# Patient Record
Sex: Female | Born: 1945
Health system: Southern US, Community
[De-identification: ages and names within clinical notes are randomized; demographics above are authoritative.]

## PROBLEM LIST (undated history)

## (undated) ENCOUNTER — Emergency Department: Admission: EM | Payer: Medicare Other | Source: Home / Self Care

## (undated) DIAGNOSIS — C50919 Malignant neoplasm of unspecified site of unspecified female breast: Secondary | ICD-10-CM

## (undated) DIAGNOSIS — I10 Essential (primary) hypertension: Secondary | ICD-10-CM

## (undated) DIAGNOSIS — R42 Dizziness and giddiness: Secondary | ICD-10-CM

## (undated) DIAGNOSIS — R55 Syncope and collapse: Secondary | ICD-10-CM

## (undated) DIAGNOSIS — R519 Headache, unspecified: Secondary | ICD-10-CM

## (undated) DIAGNOSIS — E785 Hyperlipidemia, unspecified: Secondary | ICD-10-CM

## (undated) DIAGNOSIS — K219 Gastro-esophageal reflux disease without esophagitis: Secondary | ICD-10-CM

## (undated) DIAGNOSIS — I208 Other forms of angina pectoris: Secondary | ICD-10-CM

## (undated) DIAGNOSIS — M545 Low back pain, unspecified: Secondary | ICD-10-CM

## (undated) DIAGNOSIS — I2089 Other forms of angina pectoris: Secondary | ICD-10-CM

## (undated) HISTORY — PX: HYSTERECTOMY: SHX81

## (undated) HISTORY — DX: Hyperlipidemia, unspecified: E78.5

## (undated) HISTORY — PX: UTERINE FIBROID SURGERY: SHX826

## (undated) HISTORY — PX: ABDOMINAL HYSTERECTOMY: SHX81

---

## 1998-11-17 ENCOUNTER — Emergency Department: Admit: 1998-11-17 | Payer: Self-pay | Admitting: Emergency Medicine

## 2001-09-16 ENCOUNTER — Emergency Department: Admit: 2001-09-16 | Payer: Self-pay | Source: Emergency Department | Admitting: Emergency Medicine

## 2003-01-21 ENCOUNTER — Ambulatory Visit
Admit: 2003-01-21 | Disposition: A | Discharge status: Home / Self Care | Payer: Self-pay | Source: Ambulatory Visit | Admitting: Cardiovascular Disease

## 2004-01-23 ENCOUNTER — Ambulatory Visit
Admit: 2004-01-23 | Disposition: A | Discharge status: Home / Self Care | Payer: Self-pay | Source: Ambulatory Visit | Admitting: Family Medicine

## 2004-04-29 ENCOUNTER — Ambulatory Visit
Admit: 2004-04-29 | Disposition: A | Discharge status: Home / Self Care | Payer: Self-pay | Source: Ambulatory Visit | Admitting: Internal Medicine

## 2004-05-01 ENCOUNTER — Ambulatory Visit
Admit: 2004-05-01 | Disposition: A | Discharge status: Home / Self Care | Payer: Self-pay | Source: Ambulatory Visit | Admitting: Internal Medicine

## 2004-08-19 ENCOUNTER — Ambulatory Visit
Admit: 2004-08-19 | Disposition: A | Discharge status: Home / Self Care | Payer: Self-pay | Source: Ambulatory Visit | Admitting: Internal Medicine

## 2004-12-15 ENCOUNTER — Ambulatory Visit
Admit: 2004-12-15 | Disposition: A | Discharge status: Home / Self Care | Payer: Self-pay | Source: Ambulatory Visit | Admitting: Internal Medicine

## 2005-04-26 ENCOUNTER — Ambulatory Visit
Admit: 2005-04-26 | Disposition: A | Discharge status: Home / Self Care | Payer: Self-pay | Source: Ambulatory Visit | Admitting: Internal Medicine

## 2006-01-10 ENCOUNTER — Ambulatory Visit
Admit: 2006-01-10 | Disposition: A | Discharge status: Home / Self Care | Payer: Self-pay | Source: Ambulatory Visit | Admitting: Internal Medicine

## 2006-05-09 ENCOUNTER — Ambulatory Visit
Admit: 2006-05-09 | Disposition: A | Discharge status: Home / Self Care | Payer: Self-pay | Source: Ambulatory Visit | Admitting: Internal Medicine

## 2008-11-24 ENCOUNTER — Ambulatory Visit
Admit: 2008-11-24 | Disposition: A | Discharge status: Home / Self Care | Payer: Self-pay | Source: Ambulatory Visit | Admitting: Internal Medicine

## 2009-03-23 ENCOUNTER — Ambulatory Visit
Admit: 2009-03-23 | Disposition: A | Discharge status: Home / Self Care | Payer: Self-pay | Source: Ambulatory Visit | Admitting: Internal Medicine

## 2009-04-16 ENCOUNTER — Ambulatory Visit
Admit: 2009-04-16 | Disposition: A | Discharge status: Home / Self Care | Payer: Self-pay | Source: Ambulatory Visit | Admitting: Internal Medicine

## 2009-07-15 ENCOUNTER — Emergency Department: Admit: 2009-07-15 | Payer: Self-pay | Source: Emergency Department | Admitting: Emergency Medicine

## 2009-07-15 LAB — COMPREHENSIVE METABOLIC PANEL
ALT: 28 U/L (ref 21–72)
AST (SGOT): 25 U/L (ref 8–39)
Albumin/Globulin Ratio: 1.4 (ref 1.1–1.8)
Albumin: 4.3 G/DL (ref 3.7–5.1)
Alkaline Phosphatase: 99 U/L (ref 43–122)
BUN: 12 MG/DL (ref 7–21)
Bilirubin, Total: <0.1 MG/DL (ref 0.2–1.3)
CO2: 31 MEQ/L (ref 22–31)
Calcium: 9.8 MG/DL (ref 8.6–10.2)
Chloride: 104 MEQ/L (ref 98–107)
Creatinine: 0.8 MG/DL (ref 0.5–1.4)
Globulin: 3 G/DL (ref 2.0–3.7)
Glucose: 108 MG/DL — ABNORMAL HIGH (ref 70–105)
Potassium: 4.4 MEQ/L (ref 3.6–5.0)
Protein, Total: 7.3 G/DL (ref 6.0–8.0)
Sodium: 140 MEQ/L (ref 136–143)

## 2009-07-15 LAB — CBC WITH MANUAL DIFFERENTIAL
Atypical Lymph %: 2
Atypical Lymph Absolute: 0.15
Basophils %: 1 % (ref 0–2)
Basophils Absolute Manual: 0.07
Eosinophils %: 0 % (ref 0–5)
Eosinophils Absolute Manual: 0
Granulocytes #: 4.49
Hematocrit: 41.9 % (ref 37.0–47.0)
Hgb: 13.2 G/DL (ref 12.0–16.0)
LYMPH#: 2.4
Lymphocytes Manual: 32 % (ref 15–41)
MCH: 27.2 PG — ABNORMAL LOW (ref 28.0–32.0)
MCHC: 31.5 G/DL — ABNORMAL LOW (ref 32.0–36.0)
MCV: 86.4 FL (ref 80.0–100.0)
MPV: 12.1 FL (ref 9.4–12.3)
Monocytes Absolute Calculated: 0.37
Monocytes Manual: 5 % (ref 0–11)
Neutrophils %: 60 % (ref 52–75)
Platelets: 319 /mm3 (ref 140–400)
RBC Morphology: NORMAL
RBC: 4.85 /mm3 (ref 4.20–5.40)
RDW: 13.1 % (ref 11.5–15.0)
WBC: 7.49 /mm3 (ref 3.50–10.80)

## 2009-07-15 LAB — TROPONIN I QUANTITATIVE LEVEL CERNER: Troponin I: 0.01 ng/mL (ref 0.00–0.03)

## 2009-07-15 LAB — GFR

## 2009-07-15 LAB — CALCIUM IONIZED-CALC. CERNER: Calcium Ionized Calculated: 2.1 mEQ/L (ref 1.9–2.3)

## 2009-07-15 LAB — CREATINE KINASE W/O REFLEX (SOFT): Creatine Kinase (CK): 78 U/L (ref 20–140)

## 2009-09-04 ENCOUNTER — Ambulatory Visit
Admit: 2009-09-04 | Disposition: A | Discharge status: Home / Self Care | Payer: Self-pay | Source: Ambulatory Visit | Admitting: Internal Medicine

## 2010-09-01 ENCOUNTER — Ambulatory Visit
Admit: 2010-09-01 | Disposition: A | Discharge status: Home / Self Care | Payer: Self-pay | Source: Ambulatory Visit | Admitting: Internal Medicine

## 2011-06-29 LAB — ECG 12-LEAD
Atrial Rate: 55 {beats}/min
P Axis: 45 degrees
P-R Interval: 188 ms
Q-T Interval: 424 ms
QRS Duration: 90 ms
QTC Calculation (Bezet): 405 ms
R Axis: 43 degrees
T Axis: 4 degrees
Ventricular Rate: 55 {beats}/min

## 2011-08-04 ENCOUNTER — Emergency Department: Admit: 2011-08-04 | Disposition: A | Discharge status: Home / Self Care | Payer: Self-pay | Source: Emergency Department

## 2011-08-04 LAB — URINALYSIS, REFLEX TO MICROSCOPIC EXAM IF INDICATED
Bilirubin, UA: NEGATIVE
Blood, UA: NEGATIVE
Glucose, UA: NEGATIVE
Ketones UA: NEGATIVE
Nitrite, UA: NEGATIVE
Protein, UR: NEGATIVE
Specific Gravity UA POCT: 1.005 (ref 1.001–1.035)
Urine pH: 7 (ref 5.0–8.0)
Urobilinogen, UA: NORMAL mg/dL

## 2011-08-04 LAB — CBC AND DIFFERENTIAL
Basophils Absolute Automated: 0.02 10*3/uL (ref 0.00–0.20)
Basophils Automated: 0 % (ref 0–2)
Eosinophils Absolute Automated: 0.23 10*3/uL (ref 0.00–0.70)
Eosinophils Automated: 3 % (ref 0–5)
Hematocrit: 38.4 % (ref 37.0–47.0)
Hgb: 12 g/dL (ref 12.0–16.0)
Immature Granulocytes Absolute: 0.02 10*3/uL
Immature Granulocytes: 0 % (ref 0–1)
Lymphocytes Absolute Automated: 2.98 10*3/uL (ref 0.50–4.40)
Lymphocytes Automated: 34 % (ref 15–41)
MCH: 26.8 pg — ABNORMAL LOW (ref 28.0–32.0)
MCHC: 31.3 g/dL — ABNORMAL LOW (ref 32.0–36.0)
MCV: 85.7 fL (ref 80.0–100.0)
MPV: 11.3 fL (ref 9.4–12.3)
Monocytes Absolute Automated: 0.66 10*3/uL (ref 0.00–1.20)
Monocytes: 8 % (ref 0–11)
Neutrophils Absolute: 4.93 10*3/uL (ref 1.80–8.10)
Neutrophils: 56 % (ref 52–75)
Nucleated RBC: 0 /100 WBC
Platelets: 326 10*3/uL (ref 140–400)
RBC: 4.48 10*6/uL (ref 4.20–5.40)
RDW: 14 % (ref 12–15)
WBC: 8.82 10*3/uL (ref 3.50–10.80)

## 2011-08-04 LAB — COMPREHENSIVE METABOLIC PANEL
ALT: 35 U/L (ref 21–72)
AST (SGOT): 25 U/L (ref 8–39)
Albumin/Globulin Ratio: 1.4 (ref 1.1–1.8)
Albumin: 4.1 g/dL (ref 3.7–5.1)
Alkaline Phosphatase: 115 U/L (ref 43–122)
BUN: 13 mg/dL (ref 7–21)
Bilirubin, Total: 0.4 mg/dL (ref 0.2–1.3)
CO2: 31 mEq/L (ref 22–31)
Calcium: 9.6 mg/dL (ref 8.6–10.2)
Chloride: 102 mEq/L (ref 98–107)
Creatinine: 0.7 mg/dL (ref 0.5–1.4)
Globulin: 3 g/dL (ref 2.0–3.7)
Glucose: 106 mg/dL — ABNORMAL HIGH (ref 70–100)
Potassium: 4.1 mEq/L (ref 3.6–5.0)
Protein, Total: 7.1 g/dL (ref 6.0–8.0)
Sodium: 144 mEq/L — ABNORMAL HIGH (ref 136–143)

## 2011-08-04 LAB — TROPONIN I: Troponin I: <0.01 ng/mL (ref 0.00–0.03)

## 2011-08-04 LAB — GFR: EGFR: >60

## 2011-08-04 LAB — CK: Creatine Kinase (CK): 100 U/L (ref 20–140)

## 2011-08-09 LAB — ECG 12-LEAD
Atrial Rate: 57 {beats}/min
P Axis: 43 degrees
P-R Interval: 174 ms
Q-T Interval: 422 ms
QRS Duration: 92 ms
QTC Calculation (Bezet): 410 ms
R Axis: 50 degrees
T Axis: 9 degrees
Ventricular Rate: 57 {beats}/min

## 2011-10-22 ENCOUNTER — Observation Stay
Admission: EM | Admit: 2011-10-22 | Disposition: A | Discharge status: Home / Self Care | Payer: Self-pay | Source: Emergency Department | Attending: Internal Medicine | Admitting: Internal Medicine

## 2011-10-22 LAB — COMPREHENSIVE METABOLIC PANEL
ALT: 26 U/L (ref 21–72)
AST (SGOT): 27 U/L (ref 8–39)
Albumin/Globulin Ratio: 1.4 (ref 1.1–1.8)
Albumin: 4.4 g/dL (ref 3.7–5.1)
Alkaline Phosphatase: 89 U/L (ref 43–122)
BUN: 12 mg/dL (ref 7–21)
Bilirubin, Total: 0.6 mg/dL (ref 0.2–1.3)
CO2: 32 mEq/L — ABNORMAL HIGH (ref 22–31)
Calcium: 9.7 mg/dL (ref 8.6–10.2)
Chloride: 102 mEq/L (ref 98–107)
Creatinine: 0.7 mg/dL (ref 0.5–1.4)
Globulin: 3.1 g/dL (ref 2.0–3.7)
Glucose: 100 mg/dL (ref 70–100)
Potassium: 4.6 mEq/L (ref 3.6–5.0)
Protein, Total: 7.5 g/dL (ref 6.0–8.0)
Sodium: 140 mEq/L (ref 136–143)

## 2011-10-22 LAB — CBC AND DIFFERENTIAL
Basophils Absolute Automated: 0.02 10*3/uL (ref 0.00–0.20)
Basophils Automated: 0 % (ref 0–2)
Eosinophils Absolute Automated: 0.11 10*3/uL (ref 0.00–0.70)
Eosinophils Automated: 2 % (ref 0–5)
Hematocrit: 40.1 % (ref 37.0–47.0)
Hgb: 12.6 g/dL (ref 12.0–16.0)
Immature Granulocytes Absolute: 0.01 10*3/uL
Immature Granulocytes: 0 % (ref 0–1)
Lymphocytes Absolute Automated: 2.69 10*3/uL (ref 0.50–4.40)
Lymphocytes Automated: 38 % (ref 15–41)
MCH: 27.2 pg — ABNORMAL LOW (ref 28.0–32.0)
MCHC: 31.4 g/dL — ABNORMAL LOW (ref 32.0–36.0)
MCV: 86.6 fL (ref 80.0–100.0)
MPV: 11.5 fL (ref 9.4–12.3)
Monocytes Absolute Automated: 0.48 10*3/uL (ref 0.00–1.20)
Monocytes: 7 % (ref 0–11)
Neutrophils Absolute: 3.85 10*3/uL (ref 1.80–8.10)
Neutrophils: 54 % (ref 52–75)
Nucleated RBC: 0 /100 WBC
Platelets: 360 10*3/uL (ref 140–400)
RBC: 4.63 10*6/uL (ref 4.20–5.40)
RDW: 14 % (ref 12–15)
WBC: 7.15 10*3/uL (ref 3.50–10.80)

## 2011-10-22 LAB — TROPONIN I
Troponin I: 0.01 ng/mL (ref 0.00–0.03)
Troponin I: 0.01 ng/mL (ref 0.00–0.03)

## 2011-10-22 LAB — GFR: EGFR: >60

## 2011-10-23 LAB — ECG 12-LEAD
Atrial Rate: 53 {beats}/min
Atrial Rate: 48 {beats}/min
P Axis: 39 degrees
P Axis: 39 degrees
P-R Interval: 162 ms
P-R Interval: 198 ms
Q-T Interval: 448 ms
Q-T Interval: 444 ms
QRS Duration: 96 ms
QRS Duration: 96 ms
QTC Calculation (Bezet): 420 ms
QTC Calculation (Bezet): 396 ms
R Axis: 42 degrees
R Axis: 40 degrees
T Axis: 19 degrees
T Axis: 22 degrees
Ventricular Rate: 53 {beats}/min
Ventricular Rate: 48 {beats}/min

## 2011-10-24 LAB — URINALYSIS WITH MICROSCOPIC
Bilirubin, UA: NEGATIVE
Blood, UA: NEGATIVE
Glucose, UA: NEGATIVE
Ketones UA: 25 — AB
Nitrite, UA: NEGATIVE
Protein, UR: NEGATIVE
Specific Gravity UA POCT: 1.02 (ref 1.001–1.035)
Urine pH: 6 (ref 5.0–8.0)
Urobilinogen, UA: NORMAL mg/dL

## 2011-10-24 LAB — TSH: TSH: 0.283 u[IU]/mL — ABNORMAL LOW (ref 0.465–4.680)

## 2011-11-07 NOTE — H&P (Signed)
Emily [name], Emily [name]                                        MRN:          [id number]                                                          Account:      [id number]                                         Document ID:  [id number]                                                                                                                                        Admit Date: 10/22/2011     Patient Location: [id number]  Patient Type: V     ATTENDING PHYSICIAN: Neta Mends, MD        CHIEF COMPLAINT:  Chest pain started a couple of days ago.     HISTORY OF PRESENT ILLNESS:  This is a 66 year old female with a past medical history of hypertension,  hyperlipidemia, hypothyroidism, presented to the emergency room for the  complaint of chest pain that started a couple of days ago , pain in the  midsternal area.  Intensity about 6/10 and pressure like, intermittent in  nature, but yesterday the chest pain getting worse, the patient came to the  emergency room, also associated with shortness of breath and nauseated too.   Other than that, no fever, no abdominal pain, no dysuria at this time.  From the emergency room, the patient was admitted to telemetry cardiac  monitor unit to rule out acute coronary syndrome.  Cardiology consultation  requested too.     ALLERGIES:  MORPHINE SULFATE.     HOME MEDICATIONS:  1.  Lisinopril 10 mg p.o. daily.  2.  Simvastatin 40 mg p.o. daily.  3.  MV 1 tablet p.o. daily.     PAST MEDICAL HISTORY:  1.  Hypertension.  2.  Hyperlipidemia.  3.  Hypothyroidism.     PAST SURGICAL HISTORY:  Status post appendectomy in the past and history of hysterectomy in the  past.     FAMILY HISTORY:  Noncontributory.     SOCIAL HISTORY:  No tobacco, no alcohol, no recreational drugs.  Page 1 of 3  Emily [name], Emily [name]                                        MRN:          [id number]                                                           Account:      [id number]                                         Document ID:  [id number]                                                                                                                                           REVIEW OF SYSTEMS:  This is a 66 year old female admitted for chest pain, other than that, no  fever and no dysuria at this time.     PHYSICAL EXAMINATION:  GENERAL:  The patient lying on the bed, not in distress.  VITAL SIGNS:  Blood pressure 163/73, heart rate 49, respirations 16,  temperature 97.8, oxygen saturation 100% on 2 liters.  HEENT:  Pupils round and reactive to light.  Nose:  No discharge.  Mouth:  Mucous membranes moist.  NECK:  Supple, no JVD, no carotid bruits.  SKIN:  No rash.  No cyanosis.  LUNGS:  Good air entry bilaterally.  No significant expiratory wheezing.  CARDIOVASCULAR:  S1, S2.  ABDOMEN:  Soft, bowel sounds present.  No mass palpable.  No rebound  tenderness.  RECTAL:  Refused.  EXTREMITIES:  No edema.     LABORATORY DATA:  WBC 7.15, hemoglobin 12.2, hematocrit 40.1, MCV 86.6, platelets 360.  Sodium 142, potassium 4.6, chloride 102, bicarbonate 32, BUN 12, creatinine  0.7, glucose 100.  Troponin 0.01.     Chest x-ray:  No acute process.     IMPRESSION:  1.  Chest pain to rule out acute coronary syndrome.  2.  Hypertension.  3.  Hyperlipidemia.  4.  Hypothyroidism.     PLAN:  This is a 66 year old female, history of hypertension, hyperlipidemia,  admitted for chest pain.  Plan to admit to telemetry cardiac monitor unit.  We will send serial cardiac enzymes for 2 to 3 times and will follow.  Cardiology consultation requested.  We will follow.  Plan to do a cardiac  stress test today too.  Further management depends on the patient's  clinical course.  Electronic Signing Provider                                                                                                              Page 2 of 3  Emily [name], Emily [name]                                         MRN:          [id number]                                                          Account:      [id number]                                         Document ID:  [id number]                                                                                                                                        D:  10/23/2011 08:15 AM by Dr. Marcelino Freestone, MD ([id number])  T:  10/23/2011 11:20 AM by [id number]        cc:                                                                                                           Page 3 of 3  Authenticated and Edited by Marcelino Freestone, MD ([id number]) On 11/07/11 1:52:58 PM

## 2011-11-21 NOTE — Discharge Summary (Signed)
Emily [name], Emily [name]                                          MRN:          [id number]                                                          Account:      [id number]                                         Document ID:  [id number]                                                                                                                                        Admit Date: 10/22/2011  Discharge Date: 10/25/2011     ATTENDING PHYSICIAN:  Karrie Meres, MD        DISCHARGE DIAGNOSES:  Chest pain, myocardial infarction ruled out, urinary tract infection,  hypothyroidism, hypertension, dyslipidemia, and possible gastroesophageal  reflux disease.     HISTORY OF PRESENT ILLNESS:  The patient is a 66 year old lady who came to the hospital because of  having midsternal chest discomfort.  The patient said that she has had  chest discomfort for the last couple of days it was 6/10 in severity,  pressure-like, intermittent in nature, associated with some nausea as well.   She was admitted for further evaluation and management.     HOSPITAL COURSE:  During the hospital course, patient initially had laboratory tests done,  which showed white count of 7, H and H 12/40, platelets 360, BUN 12,  creatinine 0.7.  Chest x-ray was negative.  The patient had a cardiac  stress test done, which was negative.  The patient still continued to  complain of weakness.  The patient's TSH was done, which was low,  therefore, Synthroid was decreased to 25 daily from 50 daily.  The  patient's UA also showed some leukesterase; therefore, she has been put on  Macrobid twice a day.  The patient said that she feels better and that she  would be discharged home.     DISCHARGE MEDICATIONS:  Lisinopril 10 daily, simvastatin 40 daily, Synthroid 25 daily, Pepcid 20  daily, Macrobid 100 twice a day for 7 days.     DISCHARGE INSTRUCTIONS:  The patient will get TSH checked in 4 weeks and will follow up with PMD in  2 weeks.  D:  10/25/2011 11:59  AM by Dr. Debera Lat. Clayborn Heron, MD ([id number])  T:  10/25/2011 12:34 PM by [id number]                                                                                                                 Page 1 of 2  Emily, [name]                                          MRN:          [id number]                                                          Account:      [id number]                                         Document ID:  [id number]                                                                                                                                           cc:                                                                                                           Page 2 of 2  Authenticated by Debera Lat. Clayborn Heron, MD ([id number]) On 11/21/2011 03:44:46 PM

## 2011-12-16 ENCOUNTER — Ambulatory Visit
Admit: 2011-12-16 | Discharge: 2011-12-16 | Disposition: A | Discharge status: Home / Self Care | Payer: Self-pay | Source: Ambulatory Visit

## 2012-10-03 DIAGNOSIS — C50919 Malignant neoplasm of unspecified site of unspecified female breast: Secondary | ICD-10-CM

## 2012-10-03 HISTORY — DX: Malignant neoplasm of unspecified site of unspecified female breast: C50.919

## 2012-11-05 ENCOUNTER — Other Ambulatory Visit: Payer: Self-pay | Admitting: Family Medicine

## 2012-11-05 DIAGNOSIS — Z1231 Encounter for screening mammogram for malignant neoplasm of breast: Secondary | ICD-10-CM

## 2012-11-09 ENCOUNTER — Ambulatory Visit: Admission: RE | Admit: 2012-11-09 | Payer: Commercial Managed Care - POS | Source: Ambulatory Visit

## 2013-08-12 ENCOUNTER — Other Ambulatory Visit: Payer: Self-pay | Admitting: Family Medicine

## 2013-08-12 DIAGNOSIS — Z1231 Encounter for screening mammogram for malignant neoplasm of breast: Secondary | ICD-10-CM

## 2013-08-14 ENCOUNTER — Ambulatory Visit
Admission: RE | Admit: 2013-08-14 | Discharge: 2013-08-14 | Disposition: A | Discharge status: Home / Self Care | Payer: Commercial Managed Care - POS | Source: Ambulatory Visit | Attending: Family Medicine | Admitting: Family Medicine

## 2013-08-14 DIAGNOSIS — R920 Mammographic microcalcification found on diagnostic imaging of breast: Secondary | ICD-10-CM | POA: Insufficient documentation

## 2013-08-14 DIAGNOSIS — Z1231 Encounter for screening mammogram for malignant neoplasm of breast: Secondary | ICD-10-CM

## 2013-08-15 ENCOUNTER — Other Ambulatory Visit: Payer: Self-pay | Admitting: Family Medicine

## 2013-08-15 ENCOUNTER — Telehealth: Payer: Self-pay | Admitting: Family Medicine

## 2013-08-15 DIAGNOSIS — R920 Mammographic microcalcification found on diagnostic imaging of breast: Secondary | ICD-10-CM

## 2013-08-26 ENCOUNTER — Other Ambulatory Visit: Payer: Commercial Managed Care - POS

## 2013-08-27 ENCOUNTER — Ambulatory Visit: Payer: Medicare Other

## 2013-09-02 ENCOUNTER — Other Ambulatory Visit: Payer: Self-pay | Admitting: Family Medicine

## 2013-09-02 ENCOUNTER — Ambulatory Visit
Admission: RE | Admit: 2013-09-02 | Discharge: 2013-09-02 | Disposition: A | Discharge status: Home / Self Care | Payer: Commercial Managed Care - POS | Source: Ambulatory Visit | Attending: Family Medicine | Admitting: Family Medicine

## 2013-09-02 ENCOUNTER — Ambulatory Visit: Payer: Self-pay

## 2013-09-02 DIAGNOSIS — R920 Mammographic microcalcification found on diagnostic imaging of breast: Secondary | ICD-10-CM

## 2013-09-02 DIAGNOSIS — N63 Unspecified lump in unspecified breast: Secondary | ICD-10-CM

## 2013-09-02 MED ORDER — LIDOCAINE HCL 2 % IJ SOLN
INTRAMUSCULAR | Status: AC
Start: 2013-09-02 — End: 2013-09-02
  Administered 2013-09-02: 10 mL via INTRAMUSCULAR
  Filled 2013-09-02: qty 40

## 2013-09-04 LAB — LAB USE ONLY - HISTORICAL SURGICAL PATHOLOGY

## 2013-09-24 ENCOUNTER — Other Ambulatory Visit: Payer: Self-pay | Admitting: Surgery

## 2013-09-24 DIAGNOSIS — C50919 Malignant neoplasm of unspecified site of unspecified female breast: Secondary | ICD-10-CM

## 2013-10-03 HISTORY — PX: MASTECTOMY: SHX3

## 2014-04-17 ENCOUNTER — Inpatient Hospital Stay: Payer: Commercial Managed Care - POS | Admitting: Family Medicine

## 2014-04-17 ENCOUNTER — Emergency Department: Payer: Commercial Managed Care - POS

## 2014-04-17 ENCOUNTER — Inpatient Hospital Stay
Admission: EM | Admit: 2014-04-17 | Discharge: 2014-04-23 | DRG: 092 | Disposition: A | Discharge status: Home / Self Care | Payer: Commercial Managed Care - POS | Attending: Internal Medicine | Admitting: Internal Medicine

## 2014-04-17 DIAGNOSIS — Z7982 Long term (current) use of aspirin: Secondary | ICD-10-CM

## 2014-04-17 DIAGNOSIS — N61 Mastitis without abscess: Secondary | ICD-10-CM | POA: Diagnosis present

## 2014-04-17 DIAGNOSIS — I10 Essential (primary) hypertension: Secondary | ICD-10-CM | POA: Diagnosis present

## 2014-04-17 DIAGNOSIS — Z853 Personal history of malignant neoplasm of breast: Secondary | ICD-10-CM

## 2014-04-17 DIAGNOSIS — N390 Urinary tract infection, site not specified: Secondary | ICD-10-CM | POA: Diagnosis present

## 2014-04-17 DIAGNOSIS — T8140XA Infection following a procedure, unspecified, initial encounter: Secondary | ICD-10-CM | POA: Diagnosis present

## 2014-04-17 DIAGNOSIS — G929 Unspecified toxic encephalopathy: Principal | ICD-10-CM | POA: Diagnosis present

## 2014-04-17 DIAGNOSIS — E519 Thiamine deficiency, unspecified: Secondary | ICD-10-CM | POA: Diagnosis present

## 2014-04-17 DIAGNOSIS — E039 Hypothyroidism, unspecified: Secondary | ICD-10-CM | POA: Diagnosis present

## 2014-04-17 DIAGNOSIS — K219 Gastro-esophageal reflux disease without esophagitis: Secondary | ICD-10-CM | POA: Diagnosis present

## 2014-04-17 DIAGNOSIS — D649 Anemia, unspecified: Secondary | ICD-10-CM | POA: Diagnosis present

## 2014-04-17 DIAGNOSIS — R4182 Altered mental status, unspecified: Secondary | ICD-10-CM

## 2014-04-17 DIAGNOSIS — E785 Hyperlipidemia, unspecified: Secondary | ICD-10-CM | POA: Diagnosis present

## 2014-04-17 HISTORY — DX: Essential (primary) hypertension: I10

## 2014-04-17 LAB — CBC AND DIFFERENTIAL
Basophils Absolute Automated: 0.02 10*3/uL (ref 0.00–0.20)
Basophils Automated: 0 %
Eosinophils Absolute Automated: 0 10*3/uL (ref 0.00–0.70)
Eosinophils Automated: 0 %
Hematocrit: 30.1 % — ABNORMAL LOW (ref 37.0–47.0)
Hgb: 9.2 g/dL — ABNORMAL LOW (ref 12.0–16.0)
Immature Granulocytes Absolute: 0.04 10*3/uL
Immature Granulocytes: 0 %
Lymphocytes Absolute Automated: 2.15 10*3/uL (ref 0.50–4.40)
Lymphocytes Automated: 24 %
MCH: 26.1 pg — ABNORMAL LOW (ref 28.0–32.0)
MCHC: 30.6 g/dL — ABNORMAL LOW (ref 32.0–36.0)
MCV: 85.5 fL (ref 80.0–100.0)
MPV: 10 fL (ref 9.4–12.3)
Monocytes Absolute Automated: 0.75 10*3/uL (ref 0.00–1.20)
Monocytes: 8 %
Neutrophils Absolute: 6.12 10*3/uL (ref 1.80–8.10)
Neutrophils: 68 %
Nucleated RBC: 0 /100 WBC (ref 0–1)
Platelets: 496 10*3/uL — ABNORMAL HIGH (ref 140–400)
RBC: 3.52 10*6/uL — ABNORMAL LOW (ref 4.20–5.40)
RDW: 14 % (ref 12–15)
WBC: 9.04 10*3/uL (ref 3.50–10.80)

## 2014-04-17 LAB — URINALYSIS, REFLEX TO MICROSCOPIC EXAM IF INDICATED
Bilirubin, UA: NEGATIVE
Blood, UA: NEGATIVE
Glucose, UA: NEGATIVE
Ketones UA: NEGATIVE
Nitrite, UA: NEGATIVE
Protein, UR: NEGATIVE
Specific Gravity UA: 1.004 (ref 1.001–1.035)
Urine pH: 7 (ref 5.0–8.0)
Urobilinogen, UA: NEGATIVE mg/dL

## 2014-04-17 LAB — LIPASE: Lipase: 24 U/L (ref 8–78)

## 2014-04-17 LAB — COMPREHENSIVE METABOLIC PANEL
ALT: 25 U/L (ref 0–55)
AST (SGOT): 36 U/L — ABNORMAL HIGH (ref 5–34)
Albumin/Globulin Ratio: 1.1 (ref 0.9–2.2)
Albumin: 3.4 g/dL — ABNORMAL LOW (ref 3.5–5.0)
Alkaline Phosphatase: 87 U/L (ref 37–106)
BUN: 11 mg/dL (ref 7–19)
Bilirubin, Total: 0.2 mg/dL (ref 0.2–1.2)
CO2: 33 mEq/L — ABNORMAL HIGH (ref 22–29)
Calcium: 9.3 mg/dL (ref 8.5–10.5)
Chloride: 101 mEq/L (ref 100–111)
Creatinine: 0.7 mg/dL (ref 0.6–1.0)
Globulin: 3.1 g/dL (ref 2.0–3.6)
Glucose: 104 mg/dL — ABNORMAL HIGH (ref 70–100)
Potassium: 4.3 mEq/L (ref 3.5–5.1)
Protein, Total: 6.5 g/dL (ref 6.0–8.3)
Sodium: 143 mEq/L (ref 136–145)

## 2014-04-17 LAB — MAGNESIUM: Magnesium: 2.2 mg/dL (ref 1.6–2.6)

## 2014-04-17 LAB — TROPONIN I: Troponin I: <0.01 ng/mL (ref 0.00–0.09)

## 2014-04-17 LAB — APTT: PTT: 34 s (ref 23–37)

## 2014-04-17 LAB — PT/INR
PT INR: 1 (ref 0.9–1.1)
PT: 12.9 s (ref 12.6–15.0)

## 2014-04-17 LAB — GFR: EGFR: >60

## 2014-04-17 LAB — LACTIC ACID, PLASMA: Lactic Acid: 1.2 mmol/L (ref 0.5–2.2)

## 2014-04-17 MED ORDER — LEVOFLOXACIN IN D5W 500 MG/100ML IV SOLN
500.0000 mg | INTRAVENOUS | Status: DC
Start: 2014-04-18 — End: 2014-04-19
  Administered 2014-04-18: 500 mg via INTRAVENOUS
  Filled 2014-04-17 (×2): qty 100

## 2014-04-17 MED ORDER — ASPIRIN 81 MG PO TBEC
81.0000 mg | DELAYED_RELEASE_TABLET | Freq: Every day | ORAL | Status: DC
Start: 2014-04-17 — End: 2014-04-23
  Administered 2014-04-18 – 2014-04-23 (×7): 81 mg via ORAL
  Filled 2014-04-17 (×7): qty 1

## 2014-04-17 MED ORDER — VANCOMYCIN HCL 1000 MG IV SOLR
1000.0000 mg | Freq: Two times a day (BID) | INTRAVENOUS | Status: DC
Start: 2014-04-17 — End: 2014-04-22
  Administered 2014-04-17 – 2014-04-22 (×10): 1000 mg via INTRAVENOUS
  Filled 2014-04-17 (×12): qty 1000

## 2014-04-17 MED ORDER — VANCOMYCIN HCL 1000 MG IV SOLR
1000.0000 mg | Freq: Once | INTRAVENOUS | Status: DC
Start: 2014-04-17 — End: 2014-04-17

## 2014-04-17 MED ORDER — POTASSIUM CHLORIDE IN NACL 20-0.9 MEQ/L-% IV SOLN
INTRAVENOUS | Status: DC
Start: 2014-04-17 — End: 2014-04-21
  Administered 2014-04-18 – 2014-04-19 (×2): 75 mL/h via INTRAVENOUS

## 2014-04-17 MED ORDER — LEVOFLOXACIN IN D5W 500 MG/100ML IV SOLN
500.0000 mg | Freq: Once | INTRAVENOUS | Status: AC
Start: 2014-04-17 — End: 2014-04-17
  Administered 2014-04-17: 500 mg via INTRAVENOUS
  Filled 2014-04-17: qty 100

## 2014-04-17 NOTE — ED Notes (Signed)
Pt A&O X3.VSS.Pt transferred to unit 6B

## 2014-04-17 NOTE — ED Notes (Signed)
breast surgery on Sunday to remove expanders due to possible infection then pt started having memory problems and was sent here to be checked for UTI, blood clot and or mental evaluation

## 2014-04-17 NOTE — ED Notes (Signed)
Attempted IV x2. No success. RN notified.

## 2014-04-17 NOTE — ED Provider Notes (Signed)
Physician/Midlevel provider first contact with patient: 04/17/14 1718         EMERGENCY DEPARTMENT NOTE    Physician/Midlevel provider first contact with patient: 04/17/14 1718         HISTORY OF PRESENT ILLNESS   Historian:Patient  Translator Used: No    68 y.o. female h/o htn, breast cancer brought here by daughter and son because of memory loss since surgery to remove right breast expanders at [facility] 4 days ago. No fever. Patient has not been taking any medications except for asa. Patient could not remember why she had the surgery, but no other focal deficits.     I spoke with Dr. Chelsea Aus, plastic surgeon who performed the surgery 4 days ago at [facility], per him pt should have started on cipro and Duricef po as outpatient, but per family, she has not filled any of the discharge prescriptions because they were "narcotics." Additionally, pt's wound culture from surgery were normal and her previous blood cultures had grown Serratia which was sensitive to cipro. Rubye Strohmeyer can be reached at [phone].     1. Location of symptoms: neuro  2. Onset of symptoms: 4 days  3. What was patient doing when symptoms started (Context): s/p surgery  4. Severity: moderate  5. Timing: constant  6. Activities that worsen symptoms: none  7. Activities that improve symptoms: none  8. Quality:   9. Radiation of symptoms:  10. Associated signs and Symptoms: memory loss  11. Are symptoms worsening?no  MEDICAL HISTORY     Past Medical History:  Past Medical History   Diagnosis Date   . Hypertension    . Breast cancer in female, unspecified laterality        Past Surgical History:  History reviewed. No pertinent past surgical history.    Social History:  History     Social History   . Marital Status: Widowed     Spouse Name: N/A     Number of Children: N/A   . Years of Education: N/A     Occupational History   . Not on file.     Social History Main Topics   . Smoking status: Never Smoker    . Smokeless tobacco: Not on file   .  Alcohol Use: No   . Drug Use: Not on file   . Sexual Activity: Not on file     Other Topics Concern   . Not on file     Social History Narrative       Family History:  Family History   Problem Relation Age of Onset   . Breast cancer Neg Hx        Outpatient Medication:  Current Discharge Medication List      CONTINUE these medications which have NOT CHANGED    Details   lisinopril (PRINIVIL,ZESTRIL) 10 MG tablet Take 10 mg by mouth daily.      omeprazole (PRILOSEC) 10 MG capsule Take 20 mg by mouth daily.      aspirin 81 MG EC tablet Take 81 mg by mouth daily.      oxyCODONE (OXY-IR) 5 MG capsule Take 5 mg by mouth every 6 (six) hours as needed.               REVIEW OF SYSTEMS   ADD ROS  Review of Systems   Psychiatric/Behavioral: Positive for memory loss.   All other systems reviewed and are negative.          PHYSICAL EXAM  Filed Vitals:    04/17/14 2141   BP: 142/67   Pulse: 80   Temp: 97.8 F (36.6 C)   Resp: 18   SpO2: 100%     Nursing note and vitals reviewed.  Constitutional:  Well developed, well nourished.   Head:  Atraumatic. Normocephalic.    Eyes:  PERRL. EOMI. Conjunctivae are not pale.  ENT:  Mucous membranes are moist and intact. Oropharynx is clear and symmetric.  Patent airway.  Neck:  Supple. Full ROM.    Cardiovascular:  Regular rate. Regular rhythm. No murmurs, rubs, or gallops.  Respiratory:  No evidence of respiratory distress. Clear to auscultation bilaterally.  right breast surgical site, intact, tender, no drainage, JP drain has about 15 cc of serosanguinous fluid.   GI:  Soft and non-distended. There is no tenderness. No rebound, guarding, or rigidity.  Back:  Full ROM. Nontender.  MSK:  No edema. No cyanosis. No clubbing. Full range of motion in all extremities.  Skin:  Skin is warm and dry.  No diaphoresis. No rash.   Neurological:  Alert, awake, and appropriate. Oriented to person, place, but not time. Normal speech. Motor normal.  Psychiatric:  Good eye contact. Normal interaction,  affect, and behavior.        MEDICAL DECISION MAKING     DISCUSSION      R/o ich, r/o electrolyte, r/o infection, will cover with abx      tpa considered, outside of window and unlikely cva      Vital Signs: Reviewed the patient?s vital signs.   Nursing Notes: Reviewed and utilized available nursing notes.  Medical Records Reviewed: Reviewed available past medical records.      PROCEDURES        CARDIAC STUDIES    The following cardiac studies were independently interpreted by the Emergency Medicine Physician.  For full cardiac study results please see chart.    Monitor Strip  Interpreted by ED Physician  Rate: 80  Rhythm: SR      EKG Interpretation:    17:54 NSR at 83 bpm, NAD, no LVH, qrs 88, qtc 430 (-) ST-T changes similar to ekg on 10/23/2011 as read by me.     IMAGING STUDIES    The following imaging studies were independently interpreted by the Emergency Medicine Physician.  For full imaging study results please see chart.    CT Head without Contrast   Final Result   HISTORY: Mental status change      EXAMINATION AND TECHNIQUE: Unenhanced CT examination of brain performed.         COMPARISON: None available      FINDINGS: No mass-effect or midline shift on these unenhanced images.   Ventricular size within normal limits. No CT evidence of acute   intra-axial fluid collections to suggest acute hemorrhage. Osseous   structures are intact. The visualized paranasal sinuses are clear.       IMPRESSION:     No CT evidence of acute process.      Ivin Poot, MD    04/17/2014 7:59 PM         XR Chest AP Portable   Final Result   HISTORY: Mental status change with clinical concern for pneumonia      EXAMINATION: Portable frontal view of chest at 1757      COMPARISON: 10/22/2011       FINDINGS: Patient partially rotated to the right. Surgical clips are   present overlying the right mid to lower hemithorax.  Thin radiopaque   tubular device overlies this region, likely external to the patient. No   focal consolidation or  effusions. No acute edema. Cardiac silhouette   enlarged but stable. No other interval change.      IMPRESSION:        1. Postsurgical changes of right chest as discussed above.   2. No radiographic evidence of pneumonia.      Ivin Poot, MD    04/17/2014 6:08 PM                PULSE OXIMETRY    Oxygen Saturation by Pulse Oximetry: 100%  Interventions: none  Interpretation: normal    EMERGENCY DEPT. MEDICATIONS      ED Medication Orders    Start     Status Ordering Provider    04/17/14 1931  levofloxacin (LEVAQUIN) 500mg  in D5W IVPB (premix)   Once     Route: Intravenous  Ordered Dose: 500 mg     Last MAR action:  Stopped Sharniece Gibbon SHU    04/17/14 1931     Once,   Status:  Discontinued     Route: Intravenous  Ordered Dose: 1,000 mg     Discontinued Halena Mohar SHU          LABORATORY RESULTS    Ordered and independently interpreted AVAILABLE laboratory tests. Please see results section in chart for full details.  Results for orders placed or performed during the hospital encounter of 04/17/14   CBC and differential   Result Value Ref Range    WBC 9.04 3.50 - 10.80 x10 3/uL    RBC 3.52 (L) 4.20 - 5.40 x10 6/uL    Hgb 9.2 (L) 12.0 - 16.0 g/dL    Hematocrit 16.1 (L) 37.0 - 47.0 %    MCV 85.5 80.0 - 100.0 fL    MCH 26.1 (L) 28.0 - 32.0 pg    MCHC 30.6 (L) 32.0 - 36.0 g/dL    RDW 14 12 - 15 %    Platelets 496 (H) 140 - 400 x10 3/uL    MPV 10.0 9.4 - 12.3 fL    Neutrophils 68 None %    Lymphocytes Automated 24 None %    Monocytes 8 None %    Eosinophils Automated 0 None %    Basophils Automated 0 None %    Immature Granulocyte 0 None %    Nucleated RBC 0 0 - 1 /100 WBC    Neutrophils Absolute 6.12 1.80 - 8.10 x10 3/uL    Abs Lymph Automated 2.15 0.50 - 4.40 x10 3/uL    Abs Mono Automated 0.75 0.00 - 1.20 x10 3/uL    Abs Eos Automated 0.00 0.00 - 0.70 x10 3/uL    Absolute Baso Automated 0.02 0.00 - 0.20 x10 3/uL    Absolute Immature Granulocyte 0.04 0 x10 3/uL   Comprehensive metabolic panel   Result Value Ref  Range    Glucose 104 (H) 70 - 100 mg/dL    BUN 11 7 - 19 mg/dL    Creatinine 0.7 0.6 - 1.0 mg/dL    Sodium 096 045 - 409 mEq/L    Potassium 4.3 3.5 - 5.1 mEq/L    Chloride 101 100 - 111 mEq/L    CO2 33 (H) 22 - 29 mEq/L    CALCIUM 9.3 8.5 - 10.5 mg/dL    Protein, Total 6.5 6.0 - 8.3 g/dL    Albumin 3.4 (L) 3.5 - 5.0 g/dL  AST (SGOT) 36 (H) 5 - 34 U/L    ALT 25 0 - 55 U/L    Alkaline Phosphatase 87 37 - 106 U/L    Bilirubin, Total 0.2 0.2 - 1.2 mg/dL    Globulin 3.1 2.0 - 3.6 g/dL    Albumin/Globulin Ratio 1.1 0.9 - 2.2   Lipase   Result Value Ref Range    Lipase 24 8 - 78 U/L   UA, Reflex to Microscopic   Result Value Ref Range    Urine Type Clean Catch     Color, UA Straw Clear - Yellow    Clarity, UA Clear Clear - Hazy    Specific Gravity UA 1.004 1.001-1.035    Urine pH 7.0 5.0-8.0    Leukocyte Esterase, UA Small (A) Negative    Nitrite, UA Negative Negative    Protein, UR Negative Negative    Glucose, UA Negative Negative    Ketones UA Negative Negative    Urobilinogen, UA Negative 0.2  -  2.0 mg/dL    Bilirubin, UA Negative Negative    Blood, UA Negative Negative    RBC, UA 0 - 5 0 - 5 /hpf    WBC, UA 6 - 10 (A) 0 - 5 /hpf    Squamous Epithelial Cells, Urine 0 - 5 0 - 25 /hpf    Trans Epithel, UA 0 - 2 0 - 2 /hpf    Urine Bacteria Rare Rare /hpf   Troponin I   Result Value Ref Range    Troponin I <0.01 0.00 - 0.09 ng/mL   APTT   Result Value Ref Range    PTT 34 23 - 37 sec   Prothrombin time/INR   Result Value Ref Range    PT 12.9 12.6 - 15.0 sec    PT INR 1.0 0.9 - 1.1    PT Anticoag. Given Within 48 hrs. None    Magnesium   Result Value Ref Range    Magnesium 2.2 1.6 - 2.6 mg/dL   Lactic acid, plasma   Result Value Ref Range    Lactic acid 1.2 0.5 - 2.2 mmol/L   GFR   Result Value Ref Range    EGFR >60.0        CONSULTATIONS and ED Course      Case discussed with Dr. Francesco Sor, neuro, will consult.     Case discussed with Dr. Bolivar Haw, will admit.     .Patient feels better. I have discussed all testing results and  plan of care with patient. Patient agrees with admission.     ATTESTATIONS      Physician Attestation: I, Dr. Zadie Rhine Veda Arrellano, MD PhD , have been the primary provider for [name] during this Emergency Dept visit and have reviewed the chart documented for accuracy and agree with its content.       DIAGNOSIS      Diagnosis:  Final diagnoses:   Altered mental status, unspecified altered mental status type   UTI (lower urinary tract infection)       Disposition:  ED Disposition    Observation Admitting Physician: BORN, ANA MARIA [[id number]]  Diagnosis: Altered mental status [780.97]  Estimated Length of Stay: < 2 midnights  Tentative Discharge Plan?: Home or Self Care [1]  Patient Class: Observation [104]            Prescriptions:  Current Discharge Medication List      CONTINUE these medications which have NOT CHANGED    Details  lisinopril (PRINIVIL,ZESTRIL) 10 MG tablet Take 10 mg by mouth daily.      omeprazole (PRILOSEC) 10 MG capsule Take 20 mg by mouth daily.      aspirin 81 MG EC tablet Take 81 mg by mouth daily.      oxyCODONE (OXY-IR) 5 MG capsule Take 5 mg by mouth every 6 (six) hours as needed.                 Seville Brick, Zadie Rhine, MD [facility]  04/17/14 [phone]

## 2014-04-18 ENCOUNTER — Inpatient Hospital Stay: Payer: Commercial Managed Care - POS

## 2014-04-18 LAB — CBC AND DIFFERENTIAL
Basophils Absolute Automated: 0.02 10*3/uL (ref 0.00–0.20)
Basophils Automated: 0 %
Eosinophils Absolute Automated: 0.02 10*3/uL (ref 0.00–0.70)
Eosinophils Automated: 0 %
Hematocrit: 28.6 % — ABNORMAL LOW (ref 37.0–47.0)
Hgb: 8.6 g/dL — ABNORMAL LOW (ref 12.0–16.0)
Immature Granulocytes Absolute: 0.02 10*3/uL
Immature Granulocytes: 0 %
Lymphocytes Absolute Automated: 2.23 10*3/uL (ref 0.50–4.40)
Lymphocytes Automated: 27 %
MCH: 25.7 pg — ABNORMAL LOW (ref 28.0–32.0)
MCHC: 30.1 g/dL — ABNORMAL LOW (ref 32.0–36.0)
MCV: 85.4 fL (ref 80.0–100.0)
MPV: 10 fL (ref 9.4–12.3)
Monocytes Absolute Automated: 0.78 10*3/uL (ref 0.00–1.20)
Monocytes: 9 %
Neutrophils Absolute: 5.32 10*3/uL (ref 1.80–8.10)
Neutrophils: 64 %
Nucleated RBC: 0 /100 WBC (ref 0–1)
Platelets: 450 10*3/uL — ABNORMAL HIGH (ref 140–400)
RBC: 3.35 10*6/uL — ABNORMAL LOW (ref 4.20–5.40)
RDW: 14 % (ref 12–15)
WBC: 8.37 10*3/uL (ref 3.50–10.80)

## 2014-04-18 LAB — GFR: EGFR: >60

## 2014-04-18 LAB — COMPREHENSIVE METABOLIC PANEL
ALT: 23 U/L (ref 0–55)
AST (SGOT): 30 U/L (ref 5–34)
Albumin/Globulin Ratio: 1.1 (ref 0.9–2.2)
Albumin: 3 g/dL — ABNORMAL LOW (ref 3.5–5.0)
Alkaline Phosphatase: 75 U/L (ref 37–106)
BUN: 9 mg/dL (ref 7–19)
Bilirubin, Total: 0.2 mg/dL (ref 0.2–1.2)
CO2: 32 mEq/L — ABNORMAL HIGH (ref 22–29)
Calcium: 8.9 mg/dL (ref 8.5–10.5)
Chloride: 107 mEq/L (ref 100–111)
Creatinine: 0.7 mg/dL (ref 0.6–1.0)
Globulin: 2.8 g/dL (ref 2.0–3.6)
Glucose: 106 mg/dL — ABNORMAL HIGH (ref 70–100)
Potassium: 4.6 mEq/L (ref 3.5–5.1)
Protein, Total: 5.8 g/dL — ABNORMAL LOW (ref 6.0–8.3)
Sodium: 145 mEq/L (ref 136–145)

## 2014-04-18 LAB — C-REACTIVE PROTEIN: C-Reactive Protein: 0.7 mg/dL — ABNORMAL HIGH (ref 0.0–0.5)

## 2014-04-18 LAB — SEDIMENTATION RATE: Sed Rate: 54 mm/Hr — ABNORMAL HIGH (ref 0–20)

## 2014-04-18 LAB — VITAMIN B12: Vitamin B-12: 1296 pg/mL — ABNORMAL HIGH (ref 211–911)

## 2014-04-18 LAB — HEMOLYSIS INDEX: Hemolysis Index: 1 (ref 0–18)

## 2014-04-18 MED ORDER — OXYCODONE HCL 5 MG PO TABS
5.0000 mg | ORAL_TABLET | Freq: Four times a day (QID) | ORAL | Status: DC
Start: 2014-04-18 — End: 2014-04-18

## 2014-04-18 MED ORDER — ENOXAPARIN SODIUM 40 MG/0.4ML SC SOLN
40.0000 mg | Freq: Every day | SUBCUTANEOUS | Status: DC
Start: 2014-04-18 — End: 2014-04-23
  Administered 2014-04-18 – 2014-04-23 (×7): 40 mg via SUBCUTANEOUS
  Filled 2014-04-18 (×7): qty 0.4

## 2014-04-18 MED ORDER — GADOBUTROL 1 MMOL/ML IV SOLN
INTRAVENOUS | Status: AC
Start: 2014-04-18 — End: 2014-04-18
  Administered 2014-04-18: 8 mL via INTRAVENOUS
  Filled 2014-04-18: qty 10

## 2014-04-18 MED ORDER — PANTOPRAZOLE SODIUM 40 MG PO TBEC
40.0000 mg | DELAYED_RELEASE_TABLET | Freq: Every morning | ORAL | Status: DC
Start: 2014-04-18 — End: 2014-04-23
  Administered 2014-04-18 – 2014-04-23 (×6): 40 mg via ORAL
  Filled 2014-04-18 (×7): qty 1

## 2014-04-18 MED ORDER — THIAMINE HCL 100 MG/ML IJ SOLN
100.0000 mg | INTRAVENOUS | Status: DC
Start: 2014-04-18 — End: 2014-04-20
  Administered 2014-04-18 – 2014-04-20 (×3): 100 mg via INTRAVENOUS
  Filled 2014-04-18 (×3): qty 1

## 2014-04-18 MED ORDER — THIAMINE HCL 100 MG/ML IJ SOLN
100.0000 mg | Freq: Every day | INTRAMUSCULAR | Status: DC
Start: 2014-04-18 — End: 2014-04-18

## 2014-04-18 MED ORDER — LISINOPRIL 10 MG PO TABS
10.0000 mg | ORAL_TABLET | Freq: Every day | ORAL | Status: DC
Start: 2014-04-18 — End: 2014-04-23
  Administered 2014-04-18 – 2014-04-23 (×6): 10 mg via ORAL
  Filled 2014-04-18 (×6): qty 1

## 2014-04-18 MED ORDER — TRAMADOL HCL 50 MG PO TABS
50.0000 mg | ORAL_TABLET | Freq: Four times a day (QID) | ORAL | Status: DC | PRN
Start: 2014-04-18 — End: 2014-04-19
  Administered 2014-04-18: 50 mg via ORAL
  Filled 2014-04-18: qty 1

## 2014-04-18 NOTE — Plan of Care (Signed)
Problem: Health Promotion  Goal: Knowledge - disease process  Extent of understanding conveyed about a specific disease process.   Outcome: Progressing  Patient was admitted from ED for altered mental status. UPon arrival, patient was found to be axox4 but forgetful at times. Patient and family were oriented to the unit and room accessories, they verbalized understanding. Plan of care was reviewed, patient verbalized understanding. Medicated as ordered. IV fluid started as ordered. Attending Dr. Bolivar Haw was called and discussed about patient's admission status and per MD patient's AMS related to possible infection. Fall precaution in place. Bed exit alarm on. Hourly round was done. Assisted to the bathroom as needed. Pleasant and cooperative with the staff. No confusion but short term memory loss was noted as evidence by patient asked her family why she had surgery and the JP. Patient was reoriented x one. Patient sleeping comfortably. No complaints. Will continue to monitor.

## 2014-04-18 NOTE — H&P (Addendum)
ADMISSION HISTORY AND PHYSICAL EXAM    Date Time: 04/18/2014 9:03 AM  Patient Name: Emily [name]  Attending Physician: Terence Lux, MD    Assessment:   AMS/??memory loss  UTI  ?Right breast abscess with drainage tube  HTN  Hypothyroidism  HLD  GERD  Chronic anemia    Plan:   Admitted to imcu  CT of head not significant  Neurology consult  Continue iv Levaquin and vanco  Follow blood and urine culture  Follow body fluid culture from rt breast  ID consult if needed  May refer to surgery Dr Chelsea Aus at [facility] as out pt  Monitor BP  Continue home medications      History of Present Illness:   [name] is a 68 y.o. female who presents to the hospital with AMS and ? memory loss.Pt was  brought here by daughter and son because of memory loss since surgery to remove right breast expanders at [facility] 4 days ago with drainage tube. No fever. Patient has not been taking any medications except for asa. Patient could not remember why she had the surgery, but no other focal deficits. Pt does not recall when and why she had breast surgery at [facility].Denied chill. No sob, no cp, no weakness and numbness in all extremities at that time. From ed pt was admitted to imcu for further management. Neurology consult requested.    Past Medical History:     Past Medical History   Diagnosis Date   . Hypertension    . Breast cancer in female, unspecified laterality        Past Surgical History:   History reviewed. No pertinent past surgical history.    Family History:     Family History   Problem Relation Age of Onset   . Breast cancer Neg Hx        Social History:     History     Social History   . Marital Status: Widowed     Spouse Name: N/A     Number of Children: N/A   . Years of Education: N/A     Social History Main Topics   . Smoking status: Never Smoker    . Smokeless tobacco: Not on file   . Alcohol Use: No   . Drug Use: Not on file   . Sexual Activity: Not on file     Other Topics Concern   . Not on  file     Social History Narrative       Allergies:     Allergies   Allergen Reactions   . Morphine        Medications:     Prescriptions prior to admission   Medication Sig   . lisinopril (PRINIVIL,ZESTRIL) 10 MG tablet Take 10 mg by mouth daily.   Marland Kitchen omeprazole (PRILOSEC) 10 MG capsule Take 20 mg by mouth daily.   Marland Kitchen aspirin 81 MG EC tablet Take 81 mg by mouth daily.   Marland Kitchen oxyCODONE (OXY-IR) 5 MG capsule Take 5 mg by mouth every 6 (six) hours as needed.       Review of Systems:   A comprehensive review of systems was: General ROS: negative for - chills  Respiratory ROS: negative for - hemoptysis  Cardiovascular ROS: negative for - chest pain  Gastrointestinal ROS: negative for - diarrhea  Musculoskeletal ROS: negative for - joint swelling  Neurological ROS: negative for - seizures    Physical Exam:  Filed Vitals:    04/18/14 0423   BP: 127/52   Pulse: 82   Temp: 98.1 F (36.7 C)   Resp:    SpO2: 94%       Intake and Output Summary (Last 24 hours) at Date Time    Intake/Output Summary (Last 24 hours) at 04/18/14 0903  Last data filed at 04/18/14 0530   Gross per 24 hour   Intake    100 ml   Output     30 ml   Net     70 ml       Eyes - pupils equal and reactive, extraocular eye movements intact  Ears - bilateral TM's and external ear canals normal  Nose - normal and patent, no erythema, discharge or polyps  Mouth - mucous membranes moist, pharynx normal without lesions  Neck - supple, no significant adenopathy  Lymphatics - no palpable lymphadenopathy  Chest - no tachypnea, retractions or cyanosis  Heart - S1 and S2 normal  Abdomen - no rebound tenderness noted  Extremities - no pedal edema noted    Labs:     Results    Procedure Component Value Units Date/Time    Comprehensive metabolic panel [[id number]]  (Abnormal) Collected:  04/18/14 0626    Specimen Information:  Blood Updated:  04/18/14 0738     Glucose 106 (H) mg/dL      BUN 9 mg/dL      Creatinine 0.7 mg/dL      Sodium 244 mEq/L      Potassium 4.6 mEq/L       Chloride 107 mEq/L      CO2 32 (H) mEq/L      CALCIUM 8.9 mg/dL      Protein, Total 5.8 (L) g/dL      Albumin 3.0 (L) g/dL      AST (SGOT) 30 U/L      ALT 23 U/L      Alkaline Phosphatase 75 U/L      Bilirubin, Total 0.2 mg/dL      Globulin 2.8 g/dL      Albumin/Globulin Ratio 1.1     GFR [[id number]] Collected:  04/18/14 0626     EGFR >60.0 Updated:  04/18/14 0738    CBC and differential [[id number]]  (Abnormal) Collected:  04/18/14 0626    Specimen Information:  Blood / Blood Updated:  04/18/14 0700     WBC 8.37 x10 3/uL      RBC 3.35 (L) x10 6/uL      Hgb 8.6 (L) g/dL      Hematocrit 59.5 (L) %      MCV 85.4 fL      MCH 25.7 (L) pg      MCHC 30.1 (L) g/dL      RDW 14 %      Platelets 450 (H) x10 3/uL      MPV 10.0 fL      Neutrophils 64 %      Lymphocytes Automated 27 %      Monocytes 9 %      Eosinophils Automated 0 %      Basophils Automated 0 %      Immature Granulocyte 0 %      Nucleated RBC 0 /100 WBC      Neutrophils Absolute 5.32 x10 3/uL      Abs Lymph Automated 2.23 x10 3/uL      Abs Mono Automated 0.78 x10 3/uL      Abs Eos Automated 0.02 x10  3/uL      Absolute Baso Automated 0.02 x10 3/uL      Absolute Immature Granulocyte 0.02 x10 3/uL     Urine culture [[id number]] Collected:  04/17/14 1945    Specimen Information:  Urine / Urine, Clean Catch Updated:  04/18/14 0208    CULTURE BLOOD AEROBIC AND ANAEROBIC [[id number]] Collected:  04/17/14 1903    Specimen Information:  Blood, Intravenous Line Updated:  04/18/14 0027    Narrative:      1 BLUE+1 PURPLE    CULTURE BLOOD AEROBIC AND ANAEROBIC [[id number]] Collected:  04/17/14 1903    Specimen Information:  Blood, Intravenous Line Updated:  04/18/14 0027    Narrative:      1 BLUE+1 PURPLE    UA, Reflex to Microscopic [[id number]]  (Abnormal) Collected:  04/17/14 1945    Specimen Information:  Urine Updated:  04/17/14 2010     Urine Type Clean Catch      Color, UA Straw      Clarity, UA Clear      Specific Gravity UA 1.004      Urine pH 7.0      Leukocyte  Esterase, UA Small (A)      Nitrite, UA Negative      Protein, UR Negative      Glucose, UA Negative      Ketones UA Negative      Urobilinogen, UA Negative mg/dL      Bilirubin, UA Negative      Blood, UA Negative      RBC, UA 0 - 5 /hpf      WBC, UA 6 - 10 (A) /hpf      Squamous Epithelial Cells, Urine 0 - 5 /hpf      Trans Epithel, UA 0 - 2 /hpf      Urine Bacteria Rare /hpf     Troponin I [[id number]] Collected:  04/17/14 1903    Specimen Information:  Blood Updated:  04/17/14 1940     Troponin I <0.01 ng/mL     Lipase [[id number]] Collected:  04/17/14 1903    Specimen Information:  Blood Updated:  04/17/14 1939     Lipase 24 U/L     Magnesium [[id number]] Collected:  04/17/14 1903    Specimen Information:  Blood Updated:  04/17/14 1939     Magnesium 2.2 mg/dL     GFR [[id number]] Collected:  04/17/14 1903     EGFR >60.0 Updated:  04/17/14 1939    Comprehensive metabolic panel [[id number]]  (Abnormal) Collected:  04/17/14 1903    Specimen Information:  Blood Updated:  04/17/14 1939     Glucose 104 (H) mg/dL      BUN 11 mg/dL      Creatinine 0.7 mg/dL      Sodium 062 mEq/L      Potassium 4.3 mEq/L      Chloride 101 mEq/L      CO2 33 (H) mEq/L      CALCIUM 9.3 mg/dL      Protein, Total 6.5 g/dL      Albumin 3.4 (L) g/dL      AST (SGOT) 36 (H) U/L      ALT 25 U/L      Alkaline Phosphatase 87 U/L      Bilirubin, Total 0.2 mg/dL      Globulin 3.1 g/dL      Albumin/Globulin Ratio 1.1     APTT [[id number]] Collected:  04/17/14 1903     PTT 34 sec Updated:  04/17/14 1926    Prothrombin time/INR [[id number]] Collected:  04/17/14 1903    Specimen Information:  Blood Updated:  04/17/14 1926     PT 12.9 sec      PT INR 1.0      PT Anticoag. Given Within 48 hrs. None     CBC and differential [[id number]]  (Abnormal) Collected:  04/17/14 1903    Specimen Information:  Blood / Blood Updated:  04/17/14 1918     WBC 9.04 x10 3/uL      RBC 3.52 (L) x10 6/uL      Hgb 9.2 (L) g/dL      Hematocrit 89.3 (L) %      MCV 85.5 fL      MCH 26.1  (L) pg      MCHC 30.6 (L) g/dL      RDW 14 %      Platelets 496 (H) x10 3/uL      MPV 10.0 fL      Neutrophils 68 %      Lymphocytes Automated 24 %      Monocytes 8 %      Eosinophils Automated 0 %      Basophils Automated 0 %      Immature Granulocyte 0 %      Nucleated RBC 0 /100 WBC      Neutrophils Absolute 6.12 x10 3/uL      Abs Lymph Automated 2.15 x10 3/uL      Abs Mono Automated 0.75 x10 3/uL      Abs Eos Automated 0.00 x10 3/uL      Absolute Baso Automated 0.02 x10 3/uL      Absolute Immature Granulocyte 0.04 x10 3/uL     Lactic acid, plasma [[id number]] Collected:  04/17/14 1903    Specimen Information:  Blood Updated:  04/17/14 1918     Lactic acid 1.2 mmol/L           Recent CBC   Recent Labs      04/18/14   0626   RBC  3.35*   HEMOGLOBIN  8.6*   HEMATOCRIT  28.6*   MCV  85.4   MCH, POC  25.7*   MCHC  30.1*   RDW  14   MPV  10.0     Recent BMP   Recent Labs      04/18/14   0626   GLUCOSE  106*   BUN  9   CREATININE  0.7   CALCIUM  8.9   SODIUM  145   POTASSIUM  4.6   CHLORIDE  107   CO2  32*       Rads:   Radiological Procedure reviewed.    Signed by: Marcelino Freestone

## 2014-04-18 NOTE — Progress Notes (Signed)
Case Management Initial Discharge Planning Assessment     Psychosocial/Demographic Information   Name of interviewee/s:  Emily [name]   Orientation and decision making abilities of patient (ie a&ox3 able to make decisions, demented patient, patient on vent, etc)    A&OX3   Does the patient have an Advance Directive? Location? (home/on chart, if home-advised to bring in copy?) <no information>  Advance Directive: Patient has advance directive, copy not in chart]   Healthcare Decision Maker (HDM) (if other than the patient) Include relationship and contact information.      Any additional emergency contacts? Extended Emergency Contact Information  Primary Emergency Contact: Amoah,Edwin   [place]  Home Phone: [phone]  Mobile Phone: [phone]  Relation: Son   Pt lives with:  Living Arrangements: Alone, Spouse/significant other]   Type of residence where patient lives:    ]APT   ]   Prior level of functioning (ambulation & ADL's)   ]INDEPENDENT   Support system-list  (i.e.church, friends, extended family, friends?)  FAMILY & FRIENDS   Do you want to designate an individual who will care for or assist you upon discharge? Y   If yes: Please list the name, relationship, phone number, and address of the designated individual. Name:CATELLA  Relationship:DAUGHTER IN LAW  Phone Number:  Address:       Correct Insurance listed on face sheet - verified with the patient/HDM  Y, ALSO HAS MEDICARE AS SECONDARY- NOTIFIED REGISTRATION      Discharge Planning Services in Place  Name of Primary Care Physician verified in patient banner (update in patient banner if not listed) Ramon Dredge, MD (General)  [phone]   What DME does the patient currently own? (rolling walker, hospital bed, home O2, BiPAP/CPAP, bedside commode, cane, hoyer lift)  ]   ]NONE   ]   Are PT/OT services indicated? If so, has it been ordered?   N   Has the patient been to an Acute Rehab or SNF in the past?  If so, where?    N   Does  the patient currently have home health or hospice/palliative services in place?  If so, list agency name.    N   Does the patient already have community dialysis set up?  If so, where? NA      Readmission Assessment  What is the current LACE score?  4   Is this patient an inpatient to inpatient 30 day readmission?  N   Previous admission discharge diagnosis     If readmission, what was the previous D/C plan?  Contributing factors to readmission (i.e., no follow up appt on previous d/c, unable to get meds, no insurance, no social support, etc.)     Did patient/family understand what medication was for and how to administer, symptoms to indicate worsening condition, activity and diet restrictions at time of previous d/c?        Anticipated Discharge Plan  Anticipated Disposition: Option A  HOME   Anticipated Disposition: Option B     Who will transport the patient upon discharge?  [facility]   If applicable, were SNF or Hospice choices provided?  N   Palliative Care Consult needed? (if yes, contact attending MD)  N      Inpatient Medicare/Medicare HMO Patients Only  Was an initial IMM signed within 24 hours of admission?  (Look in Media Tab, Documents Table or Shadow Chart)  N

## 2014-04-18 NOTE — Plan of Care (Signed)
Problem: Pain  Goal: Patient's pain/discomfort is manageable  Outcome: Progressing  Patient has pain on the R chest caused by the mastectomy. She has been treated with Tramadol as needed.     Comments:   Patient is alert and oriented X 4, confused at times. Pupils are equal and responsive. Neuro checks done every 4 hrs. Patient has been admitted for altered mental status. Dr Francesco Sor states that most likely this is no sn stroke.   Grib strength is equal on the upper extremities. Patient is in good spirits today, cooperates with daily ADLs, and ambulates with one person assist. Her appetite is well and had meals at proper times.   Education about MRI exam and Thiamine treatment explained to patient and caregivers, who understood and all their questions were answered.

## 2014-04-18 NOTE — Consults (Signed)
NEUROLOGY CONSULATION    Date Time: 04/18/2014 12:22 PM  Patient Name: Emily [name]  Attending Physician: Terence Lux, MD      Assessment & Plan:   Acute onset of altered mental status, encephalopathy in the setting of systemic infection likely secondary to toxic metabolic encephalopathy.  Right breast abscess, status post surgical removal of expander.  History of hypertension  History of breast cancer, details not available.  The right sided.     Send out nutritional markers.   Get MRI of the brain to rule out sequelae, from systemic infection.   Checks, ESR, CRP.   I do not believe the patient has had a stroke.   Will follow along and make recommendations accordingly.   Antibiotics per primary care physician may need ID evaluation.    History of Present Illness:   The patient is a 68 year old lady, comes in to the hospital, because of altered mental status and confusion.  Patient does herself is unable to provide any meaningful history.  About why she is here what exactly happened.    I spoke to the patient's daughter who says that she spoke to her yesterday and the patient seemed quite confused and disoriented.  She was totally lost according to her's.  She says that she did not remember that she had been to the hospital recently with surgery and a wound VAC placed, for the infection in her Rt breast  expander that was placed in earlier.    There is no report of any seizure-like activity, any slurred speech or any one-sided weakness per se.  She has not been placed on any new narcotic medications.  Of note, she was supposed to be on antibiotics, but she had not been taking the antibiotics as prescribed.    Past Medical History:     Past Medical History   Diagnosis Date   . Hypertension    . Breast cancer in female, unspecified laterality        Meds:      Scheduled Meds: PRN Meds:      aspirin 81 mg Oral Daily   enoxaparin 40 mg Subcutaneous Daily   levofloxacin 500 mg Intravenous Q24H   lisinopril 10 mg  Oral Daily   pantoprazole 40 mg Oral QAM AC   thiamine 100 mg Intravenous Daily   vancomycin 1,000 mg Intravenous Q12H SCH       Continuous Infusions:  . 0.9 % NaCl with KCl 20 mEq 75 mL/hr (04/18/14 0004)      traMADol 50 mg Q6H PRN         I personally reviewed all of the medications    Allergies   Allergen Reactions   . Morphine        Social & Family History:     History     Social History   . Marital Status: Widowed     Spouse Name: N/A     Number of Children: N/A   . Years of Education: N/A     Occupational History   . Not on file.     Social History Main Topics   . Smoking status: Never Smoker    . Smokeless tobacco: Not on file   . Alcohol Use: No   . Drug Use: Not on file   . Sexual Activity: Not on file     Other Topics Concern   . Not on file     Social History Narrative     Family History  Problem Relation Age of Onset   . Breast cancer Neg Hx        Review of Systems:    Pt too cognitively or communication impaired to participate in ROS.    Physical Exam:   Blood pressure 144/63, pulse 59, temperature 98.8 F (37.1 C), temperature source Oral, resp. rate 20, height 1.6 m (5\' 3" ), weight 77.111 kg (170 lb), SpO2 98 %.    HEENT: Normocephalic. Non-icter, no congestion, no carotid bruits  Optho: unable to visualize  Lungs:  CTA bil  Abdomen soft, nontender.  Cardiac:  S1,S2, normal rate and rhythm  Neck: supple, no lymphadenopathy, no thyromegaly, no JVD, no cartoid bruits  Extremities: No scar noted in the right breast, along with a wound VAC tubing placed over there.    Skin: peau de orange   Neuro:  Level of consciousness:  Alert and awake, oriented to [facility] July 2014, did not know the exact date, recall was 0 out of 3 at 5 minutes.  She is able to spell world forwards unable to spell it backwards.  She had very little details about how she got to the hospital, why or does not even remember the details about her surgery.    Cognition:  Intact naming, recognition, concentration and  following complex commands  Cranial Nerves:  II-XII intact  Strength:  No upper extremity drift, 5/5 strength x 4 extremities.  No tremor.  No asterixis noted on exam.  Coordination:  Intact FTN testing  Reflexes:  +2 throughout, down going toes bil  Sensation: Intact x 4 extremities to LT, temp, vibration  Gait:  Deferred     Labs:     Recent Labs  Lab 04/18/14  0626 04/17/14  1903   GLUCOSE 106* 104*   BUN 9 11   CREATININE 0.7 0.7   CALCIUM 8.9 9.3   SODIUM 145 143   POTASSIUM 4.6 4.3   CHLORIDE 107 101   CO2 32* 33*   ALBUMIN 3.0* 3.4*   MAGNESIUM  --  2.2   AST (SGOT) 30 36*   ALT 23 25   BILIRUBIN, TOTAL 0.2 0.2   ALKALINE PHOSPHATASE 75 87       Recent Labs  Lab 04/18/14  0626 04/17/14  1903   WBC 8.37 9.04   HEMOGLOBIN 8.6* 9.2*   HEMATOCRIT 28.6* 30.1*   MCV 85.4 85.5   MCH, POC 25.7* 26.1*   MCHC 30.1* 30.6*   PLATELETS 450* 496*         Recent Labs      04/17/14   1903   PTT  34   PT  12.9   PT INR  1.0          Radiology Results (24 Hour)    Procedure Component Value Units Date/Time    CT Head without Contrast [[id number]] Collected:  04/17/14 1959    Order Status:  Completed Updated:  04/17/14 2003    Narrative:      HISTORY: Mental status change    EXAMINATION AND TECHNIQUE: Unenhanced CT examination of brain performed.      COMPARISON: None available    FINDINGS: No mass-effect or midline shift on these unenhanced images.  Ventricular size within normal limits. No CT evidence of acute  intra-axial fluid collections to suggest acute hemorrhage. Osseous  structures are intact. The visualized paranasal sinuses are clear.       Impression:       No CT evidence of acute  process.    Ivin Poot, MD   04/17/2014 7:59 PM      XR Chest AP Portable [[id number]] Collected:  04/17/14 1806    Order Status:  Completed Updated:  04/17/14 1812    Narrative:      HISTORY: Mental status change with clinical concern for pneumonia    EXAMINATION: Portable frontal view of chest at 1757    COMPARISON: 10/22/2011      FINDINGS: Patient partially rotated to the right. Surgical clips are  present overlying the right mid to lower hemithorax. Thin radiopaque  tubular device overlies this region, likely external to the patient. No  focal consolidation or effusions. No acute edema. Cardiac silhouette  enlarged but stable. No other interval change.      Impression:         1. Postsurgical changes of right chest as discussed above.  2. No radiographic evidence of pneumonia.    Ivin Poot, MD   04/17/2014 6:08 PM             All recent brain and spine imaging (MRI, CT) personally reviewed.    Case discussed with: daughter and patient       Signed by: Cathe Mons, MD  Spectralink: [phone]     Answering Service: [phone]

## 2014-04-18 NOTE — Progress Notes (Signed)
Severe Sepsis Screen    Date: 04/18/2014 Time: 3:33 PM  Nurse Signature: Georgia Lopes    Exclusions:      Patients meeting the following criteria are excluded from screening:     []  Suspicion or diagnosis of sepsis is documented and until 72 hours after antibiotics started or last regimen change:   - If Yes, Date of Documented Sepsis:                                          - If Yes, Date of last change in antibiotics:                                           []  Surgery - No screening for 24 hours after surgery   - If Yes, Date of Surgery:                                           []  Arctic Sun hypothermia protocol- Resume screening when arctic sun complete   []  Comfort Care Orders- Do not resume screening    Did you check any of the boxes above?     [x]  No, Continue to section A   []  Yes, Stop Here, Patient Excluded from Sepsis Screening. If screening should resume in the future, place "sticky note to treatment team" with date/time of when screening should resume. Communicate patient excluded from screening due to Comfort Care Orders using "sticky note to treatment team."    A. Infection:      Does your patient have ONE or more of the following infection criteria?     []  Documented Infection - Does the patient have positive culture results (from blood, sputum, urine, etc)?   []  Anti-Infective Therapy - Is the patient receiving antibiotic, antifungal, or other anti-infective therapy?   []  Pneumonia - Is there documentation of pneumonia (X Ray, etc)?   []  WBC's - Have WBC's been found in normally sterile fluid (urine, CSF, etc.)?   []  Perforated Viscus - Does the patient have a perforated hollow organ (bowel)?    A.  Did you check any of the boxes above?     [x]  No, Stop Here and Sepsis Screen Negative   []  Yes, continue to section B    B. SIRS:      Does your patient have TWO or more of the following SIRS criteria?     []  Temperature - Is the patient's temperature: Temp: 98.8 F (37.1 C) (04/18/14  1148)   - Greater than or equal to 38.3 degrees C (greater than 100.9 degrees F)?   - Less than or equal to 36 degrees C (less than or equal to 96.8 degrees F)?     []  Heart Rate: Heart Rate: 59 (04/18/14 1148)   - Is the patient's heart rate greater than or equal to 90 bpm?     []  Respiratory: Resp Rate: 20 (04/18/14 1148)   - Is the patient's respiratory rate greater than 20?     []  WBC Count - Is the patient's WBC count:   Recent Labs  Lab 04/18/14  0626   WBC 8.37       -  Greater than or equal to 12,000/mm3 OR   - Less than or equal to 4,000/mm3 OR    - Are there greater than 10% immature neutrophils (bands)?     []  Glucose >140 without diabetes or on steroids?   Recent Labs  Lab 04/18/14  0626   GLUCOSE 106*         []  Significant edema is present?    B.  Did you check two or more of the boxes above?     []  No, Stop Here and Sepsis Screen Negative   []  Yes, contact Charge Nurse, continue to section C    C. ACUTE Organ Dysfunction:      Does your patient have ONE or more of the following organ dysfunction? (May need to wait for lab results for assessment - see below) Organ dysfunction must be a result of the sepsis NOT CHRONIC conditions.     []  Cardiovascular - Does the patient have a: BP: 144/63 mmHg (04/18/14 1148)   - Systolic Blood Pressure less than or equal to 90 mmHg OR   - Systolic Blood Pressure has dropped 40 mmHg or more from baseline OR   - Mean Arterial Pressure less than or equal to 70 mmHg (for at least one hour despite fluid resuscitation OR   - require vasopressor support?     []  Respiratory - Does the patient have new hypoxia defined by any of the following?   - A sustained increase in oxygen requirements by at least 2L/min on NC or 28% FiO2 within the last 24 hrs OR   - A persistent decrease in oxygen saturation of greater than or equal to 5% lasting at least four or more hours and occurring within the last 24 hours     []  Renal - Does the patient have:   - low urine output (e.g.  Less than 0.5 mL/kg/HR for one hour despite adequate fluid resuscitation OR   - Increased creatinine (greater than 50% increase from baseline) OR   - require acute dialysis?     []  Hematologic - Does the patient have:   - Low platelet count (less than 100,000 mm3)   Recent Labs  Lab 04/18/14  0626   PLATELETS 450*    OR   - INR/aPTT greater than upper limit of normal?   Recent Labs  Lab 04/17/14  1903   PT INR 1.0    OR No results for input(s): APTT in the last 168 hours.     []  Metabolic - Does the patient have a high lactate (plasma lactate greater than or equal to 2.4 mMol/L?   Recent Labs  Lab 04/17/14  1903   LACTIC ACID 1.2         []  Hepatic - Are the patient's liver enzymes elevated (ALT greater than 72 IU/L or Total Bilirubin greater than 2 MG/dL)?   Recent Labs  Lab 04/18/14  0626   BILIRUBIN, TOTAL 0.2   ALT 23         []  CNS - Does the patient have altered consciousness or reduced Glasgow Coma Scale?     Other Active Diagnoses that may be contributing to signs of end organ dysfunction (Ex. Chronic kidney disease, cirrhosis):   - ________________________________________________________________      C.  Did you check any of the boxes above?     []  No, Sepsis Screen Negative   []  YES:  A) Infection + B) SIRS + C) Acute Organ Dysfunction = Positive Screen for  Severe Sepsis. Notify attending (house officer during off-hours).     Notify Attending/House Garment/textile technologist and document in Complex Assessment under provider notification   - Name of physician notified:                                           - Date/Time Notifiied:                                             Document actions:   _0  Lactate drawn   _1  Blood Cultures obtained   _2  Antibiotics initiated or modified   _3   IV Fluid administered 0.9% NS __________ mLs given   Nursing Comments/Narrative:    - ______________________________________________________________     The Surviving Sepsis Guidelines recommend the following interventions to be  completed within one hour of a positive sepsis screen.   Obtain new blood cultures prior to antibiotic administration if not done within the last 24 hours.   Obtain lactate level, if initial lactate > 60mol, repeat lactate in 2 hours for goal decrease 10-20%; If there is not a decrease call [facility], hospital  If SBP < 90 or MAP < 65 or lactate greater than 4 mmol/dl; Start 0.9% NS IV Fluid Bolus of 30 mL/Kg (minimum) to maintain MAP > 65    Initiate vasopressors for hypotension not responding to fluid resuscitation (1st line Norepinephrine 1 - 300 mcg/min IV) - Patient must be in CCU if requires vasopressor   Initiate or escalate antibiotic therapy   Goal urine output greater than/equal to 0.5 mL/kg/hr

## 2014-04-19 LAB — CBC AND DIFFERENTIAL
Basophils Absolute Automated: 0.06 10*3/uL (ref 0.00–0.20)
Basophils Automated: 1 %
Eosinophils Absolute Automated: 0.11 10*3/uL (ref 0.00–0.70)
Eosinophils Automated: 1 %
Hematocrit: 29.1 % — ABNORMAL LOW (ref 37.0–47.0)
Hgb: 8.8 g/dL — ABNORMAL LOW (ref 12.0–16.0)
Immature Granulocytes Absolute: 0.02 10*3/uL
Immature Granulocytes: 0 %
Lymphocytes Absolute Automated: 3.13 10*3/uL (ref 0.50–4.40)
Lymphocytes Automated: 34 %
MCH: 26 pg — ABNORMAL LOW (ref 28.0–32.0)
MCHC: 30.2 g/dL — ABNORMAL LOW (ref 32.0–36.0)
MCV: 86.1 fL (ref 80.0–100.0)
MPV: 10.7 fL (ref 9.4–12.3)
Monocytes Absolute Automated: 0.86 10*3/uL (ref 0.00–1.20)
Monocytes: 9 %
Neutrophils Absolute: 5.05 10*3/uL (ref 1.80–8.10)
Neutrophils: 55 %
Nucleated RBC: 0 /100 WBC (ref 0–1)
Platelets: 433 10*3/uL — ABNORMAL HIGH (ref 140–400)
RBC: 3.38 10*6/uL — ABNORMAL LOW (ref 4.20–5.40)
RDW: 14 % (ref 12–15)
WBC: 9.21 10*3/uL (ref 3.50–10.80)

## 2014-04-19 LAB — COMPREHENSIVE METABOLIC PANEL
ALT: 23 U/L (ref 0–55)
AST (SGOT): 25 U/L (ref 5–34)
Albumin/Globulin Ratio: 1.1 (ref 0.9–2.2)
Albumin: 3 g/dL — ABNORMAL LOW (ref 3.5–5.0)
Alkaline Phosphatase: 77 U/L (ref 37–106)
BUN: 6 mg/dL — ABNORMAL LOW (ref 7–19)
Bilirubin, Total: 0.2 mg/dL (ref 0.2–1.2)
CO2: 28 mEq/L (ref 22–29)
Calcium: 8.7 mg/dL (ref 8.5–10.5)
Chloride: 107 mEq/L (ref 100–111)
Creatinine: 0.7 mg/dL (ref 0.6–1.0)
Globulin: 2.8 g/dL (ref 2.0–3.6)
Glucose: 99 mg/dL (ref 70–100)
Potassium: 4.1 mEq/L (ref 3.5–5.1)
Protein, Total: 5.8 g/dL — ABNORMAL LOW (ref 6.0–8.3)
Sodium: 144 mEq/L (ref 136–145)

## 2014-04-19 LAB — TSH: TSH: 2.53 u[IU]/mL (ref 0.35–4.94)

## 2014-04-19 LAB — FOLATE: Folate: 16.2 ng/mL

## 2014-04-19 LAB — GFR: EGFR: >60

## 2014-04-19 LAB — HEMOLYSIS INDEX: Hemolysis Index: 0 (ref 0–18)

## 2014-04-19 MED ORDER — LORAZEPAM 2 MG/ML IJ SOLN
1.0000 mg | Freq: Once | INTRAMUSCULAR | Status: AC
Start: 2014-04-19 — End: 2014-04-19
  Administered 2014-04-19: 1 mg via INTRAVENOUS
  Filled 2014-04-19: qty 1

## 2014-04-19 MED ORDER — SODIUM CHLORIDE 0.9 % IV MBP
500.0000 mg | Freq: Four times a day (QID) | INTRAVENOUS | Status: DC
Start: 2014-04-19 — End: 2014-04-22
  Administered 2014-04-19 – 2014-04-22 (×11): 500 mg via INTRAVENOUS
  Filled 2014-04-19 (×16): qty 0.5

## 2014-04-19 NOTE — Progress Notes (Signed)
Patient is more confused today, aggressive and combative (she understands she is in the hospital, but states she doesn't know why and wants to leave). Phone call from Dr Alvia [facility] to report the situation. He ordered to give 1 mg of ativan IV ASAP and he will be here to assess.  All done as ordered.

## 2014-04-19 NOTE — Progress Notes (Signed)
PROGRESS NOTE    Date Time: 04/19/2014 10:32 AM  Patient Name: Emily [name], Emily [name]      Assessment:   Acute onset of altered mental status, encephalopathy  likely secondary to toxic metabolic encephalopathy.  Right breast abscess, status post surgical removal of expander.s/p drain , working well, wound care consulted , abx   History of breast cancer, right sided.s/p prior mastectomy   Q UTI , cx neg   HTN  Hypothyroidism  HLD  GERD  Chronic anemia  Plan:    Admitted to imcu  CT of head not significant, MRI pending result   Neurology consult  D/c  Levaquin with ams and continue vanco, add mero ,Follow body fluid culture from rt breast  Follow blood and urine culture  May refer to surgery Dr Chelsea Aus at [facility] as out pt on Monday , will try to call [facility]   Monitor BP  Continue home medications      Patient Active Problem List   Diagnosis   . Altered mental status           Subjective:   Feels better , less confused   Per daughter was not on abx at home 4 days , carrying cipro info from d/c     Medications:     Current Facility-Administered Medications   Medication Dose Route Frequency   . aspirin  81 mg Oral Daily   . enoxaparin  40 mg Subcutaneous Daily   . levofloxacin  500 mg Intravenous Q24H   . lisinopril  10 mg Oral Daily   . pantoprazole  40 mg Oral QAM AC   . thiamine (VITAMIN B1) IVPB  100 mg Intravenous Q24H   . vancomycin  1,000 mg Intravenous Q12H [facility]               Physical Exam:     Filed Vitals:    04/19/14 1016   BP: 160/61   Pulse:    Temp: 99 F (37.2 C)   Resp: 20   SpO2:          Intake and Output Summary (Last 24 hours) at Date Time    Intake/Output Summary (Last 24 hours) at 04/19/14 1032  Last data filed at 04/18/14 1900   Gross per 24 hour   Intake      0 ml   Output      5 ml   Net     -5 ml         Physical Exam   Constitutional: Patient is awake confused , . Appears well-developed. No distress.   Head: Normocephalic and atraumatic.   Mouth/Throat: Oropharynx is clear and moist.    Eyes: EOM are normal. Pupils are equal, round, and reactive to light.   Neck: Neck supple.   Cardiovascular: Normal rate and regular rhythm.   Pulmonary/Chest: Effort normal and breath sounds normal. No respiratory distress. Rt breast s/p mastectomy , mild erythema noted with drain in place,   Abdominal: Soft. Bowel sounds are normal.   Musculoskeletal: Normal range of motion.   Neurological: Alert and oriented to person, place,       Labs:     Results    Procedure Component Value Units Date/Time    Folate [[id number]] Collected:  04/19/14 0445    Specimen Information:  Blood Updated:  04/19/14 1029     Folate 16.2 ng/mL     Hemolysis index [[id number]] Collected:  04/19/14 0445     Hemolysis Index 0  Updated:  04/19/14 0718    TSH [[id number]] Collected:  04/19/14 0445    Specimen Information:  Blood Updated:  04/19/14 0702     Thyroid Stimulating Hormone 2.53 uIU/mL     Wound Culture & Gram Stain [[id number]] Collected:  04/18/14 1906    Specimen Information:  Wound / Drainage Updated:  04/19/14 0639    Narrative:      ORDER#: [id number]                                    ORDERED BY: Neta Mends  SOURCE: Drainage rt breaast                          COLLECTED:  04/18/14 19:06  ANTIBIOTICS AT COLL.:                                RECEIVED :  04/19/14 01:52  Stain, Gram                                FINAL       04/19/14 06:39  04/19/14   No WBCs or organisms seen             No Squamous epithelial cells seen  Culture Wound                              PENDING      GFR [[id number]] Collected:  04/19/14 0445     EGFR >60.0 Updated:  04/19/14 0639    Comprehensive metabolic panel [[id number]]  (Abnormal) Collected:  04/19/14 0445    Specimen Information:  Blood Updated:  04/19/14 0639     Glucose 99 mg/dL      BUN 6 (L) mg/dL      Creatinine 0.7 mg/dL      Sodium 027 mEq/L      Potassium 4.1 mEq/L      Chloride 107 mEq/L      CO2 28 mEq/L      CALCIUM 8.7 mg/dL      Protein, Total 5.8 (L) g/dL      Albumin 3.0 (L) g/dL       AST (SGOT) 25 U/L      ALT 23 U/L      Alkaline Phosphatase 77 U/L      Bilirubin, Total 0.2 mg/dL      Globulin 2.8 g/dL      Albumin/Globulin Ratio 1.1     CBC and differential [[id number]]  (Abnormal) Collected:  04/19/14 0445    Specimen Information:  Blood / Blood Updated:  04/19/14 0555     WBC 9.21 x10 3/uL      RBC 3.38 (L) x10 6/uL      Hgb 8.8 (L) g/dL      Hematocrit 47.4 (L) %      MCV 86.1 fL      MCH 26.0 (L) pg      MCHC 30.2 (L) g/dL      RDW 14 %      Platelets 433 (H) x10 3/uL      MPV 10.7 fL      Neutrophils 55 %      Lymphocytes Automated 34 %  Monocytes 9 %      Eosinophils Automated 1 %      Basophils Automated 1 %      Immature Granulocyte 0 %      Nucleated RBC 0 /100 WBC      Neutrophils Absolute 5.05 x10 3/uL      Abs Lymph Automated 3.13 x10 3/uL      Abs Mono Automated 0.86 x10 3/uL      Abs Eos Automated 0.11 x10 3/uL      Absolute Baso Automated 0.06 x10 3/uL      Absolute Immature Granulocyte 0.02 x10 3/uL     CULTURE BLOOD AEROBIC AND ANAEROBIC [[id number]] Collected:  04/17/14 1903    Specimen Information:  Blood, Intravenous Line Updated:  04/19/14 0121    Narrative:      ORDER#: [id number]                                    ORDERED BY: HE, ALBERT  SOURCE: Blood, Intravenous Line peripheral           COLLECTED:  04/17/14 19:03  ANTIBIOTICS AT COLL.:                                RECEIVED :  04/18/14 00:25  Culture Blood Aerobic and Anaerobic        PRELIM      04/19/14 01:21  04/19/14   No Growth after 1 day/s of incubation.      CULTURE BLOOD AEROBIC AND ANAEROBIC [[id number]] Collected:  04/17/14 1903    Specimen Information:  Blood, Intravenous Line Updated:  04/19/14 0121    Narrative:      ORDER#: [id number]                                    ORDERED BY: HE, ALBERT  SOURCE: Blood, Intravenous Line peripheral           COLLECTED:  04/17/14 19:03  ANTIBIOTICS AT COLL.:                                RECEIVED :  04/18/14 00:25  Culture Blood Aerobic and Anaerobic        PRELIM       04/19/14 01:21  04/19/14   No Growth after 1 day/s of incubation.      Urine culture [[id number]] Collected:  04/17/14 1945    Specimen Information:  Urine / Urine, Clean Catch Updated:  04/18/14 2327    Narrative:      ORDER#: [id number]                                    ORDERED BY: HE, ALBERT  SOURCE: Urine, Clean Catch                           COLLECTED:  04/17/14 19:45  ANTIBIOTICS AT COLL.:                                RECEIVED :  04/18/14 02:08  Culture Urine  FINAL       04/18/14 23:27  04/18/14   1,000 - 9,000 CFU/ML of normal urogenital or skin microbiota, no further             work      Vitamin B12 [[id number]]  (Abnormal) Collected:  04/18/14 1310    Specimen Information:  Blood Updated:  04/18/14 1645     Vitamin [name]-12 1296 (H) pg/mL     Hemolysis index [[id number]] Collected:  04/18/14 1310     Hemolysis Index 1 Updated:  04/18/14 1609    C Reactive Protein [[id number]]  (Abnormal) Collected:  04/18/14 1310    Specimen Information:  Blood Updated:  04/18/14 1348     C-Reactive Protein 0.7 (H) mg/dL     Sedimentation rate (ESR) [[id number]]  (Abnormal) Collected:  04/18/14 1310    Specimen Information:  Blood Updated:  04/18/14 1336     Sed Rate 54 (H) mm/Hr     Vitamin B1 [[id number]] Collected:  04/18/14 1310    Specimen Information:  Blood Updated:  04/18/14 1329    Narrative:      Protect from light.            Recent Labs  Lab 04/19/14  0445 04/18/14  0626 04/17/14  1903   WBC 9.21 8.37 9.04   HEMOGLOBIN 8.8* 8.6* 9.2*   HEMATOCRIT 29.1* 28.6* 30.1*   PLATELETS 433* 450* 496*       Recent Labs  Lab 04/19/14  0445 04/18/14  0626 04/17/14  1903   SODIUM 144 145 143   POTASSIUM 4.1 4.6 4.3   CHLORIDE 107 107 101   CO2 28 32* 33*   BUN 6* 9 11   CREATININE 0.7 0.7 0.7   EGFR >60.0 >60.0 >60.0   GLUCOSE 99 106* 104*   CALCIUM 8.7 8.9 9.3           Rads:   Radiological Procedure reviewed.  Radiology Results (24 Hour)    Procedure Component Value Units Date/Time    MRI Brain W WO  Contrast [[id number]] Resulted:  04/18/14 2117    Order Status:  Sent Updated:  04/18/14 2210            Signed by: Dorathy Daft

## 2014-04-19 NOTE — Plan of Care (Signed)
Problem: Safety  Goal: Patient will be free from injury during hospitalization  Outcome: Progressing  Patient is alert and oriented x 3. SR on the monitor.    Problem: Pain  Goal: Patient's pain/discomfort is manageable  Outcome: Progressing  Dennis any pain.

## 2014-04-19 NOTE — Plan of Care (Signed)
Problem: Safety  Goal: Patient will be free from injury during hospitalization  Outcome: Progressing  Patient has been very aggressive and uncooperative today. Reoriented and treatment explained at all times.     Comments:   Patient is alert and oriented X 2, confused at times, patient has been uncooperative and aggressive today, patient reoriented and assisted with the family's assistance. Pupils are equal and responsive. Neuro checks done every 4 hrs. Grib strength is equal on the upper extremities. Patient ambulates independently. Her appetite is well and had meals at proper times.   Education about MRI report and Thiamine treatment explained to patient and caregivers, who understood and all their questions were answered

## 2014-04-19 NOTE — Progress Notes (Signed)
Progress Note    Date Time: 04/19/2014 12:52 PM  Patient Name: Emily [name]  Attending Physician: Terence Lux, MD      Assessment & Plan:   Acute onset of altered mental status, encephalopathy in the setting of systemic infection likely secondary to toxic metabolic encephalopathy.  Possible contribution as well from use of narcotics. R/o breast-surgery-related infection.  MRI brain is normal.  Pt somewhat improved but got Ativan earlier this AM.    Right breast abscess, status post surgical removal of expander.    History of hypertension    History of breast cancer, details not available. The right sided.     Consider alternative to Levaquin as this can also cause poor mental status   Wound nurse should review incision site   Avoid Tramadol as this can cause hallucinations   Could use ibuprofen or Tylenol if pain control is needed    Subjective:   Patient Seen and Examined. The notes from the last 24 hours were reviewed.   Pt complaining that everyone is 'treating her like a child and she is not a child'    Review of Systems:    Pt too cognitively or communication impaired to participate in ROS.    Physical Exam:   Blood pressure 169/77, pulse 78, temperature 97.5 F (36.4 C), temperature source Oral, resp. rate 20, height 1.6 m (5\' 3" ), weight 77.111 kg (170 lb), SpO2 98 %.    Pt cooperative at this time (not earlier)  Complains that no one is listening to her, will not admit to any hallucinations or change of mental status  "I showed them my I.D., they should know who I am"  Able to state her location, year, month      Meds:      Scheduled Meds: PRN Meds:      aspirin 81 mg Oral Daily   enoxaparin 40 mg Subcutaneous Daily   levofloxacin 500 mg Intravenous Q24H   lisinopril 10 mg Oral Daily   pantoprazole 40 mg Oral QAM AC   thiamine (VITAMIN B1) IVPB 100 mg Intravenous Q24H   vancomycin 1,000 mg Intravenous Q12H [facility]       Continuous Infusions:  . 0.9 % NaCl with KCl 20 mEq 75 mL/hr (04/18/14 0004)       traMADol 50 mg Q6H PRN           I personally reviewed all of the medications    Labs:     Recent Labs  Lab 04/19/14  0445 04/18/14  0626 04/17/14  1903   GLUCOSE 99 106* 104*   BUN 6* 9 11   CREATININE 0.7 0.7 0.7   CALCIUM 8.7 8.9 9.3   SODIUM 144 145 143   POTASSIUM 4.1 4.6 4.3   CHLORIDE 107 107 101   CO2 28 32* 33*   ALBUMIN 3.0* 3.0* 3.4*   MAGNESIUM  --   --  2.2   AST (SGOT) 25 30 36*   ALT 23 23 25    BILIRUBIN, TOTAL 0.2 0.2 0.2   ALKALINE PHOSPHATASE 77 75 87       Recent Labs  Lab 04/19/14  0445 04/18/14  0626 04/17/14  1903   WBC 9.21 8.37 9.04   HEMOGLOBIN 8.8* 8.6* 9.2*   HEMATOCRIT 29.1* 28.6* 30.1*   MCV 86.1 85.4 85.5   MCH, POC 26.0* 25.7* 26.1*   MCHC 30.2* 30.1* 30.6*   PLATELETS 433* 450* 496*         Recent  Labs      04/17/14   1903   PTT  34   PT  12.9   PT INR  1.0          Radiology Results (24 Hour)    Procedure Component Value Units Date/Time    MRI Brain W WO Contrast [[id number]] Collected:  04/19/14 1113    Order Status:  Completed Updated:  04/19/14 1120    Narrative:      HISTORY:  Acute encephalopathy. Unremarkable CT.    TECHNIQUE: An MRI of the brain was performed utilizing the following  pulse sequences:  sagittal and axial T1-weighted , T2-weighted axial,  FLAIR axial, diffusion weighted axial images of the brain were obtained.   Following IV contrast administration, T1-weighted axial and T1-weighted  coronal images were obtained.    PRIORS: None.     FINDINGS: There is presence of mild volume loss with bilateral,  periventricular areas of white matter hyperintensity signal consistent  with small vessel ischemic changes. The ventricular system is normal in  size and contour for patient's age. There is no intracranial hemorrhage.  There is no herniation. There are no extraaxial fluid collections. The  diffusion sequences show no restriction to suggest an acute infarct.  After the uneventful intravenous administration of contrast was no  evidence of pathologic enhancement..  The  visualized portion of the paranasal sinuses are clear. The  cerebellar tonsils are normally positioned.      Impression:       Minimal small vessel disease. No acute vascular abnormality  or abnormal area of enhancement.    Virgel Paling, MD   04/19/2014 11:16 AM             All recent brain and spine imaging (MRI, CT) personally reviewed.    Case discussed with: Dr. Francesco Sor, nurse, attending    20 minutes; >50% time spent in counseling or coordination of care    Signed by: Ardelle Anton, MD       Answering Service: [phone]

## 2014-04-20 LAB — CBC AND DIFFERENTIAL
Basophils Absolute Automated: 0.06 10*3/uL (ref 0.00–0.20)
Basophils Automated: 1 %
Eosinophils Absolute Automated: 0.16 10*3/uL (ref 0.00–0.70)
Eosinophils Automated: 2 %
Hematocrit: 30.2 % — ABNORMAL LOW (ref 37.0–47.0)
Hgb: 9 g/dL — ABNORMAL LOW (ref 12.0–16.0)
Immature Granulocytes Absolute: 0.02 10*3/uL
Immature Granulocytes: 0 %
Lymphocytes Absolute Automated: 3.08 10*3/uL (ref 0.50–4.40)
Lymphocytes Automated: 32 %
MCH: 25.7 pg — ABNORMAL LOW (ref 28.0–32.0)
MCHC: 29.8 g/dL — ABNORMAL LOW (ref 32.0–36.0)
MCV: 86.3 fL (ref 80.0–100.0)
MPV: 10.7 fL (ref 9.4–12.3)
Monocytes Absolute Automated: 0.88 10*3/uL (ref 0.00–1.20)
Monocytes: 9 %
Neutrophils Absolute: 5.36 10*3/uL (ref 1.80–8.10)
Neutrophils: 56 %
Nucleated RBC: 0 /100 WBC (ref 0–1)
Platelets: 359 10*3/uL (ref 140–400)
RBC: 3.5 10*6/uL — ABNORMAL LOW (ref 4.20–5.40)
RDW: 14 % (ref 12–15)
WBC: 9.54 10*3/uL (ref 3.50–10.80)

## 2014-04-20 LAB — COMPREHENSIVE METABOLIC PANEL
ALT: 18 U/L (ref 0–55)
AST (SGOT): 18 U/L (ref 5–34)
Albumin/Globulin Ratio: 1.1 (ref 0.9–2.2)
Albumin: 3 g/dL — ABNORMAL LOW (ref 3.5–5.0)
Alkaline Phosphatase: 76 U/L (ref 37–106)
BUN: 6 mg/dL — ABNORMAL LOW (ref 7–19)
Bilirubin, Total: 0.2 mg/dL (ref 0.2–1.2)
CO2: 28 mEq/L (ref 22–29)
Calcium: 8.8 mg/dL (ref 8.5–10.5)
Chloride: 109 mEq/L (ref 100–111)
Creatinine: 0.7 mg/dL (ref 0.6–1.0)
Globulin: 2.7 g/dL (ref 2.0–3.6)
Glucose: 94 mg/dL (ref 70–100)
Potassium: 4.2 mEq/L (ref 3.5–5.1)
Protein, Total: 5.7 g/dL — ABNORMAL LOW (ref 6.0–8.3)
Sodium: 145 mEq/L (ref 136–145)

## 2014-04-20 LAB — GFR: EGFR: >60

## 2014-04-20 LAB — VANCOMYCIN, TROUGH: Vancomycin Trough: 16.4 ug/mL (ref 10.0–20.0)

## 2014-04-20 LAB — VITAMIN B1, PLASMA: Vitamin B1 (Thiamine): <7 nmol/L — ABNORMAL LOW (ref 8–30)

## 2014-04-20 MED ORDER — ACETAMINOPHEN 325 MG PO TABS
650.0000 mg | ORAL_TABLET | Freq: Four times a day (QID) | ORAL | Status: DC | PRN
Start: 2014-04-20 — End: 2014-04-23
  Administered 2014-04-20 – 2014-04-23 (×5): 650 mg via ORAL
  Filled 2014-04-20 (×5): qty 2

## 2014-04-20 MED ORDER — THIAMINE HCL 100 MG/ML IJ SOLN
500.0000 mg | Freq: Three times a day (TID) | INTRAVENOUS | Status: AC
Start: 2014-04-20 — End: 2014-04-22
  Administered 2014-04-20 – 2014-04-22 (×4): 500 mg via INTRAVENOUS
  Filled 2014-04-20 (×7): qty 5

## 2014-04-20 MED ORDER — THIAMINE HCL 100 MG/ML IJ SOLN
500.0000 mg | Freq: Three times a day (TID) | INTRAMUSCULAR | Status: DC
Start: 2014-04-20 — End: 2014-04-20

## 2014-04-20 MED ORDER — FLUCONAZOLE 100 MG PO TABS
100.0000 mg | ORAL_TABLET | ORAL | Status: DC
Start: 2014-04-20 — End: 2014-04-23
  Administered 2014-04-20 – 2014-04-23 (×4): 100 mg via ORAL
  Filled 2014-04-20 (×4): qty 1

## 2014-04-20 NOTE — Plan of Care (Signed)
Problem: Safety  Goal: Patient will be free from injury during hospitalization  Outcome: Progressing  Bed locked and in lowest position, pt able to make needs known, bed exit alarm on, call bell within reach    Problem: Pain  Goal: Patient's pain/discomfort is manageable  Outcome: Progressing  Denies pain, no acute distress noted.    Comments:   Family at bedside at change of shift, updated about plan of care.

## 2014-04-20 NOTE — Progress Notes (Signed)
Pt received from 6B, alert and oriented x 4, VSS. Pt oriented to room, call bell within reach of pt, bed in lowest position.

## 2014-04-20 NOTE — Progress Notes (Signed)
Date Time: 04/20/2014 3:40 PM  Patient Name: Emily [name]  Attending Physician: Terence Lux, MD  Patient Class: Inpatient  Hospital Day: 3            NEUROLOGY PROGRESS NOTE             Assessment/Plan   Acute onset of altered mental status, encephalopathy, likely toxic metabolic, from infection, and also from thiamine deficiency.  Possible Wernicke's encephalopathy given less than 7, thiamine level.  History of high blood pressure    Currently, he knew to replace thiamine, will change to 500 IV 3 times a day for 2 days and then 250 IV.  Will follow      Subjective:   Patient Seen and Examined.  Doing significantly better, now has significant recollection about what happened to her, family seems to believe that she is doing markedly better as well.    Review of Systems:   No headache, eye, ear nose, throat problems; no coughing or wheezing or shortness of breath, No chest pain or orthopnea, no abdominal pain, nausea or vomiting, No pain in the body or extremities, no psychiatric, neurological, endocrine, hematological or cardiac complaints except as noted above.        Physical Exam:   BP 150/64 mmHg  Pulse 57  Temp(Src) 98.2 F (36.8 C) (Oral)  Resp 20  Ht 1.6 m (5\' 3" )  Wt 77.111 kg (170 lb)  BMI 30.12 kg/m2  SpO2 100%    Neuro:  Level of consciousness:  Alert and appropriate  Oriented:  X 3  Facial Movements: symmetric  pupils are equal and reactive   Strength:  No upper extremity drift  Sensation to light touch: Intact bilaterally  HEENT: Normocephalic. No icter or congestion    Neck: supple, no lymphadenopathy, no thyromegaly, no JVD    CV: RRR, No murmers, rubs or gallops    Pulm: Clear to auscultation BIL, nonlabored     Abd: soft, NT/ND, normoactive BS    Extremities: Wound VAC, scar, right breast   Skin: no rashes or lesions noted    Meds:      Scheduled Meds: PRN Meds:      aspirin 81 mg Oral Daily   enoxaparin 40 mg Subcutaneous Daily   fluconazole 100 mg Oral Q24H SCH   lisinopril 10 mg Oral  Daily   meropenem 500 mg Intravenous 4 times per day   pantoprazole 40 mg Oral QAM AC   thiamine (VITAMIN B1) IVPB 100 mg Intravenous Q24H   thiamine (VITAMIN B1) IVPB 500 mg Intravenous TID   vancomycin 1,000 mg Intravenous Q12H SCH       Continuous Infusions:  . 0.9 % NaCl with KCl 20 mEq 75 mL/hr (04/19/14 2134)      acetaminophen 650 mg Q6H PRN               Labs:     Recent Labs  Lab 04/20/14  0503 04/19/14  0445 04/18/14  0626 04/17/14  1903   GLUCOSE 94 99 106* 104*   BUN 6* 6* 9 11   CREATININE 0.7 0.7 0.7 0.7   CALCIUM 8.8 8.7 8.9 9.3   SODIUM 145 144 145 143   POTASSIUM 4.2 4.1 4.6 4.3   CHLORIDE 109 107 107 101   CO2 28 28 32* 33*   ALBUMIN 3.0* 3.0* 3.0* 3.4*   MAGNESIUM  --   --   --  2.2   AST (SGOT) 18 25 30  36*  ALT 18 23 23 25    BILIRUBIN, TOTAL 0.2 0.2 0.2 0.2   ALKALINE PHOSPHATASE 76 77 75 87       Recent Labs  Lab 04/20/14  0503 04/19/14  0445 04/18/14  0626   WBC 9.54 9.21 8.37   HEMOGLOBIN 9.0* 8.8* 8.6*   HEMATOCRIT 30.2* 29.1* 28.6*   MCV 86.3 86.1 85.4   MCH, POC 25.7* 26.0* 25.7*   MCHC 29.8* 30.2* 30.1*   PLATELETS 359 433* 450*         Recent Labs      04/17/14   1903   PTT  34   PT  12.9   PT INR  1.0          Radiology Results (24 Hour)    ** No results found for the last 24 hours. **           All brain imaging (MRI, CT) personally reviewed.    Case discussed with: Daughter at length.  Signed by: Cathe Mons, MD  Spectralink: [id number]      Answering Service: [phone]

## 2014-04-20 NOTE — Progress Notes (Signed)
Pharmacy Note: Vancomycin Dosing Consult    Patient weight: 77.111 kg (170 lb)   CREATININE: 0.7 (04/20/14 0503)  Estimated creatinine clearance - 75.7 mL/min   Baseline SCr: 0.7    Nephrotoxic drugs administered in the past 48 hours: lisinopril    WBC:  Recent Labs  Lab 04/20/14  0503   WBC 9.54     Tmax in last 24h: 98.2 F    Cultures:   Date Source  Organism & Pertinent Susceptibilities     04/17/2014 Blood x 2 NG x 2 days   04/17/2014 urine Negative (Final)   04/18/2014 wound V. Light growth of yeast - susceptibilities pend          Imaging and other pertinent findings: 04/17/2014 CXR negative    Vancomycin Indication: breast abscess; possible bacteremia  Vancomycin Target Trough Level: 10-15 mg/L    Recommendations:  Initial doses given prior to consult:  1000 mg given 7/16 @ 2209                                                           1000 mg given 7/17 @ 1100 & 2354                                                           1000 mg given 7/18 @ 1030 & 2134                                                           1000 mg given 7/19 @ 0906  Maintenance regimen (15 mg/kg) = 1000 mg Q 12 hr  Vancomycin level: Trough ordered prior to 7th dose (7/19 @ 2130)    Recommendation:  Check trough prior to next dose.  Continue current dosing and adjust based on trough tonight.    Thank you,  Ara Kussmaul  Phone: [phone]

## 2014-04-20 NOTE — Progress Notes (Addendum)
PROGRESS NOTE    Date Time: 04/20/2014 10:56 AM  Patient Name: Emily [name], Emily [name]      Assessment:   Acute onset of altered mental status, encephalopathy  likely secondary to toxic metabolic encephalopathysystemic infection ,   quinolone/narcotics/ contributing   Right breast abscess, status post surgical removal of expander.s/p drain , working well, wound care consulted , continue current abx , follow on cx   History of breast cancer, right sided.s/p prior mastectomy details unknown   Q UTI but  cx neg   HTN  Hypothyroidism  HLD  GERD  Chronic anemia  Plan:    Admitted to [facility]  CT of head not significant, MRI  no acute change   Neurology consulted foll   D/c  Levaquin with ams and continue vanco, add mero ,Follow body fluid culture from rt breast with fungal elements ?, pt pretreated ?  Follow blood  culture  May discharge soon  refer to surgery Dr Chelsea Aus at [facility] as out pt on Tuesday , will try to call [facility]  To see ID susceptibility of fluid ? Sent on cipro home which she did not take   Monitor BP  Continue home medications    stable , move out of unit   Patient Active Problem List   Diagnosis   . Altered mental status           Subjective:   Feels better , not confused today   Per daughter was not on abx at home 4 days , carrying cipro ?info from d/c     Medications:     Current Facility-Administered Medications   Medication Dose Route Frequency   . aspirin  81 mg Oral Daily   . enoxaparin  40 mg Subcutaneous Daily   . lisinopril  10 mg Oral Daily   . meropenem  500 mg Intravenous 4 times per day   . pantoprazole  40 mg Oral QAM AC   . thiamine (VITAMIN B1) IVPB  100 mg Intravenous Q24H   . vancomycin  1,000 mg Intravenous Q12H [facility]               Physical Exam:     Filed Vitals:    04/20/14 0445   BP: 150/68   Pulse: 65   Temp: 97.3 F (36.3 C)   Resp: 48   SpO2: 96%         Intake and Output Summary (Last 24 hours) at Date Time    Intake/Output Summary (Last 24 hours) at 04/20/14 1056  Last data  filed at 04/19/14 2134   Gross per 24 hour   Intake      0 ml   Output     15 ml   Net    -15 ml         Physical Exam   Constitutional: Patient is awake confused , . Appears well-developed. No distress.   Head: Normocephalic and atraumatic.   Mouth/Throat: Oropharynx is clear and moist.   Eyes: EOM are normal. Pupils are equal, round, and reactive to light.   Neck: Neck supple.   Cardiovascular: Normal rate and regular rhythm.   Pulmonary/Chest: Effort normal and breath sounds normal. No respiratory distress. Rt breast s/p mastectomy , mild erythema noted with drain in place,   Abdominal: Soft. Bowel sounds are normal.   Musculoskeletal: Normal range of motion.   Neurological: Alert and oriented to person, place,       Labs:  Results    Procedure Component Value Units Date/Time    Wound Culture & Gram Stain [[id number]] Collected:  04/18/14 1906    Specimen Information:  Wound / Drainage Updated:  04/20/14 0727    Narrative:      ORDER#: [id number]                                    ORDERED BY: Neta Mends  SOURCE: Drainage rt breaast                          COLLECTED:  04/18/14 19:06  ANTIBIOTICS AT COLL.:                                RECEIVED :  04/19/14 01:52  Stain, Gram                                FINAL       04/19/14 06:39  04/19/14   No WBCs or organisms seen             No Squamous epithelial cells seen  Culture Wound                              PRELIM      04/20/14 07:27   +  04/20/14   Very light growth of Yeast               Identification to follow        Comprehensive metabolic panel [[id number]]  (Abnormal) Collected:  04/20/14 0503    Specimen Information:  Blood Updated:  04/20/14 0624     Glucose 94 mg/dL      BUN 6 (L) mg/dL      Creatinine 0.7 mg/dL      Sodium 086 mEq/L      Potassium 4.2 mEq/L      Chloride 109 mEq/L      CO2 28 mEq/L      CALCIUM 8.8 mg/dL      Protein, Total 5.7 (L) g/dL      Albumin 3.0 (L) g/dL      AST (SGOT) 18 U/L      ALT 18 U/L      Alkaline Phosphatase 76 U/L       Bilirubin, Total 0.2 mg/dL      Globulin 2.7 g/dL      Albumin/Globulin Ratio 1.1     GFR [[id number]] Collected:  04/20/14 0503     EGFR >60.0 Updated:  04/20/14 0624    CBC and differential [[id number]]  (Abnormal) Collected:  04/20/14 0503    Specimen Information:  Blood / Blood Updated:  04/20/14 0603     WBC 9.54 x10 3/uL      RBC 3.50 (L) x10 6/uL      Hgb 9.0 (L) g/dL      Hematocrit 01.0 (L) %      MCV 86.3 fL      MCH 25.7 (L) pg      MCHC 29.8 (L) g/dL      RDW 14 %      Platelets 359 x10 3/uL      MPV 10.7 fL      Neutrophils 56 %  Lymphocytes Automated 32 %      Monocytes 9 %      Eosinophils Automated 2 %      Basophils Automated 1 %      Immature Granulocyte 0 %      Nucleated RBC 0 /100 WBC      Neutrophils Absolute 5.36 x10 3/uL      Abs Lymph Automated 3.08 x10 3/uL      Abs Mono Automated 0.88 x10 3/uL      Abs Eos Automated 0.16 x10 3/uL      Absolute Baso Automated 0.06 x10 3/uL      Absolute Immature Granulocyte 0.02 x10 3/uL     CULTURE BLOOD AEROBIC AND ANAEROBIC [[id number]] Collected:  04/17/14 1903    Specimen Information:  Blood, Intravenous Line Updated:  04/20/14 0121    Narrative:      ORDER#: [id number]                                    ORDERED BY: HE, ALBERT  SOURCE: Blood, Intravenous Line peripheral           COLLECTED:  04/17/14 19:03  ANTIBIOTICS AT COLL.:                                RECEIVED :  04/18/14 00:25  Culture Blood Aerobic and Anaerobic        PRELIM      04/20/14 01:21  04/19/14   No Growth after 1 day/s of incubation.  04/20/14   No Growth after 2 day/s of incubation.      CULTURE BLOOD AEROBIC AND ANAEROBIC [[id number]] Collected:  04/17/14 1903    Specimen Information:  Blood, Intravenous Line Updated:  04/20/14 0121    Narrative:      ORDER#: [id number]                                    ORDERED BY: HE, ALBERT  SOURCE: Blood, Intravenous Line peripheral           COLLECTED:  04/17/14 19:03  ANTIBIOTICS AT COLL.:                                RECEIVED :   04/18/14 00:25  Culture Blood Aerobic and Anaerobic        PRELIM      04/20/14 01:21  04/19/14   No Growth after 1 day/s of incubation.  04/20/14   No Growth after 2 day/s of incubation.              Recent Labs  Lab 04/20/14  0503 04/19/14  0445 04/18/14  0626   WBC 9.54 9.21 8.37   HEMOGLOBIN 9.0* 8.8* 8.6*   HEMATOCRIT 30.2* 29.1* 28.6*   PLATELETS 359 433* 450*       Recent Labs  Lab 04/20/14  0503 04/19/14  0445 04/18/14  0626   SODIUM 145 144 145   POTASSIUM 4.2 4.1 4.6   CHLORIDE 109 107 107   CO2 28 28 32*   BUN 6* 6* 9   CREATININE 0.7 0.7 0.7   EGFR >60.0 >60.0 >60.0   GLUCOSE 94 99 106*   CALCIUM 8.8 8.7 8.9  Rads:   Radiological Procedure reviewed.  Radiology Results (24 Hour)    Procedure Component Value Units Date/Time    MRI Laqueta Jean WO Contrast [[id number]] Collected:  04/19/14 1113    Order Status:  Completed Updated:  04/19/14 1120    Narrative:      HISTORY:  Acute encephalopathy. Unremarkable CT.    TECHNIQUE: An MRI of the brain was performed utilizing the following  pulse sequences:  sagittal and axial T1-weighted , T2-weighted axial,  FLAIR axial, diffusion weighted axial images of the brain were obtained.   Following IV contrast administration, T1-weighted axial and T1-weighted  coronal images were obtained.    PRIORS: None.     FINDINGS: There is presence of mild volume loss with bilateral,  periventricular areas of white matter hyperintensity signal consistent  with small vessel ischemic changes. The ventricular system is normal in  size and contour for patient's age. There is no intracranial hemorrhage.  There is no herniation. There are no extraaxial fluid collections. The  diffusion sequences show no restriction to suggest an acute infarct.  After the uneventful intravenous administration of contrast was no  evidence of pathologic enhancement..  The visualized portion of the paranasal sinuses are clear. The  cerebellar tonsils are normally positioned.      Impression:        Minimal small vessel disease. No acute vascular abnormality  or abnormal area of enhancement.    Virgel Paling, MD   04/19/2014 11:16 AM              Signed by: Dorathy Daft

## 2014-04-20 NOTE — Progress Notes (Signed)
Severe Sepsis Screen    Date: 04/20/2014 Time: 12:07 AM  Nurse Signature: Prudencio Pair Shaila Gilchrest    Exclusions:      Patients meeting the following criteria are excluded from screening:     []  Suspicion or diagnosis of sepsis is documented and until 72 hours after antibiotics started or last regimen change:   - If Yes, Date of Documented Sepsis:                                          - If Yes, Date of last change in antibiotics:                                           []  Surgery - No screening for 24 hours after surgery   - If Yes, Date of Surgery:                                           []  Arctic Sun hypothermia protocol- Resume screening when arctic sun complete   []  Comfort Care Orders- Do not resume screening    Did you check any of the boxes above?     [x]  No, Continue to section A   []  Yes, Stop Here, Patient Excluded from Sepsis Screening. If screening should resume in the future, place "sticky note to treatment team" with date/time of when screening should resume. Communicate patient excluded from screening due to Comfort Care Orders using "sticky note to treatment team."    A. Infection:      Does your patient have ONE or more of the following infection criteria?     []  Documented Infection - Does the patient have positive culture results (from blood, sputum, urine, etc)?   [x]  Anti-Infective Therapy - Is the patient receiving antibiotic, antifungal, or other anti-infective therapy?   []  Pneumonia - Is there documentation of pneumonia (X Ray, etc)?   []  WBC's - Have WBC's been found in normally sterile fluid (urine, CSF, etc.)?   []  Perforated Viscus - Does the patient have a perforated hollow organ (bowel)?    A.  Did you check any of the boxes above?     []  No, Stop Here and Sepsis Screen Negative   [x]  Yes, continue to section B    B. SIRS:      Does your patient have TWO or more of the following SIRS criteria?     []  Temperature - Is the patient's temperature: Temp: 96.5 F (35.8 C) (04/19/14  2335)   - Greater than or equal to 38.3 degrees C (greater than 100.9 degrees F)?   - Less than or equal to 36 degrees C (less than or equal to 96.8 degrees F)?     []  Heart Rate: Heart Rate: 61 (04/19/14 2335)   - Is the patient's heart rate greater than or equal to 90 bpm?     []  Respiratory: Resp Rate: 18 (04/19/14 2335)   - Is the patient's respiratory rate greater than 20?     []  WBC Count - Is the patient's WBC count:   Recent Labs  Lab 04/19/14  0445   WBC 9.21       -  Greater than or equal to 12,000/mm3 OR   - Less than or equal to 4,000/mm3 OR    - Are there greater than 10% immature neutrophils (bands)?     []  Glucose >140 without diabetes or on steroids?   Recent Labs  Lab 04/19/14  0445   GLUCOSE 99         []  Significant edema is present?    B.  Did you check two or more of the boxes above?     [x]  No, Stop Here and Sepsis Screen Negative   []  Yes, contact Charge Nurse, continue to section C    C. ACUTE Organ Dysfunction:      Does your patient have ONE or more of the following organ dysfunction? (May need to wait for lab results for assessment - see below) Organ dysfunction must be a result of the sepsis NOT CHRONIC conditions.     []  Cardiovascular - Does the patient have a: BP: 176/72 mmHg (04/19/14 2335)   - Systolic Blood Pressure less than or equal to 90 mmHg OR   - Systolic Blood Pressure has dropped 40 mmHg or more from baseline OR   - Mean Arterial Pressure less than or equal to 70 mmHg (for at least one hour despite fluid resuscitation OR   - require vasopressor support?     []  Respiratory - Does the patient have new hypoxia defined by any of the following?   - A sustained increase in oxygen requirements by at least 2L/min on NC or 28% FiO2 within the last 24 hrs OR   - A persistent decrease in oxygen saturation of greater than or equal to 5% lasting at least four or more hours and occurring within the last 24 hours     []  Renal - Does the patient have:   - low urine output (e.g.  Less than 0.5 mL/kg/HR for one hour despite adequate fluid resuscitation OR   - Increased creatinine (greater than 50% increase from baseline) OR   - require acute dialysis?     []  Hematologic - Does the patient have:   - Low platelet count (less than 100,000 mm3)   Recent Labs  Lab 04/19/14  0445   PLATELETS 433*    OR   - INR/aPTT greater than upper limit of normal?   Recent Labs  Lab 04/17/14  1903   PT INR 1.0    OR No results for input(s): APTT in the last 168 hours.     []  Metabolic - Does the patient have a high lactate (plasma lactate greater than or equal to 2.4 mMol/L?   Recent Labs  Lab 04/17/14  1903   LACTIC ACID 1.2         []  Hepatic - Are the patient's liver enzymes elevated (ALT greater than 72 IU/L or Total Bilirubin greater than 2 MG/dL)?   Recent Labs  Lab 04/19/14  0445   BILIRUBIN, TOTAL 0.2   ALT 23         []  CNS - Does the patient have altered consciousness or reduced Glasgow Coma Scale?     Other Active Diagnoses that may be contributing to signs of end organ dysfunction (Ex. Chronic kidney disease, cirrhosis):   - ________________________________________________________________      C.  Did you check any of the boxes above?     []  No, Sepsis Screen Negative   []  YES:  A) Infection + B) SIRS + C) Acute Organ Dysfunction = Positive Screen for  Severe Sepsis. Notify attending (house officer during off-hours).     Notify Attending/House Garment/textile technologist and document in Complex Assessment under provider notification   - Name of physician notified:                                           - Date/Time Notifiied:                                             Document actions:   _0  Lactate drawn   _1  Blood Cultures obtained   _2  Antibiotics initiated or modified   _3   IV Fluid administered 0.9% NS __________ mLs given   Nursing Comments/Narrative:    - ______________________________________________________________     The Surviving Sepsis Guidelines recommend the following interventions to be  completed within one hour of a positive sepsis screen.   Obtain new blood cultures prior to antibiotic administration if not done within the last 24 hours.   Obtain lactate level, if initial lactate > 60mol, repeat lactate in 2 hours for goal decrease 10-20%; If there is not a decrease call [facility], hospital  If SBP < 90 or MAP < 65 or lactate greater than 4 mmol/dl; Start 0.9% NS IV Fluid Bolus of 30 mL/Kg (minimum) to maintain MAP > 65    Initiate vasopressors for hypotension not responding to fluid resuscitation (1st line Norepinephrine 1 - 300 mcg/min IV) - Patient must be in CCU if requires vasopressor   Initiate or escalate antibiotic therapy   Goal urine output greater than/equal to 0.5 mL/kg/hr

## 2014-04-21 ENCOUNTER — Inpatient Hospital Stay: Payer: Commercial Managed Care - POS

## 2014-04-21 LAB — ECG 12-LEAD
Atrial Rate: 83 {beats}/min
P Axis: 57 degrees
P-R Interval: 158 ms
Q-T Interval: 366 ms
QRS Duration: 88 ms
QTC Calculation (Bezet): 430 ms
R Axis: 44 degrees
T Axis: 33 degrees
Ventricular Rate: 83 {beats}/min

## 2014-04-21 MED ORDER — NYSTOP 100000 UNIT/GM EX POWD
1.0000 | Freq: Two times a day (BID) | CUTANEOUS | Status: DC
Start: 2014-04-21 — End: 2014-04-23
  Administered 2014-04-21 – 2014-04-23 (×5): 1 via TOPICAL
  Filled 2014-04-21: qty 15

## 2014-04-21 NOTE — Consults (Signed)
Reason for Consultation:   Right breast wound      History of present illness:  Emily [name] had surgery 7/12 to remove an expander from her right breast at [facility], she went home but failed to fill her antibiotic prescription and was admitted to [place] due to AMS and an UTI.       Wound assessment:  Incision over right breast is tender to the touch, dry, skin puckered with JP drain sewn in place, sanguinous drainage. No redness or raised warmth felt/ noted.       Plan:  No dressings needed at this time  Wound culture 7/17 shows light growth of yeast, Nystatin powder ordered    Loyal Jacobson RN [facility] ext [phone]

## 2014-04-21 NOTE — Progress Notes (Signed)
Date Time: 04/21/2014 5:09 PM  Patient Name: Emily [name]  Attending Physician: Marcelino Freestone, MD  Patient Class: Inpatient  Hospital Day: 4            NEUROLOGY PROGRESS NOTE             Assessment/Plan   Acute onset of altered mental status, encephalopathy, likely toxic metabolic, from infection, and also from thiamine deficiency.  Possible Wernicke's encephalopathy given less than 7, thiamine level.  History of high blood pressure    Continue to replace thiamine, will change to 500 IV 3 times a day for 2 days and then 250 IV.  Will follow      Subjective:   Patient Seen and Examined.  Doing significantly better, now has significant recollection about what happened to her, family seems to believe that she is doing markedly better as well.    Review of Systems:   No headache, eye, ear nose, throat problems; no coughing or wheezing or shortness of breath, No chest pain or orthopnea, no abdominal pain, nausea or vomiting, No pain in the body or extremities, no psychiatric, neurological, endocrine, hematological or cardiac complaints except as noted above.        Physical Exam:   BP 150/61 mmHg  Pulse 58  Temp(Src) 98.1 F (36.7 C) (Oral)  Resp 20  Ht 1.6 m (5\' 3" )  Wt 77.111 kg (170 lb)  BMI 30.12 kg/m2  SpO2 99%    Neuro:  Level of consciousness:  Alert and appropriate  Oriented:  X 3 .  Speech is fluent.  Repetition, naming, compression normal.  Facial Movements: symmetric  pupils are equal and reactive   Strength:  No upper extremity drift  Sensation to light touch: Intact bilaterally  HEENT: Normocephalic. No icter or congestion    Neck: supple, no lymphadenopathy, no thyromegaly, no JVD    CV: RRR, No murmers, rubs or gallops    Pulm: Clear to auscultation BIL, nonlabored     Abd: soft, NT/ND, normoactive BS    Extremities: Wound VAC, in place.  scar, right breast   Skin: no rashes or lesions noted    Meds:      Scheduled Meds: PRN Meds:        aspirin 81 mg Oral Daily   enoxaparin 40 mg Subcutaneous  Daily   fluconazole 100 mg Oral Q24H SCH   lisinopril 10 mg Oral Daily   meropenem 500 mg Intravenous 4 times per day   nystatin 1 application Topical BID   pantoprazole 40 mg Oral QAM AC   thiamine (VITAMIN B1) IVPB 500 mg Intravenous TID   vancomycin 1,000 mg Intravenous Q12H [facility]       Continuous Infusions:      acetaminophen 650 mg Q6H PRN               Labs:       Recent Labs  Lab 04/20/14  0503 04/19/14  0445 04/18/14  0626 04/17/14  1903   GLUCOSE 94 99 106* 104*   BUN 6* 6* 9 11   CREATININE 0.7 0.7 0.7 0.7   CALCIUM 8.8 8.7 8.9 9.3   SODIUM 145 144 145 143   POTASSIUM 4.2 4.1 4.6 4.3   CHLORIDE 109 107 107 101   CO2 28 28 32* 33*   ALBUMIN 3.0* 3.0* 3.0* 3.4*   MAGNESIUM  --   --   --  2.2   AST (SGOT) 18 25 30  36*   ALT  18 23 23 25    BILIRUBIN, TOTAL 0.2 0.2 0.2 0.2   ALKALINE PHOSPHATASE 76 77 75 87       Recent Labs  Lab 04/20/14  0503 04/19/14  0445 04/18/14  0626   WBC 9.54 9.21 8.37   HEMOGLOBIN 9.0* 8.8* 8.6*   HEMATOCRIT 30.2* 29.1* 28.6*   MCV 86.3 86.1 85.4   MCH, POC 25.7* 26.0* 25.7*   MCHC 29.8* 30.2* 30.1*   PLATELETS 359 433* 450*         No results for input(s): PTT, PT, INR in the last 72 hours.       Radiology Results (24 Hour)    Procedure Component Value Units Date/Time    US Breast Right Ltd [[id number]] Collected:  04/21/14 1513    Order Status:  Completed Updated:  04/21/14 1518    Narrative:      Indication: Evaluate for abscess.    FINDINGS: In the right breast between 10 and 12:00 there is abnormal  tissue with thickening and heterogeneity. An abscess cannot be excluded.  There is no discrete mass. There is subcutaneous edema as well.      Impression:       Heterogeneous irregular tissue in the right breast between  10 and 12:00. Abscess cannot be excluded. This could be further  evaluated with CT.    Kinnie Feil, MD   04/21/2014 3:14 PM             All brain imaging (MRI, CT) personally reviewed.    Case discussed with:    Signed by: Cathe Mons, MD  Spectralink: [phone]       Answering Service: [phone]

## 2014-04-21 NOTE — Progress Notes (Signed)
PROGRESS NOTE    Date Time: 04/21/2014 4:35 PM  Patient Name: Emily [name], Emily [name]  Length of Stay: 4    Subjective:   I am ok.No chill.No sob.    Medications:     Current Facility-Administered Medications   Medication Dose Route Frequency   . aspirin  81 mg Oral Daily   . enoxaparin  40 mg Subcutaneous Daily   . fluconazole  100 mg Oral Q24H SCH   . lisinopril  10 mg Oral Daily   . meropenem  500 mg Intravenous 4 times per day   . nystatin  1 application Topical BID   . pantoprazole  40 mg Oral QAM AC   . thiamine (VITAMIN B1) IVPB  500 mg Intravenous TID   . vancomycin  1,000 mg Intravenous Q12H SCH         Review of Systems:   A comprehensive review of systems was: General ROS: negative for - chills  Respiratory ROS: negative for - hemoptysis  Cardiovascular ROS: negative for - chest pain  Gastrointestinal ROS: negative for - diarrhea  Musculoskeletal ROS: negative for - joint swelling  Neurological ROS: negative for - seizures    Physical Exam:     Filed Vitals:    04/21/14 1329   BP: 150/61   Pulse: 58   Temp: 98.1 F (36.7 C)   Resp: 20   SpO2: 99%       Intake and Output Summary (Last 24 hours) at Date Time    Intake/Output Summary (Last 24 hours) at 04/21/14 1635  Last data filed at 04/21/14 0541   Gross per 24 hour   Intake      0 ml   Output    320 ml   Net   -320 ml       Ears - bilateral TM's and external ear canals normal  Nose - normal and patent, no erythema, discharge or polyps  Mouth - mucous membranes moist, pharynx normal without lesions  Neck - supple, no significant adenopathy  Lymphatics - no palpable lymphadenopathy  Chest - no tachypnea, retractions or cyanosis  Heart - S1 and S2 normal  Abdomen - no rebound tenderness noted  Extremities - no pedal edema noted    Labs:     Results    Procedure Component Value Units Date/Time    Wound Culture & Gram Stain [[id number]] Collected:  04/18/14 1906    Specimen Information:  Wound / Drainage Updated:  04/21/14 0638    Narrative:      ORDER#: [id number]                                     ORDERED BY: Neta Mends  SOURCE: Drainage rt breaast                          COLLECTED:  04/18/14 19:06  ANTIBIOTICS AT COLL.:                                RECEIVED :  04/19/14 01:52  Stain, Gram                                FINAL       04/19/14 06:39  04/19/14   No WBCs or  organisms seen             No Squamous epithelial cells seen  Culture Wound                              FINAL       04/21/14 06:38   +  04/20/14   Very light growth of Yeast          CULTURE BLOOD AEROBIC AND ANAEROBIC [[id number]] Collected:  04/17/14 1903    Specimen Information:  Blood, Intravenous Line Updated:  04/21/14 0121    Narrative:      ORDER#: [id number]                                    ORDERED BY: HE, ALBERT  SOURCE: Blood, Intravenous Line peripheral           COLLECTED:  04/17/14 19:03  ANTIBIOTICS AT COLL.:                                RECEIVED :  04/18/14 00:25  Culture Blood Aerobic and Anaerobic        PRELIM      04/21/14 01:21  04/19/14   No Growth after 1 day/s of incubation.  04/20/14   No Growth after 2 day/s of incubation.  04/21/14   No Growth after 3 day/s of incubation.      CULTURE BLOOD AEROBIC AND ANAEROBIC [[id number]] Collected:  04/17/14 1903    Specimen Information:  Blood, Intravenous Line Updated:  04/21/14 0121    Narrative:      ORDER#: [id number]                                    ORDERED BY: HE, ALBERT  SOURCE: Blood, Intravenous Line peripheral           COLLECTED:  04/17/14 19:03  ANTIBIOTICS AT COLL.:                                RECEIVED :  04/18/14 00:25  Culture Blood Aerobic and Anaerobic        PRELIM      04/21/14 01:21  04/19/14   No Growth after 1 day/s of incubation.  04/20/14   No Growth after 2 day/s of incubation.  04/21/14   No Growth after 3 day/s of incubation.      Vancomycin, trough [[id number]] Collected:  04/20/14 2134    Specimen Information:  Blood Updated:  04/20/14 2202     Vancomycin Trough 16.4 ug/mL      Vancomycin Time of Last Dose unk.       Vancomycin Date of Last Dose //unk.     Narrative:      Please draw 30 min prior to dose on 7/19 @ 2130.  Dose is due  7/19 @ 2200.          Recent CBC No results for input(s): RBC, HGB, HCT, MCV, MCH, MCHC, RDW, MPV, LABPLAT in the last 24 hours.    Invalid input(s): WHITEBLOODCE,  NRBCA,  REFLX,  ANRBA  Recent BMP No results for input(s): GLU, BUN, CREAT, CA, NA, K, CL, CO2 in  the last 24 hours.    Invalid input(s): AGAP    Rads:   Radiological Procedure reviewed.    Assessment:   AMS/memory loss/Encephalopathy   UTI  ?Right breast abscess with drainage tube  HTN  Hypothyroidism  HLD  GERD    Plan:   MRI of head not significant  Encephalopathy not related to breast abscess for now, as no sign of sepsis  Neurology follow up  Continue iv thiamine  Continue iv abx  [place] of rt breast not conclusive  Pt c/o rt breast tenderness  CT of chest to r/o rt breast abscess  Continue iv abx  Blood culture was negative  Pt/ot   Continue current management  Lab in am      Signed by: Manuela Neptune

## 2014-04-21 NOTE — Progress Notes (Signed)
>>   Emily [name] A 04/21/2014 01:05  Pharmacy Note: Vancomycin Monitoring Consult    Day # Vancomycin: 4    CREATININE: 0.7 (04/20/14 0503)  Estimated creatinine clearance - 75.7 mL/min    Nephrotoxic drugs administered in the past 48 hours: Lisinopril    WBC:   Recent Labs  Lab 04/20/14  0503   WBC 9.54     Tmax in last 24h: 98.8    Cultures:   Date Source  Organism & Pertinent Susceptibilities     04/17/2014 Blood x 2 NG x 2 days   04/17/2014 urine Negative (Final)   04/18/2014 wound V. Light growth of yeast - susceptibilities pend          Imaging and other pertinent findings: 04/17/2014 CXR negative.      Vancomycin indication: Abscess, right breast, s/p surgical removal tissue expander with drain in place.    Current vancomycin regimen: 1000 mg Q 12 hr  Vancomycin target trough level: 10 - 15 mg/L  Measured Vancomycin level results (date/time): 16.4 mg/L 7/19 @ 2134    Assessment/Plan:  Scr is stable.  Trough level was drawn at correct time and result was 16.4 mg/L.  Considering that abscess involved surgical removal of foreign object and with drains in place, I feel that this level is appropriate.  Continue current regimen of 1 gram q12h and check trough 04/23/14 @ 0930.      Thank you,  Jobe Gibbon  Phone: [phone]

## 2014-04-21 NOTE — Plan of Care (Signed)
Problem: Health Promotion  Goal: Knowledge - disease process  Extent of understanding conveyed about a specific disease process.   Outcome: Progressing  Pt. Is alert and oriented x3, neurologically intact, no episodes of forgetfulness noted, in no acute respiratory distress, VSS, on RA, ambulatory, voiding, IV ABX given as ordered, tolerated dinner, continue to monitor.    Problem: Pain  Goal: Patient's pain/discomfort is manageable  Outcome: Progressing

## 2014-04-21 NOTE — Plan of Care (Signed)
Problem: Safety  Goal: Patient will be free from injury during hospitalization  Outcome: Progressing  Pt. Uses call light appropriately, environment clean, safety maintained.    Problem: Pain  Goal: Patient's pain/discomfort is manageable  Outcome: Progressing  C/o mild right under arm pain, was medicated with Tylenol 650 mg po and reposition with pillow, reports relief from pain after.

## 2014-04-22 ENCOUNTER — Inpatient Hospital Stay: Payer: Commercial Managed Care - POS

## 2014-04-22 MED ORDER — IOHEXOL 350 MG/ML IV SOLN
INTRAVENOUS | Status: AC
Start: 2014-04-22 — End: 2014-04-22
  Administered 2014-04-22: 100 mL via INTRAVENOUS
  Filled 2014-04-22: qty 100

## 2014-04-22 MED ORDER — CIPROFLOXACIN HCL 250 MG PO TABS
500.0000 mg | ORAL_TABLET | Freq: Two times a day (BID) | ORAL | Status: DC
Start: 2014-04-22 — End: 2014-04-23
  Administered 2014-04-22 – 2014-04-23 (×2): 500 mg via ORAL
  Filled 2014-04-22 (×2): qty 2

## 2014-04-22 MED ORDER — IOHEXOL 350 MG/ML IV SOLN
100.0000 mL | Freq: Once | INTRAVENOUS | Status: AC | PRN
Start: 2014-04-22 — End: 2014-04-22

## 2014-04-22 MED ORDER — CEFDINIR 300 MG PO CAPS
300.0000 mg | ORAL_CAPSULE | Freq: Two times a day (BID) | ORAL | Status: DC
Start: 2014-04-22 — End: 2014-04-23
  Administered 2014-04-22 – 2014-04-23 (×2): 300 mg via ORAL
  Filled 2014-04-22 (×2): qty 1

## 2014-04-22 NOTE — Progress Notes (Signed)
Discussed case with Dr. Holley Dexter.  Pt recently had mastectomy with Dr. Chelsea Aus at [facility] and still has a surgical drain in place.  I explained to Dr. Holley Dexter that if he has concerns about the pt's breast, he needs to discuss these with Dr. Chelsea Aus.  Chong Sicilian, PA-C [facility] # [phone]

## 2014-04-22 NOTE — Progress Notes (Signed)
IV access lost around 2:00pm, attempts made to start a new IV line without success. Unable to give pm IVF scheduled  antibiotics. Contacted ED and CCU for assistance both reported they were busy and unable to help. Attending MD notified of the situation.

## 2014-04-22 NOTE — Progress Notes (Signed)
PROGRESS NOTE    Date Time: 04/22/2014 5:13 PM  Patient Name: Emily [name], Emily [name]  Length of Stay: 5    Subjective:   I feel ok.No chill.No sob.     Medications:     Current Facility-Administered Medications   Medication Dose Route Frequency   . aspirin  81 mg Oral Daily   . enoxaparin  40 mg Subcutaneous Daily   . fluconazole  100 mg Oral Q24H SCH   . lisinopril  10 mg Oral Daily   . meropenem  500 mg Intravenous 4 times per day   . nystatin  1 application Topical BID   . pantoprazole  40 mg Oral QAM AC   . thiamine (VITAMIN B1) IVPB  500 mg Intravenous TID   . vancomycin  1,000 mg Intravenous Q12H SCH         Review of Systems:   A comprehensive review of systems was: General ROS: negative for - chills  Respiratory ROS: negative for - hemoptysis  Cardiovascular ROS: negative for - chest pain  Gastrointestinal ROS: negative for - diarrhea  Musculoskeletal ROS: negative for - joint swelling  Neurological ROS: negative for - seizures    Physical Exam:     Filed Vitals:    04/22/14 1337   BP: 152/69   Pulse: 58   Temp: 97 F (36.1 C)   Resp: 18   SpO2:        Intake and Output Summary (Last 24 hours) at Date Time  No intake or output data in the 24 hours ending 04/22/14 1713    Ears - bilateral TM's and external ear canals normal  Nose - normal and patent, no erythema, discharge or polyps  Mouth - mucous membranes moist, pharynx normal without lesions  Neck - supple, no significant adenopathy  Lymphatics - no palpable lymphadenopathy  Chest - no tachypnea, retractions or cyanosis  Heart - S1 and S2 normal  Abdomen - no rebound tenderness noted  Extremities - no pedal edema noted    Labs:     Results    Procedure Component Value Units Date/Time    CULTURE BLOOD AEROBIC AND ANAEROBIC [[id number]] Collected:  04/17/14 1903    Specimen Information:  Blood, Intravenous Line Updated:  04/22/14 0121    Narrative:      ORDER#: [id number]                                    ORDERED BY: HE, ALBERT  SOURCE: Blood, Intravenous Line  peripheral           COLLECTED:  04/17/14 19:03  ANTIBIOTICS AT COLL.:                                RECEIVED :  04/18/14 00:25  Culture Blood Aerobic and Anaerobic        PRELIM      04/22/14 01:21  04/19/14   No Growth after 1 day/s of incubation.  04/20/14   No Growth after 2 day/s of incubation.  04/21/14   No Growth after 3 day/s of incubation.  04/22/14   No Growth after 4 day/s of incubation.      CULTURE BLOOD AEROBIC AND ANAEROBIC [[id number]] Collected:  04/17/14 1903    Specimen Information:  Blood, Intravenous Line Updated:  04/22/14 0121    Narrative:  ORDER#: [id number]                                    ORDERED BY: HE, ALBERT  SOURCE: Blood, Intravenous Line peripheral           COLLECTED:  04/17/14 19:03  ANTIBIOTICS AT COLL.:                                RECEIVED :  04/18/14 00:25  Culture Blood Aerobic and Anaerobic        PRELIM      04/22/14 01:21  04/19/14   No Growth after 1 day/s of incubation.  04/20/14   No Growth after 2 day/s of incubation.  04/21/14   No Growth after 3 day/s of incubation.  04/22/14   No Growth after 4 day/s of incubation.            Recent CBC No results for input(s): RBC, HGB, HCT, MCV, MCH, MCHC, RDW, MPV, LABPLAT in the last 24 hours.    Invalid input(s): WHITEBLOODCE,  NRBCA,  REFLX,  ANRBA  Recent BMP No results for input(s): GLU, BUN, CREAT, CA, NA, K, CL, CO2 in the last 24 hours.    Invalid input(s): AGAP    Rads:   Radiological Procedure reviewed.    Assessment:   AMS/memory loss/Encephalopathy   ?Right breast abscess with drainage tube  HTN  Hypothyroidism  HLD  GERD    Plan:   MRI of head not significant  Neurology follow up  Continue iv thiamine  Continue iv abx  CT of chest showed some phlegmon 2 by 9 cm  Discussed with Dr Azucena Kuba [phone] and no additional drainage needed now as no fever ,no wbc and d/c with po abx cipro and Duricef and follow up his office [phone] on Monday.  Continue iv abx  Blood culture was negative  Continue Pt/ot    Continue current management      Signed by: Ophelia Charter Lulla Linville,MD

## 2014-04-22 NOTE — Progress Notes (Signed)
Date Time: 04/22/2014 3:42 PM  Patient Name: Emily [name]  Attending Physician: Marcelino Freestone, MD  Patient Class: Inpatient  Hospital Day: 5            NEUROLOGY PROGRESS NOTE             Assessment/Plan   Acute onset of altered mental status, encephalopathy, likely toxic metabolic, from infection, and also from thiamine deficiency.  Possible Wernicke's encephalopathy given less than 7, thiamine level.  History of high blood pressure    Continue to replace thiamine, will change to 500 IV 3 times a day for 2 days  Is done and then 250 IV.is needed for 5 days   May be we can make arrangement for home infusion  Will follow      Subjective:   Patient Seen and Examined.  Doing significantly better, now has significant recollection about what happened to her, family seems to believe that she is doing markedly better as well.    Review of Systems:   No headache, eye, ear nose, throat problems; no coughing or wheezing or shortness of breath, No chest pain or orthopnea, no abdominal pain, nausea or vomiting, No pain in the body or extremities, no psychiatric, neurological, endocrine, hematological or cardiac complaints except as noted above.        Physical Exam:   BP 152/69 mmHg  Pulse 58  Temp(Src) 97 F (36.1 C) (Oral)  Resp 18  Ht 1.6 m (5\' 3" )  Wt 77.111 kg (170 lb)  BMI 30.12 kg/m2  SpO2 98%    Neuro:  Level of consciousness:  Alert and appropriate smiling   Oriented:  X 3 .  Speech is fluent.  Repetition, naming, compression normal.  Facial Movements: symmetric  pupils are equal and reactive   Strength:  No upper extremity drift  Sensation to light touch: Intact bilaterally  HEENT: Normocephalic. No icter or congestion    Neck: supple,   CV: RRR,     Pulm: Clear to auscultation BIL,    Abd: soft, NT/ND, normoactive BS    Extremities: Wound VAC, in place scar, right breast       Meds:      Scheduled Meds: PRN Meds:        aspirin 81 mg Oral Daily   enoxaparin 40 mg Subcutaneous Daily   fluconazole 100 mg Oral  Q24H SCH   lisinopril 10 mg Oral Daily   meropenem 500 mg Intravenous 4 times per day   nystatin 1 application Topical BID   pantoprazole 40 mg Oral QAM AC   thiamine (VITAMIN B1) IVPB 500 mg Intravenous TID   vancomycin 1,000 mg Intravenous Q12H [facility]       Continuous Infusions:      acetaminophen 650 mg Q6H PRN               Labs:       Recent Labs  Lab 04/20/14  0503 04/19/14  0445 04/18/14  0626 04/17/14  1903   GLUCOSE 94 99 106* 104*   BUN 6* 6* 9 11   CREATININE 0.7 0.7 0.7 0.7   CALCIUM 8.8 8.7 8.9 9.3   SODIUM 145 144 145 143   POTASSIUM 4.2 4.1 4.6 4.3   CHLORIDE 109 107 107 101   CO2 28 28 32* 33*   ALBUMIN 3.0* 3.0* 3.0* 3.4*   MAGNESIUM  --   --   --  2.2   AST (SGOT) 18 25 30  36*  ALT 18 23 23 25    BILIRUBIN, TOTAL 0.2 0.2 0.2 0.2   ALKALINE PHOSPHATASE 76 77 75 87       Recent Labs  Lab 04/20/14  0503 04/19/14  0445 04/18/14  0626   WBC 9.54 9.21 8.37   HEMOGLOBIN 9.0* 8.8* 8.6*   HEMATOCRIT 30.2* 29.1* 28.6*   MCV 86.3 86.1 85.4   MCH, POC 25.7* 26.0* 25.7*   MCHC 29.8* 30.2* 30.1*   PLATELETS 359 433* 450*         No results for input(s): PTT, PT, INR in the last 72 hours.       Radiology Results (24 Hour)    Procedure Component Value Units Date/Time    CT Chest W Contrast [[id number]] Collected:  04/22/14 1111    Order Status:  Completed Updated:  04/22/14 1120    Narrative:      INDICATION: Right breast abscess and swelling. History of mastectomy    TECHNIQUE:  Helical CT scan of the chest was obtained from the apices to  the lung bases after the uneventful administration of nonionic  intravenous  contrast. 100 cc Omnipaque 350 was utilized  intravenously.Coronal and sagittal reconstruction images performed.    FINDINGS:    The inferior thyroid gland demonstrates nodules in the right lobe and  isthmus which measure 5.7 mm and 3.1 mm.  Nonenlarged axillary lymph nodes.  No mediastinal or hilar adenopathy.  No pleural or pericardial effusion. Multiple calcified granulomas at the  right lung base with  pleural thickening.  The aorta is unremarkable.  Pulmonary arteries demonstrate no filling defects to indicate pulmonary  emboli.  Bone windows demonstrate degenerative bone spurs throughout the thoracic  spine. No osseous destruction.  Lung windows demonstrate biapical interstitial scarring. No acute  infiltrate. Linear scarring or subsegmental atelectasis at both lung  bases. Pleural thickening and both lung bases.  Small hiatal hernia. Fatty infiltration of the liver without focal mass.  Spleen is normal in size. Cyst in the upper pole of the kidney measuring  2.2 cm. It has Hounsfield unit numbers of a simple cyst.  Adrenal glands are the upper limit of normal in size.  Evaluation of the surgical bed in the right mastectomy site demonstrates  a drain present. There is extensive phlegmon measuring 2.2 cm in AP  dimension x 9.9 cm transversely. There is skin thickening throughout  this site. There is no drainable abscess.      Impression:       Phlegmon at the mastectomy site surrounding the catheter.  Skin thickening. No drainable fluid collection. No underlying osseous  destruction.  Simple left renal cyst. Fatty infiltration of the liver. Small hiatal  hernia.  Bilateral pleural thickening, granulomatous changes. No acute  infiltrate.  Thyroid nodules.           Kinnie Feil, MD   04/22/2014 11:15 AM             All brain imaging (MRI, CT) personally reviewed.    Case discussed with:    Signed by: Cathe Mons, MD  Spectralink: [phone]      Answering Service: [phone]

## 2014-04-22 NOTE — Plan of Care (Signed)
Problem: Safety  Goal: Patient will be free from injury during hospitalization  Outcome: Progressing  Patient oriented on fall risk prevention. Non-slip footwear placed on the patient and patient resting in bed. Bedside table, belongings, and call light within reach. Patient resting in bed and educated on medications received this evening    Problem: Pain  Goal: Patient's pain/discomfort is manageable  Outcome: Progressing  Patient reports no complaints of pain

## 2014-04-22 NOTE — Plan of Care (Signed)
Problem: Safety  Goal: Patient will be free from injury during hospitalization  Outcome: Progressing   Pt educated on safety precautions. Fall/safety precautions maintained.    Problem: Pain  Goal: Patient's pain/discomfort is manageable  Outcome: Progressing  Pt alert and oriented x 4, complained of mild discomfort to the right breast, offered Tylenol for pain, states "Am Ok."  IVF antibiotics given per schedule.

## 2014-04-23 MED ORDER — THIAMINE HCL 100 MG PO TABS
100.0000 mg | ORAL_TABLET | Freq: Every day | ORAL | Status: AC
Start: 2014-04-23 — End: 2014-05-23

## 2014-04-23 MED ORDER — THIAMINE HCL 100 MG PO TABS
200.0000 mg | ORAL_TABLET | Freq: Once | ORAL | Status: DC
Start: 2014-04-23 — End: 2014-04-23
  Filled 2014-04-23: qty 2

## 2014-04-23 MED ORDER — CIPROFLOXACIN HCL 500 MG PO TABS
500.0000 mg | ORAL_TABLET | Freq: Two times a day (BID) | ORAL | Status: AC
Start: 2014-04-23 — End: 2014-05-03

## 2014-04-23 MED ORDER — CEFDINIR 300 MG PO CAPS
300.0000 mg | ORAL_CAPSULE | Freq: Two times a day (BID) | ORAL | Status: AC
Start: 2014-04-23 — End: 2014-05-03

## 2014-04-23 MED ORDER — THIAMINE HCL 100 MG PO TABS
100.0000 mg | ORAL_TABLET | Freq: Every day | ORAL | Status: DC
Start: 2014-04-24 — End: 2014-04-23

## 2014-04-23 NOTE — Progress Notes (Addendum)
Patient is discharged to go to home. Discharge instructions & prescriptions given to patient. Patient verbalizes understanding. Waiting for the ride.

## 2014-04-23 NOTE — Progress Notes (Signed)
Patient discharged with family via wheelchair, escorted by [employer], Lanora Manis.  Left at 1935.

## 2014-04-23 NOTE — Discharge Summary (Signed)
DISCHARGE NOTE    Date Time: 04/23/2014 3:21 PM  Patient Name: Emily [name]  Attending Physician: Marcelino Freestone, MD    Date of Admission:   04/17/2014    Date of Discharge:   04/23/2014    Reason for Admission:   Altered mental status, unspecified altered mental status type [780.97]    Problems:   S/p Right breast abscess with drainage tube (was done at [facility])  HTN  Hypothyroidism  HLD  GERD  Chronic anemia        Discharge Dx:   S/p AMS/??memory loss (Ct and MRI negative)  S/p Thiamine deficiency (S/p IV thiamine and continue po. Follow up pmd/neurology office)  S/p UTI  S/p Right breast abscess with drainage tube (continue cipro and omnicef. Discussed with Plastic surgery Dr Azucena Kuba from [facility]; agree to d.c withj po abx and follow up office in 1 week)  HTN  Hypothyroidism  HLD  GERD  Chronic anemia      Consultations:   Treatment Team: Attending Provider: Marcelino Freestone, MD; Registered Nurse: Cheryll Cockayne, RN; Registered Nurse: Donneta Romberg, RN; Technician: Michela Pitcher; Registered Nurse: Georgeanna Harrison, RN; Registered Nurse: Thayer Jew, RN; Case Manager: Nicky Pugh, RN; Registered Nurse: Othella Boyer, RN; Registered Nurse: Sloan Leiter, RN; Registered Nurse: Tyrone Nine, RN       Hospital Course:   This is 68 y.o. female who presents to the hospital with AMS and ? memory loss.Pt was brought here by daughter and son because of memory loss since surgery to remove right breast expanders at [facility] 4 days ago with drainage tube. No fever. Patient has not been taking any medications except for asa. Patient could not remember why she had the surgery, but no other focal deficits. Pt does not recall when and why she had breast surgery at [facility].Denied chill. No sob, no cp, no weakness and numbness in all extremities at that time. From ed pt was admitted to [facility] for further management. Neurology saw the pt and also follow up done too. All work up  was not significant and but found to be have thiamine deficiency and was supplemented iv and po. Also iv abx for rt breast abscess and no fever, no wbc, blood culture was also negative too. After discussed with Dr sacks from [facility] [employer] [facility] not to worry about ct report from [place] 2x9 cm and ok to d.c witj po abx and follow up as out pt. Today pt is doing well and as ok from neurology will be d/c home with po abx and thiamine. Pt daughter agree to follow up at Dr sacks office in 1 week.    Discharge Medications:     Current Discharge Medication List      START taking these medications    Details   cefdinir (OMNICEF) 300 MG capsule Take 1 capsule (300 mg total) by mouth every 12 (twelve) hours.  Qty: 20 capsule, Refills: 0      ciprofloxacin (CIPRO) 500 MG tablet Take 1 tablet (500 mg total) by mouth every 12 (twelve) hours.  Qty: 20 tablet, Refills: 0      thiamine (B-1) 100 MG tablet Take 1 tablet (100 mg total) by mouth daily.  Qty: 30 tablet, Refills: 0         CONTINUE these medications which have NOT CHANGED    Details   lisinopril (PRINIVIL,ZESTRIL) 10 MG tablet Take 10 mg by mouth daily.  omeprazole (PRILOSEC) 10 MG capsule Take 20 mg by mouth daily.      aspirin 81 MG EC tablet Take 81 mg by mouth daily.      oxyCODONE (OXY-IR) 5 MG capsule Take 5 mg by mouth every 6 (six) hours as needed.               Discharge Instructions:   Diet: Cardiac    Activity: as tolerate     Follow-up with internal medicine in 1 week. Plastic surgery Dr Azucena Kuba office 1 week.      Signed by: Marcelino Freestone, MD

## 2014-04-23 NOTE — Plan of Care (Signed)
Problem: Pain  Goal: Patient's pain/discomfort is manageable  Outcome: Adequate for Discharge  No pain at this time

## 2014-04-23 NOTE — Plan of Care (Signed)
Problem: Safety  Goal: Patient will be free from injury during hospitalization  Outcome: Progressing  Bed in the lowest position, call bell within reach, hourly rounding in place    Comments:   Pt alert and oriented X4, denies pain, denies cardiac and respiratory distress, JP draining serosanguinous drainage, aware of safety protocol, fall precautions are in place, will continue to monitor pt

## 2014-04-23 NOTE — Discharge Instructions (Addendum)
PMD 1 week  Plastic surgery Dr sacks office 0n 04/28/14

## 2014-04-24 ENCOUNTER — Other Ambulatory Visit: Payer: Self-pay | Admitting: Family Medicine

## 2014-11-27 ENCOUNTER — Ambulatory Visit (INDEPENDENT_AMBULATORY_CARE_PROVIDER_SITE_OTHER): Payer: Medicare Other | Admitting: Cardiology

## 2014-11-27 ENCOUNTER — Encounter (INDEPENDENT_AMBULATORY_CARE_PROVIDER_SITE_OTHER): Payer: Self-pay | Admitting: Cardiology

## 2014-11-27 VITALS — BP 145/73 | HR 65 | Resp 16 | Ht 60.0 in | Wt 166.0 lb

## 2014-11-27 DIAGNOSIS — R9439 Abnormal result of other cardiovascular function study: Secondary | ICD-10-CM

## 2014-11-27 DIAGNOSIS — R072 Precordial pain: Secondary | ICD-10-CM

## 2014-11-27 DIAGNOSIS — R079 Chest pain, unspecified: Secondary | ICD-10-CM

## 2014-11-27 NOTE — Procedures (Signed)
EXERCISE STRESS TEST REPORT    [facility] - [facility] [place]  Tel [phone]      PATIENT:  Emily [name]         MRN: [id number].  Gender: female.  DOB: 1946/03/17.  Age: 70 y.o.    Date of study:  11/27/2014    Ordering Physician:  Denyse Dago, MD  Primary Physician:  Ramon Dredge, MD  Primary Cardiologist:  Denyse Dago, MD    INDICATION:    1. Chest pain, unspecified chest pain type         DATA:   Resting ECG:  Normal sinus rhythm.      Protocol:  Bruce   Exercise time:  01 minutes 50 seconds.   Vitals at rest:  64 bpm, 144/68 mmHg  Vitals at peak exercise:  127 bpm, 184/74 mmHg    % of age predicted maximum heart rate:  84%   METS achieved: 4.6  Symptoms: fatigue    Reason for stopping exercise:  fatigue  Achieved target heart rate:  No   ST changes at peak exercise:  1-1.5 mm ST depression in the inferior and lateral leads at peak exercise  Arrhythmias:  None  Recovery:  Normal    IMPRESSION:   Borderline ST depression at peak exercise of borderline significance for ischemia.       Interpreted and electronically signed by:  Denyse Dago MD   [facility], [facility] [place] - [place]to Sorenson - Faythe D[place]wall

## 2014-11-27 NOTE — Progress Notes (Signed)
[place] [facility] EVALUATION    I had the pleasure of seeing Emily [name] today for cardiovascular follow up. He is a pleasant 69 y.o. female with a history of a normal stress nuclear scan Jan 2013 for in hospital evaluation for chest pain and now palpitations for one second at times when she does physical exertion over the past 1 month and no chest pain with exertion who presents for evaluation. The patient has some chest pain if she mops at times and 2 mos ago patient had one episode of sharp left chest pain for a few seconds.    Never cigarettes.   The pain is gone in the chest since the patient started wearing a brazzier.     MEDICATIONS:    Current outpatient prescriptions:   .  aspirin 81 MG EC tablet, Take 81 mg by mouth daily., Disp: , Rfl:   .  lisinopril (PRINIVIL,ZESTRIL) 10 MG tablet, Take 10 mg by mouth daily., Disp: , Rfl:   .  omeprazole (PRILOSEC) 10 MG capsule, Take 20 mg by mouth daily., Disp: , Rfl:   .  oxyCODONE (OXY-IR) 5 MG capsule, Take 5 mg by mouth every 6 (six) hours as needed., Disp: , Rfl:        REVIEW OF SYSTEMS: All other systems reviewed and negative except as above.    PHYSICAL EXAMINATION  General Appearance: well-appearing and in no acute distress.   Vital Signs: BP 145/73 mmHg  Pulse 65  Resp 16  Ht 1.524 m (5')  Wt 75.297 kg (166 lb)  BMI 32.42 kg/m2     Neck: Supple without jugular venous distention.  Normal carotid upstrokes without bruits.  Chest: Clear to auscultation bilaterally with good air movement and respiratory effort and no wheezes, rales, or rhonchi  Cardiovascular: Normal S1 and  S2 with 2/6 sem lsb and no gallops or rub. PMI of normal size and nondisplaced.   Abdomen: Soft, nontender. No organomegaly.  No pulsatile masses or bruits.    Extremities: Warm without edema. All peripheral pulses are full and equal.    Neuro: Alert and oriented x3. Grossly intact. Strength is symmetrical. Normal mood and affect.     ECG: nsr nssttwc.    Past Medical History    Diagnosis Date   . Hypertension    . Breast cancer in female, unspecified laterality      Family History   Problem Relation Age of Onset   . Breast cancer Neg Hx    neg family hx of cad  History     Social History   . Marital Status: Widowed     Spouse Name: N/A     Number of Children: N/A   . Years of Education: N/A     Social History Main Topics   . Smoking status: Never Smoker    . Smokeless tobacco: Never Used   . Alcohol Use: No   . Drug Use: None   . Sexual Activity: None     Other Topics Concern   . None     Social History Narrative         IMPRESSION:  Atypical chest pain with borderline ST depression on today's stress test  Hypertension  Hyperlipidemia      PLAN:   Stress echo  OV day of the test if possible.      Royann Shivers, MD   11/27/2014

## 2014-12-17 ENCOUNTER — Ambulatory Visit (INDEPENDENT_AMBULATORY_CARE_PROVIDER_SITE_OTHER): Payer: Medicare Other | Admitting: Cardiology

## 2014-12-17 ENCOUNTER — Encounter (INDEPENDENT_AMBULATORY_CARE_PROVIDER_SITE_OTHER): Payer: Self-pay | Admitting: Cardiology

## 2014-12-17 VITALS — BP 132/79 | HR 66 | Resp 16 | Ht 63.0 in | Wt 168.0 lb

## 2014-12-17 DIAGNOSIS — I10 Essential (primary) hypertension: Secondary | ICD-10-CM

## 2014-12-17 DIAGNOSIS — R079 Chest pain, unspecified: Secondary | ICD-10-CM

## 2014-12-17 DIAGNOSIS — R1013 Epigastric pain: Secondary | ICD-10-CM

## 2014-12-17 DIAGNOSIS — R9439 Abnormal result of other cardiovascular function study: Secondary | ICD-10-CM

## 2014-12-17 DIAGNOSIS — K30 Functional dyspepsia: Secondary | ICD-10-CM

## 2014-12-17 MED ORDER — OMEPRAZOLE 10 MG PO CPDR
20.0000 mg | DELAYED_RELEASE_CAPSULE | Freq: Every day | ORAL | Status: DC
Start: 2014-12-17 — End: 2018-05-14

## 2014-12-17 MED ORDER — LISINOPRIL 10 MG PO TABS
10.0000 mg | ORAL_TABLET | Freq: Every day | ORAL | Status: DC
Start: 2014-12-17 — End: 2016-01-14

## 2014-12-17 NOTE — Progress Notes (Signed)
[place] [facility] PROGRESS NOTE    I had the pleasure of seeing Emily [name] today for cardiovascular follow up. She is a pleasant 69 y.o. female with a history of atypical chest pain and palpitations that have resolved who presents after a normal stress echo today.          MEDICATIONS:    Current outpatient prescriptions:   .  aspirin 81 MG EC tablet, Take 81 mg by mouth daily., Disp: , Rfl:   .  lisinopril (PRINIVIL,ZESTRIL) 10 MG tablet, Take 10 mg by mouth daily., Disp: , Rfl:   .  omeprazole (PRILOSEC) 10 MG capsule, Take 20 mg by mouth daily., Disp: , Rfl:   .  oxyCODONE (OXY-IR) 5 MG capsule, Take 5 mg by mouth every 6 (six) hours as needed., Disp: , Rfl:        REVIEW OF SYSTEMS: All other systems reviewed and negative except as above.    PHYSICAL EXAMINATION  General Appearance: well-appearing and in no acute distress.   Vital Signs: BP 132/79 mmHg  Pulse 66  Resp 16  Ht 1.6 m (5\' 3" )  Wt 76.204 kg (168 lb)  BMI 29.77 kg/m2     Past Medical History   Diagnosis Date   . Hypertension    . Breast cancer in female, unspecified laterality    . Hyperlipidemia      Family History   Problem Relation Age of Onset   . Breast cancer Neg Hx      History     Social History   . Marital Status: Widowed     Spouse Name: N/A     Number of Children: N/A   . Years of Education: N/A     Social History Main Topics   . Smoking status: Never Smoker    . Smokeless tobacco: Never Used   . Alcohol Use: No   . Drug Use: None   . Sexual Activity: None     Other Topics Concern   . None     Social History Narrative         IMPRESSION:  Normal stress echo, no evidence for significant cad  False positive ST segments noted during the stress echo with normal echo images  Hypertension controlled  Hyperlipidemia  Diabetes      PLAN:   No change in meds  1 year follow up.      Royann Shivers, MD   12/17/2014

## 2014-12-17 NOTE — Procedures (Signed)
STRESS ECHOCARDIOGRAPHY REPORT    [facility] - [facility]  Tel [phone]      PATIENT:  Emily [name]         MRN: [id number].  Gender: female.  DOB: 12/23/1945.  Age: 69 y.o.    Date of study: 12/17/2014    Ordering Physician:  Denyse Dago, MD  Primary Physician:  Ramon Dredge, MD  Primary Cardiologist:  Denyse Dago, MD    INDICATION:    1. Chest pain, unspecified chest pain type    2. Abnormal stress test         DATA:   Resting ECG:  Normal sinus rhythm.  Non specific STT change    Protocol:  Bruce   Exercise time:  03 minutes 47 seconds.   Vitals at rest:  65 bpm, 130/85 mmHg  Vitals at peak exercise:  135 bpm, 158/84 mmHg    % of age predicted maximum heart rate:  89%   METS achieved: 4.6  Symptoms: Fatigue    Reason for stopping exercise:  fatigue  Achieved target heart rate:  Yes   ST changes at peak exercise:  1.5 mm inferolateral st depression  Arrhythmias:  none  Recovery:  normal    Resting ECHO:  EF 65 %.  Wall motion:  Normal  Peak exercise ECHO:  EF 75 %.  Wall motion:  Normal    IMPRESSION:   No evidence for exercise induced ischemia by ekg or symptoms with and adequate study achieved.   Normal wall motion at rest and with exercise with an appropriate increase in contractility with exercise.   Normal stress echo.        Interpreted and electronically signed by:  Dr. Lieutenant Diego [facility], [facility] [place] - [place]to Sorenson - Faythe D[place]

## 2014-12-18 ENCOUNTER — Encounter (INDEPENDENT_AMBULATORY_CARE_PROVIDER_SITE_OTHER): Payer: Self-pay | Admitting: Cardiology

## 2015-01-15 ENCOUNTER — Encounter (INDEPENDENT_AMBULATORY_CARE_PROVIDER_SITE_OTHER): Payer: Self-pay | Admitting: Cardiology

## 2015-01-29 ENCOUNTER — Other Ambulatory Visit: Payer: Self-pay | Admitting: Family Medicine

## 2015-01-29 ENCOUNTER — Ambulatory Visit
Admission: RE | Admit: 2015-01-29 | Discharge: 2015-01-29 | Disposition: A | Discharge status: Home / Self Care | Payer: Medicare Other | Source: Ambulatory Visit | Attending: Family Medicine | Admitting: Family Medicine

## 2015-01-29 DIAGNOSIS — N644 Mastodynia: Secondary | ICD-10-CM | POA: Insufficient documentation

## 2015-02-25 ENCOUNTER — Institutional Professional Consult (permissible substitution) (INDEPENDENT_AMBULATORY_CARE_PROVIDER_SITE_OTHER): Payer: Medicare Other | Admitting: Hematology & Oncology

## 2015-04-16 ENCOUNTER — Other Ambulatory Visit: Payer: Self-pay | Admitting: Hematology & Oncology

## 2015-04-16 DIAGNOSIS — C50911 Malignant neoplasm of unspecified site of right female breast: Secondary | ICD-10-CM

## 2015-04-21 ENCOUNTER — Ambulatory Visit: Payer: Medicare Other

## 2015-06-18 ENCOUNTER — Other Ambulatory Visit: Payer: Self-pay | Admitting: Hematology & Oncology

## 2015-06-18 DIAGNOSIS — C50911 Malignant neoplasm of unspecified site of right female breast: Secondary | ICD-10-CM

## 2015-06-18 DIAGNOSIS — Z13820 Encounter for screening for osteoporosis: Secondary | ICD-10-CM

## 2015-06-22 ENCOUNTER — Other Ambulatory Visit: Payer: Self-pay | Admitting: Hematology & Oncology

## 2015-06-22 ENCOUNTER — Ambulatory Visit
Admission: RE | Admit: 2015-06-22 | Discharge: 2015-06-22 | Disposition: A | Discharge status: Home / Self Care | Payer: Medicare Other | Source: Ambulatory Visit | Attending: Hematology & Oncology | Admitting: Hematology & Oncology

## 2015-06-22 DIAGNOSIS — M8589 Other specified disorders of bone density and structure, multiple sites: Secondary | ICD-10-CM | POA: Insufficient documentation

## 2015-06-22 DIAGNOSIS — Z13820 Encounter for screening for osteoporosis: Secondary | ICD-10-CM

## 2015-06-22 DIAGNOSIS — Z9889 Other specified postprocedural states: Secondary | ICD-10-CM | POA: Insufficient documentation

## 2015-06-22 DIAGNOSIS — Z1231 Encounter for screening mammogram for malignant neoplasm of breast: Secondary | ICD-10-CM | POA: Insufficient documentation

## 2015-06-22 DIAGNOSIS — Z853 Personal history of malignant neoplasm of breast: Secondary | ICD-10-CM | POA: Insufficient documentation

## 2015-06-22 DIAGNOSIS — C50911 Malignant neoplasm of unspecified site of right female breast: Secondary | ICD-10-CM

## 2015-07-03 ENCOUNTER — Emergency Department: Payer: Medicare Other

## 2015-07-03 ENCOUNTER — Other Ambulatory Visit (INDEPENDENT_AMBULATORY_CARE_PROVIDER_SITE_OTHER): Payer: Self-pay | Admitting: Cardiology

## 2015-07-03 ENCOUNTER — Emergency Department
Admission: EM | Admit: 2015-07-03 | Discharge: 2015-07-03 | Disposition: A | Discharge status: Home / Self Care | Payer: Medicare Other | Attending: Emergency Medicine | Admitting: Emergency Medicine

## 2015-07-03 DIAGNOSIS — I3139 Other pericardial effusion (noninflammatory): Secondary | ICD-10-CM

## 2015-07-03 DIAGNOSIS — R0789 Other chest pain: Secondary | ICD-10-CM | POA: Insufficient documentation

## 2015-07-03 DIAGNOSIS — I313 Pericardial effusion (noninflammatory): Secondary | ICD-10-CM

## 2015-07-03 DIAGNOSIS — Z7982 Long term (current) use of aspirin: Secondary | ICD-10-CM | POA: Insufficient documentation

## 2015-07-03 DIAGNOSIS — I10 Essential (primary) hypertension: Secondary | ICD-10-CM | POA: Insufficient documentation

## 2015-07-03 DIAGNOSIS — Z853 Personal history of malignant neoplasm of breast: Secondary | ICD-10-CM | POA: Insufficient documentation

## 2015-07-03 DIAGNOSIS — E785 Hyperlipidemia, unspecified: Secondary | ICD-10-CM | POA: Insufficient documentation

## 2015-07-03 LAB — COMPREHENSIVE METABOLIC PANEL
ALT: 12 U/L (ref 0–55)
AST (SGOT): 20 U/L (ref 5–34)
Albumin/Globulin Ratio: 1.4 (ref 0.9–2.2)
Albumin: 4.2 g/dL (ref 3.5–5.0)
Alkaline Phosphatase: 105 U/L (ref 37–106)
Anion Gap: 11 (ref 5.0–15.0)
BUN: 14 mg/dL (ref 7–19)
Bilirubin, Total: 0.4 mg/dL (ref 0.2–1.2)
CO2: 26 mEq/L (ref 22–29)
Calcium: 9.9 mg/dL (ref 7.9–10.2)
Chloride: 104 mEq/L (ref 100–111)
Creatinine: 0.8 mg/dL (ref 0.6–1.0)
Globulin: 3 g/dL (ref 2.0–3.6)
Glucose: 103 mg/dL — ABNORMAL HIGH (ref 70–100)
Potassium: 4.8 mEq/L (ref 3.5–5.1)
Protein, Total: 7.2 g/dL (ref 6.0–8.3)
Sodium: 141 mEq/L (ref 136–145)

## 2015-07-03 LAB — CBC AND DIFFERENTIAL
Basophils Absolute Automated: 0.02 10*3/uL (ref 0.00–0.20)
Basophils Automated: 0 %
Eosinophils Absolute Automated: 0.13 10*3/uL (ref 0.00–0.70)
Eosinophils Automated: 2 %
Hematocrit: 41.2 % (ref 37.0–47.0)
Hgb: 12.8 g/dL (ref 12.0–16.0)
Immature Granulocytes Absolute: 0.02 10*3/uL
Immature Granulocytes: 0 %
Lymphocytes Absolute Automated: 2.45 10*3/uL (ref 0.50–4.40)
Lymphocytes Automated: 32 %
MCH: 26.8 pg — ABNORMAL LOW (ref 28.0–32.0)
MCHC: 31.1 g/dL — ABNORMAL LOW (ref 32.0–36.0)
MCV: 86.2 fL (ref 80.0–100.0)
MPV: 11.2 fL (ref 9.4–12.3)
Monocytes Absolute Automated: 0.53 10*3/uL (ref 0.00–1.20)
Monocytes: 7 %
Neutrophils Absolute: 4.5 10*3/uL (ref 1.80–8.10)
Neutrophils: 59 %
Nucleated RBC: 0 /100 WBC (ref 0–1)
Platelets: 351 10*3/uL (ref 140–400)
RBC: 4.78 10*6/uL (ref 4.20–5.40)
RDW: 14 % (ref 12–15)
WBC: 7.63 10*3/uL (ref 3.50–10.80)

## 2015-07-03 LAB — GFR: EGFR: >60

## 2015-07-03 LAB — TROPONIN I: Troponin I: <0.01 ng/mL (ref 0.00–0.09)

## 2015-07-03 LAB — PT AND APTT
PT INR: 1 (ref 0.9–1.1)
PT: 13.1 s (ref 12.6–15.0)
PTT: 32 s (ref 23–37)

## 2015-07-03 MED ORDER — IOHEXOL 350 MG/ML IV SOLN
INTRAVENOUS | Status: AC
Start: 2015-07-03 — End: 2015-07-03
  Administered 2015-07-03: 100 mL via INTRAVENOUS
  Filled 2015-07-03: qty 100

## 2015-07-03 MED ORDER — IBUPROFEN 600 MG PO TABS
600.0000 mg | ORAL_TABLET | Freq: Four times a day (QID) | ORAL | Status: AC | PRN
Start: 2015-07-03 — End: 2015-07-10

## 2015-07-03 MED ORDER — ASPIRIN 81 MG PO CHEW
162.0000 mg | CHEWABLE_TABLET | Freq: Every day | ORAL | Status: DC
Start: 2015-07-03 — End: 2015-07-03
  Administered 2015-07-03: 162 mg via ORAL
  Filled 2015-07-03: qty 2

## 2015-07-03 MED ORDER — ASPIRIN 325 MG PO TABS
325.0000 mg | ORAL_TABLET | Freq: Once | ORAL | Status: DC
Start: 2015-07-03 — End: 2015-07-03
  Filled 2015-07-03: qty 1

## 2015-07-03 NOTE — Consults (Signed)
CONSULTATION    Date Time: 07/03/2015 3:58 PM  Patient Name: Emily [name]  Requesting Physician: Daylene Posey, MD      Reason for Consultation:   Chest pain    Assessment:   1. Atypical chest discomfort  2. Normal stress echo for atypical chest discomfort March 2016.   3. Hypertension  4. Abnormal CT chest with no PE , small pericardial effusion, patchy groundglass infiltrates lungs.  5. No diagnosis of pericarditis as the pain is not consistent with this, no rub, no fever, no ekg change of significance.     Plan:   OK to discharge from the ER from the cardiac view.  Echo and cardiology follow up next week.     EKG: nsr, t wave flattening otherwise normal I have personally reviewed and interpreted the EKG/Rhythm.    History:   Emily [name] is a 69 y.o. female who presents to the hospital on 07/03/2015 with a 2 day history of intermittent 1-2 second chest pain episodes can occur at any time, not with exertion necessarily. The patient had a negative stress echo March 2016. A friend of the patient recently had dx of heart problems increasing anxiety.      Past Medical History:     Past Medical History   Diagnosis Date   . Hypertension    . Hyperlipidemia    . Breast cancer 2014     right breast mastectomy       Past Surgical History:     Past Surgical History   Procedure Laterality Date   . Mastectomy Right 2015       Family History:     Family History   Problem Relation Age of Onset   . Breast cancer Neg Hx        Social History:     Social History     Social History   . Marital Status: Widowed     Spouse Name: N/A   . Number of Children: N/A   . Years of Education: N/A     Social History Main Topics   . Smoking status: Never Smoker    . Smokeless tobacco: Never Used   . Alcohol Use: No   . Drug Use: Not on file   . Sexual Activity: Not on file     Other Topics Concern   . Not on file     Social History Narrative       Allergies:     Allergies   Allergen Reactions   . Morphine        Medications:     Current  Facility-Administered Medications   Medication Dose Route Frequency   . aspirin  162 mg Oral Daily       Review of Systems:   A comprehensive review of systems was: History obtained from the patient  General ROS: negative  Psychological ROS: positive for - anxiety  Respiratory ROS: no cough, shortness of breath, or wheezing  Cardiovascular ROS: positive for - chest pain  Gastrointestinal ROS: no abdominal pain, change in bowel habits, or black or bloody stools  Genito-Urinary ROS: no dysuria, trouble voiding, or hematuria  Musculoskeletal ROS: negative  Neurological ROS: no TIA or stroke symptoms  Dermatological ROS: negative    Physical Exam:     Filed Vitals:    07/03/15 1247   BP: 168/60   Pulse: 67   Temp: 97.1 F (36.2 C)   Resp: 16   SpO2: 97%  Intake and Output Summary (Last 24 hours) at Date Time  No intake or output data in the 24 hours ending 07/03/15 1558    General appearance - alert, well appearing, and in no distress  Mental status - alert, oriented to person, place, and time, anxious  Mouth - mucous membranes moist, pharynx normal without lesions  Chest - few crackles  Heart - normal rate, regular rhythm, normal S1, S2, no murmurs, rubs, clicks or gallops  Abdomen - soft, nontender, nondistended, no masses or organomegaly  Neurological - alert, oriented, normal speech, no focal findings or movement disorder noted  Musculoskeletal - no joint tenderness, deformity or swelling  Extremities - no pedal edema noted  Skin - normal coloration and turgor, no rashes, no suspicious skin lesions noted    Labs Reviewed:     Results     Procedure Component Value Units Date/Time    PT/APTT [[id number]] Collected:  07/03/15 1532     PT 13.1 sec Updated:  07/03/15 1547     PT INR 1.0      PT Anticoag. Given Within 48 hrs. Unknown      PTT 32 sec     Comprehensive Metabolic Panel (CMP) [[id number]]  (Abnormal) Collected:  07/03/15 1352    Specimen Information:  Blood Updated:  07/03/15 1425     Glucose 103 (H)  mg/dL      BUN 14 mg/dL      Creatinine 0.8 mg/dL      Sodium 308 mEq/L      Potassium 4.8 mEq/L      Chloride 104 mEq/L      CO2 26 mEq/L      Calcium 9.9 mg/dL      Protein, Total 7.2 g/dL      Albumin 4.2 g/dL      AST (SGOT) 20 U/L      ALT 12 U/L      Alkaline Phosphatase 105 U/L      Bilirubin, Total 0.4 mg/dL      Globulin 3.0 g/dL      Albumin/Globulin Ratio 1.4      Anion Gap 11.0     Troponin I [[id number]] Collected:  07/03/15 1352    Specimen Information:  Blood Updated:  07/03/15 1425     Troponin I <0.01 ng/mL     GFR [[id number]] Collected:  07/03/15 1352     EGFR >60.0 Updated:  07/03/15 1425    CBC with differential [[id number]]  (Abnormal) Collected:  07/03/15 1352    Specimen Information:  Blood from Blood Updated:  07/03/15 1404     WBC 7.63 x10 3/uL      Hgb 12.8 g/dL      Hematocrit 66.4 %      Platelets 351 x10 3/uL      RBC 4.78 x10 6/uL      MCV 86.2 fL      MCH 26.8 (L) pg      MCHC 31.1 (L) g/dL      RDW 14 %      MPV 11.2 fL      Neutrophils 59 %      Lymphocytes Automated 32 %      Monocytes 7 %      Eosinophils Automated 2 %      Basophils Automated 0 %      Immature Granulocyte 0 %      Nucleated RBC 0 /100 WBC      Neutrophils Absolute 4.50 x10 3/uL  Abs Lymph Automated 2.45 x10 3/uL      Abs Mono Automated 0.53 x10 3/uL      Abs Eos Automated 0.13 x10 3/uL      Absolute Baso Automated 0.02 x10 3/uL      Absolute Immature Granulocyte 0.02 x10 3/uL           Recent CBC   Recent Labs      07/03/15   1352   RBC  4.78   HGB  12.8   HEMATOCRIT  41.2   MCV  86.2   MCH  26.8*   MCHC  31.1*   RDW  14   MPV  11.2       Rads:   Radiological Procedure reviewed.     Signed by: Royann Shivers

## 2015-07-03 NOTE — ED Notes (Signed)
Pt is alert oriented x 4 and pt is stable to go home and vital signs are within normal limit.i gave all discharge instructions and prescriptions as ordered and pt understood all instructions  . Pt i/v is d/ced before discharge.

## 2015-07-03 NOTE — Discharge Instructions (Signed)
Chest Pain of Unclear Etiology     You have been seen for chest pain. The cause of your pain is not yet known.     Your doctor has learned about your medical history, examined you, and checked any tests that were done. Still, it is unclear why you are having pain. The doctor thinks there is only a very small chance that your pain is caused by a life-threatening condition. Later, your primary care doctor might do more tests or check you again.     Sometimes chest pain is caused by a dangerous condition, like a heart attack, aorta injury, blood clot in the lung, or collapsed lung. It is unlikely that your pain is caused by a life-threatening condition if: Your chest pain lasts only a few seconds at a time; you are not short of breath, nauseated (sick to your stomach), sweaty, or lightheaded; your pain gets worse when you twist or bend; your pain improves with exercise or hard work.     Chest pain is serious. It is VERY IMPORTANT that you follow up with your regular doctor and seek medical attention immediately here or at the nearest Emergency Department if your symptoms become worse or they change.     YOU SHOULD SEEK MEDICAL ATTENTION IMMEDIATELY, EITHER HERE OR AT THE NEAREST EMERGENCY DEPARTMENT, IF ANY OF THE FOLLOWING OCCURS:  · Your pain gets worse.  · Your pain makes you short of breath, nauseated, or sweaty.  · Your pain gets worse when you walk, go up stairs, or exert yourself.  · You feel weak, lightheaded, or faint.  · It hurts to breathe.  · Your leg swells.  · Your symptoms get worse or you have new symptoms or concerns.

## 2015-07-03 NOTE — ED Notes (Signed)
Pt reports to the ED with c/o chest pain since yesterday that radiates down her left arm.

## 2015-07-04 NOTE — ED Provider Notes (Signed)
Physician/Midlevel provider first contact with patient: 07/03/15 1319         History     Chief Complaint   Patient presents with   . Chest Pain     HPI   69 yo female with hx of htn, and breast ca in remission s/p rt sided mastectomy.    Presents with 2 weeks of episodic lft sided chest pain, occuring at random in several second intervals several times daily, with sharp pain moderate.  With onset of sharp lft arm pain for several seconds today. With no worsening with exertion or breath. No sob, no n/v.  No current chest pain. No other radiation of pain.   Pt denies hx of dm / smoking / drug use (including cocaine, amphetamines and ivda) / cad / fhx of cad / hx of MI / Hx of dvt or pe / travel / recent surgery   Pt denies abdominal pain, nausea, vomiting, diarrhea and other extremity pain.  Pt denies changes in vision, hearing, sensation, strength or coordination.  No head or neck pain. No trauma    Past Medical History   Diagnosis Date   . Hypertension    . Hyperlipidemia    . Breast cancer 2014     right breast mastectomy       Past Surgical History   Procedure Laterality Date   . Mastectomy Right 2015       Family History   Problem Relation Age of Onset   . Breast cancer Neg Hx        Social  Social History   Substance Use Topics   . Smoking status: Never Smoker    . Smokeless tobacco: Never Used   . Alcohol Use: No       .     Allergies   Allergen Reactions   . Morphine        Home Medications     Last Medication Reconciliation Action:  In Progress Adele Dan, RN 07/03/2015 12:50 PM                  aspirin 81 MG EC tablet     Take 81 mg by mouth daily.     lisinopril (PRINIVIL,ZESTRIL) 10 MG tablet     Take 1 tablet (10 mg total) by mouth daily.     omeprazole (PRILOSEC) 10 MG capsule     Take 2 capsules (20 mg total) by mouth daily.     oxyCODONE (OXY-IR) 5 MG capsule     Take 5 mg by mouth every 6 (six) hours as needed.           Review of Systems   Constitutional: Negative for fever and chills.    HENT: Negative for sore throat.    Eyes: Negative for visual disturbance.   Respiratory: Negative for cough and shortness of breath.    Cardiovascular: Positive for chest pain. Negative for leg swelling.   Gastrointestinal: Negative for nausea, vomiting, abdominal pain and diarrhea.   Endocrine: Negative for polyuria.   Genitourinary: Negative for dysuria, frequency, hematuria and flank pain.   Musculoskeletal: Negative for back pain, arthralgias, gait problem, neck pain and neck stiffness.   Skin: Negative for wound.   Allergic/Immunologic: Negative for immunocompromised state.   Neurological: Negative for weakness, numbness and headaches.   Hematological: Does not bruise/bleed easily.   Psychiatric/Behavioral: Negative for agitation.       Physical Exam    BP: 168/60 mmHg, Heart Rate: 67, Temp:  97.1 F (36.2 C), Resp Rate: 16, SpO2: 97 %, Weight: 77.111 kg    Physical Exam   Constitutional: She is oriented to person, place, and time. She appears well-developed and well-nourished. No distress.   HENT:   Head: Normocephalic and atraumatic.   Eyes: EOM are normal.   Neck: Neck supple.   Cardiovascular: Normal rate, regular rhythm and normal heart sounds.  Exam reveals no gallop and no friction rub.    No murmur heard.  Pulmonary/Chest: Effort normal and breath sounds normal. No respiratory distress. She has no wheezes. She has no rales. She exhibits no tenderness.   Abdominal: Soft. Bowel sounds are normal. She exhibits no distension. There is no tenderness. There is no rebound and no guarding.   Musculoskeletal: Normal range of motion. She exhibits no edema or tenderness.   Rad 2 + b/l   Neurological: She is alert and oriented to person, place, and time. No cranial nerve deficit. Coordination normal.   nml strength and sensation throughout.   Skin: Skin is warm and dry. No rash noted. No erythema.   No breast mass or tenderness.    Psychiatric: She has a normal mood and affect.         MDM and ED Course     ED  Medication Orders     Start Ordered     Status Ordering Provider    07/03/15 1427 07/03/15 1427  iohexol (OMNIPAQUE) 350 MG/ML injection     Comments:  Created by cabinet override    Last MAR action:  Given     07/03/15 1341 07/03/15 1340     Daily,   Status:  Discontinued     Route: Oral  Ordered Dose: 162 mg     Discontinued EMBREY, EVERETT C    07/03/15 1324 07/03/15 1323     Once,   Status:  Discontinued     Route: Oral  Ordered Dose: 325 mg     Discontinued Birda Didonato S             MDM  Number of Diagnoses or Management Options  Other chest pain:             Procedures  ekg - nsr with no acute st abnormalities, nml intervals and axis. Interpretation by myself.     Clinical Impression & Disposition     Clinical Impression  Final diagnoses:   Other chest pain    69 yo female with sharp chest pain occuring in several second intervals for 2 weeks. Chest pain is not current and is atypical.  Will do ekg and troponin to assess for ischemia. Will do cbc to assess for anemia and infection. Will do bmp to assess for electrolyte abnormalities and kidney function. Will do coags to assess for bleeding risk.  Will do ct of chest and venous dopplers b/l to assess for PE due to hx of cancer.    Will give aspirin    ED Disposition     Discharge Corrie Dandy B Mullany discharge to home/self care.    Condition at disposition: Stable         lab work nml. ekg nml. Ct of chest shows a small pericardial effusion. Spoke with dr. Franchot Erichsen - pt's cardiologist and will [place] with f/u for echo in several days. [place] with ibu   Discharge Medication List as of 07/03/2015  3:48 PM      START taking these medications    Details   ibuprofen (ADVIL,MOTRIN) 600 MG  tablet Take 1 tablet (600 mg total) by mouth every 6 (six) hours as needed for Pain or Fever., Starting 07/03/2015, Until Fri 07/10/15, Print                         Daylene Posey, MD  07/04/15 [phone]

## 2015-07-07 LAB — ECG 12-LEAD
Atrial Rate: 64 {beats}/min
P Axis: 48 degrees
P-R Interval: 166 ms
Q-T Interval: 402 ms
QRS Duration: 90 ms
QTC Calculation (Bezet): 414 ms
R Axis: 46 degrees
T Axis: 31 degrees
Ventricular Rate: 64 {beats}/min

## 2015-07-10 ENCOUNTER — Ambulatory Visit (INDEPENDENT_AMBULATORY_CARE_PROVIDER_SITE_OTHER): Payer: Medicare Other

## 2015-07-10 ENCOUNTER — Other Ambulatory Visit (INDEPENDENT_AMBULATORY_CARE_PROVIDER_SITE_OTHER): Payer: Medicare Other

## 2015-07-10 DIAGNOSIS — I313 Pericardial effusion (noninflammatory): Secondary | ICD-10-CM

## 2015-07-10 DIAGNOSIS — I3139 Other pericardial effusion (noninflammatory): Secondary | ICD-10-CM

## 2015-07-22 ENCOUNTER — Ambulatory Visit (INDEPENDENT_AMBULATORY_CARE_PROVIDER_SITE_OTHER): Payer: Medicare Other | Admitting: Cardiology

## 2015-08-11 ENCOUNTER — Encounter (INDEPENDENT_AMBULATORY_CARE_PROVIDER_SITE_OTHER): Payer: Self-pay | Admitting: Cardiology

## 2015-08-11 ENCOUNTER — Ambulatory Visit (INDEPENDENT_AMBULATORY_CARE_PROVIDER_SITE_OTHER): Payer: Medicare Other | Admitting: Cardiology

## 2015-08-11 VITALS — BP 128/77 | HR 80 | Ht 63.0 in | Wt 166.0 lb

## 2015-08-11 DIAGNOSIS — I10 Essential (primary) hypertension: Secondary | ICD-10-CM

## 2015-08-11 DIAGNOSIS — R002 Palpitations: Secondary | ICD-10-CM

## 2015-08-11 DIAGNOSIS — R072 Precordial pain: Secondary | ICD-10-CM

## 2015-08-11 NOTE — Progress Notes (Signed)
[facility] PROGRESS NOTE    I had the pleasure of seeing Emily [name] today for cardiovascular follow up. She is a pleasant 69 y.o. female with a history of atypical chest discomfort with palpitations and a normal stress echo March 2016 who presents with palpitations that occurs perhaps twice a month lasting seconds and no near syncope.    No chest pain and no breathing trouble.      MEDICATIONS:    Current outpatient prescriptions:   .  aspirin 81 MG EC tablet, Take 81 mg by mouth daily., Disp: , Rfl:   .  levothyroxine (SYNTHROID, LEVOTHROID) 25 MCG tablet, Take 25 mcg by mouth daily., Disp: , Rfl:   .  lisinopril (PRINIVIL,ZESTRIL) 10 MG tablet, Take 1 tablet (10 mg total) by mouth daily., Disp: 90 tablet, Rfl: 3  .  omeprazole (PRILOSEC) 10 MG capsule, Take 2 capsules (20 mg total) by mouth daily., Disp: 90 capsule, Rfl: 3  .  simvastatin (ZOCOR) 40 MG tablet, Take 40 mg by mouth daily., Disp: , Rfl:        REVIEW OF SYSTEMS: All other systems reviewed and negative except as above.    PHYSICAL EXAMINATION  General Appearance: well-appearing and in no acute distress.   Vital Signs: BP 128/77 mmHg  Pulse 80  Ht 1.6 m (5\' 3" )  Wt 75.297 kg (166 lb)  BMI 29.41 kg/m2     Chest: Clear to auscultation bilaterally with good air movement and respiratory effort and no wheezes, rales, or rhonchi  Cardiovascular: Normal S1 and  S2 without murmurs, gallops or rub. PMI of normal size and nondisplaced.     Extremities: Warm without edema    ECG: nsr nssttwc I have personally reviewed and interpreted the EKG/Rhythm.    Past Medical History   Diagnosis Date   . Hypertension    . Hyperlipidemia    . Breast cancer 2014     right breast mastectomy     Family History   Problem Relation Age of Onset   . Breast cancer Neg Hx      Social History     Social History   . Marital Status: Widowed     Spouse Name: N/A   . Number of Children: N/A   . Years of Education: N/A     Social History Main Topics   . Smoking status: Never  Smoker    . Smokeless tobacco: Never Used   . Alcohol Use: No   . Drug Use: None   . Sexual Activity: Not Asked     Other Topics Concern   . None     Social History Narrative         IMPRESSION:  Infrequent few second palpitations with no associated symptoms. Low risk presentation with no further evaluation of this required.  No further chest discomfort      PLAN:   6 mos followup.       Royann Shivers, MD   08/11/2015

## 2015-09-06 ENCOUNTER — Emergency Department
Admission: EM | Admit: 2015-09-06 | Discharge: 2015-09-06 | Disposition: A | Discharge status: Home / Self Care | Payer: Medicare Other | Attending: Emergency Medicine | Admitting: Emergency Medicine

## 2015-09-06 ENCOUNTER — Emergency Department: Payer: Medicare Other

## 2015-09-06 DIAGNOSIS — Z7982 Long term (current) use of aspirin: Secondary | ICD-10-CM | POA: Insufficient documentation

## 2015-09-06 DIAGNOSIS — Z79899 Other long term (current) drug therapy: Secondary | ICD-10-CM | POA: Insufficient documentation

## 2015-09-06 DIAGNOSIS — R52 Pain, unspecified: Secondary | ICD-10-CM | POA: Insufficient documentation

## 2015-09-06 DIAGNOSIS — R001 Bradycardia, unspecified: Secondary | ICD-10-CM

## 2015-09-06 DIAGNOSIS — E785 Hyperlipidemia, unspecified: Secondary | ICD-10-CM | POA: Insufficient documentation

## 2015-09-06 DIAGNOSIS — Z853 Personal history of malignant neoplasm of breast: Secondary | ICD-10-CM | POA: Insufficient documentation

## 2015-09-06 DIAGNOSIS — I10 Essential (primary) hypertension: Secondary | ICD-10-CM | POA: Insufficient documentation

## 2015-09-06 LAB — CBC AND DIFFERENTIAL
Basophils Absolute Automated: 0.02 10*3/uL (ref 0.00–0.20)
Basophils Automated: 0 %
Eosinophils Absolute Automated: 0.07 10*3/uL (ref 0.00–0.70)
Eosinophils Automated: 1 %
Hematocrit: 43.8 % (ref 37.0–47.0)
Hgb: 13.7 g/dL (ref 12.0–16.0)
Immature Granulocytes Absolute: 0.01 10*3/uL
Immature Granulocytes: 0 %
Lymphocytes Absolute Automated: 1.74 10*3/uL (ref 0.50–4.40)
Lymphocytes Automated: 31 %
MCH: 26.8 pg — ABNORMAL LOW (ref 28.0–32.0)
MCHC: 31.3 g/dL — ABNORMAL LOW (ref 32.0–36.0)
MCV: 85.5 fL (ref 80.0–100.0)
MPV: 11.7 fL (ref 9.4–12.3)
Monocytes Absolute Automated: 0.34 10*3/uL (ref 0.00–1.20)
Monocytes: 6 %
Neutrophils Absolute: 3.46 10*3/uL (ref 1.80–8.10)
Neutrophils: 62 %
Nucleated RBC: 0 /100 WBC (ref 0–1)
Platelets: 314 10*3/uL (ref 140–400)
RBC: 5.12 10*6/uL (ref 4.20–5.40)
RDW: 14 % (ref 12–15)
WBC: 5.63 10*3/uL (ref 3.50–10.80)

## 2015-09-06 LAB — COMPREHENSIVE METABOLIC PANEL
ALT: 13 U/L (ref 0–55)
AST (SGOT): 25 U/L (ref 5–34)
Albumin/Globulin Ratio: 1.4 (ref 0.9–2.2)
Albumin: 4.6 g/dL (ref 3.5–5.0)
Alkaline Phosphatase: 99 U/L (ref 37–106)
Anion Gap: 14 (ref 5.0–15.0)
BUN: 13 mg/dL (ref 7–19)
Bilirubin, Total: 0.4 mg/dL (ref 0.2–1.2)
CO2: 26 mEq/L (ref 22–29)
Calcium: 10 mg/dL (ref 7.9–10.2)
Chloride: 104 mEq/L (ref 100–111)
Creatinine: 0.8 mg/dL (ref 0.6–1.0)
Globulin: 3.2 g/dL (ref 2.0–3.6)
Glucose: 93 mg/dL (ref 70–100)
Potassium: 5.2 mEq/L — ABNORMAL HIGH (ref 3.5–5.1)
Protein, Total: 7.8 g/dL (ref 6.0–8.3)
Sodium: 144 mEq/L (ref 136–145)

## 2015-09-06 LAB — URINALYSIS, REFLEX TO MICROSCOPIC EXAM IF INDICATED
Bilirubin, UA: NEGATIVE
Blood, UA: NEGATIVE
Glucose, UA: NEGATIVE
Ketones UA: 20 — AB
Nitrite, UA: NEGATIVE
Protein, UR: 30 — AB
Specific Gravity UA: 1.012 (ref 1.001–1.035)
Urine pH: 7 (ref 5.0–8.0)
Urobilinogen, UA: NEGATIVE mg/dL

## 2015-09-06 LAB — TROPONIN I: Troponin I: <0.01 ng/mL (ref 0.00–0.09)

## 2015-09-06 LAB — GFR: EGFR: >60

## 2015-09-06 MED ORDER — ACETAMINOPHEN-CODEINE 300-30 MG PO TABS
1.0000 | ORAL_TABLET | Freq: Four times a day (QID) | ORAL | Status: DC | PRN
Start: 2015-09-06 — End: 2015-10-19

## 2015-09-06 MED ORDER — ACETAMINOPHEN-CODEINE 300-30 MG PO TABS
1.0000 | ORAL_TABLET | Freq: Four times a day (QID) | ORAL | Status: DC | PRN
Start: 2015-09-06 — End: 2015-09-06

## 2015-09-06 MED ORDER — ACETAMINOPHEN 325 MG PO TABS
650.0000 mg | ORAL_TABLET | Freq: Once | ORAL | Status: AC
Start: 2015-09-06 — End: 2015-09-06
  Administered 2015-09-06: 650 mg via ORAL
  Filled 2015-09-06: qty 2

## 2015-09-06 MED ORDER — TRAMADOL HCL 50 MG PO TABS
50.0000 mg | ORAL_TABLET | Freq: Once | ORAL | Status: DC
Start: 2015-09-06 — End: 2015-09-06
  Filled 2015-09-06: qty 1

## 2015-09-06 NOTE — ED Notes (Signed)
Pt c/o generalized body pain starting on Friday, tylenol has not helped. C/o pain to arms, legs, back, shoulders, "everywhere" no fever/chills. No n/v/d.

## 2015-09-06 NOTE — Discharge Instructions (Signed)
Myalgia     You have been diagnosed with muscle aches (myalgia).     Inflammation (irritation) of the muscles causes myalgias.  This causes pain. Usually, this happens when the affected muscle is over-used or injured. Sometimes, the cause is a viral illness, or an autoimmune disease. There are many other possible causes.     One possible cause is due to a rare reaction to drugs called “statins.” These drugs lower cholesterol. In rare cases, they cause muscle pain or even muscle breakdown. Symptoms include muscle aches, soreness, tenderness, or weakness.     Statins include some of the following medications:      · Atorvastatin (Lipitor®); Luvastatin (Lescol®); lovastatin (Mevacor®, Altoprev®); pitavastatin (Livalo®); pravastatin (Pravachol®); rosuvastatin (Crestor®); simvastatin (Zocor®); others may become available.  · Combination medications like simvastatin/ezetimibe (Vytorin®).     Your doctor has decided, based on your exam today, that the cause of the muscle pains is not life-threatening or dangerous. Depending on the cause for your pain, you can expect your symptoms to get better over the next week. Sometimes the symptoms can last up to a few weeks.     We don’t believe your condition is dangerous right now. However, you need to be careful. Sometimes a problem that seems small can get serious later. Therefore, it is very important for you to come back here or go to the nearest Emergency Department if you don't get better or your symptoms get worse.      Your doctor may prescribe you pain medications to treat your pain. You can also use over-the-counter medicines like acetaminophen (Tylenol®) or anti-inflammatory medicine like ibuprofen (Advil®, Motrin®) or Naproxen (Aleve®, Naprosyn®).  It is important to follow the directions for taking these medications.     Some things you can do to help your injury are: Resting, Icing, Compressing and Elevating the injured area. Remember this as "RICE."     · REST: Limit  the use of the painful body part.      · ICE: By applying ice to the affected area, swelling and pain can be reduced. Place some ice cubes in a re-sealable (Ziploc®) bag and add some water. Put a thin washcloth between the bag and the skin. Apply the ice bag to the area for at least 20 minutes. Do this at least 4 times per day. It is okay to do this more often than directed. You can also do it for longer than directed. NEVER APPLY ICE DIRECTLY TO THE SKIN.      · COMPRESS: Compression means to apply pressure around the painful area such as with a splint, cast or an ace bandage. Compression decreases swelling and improves comfort. Compression should be tight enough to relieve swelling but not so tight as to decrease circulation. Increasing pain, numbness, tingling, or change in skin color, are all signs of decreased circulation.      · ELEVATE: Elevate the painful part.      When your pain starts to get better, you'll need to do gentle stretches with the injured muscle and work on increasing your range of motion.  This will help your muscles from getting stiff and make the symptoms not last as long.     YOU SHOULD SEEK MEDICAL ATTENTION IMMEDIATELY, EITHER HERE OR AT THE NEAREST EMERGENCY DEPARTMENT, IF ANY OF THE FOLLOWING OCCUR:     · Your symptoms haven't started to get better in 5-10 days.  · You start to have severe pain in the affected body part or the body part becomes pale, numb, and very firm to the touch.  · Your urine (pee) is the wrong   color. This can be a sign of muscle breakdown.     If you can't follow up with your doctor, or if at any time you feel you need to be rechecked or seen again, come back here or go to the nearest emergency department.

## 2015-09-06 NOTE — ED Notes (Signed)
Pt alert and oriented x4 vss stable for discharge home

## 2015-09-06 NOTE — ED Provider Notes (Signed)
EMERGENCY DEPARTMENT NOTE    Physician/Midlevel provider first contact with patient: 09/06/15 1250         HISTORY OF PRESENT ILLNESS   Historian:Patient  Translator Used: No    Chief Complaint: Pain       69 y.o. female history of hypertension, hyperlipidemia, breast cancer status post right mastectomy who presents with generalized body aches. Pt reports symptom onset was approximately 2 days ago. Initially started in her right upper extremity, and now has spread to upper and lower extremities bilaterally. Reports no fevers or chills. No chest or abdominal pain. No constant cough. Reports no recent travel. No falls or trauma.     1. Location of symptoms: total body   2. Onset of symptoms: 2 days  3. What was patient doing when symptoms started (Context): see above  4. Severity: moderate  5. Timing: constant  6. Activities that worsen symptoms: movement   7. Activities that improve symptoms: at rest  8. Quality: achy   9. Radiation of symptoms: no  10. Associated signs and Symptoms: see above  11. Are symptoms worsening? yes  MEDICAL HISTORY     Past Medical History:  Past Medical History   Diagnosis Date   . Hypertension    . Hyperlipidemia    . Breast cancer 2014     right breast mastectomy       Past Surgical History:  Past Surgical History   Procedure Laterality Date   . Mastectomy Right 2015       Social History:  Social History     Social History   . Marital Status: Widowed     Spouse Name: N/A   . Number of Children: N/A   . Years of Education: N/A     Occupational History   . Not on file.     Social History Main Topics   . Smoking status: Never Smoker    . Smokeless tobacco: Never Used   . Alcohol Use: No   . Drug Use: Not on file   . Sexual Activity: Not on file     Other Topics Concern   . Not on file     Social History Narrative       Family History:  Family History   Problem Relation Age of Onset   . Breast cancer Neg Hx        Outpatient Medication:  Discharge Medication List as of 09/06/2015  5:13 PM       CONTINUE these medications which have NOT CHANGED    Details   aspirin 81 MG EC tablet Take 81 mg by mouth daily., Until Discontinued, Historical Med      levothyroxine (SYNTHROID, LEVOTHROID) 25 MCG tablet Take 25 mcg by mouth daily., Starting 12/25/2013, Until Discontinued, Historical Med      lisinopril (PRINIVIL,ZESTRIL) 10 MG tablet Take 1 tablet (10 mg total) by mouth daily., Starting 12/17/2014, Until Discontinued, Normal      omeprazole (PRILOSEC) 10 MG capsule Take 2 capsules (20 mg total) by mouth daily., Starting 12/17/2014, Until Discontinued, Normal      simvastatin (ZOCOR) 40 MG tablet Take 40 mg by mouth daily., Starting 04/08/2014, Until Discontinued, Historical Med               REVIEW OF SYSTEMS   Review of Systems  Constitutional: Negative for fever and chills.   HENT: Negative for congestion and sore throat.  Eyes: Negative for eye discharge and eye redness.   Cardiovascular: Negative for chest  pain and chest pressure   Respiratory: Negative for cough and sputum production.    Gastrointestinal: Negative for nausea, vomiting, abdominal pain and diarrhea.   Genitourinary: Negative for dysuria, urgency and frequency.   MSK: Positive for extremity and back pain   Neurological: Negative for dizziness, focal weakness, numbness.   All other systems negative.  PHYSICAL EXAM   ED Triage Vitals   Enc Vitals Group      BP 09/06/15 1243 150/77 mmHg      Heart Rate 09/06/15 1243 61      Resp Rate 09/06/15 1243 16      Temp 09/06/15 1243 97.8 F (36.6 C)      Temp Source 09/06/15 1243 Oral      SpO2 09/06/15 1243 97 %      Weight 09/06/15 1243 79.379 kg      Height 09/06/15 1243 1.6 m      Head Cir --       Peak Flow --       Pain Score 09/06/15 1243 9      Pain Loc --       Pain Edu? --       Excl. in GC? --        Constitutional: Vital signs reviewed. Well appearing, well hydrated, well perfused, non-toxic appearing, no apparent distress  Head:  Normocephalic, atraumatic  Eyes: PERRL, normal conjunctiva  bilaterally, EOMI  ENT: Mucous membranes moist.  .  Neck: Normal range of motion. Non-tender.   Respiratory/Chest: clear to auscultation. No work of breathing. No tachypnea..  Cardiovascular: Regular rate and rhythm. No murmur.   Abdomen: Soft and nontender in all quadrants. No guarding or rebound. No masses or hepatosplenomegaly. -----.  UpperExtremity:  Pt discomfort with movement of shoulders bilaterally, however pt with FROM. Pt also with lower extremity discomfort with   LowerExtremity: No edema or cyanosis.   Neurological: Awake and alert. No focal motor deficits by observation.  Skin: Warm and dry. No rash.      MEDICAL DECISION MAKING     69 y.o. female history of hypertension, hyperlipidemia, breast cancer status post right mastectomy who presents with generalized body aches    Labs, thoracic film unremarkable   Will discharge home with outpatient follow-up       DISCUSSION        Vital Signs: Reviewed the patient?s vital signs.   Nursing Notes: Reviewed and utilized available nursing notes.  Medical Records Reviewed: Reviewed available past medical records.  Counseling: The emergency provider has spoken with the patient and discussed today?s findings, in addition to providing specific details for the plan of care.  Questions are answered and there is agreement with the plan.      CARDIAC STUDIES    The following cardiac studies were independently interpreted by the Emergency Medicine Physician.  For full cardiac study results please see chart.    Monitor Strip  Interpreted by ED Physician  Rate: 61  Rhythm: NSR   ST Changes: none      IMAGING STUDIES    The following imaging studies were independently interpreted by the Emergency Medicine Physician.  For full imaging study results please see chart.    PULSE OXIMETRY    Oxygen Saturation by Pulse Oximetry: 97%  Interventions: none  Interpretation: normal    EMERGENCY DEPT. MEDICATIONS      ED Medication Orders     Start Ordered     Status Ordering Provider     09/06/15 [phone]  09/06/15 1405  acetaminophen (TYLENOL) tablet 650 mg   Once     Route: Oral  Ordered Dose: 650 mg     Last MAR action:  Given ADJEI-TWUM, Khyra Viscuso    09/06/15 1308 09/06/15 1307     Once,   Status:  Discontinued     Route: Oral  Ordered Dose: 50 mg     Discontinued ADJEI-TWUM, Kieran Nachtigal          LABORATORY RESULTS    Ordered and independently interpreted AVAILABLE laboratory tests. Please see results section in chart for full details.  Results for orders placed or performed during the hospital encounter of 09/06/15   UA, Reflex to Microscopic   Result Value Ref Range    Urine Type Clean Catch     Color, UA Yellow Clear - Yellow    Clarity, UA Clear Clear - Hazy    Specific Gravity UA 1.012 1.001-1.035    Urine pH 7.0 5.0-8.0    Leukocyte Esterase, UA Small (A) Negative    Nitrite, UA Negative Negative    Protein, UR 30 (A) Negative    Glucose, UA Negative Negative    Ketones UA 20 (A) Negative    Urobilinogen, UA Negative 0.2  -  2.0 mg/dL    Bilirubin, UA Negative Negative    Blood, UA Negative Negative    RBC, UA 0 - 5 0 - 5 /hpf    WBC, UA 0 - 5 0 - 5 /hpf    Squamous Epithelial Cells, Urine 0 - 5 0 - 25 /hpf    Trans Epithel, UA 0 - 2 0 - 2 /hpf   Comprehensive Metabolic Panel (CMP)   Result Value Ref Range    Glucose 93 70 - 100 mg/dL    BUN 13 7 - 19 mg/dL    Creatinine 0.8 0.6 - 1.0 mg/dL    Sodium 540 981 - 191 mEq/L    Potassium 5.2 (H) 3.5 - 5.1 mEq/L    Chloride 104 100 - 111 mEq/L    CO2 26 22 - 29 mEq/L    Calcium 10.0 7.9 - 10.2 mg/dL    Protein, Total 7.8 6.0 - 8.3 g/dL    Albumin 4.6 3.5 - 5.0 g/dL    AST (SGOT) 25 5 - 34 U/L    ALT 13 0 - 55 U/L    Alkaline Phosphatase 99 37 - 106 U/L    Bilirubin, Total 0.4 0.2 - 1.2 mg/dL    Globulin 3.2 2.0 - 3.6 g/dL    Albumin/Globulin Ratio 1.4 0.9 - 2.2    Anion Gap 14.0 5.0 - 15.0   CBC with differential   Result Value Ref Range    WBC 5.63 3.50 - 10.80 x10 3/uL    Hgb 13.7 12.0 - 16.0 g/dL    Hematocrit 47.8 29.5 - 47.0 %    Platelets 314 140 -  400 x10 3/uL    RBC 5.12 4.20 - 5.40 x10 6/uL    MCV 85.5 80.0 - 100.0 fL    MCH 26.8 (L) 28.0 - 32.0 pg    MCHC 31.3 (L) 32.0 - 36.0 g/dL    RDW 14 12 - 15 %    MPV 11.7 9.4 - 12.3 fL    Neutrophils 62 None %    Lymphocytes Automated 31 None %    Monocytes 6 None %    Eosinophils Automated 1 None %    Basophils Automated 0 None %    Immature Granulocyte  0 None %    Nucleated RBC 0 0 - 1 /100 WBC    Neutrophils Absolute 3.46 1.80 - 8.10 x10 3/uL    Abs Lymph Automated 1.74 0.50 - 4.40 x10 3/uL    Abs Mono Automated 0.34 0.00 - 1.20 x10 3/uL    Abs Eos Automated 0.07 0.00 - 0.70 x10 3/uL    Absolute Baso Automated 0.02 0.00 - 0.20 x10 3/uL    Absolute Immature Granulocyte 0.01 0 x10 3/uL   GFR   Result Value Ref Range    EGFR >60.0    Troponin I   Result Value Ref Range    Troponin I <0.01 0.00 - 0.09 ng/mL       DIAGNOSIS      Diagnosis:  Final diagnoses:   Body aches       Disposition:  ED Disposition     Discharge Emily [name] discharge to home/self care.    Condition at disposition: Stable            Prescriptions:  Discharge Medication List as of 09/06/2015  5:13 PM      CONTINUE these medications which have CHANGED    Details   Acetaminophen-Codeine (TYLENOL/CODEINE #3) 300-30 MG per tablet Take 1 tablet by mouth every 6 (six) hours as needed for Pain (as needed for pain or cough)., Starting 09/06/2015, Until Discontinued, Print         CONTINUE these medications which have NOT CHANGED    Details   aspirin 81 MG EC tablet Take 81 mg by mouth daily., Until Discontinued, Historical Med      levothyroxine (SYNTHROID, LEVOTHROID) 25 MCG tablet Take 25 mcg by mouth daily., Starting 12/25/2013, Until Discontinued, Historical Med      lisinopril (PRINIVIL,ZESTRIL) 10 MG tablet Take 1 tablet (10 mg total) by mouth daily., Starting 12/17/2014, Until Discontinued, Normal      omeprazole (PRILOSEC) 10 MG capsule Take 2 capsules (20 mg total) by mouth daily., Starting 12/17/2014, Until Discontinued, Normal      simvastatin  (ZOCOR) 40 MG tablet Take 40 mg by mouth daily., Starting 04/08/2014, Until Discontinued, Historical Med                   Rulon Abide, MD  09/09/15 [phone]

## 2015-09-09 LAB — ECG 12-LEAD
Atrial Rate: 55 {beats}/min
Atrial Rate: 55 {beats}/min
P Axis: 37 degrees
P Axis: 40 degrees
P-R Interval: 174 ms
P-R Interval: 184 ms
Q-T Interval: 408 ms
Q-T Interval: 422 ms
QRS Duration: 90 ms
QRS Duration: 96 ms
QTC Calculation (Bezet): 390 ms
QTC Calculation (Bezet): 403 ms
R Axis: 25 degrees
R Axis: 26 degrees
T Axis: 35 degrees
T Axis: 36 degrees
Ventricular Rate: 55 {beats}/min
Ventricular Rate: 55 {beats}/min

## 2015-10-19 ENCOUNTER — Ambulatory Visit (INDEPENDENT_AMBULATORY_CARE_PROVIDER_SITE_OTHER): Payer: Medicare Other | Admitting: Hematology & Oncology

## 2015-10-19 ENCOUNTER — Encounter (INDEPENDENT_AMBULATORY_CARE_PROVIDER_SITE_OTHER): Payer: Self-pay | Admitting: Hematology & Oncology

## 2015-10-19 VITALS — BP 149/73 | HR 70 | Temp 98.0°F | Ht 61.0 in | Wt 169.0 lb

## 2015-10-19 DIAGNOSIS — C50811 Malignant neoplasm of overlapping sites of right female breast: Secondary | ICD-10-CM

## 2015-10-19 HISTORY — DX: Malignant neoplasm of overlapping sites of right female breast: C50.811

## 2015-10-19 MED ORDER — ANASTROZOLE 1 MG PO TABS
1.0000 mg | ORAL_TABLET | Freq: Every day | ORAL | Status: DC
Start: 2015-10-19 — End: 2016-07-22

## 2015-10-19 NOTE — Progress Notes (Signed)
HISTORY OF PRESENT ILLNESS  Emily [name] is a 70 y.o. female with a history of right breast cancer, status post mastectomy, who presents to establish care.  The patient was previously followed at [facility], but is transferring care to be closer to her home in [place]..      Per records, in 2014, the patient underwent routine mammographic screening on 08/14/13, with subsequent stereotactic guided core biopsy on 09/02/2013.  Pathology report revealed an infiltrating ductal carcinoma 1.2 cm, grade 2 with associated DCIS.  The patient underwent right total mastectomy and sentinel node biopsy.  Invasive tumor profile was ER positive, PR positive, HER-2/neu negative, Ki-67 20%. Sentinel node was negative. The patient's postoperative course was complicated by an infection at the expander site. The expander ultimately had to be removed.  She has undergone a total of 2 surgeries, and has been unable to have reconstructive surgery since.    The patient was placed on endocrine therapy with an aromatase inhibitor (anastrazole), which she is tolerating fairly well.  She has had consistent follow-up and screening, with no evidence of cancer recurrence. She underwent left-sided mammogram in September 2016, which was negative.       The patient states that she continues to have some problems with memory loss, which she feels occurred after the surgical procedures.  She is unable to remember some of the details of her surgical course.  She reports that she has intermittent discomfort at the right mastectomy site that is alleviated by Tylenol, ibuprofen or ice packs.  The areas of skin on the lateral aspect catch on her clothing and is uncomfortable for her.  She notes that sometimes the pain is related to activities such as cooking and doing household chores. She has noted no mass or skin changes at the mastectomy site.  She otherwise denies active complaints.        Allergies   Allergen Reactions   . Morphine      Current  Outpatient Prescriptions   Medication Sig Dispense Refill   . Acetaminophen-Codeine (TYLENOL/CODEINE #3) 300-30 MG per tablet Take 1 tablet by mouth every 6 (six) hours as needed for Pain (as needed for pain or cough). 15 tablet 0   . aspirin 81 MG EC tablet Take 81 mg by mouth daily.     Marland Kitchen levothyroxine (SYNTHROID, LEVOTHROID) 25 MCG tablet Take 25 mcg by mouth daily.     Marland Kitchen lisinopril (PRINIVIL,ZESTRIL) 10 MG tablet Take 1 tablet (10 mg total) by mouth daily. 90 tablet 3   . omeprazole (PRILOSEC) 10 MG capsule Take 2 capsules (20 mg total) by mouth daily. 90 capsule 3   . simvastatin (ZOCOR) 40 MG tablet Take 40 mg by mouth daily.       No current facility-administered medications for this visit.     Past Medical History   Diagnosis Date   . Hypertension    . Hyperlipidemia    . Breast cancer 2014     right breast mastectomy     Past Surgical History   Procedure Laterality Date   . Mastectomy Right 2015     Family History   Problem Relation Age of Onset   . Breast cancer Neg Hx      Social History     Social History   . Marital Status: Widowed     Spouse Name: N/A   . Number of Children: N/A   . Years of Education: N/A     Occupational History   .  Not on file.     Social History Main Topics   . Smoking status: Never Smoker    . Smokeless tobacco: Never Used   . Alcohol Use: No   . Drug Use: Not on file   . Sexual Activity: Not on file     Other Topics Concern   . Not on file     Social History Narrative     Review of Systems  A comprehensive review of systems was obtained and is negative except for that which is mentioned in the history of present illness.  Objective:   There were no vitals filed for this visit.     Physical Exam   Constitutional:  Well developed, well nourished, no acute distress, non-toxic appearance   Eyes:  PERRL, conjunctiva normal   HENT:  Atraumatic, external ears normal, nose normal, oropharynx moist, no pharyngeal exudates. Neck- normal range of motion, no tenderness, supple   Respiratory:   No respiratory distress, normal breath sounds, no rales, no wheezing   Cardiovascular:  Normal rate, normal rhythm, no murmurs, no gallops, no rubs   Breast:  Right breast surgically absent with well-healed mastectomy scar. Flaps have some redundancy medially and laterally.  Right axilla and supraclavicular fossa are unremarkable. She has fairly good range of motion, but some skin tethering at extension of the arm at the mastectomy site. Left breast is intact. There are no obvious skin or nipple lesions and no discrete dominant mass. Left axillary region with 0.5cm palpable region of induration, possibly secondary to surgical procedure.  GI:  Soft, nondistended, normal bowel sounds, nontender, no organomegaly, no mass, no rebound, no guarding   GU:  No costovertebral angle tenderness   Musculoskeletal:  No edema, no tenderness, no deformities. Back- no tenderness  Integument:  Well hydrated, no rash   Lymphatic:  No lymphadenopathy noted   Neurologic:  Alert & oriented x 3, CN 2-12 normal, normal motor function, normal sensory function, no focal deficits noted   Psychiatric:  Speech and behavior appropriate     RADIOLOGY DATA:   Mammography screening bilateral 08/14/2013  FINDINGS:  Bilateral CC and MLO projection digital mammograms were obtained. Tomosynthesis was not performed.     Suspicious and potentially malignant areas of microcalcification have developed in 2 separate regions within the right breast since prior study performed on 12/16/2011. Consideration for biopsy of these 2  regions is suggested. One of the areas of developing microcalcifications is located in the upper lateral portion of the right breast. The other area of developing microcalcification is in the inferior medial portion  of the right breast. The left breast appears unremarkable.       IMPRESSION:     2 areas of microcalcifications have developed in the right breast. One of these regions is in the upper outer portion of the right breast.  The other region is in the inferior medial portion of the right  breast. These areas would be amenable to stereotactic biopsy and biopsy is suggested.     BI-RADS Category 4, biopsy suggested of 2 separate regions of microcalcifications within the right breast.    PATHOLOGY DATA:   09/11/2013 Surgical Pathology Report,  Right Breast Tissue       A.  RIGHT BREAST CALCIFICATIONS, NEEDLE BIOPSIES:          -  DUCTAL CARCINOMA IN SITU, HIGH GRADE, COMEDO TYPE.          -  MICROCALCIFICATIONS PRESENT WITHIN THE TUMOR.  B  RIGHT BREAST CALCIFICATIONS, NEEDLE BIOPSIES:         -  DUCTAL CARCINOMA IN SITU, HIGH GRADE, COMEDO TYPE, WITH    MULTIPLE FOCI                 OF MICROINVASION.         -  MICROCALCIFICATIONS PRESENT WITHIN THE TUMOR.     ER    Percent Positive: 99.60%  Positive        Stain Intensity: 3    PR    Percent Positive: 33.48%  Positive        Stain Intensity: 1    Her2/neu   Stain Intensity: 2+   Equivocal    Ki-67    Percent Positive: 17%   Borderline       Assessment & Plan:   70 year old female with history of right-sided breast cancer, T1N0M0, status post right total mastectomy and SLNB, currently on endocrine therapy, presenting to establish care.    The patient is currently on a 5 year course of endocrine therapy, started early 2015, to be completed in 2020, with anastrozole 1 mg by mouth daily, which she is tolerating well and will continue.  We will give her a prescription today.    DEXA scan performed in September 2016 revealed osteopenia.  Given the potential side effect of decreased bone density with aromatase inhibitors, the patient would be indicated to start a bisphosphonate.  Our plan is to administer Reclast (zoledronic acid) once yearly.  Prior to this, the patient must undergo a dental evaluation, given the risk of osteonecrosis of the jaw with bisphosphonates.    The patient remains troubled with the discomfort at the right mastectomy site. She has previously expressed that she  might wish to have revision and reconsideration of additional reconstruction.  As the patient would like to establish care in this region, given the difficulty of traveling to [place], she will be referred to Dr. Derrill Center, a breast surgeon, for further evaluation and appropriate referral to plastic surgery for further management.    Preventative screening:  Status post left-sided mammogram in September 2016, which was negative.

## 2015-11-16 ENCOUNTER — Ambulatory Visit (INDEPENDENT_AMBULATORY_CARE_PROVIDER_SITE_OTHER): Payer: Medicare Other | Admitting: Hematology & Oncology

## 2015-11-16 ENCOUNTER — Encounter (INDEPENDENT_AMBULATORY_CARE_PROVIDER_SITE_OTHER): Payer: Self-pay | Admitting: Hematology & Oncology

## 2015-11-16 VITALS — BP 108/57 | HR 89

## 2015-11-16 DIAGNOSIS — C50811 Malignant neoplasm of overlapping sites of right female breast: Secondary | ICD-10-CM

## 2015-11-16 NOTE — Progress Notes (Signed)
ONCOLOGY HISTORY   Emily [name] is a 70 y.o. female with a history of right breast cancer, status post mastectomy, who presented to establish care in January 2017.  The patient was previously followed at [facility], but transferred care to be closer to her home in [place]      Per records, in 2014, the patient underwent routine mammographic screening on 08/14/13, with subsequent stereotactic guided core biopsy on 09/02/2013.  Pathology report revealed an infiltrating ductal carcinoma 1.2 cm, grade 2 with associated DCIS.  The patient underwent right total mastectomy and sentinel node biopsy.  Invasive tumor profile was ER positive, PR positive, HER-2/neu negative, Ki-67 20%. Sentinel node was negative. The patient's postoperative course was complicated by an infection at the expander site. The expander ultimately had to be removed.  She has undergone a total of 2 surgeries, and has been unable to have reconstructive surgery since.    The patient was placed on endocrine therapy with an aromatase inhibitor (anastrazole), which she is tolerating fairly well.  She has had consistent follow-up and screening, with no evidence of cancer recurrence. She underwent left-sided mammogram in September 2016, which was negative.       The patient states that she continues to have some problems with memory loss, which she feels occurred after the surgical procedures.  She is unable to remember some of the details of her surgical course.  She reports that she has intermittent discomfort at the right mastectomy site that is alleviated by Tylenol, ibuprofen or ice packs.  The areas of skin on the lateral aspect catch on her clothing and is uncomfortable for her.  She notes that sometimes the pain is related to activities such as cooking and doing household chores. She has noted no mass or skin changes at the mastectomy site.  She otherwise denies active complaints.      Interval History 11/16/15:  The patient presents for a  routine clinic follow-up visit.  She is tolerating the anastrozole well with no adverse side effects.  She has seen the dentist and was offered the option to undergo a root canal for a crack in the tooth, which she chooses not to do.  She reports limited range of motion in the right upper extremity and a sensation of tightness when she raises her arm, but otherwise reports feeling well overall and denies active complaints.        Allergies   Allergen Reactions   . Morphine      Current Outpatient Prescriptions   Medication Sig Dispense Refill   . anastrozole (ARIMIDEX) 1 MG tablet Take 1 tablet (1 mg total) by mouth daily. 90 tablet 2   . aspirin 81 MG EC tablet Take 81 mg by mouth daily.     Marland Kitchen levothyroxine (SYNTHROID, LEVOTHROID) 25 MCG tablet Take 25 mcg by mouth daily.     Marland Kitchen lisinopril (PRINIVIL,ZESTRIL) 10 MG tablet Take 1 tablet (10 mg total) by mouth daily. 90 tablet 3   . omeprazole (PRILOSEC) 10 MG capsule Take 2 capsules (20 mg total) by mouth daily. 90 capsule 3   . simvastatin (ZOCOR) 40 MG tablet Take 40 mg by mouth daily.       No current facility-administered medications for this visit.     Past Medical History   Diagnosis Date   . Hypertension    . Hyperlipidemia    . Breast cancer 2014     right breast mastectomy   . Malignant neoplasm of overlapping  sites of right female breast 10/19/2015     Past Surgical History   Procedure Laterality Date   . Mastectomy Right 2015     Family History   Problem Relation Age of Onset   . Breast cancer Neg Hx      Social History     Social History   . Marital Status: Widowed     Spouse Name: N/A   . Number of Children: N/A   . Years of Education: N/A     Occupational History   . Not on file.     Social History Main Topics   . Smoking status: Never Smoker    . Smokeless tobacco: Never Used   . Alcohol Use: No   . Drug Use: Not on file   . Sexual Activity: Not on file     Other Topics Concern   . Not on file     Social History Narrative     Review of Systems  A  comprehensive review of systems was obtained and is negative except for that which is mentioned in the history of present illness.  Objective:   There were no vitals filed for this visit.     Physical Exam   Constitutional:  Well developed, well nourished, no acute distress, non-toxic appearance   Eyes:  PERRL, conjunctiva normal   HENT:  Atraumatic, external ears normal, nose normal, oropharynx moist, no pharyngeal exudates. Neck- normal range of motion, no tenderness, supple   Respiratory:  No respiratory distress, normal breath sounds, no rales, no wheezing   Cardiovascular:  Normal rate, normal rhythm, no murmurs, no gallops, no rubs   Breast:  Right breast surgically absent with well-healed mastectomy scar. Flaps have some redundancy medially and laterally.  Right axilla and supraclavicular fossa are unremarkable. She has fairly good range of motion, but some skin tethering at extension of the arm at the mastectomy site. Left breast is intact. There are no obvious skin or nipple lesions and no discrete dominant mass. Left axillary region with 0.5cm palpable region of induration, possibly secondary to surgical procedure.  GI:  Soft, nondistended, normal bowel sounds, nontender, no organomegaly, no mass, no rebound, no guarding   GU:  No costovertebral angle tenderness   Musculoskeletal:  No edema, no tenderness, no deformities. Back- no tenderness  Integument:  Well hydrated, no rash   Lymphatic:  No lymphadenopathy noted   Neurologic:  Alert & oriented x 3, CN 2-12 normal, normal motor function, normal sensory function, no focal deficits noted   Psychiatric:  Speech and behavior appropriate     RADIOLOGY DATA:   Mammography screening bilateral 08/14/2013  FINDINGS:  Bilateral CC and MLO projection digital mammograms were obtained. Tomosynthesis was not performed.     Suspicious and potentially malignant areas of microcalcification have developed in 2 separate regions within the right breast since prior study  performed on 12/16/2011. Consideration for biopsy of these 2  regions is suggested. One of the areas of developing microcalcifications is located in the upper lateral portion of the right breast. The other area of developing microcalcification is in the inferior medial portion  of the right breast. The left breast appears unremarkable.       IMPRESSION:     2 areas of microcalcifications have developed in the right breast. One of these regions is in the upper outer portion of the right breast. The other region is in the inferior medial portion of the right  breast. These areas would be  amenable to stereotactic biopsy and biopsy is suggested.     BI-RADS Category 4, biopsy suggested of 2 separate regions of microcalcifications within the right breast.    PATHOLOGY DATA:   09/11/2013 Surgical Pathology Report,  Right Breast Tissue       A.  RIGHT BREAST CALCIFICATIONS, NEEDLE BIOPSIES:          -  DUCTAL CARCINOMA IN SITU, HIGH GRADE, COMEDO TYPE.          -  MICROCALCIFICATIONS PRESENT WITHIN THE TUMOR.        B  RIGHT BREAST CALCIFICATIONS, NEEDLE BIOPSIES:         -  DUCTAL CARCINOMA IN SITU, HIGH GRADE, COMEDO TYPE, WITH    MULTIPLE FOCI                 OF MICROINVASION.         -  MICROCALCIFICATIONS PRESENT WITHIN THE TUMOR.     ER    Percent Positive: 99.60%  Positive        Stain Intensity: 3    PR    Percent Positive: 33.48%  Positive        Stain Intensity: 1    Her2/neu   Stain Intensity: 2+   Equivocal    Ki-67    Percent Positive: 17%   Borderline       Assessment & Plan:   70 year old female with history of right-sided breast cancer, T1N0M0, status post right total mastectomy and SLNB, currently on endocrine therapy, presenting for clinic follow up visit.    The patient is currently on a 5 year course of endocrine therapy, started early 2015, to be completed in 2020, with anastrozole 1 mg by mouth daily, which she is tolerating well and will continue.  We will give her a prescription today.    DEXA scan  performed in September 2016 revealed osteopenia.  Given the potential side effect of decreased bone density with aromatase inhibitors, the patient would be indicated to start a bisphosphonate.  Our plan is to administer Reclast (zoledronic acid) once yearly.  She has undergone a dental evaluation, given the risk of osteonecrosis of the jaw with bisphosphonates, recommendation for an elective root canal.  Invasive oral treatments needed should be performed before starting the treatment with bisphosphonates, but the patient chooses not to undergo any dental procedures.    The patient remains troubled with the discomfort at the right mastectomy site. She has previously expressed that she might wish to have revision and reconsideration of additional reconstruction.  As the patient would like to establish care in this region, given the difficulty of traveling to [place], she will be referred to plastic surgery for further management at next visit (she wishes to wait several months before having a consultation).    Preventative screening:  Status post left-sided mammogram in September 2016, which was negative.

## 2015-11-17 LAB — COMPREHENSIVE METABOLIC PANEL
ALT: 8 U/L (ref 6–29)
AST (SGOT): 15 U/L (ref 10–35)
Albumin/Globulin Ratio: 1.5 (ref 1.0–2.5)
Albumin: 3.9 G/DL (ref 3.6–5.1)
Alkaline Phosphatase: 89 U/L (ref 33–130)
BUN: 13 MG/DL (ref 7–25)
Bilirubin, Total: 0.4 MG/DL (ref 0.2–1.2)
CO2: 28 mmol/L (ref 20–31)
Calcium: 8.8 MG/DL (ref 8.6–10.4)
Chloride: 104 mmol/L (ref 98–110)
Creatinine: 0.71 mg/dL (ref 0.60–0.93)
EGFR African American: 100 mL/min/{1.73_m2} (ref >=60)
EGFR: 86 mL/min/{1.73_m2} (ref >=60)
Globulin: 2.6 G/DL (ref 1.9–3.7)
Glucose: 95 MG/DL (ref 65–99)
Potassium: 4.4 mmol/L (ref 3.5–5.3)
Protein, Total: 6.5 G/DL (ref 6.1–8.1)
Sodium: 141 mmol/L (ref 135–146)

## 2016-01-13 ENCOUNTER — Other Ambulatory Visit (INDEPENDENT_AMBULATORY_CARE_PROVIDER_SITE_OTHER): Payer: Self-pay | Admitting: Cardiology

## 2016-01-14 ENCOUNTER — Other Ambulatory Visit (INDEPENDENT_AMBULATORY_CARE_PROVIDER_SITE_OTHER): Payer: Self-pay | Admitting: Cardiology

## 2016-01-14 DIAGNOSIS — R1013 Epigastric pain: Secondary | ICD-10-CM

## 2016-01-14 DIAGNOSIS — I10 Essential (primary) hypertension: Secondary | ICD-10-CM

## 2016-01-14 MED ORDER — LISINOPRIL 10 MG PO TABS
10.0000 mg | ORAL_TABLET | Freq: Every day | ORAL | Status: DC
Start: 2016-01-14 — End: 2016-02-25

## 2016-02-04 ENCOUNTER — Other Ambulatory Visit (INDEPENDENT_AMBULATORY_CARE_PROVIDER_SITE_OTHER): Payer: Self-pay

## 2016-02-04 ENCOUNTER — Encounter (INDEPENDENT_AMBULATORY_CARE_PROVIDER_SITE_OTHER): Payer: Self-pay | Admitting: Hematology & Oncology

## 2016-02-04 ENCOUNTER — Ambulatory Visit (INDEPENDENT_AMBULATORY_CARE_PROVIDER_SITE_OTHER): Payer: Medicare Other | Admitting: Hematology & Oncology

## 2016-02-04 VITALS — BP 157/79 | HR 69 | Temp 98.9°F | Wt 163.2 lb

## 2016-02-04 DIAGNOSIS — C50811 Malignant neoplasm of overlapping sites of right female breast: Secondary | ICD-10-CM

## 2016-02-04 NOTE — Progress Notes (Signed)
ONCOLOGY HISTORY   Emily [name] is a 70 y.o. female with a history of right breast cancer, status post mastectomy, who presented to establish care in January 2017.  The patient was previously followed at [facility], but transferred care to be closer to her home in [place]..      Per records, in 2014, the patient underwent routine mammographic screening on 08/14/13, with subsequent stereotactic guided core biopsy on 09/02/2013.  Pathology report revealed an infiltrating ductal carcinoma 1.2 cm, grade 2 with associated DCIS.  The patient underwent right total mastectomy and sentinel node biopsy.  Invasive tumor profile was ER positive, PR positive, HER-2/neu negative, Ki-67 20%. Sentinel node was negative. The patient's postoperative course was complicated by an infection at the expander site. The expander ultimately had to be removed.  She has undergone a total of 2 surgeries, and has been unable to have reconstructive surgery since.    The patient was placed on endocrine therapy with an aromatase inhibitor (anastrazole), which she is tolerating fairly well.  She has had consistent follow-up and screening, with no evidence of cancer recurrence. She underwent left-sided mammogram in September 2016, which was negative.       The patient states that she continues to have some problems with memory loss, which she feels occurred after the surgical procedures.  She is unable to remember some of the details of her surgical course.  She reports that she has intermittent discomfort at the right mastectomy site that is alleviated by Tylenol, ibuprofen or ice packs.  The areas of skin on the lateral aspect catch on her clothing and is uncomfortable for her.  She notes that sometimes the pain is related to activities such as cooking and doing household chores. She has noted no mass or skin changes at the mastectomy site.  She otherwise denies active complaints.      Interval History 02/04/16:  The patient presents for a  routine clinic follow-up visit.  She is tolerating the anastrozole well with no adverse side effects.  She has seen the dentist and was offered the option to undergo a root canal for a crack in the tooth, which she chooses not to do.      She reports persistent limited range of motion in the right upper extremity and a sensation of tightness when she raises her arm, but otherwise reports feeling well overall and denies active complaints.  Today, she states that she is finally ready to seek consultation with a plastic surgeon.        Allergies   Allergen Reactions   . Morphine      Current Outpatient Prescriptions   Medication Sig Dispense Refill   . anastrozole (ARIMIDEX) 1 MG tablet Take 1 tablet (1 mg total) by mouth daily. 90 tablet 2   . aspirin 81 MG EC tablet Take 81 mg by mouth daily.     Marland Kitchen levothyroxine (SYNTHROID, LEVOTHROID) 25 MCG tablet Take 25 mcg by mouth daily.     Marland Kitchen lisinopril (PRINIVIL,ZESTRIL) 10 MG tablet Take 1 tablet (10 mg total) by mouth daily. 90 tablet 1   . omeprazole (PRILOSEC) 10 MG capsule Take 2 capsules (20 mg total) by mouth daily. 90 capsule 3   . simvastatin (ZOCOR) 40 MG tablet Take 40 mg by mouth daily.       No current facility-administered medications for this visit.     Past Medical History   Diagnosis Date   . Hypertension    . Hyperlipidemia    .  Breast cancer 2014     right breast mastectomy   . Malignant neoplasm of overlapping sites of right female breast 10/19/2015     Past Surgical History   Procedure Laterality Date   . Mastectomy Right 2015     Family History   Problem Relation Age of Onset   . Breast cancer Neg Hx      Social History     Social History   . Marital Status: Widowed     Spouse Name: N/A   . Number of Children: N/A   . Years of Education: N/A     Occupational History   . Not on file.     Social History Main Topics   . Smoking status: Never Smoker    . Smokeless tobacco: Never Used   . Alcohol Use: No   . Drug Use: Not on file   . Sexual Activity: Not on  file     Other Topics Concern   . Not on file     Social History Narrative     Review of Systems  All systems were reviewed and are negative except for that which is mentioned in the history of present illness.    Objective:     Filed Vitals:    02/04/16 1107   BP: 157/79   Pulse: 69   Temp: 98.9 F (37.2 C)        Physical Exam   Constitutional:  Well developed, well nourished, no acute distress, non-toxic appearance   Eyes:  PERRL, conjunctiva normal   HENT:  Atraumatic, external ears normal, nose normal, oropharynx moist, no pharyngeal exudates. Neck- normal range of motion, no tenderness, supple   Respiratory:  No respiratory distress, normal breath sounds, no rales, no wheezing   Cardiovascular:  Normal rate, normal rhythm, no murmurs, no gallops, no rubs   Breast:  Right breast surgically absent with well-healed mastectomy scar. Flaps have some redundancy medially and laterally.  Right axilla and supraclavicular fossa are unremarkable. She has fairly good range of motion, but some skin tethering at extension of the arm at the mastectomy site. Left breast is intact. There are no obvious skin or nipple lesions and no discrete dominant mass. Left axillary region with 0.5cm palpable region of induration, possibly secondary to surgical procedure.  GI:  Soft, nondistended, normal bowel sounds, nontender, no organomegaly, no mass, no rebound, no guarding   GU:  No costovertebral angle tenderness   Musculoskeletal:  No edema, no tenderness, no deformities. Back- no tenderness  Integument:  Well hydrated, no rash   Lymphatic:  No lymphadenopathy noted   Neurologic:  Alert & oriented x 3, CN 2-12 normal, normal motor function, normal sensory function, no focal deficits noted   Psychiatric:  Speech and behavior appropriate     RADIOLOGY DATA:   Mammography screening bilateral 08/14/2013  FINDINGS:  Bilateral CC and MLO projection digital mammograms were obtained. Tomosynthesis was not performed.     Suspicious and  potentially malignant areas of microcalcification have developed in 2 separate regions within the right breast since prior study performed on 12/16/2011. Consideration for biopsy of these 2  regions is suggested. One of the areas of developing microcalcifications is located in the upper lateral portion of the right breast. The other area of developing microcalcification is in the inferior medial portion  of the right breast. The left breast appears unremarkable.       IMPRESSION:     2 areas of microcalcifications have developed in  the right breast. One of these regions is in the upper outer portion of the right breast. The other region is in the inferior medial portion of the right  breast. These areas would be amenable to stereotactic biopsy and biopsy is suggested.     BI-RADS Category 4, biopsy suggested of 2 separate regions of microcalcifications within the right breast.    PATHOLOGY DATA:   09/11/2013 Surgical Pathology Report,  Right Breast Tissue       A.  RIGHT BREAST CALCIFICATIONS, NEEDLE BIOPSIES:          -  DUCTAL CARCINOMA IN SITU, HIGH GRADE, COMEDO TYPE.          -  MICROCALCIFICATIONS PRESENT WITHIN THE TUMOR.        B  RIGHT BREAST CALCIFICATIONS, NEEDLE BIOPSIES:         -  DUCTAL CARCINOMA IN SITU, HIGH GRADE, COMEDO TYPE, WITH    MULTIPLE FOCI                 OF MICROINVASION.         -  MICROCALCIFICATIONS PRESENT WITHIN THE TUMOR.     ER    Percent Positive: 99.60%  Positive        Stain Intensity: 3    PR    Percent Positive: 33.48%  Positive        Stain Intensity: 1    Her2/neu   Stain Intensity: 2+   Equivocal    Ki-67    Percent Positive: 17%   Borderline       Assessment & Plan:   70 year old female with history of right-sided breast cancer, T1N0M0, status post right total mastectomy and SLNB, currently on endocrine therapy, presenting for clinic follow up visit.    The patient is currently on a 5 year course of endocrine therapy, started early 2015, to be completed in 2020, with  anastrozole 1 mg by mouth daily, which she is tolerating well and will continue.      DEXA scan performed in September 2016 revealed osteopenia.  Given the potential side effect of decreased bone density with aromatase inhibitors, the patient would be indicated to start a bisphosphonate.  Our plan is to administer Reclast (zoledronic acid) once yearly.  She has undergone a dental evaluation, given the risk of osteonecrosis of the jaw with bisphosphonates, recommendation for an elective root canal.  Invasive oral treatments needed should be performed before starting the treatment with bisphosphonates, but the patient chooses not to undergo any dental procedures.  She has not received Reclast yet and wishes to consider further.    The patient remains troubled with the discomfort at the right mastectomy site. She has previously expressed that she might wish to have revision and reconsideration of additional reconstruction.  As the patient would like to establish care in this region, given the difficulty of traveling to [place], she has been referred to plastic surgery for further evaluation Jim Like, MD in Plastic Surgery at [facility]).      I have also referred her to physical therapy for further evaluation and management, given her limited range of motion.    Left axillary induration: It is unclear whether there was some type of surgical procedure performed in the area versus lymphadenopathy.  The patient has no recollection of that timeframe but would like to undergo evaluation with an ultrasound in that region (ordered).    Preventative screening:  Status post left-sided mammogram in September 2016, which was negative.

## 2016-02-25 ENCOUNTER — Ambulatory Visit (INDEPENDENT_AMBULATORY_CARE_PROVIDER_SITE_OTHER): Payer: Medicare Other | Admitting: Cardiology

## 2016-02-25 ENCOUNTER — Encounter (INDEPENDENT_AMBULATORY_CARE_PROVIDER_SITE_OTHER): Payer: Self-pay | Admitting: Cardiology

## 2016-02-25 VITALS — BP 142/75 | HR 56 | Resp 16 | Ht 63.0 in | Wt 169.0 lb

## 2016-02-25 DIAGNOSIS — I10 Essential (primary) hypertension: Secondary | ICD-10-CM

## 2016-02-25 DIAGNOSIS — R072 Precordial pain: Secondary | ICD-10-CM

## 2016-02-25 DIAGNOSIS — R1013 Epigastric pain: Secondary | ICD-10-CM

## 2016-02-25 MED ORDER — LISINOPRIL 20 MG PO TABS
20.0000 mg | ORAL_TABLET | Freq: Every day | ORAL | Status: DC
Start: 2016-02-25 — End: 2016-08-09

## 2016-02-25 NOTE — Patient Instructions (Signed)
INCREASE LISINOPRIL TO 20 MG DAILY (TAKE TWO OF THE 10 MG PILLS DAILY UNTIL THEY ARE GONE AND THEN ONE OF THE NEW 20 MG PILLS DAILY)  CHECK BLOOD PRESSURE 3 TIMES A WEEK AT REST AND RECORD THE RESULTS AND BRING RESULTS TO THE NEXT OFFICE VISIT.  GOAL IS BELOW 140/90

## 2016-02-25 NOTE — Progress Notes (Signed)
[facility] PROGRESS NOTE    I had the pleasure of seeing Emily [name] today for cardiovascular follow up. She is a pleasant 70 y.o. female with a history of atypical chest discomfort with palpitations and a normal stress echo March 2016 who presents with occasional few seconds palpitation and pain left chest in different locations in one small spot lasting a few seconds like pinch occuring this morning and once last week in a different location.    Otherwise patient is ok. BP yesterday evening was 152/80. The patient checks blood pressure at home and finds the highest is 140 systolic.       MEDICATIONS:  Current Outpatient Prescriptions   Medication Sig Dispense Refill   . anastrozole (ARIMIDEX) 1 MG tablet Take 1 tablet (1 mg total) by mouth daily. 90 tablet 2   . aspirin 81 MG EC tablet Take 81 mg by mouth daily.     Marland Kitchen levothyroxine (SYNTHROID, LEVOTHROID) 25 MCG tablet Take 25 mcg by mouth daily.     Marland Kitchen lisinopril (PRINIVIL,ZESTRIL) 10 MG tablet Take 1 tablet (10 mg total) by mouth daily. 90 tablet 1   . omeprazole (PRILOSEC) 10 MG capsule Take 2 capsules (20 mg total) by mouth daily. 90 capsule 3   . simvastatin (ZOCOR) 40 MG tablet Take 40 mg by mouth daily.           REVIEW OF SYSTEMS: All other systems reviewed and negative except as above.    PHYSICAL EXAMINATION  General Appearance: well-appearing and in no acute distress.   Vital Signs: BP 142/75 mmHg  Pulse 56  Resp 16  Ht 1.6 m (5\' 3" )  Wt 76.658 kg (169 lb)  BMI 29.94 kg/m2     Chest: Clear to auscultation bilaterally with good air movement and respiratory effort and no wheezes, rales, or rhonchi  Cardiovascular: Normal S1 and  S2 without murmurs, gallops or rub. PMI of normal size and nondisplaced.   Abdomen: Soft, nontender. No organomegaly.  No pulsatile masses or bruits.    Extremities: Warm without edema.     ECG: nsr, nsstwc I have personally reviewed and interpreted the EKG/Rhythm.     Past Medical History   Diagnosis Date   .  Hypertension    . Hyperlipidemia    . Breast cancer 2014     right breast mastectomy   . Malignant neoplasm of overlapping sites of right female breast 10/19/2015     Family History   Problem Relation Age of Onset   . Breast cancer Neg Hx      Social History     Social History   . Marital Status: Widowed     Spouse Name: N/A   . Number of Children: N/A   . Years of Education: N/A     Social History Main Topics   . Smoking status: Never Smoker    . Smokeless tobacco: Never Used   . Alcohol Use: No   . Drug Use: None   . Sexual Activity: Not Asked     Other Topics Concern   . None     Social History Narrative         IMPRESSION:  Non cardiac chest discomfort and rare short palpitations  Blood pressure somewhat on the high side.      PLAN:   Increase lisinopril to 20 mg daily  Same other meds  6 mos follow up      Royann Shivers, MD   02/25/2016

## 2016-02-26 ENCOUNTER — Encounter (INDEPENDENT_AMBULATORY_CARE_PROVIDER_SITE_OTHER): Payer: Self-pay | Admitting: Cardiology

## 2016-03-10 ENCOUNTER — Ambulatory Visit (INDEPENDENT_AMBULATORY_CARE_PROVIDER_SITE_OTHER): Payer: Medicare Other | Admitting: Hematology & Oncology

## 2016-03-14 ENCOUNTER — Ambulatory Visit
Admission: RE | Admit: 2016-03-14 | Discharge: 2016-03-14 | Disposition: A | Discharge status: Home / Self Care | Payer: Medicare Other | Source: Ambulatory Visit | Attending: Hematology & Oncology | Admitting: Hematology & Oncology

## 2016-03-14 DIAGNOSIS — L723 Sebaceous cyst: Secondary | ICD-10-CM | POA: Insufficient documentation

## 2016-03-14 DIAGNOSIS — C50811 Malignant neoplasm of overlapping sites of right female breast: Secondary | ICD-10-CM | POA: Insufficient documentation

## 2016-03-14 DIAGNOSIS — N63 Unspecified lump in breast: Secondary | ICD-10-CM | POA: Insufficient documentation

## 2016-03-14 DIAGNOSIS — Z9011 Acquired absence of right breast and nipple: Secondary | ICD-10-CM | POA: Insufficient documentation

## 2016-03-30 ENCOUNTER — Other Ambulatory Visit: Payer: Self-pay | Admitting: Family Medicine

## 2016-03-30 ENCOUNTER — Ambulatory Visit
Admission: RE | Admit: 2016-03-30 | Discharge: 2016-03-30 | Disposition: A | Discharge status: Home / Self Care | Payer: Medicare Other | Source: Ambulatory Visit | Attending: Family Medicine | Admitting: Family Medicine

## 2016-03-30 DIAGNOSIS — M542 Cervicalgia: Secondary | ICD-10-CM

## 2016-03-30 DIAGNOSIS — M545 Low back pain: Secondary | ICD-10-CM

## 2016-04-23 ENCOUNTER — Emergency Department: Payer: Medicare Other

## 2016-04-23 ENCOUNTER — Emergency Department
Admission: EM | Admit: 2016-04-23 | Discharge: 2016-04-23 | Disposition: A | Discharge status: Home / Self Care | Payer: Medicare Other | Attending: Emergency Medicine | Admitting: Emergency Medicine

## 2016-04-23 DIAGNOSIS — Z79899 Other long term (current) drug therapy: Secondary | ICD-10-CM | POA: Insufficient documentation

## 2016-04-23 DIAGNOSIS — M5412 Radiculopathy, cervical region: Secondary | ICD-10-CM | POA: Insufficient documentation

## 2016-04-23 DIAGNOSIS — Z7982 Long term (current) use of aspirin: Secondary | ICD-10-CM | POA: Insufficient documentation

## 2016-04-23 DIAGNOSIS — Z853 Personal history of malignant neoplasm of breast: Secondary | ICD-10-CM | POA: Insufficient documentation

## 2016-04-23 DIAGNOSIS — E785 Hyperlipidemia, unspecified: Secondary | ICD-10-CM | POA: Insufficient documentation

## 2016-04-23 DIAGNOSIS — I10 Essential (primary) hypertension: Secondary | ICD-10-CM | POA: Insufficient documentation

## 2016-04-23 LAB — COMPREHENSIVE METABOLIC PANEL
ALT: 13 U/L (ref 0–55)
AST (SGOT): 19 U/L (ref 5–34)
Albumin/Globulin Ratio: 1.5 (ref 0.9–2.2)
Albumin: 4.6 g/dL (ref 3.5–5.0)
Alkaline Phosphatase: 96 U/L (ref 37–106)
Anion Gap: 8 (ref 5.0–15.0)
BUN: 11 mg/dL (ref 7–19)
Bilirubin, Total: 0.3 mg/dL (ref 0.2–1.2)
CO2: 32 mEq/L — ABNORMAL HIGH (ref 22–29)
Calcium: 9.7 mg/dL (ref 7.9–10.2)
Chloride: 104 mEq/L (ref 100–111)
Creatinine: 0.9 mg/dL (ref 0.6–1.0)
Globulin: 3 g/dL (ref 2.0–3.6)
Glucose: 93 mg/dL (ref 70–100)
Potassium: 4.3 mEq/L (ref 3.5–5.1)
Protein, Total: 7.6 g/dL (ref 6.0–8.3)
Sodium: 144 mEq/L (ref 136–145)

## 2016-04-23 LAB — CBC AND DIFFERENTIAL
Absolute NRBC: 0 10*3/uL
Basophils Absolute Automated: 0.04 10*3/uL (ref 0.00–0.20)
Basophils Automated: 0.6 %
Eosinophils Absolute Automated: 0.14 10*3/uL (ref 0.00–0.70)
Eosinophils Automated: 2 %
Hematocrit: 40 % (ref 37.0–47.0)
Hgb: 12.4 g/dL (ref 12.0–16.0)
Immature Granulocytes Absolute: 0.01 10*3/uL
Immature Granulocytes: 0.1 %
Lymphocytes Absolute Automated: 2.34 10*3/uL (ref 0.50–4.40)
Lymphocytes Automated: 33.3 %
MCH: 26.6 pg — ABNORMAL LOW (ref 28.0–32.0)
MCHC: 31 g/dL — ABNORMAL LOW (ref 32.0–36.0)
MCV: 85.7 fL (ref 80.0–100.0)
MPV: 11.4 fL (ref 9.4–12.3)
Monocytes Absolute Automated: 0.46 10*3/uL (ref 0.00–1.20)
Monocytes: 6.5 %
Neutrophils Absolute: 4.04 10*3/uL (ref 1.80–8.10)
Neutrophils: 57.5 %
Nucleated RBC: 0 /100 WBC (ref 0.0–1.0)
Platelets: 332 10*3/uL (ref 140–400)
RBC: 4.67 10*6/uL (ref 4.20–5.40)
RDW: 13 % (ref 12–15)
WBC: 7.03 10*3/uL (ref 3.50–10.80)

## 2016-04-23 LAB — GFR: EGFR: >60

## 2016-04-23 MED ORDER — KETOROLAC TROMETHAMINE 30 MG/ML IJ SOLN
15.0000 mg | Freq: Once | INTRAMUSCULAR | Status: AC
Start: 2016-04-23 — End: 2016-04-23
  Administered 2016-04-23: 15 mg via INTRAVENOUS
  Filled 2016-04-23: qty 1

## 2016-04-23 MED ORDER — SODIUM CHLORIDE 0.9 % IV BOLUS
1000.0000 mL | Freq: Once | INTRAVENOUS | Status: AC
Start: 2016-04-23 — End: 2016-04-23
  Administered 2016-04-23: 1000 mL via INTRAVENOUS

## 2016-04-23 MED ORDER — ONDANSETRON HCL 4 MG/2ML IJ SOLN
4.0000 mg | Freq: Once | INTRAMUSCULAR | Status: AC
Start: 2016-04-23 — End: 2016-04-23
  Administered 2016-04-23: 4 mg via INTRAVENOUS
  Filled 2016-04-23: qty 2

## 2016-04-23 MED ORDER — HYDROCODONE-ACETAMINOPHEN 5-325 MG PO TABS
1.0000 | ORAL_TABLET | ORAL | Status: DC | PRN
Start: 2016-04-23 — End: 2017-01-18

## 2016-04-23 MED ORDER — ACETAMINOPHEN 500 MG PO TABS
1000.0000 mg | ORAL_TABLET | Freq: Once | ORAL | Status: AC
Start: 2016-04-23 — End: 2016-04-23
  Administered 2016-04-23: 1000 mg via ORAL
  Filled 2016-04-23: qty 2

## 2016-04-23 MED ORDER — IBUPROFEN 400 MG PO TABS
400.0000 mg | ORAL_TABLET | Freq: Four times a day (QID) | ORAL | Status: AC | PRN
Start: 2016-04-23 — End: 2016-05-03

## 2016-04-23 NOTE — ED Notes (Signed)
I fully examined patient and took hx.  Woke up with pain right neck a couple days ago.  Pain right scapula, shoulder, neck and right scalp.  No cp or sob.  No numbness or tingling in an arm or leg.     Agree with evaluation by NP.  Anticipate negative ct and [place] home for fu pcp and possible PT.    Kennith Maes, MD  04/23/16 [phone]

## 2016-04-23 NOTE — ED Notes (Signed)
Pt A&O X4. VSS. Pt d/c home

## 2016-04-23 NOTE — Discharge Instructions (Signed)
Cervical Radiculopathy    You have been seen for a cervical radiculopathy.    Your spine has bones called "vertebrae." In between the bones there are soft cushions. These are called "disks." The disks keep the vertebrae from rubbing against each other. Inside of each disk is a thick, jelly-like substance called the "nucleus pulposus." Sometimes when the spine is injured, a disk is damaged and the nucleus pulposus leaks out of the disk. This is called a "disk herniation." As people age, the disks get brittle and delicate. If this happens, you might have a disk herniation. This is possible even if you don t remember getting injured.    Sometimes a herniated disk presses on a nerve in the spine. This causes pain near the herniated disk. Since you have symptoms in your neck and arm, the problem is in the cervical (neck) spine. Other problems can cause a radiculopathy (nerve pain), including a narrowed opening where the nerves come out.    Some symptoms of a cervical radiculopathy are:     Pain down one of your arms.   A burning feeling down your arm.   Numbness (pins and needles or loss of feeling) in your arm.   In serious cases, your arm may feel weak.    This problem is often treated with rest, physical therapy, and medication for the swelling and pain. If these treatments don t work, surgery may be needed. Sometimes taking steroids (like Prednisone) for a few days can help the pain.    We don't believe your condition is dangerous right now. However, you need to be careful. Sometimes a problem that seems small can get serious later. Therefore, it is very important for you to come back here or go to the nearest Emergency Department if you don t get better or your symptoms get worse.     You may have been referred to get an MRI of your spine or an EMG. These tests look at your problem more closely.    Follow up with your doctor or the referral doctor as soon as possible.    YOU SHOULD SEEK MEDICAL  ATTENTION IMMEDIATELY, EITHER HERE OR AT THE NEAREST EMERGENCY DEPARTMENT, IF ANY OF THE FOLLOWING OCCURS:     You lose bowel or bladder control (you soil or wet yourself).   You feel week or can t use your arm(s).   The medication doesn t help the pain.   You have a fever (temperature higher than 100.4F or 38C) or shaking chills.   You have serious pain over one bone (vertebra) in your neck.    If you can t follow up with your doctor, or if at any time you feel you need to be rechecked or seen again, come back here or go to the nearest emergency department.

## 2016-04-23 NOTE — ED Provider Notes (Signed)
Physician/Midlevel provider first contact with patient: 04/23/16 1723         History     Chief Complaint   Patient presents with   . Headache     Patient is a 71 y.o. female presenting with headaches. The history is provided by the patient.   Headache  Pain location:  R parietal  Quality:  Sharp  Radiates to:  R neck (right retro-orbital)  Severity currently:  8/10  Severity at highest:  8/10  Onset quality:  Sudden  Duration:  1 day  Timing:  Constant  Progression:  Unchanged  Chronicity:  New  Similar to prior headaches: no    Relieved by:  Nothing  Worsened by:  Nothing  Ineffective treatments:  NSAIDs  Associated symptoms: nausea, neck pain and tingling (right side of her face, yesterday)    Associated symptoms: no abdominal pain, no back pain, no blurred vision, no congestion, no cough, no diarrhea, no dizziness, no ear pain, no eye pain, no facial pain, no fever, no focal weakness, no hearing loss, no loss of balance, no myalgias, no neck stiffness, no numbness, no paresthesias, no photophobia, no sinus pressure, no sore throat, no syncope, no URI, no visual change, no vomiting and no weakness    Pt states developed right sided headache that radiates to the right side of her neck yesterday morning.  Describes the pain as constant and feels like stabbing.  States at times she also feels pain behind her right eye.  Had some eye watering yesterday.  No vision changes, no dizziness, no neck stiffness, no fever/chills, +nausea w/o vomiting, no CP/SOB, no abdominal pain, no sinus or ear congestion, no tinnitus.  States yesterday she felt tingling on the right side of her face but this has resolved.  No other focal numbness/tingling/weakness.  No back pain.  Denies h/o similar or any headaches.  Took aleve yesterday and this morning w/o relief.         Past Medical History   Diagnosis Date   . Hypertension    . Hyperlipidemia    . Breast cancer 2014     right breast mastectomy   . Malignant neoplasm of overlapping  sites of right female breast 10/19/2015       Past Surgical History   Procedure Laterality Date   . Mastectomy Right 2015       Family History   Problem Relation Age of Onset   . Breast cancer Neg Hx        Social  Social History   Substance Use Topics   . Smoking status: Never Smoker    . Smokeless tobacco: Never Used   . Alcohol Use: No       .     Allergies   Allergen Reactions   . Morphine        Home Medications     Last Medication Reconciliation Action:  In Progress Domingo Sep, RN 04/23/2016  5:23 PM                  anastrozole (ARIMIDEX) 1 MG tablet     Take 1 tablet (1 mg total) by mouth daily.     aspirin 81 MG EC tablet     Take 81 mg by mouth daily.     levothyroxine (SYNTHROID, LEVOTHROID) 25 MCG tablet     Take 25 mcg by mouth daily.     lisinopril (PRINIVIL,ZESTRIL) 20 MG tablet     Take 1 tablet (  20 mg total) by mouth daily.     omeprazole (PRILOSEC) 10 MG capsule     Take 2 capsules (20 mg total) by mouth daily.     simvastatin (ZOCOR) 40 MG tablet     Take 40 mg by mouth daily.           Review of Systems   Constitutional: Negative for fever.   HENT: Negative for congestion, ear pain, hearing loss, sinus pressure and sore throat.    Eyes: Negative for blurred vision, photophobia and pain.   Respiratory: Negative for cough.    Cardiovascular: Negative for syncope.   Gastrointestinal: Positive for nausea. Negative for vomiting, abdominal pain and diarrhea.   Musculoskeletal: Positive for neck pain. Negative for myalgias, back pain and neck stiffness.   Neurological: Positive for headaches. Negative for dizziness, focal weakness, weakness, numbness, paresthesias and loss of balance.   All other systems reviewed and are negative.      Physical Exam    BP: 169/77 mmHg, Heart Rate: 70, Temp: 98.6 F (37 C), Resp Rate: 18, SpO2: 97 %, Weight: 74.844 kg    Physical Exam   Constitutional: She is oriented to person, place, and time. She appears well-developed and well-nourished. She appears distressed  (mild).   HENT:   Head: Normocephalic and atraumatic.   Right Ear: External ear normal.   Left Ear: External ear normal.   Mouth/Throat: Oropharynx is clear and moist.   Bilateral TMs normal   Eyes: Conjunctivae and EOM are normal. Pupils are equal, round, and reactive to light. No scleral icterus.   Neck: Normal range of motion. Neck supple.   Cardiovascular: Normal rate, regular rhythm, normal heart sounds and intact distal pulses.    Pulmonary/Chest: Effort normal and breath sounds normal.   Abdominal: Soft. Bowel sounds are normal. She exhibits no distension. There is no tenderness.   Musculoskeletal: Normal range of motion. She exhibits tenderness (mild right sternocleidomastoid/trapezius). She exhibits no edema.   C/o increased right sided neck pain with rotation of head to the left and flexion of neck to the left   Neurological: She is alert and oriented to person, place, and time. She has normal reflexes. No cranial nerve deficit.   No focal neurological deficits, all extremities neurovascularly intact   Skin: Skin is warm and dry. No rash noted. No erythema.   Nursing note and vitals reviewed.        MDM and ED Course     ED Medication Orders     Start Ordered     Status Ordering Provider    04/23/16 1902 04/23/16 1901  ondansetron (ZOFRAN) injection 4 mg   Once     Route: Intravenous  Ordered Dose: 4 mg     Last MAR action:  Given Baird Kay     04/23/16 1902 04/23/16 1901  ketorolac (TORADOL) injection 15 mg   Once     Route: Intravenous  Ordered Dose: 15 mg     Last MAR action:  Given Baird Kay     04/23/16 1902 04/23/16 1901  acetaminophen (TYLENOL) tablet 1,000 mg   Once     Route: Oral  Ordered Dose: 1,000 mg     Last MAR action:  Given Baird Kay     04/23/16 1753 04/23/16 1752  sodium chloride 0.9 % bolus 1,000 mL   Once     Route: Intravenous  Ordered Dose: 1,000 mL     Last MAR action:  Stopped Latrelle Bazar,  Shaquanda Graves J              MDM  DDx including but not limited to:  IC  abnormality, CVA/TIA, migraine, musculoskeletal pain    O2 sat wnl on room air    Available past medical records reviewed    Case discussed with supervising physician, Dr. Crissie Figures, who also evaluated the pt.    Labs Reviewed   CBC AND DIFFERENTIAL - Abnormal; Notable for the following:     MCH 26.6 (*)     MCHC 31.0 (*)     All other components within normal limits   COMPREHENSIVE METABOLIC PANEL - Abnormal; Notable for the following:     CO2 32 (*)     All other components within normal limits   GFR     CT Head WO Contrast   Final Result    No CT evidence of acute process.      Ivin Poot, MD    04/23/2016 6:55 PM           Pt does not want medication here in ED that will cause sedation.    70 year old female with new onset right sided headache and right sided neck pain since yesterday morning.  C/o temporary right facial tingling that has resolved.  Reproducible pain to musculature of right side of neck and pain with head movement.  No focal neuro deficits on exam.  CT head negative.  Pain is likely musculoskeletal/pinched nerve, secondary to sleeping position.  No emergent findings found at this time, low suspicion for neurological abnormality.  EDP discussed home symptom management, strict return precautions.  F/u with PCP.  Pt verbalizes understanding and agrees with plan.        Procedures    Clinical Impression & Disposition     Clinical Impression  Final diagnoses:   Cervical radicular pain        ED Disposition     Discharge Toccara B Berndt discharge to home/self care.    Condition at disposition: Stable             Discharge Medication List as of 04/23/2016  7:16 PM      START taking these medications    Details   HYDROcodone-acetaminophen (NORCO) 5-325 MG per tablet Take 1-2 tablets by mouth every 4 (four) hours as needed for Pain., Starting 04/23/2016, Until Discontinued, Print      ibuprofen (ADVIL,MOTRIN) 400 MG tablet Take 1 tablet (400 mg total) by mouth every 6 (six) hours as needed for Pain.,  Starting 04/23/2016, Until Tue 05/03/16, Print                         Baird Kay, FNP  04/23/16 2121    Kennith Maes, MD  04/23/16 2138

## 2016-06-09 ENCOUNTER — Other Ambulatory Visit: Payer: Self-pay | Admitting: Family Medicine

## 2016-06-09 DIAGNOSIS — Z853 Personal history of malignant neoplasm of breast: Secondary | ICD-10-CM

## 2016-06-13 ENCOUNTER — Ambulatory Visit
Admission: RE | Admit: 2016-06-13 | Discharge: 2016-06-13 | Disposition: A | Discharge status: Home / Self Care | Payer: Medicare Other | Source: Ambulatory Visit | Attending: Family Medicine | Admitting: Family Medicine

## 2016-06-13 ENCOUNTER — Other Ambulatory Visit: Payer: Self-pay | Admitting: Family Medicine

## 2016-06-13 DIAGNOSIS — Z853 Personal history of malignant neoplasm of breast: Secondary | ICD-10-CM

## 2016-06-22 ENCOUNTER — Other Ambulatory Visit: Payer: Self-pay | Admitting: Family Medicine

## 2016-06-22 DIAGNOSIS — Z853 Personal history of malignant neoplasm of breast: Secondary | ICD-10-CM

## 2016-07-01 ENCOUNTER — Ambulatory Visit
Admission: RE | Admit: 2016-07-01 | Discharge: 2016-07-01 | Disposition: A | Discharge status: Home / Self Care | Payer: Medicare Other | Source: Ambulatory Visit | Attending: Family Medicine | Admitting: Family Medicine

## 2016-07-01 ENCOUNTER — Other Ambulatory Visit: Payer: Self-pay | Admitting: Family Medicine

## 2016-07-01 DIAGNOSIS — M549 Dorsalgia, unspecified: Secondary | ICD-10-CM | POA: Insufficient documentation

## 2016-07-22 ENCOUNTER — Other Ambulatory Visit (INDEPENDENT_AMBULATORY_CARE_PROVIDER_SITE_OTHER): Payer: Self-pay | Admitting: Hematology & Oncology

## 2016-07-22 ENCOUNTER — Telehealth (INDEPENDENT_AMBULATORY_CARE_PROVIDER_SITE_OTHER): Payer: Self-pay | Admitting: Hematology & Oncology

## 2016-07-22 DIAGNOSIS — C50811 Malignant neoplasm of overlapping sites of right female breast: Secondary | ICD-10-CM

## 2016-07-22 NOTE — Telephone Encounter (Signed)
Pt needs to have her appointments at the [facility]. Which of the doctors do you recommend?    [phone]

## 2016-07-22 NOTE — Telephone Encounter (Signed)
Pt needs anestrozole refill, 90 days.

## 2016-07-24 ENCOUNTER — Emergency Department: Payer: Medicare Other

## 2016-07-24 ENCOUNTER — Emergency Department
Admission: EM | Admit: 2016-07-24 | Discharge: 2016-07-24 | Disposition: A | Discharge status: Home / Self Care | Payer: Medicare Other | Attending: Emergency Medicine | Admitting: Emergency Medicine

## 2016-07-24 DIAGNOSIS — E785 Hyperlipidemia, unspecified: Secondary | ICD-10-CM | POA: Insufficient documentation

## 2016-07-24 DIAGNOSIS — R0789 Other chest pain: Secondary | ICD-10-CM | POA: Insufficient documentation

## 2016-07-24 DIAGNOSIS — M792 Neuralgia and neuritis, unspecified: Secondary | ICD-10-CM

## 2016-07-24 DIAGNOSIS — G629 Polyneuropathy, unspecified: Secondary | ICD-10-CM | POA: Insufficient documentation

## 2016-07-24 DIAGNOSIS — Z9011 Acquired absence of right breast and nipple: Secondary | ICD-10-CM | POA: Insufficient documentation

## 2016-07-24 DIAGNOSIS — Z79811 Long term (current) use of aromatase inhibitors: Secondary | ICD-10-CM | POA: Insufficient documentation

## 2016-07-24 DIAGNOSIS — Z853 Personal history of malignant neoplasm of breast: Secondary | ICD-10-CM | POA: Insufficient documentation

## 2016-07-24 DIAGNOSIS — I10 Essential (primary) hypertension: Secondary | ICD-10-CM | POA: Insufficient documentation

## 2016-07-24 DIAGNOSIS — Z7982 Long term (current) use of aspirin: Secondary | ICD-10-CM | POA: Insufficient documentation

## 2016-07-24 LAB — CBC AND DIFFERENTIAL
Absolute NRBC: 0 10*3/uL
Basophils Absolute Automated: 0.03 10*3/uL (ref 0.00–0.20)
Basophils Automated: 0.5 %
Eosinophils Absolute Automated: 0.05 10*3/uL (ref 0.00–0.70)
Eosinophils Automated: 0.8 %
Hematocrit: 38.9 % (ref 37.0–47.0)
Hgb: 12 g/dL (ref 12.0–16.0)
Immature Granulocytes Absolute: 0.01 10*3/uL
Immature Granulocytes: 0.2 %
Lymphocytes Absolute Automated: 2.16 10*3/uL (ref 0.50–4.40)
Lymphocytes Automated: 33.9 %
MCH: 27.2 pg — ABNORMAL LOW (ref 28.0–32.0)
MCHC: 30.8 g/dL — ABNORMAL LOW (ref 32.0–36.0)
MCV: 88.2 fL (ref 80.0–100.0)
MPV: 11.6 fL (ref 9.4–12.3)
Monocytes Absolute Automated: 0.43 10*3/uL (ref 0.00–1.20)
Monocytes: 6.8 %
Neutrophils Absolute: 3.69 10*3/uL (ref 1.80–8.10)
Neutrophils: 57.8 %
Nucleated RBC: 0 /100 WBC (ref 0.0–1.0)
Platelets: 321 10*3/uL (ref 140–400)
RBC: 4.41 10*6/uL (ref 4.20–5.40)
RDW: 13 % (ref 12–15)
WBC: 6.37 10*3/uL (ref 3.50–10.80)

## 2016-07-24 LAB — COMPREHENSIVE METABOLIC PANEL
ALT: 8 U/L (ref 0–55)
AST (SGOT): 17 U/L (ref 5–34)
Albumin/Globulin Ratio: 1.3 (ref 0.9–2.2)
Albumin: 3.7 g/dL (ref 3.5–5.0)
Alkaline Phosphatase: 79 U/L (ref 37–106)
Anion Gap: 8 (ref 5.0–15.0)
BUN: 13 mg/dL (ref 7–19)
Bilirubin, Total: 0.2 mg/dL (ref 0.2–1.2)
CO2: 28 mEq/L (ref 22–29)
Calcium: 9.2 mg/dL (ref 7.9–10.2)
Chloride: 108 mEq/L (ref 100–111)
Creatinine: 0.8 mg/dL (ref 0.6–1.0)
Globulin: 2.9 g/dL (ref 2.0–3.6)
Glucose: 103 mg/dL — ABNORMAL HIGH (ref 70–100)
Potassium: 4.1 mEq/L (ref 3.5–5.1)
Protein, Total: 6.6 g/dL (ref 6.0–8.3)
Sodium: 144 mEq/L (ref 136–145)

## 2016-07-24 LAB — ECG 12-LEAD
Atrial Rate: 57 {beats}/min
P Axis: 35 degrees
P-R Interval: 164 ms
Q-T Interval: 406 ms
QRS Duration: 94 ms
QTC Calculation (Bezet): 395 ms
R Axis: 27 degrees
T Axis: 20 degrees
Ventricular Rate: 57 {beats}/min

## 2016-07-24 LAB — GFR: EGFR: >60

## 2016-07-24 LAB — TROPONIN I: Troponin I: <0.01 ng/mL (ref 0.00–0.09)

## 2016-07-24 MED ORDER — GABAPENTIN 100 MG PO CAPS
100.0000 mg | ORAL_CAPSULE | Freq: Three times a day (TID) | ORAL | 0 refills | Status: DC
Start: 2016-07-24 — End: 2017-01-18

## 2016-07-24 MED ORDER — GABAPENTIN 100 MG PO CAPS
100.0000 mg | ORAL_CAPSULE | Freq: Three times a day (TID) | ORAL | Status: DC
Start: 2016-07-24 — End: 2016-07-24
  Administered 2016-07-24: 100 mg via ORAL
  Filled 2016-07-24: qty 1

## 2016-07-24 MED ORDER — ACETAMINOPHEN 325 MG PO TABS
650.0000 mg | ORAL_TABLET | Freq: Once | ORAL | Status: DC
Start: 2016-07-24 — End: 2016-07-24
  Filled 2016-07-24: qty 2

## 2016-07-24 NOTE — Discharge Instructions (Signed)
Neuropathy, General    You were diagnosed with neuropathy.    Neuropathy is caused by damage to your nerves. Neuropathy often affects the small nerves of the hands and feet. This causes numbness, tingling or pain. Damage can also be done to larger nerves in the body. This sometimes leads to balance problems, low blood pressure and gastroparesis (slow emptying of the stomach).   Burning sensation.   Numbness.   Tingling.   Pain or being sensitive to touch.    These symptoms are different in everyone. Some people may have a lot of pain and others may have only a little bit of numbness or tingling. The condition may get worse over time and people can lose the ability to feel pain in the affected areas.     Though we don't believe your condition is dangerous right now, it is important to be careful. Sometimes a problem that seems mild can become serious later. This is why it is very important that you return here or go to the nearest Emergency Department if you are not improving or your symptoms are getting worse.    If the nerves in your feet are affected, you are at higher risk of injury to your feet. You should take certain precautions to take care of your feet:     Look at your feet every day for skin breaks or tears.   Wash your feet every day to keep away small particles that can cause injury.   Wear dry socks, and keep your feet dry to prevent skin breakdown.   Your doctor may prescribe special shoes that protect your feet from injury.    If you do see any wounds on your feet, you should contact your doctor. He/she will create a treatment plan that will help them to heal.     YOU SHOULD SEEK MEDICAL ATTENTION IMMEDIATELY, EITHER HERE OR AT THE NEAREST EMERGENCY DEPARTMENT, IF ANY OF THE FOLLOWING OCCUR:   You see any new skin tears, ulcers, or blisters on your feet.   Your pain is not under control with your current pain medicine.   You have wounds on your feet that have pus, look black, or  are getting worse.   You have severe pain when you walk or put weight on your feet.    If you can t follow up with your doctor, or if at any time you feel you need to be rechecked or seen again, come back here or go to the nearest emergency department.              Musculoskeletal Chest Pain    You have been diagnosed with musculoskeletal chest pain.    Your pain is due to an injury or inflammation (swelling) of the muscles, ligaments, cartilage (soft bone), or bone in your chest. The pain is usually sharp and knife-like and becomes worse with twisting, bending, or moving. It commonly occurs in a small area, and can be irritated by pressing on it. There is usually no shortness of breath, lightheadedness, weakness, or sweaty feeling. Some children will have pain when taking a deep breath or when coughing. Exercise usually does not affect these symptoms.    Musculoskeletal chest pain is treated with anti-inflammatory medications like ibuprofen (Advil or Motrin) or naproxen (Aleve). Other pain medications are usually not needed. Depending on the reason for your symptoms, either warm or cool compresses (damp washcloths laid on the skin) may be helpful.    Most musculoskeletal chest pain  improves over several days.    You do not need to follow up with a doctor unless your symptoms get worse or fail to improve in the next few days.    YOU SHOULD SEEK MEDICAL ATTENTION IMMEDIATELY, EITHER HERE OR AT THE NEAREST EMERGENCY DEPARTMENT, IF ANY OF THE FOLLOWING OCCURS:   Your pain gets worse.   Your pain makes you feel short of breath, nauseated, or sweaty.   You notice that your pain gets worse as you walk, go up stairs, or exert yourself.   You have any weakness or lightheadedness with your pain.   Your pain makes breathing difficult.   You develop a swollen leg.   Your symptoms get worse or you have other concerns.             Musculoskeletal Chest Pain    You have been diagnosed with musculoskeletal chest  pain.    Your pain is due to an injury or inflammation (swelling) of the muscles, ligaments, cartilage (soft bone), or bone in your chest. The pain is usually sharp and knife-like and becomes worse with twisting, bending, or moving. It commonly occurs in a small area, and can be irritated by pressing on it. There is usually no shortness of breath, lightheadedness, weakness, or sweaty feeling. Some children will have pain when taking a deep breath or when coughing. Exercise usually does not affect these symptoms.    Musculoskeletal chest pain is treated with anti-inflammatory medications like ibuprofen (Advil or Motrin) or naproxen (Aleve). Other pain medications are usually not needed. Depending on the reason for your symptoms, either warm or cool compresses (damp washcloths laid on the skin) may be helpful.    Most musculoskeletal chest pain improves over several days.    You do not need to follow up with a doctor unless your symptoms get worse or fail to improve in the next few days.    YOU SHOULD SEEK MEDICAL ATTENTION IMMEDIATELY, EITHER HERE OR AT THE NEAREST EMERGENCY DEPARTMENT, IF ANY OF THE FOLLOWING OCCURS:   Your pain gets worse.   Your pain makes you feel short of breath, nauseated, or sweaty.   You notice that your pain gets worse as you walk, go up stairs, or exert yourself.   You have any weakness or lightheadedness with your pain.   Your pain makes breathing difficult.   You develop a swollen leg.   Your symptoms get worse or you have other concerns.

## 2016-07-26 NOTE — ED Provider Notes (Signed)
EMERGENCY DEPARTMENT NOTE    Physician/Midlevel provider first contact with patient: 07/24/16 1050         HISTORY OF PRESENT ILLNESS   Historian:Patient  Translator Used: No    Chief Complaint: Breast Pain (Right breast pain and right arm tingling x4days. Worse since yesterday. Denies injury. Has had dry cough x4-days. )         70 y.o. female with hx of breast cancer, right breast mastectomy and with revision due to infection in 2015 at [facility] presents to the ed right breast pain radiating to right arm for 4 days. Denies chest pain or sob. Denies nausea or vomiting.  States she has a burning sensation in right breast.  Denies rash.  Denies fever or chills. Denies nausea or vomiting.  Denies numbness or weakness of extremities.     1. Location of symptoms: right breast  2. Onset of symptoms: 4 days  3. What was patient doing when symptoms started (Context): see above  4. Severity: moderate  5. Timing: constant  6. Activities that worsen symptoms: nothing  7. Activities that improve symptoms:  nothing  8. Quality: burning  9. Radiation of symptoms: to right arm  10. Associated signs and Symptoms: see above  11. Are symptoms worsening? yes  MEDICAL HISTORY     Past Medical History:  Past Medical History:   Diagnosis Date   . Breast cancer 2014    right breast mastectomy   . Hyperlipidemia    . Hypertension    . Malignant neoplasm of overlapping sites of right female breast 10/19/2015       Past Surgical History:  Past Surgical History:   Procedure Laterality Date   . HYSTERECTOMY     . MASTECTOMY Right 2015       Social History:  Social History     Social History   . Marital status: Widowed     Spouse name: N/A   . Number of children: N/A   . Years of education: N/A     Occupational History   . Not on file.     Social History Main Topics   . Smoking status: Never Smoker   . Smokeless tobacco: Never Used   . Alcohol use Yes      Comment: socially   . Drug use: Unknown   . Sexual activity: Not on file      Other Topics Concern   . Not on file     Social History Narrative   . No narrative on file       Family History:  Family History   Problem Relation Age of Onset   . Breast cancer Neg Hx        Outpatient Medication:  Discharge Medication List as of 07/24/2016  1:29 PM      CONTINUE these medications which have NOT CHANGED    Details   anastrozole (ARIMIDEX) 1 MG tablet TAKE ONE TABLET BY MOUTH ONCE DAILY, Normal      aspirin 81 MG EC tablet Take 81 mg by mouth daily., Until Discontinued, Historical Med      HYDROcodone-acetaminophen (NORCO) 5-325 MG per tablet Take 1-2 tablets by mouth every 4 (four) hours as needed for Pain., Starting 04/23/2016, Until Discontinued, Print      levothyroxine (SYNTHROID, LEVOTHROID) 25 MCG tablet Take 25 mcg by mouth daily., Starting 12/25/2013, Until Discontinued, Historical Med      lisinopril (PRINIVIL,ZESTRIL) 20 MG tablet Take 1 tablet (20 mg total) by  mouth daily., Starting 02/25/2016, Until Discontinued, Normal      omeprazole (PRILOSEC) 10 MG capsule Take 2 capsules (20 mg total) by mouth daily., Starting 12/17/2014, Until Discontinued, Normal      simvastatin (ZOCOR) 40 MG tablet Take 40 mg by mouth daily., Starting 04/08/2014, Until Discontinued, Historical Med               REVIEW OF SYSTEMS   Review of Systems   Constitutional: Negative for fever.   Respiratory: Negative for shortness of breath.    Cardiovascular: Negative for chest pain.   Gastrointestinal: Negative for nausea and vomiting.   Skin:        Right breast burning   Neurological: Negative for tingling and focal weakness.         PHYSICAL EXAM     ED Triage Vitals   Enc Vitals Group      BP 07/24/16 1106 180/76      Heart Rate 07/24/16 1106 64      Resp Rate 07/24/16 1106 16      Temp 07/24/16 1106 98.6 F (37 C)      Temp Source 07/24/16 1106 Oral      SpO2 07/24/16 1228 98 %      Weight 07/24/16 1106 77.2 kg      Height 07/24/16 1106 1.6 m      Head Circumference --       Peak Flow --       Pain Score  07/24/16 1106 7      Pain Loc --       Pain Edu? --       Excl. in GC? --      Nursing note and vitals reviewed.  Constitutional:  Well developed, well nourished. Awake & Oriented x3.  Head:  Atraumatic. Normocephalic.    Eyes:  PERRL. EOMI. Conjunctivae are not pale.  ENT:  Mucous membranes are moist and intact. Oropharynx is clear and symmetric.  Patent airway.  Neck:  Supple. Full ROM.    Cardiovascular:  Regular rate. Regular rhythm. No murmurs, rubs, or gallops.  Pulmonary/Chest:  No evidence of respiratory distress. Clear to auscultation bilaterally.  No wheezing, rales or rhonchi. Right chest wall: deformed right chest wall skin. + tenderness to palpation right chest wall.  No erythema, warmth, fluctuance or rash. No lesions.    Abdominal:  Soft and non-distended. There is no tenderness. No rebound, guarding, or rigidity.  Back:  Full ROM. Nontender.  Extremities:  No edema. No cyanosis. No clubbing. Full range of motion in all extremities. Full rom right shoulder, elbow and werist.   Skin:  Skin is warm and dry.  No diaphoresis. No rash.   Neurological:  Alert, awake, and appropriate. Normal speech. Motor normal. + 5/5 grip strength bilaterally. No pronator drift.    Psychiatric:  Good eye contact. Normal interaction, affect, and behavior.        MEDICAL DECISION MAKING   Pt given IV fluids for hydration  CXR to r/o pneumonia  Troponin to r/o ischemia      Case discussed with patient's plastic surgeon at [facility], Dr Azucena Kuba who states have pt call office for appointment    Symptoms likely due to neuropathic pain of right chest wall   Differential diagnosis includes costochondritis    Will discharge patient.   Instructed to take medications as prescribed.   Instructed to follow up with DR Chelsea Aus   Instructed to return for worsening symptoms.  Pt agrees with the plan  DISCUSSION          Vital Signs: Reviewed the patient?s vital signs.   Nursing Notes: Reviewed and utilized available nursing  notes.  Medical Records Reviewed: Reviewed available past medical records.  Counseling: The emergency provider has spoken with the patient and discussed today?s findings, in addition to providing specific details for the plan of care.  Questions are answered and there is agreement with the plan.        CARDIAC STUDIES    The following cardiac studies were independently interpreted by the Emergency Medicine Physician.  For full cardiac study results please see chart.    Monitor Strip  Interpreted by ED Physician  Rate: 61  Rhythm: NSR   ST Changes: none    EKG Interpretation:  Signed and interpreted byED Physician   Time Interpreted: 1130  Comparison: 09/06/15  Rate: 57  Rhythm: sinus bradycardia  Axis: normal  Intervals: normal  Blocks: none  ST segments: nml  Interpretation: Nonspecific  EKG    EMERGENCY IMAGING STUDIES    The following imagine studies were independently interpreted by me (emergency physician):    Radiology:  Interpreted by me (ED Physician)  Study: Chest Xray   Results: No infiltrate. No pneumothorax. No hemothorax. No cardiomegaly. No CHF.  Impression: No acute intrathoracic abnormality.        RADIOLOGY IMAGING STUDIES      Chest AP Portable   Final Result    No radiographic evidence for acute cardiopulmonary disease.      Lorenda Peck, MD    07/24/2016 12:19 PM                 PULSE OXIMETRY    Oxygen Saturation by Pulse Oximetry: 98%  Interventions: none  Interpretation:  nml    EMERGENCY DEPT. MEDICATIONS      ED Medication Orders     Start Ordered     Status Ordering Provider    07/24/16 1400 07/24/16 1328    Every 8 hours scheduled     Route: Oral  Ordered Dose: 100 mg     Discontinued Loetta Rough    07/24/16 1123 07/24/16 1122    Once     Route: Oral  Ordered Dose: 650 mg     Discontinued Marshell Rieger G          LABORATORY RESULTS    Ordered and independently interpreted AVAILABLE laboratory tests. Please see results section in chart for full details.  Results for orders placed or  performed during the hospital encounter of 07/24/16   CBC with differential   Result Value Ref Range    WBC 6.37 3.50 - 10.80 x10 3/uL    Hgb 12.0 12.0 - 16.0 g/dL    Hematocrit 57.8 46.9 - 47.0 %    Platelets 321 140 - 400 x10 3/uL    RBC 4.41 4.20 - 5.40 x10 6/uL    MCV 88.2 80.0 - 100.0 fL    MCH 27.2 (L) 28.0 - 32.0 pg    MCHC 30.8 (L) 32.0 - 36.0 g/dL    RDW 13 12 - 15 %    MPV 11.6 9.4 - 12.3 fL    Neutrophils 57.8 None %    Lymphocytes Automated 33.9 None %    Monocytes 6.8 None %    Eosinophils Automated 0.8 None %    Basophils Automated 0.5 None %    Immature Granulocyte 0.2 None %  Nucleated RBC 0.0 0.0 - 1.0 /100 WBC    Neutrophils Absolute 3.69 1.80 - 8.10 x10 3/uL    Abs Lymph Automated 2.16 0.50 - 4.40 x10 3/uL    Abs Mono Automated 0.43 0.00 - 1.20 x10 3/uL    Abs Eos Automated 0.05 0.00 - 0.70 x10 3/uL    Absolute Baso Automated 0.03 0.00 - 0.20 x10 3/uL    Absolute Immature Granulocyte 0.01 0 x10 3/uL    Absolute NRBC 0.00 0 x10 3/uL   Troponin I   Result Value Ref Range    Troponin I <0.01 0.00 - 0.09 ng/mL   Comprehensive metabolic panel   Result Value Ref Range    Glucose 103 (H) 70 - 100 mg/dL    BUN 13 7 - 19 mg/dL    Creatinine 0.8 0.6 - 1.0 mg/dL    Sodium 914 782 - 956 mEq/L    Potassium 4.1 3.5 - 5.1 mEq/L    Chloride 108 100 - 111 mEq/L    CO2 28 22 - 29 mEq/L    Calcium 9.2 7.9 - 10.2 mg/dL    Protein, Total 6.6 6.0 - 8.3 g/dL    Albumin 3.7 3.5 - 5.0 g/dL    AST (SGOT) 17 5 - 34 U/L    ALT 8 0 - 55 U/L    Alkaline Phosphatase 79 37 - 106 U/L    Bilirubin, Total 0.2 0.2 - 1.2 mg/dL    Globulin 2.9 2.0 - 3.6 g/dL    Albumin/Globulin Ratio 1.3 0.9 - 2.2    Anion Gap 8.0 5.0 - 15.0   GFR   Result Value Ref Range    EGFR >60.0    ECG 12 Lead   Result Value Ref Range    Ventricular Rate 57 BPM    Atrial Rate 57 BPM    P-R Interval 164 ms    QRS Duration 94 ms    Q-T Interval 406 ms    QTC Calculation (Bezet) 395 ms    P Axis 35 degrees    R Axis 27 degrees    T Axis 20 degrees       CRITICAL  CARE/PROCEDURES    Procedures      DIAGNOSIS      Diagnosis:  Final diagnoses:   Right-sided chest wall pain   Neuropathic pain       Disposition:  ED Disposition     ED Disposition Condition Date/Time Comment    Discharge  Sun Jul 24, 2016  1:29 PM Emily [name] discharge to home/self care.    Condition at disposition: Stable          Prescriptions:  Discharge Medication List as of 07/24/2016  1:29 PM      CONTINUE these medications which have NOT CHANGED    Details   anastrozole (ARIMIDEX) 1 MG tablet TAKE ONE TABLET BY MOUTH ONCE DAILY, Normal      aspirin 81 MG EC tablet Take 81 mg by mouth daily., Until Discontinued, Historical Med      HYDROcodone-acetaminophen (NORCO) 5-325 MG per tablet Take 1-2 tablets by mouth every 4 (four) hours as needed for Pain., Starting 04/23/2016, Until Discontinued, Print      levothyroxine (SYNTHROID, LEVOTHROID) 25 MCG tablet Take 25 mcg by mouth daily., Starting 12/25/2013, Until Discontinued, Historical Med      lisinopril (PRINIVIL,ZESTRIL) 20 MG tablet Take 1 tablet (20 mg total) by mouth daily., Starting 02/25/2016, Until Discontinued, Normal  omeprazole (PRILOSEC) 10 MG capsule Take 2 capsules (20 mg total) by mouth daily., Starting 12/17/2014, Until Discontinued, Normal      simvastatin (ZOCOR) 40 MG tablet Take 40 mg by mouth daily., Starting 04/08/2014, Until Discontinued, Historical Med                Loetta Rough, DO  07/26/16 1101

## 2016-08-09 ENCOUNTER — Ambulatory Visit (INDEPENDENT_AMBULATORY_CARE_PROVIDER_SITE_OTHER): Payer: Medicare Other | Admitting: Cardiology

## 2016-08-09 ENCOUNTER — Encounter (INDEPENDENT_AMBULATORY_CARE_PROVIDER_SITE_OTHER): Payer: Self-pay | Admitting: Cardiology

## 2016-08-09 VITALS — BP 159/85 | HR 59 | Ht 63.0 in | Wt 166.0 lb

## 2016-08-09 DIAGNOSIS — R072 Precordial pain: Secondary | ICD-10-CM

## 2016-08-09 DIAGNOSIS — R1013 Epigastric pain: Secondary | ICD-10-CM

## 2016-08-09 DIAGNOSIS — I10 Essential (primary) hypertension: Secondary | ICD-10-CM

## 2016-08-09 MED ORDER — LISINOPRIL 20 MG PO TABS
20.0000 mg | ORAL_TABLET | Freq: Every day | ORAL | 3 refills | Status: DC
Start: 2016-08-09 — End: 2017-09-15

## 2016-08-09 NOTE — Progress Notes (Signed)
[facility] PROGRESS NOTE    I had the pleasure of seeing Emily [name] today for cardiovascular follow up. She is a pleasant 70 y.o. female with a history of palpitations and a normal stress echo March 2016 who presents with occasional few seconds palpitation unchanged from previous without near syncope and episodes of left or right sided chest sensation also for a few seconds at a time who presents for follow up.          MEDICATIONS:  Current Outpatient Prescriptions   Medication Sig Dispense Refill   . anastrozole (ARIMIDEX) 1 MG tablet TAKE ONE TABLET BY MOUTH ONCE DAILY 90 tablet 2   . aspirin 81 MG EC tablet Take 81 mg by mouth daily.     Marland Kitchen gabapentin (NEURONTIN) 100 MG capsule Take 1 capsule (100 mg total) by mouth 3 (three) times daily. 60 capsule 0   . HYDROcodone-acetaminophen (NORCO) 5-325 MG per tablet Take 1-2 tablets by mouth every 4 (four) hours as needed for Pain. 10 tablet 0   . levothyroxine (SYNTHROID, LEVOTHROID) 25 MCG tablet Take 25 mcg by mouth daily.     Marland Kitchen lisinopril (PRINIVIL,ZESTRIL) 20 MG tablet Take 1 tablet (20 mg total) by mouth daily. 30 tablet 3   . omeprazole (PRILOSEC) 10 MG capsule Take 2 capsules (20 mg total) by mouth daily. 90 capsule 3   . simvastatin (ZOCOR) 40 MG tablet Take 40 mg by mouth daily.           REVIEW OF SYSTEMS: All other systems reviewed and negative except as above.    PHYSICAL EXAMINATION  General Appearance: well-appearing and in no acute distress.   Vital Signs: BP 159/85   Pulse (!) 59   Ht 1.6 m (5\' 3" )   Wt 75.3 kg (166 lb)   BMI 29.41 kg/m  Repeat 160/80    Chest: Clear to auscultation bilaterally with good air movement and respiratory effort and no wheezes, rales, or rhonchi  Cardiovascular: Normal S1 and  S2 without murmurs, gallops or rub. PMI of normal size and nondisplaced.     Extremities: Warm without edema.  Neuro: Alert and oriented x3. Grossly intact. Strength is symmetrical. Normal mood and affect.     ECG: nsr, nssttwc I have  personally reviewed and interpreted the EKG/Rhythm.    Past Medical History:   Diagnosis Date   . Breast cancer 2014    right breast mastectomy   . Hyperlipidemia    . Hypertension    . Malignant neoplasm of overlapping sites of right female breast 10/19/2015     Family History   Problem Relation Age of Onset   . Breast cancer Neg Hx      Social History     Social History   . Marital status: Widowed     Spouse name: N/A   . Number of children: N/A   . Years of education: N/A     Social History Main Topics   . Smoking status: Never Smoker   . Smokeless tobacco: Never Used   . Alcohol use Yes      Comment: socially   . Drug use: Unknown   . Sexual activity: Not Asked     Other Topics Concern   . None     Social History Narrative   . None         IMPRESSION:  Hypertension not controlled and pt is only on 10 mg lisinopril   Non cardiac chest discomfort and no clinically  significant palpitations      PLAN:   Increase lisinopril to 20 daily, blood pressure was ok on 20 mg daily  6 mos       Royann Shivers, MD   08/09/2016

## 2016-08-10 ENCOUNTER — Encounter (INDEPENDENT_AMBULATORY_CARE_PROVIDER_SITE_OTHER): Payer: Self-pay | Admitting: Cardiology

## 2016-08-15 ENCOUNTER — Other Ambulatory Visit: Payer: Self-pay

## 2016-08-15 ENCOUNTER — Other Ambulatory Visit (INDEPENDENT_AMBULATORY_CARE_PROVIDER_SITE_OTHER): Payer: Self-pay | Admitting: Physician Assistant

## 2016-08-15 DIAGNOSIS — Z9011 Acquired absence of right breast and nipple: Secondary | ICD-10-CM

## 2016-08-15 DIAGNOSIS — N644 Mastodynia: Secondary | ICD-10-CM

## 2016-08-17 ENCOUNTER — Ambulatory Visit
Admission: RE | Admit: 2016-08-17 | Discharge: 2016-08-17 | Disposition: A | Discharge status: Home / Self Care | Payer: Medicare Other | Source: Ambulatory Visit | Attending: Internal Medicine | Admitting: Internal Medicine

## 2016-08-17 DIAGNOSIS — Z9011 Acquired absence of right breast and nipple: Secondary | ICD-10-CM | POA: Insufficient documentation

## 2016-08-17 DIAGNOSIS — N644 Mastodynia: Secondary | ICD-10-CM | POA: Insufficient documentation

## 2016-09-12 ENCOUNTER — Emergency Department
Admission: EM | Admit: 2016-09-12 | Discharge: 2016-09-12 | Disposition: A | Discharge status: Home / Self Care | Payer: Medicare Other | Attending: Emergency Medicine | Admitting: Emergency Medicine

## 2016-09-12 ENCOUNTER — Emergency Department: Payer: Medicare Other

## 2016-09-12 DIAGNOSIS — Z853 Personal history of malignant neoplasm of breast: Secondary | ICD-10-CM | POA: Insufficient documentation

## 2016-09-12 DIAGNOSIS — M546 Pain in thoracic spine: Secondary | ICD-10-CM | POA: Insufficient documentation

## 2016-09-12 DIAGNOSIS — R0602 Shortness of breath: Secondary | ICD-10-CM | POA: Insufficient documentation

## 2016-09-12 DIAGNOSIS — E785 Hyperlipidemia, unspecified: Secondary | ICD-10-CM | POA: Insufficient documentation

## 2016-09-12 DIAGNOSIS — Z7982 Long term (current) use of aspirin: Secondary | ICD-10-CM | POA: Insufficient documentation

## 2016-09-12 DIAGNOSIS — I10 Essential (primary) hypertension: Secondary | ICD-10-CM | POA: Insufficient documentation

## 2016-09-12 DIAGNOSIS — Z79899 Other long term (current) drug therapy: Secondary | ICD-10-CM | POA: Insufficient documentation

## 2016-09-12 LAB — CBC AND DIFFERENTIAL
Absolute NRBC: 0 10*3/uL
Basophils Absolute Automated: 0.04 10*3/uL (ref 0.00–0.20)
Basophils Automated: 0.6 %
Eosinophils Absolute Automated: 0.04 10*3/uL (ref 0.00–0.70)
Eosinophils Automated: 0.6 %
Hematocrit: 41.6 % (ref 37.0–47.0)
Hgb: 12.7 g/dL (ref 12.0–16.0)
Immature Granulocytes Absolute: 0.01 10*3/uL
Immature Granulocytes: 0.2 %
Lymphocytes Absolute Automated: 2.27 10*3/uL (ref 0.50–4.40)
Lymphocytes Automated: 36.1 %
MCH: 26.8 pg — ABNORMAL LOW (ref 28.0–32.0)
MCHC: 30.5 g/dL — ABNORMAL LOW (ref 32.0–36.0)
MCV: 87.8 fL (ref 80.0–100.0)
MPV: 11.7 fL (ref 9.4–12.3)
Monocytes Absolute Automated: 0.43 10*3/uL (ref 0.00–1.20)
Monocytes: 6.8 %
Neutrophils Absolute: 3.49 10*3/uL (ref 1.80–8.10)
Neutrophils: 55.7 %
Nucleated RBC: 0 /100 WBC (ref 0.0–1.0)
Platelets: 367 10*3/uL (ref 140–400)
RBC: 4.74 10*6/uL (ref 4.20–5.40)
RDW: 13 % (ref 12–15)
WBC: 6.28 10*3/uL (ref 3.50–10.80)

## 2016-09-12 LAB — PT AND APTT
PT INR: 0.9 (ref 0.9–1.1)
PT: 12.4 s — ABNORMAL LOW (ref 12.6–15.0)
PTT: 34 s (ref 23–37)

## 2016-09-12 LAB — COMPREHENSIVE METABOLIC PANEL
ALT: 12 U/L (ref 0–55)
AST (SGOT): 20 U/L (ref 5–34)
Albumin/Globulin Ratio: 1.4 (ref 0.9–2.2)
Albumin: 4.1 g/dL (ref 3.5–5.0)
Alkaline Phosphatase: 90 U/L (ref 37–106)
Anion Gap: 9 (ref 5.0–15.0)
BUN: 13 mg/dL (ref 7–19)
Bilirubin, Total: 0.3 mg/dL (ref 0.2–1.2)
CO2: 29 mEq/L (ref 22–29)
Calcium: 9.6 mg/dL (ref 7.9–10.2)
Chloride: 104 mEq/L (ref 100–111)
Creatinine: 0.8 mg/dL (ref 0.6–1.0)
Globulin: 2.9 g/dL (ref 2.0–3.6)
Glucose: 112 mg/dL — ABNORMAL HIGH (ref 70–100)
Potassium: 4.1 mEq/L (ref 3.5–5.1)
Protein, Total: 7 g/dL (ref 6.0–8.3)
Sodium: 142 mEq/L (ref 136–145)

## 2016-09-12 LAB — IHS D-DIMER: D-Dimer: 0.37 ug/mL FEU (ref 0.00–0.51)

## 2016-09-12 LAB — ECG 12-LEAD
Atrial Rate: 58 {beats}/min
P Axis: 45 degrees
P-R Interval: 164 ms
Q-T Interval: 402 ms
QRS Duration: 92 ms
QTC Calculation (Bezet): 394 ms
R Axis: 37 degrees
T Axis: 2 degrees
Ventricular Rate: 58 {beats}/min

## 2016-09-12 LAB — TROPONIN I: Troponin I: <0.01 ng/mL (ref 0.00–0.09)

## 2016-09-12 LAB — GFR: EGFR: >60

## 2016-09-12 MED ORDER — CYCLOBENZAPRINE HCL 10 MG PO TABS
10.0000 mg | ORAL_TABLET | Freq: Once | ORAL | Status: AC
Start: 2016-09-12 — End: 2016-09-12
  Administered 2016-09-12: 10 mg via ORAL
  Filled 2016-09-12: qty 1

## 2016-09-12 MED ORDER — KETOROLAC TROMETHAMINE 30 MG/ML IJ SOLN
15.0000 mg | Freq: Once | INTRAMUSCULAR | Status: AC
Start: 2016-09-12 — End: 2016-09-12
  Administered 2016-09-12: 15 mg via INTRAVENOUS
  Filled 2016-09-12: qty 1

## 2016-09-12 MED ORDER — IBUPROFEN 600 MG PO TABS
600.0000 mg | ORAL_TABLET | Freq: Four times a day (QID) | ORAL | 0 refills | Status: DC | PRN
Start: 2016-09-12 — End: 2018-05-14

## 2016-09-12 MED ORDER — CYCLOBENZAPRINE HCL 10 MG PO TABS
10.0000 mg | ORAL_TABLET | Freq: Three times a day (TID) | ORAL | 0 refills | Status: AC | PRN
Start: 2016-09-12 — End: 2016-09-27

## 2016-09-12 NOTE — ED Notes (Signed)
MD aware of pt HR. 

## 2016-09-12 NOTE — Discharge Instructions (Signed)
Shortness of Breath, Unclear Etiology    You have been seen today for shortness of breath, or difficulty breathing; but we don't know the cause.    This means that after talking about your medical problems, doing a physical exam, and looking at the tests that were done, there is still not a clear explanation as to why you are short of breath. You may need to go see your doctor for another exam or more tests to find out why you are short of breath. .    CAUTION: Even after a workup, including EKGs, lab tests, x-rays and even CAT Scans (CT scans), it is still possible to have a serious problem that cannot be detected at first.    The doctor caring for you feels that the cause of your shortness of breath is not from a life-threatening problem.    Some of the more dangerous medical problems such as a heart attack, blood clot in the lung (pulmonary embolism), asthma, collapsed lung, heart failure, etc. have been looked at, but are not felt to be the cause of your shortness of breath.    Shortness of breath is a serious symptom and must be handled carefully. It is VERY IMPORTANT that you see your regular doctor and return for re-evaluation or seek medical attention immediately if your symptoms become worse or change.    YOU SHOULD SEEK MEDICAL ATTENTION IMMEDIATELY, EITHER HERE OR AT THE NEAREST EMERGENCY DEPARTMENT, IF ANY OF THE FOLLOWING OCCURS:   Your shortness of breath returns.   You notice that your shortness of breath happens as you walk, go up stairs, or exert yourself.   If you develop chest pain, sweating, or nausea (feel sick to your stomach).   Rapid heartbeat.   You have any weakness, lightheadedness or you get so short of breath that you pass out.   It hurts to breathe.   Your leg swells.   Any other worsening symptoms or problems.   If you are unable to sleep because of shortness of breath, or you wake in the middle of the night short of breath.

## 2016-09-14 NOTE — ED Provider Notes (Signed)
EMERGENCY DEPARTMENT NOTE    Physician/Midlevel provider first contact with patient: 09/12/16 1308         HISTORY OF PRESENT ILLNESS   Historian:Patient  Translator Used: No    Chief Complaint: Shortness of Breath     Mechanism of Injury:       70 y.o. female presents to the ED complaining of SOB and R shoulder pain.  Patient reports h/o breast cancer in 2014.  Denies chest pain.  Pain sharp with deep breath and worse with movement.  No nausea or vomiting.  No cough or fever.    1. Location of symptoms: R upper back   2. Onset of symptoms: this morning  3. What was patient doing when symptoms started (Context): see above  4. Severity: moderate  5. Timing: constant  6. Activities that worsen symptoms: movement, breathing  7. Activities that improve symptoms: nothing  8. Quality: sharp  9. Radiation of symptoms: no  10. Associated signs and Symptoms: see above  11. Are symptoms worsening? yes  MEDICAL HISTORY     Past Medical History:  Past Medical History:   Diagnosis Date   . Breast cancer 2014    right breast mastectomy   . Hyperlipidemia    . Hypertension    . Malignant neoplasm of overlapping sites of right female breast 10/19/2015       Past Surgical History:  Past Surgical History:   Procedure Laterality Date   . HYSTERECTOMY     . MASTECTOMY Right 2015       Social History:  Social History     Social History   . Marital status: Widowed     Spouse name: N/A   . Number of children: N/A   . Years of education: N/A     Occupational History   . Not on file.     Social History Main Topics   . Smoking status: Never Smoker   . Smokeless tobacco: Never Used   . Alcohol use Yes      Comment: socially   . Drug use: Unknown   . Sexual activity: Not on file     Other Topics Concern   . Not on file     Social History Narrative   . No narrative on file       Family History:  Family History   Problem Relation Age of Onset   . Breast cancer Neg Hx        Outpatient Medication:  Discharge Medication List as of 09/12/2016  3:26  PM      CONTINUE these medications which have NOT CHANGED    Details   anastrozole (ARIMIDEX) 1 MG tablet TAKE ONE TABLET BY MOUTH ONCE DAILY, Normal      aspirin 81 MG EC tablet Take 81 mg by mouth daily., Until Discontinued, Historical Med      gabapentin (NEURONTIN) 100 MG capsule Take 1 capsule (100 mg total) by mouth 3 (three) times daily., Starting Sun 07/24/2016, Print      HYDROcodone-acetaminophen (NORCO) 5-325 MG per tablet Take 1-2 tablets by mouth every 4 (four) hours as needed for Pain., Starting 04/23/2016, Until Discontinued, Print      levothyroxine (SYNTHROID, LEVOTHROID) 25 MCG tablet Take 25 mcg by mouth daily., Starting 12/25/2013, Until Discontinued, Historical Med      lisinopril (PRINIVIL,ZESTRIL) 20 MG tablet Take 1 tablet (20 mg total) by mouth daily., Starting Tue 08/09/2016, Normal      omeprazole (PRILOSEC) 10 MG capsule Take  2 capsules (20 mg total) by mouth daily., Starting 12/17/2014, Until Discontinued, Normal      simvastatin (ZOCOR) 40 MG tablet Take 40 mg by mouth daily., Starting 04/08/2014, Until Discontinued, Historical Med               REVIEW OF SYSTEMS   Review of Systems   Constitutional: Negative.  Negative for chills and fever.   Respiratory: Positive for shortness of breath.    Cardiovascular: Negative.  Negative for chest pain and leg swelling.   Musculoskeletal: Positive for back pain.   All other systems reviewed and are negative.         PHYSICAL EXAM     ED Triage Vitals   Enc Vitals Group      BP 09/12/16 1238 169/80      Heart Rate 09/12/16 1238 69      Resp Rate 09/12/16 1459 16      Temp 09/12/16 1238 98.1 F (36.7 C)      Temp Source 09/12/16 1238 Oral      SpO2 09/12/16 1238 96 %      Weight 09/12/16 1238 75 kg      Height 09/12/16 1238 1.6 m      Head Circumference --       Peak Flow --       Pain Score 09/12/16 1238 0      Pain Loc --       Pain Edu? --       Excl. in GC? --      Physical Exam   Constitutional: She is oriented to person, place, and time. She  appears well-developed and well-nourished. No distress.   HENT:   Head: Normocephalic and atraumatic.   Eyes: Conjunctivae are normal. Pupils are equal, round, and reactive to light.   Neck: Normal range of motion. Neck supple.   Cardiovascular: Normal rate, regular rhythm and normal heart sounds.    Pulmonary/Chest: Effort normal and breath sounds normal.           Abdominal: Soft. There is no tenderness. There is no rebound and no guarding.   Musculoskeletal: She exhibits no edema or tenderness.   Neurological: She is alert and oriented to person, place, and time.   Skin: Skin is warm and dry. Capillary refill takes less than 2 seconds.   Nursing note and vitals reviewed.      MEDICAL DECISION MAKING     DISCUSSION    Patient with a h/o breast cancer and SOB with reproducible upper back pain.  D-dimer negative.  Normal CXR.  Labs with no anemia or evidence of ACS.  Likely musculoskeletal.  Given toradol and flexeril.  Follow-up instructions and return precautions given.    Vital Signs: Reviewed the patient?s vital signs.   Nursing Notes: Reviewed and utilized available nursing notes.  Medical Records Reviewed: Reviewed available past medical records.  Counseling: The emergency provider has spoken with the patient and discussed today?s findings, in addition to providing specific details for the plan of care.  Questions are answered and there is agreement with the plan.      CARDIAC STUDIES    The following cardiac studies were independently interpreted by the Emergency Medicine Physician.  For full cardiac study results please see chart.    Monitor Strip  Interpreted by ED Physician  Rate: 51  Rhythm: sinus bradycardia  ST Changes: none    EKG Interpretation:  Signed and interpreted byED Physician   Time Interpreted: 1250  Rate: 58  Rhythm: sinus bradycardia  Axis: normal  Intervals: normal  Blocks: none  ST segments: normal   Interpretation: Normal EKG      RADIOLOGY IMAGING STUDIES      Chest 2 Views   Final  Result         No acute focal pulmonary process      Stable mild cardiomegaly      Max Fickle, MD    09/12/2016 1:27 PM                 PULSE OXIMETRY    Oxygen Saturation by Pulse Oximetry: 99%  Interventions: none  Interpretation:  Normal     EMERGENCY DEPT. MEDICATIONS      ED Medication Orders     Start Ordered     Status Ordering Provider    09/12/16 1507 09/12/16 1506  cyclobenzaprine (FLEXERIL) tablet 10 mg  Once     Route: Oral  Ordered Dose: 10 mg     Last MAR action:  Given Larina Bras    09/12/16 1439 09/12/16 1438  ketorolac (TORADOL) injection 15 mg  Once     Route: Intravenous  Ordered Dose: 15 mg     Last MAR action:  Given Ivery Nanney KAEHLER          LABORATORY RESULTS    Ordered and independently interpreted AVAILABLE laboratory tests. Please see results section in chart for full details.  Results for orders placed or performed during the hospital encounter of 09/12/16   Troponin I   Result Value Ref Range    Troponin I <0.01 0.00 - 0.09 ng/mL   CBC with differential   Result Value Ref Range    WBC 6.28 3.50 - 10.80 x10 3/uL    Hgb 12.7 12.0 - 16.0 g/dL    Hematocrit 16.1 09.6 - 47.0 %    Platelets 367 140 - 400 x10 3/uL    RBC 4.74 4.20 - 5.40 x10 6/uL    MCV 87.8 80.0 - 100.0 fL    MCH 26.8 (L) 28.0 - 32.0 pg    MCHC 30.5 (L) 32.0 - 36.0 g/dL    RDW 13 12 - 15 %    MPV 11.7 9.4 - 12.3 fL    Neutrophils 55.7 None %    Lymphocytes Automated 36.1 None %    Monocytes 6.8 None %    Eosinophils Automated 0.6 None %    Basophils Automated 0.6 None %    Immature Granulocyte 0.2 None %    Nucleated RBC 0.0 0.0 - 1.0 /100 WBC    Neutrophils Absolute 3.49 1.80 - 8.10 x10 3/uL    Abs Lymph Automated 2.27 0.50 - 4.40 x10 3/uL    Abs Mono Automated 0.43 0.00 - 1.20 x10 3/uL    Abs Eos Automated 0.04 0.00 - 0.70 x10 3/uL    Absolute Baso Automated 0.04 0.00 - 0.20 x10 3/uL    Absolute Immature Granulocyte 0.01 0 x10 3/uL    Absolute NRBC 0.00 0 x10 3/uL   Comprehensive Metabolic  Panel (CMP)   Result Value Ref Range    Glucose 112 (H) 70 - 100 mg/dL    BUN 13 7 - 19 mg/dL    Creatinine 0.8 0.6 - 1.0 mg/dL    Sodium 045 409 - 811 mEq/L    Potassium 4.1 3.5 - 5.1 mEq/L    Chloride 104 100 - 111 mEq/L    CO2 29 22 - 29 mEq/L  Calcium 9.6 7.9 - 10.2 mg/dL    Protein, Total 7.0 6.0 - 8.3 g/dL    Albumin 4.1 3.5 - 5.0 g/dL    AST (SGOT) 20 5 - 34 U/L    ALT 12 0 - 55 U/L    Alkaline Phosphatase 90 37 - 106 U/L    Bilirubin, Total 0.3 0.2 - 1.2 mg/dL    Globulin 2.9 2.0 - 3.6 g/dL    Albumin/Globulin Ratio 1.4 0.9 - 2.2    Anion Gap 9.0 5.0 - 15.0   GFR   Result Value Ref Range    EGFR >60.0    D-Dimer   Result Value Ref Range    D-Dimer 0.37 0.00 - 0.51 ug/mL FEU   PT/APTT   Result Value Ref Range    PT 12.4 (L) 12.6 - 15.0 sec    PT INR 0.9 0.9 - 1.1    PT Anticoag. Given Within 48 hrs. None     PTT 34 23 - 37 sec   ECG 12 lead   Result Value Ref Range    Ventricular Rate 58 BPM    Atrial Rate 58 BPM    P-R Interval 164 ms    QRS Duration 92 ms    Q-T Interval 402 ms    QTC Calculation (Bezet) 394 ms    P Axis 45 degrees    R Axis 37 degrees    T Axis 2 degrees       CRITICAL CARE/PROCEDURES    Procedures    DIAGNOSIS      Diagnosis:  Final diagnoses:   Acute right-sided thoracic back pain   Shortness of breath       Disposition:  ED Disposition     ED Disposition Condition Date/Time Comment    Discharge  Mon Sep 12, 2016  3:26 PM Emily [name] discharge to home/self care.    Condition at disposition: Stable          Prescriptions:  Discharge Medication List as of 09/12/2016  3:26 PM      START taking these medications    Details   cyclobenzaprine (FLEXERIL) 10 MG tablet Take 1 tablet (10 mg total) by mouth 3 (three) times daily as needed for Muscle spasms.for up to 15 days, Starting Mon 09/12/2016, Until Tue 09/27/2016, Normal      ibuprofen (ADVIL,MOTRIN) 600 MG tablet Take 1 tablet (600 mg total) by mouth every 6 (six) hours as needed for Pain.for up to 30 doses, Starting Mon 09/12/2016,  Normal         CONTINUE these medications which have NOT CHANGED    Details   anastrozole (ARIMIDEX) 1 MG tablet TAKE ONE TABLET BY MOUTH ONCE DAILY, Normal      aspirin 81 MG EC tablet Take 81 mg by mouth daily., Until Discontinued, Historical Med      gabapentin (NEURONTIN) 100 MG capsule Take 1 capsule (100 mg total) by mouth 3 (three) times daily., Starting Sun 07/24/2016, Print      HYDROcodone-acetaminophen (NORCO) 5-325 MG per tablet Take 1-2 tablets by mouth every 4 (four) hours as needed for Pain., Starting 04/23/2016, Until Discontinued, Print      levothyroxine (SYNTHROID, LEVOTHROID) 25 MCG tablet Take 25 mcg by mouth daily., Starting 12/25/2013, Until Discontinued, Historical Med      lisinopril (PRINIVIL,ZESTRIL) 20 MG tablet Take 1 tablet (20 mg total) by mouth daily., Starting Tue 08/09/2016, Normal      omeprazole (PRILOSEC) 10 MG capsule Take 2  capsules (20 mg total) by mouth daily., Starting 12/17/2014, Until Discontinued, Normal      simvastatin (ZOCOR) 40 MG tablet Take 40 mg by mouth daily., Starting 04/08/2014, Until Discontinued, Historical Med                  Larina Bras, MD  09/14/16 2221

## 2016-09-28 ENCOUNTER — Encounter (INDEPENDENT_AMBULATORY_CARE_PROVIDER_SITE_OTHER): Payer: Self-pay | Admitting: Internal Medicine

## 2016-09-28 ENCOUNTER — Ambulatory Visit (INDEPENDENT_AMBULATORY_CARE_PROVIDER_SITE_OTHER): Payer: Medicare Other | Admitting: Internal Medicine

## 2016-09-28 VITALS — BP 140/80 | HR 55 | Resp 16 | Ht 63.0 in | Wt 167.0 lb

## 2016-09-28 DIAGNOSIS — I10 Essential (primary) hypertension: Secondary | ICD-10-CM

## 2016-09-28 NOTE — Progress Notes (Signed)
[facility] OFFICE VISIT      Chief Complaint   Patient presents with   . Irregular Heart Beat   . Hypertension       I had the pleasure of seeing Emily [name] today for cardiovascular follow up. She is a pleasant 70 y.o. female with a history of HTN who presents for continued management.  Seem in the ER for SOB and back pain.  Was moving furniture and may have hurt her back. No other symptoms      MEDICATIONS:     Current Outpatient Prescriptions   Medication Sig Dispense Refill   . anastrozole (ARIMIDEX) 1 MG tablet TAKE ONE TABLET BY MOUTH ONCE DAILY 90 tablet 2   . aspirin 81 MG EC tablet Take 81 mg by mouth daily.     Marland Kitchen gabapentin (NEURONTIN) 100 MG capsule Take 1 capsule (100 mg total) by mouth 3 (three) times daily. 60 capsule 0   . HYDROcodone-acetaminophen (NORCO) 5-325 MG per tablet Take 1-2 tablets by mouth every 4 (four) hours as needed for Pain. 10 tablet 0   . ibuprofen (ADVIL,MOTRIN) 600 MG tablet Take 1 tablet (600 mg total) by mouth every 6 (six) hours as needed for Pain.for up to 30 doses 30 tablet 0   . levothyroxine (SYNTHROID, LEVOTHROID) 25 MCG tablet Take 25 mcg by mouth daily.     Marland Kitchen lisinopril (PRINIVIL,ZESTRIL) 20 MG tablet Take 1 tablet (20 mg total) by mouth daily. 90 tablet 3   . omeprazole (PRILOSEC) 10 MG capsule Take 2 capsules (20 mg total) by mouth daily. 90 capsule 3   . simvastatin (ZOCOR) 40 MG tablet Take 40 mg by mouth daily.       No current facility-administered medications for this visit.        REVIEW OF SYSTEMS: All other systems reviewed and negative except as above.    PHYSICAL EXAMINATION  Vital Signs: BP 150/80   Pulse (!) 55   Resp 16   Ht 1.6 m (5\' 3" )   Wt 75.8 kg (167 lb)   BMI 29.58 kg/m    Vital signs reviewed    Wt Readings from Last 3 Encounters:   09/28/16 75.8 kg (167 lb)   09/12/16 75 kg (165 lb 5.5 oz)   08/09/16 75.3 kg (166 lb)        General Appearance:  A well-appearing female in no acute distress.    HEENT: Sclera anicteric, conjunctiva  without pallor, moist mucous membranes, normal dentition.   Neck:  Supple without jugular venous distention.  Normal carotid upstrokes without bruits.   Chest: Clear to auscultation bilaterally with good air movement and respiratory effort and no wheezes, rales, or rhonchi   Cardiac: RRR.  Normal S1 and physiologically split S2, without gallops or rub. No murmurs.  PMI of normal size and nondisplaced.   Vascular:  2+ carotid, radial, and distal pulses bilaterally  Abdomen: Soft, nontender, nondistended, with normoactive bowel sounds.  No pulsatile masses, or bruits.   Extremities: Warm without edema, clubbing, or cyanosis.   Skin: No rash, warm, appropriate for race.   Neuro: Alert and oriented x3. Grossly intact.  CN II-XII intact.  Normal mood and affect.     ECG:   Independent review shows: Sinus Bradycardia    ASSESSMENT/PLAN:  Essential Hypertension  Asymptomatic Sinus Bradycardia  Current URI    Plan    Reassurance  RTC 3 months Chrisandra Netters, MD  09/28/2016

## 2016-11-10 ENCOUNTER — Other Ambulatory Visit: Payer: Self-pay | Admitting: Internal Medicine

## 2016-11-25 ENCOUNTER — Ambulatory Visit (INDEPENDENT_AMBULATORY_CARE_PROVIDER_SITE_OTHER): Payer: Medicare Other | Admitting: Cardiology

## 2016-11-25 ENCOUNTER — Encounter (INDEPENDENT_AMBULATORY_CARE_PROVIDER_SITE_OTHER): Payer: Self-pay | Admitting: Cardiology

## 2016-11-25 VITALS — BP 154/78 | HR 56 | Ht 63.0 in | Wt 161.0 lb

## 2016-11-25 DIAGNOSIS — I10 Essential (primary) hypertension: Secondary | ICD-10-CM

## 2016-11-25 NOTE — Progress Notes (Signed)
[name]    I had the pleasure of seeing Emily [name] today for cardiovascular follow up. She is a pleasant 70 y.o. female with a history of previous palpitations and no dx of significant arrhythmia who presents with a few seconds of palpitation that occurs twice a week.   No chest pain and no sob.          MEDICATIONS:  Current Outpatient Prescriptions   Medication Sig Dispense Refill   . anastrozole (ARIMIDEX) 1 MG tablet TAKE ONE TABLET BY MOUTH ONCE DAILY 90 tablet 2   . aspirin 81 MG EC tablet Take 81 mg by mouth daily.     Marland Kitchen gabapentin (NEURONTIN) 100 MG capsule Take 1 capsule (100 mg total) by mouth 3 (three) times daily. 60 capsule 0   . HYDROcodone-acetaminophen (NORCO) 5-325 MG per tablet Take 1-2 tablets by mouth every 4 (four) hours as needed for Pain. 10 tablet 0   . ibuprofen (ADVIL,MOTRIN) 600 MG tablet Take 1 tablet (600 mg total) by mouth every 6 (six) hours as needed for Pain.for up to 30 doses 30 tablet 0   . levothyroxine (SYNTHROID, LEVOTHROID) 25 MCG tablet Take 25 mcg by mouth daily.     Marland Kitchen lisinopril (PRINIVIL,ZESTRIL) 20 MG tablet Take 1 tablet (20 mg total) by mouth daily. 90 tablet 3   . omeprazole (PRILOSEC) 10 MG capsule Take 2 capsules (20 mg total) by mouth daily. 90 capsule 3   . simvastatin (ZOCOR) 40 MG tablet Take 40 mg by mouth daily.           REVIEW OF SYSTEMS: All other systems reviewed and negative except as above.    PHYSICAL EXAMINATION   General Appearance: well-appearing and in no acute distress.   Vital Signs: 130/70. Wt 73 kg , ht 36foot 3 in  Chest: Clear to auscultation bilaterally with good air movement and respiratory effort and no wheezes, rales, or rhonchi  Cardiovascular: Normal S1 and  S2 without murmurs, gallops or rub. PMI of normal size and nondisplaced.   Extremities: Warm without edema.   Neuro: Alert and oriented x3. Grossly intact. Strength is symmetrical. Normal mood and affect.     ECG: nsr, nssttwc I have personally reviewed and  interpreted the EKG/Rhythm.    Past Medical History:   Diagnosis Date   . Breast cancer 2014    right breast mastectomy   . Hyperlipidemia    . Hypertension    . Malignant neoplasm of overlapping sites of right female breast 10/19/2015     Family History   Problem Relation Age of Onset   . Breast cancer Neg Hx      Social History     Social History   . Marital status: Widowed     Spouse name: N/A   . Number of children: N/A   . Years of education: N/A     Social History Main Topics   . Smoking status: Never Smoker   . Smokeless tobacco: Never Used   . Alcohol use Yes      Comment: socially   . Drug use: Unknown   . Sexual activity: Not Asked     Other Topics Concern   . None     Social History Narrative   . None         IMPRESSION:  Palpitations that last a few seconds without near syncope or chest pain, no further evaluation required.  Hypertension controlled      PLAN:  Same medications  1 year follow up.       Royann Shivers, MD   11/25/2016

## 2016-11-28 ENCOUNTER — Encounter (INDEPENDENT_AMBULATORY_CARE_PROVIDER_SITE_OTHER): Payer: Self-pay | Admitting: Cardiology

## 2016-12-01 ENCOUNTER — Other Ambulatory Visit: Payer: Self-pay | Admitting: Surgery

## 2016-12-01 DIAGNOSIS — Z853 Personal history of malignant neoplasm of breast: Secondary | ICD-10-CM

## 2016-12-05 ENCOUNTER — Ambulatory Visit
Admission: RE | Admit: 2016-12-05 | Discharge: 2016-12-05 | Disposition: A | Discharge status: Home / Self Care | Payer: Medicare Other | Source: Ambulatory Visit | Attending: Surgery | Admitting: Surgery

## 2016-12-05 DIAGNOSIS — R0789 Other chest pain: Secondary | ICD-10-CM | POA: Insufficient documentation

## 2016-12-05 DIAGNOSIS — Z853 Personal history of malignant neoplasm of breast: Secondary | ICD-10-CM

## 2016-12-05 DIAGNOSIS — J948 Other specified pleural conditions: Secondary | ICD-10-CM | POA: Insufficient documentation

## 2016-12-05 DIAGNOSIS — E041 Nontoxic single thyroid nodule: Secondary | ICD-10-CM | POA: Insufficient documentation

## 2016-12-05 DIAGNOSIS — N281 Cyst of kidney, acquired: Secondary | ICD-10-CM | POA: Insufficient documentation

## 2016-12-05 DIAGNOSIS — J8410 Pulmonary fibrosis, unspecified: Secondary | ICD-10-CM | POA: Insufficient documentation

## 2016-12-05 DIAGNOSIS — M898X0 Other specified disorders of bone, multiple sites: Secondary | ICD-10-CM | POA: Insufficient documentation

## 2016-12-05 LAB — WHOLE BLOOD CREATININE WITH GFR POCT
GFR POCT: >60 mL/min/{1.73_m2} (ref >60)
Whole Blood Creatinine POCT: 0.6 mg/dL (ref 0.5–1.1)

## 2016-12-05 MED ORDER — TECHNETIUM TC 99M OXIDRONATE INJECTION
25.0000 | Freq: Once | Status: AC | PRN
Start: 2016-12-05 — End: 2016-12-05
  Administered 2016-12-05: 25 via INTRAVENOUS

## 2016-12-05 MED ORDER — IOHEXOL 350 MG/ML IV SOLN
100.0000 mL | Freq: Once | INTRAVENOUS | Status: AC | PRN
Start: 2016-12-05 — End: 2016-12-05
  Administered 2016-12-05: 100 mL via INTRAVENOUS

## 2017-01-18 ENCOUNTER — Emergency Department
Admission: EM | Admit: 2017-01-18 | Discharge: 2017-01-18 | Disposition: A | Discharge status: Home / Self Care | Payer: Medicare Other | Attending: Emergency Medicine | Admitting: Emergency Medicine

## 2017-01-18 ENCOUNTER — Emergency Department: Payer: Medicare Other

## 2017-01-18 DIAGNOSIS — Z79899 Other long term (current) drug therapy: Secondary | ICD-10-CM | POA: Insufficient documentation

## 2017-01-18 DIAGNOSIS — R002 Palpitations: Secondary | ICD-10-CM | POA: Insufficient documentation

## 2017-01-18 DIAGNOSIS — R0602 Shortness of breath: Secondary | ICD-10-CM | POA: Insufficient documentation

## 2017-01-18 DIAGNOSIS — Z7982 Long term (current) use of aspirin: Secondary | ICD-10-CM | POA: Insufficient documentation

## 2017-01-18 DIAGNOSIS — Z853 Personal history of malignant neoplasm of breast: Secondary | ICD-10-CM | POA: Insufficient documentation

## 2017-01-18 DIAGNOSIS — I10 Essential (primary) hypertension: Secondary | ICD-10-CM | POA: Insufficient documentation

## 2017-01-18 DIAGNOSIS — E785 Hyperlipidemia, unspecified: Secondary | ICD-10-CM | POA: Insufficient documentation

## 2017-01-18 LAB — CBC AND DIFFERENTIAL
Absolute NRBC: 0 10*3/uL
Basophils Absolute Automated: 0.03 10*3/uL (ref 0.00–0.20)
Basophils Automated: 0.5 %
Eosinophils Absolute Automated: 0.07 10*3/uL (ref 0.00–0.70)
Eosinophils Automated: 1.1 %
Hematocrit: 40.3 % (ref 37.0–47.0)
Hgb: 12.6 g/dL (ref 12.0–16.0)
Immature Granulocytes Absolute: 0.01 10*3/uL
Immature Granulocytes: 0.2 %
Lymphocytes Absolute Automated: 2.18 10*3/uL (ref 0.50–4.40)
Lymphocytes Automated: 34.6 %
MCH: 27 pg — ABNORMAL LOW (ref 28.0–32.0)
MCHC: 31.3 g/dL — ABNORMAL LOW (ref 32.0–36.0)
MCV: 86.5 fL (ref 80.0–100.0)
MPV: 11.4 fL (ref 9.4–12.3)
Monocytes Absolute Automated: 0.43 10*3/uL (ref 0.00–1.20)
Monocytes: 6.8 %
Neutrophils Absolute: 3.58 10*3/uL (ref 1.80–8.10)
Neutrophils: 56.8 %
Nucleated RBC: 0 /100 WBC (ref 0.0–1.0)
Platelets: 324 10*3/uL (ref 140–400)
RBC: 4.66 10*6/uL (ref 4.20–5.40)
RDW: 13 % (ref 12–15)
WBC: 6.3 10*3/uL (ref 3.50–10.80)

## 2017-01-18 LAB — COMPREHENSIVE METABOLIC PANEL
ALT: 13 U/L (ref 0–55)
AST (SGOT): 19 U/L (ref 5–34)
Albumin/Globulin Ratio: 1.3 (ref 0.9–2.2)
Albumin: 4 g/dL (ref 3.5–5.0)
Alkaline Phosphatase: 92 U/L (ref 37–106)
Anion Gap: 13 (ref 5.0–15.0)
BUN: 14 mg/dL (ref 7–19)
Bilirubin, Total: 0.3 mg/dL (ref 0.2–1.2)
CO2: 26 mEq/L (ref 22–29)
Calcium: 9.6 mg/dL (ref 7.9–10.2)
Chloride: 105 mEq/L (ref 100–111)
Creatinine: 0.8 mg/dL (ref 0.6–1.0)
Globulin: 3.1 g/dL (ref 2.0–3.6)
Glucose: 88 mg/dL (ref 70–100)
Potassium: 4.2 mEq/L (ref 3.5–5.1)
Protein, Total: 7.1 g/dL (ref 6.0–8.3)
Sodium: 144 mEq/L (ref 136–145)

## 2017-01-18 LAB — TROPONIN I: Troponin I: <0.01 ng/mL (ref 0.00–0.09)

## 2017-01-18 LAB — PT AND APTT
PT INR: 0.9 (ref 0.9–1.1)
PT: 12.6 s (ref 12.6–15.0)
PTT: 32 s (ref 23–37)

## 2017-01-18 LAB — GFR: EGFR: >60

## 2017-01-18 LAB — B-TYPE NATRIURETIC PEPTIDE: B-Natriuretic Peptide: 56 pg/mL (ref 0–100)

## 2017-01-18 LAB — PHOSPHORUS: Phosphorus: 3.6 mg/dL (ref 2.3–4.7)

## 2017-01-18 LAB — MAGNESIUM: Magnesium: 2.3 mg/dL (ref 1.6–2.6)

## 2017-01-18 LAB — IHS D-DIMER: D-Dimer: 0.28 ug/mL FEU (ref 0.00–0.70)

## 2017-01-18 NOTE — ED Provider Notes (Signed)
EMERGENCY DEPARTMENT NOTE    Physician/Midlevel provider first contact with patient: 01/18/17 1334         HISTORY OF PRESENT ILLNESS   Historian:Patient  Translator Used: No    Chief Complaint: Chest Pain     Mechanism of Injury:       71 y.o. female presents to the ED complaining of chest pain, palpitations and SOB.  Patient reports symptoms starting yesterday evening while sitting.  Patient reports her husband passed away last week and may be contributing to symptoms.  No nausea or diaphoresis.    1. Location of symptoms: L chest  2. Onset of symptoms: 18 hours PTA  3. What was patient doing when symptoms started (Context): see above  4. Severity: moderate  5. Timing: constant  6. Activities that worsen symptoms: nothing  7. Activities that improve symptoms: nothing  8. Quality: tight  9. Radiation of symptoms: no  10. Associated signs and Symptoms: see above  11. Are symptoms worsening? yes  MEDICAL HISTORY     Past Medical History:  Past Medical History:   Diagnosis Date   . Breast cancer 2014    right breast mastectomy   . Hyperlipidemia    . Hypertension    . Malignant neoplasm of overlapping sites of right female breast 10/19/2015       Past Surgical History:  Past Surgical History:   Procedure Laterality Date   . HYSTERECTOMY     . MASTECTOMY Right 2015       Social History:  Social History     Social History   . Marital status: Widowed     Spouse name: N/A   . Number of children: N/A   . Years of education: N/A     Occupational History   . Not on file.     Social History Main Topics   . Smoking status: Never Smoker   . Smokeless tobacco: Never Used   . Alcohol use Yes      Comment: socially   . Drug use: Unknown   . Sexual activity: Not on file     Other Topics Concern   . Not on file     Social History Narrative   . No narrative on file       Family History:  Family History   Problem Relation Age of Onset   . Breast cancer Neg Hx        Outpatient Medication:  Discharge Medication List as of 01/18/2017   5:00 PM      CONTINUE these medications which have NOT CHANGED    Details   acetaminophen (TYLENOL) 500 MG tablet Take 500 mg by mouth as needed for Pain., Historical Med      anastrozole (ARIMIDEX) 1 MG tablet TAKE ONE TABLET BY MOUTH ONCE DAILY, Normal      aspirin 81 MG EC tablet Take 81 mg by mouth daily., Historical Med      ibuprofen (ADVIL,MOTRIN) 600 MG tablet Take 1 tablet (600 mg total) by mouth every 6 (six) hours as needed for Pain.for up to 30 doses, Starting Mon 09/12/2016, Normal      levothyroxine (SYNTHROID, LEVOTHROID) 25 MCG tablet Take 25 mcg by mouth every morning.    , Starting Wed 12/25/2013, Historical Med      lisinopril (PRINIVIL,ZESTRIL) 20 MG tablet Take 1 tablet (20 mg total) by mouth daily., Starting Tue 08/09/2016, Normal      omeprazole (PRILOSEC) 10 MG capsule Take 2 capsules (20 mg  total) by mouth daily., Starting Wed 12/17/2014, Normal      simvastatin (ZOCOR) 40 MG tablet Take 40 mg by mouth every morning.    , Starting Tue 04/08/2014, Historical Med      vitamin B-1 (THIAMINE) 100 MG tablet Take 100 mg by mouth every morning., Historical Med               REVIEW OF SYSTEMS   Review of Systems   Constitutional: Negative.  Negative for chills and fever.   Respiratory: Positive for shortness of breath.    Cardiovascular: Positive for chest pain and palpitations.   Gastrointestinal: Negative.  Negative for abdominal pain, nausea and vomiting.   Neurological: Negative.  Negative for dizziness and headaches.   All other systems reviewed and are negative.       PHYSICAL EXAM     ED Triage Vitals [01/18/17 1324]   Enc Vitals Group      BP 178/72      Heart Rate 70      Resp Rate 16      Temp 99.4 F (37.4 C)      Temp Source Oral      SpO2 100 %      Weight 73 kg      Height 1.6 m      Head Circumference       Peak Flow       Pain Score 5      Pain Loc       Pain Edu?       Excl. in GC?      Physical Exam   Constitutional: She is oriented to person, place, and time. She appears  well-developed and well-nourished. No distress.   HENT:   Head: Normocephalic and atraumatic.   Mouth/Throat: Oropharynx is clear and moist.   Eyes: Conjunctivae are normal. Pupils are equal, round, and reactive to light.   Cardiovascular: Normal rate, regular rhythm and normal heart sounds.    Pulmonary/Chest: Effort normal and breath sounds normal.   Abdominal: Soft. There is no tenderness. There is no rebound and no guarding.   Musculoskeletal: She exhibits no edema or tenderness.   Neurological: She is alert and oriented to person, place, and time.   Skin: Skin is warm and dry.   Nursing note and vitals reviewed.      MEDICAL DECISION MAKING     DISCUSSION    Patient with 18 hours of L sided chest pain, palpitations and SOB.  Given h/o malignancy, d-dimer sent and negative for PE.  CXR negative for PNA or PTX.  Nonspecific ECG and negative trop - unlikely ACS.  Doubt dissection.      Discussed with Dr. Betti Cruz who agrees with plan for close follow-up.  Patient given strict return precautions.    Vital Signs: Reviewed the patient?s vital signs.   Nursing Notes: Reviewed and utilized available nursing notes.  Medical Records Reviewed: Reviewed available past medical records.  Counseling: The emergency provider has spoken with the patient and discussed today?s findings, in addition to providing specific details for the plan of care.  Questions are answered and there is agreement with the plan.      CARDIAC STUDIES    The following cardiac studies were independently interpreted by the Emergency Medicine Physician.  For full cardiac study results please see chart.    Monitor Strip  Interpreted by ED Physician  Rate: 58  Rhythm: Sinus bradycardia  ST Changes: none    EKG  Interpretation:  Signed and interpreted byED Physician   Time Interpreted: 1330  Rate: 61  Rhythm: NSR  Axis: normal  Intervals: normal  Blocks: none  ST segments: nonspecific changes  Interpretation: Nonspecific  EKG      RADIOLOGY IMAGING STUDIES       XR Chest  AP Portable   Final Result    No radiographic evidence for acute cardiopulmonary disease.      Lorenda Peck, MD    01/18/2017 4:50 PM              PULSE OXIMETRY    Oxygen Saturation by Pulse Oximetry: 100%  Interventions: none  Interpretation:  normal    EMERGENCY DEPT. MEDICATIONS      ED Medication Orders     None          LABORATORY RESULTS    Ordered and independently interpreted AVAILABLE laboratory tests. Please see results section in chart for full details.  Results for orders placed or performed during the hospital encounter of 01/18/17   CBC with differential   Result Value Ref Range    WBC 6.30 3.50 - 10.80 x10 3/uL    Hgb 12.6 12.0 - 16.0 g/dL    Hematocrit 16.1 09.6 - 47.0 %    Platelets 324 140 - 400 x10 3/uL    RBC 4.66 4.20 - 5.40 x10 6/uL    MCV 86.5 80.0 - 100.0 fL    MCH 27.0 (L) 28.0 - 32.0 pg    MCHC 31.3 (L) 32.0 - 36.0 g/dL    RDW 13 12 - 15 %    MPV 11.4 9.4 - 12.3 fL    Neutrophils 56.8 None %    Lymphocytes Automated 34.6 None %    Monocytes 6.8 None %    Eosinophils Automated 1.1 None %    Basophils Automated 0.5 None %    Immature Granulocyte 0.2 None %    Nucleated RBC 0.0 0.0 - 1.0 /100 WBC    Neutrophils Absolute 3.58 1.80 - 8.10 x10 3/uL    Abs Lymph Automated 2.18 0.50 - 4.40 x10 3/uL    Abs Mono Automated 0.43 0.00 - 1.20 x10 3/uL    Abs Eos Automated 0.07 0.00 - 0.70 x10 3/uL    Absolute Baso Automated 0.03 0.00 - 0.20 x10 3/uL    Absolute Immature Granulocyte 0.01 0 x10 3/uL    Absolute NRBC 0.00 0 x10 3/uL   D-Dimer   Result Value Ref Range    D-Dimer 0.28 0.00 - 0.70 ug/mL FEU   PT/APTT   Result Value Ref Range    PT 12.6 12.6 - 15.0 sec    PT INR 0.9 0.9 - 1.1    PT Anticoag. Given Within 48 hrs. None     PTT 32 23 - 37 sec   Comprehensive metabolic panel   Result Value Ref Range    Glucose 88 70 - 100 mg/dL    BUN 14 7 - 19 mg/dL    Creatinine 0.8 0.6 - 1.0 mg/dL    Sodium 045 409 - 811 mEq/L    Potassium 4.2 3.5 - 5.1 mEq/L    Chloride 105 100 - 111 mEq/L    CO2  26 22 - 29 mEq/L    Calcium 9.6 7.9 - 10.2 mg/dL    Protein, Total 7.1 6.0 - 8.3 g/dL    Albumin 4.0 3.5 - 5.0 g/dL    AST (SGOT) 19 5 - 34 U/L  ALT 13 0 - 55 U/L    Alkaline Phosphatase 92 37 - 106 U/L    Bilirubin, Total 0.3 0.2 - 1.2 mg/dL    Globulin 3.1 2.0 - 3.6 g/dL    Albumin/Globulin Ratio 1.3 0.9 - 2.2    Anion Gap 13.0 5.0 - 15.0   B-type Natriuretic Peptide   Result Value Ref Range    B-Natriuretic Peptide 56 0 - 100 pg/mL   Magnesium   Result Value Ref Range    Magnesium 2.3 1.6 - 2.6 mg/dL   Phosphorus   Result Value Ref Range    Phosphorus 3.6 2.3 - 4.7 mg/dL   Troponin I   Result Value Ref Range    Troponin I <0.01 0.00 - 0.09 ng/mL   GFR   Result Value Ref Range    EGFR >60.0    ECG 12 lead   Result Value Ref Range    Ventricular Rate 61 BPM    Atrial Rate 61 BPM    P-R Interval 164 ms    QRS Duration 88 ms    Q-T Interval 364 ms    QTC Calculation (Bezet) 366 ms    P Axis 60 degrees    R Axis 42 degrees    T Axis -7 degrees       CRITICAL CARE/PROCEDURES    Procedures    DIAGNOSIS      Diagnosis:  Final diagnoses:   Palpitations   Essential hypertension       Disposition:  ED Disposition     ED Disposition Condition Date/Time Comment    Discharge  Wed Jan 18, 2017  5:00 PM Emily [name] discharge to home/self care.    Condition at disposition: Stable          Prescriptions:  Discharge Medication List as of 01/18/2017  5:00 PM      CONTINUE these medications which have NOT CHANGED    Details   acetaminophen (TYLENOL) 500 MG tablet Take 500 mg by mouth as needed for Pain., Historical Med      anastrozole (ARIMIDEX) 1 MG tablet TAKE ONE TABLET BY MOUTH ONCE DAILY, Normal      aspirin 81 MG EC tablet Take 81 mg by mouth daily., Historical Med      ibuprofen (ADVIL,MOTRIN) 600 MG tablet Take 1 tablet (600 mg total) by mouth every 6 (six) hours as needed for Pain.for up to 30 doses, Starting Mon 09/12/2016, Normal      levothyroxine (SYNTHROID, LEVOTHROID) 25 MCG tablet Take 25 mcg by mouth every  morning.    , Starting Wed 12/25/2013, Historical Med      lisinopril (PRINIVIL,ZESTRIL) 20 MG tablet Take 1 tablet (20 mg total) by mouth daily., Starting Tue 08/09/2016, Normal      omeprazole (PRILOSEC) 10 MG capsule Take 2 capsules (20 mg total) by mouth daily., Starting Wed 12/17/2014, Normal      simvastatin (ZOCOR) 40 MG tablet Take 40 mg by mouth every morning.    , Starting Tue 04/08/2014, Historical Med      vitamin B-1 (THIAMINE) 100 MG tablet Take 100 mg by mouth every morning., Historical Med                  Larina Bras, MD  01/18/17 2015

## 2017-01-18 NOTE — ED Notes (Signed)
IV attempt x 1 without success.  Patient has no complaints at this time.   No CP, SOB and nausea at this time.

## 2017-01-18 NOTE — ED Notes (Signed)
Attempted IV insertion x1. Patient yelled to take out the needle prior to blood flash in the catheter. RN notified.

## 2017-01-18 NOTE — Discharge Instructions (Signed)
Palpitations     You have been diagnosed with "palpitations."     Palpitations are beats in the chest that feel funny or strange. They are often caused by extra heartbeats that come earlier than normal. These are either "premature atrial contractions" or "premature ventricular contractions," depending on where in the heart they happen. Palpitations often go away on their own and often cause no serious problems. They are sometimes related to stress or lack of sleep. They can also be related too much caffeine. These symptoms can be caused by many over-the-counter (no prescription needed) cold medicines, diet pills and "natural" vitamin supplements with stimulants, often ephedrine (Ephedrine is also known by its traditional Chinese name, Ma huang).     Palpitations feel different for different people. Some patients describe a feeling of "butterflies" in the chest. Others say it feels like the heart is "flipping over" in the chest. Palpitations may happen often but should last only a second or two each time. They should not cause any chest pain, lightheadedness, dizziness or fainting.     There is no specific treatment for palpitations. However, you should avoid all caffeine and cold medications. Also avoid all natural stimulants and chocolate.     Follow up with your primary doctor in the next week to make sure your symptoms are getting better. In some cases, a Holter monitor may be ordered. A Holter monitor is a portable heart monitor. It records your heart's electrical rhythm. You will need to see your regular doctor to get the results from the test.     YOU SHOULD SEEK MEDICAL ATTENTION IMMEDIATELY, EITHER HERE OR AT THE NEAREST EMERGENCY DEPARTMENT, IF ANY OF THE FOLLOWING OCCURS:  · Lightheadedness or the feeling you might faint.  · An unusually fast or slow heart rate.  · Chest pain or shortness of breath.  · Palpitations increase when you exercise.  · Any other worsening symptoms or concerns.

## 2017-01-21 LAB — ECG 12-LEAD
Atrial Rate: 61 {beats}/min
P Axis: 60 degrees
P-R Interval: 164 ms
Q-T Interval: 364 ms
QRS Duration: 88 ms
QTC Calculation (Bezet): 366 ms
R Axis: 42 degrees
T Axis: -7 degrees
Ventricular Rate: 61 {beats}/min

## 2017-02-22 ENCOUNTER — Encounter (HOSPITAL_COMMUNITY): Payer: Self-pay

## 2017-02-22 ENCOUNTER — Emergency Department (HOSPITAL_COMMUNITY): Payer: Medicare Other

## 2017-02-22 ENCOUNTER — Emergency Department (HOSPITAL_COMMUNITY)
Admission: EM | Admit: 2017-02-22 | Discharge: 2017-02-22 | Disposition: A | Discharge status: Home / Self Care | Payer: Medicare Other | Attending: Emergency Medicine | Admitting: Emergency Medicine

## 2017-02-22 DIAGNOSIS — Z853 Personal history of malignant neoplasm of breast: Secondary | ICD-10-CM | POA: Insufficient documentation

## 2017-02-22 DIAGNOSIS — I10 Essential (primary) hypertension: Secondary | ICD-10-CM | POA: Diagnosis not present

## 2017-02-22 DIAGNOSIS — Z7982 Long term (current) use of aspirin: Secondary | ICD-10-CM | POA: Insufficient documentation

## 2017-02-22 DIAGNOSIS — N644 Mastodynia: Secondary | ICD-10-CM | POA: Diagnosis present

## 2017-02-22 DIAGNOSIS — R0789 Other chest pain: Secondary | ICD-10-CM | POA: Diagnosis not present

## 2017-02-22 DIAGNOSIS — Z79899 Other long term (current) drug therapy: Secondary | ICD-10-CM | POA: Diagnosis not present

## 2017-02-22 HISTORY — DX: Essential (primary) hypertension: I10

## 2017-02-22 HISTORY — DX: Malignant neoplasm of unspecified site of unspecified female breast: C50.919

## 2017-02-22 LAB — CBC
HCT: 38.8 % (ref 36.0–46.0)
HEMOGLOBIN: 12.1 g/dL (ref 12.0–15.0)
MCH: 26.7 pg (ref 26.0–34.0)
MCHC: 31.2 g/dL (ref 30.0–36.0)
MCV: 85.7 fL (ref 78.0–100.0)
PLATELETS: 363 10*3/uL (ref 150–400)
RBC: 4.53 MIL/uL (ref 3.87–5.11)
RDW: 13.6 % (ref 11.5–15.5)
WBC: 7.6 10*3/uL (ref 4.0–10.5)

## 2017-02-22 LAB — BASIC METABOLIC PANEL
Anion gap: 6 (ref 5–15)
BUN: 12 mg/dL (ref 6–20)
CALCIUM: 9.3 mg/dL (ref 8.9–10.3)
CO2: 30 mmol/L (ref 22–32)
CREATININE: 0.79 mg/dL (ref 0.44–1.00)
Chloride: 105 mmol/L (ref 101–111)
GFR calc Af Amer: >60 mL/min (ref >60)
GFR calc non Af Amer: >60 mL/min (ref >60)
Glucose, Bld: 106 mg/dL — ABNORMAL HIGH (ref 65–99)
Potassium: 4.2 mmol/L (ref 3.5–5.1)
Sodium: 141 mmol/L (ref 135–145)

## 2017-02-22 LAB — I-STAT TROPONIN, ED
TROPONIN I, POC: 0 ng/mL (ref 0.00–0.08)
Troponin i, poc: 0 ng/mL (ref 0.00–0.08)

## 2017-02-22 LAB — D-DIMER, QUANTITATIVE: D-Dimer, Quant: 3.87 ug/mL-FEU — ABNORMAL HIGH (ref 0.00–0.50)

## 2017-02-22 MED ORDER — ONDANSETRON 4 MG PO TBDP
4.0000 mg | ORAL_TABLET | Freq: Once | ORAL | Status: AC
  Administered 2017-02-22: 4 mg via ORAL
  Filled 2017-02-22: qty 1

## 2017-02-22 MED ORDER — TRAMADOL HCL 50 MG PO TABS: 50.0000 mg | ORAL_TABLET | Freq: Four times a day (QID) | ORAL | 0 refills | Status: AC | PRN

## 2017-02-22 MED ORDER — IOPAMIDOL (ISOVUE-370) INJECTION 76%
INTRAVENOUS | Status: AC
  Administered 2017-02-22: 82 mL via INTRAVENOUS
  Filled 2017-02-22: qty 100

## 2017-02-22 MED ORDER — SODIUM CHLORIDE 0.9 % IV BOLUS (SEPSIS)
1000.0000 mL | Freq: Once | INTRAVENOUS | Status: AC
  Administered 2017-02-22: 1000 mL via INTRAVENOUS

## 2017-02-22 MED ORDER — IOPAMIDOL (ISOVUE-370) INJECTION 76%
100.0000 mL | Freq: Once | INTRAVENOUS | Status: AC | PRN
  Administered 2017-02-22: 82 mL via INTRAVENOUS

## 2017-02-22 NOTE — ED Provider Notes (Signed)
[facility] DEPT Provider Note   CSN: [id number] Arrival date & time: 02/22/17  1045     History   Chief Complaint Chief Complaint  Patient presents with  . Weakness  . Chest Pain    HPI Nancy Duke is a 71 y.o. female.  HPI  Patient with past medical history of breast cancer, presents with chest pain, palpitations that began last night. She also complains of right sided breast pain. She had a mastectomy about 4 years ago due to her breast cancer and states that she has never experienced pain like this before. She also reports some shortness of breath and a cough, as well as nausea. She reports taking ibuprofen last night for the pain. She states that strong narcotic pain medications have caused her to "lose my memory" in the past since she does not take them. She denies fevers, urinary complaints, abdominal pain, diarrhea, vomiting.   Of note, patient states that he is from [place] and was seen 4/18 with complaints of chest pain, palpitations or SOB. Prior to this episode, patient states that her husband passed away the week before and this could be contributing to her symptoms. Chart review shows that at this visit, EKG showed normal sinus rhythm with nonspecific ST changes but no other arrhythmias present, no acute cardiopulmonary disease on chest x-ray, negative d-dimer, negative troponin. Patient states that she was told that she has nothing wrong with her heart but "I don't believe my doctor."  Past Medical History:  Diagnosis Date  . Breast cancer ([place])   . Hypertension     There are no active problems to display for this patient.   Past Surgical History:  Procedure Laterality Date  . ABDOMINAL HYSTERECTOMY    . UTERINE FIBROID SURGERY      OB History    No data available       Home Medications    Prior to Admission medications   Medication Sig Start Date End Date Taking? Authorizing Provider  anastrozole (ARIMIDEX) 1 MG tablet Take 1 mg by mouth daily.  07/22/16  Yes [provider]  aspirin 81 MG EC tablet Take 81 mg by mouth daily.   Yes [provider]  levothyroxine (SYNTHROID, LEVOTHROID) 25 MCG tablet Take 25 mcg by mouth daily. 12/25/13  Yes [provider]  lisinopril (PRINIVIL,ZESTRIL) 20 MG tablet Take 20 mg by mouth daily. 08/09/16  Yes [provider]  naproxen sodium (ANAPROX) 220 MG tablet Take 220 mg by mouth 2 (two) times daily with a meal.   Yes [provider]  omeprazole (PRILOSEC) 10 MG capsule Take 10 mg by mouth daily. 12/17/14  Yes [provider]  simvastatin (ZOCOR) 40 MG tablet Take 40 mg by mouth daily. 04/08/14  Yes [provider]  thiamine (VITAMIN B-1) 100 MG tablet Take 100 mg by mouth daily.   Yes [provider]    Family History History reviewed. No pertinent family history.  Social History Social History  Substance Use Topics  . Smoking status: Never Smoker  . Smokeless tobacco: Never Used  . Alcohol use Yes     Comment: social     Allergies   Caffeine and Morphine and related   Review of Systems Review of Systems  Constitutional: Positive for appetite change and chills. Negative for fever.  HENT: Negative for ear pain, rhinorrhea, sneezing and sore throat.   Eyes: Negative for photophobia and visual disturbance.  Respiratory: Positive for shortness of breath. Negative for cough, chest  tightness and wheezing.   Cardiovascular: Positive for chest pain and palpitations.  Gastrointestinal: Positive for nausea. Negative for abdominal pain, blood in stool, constipation, diarrhea and vomiting.  Genitourinary: Negative for dysuria, hematuria and urgency.  Musculoskeletal: Negative for myalgias.  Skin: Negative for rash.  Neurological: Negative for dizziness, syncope, weakness, light-headedness and headaches.     Physical Exam Updated Vital Signs BP (!) 177/72   Pulse (!) 51   Temp 98.2 F (36.8 C) (Oral)   Resp 16   Ht 5'  3" (1.6 m)   Wt 73.5 kg (162 lb)   SpO2 97%   BMI 28.70 kg/m   Physical Exam  Constitutional: She appears well-developed and well-nourished. No distress.  HENT:  Head: Normocephalic and atraumatic.  Nose: Nose normal.  Eyes: Conjunctivae and EOM are normal. Left eye exhibits no discharge. No scleral icterus.  Neck: Normal range of motion. Neck supple.  Cardiovascular: Normal rate, regular rhythm, normal heart sounds and intact distal pulses.  Exam reveals no gallop and no friction rub.   No murmur heard. Pulmonary/Chest: Effort normal and breath sounds normal. No respiratory distress. She exhibits tenderness (Tenderness to palpation of the right breast area and below right arm.).  Abdominal: Soft. Bowel sounds are normal. She exhibits no distension. There is no tenderness. There is no guarding.  Musculoskeletal: Normal range of motion. She exhibits no edema.  Neurological: She is alert. She exhibits normal muscle tone. Coordination normal.  Skin: Skin is warm and dry. No rash noted.  Patient has prior mastectomy on right side. She has tenderness to palpation in this area but no temperature change, visible bruising, other signs of trauma or infection.  Psychiatric: She has a normal mood and affect.  Nursing note and vitals reviewed.    ED Treatments / Results  Labs (all labs ordered are listed, but only abnormal results are displayed) Labs Reviewed  BASIC METABOLIC PANEL - Abnormal; Notable for the following:       Result Value   Glucose, Bld 106 (*)    All other components within normal limits  D-DIMER, QUANTITATIVE (NOT AT [facility]) - Abnormal; Notable for the following:    D-Dimer, Quant 3.87 (*)    All other components within normal limits  CBC  I-STAT TROPOININ, ED    EKG  EKG Interpretation  Date/Time:  Wednesday Feb 22 2017 11:05:42 EDT Ventricular Rate:  57 PR Interval:    QRS Duration: 100 QT Interval:  413 QTC Calculation: 403 R Axis:   43 Text Interpretation:   Sinus rhythm Abnormal R-wave progression, early transition Borderline T wave abnormalities Minimal ST elevation, anterior leads no prior EKG for comparison  Confirmed by LIU MD, DANA [id number]) on 02/22/2017 1:24:44 PM       Radiology Dg Chest 2 View  Result Date: 02/22/2017 CLINICAL DATA:  Breast cancer EXAM: CHEST  2 VIEW COMPARISON:  None. FINDINGS: Mild cardiomegaly. No confluent opacities or effusions. No acute bony abnormality. IMPRESSION: Cardiomegaly.  No active disease. Electronically Signed   By: Rolm Baptise M.D.   On: 02/22/2017 11:31    Procedures Procedures (including critical care time)  Medications Ordered in ED Medications  ondansetron (ZOFRAN-ODT) disintegrating tablet 4 mg (4 mg Oral Given 02/22/17 1220)  sodium chloride 0.9 % bolus 1,000 mL (0 mLs Intravenous Stopped 02/22/17 1525)  iopamidol (ISOVUE-370) 76 % injection 100 mL (100 mLs Intravenous Contrast Given 02/22/17 1524)     Initial Impression / Assessment and Plan / ED Course  I have  reviewed the triage vital signs and the nursing notes.  Pertinent labs & imaging results that were available during my care of the patient were reviewed by me and considered in my medical decision making (see chart for details).     Patient's history and symptoms concerning for ACS versus PE versus cellulitis. Patient reports chest pain and palpitations have been going on for months. She also reports right-sided chest/breast pain when she had mastectomy 4 years ago. Patient is afebrile with no history of fever. Does not appear to be in acute distress and is resting comfortably. No evidence of cellulitis or other infection in the area of mastectomy noted. Troponin negative 1 today. EKG shows nonspecific T changes. CBC, BMP normal today. D-dimer elevated at 3.87. Obtained CTA to evaluate for PE due to elevated d-dimer. CT tech states that while she was trying to flush the patient's IV, her arms got swollen and painful and she was unable  to administer the contrast. Patient states that her arm feels fine now but she did have a little bit of pain when the contrast was administered. Spoke to the nurse who states that we'll try to obtain additional IV access. CT pending at end of shift. Care assumed by Margarita Mail PA-C pending CTA. If this is negative we'll discharge home and reassured patient and negative workup today. Encouraged her to follow up with her cardiologist for further evaluation.  Final Clinical Impressions(s) / ED Diagnoses   Final diagnoses:  Chest wall pain    New Prescriptions New Prescriptions   No medications on file     Delia Heady, PA-C 02/22/17 1607    Forde Dandy, MD 02/22/17 2003

## 2017-02-22 NOTE — ED Notes (Signed)
Pt given water and ambulated to restroom

## 2017-02-22 NOTE — ED Triage Notes (Signed)
Per pt, hx of breast cancer.  Has started having chest pain with increased weakness since last night.  Pt states feels palpitations in heart.  Coughing in morning.  No fever.  Nausea with no vomiting.

## 2017-02-22 NOTE — Discharge Instructions (Addendum)
Follow up with cardiologist for further evaluation as soon as possible.  Your caregiver has diagnosed you as having chest pain that is not specific for one problem, but does not require admission.  You are at low risk for an acute heart condition or other serious illness. Chest pain comes from many different causes.  SEEK IMMEDIATE MEDICAL ATTENTION IF: You have severe chest pain, especially if the pain is crushing or pressure-like and spreads to the arms, back, neck, or jaw, or if you have sweating, nausea (feeling sick to your stomach), or shortness of breath. THIS IS AN EMERGENCY. Don't wait to see if the pain will go away. Get medical help at once. Call 911 or [phone] (operator). DO NOT drive yourself to the hospital.  Your chest pain gets worse and does not go away with rest.  You have an attack of chest pain lasting longer than usual, despite rest and treatment with the medications your caregiver has prescribed.  You wake from sleep with chest pain or shortness of breath.  You feel dizzy or faint.  You have chest pain not typical of your usual pain for which you originally saw your caregiver.

## 2017-02-22 NOTE — ED Notes (Signed)
Patient transported to CT 

## 2017-03-08 ENCOUNTER — Other Ambulatory Visit: Payer: Self-pay | Admitting: Family Medicine

## 2017-03-08 DIAGNOSIS — R928 Other abnormal and inconclusive findings on diagnostic imaging of breast: Secondary | ICD-10-CM

## 2017-03-10 ENCOUNTER — Ambulatory Visit: Admission: RE | Admit: 2017-03-10 | Payer: Medicare Other | Source: Ambulatory Visit

## 2017-03-21 ENCOUNTER — Ambulatory Visit (INDEPENDENT_AMBULATORY_CARE_PROVIDER_SITE_OTHER): Payer: Medicare Other | Admitting: Cardiology

## 2017-04-11 ENCOUNTER — Ambulatory Visit
Admission: RE | Admit: 2017-04-11 | Discharge: 2017-04-11 | Disposition: A | Discharge status: Home / Self Care | Payer: Medicare Other | Source: Ambulatory Visit | Attending: Family Medicine | Admitting: Family Medicine

## 2017-04-11 ENCOUNTER — Other Ambulatory Visit: Payer: Self-pay | Admitting: Family Medicine

## 2017-04-11 DIAGNOSIS — R928 Other abnormal and inconclusive findings on diagnostic imaging of breast: Secondary | ICD-10-CM

## 2017-04-21 ENCOUNTER — Other Ambulatory Visit (INDEPENDENT_AMBULATORY_CARE_PROVIDER_SITE_OTHER): Payer: Self-pay | Admitting: Hematology & Oncology

## 2017-04-21 DIAGNOSIS — C50811 Malignant neoplasm of overlapping sites of right female breast: Secondary | ICD-10-CM

## 2017-04-24 ENCOUNTER — Other Ambulatory Visit (INDEPENDENT_AMBULATORY_CARE_PROVIDER_SITE_OTHER): Payer: Self-pay | Admitting: Hematology & Oncology

## 2017-04-24 DIAGNOSIS — Z170 Estrogen receptor positive status [ER+]: Secondary | ICD-10-CM

## 2017-04-24 DIAGNOSIS — C50811 Malignant neoplasm of overlapping sites of right female breast: Secondary | ICD-10-CM

## 2017-04-24 MED ORDER — ANASTROZOLE 1 MG PO TABS
1.0000 mg | ORAL_TABLET | Freq: Every day | ORAL | 0 refills | Status: DC
Start: 2017-04-24 — End: 2017-06-23

## 2017-04-26 ENCOUNTER — Other Ambulatory Visit: Payer: Self-pay | Admitting: Hematology & Oncology

## 2017-04-26 DIAGNOSIS — M8580 Other specified disorders of bone density and structure, unspecified site: Secondary | ICD-10-CM

## 2017-04-26 DIAGNOSIS — C50911 Malignant neoplasm of unspecified site of right female breast: Secondary | ICD-10-CM

## 2017-05-26 ENCOUNTER — Encounter (INDEPENDENT_AMBULATORY_CARE_PROVIDER_SITE_OTHER): Payer: Self-pay | Admitting: Hematology & Oncology

## 2017-05-26 ENCOUNTER — Observation Stay: Payer: Medicare Other

## 2017-05-26 ENCOUNTER — Emergency Department: Payer: Medicare Other

## 2017-05-26 ENCOUNTER — Observation Stay
Admission: EM | Admit: 2017-05-26 | Discharge: 2017-05-27 | Disposition: A | Discharge status: Home / Self Care | Payer: Medicare Other | Attending: Internal Medicine | Admitting: Internal Medicine

## 2017-05-26 DIAGNOSIS — C50811 Malignant neoplasm of overlapping sites of right female breast: Secondary | ICD-10-CM | POA: Diagnosis present

## 2017-05-26 DIAGNOSIS — R531 Weakness: Principal | ICD-10-CM | POA: Insufficient documentation

## 2017-05-26 DIAGNOSIS — R1310 Dysphagia, unspecified: Secondary | ICD-10-CM | POA: Insufficient documentation

## 2017-05-26 DIAGNOSIS — R200 Anesthesia of skin: Secondary | ICD-10-CM | POA: Insufficient documentation

## 2017-05-26 DIAGNOSIS — G8929 Other chronic pain: Secondary | ICD-10-CM | POA: Insufficient documentation

## 2017-05-26 DIAGNOSIS — I10 Essential (primary) hypertension: Secondary | ICD-10-CM | POA: Insufficient documentation

## 2017-05-26 DIAGNOSIS — K5900 Constipation, unspecified: Secondary | ICD-10-CM | POA: Insufficient documentation

## 2017-05-26 DIAGNOSIS — Z7982 Long term (current) use of aspirin: Secondary | ICD-10-CM | POA: Insufficient documentation

## 2017-05-26 DIAGNOSIS — Z79899 Other long term (current) drug therapy: Secondary | ICD-10-CM | POA: Insufficient documentation

## 2017-05-26 DIAGNOSIS — E785 Hyperlipidemia, unspecified: Secondary | ICD-10-CM | POA: Insufficient documentation

## 2017-05-26 DIAGNOSIS — R791 Abnormal coagulation profile: Secondary | ICD-10-CM | POA: Insufficient documentation

## 2017-05-26 DIAGNOSIS — R29898 Other symptoms and signs involving the musculoskeletal system: Secondary | ICD-10-CM

## 2017-05-26 DIAGNOSIS — Z853 Personal history of malignant neoplasm of breast: Secondary | ICD-10-CM | POA: Insufficient documentation

## 2017-05-26 DIAGNOSIS — M549 Dorsalgia, unspecified: Secondary | ICD-10-CM | POA: Insufficient documentation

## 2017-05-26 DIAGNOSIS — Z9011 Acquired absence of right breast and nipple: Secondary | ICD-10-CM | POA: Insufficient documentation

## 2017-05-26 DIAGNOSIS — R202 Paresthesia of skin: Secondary | ICD-10-CM | POA: Insufficient documentation

## 2017-05-26 DIAGNOSIS — E042 Nontoxic multinodular goiter: Secondary | ICD-10-CM | POA: Insufficient documentation

## 2017-05-26 LAB — COMPREHENSIVE METABOLIC PANEL
ALT: 12 U/L (ref 0–55)
AST (SGOT): 21 U/L (ref 5–34)
Albumin/Globulin Ratio: 1.4 (ref 0.9–2.2)
Albumin: 3.9 g/dL (ref 3.5–5.0)
Alkaline Phosphatase: 77 U/L (ref 37–106)
Anion Gap: 8 (ref 5.0–15.0)
BUN: 10 mg/dL (ref 7–19)
Bilirubin, Total: 0.4 mg/dL (ref 0.2–1.2)
CO2: 28 mEq/L (ref 22–29)
Calcium: 9.8 mg/dL (ref 7.9–10.2)
Chloride: 108 mEq/L (ref 100–111)
Creatinine: 0.8 mg/dL (ref 0.6–1.0)
Globulin: 2.8 g/dL (ref 2.0–3.6)
Glucose: 113 mg/dL — ABNORMAL HIGH (ref 70–100)
Potassium: 4.2 mEq/L (ref 3.5–5.1)
Protein, Total: 6.7 g/dL (ref 6.0–8.3)
Sodium: 144 mEq/L (ref 136–145)

## 2017-05-26 LAB — CBC AND DIFFERENTIAL
Absolute NRBC: 0 10*3/uL
Basophils Absolute Automated: 0.03 10*3/uL (ref 0.00–0.20)
Basophils Automated: 0.4 %
Eosinophils Absolute Automated: 0.08 10*3/uL (ref 0.00–0.70)
Eosinophils Automated: 1.1 %
Hematocrit: 36.8 % — ABNORMAL LOW (ref 37.0–47.0)
Hgb: 11.6 g/dL — ABNORMAL LOW (ref 12.0–16.0)
Immature Granulocytes Absolute: 0.02 10*3/uL
Immature Granulocytes: 0.3 %
Lymphocytes Absolute Automated: 2.17 10*3/uL (ref 0.50–4.40)
Lymphocytes Automated: 30.6 %
MCH: 26.9 pg — ABNORMAL LOW (ref 28.0–32.0)
MCHC: 31.5 g/dL — ABNORMAL LOW (ref 32.0–36.0)
MCV: 85.4 fL (ref 80.0–100.0)
MPV: 11.2 fL (ref 9.4–12.3)
Monocytes Absolute Automated: 0.49 10*3/uL (ref 0.00–1.20)
Monocytes: 6.9 %
Neutrophils Absolute: 4.31 10*3/uL (ref 1.80–8.10)
Neutrophils: 60.7 %
Nucleated RBC: 0 /100 WBC (ref 0.0–1.0)
Platelets: 303 10*3/uL (ref 140–400)
RBC: 4.31 10*6/uL (ref 4.20–5.40)
RDW: 14 % (ref 12–15)
WBC: 7.1 10*3/uL (ref 3.50–10.80)

## 2017-05-26 LAB — PT/INR
PT INR: 1 (ref 0.9–1.1)
PT: 13.2 s (ref 12.6–15.0)

## 2017-05-26 LAB — GFR: EGFR: >60

## 2017-05-26 LAB — GLUCOSE WHOLE BLOOD - POCT: Whole Blood Glucose POCT: 113 mg/dL — ABNORMAL HIGH (ref 70–100)

## 2017-05-26 LAB — APTT: PTT: 33 s (ref 23–37)

## 2017-05-26 LAB — TROPONIN I: Troponin I: <0.01 ng/mL (ref 0.00–0.09)

## 2017-05-26 LAB — CK: Creatine Kinase (CK): 93 U/L (ref 29–168)

## 2017-05-26 MED ORDER — GADOBUTROL 1 MMOL/ML IV SOLN
INTRAVENOUS | Status: AC
Start: 2017-05-26 — End: 2017-05-26
  Administered 2017-05-26: 7.5 mL
  Filled 2017-05-26: qty 7.5

## 2017-05-26 MED ORDER — ASPIRIN 81 MG PO TBEC
81.0000 mg | DELAYED_RELEASE_TABLET | Freq: Every day | ORAL | Status: DC
Start: 2017-05-27 — End: 2017-05-27
  Administered 2017-05-27: 81 mg via ORAL
  Filled 2017-05-26: qty 1

## 2017-05-26 MED ORDER — ONDANSETRON 4 MG PO TBDP
4.0000 mg | ORAL_TABLET | Freq: Four times a day (QID) | ORAL | Status: DC | PRN
Start: 2017-05-26 — End: 2017-05-27

## 2017-05-26 MED ORDER — PANTOPRAZOLE SODIUM 40 MG PO TBEC
40.0000 mg | DELAYED_RELEASE_TABLET | Freq: Every morning | ORAL | Status: DC
Start: 2017-05-26 — End: 2017-05-27
  Administered 2017-05-26 – 2017-05-27 (×2): 40 mg via ORAL
  Filled 2017-05-26 (×2): qty 1

## 2017-05-26 MED ORDER — IOHEXOL 350 MG/ML IV SOLN
100.0000 mL | Freq: Once | INTRAVENOUS | Status: AC | PRN
Start: 2017-05-26 — End: 2017-05-26
  Administered 2017-05-26: 100 mL via INTRAVENOUS

## 2017-05-26 MED ORDER — THIAMINE (VITAMIN B1) 100 MG PO TABS (WRAP)
100.0000 mg | ORAL_TABLET | Freq: Every morning | ORAL | Status: DC
Start: 2017-05-27 — End: 2017-05-27
  Administered 2017-05-27: 100 mg via ORAL
  Filled 2017-05-26: qty 1

## 2017-05-26 MED ORDER — ASPIRIN 325 MG PO TABS
325.0000 mg | ORAL_TABLET | Freq: Every day | ORAL | Status: AC
Start: 2017-05-26 — End: 2017-05-26
  Administered 2017-05-26: 325 mg via ORAL
  Filled 2017-05-26 (×2): qty 1

## 2017-05-26 MED ORDER — LEVOTHYROXINE SODIUM 25 MCG PO TABS
25.0000 ug | ORAL_TABLET | Freq: Every day | ORAL | Status: DC
Start: 2017-05-26 — End: 2017-05-27
  Administered 2017-05-26 – 2017-05-27 (×2): 25 ug via ORAL
  Filled 2017-05-26 (×2): qty 1

## 2017-05-26 MED ORDER — ACETAMINOPHEN 325 MG PO TABS
650.0000 mg | ORAL_TABLET | Freq: Four times a day (QID) | ORAL | Status: DC | PRN
Start: 2017-05-26 — End: 2017-05-27
  Administered 2017-05-26 – 2017-05-27 (×2): 650 mg via ORAL
  Filled 2017-05-26 (×2): qty 2

## 2017-05-26 MED ORDER — ENOXAPARIN SODIUM 40 MG/0.4ML SC SOLN
40.0000 mg | Freq: Every day | SUBCUTANEOUS | Status: DC
Start: 2017-05-26 — End: 2017-05-27
  Administered 2017-05-26 – 2017-05-27 (×2): 40 mg via SUBCUTANEOUS
  Filled 2017-05-26 (×2): qty 0.4

## 2017-05-26 MED ORDER — SIMVASTATIN 40 MG PO TABS
40.0000 mg | ORAL_TABLET | Freq: Every morning | ORAL | Status: DC
Start: 2017-05-26 — End: 2017-05-27
  Administered 2017-05-26 – 2017-05-27 (×2): 40 mg via ORAL
  Filled 2017-05-26 (×2): qty 1

## 2017-05-26 MED ORDER — ONDANSETRON HCL 4 MG/2ML IJ SOLN
4.0000 mg | Freq: Four times a day (QID) | INTRAMUSCULAR | Status: DC | PRN
Start: 2017-05-26 — End: 2017-05-27

## 2017-05-26 MED ORDER — HYDRALAZINE HCL 20 MG/ML IJ SOLN
10.0000 mg | INTRAMUSCULAR | Status: DC | PRN
Start: 2017-05-26 — End: 2017-05-27

## 2017-05-26 NOTE — Consults (Signed)
NEUROLOGY CONSULTATION    Date Time: 05/26/17 3:26 PM  Patient Name: Emily [name]  Attending Physician: Rulon Abide, MD      Assessment & Plan:   New onset of left arm and left leg tingling and sensation of bilateral leg heaviness, somewhat difficult to localize.  No report of any facial symptoms.  Perform patient  History of breast cancer   Given her multitude of symptoms, will obtain vascular imaging to rule out ischemia, patient is not a candidate for any kind of intervention at this time, but CTA will be done to assess patient's vasculature.   MRI of the brain and cervical spine will be needed as well.  Given patient is complaining of left arm, left leg, tingling and paresthesias and bilateral leg heaviness.   Check creatine phosphokinase levels.   Further recommendations after.  Initial imaging studies have been completed    History of Present Illness:   Patient is a 71 year old Afro-American lady, with a prior history of breast cancer, comes in to the hospital because of left arm and affect tingling paresthesias, since last night, she also complains of sensation of heaviness, difficulty moving her legs since this morning.  She reports no facial symptoms per se, but reports diffuse body pain throughout, including the neck, both legs, both arms, and the lower back as well    Past Medical History:     Past Medical History:   Diagnosis Date   . Breast cancer 2014    right breast mastectomy   . Hyperlipidemia    . Hypertension    . Malignant neoplasm of overlapping sites of right female breast 10/19/2015       Meds:   home meds       Allergies   Allergen Reactions   . Morphine Itching       Social & Family History:     Social History     Social History   . Marital status: Widowed     Spouse name: N/A   . Number of children: N/A   . Years of education: N/A     Occupational History   . Not on file.     Social History Main Topics   . Smoking status: Never Smoker   . Smokeless tobacco: Never Used   . Alcohol  use Yes      Comment: socially   . Drug use: Unknown   . Sexual activity: Not on file     Other Topics Concern   . Not on file     Social History Narrative   . No narrative on file     Family History   Problem Relation Age of Onset   . Breast cancer Neg Hx        Review of Systems:   No headache, eye, ear nose, throat problems; no coughing or wheezing or shortness of breath, No chest pain or orthopnea, no abdominal pain, nausea or vomiting, diffuse pain in the body and all extremities, no psychiatric, neurological, endocrine, hematological or cardiac complaints except as noted above.         Physical Exam:   Blood pressure 174/80, pulse 68, temperature 99 F (37.2 C), temperature source Oral, resp. rate 18, height 1.6 m (5\' 3" ), weight 78.6 kg (173 lb 4.5 oz), SpO2 99 %.    HEENT: Normocephalic.no carotid bruits  Lungs:  CTA bil  Abd Soft   Cardiac:  S1,S2, normal rate and rhythm  Neck: supple, no cartoid bruits  Extremities:  no edema  Skin: no rashes seen in exposed areas     Neuro:  Level of consciousness:  Alert and appropriate  Oriented:  X 3  Cognition:  Intact naming, recognition, concentration and following complex commands  Cranial Nerves:  II-XII intact.  Except may be mild facial asymmetry noted on exam  Strength:  No upper extremity drift, 5/5 strength x 4 extremities  Coordination:  Intact FTN testing--reduced finger movements of the left arm.  No obvious stiffness appreciated  Reflexes:  +2 throughout, in legs - hard to obtain in arms   Sensation: Intact x 4 extremities to LT, temp bil     Labs:     Recent Labs  Lab 05/26/17  1429   Glucose 113*   BUN 10   Creatinine 0.8   Calcium 9.8   Sodium 144   Potassium 4.2   Chloride 108   CO2 28   Albumin 3.9   AST (SGOT) 21   ALT 12   Bilirubin, Total 0.4   Alkaline Phosphatase 77       Recent Labs  Lab 05/26/17  1429   WBC 7.10   Hgb 11.6*   Hematocrit 36.8*   MCV 85.4   MCH 26.9*   MCHC 31.5*   Platelets 303         Recent Labs      05/26/17   1429   PTT  33    PT  13.2   PT INR  1.0          Radiology Results (24 Hour)     Procedure Component Value Units Date/Time    CT Head WO Contrast [[id number]] Updated:  05/26/17 1517    Order Status:  Sent     Chest AP Portable [[id number]] Collected:  05/26/17 1441    Order Status:  Completed Updated:  05/26/17 1446    Narrative:       History: CVA evaluation    Technique: Single Portable View    Comparison: 01/18/2017    Findings:  There is mild patchy linear airspace disease at the right base  There is no pneumothorax.  The heart is normal in size.    The mediastinum is within normal limits.             Impression:        Mild linear opacity at the right base compatible  with nodular scarring unchanged since CT of 12-05-16    Laurena Slimmer, MD   05/26/2017 2:42 PM           All recent brain and spine imaging (MRI, CT) personally reviewed.    Chart reviewed    Case discussed with: ed       Signed by: Cathe Mons, MD  Spectralink: [phone]      Answering Service: [phone]

## 2017-05-26 NOTE — UM Notes (Signed)
Patient is a 71 year old Afro-American lady, with a prior history of breast cancer, comes in to the hospital because of left arm and affect tingling paresthesias, since last night, she also complains of sensation of heaviness, difficulty moving her legs since this morning.  She reports no facial symptoms per se, but reports diffuse body pain throughout, including the neck, both legs, both arms, and the lower back as well    VS:  Blood pressure 174/80, pulse 68, temperature 99 F (37.2 C), temperature source Oral, resp. rate 18, height 1.6 m (5\' 3" ), weight 78.6 kg (173 lb 4.5 oz), SpO2 99 %.    LABS:  Lab 05/26/17  1429   Glucose 113*     Lab 05/26/17  1429   WBC 7.10   Hgb 11.6*   Hematocrit 36.8*   MCV 85.4   MCH 26.9*   MCHC 31.5*     IMAGING:  EKG- NORMAL SINUS RHYTHM  NONSPECIFIC T WAVE ABNORMALITY  CXR- Mild linear opacity at the right base compatible  with nodular scarring unchanged since CT of 12-05-16  CT HEAD - No CT evidence of acute intracranial pathology.  CT ANGIO HEAD/NECK-          1. No major branch occlusion is identified CT angiogram of the head.   2. There is not evidence of flow-limiting stenosis in the common carotid  arteries or internal carotid arteries.  3. There is an 8 mm low-density focus in the anterior aspect of the  right lobe of the thyroid gland. This is incompletely evaluated in this  study. This can be further characterized sonographically.         MEDS:  aspirin tablet 325 mg   Morphine Itching     DIET:NPO    PLACED IN OBSERVATION STATUS  05/26/17  @1556     PLAN:  NEUROLOGY CONSULT/ASSESSMENT-  "New onset of left arm and left leg tingling and sensation of bilateral leg heaviness, somewhat difficult to localize.  No report of any facial symptoms.  Perform patient  History of breast cancer  ? Given her multitude of symptoms, will obtain vascular imaging to rule out ischemia, patient is not a candidate for any kind of intervention at this time, but CTA will be done to assess  patient's vasculature.  ? MRI of the brain and cervical spine will be needed as well.  Given patient is complaining of left arm, left leg, tingling and paresthesias and bilateral leg heaviness.  ? Check creatine phosphokinase levels.  Further recommendations after.  Initial imaging studies have been completed"

## 2017-05-26 NOTE — Progress Notes (Signed)
Pt onto unit from u/s in stable condition, all immediate needs met. Call light within reach.

## 2017-05-26 NOTE — Progress Notes (Signed)
Pt onto unit from ED with c/o ble weakness and left sided tingling sensations. VSS- permissive hypertension explained to patient. Pt ambulates with unsteady gate and contact guard assist to bathroom. Reviewed orders with patient. Safety precautions discussed and pt verbalized understanding of new orders and poc. wctm

## 2017-05-26 NOTE — ED Provider Notes (Signed)
EMERGENCY DEPARTMENT NOTE    Physician/Midlevel provider first contact with patient: 05/26/17 1400         HISTORY OF PRESENT ILLNESS   Historian:Patient  Translator Used: No    Chief Complaint: Tingling         71 y.o. female hx of HTN, HPL who presents with left sided numbness and BLE weakness. Pt reports symptom onset was last night. Pt reports she began to have tingling and numbness of left upper and lower extremity. This was followed by bilateral leg weakness. Pt reports today, she continued to have symptoms. She reports she has been able to ambulate, however notes difficulty with lifting both legs. Notes left sided chest discomfort. No fevers or chills. No nausea or vomiting. Denies shortness of breath. No recent trauma or falls.     1. Location of symptoms: left side, bilateral LE  2. Onset of symptoms: last night   3. What was patient doing when symptoms started (Context): see above  4. Severity: moderate  5. Timing: constant   6. Activities that worsen symptoms: n/a  7. Activities that improve symptoms: n/a  8. Quality: numbness, weakness   9. Radiation of symptoms: no  10. Associated signs and Symptoms: see above  11. Are symptoms worsening? yes  MEDICAL HISTORY     Past Medical History:  Past Medical History:   Diagnosis Date   . Breast cancer 2014    right breast mastectomy   . Hyperlipidemia    . Hypertension    . Malignant neoplasm of overlapping sites of right female breast 10/19/2015       Past Surgical History:  Past Surgical History:   Procedure Laterality Date   . HYSTERECTOMY     . MASTECTOMY Right 2015       Social History:  Social History     Social History   . Marital status: Widowed     Spouse name: N/A   . Number of children: N/A   . Years of education: N/A     Occupational History   . Not on file.     Social History Main Topics   . Smoking status: Never Smoker   . Smokeless tobacco: Never Used   . Alcohol use Yes      Comment: socially   . Drug use: Unknown   . Sexual activity: Not on file      Other Topics Concern   . Not on file     Social History Narrative   . No narrative on file       Family History:  Family History   Problem Relation Age of Onset   . Breast cancer Neg Hx        Outpatient Medication:  Previous Medications    ACETAMINOPHEN (TYLENOL) 500 MG TABLET    Take 500 mg by mouth as needed for Pain.    ANASTROZOLE (ARIMIDEX) 1 MG TABLET    Take 1 tablet (1 mg total) by mouth daily.    ASPIRIN 81 MG EC TABLET    Take 81 mg by mouth daily.    IBUPROFEN (ADVIL,MOTRIN) 600 MG TABLET    Take 1 tablet (600 mg total) by mouth every 6 (six) hours as needed for Pain.for up to 30 doses    LEVOTHYROXINE (SYNTHROID, LEVOTHROID) 25 MCG TABLET    Take 25 mcg by mouth every morning.        LISINOPRIL (PRINIVIL,ZESTRIL) 20 MG TABLET    Take 1 tablet (20 mg total) by  mouth daily.    MULTIPLE VITAMINS-MINERALS (MULTIVITAMIN WITH MINERALS) TABLET    Take 1 tablet by mouth daily.    OMEPRAZOLE (PRILOSEC) 10 MG CAPSULE    Take 2 capsules (20 mg total) by mouth daily.    SIMVASTATIN (ZOCOR) 40 MG TABLET    Take 40 mg by mouth every morning.        VITAMIN B-1 (THIAMINE) 100 MG TABLET    Take 100 mg by mouth every morning.         REVIEW OF SYSTEMS   Review of Systems  Constitutional: Negative for fever and chills.   HENT: Negative for congestion and sore throat.  Eyes: Negative for eye discharge and eye redness.   Cardiovascular: Negative for chest pain and chest pressure   Respiratory: Negative for cough and sputum production.    Gastrointestinal: Negative for nausea, vomiting, abdominal pain and diarrhea.   Genitourinary: Negative for dysuria, urgency and frequency.   Neurological: Positive for bilateral leg weakness and left sided tingling  All other systems negative.  PHYSICAL EXAM     ED Triage Vitals [05/26/17 1403]   Enc Vitals Group      BP 178/74      Heart Rate 72      Resp Rate 16      Temp 99 F (37.2 C)      Temp Source Oral      SpO2 97 %      Weight 78.6 kg      Height 1.6 m      Head  Circumference       Peak Flow       Pain Score 8      Pain Loc       Pain Edu?       Excl. in GC?        Constitutional: Vital signs reviewed. Well appearing, well hydrated, well perfused, non-toxic appearing, no apparent distress  Head:  Normocephalic, atraumatic  Eyes: PERRL, normal conjunctiva bilaterally, EOMI  ENT: Mucous membranes moist.  .  Neck: Normal range of motion. Non-tender.   Respiratory/Chest: clear to auscultation. No work of breathing. No tachypnea..  Cardiovascular: Regular rate and rhythm. No murmur.   Abdomen: Soft and nontender in all quadrants. No guarding or rebound. No masses or hepatosplenomegaly.Marland Kitchen  UpperExtremity: No edema or cyanosis.  Neurological: Awake and alert. No focal motor deficits by observation. Pt with FROM in legs bilaterally noted limited active leg raises bilaterally.  Notes left sensory deficits. No facial droop. No pronator drift.   Skin: Warm and dry. No rash.  Lymphatic: No cervical lymphadenopathy.    MEDICAL DECISION MAKING   71 y.o. female hx of HTN, HPL who presents with left sided numbness and BLE weakness    Atypical presentation of CVA  Stat CT head, labs, neurology consult     Pt with prior hx of chest pain- EKG with no acute ST changes. Will obtain troponin, low concern for ACS     Reassessment/Updates:     Discussed with Dr. Francesco Sor, on-call neurology:  CT head negative   Consider disc disorder- will obtain MRI brain and cervical spine   Consider vascular etiology given hx of breast CA. CTA head/neck pending     3:56P  - discussed with Dr. Nyra Capes. Will admit to [facility] service   tPA considered, however pt outside of window      DISCUSSION        Vital Signs: Reviewed the patient?s vital signs.  Nursing Notes: Reviewed and utilized available nursing notes.  Medical Records Reviewed: Reviewed available past medical records.  Counseling: The emergency provider has spoken with the patient and discussed today?s findings, in addition to providing specific details for the  plan of care.  Questions are answered and there is agreement with the plan.      CARDIAC STUDIES    The following cardiac studies were independently interpreted by the Emergency Medicine Physician.  For full cardiac study results please see chart.    Monitor Strip  Interpreted by ED Physician  Rate: 60  Rhythm: NSR   ST Changes: none    EKG Interpretation:  Signed and interpreted by ED Physician   Time Interpreted:14:09  Comparison: 01/18/17  Rate: 64  Rhythm: NSR  Axis: normal  Intervals: normal  Blocks: none  ST segments: no acute ST changes  Interpretation: nonspecific EKG    IMAGING STUDIES    The following imaging studies were independently interpreted by the Emergency Medicine Physician.  For full imaging study results please see chart.      PULSE OXIMETRY    Oxygen Saturation by Pulse Oximetry: 99%  Interventions: none  Interpretation:  Normal     EMERGENCY DEPT. MEDICATIONS      ED Medication Orders     Start Ordered     Status Ordering Provider    05/26/17 1603 05/26/17 1602  aspirin tablet 325 mg  Daily     Route: Oral  Ordered Dose: 325 mg     Last MAR action:  Given ADJEI-TWUM, Timmey Lamba          LABORATORY RESULTS    Ordered and independently interpreted AVAILABLE laboratory tests. Please see results section in chart for full details.  Results for orders placed or performed during the hospital encounter of 05/26/17   CBC with differential   Result Value Ref Range    WBC 7.10 3.50 - 10.80 x10 3/uL    Hgb 11.6 (L) 12.0 - 16.0 g/dL    Hematocrit 16.1 (L) 37.0 - 47.0 %    Platelets 303 140 - 400 x10 3/uL    RBC 4.31 4.20 - 5.40 x10 6/uL    MCV 85.4 80.0 - 100.0 fL    MCH 26.9 (L) 28.0 - 32.0 pg    MCHC 31.5 (L) 32.0 - 36.0 g/dL    RDW 14 12 - 15 %    MPV 11.2 9.4 - 12.3 fL    Neutrophils 60.7 None %    Lymphocytes Automated 30.6 None %    Monocytes 6.9 None %    Eosinophils Automated 1.1 None %    Basophils Automated 0.4 None %    Immature Granulocyte 0.3 None %    Nucleated RBC 0.0 0.0 - 1.0 /100 WBC     Neutrophils Absolute 4.31 1.80 - 8.10 x10 3/uL    Abs Lymph Automated 2.17 0.50 - 4.40 x10 3/uL    Abs Mono Automated 0.49 0.00 - 1.20 x10 3/uL    Abs Eos Automated 0.08 0.00 - 0.70 x10 3/uL    Absolute Baso Automated 0.03 0.00 - 0.20 x10 3/uL    Absolute Immature Granulocyte 0.02 0 x10 3/uL    Absolute NRBC 0.00 0 x10 3/uL   Comprehensive Metabolic Panel (CMP)   Result Value Ref Range    Glucose 113 (H) 70 - 100 mg/dL    BUN 10 7 - 19 mg/dL    Creatinine 0.8 0.6 - 1.0 mg/dL    Sodium 096 045 -  145 mEq/L    Potassium 4.2 3.5 - 5.1 mEq/L    Chloride 108 100 - 111 mEq/L    CO2 28 22 - 29 mEq/L    Calcium 9.8 7.9 - 10.2 mg/dL    Protein, Total 6.7 6.0 - 8.3 g/dL    Albumin 3.9 3.5 - 5.0 g/dL    AST (SGOT) 21 5 - 34 U/L    ALT 12 0 - 55 U/L    Alkaline Phosphatase 77 37 - 106 U/L    Bilirubin, Total 0.4 0.2 - 1.2 mg/dL    Globulin 2.8 2.0 - 3.6 g/dL    Albumin/Globulin Ratio 1.4 0.9 - 2.2    Anion Gap 8.0 5.0 - 15.0   Troponin I   Result Value Ref Range    Troponin I <0.01 0.00 - 0.09 ng/mL   GFR   Result Value Ref Range    EGFR >60.0    Protime - INR   Result Value Ref Range    PT 13.2 12.6 - 15.0 sec    PT INR 1.0 0.9 - 1.1    PT Anticoag. Given Within 48 hrs. None    APTT   Result Value Ref Range    PTT 33 23 - 37 sec   Glucose Whole Blood - POCT   Result Value Ref Range    POCT - Glucose Whole blood 113 (H) 70 - 100 mg/dL   ECG 12 lead   Result Value Ref Range    Ventricular Rate 64 BPM    Atrial Rate 64 BPM    P-R Interval 160 ms    QRS Duration 88 ms    Q-T Interval 372 ms    QTC Calculation (Bezet) 383 ms    P Axis 44 degrees    R Axis 41 degrees    T Axis 20 degrees       \  DIAGNOSIS      Diagnosis:  Final diagnoses:   Leg weakness, bilateral   Left sided numbness       Disposition:  ED Disposition     ED Disposition Condition Date/Time Comment    Observation  Fri May 26, 2017  3:56 PM Admitting Physician: Theresia Lo [[id number]]   Diagnosis: Leg weakness, bilateral [119147]   Estimated Length of Stay: < 2  midnights   Tentative Discharge Plan?: Home or Self Care [1]   Patient Class: Observation [104]            Prescriptions:  Patient's Medications   New Prescriptions    No medications on file   Previous Medications    ACETAMINOPHEN (TYLENOL) 500 MG TABLET    Take 500 mg by mouth as needed for Pain.    ANASTROZOLE (ARIMIDEX) 1 MG TABLET    Take 1 tablet (1 mg total) by mouth daily.    ASPIRIN 81 MG EC TABLET    Take 81 mg by mouth daily.    IBUPROFEN (ADVIL,MOTRIN) 600 MG TABLET    Take 1 tablet (600 mg total) by mouth every 6 (six) hours as needed for Pain.for up to 30 doses    LEVOTHYROXINE (SYNTHROID, LEVOTHROID) 25 MCG TABLET    Take 25 mcg by mouth every morning.        LISINOPRIL (PRINIVIL,ZESTRIL) 20 MG TABLET    Take 1 tablet (20 mg total) by mouth daily.    MULTIPLE VITAMINS-MINERALS (MULTIVITAMIN WITH MINERALS) TABLET    Take 1 tablet by mouth daily.    OMEPRAZOLE (PRILOSEC) 10  MG CAPSULE    Take 2 capsules (20 mg total) by mouth daily.    SIMVASTATIN (ZOCOR) 40 MG TABLET    Take 40 mg by mouth every morning.        VITAMIN B-1 (THIAMINE) 100 MG TABLET    Take 100 mg by mouth every morning.   Modified Medications    No medications on file   Discontinued Medications    No medications on file            Rulon Abide, MD  05/26/17 1708

## 2017-05-26 NOTE — ED Notes (Signed)
Bed: E07  Expected date:   Expected time:   Means of arrival:   Comments:

## 2017-05-26 NOTE — ED Triage Notes (Addendum)
Patient reports to the ED with tingling to the left arm and left leg that started yesterday evening. She also reports left sided chest pressure that started yesterday. Denies SOB or nausea. She says that when she woke up this morning she was unable to lift either leg. Patient ambulatory in triage.

## 2017-05-26 NOTE — Progress Notes (Signed)
Scheduled rx given, pt leaving unit via bed to u/s in stable condition.

## 2017-05-26 NOTE — Plan of Care (Signed)
Problem: Day of Admission - Stroke  Goal: Core/Quality measure requirements - Admission  Outcome: Progressing   05/26/17 1848   Goal/Interventions addressed this shift   Core/Quality measure requirements - Admission Document NIH Stroke Scale on admission;Document nursing swallow/dysphagia screen on admission. If patient fails, keep patient NPO (follow your hospital protocol on swallowing screening).;Ensure antithrombotic administered or contraindication documented by LIP;VTE Prevention: Ensure anticoagulant(s) administered and/or anti-embolism stockings/devices documented as ordered;If diagnosis or history of Atrial Fib/Atrial Flutter, ensure oral anticoagulation is initiated or contraindication documented by LIP;Ensure lipid panel ordered;Begin stroke education on admission (must include Modifiable Risk Factors, Warning Signs and Symptoms of Stroke, Activation of Emergency Medical System and Follow-up Appointments) Ensure handout has been given and documented.;Ensure PT/OT and/or SLP ordered       Comments: Pt onto unit from ED to r/o stroke. NIH:1 upon admission. Dysphagia screen passed and diet started per md. ASA given in ED, lovenox given, statin given. MRI pending transport. [place] complete. bp high but allowed per permissive hypertension, wctm.

## 2017-05-26 NOTE — Progress Notes (Signed)
71 Y/O/F PRESENTS W/ NEW ONSET OF LEFT ARM/LEG TINGLING, SENSATION OF BIL LEG HEAVINESS. NEURO ON BOARD. ONCE MEDICALLY STABLE DCP PENDING PT/OT EVAL.

## 2017-05-26 NOTE — H&P (Addendum)
ADMISSION HISTORY AND PHYSICAL EXAM    [facility], DIVISION OF HOSPITALIST MEDICINE   [facility]   [place] Pager: [phone]      Date Time: 05/26/17 5:14 PM  Patient Name: Emily [name]  Attending Physician: Theresia Lo, MD  Primary Care Physician: Ramon Dredge, MD    CC: Lower extremity weakness and numbness and arm weakness    Assessment:     Active Hospital Problems    Diagnosis   . Leg weakness, bilateral   . Arm paresthesia, left   . Malignant neoplasm of overlapping sites of right female breast   . Essential hypertension   ?  Thyroid nodule  Plan:   -Admit to [facility] Hospitalist service    Bilateral lower extremity paresthesias as well as weakness and left arm paresthesia--symptoms not sounding consistent with acute CVA but will rule out intracranial process  -Check MRI of the brain  -Check MRI of the C-spine  -Neurology input appreciated  -Neurochecks  -Continue aspirin and statin, check lipid panel and hemoglobin A1c  -PT OT evaluation    Hypertension--we will allow permissive hypertension for now while ruling out CVA; will hold preadmission medications but will have prn hydralazine    History of breast cancer and on home Arimidex--continue Arimidex if CVA ruled out    ?  Thyroid nodule--check TFT's, check thyroid US    Plan of care discussed with patient, all questions answered  Disposition:     Anticipated medical stability for discharge: Possibly 1 day  Service status: Observation: Due to Possible discharge in less than 2 midnights    History of Presenting Illness:   Emily [name] is a 71 y.o. female with past medical history including breast cancer, hypertension, hyperlipidemia who presents to the hospital with roughly 1 day of paresthesia symptoms involving the legs and upper extremity.  The patient states throughout the day on 8/23 she had some paresthesias with heaviness and numbness of her lower extremities.  The symptoms progressed throughout the day.  She additionally  reports some paresthesias of the left arm and may be some weakness generally in both arms as well.  She states today she had difficulty ambulating because of her symptoms in her lower extremities.  She reports some chronic neck and back pain.  She denies any fevers or chills.  She states she always feels like she has a heavy tongue when speaking with feels that this may be exacerbated at this time.    Past Medical History:     Past Medical History:   Diagnosis Date   . Breast cancer 2014    right breast mastectomy   . Hyperlipidemia    . Hypertension    . Malignant neoplasm of overlapping sites of right female breast 10/19/2015       Available old records reviewed, including: EPIC    Past Surgical History:     Past Surgical History:   Procedure Laterality Date   . HYSTERECTOMY     . MASTECTOMY Right 2015       Family History:     Family History   Problem Relation Age of Onset   . Breast cancer Neg Hx        Social History:     History   Smoking Status   . Never Smoker   Smokeless Tobacco   . Never Used     History   Alcohol Use   . Yes     Comment: socially  History   Drug use: Unknown       Allergies:     Allergies   Allergen Reactions   . Morphine Itching       Medications:     Home Medications     Med List Status:  Pharmacy Completed Set By: Santiago Bur at 05/26/2017  2:50 PM                acetaminophen (TYLENOL) 500 MG tablet     Take 500 mg by mouth as needed for Pain.     anastrozole (ARIMIDEX) 1 MG tablet     Take 1 tablet (1 mg total) by mouth daily.     aspirin 81 MG EC tablet     Take 81 mg by mouth daily.     ibuprofen (ADVIL,MOTRIN) 600 MG tablet     Take 1 tablet (600 mg total) by mouth every 6 (six) hours as needed for Pain.for up to 30 doses     levothyroxine (SYNTHROID, LEVOTHROID) 25 MCG tablet     Take 25 mcg by mouth every morning.         lisinopril (PRINIVIL,ZESTRIL) 20 MG tablet     Take 1 tablet (20 mg total) by mouth daily.     Multiple Vitamins-Minerals (MULTIVITAMIN WITH MINERALS)  tablet     Take 1 tablet by mouth daily.     omeprazole (PRILOSEC) 10 MG capsule     Take 2 capsules (20 mg total) by mouth daily.     simvastatin (ZOCOR) 40 MG tablet     Take 40 mg by mouth every morning.         vitamin B-1 (THIAMINE) 100 MG tablet     Take 100 mg by mouth every morning.          Method by which medications were confirmed on admission: Reviewed with patient on admission      Review of Systems:   All other systems were reviewed and are negative except:as above in HPI     Physical Exam:   Patient Vitals for the past 24 hrs:   BP Temp Temp src Pulse Resp SpO2 Height Weight   05/26/17 1701 174/73 98.2 F (36.8 C) Oral 60 18 99 % - -   05/26/17 1600 153/70 - - 60 18 99 % - -   05/26/17 1500 174/80 - - 68 18 99 % - -   05/26/17 1417 165/74 - - 66 17 99 % - -   05/26/17 1403 178/74 99 F (37.2 C) Oral 72 16 97 % 1.6 m (5\' 3" ) 78.6 kg (173 lb 4.5 oz)     Body mass index is 30.7 kg/m.  No intake or output data in the 24 hours ending 05/26/17 1714    General: awake; no acute distress.  HEENT: eomi, sclera anicteric, oropharynx clear without lesions, mucous membranes moist  Neck: supple, no JVD  Cardiovascular: Normal S1 and S2, no rubs or gallops  Lungs: clear to auscultation bilaterally, without wheezing, rhonchi, or rales  Abdomen: soft, non-tender, non-distended; normoactive bowel sounds, no rebound or guarding  Extremities: no cyanosis or edema  Neuro: alert, oriented x 3, cranial nerves 2-12 grossly intact, strength 5/5 in upper extremities bilaterally, question some mild weakness in left lower extremity when lifting leg off the table but otherwise appearing 5 out of 5   Skin: no rashes or lesions noted      Labs:     Results     Procedure Component  Value Units Date/Time    Comprehensive Metabolic Panel (CMP) [[id number]]  (Abnormal) Collected:  05/26/17 1429    Specimen:  Blood Updated:  05/26/17 1518     Glucose 113 (H) mg/dL      BUN 10 mg/dL      Creatinine 0.8 mg/dL      Sodium 034 mEq/L       Potassium 4.2 mEq/L      Chloride 108 mEq/L      CO2 28 mEq/L      Calcium 9.8 mg/dL      Protein, Total 6.7 g/dL      Albumin 3.9 g/dL      AST (SGOT) 21 U/L      ALT 12 U/L      Alkaline Phosphatase 77 U/L      Bilirubin, Total 0.4 mg/dL      Globulin 2.8 g/dL      Albumin/Globulin Ratio 1.4     Anion Gap 8.0    GFR [[id number]] Collected:  05/26/17 1429     Updated:  05/26/17 1518     EGFR >60.0    Troponin I [[id number]] Collected:  05/26/17 1429    Specimen:  Blood Updated:  05/26/17 1515     Troponin I <0.01 ng/mL     APTT [[id number]] Collected:  05/26/17 1429     Updated:  05/26/17 1456     PTT 33 sec     Glucose Whole Blood - POCT [[id number]]  (Abnormal) Collected:  05/26/17 1450     Updated:  05/26/17 1455     POCT - Glucose Whole blood 113 (H) mg/dL     Protime - INR [[id number]] Collected:  05/26/17 1429    Specimen:  Blood Updated:  05/26/17 1455     PT 13.2 sec      PT INR 1.0     PT Anticoag. Given Within 48 hrs. None    CBC with differential [[id number]]  (Abnormal) Collected:  05/26/17 1429    Specimen:  Blood from Blood Updated:  05/26/17 1441     WBC 7.10 x10 3/uL      Hgb 11.6 (L) g/dL      Hematocrit 76.2 (L) %      Platelets 303 x10 3/uL      RBC 4.31 x10 6/uL      MCV 85.4 fL      MCH 26.9 (L) pg      MCHC 31.5 (L) g/dL      RDW 14 %      MPV 11.2 fL      Neutrophils 60.7 %      Lymphocytes Automated 30.6 %      Monocytes 6.9 %      Eosinophils Automated 1.1 %      Basophils Automated 0.4 %      Immature Granulocyte 0.3 %      Nucleated RBC 0.0 /100 WBC      Neutrophils Absolute 4.31 x10 3/uL      Abs Lymph Automated 2.17 x10 3/uL      Abs Mono Automated 0.49 x10 3/uL      Abs Eos Automated 0.08 x10 3/uL      Absolute Baso Automated 0.03 x10 3/uL      Absolute Immature Granulocyte 0.02 x10 3/uL      Absolute NRBC 0.00 x10 3/uL         EKG normal sinus at 64 bpm with no ST changes  Imaging personally reviewed, including: all available  Ct Angio Head/neck With/without Contrast    Result Date:  05/26/2017  1. No major branch occlusion is identified CT angiogram of the head.  2. There is not evidence of flow-limiting stenosis in the common carotid arteries or internal carotid arteries. 3. There is an 8 mm low-density focus in the anterior aspect of the right lobe of the thyroid gland. This is incompletely evaluated in this study. This can be further characterized sonographically. Tana Felts, MD 05/26/2017 4:53 PM    Ct Head Wo Contrast    Result Date: 05/26/2017   No CT evidence of acute intracranial pathology. Nira Retort, MD 05/26/2017 3:22 PM    Chest Ap Portable    Result Date: 05/26/2017   Mild linear opacity at the right base compatible with nodular scarring unchanged since CT of 12-05-16 Laurena Slimmer, MD 05/26/2017 2:42 PM      Safety Checklist  DVT prophylaxis:  CHEST guideline (See page e199S) [employer]     This note was generated by the Epic EMR system/ Dragon speech recognition and may contain inherent errors or omissions not intended by the user. Grammatical errors, random word insertions, deletions and pronoun errors  are occasional consequences of this technology due to software limitations. Not all errors are caught or corrected. If there are questions or concerns about the content of this note or information contained within the body of this dictation they should be addressed directly with the author for clarification.    Signed by: Randal Buba, MD   VW:[id number], Janet Berlin, MD

## 2017-05-27 LAB — T3, FREE: T3, Free: 1.74 pg/mL (ref 1.71–3.71)

## 2017-05-27 LAB — LIPID PANEL
Cholesterol / HDL Ratio: 4.9
Cholesterol: 246 mg/dL — ABNORMAL HIGH (ref 0–199)
HDL: 50 mg/dL (ref 40–9999)
LDL Calculated: 172 mg/dL — ABNORMAL HIGH (ref 0–99)
Triglycerides: 119 mg/dL (ref 34–149)
VLDL Calculated: 24 mg/dL (ref 10–40)

## 2017-05-27 LAB — HEMOGLOBIN A1C
Average Estimated Glucose: 119.8 mg/dL
Hemoglobin A1C: 5.8 % (ref 4.6–5.9)

## 2017-05-27 LAB — ECG 12-LEAD
Atrial Rate: 64 {beats}/min
P Axis: 44 degrees
P-R Interval: 160 ms
Q-T Interval: 372 ms
QRS Duration: 88 ms
QTC Calculation (Bezet): 383 ms
R Axis: 41 degrees
T Axis: 20 degrees
Ventricular Rate: 64 {beats}/min

## 2017-05-27 LAB — T4, FREE: T4 Free: 0.96 ng/dL (ref 0.70–1.48)

## 2017-05-27 LAB — HEMOLYSIS INDEX: Hemolysis Index: 6 (ref 0–18)

## 2017-05-27 LAB — TSH: TSH: 1 u[IU]/mL (ref 0.35–4.94)

## 2017-05-27 NOTE — Plan of Care (Signed)
Problem: Every Day - Stroke  Goal: Neurological status is stable or improving   05/27/17 0146   Goal/Interventions addressed this shift   Neurological status is stable or improving Monitor/assess/document neurological assessment (Stroke: every 4 hours);Monitor/assess NIH Stroke Scale;Re-assess NIH Stroke Scale for any change in status;Observe for seizure activity and initiate seizure precautions if indicated;Perform CAM Assessment   Patient is on neuro checks every  4 hours, blood pressure,  Labs  Being monitored. Patient is SR on the cardiac monitor.The learning abilities of the patient and/or caregiver have been assessed. Today's individualized plan of care includes neuro  Checks every  4 hours , intake and out put, monitoring  Labs and heart rate                                    The patient or caregiver states the following personal goal related to the patient's deficit(s): "By discharge I want to be able to walk The plan of care was discussed with the patient and/or caregiver, who agrees to it and demonstrates understanding of the disease process, risk factors, treatment plan, medications and consequences of noncompliance. All questions and concerns were addressed.

## 2017-05-27 NOTE — Progress Notes (Addendum)
Writer met with pt and her granddtr(Lorraine) to discuss her [place] this afternoon, she agreed to have [facility] services for PT and Clinical research associate will arrange it.    Pt's granddtr Karin Golden) will transport home , she is at the bedside.    Loralye Loberg A. Elnor Renovato, MSW

## 2017-05-27 NOTE — Progress Notes (Signed)
Progress Note    Date Time: 05/27/17 1:43 PM  Patient Name: Emily [name]  Attending Physician: Randal Buba*      Assessment & Plan:   71 yo female with Hx breast CA presenting with L arm paresthesias but also bil LE heaviness.  Pt feels symptoms have for the most part resolved overnight but is now c/o sharp pain L abdomen and constipation.    MRI brain and MRI cervical spine without significant findings.    Orthostatics were normal.    -  Ok to discharge if echo negative.  -  Etiology for symptoms is not clear but reasonable to remain on ASA as preventative.  -  Discussed with Dr. Elise Benne        Subjective:   Patient Seen and Examined. The notes from the last 24 hours were reviewed.     Review of Systems:   No headache, eye, ear nose, throat problems; no coughing or wheezing or shortness of breath, No chest pain or orthopnea, no abdominal pain, nausea or vomiting, No pain in the body or extremities, no psychiatric, neurological, endocrine, hematological or cardiac complaints except as noted above.     Physical Exam:   Blood pressure 155/71, pulse (!) 54, temperature 97.7 F (36.5 C), temperature source Oral, resp. rate 18, height 1.6 m (5\' 3" ), weight 74 kg (163 lb 3.2 oz), SpO2 98 %.    HEENT: Normocephalic. No icter or congestion  Neck: supple, no lymphadenopathy, no thyromegaly, no JVD  Extremities: no clubbing, cyanosis, or edema  Skin: no rashes or lesions noted    Neuro:  Level of consciousness:  Alert and appropriate  Facial Movements: symmetric  Strength:  No upper extremity drift  Sensation to light touch: Intact bilaterally      Meds:      Scheduled Meds: PRN Meds:      aspirin 81 mg Oral Daily   enoxaparin 40 mg Subcutaneous Daily   levothyroxine 25 mcg Oral Daily at 0600   pantoprazole 40 mg Oral QAM AC   simvastatin 40 mg Oral QAM   thiamine 100 mg Oral QAM       Continuous Infusions:     acetaminophen 650 mg Q6H PRN   hydrALAZINE 10 mg Q3H PRN   ondansetron 4 mg Q6H PRN   Or      ondansetron 4 mg Q6H PRN           I personally reviewed all of the medications    Labs:     Recent Labs  Lab 05/26/17  1429   Glucose 113*   BUN 10   Creatinine 0.8   Calcium 9.8   Sodium 144   Potassium 4.2   Chloride 108   CO2 28   Albumin 3.9   AST (SGOT) 21   ALT 12   Bilirubin, Total 0.4   Alkaline Phosphatase 77       Recent Labs  Lab 05/26/17  1429   WBC 7.10   Hgb 11.6*   Hematocrit 36.8*   MCV 85.4   MCH 26.9*   MCHC 31.5*   Platelets 303         Recent Labs      05/26/17   1429   PTT  33   PT  13.2   PT INR  1.0          Radiology Results (24 Hour)     Procedure Component Value Units Date/Time    [place] Head Neck Soft  Tissue [[id number]] Collected:  05/27/17 0859    Order Status:  Completed Updated:  05/27/17 0912    Narrative:       THYROID ULTRASOUND    CLINICAL INDICATION: ?thyroid nodule    COMPARISON: Head and neck CTA, same date chest CT 04/22/2014, cervical  MRI 05/01/2004    FINDINGS:    The thyroid gland is normal in size and echogenicity. The right lobe  measures     cm. The left lobe measures 4.7 x 2.3 x 2.4 cm. Isthmus  measures 4.6 x 2.2 x 1.4 cm. Multiple thyroid nodules are present as  below.    Right thyroid lobe:    Inferior pole, 1.5 x 1.2 x 1.1 cm, predominantly solid, hypoechoic  ,wider than tall , smooth margin,  no microcalcification. Grossly  unchanged from MRI 05/01/2004 examination.    Left thyroid lobe:    Mid pole, 0.6 x 0.5 x 0.5 cm, predominantly solid,isoechoic ,wider than  tall , smooth margin,  peripheral opacifications. Grossly unchanged from  MRI 05/01/2004 examination.      Impression:           Bilateral thyroid nodules, stable compared to 05/01/2004 examination, are  compatible benign nodules.    Max Fickle, MD   05/27/2017 9:08 AM    MRI C-Spine with/without Contrast [[id number]] Collected:  05/26/17 2217    Order Status:  Completed Updated:  05/26/17 2233    Narrative:       MRI CERVICAL SPINE W WO CONTRAST    CLINICAL INDICATION:   SPINAL CORD INJURY bilateral leg  weakness    TECHNIQUE:Multisequence multi planar MR images obtained of the cervical  spine prior to and following intravenous administration of 7.5 mL  Gadavist contrast    COMPARISON:    MRI cervical spine from 05/01/2004    FINDINGS:       There is a focus of hyperintense T2 signal in the  anterior aspect of the right lobe of the thyroid gland that measures  11.8 mm. This appears similar to the prior study.  Visualized portion of the brain and spinal cord have normal signal  intensity in T1 and T2-weighted sequences. No abnormal enhancing lesions  are seen following contrast administration.  No focal lesions are seen in the bone marrow.  Disc desiccation is noted. There is associated loss of intervertebral  disc height.  At C2-3, there is no focal disc protrusion, foraminal narrowing or canal  stenosis.  At C3-4, there is posterior extension of disc osteophyte complex. There  is slight effacement of the anterior aspect of the spinal cord. There is  uncovertebral degenerative change. There is moderate to severe bilateral  foraminal narrowing.   At C4-5, there is slight posterior extension of disc osteophyte complex.  There is slight effacement of the anterior aspect of the spinal cord.  There is uncovertebral degenerative change. There is moderate to severe  bilateral foraminal narrowing.  At C5-6, there is slight posterior extension of disc osteophyte complex.  There is uncovertebral degenerative change. There is moderate to severe  bilateral foraminal narrowing.  At C6-7, there is posterior extension of disc osteophyte complex with  slight effacement of the anterior aspect of the spinal cord. There is  uncovertebral degenerative change. There is moderate bilateral foraminal  narrowing.  At C7-T1, there is no focal disc protrusion, foraminal narrowing or  canal stenosis.        Impression:           1.  Multilevel degenerative change is seen with associated foraminal  narrowing as above. This has progressed since  the prior study.    2.  No focal lesions are seen in the visualized portion of the brain or  spinal cord.    3.  Focus of hyperintense T2 signal is noted in the anterior aspect of  the thyroid gland. This appears similar to the prior study from  05/01/2004.            Tana Felts, MD   05/26/2017 10:28 PM    MRI Brain With / Without Contrast [[id number]] Collected:  05/26/17 2210    Order Status:  Completed Updated:  05/26/17 2220    Narrative:       MRI BRAIN W WO CONTRAST    CLINICAL INDICATION:   TIA OR CVA LE weakness    TECHNIQUE: Multisequence multiplanar MR images obtained of the brain  prior to and following intravenous administration of 7.5 mL Gadavist  contrast    COMPARISON: CT scan of the head from 05/26/2017    FINDINGS: There is no mass effect or midline shift. There are no  abnormal intra or extra-axial fluid collections. Cortical sulci and  lateral ventricles are within normal limits for size and configuration.  The cerebellum and brainstem appear normal. There are foci of  hyperintense FLAIR signal in the white matter of the cerebral  hemispheres. Diffusion-weighted imaging does not show definite evidence  of acute or subacute infarct. No abnormal enhancing lesions are seen in  the brain following contrast administration.  No gross abnormality is seen in the orbits.  Paranasal sinuses appear clear.  The craniocervical junction appears normal.      Impression:           1.  Diffusion weighted imaging does not show definite evidence of acute  or subacute infarct.    2.  There are foci of hyperintense FLAIR signal noted in the white  matter of the cerebral hemispheres. This is a nonspecific finding. It  may be related to chronic ischemic change from small vessel disease.    3.  No abnormal seen lesions are seen in the brain following contrast  administration.            Tana Felts, MD   05/26/2017 10:16 PM    CT Angio Head/Neck with/without Contrast [[id number]] Collected:  05/26/17 1641    Order  Status:  Completed Updated:  05/26/17 1657    Narrative:       CT ANGIOGRAM HEAD NECK    CLINICAL INDICATION:   TIA OR CVA lower extremity numbness    TECHNIQUE:   Serial axial images through the neck and head during  intravenous administration of 100 mL Omnipaque 350 contrast with  coronal, sagittal, axial and oblique reconstructions including MIP  reconstructions    Note that CT scanning at this site utilizes multiple dose reduction  techniques including automatic exposure control, adjustment of the MAA  and/or KVP according to the patient's size and use of iterative  reconstruction technique.    COMPARISON:    Noncontrast head CT 05/26/2017    FINDINGS:       CT angiogram head: There is mild atrophy. There is no  mass effect or midline shift. Lateral ventricles are normal in size and  configuration. Basilar cisterns appear patent.  The vertebrobasilar system is patent. No focal stenotic lesion is  identified. Posterior cerebral arteries are patent bilaterally. There is  a posterior communicating arteries seen  on the right side. The distal  internal carotid arteries, middle cerebral arteries and anterior  cerebral arteries are patent bilaterally. No major branch occlusion is  identified.  No acute osseous abnormality is identified.    CT angiogram neck: Orbits appear normal and symmetric.  Paranasal sinuses and mastoid air cells are well aerated  There are degenerative changes in the spine.  No focal consolidations are seen in the visualized portion of the lungs.  There is a low-density focus in the anterior aspect of the right lobe of  the thyroid gland that measures approximately 8  Submandibular glands and parotid glands appear normal.  The common carotid arteries and internal carotid arteries are patent  bilaterally. There is not evidence of flow-limiting stenosis. Vertebral  arteries are patent bilaterally. No focal stenotic lesions are  identified.      Impression:             1. No major branch occlusion is  identified CT angiogram of the head.    2. There is not evidence of flow-limiting stenosis in the common carotid  arteries or internal carotid arteries.  3. There is an 8 mm low-density focus in the anterior aspect of the  right lobe of the thyroid gland. This is incompletely evaluated in this  study. This can be further characterized sonographically.             Tana Felts, MD   05/26/2017 4:53 PM    CT Head WO Contrast [[id number]] Collected:  05/26/17 1518    Order Status:  Completed Updated:  05/26/17 1526    Narrative:       INDICATION: Left-sided tingling and weakness.    TECHNIQUE: Noncontrast helical CT of the head was performed. Multiplanar  reformations were provided.    COMPARISONS: CT of head dated 04/23/2016.    FINDINGS: No acute intracranial hemorrhage. No mass. Gray-white  differentiation is maintained. Cortical sulci and gyri are symmetric in  appearance. No hydrocephalus. No midline shift or evidence of  herniation. Limited CT evaluation of the orbits, globes, paranasal  sinuses, and mastoid air cells is unremarkable. The calvarium is intact.        Impression:        No CT evidence of acute intracranial pathology.    Nira Retort, MD   05/26/2017 3:22 PM    Chest AP Portable [[id number]] Collected:  05/26/17 1441    Order Status:  Completed Updated:  05/26/17 1446    Narrative:       History: CVA evaluation    Technique: Single Portable View    Comparison: 01/18/2017    Findings:  There is mild patchy linear airspace disease at the right base  There is no pneumothorax.  The heart is normal in size.    The mediastinum is within normal limits.             Impression:        Mild linear opacity at the right base compatible  with nodular scarring unchanged since CT of 12-05-16    Laurena Slimmer, MD   05/26/2017 2:42 PM           All recent brain and spine imaging (MRI, CT) personally reviewed.    Chart reviewed    Case discussed with: Dr. Elise Benne and patient    35 minutes; >50% time spent in counseling or  coordination of care    Signed by: Ardelle Anton, MD  Spectralink: [phone]  Answering Service: [phone]

## 2017-05-27 NOTE — OT Eval Note (Signed)
[facility]  [address]  [place], [place] [address]  [phone]    Occupational Therapy Evaluation    Patient: Emily [name] MRN: [id number]   Unit: [facility] INTERMEDIATE CARE Bed: MI625/MI625-02    Time of treatment: Time Calculation  OT Received On: 05/27/17  Start Time: 0900  Stop Time: 0922  Time Calculation (min): 22 min    Consult received for [name] for OT evaluation and treatment.  Patient's medical condition is appropriate for Occupational Therapy  intervention at this time.    D/C Suggestions   Home w/HHOT for home safety eval and DME needs    Transport Recommendations: no limitations    Assessment     Emily [name] is a 71 y.o. female admitted 05/26/2017.   Expanded chart review completed including review of imaging, review of vitals and review of past hospitalizations.  Pt's ability to complete ADLs and functional transfers is impaired due to the following deficits: decreased balance, pain and sensation deficits.  Pt demonstrates performance deficits with bathing, dressing, toileting and functional mobility. There are a few comorbidities or other factors that affect plan of care and require modification of task including: residual neurological symptoms, has stairs to manage and lives alone.  Pt would continue to benefit from OT to address these deficits and increase functional independence.        Complexity Chart Review Performance Deficits Clinical Decision Making Hx/Comorbidities Assistance needed   Moderate Expanded 3-5 Several Options 1-2 Min/Mod assist (not at baseline)       PMP - Progressive Mobility Protocol   PMP Activity: Step 1 - Bedrest;Step 6 - Walks in Room  Distance Walked (ft) (Step 6,7): 15 Feet     Rehabilitation Potential: good    Interdisciplinary Communication: discussed with PT/RN    Plan     OT Plan  Risks/Benefits/POC Discussed with Pt/Family: With patient  Treatment Interventions: ADL retraining;Functional transfer training;UE strengthening/ROM;Endurance  training;Patient/Family training;Equipment eval/education;Compensatory technique education;Neuro muscular reeducation;Fine motor coordination activities;Continued evaluation  Discharge Recommendation: Home with home health OT  OT Frequency Recommended: 3-4x/wk         Medical Diagnosis: Leg weakness, bilateral [R29.898]  Left sided numbness [R20.0]    History of Present Illness: Emily [name] is a 71 y.o. female admitted on  05/26/2017 with "BLE weakness"      Patient Active Problem List   Diagnosis   . Altered mental status   . Precordial pain   . Abnormal stress test   . Essential hypertension   . Other chest pain   . Malignant neoplasm of overlapping sites of right female breast   . Leg weakness, bilateral   . Arm paresthesia, left     Past Medical History:   Diagnosis Date   . Breast cancer 2014    right breast mastectomy   . Hyperlipidemia    . Hypertension    . Malignant neoplasm of overlapping sites of right female breast 10/19/2015     Past Surgical History:   Procedure Laterality Date   . HYSTERECTOMY     . MASTECTOMY Right 2015         X-Rays/Tests/Labs:  Lab Results   Component Value Date/Time    HGB 11.6 (L) 05/26/2017 02:29 PM    HCT 36.8 (L) 05/26/2017 02:29 PM    K 4.2 05/26/2017 02:29 PM    NA 144 05/26/2017 02:29 PM    INR 1.0 05/26/2017 02:29 PM    TROPI <0.01 05/26/2017 02:29 PM  TROPI <0.01 01/18/2017 02:02 PM    TROPI <0.01 09/12/2016 01:01 PM    TROPI <0.01 07/24/2016 12:03 PM    TROPI 0.01 07/15/2009 09:40 AM     Reviewed all recent imaging, see pt's chart for details.       Social History:  Lives alone in a apartment.  Entry Steps: 1 flight, no elevator access   Rails: yes Inside steps: 0  Rails: 0  Equipment at home:  tub/shower combination  Prior Level of Function:  indep all ADL/IADL, no DME/AE at home, cooks, drives    Cognition: indep     Subjective     Patient is agreeable to participation in the therapy session. Nursing clears patient for therapy.  Patient's Goal: not stated  Pain:  yes, left low back area and L hand, states she thinks this is arthritis related  Notified RN    Objective     Precautions:   Precautions  Weight Bearing Status: no restrictions  Other Precautions: falls    Patient is in bed with  Telemetry, Intravenous Access and Bed Alarm  in place.       Observation of patient/vitals: BP 177/73, HR 52  Vitals:    05/27/17 0400 05/27/17 0433 05/27/17 0500 05/27/17 0809   BP: 131/58 131/58  171/74   Pulse: (!) 59 (!) 57  (!) 53   Resp: 18 20  18    Temp: 97.5 F (36.4 C) 97.9 F (36.6 C)  97.7 F (36.5 C)   TempSrc: Oral Oral  Oral   SpO2: 99% 99%  99%   Weight:   74 kg (163 lb 3.2 oz)    Height:           Orientation/Cognition:     Alert and Oriented x 4  Cognition: wnl      Musculoskeletal Examination:   ROM Strength   RUE wnl wnl throughout   LUE wnl wnl throughout, limited in gross grasp d/t pain     Sensation: Intact to light touch, localization, proprioception throughout B UE's.   Coordination: Intact gross motor and serial opposition to B hands    Vision: wnl  Hearing: wnl      Functional Mobility:  Rolling: indep    Supine to sit: indep  Scooting: indep  Sit to Supine: indep  Sit to stand: spv  Stand to sit: spv  Transfers: cga d/t c/o pain in back  Ambulation: cga initially, progressing to spv    Balance:  Static Sit Balance: good  Dynamic Sit Balance: good  Static Stand Balance: good  Dynamic Stand Balance: fair    Self Care:  Eating: indep  LB Dressing: spv socks  Toileting: spv toileting, cga toilet transfer today      Endurance: good    Participation:  good    Education:  Educated the patient to role of occupational therapy, plan of care, goals  of therapy and safety with mobility and ADLs.    RN notified of session outcome and that patient was left in bed with all needs met and equipment intact.   Safety measures include: handoff to nurse/clin tech/ unit secretary completed, bed alarm activated, oriented to call bell and placed within reach, personal items within  reach, assistive device positioned out of reach and bed placed in lowest position.   Mobility and ADL status posted at bedside and within E.M.R.                Goals:  Goals  Goal Formulation:  Patient  Time For Goal Achievement: 2 visits  Goals: Select goal  Patient will dress lower body: Modified Independent;by time of discharge  Patient will toilet: Modified Independent;by time of discharge  Pt will perform functional transfers: Modified Independent;by time of discharge  Pt will transfer bed to toilet: Modified Independent;by time of discharge      Signature:   Lillia Carmel, OT  05/27/2017  10:40 AM  Phone: [phone]

## 2017-05-27 NOTE — Discharge Instr - AVS First Page (Addendum)
Reason for your Hospital Admission:  Weakness of lower extremities and some upper extremity paresthesias--no evidence for stroke on MRI of brain      Instructions for after your discharge:  Follow-up with PCP as outpatient including to arrange echocardiogram

## 2017-05-27 NOTE — Plan of Care (Signed)
Problem: Safety  Goal: Patient will be free from injury during hospitalization  Outcome: Progressing   05/27/17 1029   Goal/Interventions addressed this shift   Patient will be free from injury during hospitalization  Provide and maintain safe environment;Use appropriate transfer methods;Ensure appropriate safety devices are available at the bedside       Problem: Pain  Goal: Pain at adequate level as identified by patient  Outcome: Progressing   05/27/17 1029   Goal/Interventions addressed this shift   Pain at adequate level as identified by patient Assess for risk of opioid induced respiratory depression, including snoring/sleep apnea. Alert healthcare team of risk factors identified.;Assess pain on admission, during daily assessment and/or before any "as needed" intervention(s)       Problem: Every Day - Stroke  Goal: Neurological status is stable or improving  Outcome: Progressing   05/27/17 0146   Goal/Interventions addressed this shift   Neurological status is stable or improving Monitor/assess/document neurological assessment (Stroke: every 4 hours);Monitor/assess NIH Stroke Scale;Re-assess NIH Stroke Scale for any change in status;Observe for seizure activity and initiate seizure precautions if indicated;Perform CAM Assessment     Goal: Neurovascular status is stable or improving  Outcome: Progressing   05/27/17 1029   Goal/Interventions addressed this shift   Neurovascular status is stable or improving Monitor/assess neurovascular status (pulses, capillary refill, pain, paresthesia, presence of edema);Monitor/assess for signs of Venous Thrombus Emboli (edema of calf/thigh redness, pain);Monitor/assess site of invasive procedure for signs of bleeding       Problem: Moderate/High Fall Risk Score >5  Goal: Patient will remain free of falls  Outcome: Progressing   05/27/17 1003   OTHER   Moderate Risk (6-13) MOD-Consider activation of bed alarm if appropriate       Comments: The learning abilities of the  patient and/or caregiver have been assessed. Today's individualized plan of care includes Brain MRI, Neurology consult, and Neuro checks. The patient or caregiver states the following personal goal related to the patient's deficit(s): "By discharge I want to be able to take care of myself." The plan of care was discussed with the patient and/or caregiver, who agrees to it and demonstrates understanding of the disease process, risk factors, treatment plan, medications and consequences of noncompliance. All questions and concerns were addressed.

## 2017-05-27 NOTE — SLP Eval Note (Signed)
[facility]  [address]  [place], [place] [address]  [phone]    SPEECH LANGUAGE COGNITIVE SWALLOW EVALUATION    Patient: Emily [name]    MRN#: [id number]    Visit Number: 0    Consult received for [name] for SLP Evaluation and Treatment.    Referring Physician: Dr. Elam Dutch (ED) / Dr. Elise Benne (attending)      Date of Referral: 05/27/2017      Date/Time of Evaluation:   Time Calculation  SLP Received On: 05/27/17  Start Time: 0730  Stop Time: 0800  Time Calculation (min): 30 min  Total Treatment Time (min): 0    Assessment and Clinical Impression   KENIDI ELENBAAS is a 71 y.o. female admitted 05/26/2017 for  Leg weakness, bilateral [R29.898]  Left sided numbness [R20.0] presenting with speech, language, cognitive linguistic skills, and swallow WFL and at baseline. Pt denies facial paresthesias impacting motor speech or oral motor skills for swallowing. Orientation, problem solving, safety, recall, comprehension, and thought organization intact during this evaluation. Swallow is WFL. Pt passed 3 oz water test. No deficits in oral motor skills.     Plan   D/c SLP  Diet: regular/thin    Interdisciplinary Communication: RN         Medical Diagnosis: Leg weakness, bilateral [R29.898]  Left sided numbness [R20.0]    History of Present Illness:   SONNY POTH is a 71 y.o. female admitted on  05/26/2017 with Leg weakness, bilateral [R29.898]  Left sided numbness [R20.0]    Past Medical/Surgical History:  Past Medical History:   Diagnosis Date   . Breast cancer 2014    right breast mastectomy   . Hyperlipidemia    . Hypertension    . Malignant neoplasm of overlapping sites of right female breast 10/19/2015     Past Surgical History:   Procedure Laterality Date   . HYSTERECTOMY     . MASTECTOMY Right 2015       Social History: Patient lives alone in an apartment and is independent with all functional tasks.     Subjective   Patient is agreeable to participation in the therapy session. Patient's medical  condition is  appropriate for Speech therapy intervention at this time.  Patient's Goal:  I'm having some pain in my back.        Precautions: n/a    Objective   Completed SLP evaluation with informal measures.     Educated the patient to role of speech therapy, plan of care, goals of  therapy.    G Codes:     Swallowing G Code Set  Swallowing, Current Status (A2130): CH  Swallowing, Goal Status (Q6578): CH  Swallowing, D/C Status (I6962): CH  Tools used to determine level of impairment:: Clinical judgement                                     Other SLP G Code Set  Other Speech Language Pathology, Current Status [phone]): [facility]  Other Speech Language Pathology, Goal Status (X3244): CH  Other Speech Language Pathology, D/C Status (W1027): CH  Tools used to determine level of impairment:: Clinical judgement    Goals: n/a    Attention Dr. Elise Benne  Thank you for allowing [place] to participate in the care of your patient. Regulations from the Center for Medicare and Medicaid Services (CMS) require your review and approval of this  plan of care.     Please cosign the SLP Eval Note indicating you are in agreement with the SLP Plan of Care so we may initiate the therapy treatment plan.    Signature:  Gaylan Gerold, M.Ed CCC-SLP  Phone: [phone]  05/27/2017

## 2017-05-27 NOTE — Progress Notes (Signed)
[facility] UN[facility]  DISCHARGE NOTE      Date Time: 05/27/17 4:18 PM  Patient Name: Emily [name]  Attending Physician: Randal Buba*      Date of Admission:   05/26/2017        Discharge Instructions:        Patient stable for discharge. Discharge instructions given, patient teachings done and verbalized understanding. All questions answered at this time. Aware to follow-up and schedule appointments as necessary. IV access and cardiac monitor discontinued. Discharge to home with granddaughter. Patient on Aspirin 81 mg PO daily and Zocor 40 mg PO at HS.  Wheelchair  transport refused, patient wants to walk out of the hospital on her own.    Patient aware to follow-up with MD and PMD as needed.    Signed by: Fayrene Fearing

## 2017-05-27 NOTE — Discharge Summary (Signed)
DISCHARGE SUMMARY/PROGRESS NOTE  [facility], DIVISION OF HOSPITALIST MEDICINE   [place] [facility]     Date Time: 05/27/17 3:57 PM  Patient Name: Emily [name]  Attending Physician: Randal Buba*  Primary Care Physician: Ramon Dredge, MD    Date of Admission: 05/26/2017  Date of Discharge: 05/27/2017    Outcome of Hospitalization:   Principal Problem (Resolved):    Leg weakness, bilateral  Active Problems:    Essential hypertension    Malignant neoplasm of overlapping sites of right female breast  Resolved Problems:    Arm paresthesia, left     Significant Events: See H&P for full details regarding this patient's admission.  Briefly, the patient is a 71y.o. female with past medical history including breast cancer, hypertension, hyperlipidemia who presents to the hospital with roughly 1 day of paresthesia symptoms involving the legs and upper extremity and some lower extremity weakness as well.  The patient was seen by neurology.  She did have an MRI of the brain which was unremarkable with no evidence for stroke.  MRI of the cervical spine did reveal multilevel degenerative changes but no focal lesions.  The patient's symptoms did not seem consistent with a stroke but neurology was recommending an echocardiogram which can be done as an outpatient for completion of any workup.  Of note the patient did additionally have an ultrasound of her neck to evaluate thyroid nodules.  It was noted that the bilateral thyroid nodules have been stable compared to imaging back in 2005.    The patient is otherwise medically stable for discharge on 8/25.  She was recommended to continue on aspirin and statin.      Today:   BP 156/71   Pulse 65   Temp 97.4 F (36.3 C) (Oral)   Resp 18   Ht 1.6 m (5\' 3" )   Wt 74 kg (163 lb 3.2 oz)   SpO2 99%   BMI 28.91 kg/m   Ranges for the last 24 hours:  Temp:  [97.4 F (36.3 C)-98.3 F (36.8 C)] 97.4 F (36.3 C)  Heart Rate:  [52-65] 65  Resp Rate:   [18-20] 18  BP: (131-192)/(58-85) 156/71  General: Alert and awake, no distress  HEENT: Anicteric, moist mucous members  Neck: Supple, no JVD  CVS: Normal S1 and S2, no gallop or rub  Lungs: Clear to auscultation bilaterally, no rhonchi or wheezing  Abdomen: Soft, nontender  Extremities: No pedal edema, no cyanosis  Neuro: Alert and oriented 3, moving all extremities with strength 5 out of 5 and no focal deficits    Procedures performed:   Radiology: all results from this admission  Ct Angio Head/neck With/without Contrast    Result Date: 05/26/2017  1. No major branch occlusion is identified CT angiogram of the head.  2. There is not evidence of flow-limiting stenosis in the common carotid arteries or internal carotid arteries. 3. There is an 8 mm low-density focus in the anterior aspect of the right lobe of the thyroid gland. This is incompletely evaluated in this study. This can be further characterized sonographically. Tana Felts, MD 05/26/2017 4:53 PM    Ct Head Wo Contrast    Result Date: 05/26/2017   No CT evidence of acute intracranial pathology. Nira Retort, MD 05/26/2017 3:22 PM    Mri Brain With / Without Contrast    Result Date: 05/26/2017  1.  Diffusion weighted imaging does not show definite evidence of acute or subacute infarct.  2.  There are foci of hyperintense FLAIR signal noted in the white matter of the cerebral hemispheres. This is a nonspecific finding. It may be related to chronic ischemic change from small vessel disease. 3.  No abnormal seen lesions are seen in the brain following contrast administration. Tana Felts, MD 05/26/2017 10:16 PM    Mri C-spine With/without Contrast    Result Date: 05/26/2017  1.  Multilevel degenerative change is seen with associated foraminal narrowing as above. This has progressed since the prior study. 2.  No focal lesions are seen in the visualized portion of the brain or spinal cord. 3.  Focus of hyperintense T2 signal is noted in the anterior aspect of  the thyroid gland. This appears similar to the prior study from 05/01/2004. Tana Felts, MD 05/26/2017 10:28 PM    [place] Head Neck Soft Tissue    Result Date: 05/27/2017  Bilateral thyroid nodules, stable compared to 05/01/2004 examination, are compatible benign nodules. Max Fickle, MD 05/27/2017 9:08 AM    Chest Ap Portable    Result Date: 05/26/2017   Mild linear opacity at the right base compatible with nodular scarring unchanged since CT of 12-05-16 Laurena Slimmer, MD 05/26/2017 2:42 PM    Surgery: all results from this admission  * No surgery found *  Lab Results last 48 Hours     Procedure Component Value Units Date/Time    Hemoglobin A1C [[id number]] Collected:  05/27/17 0602    Specimen:  Blood Updated:  05/27/17 1526     Hemoglobin A1C 5.8 %      Average Estimated Glucose 119.8 mg/dL     T4, free [[id number]] Collected:  05/27/17 0602    Specimen:  Blood Updated:  05/27/17 1512     T4 Free 0.96 ng/dL     T3, free [[id number]] Collected:  05/27/17 0602    Specimen:  Blood Updated:  05/27/17 1512     T3, Free 1.74 pg/mL     Lipid panel [[id number]]  (Abnormal) Collected:  05/27/17 0602    Specimen:  Blood Updated:  05/27/17 1449     Cholesterol 246 (H) mg/dL      Triglycerides 629 mg/dL      HDL 50 mg/dL      LDL Calculated 528 (H) mg/dL      VLDL Cholesterol Cal 24 mg/dL      CHOL/HDL Ratio 4.9    Hemolysis index [[id number]] Collected:  05/27/17 0602     Updated:  05/27/17 1449     Hemolysis Index 6    TSH [[id number]] Collected:  05/27/17 0602    Specimen:  Blood Updated:  05/27/17 0701     TSH 1.00 uIU/mL     CREATINE KINASE LEVEL (CK) [[id number]] Collected:  05/26/17 1429    Specimen:  Blood Updated:  05/26/17 1807     Creatine Kinase (CK) 93 U/L     Comprehensive Metabolic Panel (CMP) [[id number]]  (Abnormal) Collected:  05/26/17 1429    Specimen:  Blood Updated:  05/26/17 1518     Glucose 113 (H) mg/dL      BUN 10 mg/dL      Creatinine 0.8 mg/dL      Sodium 295 mEq/L      Potassium 4.2 mEq/L      Chloride 108  mEq/L      CO2 28 mEq/L      Calcium 9.8 mg/dL      Protein, Total 6.7 g/dL  Albumin 3.9 g/dL      AST (SGOT) 21 U/L      ALT 12 U/L      Alkaline Phosphatase 77 U/L      Bilirubin, Total 0.4 mg/dL      Globulin 2.8 g/dL      Albumin/Globulin Ratio 1.4     Anion Gap 8.0    GFR [[id number]] Collected:  05/26/17 1429     Updated:  05/26/17 1518     EGFR >60.0    Troponin I [[id number]] Collected:  05/26/17 1429    Specimen:  Blood Updated:  05/26/17 1515     Troponin I <0.01 ng/mL     APTT [[id number]] Collected:  05/26/17 1429     Updated:  05/26/17 1456     PTT 33 sec     Glucose Whole Blood - POCT [[id number]]  (Abnormal) Collected:  05/26/17 1450     Updated:  05/26/17 1455     POCT - Glucose Whole blood 113 (H) mg/dL     Protime - INR [[id number]] Collected:  05/26/17 1429    Specimen:  Blood Updated:  05/26/17 1455     PT 13.2 sec      PT INR 1.0     PT Anticoag. Given Within 48 hrs. None    CBC with differential [[id number]]  (Abnormal) Collected:  05/26/17 1429    Specimen:  Blood from Blood Updated:  05/26/17 1441     WBC 7.10 x10 3/uL      Hgb 11.6 (L) g/dL      Hematocrit 53.6 (L) %      Platelets 303 x10 3/uL      RBC 4.31 x10 6/uL      MCV 85.4 fL      MCH 26.9 (L) pg      MCHC 31.5 (L) g/dL      RDW 14 %      MPV 11.2 fL      Neutrophils 60.7 %      Lymphocytes Automated 30.6 %      Monocytes 6.9 %      Eosinophils Automated 1.1 %      Basophils Automated 0.4 %      Immature Granulocyte 0.3 %      Nucleated RBC 0.0 /100 WBC      Neutrophils Absolute 4.31 x10 3/uL      Abs Lymph Automated 2.17 x10 3/uL      Abs Mono Automated 0.49 x10 3/uL      Abs Eos Automated 0.08 x10 3/uL      Absolute Baso Automated 0.03 x10 3/uL      Absolute Immature Granulocyte 0.02 x10 3/uL      Absolute NRBC 0.00 x10 3/uL           Treatment Team:   Attending Provider: Randal Buba, MD    Disposition:   Disposition: Home or Self Care    Condition at Discharge:   good     Follow up Recommendations for Receiving Provider    Diet- Diet cardiac    1.   Unresulted Labs     None          Discharge Instructions:     Follow-up Information     your PCP. Schedule an appointment as soon as possible for a visit.    Why:  including to arrange outpatient echo                  Medication List  CONTINUE taking these medications    acetaminophen 500 MG tablet  Commonly known as:  TYLENOL     anastrozole 1 MG tablet  Commonly known as:  ARIMIDEX  Take 1 tablet (1 mg total) by mouth daily.     aspirin 81 MG EC tablet     ibuprofen 600 MG tablet  Commonly known as:  ADVIL,MOTRIN  Take 1 tablet (600 mg total) by mouth every 6 (six) hours as needed for Pain.for up to 30 doses     levothyroxine 25 MCG tablet  Commonly known as:  SYNTHROID, LEVOTHROID     lisinopril 20 MG tablet  Commonly known as:  PRINIVIL,ZESTRIL  Take 1 tablet (20 mg total) by mouth daily.     multivitamin with minerals tablet     omeprazole 10 MG capsule  Commonly known as:  PriLOSEC  Take 2 capsules (20 mg total) by mouth daily.     simvastatin 40 MG tablet  Commonly known as:  ZOCOR     vitamin B-1 100 MG tablet  Commonly known as:  THIAMINE          Signed by: Randal Buba, MD

## 2017-05-27 NOTE — PT Eval Note (Signed)
[facility]  [address]  [place], [place] [address]  [phone]    Physical Therapy Evaluation    Patient: Emily [name] MRN: [id number]   Unit: [facility] INTERMEDIATE CARE Bed: MI625/MI625-02    Time of Treatment: Time Calculation  PT Received On: 05/27/17  Start Time: 1145  Stop Time: 1208  Time Calculation (min): 23 min  Total Treatment Time (min): 23    Consult received for Emily [name] for PT evaluation and treatment.  Patient's medical condition is appropriate for Physical Therapy  intervention at this time.    D/C Suggestions   Recommendation  Discharge Recommendation: Home with supervision    Transport Recommendations: no limitations    Assessment   Emily [name] is a 71 y.o. female admitted 05/26/2017.  Pt's functional mobility is impacted by:  pain.  There are no comorbidities or other factors that affect plan of care and require modification of task including: lives alone.  Standardized tests and exams incorporated into evaluation include AMPAC mobility, balance, cognition/orientation, coordination, ROM  and Strength.  Pt demonstrates a stable clinical presentation, performed bed mobility, transfers and ambulation with SUP on level sufrace x19ft and stairs x10 without AD, demonstrated decreased cadence and step length and one instance of LOB d/t sharp L hip/low back pain otherwise demonstrates good dynamic balance and safety awareness. RN made aware of pt's disposition and c/o pain. Pt requires no skilled PT intervention at this time. Recommend D/C home with SUP once medically stable     Complexity Level Hx and Co  morbidites Examination Clinical Decision Making Clinical Presentation   Low no impact 1-2 elements Limited options Stable       PMP - Progressive Mobility Protocol   PMP Activity: Step 7 - Walks out of Room  Distance Walked (ft) (Step 6,7): 100 Feet     Rehabilitation Potential: N/A       Interdisciplinary Communication: Discussed with RN and OT      Plan    PT  Plan  Risks/Benefits/POC Discussed with Pt/Family: With patient  Treatment/Interventions: No skilled interventions needed at this time         Medical Diagnosis: Leg weakness, bilateral [R29.898]  Left sided numbness [R20.0]    History of Present Illness: Emily [name] is a 71 y.o. female admitted on  05/26/2017 with "BLE weakness".      Patient Active Problem List   Diagnosis   . Altered mental status   . Precordial pain   . Abnormal stress test   . Essential hypertension   . Other chest pain   . Malignant neoplasm of overlapping sites of right female breast     Past Medical History:   Diagnosis Date   . Breast cancer 2014    right breast mastectomy   . Hyperlipidemia    . Hypertension    . Malignant neoplasm of overlapping sites of right female breast 10/19/2015     Past Surgical History:   Procedure Laterality Date   . HYSTERECTOMY     . MASTECTOMY Right 2015         X-Rays/Tests/Labs:  Lab Results   Component Value Date/Time    HGB 11.6 (L) 05/26/2017 02:29 PM    HCT 36.8 (L) 05/26/2017 02:29 PM    K 4.2 05/26/2017 02:29 PM    NA 144 05/26/2017 02:29 PM    INR 1.0 05/26/2017 02:29 PM    TROPI <0.01 05/26/2017 02:29 PM    TROPI <0.01 01/18/2017 02:02  PM    TROPI <0.01 09/12/2016 01:01 PM    TROPI <0.01 07/24/2016 12:03 PM    TROPI 0.01 07/15/2009 09:40 AM       Social History:  Lives alone in a multilevel home.  Entry Steps: 10+   Rails: L Inside steps: 0  Rails: n/a  Equipment at home: none  Prior Level of Function:  (I)    Cognition: (I)    Mobility/Locomotion: (I)   Feeding: (I)   Grooming: (I)   Bathing: (I)   Dressing: (I)   Toileting: (I)    Subjective   Patient is agreeable to participation in the therapy session. Nursing clears patient for therapy.  Patient's Goal:  To go home  Pain: 8/10  Location: L hip/lowback  Therapist Intervention: positioned for comfort and RN notified of pt's request for pain medication  Patient is satisfied with therapist intervention.    Objective     Precautions/  Contraindications: Falls  Precautions  Weight Bearing Status: no restrictions  Other Precautions: falls    Patient is in bed with  Telemetry, Intravenous Access and Bed Alarm in place.    Observation of patient/vitals:  BP 155/71; HR 52bpm; SpO2: 95%    Vitals:    05/27/17 0500 05/27/17 0809 05/27/17 1154 05/27/17 1545   BP:  171/74 155/71 156/71   Pulse:  (!) 53 (!) 54 65   Resp:  18 18 18    Temp:  97.7 F (36.5 C)  97.4 F (36.3 C)   TempSrc:  Oral  Oral   SpO2:  99% 98% 99%   Weight: 74 kg (163 lb 3.2 oz)      Height:         Orientation/Cognition:  Alert and Oriented x 4  Cognition: intact    Musculoskeletal Examination:      ROM Strength   RLE WFL 4/5   LLE WFL 4-/5     Sensation: not tested  Coordination: WFL    Functional Mobility: with increased time  Supine to sit: Mod (I)  Scooting: Mod (I)  Sit to Supine: Mod (I)  Sit to stand: Mod (I)  Stand to sit: Mod (I)  Transfers: Mod (I)  Ambulation:     Weightbearing: no restriction    Assistance level: SUP   Distance: 176ft   Assistive Device: none   Gait Deviations: decreased gait speed   Stairs: 10 using hand rail    Balance:  Static Sit: good  Dynamic Sit: good  Static Stand: good  Dynamic Stand: good    Endurance: good    Participation:  good    Education:  Educated the patient to role of physical therapy, plan of care, goals  of therapy and HEP, safety with mobility and ADLs, home safety.    RN notified of session outcome and that patient was left in bed with all needs met and equipment intact.   Safety measures include: handoff to nurse/clin tech/ unit secretary completed, bed alarm activated, oriented to call bell and placed within reach, personal items within reach and bed placed in lowest position.   Mobility and ADL status posted at bedside and within E.M.R.    AM-PACT Inpatient Short Forms  Inpatient AM-PACT Performed? (PT): Basic Mobility Inpatient Short Form   AM-PACT "6 Clicks" Basic Mobility Inpatient Short Form  Turning Over in Bed:  None  Sitting Down On/Standing From Armchair: None  Lying on Back to Sitting on Side of Bed: None  Assist Moving to/from Bed to Chair:  None  Assist to Walk in Hospital Room: None  Assist to Climb 3-5 Steps with Railing: None  PT Basic Mobility Raw Score: 24  CMS 0-100% Score: 0.00%    G codes:        Mobility G Code Set  Mobility, Current Status (Z6109): 0 percent impaired, limited or restricted  Mobility, Goal Status (U0454): 0 percent impaired, limited or restricted  Mobility, D/C Status (U9811): 0 percent impaired, limited or restricted                      Patient's current impairment level is: Mobility, Current Status (B1478): CH    Patient is expected to achieve: Mobility, Goal Status (G9562): CH    Patient discharge impairments level is:Mobility, D/C Status (Z3086): CH      Therapy Diagnosis: Impaired Mobility              Signature:  Bertram Savin, PT  05/27/2017  5:04 PM  Phone: [phone]    Attention MD:   Thank you for allowing [place] to participate in the care of [facility]. Regulations from the Center for Medicare and Medicaid Services (CMS) require your review and approval of this plan of care.     Please cosign this note indicating you are in agreement with the PT Plan of Care so we may initiate the therapy treatment plan

## 2017-06-22 NOTE — Progress Notes (Signed)
ONCOLOGY HISTORY   Emily [name] is a 71 y.o. female with a history of right breast cancer, status post mastectomy, who presented to establish care in January 2017.  The patient was previously followed at [facility], but transferred care to be closer to her home in [place]..      Per records, in 2014, the patient underwent routine mammographic screening on 08/14/13, with subsequent stereotactic guided core biopsy on 09/02/2013.  Pathology report revealed an infiltrating ductal carcinoma 1.2 cm, grade 2 with associated DCIS.  The patient underwent right total mastectomy and sentinel node biopsy.  Invasive tumor profile was ER positive, PR positive, HER-2/neu negative, Ki-67 20%. Sentinel node was negative. The patient's postoperative course was complicated by an infection at the expander site. The expander ultimately had to be removed.  She has undergone a total of 2 surgeries, and has been unable to have reconstructive surgery since.    The patient was placed on endocrine therapy with an aromatase inhibitor (anastrozole), which she is tolerating fairly well.  She has had consistent follow-up and screening, with no evidence of cancer recurrence. She underwent left-sided mammogram in September 2016, which was negative.       The patient states that she continues to have some problems with memory loss, which she feels occurred after the surgical procedures.  She is unable to remember some of the details of her surgical course.  She reports that she has intermittent discomfort at the right mastectomy site that is alleviated by Tylenol, ibuprofen or ice packs.  The areas of skin on the lateral aspect catch on her clothing and is uncomfortable for her.  She notes that sometimes the pain is related to activities such as cooking and doing household chores. She has noted no mass or skin changes at the mastectomy site.  She otherwise denies active complaints.      Interval History 06/23/2017:  The patient presents for a  routine clinic follow-up visit.  She was last seen in our office on Feb 14, 2016.    She is tolerating the anastrozole well with no adverse side effects.  She is still considering seeking consultation with a Engineer, petroleum.      She reports persistent limited range of motion in the right upper extremity and a sensation of tightness when she raises her arm, but otherwise reports feeling well overall and denies active complaints.      She denies headache, lightheadedness, changes in vision, tinnitus, sore throat, cough, shortness of breath, chest pain, palpitations, abdominal pain, nausea, emesis, dysuria, changes in bowel habits, numbness/tingling, edema, fever/chills, fatigue, night sweats, unintentional weight loss, easy bruising, prolonged bleeding or evidence of active bleeding.      Allergies   Allergen Reactions   . Morphine Itching     Current Outpatient Prescriptions   Medication Sig Dispense Refill   . acetaminophen (TYLENOL) 500 MG tablet Take 500 mg by mouth as needed for Pain.     Marland Kitchen anastrozole (ARIMIDEX) 1 MG tablet Take 1 tablet (1 mg total) by mouth daily. 60 tablet 0   . aspirin 81 MG EC tablet Take 81 mg by mouth daily.     Marland Kitchen ibuprofen (ADVIL,MOTRIN) 600 MG tablet Take 1 tablet (600 mg total) by mouth every 6 (six) hours as needed for Pain.for up to 30 doses 30 tablet 0   . levothyroxine (SYNTHROID, LEVOTHROID) 25 MCG tablet Take 25 mcg by mouth every morning.         Marland Kitchen lisinopril (  PRINIVIL,ZESTRIL) 20 MG tablet Take 1 tablet (20 mg total) by mouth daily. 90 tablet 3   . Multiple Vitamins-Minerals (MULTIVITAMIN WITH MINERALS) tablet Take 1 tablet by mouth daily.     Marland Kitchen omeprazole (PRILOSEC) 10 MG capsule Take 2 capsules (20 mg total) by mouth daily. 90 capsule 3   . simvastatin (ZOCOR) 40 MG tablet Take 40 mg by mouth every morning.         . vitamin B-1 (THIAMINE) 100 MG tablet Take 100 mg by mouth every morning.       No current facility-administered medications for this visit.      Past Medical  History:   Diagnosis Date   . Breast cancer 2014    right breast mastectomy   . Hyperlipidemia    . Hypertension    . Malignant neoplasm of overlapping sites of right female breast 10/19/2015     Past Surgical History:   Procedure Laterality Date   . HYSTERECTOMY     . MASTECTOMY Right 2015     Family History   Problem Relation Age of Onset   . Breast cancer Neg Hx      Social History     Social History   . Marital status: Widowed     Spouse name: N/A   . Number of children: N/A   . Years of education: N/A     Occupational History   . Not on file.     Social History Main Topics   . Smoking status: Never Smoker   . Smokeless tobacco: Never Used   . Alcohol use Yes      Comment: socially   . Drug use: Unknown   . Sexual activity: Not on file     Other Topics Concern   . Not on file     Social History Narrative   . No narrative on file     Review of Systems  All systems were reviewed and are negative except for that which is mentioned in the history of present illness.    Objective:     There were no vitals filed for this visit.     Physical Exam   Constitutional:  Well developed, well nourished, no acute distress, non-toxic appearance   Eyes:  PERRL, conjunctiva normal   HENT:  Atraumatic, external ears normal, nose normal, oropharynx moist, no pharyngeal exudates. Neck- normal range of motion, no tenderness, supple   Respiratory:  No respiratory distress, normal breath sounds, no rales, no wheezing   Cardiovascular:  Normal rate, normal rhythm, no murmurs, no gallops, no rubs   Breast:  Right breast surgically absent with well-healed mastectomy scar. Flaps have some redundancy medially and laterally.  Right axilla and supraclavicular fossa are unremarkable. She has fairly good range of motion, but some skin tethering at extension of the arm at the mastectomy site. Left breast is intact. There are no obvious skin or nipple lesions and no discrete dominant mass. Left axillary region with 0.5cm known sebaceous cyst as  per [place]  GI:  Soft, nondistended, normal bowel sounds, nontender, no organomegaly, no mass, no rebound, no guarding   GU:  No costovertebral angle tenderness   Musculoskeletal:  No edema, no tenderness, no deformities. Back- no tenderness  Integument:  Well hydrated, no rash   Lymphatic:  No lymphadenopathy noted   Neurologic:  Alert & oriented x 3, CN 2-12 normal, normal motor function, normal sensory function, no focal deficits noted   Psychiatric:  Speech and behavior  appropriate     RADIOLOGY DATA:   Mammography screening bilateral 08/14/2013  FINDINGS:  Bilateral CC and MLO projection digital mammograms were obtained. Tomosynthesis was not performed.     Suspicious and potentially malignant areas of microcalcification have developed in 2 separate regions within the right breast since prior study performed on 12/16/2011. Consideration for biopsy of these 2  regions is suggested. One of the areas of developing microcalcifications is located in the upper lateral portion of the right breast. The other area of developing microcalcification is in the inferior medial portion  of the right breast. The left breast appears unremarkable.       IMPRESSION:     2 areas of microcalcifications have developed in the right breast. One of these regions is in the upper outer portion of the right breast. The other region is in the inferior medial portion of the right  breast. These areas would be amenable to stereotactic biopsy and biopsy is suggested.     BI-RADS Category 4, biopsy suggested of 2 separate regions of microcalcifications within the right breast.    PATHOLOGY DATA:   09/11/2013 Surgical Pathology Report,  Right Breast Tissue       A.  RIGHT BREAST CALCIFICATIONS, NEEDLE BIOPSIES:          -  DUCTAL CARCINOMA IN SITU, HIGH GRADE, COMEDO TYPE.          -  MICROCALCIFICATIONS PRESENT WITHIN THE TUMOR.        B  RIGHT BREAST CALCIFICATIONS, NEEDLE BIOPSIES:         -  DUCTAL CARCINOMA IN SITU, HIGH GRADE, COMEDO TYPE,  WITH    MULTIPLE FOCI                 OF MICROINVASION.         -  MICROCALCIFICATIONS PRESENT WITHIN THE TUMOR.     ER    Percent Positive: 99.60%  Positive        Stain Intensity: 3    PR    Percent Positive: 33.48%  Positive        Stain Intensity: 1    Her2/neu   Stain Intensity: 2+   Equivocal    Ki-67    Percent Positive: 17%   Borderline       Assessment & Plan:   71 year old female with history of right-sided breast cancer, T1N0M0, status post right total mastectomy and SLNB, currently on endocrine therapy, presenting for clinic follow up visit.    The patient is currently on a 5 year course of endocrine therapy, started early 2015, to be completed in 2020, with anastrozole 1 mg by mouth daily, which she is tolerating well.  Refill today.    DEXA scan performed in July 2018 revealed osteopenia.  Given the potential side effect of decreased bone density with aromatase inhibitors, the patient would be indicated to start a bisphosphonate.  We previously discussed Reclast (zoledronic acid) once yearly.      She previously underwent a dental evaluation, given the risk of osteonecrosis of the jaw with bisphosphonates, with recommendation for an elective root canal. The patient chose not to undergo any dental procedures, and has not decided otherwise as of today.  Encouraged to continue vitamin D and calcium supplementation.    The patient remains troubled with the discomfort at the right mastectomy site. She has previously expressed that she might wish to have revision and reconsideration of additional reconstruction.  As the patient would like to  establish care in this region, given the difficulty of traveling to [place], she has been referred to plastic surgery for further evaluation Jim Like, MD in Plastic Surgery at [facility]).  She still wishes to consider this.    Will refer again to physical therapy for further evaluation and management, if indicated, given her limited range of motion.    Left axillary  palpable region: It was unclear to the patient whether there was some type of surgical procedure performed in the area versus lymphadenopathy.  The patient has no recollection of that timeframe.  [place] Left axilla on March 14, 2016 revealed a sebaceous cyst in the left axilla.  No left axillary lymphadenopathy.  She may consider discussing this with surgery for removal.     Surveillance:     A bilateral ultrasound breast on August 17, 2016 revealed no sonographic evidence of malignancy bilaterally  Left diagnostic mammogram April 11, 2017: No mammographic evidence of malignancy.        To return to clinic in 6 months.

## 2017-06-23 ENCOUNTER — Encounter (INDEPENDENT_AMBULATORY_CARE_PROVIDER_SITE_OTHER): Payer: Self-pay | Admitting: Hematology & Oncology

## 2017-06-23 ENCOUNTER — Ambulatory Visit (INDEPENDENT_AMBULATORY_CARE_PROVIDER_SITE_OTHER): Payer: Medicare Other | Admitting: Hematology & Oncology

## 2017-06-23 DIAGNOSIS — C50811 Malignant neoplasm of overlapping sites of right female breast: Secondary | ICD-10-CM

## 2017-06-23 DIAGNOSIS — Z170 Estrogen receptor positive status [ER+]: Secondary | ICD-10-CM

## 2017-06-23 DIAGNOSIS — L723 Sebaceous cyst: Secondary | ICD-10-CM

## 2017-06-23 DIAGNOSIS — M8580 Other specified disorders of bone density and structure, unspecified site: Secondary | ICD-10-CM

## 2017-06-23 DIAGNOSIS — Z9011 Acquired absence of right breast and nipple: Secondary | ICD-10-CM

## 2017-06-23 MED ORDER — ANASTROZOLE 1 MG PO TABS: 1.0000 mg | ORAL_TABLET | Freq: Every day | ORAL | 3 refills | Status: DC

## 2017-07-10 ENCOUNTER — Emergency Department
Admission: EM | Admit: 2017-07-10 | Discharge: 2017-07-10 | Disposition: A | Discharge status: Home / Self Care | Payer: Medicare Other | Attending: Emergency Medicine | Admitting: Emergency Medicine

## 2017-07-10 DIAGNOSIS — Z79899 Other long term (current) drug therapy: Secondary | ICD-10-CM | POA: Insufficient documentation

## 2017-07-10 DIAGNOSIS — Z9011 Acquired absence of right breast and nipple: Secondary | ICD-10-CM | POA: Insufficient documentation

## 2017-07-10 DIAGNOSIS — E785 Hyperlipidemia, unspecified: Secondary | ICD-10-CM | POA: Insufficient documentation

## 2017-07-10 DIAGNOSIS — R0789 Other chest pain: Secondary | ICD-10-CM

## 2017-07-10 DIAGNOSIS — Z853 Personal history of malignant neoplasm of breast: Secondary | ICD-10-CM | POA: Insufficient documentation

## 2017-07-10 DIAGNOSIS — G8929 Other chronic pain: Secondary | ICD-10-CM | POA: Insufficient documentation

## 2017-07-10 DIAGNOSIS — I10 Essential (primary) hypertension: Secondary | ICD-10-CM | POA: Insufficient documentation

## 2017-07-10 LAB — COMPREHENSIVE METABOLIC PANEL
ALT: 16 U/L (ref 0–55)
AST (SGOT): 20 U/L (ref 5–34)
Albumin/Globulin Ratio: 1.5 (ref 0.9–2.2)
Albumin: 4 g/dL (ref 3.5–5.0)
Alkaline Phosphatase: 89 U/L (ref 37–106)
Anion Gap: 12 (ref 5.0–15.0)
BUN: 8 mg/dL (ref 7–19)
Bilirubin, Total: 0.3 mg/dL (ref 0.2–1.2)
CO2: 25 mEq/L (ref 22–29)
Calcium: 9.4 mg/dL (ref 7.9–10.2)
Chloride: 106 mEq/L (ref 100–111)
Creatinine: 0.8 mg/dL (ref 0.6–1.0)
Globulin: 2.7 g/dL (ref 2.0–3.6)
Glucose: 98 mg/dL (ref 70–100)
Potassium: 4.2 mEq/L (ref 3.5–5.1)
Protein, Total: 6.7 g/dL (ref 6.0–8.3)
Sodium: 143 mEq/L (ref 136–145)

## 2017-07-10 LAB — CBC AND DIFFERENTIAL
Absolute NRBC: 0 10*3/uL
Basophils Absolute Automated: 0.04 10*3/uL (ref 0.00–0.20)
Basophils Automated: 0.5 %
Eosinophils Absolute Automated: 0.15 10*3/uL (ref 0.00–0.70)
Eosinophils Automated: 2 %
Hematocrit: 39.9 % (ref 37.0–47.0)
Hgb: 11.9 g/dL — ABNORMAL LOW (ref 12.0–16.0)
Immature Granulocytes Absolute: 0.03 10*3/uL
Immature Granulocytes: 0.4 %
Lymphocytes Absolute Automated: 2.99 10*3/uL (ref 0.50–4.40)
Lymphocytes Automated: 40.1 %
MCH: 26.4 pg — ABNORMAL LOW (ref 28.0–32.0)
MCHC: 29.8 g/dL — ABNORMAL LOW (ref 32.0–36.0)
MCV: 88.7 fL (ref 80.0–100.0)
MPV: 11.6 fL (ref 9.4–12.3)
Monocytes Absolute Automated: 0.54 10*3/uL (ref 0.00–1.20)
Monocytes: 7.2 %
Neutrophils Absolute: 3.71 10*3/uL (ref 1.80–8.10)
Neutrophils: 49.8 %
Nucleated RBC: 0 /100 WBC (ref 0.0–1.0)
Platelets: 327 10*3/uL (ref 140–400)
RBC: 4.5 10*6/uL (ref 4.20–5.40)
RDW: 13 % (ref 12–15)
WBC: 7.46 10*3/uL (ref 3.50–10.80)

## 2017-07-10 LAB — PT AND APTT
PT INR: 0.9 (ref 0.9–1.1)
PT: 12 s — ABNORMAL LOW (ref 12.6–15.0)
PTT: 31 s (ref 23–37)

## 2017-07-10 LAB — GLUCOSE WHOLE BLOOD - POCT: Whole Blood Glucose POCT: 108 mg/dL — ABNORMAL HIGH (ref 70–100)

## 2017-07-10 LAB — GFR: EGFR: >60

## 2017-07-10 LAB — TROPONIN I: Troponin I: <0.01 ng/mL (ref 0.00–0.09)

## 2017-07-10 MED ORDER — TRAMADOL HCL 50 MG PO TABS
50.0000 mg | ORAL_TABLET | Freq: Four times a day (QID) | ORAL | 0 refills | Status: DC | PRN
Start: 2017-07-10 — End: 2018-04-04

## 2017-07-10 MED ORDER — KETOROLAC TROMETHAMINE 30 MG/ML IJ SOLN
15.0000 mg | Freq: Once | INTRAMUSCULAR | Status: AC
Start: 2017-07-10 — End: 2017-07-10
  Administered 2017-07-10: 15 mg via INTRAVENOUS
  Filled 2017-07-10: qty 1

## 2017-07-10 NOTE — ED Notes (Signed)
IV attempt unsuccessful

## 2017-07-10 NOTE — EDIE (Signed)
Montgomery Rothlisberger?NOTIFICATION?07/10/2017 05:22?Camala, Talwar B?MRN: [id number]    This patient has registered at the [facility] Emergency Department   For more information visit: [email or web address]   [facility]'s patient encounter information:   VWU:?[id number]  Account [id number]  Billing Account [id number]      Criteria met      5 ED Visits in 12 Months    Security Events  No recent Security Events currently on file    ED Care Guidelines  There are currently no ED Care Guidelines in [place] for this patient. Please check your facility's medical records system.    Care Providers  Starsky Nanna has no care providers on record at this time.   E.D. Visit Count (12 mo.)  Facility Visits   [facility] 5   Total 5   Note: Visits indicate total known visits.      Recent Emergency Department Visit Summary  Admit Date Facility [facility] Type Major Type Diagnoses or Chief Complaint   Jul 10, 2017 [facility] [place] [facility]  Emergency      Dizziness      May 26, 2017 [facility] [place] [facility]  Emergency      triage- left sided numbness, extremity weakness      Tingling          Recent Inpatient Visit Summary  No recorded inpatient visits.       Prescription Monitoring Program  000??- Narcotic Use Score  000??- Sedative Use Score  000??- Stimulant Use Score  - All Scores range from 000-999 with 75% of the population scoring < 200 and on 1% scoring above 650  - The last digit of the narcotic, sedative, and stimulant score indicates the number of active prescriptions of that type  - Higher Use scores correlate with increased prescribers, pharmacies, mg equiv, and overlapping prescriptions   Concerning or unexpectedly high scores should prompt a review of the PMP record; this does not constitute checking PMP for prescribing purposes.    The above information is provided for the sole purpose of patient  treatment. Use of this information beyond the terms of Data Sharing Memorandum of Understanding and License Agreement is prohibited. In certain cases not all visits may be represented. Consult the aforementioned facilities for additional information.   ? 2018 [employer] - [place], [place] - [email or web address]

## 2017-07-10 NOTE — ED Provider Notes (Signed)
EMERGENCY DEPARTMENT NOTE    Physician/Midlevel provider first contact with patient: 07/10/17 3875         HISTORY OF PRESENT ILLNESS   Historian: patient  Translator Used: no    71 y.o. female presents with chest wall pain.    1. Location of symptoms: right anterior chest wall pain, along her mastectomy site  2. Onset of symptoms: Worse in the past 24 hours. Patient is not very clear regarding when pain started, but it appears to be chronic. Her oncology note on 06/23/17 discusses her pain.  3. What was patient doing when symptoms started (Context): s/p right mastectomy 2 yrs ago  4. Severity: severe  5. Timing: constant  6. Activities that worsen symptoms: movement  7. Activities that improve symptoms: aleve  8. Quality: sharp pain   9. Radiation of symptoms: none  10. Associated signs and Symptoms: none   11. Are symptoms worsening? yes          MEDICAL HISTORY     Past Medical History:  Past Medical History:   Diagnosis Date   . Breast cancer 2014    right breast mastectomy   . Hyperlipidemia    . Hypertension    . Malignant neoplasm of overlapping sites of right female breast 10/19/2015       Past Surgical History:  Past Surgical History:   Procedure Laterality Date   . HYSTERECTOMY     . MASTECTOMY Right 2015       Social History:  Social History     Social History   . Marital status: Widowed     Spouse name: N/A   . Number of children: N/A   . Years of education: N/A     Occupational History   . Not on file.     Social History Main Topics   . Smoking status: Never Smoker   . Smokeless tobacco: Never Used   . Alcohol use Yes      Comment: socially   . Drug use: No   . Sexual activity: Not on file     Other Topics Concern   . Not on file     Social History Narrative   . No narrative on file       Family History:  Family History   Problem Relation Age of Onset   . Breast cancer Neg Hx        Outpatient Medication:  Previous Medications    ACETAMINOPHEN (TYLENOL) 500 MG TABLET    Take 500 mg by mouth as needed  for Pain.    ANASTROZOLE (ARIMIDEX) 1 MG TABLET    Take 1 tablet (1 mg total) by mouth daily.    ASPIRIN 81 MG EC TABLET    Take 81 mg by mouth daily.    IBUPROFEN (ADVIL,MOTRIN) 600 MG TABLET    Take 1 tablet (600 mg total) by mouth every 6 (six) hours as needed for Pain.for up to 30 doses    LEVOTHYROXINE (SYNTHROID, LEVOTHROID) 25 MCG TABLET    Take 25 mcg by mouth every morning.        LISINOPRIL (PRINIVIL,ZESTRIL) 20 MG TABLET    Take 1 tablet (20 mg total) by mouth daily.    MULTIPLE VITAMINS-MINERALS (MULTIVITAMIN WITH MINERALS) TABLET    Take 1 tablet by mouth daily.    OMEPRAZOLE (PRILOSEC) 10 MG CAPSULE    Take 2 capsules (20 mg total) by mouth daily.    SIMVASTATIN (ZOCOR) 40 MG TABLET  Take 40 mg by mouth every morning.        VITAMIN B-1 (THIAMINE) 100 MG TABLET    Take 100 mg by mouth every morning.       Allergies:  Allergies   Allergen Reactions   . Morphine Itching       REVIEW OF SYSTEMS   Review of Systems   Constitutional: Positive for malaise/fatigue.   Respiratory: Negative for shortness of breath.    Cardiovascular: Positive for chest pain. Negative for leg swelling.   Neurological: Positive for weakness.   All other systems reviewed and are negative.        PHYSICAL EXAM     Vitals:    07/10/17 0632   BP: 182/74   Pulse: (!) 47   Resp: 19   Temp:    SpO2: 100%       Nursing note and vitals reviewed.    Constitutional: non-toxic  Head: Atraumatic.  Eyes: PERRL. EOMI. No scleral icterus.  ENT: Mucous membranes are moist and intact. Oropharynx is clear. Patent airway.  Neck: Supple. No cervical lymphadenopathy.  Cardiovascular: Regular rate. Regular rhythm. No murmurs, rubs, or gallops.  Pulmonary/Chest: No evidence of respiratory distress. Clear to auscultation bilaterally. No wheezing, rales or rhonchi.   Chest wall- right mastectomy site noted for large scar, tenderness without any appreciable mass around scar, pain referred to right chest wall with movement of right arm and trunk of body,  left breast without tenderness or mass  GI: Soft, non-distended abdomen. No tenderness to palpation of abdomen.  Extremities: No edema. No deformity.  Skin: No rash.   Neurological: Awake, alert and oriented x 3. CN II-XII intact. Moving all ext  Psychiatric: Appropriate affect. Appropriate mood. Appropriate behavior.    MEDICAL DECISION MAKING   Chest wall pain. This is chronic s/p right mastectomy. Her oncologist had referred her to a plastic surgeon Dr. Lewie Chamber for revision of her scar. She hasn't followed up. Giving a dose of toradol here for pain. I will refer patient to Dr. Lewie Chamber.     I do not suspect ACS or PE or other acute emergent condition. An EKG was obtained prior to my evaluation of patient and is read below. Labs were ordered before my evaluation of patient and were reviewed.    DISCUSSION      Vital Signs: Reviewed the patient?s vital signs.   Nursing Notes: Reviewed and utilized available nursing notes.  Medical Records Reviewed: Reviewed available past medical records.  Counseling: The emergency provider has spoken with the patient and discussed today?s findings, in addition to providing specific details for the plan of care.  Questions are answered and there is agreement with the plan.    IMAGING STUDIES    The following imaging studies were independently interpreted by the Emergency Medicine Physician.  For full imaging study results please see chart.    CARDIAC STUDIES     The following cardiac studies were independently interpreted by the Emergency Medicine Physician. For full cardiac study results please see chart     EKG Interpretation:   Signed and interpreted by ED Physician   Time Interpreted: 0543  Comparison:   Rate: 57  Rhythm: sinus bradycardia   Axis: normal   Intervals: normal  Blocks: none  ST segments: No acute changes.   Interpretation: Normal EKG    PULSE OXIMETRY    Oxygen Saturation by Pulse Oximetry: 100% RA  Interventions: none  Interpretation: normal    EMERGENCY DEPT.  MEDICATIONS  ED Medication Orders     Start Ordered     Status Ordering Provider    07/10/17 [id number] 07/10/17 0652  ketorolac (TORADOL) injection 15 mg  Once     Route: Intravenous  Ordered Dose: 15 mg     Last MAR action:  Given Demetrus Pavao WINDSOR          LABORATORY RESULTS    Ordered and independently interpreted AVAILABLE laboratory tests. Please see results section in chart for full details.  Results for orders placed or performed during the hospital encounter of 07/10/17   Comprehensive Metabolic Panel (CMP)   Result Value Ref Range    Glucose 98 70 - 100 mg/dL    BUN 8 7 - 19 mg/dL    Creatinine 0.8 0.6 - 1.0 mg/dL    Sodium 960 454 - 098 mEq/L    Potassium 4.2 3.5 - 5.1 mEq/L    Chloride 106 100 - 111 mEq/L    CO2 25 22 - 29 mEq/L    Calcium 9.4 7.9 - 10.2 mg/dL    Protein, Total 6.7 6.0 - 8.3 g/dL    Albumin 4.0 3.5 - 5.0 g/dL    AST (SGOT) 20 5 - 34 U/L    ALT 16 0 - 55 U/L    Alkaline Phosphatase 89 37 - 106 U/L    Bilirubin, Total 0.3 0.2 - 1.2 mg/dL    Globulin 2.7 2.0 - 3.6 g/dL    Albumin/Globulin Ratio 1.5 0.9 - 2.2    Anion Gap 12.0 5.0 - 15.0   CBC with differential   Result Value Ref Range    WBC 7.46 3.50 - 10.80 x10 3/uL    Hgb 11.9 (L) 12.0 - 16.0 g/dL    Hematocrit 11.9 14.7 - 47.0 %    Platelets 327 140 - 400 x10 3/uL    RBC 4.50 4.20 - 5.40 x10 6/uL    MCV 88.7 80.0 - 100.0 fL    MCH 26.4 (L) 28.0 - 32.0 pg    MCHC 29.8 (L) 32.0 - 36.0 g/dL    RDW 13 12 - 15 %    MPV 11.6 9.4 - 12.3 fL    Neutrophils 49.8 None %    Lymphocytes Automated 40.1 None %    Monocytes 7.2 None %    Eosinophils Automated 2.0 None %    Basophils Automated 0.5 None %    Immature Granulocyte 0.4 None %    Nucleated RBC 0.0 0.0 - 1.0 /100 WBC    Neutrophils Absolute 3.71 1.80 - 8.10 x10 3/uL    Abs Lymph Automated 2.99 0.50 - 4.40 x10 3/uL    Abs Mono Automated 0.54 0.00 - 1.20 x10 3/uL    Abs Eos Automated 0.15 0.00 - 0.70 x10 3/uL    Absolute Baso Automated 0.04 0.00 - 0.20 x10 3/uL    Absolute Immature  Granulocyte 0.03 0 x10 3/uL    Absolute NRBC 0.00 0 x10 3/uL   PT/APTT   Result Value Ref Range    PT 12.0 (L) 12.6 - 15.0 sec    PT INR 0.9 0.9 - 1.1    PT Anticoag. Given Within 48 hrs. None     PTT 31 23 - 37 sec   GFR   Result Value Ref Range    EGFR >60.0    Troponin I   Result Value Ref Range    Troponin I <0.01 0.00 - 0.09 ng/mL   Glucose Whole Blood - POCT  Result Value Ref Range    POCT - Glucose Whole blood 108 (H) 70 - 100 mg/dL   ECG 12 Lead   Result Value Ref Range    Ventricular Rate 57 BPM    Atrial Rate 57 BPM    P-R Interval 180 ms    QRS Duration 90 ms    Q-T Interval 410 ms    QTC Calculation (Bezet) 399 ms    P Axis 10 degrees    R Axis 24 degrees    T Axis 44 degrees       ATTESTATIONS      Physician Attestation: Darlyn Read MD, have been the primary provider for Emily [name] during this Emergency Dept visit and have reviewed the chart for accuracy and agree with its content.     DIAGNOSIS      Diagnosis:  Final diagnoses:   Chronic chest wall pain       Disposition:  ED Disposition     ED Disposition Condition Date/Time Comment    Discharge  Mon Jul 10, 2017  7:25 AM Emily [name] discharge to home/self care.    Condition at disposition: Stable          Prescriptions:  Patient's Medications   New Prescriptions    TRAMADOL (ULTRAM) 50 MG TABLET    Take 1 tablet (50 mg total) by mouth every 6 (six) hours as needed for Pain.Do not drive or operate machinery while taking this medication   Previous Medications    ACETAMINOPHEN (TYLENOL) 500 MG TABLET    Take 500 mg by mouth as needed for Pain.    ANASTROZOLE (ARIMIDEX) 1 MG TABLET    Take 1 tablet (1 mg total) by mouth daily.    ASPIRIN 81 MG EC TABLET    Take 81 mg by mouth daily.    IBUPROFEN (ADVIL,MOTRIN) 600 MG TABLET    Take 1 tablet (600 mg total) by mouth every 6 (six) hours as needed for Pain.for up to 30 doses    LEVOTHYROXINE (SYNTHROID, LEVOTHROID) 25 MCG TABLET    Take 25 mcg by mouth every morning.        LISINOPRIL  (PRINIVIL,ZESTRIL) 20 MG TABLET    Take 1 tablet (20 mg total) by mouth daily.    MULTIPLE VITAMINS-MINERALS (MULTIVITAMIN WITH MINERALS) TABLET    Take 1 tablet by mouth daily.    OMEPRAZOLE (PRILOSEC) 10 MG CAPSULE    Take 2 capsules (20 mg total) by mouth daily.    SIMVASTATIN (ZOCOR) 40 MG TABLET    Take 40 mg by mouth every morning.        VITAMIN B-1 (THIAMINE) 100 MG TABLET    Take 100 mg by mouth every morning.   Modified Medications    No medications on file   Discontinued Medications    No medications on file          Marland Mcalpine, MD  07/10/17 [phone]

## 2017-07-10 NOTE — Discharge Instructions (Signed)
Chest wall pain.  Please follow up with Dr. Lewie Chamber (the plastic surgeon your oncologist referred you to).  Continue to take Aleve for your pain.

## 2017-07-17 LAB — ECG 12-LEAD
Atrial Rate: 57 {beats}/min
P Axis: 10 degrees
P-R Interval: 180 ms
Q-T Interval: 410 ms
QRS Duration: 90 ms
QTC Calculation (Bezet): 399 ms
R Axis: 24 degrees
T Axis: 44 degrees
Ventricular Rate: 57 {beats}/min

## 2017-09-13 ENCOUNTER — Emergency Department: Payer: Medicare Other

## 2017-09-13 ENCOUNTER — Emergency Department
Admission: EM | Admit: 2017-09-13 | Discharge: 2017-09-13 | Disposition: A | Discharge status: Home / Self Care | Payer: Medicare Other | Attending: Emergency Medicine | Admitting: Emergency Medicine

## 2017-09-13 DIAGNOSIS — M791 Myalgia, unspecified site: Secondary | ICD-10-CM

## 2017-09-13 DIAGNOSIS — Z853 Personal history of malignant neoplasm of breast: Secondary | ICD-10-CM | POA: Insufficient documentation

## 2017-09-13 DIAGNOSIS — E785 Hyperlipidemia, unspecified: Secondary | ICD-10-CM | POA: Insufficient documentation

## 2017-09-13 DIAGNOSIS — Z7982 Long term (current) use of aspirin: Secondary | ICD-10-CM | POA: Insufficient documentation

## 2017-09-13 DIAGNOSIS — R079 Chest pain, unspecified: Secondary | ICD-10-CM | POA: Insufficient documentation

## 2017-09-13 DIAGNOSIS — Z79899 Other long term (current) drug therapy: Secondary | ICD-10-CM | POA: Insufficient documentation

## 2017-09-13 DIAGNOSIS — I10 Essential (primary) hypertension: Secondary | ICD-10-CM | POA: Insufficient documentation

## 2017-09-13 DIAGNOSIS — R202 Paresthesia of skin: Secondary | ICD-10-CM | POA: Insufficient documentation

## 2017-09-13 LAB — URINALYSIS, REFLEX TO MICROSCOPIC EXAM IF INDICATED
Bilirubin, UA: NEGATIVE
Blood, UA: NEGATIVE
Glucose, UA: NEGATIVE
Ketones UA: NEGATIVE
Leukocyte Esterase, UA: NEGATIVE
Nitrite, UA: NEGATIVE
Protein, UR: NEGATIVE
Specific Gravity UA: 1.009 (ref 1.001–1.035)
Urine pH: 7 (ref 5.0–8.0)
Urobilinogen, UA: NEGATIVE mg/dL

## 2017-09-13 LAB — CBC AND DIFFERENTIAL
Absolute NRBC: 0 10*3/uL
Basophils Absolute Automated: 0.05 10*3/uL (ref 0.00–0.20)
Basophils Automated: 0.5 %
Eosinophils Absolute Automated: 0.06 10*3/uL (ref 0.00–0.70)
Eosinophils Automated: 0.6 %
Hematocrit: 42.9 % (ref 37.0–47.0)
Hgb: 12.9 g/dL (ref 12.0–16.0)
Immature Granulocytes Absolute: 0.04 10*3/uL
Immature Granulocytes: 0.4 %
Lymphocytes Absolute Automated: 2.91 10*3/uL (ref 0.50–4.40)
Lymphocytes Automated: 29.6 %
MCH: 26.4 pg — ABNORMAL LOW (ref 28.0–32.0)
MCHC: 30.1 g/dL — ABNORMAL LOW (ref 32.0–36.0)
MCV: 87.9 fL (ref 80.0–100.0)
MPV: 11 fL (ref 9.4–12.3)
Monocytes Absolute Automated: 0.6 10*3/uL (ref 0.00–1.20)
Monocytes: 6.1 %
Neutrophils Absolute: 6.16 10*3/uL (ref 1.80–8.10)
Neutrophils: 62.8 %
Nucleated RBC: 0 /100 WBC (ref 0.0–1.0)
Platelets: 396 10*3/uL (ref 140–400)
RBC: 4.88 10*6/uL (ref 4.20–5.40)
RDW: 13 % (ref 12–15)
WBC: 9.82 10*3/uL (ref 3.50–10.80)

## 2017-09-13 LAB — COMPREHENSIVE METABOLIC PANEL
ALT: 20 U/L (ref 0–55)
AST (SGOT): 23 U/L (ref 5–34)
Albumin/Globulin Ratio: 1.1 (ref 0.9–2.2)
Albumin: 4.1 g/dL (ref 3.5–5.0)
Alkaline Phosphatase: 117 U/L — ABNORMAL HIGH (ref 37–106)
Anion Gap: 15 (ref 5.0–15.0)
BUN: 13 mg/dL (ref 7–19)
Bilirubin, Total: 0.5 mg/dL (ref 0.2–1.2)
CO2: 26 mEq/L (ref 22–29)
Calcium: 9.7 mg/dL (ref 7.9–10.2)
Chloride: 103 mEq/L (ref 100–111)
Creatinine: 0.9 mg/dL (ref 0.6–1.0)
Globulin: 3.8 g/dL — ABNORMAL HIGH (ref 2.0–3.6)
Glucose: 89 mg/dL (ref 70–100)
Potassium: 4.2 mEq/L (ref 3.5–5.1)
Protein, Total: 7.9 g/dL (ref 6.0–8.3)
Sodium: 144 mEq/L (ref 136–145)

## 2017-09-13 LAB — LIPASE
Lipase: 23 U/L (ref 8–78)
Lipase: 25 U/L (ref 8–78)

## 2017-09-13 LAB — GFR: EGFR: >60

## 2017-09-13 LAB — IHS D-DIMER: D-Dimer: 0.36 ug/mL FEU (ref 0.00–0.70)

## 2017-09-13 LAB — TROPONIN I: Troponin I: <0.01 ng/mL (ref 0.00–0.09)

## 2017-09-13 LAB — GROUP A STREP, RAPID ANTIGEN: Group A Strep, Rapid Antigen: NEGATIVE

## 2017-09-13 MED ORDER — ONDANSETRON HCL 4 MG/2ML IJ SOLN
4.0000 mg | Freq: Once | INTRAMUSCULAR | Status: AC
Start: 2017-09-13 — End: 2017-09-13
  Administered 2017-09-13: 4 mg via INTRAVENOUS
  Filled 2017-09-13: qty 2

## 2017-09-13 MED ORDER — SODIUM CHLORIDE 0.9 % IV BOLUS
1000.0000 mL | Freq: Once | INTRAVENOUS | Status: AC
Start: 2017-09-13 — End: 2017-09-13
  Administered 2017-09-13: 1000 mL via INTRAVENOUS

## 2017-09-13 MED ORDER — LIDOCAINE VISCOUS 2 % MT SOLN
10.0000 mL | Freq: Once | OROMUCOSAL | Status: AC
Start: 2017-09-13 — End: 2017-09-13
  Administered 2017-09-13: 10 mL via OROMUCOSAL
  Filled 2017-09-13: qty 15

## 2017-09-13 MED ORDER — ALUM & MAG HYDROXIDE-SIMETH 200-200-20 MG/5ML PO SUSP
30.0000 mL | Freq: Once | ORAL | Status: AC
Start: 2017-09-13 — End: 2017-09-13
  Administered 2017-09-13: 30 mL via ORAL
  Filled 2017-09-13: qty 30

## 2017-09-13 MED ORDER — ACETAMINOPHEN 500 MG PO TABS
1000.0000 mg | ORAL_TABLET | Freq: Once | ORAL | Status: AC
Start: 2017-09-13 — End: 2017-09-13
  Administered 2017-09-13: 1000 mg via ORAL
  Filled 2017-09-13: qty 2

## 2017-09-13 NOTE — ED Provider Notes (Signed)
Physician/Midlevel provider first contact with patient: 09/13/17 1208         History     Chief Complaint   Patient presents with   . Generalized Body Aches     Generalized body aches 'Everywhere'. Quick onset yesterday afternoon. Dizzy. Nauseated. Took anti HTN this AM. CO exposure 'in my apt two nights ago'.   . Nausea   . Dizziness     Emily [name] presents with chest pain and left sided tingling that developed yesterday, and when she awoke this morning, she reports that her symptoms have progressed to include generalized mylagias and paresthesias, dizziness, back pain, nausea, but no vomiting.  She became particular concerned because her blood pressure was very elevated. She repors a dry cough, no fever.  The chest pain has been constant since last night.  It is worse with palpation.  Her CO monitor went off a couple of days ago.  The fire department has since been there to inspect, and she has replaced her CO monitors.                  Past Medical History:   Diagnosis Date   . Breast cancer 2014    right breast mastectomy   . Hyperlipidemia    . Hypertension    . Malignant neoplasm of overlapping sites of right female breast 10/19/2015       Past Surgical History:   Procedure Laterality Date   . HYSTERECTOMY     . MASTECTOMY Right 2015       Family History   Problem Relation Age of Onset   . Breast cancer Neg Hx        Social  Social History   Substance Use Topics   . Smoking status: Never Smoker   . Smokeless tobacco: Never Used   . Alcohol use Yes      Comment: socially       .     Allergies   Allergen Reactions   . Morphine Itching       Home Medications     Med List Status:  In Progress Set By: Festus Aloe, RN at 09/13/2017 12:58 PM                acetaminophen (TYLENOL) 500 MG tablet     Take 500 mg by mouth as needed for Pain.     anastrozole (ARIMIDEX) 1 MG tablet     Take 1 tablet (1 mg total) by mouth daily.     aspirin 81 MG EC tablet     Take 81 mg by mouth daily.     ibuprofen (ADVIL,MOTRIN) 600  MG tablet     Take 1 tablet (600 mg total) by mouth every 6 (six) hours as needed for Pain.for up to 30 doses     levothyroxine (SYNTHROID, LEVOTHROID) 25 MCG tablet     Take 25 mcg by mouth every morning.         lisinopril (PRINIVIL,ZESTRIL) 20 MG tablet     Take 1 tablet (20 mg total) by mouth daily.     Multiple Vitamins-Minerals (MULTIVITAMIN WITH MINERALS) tablet     Take 1 tablet by mouth daily.     omeprazole (PRILOSEC) 10 MG capsule     Take 2 capsules (20 mg total) by mouth daily.     simvastatin (ZOCOR) 40 MG tablet     Take 40 mg by mouth every morning.         traMADol Janean Sark)  50 MG tablet     Take 1 tablet (50 mg total) by mouth every 6 (six) hours as needed for Pain.Do not drive or operate machinery while taking this medication     vitamin B-1 (THIAMINE) 100 MG tablet     Take 100 mg by mouth every morning.           Review of Systems   Constitutional: Negative for chills and fever.   HENT: Negative for trouble swallowing.    Respiratory: Positive for cough. Negative for shortness of breath.    Cardiovascular: Positive for chest pain. Negative for leg swelling.   Gastrointestinal: Positive for abdominal pain and nausea.   Genitourinary: Negative for dysuria.   Musculoskeletal: Positive for myalgias. Negative for arthralgias, back pain and neck pain.   Neurological: Positive for dizziness and numbness. Negative for headaches.   All other systems reviewed and are negative.      Physical Exam    BP: 176/82, Heart Rate: (!) 58, Temp: 99.3 F (37.4 C), Resp Rate: 16, SpO2: 99 %, Weight: 75.1 kg    Physical Exam   Constitutional: She is oriented to person, place, and time. She appears well-developed and well-nourished.   HENT:   Head: Normocephalic and atraumatic.   Eyes: No scleral icterus.   Neck: No JVD present.   Cardiovascular: Normal rate and regular rhythm.    No murmur heard.  Pulmonary/Chest: Effort normal and breath sounds normal. No respiratory distress. She has no wheezes. She has no rales.  She exhibits tenderness (over her sternum).   R mastectomy     Abdominal: Soft. Bowel sounds are normal. She exhibits no distension. There is tenderness in the epigastric area.   Musculoskeletal: She exhibits no edema.   Neurological: She is alert and oriented to person, place, and time. No cranial nerve deficit or sensory deficit.   Skin: Skin is warm and dry.   Psychiatric: She has a normal mood and affect.   Nursing note and vitals reviewed.        MDM and ED Course     ED Medication Orders     Start Ordered     Status Ordering Provider    09/13/17 1439 09/13/17 1438  lidocaine viscous (XYLOCAINE) 2 % solution 10 mL  Once     Route: Mouth/Throat  Ordered Dose: 10 mL     Last MAR action:  Given Cassey Bacigalupo O    09/13/17 1439 09/13/17 1438  alum & mag hydroxide-simethicone (MAALOX PLUS) 200-200-20 mg/5 mL suspension 30 mL  Once     Route: Oral  Ordered Dose: 30 mL     Last MAR action:  Given Jenisha Faison O    09/13/17 1226 09/13/17 1225  ondansetron (ZOFRAN) injection 4 mg  Once     Route: Intravenous  Ordered Dose: 4 mg     Last MAR action:  Given Sadat Sliwa O    09/13/17 1226 09/13/17 1225  acetaminophen (TYLENOL) tablet 1,000 mg  Once     Route: Oral  Ordered Dose: 1,000 mg     Last MAR action:  Given Jleigh Striplin O    09/13/17 1226 09/13/17 1225  sodium chloride 0.9 % bolus 1,000 mL  Once     Route: Intravenous  Ordered Dose: 1,000 mL     Last MAR action:  New Bag Milanni Ayub O         EKG: NSr @ 56 bpm, no ST/T changes, normal ekg.Marland Kitchen No change from 07/10/17  Results     Procedure Component Value Units Date/Time    Lipase [[id number]] Collected:  09/13/17 1252    Specimen:  Blood Updated:  09/13/17 1501     Lipase 25 U/L     UA with reflex to micro (all hospital ED's and [facility], pts 3 + yrs) [[id number]] Collected:  09/13/17 1344    Specimen:  Urine Updated:  09/13/17 1408     Urine Type Clean Catch     Color, UA Straw     Clarity, UA Clear     Specific  Gravity UA 1.009     Urine pH 7.0     Leukocyte Esterase, UA Negative     Nitrite, UA Negative     Protein, UR Negative     Glucose, UA Negative     Ketones UA Negative     Urobilinogen, UA Negative mg/dL      Bilirubin, UA Negative     Blood, UA Negative     RBC, UA 0 - 2 /hpf      WBC, UA 0 - 5 /hpf      Squamous Epithelial Cells, Urine 0 - 5 /hpf     Troponin I [[id number]] Collected:  09/13/17 1252    Specimen:  Blood Updated:  09/13/17 1336     Troponin I <0.01 ng/mL     Comprehensive Metabolic Panel (CMP) [[id number]]  (Abnormal) Collected:  09/13/17 1252    Specimen:  Blood Updated:  09/13/17 1331     Glucose 89 mg/dL      BUN 13 mg/dL      Creatinine 0.9 mg/dL      Sodium 629 mEq/L      Potassium 4.2 mEq/L      Chloride 103 mEq/L      CO2 26 mEq/L      Calcium 9.7 mg/dL      Protein, Total 7.9 g/dL      Albumin 4.1 g/dL      AST (SGOT) 23 U/L      ALT 20 U/L      Alkaline Phosphatase 117 (H) U/L      Bilirubin, Total 0.5 mg/dL      Globulin 3.8 (H) g/dL      Albumin/Globulin Ratio 1.1     Anion Gap 15.0    Lipase [[id number]] Collected:  09/13/17 1252    Specimen:  Blood Updated:  09/13/17 1331     Lipase 23 U/L     GFR [[id number]] Collected:  09/13/17 1252     Updated:  09/13/17 1331     EGFR >60.0    Rapid Influenza A/B Antigens [[id number]] Collected:  09/13/17 1252    Specimen:  Nasopharyngeal from Nasal Aspirate Updated:  09/13/17 1320    Narrative:       ORDER#: [id number]                                    ORDERED BY: Tong Pieczynski, BABAT  SOURCE: Nasal Aspirate                               COLLECTED:  09/13/17 12:52  ANTIBIOTICS AT COLL.:                                RECEIVED :  09/13/17 12:59  Influenza Rapid Antigen A&B                FINAL       09/13/17 13:20  09/13/17   Negative for Influenza A and B             Reference Range: Negative      Rapid Strep (Group A Antigen) [[id number]] Collected:  09/13/17 1252    Specimen:  Throat Updated:  09/13/17 1315     Group A Strep, Rapid Antigen Negative     D-Dimer [[id number]] Collected:  09/13/17 1252     Updated:  09/13/17 1312     D-Dimer 0.36 ug/mL FEU     CBC with differential [[id number]]  (Abnormal) Collected:  09/13/17 1252    Specimen:  Blood from Blood Updated:  09/13/17 1302     WBC 9.82 x10 3/uL      Hgb 12.9 g/dL      Hematocrit 08.6 %      Platelets 396 x10 3/uL      RBC 4.88 x10 6/uL      MCV 87.9 fL      MCH 26.4 (L) pg      MCHC 30.1 (L) g/dL      RDW 13 %      MPV 11.0 fL      Neutrophils 62.8 %      Lymphocytes Automated 29.6 %      Monocytes 6.1 %      Eosinophils Automated 0.6 %      Basophils Automated 0.5 %      Immature Granulocyte 0.4 %      Nucleated RBC 0.0 /100 WBC      Neutrophils Absolute 6.16 x10 3/uL      Abs Lymph Automated 2.91 x10 3/uL      Abs Mono Automated 0.60 x10 3/uL      Abs Eos Automated 0.06 x10 3/uL      Absolute Baso Automated 0.05 x10 3/uL      Absolute Immature Granulocyte 0.04 x10 3/uL      Absolute NRBC 0.00 x10 3/uL             MDM  Number of Diagnoses or Management Options  Diagnosis management comments: Chest pain since yesterday and normal ekg:  Check troponin, d-dimer, CXR, flu.  IVF, antiemetics and reassess.       Dispo plan: multiple symptoms with reassuring labs and workup.  I do not think she is having ACS or PE.  BP improved. Follow up with PCP                   Procedures    Clinical Impression & Disposition     Clinical Impression  Final diagnoses:   Hypertension, unspecified type   Chest pain, unspecified type   Myalgia   Paresthesia        ED Disposition     ED Disposition Condition Date/Time Comment    Discharge  Wed Sep 13, 2017  3:26 PM Emily [name] discharge to home/self care.    Condition at disposition: Stable           New Prescriptions    No medications on file                 Genia Hotter, MD  09/13/17 1527

## 2017-09-13 NOTE — Discharge Instructions (Signed)
Hypertension    You have been diagnosed with elevated blood pressure.    The medical term for high blood pressure is hypertension. Many people feel anxious or uncomfortable about being at the hospital. If you feel anxious today, this could make your blood pressure appear high, even if your blood pressure is usually normal. Check your blood pressure several more times when you are not feeling stress. Keep a record of these readings and give this information to your regular doctor. He or she will decide whether you have hypertension that requires medical treatment.    If your blood pressure becomes extremely high all of a sudden, you will probably notice symptoms. In fact, very high blood pressure is a medical emergency. Most people with hypertension have blood pressure that is only a little too high. Mild high blood pressure does not cause specific symptoms. Instead, the effects of hypertension develop slowly over time. Untreated hypertension can affect the heart, brain, kidneys, eyes, and blood vessels. Unfortunately, by the time side-effects become noticeable, the body has already been damaged. This is why hypertension is called "the silent killer!"    It is important to follow up with your regular doctor. Check your blood pressure several times in the next 1 to 2 weeks and tell your doctor about the results. It may be helpful to keep a log or a journal where you can write down your blood pressures. Note the time of day and the activity you were doing when the reading was taken.    YOU SHOULD SEEK MEDICAL ATTENTION IMMEDIATELY, EITHER HERE OR AT THE NEAREST EMERGENCY DEPARTMENT, IF ANY OF THE FOLLOWING OCCURS:   You have a headache.   You have chest pain.   You are short of breath or have trouble breathing.    You feel weak, especially on only one side of the body.   Your symptoms get worse or you have other concerns.

## 2017-09-14 LAB — ECG 12-LEAD
Atrial Rate: 56 {beats}/min
P Axis: 38 degrees
P-R Interval: 178 ms
Q-T Interval: 412 ms
QRS Duration: 92 ms
QTC Calculation (Bezet): 397 ms
R Axis: 30 degrees
T Axis: 10 degrees
Ventricular Rate: 56 {beats}/min

## 2017-09-15 ENCOUNTER — Other Ambulatory Visit (INDEPENDENT_AMBULATORY_CARE_PROVIDER_SITE_OTHER): Payer: Self-pay | Admitting: Cardiology

## 2017-09-15 DIAGNOSIS — I10 Essential (primary) hypertension: Secondary | ICD-10-CM

## 2017-09-15 DIAGNOSIS — R1013 Epigastric pain: Secondary | ICD-10-CM

## 2017-09-25 ENCOUNTER — Encounter (INDEPENDENT_AMBULATORY_CARE_PROVIDER_SITE_OTHER): Payer: Self-pay | Admitting: Cardiology

## 2017-09-25 ENCOUNTER — Ambulatory Visit (INDEPENDENT_AMBULATORY_CARE_PROVIDER_SITE_OTHER): Payer: Medicare Other | Admitting: Cardiology

## 2017-09-25 VITALS — BP 132/80 | HR 61 | Temp 97.6°F | Resp 20 | Ht 63.0 in | Wt 168.0 lb

## 2017-09-25 DIAGNOSIS — R42 Dizziness and giddiness: Secondary | ICD-10-CM

## 2017-09-25 DIAGNOSIS — R072 Precordial pain: Secondary | ICD-10-CM

## 2017-09-25 DIAGNOSIS — I10 Essential (primary) hypertension: Secondary | ICD-10-CM

## 2017-09-25 DIAGNOSIS — R002 Palpitations: Secondary | ICD-10-CM

## 2017-09-25 NOTE — Progress Notes (Signed)
[facility] CARDIOLOGY PROGRESS NOTE    I had the pleasure of seeing Emily [name] today for cardiovascular follow up. She is a pleasant 71 y.o. female with a history of paresthesias with numbness and tingling of the left arm and leg for months with a negative neurologic evaluation at [facility] August 2018 with no acute CVA by MRI and no neck arterial obstruction by CTA and recent ER visit Dec 2018 for hypertension with bp ok now  who presents with paresthesias and chest discomfort. The chest discomfort can occur at any time and is not exertion related and lasts a few seconds at a time every few days and no sob. The palpitations can be almost daily lasting a few seconds. The patient also has lightheadedness that can occur every few days. No syncope.          MEDICATIONS:  Current Outpatient Prescriptions   Medication Sig Dispense Refill   . acetaminophen (TYLENOL) 500 MG tablet Take 500 mg by mouth as needed for Pain.     Marland Kitchen anastrozole (ARIMIDEX) 1 MG tablet Take 1 tablet (1 mg total) by mouth daily. 90 tablet 3   . aspirin 81 MG EC tablet Take 81 mg by mouth daily.     Marland Kitchen ibuprofen (ADVIL,MOTRIN) 600 MG tablet Take 1 tablet (600 mg total) by mouth every 6 (six) hours as needed for Pain.for up to 30 doses 30 tablet 0   . levothyroxine (SYNTHROID, LEVOTHROID) 25 MCG tablet Take 25 mcg by mouth every morning.         Marland Kitchen lisinopril (PRINIVIL,ZESTRIL) 20 MG tablet TAKE ONE TABLET BY MOUTH ONCE DAILY 90 tablet 0   . Multiple Vitamins-Minerals (MULTIVITAMIN WITH MINERALS) tablet Take 1 tablet by mouth daily.     Marland Kitchen omeprazole (PRILOSEC) 10 MG capsule Take 2 capsules (20 mg total) by mouth daily. 90 capsule 3   . simvastatin (ZOCOR) 40 MG tablet Take 40 mg by mouth every morning.         . traMADol (ULTRAM) 50 MG tablet Take 1 tablet (50 mg total) by mouth every 6 (six) hours as needed for Pain.Do not drive or operate machinery while taking this medication 10 tablet 0   . vitamin B-1 (THIAMINE) 100 MG tablet Take 100 mg by mouth every  morning.           REVIEW OF SYSTEMS: All other systems reviewed and negative except as above.    PHYSICAL EXAMINATION  General Appearance: well-appearing and in no acute distress.   Vital Signs: BP 132/80   Pulse 61   Temp 97.6 F (36.4 C) (Tympanic)   Resp 20   Ht 1.6 m (5\' 3" )   Wt 76.2 kg (168 lb)   SpO2 98%   BMI 29.76 kg/m    HEENT: Sclera anicteric, conjunctiva without pallor, moist mucous membranes.  Neck: Supple without jugular venous distention.  Normal carotid upstrokes with sof t left carotid bruit.   Chest: Crackles left base otherwise clear .   Cardiovascular: Normal S1 and  S2 with a 2/6 systolic murmur and no gallops or rub. PMI of normal size and nondisplaced.   Abdomen: Soft, nontender. No organomegaly.  No pulsatile masses or bruits.    Extremities: Warm without edema.  Neuro: Alert and oriented x3. Grossly intact. Strength is symmetrical. Normal mood and affect.     ECG: nsr normal ekg I have personally reviewed and interpreted the EKG/Rhythm.    Past Medical History:   Diagnosis Date   .  Breast cancer 2014    right breast mastectomy   . Hyperlipidemia    . Hypertension    . Malignant neoplasm of overlapping sites of right female breast 10/19/2015     Family History   Problem Relation Age of Onset   . Breast cancer Neg Hx      Social History     Social History   . Marital status: Widowed     Spouse name: N/A   . Number of children: N/A   . Years of education: N/A     Social History Main Topics   . Smoking status: Never Smoker   . Smokeless tobacco: Never Used   . Alcohol use Yes      Comment: socially   . Drug use: No   . Sexual activity: Not Asked     Other Topics Concern   . None     Social History Narrative   . None         IMPRESSION:  Palpitations and lightheadedness.  Atypical chest discomfort  Hypertension controlled      PLAN:   Stress test ekg  MCOT event monitor 1 month.       Royann Shivers, MD [facility]  09/25/2017

## 2017-10-04 ENCOUNTER — Ambulatory Visit (INDEPENDENT_AMBULATORY_CARE_PROVIDER_SITE_OTHER): Payer: Medicare Other | Admitting: Cardiovascular Disease

## 2017-10-04 DIAGNOSIS — R072 Precordial pain: Secondary | ICD-10-CM

## 2017-10-04 DIAGNOSIS — R002 Palpitations: Secondary | ICD-10-CM

## 2017-10-04 DIAGNOSIS — R42 Dizziness and giddiness: Secondary | ICD-10-CM

## 2017-10-04 IMAGING — CT CT ANGIO CHEST
1 of 8 series · 19 of 36 positions shown · IV contrast (ISOVUE 370)
Comparison: Two-view chest x-ray from the same day.

CLINICAL DATA: Chest pain and palpitations beginning last night.
Personal history of breast cancer with right mastectomy. Shortness
of breath and cough.

EXAM:
CT ANGIOGRAPHY CHEST WITH CONTRAST
TECHNIQUE: Multidetector CT imaging of the chest was performed using the
standard protocol during bolus administration of intravenous
contrast. Multiplanar CT image reconstructions and MIPs were
obtained to evaluate the vascular anatomy.
CONTRAST:  82 mL Isovue 370.

[Series 6: thins for pacs · axial · 0.63mm/px · z∈[+1268,+1475]mm · 19 of 231 slices shown]
[im 12/231  lung]
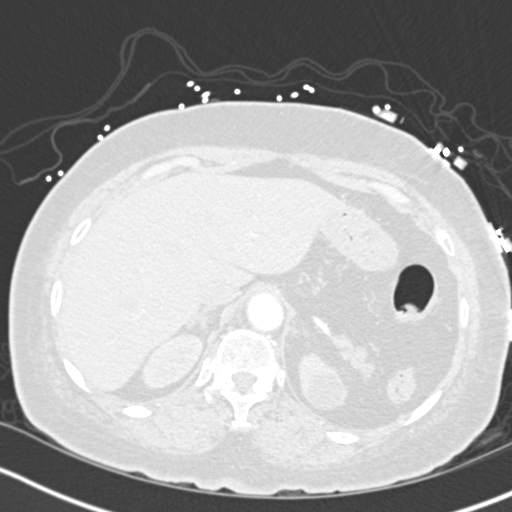
[im 24/231  mediastinal]
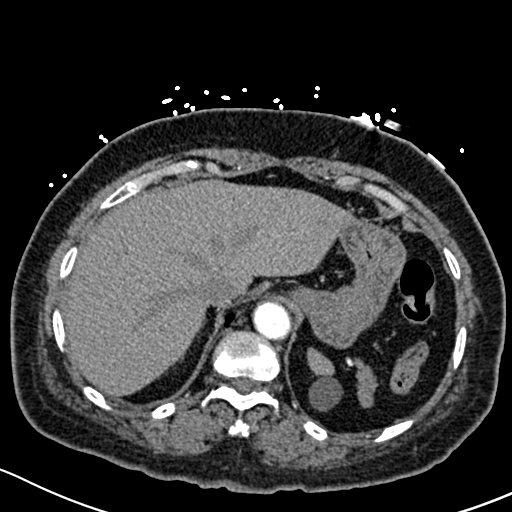
[im 35/231  lung]
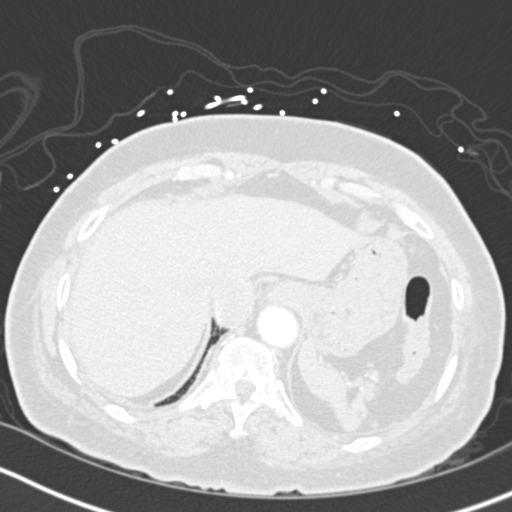
[im 47/231  mediastinal]
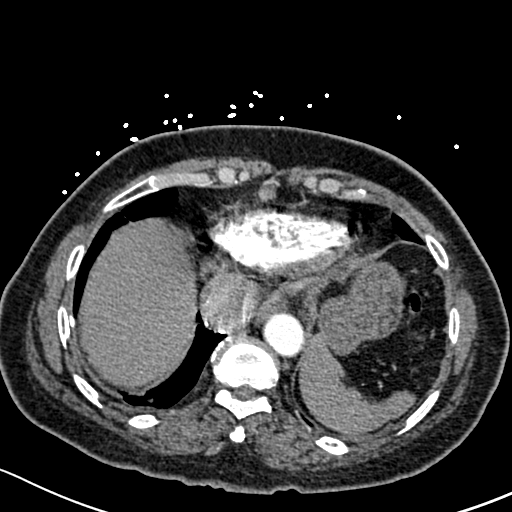
[im 58/231  lung]
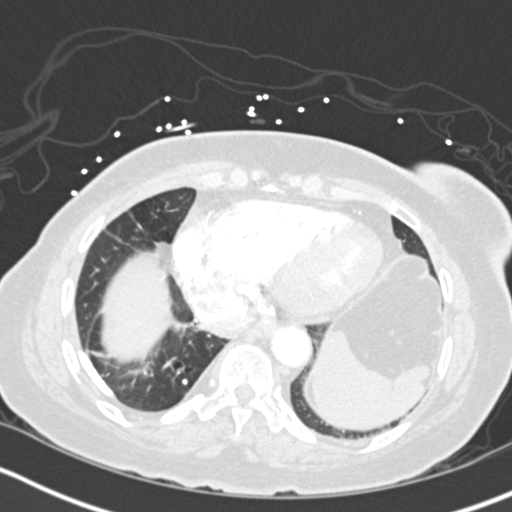
[im 70/231  mediastinal]
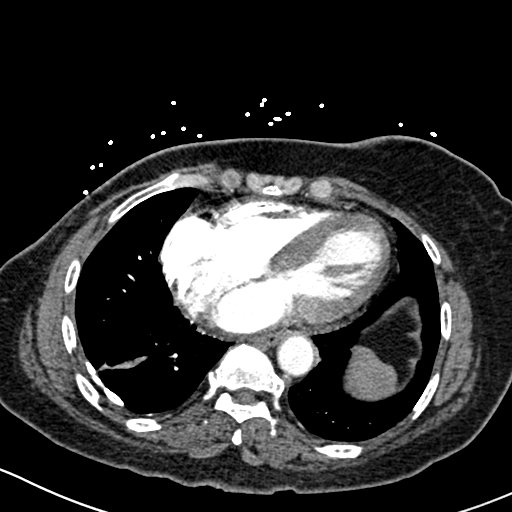
[im 81/231  lung]
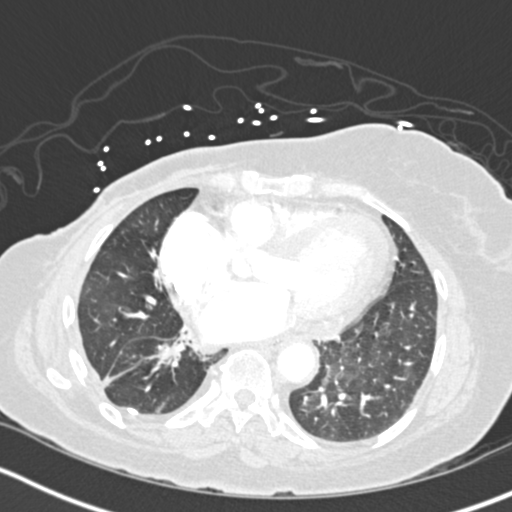
[im 93/231  mediastinal]
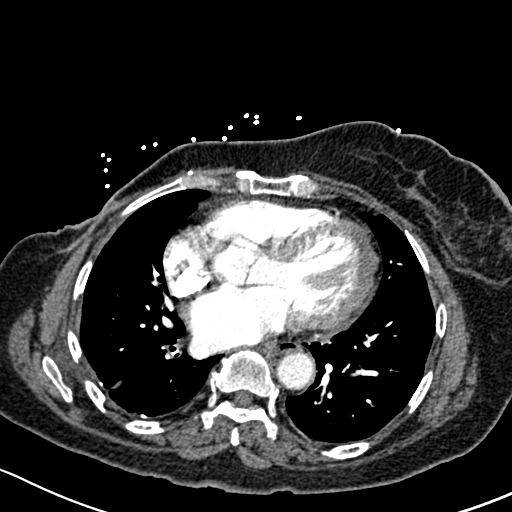
[im 104/231  lung]
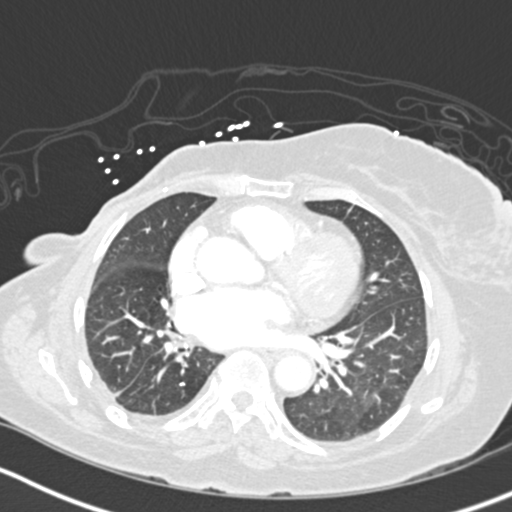
[im 116/231  mediastinal]
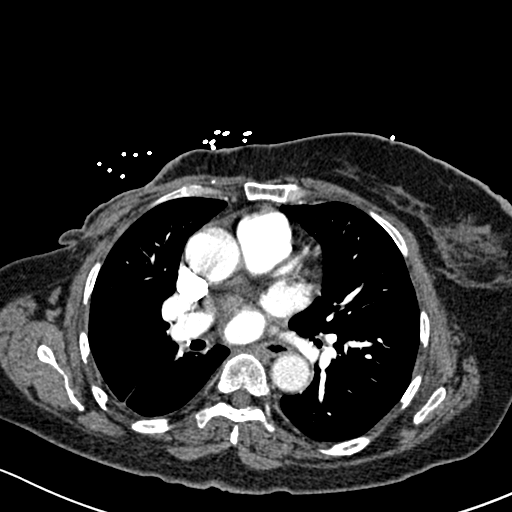
[im 127/231  lung]
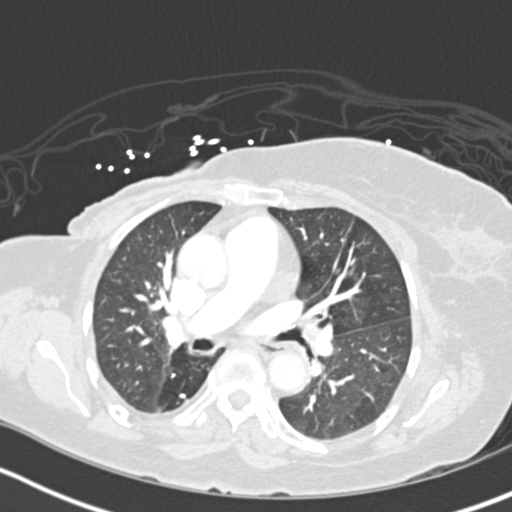
[im 139/231  mediastinal]
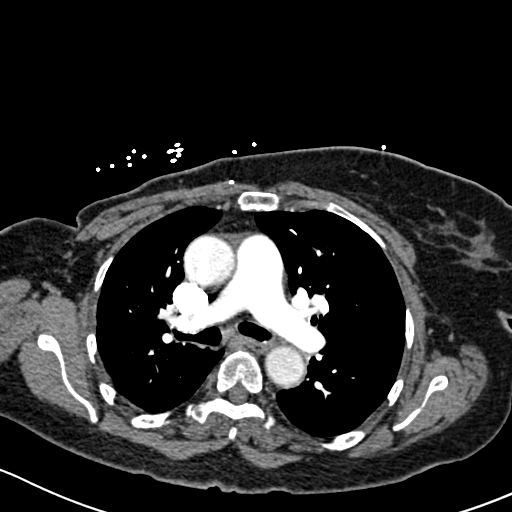
[im 150/231  lung]
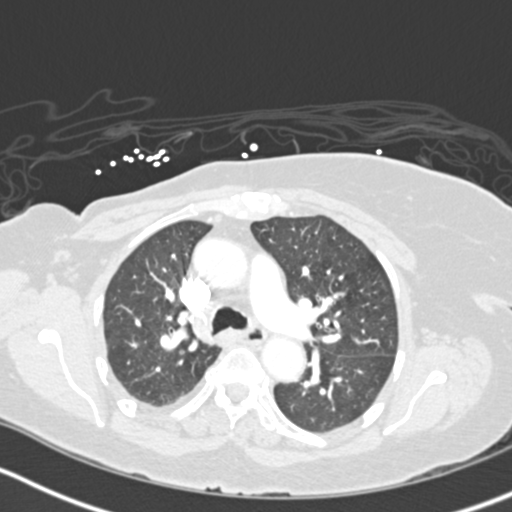
[im 162/231  mediastinal]
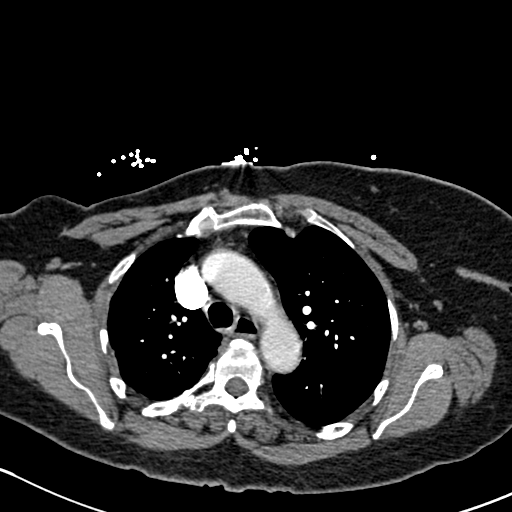
[im 173/231  lung]
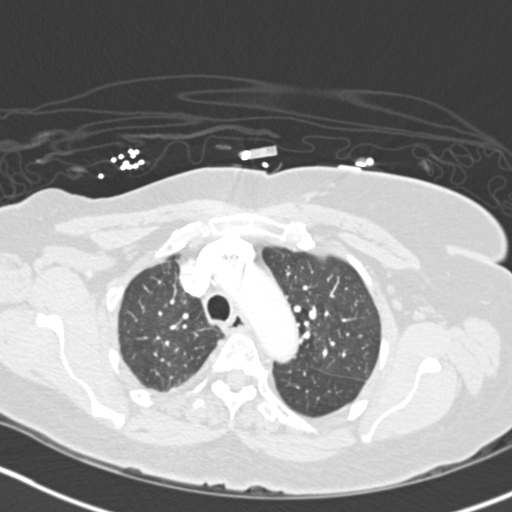
[im 185/231  mediastinal]
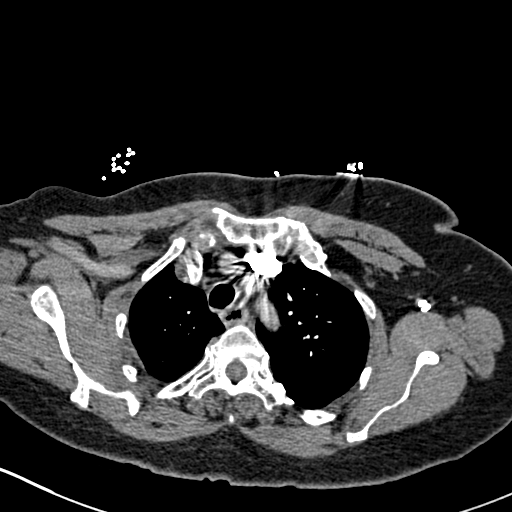
[im 196/231  lung]
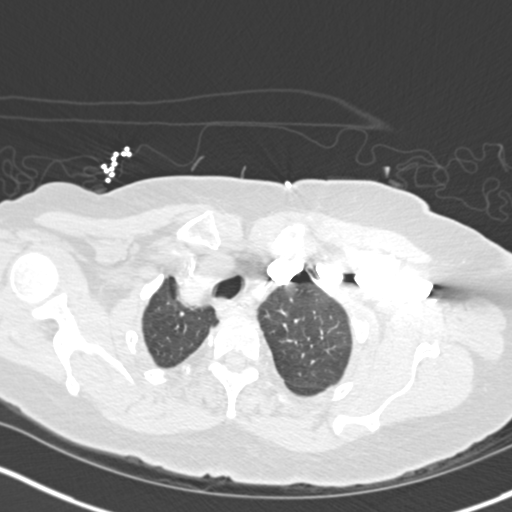
[im 208/231  mediastinal]
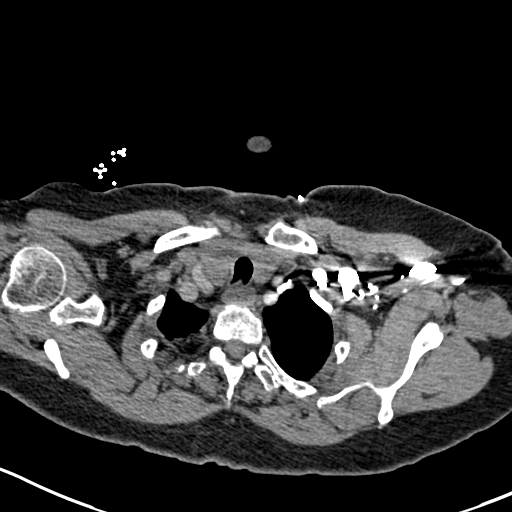
[im 219/231  lung]
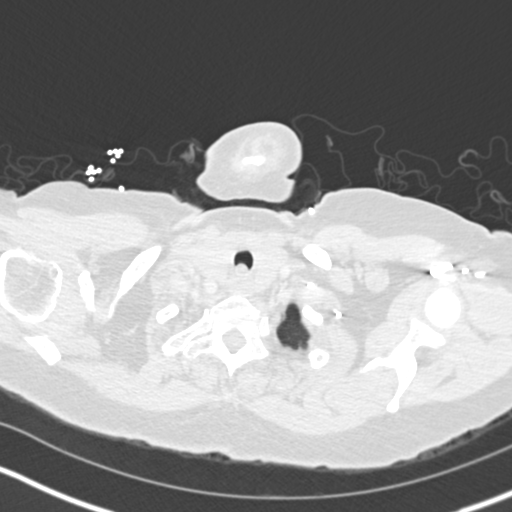

[19 of 36 positions shown; findings below may reference images not displayed]

FINDINGS: Cardiovascular: The heart is mildly enlarged. No significant
pericardial effusion is present. Minimal calcification is present at
the aortic arch. Great vessel origins are within normal limits.

Pulmonary artery is are of normal size. Pulmonary opacification is
satisfactory. There are no focal filling defects to suggest
pulmonary embolus.

Mediastinum/Nodes: No significant mediastinal or axillary adenopathy
is present.

Lungs/Pleura: Calcified nodules are present in the right lower lobe.
Linear scarring is present at the right lower lobe as well. No other
significant airspace disease is present. No focal nodule or mass
lesion.

Upper Abdomen: A 2.5 cm simple cyst is present at the upper pole of
the left kidney. Limited imaging of the upper abdomen is otherwise
unremarkable.

Musculoskeletal: Mild endplate changes are per. No focal lytic or
blastic lesions are present. The ribs are unremarkable.

Review of the MIP images confirms the above findings.
IMPRESSION: 1. No evidence pulmonary embolus.
2. Calcifications in the right lower lobe compatible with prior
granulomatous infection. No significant associated adenopathy is
present.
3. No other significant airspace disease is present.
4. Mild cardiomegaly without failure.
5. Right mastectomy.

## 2017-10-04 NOTE — Procedures (Signed)
EXERCISE STRESS TEST REPORT    [facility] - [facility]  Tel [phone]      PATIENT:  Emily [name]         MRN: [id number].  Gender: female.  DOB: [birth year].  Age: 72 y.o.    Date of study:  10/04/17    Ordering Physician:  Denyse Dago, MD  Primary Physician:  Ramon Dredge, MD  Primary Cardiologist:  Denyse Dago, MD    INDICATION:    1. Precordial pain    2. Palpitations    3. Lightheadedness         DATA:   Resting ECG:  Sinus bradycardia.      Protocol:  Bruce   Exercise time:  2 minutes 36 seconds.   Vitals at rest:  57 bpm, 138/58 mmHg  Vitals at peak exercise:  140 bpm, 180/80 mmHg    % of age predicted maximum heart rate:  94%   METS achieved: 4.0  Symptoms: fatigue    Reason for stopping exercise:  fatigue  Achieved target heart rate:  yes   ST changes at peak exercise:  None  Arrhythmias:  PVC's  Recovery:  No chest pain    IMPRESSION:    Submaximal Exercise Treadmill stress test due to inadequate workload and hence non diagnostic   Poor Exercise tolerance   Occasional PVC's      Interpreted and electronically signed by:  Bari Edward, MD  [facility], [facility] [place] [facility] - [facility]

## 2017-10-05 ENCOUNTER — Encounter (INDEPENDENT_AMBULATORY_CARE_PROVIDER_SITE_OTHER): Payer: Self-pay | Admitting: Cardiovascular Disease

## 2017-10-16 ENCOUNTER — Ambulatory Visit (INDEPENDENT_AMBULATORY_CARE_PROVIDER_SITE_OTHER): Payer: Medicare Other | Admitting: Cardiology

## 2017-10-23 ENCOUNTER — Other Ambulatory Visit: Payer: Self-pay | Admitting: Family Medicine

## 2017-10-23 DIAGNOSIS — R928 Other abnormal and inconclusive findings on diagnostic imaging of breast: Secondary | ICD-10-CM

## 2017-10-26 ENCOUNTER — Ambulatory Visit
Admission: RE | Admit: 2017-10-26 | Discharge: 2017-10-26 | Disposition: A | Discharge status: Home / Self Care | Payer: Medicare Other | Source: Ambulatory Visit | Attending: Family Medicine | Admitting: Family Medicine

## 2017-10-26 DIAGNOSIS — R928 Other abnormal and inconclusive findings on diagnostic imaging of breast: Secondary | ICD-10-CM

## 2017-11-07 ENCOUNTER — Ambulatory Visit
Admission: RE | Admit: 2017-11-07 | Discharge: 2017-11-07 | Disposition: A | Discharge status: Home / Self Care | Payer: Medicare Other | Source: Ambulatory Visit | Attending: Family Medicine | Admitting: Family Medicine

## 2017-11-07 ENCOUNTER — Other Ambulatory Visit: Payer: Self-pay | Admitting: Family Medicine

## 2017-11-07 DIAGNOSIS — R52 Pain, unspecified: Secondary | ICD-10-CM

## 2017-11-07 DIAGNOSIS — R928 Other abnormal and inconclusive findings on diagnostic imaging of breast: Secondary | ICD-10-CM

## 2017-11-10 ENCOUNTER — Ambulatory Visit (INDEPENDENT_AMBULATORY_CARE_PROVIDER_SITE_OTHER): Payer: Medicare Other | Admitting: Cardiology

## 2017-11-24 ENCOUNTER — Other Ambulatory Visit (INDEPENDENT_AMBULATORY_CARE_PROVIDER_SITE_OTHER): Payer: Self-pay | Admitting: Cardiology

## 2017-11-24 DIAGNOSIS — R1013 Epigastric pain: Secondary | ICD-10-CM

## 2017-11-24 DIAGNOSIS — I10 Essential (primary) hypertension: Secondary | ICD-10-CM

## 2017-11-28 ENCOUNTER — Telehealth (INDEPENDENT_AMBULATORY_CARE_PROVIDER_SITE_OTHER): Payer: Self-pay | Admitting: Cardiology

## 2017-12-12 NOTE — EDIE (Signed)
COLLECTIVE?NOTIFICATION?12/12/2017 16:17?Emily [name], [name]?MRN: [id number]    [place] [facility]'s patient encounter information:   VWU:[id number]  Account [id number]  Billing Account [id number]      Criteria Met      5 ED Visits in 12 Months    Security and Safety  No recent Security Events currently on file    ED Care Guidelines  There are currently no ED Care Guidelines for this patient. Please check your facility's medical records system.      Prescription Drug Report (12 Mo.)  PDMP query found no report.      E.D. Visit Count (12 mo.)  Facility Visits   [place] [facility] 5   Total 5   Note: Visits indicate total known visits.      Recent Emergency Department Visit Summary  Date Facility [facility] Type Diagnoses or Chief Complaint   Dec 12, 2017 [place] [facility]      Triage- Fall      Sep 13, 2017 [place] [facility] [place] [facility]      body aches      Nausea      Generalized Body Aches      Dizziness      Chest pain, unspecified      Essential (primary) hypertension      Myalgia, unspecified site      Paresthesia of skin      Jul 10, 2017 [place] [facility] [place] [facility]      Dizziness      Numbness      Chest Pain      Other chest pain      Other chronic pain      May 26, 2017 [place] [facility] [place] [facility]      triage- left sided numbness, extremity weakness      Tingling      Jan 18, 2017  - [facility] [place] [facility]      Palpitations      Essential (primary) hypertension          Recent Inpatient Visit Summary  No recorded inpatient visits.     Care Providers  There are no care providers on record at this time.   Collective Portal  This patient has registered at the [facility] Emergency Department   For more information visit: [email or web address]     The above information is provided for the sole purpose of patient  treatment. Use of this information beyond the terms of Data Sharing Memorandum of Understanding and License Agreement is prohibited. In certain cases not all visits may be represented. Consult the aforementioned facilities for additional information.   ? 2019 [employer] - [place], [place] - [email or web address]

## 2017-12-13 ENCOUNTER — Encounter (INDEPENDENT_AMBULATORY_CARE_PROVIDER_SITE_OTHER): Payer: Self-pay | Admitting: Cardiology

## 2017-12-13 ENCOUNTER — Ambulatory Visit (INDEPENDENT_AMBULATORY_CARE_PROVIDER_SITE_OTHER): Payer: Medicare Other | Admitting: Cardiology

## 2017-12-13 VITALS — BP 150/80 | HR 55 | Temp 98.3°F | Resp 17 | Ht 63.0 in | Wt 167.8 lb

## 2017-12-13 DIAGNOSIS — R002 Palpitations: Secondary | ICD-10-CM

## 2017-12-13 DIAGNOSIS — I10 Essential (primary) hypertension: Secondary | ICD-10-CM

## 2017-12-13 DIAGNOSIS — R1013 Epigastric pain: Secondary | ICD-10-CM

## 2017-12-13 MED ORDER — SIMVASTATIN 40 MG PO TABS
40.00 mg | ORAL_TABLET | Freq: Every morning | ORAL | 3 refills | Status: DC
Start: 2017-12-13 — End: 2018-03-15

## 2017-12-13 MED ORDER — LISINOPRIL 20 MG PO TABS
20.00 mg | ORAL_TABLET | Freq: Two times a day (BID) | ORAL | 3 refills | Status: DC
Start: 2017-12-13 — End: 2018-05-15

## 2017-12-13 NOTE — Progress Notes (Signed)
[facility] CARDIOLOGY PROGRESS NOTE    I had the pleasure of seeing Emily [name] today for cardiovascular follow up. She is a pleasant 72 y.o. female with a history of hypertension and 2 second palpitations 2-3 times a month without lightheadedness with a negative ekg stress test 1/19 and a previous CT chest with calcified granulomas and rt inf lung scarring who presents for follow up.      Home bp runs above 140 at times.       MEDICATIONS:  Current Outpatient Prescriptions   Medication Sig Dispense Refill   . acetaminophen (TYLENOL) 500 MG tablet Take 500 mg by mouth as needed for Pain.     Marland Kitchen anastrozole (ARIMIDEX) 1 MG tablet Take 1 tablet (1 mg total) by mouth daily. 90 tablet 3   . aspirin 81 MG EC tablet Take 81 mg by mouth daily.     Marland Kitchen ibuprofen (ADVIL,MOTRIN) 600 MG tablet Take 1 tablet (600 mg total) by mouth every 6 (six) hours as needed for Pain.for up to 30 doses 30 tablet 0   . levothyroxine (SYNTHROID, LEVOTHROID) 25 MCG tablet Take 25 mcg by mouth every morning.         Marland Kitchen lisinopril (PRINIVIL,ZESTRIL) 20 MG tablet Take 1 tablet (20 mg total) by mouth 2 (two) times daily. 180 tablet 3   . Multiple Vitamins-Minerals (MULTIVITAMIN WITH MINERALS) tablet Take 1 tablet by mouth daily.     Marland Kitchen omeprazole (PRILOSEC) 10 MG capsule Take 2 capsules (20 mg total) by mouth daily. 90 capsule 3   . simvastatin (ZOCOR) 40 MG tablet Take 1 tablet (40 mg total) by mouth every morning. 90 tablet 3   . traMADol (ULTRAM) 50 MG tablet Take 1 tablet (50 mg total) by mouth every 6 (six) hours as needed for Pain.Do not drive or operate machinery while taking this medication 10 tablet 0   . vitamin B-1 (THIAMINE) 100 MG tablet Take 100 mg by mouth every morning.           REVIEW OF SYSTEMS: All other systems reviewed and negative except as above.    PHYSICAL EXAMINATION  General Appearance: well-appearing and in no acute distress.   Vital Signs: BP 150/80 (BP Site: Left arm, Patient Position: Sitting, Cuff Size: Small)   Pulse (!)  55   Temp 98.3 F (36.8 C) (Tympanic)   Resp 17   Ht 1.6 m (5\' 3" )   Wt 76.1 kg (167 lb 12.8 oz)   SpO2 98%   BMI 29.72 kg/m    HEENT: Sclera anicteric, conjunctiva without pallor, moist mucous membranes.  Neck: Supple without jugular venous distention.    Chest: Crackles left base and rales right base  Cardiovascular: Normal S1 and  S2 without murmurs, gallops or rub. PMI of normal size and nondisplaced.     Extremities: Warm without edema.  Neuro: Alert and oriented x3. Grossly intact. Strength is symmetrical. Normal mood and affect.     ECG: nsr rate 55 I have personally reviewed and interpreted the EKG/Rhythm.    Past Medical History:   Diagnosis Date   . Breast cancer 2014    right breast mastectomy   . Hyperlipidemia    . Hypertension    . Malignant neoplasm of overlapping sites of right female breast 10/19/2015     Family History   Problem Relation Age of Onset   . Breast cancer Neg Hx      Social History     Social History   .  Marital status: Widowed     Spouse name: N/A   . Number of children: N/A   . Years of education: N/A     Social History Main Topics   . Smoking status: Never Smoker   . Smokeless tobacco: Never Used   . Alcohol use 0.6 oz/week     1 Cans of beer per week      Comment: socially   . Drug use: No   . Sexual activity: Not Asked     Other Topics Concern   . None     Social History Narrative   . None         IMPRESSION:  Hypertension with bp on the high side  Scarring and granulomas of the lung with rales on exam  Rare palpitations without near syncope - no further evaluation is required.      PLAN:   Increase lisinopril to 20 bid  Call if bp is consistently above 140/90  Recheck bp in office 3 mos      Royann Shivers, MD [facility]  12/13/2017

## 2017-12-13 NOTE — Patient Instructions (Signed)
INCREASE LISINOPRIL TO 20 MG TWICE A DAY

## 2018-01-02 ENCOUNTER — Other Ambulatory Visit: Payer: Self-pay | Admitting: Family Medicine

## 2018-01-02 ENCOUNTER — Ambulatory Visit
Admission: RE | Admit: 2018-01-02 | Discharge: 2018-01-02 | Disposition: A | Discharge status: Home / Self Care | Payer: Medicare Other | Source: Ambulatory Visit | Attending: Family Medicine | Admitting: Family Medicine

## 2018-01-02 DIAGNOSIS — M79662 Pain in left lower leg: Secondary | ICD-10-CM | POA: Insufficient documentation

## 2018-01-17 NOTE — Progress Notes (Signed)
ONCOLOGY HISTORY   Emily [name] is a 72 y.o. female with a history of right breast cancer, status post mastectomy, who presented to establish care in January 2017.  The patient was previously followed at [facility], but transferred care to be closer to her home in [place]..      Per records, in 2014, the patient underwent routine mammographic screening on 08/14/13, with subsequent stereotactic guided core biopsy on 09/02/2013.  Pathology report revealed an infiltrating ductal carcinoma 1.2 cm, grade 2 with associated DCIS.  The patient underwent right total mastectomy and sentinel node biopsy.  Invasive tumor profile was ER positive, PR positive, HER-2/neu negative, Ki-67 20%. Sentinel node was negative. The patient's postoperative course was complicated by an infection at the expander site. The expander ultimately had to be removed.  She has undergone a total of 2 surgeries, and has been unable to have reconstructive surgery since.    The patient was placed on endocrine therapy with an aromatase inhibitor (anastrozole), which she is tolerating fairly well.  She has had consistent follow-up and screening, with no evidence of cancer recurrence. She underwent left-sided mammogram in September 2016, which was negative.       The patient states that she continues to have some problems with memory loss, which she feels occurred after the surgical procedures.  She is unable to remember some of the details of her surgical course.  She reports that she has intermittent discomfort at the right mastectomy site that is alleviated by Tylenol, ibuprofen or ice packs.  The areas of skin on the lateral aspect catch on her clothing and is uncomfortable for her.  She notes that sometimes the pain is related to activities such as cooking and doing household chores. She has noted no mass or skin changes at the mastectomy site.  She otherwise denies active complaints.      Interval History 01/19/2018:  The patient presents for a  routine clinic follow-up visit.  His last visit, she underwent ultrasound breast bilateral on November 07, 2017 in the setting of pain at the mastectomy site and left breast pain.  Circumscribed mass in the high left axilla which is been present on mammograms when compared to 2014. 6 month follow-up is recommended to ensure stability.  No evidence of mass in the mastectomy bed. Benign lymph node in the right axilla.    She notes that her breasts are "feeling funny."  The right breast is feeling tight.  The area in the left breast axillary region burst open after the [place], with white exudate.       She is tolerating the anastrozole well with no adverse side effects.  She is still considering seeking consultation with a plastic surgeon, but hasn't done it yet.      She reports persistent limited range of motion in the right upper extremity and a sensation of tightness when she raises her arm, but otherwise reports feeling well overall and denies active complaints.  She does her own exercises.    She denies headache, lightheadedness, changes in vision, tinnitus, sore throat, cough, shortness of breath, chest pain, palpitations, abdominal pain, nausea, emesis, dysuria, changes in bowel habits, numbness/tingling, edema, fever/chills, fatigue, night sweats, unintentional weight loss, easy bruising, prolonged bleeding or evidence of active bleeding.      Allergies   Allergen Reactions   . Caffeine      Personal preference   . Morphine Itching   . Morphine And Related Itching and Nausea And Vomiting  dizzy     Current Outpatient Prescriptions   Medication Sig Dispense Refill   . acetaminophen (TYLENOL) 500 MG tablet Take 500 mg by mouth as needed for Pain.     Marland Kitchen anastrozole (ARIMIDEX) 1 MG tablet Take 1 tablet (1 mg total) by mouth daily. 90 tablet 3   . aspirin 81 MG EC tablet Take 81 mg by mouth daily.     Marland Kitchen ibuprofen (ADVIL,MOTRIN) 600 MG tablet Take 1 tablet (600 mg total) by mouth every 6 (six) hours as needed for  Pain.for up to 30 doses 30 tablet 0   . levothyroxine (SYNTHROID, LEVOTHROID) 25 MCG tablet Take 25 mcg by mouth every morning.         Marland Kitchen lisinopril (PRINIVIL,ZESTRIL) 20 MG tablet Take 1 tablet (20 mg total) by mouth 2 (two) times daily. 180 tablet 3   . Multiple Vitamins-Minerals (MULTIVITAMIN WITH MINERALS) tablet Take 1 tablet by mouth daily.     Marland Kitchen omeprazole (PRILOSEC) 10 MG capsule Take 2 capsules (20 mg total) by mouth daily. 90 capsule 3   . simvastatin (ZOCOR) 40 MG tablet Take 1 tablet (40 mg total) by mouth every morning. 90 tablet 3   . traMADol (ULTRAM) 50 MG tablet Take 1 tablet (50 mg total) by mouth every 6 (six) hours as needed for Pain.Do not drive or operate machinery while taking this medication 10 tablet 0   . vitamin B-1 (THIAMINE) 100 MG tablet Take 100 mg by mouth every morning.       No current facility-administered medications for this visit.      Past Medical History:   Diagnosis Date   . Breast cancer 2014    right breast mastectomy   . Hyperlipidemia    . Hypertension    . Malignant neoplasm of overlapping sites of right female breast 10/19/2015     Past Surgical History:   Procedure Laterality Date   . HYSTERECTOMY     . MASTECTOMY Right 2015     Family History   Problem Relation Age of Onset   . Breast cancer Neg Hx      Social History     Social History   . Marital status: Widowed     Spouse name: N/A   . Number of children: N/A   . Years of education: N/A     Occupational History   . Not on file.     Social History Main Topics   . Smoking status: Never Smoker   . Smokeless tobacco: Never Used   . Alcohol use 0.6 oz/week     1 Cans of beer per week      Comment: socially   . Drug use: No   . Sexual activity: Not on file     Other Topics Concern   . Not on file     Social History Narrative   . No narrative on file     Review of Systems  All systems were reviewed and are negative except for that which is mentioned in the history of present illness.    Objective:     There were no vitals  filed for this visit.     Physical Exam   Constitutional:  Well developed, well nourished, no acute distress, non-toxic appearance   Eyes:  PERRL, conjunctiva normal   HENT:  Atraumatic, external ears normal, nose normal, oropharynx moist, no pharyngeal exudates. Neck- normal range of motion, no tenderness, supple   Respiratory:  No respiratory distress, normal  breath sounds, no rales, no wheezing   Cardiovascular:  Normal rate, normal rhythm, no murmurs, no gallops, no rubs   Breast:  Right breast surgically absent with well-healed mastectomy scar. Flaps have some redundancy medially and laterally.  Right axilla and supraclavicular fossa are unremarkable. She has fairly good range of motion, but some skin tethering at extension of the arm at the mastectomy site. Left breast is intact. There are no obvious skin or nipple lesions and no discrete dominant mass. Left axillary region with 0.5cm known sebaceous cyst as per [place], able to express white discharge  GI:  Soft, nondistended, normal bowel sounds, nontender, no organomegaly, no mass, no rebound, no guarding   GU:  No costovertebral angle tenderness   Musculoskeletal:  No edema, no tenderness, no deformities. Back- no tenderness  Integument:  Well hydrated, no rash   Lymphatic:  No lymphadenopathy noted   Neurologic:  Alert & oriented x 3, CN 2-12 normal, normal motor function, normal sensory function, no focal deficits noted   Psychiatric:  Speech and behavior appropriate     RADIOLOGY DATA:   Mammography screening bilateral 08/14/2013  FINDINGS:  Bilateral CC and MLO projection digital mammograms were obtained. Tomosynthesis was not performed.     Suspicious and potentially malignant areas of microcalcification have developed in 2 separate regions within the right breast since prior study performed on 12/16/2011. Consideration for biopsy of these 2  regions is suggested. One of the areas of developing microcalcifications is located in the upper lateral portion of  the right breast. The other area of developing microcalcification is in the inferior medial portion  of the right breast. The left breast appears unremarkable.       IMPRESSION:     2 areas of microcalcifications have developed in the right breast. One of these regions is in the upper outer portion of the right breast. The other region is in the inferior medial portion of the right  breast. These areas would be amenable to stereotactic biopsy and biopsy is suggested.     BI-RADS Category 4, biopsy suggested of 2 separate regions of microcalcifications within the right breast.    PATHOLOGY DATA:   09/11/2013 Surgical Pathology Report,  Right Breast Tissue       A.  RIGHT BREAST CALCIFICATIONS, NEEDLE BIOPSIES:          -  DUCTAL CARCINOMA IN SITU, HIGH GRADE, COMEDO TYPE.          -  MICROCALCIFICATIONS PRESENT WITHIN THE TUMOR.        B  RIGHT BREAST CALCIFICATIONS, NEEDLE BIOPSIES:         -  DUCTAL CARCINOMA IN SITU, HIGH GRADE, COMEDO TYPE, WITH    MULTIPLE FOCI                 OF MICROINVASION.         -  MICROCALCIFICATIONS PRESENT WITHIN THE TUMOR.     ER    Percent Positive: 99.60%  Positive        Stain Intensity: 3    PR    Percent Positive: 33.48%  Positive        Stain Intensity: 1    Her2/neu   Stain Intensity: 2+   Equivocal    Ki-67    Percent Positive: 17%   Borderline       Assessment & Plan:   72 year old female with history of right-sided breast cancer, T1N0M0, status post right total mastectomy and SLNB, currently  on endocrine therapy, presenting for clinic follow up visit.    The patient is currently on a 5 year course of endocrine therapy, started early 2015, to be completed in 2020, with anastrozole 1 mg by mouth daily, which she is tolerating well.  She reports that the medication co-pay is costly.  We will explore financial assistance options for her.  Refill today.    DEXA scan performed in July 2018 revealed osteopenia.  Given the potential side effect of decreased bone density with  aromatase inhibitors, the patient would be indicated to start a bisphosphonate.  We previously discussed Reclast (zoledronic acid) once yearly.      She previously underwent a dental evaluation, given the risk of osteonecrosis of the jaw with bisphosphonates, with recommendation for an elective root canal. The patient chose not to undergo any dental procedures, and has not decided otherwise as of today.  Encouraged to continue vitamin D and calcium supplementation.    The patient remains troubled with the discomfort at the right mastectomy site. She has previously expressed that she might wish to have revision and reconsideration of additional reconstruction.    She was previously referred to plastic surgery for further evaluation Jim Like, MD in Plastic Surgery at [facility]).  She still wishes to consider this.  Referred again today.    Referred to physical therapy for further evaluation and management, given her limited range of motion.  She reports that she did not go and does her own PT at home.    Left axillary palpable region: It was unclear to the patient whether there was some type of surgical procedure performed in the area versus lymphadenopathy.  The patient has no recollection of that timeframe.  [place] Left axilla on March 14, 2016 revealed a sebaceous cyst in the left axilla.  No left axillary lymphadenopathy.  Able to express white exudate today.  She may consider discussing this with surgery for removal.     Surveillance:  Left diagnostic mammogram 11/07/2017: No mammographic evidence of malignancy in the left breast or significant change from the prior exam.    US breast bilateral on November 07, 2017: Circumscribed mass in the high left axilla which is been present on mammograms when compared to 2014. No evidence of mass in the mastectomy bed. Benign lymph node in the right axilla.    Recommendation: 6 month follow-up left breast axillary ultrasound to ensure stability of the superficial complex lesion.     Ordered today.    To return to clinic in 4 months.       Total visit time more than 40 minutes, of which more than 50% was spent in counseling regarding diagnosis, treatment options, potential side effects, reviewing records and scans, coordination of care, including communications with pharmacy and nursing, and completing the medical record.

## 2018-01-19 ENCOUNTER — Ambulatory Visit (INDEPENDENT_AMBULATORY_CARE_PROVIDER_SITE_OTHER): Payer: Medicare Other | Admitting: Hematology & Oncology

## 2018-01-19 ENCOUNTER — Telehealth (INDEPENDENT_AMBULATORY_CARE_PROVIDER_SITE_OTHER): Payer: Self-pay

## 2018-01-19 ENCOUNTER — Encounter (INDEPENDENT_AMBULATORY_CARE_PROVIDER_SITE_OTHER): Payer: Self-pay | Admitting: Hematology & Oncology

## 2018-01-19 DIAGNOSIS — C50811 Malignant neoplasm of overlapping sites of right female breast: Secondary | ICD-10-CM

## 2018-01-19 DIAGNOSIS — Z170 Estrogen receptor positive status [ER+]: Secondary | ICD-10-CM

## 2018-01-19 DIAGNOSIS — I10 Essential (primary) hypertension: Secondary | ICD-10-CM

## 2018-01-19 DIAGNOSIS — L723 Sebaceous cyst: Secondary | ICD-10-CM

## 2018-01-19 MED ORDER — ANASTROZOLE 1 MG PO TABS: 1.0000 mg | ORAL_TABLET | Freq: Every day | ORAL | 3 refills | Status: DC

## 2018-01-19 NOTE — Telephone Encounter (Signed)
Unable to reach pt. Left message to call back regarding discount pharmacy for anastrozole.

## 2018-01-19 NOTE — Progress Notes (Signed)
Spoke with patient. Made aware that [facility] can provide discount for anastrozole for 30$ for 90 days supply. Patient said that it's still too expensive. Stated that she get cheaper price from [facility]. Made aware that she can go to [facility] and get discount with them if they provide cheaper price. But if she is interested in getting with [facility], she needs to call them at [phone] to enroll. Instruct for pt to call back if she decided to enroll with RX outreach. We can sent script electronically to them.

## 2018-02-07 ENCOUNTER — Emergency Department
Admission: EM | Admit: 2018-02-07 | Discharge: 2018-02-07 | Disposition: A | Discharge status: Home / Self Care | Payer: Medicare Other | Attending: Emergency Medicine | Admitting: Emergency Medicine

## 2018-02-07 ENCOUNTER — Emergency Department: Payer: Medicare Other

## 2018-02-07 DIAGNOSIS — J209 Acute bronchitis, unspecified: Secondary | ICD-10-CM | POA: Insufficient documentation

## 2018-02-07 DIAGNOSIS — Z79899 Other long term (current) drug therapy: Secondary | ICD-10-CM | POA: Insufficient documentation

## 2018-02-07 DIAGNOSIS — Z7982 Long term (current) use of aspirin: Secondary | ICD-10-CM | POA: Insufficient documentation

## 2018-02-07 DIAGNOSIS — C50811 Malignant neoplasm of overlapping sites of right female breast: Secondary | ICD-10-CM | POA: Insufficient documentation

## 2018-02-07 DIAGNOSIS — I10 Essential (primary) hypertension: Secondary | ICD-10-CM | POA: Insufficient documentation

## 2018-02-07 DIAGNOSIS — E785 Hyperlipidemia, unspecified: Secondary | ICD-10-CM | POA: Insufficient documentation

## 2018-02-07 LAB — COMPREHENSIVE METABOLIC PANEL
ALT: 10 U/L (ref 0–55)
AST (SGOT): 20 U/L (ref 5–34)
Albumin/Globulin Ratio: 1.5 (ref 0.9–2.2)
Albumin: 4.3 g/dL (ref 3.5–5.0)
Alkaline Phosphatase: 108 U/L — ABNORMAL HIGH (ref 37–106)
Anion Gap: 11 (ref 5.0–15.0)
BUN: 17 mg/dL (ref 7–19)
Bilirubin, Total: 0.2 mg/dL (ref 0.2–1.2)
CO2: 25 mEq/L (ref 22–29)
Calcium: 9.3 mg/dL (ref 7.9–10.2)
Chloride: 109 mEq/L (ref 100–111)
Creatinine: 0.9 mg/dL (ref 0.6–1.0)
Globulin: 2.9 g/dL (ref 2.0–3.6)
Glucose: 109 mg/dL — ABNORMAL HIGH (ref 70–100)
Potassium: 3.8 mEq/L (ref 3.5–5.1)
Protein, Total: 7.2 g/dL (ref 6.0–8.3)
Sodium: 145 mEq/L (ref 136–145)

## 2018-02-07 LAB — URINALYSIS, REFLEX TO MICROSCOPIC EXAM IF INDICATED
Bilirubin, UA: NEGATIVE
Blood, UA: NEGATIVE
Glucose, UA: NEGATIVE
Ketones UA: NEGATIVE
Nitrite, UA: NEGATIVE
Protein, UR: NEGATIVE
Specific Gravity UA: 1.01 (ref 1.001–1.035)
Urine pH: 8 (ref 5.0–8.0)
Urobilinogen, UA: NEGATIVE mg/dL

## 2018-02-07 LAB — CBC AND DIFFERENTIAL
Absolute NRBC: 0 10*3/uL (ref 0.00–0.00)
Basophils Absolute Automated: 0.04 10*3/uL (ref 0.00–0.08)
Basophils Automated: 0.5 %
Eosinophils Absolute Automated: 0.15 10*3/uL (ref 0.00–0.44)
Eosinophils Automated: 1.8 %
Hematocrit: 39.9 % (ref 34.7–43.7)
Hgb: 12 g/dL (ref 11.4–14.8)
Immature Granulocytes Absolute: 0.02 10*3/uL (ref 0.00–0.07)
Immature Granulocytes: 0.2 %
Lymphocytes Absolute Automated: 3.18 10*3/uL (ref 0.42–3.22)
Lymphocytes Automated: 37.9 %
MCH: 26.3 pg (ref 25.1–33.5)
MCHC: 30.1 g/dL — ABNORMAL LOW (ref 31.5–35.8)
MCV: 87.5 fL (ref 78.0–96.0)
MPV: 11.7 fL (ref 8.9–12.5)
Monocytes Absolute Automated: 0.61 10*3/uL (ref 0.21–0.85)
Monocytes: 7.3 %
Neutrophils Absolute: 4.38 10*3/uL (ref 1.10–6.33)
Neutrophils: 52.3 %
Nucleated RBC: 0 /100 WBC (ref 0.0–0.0)
Platelets: 302 10*3/uL (ref 142–346)
RBC: 4.56 10*6/uL (ref 3.90–5.10)
RDW: 13 % (ref 11–15)
WBC: 8.38 10*3/uL (ref 3.10–9.50)

## 2018-02-07 LAB — B-TYPE NATRIURETIC PEPTIDE: B-Natriuretic Peptide: 78 pg/mL (ref 0–100)

## 2018-02-07 LAB — GFR: EGFR: >60

## 2018-02-07 LAB — TROPONIN I: Troponin I: <0.01 ng/mL (ref 0.00–0.09)

## 2018-02-07 MED ORDER — AZITHROMYCIN 250 MG PO TABS
250.00 mg | ORAL_TABLET | Freq: Every day | ORAL | 0 refills | Status: AC
Start: 2018-02-07 — End: 2018-02-11

## 2018-02-07 MED ORDER — AZITHROMYCIN 250 MG PO TABS
500.00 mg | ORAL_TABLET | Freq: Once | ORAL | Status: AC
Start: 2018-02-07 — End: 2018-02-07
  Administered 2018-02-07: 500 mg via ORAL
  Filled 2018-02-07: qty 2

## 2018-02-07 NOTE — ED Provider Notes (Signed)
Physician/Midlevel provider first contact with patient: 02/07/18 1733         History     Chief Complaint   Patient presents with   . Generalized Body Aches     HPI     Time seen: 79 - #04    Historian: patient    Chief complaint: cough with body aches and weakness    HPI: the patient presents with the gradual onset of the above starting   several days ago    Time of onset: several days ago    Severity: moderate for all sx's    Quality: dry cough    Improved by: nothing    Worsened by: nothing    Accompanied by: no fever/chills; no SOB; no headache    Complaint experienced before: yes - remote    Past Medical History:   Diagnosis Date   . Breast cancer 2014    right breast mastectomy   . Hyperlipidemia    . Hypertension    . Malignant neoplasm of overlapping sites of right female breast 10/19/2015     Past Surgical History:   Procedure Laterality Date   . HYSTERECTOMY     . MASTECTOMY Right 2015     Family History   Problem Relation Age of Onset   . Breast cancer Neg Hx      Social  Social History   Substance Use Topics   . Smoking status: Never Smoker   . Smokeless tobacco: Never Used   . Alcohol use 0.6 oz/week     1 Cans of beer per week      Comment: socially   .   Allergies   Allergen Reactions   . Caffeine      Personal preference   . Morphine Itching   . Morphine And Related Itching and Nausea And Vomiting     dizzy     Home Medications     Med List Status:  In Progress Set By: Festus Aloe, RN at 02/07/2018  5:35 PM                acetaminophen (TYLENOL) 500 MG tablet     Take 500 mg by mouth as needed for Pain.     anastrozole (ARIMIDEX) 1 MG tablet     Take 1 tablet (1 mg total) by mouth daily     aspirin 81 MG EC tablet     Take 81 mg by mouth daily.     ibuprofen (ADVIL,MOTRIN) 600 MG tablet     Take 1 tablet (600 mg total) by mouth every 6 (six) hours as needed for Pain.for up to 30 doses     levothyroxine (SYNTHROID, LEVOTHROID) 25 MCG tablet     Take 25 mcg by mouth every morning.          lisinopril (PRINIVIL,ZESTRIL) 20 MG tablet     Take 1 tablet (20 mg total) by mouth 2 (two) times daily.     Multiple Vitamins-Minerals (MULTIVITAMIN WITH MINERALS) tablet     Take 1 tablet by mouth daily.     omeprazole (PRILOSEC) 10 MG capsule     Take 2 capsules (20 mg total) by mouth daily.     simvastatin (ZOCOR) 40 MG tablet     Take 1 tablet (40 mg total) by mouth every morning.     traMADol (ULTRAM) 50 MG tablet     Take 1 tablet (50 mg total) by mouth every 6 (six) hours as needed for Pain.Do  not drive or operate machinery while taking this medication     vitamin B-1 (THIAMINE) 100 MG tablet     Take 100 mg by mouth every morning.         Review of Systems   Constitutional: Positive for fatigue. Negative for chills, diaphoresis and fever.   HENT: Negative for congestion, ear pain, mouth sores, postnasal drip, rhinorrhea, sinus pain, sinus pressure, sore throat, trouble swallowing and voice change.    Eyes: Negative for photophobia, pain and visual disturbance.   Respiratory: Positive for cough. Negative for choking, chest tightness, shortness of breath, wheezing and stridor.    Cardiovascular: Negative for palpitations and leg swelling.   Gastrointestinal: Negative for abdominal pain, diarrhea, nausea and vomiting.   Genitourinary: Negative for dysuria, flank pain, frequency and pelvic pain.   Musculoskeletal: Positive for arthralgias and myalgias. Negative for back pain, joint swelling and neck pain.   Skin: Negative for color change, pallor and rash.   Neurological: Positive for weakness. Negative for dizziness, light-headedness and headaches.   Psychiatric/Behavioral: Negative for confusion and decreased concentration.     Physical Exam    BP: 191/89, Heart Rate: 61, Temp: 98.3 F (36.8 C), Resp Rate: 16, SpO2: 96 %, Weight: 75.6 kg    Physical Exam   Constitutional: She is oriented to person, place, and time. She appears well-developed and well-nourished. No distress.   HENT:   Right Ear: Tympanic  membrane and ear canal normal.   Left Ear: Tympanic membrane and ear canal normal.   Mouth/Throat: Oropharynx is clear and moist. No oropharyngeal exudate.   Eyes: Conjunctivae are normal.   Neck: Normal range of motion. Neck supple.   Cardiovascular: Normal rate, regular rhythm and normal heart sounds.    Pulmonary/Chest: Effort normal. No stridor. No respiratory distress. She has no wheezes. She has rhonchi. She exhibits no tenderness.   Abdominal: Soft. She exhibits no distension. There is no tenderness. There is no CVA tenderness.   Musculoskeletal: She exhibits no edema or tenderness.   Lymphadenopathy:     She has no cervical adenopathy.   Neurological: She is alert and oriented to person, place, and time. She displays normal reflexes. She exhibits normal muscle tone.   Skin: Skin is warm and dry. No rash noted. She is not diaphoretic. No erythema. No pallor.   Psychiatric: She has a normal mood and affect. Her behavior is normal. Judgment normal.   Nursing note and vitals reviewed.    MDM and ED Course     ED Medication Orders     Start Ordered     Status Ordering Provider    02/07/18 2011 02/07/18 2010  azithromycin (ZITHROMAX) tablet 500 mg  Once     Route: Oral  Ordered Dose: 500 mg     Last MAR action:  Given Daelynn Blower G         A detailed discussion was held with the patient about the results  of the examination and the studies done and about the implications of these  results. A treatment and follow-up plan was developed and agreed upon  with the patient    EKG - NSR @ 70 with PAC's and large voltage and NSSTT's - unchanged   from 05/26/2017    MDM  Number of Diagnoses or Management Options  Acute bronchitis, unspecified organism:      Amount and/or Complexity of Data Reviewed  Clinical lab tests: ordered and reviewed  Independent visualization of images, tracings, or specimens: yes  Risk of Complications, Morbidity, and/or Mortality  Presenting problems: low  Diagnostic procedures:  moderate  Management options: low    Patient Progress  Patient progress: stable           Procedures    Clinical Impression & Disposition     Clinical Impression  Final diagnoses:   Acute bronchitis, unspecified organism     ED Disposition     ED Disposition Condition Date/Time Comment    Discharge  Wed Feb 07, 2018  8:38 PM Patriciaann Clan Findling discharge to home/self care.    Condition at disposition: Stable        The patient was given these prescriptions in the ED:    azithromycin (ZITHROMAX) 250 MG tablet Take 1 tablet (250 mg total) by mouth daily for 4 doses, Starting Wed 02/07/2018, Until Sun 02/11/2018, Print                    Cosimo Schertzer, Jacelyn Pi, MD  02/12/18 [phone]

## 2018-02-07 NOTE — Discharge Instructions (Signed)
Study results to go  Activity as tolerated  Zithromax antibiotic for four more days  Acetaminophen/Tylenol - 500 mg every four hours for fever/pain  Continue current medications  Fluids - keep hydrated  Return if you are not improving or are worse

## 2018-02-10 LAB — ECG 12-LEAD
Atrial Rate: 70 {beats}/min
P Axis: 51 degrees
P-R Interval: 166 ms
Q-T Interval: 380 ms
QRS Duration: 94 ms
QTC Calculation (Bezet): 410 ms
R Axis: 52 degrees
T Axis: -11 degrees
Ventricular Rate: 70 {beats}/min

## 2018-03-15 ENCOUNTER — Encounter (INDEPENDENT_AMBULATORY_CARE_PROVIDER_SITE_OTHER): Payer: Self-pay | Admitting: Cardiology

## 2018-03-15 ENCOUNTER — Ambulatory Visit (INDEPENDENT_AMBULATORY_CARE_PROVIDER_SITE_OTHER): Payer: Medicare Other | Admitting: Cardiology

## 2018-03-15 VITALS — BP 150/82 | HR 69 | Temp 97.9°F | Resp 18 | Ht 62.0 in | Wt 163.0 lb

## 2018-03-15 DIAGNOSIS — E782 Mixed hyperlipidemia: Secondary | ICD-10-CM | POA: Insufficient documentation

## 2018-03-15 DIAGNOSIS — I10 Essential (primary) hypertension: Secondary | ICD-10-CM

## 2018-03-15 DIAGNOSIS — R002 Palpitations: Secondary | ICD-10-CM

## 2018-03-15 MED ORDER — AMLODIPINE BESYLATE 5 MG PO TABS
5.00 mg | ORAL_TABLET | Freq: Every day | ORAL | 3 refills | Status: DC
Start: 2018-03-15 — End: 2018-09-13

## 2018-03-15 MED ORDER — ROSUVASTATIN CALCIUM 20 MG PO TABS
20.00 mg | ORAL_TABLET | Freq: Every day | ORAL | 3 refills | Status: DC
Start: 2018-03-15 — End: 2018-05-09

## 2018-03-15 NOTE — Progress Notes (Signed)
[facility] PROGRESS NOTE    I had the pleasure of seeing Emily [name] today for cardiovascular follow up. She is a pleasant 72 y.o. female with a history of hypertension with a negative ekg stress test 1/19 and a previous CT chest with calcified granulomas and rt inf lung scarring.  Home blood pressures have been running in the 150 systolic range. The patient denies chest discomfort, shortness of breath, racing heart beat, syncope or near syncope.          MEDICATIONS:  Current Outpatient Prescriptions   Medication Sig Dispense Refill   . acetaminophen (TYLENOL) 500 MG tablet Take 500 mg by mouth as needed for Pain.     Marland Kitchen anastrozole (ARIMIDEX) 1 MG tablet Take 1 tablet (1 mg total) by mouth daily 90 tablet 3   . aspirin 81 MG EC tablet Take 81 mg by mouth daily.     Marland Kitchen ibuprofen (ADVIL,MOTRIN) 600 MG tablet Take 1 tablet (600 mg total) by mouth every 6 (six) hours as needed for Pain.for up to 30 doses 30 tablet 0   . levothyroxine (SYNTHROID, LEVOTHROID) 25 MCG tablet Take 25 mcg by mouth every morning.         Marland Kitchen lisinopril (PRINIVIL,ZESTRIL) 20 MG tablet Take 1 tablet (20 mg total) by mouth 2 (two) times daily. 180 tablet 3   . Multiple Vitamins-Minerals (MULTIVITAMIN WITH MINERALS) tablet Take 1 tablet by mouth daily.     Marland Kitchen omeprazole (PRILOSEC) 10 MG capsule Take 2 capsules (20 mg total) by mouth daily. 90 capsule 3   . traMADol (ULTRAM) 50 MG tablet Take 1 tablet (50 mg total) by mouth every 6 (six) hours as needed for Pain.Do not drive or operate machinery while taking this medication 10 tablet 0   . vitamin B-1 (THIAMINE) 100 MG tablet Take 100 mg by mouth every morning.     Marland Kitchen amLODIPine (NORVASC) 5 MG tablet Take 1 tablet (5 mg total) by mouth daily 30 tablet 3   . rosuvastatin (CRESTOR) 20 MG tablet Take 1 tablet (20 mg total) by mouth daily 30 tablet 3         REVIEW OF SYSTEMS: All other systems reviewed and negative except as above.    PHYSICAL EXAMINATION  General Appearance: well-appearing and in  no acute distress.   Vital Signs: BP 150/82 (BP Site: Left arm, Patient Position: Sitting, Cuff Size: Medium)   Pulse 69   Temp 97.9 F (36.6 C) (Tympanic)   Resp 18   Ht 1.575 m (5\' 2" )   Wt 73.9 kg (163 lb)   SpO2 93%   BMI 29.81 kg/m    HEENT: Sclera anicteric, conjunctiva without pallor, moist mucous membranes.  Neck: Supple without jugular venous distention.    Chest: Clear to auscultation bilaterally with good air movement and respiratory effort and no wheezes, rales, or rhonchi  Cardiovascular: Normal S1 and  S2 without murmurs, gallops or rub. PMI of normal size and nondisplaced.     Extremities: Warm without edema.      Past Medical History:   Diagnosis Date   . Breast cancer 2014    right breast mastectomy   . Hyperlipidemia    . Hypertension    . Malignant neoplasm of overlapping sites of right female breast 10/19/2015     Family History   Problem Relation Age of Onset   . Breast cancer Neg Hx      Social History     Social History   .  Marital status: Widowed     Spouse name: N/A   . Number of children: N/A   . Years of education: N/A     Social History Main Topics   . Smoking status: Never Smoker   . Smokeless tobacco: Never Used   . Alcohol use 0.6 oz/week     1 Cans of beer per week      Comment: socially   . Drug use: No   . Sexual activity: Not Asked     Other Topics Concern   . None     Social History Narrative   . None         IMPRESSION:  Hypertension with bp on the high side  Scarring and granulomas of the lung with rales on exam  Hyperlipidemia      PLAN:   Add amlodipine 5 mg daily  We will need to stop the simvastatin due to the addition of amlodipine and will switch to rosuvastatin 20 mg a day and obtain lab work just before return in 6 weeks.      Royann Shivers, MD [facility]  03/15/2018

## 2018-03-15 NOTE — Patient Instructions (Signed)
START AMLODIPINE 5 MG DAILY FOR BLOOD PRESSURE  STOP SIMVASTATIN (ZOCOR)  START CRESTOR (ROSUVASTATIN) 20 MG DAILY  FASTING BLOOD WORK AT THE HOSPITAL A FEW DAYS BEFORE I SEE YOU IN 6 WEEKS

## 2018-03-16 ENCOUNTER — Encounter (INDEPENDENT_AMBULATORY_CARE_PROVIDER_SITE_OTHER): Payer: Self-pay | Admitting: Cardiology

## 2018-04-03 ENCOUNTER — Emergency Department: Payer: Medicare Other

## 2018-04-03 ENCOUNTER — Inpatient Hospital Stay: Payer: Medicare Other

## 2018-04-03 ENCOUNTER — Observation Stay
Admission: EM | Admit: 2018-04-03 | Discharge: 2018-04-04 | Disposition: A | Discharge status: Home / Self Care | Payer: Medicare Other | Attending: Internal Medicine | Admitting: Internal Medicine

## 2018-04-03 DIAGNOSIS — C50811 Malignant neoplasm of overlapping sites of right female breast: Secondary | ICD-10-CM | POA: Insufficient documentation

## 2018-04-03 DIAGNOSIS — K219 Gastro-esophageal reflux disease without esophagitis: Secondary | ICD-10-CM | POA: Insufficient documentation

## 2018-04-03 DIAGNOSIS — R110 Nausea: Secondary | ICD-10-CM

## 2018-04-03 DIAGNOSIS — E039 Hypothyroidism, unspecified: Secondary | ICD-10-CM | POA: Diagnosis present

## 2018-04-03 DIAGNOSIS — R42 Dizziness and giddiness: Principal | ICD-10-CM | POA: Insufficient documentation

## 2018-04-03 DIAGNOSIS — E785 Hyperlipidemia, unspecified: Secondary | ICD-10-CM | POA: Insufficient documentation

## 2018-04-03 DIAGNOSIS — Z79899 Other long term (current) drug therapy: Secondary | ICD-10-CM | POA: Insufficient documentation

## 2018-04-03 DIAGNOSIS — Z7982 Long term (current) use of aspirin: Secondary | ICD-10-CM | POA: Insufficient documentation

## 2018-04-03 DIAGNOSIS — Z170 Estrogen receptor positive status [ER+]: Secondary | ICD-10-CM | POA: Insufficient documentation

## 2018-04-03 DIAGNOSIS — I10 Essential (primary) hypertension: Secondary | ICD-10-CM | POA: Insufficient documentation

## 2018-04-03 LAB — CBC AND DIFFERENTIAL
Absolute NRBC: 0 10*3/uL (ref 0.00–0.00)
Basophils Absolute Automated: 0.03 10*3/uL (ref 0.00–0.08)
Basophils Automated: 0.3 %
Eosinophils Absolute Automated: 0.02 10*3/uL (ref 0.00–0.44)
Eosinophils Automated: 0.2 %
Hematocrit: 40.7 % (ref 34.7–43.7)
Hgb: 12.2 g/dL (ref 11.4–14.8)
Immature Granulocytes Absolute: 0.05 10*3/uL (ref 0.00–0.07)
Immature Granulocytes: 0.5 %
Lymphocytes Absolute Automated: 2.06 10*3/uL (ref 0.42–3.22)
Lymphocytes Automated: 22.1 %
MCH: 26.5 pg (ref 25.1–33.5)
MCHC: 30 g/dL — ABNORMAL LOW (ref 31.5–35.8)
MCV: 88.3 fL (ref 78.0–96.0)
MPV: 10.9 fL (ref 8.9–12.5)
Monocytes Absolute Automated: 0.61 10*3/uL (ref 0.21–0.85)
Monocytes: 6.5 %
Neutrophils Absolute: 6.56 10*3/uL — ABNORMAL HIGH (ref 1.10–6.33)
Neutrophils: 70.4 %
Nucleated RBC: 0 /100 WBC (ref 0.0–0.0)
Platelets: 341 10*3/uL (ref 142–346)
RBC: 4.61 10*6/uL (ref 3.90–5.10)
RDW: 14 % (ref 11–15)
WBC: 9.33 10*3/uL (ref 3.10–9.50)

## 2018-04-03 LAB — MAGNESIUM: Magnesium: 2.4 mg/dL (ref 1.6–2.6)

## 2018-04-03 LAB — URINALYSIS, REFLEX TO MICROSCOPIC EXAM IF INDICATED
Bilirubin, UA: NEGATIVE
Blood, UA: NEGATIVE
Glucose, UA: NEGATIVE
Ketones UA: NEGATIVE
Leukocyte Esterase, UA: NEGATIVE
Nitrite, UA: NEGATIVE
Protein, UR: NEGATIVE
Specific Gravity UA: 1.004 (ref 1.001–1.035)
Urine pH: 7 (ref 5.0–8.0)
Urobilinogen, UA: NEGATIVE mg/dL

## 2018-04-03 LAB — COMPREHENSIVE METABOLIC PANEL
ALT: 19 U/L (ref 0–55)
AST (SGOT): 24 U/L (ref 5–34)
Albumin/Globulin Ratio: 1.2 (ref 0.9–2.2)
Albumin: 3.7 g/dL (ref 3.5–5.0)
Alkaline Phosphatase: 107 U/L — ABNORMAL HIGH (ref 37–106)
Anion Gap: 9 (ref 5.0–15.0)
BUN: 17 mg/dL (ref 7–19)
Bilirubin, Total: 0.4 mg/dL (ref 0.2–1.2)
CO2: 25 mEq/L (ref 22–29)
Calcium: 9.3 mg/dL (ref 7.9–10.2)
Chloride: 106 mEq/L (ref 100–111)
Creatinine: 0.9 mg/dL (ref 0.6–1.0)
Globulin: 3 g/dL (ref 2.0–3.6)
Glucose: 97 mg/dL (ref 70–100)
Potassium: 4.6 mEq/L (ref 3.5–5.1)
Protein, Total: 6.7 g/dL (ref 6.0–8.3)
Sodium: 140 mEq/L (ref 136–145)

## 2018-04-03 LAB — LIPASE: Lipase: 19 U/L (ref 8–78)

## 2018-04-03 LAB — GFR: EGFR: >60

## 2018-04-03 LAB — TROPONIN I: Troponin I: <0.01 ng/mL (ref 0.00–0.09)

## 2018-04-03 MED ORDER — ASPIRIN 325 MG PO TABS
325.00 mg | ORAL_TABLET | Freq: Every day | ORAL | Status: DC
Start: 2018-04-03 — End: 2018-04-03
  Administered 2018-04-03: 325 mg via ORAL
  Filled 2018-04-03: qty 1

## 2018-04-03 MED ORDER — LISINOPRIL 20 MG PO TABS
20.00 mg | ORAL_TABLET | Freq: Two times a day (BID) | ORAL | Status: DC
Start: 2018-04-03 — End: 2018-04-04
  Administered 2018-04-03 – 2018-04-04 (×2): 20 mg via ORAL
  Filled 2018-04-03 (×2): qty 1

## 2018-04-03 MED ORDER — DOCUSATE SODIUM 100 MG PO CAPS
100.00 mg | ORAL_CAPSULE | Freq: Two times a day (BID) | ORAL | Status: DC
Start: 2018-04-03 — End: 2018-04-04
  Filled 2018-04-03 (×2): qty 1

## 2018-04-03 MED ORDER — ANASTROZOLE 1 MG PO TABS
1.0000 mg | ORAL_TABLET | Freq: Every day | ORAL | Status: DC
  Administered 2018-04-04: 1 mg via ORAL
  Filled 2018-04-03 (×2): qty 1

## 2018-04-03 MED ORDER — MECLIZINE HCL 12.5 MG PO TABS
12.50 mg | ORAL_TABLET | Freq: Three times a day (TID) | ORAL | Status: DC | PRN
Start: 2018-04-03 — End: 2018-04-04

## 2018-04-03 MED ORDER — PANTOPRAZOLE SODIUM 40 MG PO TBEC
40.00 mg | DELAYED_RELEASE_TABLET | Freq: Every morning | ORAL | Status: DC
Start: 2018-04-04 — End: 2018-04-04
  Administered 2018-04-04: 40 mg via ORAL
  Filled 2018-04-03: qty 1

## 2018-04-03 MED ORDER — ASPIRIN 81 MG PO TBEC
81.00 mg | DELAYED_RELEASE_TABLET | Freq: Every day | ORAL | Status: DC
Start: 2018-04-04 — End: 2018-04-04
  Administered 2018-04-04: 81 mg via ORAL

## 2018-04-03 MED ORDER — METOCLOPRAMIDE HCL 5 MG/ML IJ SOLN
10.00 mg | Freq: Once | INTRAMUSCULAR | Status: AC
Start: 2018-04-03 — End: 2018-04-03
  Administered 2018-04-03: 10 mg via INTRAVENOUS
  Filled 2018-04-03: qty 2

## 2018-04-03 MED ORDER — ONDANSETRON HCL 4 MG/2ML IJ SOLN
4.00 mg | Freq: Once | INTRAMUSCULAR | Status: AC
Start: 2018-04-03 — End: 2018-04-03
  Administered 2018-04-03: 4 mg via INTRAVENOUS
  Filled 2018-04-03: qty 2

## 2018-04-03 MED ORDER — ACETAMINOPHEN 650 MG RE SUPP
650.00 mg | RECTAL | Status: DC | PRN
Start: 2018-04-03 — End: 2018-04-04

## 2018-04-03 MED ORDER — MECLIZINE HCL 12.5 MG PO TABS
25.00 mg | ORAL_TABLET | Freq: Once | ORAL | Status: AC
Start: 2018-04-03 — End: 2018-04-03
  Administered 2018-04-03: 25 mg via ORAL
  Filled 2018-04-03: qty 2

## 2018-04-03 MED ORDER — AMLODIPINE BESYLATE 5 MG PO TABS
5.00 mg | ORAL_TABLET | Freq: Every day | ORAL | Status: DC
Start: 2018-04-04 — End: 2018-04-04
  Administered 2018-04-04: 5 mg via ORAL
  Filled 2018-04-03: qty 1

## 2018-04-03 MED ORDER — ASPIRIN 81 MG PO TBEC
81.00 mg | DELAYED_RELEASE_TABLET | Freq: Every day | ORAL | Status: DC
Start: 2018-04-04 — End: 2018-04-03

## 2018-04-03 MED ORDER — SODIUM CHLORIDE 0.9 % IV BOLUS
1000.00 mL | Freq: Once | INTRAVENOUS | Status: AC
Start: 2018-04-03 — End: 2018-04-03
  Administered 2018-04-03: 1000 mL via INTRAVENOUS

## 2018-04-03 MED ORDER — GADOBUTROL 1 MMOL/ML IV SOLN
7.50 mL | Freq: Once | INTRAVENOUS | Status: AC | PRN
Start: 2018-04-03 — End: 2018-04-03
  Administered 2018-04-03: 7.5 mL via INTRAVENOUS

## 2018-04-03 MED ORDER — NALOXONE HCL 0.4 MG/ML IJ SOLN (WRAP)
0.20 mg | INTRAMUSCULAR | Status: DC | PRN
Start: 2018-04-03 — End: 2018-04-04

## 2018-04-03 MED ORDER — ROSUVASTATIN CALCIUM 10 MG PO TABS
20.00 mg | ORAL_TABLET | Freq: Every day | ORAL | Status: DC
Start: 2018-04-04 — End: 2018-04-04
  Administered 2018-04-04: 20 mg via ORAL
  Filled 2018-04-03: qty 2

## 2018-04-03 MED ORDER — DIAZEPAM 5 MG PO TABS
5.00 mg | ORAL_TABLET | Freq: Once | ORAL | Status: AC
Start: 2018-04-03 — End: 2018-04-03
  Administered 2018-04-03: 5 mg via ORAL
  Filled 2018-04-03: qty 1

## 2018-04-03 MED ORDER — LEVOTHYROXINE SODIUM 25 MCG PO TABS
25.00 ug | ORAL_TABLET | Freq: Every day | ORAL | Status: DC
Start: 2018-04-04 — End: 2018-04-04
  Administered 2018-04-04: 25 ug via ORAL
  Filled 2018-04-03: qty 1

## 2018-04-03 MED ORDER — ONDANSETRON 4 MG PO TBDP
4.00 mg | ORAL_TABLET | Freq: Four times a day (QID) | ORAL | Status: DC | PRN
Start: 2018-04-03 — End: 2018-04-04

## 2018-04-03 MED ORDER — ONDANSETRON HCL 4 MG/2ML IJ SOLN
4.00 mg | Freq: Four times a day (QID) | INTRAMUSCULAR | Status: DC | PRN
Start: 2018-04-03 — End: 2018-04-04

## 2018-04-03 MED ORDER — ACETAMINOPHEN 325 MG PO TABS
650.00 mg | ORAL_TABLET | ORAL | Status: DC | PRN
Start: 2018-04-03 — End: 2018-04-04

## 2018-04-03 NOTE — ED Provider Notes (Signed)
Physician/Midlevel provider first contact with patient: 04/03/18 1228         History     Chief Complaint   Patient presents with   . Generalized weakness     HPI   Emily [name] is a 72 y.o. female    1. Location/type of symptoms: generalized weakness   2. Onset of symptoms: today  3. Severity: moderate  4. Timing: constant  5. Quality: fatigued and lightheaded   6. Radiation of symptoms: no   7. Associated signs and Symptoms: denies headache, chest pain, abd pain, SOB. Feels nauseous. No vomiting/diarrhea.   8. Symptoms are worsened by: movements   9. Symptoms are improved by: nothing   10. History obtained from patient and EMS          Past Medical History:   Diagnosis Date   . Breast cancer 2014    right breast mastectomy   . Hyperlipidemia    . Hypertension    . Malignant neoplasm of overlapping sites of right female breast 10/19/2015       Past Surgical History:   Procedure Laterality Date   . HYSTERECTOMY     . MASTECTOMY Right 2015       Family History   Problem Relation Age of Onset   . Breast cancer Neg Hx        Social  Social History   Substance Use Topics   . Smoking status: Never Smoker   . Smokeless tobacco: Never Used   . Alcohol use 0.6 oz/week     1 Cans of beer per week      Comment: socially       .     Allergies   Allergen Reactions   . Caffeine      Personal preference   . Morphine Itching   . Morphine And Related Itching and Nausea And Vomiting     dizzy       Home Medications     Med List Status:  In Progress Set By: Mena Pauls, RN at 04/03/2018 12:51 PM                acetaminophen (TYLENOL) 500 MG tablet     Take 500 mg by mouth as needed for Pain.     amLODIPine (NORVASC) 5 MG tablet     Take 1 tablet (5 mg total) by mouth daily     anastrozole (ARIMIDEX) 1 MG tablet     Take 1 tablet (1 mg total) by mouth daily     aspirin 81 MG EC tablet     Take 81 mg by mouth daily.     ibuprofen (ADVIL,MOTRIN) 600 MG tablet     Take 1 tablet (600 mg total) by mouth every 6 (six) hours as needed  for Pain.for up to 30 doses     levothyroxine (SYNTHROID, LEVOTHROID) 25 MCG tablet     Take 25 mcg by mouth every morning.         lisinopril (PRINIVIL,ZESTRIL) 20 MG tablet     Take 1 tablet (20 mg total) by mouth 2 (two) times daily.     Multiple Vitamins-Minerals (MULTIVITAMIN WITH MINERALS) tablet     Take 1 tablet by mouth daily.     omeprazole (PRILOSEC) 10 MG capsule     Take 2 capsules (20 mg total) by mouth daily.     rosuvastatin (CRESTOR) 20 MG tablet     Take 1 tablet (20 mg total) by mouth daily  traMADol (ULTRAM) 50 MG tablet     Take 1 tablet (50 mg total) by mouth every 6 (six) hours as needed for Pain.Do not drive or operate machinery while taking this medication     vitamin B-1 (THIAMINE) 100 MG tablet     Take 100 mg by mouth every morning.           Review of Systems   Constitutional: Positive for fatigue. Negative for fever.   Respiratory: Negative for cough and shortness of breath.    Cardiovascular: Negative for chest pain.   Gastrointestinal: Positive for nausea. Negative for abdominal pain and vomiting.   Neurological: Positive for dizziness, weakness (generalized) and light-headedness. Negative for numbness and headaches.   All other systems reviewed and are negative.      Physical Exam    BP: 169/74, Heart Rate: 61, Temp: 98.3 F (36.8 C), Resp Rate: 16, SpO2: 98 %, Weight: 75.3 kg    Physical Exam    Nursing notes and vital signs reviewed    General - laying in bed with eyes closed   Head - normocephalic, atraumatic  Eyes - no scleral icterus, EOMI  Ears/Nose/Throat - moist mucous membranes; airway patent, oropharynx clear  Cardiovascular - regular rate and rhythm, 2+ distal pulses, no LE swelling, no calf tenderness  Pulmonary - normal respiratory effort, CTAB  Abdomen - soft, no TTP, non distended, + bowel sounds  Musculoskeletal - no gross injuries or deformities.   Neurological - A & O x 3, CN II - XII intact, strength and sensation equal throughout, no pronator drift  Skin -  warm, dry, no rashes      MDM and ED Course     ED Medication Orders     Start Ordered     Status Ordering Provider    04/03/18 1525 04/03/18 1524  diazePAM (VALIUM) tablet 5 mg  Once     Route: Oral  Ordered Dose: 5 mg     Last MAR action:  Given Niel Hummer, [facility]    04/03/18 1408 04/03/18 1407  metoclopramide (REGLAN) injection 10 mg  Once     Route: Intravenous  Ordered Dose: 10 mg     Last MAR action:  Given Avel Sensor    04/03/18 1407 04/03/18 1407  meclizine (ANTIVERT) tablet 25 mg  Once     Route: Oral  Ordered Dose: 25 mg     Last MAR action:  Given Avel Sensor    04/03/18 1245 04/03/18 1244  sodium chloride 0.9 % bolus 1,000 mL  Once     Route: Intravenous  Ordered Dose: 1,000 mL     Last MAR action:  Stopped Avel Sensor    04/03/18 1245 04/03/18 1244  ondansetron (ZOFRAN) injection 4 mg  Once     Route: Intravenous  Ordered Dose: 4 mg     Last MAR action:  Given Avel Sensor        CT Head without Contrast   Final Result    No acute intracranial abnormality      Kinnie Feil, MD    04/03/2018 4:14 PM        Labs Reviewed   CBC AND DIFFERENTIAL - Abnormal; Notable for the following:        Result Value    MCHC 30.0 (*)     Neutrophils Absolute 6.56 (*)     All other components within normal limits   COMPREHENSIVE METABOLIC PANEL - Abnormal; Notable for the following:  Alkaline Phosphatase 107 (*)     All other components within normal limits   LIPASE   MAGNESIUM   TROPONIN I   GFR   URINALYSIS, REFLEX TO MICROSCOPIC EXAM IF INDICATED         MDM  Adult ECG Report (reviewed by me)    Rate: 56 bpm  PR Interval: 180 ms  QRS Duration: 92 ms  QTc: 397 ms  Narrative Interpretation: NSR, no STEMI      PULSE OXIMETRY    Oxygen Saturation by Pulse Oximetry: 98%  Interpretation:  normal  Interventions: none             Signed out to Dr. Leane Platt. May need admission/MRI if symptoms persist    Procedures    Clinical Impression & Disposition     Clinical Impression  Final diagnoses:   Dizziness              Avel Sensor, MD  04/08/18 1143

## 2018-04-03 NOTE — ED Provider Notes (Signed)
The patient was s/o'ed to me at 1614 pending the results   of a CT scan ordered for a dizziness evaluation    The patient got up ~ 0400 today to use the BR and noted   vertigo with nausea and inability to walk; the symptoms have   continued; she has no headache or vision problems or speech   difficulty or focal weakness; she developed nystagmus with vertigo   when suddenly placed supine on the stretcher with her head   rotated to the right, with delay of onset and fatigue of the nystagmus    Case discussed with Dr Francesco Sor - will consult     Case discussed with Dr Langston Masker - admit    EKG - SB @ 56 with j-point elevation in V2-3 and large voltage and   NSSTT's and PVC's    The patient's symptoms are most likely related to a vestibular organ   dysfunction; the patient, in any case, was outside of the window for   consideration of t-PA by the time of her presentation     Emily Skains, MD  04/06/18 0140

## 2018-04-03 NOTE — H&P (Signed)
ADMISSION HISTORY AND PHYSICAL EXAM    [facility], DIVISION OF HOSPITALIST MEDICINE   [facility]   [place] Pager: [phone]      Date Time: 04/03/18 8:09 PM  Patient Name: Emily [name]  Attending Physician: Avel Sensor, MD  Primary Care Physician: Ramon Dredge, MD    CC: vertigo    Assessment:     Active Hospital Problems    Diagnosis   . Vertigo   . HLD (hyperlipidemia)   . Hypothyroid   . Malignant neoplasm of overlapping sites of right female breast   . Essential hypertension     Emily [name] is a 72 y.o. female with a history of hypertension, with a negative ekg stress test 1/19, HLD, a history of right breast cancer, status post mastectomy, Hypothyroidism, and GERD. She presented to ED with vertigo which started about 3 days ago, it worsen with increase movement, getting out of chair, and turning her heads from side to side. Her other associated symptoms are mild nausea and unsteady gait during the attack. Otherwise she denies recent falls, any visual deficit, fever, chest/abdominal pain, SOB, slurred speech, dysphagia, abdominal pain, numbness or weakness of the extremities.       Plan:     Vertigo  -neurology on board  -CT head: No acute intracranial abnormality  -MRI pending  -PRN meclizine  -ASA OK per neuro recommendation  -Will hold off on SQ anticoagulant If indeed new ischemia is seen further recommendations will be given accordingly,  SCD for now   -fall precaution     HTN  -continue with home bp meds  -  a negative ekg stress test 1/19 according to cardiologist Dr.Rosendblatt's note on 03/15/18    HLD  -Lipid panel  -continue with Statin    Hypothyroid   -continue with synthroid  -check TSH, Free T3 and T4     Malignant neoplasm of overlapping sites of right female breast  -continue with anastrozole per her routine oncologist Dr. Rozanna Box   - one of the possible side effect of anastrozole can be dizziness???     GERD  - continue with PPI     Diet: patient  passed swallowing test, cardiac diet  Code: Full  DVT: SCD, ASA     -Admit to [facility] Hospitalist service      Disposition:     Anticipated medical stability for discharge: TBD   Service status: Inpatient: new onset of end-organ disease that would require in hospital work up    History of Presenting Illness:   Emily [name] is a 72 y.o. female with a history of hypertension, with a negative ekg stress test 1/19, HLD, a history of right breast cancer (T1N0M0, status post right total mastectomy and SLNB), Hypothyroidism, and GERD. She presented to ED with vertigo which started about 3 days ago, it worsen with increase movement, getting out of chair, and turning her heads from side to side. Her other associated symptoms are mild nausea and unsteady gait during the attack. Otherwise she denies recent falls, any visual deficit, fever, chest/abdominal pain, SOB, slurred speech, dysphagia, abdominal pain, numbness or weakness of the extremities.     Her routine cardiologist is Dr.Rosendblatt. Home blood pressures have been running in the 150 systolic range. She was instructed to stop the simvastatin due to the addition of amlodipine 5 mg daily, and was switched to rosuvastatin 20 mg a day and to return to Dr. Franchot Erichsen in 6 weeks.  Per records in 2014, the patient underwent routine mammographic screening on 08/14/13, with subsequent stereotactic guided core biopsy on 09/02/2013.   Pathology report revealed an infiltrating ductal carcinoma 1.2 cm, grade 2 with associated DCIS.  The patient underwent right total mastectomy and sentinel node biopsy.  Invasive tumor profile was ER positive, PR positive, HER-2/neu negative, Ki-67 20%. Sentinel node was negative. The patient's postoperative course was complicated by an infection at the expander site. The expander ultimately had to be removed. She has undergone a total of 2 surgeries, and has been unable to have reconstructive surgery since.Her routine oncologist at this time is  Dr. Mickel Crow. Tamala Bari. The patient was previously followed at [facility], but transferred care to be closer to her home in [place].     Her last visit, she underwent ultrasound breast bilateral on November 07, 2017 in the setting of pain at the right mastectomy site and left breast. No evidence of mass in the mastectomy bed. Benign lymph node in the right axilla. Circumscribed mass in the high left axilla which is been present on mammograms when compared to 2014. She was recommended 4 to 6 month follow-up to ensure stability.     Left diagnostic mammogram 11/07/2017: No mammographic evidence of malignancy in the left breast or significant change from the prior exam.  The patient is currently on 5 year course of endocrine therapy, an aromatase inhibitor (anastrozole), which she is tolerating fairly well, started early 2015, to be completed in 2020, with anastrozole 1 mg by mouth daily, which she is tolerating well.      Past Medical History:     Past Medical History:   Diagnosis Date   . Breast cancer 2014    right breast mastectomy   . Hyperlipidemia    . Hypertension    . Malignant neoplasm of overlapping sites of right female breast 10/19/2015     Available old records reviewed, including: EPIC, patient     Past Surgical History:     Past Surgical History:   Procedure Laterality Date   . HYSTERECTOMY     . MASTECTOMY Right 2015       Family History:     Family History   Problem Relation Age of Onset   . Breast cancer Neg Hx      Otherwise reviewed and non contributory   Social History:     History   Smoking Status   . Never Smoker   Smokeless Tobacco   . Never Used     History   Alcohol Use   . 0.6 oz/week   . 1 Cans of beer per week     Comment: socially     History   Drug Use No       Allergies:     Allergies   Allergen Reactions   . Caffeine      Personal preference   . Morphine Itching   . Morphine And Related Itching and Nausea And Vomiting     dizzy       Medications:     Home Medications     Med List  Status:  In Progress Set By: Mena Pauls, RN at 04/03/2018 12:51 PM                acetaminophen (TYLENOL) 500 MG tablet     Take 500 mg by mouth as needed for Pain.     amLODIPine (NORVASC) 5 MG tablet     Take 1 tablet (5 mg  total) by mouth daily     anastrozole (ARIMIDEX) 1 MG tablet     Take 1 tablet (1 mg total) by mouth daily     aspirin 81 MG EC tablet     Take 81 mg by mouth daily.     ibuprofen (ADVIL,MOTRIN) 600 MG tablet     Take 1 tablet (600 mg total) by mouth every 6 (six) hours as needed for Pain.for up to 30 doses     levothyroxine (SYNTHROID, LEVOTHROID) 25 MCG tablet     Take 25 mcg by mouth every morning.         lisinopril (PRINIVIL,ZESTRIL) 20 MG tablet     Take 1 tablet (20 mg total) by mouth 2 (two) times daily.     Multiple Vitamins-Minerals (MULTIVITAMIN WITH MINERALS) tablet     Take 1 tablet by mouth daily.     omeprazole (PRILOSEC) 10 MG capsule     Take 2 capsules (20 mg total) by mouth daily.     rosuvastatin (CRESTOR) 20 MG tablet     Take 1 tablet (20 mg total) by mouth daily     traMADol (ULTRAM) 50 MG tablet     Take 1 tablet (50 mg total) by mouth every 6 (six) hours as needed for Pain.Do not drive or operate machinery while taking this medication     vitamin B-1 (THIAMINE) 100 MG tablet     Take 100 mg by mouth every morning.          Method by which medications were confirmed on admission:       Review of Systems:   All other systems were reviewed and are negative except:as above in HPI     Physical Exam:   Patient Vitals for the past 24 hrs:   BP Temp Temp src Pulse Resp SpO2 Height Weight   04/03/18 1901 186/75 - - (!) 55 17 100 % - -   04/03/18 1750 176/76 97.6 F (36.4 C) Oral (!) 54 19 100 % - -   04/03/18 1457 175/80 97.9 F (36.6 C) Oral 67 18 100 % - -   04/03/18 1236 169/74 98.3 F (36.8 C) - 61 16 98 % 1.6 m (5\' 3" ) 75.3 kg (166 lb 0.1 oz)     Body mass index is 29.41 kg/m.    Intake/Output Summary (Last 24 hours) at 04/03/18 2009  Last data filed at 04/03/18  1453   Gross per 24 hour   Intake             1000 ml   Output                0 ml   Net             1000 ml           Constitutional:  Well developed, well nourished, no acute distress, non-toxic appearance   Eyes:  PERRL, conjunctiva normal   HENT:  Atraumatic, external ears normal, nose normal, oropharynx moist, no pharyngeal exudates. Neck- normal range of motion, no tenderness, supple   Respiratory:  No respiratory distress, normal breath sounds, no rales, no wheezing   Cardiovascular:  Normal rate, normal rhythm, no murmurs, no gallops, no rubs   GI:  Soft, nondistended, normal bowel sounds, nontender, no organomegaly, no mass, no rebound, no guarding   GU:  No costovertebral angle tenderness   Musculoskeletal:  No edema, no tenderness, no deformities.   Integument:  Well hydrated, no rash  Neurologic:  Alert & oriented x 3, normal motor function, normal sensory function, no focal deficits noted   Psychiatric:  Speech and behavior appropriate     Labs:     Results     Procedure Component Value Units Date/Time    UA with reflex to micro (pts  3 + yrs) [[id number]] Collected:  04/03/18 1503    Specimen:  Urine Updated:  04/03/18 1514     Urine Type Clean Catch     Color, UA Colorless     Clarity, UA Clear     Specific Gravity UA 1.004     Urine pH 7.0     Leukocyte Esterase, UA Negative     Nitrite, UA Negative     Protein, UR Negative     Glucose, UA Negative     Ketones UA Negative     Urobilinogen, UA Negative mg/dL      Bilirubin, UA Negative     Blood, UA Negative    Troponin I [[id number]] Collected:  04/03/18 1352    Specimen:  Blood Updated:  04/03/18 1432     Troponin I <0.01 ng/mL     Comprehensive metabolic panel [[id number]]  (Abnormal) Collected:  04/03/18 1352    Specimen:  Blood Updated:  04/03/18 1426     Glucose 97 mg/dL      BUN 17 mg/dL      Creatinine 0.9 mg/dL      Sodium 706 mEq/L      Potassium 4.6 mEq/L      Chloride 106 mEq/L      CO2 25 mEq/L      Calcium 9.3 mg/dL      Protein, Total 6.7  g/dL      Albumin 3.7 g/dL      AST (SGOT) 24 U/L      ALT 19 U/L      Alkaline Phosphatase 107 (H) U/L      Bilirubin, Total 0.4 mg/dL      Globulin 3.0 g/dL      Albumin/Globulin Ratio 1.2     Anion Gap 9.0    Lipase [[id number]] Collected:  04/03/18 1352    Specimen:  Blood Updated:  04/03/18 1426     Lipase 19 U/L     Magnesium [[id number]] Collected:  04/03/18 1352    Specimen:  Blood Updated:  04/03/18 1426     Magnesium 2.4 mg/dL     GFR [[id number]] Collected:  04/03/18 1352     Updated:  04/03/18 1426     EGFR >60.0    CBC with differential [[id number]]  (Abnormal) Collected:  04/03/18 1352    Specimen:  Blood from Blood Updated:  04/03/18 1404     WBC 9.33 x10 3/uL      Hgb 12.2 g/dL      Hematocrit 93.8 %      Platelets 341 x10 3/uL      RBC 4.61 x10 6/uL      MCV 88.3 fL      MCH 26.5 pg      MCHC 30.0 (L) g/dL      RDW 14 %      MPV 10.9 fL      Neutrophils 70.4 %      Lymphocytes Automated 22.1 %      Monocytes 6.5 %      Eosinophils Automated 0.2 %      Basophils Automated 0.3 %      Immature Granulocyte 0.5 %  Nucleated RBC 0.0 /100 WBC      Neutrophils Absolute 6.56 (H) x10 3/uL      Abs Lymph Automated 2.06 x10 3/uL      Abs Mono Automated 0.61 x10 3/uL      Abs Eos Automated 0.02 x10 3/uL      Absolute Baso Automated 0.03 x10 3/uL      Absolute Immature Granulocyte 0.05 x10 3/uL      Absolute NRBC 0.00 x10 3/uL         EKG SINUS BRADYCARDIA OCCASIONAL PREMATURE VENTRICULAR COMPLEXES  OTHERWISE NORMAL ECG    Imaging personally reviewed, including: all available   Ct Head Without Contrast    Result Date: 04/03/2018   No acute intracranial abnormality Kinnie Feil, MD 04/03/2018 4:14 PM      Safety Checklist  DVT prophylaxis:  CHEST guideline (See page [id number]) Mechanical     This note was generated by the Epic EMR system/ Dragon speech recognition and may contain inherent errors or omissions not intended by the user. Grammatical errors, random word insertions, deletions and pronoun errors  are  occasional consequences of this technology due to software limitations. Not all errors are caught or corrected. If there are questions or concerns about the content of this note or information contained within the body of this dictation they should be addressed directly with the author for clarification.    Signed by: Verita Schneiders, NP   ZO:[id number], Janet Berlin, MD

## 2018-04-03 NOTE — ED Notes (Signed)
Assumed c/o pt. [place] provided for PIV line insertion by EMT tech. Meds to be administered after line established,.

## 2018-04-03 NOTE — ED Triage Notes (Signed)
Brought in by medic from home. Patient woke up around 1030 with generalized weakness, nuasea and dizziness. Denies pain.

## 2018-04-03 NOTE — EDIE (Signed)
COLLECTIVE?NOTIFICATION?04/03/2018 12:22?Najma, Bozarth B?MRN: [id number]    [facility]'s patient encounter information:   QIO:?[id number]  Account [id number]  Billing Account [id number]      Criteria Met      5 ED Visits in 12 Months    Security and Safety  No recent Security Events currently on file    ED Care Guidelines  There are currently no ED Care Guidelines for this patient. Please check your facility's medical records system.      Prescription Monitoring Program  PDMP query found no report.  Narx Score not available at this time.      E.D. Visit Count (12 mo.)  Facility Visits   [facility] 5   Total 5   Note: Visits indicate total known visits.      Recent Emergency Department Visit Summary  Date Facility [facility] Type Diagnoses or Chief Complaint   Apr 03, 2018 [facility] [place] [facility]      medic      Feb 07, 2018 [facility] [place] [facility]      Triage-Weakness      Generalized Body Aches      Acute bronchitis, unspecified      Sep 13, 2017 [place] [facility] [place] [facility]      body aches      Nausea      Generalized Body Aches      Dizziness      Chest pain, unspecified      Essential (primary) hypertension      Myalgia, unspecified site      Paresthesia of skin      Jul 10, 2017 [facility] [place] [facility]      Dizziness      Numbness      Chest Pain      Other chest pain      Other chronic pain      May 26, 2017 [place] [facility] [place] [facility]      triage- left sided numbness, extremity weakness      Tingling          Recent Inpatient Visit Summary  No recorded inpatient visits.     Care Team  There are no care providers on record at this time.   Collective Portal  This patient has registered at the [facility] Emergency Department   For more information visit: [email or web address]     PLEASE NOTE:    1.    Any care recommendations and other clinical information are provided as guidelines or for historical purposes only, and providers should exercise their own clinical judgment when providing care.    2.   You may only use this information for purposes of treatment, payment or health care operations activities, and subject to the limitations of applicable Collective Policies.    3.   You should consult directly with the organization that provided a care guideline or other clinical history with any questions about additional information or accuracy or completeness of information provided.    ? 2019 [place], [employer] - [email or web address]

## 2018-04-03 NOTE — Consults (Signed)
NEUROLOGY CONSULTATION    Date Time: 04/03/18 6:07 PM  Patient Name: Emily [name]  Attending Physician: Avel Sensor, MD      Assessment & Plan:   Sudden onset of dizziness spinning sensation gait imbalance nausea, worse with head change position changes, seems more likely to be vestibular dysfunction, but given vascular risk factors and age, need to rule out posterior circulation event, likely nevus on the lower side   MRI of the brain   PRN meclizine   Aspirin on empiric basis   If indeed new ischemia is seen further recommendations will be given accordingly    History of Present Illness:   Patient is a 72 year old lady, comes into the hospital with new onset of dizziness, spinning sensation, and also gait imbalance and nausea, this started this morning.  She reports her symptoms have been ongoing and consistent, with every time she moves her head or tries to get out of bed, she feels her symptoms.  She has undergone a CT scan of the head which is negative, she reports no numbness stinging paresthesias she reports no diplopia reports no trauma no headache or neck pain    Past Medical History:     Past Medical History:   Diagnosis Date   . Breast cancer 2014    right breast mastectomy   . Hyperlipidemia    . Hypertension    . Malignant neoplasm of overlapping sites of right female breast 10/19/2015       Meds:   Tylenol Norvasc Arimidex aspirin ibuprofen Synthroid Zestril vitamins Prilosec Crestor Ultram thiamine      Allergies   Allergen Reactions   . Caffeine      Personal preference   . Morphine Itching   . Morphine And Related Itching and Nausea And Vomiting     dizzy       Social & Family History:     Social History     Social History   . Marital status: Widowed     Spouse name: N/A   . Number of children: N/A   . Years of education: N/A     Occupational History   . Not on file.     Social History Main Topics   . Smoking status: Never Smoker   . Smokeless tobacco: Never Used   . Alcohol use 0.6 oz/week      1 Cans of beer per week      Comment: socially   . Drug use: No   . Sexual activity: Not on file     Other Topics Concern   . Not on file     Social History Narrative   . No narrative on file     Family History   Problem Relation Age of Onset   . Breast cancer Neg Hx            CODE STATUS: *Full code  Review of Systems:   No headache, eye, ear nose, throat problems; no coughing or wheezing or shortness of breath, No chest pain or orthopnea, no abdominal pain, mild nausea no vomiting, No pain in the body or extremities, no psychiatric, neurological, endocrine, hematological or cardiac complaints except as noted above.             Physical Exam:   Blood pressure 176/76, pulse (!) 54, temperature 97.6 F (36.4 C), temperature source Oral, resp. rate 19, height 1.6 m (5\' 3" ), weight 75.3 kg (166 lb 0.1 oz), SpO2 100 %.    HEENT:  Normocephalic.no carotid bruits  Lungs:  CTA bil  Abd Soft   Cardiac:  S1,S2, normal rate and rhythm  Neck: supple, no cartoid bruits  Extremities: no edema  Skin: no rashes seen in exposed areas     Neuro:  Level of consciousness:  Alert and appropriate  Oriented:  X 3  Cognition:  Intact naming, recognition, concentration and following complex commands  Cranial Nerves:  II-XII intact pupils are equal reactive eye movements are normal no obvious nystagmus appreciated  Strength:  No upper extremity drift, 5/5 strength x 4 extremities  Coordination:  Intact FTN testing  Reflexes:  +2 throughout, down going toes bil  Sensation: Intact x 4 extremities to LT, temp  Labs:     Recent Labs  Lab 04/03/18  1352   Glucose 97   BUN 17   Creatinine 0.9   Calcium 9.3   Sodium 140   Potassium 4.6   Chloride 106   CO2 25   Albumin 3.7   Magnesium 2.4   AST (SGOT) 24   ALT 19   Bilirubin, Total 0.4   Alkaline Phosphatase 107*       Recent Labs  Lab 04/03/18  1352   WBC 9.33   Hgb 12.2   Hematocrit 40.7   MCV 88.3   MCH 26.5   MCHC 30.0*   Platelets 341         No results for input(s): PTT, PT, INR in the  last 72 hours.       Radiology Results (24 Hour)     Procedure Component Value Units Date/Time    CT Head without Contrast [[id number]] Collected:  04/03/18 1612    Order Status:  Completed Updated:  04/03/18 1618    Narrative:         INDICATION: Vertigo    COMPARISON: MRI brain 05/26/2017    TECHNIQUE: Computed tomography of the head was performed at 5 mm slice  thickness from the skull base to the apex. A combination of automatic  exposure control and adjustment of the MA and/or KV was utilized  according to patient size.    FINDINGS:  The ventricular system is normal in size, shape and contour.  Chronic small vessel ischemic changes in the periventricular white  matter.  There is no acute edema, midline shift, hemorrhage, or extra-axial fluid  collection.  The gray white matter junctions are preserved.  The visualized portions of the paranasal sinuses are within normal  limits.  The visualized osseous structures are unremarkable.      Impression:        No acute intracranial abnormality    Kinnie Feil, MD   04/03/2018 4:14 PM           All recent brain and spine imaging (MRI, CT) personally reviewed.    Chart reviewed    Case discussed with:        This note was generated by the Epic EMR system/Speech recognition and may contain inherent errors or omissions not intended by the user. Grammatical errors, random word insertions, deletions and pronoun errors  are occasional consequences of this technology due to software limitations.   Not all errors are caught or corrected. If there are questions or concerns about the content of this note or information contained within the body of this dictation they should be addressed directly with the author for clarification.    Signed by: Cathe Mons, MD  Spectralink: [id number]      Answering Service:  [phone]

## 2018-04-04 DIAGNOSIS — I10 Essential (primary) hypertension: Secondary | ICD-10-CM

## 2018-04-04 DIAGNOSIS — E785 Hyperlipidemia, unspecified: Secondary | ICD-10-CM

## 2018-04-04 DIAGNOSIS — E039 Hypothyroidism, unspecified: Secondary | ICD-10-CM

## 2018-04-04 LAB — CBC AND DIFFERENTIAL
Absolute NRBC: 0 10*3/uL (ref 0.00–0.00)
Basophils Absolute Automated: 0.03 10*3/uL (ref 0.00–0.08)
Basophils Automated: 0.3 %
Eosinophils Absolute Automated: 0.02 10*3/uL (ref 0.00–0.44)
Eosinophils Automated: 0.2 %
Hematocrit: 43.8 % — ABNORMAL HIGH (ref 34.7–43.7)
Hgb: 13 g/dL (ref 11.4–14.8)
Immature Granulocytes Absolute: 0.06 10*3/uL (ref 0.00–0.07)
Immature Granulocytes: 0.6 %
Lymphocytes Absolute Automated: 2.77 10*3/uL (ref 0.42–3.22)
Lymphocytes Automated: 28.6 %
MCH: 26.5 pg (ref 25.1–33.5)
MCHC: 29.7 g/dL — ABNORMAL LOW (ref 31.5–35.8)
MCV: 89.2 fL (ref 78.0–96.0)
MPV: 11.2 fL (ref 8.9–12.5)
Monocytes Absolute Automated: 0.73 10*3/uL (ref 0.21–0.85)
Monocytes: 7.5 %
Neutrophils Absolute: 6.06 10*3/uL (ref 1.10–6.33)
Neutrophils: 62.8 %
Nucleated RBC: 0 /100 WBC (ref 0.0–0.0)
Platelets: 345 10*3/uL (ref 142–346)
RBC: 4.91 10*6/uL (ref 3.90–5.10)
RDW: 14 % (ref 11–15)
WBC: 9.67 10*3/uL — ABNORMAL HIGH (ref 3.10–9.50)

## 2018-04-04 LAB — BASIC METABOLIC PANEL
Anion Gap: 9 (ref 5.0–15.0)
BUN: 17 mg/dL (ref 7–19)
CO2: 27 mEq/L (ref 22–29)
Calcium: 9.3 mg/dL (ref 7.9–10.2)
Chloride: 107 mEq/L (ref 100–111)
Creatinine: 0.9 mg/dL (ref 0.6–1.0)
Glucose: 94 mg/dL (ref 70–100)
Potassium: 4.6 mEq/L (ref 3.5–5.1)
Sodium: 143 mEq/L (ref 136–145)

## 2018-04-04 LAB — T3, FREE: T3, Free: 2.08 pg/mL (ref 1.71–3.71)

## 2018-04-04 LAB — LIPID PANEL
Cholesterol / HDL Ratio: 3.4
Cholesterol: 224 mg/dL — ABNORMAL HIGH (ref 0–199)
HDL: 66 mg/dL (ref 40–9999)
LDL Calculated: 143 mg/dL — ABNORMAL HIGH (ref 0–99)
Triglycerides: 77 mg/dL (ref 34–149)
VLDL Calculated: 15 mg/dL (ref 10–40)

## 2018-04-04 LAB — GFR: EGFR: >60

## 2018-04-04 LAB — HEMOLYSIS INDEX: Hemolysis Index: 26 — ABNORMAL HIGH (ref 0–18)

## 2018-04-04 LAB — T4, FREE: T4 Free: 0.84 ng/dL (ref 0.70–1.48)

## 2018-04-04 LAB — TSH: TSH: 1.29 u[IU]/mL (ref 0.35–4.94)

## 2018-04-04 MED ORDER — MECLIZINE HCL 25 MG PO TABS
25.0000 mg | ORAL_TABLET | Freq: Three times a day (TID) | ORAL | 0 refills | Status: DC | PRN
Start: 2018-04-04 — End: 2018-05-15

## 2018-04-04 MED ORDER — ENOXAPARIN SODIUM 40 MG/0.4ML SC SOLN
40.00 mg | SUBCUTANEOUS | Status: DC
Start: 2018-04-04 — End: 2018-04-04
  Administered 2018-04-04: 40 mg via SUBCUTANEOUS

## 2018-04-04 NOTE — Discharge Summary (Signed)
DISCHARGE SUMMARY/PROGRESS NOTE  [facility], DIVISION OF HOSPITALIST MEDICINE   [facility]     Date Time: 04/04/18 2:09 PM  Patient Name: Emily [name]  Attending Physician: Arna Medici, MD  Primary Care Physician: Ramon Dredge, MD    Date of Admission: 04/03/2018  Date of Discharge: 04/04/2018    Outcome of Hospitalization:   Principal Problem:    Vertigo  Active Problems:    Essential hypertension    Malignant neoplasm of overlapping sites of right female breast    HLD (hyperlipidemia)    Hypothyroid  Resolved Problems:    * No resolved hospital problems. *     Significant Events: 72 year old female with known history of hypertension, previous breast cancer status post mastectomy, hypothyroidism and reflux disease presented with sudden onset of vertigo over the last 3 days.  It is positional and during her stay in the hospital she was seen by neurology.  She underwent MRI which did not show any concerning findings.  Her symptoms are concerning for vestibular etiology of vertigo.  She was provided with information about vestibular exercises, responding to PRN meclizine and refer to ENT if she does not improve with supportive care.    Otherwise her hypertension, hypothyroidism and dyslipidemia is under fair control and she will continue with routine outpatient follow-up.    Today:   BP 131/62   Pulse 66   Temp 97.8 F (36.6 C) (Oral)   Resp 18   Ht 1.6 m (5\' 3" )   Wt 74.1 kg (163 lb 4.8 oz)   SpO2 100%   BMI 28.93 kg/m   Ranges for the last 24 hours:  Temp:  [96.5 F (35.8 C)-98 F (36.7 C)] 97.8 F (36.6 C)  Heart Rate:  [54-67] 66  Resp Rate:  [17-19] 18  BP: (131-186)/(62-80) 131/62  General appearance: alert, appears stated age and cooperative  Lungs: clear to auscultation bilaterally  Chest wall: no tenderness  Heart: regular rate and rhythm, S1, S2 normal, no murmur, click, rub or gallop  Abdomen: soft, non-tender; bowel sounds normal; no masses,  no  organomegaly  Neurologic: Grossly normal     Procedures performed:   Radiology: all results from this admission  Ct Head Without Contrast    Result Date: 04/03/2018   No acute intracranial abnormality Kinnie Feil, MD 04/03/2018 4:14 PM    Mri Brain W Wo Contrast    Result Date: 04/03/2018  1. No acute intracranial abnormality. 2. Foci of hyperintense FLAIR signal again noted without interval change. This is nonspecific but may be related to chronic ischemic changes from small vessel disease. Wyatt Portela, MD 04/03/2018 9:22 PM    Surgery: all results from this admission  * No surgery found *  Lab Results last 48 Hours     Procedure Component Value Units Date/Time    T4, free [[id number]] Collected:  04/04/18 0659    Specimen:  Blood Updated:  04/04/18 1019     T4 Free 0.84 ng/dL     T3, free [[id number]] Collected:  04/04/18 0659    Specimen:  Blood Updated:  04/04/18 1019     T3, Free 2.08 pg/mL     Lipid panel [[id number]]  (Abnormal) Collected:  04/04/18 0659    Specimen:  Blood Updated:  04/04/18 0958     Cholesterol 224 (H) mg/dL      Triglycerides 77 mg/dL      HDL 66 mg/dL      LDL Calculated  143 (H) mg/dL      VLDL Cholesterol Cal 15 mg/dL      CHOL/HDL Ratio 3.4    Hemolysis index [[id number]]  (Abnormal) Collected:  04/04/18 0659     Updated:  04/04/18 0958     Hemolysis Index 26 (H)    TSH [[id number]] Collected:  04/04/18 0659    Specimen:  Blood Updated:  04/04/18 0756     TSH 1.29 uIU/mL     GFR [[id number]] Collected:  04/04/18 0659     Updated:  04/04/18 0734     EGFR >60.0    Basic Metabolic Panel [[id number]] Collected:  04/04/18 0659    Specimen:  Blood Updated:  04/04/18 0734     Glucose 94 mg/dL      BUN 17 mg/dL      Creatinine 0.9 mg/dL      Calcium 9.3 mg/dL      Sodium 629 mEq/L      Potassium 4.6 mEq/L      Chloride 107 mEq/L      CO2 27 mEq/L      Anion Gap 9.0    CBC and differential [[id number]]  (Abnormal) Collected:  04/04/18 0659    Specimen:  Blood from Blood Updated:  04/04/18 0710     WBC 9.67  (H) x10 3/uL      Hgb 13.0 g/dL      Hematocrit 01.0 (H) %      Platelets 345 x10 3/uL      RBC 4.91 x10 6/uL      MCV 89.2 fL      MCH 26.5 pg      MCHC 29.7 (L) g/dL      RDW 14 %      MPV 11.2 fL      Neutrophils 62.8 %      Lymphocytes Automated 28.6 %      Monocytes 7.5 %      Eosinophils Automated 0.2 %      Basophils Automated 0.3 %      Immature Granulocyte 0.6 %      Nucleated RBC 0.0 /100 WBC      Neutrophils Absolute 6.06 x10 3/uL      Abs Lymph Automated 2.77 x10 3/uL      Abs Mono Automated 0.73 x10 3/uL      Abs Eos Automated 0.02 x10 3/uL      Absolute Baso Automated 0.03 x10 3/uL      Absolute Immature Granulocyte 0.06 x10 3/uL      Absolute NRBC 0.00 x10 3/uL     UA with reflex to micro (pts  3 + yrs) [[id number]] Collected:  04/03/18 1503    Specimen:  Urine Updated:  04/03/18 1514     Urine Type Clean Catch     Color, UA Colorless     Clarity, UA Clear     Specific Gravity UA 1.004     Urine pH 7.0     Leukocyte Esterase, UA Negative     Nitrite, UA Negative     Protein, UR Negative     Glucose, UA Negative     Ketones UA Negative     Urobilinogen, UA Negative mg/dL      Bilirubin, UA Negative     Blood, UA Negative    Troponin I [[id number]] Collected:  04/03/18 1352    Specimen:  Blood Updated:  04/03/18 1432     Troponin I <0.01 ng/mL     Comprehensive metabolic panel [[id number]]  (  Abnormal) Collected:  04/03/18 1352    Specimen:  Blood Updated:  04/03/18 1426     Glucose 97 mg/dL      BUN 17 mg/dL      Creatinine 0.9 mg/dL      Sodium 161 mEq/L      Potassium 4.6 mEq/L      Chloride 106 mEq/L      CO2 25 mEq/L      Calcium 9.3 mg/dL      Protein, Total 6.7 g/dL      Albumin 3.7 g/dL      AST (SGOT) 24 U/L      ALT 19 U/L      Alkaline Phosphatase 107 (H) U/L      Bilirubin, Total 0.4 mg/dL      Globulin 3.0 g/dL      Albumin/Globulin Ratio 1.2     Anion Gap 9.0    Lipase [[id number]] Collected:  04/03/18 1352    Specimen:  Blood Updated:  04/03/18 1426     Lipase 19 U/L     Magnesium [[id number]]  Collected:  04/03/18 1352    Specimen:  Blood Updated:  04/03/18 1426     Magnesium 2.4 mg/dL     GFR [[id number]] Collected:  04/03/18 1352     Updated:  04/03/18 1426     EGFR >60.0    CBC with differential [[id number]]  (Abnormal) Collected:  04/03/18 1352    Specimen:  Blood from Blood Updated:  04/03/18 1404     WBC 9.33 x10 3/uL      Hgb 12.2 g/dL      Hematocrit 52.8 %      Platelets 341 x10 3/uL      RBC 4.61 x10 6/uL      MCV 88.3 fL      MCH 26.5 pg      MCHC 30.0 (L) g/dL      RDW 14 %      MPV 10.9 fL      Neutrophils 70.4 %      Lymphocytes Automated 22.1 %      Monocytes 6.5 %      Eosinophils Automated 0.2 %      Basophils Automated 0.3 %      Immature Granulocyte 0.5 %      Nucleated RBC 0.0 /100 WBC      Neutrophils Absolute 6.56 (H) x10 3/uL      Abs Lymph Automated 2.06 x10 3/uL      Abs Mono Automated 0.61 x10 3/uL      Abs Eos Automated 0.02 x10 3/uL      Absolute Baso Automated 0.03 x10 3/uL      Absolute Immature Granulocyte 0.05 x10 3/uL      Absolute NRBC 0.00 x10 3/uL           Treatment Team:   Attending Provider: Arna Medici, MD  Consulting Physician: Rhae Hammock, MD    Disposition:   Disposition: Home or Self Care    Condition at Discharge:   good     Follow up Recommendations for Receiving Provider   Diet- Diet cardiac    Unresulted Labs     None          Discharge Instructions:     Follow-up Information     Ramon Dredge, MD. Schedule an appointment as soon as possible for a visit in 1 week(s).    Specialty:  Family Medicine  Contact information:  [phone] [facility]  [address]  [place] [address]  [phone]                    Medication List      START taking these medications    meclizine 25 MG tablet  Commonly known as:  ANTIVERT  Take 1 tablet (25 mg total) by mouth 3 (three) times daily as needed for Dizziness        CONTINUE taking these medications    acetaminophen 500 MG tablet  Commonly known as:  TYLENOL     amLODIPine 5 MG tablet  Commonly known as:  NORVASC  Take 1  tablet (5 mg total) by mouth daily     anastrozole 1 MG tablet  Commonly known as:  ARIMIDEX  Take 1 tablet (1 mg total) by mouth daily     aspirin 81 MG EC tablet     ibuprofen 600 MG tablet  Commonly known as:  ADVIL,MOTRIN  Take 1 tablet (600 mg total) by mouth every 6 (six) hours as needed for Pain.for up to 30 doses     levothyroxine 25 MCG tablet  Commonly known as:  SYNTHROID, LEVOTHROID     lisinopril 20 MG tablet  Commonly known as:  PRINIVIL,ZESTRIL  Take 1 tablet (20 mg total) by mouth 2 (two) times daily.     multivitamin with minerals tablet     omeprazole 10 MG capsule  Commonly known as:  PriLOSEC  Take 2 capsules (20 mg total) by mouth daily.     rosuvastatin 20 MG tablet  Commonly known as:  CRESTOR  Take 1 tablet (20 mg total) by mouth daily     vitamin B-1 100 MG tablet  Commonly known as:  THIAMINE        STOP taking these medications    simvastatin 40 MG tablet  Commonly known as:  ZOCOR           Where to Get Your Medications      These medications were sent to [facility] [address], [place] - [address]  [address] [place] [address]    Phone:  [phone]    meclizine 25 MG tablet       Signed by: Arna Medici, MD

## 2018-04-04 NOTE — Plan of Care (Signed)
Problem: Safety  Goal: Patient will be free from injury during hospitalization  Outcome: Progressing

## 2018-04-04 NOTE — Progress Notes (Signed)
The learning abilities of the patient and/or caregiver have been assessed. Today's individualized plan of care includes patient is alert and oriented x 4.still complaining of dizziness. However,CT-scan and MRI are both negative. NIH stroke screen =0, GC=15  . The patient or caregiver states the following personal goal related to the patient's deficit(s): "By discharge I want to be able to return to her home and her life.." The plan of care was discussed with the patient and/or caregiver, who agrees to it and demonstrates understanding of the disease process, risk factors, treatment plan, medications and consequences of noncompliance. All questions and concerns were addressed. Will continue to monitor for changes.

## 2018-04-04 NOTE — Progress Notes (Signed)
Patient is being admitted with complaint of dizziness since 04/03/18 at 0400. Both CT scan and MRI are negative. Meclizine on hand for dizziness.Admission data collected and telemetry coverage started.

## 2018-04-04 NOTE — OT Progress Note (Signed)
[facility]  [address]  [place], [place] [address]  [phone]    Occupational Therapy Evaluation    Patient: Emily [name] MRN: [id number]   Unit: [facility] INTERMEDIATE CARE Bed: MI621/MI621-02    Time of treatment: Time Calculation  OT Received On: 04/04/18  Start Time: 1220  Stop Time: 1250  Time Calculation (min): 30 min    Consult received for Emily [name] for OT evaluation and treatment.  Patient's medical condition is appropriate for Occupational Therapy  intervention at this time.    Interpreter utilized: no, not indicated    D/C Suggestions   Based on today's performance, recommended discharge is to home with supv.    Transport Recommendations: no limitations    Equipment recommended for discharge: single point cane    Discharge recommendations are based on patient's progression/regression. Please see most recent note for updated discharge recommendations.    Assessment     Emily [name] is a 72 y.o. female admitted 04/03/2018.  Pt admitted with dizziness, and spinning sensation. Pt continues to c/o "feeling lousy", but is able to perform transfers and fxn'l mobility with HHA 100', don socks at EOB with supv. Pt would benefit from use of a cane, however, refuses to use one. Pt will benefit from supv upon d/c.      Expanded chart review completed including review of labs, review of imaging, review of vitals and review of past hospitalizations.  Pt's ability to complete ADLs and functional transfers is impaired due to the following deficits: decreased activity tolerance, decreased balance, decreased coordination, dizziness/vertigo, decreased insight, decreased problem solving, decreased strength and transfers .  Pt demonstrates performance deficits with grooming, dressing, toileting and functional mobility. There are a few comorbidities or other factors that affect plan of care and require modification of task including: vertigo/dizziness, has stairs to manage and lives alone.  Pt would continue  to benefit from OT to address these deficits and increase functional independence.          Complexity Chart Review Performance Deficits Clinical Decision Making Hx/Comorbidities Assistance needed   Low  Brief 1-3 Limited options None None (or at baseline)       PMP - Progressive Mobility Protocol   PMP Activity: Step 7 - Walks out of Room  Distance Walked (ft) (Step 6,7): 100 Feet     Rehabilitation Potential: good for goals    Interdisciplinary Communication: discussed with PT/RN    Plan     OT Plan  Risks/Benefits/POC Discussed with Pt/Family: With patient  Treatment Interventions: ADL retraining;UE strengthening/ROM;Functional transfer training;Endurance training;Patient/Family training;Equipment eval/education;Compensatory technique education  Discharge Recommendation: Home with supervision  DME Recommended for Discharge: Single point cane  OT Frequency Recommended: 1-2x/wk         Medical Diagnosis: Nausea [R11.0]  Vertigo [R42]  Weakness [R53.1]  Nystagmus [H55.00]    History of Present Illness: Emily [name] is a 72 y.o. female admitted on  04/03/2018 with "new onset of dizziness, spinning sensation, and also gait imbalance and nausea, this started this morning.  She reports her symptoms have been ongoing and consistent, with every time she moves her head or tries to get out of bed, she feels her symptoms.  She has undergone a CT scan of the head which is negative, she reports no numbness stinging paresthesias she reports no diplopia reports no trauma no headache or neck pain"      Patient Active Problem List   Diagnosis   . Altered mental  status   . Precordial pain   . Abnormal stress test   . Essential hypertension   . Other chest pain   . Malignant neoplasm of overlapping sites of right female breast   . Palpitations   . Lightheadedness   . Hypertension   . Sebaceous cyst of left axilla   . Mixed hyperlipidemia   . Vertigo   . HLD (hyperlipidemia)   . Hypothyroid     Past Medical History:   Diagnosis Date    . Breast cancer 2014    right breast mastectomy   . Hyperlipidemia    . Hypertension    . Malignant neoplasm of overlapping sites of right female breast 10/19/2015     Past Surgical History:   Procedure Laterality Date   . HYSTERECTOMY     . MASTECTOMY Right 2015         X-Rays/Tests/Labs:  Lab Results   Component Value Date/Time    HGB 13.0 04/04/2018 06:59 AM    HCT 43.8 (H) 04/04/2018 06:59 AM    K 4.6 04/04/2018 06:59 AM    NA 143 04/04/2018 06:59 AM    INR 0.9 07/10/2017 06:28 AM    TROPI <0.01 04/03/2018 01:52 PM    TROPI <0.01 02/07/2018 06:37 PM    TROPI <0.01 09/13/2017 12:52 PM    TROPI <0.01 07/10/2017 06:28 AM    TROPI 0.01 07/15/2009 09:40 AM     MRI Brain W WO Contrast (Order #[id number]) on 04/03/2018 - Imaging Information   IMPRESSION:     1. No acute intracranial abnormality.  2. Foci of hyperintense FLAIR signal again noted without interval  change. This is nonspecific but may be related to chronic ischemic  changes from small vessel disease.    Social History:  Lives alone in a apartment.  Entry Steps: 1 flight, no elevator access   Rails: yes Inside steps: 0  Rails: 0  Equipment at home:  tub/shower combination  Prior Level of Function:  indep all ADL/IADL, no DME/AE at home, cooks, drives                Cognition: WFL    Mobility: independent   Feeding: independent   Grooming: independent   Bathing: independent   Dressing: independent   Toileting: independent         Subjective     Patient is agreeable to participation in the therapy session. Nursing clears patient for therapy.  Patient's Goal:  To get better  Pain: pt denies pain      Objective     Precautions:   Precautions  Weight Bearing Status: no restrictions  Other Precautions: falls, dizziness    Patient is in bed with  Telemetry and Intravenous Access  in place.     Observation of patient/vitals: stable BP, negative orthostatics  Vitals:    04/04/18 0811 04/04/18 0919 04/04/18 0932 04/04/18 1238   BP: 134/68 135/65 135/77 131/62   Pulse:  66      Resp: 18   18   Temp: 98 F (36.7 C)   97.8 F (36.6 C)   TempSrc: Oral   Oral   SpO2: 98%   100%   Weight:       Height:           Orientation/Cognition:     Alert and Oriented x 4  Cognition: follows all commands      Musculoskeletal Examination:   ROM Strength   RUE WFL WFL   LUE WFL  WFL     Sensation: Intact to light touch, denies N/T throughout B UE's.   Coordination: Intact gross motor and serial opposition to B hands    Vision: WFL, glasses  Hearing: WFL      Functional Mobility:    Supine to sit: CGA  Scooting: CGA  Sit to Supine CGA  Sit to stand: SBA  Stand to sit: SBA  Transfers: off/on bed  Ambulation: HHA 100'    Balance:  Static Sit Balance: good  Dynamic Sit Balance: good  Static Stand Balance: fair +  Dynamic Stand Balance: fair +, HHA    Self Care:  Feeding: Independent  LB Dressing: supv socks while seated      Endurance: good    Participation:  good    Education:  Educated the patient to role of occupational therapy, plan of care, goals  of therapy and safety with mobility and ADLs.    RN notified of session outcome and that patient was left in bed with all needs met and equipment intact.   Safety measures include: handoff to nurse/clin tech/ unit secretary completed, oriented to call bell and placed within reach, personal items within reach, assistive device positioned out of reach and bed placed in lowest position.   Mobility and ADL status posted at bedside and within E.M.R.                Goals:  Goals  Goal Formulation: Patient  Time For Goal Achievement: 3 visits  Goals: Select goal  Patient will groom self: Modified Independent  Patient will dress lower body: Modified Independent  Patient will toilet: Modified Independent      Signature:   Eva Vallee, OTR/L  04/04/2018  2:02 PM  Phone [phone]    (For scheduling questions, please contact rehab tech [phone])

## 2018-04-04 NOTE — Progress Notes (Signed)
Date Time: 04/04/18 2:28 PM  Patient Name: Emily [name]  Attending Physician: Arna Medici, MD  Patient Class: Observation  Hospital Day: 1            NEUROLOGY PROGRESS NOTE             Assessment/Plan   Dizziness spinning sensation, currently 70% better, likely vestibular dysfunction especially in light of negative brain MRI which was personally reviewed      PRN meclizine  Cawthorne's exercises  Okay to discharge from my standpoint      Subjective:   Patient Seen and Examined. I am doing much better     Review of Systems:   No headache, eye, ear nose, throat problems; no coughing or wheezing or shortness of breath, No chest pain or orthopnea, no abdominal pain, nausea or vomiting, No pain in the body or extremities, no psychiatric, neurological, endocrine, hematological or cardiac complaints except as noted above.      Physical Exam:   BP 131/62   Pulse 66   Temp 97.8 F (36.6 C) (Oral)   Resp 18   Ht 1.6 m (5\' 3" )   Wt 74.1 kg (163 lb 4.8 oz)   SpO2 100%   BMI 28.93 kg/m   Awake   Fluent   Smile symmetric  Moving both arms and legs well    Meds:      Scheduled Meds: PRN Meds:      amLODIPine 5 mg Oral Daily   anastrozole 1 mg Oral Daily   aspirin 81 mg Oral Daily at 1100   docusate sodium 100 mg Oral Q12H SCH   enoxaparin 40 mg Subcutaneous Q24H [facility]   levothyroxine 25 mcg Oral Daily at 0600   lisinopril 20 mg Oral Q12H SCH   pantoprazole 40 mg Oral QAM AC   rosuvastatin 20 mg Oral Daily       Continuous Infusions:     acetaminophen 650 mg Q4H PRN   Or     acetaminophen 650 mg Q4H PRN   meclizine 12.5 mg TID PRN   naloxone 0.2 mg PRN   ondansetron 4 mg Q6H PRN   Or     ondansetron 4 mg Q6H PRN               Labs:     Recent Labs  Lab 04/04/18  0659 04/03/18  1352   Glucose 94 97   BUN 17 17   Creatinine 0.9 0.9   Calcium 9.3 9.3   Sodium 143 140   Potassium 4.6 4.6   Chloride 107 106   CO2 27 25   Albumin  --  3.7   Magnesium  --  2.4   AST (SGOT)  --  24   ALT  --  19   Bilirubin, Total  --  0.4    Alkaline Phosphatase  --  107*       Recent Labs  Lab 04/04/18  0659 04/03/18  1352   WBC 9.67* 9.33   Hgb 13.0 12.2   Hematocrit 43.8* 40.7   MCV 89.2 88.3   MCH 26.5 26.5   MCHC 29.7* 30.0*   Platelets 345 341         No results for input(s): PTT, PT, INR in the last 72 hours.       Radiology Results (24 Hour)     Procedure Component Value Units Date/Time    MRI Brain W WO Contrast [[id number]] Collected:  04/03/18 2116  Order Status:  Completed Updated:  04/03/18 2126    Narrative:       HISTORY:  Dizziness    TECHNIQUE: An MRI of the brain was performed utilizing the following  pulse sequences: Sagittal and axial T1-weighted, T2-weighted axial,  FLAIR axial, axial gradients, and diffusion weighted axial images.  Following IV contrast administration, T1-weighted axial and T1-weighted  coronal images were obtained. 7.5 cc of Gadavist was administered.    PRIORS: 05/26/2017.    FINDINGS:    The ventricular system is normal in size and contour. There  is no intracranial hemorrhage. There are stable foci of hyperintense  FLAIR signal in the white matter of the cerebral hemispheres. There is  no herniation. There are no extraaxial fluid collections. The diffusion  sequences show no restriction to suggest an acute infarct. The  cerebellar tonsils are normally positioned.  There was no abnormal enhancement. The visualized portions of the  paranasal sinuses are clear.      Impression:         1. No acute intracranial abnormality.  2. Foci of hyperintense FLAIR signal again noted without interval  change. This is nonspecific but may be related to chronic ischemic  changes from small vessel disease.    Wyatt Portela, MD   04/03/2018 9:22 PM    CT Head without Contrast [[id number]] Collected:  04/03/18 1612    Order Status:  Completed Updated:  04/03/18 1618    Narrative:         INDICATION: Vertigo    COMPARISON: MRI brain 05/26/2017    TECHNIQUE: Computed tomography of the head was performed at 5 mm slice  thickness from  the skull base to the apex. A combination of automatic  exposure control and adjustment of the MA and/or KV was utilized  according to patient size.    FINDINGS:  The ventricular system is normal in size, shape and contour.  Chronic small vessel ischemic changes in the periventricular white  matter.  There is no acute edema, midline shift, hemorrhage, or extra-axial fluid  collection.  The gray white matter junctions are preserved.  The visualized portions of the paranasal sinuses are within normal  limits.  The visualized osseous structures are unremarkable.      Impression:        No acute intracranial abnormality    Kinnie Feil, MD   04/03/2018 4:14 PM           All brain imaging (MRI, CT) personally reviewed.    Case discussed with: pt       This note was generated by the Epic EMR system/ Dragon speech recognition and may contain inherent errors or omissions not intended by the user. Grammatical errors, random word insertions, deletions and pronoun errors  are occasional consequences of this technology due to software limitations.     Not all errors are caught or corrected. If there are questions or concerns about the content of this note or information contained within the body of this dictation they should be addressed directly with the author for clarification.      Signed by: Cathe Mons, MD  Spectralink: [id number]      Answering Service: [phone]

## 2018-04-04 NOTE — Discharge Instr - AVS First Page (Signed)
Reason for your Hospital Admission:  Dizziness       Instructions for after your discharge:  Possible vertigo, see attached

## 2018-04-04 NOTE — Plan of Care (Signed)
Problem: Every Day - Stroke  Goal: Core/Quality measure requirements - Daily  Outcome: Progressing   04/04/18 0104   Goal/Interventions addressed this shift   Core/Quality measure requirements - Daily  VTE Prevention: Ensure anticoagulant(s) administered and/or anti-embolism stockings/devices documented by end of day 2;Once lipid panel has resulted, check LDL. Contact provider for statin order if LDL > 70 (or ensure contraindication documented by LIP).;Continue stroke education (must include Modifiable Risk Factors, Warning Signs and Symptoms of Stroke, Activation of Emergency Medical System and Follow-up Appointments). Ensure handout has been given and documented.;Ensure antithrombotic administered or contraindication documented by LIP by end of day 2

## 2018-04-04 NOTE — UM Notes (Signed)
HPI 04/03/18  "Emily [name] is a 72 y.o. femalewith a history of hypertension, with a negative ekg stress test 1/19, HLD, a history of right breast cancer (T1N0M0, status post right total mastectomy and SLNB), Hypothyroidism, and GERD. She presented to ED with vertigo which started about 3 days ago, it worsen with increase movement, getting out of chair, and turning her heads from side to side. Her other associated symptoms are mild nausea and unsteady gait during the attack. Otherwise she denies recent falls, any visual deficit, fever, chest/abdominal pain, SOB, slurred speech, dysphagia, abdominal pain, numbness or weakness of the extremities..."     VS: 176/76, pulse (!) 54, temperature 97.6 , resp. rate 19, SpO2 100 %    CTof the head(-)    MRI of the brain(-)    ER TX  IVF 1 liter  Zofran 4mg  IV  Antivert 25mg  PO  Reglan 10mg  IV  Valium 5mg  po  ASA 325mg  PO      Admitted Inpatient with consult to Neurology: per latter    "  Assessment & Plan:   Sudden onset of dizziness spinning sensation gait imbalance nausea, worse with head change position changes, seems more likely to be vestibular dysfunction, but given vascular risk factors and age, need to rule out posterior circulation event, likely nevus on the lower side  ? MRI of the brain  ? PRN meclizine  ? Aspirin on empiric basis  ? If indeed new ischemia is seen further recommendations will be given accordingly"    Admit to Inpatient (Order [id number])   Admission   Date: 04/03/2018 at [facility]

## 2018-04-04 NOTE — Progress Notes (Signed)
No new rx sxs of medication explained along with follow up and to see her MD Friday redue some stitshes. D/C home with family

## 2018-04-04 NOTE — PT Eval Note (Signed)
[facility]  [address]  [place], [place] [address]  [phone]    Physical Therapy Evaluation    Patient: Emily [name] MRN: [id number]   Unit: [facility] Bed: MI621/MI621-02    Time of Treatment: Time Calculation  PT Received On: 04/04/18  Start Time: 1337  Stop Time: 1405  Time Calculation (min): 28 min    Consult received for Emily [name] for PT evaluation and treatment.  Patient's medical condition is appropriate for Physical Therapy  intervention at this time.    Interpreter utilized: no, not indicated      D/C Suggestions   Based on today's performance:  Recommendation  Discharge Recommendation: Home with supervision  DME Recommended for Discharge: Single point cane  PT Frequency: 3-4x/wk.    Transport Recommendations: no limitations    Discharge recommendations are based on patient's progression/regression. Please see most recent note for updated discharge recommendations.    Assessment   GIAH FICKETT is a 72 y.o. female admitted 04/03/2018 with dizziness.  Pt reports slight improvements in dizziness but still c/o residual dizziness in sitting.  No nystagmus noted with vestibular testing, possible increases in R sided dizziness but unclear.  Pt lives alone and typically independent with all mobility.  Currently requiring HHA for ambulating x 100' and going up/down stairs.  Recommended to pt use of SPC but pt very resistant to use.  Pt will benefit from supervision upon discharge due to dizziness and imbalance.      Pt's functional mobility is impacted by:  decreased activity tolerance, decreased balance, dizziness/vertigo, decreased insight, decreased safety awareness, decreased problem solving, decreased strength and transfers .  There are a few comorbidities or other factors that affect plan of care and require modification of task including: vertigo/dizziness, has stairs to manage and lives alone.  Standardized tests and exams incorporated into evaluation include AMPAC  mobility, balance, cognition/orientation, coordination, ROM  and Strength.  Pt demonstrates a stable clinical presentation.   Pt would continue to benefit from PT to address these deficits and increase functional independence.     Complexity Level Hx and Co  morbidites Examination Clinical Decision Making Clinical Presentation   Low  no impact 1-2 elements Limited options Stable       PMP - Progressive Mobility Protocol   PMP Activity: Step 7 - Walks out of Room  Distance Walked (ft) (Step 6,7): 100 Feet       Rehabilitation Potential: Good for goals       Interdisciplinary Communication: RN, OT      Plan       Plan  Risks/Benefits/POC Discussed with Pt/Family: With patient  Treatment/Interventions: Gait training;Stair training;Neuromuscular re-education;Functional transfer training  PT Frequency: 3-4x/wk         Medical Diagnosis: Nausea [R11.0]  Vertigo [R42]  Weakness [R53.1]  Nystagmus [H55.00]    History of Present Illness: Emily [name] is a 72 y.o. female admitted on  04/03/2018 with "new onset of dizziness, spinning sensation, and also gait imbalance and nausea, this started this morning. She reports her symptoms have been ongoing and consistent, with every time she moves her head or tries to get out of bed, she feels her symptoms. She has undergone a CT scan of the head which is negative, she reports no numbness stinging paresthesias she reports no diplopia reports no trauma no headache or neck pain"      Patient Active Problem List   Diagnosis   . Altered mental status   .  Precordial pain   . Abnormal stress test   . Essential hypertension   . Other chest pain   . Malignant neoplasm of overlapping sites of right female breast   . Palpitations   . Lightheadedness   . Hypertension   . Sebaceous cyst of left axilla   . Mixed hyperlipidemia   . Vertigo   . HLD (hyperlipidemia)   . Hypothyroid     Past Medical History:   Diagnosis Date   . Breast cancer 2014    right breast mastectomy   . Hyperlipidemia    .  Hypertension    . Malignant neoplasm of overlapping sites of right female breast 10/19/2015     Past Surgical History:   Procedure Laterality Date   . HYSTERECTOMY     . MASTECTOMY Right 2015       X-Rays/Tests/Labs:  Lab Results   Component Value Date/Time    HGB 13.0 04/04/2018 06:59 AM    HCT 43.8 (H) 04/04/2018 06:59 AM    K 4.6 04/04/2018 06:59 AM    NA 143 04/04/2018 06:59 AM    INR 0.9 07/10/2017 06:28 AM    TROPI <0.01 04/03/2018 01:52 PM    TROPI <0.01 02/07/2018 06:37 PM    TROPI <0.01 09/13/2017 12:52 PM    TROPI <0.01 07/10/2017 06:28 AM    TROPI 0.01 07/15/2009 09:40 AM       MRI Brain W WO Contrast (Order #[id number]) on 04/03/2018 - Imaging Information   IMPRESSION:     1. No acute intracranial abnormality.  2. Foci of hyperintense FLAIR signal again noted without interval  change. This is nonspecific but may be related to chronic ischemic  changes from small vessel disease.    Social History:  Lives alonein a apartment.  Entry Steps: 1 flight, no elevator accessRails: yesInside steps: 0Rails: 0  Equipment at home: tub/shower combination  Prior Level of Function: indep all ADL/IADL, no DME/AE at home, cooks, drives  Cognition: WFL                     Mobility: independent             Feeding: independent             Grooming: independent             Bathing: independent             Dressing: independent             Toileting: independent    Subjective   Patient is agreeable to participation in the therapy session. Nursing clears patient for therapy.  Patient's Goal:  To get better  Pain: pt denies pain    Objective     Precautions/ Contraindications:   Precautions  Weight Bearing Status: no restrictions  Other Precautions: falls, dizziness    Patient is in bed with  Telemetry and Intravenous Access in place.       Observation of patient/vitals:      Vitals:    04/04/18 0811 04/04/18 0919 04/04/18 0932 04/04/18 1238   BP: 134/68 135/65 135/77 131/62   Pulse: 66      Resp: 18    18   Temp: 98 F (36.7 C)   97.8 F (36.6 C)   TempSrc: Oral   Oral   SpO2: 98%   100%   Weight:       Height:           Orientation/Cognition:  Alert and Oriented x 4  Cognition: Able to follow all commands      Musculoskeletal Examination:      ROM Strength   Neck/ Trunk WFL WFL, possible increase in R sided dizziness with R cervical rotation   RLE [facility] WFL   LLE [facility] WFL     Sensation: Intact to light touch. Denies numbness/tingling.   Coordination: Intact to gross motor.     Functional Mobility:    Supine to sit: CGA  Scooting: CGA  Sit to Supine: CGA  Sit to stand: CGA  Stand to sit: SBA  Transfers: CGA    Ambulation:     Weightbearing: no restrictions   Assistance level: HHA   Distance: 100'   Assistive Device: no AD   Gait Deviations: unsteady, 2 LOB   Stairs: up/down 10 steps with use of handrail, extended rest break after ascending and after descending due to increased dizziness    Balance:  Static Sit: Good  Dynamic Sit: good  Static Stand: fair  Dynamic Stand: fair    Vestibular Screening    CNS Tests: (ex- CVA, tumor, ETOH/drug)    Smooth Pursuits - (Tongue depressor with dot (or finger), follow with eyes, head still, "H" pattern, Abnormal: backing away/blinking) - No nystagmus    Gaze Evoked Nystagmus - (hold gaze to R/L/up/down about 20-30 degrees from midline - central if multi-directional) - No nystagmus, possible increase in "right sided dizziness" when holding gaze R    Peripheral Tests: (ex- neuritis, labyrinthitis)    Spontaneous Nystagmus - (steady gaze looking at stationary object/finger tip)-- no nystagmus    BPPV -    Side-lying Test: Increased dizziness with side lying right, no nystagmus.  No changes with side lying left     Endurance: good    Participation:  good    Education:  Educated the patient to role of physical therapy, plan of care, goals  of therapy and safety with mobility and ADLs.    RN notified of session outcome and that patient was left in bed with all needs met and  equipment intact.   Safety measures include: handoff to nurse/clin tech/ unit secretary completed, oriented to call bell and placed within reach, personal items within reach, assistive device positioned out of reach, bed placed in lowest position and family/caregiver at bedside.   Mobility and ADL status posted at bedside and within E.M.R.    AM-PACT Inpatient Short Forms  Inpatient AM-PACT Performed? (PT): Basic Mobility Inpatient Short Form   AM-PACT "6 Clicks" Basic Mobility Inpatient Short Form  Turning Over in Bed: None  Sitting Down On/Standing From Armchair: A little  Lying on Back to Sitting on Side of Bed: A little  Assist Moving to/from Bed to Chair: A little  Assist to Walk in Hospital Room: A little  Assist to Climb 3-5 Steps with Railing: A little  PT Basic Mobility Raw Score: 19  CMS 0-100% Score: 41.77%      Goals  Goal Formulation: With patient  Time for Goal Acheivement: 3 visits  Goals: Select goal  Pt Will Perform Sit to Stand: modified independent  Pt Will Ambulate: > 200 feet;independent  Pt Will Go Up / Down Stairs: independent       Signature:  Muhammad Vacca, PT  04/04/2018  2:12 PM  Phone: [phone]    (For scheduling questions, please contact rehab tech [phone])

## 2018-04-04 NOTE — Progress Notes (Signed)
RX reviewed sxs explained along with inner ear exersizes and follow up all with good understanding. D/C home with family

## 2018-04-05 LAB — ECG 12-LEAD
Atrial Rate: 56 {beats}/min
P Axis: 54 degrees
P-R Interval: 180 ms
Q-T Interval: 412 ms
QRS Duration: 92 ms
QTC Calculation (Bezet): 397 ms
R Axis: 26 degrees
T Axis: 18 degrees
Ventricular Rate: 56 {beats}/min

## 2018-04-25 ENCOUNTER — Other Ambulatory Visit: Payer: Self-pay | Admitting: Gerontology

## 2018-04-25 DIAGNOSIS — N6320 Unspecified lump in the left breast, unspecified quadrant: Secondary | ICD-10-CM

## 2018-04-30 ENCOUNTER — Ambulatory Visit
Admission: RE | Admit: 2018-04-30 | Discharge: 2018-04-30 | Disposition: A | Discharge status: Home / Self Care | Payer: Medicare Other | Source: Ambulatory Visit | Attending: Gerontology | Admitting: Gerontology

## 2018-04-30 DIAGNOSIS — N6320 Unspecified lump in the left breast, unspecified quadrant: Secondary | ICD-10-CM

## 2018-05-09 ENCOUNTER — Ambulatory Visit (INDEPENDENT_AMBULATORY_CARE_PROVIDER_SITE_OTHER): Payer: Medicare Other | Admitting: Cardiology

## 2018-05-09 ENCOUNTER — Encounter (INDEPENDENT_AMBULATORY_CARE_PROVIDER_SITE_OTHER): Payer: Self-pay | Admitting: Cardiology

## 2018-05-09 VITALS — BP 136/78 | HR 78 | Ht 63.0 in | Wt 164.0 lb

## 2018-05-09 DIAGNOSIS — I10 Essential (primary) hypertension: Secondary | ICD-10-CM

## 2018-05-09 DIAGNOSIS — R9439 Abnormal result of other cardiovascular function study: Secondary | ICD-10-CM

## 2018-05-09 DIAGNOSIS — E782 Mixed hyperlipidemia: Secondary | ICD-10-CM

## 2018-05-09 MED ORDER — ROSUVASTATIN CALCIUM 40 MG PO TABS
40.00 mg | ORAL_TABLET | Freq: Every day | ORAL | 3 refills | Status: DC
Start: 2018-05-09 — End: 2018-05-14

## 2018-05-09 NOTE — Patient Instructions (Signed)
INCREASE CRESTOR TO 40 MG DAILY  FASTING BLOOD TEST IN 2 MONTHS

## 2018-05-09 NOTE — Progress Notes (Signed)
CARDIOLOGY PROGRESS NOTE    I had the pleasure of seeing Emily [name] today for cardiovascular follow up. She is a pleasant 72 y.o. female with a history of hypertension and hyperlipidemia and 2 second palpitations 2-3 times a month without lightheadedness with a negative ekg stress test 1/19 and a previous CT chest with calcified granulomas and rt inf lung scarring.History of hypertension and hyperlipidemia (not controlled).The patient denies chest discomfort, shortness of breath, racing heart beat, syncope or near syncope.            MEDICATIONS:  Current Outpatient Prescriptions   Medication Sig Dispense Refill   . acetaminophen (TYLENOL) 500 MG tablet Take 500 mg by mouth as needed for Pain.     Marland Kitchen amLODIPine (NORVASC) 5 MG tablet Take 1 tablet (5 mg total) by mouth daily 30 tablet 3   . anastrozole (ARIMIDEX) 1 MG tablet Take 1 tablet (1 mg total) by mouth daily 90 tablet 3   . aspirin 81 MG EC tablet Take 81 mg by mouth daily.     Marland Kitchen ibuprofen (ADVIL,MOTRIN) 600 MG tablet Take 1 tablet (600 mg total) by mouth every 6 (six) hours as needed for Pain.for up to 30 doses 30 tablet 0   . levothyroxine (SYNTHROID, LEVOTHROID) 25 MCG tablet Take 25 mcg by mouth every morning.         Marland Kitchen lisinopril (PRINIVIL,ZESTRIL) 20 MG tablet Take 1 tablet (20 mg total) by mouth 2 (two) times daily. 180 tablet 3   . meclizine (ANTIVERT) 25 MG tablet Take 1 tablet (25 mg total) by mouth 3 (three) times daily as needed for Dizziness 20 tablet 0   . Multiple Vitamins-Minerals (MULTIVITAMIN WITH MINERALS) tablet Take 1 tablet by mouth daily.     Marland Kitchen omeprazole (PRILOSEC) 10 MG capsule Take 2 capsules (20 mg total) by mouth daily. 90 capsule 3   . rosuvastatin (CRESTOR) 20 MG tablet Take 1 tablet (20 mg total) by mouth daily 30 tablet 3   . vitamin B-1 (THIAMINE) 100 MG tablet Take 100 mg by mouth every morning.           REVIEW OF SYSTEMS: All other systems reviewed and negative except as above.    PHYSICAL EXAMINATION  General  Appearance: well-appearing and in no acute distress.   Vital Signs: BP 136/78   Pulse 78   Ht 1.6 m (5\' 3" )   Wt 74.4 kg (164 lb)   SpO2 98%   BMI 29.05 kg/m    HEENT: Sclera anicteric, conjunctiva without pallor, moist mucous membranes.  Neck: Supple without jugular venous distention.  Normal carotid upstrokes without bruits.  Chest: Rales right base  Cardiovascular: Normal S1 and  S2 without murmurs, gallops or rub. PMI of normal size and nondisplaced.   Abdomen: Soft, nontender. No organomegaly.  No pulsatile masses or bruits.    Extremities: Warm without edema. All peripheral pulses are full and equal.  Dorsalis pedis 2+ and equal  Neuro: Alert and oriented x3. Grossly intact. Strength is symmetrical. Normal mood and affect.     ECG: Normal sinus rhythm nonspecific ST-T change  Past Medical History:   Diagnosis Date   . Breast cancer 2014    right breast mastectomy   . Hyperlipidemia    . Hypertension    . Malignant neoplasm of overlapping sites of right female breast 10/19/2015     Family History   Problem Relation Age of Onset   . Breast cancer Neg Hx  Social History     Social History   . Marital status: Widowed     Spouse name: N/A   . Number of children: N/A   . Years of education: N/A     Social History Main Topics   . Smoking status: Never Smoker   . Smokeless tobacco: Never Used   . Alcohol use 0.6 oz/week     1 Cans of beer per week      Comment: socially   . Drug use: No   . Sexual activity: Not Asked     Other Topics Concern   . None     Social History Narrative   . None         IMPRESSION:  Hypertension controlled  Scarring and granulomas of the lung with rales on exam  Rare palpitations without near syncope   Hyperlipidemia not controlled    PLAN:   Increase rosuvastatin from 20 mg daily to 40 mg daily and obtain fasting lipids and liver function test in 2 months  6 months follow-up      Royann Shivers, MD [facility]  05/09/2018

## 2018-05-10 ENCOUNTER — Encounter (INDEPENDENT_AMBULATORY_CARE_PROVIDER_SITE_OTHER): Payer: Self-pay | Admitting: Cardiology

## 2018-05-14 ENCOUNTER — Emergency Department: Payer: Medicare Other

## 2018-05-14 ENCOUNTER — Observation Stay: Payer: Medicare Other

## 2018-05-14 ENCOUNTER — Observation Stay
Admission: EM | Admit: 2018-05-14 | Discharge: 2018-05-15 | Disposition: A | Discharge status: Home / Self Care | Payer: Medicare Other | Attending: Internal Medicine | Admitting: Internal Medicine

## 2018-05-14 DIAGNOSIS — H538 Other visual disturbances: Secondary | ICD-10-CM | POA: Insufficient documentation

## 2018-05-14 DIAGNOSIS — E782 Mixed hyperlipidemia: Secondary | ICD-10-CM | POA: Insufficient documentation

## 2018-05-14 DIAGNOSIS — I6523 Occlusion and stenosis of bilateral carotid arteries: Secondary | ICD-10-CM | POA: Insufficient documentation

## 2018-05-14 DIAGNOSIS — R414 Neurologic neglect syndrome: Secondary | ICD-10-CM | POA: Insufficient documentation

## 2018-05-14 DIAGNOSIS — I10 Essential (primary) hypertension: Secondary | ICD-10-CM | POA: Diagnosis present

## 2018-05-14 DIAGNOSIS — E039 Hypothyroidism, unspecified: Secondary | ICD-10-CM | POA: Insufficient documentation

## 2018-05-14 DIAGNOSIS — K219 Gastro-esophageal reflux disease without esophagitis: Secondary | ICD-10-CM | POA: Insufficient documentation

## 2018-05-14 DIAGNOSIS — R29810 Facial weakness: Secondary | ICD-10-CM | POA: Insufficient documentation

## 2018-05-14 DIAGNOSIS — R531 Weakness: Secondary | ICD-10-CM

## 2018-05-14 DIAGNOSIS — R296 Repeated falls: Secondary | ICD-10-CM | POA: Insufficient documentation

## 2018-05-14 DIAGNOSIS — R1013 Epigastric pain: Secondary | ICD-10-CM

## 2018-05-14 DIAGNOSIS — Z7982 Long term (current) use of aspirin: Secondary | ICD-10-CM | POA: Insufficient documentation

## 2018-05-14 DIAGNOSIS — Z79899 Other long term (current) drug therapy: Secondary | ICD-10-CM | POA: Insufficient documentation

## 2018-05-14 DIAGNOSIS — M6281 Muscle weakness (generalized): Principal | ICD-10-CM | POA: Insufficient documentation

## 2018-05-14 DIAGNOSIS — C50811 Malignant neoplasm of overlapping sites of right female breast: Secondary | ICD-10-CM | POA: Diagnosis present

## 2018-05-14 DIAGNOSIS — R202 Paresthesia of skin: Secondary | ICD-10-CM | POA: Insufficient documentation

## 2018-05-14 DIAGNOSIS — Z853 Personal history of malignant neoplasm of breast: Secondary | ICD-10-CM | POA: Insufficient documentation

## 2018-05-14 DIAGNOSIS — Z79811 Long term (current) use of aromatase inhibitors: Secondary | ICD-10-CM | POA: Insufficient documentation

## 2018-05-14 HISTORY — DX: Gastro-esophageal reflux disease without esophagitis: K21.9

## 2018-05-14 HISTORY — DX: Hypothyroidism, unspecified: E03.9

## 2018-05-14 HISTORY — DX: Dizziness and giddiness: R42

## 2018-05-14 LAB — COMPREHENSIVE METABOLIC PANEL
ALT: 15 U/L (ref 0–55)
AST (SGOT): 16 U/L (ref 5–34)
Albumin/Globulin Ratio: 1.5 (ref 0.9–2.2)
Albumin: 3.2 g/dL — ABNORMAL LOW (ref 3.5–5.0)
Alkaline Phosphatase: 83 U/L (ref 37–106)
Anion Gap: 6 (ref 5.0–15.0)
BUN: 13 mg/dL (ref 7–19)
Bilirubin, Total: 0.3 mg/dL (ref 0.2–1.2)
CO2: 32 mEq/L — ABNORMAL HIGH (ref 22–29)
Calcium: 8.5 mg/dL (ref 7.9–10.2)
Chloride: 104 mEq/L (ref 100–111)
Creatinine: 0.9 mg/dL (ref 0.6–1.0)
Globulin: 2.1 g/dL (ref 2.0–3.6)
Glucose: 102 mg/dL — ABNORMAL HIGH (ref 70–100)
Potassium: 3.8 mEq/L (ref 3.5–5.1)
Protein, Total: 5.3 g/dL — ABNORMAL LOW (ref 6.0–8.3)
Sodium: 142 mEq/L (ref 136–145)

## 2018-05-14 LAB — CBC AND DIFFERENTIAL
Absolute NRBC: 0 10*3/uL (ref 0.00–0.00)
Basophils Absolute Automated: 0.02 10*3/uL (ref 0.00–0.08)
Basophils Automated: 0.3 %
Eosinophils Absolute Automated: 0.05 10*3/uL (ref 0.00–0.44)
Eosinophils Automated: 0.7 %
Hematocrit: 32.8 % — ABNORMAL LOW (ref 34.7–43.7)
Hgb: 9.9 g/dL — ABNORMAL LOW (ref 11.4–14.8)
Immature Granulocytes Absolute: 0.02 10*3/uL (ref 0.00–0.07)
Immature Granulocytes: 0.3 %
Lymphocytes Absolute Automated: 2.31 10*3/uL (ref 0.42–3.22)
Lymphocytes Automated: 30.9 %
MCH: 26.9 pg (ref 25.1–33.5)
MCHC: 30.2 g/dL — ABNORMAL LOW (ref 31.5–35.8)
MCV: 89.1 fL (ref 78.0–96.0)
MPV: 10.8 fL (ref 8.9–12.5)
Monocytes Absolute Automated: 0.63 10*3/uL (ref 0.21–0.85)
Monocytes: 8.4 %
Neutrophils Absolute: 4.45 10*3/uL (ref 1.10–6.33)
Neutrophils: 59.4 %
Nucleated RBC: 0 /100 WBC (ref 0.0–0.0)
Platelets: 272 10*3/uL (ref 142–346)
RBC: 3.68 10*6/uL — ABNORMAL LOW (ref 3.90–5.10)
RDW: 14 % (ref 11–15)
WBC: 7.48 10*3/uL (ref 3.10–9.50)

## 2018-05-14 LAB — GFR: EGFR: >60

## 2018-05-14 LAB — TSH: TSH: 0.5 u[IU]/mL (ref 0.35–4.94)

## 2018-05-14 LAB — GLUCOSE WHOLE BLOOD - POCT: Whole Blood Glucose POCT: 92 mg/dL (ref 70–100)

## 2018-05-14 LAB — PT/INR
PT INR: 1 (ref 0.9–1.1)
PT: 13.5 s (ref 12.6–15.0)

## 2018-05-14 LAB — TROPONIN I: Troponin I: <0.01 ng/mL (ref 0.00–0.09)

## 2018-05-14 LAB — APTT: PTT: 32 s (ref 23–37)

## 2018-05-14 MED ORDER — ANASTROZOLE 1 MG PO TABS
1.0000 mg | ORAL_TABLET | Freq: Every day | ORAL | Status: DC
  Administered 2018-05-15: 1 mg via ORAL
  Filled 2018-05-14 (×3): qty 1

## 2018-05-14 MED ORDER — ACETAMINOPHEN 650 MG RE SUPP
650.00 mg | RECTAL | Status: DC | PRN
Start: 2018-05-14 — End: 2018-05-15

## 2018-05-14 MED ORDER — LEVOTHYROXINE SODIUM 25 MCG PO TABS
25.00 ug | ORAL_TABLET | Freq: Every morning | ORAL | Status: DC
Start: 2018-05-14 — End: 2018-05-15
  Administered 2018-05-15: 25 ug via ORAL
  Filled 2018-05-14: qty 1

## 2018-05-14 MED ORDER — ENOXAPARIN SODIUM 40 MG/0.4ML SC SOLN
40.00 mg | Freq: Every day | SUBCUTANEOUS | Status: DC
Start: 2018-05-14 — End: 2018-05-15
  Administered 2018-05-14 – 2018-05-15 (×2): 40 mg via SUBCUTANEOUS
  Filled 2018-05-14 (×2): qty 0.4

## 2018-05-14 MED ORDER — NALOXONE HCL 0.4 MG/ML IJ SOLN (WRAP)
0.20 mg | INTRAMUSCULAR | Status: DC | PRN
Start: 2018-05-14 — End: 2018-05-15

## 2018-05-14 MED ORDER — IODIXANOL 320 MG/ML IV SOLN
50.00 mL | Freq: Once | INTRAVENOUS | Status: AC | PRN
Start: 2018-05-14 — End: 2018-05-14
  Administered 2018-05-14: 50 mL via INTRAVENOUS

## 2018-05-14 MED ORDER — ONDANSETRON HCL 4 MG/2ML IJ SOLN
4.00 mg | Freq: Four times a day (QID) | INTRAMUSCULAR | Status: DC | PRN
Start: 2018-05-14 — End: 2018-05-15

## 2018-05-14 MED ORDER — IODIXANOL 320 MG/ML IV SOLN
100.00 mL | Freq: Once | INTRAVENOUS | Status: AC | PRN
Start: 2018-05-14 — End: 2018-05-14
  Administered 2018-05-14: 100 mL via INTRAVENOUS

## 2018-05-14 MED ORDER — GADOBUTROL 1 MMOL/ML IV SOLN
7.50 mL | Freq: Once | INTRAVENOUS | Status: AC | PRN
Start: 2018-05-14 — End: 2018-05-14
  Administered 2018-05-14: 7.5 mmol via INTRAVENOUS

## 2018-05-14 MED ORDER — ACETAMINOPHEN 325 MG PO TABS
650.00 mg | ORAL_TABLET | ORAL | Status: DC | PRN
Start: 2018-05-14 — End: 2018-05-15
  Administered 2018-05-14: 650 mg via ORAL
  Filled 2018-05-14: qty 2

## 2018-05-14 MED ORDER — ONDANSETRON 4 MG PO TBDP
4.00 mg | ORAL_TABLET | Freq: Four times a day (QID) | ORAL | Status: DC | PRN
Start: 2018-05-14 — End: 2018-05-15

## 2018-05-14 MED ORDER — ACETAMINOPHEN 325 MG PO TABS
650.00 mg | ORAL_TABLET | ORAL | Status: DC | PRN
Start: 2018-05-14 — End: 2018-05-14

## 2018-05-14 MED ORDER — ROSUVASTATIN CALCIUM 10 MG PO TABS
40.00 mg | ORAL_TABLET | Freq: Every day | ORAL | Status: DC
Start: 2018-05-14 — End: 2018-05-15
  Administered 2018-05-14: 40 mg via ORAL
  Filled 2018-05-14: qty 4

## 2018-05-14 MED ORDER — ASPIRIN 325 MG PO TABS
325.00 mg | ORAL_TABLET | Freq: Every day | ORAL | Status: AC
Start: 2018-05-14 — End: 2018-05-14
  Administered 2018-05-14: 325 mg via ORAL
  Filled 2018-05-14: qty 1

## 2018-05-14 MED ORDER — ACETAMINOPHEN 650 MG RE SUPP
650.00 mg | RECTAL | Status: DC | PRN
Start: 2018-05-14 — End: 2018-05-14

## 2018-05-14 NOTE — Stroke Progress Note (Signed)
Pt alert and oriented. C/o left frontal headache.  PERRLA.  Slight flatening on left side of mouth.  Less sensation on left arm and leg.  Weakness bilateral lower legs.  Emily [name]

## 2018-05-14 NOTE — Consults (Signed)
NEUROLOGY CONSULTATION    Date Time: 05/14/18 4:56 PM  Patient Name: Emily [name]  Attending Physician: Shela Nevin, MD      Assessment & Plan:   New onset of left-sided numbness weakness, arm and leg, currently resolved--?  TIA especially in light of negative imaging  History of high lipids  Prior history of high blood pressure     Continue aspirin and statin that she is already on   Check urine analysis and culture   We will follow    History of Present Illness:   Patient is a 72 year old lady, who says she woke up in the middle of the night and felt tingling and numbness, on the left face arm and leg, her symptoms remain persistent when she woke up again, after going back to sleep because her symptoms remain persistent she decided to come to the emergency room, her symptoms at this time has resolved she has already undergone a CT angios CT perfusion of the head and neck with a negative brain MRI is complete  And  is  with no acute infarct.            Past Medical History:     Past Medical History:   Diagnosis Date   . Breast cancer 2014    right breast mastectomy   . GERD (gastroesophageal reflux disease)    . Hyperlipidemia    . Hypertension    . Hypothyroidism    . Malignant neoplasm of overlapping sites of right female breast 10/19/2015   . Vertigo        Meds:   Tylenol Norvasc Arimidex aspirin Synthroid lisinopril meclizine      Allergies   Allergen Reactions   . Caffeine      Personal preference   . Morphine Itching   . Morphine And Related Itching and Nausea And Vomiting     dizzy       Social & Family History:     Social History     Social History   . Marital status: Widowed     Spouse name: N/A   . Number of children: N/A   . Years of education: N/A     Occupational History   . Not on file.     Social History Main Topics   . Smoking status: Never Smoker   . Smokeless tobacco: Never Used   . Alcohol use 0.6 oz/week     1 Cans of beer per week      Comment: socially   . Drug use: No   . Sexual  activity: Not on file     Other Topics Concern   . Not on file     Social History Narrative   . No narrative on file     Family History   Problem Relation Age of Onset   . Breast cancer Neg Hx            CODE STATUS: full     Review of Systems:   No headache, eye, ear nose, throat problems; no coughing or wheezing or shortness of breath, No chest pain or orthopnea, no abdominal pain, nausea or vomiting, No pain in the body or extremities, no psychiatric, neurological, endocrine, hematological or cardiac complaints except as noted above.       Physical Exam:   Blood pressure 174/84, pulse 71, temperature 98.6 F (37 C), temperature source Oral, resp. rate 20, weight 76 kg (167 lb 8.8 oz), SpO2 98 %.  HEENT: Normocephalic.no carotid bruits  Lungs:  CTA bil  Abd Soft   Cardiac:  S1,S2, normal rate and rhythm  Neck: supple, no cartoid bruits  Extremities: no edema  Skin: no rashes seen in exposed areas     Neuro:  Level of consciousness:  Alert and appropriate  Oriented:  X 3  Cognition:  Intact naming, recognition, concentration and following complex commands  Cranial Nerves:  II-XII intact  Strength:  No upper extremity drift, 5/5 strength x 4 extremities  Coordination:  Intact FTN testing  Reflexes:  +2 throughout, down going toes bil  Sensation: Reduced light touch temperature left arm and left leg and the face  Labs:     Recent Labs  Lab 05/14/18  1024   Glucose 102*   BUN 13   Creatinine 0.9   Calcium 8.5   Sodium 142   Potassium 3.8   Chloride 104   CO2 32*   Albumin 3.2*   AST (SGOT) 16   ALT 15   Bilirubin, Total 0.3   Alkaline Phosphatase 83       Recent Labs  Lab 05/14/18  1024   WBC 7.48   Hgb 9.9*   Hematocrit 32.8*   MCV 89.1   MCH 26.9   MCHC 30.2*   Platelets 272         Recent Labs      05/14/18   1024   PTT  32   PT  13.5   PT INR  1.0          Radiology Results (24 Hour)     Procedure Component Value Units Date/Time    MRI Brain with/without Contrast [[id number]] Collected:  05/14/18 1259    Order  Status:  Completed Updated:  05/14/18 1309    Narrative:       HISTORY:  Transient ischemic attack. CVA.    TECHNIQUE: Nonenhanced followed by enhanced  MRI of the brain was  performed utilizing the following pulse sequences:  sagittal and axial  T1-weighted , T2-weighted axial, FLAIR axial, axial gradients, and  diffusion weighted axial images. 7.5 cc of gadavist were given  intravenously.    PRIORS: 04/03/2018.    FINDINGS:         The ventricular system is normal in size and contour.   There is no acute intracranial hemorrhage. There is no herniation.   There are no extra-axial fluid collections.   There are no concerning areas of abnormal signal intensity.   The diffusion sequences show no restriction to suggest an acute infarct.    The cerebellar tonsils are normally positioned.   There was no abnormal enhancement. The visualized portions of the  paranasal sinuses are clear.      Impression:         Examination within normal limits.    Georgana Curio, MD   05/14/2018 1:05 PM    CTA  Head & Neck [[id number]] Collected:  05/14/18 1031    Order Status:  Completed Updated:  05/14/18 1039    Narrative:       HISTORY: Left upper convexity weakness. Vertigo    TECHNIQUE: Enhanced computed tomography angiography of the head and neck  was performed at 0.65 mm. Maximum intensity projection and surface  shaded reconstruction were performed on the [place] station. Stenosis  estimation is based on NASCET trial criteria. A combination of automatic  exposure control, adjustment of the mA a and/or KVP according to the  patient's size and or use  of iterative reconstruction technique was  utilized.    PRIORS: There are no prior studies for comparison.    FINDINGS:   There is no aneurysmal dilatation or stenosis involving the cavernous  and supraclinoid portions of the internal carotid arteries. The  bilateral anterior and middle cerebral arteries are unremarkable.  Minimal, cavernous internal carotid artery calcifications are  present.  Evaluation of the posterior circulation show the intradural portion of  the vertebral arteries to be normal as is the basilar and posterior  cerebral arteries.         The aortic arch in unremarkable. The bilateral common carotid  arteries are open. The bilateral bifurcations are patents. There are  minimal, atherosclerotic calcifications involving the origins of the  bilateral internal carotid arteries. The right and left internal carotid  arteries are normal in caliber.  Evaluation of the posterior circulation shows the vertebral arteries to  be normal in caliber and course from their origins to the  vertebrobasilar junction.   There are multilevel degenerative changes of the cervical spine  resulting in central canal or neuroforamina stenosis.        Impression:        Vascular atherosclerotic changes. No evidence of  hemodynamically significant stenosis. No large vessel occlusion.        Georgana Curio, MD   05/14/2018 10:35 AM    CT Perfusion Brain [[id number]] Collected:  05/14/18 1019    Order Status:  Completed Updated:  05/14/18 1024    Narrative:       History: Left upper extremity weakness and vertigo.    TECHNIQUE:  Computed tomography of the brain with single 1 cm slice over  the basal ganglia region was obtained after the intra venous  administration of 40 cc at 5 cc/sec. TTP, CBV and CBF maps were  generated. A combination of automatic exposure control, adjustment of  the mA a and/or KVP according to the patient's size and or use of  iterative reconstruction technique was utilized.      FINDINGS:    The TTP images show no evidence of delayed transit time.  The CBV and CBF images show no findings to suggest an ischemic process.      Impression:        Examination within normal limits.    Georgana Curio, MD   05/14/2018 10:20 AM    CT Head without Contrast [[id number]] Collected:  05/14/18 1001    Order Status:  Completed Updated:  05/14/18 1008    Narrative:       INDICATION:  left sided  weakness    TECHNIQUE: Axial CT scans of the head was performed from the skull base  to the vertex without IV contrast.  A combination of automatic exposure  control and adjustment  of the mA and/or kV according to patient size  was utilized.    COMPARISON: April 03, 2018.    FINDINGS: There is no midline shift or acute hemorrhage.  There is  prominence of the ventricles and sulci, likely reflecting age related  cortical involutional changes.  Scattered foci of hypoattenuation within  the periventricular and subcortical white matter bilaterally is  nonspecific, but most commonly represents chronic small vessel ischemic  changes. There is no evidence of acute large territorial infarction.  There is no extra-axial collection.    Visualized paranasal sinuses, mastoid and ethmoid air cells are clear.   No focal bony abnormality is seen.      Impression:  No evidence of acute intracranial abnormality.  Other  findings as above.    Gustavus Messing, MD   05/14/2018 10:04 AM           All recent brain and spine imaging (MRI, CT) personally reviewed.    Chart reviewed    Case discussed with: pt       This note was generated by the Epic EMR system/Speech recognition and may contain inherent errors or omissions not intended by the user. Grammatical errors, random word insertions, deletions and pronoun errors  are occasional consequences of this technology due to software limitations.   Not all errors are caught or corrected. If there are questions or concerns about the content of this note or information contained within the body of this dictation they should be addressed directly with the author for clarification.    Signed by: Cathe Mons, MD  Spectralink: [phone]      Answering Service: [phone]

## 2018-05-14 NOTE — Stroke Progress Note (Addendum)
[facility]  Stroke Team Evaluation     Code called on:   Date: 05/14/2018  Time Code Called: 09:46    72 y.o. female presenting with LLE weakness and sensory problems with a LKW of 02:00.    Last known well: 8/12 02:00  Initial NIH Stroke Scale: 4    Prior stroke: no  Patient on anticoagulation: no  Pre-stroke mRS: 0  Left or right handed: Right  List cardiovascular risk factors (htn, hld, dm, a-fib, tobacco, etc.): R breast CA 2015; HTN, HLD    Discussed with Dr. Nonda Lou and not an alteplase candidate based upon LKW > 4.5 hours ago. Will notify CVIR of possible thrombectomy candidate. Delay with imaging d/t IV access.    Nazia Rhines, RN  05/14/2018 10:00 AM    [facility] ED Stroke RN / [phone]  [facility] Inpatient Stroke RN / [phone]      *Modified Rankin Scale:  0 - No symptoms at all   1 - No significant disability despite symptoms; able to carry out all usual duties and activities  2 - Slight disability; unable to carry out all previous activities, but able to look after own affairs without assistance  3 - Moderate disability; requiring some help, but able to walk without assistance  4 - Moderately severe disability; unable to walk without assistance and unable to attend to own bodily needs without assistance  5 - Severe disability; bedridden, incontinent and requiring constant nursing care and attention  6 - Dead

## 2018-05-14 NOTE — ED Notes (Signed)
Bed: E27  Expected date:   Expected time:   Means of arrival:   Comments:

## 2018-05-14 NOTE — ED Notes (Signed)
Bed: E07  Expected date:   Expected time:   Means of arrival:   Comments:  TRIAGE

## 2018-05-14 NOTE — EDIE (Signed)
COLLECTIVE?NOTIFICATION?05/14/2018 09:37?Teairra, Millar B?MRN: [id number]    [place] [facility]'s patient encounter information:   WNU:[id number]  Account [id number]  Billing Account [id number]      Criteria Met      5 ED Visits in 12 Months    Security and Safety  No recent Security Events currently on file    ED Care Guidelines  There are currently no ED Care Guidelines for this patient. Please check your facility's medical records system.      Prescription Monitoring Program  PDMP query found no report.  Narx Score not available at this time.      E.D. Visit Count (12 mo.)  Facility Visits   [place] [facility] 6   Total 6   Note: Visits indicate total known visits.      Recent Emergency Department Visit Summary  Date Facility [facility] Type Diagnoses or Chief Complaint   May 14, 2018 [place] [facility] [place] [facility]      dizziness, sycope      Apr 03, 2018 [place] [facility] [place] [facility]      medic      Generalized weakness      Nausea      Dizziness and giddiness      Unspecified nystagmus      Weakness      Feb 07, 2018 [place] [facility] [place] [facility]      Triage-Weakness      Generalized Body Aches      Acute bronchitis, unspecified      Sep 13, 2017 [place] [facility] [place] [facility]      body aches      Nausea      Generalized Body Aches      Dizziness      Chest pain, unspecified      Essential (primary) hypertension      Myalgia, unspecified site      Paresthesia of skin      Jul 10, 2017 [place] [facility] [place] [facility] [place] [facility]      Dizziness      Numbness      Chest Pain      Other chest pain      Other chronic pain      May 26, 2017 [place] [facility] [place] [facility]      triage- left sided numbness, extremity weakness      Tingling          Recent Inpatient Visit Summary  Date Facility [facility] Type Diagnoses or Chief Complaint   Apr 03, 2018  [facility] [place] [facility]      Unspecified  nystagmus      Dizziness and giddiness      Weakness      Nausea          Care Team  There are no care providers on record at this time.   Collective Portal  This patient has registered at the [facility] Emergency Department   For more information visit: [email or web address]     PLEASE NOTE:    1.   Any care recommendations and other clinical information are provided as guidelines or for historical purposes only, and providers should exercise their own clinical judgment when providing care.    2.   You may only use this information for purposes of treatment, payment or health  care operations activities, and subject to the limitations of applicable [employer] Policies.    3.   You should consult directly with the organization that provided a care guideline or other clinical history with any questions about additional information or accuracy or completeness of information provided.    ? 2019 [employer] - [email or web address]

## 2018-05-14 NOTE — ED Provider Notes (Signed)
EMERGENCY DEPARTMENT NOTE    Physician/Midlevel provider first contact with patient: 05/14/18 0944         HISTORY OF PRESENT ILLNESS   Historian:Patient  Translator Used: no    Chief Complaint: Extremity Weakness and Dizziness     72 y.o. female presents to the emergency department after awakening at 2 AM and attempting to get out of bed noticing that her left leg was weak and she had vertigo.  Symptoms continued so she came into the emergency department.  Left leg continues to be weak with mild left arm weakness.  Vertigo has resolved.  No headache.  No other numbness weakness tingling.  No fever chills.      1. Location of symptoms: Left arm and leg  2. Onset of symptoms: Last known normal 2am  3. What was patient doing when symptoms started (Context): see above  4. Severity: moderate  5. Timing: Constant  6. Activities that worsen symptoms: None  7. Activities that improve symptoms: None  8. Quality: No pain  9. Radiation of symptoms: no  10. Associated signs and Symptoms: see above  11. Are symptoms worsening? yes  MEDICAL HISTORY     Past Medical History:  Past Medical History:   Diagnosis Date   . Breast cancer 2014    right breast mastectomy   . Hyperlipidemia    . Hypertension    . Malignant neoplasm of overlapping sites of right female breast 10/19/2015       Past Surgical History:  Past Surgical History:   Procedure Laterality Date   . HYSTERECTOMY     . MASTECTOMY Right 2015       Social History:  Social History     Social History   . Marital status: Widowed     Spouse name: N/A   . Number of children: N/A   . Years of education: N/A     Occupational History   . Not on file.     Social History Main Topics   . Smoking status: Never Smoker   . Smokeless tobacco: Never Used   . Alcohol use 0.6 oz/week     1 Cans of beer per week      Comment: socially   . Drug use: No   . Sexual activity: Not on file     Other Topics Concern   . Not on file     Social History Narrative   . No narrative on file       Family  History:  Family History   Problem Relation Age of Onset   . Breast cancer Neg Hx        Outpatient Medication:  Previous Medications    ACETAMINOPHEN (TYLENOL) 500 MG TABLET    Take 500 mg by mouth as needed for Pain.    AMLODIPINE (NORVASC) 5 MG TABLET    Take 1 tablet (5 mg total) by mouth daily    ANASTROZOLE (ARIMIDEX) 1 MG TABLET    Take 1 tablet (1 mg total) by mouth daily    ASPIRIN 81 MG EC TABLET    Take 81 mg by mouth daily.    IBUPROFEN (ADVIL,MOTRIN) 600 MG TABLET    Take 1 tablet (600 mg total) by mouth every 6 (six) hours as needed for Pain.for up to 30 doses    LEVOTHYROXINE (SYNTHROID, LEVOTHROID) 25 MCG TABLET    Take 25 mcg by mouth every morning.        LISINOPRIL (PRINIVIL,ZESTRIL) 20 MG TABLET  Take 1 tablet (20 mg total) by mouth 2 (two) times daily.    MECLIZINE (ANTIVERT) 25 MG TABLET    Take 1 tablet (25 mg total) by mouth 3 (three) times daily as needed for Dizziness    MULTIPLE VITAMINS-MINERALS (MULTIVITAMIN WITH MINERALS) TABLET    Take 1 tablet by mouth daily.    OMEPRAZOLE (PRILOSEC) 10 MG CAPSULE    Take 2 capsules (20 mg total) by mouth daily.    ROSUVASTATIN (CRESTOR) 40 MG TABLET    Take 1 tablet (40 mg total) by mouth daily    VITAMIN B-1 (THIAMINE) 100 MG TABLET    Take 100 mg by mouth every morning.         REVIEW OF SYSTEMS   Review of Systems   Constitutional: Negative for chills and fever.   Respiratory: Negative for cough.    Cardiovascular: Negative for chest pain and leg swelling.   Gastrointestinal: Negative for abdominal pain, diarrhea, nausea and vomiting.   Musculoskeletal: Negative for back pain and neck pain.   Neurological: Positive for dizziness and focal weakness. Negative for loss of consciousness and headaches.   All other systems reviewed and are negative.    PHYSICAL EXAM     ED Triage Vitals [05/14/18 0941]   Enc Vitals Group      BP 171/80      Heart Rate 75      Resp Rate 16      Temp 98 F (36.7 C)      Temp Source Oral      SpO2 98 %      Weight 76 kg       Height       Head Circumference       Peak Flow       Pain Score       Pain Loc       Pain Edu?       Excl. in GC?    Nursing note and vitals reviewed.  Constitutional:  Well developed, well nourished.  Awake & alert.    Head:  Atraumatic.  Normocephalic.    Eyes:  PERRL.  EOMI.  Conjunctivae are not pale.  ENT:  Mucous membranes are moist and intact.  Oropharynx is clear and symmetric.  Patent airway.  Neck:  Supple.  Full ROM.  No JVD.  No lymphadenopathy. No carotid bruit bilaterally  Cardiovascular:  Regular rate.  Regular rhythm.   Pulmonary/Chest:  No evidence of respiratory distress.  Clear to auscultation bilaterally.  No wheezing, rales or rhonchi. Chest non-tender.  Abdominal:  Soft and non-distended.  There is no tenderness.  No rebound, guarding, or rigidity.    Back:  No CVA tenderness. FROM.   Extremities:  No edema.   No cyanosis.  No clubbing.  Full range of motion in all extremities.  No calf tenderness.  Skin:  Skin is warm and dry.  No diaphoresis.  Neurological: Right upper extremity and lower extremity motor intact.  Left leg with minimal movement no effort against gravity.  Left arm with mild drift on exam.  Cranial nerves II through XII intact.  Speech intact.  Psychiatric:  Good eye contact.  Normal interaction, affect, and behavior    MEDICAL DECISION MAKING     DISCUSSION    CT head rule out intracranial hemorrhage or CVA negative for acute process.  tPA considered but given last known normal over 4.5 hours prior to arrival not indicated.  CT angiogram perfusion rule out  large vessel occlusion negative for acute process.  Symptoms improving in the emergency department.  Will admit for further evaluation rule out stroke.  MRI of the brain ordered rule out stroke.  Dr. Francesco Sor neurology consulted.  Dr. Georgetta Haber by accepts to his service for further care.     NIH Stroke Score      Most Recent Value   Patient's calculated Stroke Score:  4 filed at 05/14/2018 0956          Vital Signs:  Reviewed the patient?s vital signs.   Nursing Notes: Reviewed and utilized available nursing notes.  Medical Records Reviewed: Reviewed available past medical records.  Counseling: The emergency provider has spoken with the patient and discussed today?s findings, in addition to providing specific details for the plan of care.  Questions are answered and there is agreement with the plan.      CARDIAC STUDIES    The following cardiac studies were independently interpreted by the Emergency Medicine Physician.  For full cardiac study results please see chart.    Monitor Strip  Interpreted by ED Physician  Rate: 70  Rhythm: NSR   ST Changes: none    EKG Interpretation:  Signed and interpreted by ED Provider   Time Interpreted: [phone]  Rate: 71  Rhythm: NSR  Axis: normal  Intervals: normal  Blocks:none  ST segments: no acute changes  Interpretation: Normal  EKG      RADIOLOGY IMAGING STUDIES      CTA  Head & Neck   Final Result    Vascular atherosclerotic changes. No evidence of   hemodynamically significant stenosis. No large vessel occlusion.          Georgana Curio, MD    05/14/2018 10:35 AM      CT Perfusion Brain   Final Result    Examination within normal limits.      Georgana Curio, MD    05/14/2018 10:20 AM      CT Head without Contrast   Final Result     No evidence of acute intracranial abnormality.  Other   findings as above.      Gustavus Messing, MD    05/14/2018 10:04 AM      MRI Brain with/without Contrast    (Results Pending)         PULSE OXIMETRY    Oxygen Saturation by Pulse Oximetry: 98%  Interventions: none  Interpretation:  normal    EMERGENCY DEPT. MEDICATIONS      ED Medication Orders     Start Ordered     Status Ordering Provider    05/14/18 1128 05/14/18 1127  aspirin tablet 325 mg  Daily     Route: Oral  Ordered Dose: 325 mg     Acknowledged Tanaka Gillen D          LABORATORY RESULTS    Ordered and independently interpreted AVAILABLE laboratory tests. Please see results section in chart for full  details.  Results for orders placed or performed during the hospital encounter of 05/14/18   CBC and differential   Result Value Ref Range    WBC 7.48 3.10 - 9.50 x10 3/uL    Hgb 9.9 (L) 11.4 - 14.8 g/dL    Hematocrit 16.6 (L) 34.7 - 43.7 %    Platelets 272 142 - 346 x10 3/uL    RBC 3.68 (L) 3.90 - 5.10 x10 6/uL    MCV 89.1 78.0 - 96.0 fL    MCH 26.9 25.1 -  33.5 pg    MCHC 30.2 (L) 31.5 - 35.8 g/dL    RDW 14 11 - 15 %    MPV 10.8 8.9 - 12.5 fL    Neutrophils 59.4 None %    Lymphocytes Automated 30.9 None %    Monocytes 8.4 None %    Eosinophils Automated 0.7 None %    Basophils Automated 0.3 None %    Immature Granulocyte 0.3 None %    Nucleated RBC 0.0 0.0 - 0.0 /100 WBC    Neutrophils Absolute 4.45 1.10 - 6.33 x10 3/uL    Abs Lymph Automated 2.31 0.42 - 3.22 x10 3/uL    Abs Mono Automated 0.63 0.21 - 0.85 x10 3/uL    Abs Eos Automated 0.05 0.00 - 0.44 x10 3/uL    Absolute Baso Automated 0.02 0.00 - 0.08 x10 3/uL    Absolute Immature Granulocyte 0.02 0.00 - 0.07 x10 3/uL    Absolute NRBC 0.00 0.00 - 0.00 x10 3/uL   Comprehensive metabolic panel   Result Value Ref Range    Glucose 102 (H) 70 - 100 mg/dL    BUN 13 7 - 19 mg/dL    Creatinine 0.9 0.6 - 1.0 mg/dL    Sodium 962 952 - 841 mEq/L    Potassium 3.8 3.5 - 5.1 mEq/L    Chloride 104 100 - 111 mEq/L    CO2 32 (H) 22 - 29 mEq/L    Calcium 8.5 7.9 - 10.2 mg/dL    Protein, Total 5.3 (L) 6.0 - 8.3 g/dL    Albumin 3.2 (L) 3.5 - 5.0 g/dL    AST (SGOT) 16 5 - 34 U/L    ALT 15 0 - 55 U/L    Alkaline Phosphatase 83 37 - 106 U/L    Bilirubin, Total 0.3 0.2 - 1.2 mg/dL    Globulin 2.1 2.0 - 3.6 g/dL    Albumin/Globulin Ratio 1.5 0.9 - 2.2    Anion Gap 6.0 5.0 - 15.0   Protime - INR   Result Value Ref Range    PT 13.5 12.6 - 15.0 sec    PT INR 1.0 0.9 - 1.1   APTT   Result Value Ref Range    PTT 32 23 - 37 sec   Troponin I   Result Value Ref Range    Troponin I <0.01 0.00 - 0.09 ng/mL   GFR   Result Value Ref Range    EGFR >60.0    Glucose Whole Blood - POCT   Result Value  Ref Range    POCT - Glucose Whole blood 92 70 - 100 mg/dL   ECG 12 lead   Result Value Ref Range    Ventricular Rate 71 BPM    Atrial Rate 71 BPM    P-R Interval 164 ms    QRS Duration 92 ms    Q-T Interval 376 ms    QTC Calculation (Bezet) 408 ms    P Axis 48 degrees    R Axis 17 degrees    T Axis -2 degrees       CRITICAL CARE/PROCEDURES    Procedures    DIAGNOSIS      Diagnosis:  Final diagnoses:   Left-sided weakness       Disposition:  ED Disposition     ED Disposition Condition Date/Time Comment    Observation  Mon May 14, 2018 11:27 AM Admitting Physician: Carley Hammed [[id number]]   Diagnosis: Left-sided weakness [102725]   Estimated Length of  Stay: < 2 midnights   Tentative Discharge Plan?: Home or Self Care [1]   Patient Class: Observation [104]            Prescriptions:  Patient's Medications   New Prescriptions    No medications on file   Previous Medications    ACETAMINOPHEN (TYLENOL) 500 MG TABLET    Take 500 mg by mouth as needed for Pain.    AMLODIPINE (NORVASC) 5 MG TABLET    Take 1 tablet (5 mg total) by mouth daily    ANASTROZOLE (ARIMIDEX) 1 MG TABLET    Take 1 tablet (1 mg total) by mouth daily    ASPIRIN 81 MG EC TABLET    Take 81 mg by mouth daily.    IBUPROFEN (ADVIL,MOTRIN) 600 MG TABLET    Take 1 tablet (600 mg total) by mouth every 6 (six) hours as needed for Pain.for up to 30 doses    LEVOTHYROXINE (SYNTHROID, LEVOTHROID) 25 MCG TABLET    Take 25 mcg by mouth every morning.        LISINOPRIL (PRINIVIL,ZESTRIL) 20 MG TABLET    Take 1 tablet (20 mg total) by mouth 2 (two) times daily.    MECLIZINE (ANTIVERT) 25 MG TABLET    Take 1 tablet (25 mg total) by mouth 3 (three) times daily as needed for Dizziness    MULTIPLE VITAMINS-MINERALS (MULTIVITAMIN WITH MINERALS) TABLET    Take 1 tablet by mouth daily.    OMEPRAZOLE (PRILOSEC) 10 MG CAPSULE    Take 2 capsules (20 mg total) by mouth daily.    ROSUVASTATIN (CRESTOR) 40 MG TABLET    Take 1 tablet (40 mg total) by mouth daily    VITAMIN B-1  (THIAMINE) 100 MG TABLET    Take 100 mg by mouth every morning.   Modified Medications    No medications on file   Discontinued Medications    No medications on file       This note was generated by the Epic EMR system/ Dragon speech recognition and may contain inherent errors or omissions not intended by the user. Grammatical errors, random word insertions, deletions and pronoun errors are occasional consequences of this technology due to software limitations. Not all errors are caught or corrected. If there are questions or concerns about the content of this note or information contained within the body of this dictation they should be addressed directly with the author for clarification.     Shela Nevin, MD  05/14/18 1154

## 2018-05-14 NOTE — Stroke Progress Note (Signed)
Called [facility] while pt on the table in CT and Dr. Terrial Rhodes aware. PTS at the bedside upon coming back from CT.

## 2018-05-14 NOTE — Progress Notes (Signed)
Pt admitted from ED via stretcher. Alert and oriented x 4. VSS. Afebrile. Pt oriented to room and unit. Call light within reach. Plan of care/ hourly rounding explained. "My Hospital stay folder "explained and given to patient. Pt able to teach back . Continue monitoring.

## 2018-05-14 NOTE — ED Notes (Signed)
PTS called for transfer to [place], ETA 15 min

## 2018-05-14 NOTE — ED Notes (Signed)
Bed: E26  Expected date:   Expected time:   Means of arrival:   Comments:

## 2018-05-14 NOTE — ED Notes (Signed)
Bed: E08  Expected date: 05/14/18  Expected time:   Means of arrival:   Comments:  Stroke Pt

## 2018-05-14 NOTE — Plan of Care (Addendum)
Problem: Safety  Goal: Patient will be free from injury during hospitalization  Outcome: Progressing  Fall precaution bundle in place. Bed alarm on functioning.Bed in lowest position and locked. Pt advised to call for assistance at all times. He verbalized understanding. Call light within reach. Safe environment, non-skid footwear, purposeful hourly rounding  Provided. Pt ambulated to BR stand by guard. Safety maintained.   05/14/18 2334   Goal/Interventions addressed this shift   Patient will be free from injury during hospitalization  Assess patient's risk for falls and implement fall prevention plan of care per policy;Provide and maintain safe environment;Use appropriate transfer methods;Ensure appropriate safety devices are available at the bedside;Include patient/ family/ care giver in decisions related to safety;Hourly rounding       Problem: Pain  Goal: Pain at adequate level as identified by patient  Outcome: Progressing  Tylenol 650 mg given for imtermittent frontal HA with positive effect. Continue monitoring pain  Level and offer pain medications as needed.   05/14/18 2334   Goal/Interventions addressed this shift   Pain at adequate level as identified by patient Identify patient comfort function goal;Assess pain on admission, during daily assessment and/or before any "as needed" intervention(s);Reassess pain within 30-60 minutes of any procedure/intervention, per Pain Assessment, Intervention, Reassessment (AIR) Cycle;Evaluate if patient comfort function goal is met;Offer non-pharmacological pain management interventions;Include patient/patient care companion in decisions related to pain management as needed;Evaluate patient's satisfaction with pain management progress       Problem: Day of Admission - Stroke  Goal: Core/Quality measure requirements - Admission  Outcome: Progressing  Pt admitted with Dx Lt Sided Weakness. Alert and oriented x 4.   05/14/18 2334   Goal/Interventions addressed this shift    Core/Quality measure requirements - Admission Document NIH Stroke Scale on admission;Document nursing swallow/dysphagia screen on admission. If patient fails, keep patient NPO (follow your hospital protocol on swallowing screening).;VTE Prevention: Ensure anticoagulant(s) administered and/or anti-embolism stockings/devices documented as ordered;Ensure antithrombotic administered or contraindication documented by LIP;If diagnosis or history of Atrial Fib/Atrial Flutter, ensure oral anticoagulation is initiated or contraindication documented by LIP;Ensure lipid panel ordered;Begin stroke education on admission (must include Modifiable Risk Factors, Warning Signs and Symptoms of Stroke, Activation of Emergency Medical System and Follow-up Appointments) Ensure handout has been given and documented.;Ensure PT/OT and/or SLP ordered

## 2018-05-14 NOTE — H&P (Signed)
ADMISSION HISTORY AND PHYSICAL EXAM    Date Time: 05/14/18 2:33 PM  Patient Name: Emily [name]  Attending Physician: Shela Nevin, MD  Primary Care Physician: Ramon Dredge, MD    CC: Left sided weakness.       Assessment:     Active Hospital Problems    Diagnosis POA   . Principal Problem: Left-sided weakness Yes   . Hypothyroid Yes   . Mixed hyperlipidemia Yes   . Hypertension Yes   . Malignant neoplasm of overlapping sites of right female breast Yes      Resolved Hospital Problems    Diagnosis POA   No resolved problems to display.           Plan:   Admit to IMG hospitalist service    Left-sided weakness-rule out acute stroke versus TIA  -Stroke protocol  -Permissive hypertension per protocol  -Telemetry monitoring to rule out arrhythmia  -PT/OT assessment pending  -Neuro consult pending  -Continue aspirin/statin  -Repeat A1c and lipids pending    History of hypertension  -Antihypertensives on hold presently.  Monitor for excessive hypertension    History of mixed hyperlipidemia  -Continue rosuvastatin    History of breast cancer  -Continue Arimidex    History of hypothyroidism  -Continue Synthroid    History of Presenting Illness:   Emily [name] is a 72 y.o. female with history of mixed hyperlipidemia, breast cancer, hypothyroidism, GERD, and vertigo presented to the ED with reports of left-sided weakness.  Patient stated she awoke approximately 2 AM and attempted to walk to the bathroom when she found her left side weak and numb.  She was unable to ambulate normally and fell twice to the floor.  She also reports noting blurred vision and strange sensation in her face.  Patient returned to sleep but upon awakening symptoms had not resolved fully prompting her to call EMS.    Initial assessment significant for mild left-sided weakness, facial weakness, and sensory deficit of the left side.  CT imaging negative for large occlusion.  Initial MRI imaging negative for acute infarct.  ECG with no signs of  atrial arrhythmias.    Past Medical History:     Past Medical History:   Diagnosis Date   . Breast cancer 2014    right breast mastectomy   . GERD (gastroesophageal reflux disease)    . Hyperlipidemia    . Hypertension    . Hypothyroidism    . Malignant neoplasm of overlapping sites of right female breast 10/19/2015   . Vertigo        Available old records reviewed, including: [place] Epic records      Past Surgical History:     Past Surgical History:   Procedure Laterality Date   . HYSTERECTOMY     . MASTECTOMY Right 2015       Family History:     Family History   Problem Relation Age of Onset   . Breast cancer Neg Hx        Social History:     History   Smoking Status   . Never Smoker   Smokeless Tobacco   . Never Used     History   Alcohol Use   . 0.6 oz/week   . 1 Cans of beer per week     Comment: socially     History   Drug Use No       Allergies:     Allergies   Allergen Reactions   .  Caffeine      Personal preference   . Morphine Itching   . Morphine And Related Itching and Nausea And Vomiting     dizzy       Medications:     Prior to Admission medications    Medication Sig Start Date End Date Taking? Authorizing Provider   acetaminophen (TYLENOL) 500 MG tablet Take 500 mg by mouth as needed for Pain.   Yes [provider]   amLODIPine (NORVASC) 5 MG tablet Take 1 tablet (5 mg total) by mouth daily  Patient taking differently: Take 5 mg by mouth every morning     03/15/18  Yes Royann Shivers, MD   anastrozole (ARIMIDEX) 1 MG tablet Take 1 tablet (1 mg total) by mouth daily  Patient taking differently: Take 1 mg by mouth every morning     01/19/18  Yes Clent Ridges, MD   aspirin 81 MG EC tablet Take 81 mg by mouth every morning       Yes [provider]   levothyroxine (SYNTHROID, LEVOTHROID) 25 MCG tablet Take 25 mcg by mouth every morning.     12/25/13  Yes [provider]   lisinopril (PRINIVIL,ZESTRIL) 20 MG tablet Take 1 tablet (20 mg total) by mouth 2 (two) times  daily.  Patient taking differently: Take 20 mg by mouth daily     12/13/17  Yes Royann Shivers, MD   meclizine (ANTIVERT) 25 MG tablet Take 1 tablet (25 mg total) by mouth 3 (three) times daily as needed for Dizziness 04/04/18  Yes Arna Medici, MD   Multiple Vitamins-Minerals (MULTIVITAMIN WITH MINERALS) tablet Take 1 tablet by mouth every morning       Yes [provider]   rosuvastatin (CRESTOR) 40 MG tablet Take 40 mg by mouth daily   Yes [provider]   vitamin B-1 (THIAMINE) 100 MG tablet Take 100 mg by mouth every morning.   Yes [provider]       Review of Systems:   Per HPI/PMH and below.     All other systems reviewed and are negative.   Physical Exam:     Patient Vitals for the past 24 hrs:   BP Temp Temp src Pulse Resp SpO2 Weight   05/14/18 1400 174/84 - - 71 20 98 % -   05/14/18 1300 169/77 98.6 F (37 C) Oral (!) 58 18 99 % -   05/14/18 1100 177/82 - - 68 21 98 % -   05/14/18 1030 160/65 - - 86 (!) 31 99 % -   05/14/18 1023 170/75 - - 72 17 98 % -   05/14/18 0941 171/80 98 F (36.7 C) Oral 75 16 98 % 76 kg (167 lb 8.8 oz)     Body mass index is 29.68 kg/m.  No intake or output data in the 24 hours ending 05/14/18 1433  Physical Exam:   General appearance - alert, generally well appearing, AAF.   Mental status - alert, oriented x 4  HEENT- Normocephalic, atraumatic, non-icteric with pink conjunctiva, moist mucous membranes.  Neck - supple   Chest - bilateral breath sounds clear to auscultation. Non labored  Heart -  regular rhythm, normal S1, S2, no murmurs.  Abdomen -slightly obese, soft, nontender, with active bowel sounds.  Neurological -mild weakness of left hand grip and plantar/dorsiflexion of left leg with resistance.  Noted mild sensory deficit of left side no facial weakness noted.  Patient reports blurred vision of  the right eye, facial numbness and right-sided headache.  Musculoskeletal - no joint tenderness or swelling.  Weakness as noted  above  Extremities - peripheral pulses present, no edema.  Skin - warm, dry, with no rashes, lesions or bruising.   Tele/ECG: SR     Labs:     Results     Procedure Component Value Units Date/Time    Troponin I [[id number]] Collected:  05/14/18 1024    Specimen:  Blood Updated:  05/14/18 1118     Troponin I <0.01 ng/mL     Comprehensive metabolic panel [[id number]]  (Abnormal) Collected:  05/14/18 1024    Specimen:  Blood Updated:  05/14/18 1112     Glucose 102 (H) mg/dL      BUN 13 mg/dL      Creatinine 0.9 mg/dL      Sodium 332 mEq/L      Potassium 3.8 mEq/L      Chloride 104 mEq/L      CO2 32 (H) mEq/L      Calcium 8.5 mg/dL      Protein, Total 5.3 (L) g/dL      Albumin 3.2 (L) g/dL      AST (SGOT) 16 U/L      ALT 15 U/L      Alkaline Phosphatase 83 U/L      Bilirubin, Total 0.3 mg/dL      Globulin 2.1 g/dL      Albumin/Globulin Ratio 1.5     Anion Gap 6.0    GFR [[id number]] Collected:  05/14/18 1024     Updated:  05/14/18 1112     EGFR >60.0    APTT [[id number]] Collected:  05/14/18 1024     Updated:  05/14/18 1040     PTT 32 sec     Protime - INR [[id number]] Collected:  05/14/18 1024    Specimen:  Blood Updated:  05/14/18 1039     PT 13.5 sec      PT INR 1.0    CBC and differential [[id number]]  (Abnormal) Collected:  05/14/18 1024    Specimen:  Blood from Blood Updated:  05/14/18 1032     WBC 7.48 x10 3/uL      Hgb 9.9 (L) g/dL      Hematocrit 23.7 (L) %      Platelets 272 x10 3/uL      RBC 3.68 (L) x10 6/uL      MCV 89.1 fL      MCH 26.9 pg      MCHC 30.2 (L) g/dL      RDW 14 %      MPV 10.8 fL      Neutrophils 59.4 %      Lymphocytes Automated 30.9 %      Monocytes 8.4 %      Eosinophils Automated 0.7 %      Basophils Automated 0.3 %      Immature Granulocyte 0.3 %      Nucleated RBC 0.0 /100 WBC      Neutrophils Absolute 4.45 x10 3/uL      Abs Lymph Automated 2.31 x10 3/uL      Abs Mono Automated 0.63 x10 3/uL      Abs Eos Automated 0.05 x10 3/uL      Absolute Baso Automated 0.02 x10 3/uL      Absolute Immature  Granulocyte 0.02 x10 3/uL      Absolute NRBC 0.00 x10 3/uL     Glucose Whole Blood - POCT [  [id number]] Collected:  05/14/18 0946     Updated:  05/14/18 0952     POCT - Glucose Whole blood 92 mg/dL           Imaging personally reviewed, including: Cta  Head & Neck    Result Date: 05/14/2018   Vascular atherosclerotic changes. No evidence of hemodynamically significant stenosis. No large vessel occlusion.    Georgana Curio, MD 05/14/2018 10:35 AM    Ct Head Without Contrast    Result Date: 05/14/2018    No evidence of acute intracranial abnormality.  Other findings as above. Gustavus Messing, MD 05/14/2018 10:04 AM    Ct Perfusion Brain    Result Date: 05/14/2018   Examination within normal limits. Georgana Curio, MD 05/14/2018 10:20 AM    Mri Brain With/without Contrast    Result Date: 05/14/2018    Examination within normal limits. Georgana Curio, MD 05/14/2018 1:05 PM        Safety Checklist  DVT prophylaxis:  CHEST guideline (See page e199S) Chemical   Foley: Not present   IVs:  Peripheral IV   PT/OT: Ordered       Signed by: Lynnell Jude, ANP  [id number], Janet Berlin, MD

## 2018-05-15 LAB — HEMOGLOBIN A1C
Average Estimated Glucose: 125.5 mg/dL
Hemoglobin A1C: 6 % — ABNORMAL HIGH (ref 4.6–5.9)

## 2018-05-15 LAB — URINALYSIS REFLEX TO MICROSCOPIC EXAM - REFLEX TO CULTURE
Bilirubin, UA: NEGATIVE
Blood, UA: NEGATIVE
Glucose, UA: NEGATIVE
Ketones UA: NEGATIVE
Leukocyte Esterase, UA: NEGATIVE
Nitrite, UA: NEGATIVE
Protein, UR: NEGATIVE
Specific Gravity UA: 1.012 (ref 1.001–1.035)
Urine pH: 6 (ref 5.0–8.0)
Urobilinogen, UA: NEGATIVE mg/dL

## 2018-05-15 LAB — LIPID PANEL
Cholesterol / HDL Ratio: 3.2
Cholesterol: 189 mg/dL (ref 0–199)
HDL: 59 mg/dL (ref 40–9999)
LDL Calculated: 117 mg/dL — ABNORMAL HIGH (ref 0–99)
Triglycerides: 65 mg/dL (ref 34–149)
VLDL Calculated: 13 mg/dL (ref 10–40)

## 2018-05-15 LAB — HEMOLYSIS INDEX: Hemolysis Index: 13 (ref 0–18)

## 2018-05-15 MED ORDER — LISINOPRIL 20 MG PO TABS
20.00 mg | ORAL_TABLET | Freq: Every day | ORAL | Status: DC
Start: 2018-05-15 — End: 2018-09-13

## 2018-05-15 MED ORDER — ASPIRIN 81 MG PO CHEW
81.00 mg | CHEWABLE_TABLET | Freq: Every day | ORAL | Status: DC
Start: 2018-05-15 — End: 2018-05-15
  Administered 2018-05-15: 81 mg via ORAL
  Filled 2018-05-15: qty 1

## 2018-05-15 NOTE — Discharge Instr - AVS First Page (Signed)
Reason for your Hospital Admission:  -stroke like symptoms.       Instructions for after your discharge:    -Take all medications as listed.   -Download the "GOOD Rx" app as shown to you. Use the coupon to get you a discount on the Crestor 40mg  (rosuvastatin) ordered by Dr. Franchot Erichsen for your cholesterol.   -Make appointment to see Dr. Francesco Sor in aprox 3 weeks.

## 2018-05-15 NOTE — Progress Notes (Signed)
Pt discharged home in stable condition. MRI was negative for CVA, ambulating independently with no deficits noted. Symptoms resolved at this time. Consuming 100 % meals. Went over discharge orders including follow-up appiontments and stated understanding. Escorted to lobby by staff and stated her car was in the parking lot and will be driving herself home. Denies ppain or discomfort.

## 2018-05-15 NOTE — Consults (Signed)
Emily [name] MRN: [id number]  72 y.o.  female    NUTRITION:  Reason for assessment:  Consult, pt lost >3#.  Pt reports usually small appetite.  Works the 2nd shift and often eats just one meal per day.    Estimates 4# wt loss in the past few weeks.  Per ct review of wt hx; 3% wt decrease in the past 3 months.  No significant weight changes noted.    Assessment   Past Medical History:   Diagnosis Date   . Breast cancer 2014    right breast mastectomy   . GERD (gastroesophageal reflux disease)    . Hyperlipidemia    . Hypertension    . Hypothyroidism    . Malignant neoplasm of overlapping sites of right female breast 10/19/2015   . Vertigo      Wt Readings from Last 30 Encounters:   05/14/18 73.3 kg (161 lb 8 oz)   05/09/18 74.4 kg (164 lb)   04/03/18 74.1 kg (163 lb 4.8 oz)   03/15/18 73.9 kg (163 lb)   02/07/18 75.6 kg (166 lb 10.7 oz)   01/19/18 73.5 kg (162 lb)   12/13/17 76.1 kg (167 lb 12.8 oz)   09/25/17 76.2 kg (168 lb)   09/13/17 75.1 kg (165 lb 9.1 oz)   07/10/17 75.4 kg (166 lb 3.6 oz)   06/23/17 76 kg (167 lb 9.6 oz)   05/27/17 74 kg (163 lb 3.2 oz)   05/26/17 73.5 kg (162 lb)   05/26/17 73.5 kg (162 lb)   01/18/17 73 kg (160 lb 15 oz)   11/25/16 73 kg (161 lb)   09/28/16 75.8 kg (167 lb)   09/12/16 75 kg (165 lb 5.5 oz)   08/09/16 75.3 kg (166 lb)   07/24/16 77.2 kg (170 lb 3.1 oz)   04/23/16 74.8 kg (165 lb)   02/25/16 76.7 kg (169 lb)   02/04/16 74 kg (163 lb 3.2 oz)   10/19/15 76.7 kg (169 lb)   09/06/15 79.4 kg (175 lb)   08/11/15 75.3 kg (166 lb)   07/03/15 77.1 kg (170 lb)   12/17/14 76.2 kg (168 lb)   11/27/14 75.3 kg (166 lb)   04/17/14 77.1 kg (170 lb)     Social History     Social History   . Marital status: Widowed     Spouse name: N/A   . Number of children: N/A   . Years of education: N/A     Occupational History   . Not on file.     Social History Main Topics   . Smoking status: Never Smoker   . Smokeless tobacco: Never Used   . Alcohol use 0.6 oz/week     1 Cans of beer per week      Comment:  socially   . Drug use: No   . Sexual activity: Not on file     Other Topics Concern   . Not on file     Social History Narrative   . No narrative on file       Active Hospital Problems    Diagnosis   . Left-sided weakness   . Hypothyroid   . Mixed hyperlipidemia   . Hypertension   . Malignant neoplasm of overlapping sites of right female breast     Allergies   Allergen Reactions   . Caffeine      Personal preference   . Morphine Itching   . Morphine And Related Itching and  Nausea And Vomiting     dizzy     GI Symptoms:  Active bowel sounds per flowsheets  Skin: Intact    Orders Placed This Encounter   Procedures   . Diet cardiac        Current Meds:    anastrozole 1 mg Daily   enoxaparin 40 mg Daily   levothyroxine 25 mcg QAM   rosuvastatin 40 mg Daily         Recent Labs:    Recent Labs  Lab 05/14/18  1024   WBC 7.48   Hgb 9.9*   Hematocrit 32.8*   MCV 89.1   Platelets 272       Recent Labs  Lab 05/14/18  1024   Sodium 142   Potassium 3.8   Chloride 104   CO2 32*   BUN 13   Creatinine 0.9   Glucose 102*   Calcium 8.5   EGFR >60.0       Recent Labs  Lab 05/14/18  1024   Albumin 3.2*       Intake/Output Summary (Last 24 hours) at 05/15/18 1038  Last data filed at 05/15/18 0600   Gross per 24 hour   Intake              240 ml   Output                0 ml   Net              240 ml       Current Diet Order      Diet cardiac    Anthropometrics  Height: 160 cm (5\' 3" )  Weight: 73.3 kg (161 lb 8 oz)  Weight Change: -3.61  IBW/kg (Calculated) Female: 56.37 kg  IBW/kg (Calculated) Female: 52.27 kg  BMI (calculated): 28.7    Estimated Nutrition Needs:  Estimated Energy Needs  Total Energy Estimated Needs: 1454-1575 calories  Method for Estimating Needs: MSJ 1.2-1.3    Estimated Protein Needs  Total Protein Estimated Needs: 73-88g  Method for Estimating Needs: 1.0-1.2g/kg    Estimated Carbohydrate Needs  Total Carbohydrate Estimated Needs: 182-197g  Method for Estimating Needs: 50% total calories    Fluid Needs  Total Fluid  Estimated Needs: 1454-1558mL or per MD  Method for Estimating Needs: 81mL/kcal or per MD    Learning & Discharge Planning Needs: None at this time  Religious/Cultural Food Practices: None noted    Nutrition Diagnosis:     Inadequate oral intake related to decreased appetite as evidenced by meal intake less than 50% and slight wt loss of 3% in the past 3 months.    Intervention:  Will continue cardiac diet in place.  Add Ensure Enlive once daily for additional nutrition source.  Encourage po intake, honor food preferences as able.    Goals:  Meal intake of 75% or more through LOS.    M/E:  Monitor po intake, tolerance.  Monitor wt changes, fluid status and lab trends.  Follow within 5-7 days.    Darcella Cheshire, RD

## 2018-05-15 NOTE — UM Notes (Signed)
Emily [name] 72 y.o female presents to ED after wakening at 2 AM and attempting to get out of of bed when she noted her left leg is weak and has vertigo.Left leg continued to be weak with mild left arm weakness. Has h/o hyperlipidemia,HTN, rt breast CA.    Dx: left sided weakness    05/14/18 0941  98 F (36.7 C)  --  75  16  171/80  98 %  --  76 kg (167 lb 8.8 oz)     CT head    IMPRESSION:     No evidence of acute intracranial abnormality.      CT Brain    IMPRESSION:    Examination within normal limits.    CTA     IMPRESSION:    Vascular atherosclerotic changes. No evidence of  hemodynamically significant stenosis. No large vessel occlusion    Place in Observation Services (Order [id number])   Admission   Date: 05/14/2018@ 1127 W left sided weakness Department: [facility] Ordering/Authorizing: Shela Nevin, MD     Stroke protocol  -Permissive hypertension per protocol  -Telemetry monitoring to rule out arrhythmia  -PT/OT assessment pending  -Neuro consult pending  -Continue aspirin/statin  -Repeat A1c and lipids pending    Neurology consult:    Patient is a 72 year old lady, who says she woke up in the middle of the night and felt tingling and numbness, on the left face arm and leg, her symptoms remain persistent when she woke up again, after going back to sleep because her symptoms remain persistent she decided to come to the emergency room, her symptoms at this time has resolved she has already undergone a CT angios CT perfusion of the head and neck with a negative brain MRI is complete  And  is  with no acute infarct.        MRI  Impression:         Vascular atherosclerotic changes. No evidence of  hemodynamically significant stenosis. No large vessel occlusion.

## 2018-05-15 NOTE — Progress Notes (Signed)
Date Time: 05/15/18 11:38 AM  Patient Name: Emily [name]  Attending Physician: Carley Hammed, MD  Patient Class: Observation  Hospital Day: 0            NEUROLOGY PROGRESS NOTE             Assessment/Plan   Left face arm and leg yes numbness weakness currently resolved except for minimal paresthesias in the lateral part of her tongue?  Cause brain MRI is negative      Aspirin statin  Outpatient follow-up phone number provided  Patient to see Dr. Derrill Center  regarding her breast cancer she has an appointment later this month  Okay to discharge, from my standpoint      Subjective:   Patient Seen and Examined.  Feels much better, states she has some tingling in the tip of her tongue  Review of Systems:   No headache, eye, ear nose, throat problems; no coughing or wheezing or shortness of breath, No chest pain or orthopnea, no abdominal pain, nausea or vomiting, No pain in the body or extremities, no psychiatric, neurological, endocrine, hematological or cardiac complaints except as noted above.     Physical Exam:   BP 162/70   Pulse 65   Temp 97.7 F (36.5 C) (Oral)   Resp 18   Ht 1.6 m (5\' 3" )   Wt 73.3 kg (161 lb 8 oz)   SpO2 100%   BMI 28.61 kg/m   Awake alert oriented x3 mild facial asymmetry l tongue midline,   No drift good strength in the arms and legs, light touch temperature intact bilaterally in the face arm in the legs today, deep tendon reflexes are 1+    Meds:      Scheduled Meds: PRN Meds:      anastrozole 1 mg Oral Daily   enoxaparin 40 mg Subcutaneous Daily   levothyroxine 25 mcg Oral QAM   rosuvastatin 40 mg Oral Daily       Continuous Infusions:     acetaminophen 650 mg Q4H PRN   Or     acetaminophen 650 mg Q4H PRN   naloxone 0.2 mg PRN   ondansetron 4 mg Q6H PRN   Or     ondansetron 4 mg Q6H PRN               Labs:     Recent Labs  Lab 05/14/18  1024   Glucose 102*   BUN 13   Creatinine 0.9   Calcium 8.5   Sodium 142   Potassium 3.8   Chloride 104   CO2 32*   Albumin 3.2*   AST (SGOT) 16    ALT 15   Bilirubin, Total 0.3   Alkaline Phosphatase 83       Recent Labs  Lab 05/14/18  1024   WBC 7.48   Hgb 9.9*   Hematocrit 32.8*   MCV 89.1   MCH 26.9   MCHC 30.2*   Platelets 272         Recent Labs      05/14/18   1024   PTT  32   PT  13.5   PT INR  1.0          Radiology Results (24 Hour)     Procedure Component Value Units Date/Time    MRI Brain with/without Contrast [[id number]] Collected:  05/14/18 1259    Order Status:  Completed Updated:  05/14/18 1309    Narrative:       HISTORY:  Transient ischemic attack. CVA.    TECHNIQUE: Nonenhanced followed by enhanced  MRI of the brain was  performed utilizing the following pulse sequences:  sagittal and axial  T1-weighted , T2-weighted axial, FLAIR axial, axial gradients, and  diffusion weighted axial images. 7.5 cc of gadavist were given  intravenously.    PRIORS: 04/03/2018.    FINDINGS:         The ventricular system is normal in size and contour.   There is no acute intracranial hemorrhage. There is no herniation.   There are no extra-axial fluid collections.   There are no concerning areas of abnormal signal intensity.   The diffusion sequences show no restriction to suggest an acute infarct.    The cerebellar tonsils are normally positioned.   There was no abnormal enhancement. The visualized portions of the  paranasal sinuses are clear.      Impression:         Examination within normal limits.    Georgana Curio, MD   05/14/2018 1:05 PM           All brain imaging (MRI, CT) personally reviewed.    Case discussed with: pt and susan peetros       This note was generated by the Epic EMR system/ Dragon speech recognition and may contain inherent errors or omissions not intended by the user. Grammatical errors, random word insertions, deletions and pronoun errors  are occasional consequences of this technology due to software limitations.     Not all errors are caught or corrected. If there are questions or concerns about the content of this note or  information contained within the body of this dictation they should be addressed directly with the author for clarification.      Signed by: Cathe Mons, MD  Spectralink: [id number]      Answering Service: [phone]

## 2018-05-15 NOTE — Plan of Care (Addendum)
Problem: Day of Admission - Stroke  Goal: Core/Quality measure requirements - Admission  Outcome: Progressing  Pt admitted with Dx Lt Sided Weakness. Pt alert and oriented x 4. Denies  Diplopia/ blurry vision. Positive for dizziness- preexistent condition-Hx Vertigo. Follows commands. Drift to LLE present and Lt sided decreased sensation present. Adequate dorsiflexion/ plantar flexion. NIHSS 2. Speech clear / coherent.  MRI/CTA results in Epic. Neurologist on board. SB-SR on tele HR 50-60's. Denies chest pain, sob, palpitions or any associated symptoms.  VSS. Afebrile. O2 sat > 97%RA. Palpable peripheral pulses. Skin warm/dry. Verbal/ written informations re: Ischemic Stroke from CareNotes given. Pt able to teach back. Needs reinforcement. Continue monitoring neuro / respiratoryhemodynamic status, labs, tests.

## 2018-05-15 NOTE — PT Eval Note (Signed)
[facility]  [address]  [place], [place] [address]  [phone]    Physical Therapy Evaluation    Patient: Emily [name] MRN: [id number]   Unit: [facility] INTERMEDIATE CARE Bed: MI623/MI623-01    Time of Treatment: Time Calculation  PT Received On: 05/15/18  Start Time: 0905  Stop Time: 0930  Time Calculation (min): 25 min    Consult received for Emily [name] for PT evaluation and treatment.  Patient's medical condition is appropriate for Physical Therapy  intervention at this time.    Interpreter utilized: no, not indicated      D/C Suggestions   Based on today's performance:  Recommendation  Discharge Recommendation: Home with no needs  DME Recommended for Discharge:  (no DME needs)  PT Frequency: one time visit - therapy discontinued.      Transport Recommendations: no limitations    Discharge recommendations are based on patient's progression/regression. Please see most recent note for updated discharge recommendations.    Assessment     Emily [name] is a 72 y.o. female admitted 05/14/2018.  Pt presented with L sided weakness and numbness as well as vertigo. Pt reports all symptoms have resolved. Pt ambulated with steady gait x 300' and completed stair navigation independently.  Pt has no further acute PT needs. D/C PT           Complexity Level Hx and Co  morbidites Examination Clinical Decision Making Clinical Presentation   Low  no impact 1-2 elements Limited options Stable       PMP - Progressive Mobility Protocol   PMP Activity: Step 7 - Walks out of Room  Distance Walked (ft) (Step 6,7): 300 Feet        Interdisciplinary Communication: Discussed with RN/OT      Plan     Plan  Risks/Benefits/POC Discussed with Pt/Family: With patient  Treatment/Interventions: No skilled interventions needed at this time  PT Frequency: one time visit - therapy discontinued         Medical Diagnosis: Left-sided weakness [R53.1]    History of Present Illness: Emily [name] is a 72 y.o. female admitted on  05/14/2018  "after awakening at 2 AM and attempting to get out of bed noticing that her left leg was weak and she had vertigo.  Symptoms continued so she came into the emergency department.  Left leg continues to be weak with mild left arm weakness.  Vertigo has resolved.  No headache.  No other numbness weakness tingling.  No fever chills.  "      Patient Active Problem List   Diagnosis   . Malignant neoplasm of overlapping sites of right female breast   . Hypertension   . Mixed hyperlipidemia   . Vertigo   . Hypothyroid   . Left-sided weakness     Past Medical History:   Diagnosis Date   . Breast cancer 2014    right breast mastectomy   . GERD (gastroesophageal reflux disease)    . Hyperlipidemia    . Hypertension    . Hypothyroidism    . Malignant neoplasm of overlapping sites of right female breast 10/19/2015   . Vertigo      Past Surgical History:   Procedure Laterality Date   . HYSTERECTOMY     . MASTECTOMY Right 2015       X-Rays/Tests/Labs:  Lab Results   Component Value Date/Time    HGB 9.9 (L) 05/14/2018 10:24 AM    HCT 32.8 (L)  05/14/2018 10:24 AM    K 3.8 05/14/2018 10:24 AM    NA 142 05/14/2018 10:24 AM    INR 1.0 05/14/2018 10:24 AM    TROPI <0.01 05/14/2018 10:24 AM    TROPI <0.01 04/03/2018 01:52 PM    TROPI <0.01 02/07/2018 06:37 PM    TROPI <0.01 09/13/2017 12:52 PM    TROPI 0.01 07/15/2009 09:40 AM     MRI Brain with/without Contrast (Order #[id number]) on 05/14/2018 - Imaging Information   IMPRESSION:     Examination within normal limits    CT Head without Contrast (Order #[id number]) on 05/14/2018 - Imaging Information   IMPRESSION:     No evidence of acute intracranial abnormality.  Other  findings as above    Social History:  Lives alonein a apartment.  Entry Steps: 1 flight, no elevator accessRails: yesInside steps: 0Rails: 0  Equipment at home: tub/shower combination  Prior Level of Function: indep all ADL/IADL, no DME/AE at home, cooks, drives  Cognition:  WFL  Mobility: independent  Feeding: independent  Grooming: independent  Bathing: independent  Dressing: independent  Toileting: independent    Subjective   Patient is agreeable to participation in the therapy session. Nursing clears patient for therapy.  Patient's Goal:  To go home today  Pain: pt denies pain      Objective     Precautions/ Contraindications:   Precautions  Weight Bearing Status: no restrictions  Other Precautions: falls    Patient is in bed with  Telemetry and Intravenous Access in place.        Observation of patient/vitals:      Vitals:    05/15/18 0000 05/15/18 0400 05/15/18 0900 05/15/18 1140   BP: 141/64 162/70 162/70 144/63   Pulse:   65 61   Resp: 18 18 18 18    Temp: 97.9 F (36.6 C) (!) 96.9 F (36.1 C) 97.7 F (36.5 C) 98.5 F (36.9 C)   TempSrc: Axillary Oral Oral Oral   SpO2: 99%  100% 100%   Weight:       Height:           Orientation/Cognition:  Alert and Oriented x 4  Cognition: follows all commands    Musculoskeletal Examination:      ROM Strength   Neck/ Trunk WFL WFL   RLE WFL WFL   LLE [facility] WFL       Sensation: Intact to light touch, denies numbness/tingling to BLEs   Coordination: Intact to gross motor to BLEs       Functional Mobility:  Rolling: mod indep    Supine to sit: mod indep  Scooting: mod indep  Sit to Supine: NT  Sit to stand: mod indep  Stand to sit: mod indep  Transfers: mod indep  Ambulation:     Weightbearing: no restrictions   Assistance level: mod indep   Distance: 300'   Assistive Device: none   Gait Deviations: steady gait   Stairs: mod indep up/down full flight with 1 rail    Balance:  Static Sit: good  Dynamic Sit: good  Static Stand: good  Dynamic Stand: good    Endurance: good    Participation:  good    Education:  Educated the patient to role of physical therapy, plan of care, goals  of therapy and safety with mobility and ADLs.    Patient is in bedside chair with all needs  met, and equipment intact.  RN notified of session outcome.  Safety measures include: handoff to  nurse/clin tech/ unit secretary completed, oriented to call bell and placed within reach, personal items within reach and bed placed in lowest position.  Mobility status posted at bedside and within E.M.R.    AM-PACT Inpatient Short Forms  Inpatient AM-PACT Performed? (PT): Basic Mobility Inpatient Short Form   AM-PACT "6 Clicks" Basic Mobility Inpatient Short Form  Turning Over in Bed: None  Sitting Down On/Standing From Armchair: A little  Lying on Back to Sitting on Side of Bed: A little  Assist Moving to/from Bed to Chair: None  Assist to Walk in Hospital Room: None  Assist to Climb 3-5 Steps with Railing: None  PT Basic Mobility Raw Score: 22  CMS 0-100% Score: 20.91%      Goals  Goal Formulation: With patient (No therapy needs identified D/c PT)       Signature:  Sedonia Small, PT  05/15/2018  11:51 AM  Phone: [phone]    (For scheduling questions, please contact rehab tech [phone])    Attention MD:   Thank you for allowing [place] to participate in the care of [facility]. Regulations from the Center for Medicare and Medicaid Services (CMS) require your review and approval of this plan of care.     Please cosign this note indicating you are in agreement with theTherapy Plan of Care so we may initiate the therapy treatment plan.

## 2018-05-15 NOTE — OT Eval Note (Signed)
[facility]  [address]  [place], [place] [address]  [phone]    Occupational Therapy Evaluation    Patient: Emily [name] MRN: [id number]   Unit: [facility] INTERMEDIATE CARE Bed: MI623/MI623-01    Time of treatment: Time Calculation  OT Received On: 05/15/18  Start Time: 1135  Stop Time: 1148  Time Calculation (min): 13 min    Consult received for [name] for OT evaluation and treatment.  Patient's medical condition is appropriate for Occupational Therapy  intervention at this time.    Interpreter utilized: no, not indicated    D/C Suggestions   Home with no needs    Transport Recommendations: no limitations    Assessment       Emily [name] is a 72 y.o. female admitted 05/14/2018.  Pt admitted with dizziness and N/T as well as weakness in LUE. Pt reports resolution of all symptoms with exception of little numb spot on tongue. Pt performs LB dressing, toileting, transfers, and mobility indep this session.  Brief chart review completed including review of labs review of imaging review of vitals.  Pt's ability to complete ADLs and functional transfers is at/near fxn'l baseline. Pt does not have any acute OT needs at this time. D/C OT.         Complexity Chart Review Performance Deficits Clinical Decision Making Hx/Comorbidities Assistance needed   Low  Brief  1-3 Limited options None None (or at baseline)       PMP - Progressive Mobility Protocol   PMP Activity: Step 6 - Walks in Room  Distance Walked (ft) (Step 6,7): 20 Feet         Interdisciplinary Communication: discussed with PT/RN    Plan     OT Plan  Risks/Benefits/POC Discussed with Pt/Family: With patient  Treatment Interventions: No skilled interventions needed at this time  Discharge Recommendation: Home with no needs  DME Recommended for Discharge:  (no needs)  OT Frequency Recommended: one time visit - therapy discontinued         Medical Diagnosis: Left-sided weakness [R53.1]    History of Present Illness: Emily [name] is a 72 y.o.  female admitted on  05/14/2018 with "after awakening at 2 AM and attempting to get out of bed noticing that her left leg was weak and she had vertigo. Symptoms continued so she came into the emergency department. Left leg continues to be weak with mild left arm weakness. Vertigo has resolved. No headache. No other numbness weakness tingling. No fever chills"      Patient Active Problem List   Diagnosis   . Malignant neoplasm of overlapping sites of right female breast   . Hypertension   . Mixed hyperlipidemia   . Vertigo   . Hypothyroid     Past Medical History:   Diagnosis Date   . Breast cancer 2014    right breast mastectomy   . GERD (gastroesophageal reflux disease)    . Hyperlipidemia    . Hypertension    . Hypothyroidism    . Malignant neoplasm of overlapping sites of right female breast 10/19/2015   . Vertigo      Past Surgical History:   Procedure Laterality Date   . HYSTERECTOMY     . MASTECTOMY Right 2015         X-Rays/Tests/Labs:  Lab Results   Component Value Date/Time    HGB 9.9 (L) 05/14/2018 10:24 AM    HCT 32.8 (L) 05/14/2018 10:24 AM  K 3.8 05/14/2018 10:24 AM    NA 142 05/14/2018 10:24 AM    INR 1.0 05/14/2018 10:24 AM    TROPI <0.01 05/14/2018 10:24 AM    TROPI <0.01 04/03/2018 01:52 PM    TROPI <0.01 02/07/2018 06:37 PM    TROPI <0.01 09/13/2017 12:52 PM    TROPI 0.01 07/15/2009 09:40 AM     MRI Brain with/without Contrast (Order #[id number]) on 05/14/2018 - Imaging Information   IMPRESSION:   Examination within normal limits    CT Head without Contrast (Order #[id number]) on 05/14/2018 - Imaging Information   IMPRESSION:   No evidence of acute intracranial abnormality. Other  findings as above    Social History:  Lives alonein a apartment.  Entry Steps: 1 flight, no elevator accessRails: yesInside steps: 0Rails: 0  Equipment at home: tub/shower combination  Prior Level of Function: indep all ADL/IADL, no DME/AE at home, cooks, drives  Cognition:  WFL  Mobility: independent  Feeding: independent  Grooming: independent  Bathing: independent  Dressing: independent  Toileting: independent    Subjective   "I'm doing good"  Patient is agreeable to participation in the therapy session. Nursing clears patient for therapy.  Patient's Goal:  To go home and wear my heels  Pain: pt denies pain      Objective     Precautions:   Precautions  Weight Bearing Status: no restrictions  Other Precautions: falls    Patient is in bed with  Telemetry and Intravenous Access  in place.       Observation of patient/vitals:   Vitals:    05/15/18 0000 05/15/18 0400 05/15/18 0900 05/15/18 1140   BP: 141/64 162/70 162/70 144/63   Pulse:   65 61   Resp: 18 18 18 18    Temp: 97.9 F (36.6 C) (!) 96.9 F (36.1 C) 97.7 F (36.5 C) 98.5 F (36.9 C)   TempSrc: Axillary Oral Oral Oral   SpO2: 99%  100% 100%   Weight:       Height:           Orientation/Cognition:     Alert and Oriented x 4  Cognition: follows all commands      Musculoskeletal Examination:     ROM Strength   Neck/ Trunk WFL WFL   RUE WFL WFL   LUE WFL WFL   RLE WFL WFL   LLE [facility] WFL       Sensation: Intact to light touch, denies N/T throughout B UE's.   Coordination: Intact gross motor and serial opposition to B hands    Vision: WFL  Hearing: WFL      Functional Mobility:  Rolling: indep    Supine to sit: indep   Scooting: indep   Sit to Supine: indep   Sit to stand: indep   Stand to sit: indep   Transfers: indep    Ambulation: indep 20' no AD    Balance:  Static Sit Balance: good   Dynamic Sit Balance: good   Static Stand Balance: good   Dynamic Stand Balance: good     Self Care:  Eating: indep   Grooming: indep sinkside  LB Dressing: indep   Toileting: indep         Endurance: good    Participation:  good    Education:  Educated the patient to role of occupational therapy, plan of care, goals  of therapy and safety with mobility and ADLs.    RN  notified of session outcome and that  patient was left in bed with all needs met and equipment intact.   Safety measures include: handoff to nurse/clin tech/ unit secretary completed, oriented to call bell and placed within reach, personal items within reach, assistive device positioned out of reach and bed placed in lowest position.   Mobility and ADL status posted at bedside and within E.M.R.                Goals:  Goals  Goal Formulation: Patient  Time For Goal Achievement:  (no goals inidicated)      Signature:   Zebediah Beezley, OTR/L  05/15/2018  2:19 PM  Phone [phone]    (For scheduling questions, please contact rehab tech [phone])    Attention MD:   Thank you for allowing [place] to participate in the care of S[place] B Rawe. Regulations from the Center for Medicare and Medicaid Services (CMS) require your review and approval of this plan of care.     Please cosign this note indicating you are in agreement with theTherapy Plan of Care so we may initiate the therapy treatment plan.

## 2018-05-15 NOTE — Plan of Care (Signed)
Care plan goals adequate for discharge at this time. No falls or injuries noted with this admission.

## 2018-05-15 NOTE — Progress Notes (Signed)
MODIFIED RANKIN SCALE (MRS)    Score Description    0-  No symptoms at all    1-  No significant disability despite symptoms; able to carry out all usual duties and activities    2-  Slight disability; unable to carry out all previous activities, but able to look after own affairs without assistance    3-  Moderate disability; requiring some help, but able to walk without assistance    4-  Moderately severe disability; unable to walk without assistance and unable to attend to own bodily needs without assistance    5-  Severe disability; bedridden, incontinent and requiring constant nursing care and attention    6-  Dead      TOTAL (0-6): 0    Completed [id number] [facility]

## 2018-05-15 NOTE — Plan of Care (Signed)
Problem: Day of Discharge - Stroke  Goal: Able to express understanding of discharge instructions and stroke education  Outcome: Progressing   05/15/18 1950   Goal/Interventions addressed this shift   Able to express understanding of discharge instructions and stroke education Provide alternative method of communication if needed;Include patient care companion in decisions related to communication;Patient/patient care companion demonstrates understanding on disease process, treatment plan, medications and discharge plan;Ensure "Discharge instructions: Stroke" appears on discharge paperwork (After Visit Summary-AVS);Consult/collaborate with Case Management/Social Work     Goal: Core/Quality measures - Discharge  Outcome: Progressing   05/15/18 1950   Goal/Interventions addressed this shift   Core/Quality measures - Discharge  Document discharge NIH Stroke Scale;Document Modified Rankin Scale ([place] only);Ensure antithrombotic ordered or contraindication documented by LIP;Ensure statin ordered or contraindication documented by LIP;Complete final verification medications: check and compare prior to admit (PTA), during hospitalization and discharge medication list;Document discharge stroke education completed and patient/family verbalizes understanding

## 2018-05-15 NOTE — SLP Eval Note (Signed)
[facility]  [address]  [place], [place] [address]  [phone]    SPEECH LANGUAGE COGNITIVE SWALLOW EVALUATION    Patient: Emily [name]    MRN#: [id number]    Date/Time of Evaluation:   Time Calculation  SLP Received On: 05/15/18  Start Time: 0845  Stop Time: 0915  Time Calculation (min): 30 min  Total Treatment Time (min): 30    Consult received for Emily [name] for SLP Evaluation and Treatment.    Referring Physician: Dr. Georgetta Haber    Date of Referral: 05/15/2018      Interpreter utilized: no, not indicated  Assessment and Clinical Impression   Emily [name] is a 72 y.o. female admitted 05/14/2018 for  Left-sided weakness [R53.1] presenting with speech, language and cognition WFL and suspect at baseline. Pt exhibits adequate communication, processing, word retrieval, organization, problem solving and memory. Pt reports no changes or problems with above. Swallow is [name] for thin liquids, purees and regular solids as seen with breakfast items. Initiation of swallow appears timely and laryngeal elevation is present to palpation. No cough, change in vocal quality or other s/s of aspiration observed. Mastication of a regular solid (bacon) was slow, yet effective. Pt used a liquid assist to clear min oral residuals. Given findings on bedside swallow evaluation and stable respiratory status, feel pt is appropriate to continue with a regular diet with thin liquids.      Indication for instrumental assessment of swallow: n/a          Plan   Patient does not need SLP follow-up.  Regular diet with thin liquids.    Interdisciplinary Communication: Discussed with RN.       Nursing Dysphagia Screen: pass    Medical Diagnosis: Left-sided weakness [R53.1]    History of Present Illness:   Emily [name] is a 72 y.o. female admitted on  05/14/2018 with Left-sided weakness [R53.1]    Past Medical/Surgical History:  Past Medical History:   Diagnosis Date   . Breast cancer 2014    right breast mastectomy   . GERD  (gastroesophageal reflux disease)    . Hyperlipidemia    . Hypertension    . Hypothyroidism    . Malignant neoplasm of overlapping sites of right female breast 10/19/2015   . Vertigo      Past Surgical History:   Procedure Laterality Date   . HYSTERECTOMY     . MASTECTOMY Right 2015       Social History: Pt reports that she lives alone since her husband died 1 yr ago. 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    Subjective   Patient is agreeable to participation in the therapy session. Nursing clears patient for therapy. Patient's medical condition is  appropriate for Speech therapy intervention at this time.  Patient's Goal:  n/a  Pain: no evidence of pain noted during session         Precautions: none    Objective   Completed swallow and SLP evaluation with formal and informal measures. RIPA-Problem Solving and Abstract Reasoning-100%, reading functional information-100%, following simple 2 step directions-100%, responsive naming-100%      Educated the patient to role of speech therapy, plan of care, and goals of  therapy.      Goals of Care:  treatment/curative pathway    Goals:   n/a    Signature:[name], M.S. CCC-SLP    [phone] for Rehab Tech/scheduling questions)

## 2018-05-15 NOTE — Discharge Summary (Signed)
PROGRESS NOTE/DISCHARGE SUMMARY      Date Time: 05/15/18 3:11 PM  Patient Name: Emily [name]  Attending Physician: Carley Hammed, MD  Primary Care Physician: Ramon Dredge, MD    Date of Admission:   05/14/2018    Date of Discharge:     05/15/2018    Discharge Dx:     Active Hospital Problems    Diagnosis POA   . Hypothyroid Yes   . Mixed hyperlipidemia Yes   . Hypertension Yes   . Malignant neoplasm of overlapping sites of right female breast Yes      Resolved Hospital Problems    Diagnosis POA   . Principal Problem: Left-sided weakness Yes        Consultations:   Neurology    Procedures/Radiology performed:   Cta  Head & Neck    Result Date: 05/14/2018   Vascular atherosclerotic changes. No evidence of hemodynamically significant stenosis. No large vessel occlusion.    Georgana Curio, MD 05/14/2018 10:35 AM    Ct Head Without Contrast    Result Date: 05/14/2018    No evidence of acute intracranial abnormality.  Other findings as above. Gustavus Messing, MD 05/14/2018 10:04 AM    Ct Perfusion Brain    Result Date: 05/14/2018   Examination within normal limits. Georgana Curio, MD 05/14/2018 10:20 AM    Mri Brain With/without Contrast    Result Date: 05/14/2018    Examination within normal limits. Georgana Curio, MD 05/14/2018 1:05 PM    US Breast Left Ltd    Result Date: 04/30/2018   A complex collection identified previously within the superficial soft tissues of the left axilla is no longer apparent. BIRADS: Bi-rads(tm)category 2: Benign finding. RECOMMENDATION: Please continue yearly screening mammography. Fonnie Mu, MD 04/30/2018 9:14 AM            Hospital Course:      Emily [name] is a 72 y.o. female with history of mixed hyperlipidemia, breast cancer, hypothyroidism, GERD, and vertigo presented to the ED with reports of left-sided weakness.  Patient stated she awoke approximately 2 AM and attempted to walk to the bathroom when she found her left side weak and numb.  She was unable to ambulate normally and fell  twice to the floor.  She also reports noting blurred vision and strange sensation in her face.  Patient returned to sleep but upon awakening symptoms had not resolved fully prompting her to call EMS.    Initial assessment significant for mild left-sided weakness, facial weakness, and sensory deficit of the left side.  CT imaging negative for large occlusion.  Initial MRI imaging negative for acute infarct.  ECG with no signs of atrial arrhythmias.      She was seen in consultation by neurology.  Patient had spontaneous resolution of all her neurologic symptoms.  She had no evidence of arrhythmia on telemetry monitoring.    She no recurrence of symptoms while under observation.  Patient noted to have significant mixed hyperlipidemia.  She had previously been prescribed Crestor by her cardiologist as Zocor was ineffective.  Patient did not pick up prescription due to cost.  Patient shown "good Rx" app which shows a significant discount to the cause of this medication at her local [facility].    She was discharged home in stable condition on aspirin and Crestor as well as her prior outpatient medications.  She was instructed to follow-up with neurology as an outpatient in approximately 3 weeks.  DISCHARGE DAY EXAM:  Patient Vitals for the past 24 hrs:   BP Temp Temp src Pulse Resp SpO2 Height Weight   05/15/18 1140 144/63 98.5 F (36.9 C) Oral 61 18 100 % - -   05/15/18 0900 162/70 97.7 F (36.5 C) Oral 65 18 100 % - -   05/15/18 0400 162/70 (!) 96.9 F (36.1 C) Oral - 18 - - -   05/15/18 0000 141/64 97.9 F (36.6 C) Axillary - 18 99 % - -   05/14/18 2000 161/68 98.1 F (36.7 C) Oral 60 18 97 % - -   05/14/18 1924 189/79 97.2 F (36.2 C) Oral 62 19 97 % 1.6 m (5\' 3" ) 73.3 kg (161 lb 8 oz)   05/14/18 1800 166/72 - - (!) 55 20 98 % - -       General: awake, alert, oriented x 4 no acute distress.  Cardiovascular: regular rate and rhythm, no murmurs, rubs or gallops  Lungs: clear to auscultation bilaterally,  without wheezing, rhonchi, or rales.  Abdomen: soft, non-tender, non-distended with normal BS.   Extremities: no edema       Discharge Medications:     Current Discharge Medication List      CONTINUE these medications which have CHANGED    Details   lisinopril (PRINIVIL,ZESTRIL) 20 MG tablet Take 1 tablet (20 mg total) by mouth daily    Associated Diagnoses: Essential hypertension; Dyspepsia         CONTINUE these medications which have NOT CHANGED    Details   acetaminophen (TYLENOL) 500 MG tablet Take 500 mg by mouth as needed for Pain.      amLODIPine (NORVASC) 5 MG tablet Take 1 tablet (5 mg total) by mouth daily  Qty: 30 tablet, Refills: 3    Associated Diagnoses: Essential hypertension      anastrozole (ARIMIDEX) 1 MG tablet Take 1 tablet (1 mg total) by mouth daily  Qty: 90 tablet, Refills: 3    Associated Diagnoses: Malignant neoplasm of overlapping sites of right breast in female, estrogen receptor positive      aspirin 81 MG EC tablet Take 81 mg by mouth every morning          levothyroxine (SYNTHROID, LEVOTHROID) 25 MCG tablet Take 25 mcg by mouth every morning.          Multiple Vitamins-Minerals (MULTIVITAMIN WITH MINERALS) tablet Take 1 tablet by mouth every morning          rosuvastatin (CRESTOR) 40 MG tablet Take 40 mg by mouth daily      vitamin B-1 (THIAMINE) 100 MG tablet Take 100 mg by mouth every morning.               Discharge Instructions:   See hospital course and discharge AVS.     Signed by: Lynnell Jude, ANP    CC: Ramon Dredge, MD

## 2018-05-18 ENCOUNTER — Ambulatory Visit (INDEPENDENT_AMBULATORY_CARE_PROVIDER_SITE_OTHER): Payer: Medicare Other | Admitting: Hematology & Oncology

## 2018-05-27 LAB — ECG 12-LEAD
Atrial Rate: 71 {beats}/min
P Axis: 48 degrees
P-R Interval: 164 ms
Q-T Interval: 376 ms
QRS Duration: 92 ms
QTC Calculation (Bezet): 408 ms
R Axis: 17 degrees
T Axis: -2 degrees
Ventricular Rate: 71 {beats}/min

## 2018-06-20 ENCOUNTER — Emergency Department: Payer: Medicare Other

## 2018-06-20 ENCOUNTER — Emergency Department
Admission: EM | Admit: 2018-06-20 | Discharge: 2018-06-20 | Disposition: A | Discharge status: Home / Self Care | Payer: Medicare Other | Attending: General Practice | Admitting: General Practice

## 2018-06-20 DIAGNOSIS — R0789 Other chest pain: Secondary | ICD-10-CM | POA: Insufficient documentation

## 2018-06-20 DIAGNOSIS — M25561 Pain in right knee: Secondary | ICD-10-CM | POA: Insufficient documentation

## 2018-06-20 DIAGNOSIS — S80811A Abrasion, right lower leg, initial encounter: Secondary | ICD-10-CM | POA: Insufficient documentation

## 2018-06-20 DIAGNOSIS — W010XXA Fall on same level from slipping, tripping and stumbling without subsequent striking against object, initial encounter: Secondary | ICD-10-CM | POA: Insufficient documentation

## 2018-06-20 DIAGNOSIS — Z7982 Long term (current) use of aspirin: Secondary | ICD-10-CM | POA: Insufficient documentation

## 2018-06-20 DIAGNOSIS — Z853 Personal history of malignant neoplasm of breast: Secondary | ICD-10-CM | POA: Insufficient documentation

## 2018-06-20 DIAGNOSIS — I10 Essential (primary) hypertension: Secondary | ICD-10-CM | POA: Insufficient documentation

## 2018-06-20 DIAGNOSIS — T148XXA Other injury of unspecified body region, initial encounter: Secondary | ICD-10-CM

## 2018-06-20 DIAGNOSIS — M25511 Pain in right shoulder: Secondary | ICD-10-CM | POA: Insufficient documentation

## 2018-06-20 DIAGNOSIS — Z79899 Other long term (current) drug therapy: Secondary | ICD-10-CM | POA: Insufficient documentation

## 2018-06-20 DIAGNOSIS — E039 Hypothyroidism, unspecified: Secondary | ICD-10-CM | POA: Insufficient documentation

## 2018-06-20 DIAGNOSIS — E785 Hyperlipidemia, unspecified: Secondary | ICD-10-CM | POA: Insufficient documentation

## 2018-06-20 DIAGNOSIS — W101XXA Fall (on)(from) sidewalk curb, initial encounter: Secondary | ICD-10-CM

## 2018-06-20 NOTE — EDIE (Signed)
COLLECTIVE?NOTIFICATION?06/20/2018 10:05?Emily [name], Emily [name]?MRN: [id number]    [place] [facility]'s patient encounter information:   VWU:?[id number]  Account [id number]  Billing Account [id number]      Criteria Met      5 ED Visits in 12 Months    Security and Safety  No recent Security Events currently on file    ED Care Guidelines  There are currently no ED Care Guidelines for this patient. Please check your facility's medical records system.      Prescription Monitoring Program  PDMP query found no report.  Narx Score not available at this time.      E.D. Visit Count (12 mo.)  Facility Visits   [place] [facility] 6   Total 6   Note: Visits indicate total known visits.      Recent Emergency Department Visit Summary  Date Facility [facility] Type Diagnoses or Chief Complaint   Jun 20, 2018 [place] [name]      fell at home, injuring head, face, right knee, feels lightheaded      May 14, 2018 [place] [name]      dizziness, sycope      Extremity Weakness      Dizziness      Weakness      Apr 03, 2018 [place] [facility] [place] [facility]      medic      Generalized weakness      Nausea      Dizziness and giddiness      Unspecified nystagmus      Weakness      Feb 07, 2018 [place]  [facility] [name]      Triage-Weakness      Generalized Body Aches      Acute bronchitis, unspecified      Sep 13, 2017 [place] [facility] [facility]      body aches      Nausea      Generalized Body Aches      Dizziness      Chest pain, unspecified      Essential (primary) hypertension      Myalgia, unspecified site      Paresthesia of skin      Jul 10, 2017 [place] [facility] [name]      Dizziness      Numbness      Chest Pain      Other chest pain      Other chronic pain          Recent Inpatient Visit Summary  Date Facility [facility] Type Diagnoses or Chief Complaint   Apr 03, 2018 [place] [facility] [place] [facility]      Unspecified nystagmus      Dizziness and giddiness      Weakness      Nausea          Care Team  There is not a care team on record at this time.   Collective Portal  This patient has registered at the [facility] Emergency Department   For more information visit: [email or web address]     PLEASE NOTE:    1.   Any care recommendations and other clinical information are provided as guidelines or for historical purposes only, and providers should exercise their own clinical judgment when providing care.  2.   You may only use this information for purposes of treatment, payment or health care operations activities, and subject to the limitations of applicable [employer].    3.   You should consult directly with the organization that provided a care guideline or other clinical history with any questions about additional information or accuracy or completeness of information provided.    ? 2019 [employer] - www.[employer]medical.com

## 2018-06-20 NOTE — Discharge Instructions (Signed)
May take Tylenol as needed for discomfort.     As discussed follow up with your [facility] for further imaging and evaluation of lung nodule noted on chest x-ray    Fall Prevention (Edu)    You asked for information on Fall Prevention.    There was a 2003 study from the Journal of the [employer] on fall-related injuries. It shows that more than 1.8 million adults, aged 73 and older, were treated in emergency departments for such injuries. More than 421,000 were hospitalized. The most common fall injuries are head injuries. These in turn cause brain injury and fractures (broken bones). Hip fractures are the most serious types of broken bones that happen from falls. They lead to the most health problems and deaths.    To make the living area safer, older adults should:   Improve lighting throughout the home. Use night-lights to help see at night.   Have handrails put in on both sides of stairways.   Have grab bars put next to the toilet and in the shower. Also think about getting an elevated (high) toilet seat and a shower chair.   Use non-slip bath mats in the tub or shower.   Take out "throw rugs" to prevent tripping.   Avoid long robes to prevent tripping.   Wear well-fitted shoes or slippers. Loose footwear can make you shuffle. This makes you more likely to trip and fall. You can also buy inexpensive anti-slip socks.   Keep all electrical cords and small objects out of the pathway.   Any cane, walker or other assistive device used needs to be checked regularly. The devices must be used correctly to prevent injuries.   Move about at a pace that is comfortable for your ability. For example, do not rush to answer the doorbell or phone. Take your time.    Recent studies have identified some risk factors that make older adults more likely to have falls. Changing these risk factors helps to prevent falls.   Exercise: Regular physical activity or exercise make the body stronger. They also  improve balance.   Medicine Review: Follow up with your doctor and pharmacist as needed to review your medicines and any new changes. They can tell you if there are side-effects or drug interactions (if the medicines affect other medicines you are taking). If you are taking sedatives or sleeping pills, it may be possible to lower the dosage or number of medicines. These kinds of medicines can cause drowsiness and dizziness. This makes it more likely you will fall.   Vision Checks: Follow up with an eye doctor at least once a year to have your vision checked.               Joint Pain    You have been seen for joint pain.    Many things can cause pain in your joints. Having joint pain means that you often have inflammation in your joints.     Some things that can cause joint pain are:   Rheumatoid arthritis (inflammation of the joints).   Trauma or injury to your joints.   A viral infection.   Overuse of your joints.   Obesity that causes excess wear and tear on your joints.   Gout (swelling of the joints).   Osteoarthritis (breakdown of cartilage in the joints).   Inflammation of a joint tendon (tendinitis).    It is not yet known why you are having joint pains. You might need another  exam or more tests to find out why you have these symptoms. At this time, the cause of your symptoms does not seem dangerous. You don't need to stay in the hospital.    Some things you can try at home are:   Rest the affected joint.   Frequent stretching.   Massage.   NSAID medications like ibuprofen (Advil or Motrin), naproxen (Aleve, Naprosyn).   Elevating the joints that hurt.    Follow the instructions for any medication you are prescribed.     Follow up with your doctor in 7 days.    We don't believe your condition is dangerous right now. However, you need to be careful. Sometimes a problem that seems small can get serious later. Therefore, it is very important for you to come back here or go to the  nearest Emergency Department if you don't get better or your symptoms get worse.    YOU SHOULD SEEK MEDICAL ATTENTION IMMEDIATELY, EITHER HERE OR AT THE NEAREST EMERGENCY DEPARTMENT, IF ANY OF THE FOLLOWING OCCUR:     You have a fever (temperature higher than 100.69F or 38C).   Your pain does not go away or gets worse.   You notice redness over the joint.   Your painful joint gets very swollen.   You don't feel better after treatment.   Any other symptoms, concerns, or not getting better as expected.    If you can't follow up with your doctor, or if at any time you feel you need to be rechecked or seen again, come back here or go to the nearest emergency department.               Fall, No Apparent Injury    You were seen for a fall.    There are many reasons why people fall. It is important to make sure you didn't get hurt from the fall. It is also important to make sure that you didn't fall for another medical reason.    Sometimes people fall by simply tripping over something or slipping. This is called a "mechanical fall." Sometimes, it's just from a simple accident. Sometimes, a person will think he/she just tripped, but the fall was really caused by a medical problem. This is true especially in the elderly. Some things that often cause falls are low blood sugar, fainting or near fainting. The reason could also be a balance problem, weakness, stroke, seizure or another serious problem. You may need more tests if the doctor thinks a more serious problem caused your fall. These could be blood tests, EKGs, x-rays, CT scans or MRIs.    You don't seem to have any serious injuries. It is OK for you to go home today without more testing.    Follow up with your doctor.     You should make small changes to your home to keep from falling. You could add a tub mat to keep you from slipping. You can also fix loose carpeting and rugs so you won't trip.    YOU SHOULD SEEK MEDICAL ATTENTION IMMEDIATELY, EITHER  HERE OR AT THE NEAREST EMERGENCY DEPARTMENT, IF ANY OF THE FOLLOWING OCCUR:   You cannot speak clearly (slurring), one side of your face droops or you feel weak in the arms or legs (especially on one side).   You "pass out."   You have severe headache, dizziness or problems with balance.   You have severe pain.   You cannot walk.   You fall and  hit your head.   You have other concerns.

## 2018-06-20 NOTE — ED Provider Notes (Signed)
EMERGENCY DEPARTMENT NOTE    Physician/Midlevel provider first contact with patient: 06/20/18 1052         HISTORY OF PRESENT ILLNESS   Historian:Patient  Translator Used: No    Chief Complaint: Fall     Mechanism of Injury:       72 y.o. female presents to ED with right shoulder, right chest wall and right knee pain S/P fall.  Patient states she tripped over a rock yesterday evening falling onto her right side hitting her right shoulder, right chest, right knee and right side of head.  Denies HA, vision changes, motor or sensory deficits, SOB, lightheadedness, or LOC.    1. Location of symptoms: right shoulder, right chest wall, right knee  2. Onset of symptoms: yesterday evening  3. What was patient doing when symptoms started (Context): see above  4. Severity: moderate  5. Timing: constant  6. Activities that worsen symptoms: movement of right shouler  7. Activities that improve symptoms: none  8. Quality: ache  9. Radiation of symptoms: no  10. Associated signs and Symptoms: see above  11. Are symptoms worsening? no  MEDICAL HISTORY     Past Medical History:  Past Medical History:   Diagnosis Date   . Breast cancer 2014    right breast mastectomy   . GERD (gastroesophageal reflux disease)    . Hyperlipidemia    . Hypertension    . Hypothyroidism    . Malignant neoplasm of overlapping sites of right female breast 10/19/2015   . Vertigo        Past Surgical History:  Past Surgical History:   Procedure Laterality Date   . HYSTERECTOMY     . MASTECTOMY Right 2015       Social History:  Social History     Social History   . Marital status: Widowed     Spouse name: N/A   . Number of children: N/A   . Years of education: N/A     Occupational History   . Not on file.     Social History Main Topics   . Smoking status: Never Smoker   . Smokeless tobacco: Never Used   . Alcohol use 0.6 oz/week     1 Cans of beer per week      Comment: socially   . Drug use: No   . Sexual activity: Not on file     Other Topics Concern   .  Not on file     Social History Narrative   . No narrative on file       Family History:  Family History   Problem Relation Age of Onset   . Breast cancer Neg Hx        Outpatient Medication:  Discharge Medication List as of 06/20/2018  1:47 PM      CONTINUE these medications which have NOT CHANGED    Details   acetaminophen (TYLENOL) 500 MG tablet Take 500 mg by mouth as needed for Pain., Historical Med      amLODIPine (NORVASC) 5 MG tablet Take 1 tablet (5 mg total) by mouth daily, Starting Thu 03/15/2018, Normal      anastrozole (ARIMIDEX) 1 MG tablet Take 1 tablet (1 mg total) by mouth daily, Starting Fri 01/19/2018, Normal      aspirin 81 MG EC tablet Take 81 mg by mouth every morning    , Historical Med      levothyroxine (SYNTHROID, LEVOTHROID) 25 MCG tablet Take 25 mcg by  mouth every morning.    , Starting Wed 12/25/2013, Historical Med      lisinopril (PRINIVIL,ZESTRIL) 20 MG tablet Take 1 tablet (20 mg total) by mouth daily, Starting Tue 05/15/2018, No Print      Multiple Vitamins-Minerals (MULTIVITAMIN WITH MINERALS) tablet Take 1 tablet by mouth every morning    , Historical Med      rosuvastatin (CRESTOR) 40 MG tablet Take 40 mg by mouth daily, Historical Med      vitamin B-1 (THIAMINE) 100 MG tablet Take 100 mg by mouth every morning., Historical Med               REVIEW OF SYSTEMS   Review of Systems   Cardiovascular: Positive for chest pain.   Musculoskeletal: Positive for falls and joint pain.   All other systems reviewed and are negative.           PHYSICAL EXAM     ED Triage Vitals [06/20/18 1007]   Enc Vitals Group      BP 144/76      Heart Rate 77      Resp Rate 16      Temp 98.6 F (37 C)      Temp src       SpO2 100 %      Weight 73.5 kg      Height 1.6 m      Head Circumference       Peak Flow       Pain Score 8      Pain Loc       Pain Edu?       Excl. in GC?    Physical Exam   Constitutional: She is oriented to person, place, and time. She appears well-developed and well-nourished. No distress.    HENT:   Head: Normocephalic and atraumatic.   Eyes: Pupils are equal, round, and reactive to light. Conjunctivae and EOM are normal.   Neck: Normal range of motion. Neck supple.   Cardiovascular: Normal rate.    Pulmonary/Chest: Effort normal.   Musculoskeletal: Normal range of motion. She exhibits tenderness. She exhibits no edema or deformity.   +diffuse right shoulder, right anterior/lateral/posterior chest and right knee TTP  All extremity normal ROM   Neurological: She is alert and oriented to person, place, and time. No cranial nerve deficit or sensory deficit. She exhibits normal muscle tone. Coordination normal.   Skin: Skin is warm. Capillary refill takes less than 2 seconds. She is not diaphoretic.   Skin abrasion below right knee.  No active bleeding.   Psychiatric: She has a normal mood and affect.   Nursing note and vitals reviewed.        MEDICAL DECISION MAKING     DISCUSSION    Well appearing, NAD in ED.  No deformity, discoloration, or edema on exam.  Normal neuro exam.  Imaging of head, right shoulder, right side of chest and right knee negative for acute abnormality.  Patient made aware of nodule noted in lung and need for follow up for reevalution.  Patient comfortable with plan for discharge on tylenol as needed for discomfort and PCM follow up.  Return precautions given.          Vital Signs: Reviewed the patient?s vital signs.   Nursing Notes: Reviewed and utilized available nursing notes.  Medical Records Reviewed: Reviewed available past medical records.  Counseling: The emergency provider has spoken with the patient and discussed today?s findings, in addition to providing  specific details for the plan of care.  Questions are answered and there is agreement with the plan.      MIPS DOCUMENTATION          CARDIAC STUDIES    The following cardiac studies were independently interpreted by the Emergency Medicine Physician.  For full cardiac study results please see chart.    Monitor  Strip  Interpreted by ED Physician  Rate: 77  Rhythm: NSR   ST Changes: none        EMERGENCY IMAGING STUDIES    The following imagine studies were independently interpreted by me (emergency physician):    Radiology:  Interpreted by me Johnsie Cancel)  Study: Chest Xray   Results: No infiltrate. No pneumothorax. No hemothorax. No cardiomegaly. No CHF.  Impression: No acute intrathoracic abnormality.    Radiology:  Exam interpreted by me Johnsie Cancel)  Exam: right knee  Findings: No fracture. No Dislocation. No foreign body.  Impression: No acute abnormality.     Radiology:  Exam interpreted by me Johnsie Cancel)  Exam: right shoulder  Findings: No fracture. No Dislocation. No foreign body.  Impression: No acute abnormality.     RADIOLOGY IMAGING STUDIES      CT Head WO Contrast   Final Result    No acute intracerebral abnormality.      Note: Note that CT scanning at this site  utilizes multiple dose   reduction techniques including automatic exposure control, adjustment of   the MAA and/or KVP according to patient's size and use of iterative   reconstruction technique       Laurena Slimmer, MD    06/20/2018 1:21 PM      Ribs Right/PA Chest   Final Result    No rib fracture or osseous destruction. Vague nodular   density at the right lung base. Differential include acute infiltrate,   round atelectasis or mass. Short-term follow-up recommended.      Kinnie Feil, MD    06/20/2018 12:12 PM      Shoulder Right 2+ Views   Final Result    Degenerative changes described above. No acute fracture or   dislocation.      Kinnie Feil, MD    06/20/2018 12:10 PM      Knee 4+ Views Right   Final Result    Plain films suggest old healed fracture of the proximal   fibula. No acute fracture or joint effusion      Kinnie Feil, MD    06/20/2018 12:09 PM              PULSE OXIMETRY    Oxygen Saturation by Pulse Oximetry: 99%  Interventions: none  Interpretation:  Normal     EMERGENCY DEPT. MEDICATIONS      ED Medication Orders     None           LABORATORY RESULTS    Ordered and independently interpreted AVAILABLE laboratory tests. Please see results section in chart for full details.  Results for orders placed or performed during the hospital encounter of 05/14/18   CBC and differential   Result Value Ref Range    WBC 7.48 3.10 - 9.50 x10 3/uL    Hgb 9.9 (L) 11.4 - 14.8 g/dL    Hematocrit 16.1 (L) 34.7 - 43.7 %    Platelets 272 142 - 346 x10 3/uL    RBC 3.68 (L) 3.90 - 5.10 x10 6/uL    MCV 89.1 78.0 - 96.0 fL  MCH 26.9 25.1 - 33.5 pg    MCHC 30.2 (L) 31.5 - 35.8 g/dL    RDW 14 11 - 15 %    MPV 10.8 8.9 - 12.5 fL    Neutrophils 59.4 None %    Lymphocytes Automated 30.9 None %    Monocytes 8.4 None %    Eosinophils Automated 0.7 None %    Basophils Automated 0.3 None %    Immature Granulocyte 0.3 None %    Nucleated RBC 0.0 0.0 - 0.0 /100 WBC    Neutrophils Absolute 4.45 1.10 - 6.33 x10 3/uL    Abs Lymph Automated 2.31 0.42 - 3.22 x10 3/uL    Abs Mono Automated 0.63 0.21 - 0.85 x10 3/uL    Abs Eos Automated 0.05 0.00 - 0.44 x10 3/uL    Absolute Baso Automated 0.02 0.00 - 0.08 x10 3/uL    Absolute Immature Granulocyte 0.02 0.00 - 0.07 x10 3/uL    Absolute NRBC 0.00 0.00 - 0.00 x10 3/uL   Comprehensive metabolic panel   Result Value Ref Range    Glucose 102 (H) 70 - 100 mg/dL    BUN 13 7 - 19 mg/dL    Creatinine 0.9 0.6 - 1.0 mg/dL    Sodium 119 147 - 829 mEq/L    Potassium 3.8 3.5 - 5.1 mEq/L    Chloride 104 100 - 111 mEq/L    CO2 32 (H) 22 - 29 mEq/L    Calcium 8.5 7.9 - 10.2 mg/dL    Protein, Total 5.3 (L) 6.0 - 8.3 g/dL    Albumin 3.2 (L) 3.5 - 5.0 g/dL    AST (SGOT) 16 5 - 34 U/L    ALT 15 0 - 55 U/L    Alkaline Phosphatase 83 37 - 106 U/L    Bilirubin, Total 0.3 0.2 - 1.2 mg/dL    Globulin 2.1 2.0 - 3.6 g/dL    Albumin/Globulin Ratio 1.5 0.9 - 2.2    Anion Gap 6.0 5.0 - 15.0   Protime - INR   Result Value Ref Range    PT 13.5 12.6 - 15.0 sec    PT INR 1.0 0.9 - 1.1   APTT   Result Value Ref Range    PTT 32 23 - 37 sec   Troponin I   Result Value  Ref Range    Troponin I <0.01 0.00 - 0.09 ng/mL   GFR   Result Value Ref Range    EGFR >60.0    TSH   Result Value Ref Range    TSH 0.50 0.35 - 4.94 uIU/mL   Hemoglobin A1C   Result Value Ref Range    Hemoglobin A1C 6.0 (H) 4.6 - 5.9 %    Average Estimated Glucose 125.5 mg/dL   Lipid panel   Result Value Ref Range    Cholesterol 189 0 - 199 mg/dL    Triglycerides 65 34 - 149 mg/dL    HDL 59 40 - 5,621 mg/dL    LDL Calculated 308 (H) 0 - 99 mg/dL    VLDL Cholesterol Cal 13 10 - 40 mg/dL    CHOL/HDL Ratio 3.2 See Below   Hemolysis index   Result Value Ref Range    Hemolysis Index 13 0 - 18   ADULT Urinalysis Reflex to Microscopic Exam - Reflex to Culture   Result Value Ref Range    Urine Type Urine, Clean Ca     Color, UA Yellow Colorless - Yellow  Clarity, UA Clear Clear - Hazy    Specific Gravity UA 1.012 1.001 - 1.035    Urine pH 6.0 5.0 - 8.0    Leukocyte Esterase, UA Negative Negative    Nitrite, UA Negative Negative    Protein, UR Negative Negative    Glucose, UA Negative Negative    Ketones UA Negative Negative    Urobilinogen, UA Negative 0.2 - 2.0 mg/dL    Bilirubin, UA Negative Negative    Blood, UA Negative Negative   Glucose Whole Blood - POCT   Result Value Ref Range    POCT - Glucose Whole blood 92 70 - 100 mg/dL   ECG 12 lead   Result Value Ref Range    Ventricular Rate 71 BPM    Atrial Rate 71 BPM    P-R Interval 164 ms    QRS Duration 92 ms    Q-T Interval 376 ms    QTC Calculation (Bezet) 408 ms    P Axis 48 degrees    R Axis 17 degrees    T Axis -2 degrees       CRITICAL CARE/PROCEDURES    Procedures      DIAGNOSIS      Diagnosis:  Final diagnoses:   Fall involving sidewalk curb, initial encounter   Acute pain of right shoulder   Acute pain of right knee   Chest wall pain   Abrasion       Disposition:  ED Disposition     ED Disposition Condition Date/Time Comment    Discharge  Wed Jun 20, 2018  1:46 PM Emily [name] discharge to home/self care.    Condition at disposition: Stable           Prescriptions:  Discharge Medication List as of 06/20/2018  1:47 PM      CONTINUE these medications which have NOT CHANGED    Details   acetaminophen (TYLENOL) 500 MG tablet Take 500 mg by mouth as needed for Pain., Historical Med      amLODIPine (NORVASC) 5 MG tablet Take 1 tablet (5 mg total) by mouth daily, Starting Thu 03/15/2018, Normal      anastrozole (ARIMIDEX) 1 MG tablet Take 1 tablet (1 mg total) by mouth daily, Starting Fri 01/19/2018, Normal      aspirin 81 MG EC tablet Take 81 mg by mouth every morning    , Historical Med      levothyroxine (SYNTHROID, LEVOTHROID) 25 MCG tablet Take 25 mcg by mouth every morning.    , Starting Wed 12/25/2013, Historical Med      lisinopril (PRINIVIL,ZESTRIL) 20 MG tablet Take 1 tablet (20 mg total) by mouth daily, Starting Tue 05/15/2018, No Print      Multiple Vitamins-Minerals (MULTIVITAMIN WITH MINERALS) tablet Take 1 tablet by mouth every morning    , Historical Med      rosuvastatin (CRESTOR) 40 MG tablet Take 40 mg by mouth daily, Historical Med      vitamin B-1 (THIAMINE) 100 MG tablet Take 100 mg by mouth every morning., Historical Med                Judeth Porch L, DO  06/20/18 1427

## 2018-06-20 NOTE — ED Notes (Signed)
MD at bedside. 

## 2018-06-20 NOTE — ED Triage Notes (Signed)
Patient tripped and fell in her yard, landing on her right side. C/o of right shoulder, back, and chest pain, and right knee pain. Hit her head. No loc. Does not take any blood thinners.

## 2018-07-06 ENCOUNTER — Other Ambulatory Visit: Payer: Self-pay | Admitting: Family Medicine

## 2018-07-06 ENCOUNTER — Ambulatory Visit
Admission: RE | Admit: 2018-07-06 | Discharge: 2018-07-06 | Disposition: A | Discharge status: Home / Self Care | Payer: Medicare Other | Source: Ambulatory Visit | Attending: Family Medicine | Admitting: Family Medicine

## 2018-07-06 DIAGNOSIS — R911 Solitary pulmonary nodule: Secondary | ICD-10-CM

## 2018-07-06 DIAGNOSIS — J984 Other disorders of lung: Secondary | ICD-10-CM | POA: Insufficient documentation

## 2018-07-06 DIAGNOSIS — R918 Other nonspecific abnormal finding of lung field: Secondary | ICD-10-CM | POA: Insufficient documentation

## 2018-07-06 LAB — WHOLE BLOOD CREATININE WITH GFR POCT
GFR POCT: >60 mL/min/{1.73_m2} (ref >60)
Whole Blood Creatinine POCT: 0.6 mg/dL (ref 0.5–1.1)

## 2018-07-06 MED ORDER — IODIXANOL 320 MG/ML IV SOLN
100.00 mL | Freq: Once | INTRAVENOUS | Status: AC | PRN
Start: 2018-07-06 — End: 2018-07-06
  Administered 2018-07-06: 100 mL via INTRAVENOUS

## 2018-07-18 ENCOUNTER — Encounter (INDEPENDENT_AMBULATORY_CARE_PROVIDER_SITE_OTHER): Payer: Self-pay | Admitting: Cardiovascular Disease

## 2018-07-18 ENCOUNTER — Ambulatory Visit (INDEPENDENT_AMBULATORY_CARE_PROVIDER_SITE_OTHER): Payer: Medicare Other | Admitting: Cardiovascular Disease

## 2018-07-18 VITALS — BP 127/74 | HR 81 | Temp 98.7°F | Resp 16 | Ht 63.0 in | Wt 163.0 lb

## 2018-07-18 DIAGNOSIS — R072 Precordial pain: Secondary | ICD-10-CM

## 2018-07-18 DIAGNOSIS — E782 Mixed hyperlipidemia: Secondary | ICD-10-CM

## 2018-07-18 DIAGNOSIS — I10 Essential (primary) hypertension: Secondary | ICD-10-CM

## 2018-07-18 NOTE — Progress Notes (Signed)
[facility] OFFICE VISIT      Chief Complaint   Patient presents with   . Follow-up       I had the pleasure of seeing Emily [name] today for cardiovascular follow up. She is a pleasant 72 y.o. female with a history of HTN, HL, palpitations, pulmonary granulomas, who presents for continued management.      She has had intermittent chest pain and back pain for the last week.  This is new.  She denies any aggravating factors.  It is random and occurs mostly at rest.  She feels associated SOB and can have some nausea. No LE edema.  She has L-sided tingling. No recent palpitations. No fevers but + cough.     She had R breast CA.     She had a non-ischemic exercise stress test on 10/04/17. She exercised for 2:36 and achieved 94% of her target heart rate.     MEDICATIONS:     Current Outpatient Medications   Medication Sig Dispense Refill   . acetaminophen (TYLENOL) 500 MG tablet Take 500 mg by mouth as needed for Pain.     Marland Kitchen amLODIPine (NORVASC) 5 MG tablet Take 1 tablet (5 mg total) by mouth daily (Patient taking differently: Take 5 mg by mouth every morning    ) 30 tablet 3   . anastrozole (ARIMIDEX) 1 MG tablet Take 1 tablet (1 mg total) by mouth daily (Patient taking differently: Take 1 mg by mouth every morning    ) 90 tablet 3   . aspirin 81 MG EC tablet Take 81 mg by mouth every morning         . levothyroxine (SYNTHROID, LEVOTHROID) 25 MCG tablet Take 25 mcg by mouth every morning.         Marland Kitchen lisinopril (PRINIVIL,ZESTRIL) 20 MG tablet Take 1 tablet (20 mg total) by mouth daily     . Multiple Vitamins-Minerals (MULTIVITAMIN WITH MINERALS) tablet Take 1 tablet by mouth every morning         . rosuvastatin (CRESTOR) 40 MG tablet Take 40 mg by mouth daily     . vitamin B-1 (THIAMINE) 100 MG tablet Take 100 mg by mouth every morning.       No current facility-administered medications for this visit.        REVIEW OF SYSTEMS: All other systems reviewed and negative except as above.    PHYSICAL  EXAMINATION  Vital Signs: BP 127/74 (BP Site: Right arm, Patient Position: Sitting, Cuff Size: Large)   Pulse 81   Temp 98.7 F (37.1 C)   Resp 16   Ht 1.6 m (5\' 3" )   Wt 73.9 kg (163 lb)   SpO2 96%   BMI 28.87 kg/m    Vital signs reviewed    Wt Readings from Last 3 Encounters:   07/18/18 73.9 kg (163 lb)   06/20/18 73.5 kg (162 lb)   05/14/18 73.3 kg (161 lb 8 oz)        General Appearance:  A well-appearing female in no acute distress.    HEENT: Sclera anicteric, conjunctiva without pallor, moist mucous membranes, normal dentition.   Neck:  Supple without jugular venous distention.  Normal carotid upstrokes without bruits.   Chest: Clear to auscultation bilaterally with good air movement and respiratory effort and no wheezes, rales, or rhonchi   Cardiac: RRR.  Normal S1 and physiologically split S2, without gallops or rub. No murmurs.   Vascular:  2+ carotid, radial pulses  bilaterally  Abdomen: Soft, nontender, nondistended, with normoactive bowel sounds.  No bruits.   Extremities: Warm without edema, clubbing, or cyanosis.   Skin: No rash, warm, appropriate for race.   Neuro: Alert and oriented x3. Grossly intact.  CN II-XII intact.  Normal mood and affect.     ECG:   Independent review shows:  NSR, inferolateral ST-T wave changes    ASSESSMENT/PLAN:    1. Chest pain.  Pain is atypical but she has new EKG changes. Will get nuclear stress test and echo.   2. HTN.  BP reasonably controlled.   3. HL.  On statin.    All patient's questions and concerns regarding cardiovascular disease were answered during this visit.    Orders Placed This Encounter   Procedures   . Lexiscan Nuclear Stress Test   . ECG 12 lead (Normal)   . Transthoracic Echocardiogram (TTE)       Return in about 2 weeks (around 08/01/2018).  F/u with Dr. Judy Pimple, MD  07/18/2018

## 2018-07-19 ENCOUNTER — Encounter (INDEPENDENT_AMBULATORY_CARE_PROVIDER_SITE_OTHER): Payer: Self-pay | Admitting: Cardiovascular Disease

## 2018-08-22 ENCOUNTER — Encounter (INDEPENDENT_AMBULATORY_CARE_PROVIDER_SITE_OTHER): Payer: Self-pay

## 2018-09-13 ENCOUNTER — Ambulatory Visit (INDEPENDENT_AMBULATORY_CARE_PROVIDER_SITE_OTHER): Payer: Medicare Other | Admitting: Cardiology

## 2018-09-13 ENCOUNTER — Encounter (INDEPENDENT_AMBULATORY_CARE_PROVIDER_SITE_OTHER): Payer: Self-pay | Admitting: Cardiology

## 2018-09-13 VITALS — BP 148/73 | HR 75 | Temp 97.8°F | Resp 16 | Ht 63.0 in | Wt 162.0 lb

## 2018-09-13 DIAGNOSIS — I10 Essential (primary) hypertension: Secondary | ICD-10-CM

## 2018-09-13 DIAGNOSIS — R1013 Epigastric pain: Secondary | ICD-10-CM

## 2018-09-13 DIAGNOSIS — E782 Mixed hyperlipidemia: Secondary | ICD-10-CM

## 2018-09-13 MED ORDER — ROSUVASTATIN CALCIUM 40 MG PO TABS
40.00 mg | ORAL_TABLET | Freq: Every day | ORAL | 3 refills | Status: DC
Start: 2018-09-13 — End: 2018-11-14

## 2018-09-13 MED ORDER — AMLODIPINE BESYLATE 5 MG PO TABS
5.00 mg | ORAL_TABLET | Freq: Every day | ORAL | 3 refills | Status: DC
Start: 2018-09-13 — End: 2019-08-14

## 2018-09-13 MED ORDER — LISINOPRIL 20 MG PO TABS
20.00 mg | ORAL_TABLET | Freq: Two times a day (BID) | ORAL | 3 refills | Status: DC
Start: 2018-09-13 — End: 2019-04-17

## 2018-09-13 NOTE — Progress Notes (Signed)
[facility] PROGRESS NOTE    I had the pleasure of seeing Emily [name] today for cardiovascular follow up. She is a pleasant 72 y.o. female with a history of hypertension and hyperlipidemia  with a negative ekg stress test 1/19 and a previous CT chest with calcified granulomas and rt inf lung scarring. The patient denies chest discomfort, shortness of breath, racing heart beat, syncope or near syncope.        MEDICATIONS:  Current Outpatient Medications   Medication Sig Dispense Refill   . acetaminophen (TYLENOL) 500 MG tablet Take 500 mg by mouth as needed for Pain.     Marland Kitchen amLODIPine (NORVASC) 5 MG tablet Take 1 tablet (5 mg total) by mouth daily (Patient taking differently: Take 5 mg by mouth every morning    ) 30 tablet 3   . anastrozole (ARIMIDEX) 1 MG tablet Take 1 tablet (1 mg total) by mouth daily (Patient taking differently: Take 1 mg by mouth every morning    ) 90 tablet 3   . aspirin 81 MG EC tablet Take 81 mg by mouth every morning         . levothyroxine (SYNTHROID, LEVOTHROID) 25 MCG tablet Take 25 mcg by mouth every morning.         Marland Kitchen lisinopril (PRINIVIL,ZESTRIL) 20 MG tablet Take 1 tablet (20 mg total) by mouth daily     . Multiple Vitamins-Minerals (MULTIVITAMIN WITH MINERALS) tablet Take 1 tablet by mouth every morning         . rosuvastatin (CRESTOR) 40 MG tablet Take 40 mg by mouth daily     . vitamin B-1 (THIAMINE) 100 MG tablet Take 100 mg by mouth every morning.           REVIEW OF SYSTEMS: All other systems reviewed and negative except as above.    PHYSICAL EXAMINATION  General Appearance: well-appearing and in no acute distress.   Vital Signs: BP 148/73 (BP Site: Left arm, Patient Position: Sitting, Cuff Size: Medium)   Pulse 75   Temp 97.8 F (36.6 C)   Resp 16   Ht 1.6 m (5\' 3" )   Wt 73.5 kg (162 lb)   SpO2 96%   BMI 28.70 kg/m    HEENT: Sclera anicteric, conjunctiva without pallor, moist mucous membranes.  Neck: Supple without jugular venous distention.  Normal carotid  upstrokes without bruits.  Chest: Crackles bases  Cardiovascular: Normal S1 and  S2 without murmurs, gallops or rub. PMI of normal size and nondisplaced.   Abdomen: Soft, nontender. No organomegaly.  No pulsatile masses or bruits.    Extremities: Warm without edema.     ECG: Normal sinus rhythm, normal EKG on today's tracing.  The October 2019 EKG shows nonspecific lateral T wave change  Past Medical History:   Diagnosis Date   . Breast cancer 2014    right breast mastectomy   . GERD (gastroesophageal reflux disease)    . Hyperlipidemia    . Hypertension    . Hypothyroidism    . Malignant neoplasm of overlapping sites of right female breast 10/19/2015   . Vertigo      Family History   Problem Relation Age of Onset   . Breast cancer Neg Hx      Social History     Socioeconomic History   . Marital status: Widowed     Spouse name: None   . Number of children: None   . Years of education: None   . Highest education  level: None   Occupational History   . None   Social Needs   . Financial resource strain: None   . Food insecurity:     Worry: None     Inability: None   . Transportation needs:     Medical: None     Non-medical: None   Tobacco Use   . Smoking status: Never Smoker   . Smokeless tobacco: Never Used   Substance and Sexual Activity   . Alcohol use: Yes     Alcohol/week: 1.0 standard drinks     Types: 1 Cans of beer per week     Comment: socially   . Drug use: No   . Sexual activity: None   Lifestyle   . Physical activity:     Days per week: None     Minutes per session: None   . Stress: None   Relationships   . Social connections:     Talks on phone: None     Gets together: None     Attends religious service: None     Active member of club or organization: None     Attends meetings of clubs or organizations: None     Relationship status: None   . Intimate partner violence:     Fear of current or ex partner: None     Emotionally abused: None     Physically abused: None     Forced sexual activity: None   Other  Topics Concern   . None   Social History Narrative   . None         IMPRESSION:  Hypertension controlled  Scarring and granulomas of the lung with rales on exam  Hyperlipidemia       PLAN:   Same medication  6 months follow-up      Royann Shivers, MD [facility]  09/13/2018

## 2018-09-14 ENCOUNTER — Encounter (INDEPENDENT_AMBULATORY_CARE_PROVIDER_SITE_OTHER): Payer: Self-pay | Admitting: Cardiology

## 2018-09-19 ENCOUNTER — Encounter (INDEPENDENT_AMBULATORY_CARE_PROVIDER_SITE_OTHER): Payer: Self-pay

## 2018-10-09 ENCOUNTER — Other Ambulatory Visit (INDEPENDENT_AMBULATORY_CARE_PROVIDER_SITE_OTHER): Payer: Self-pay | Admitting: Cardiology

## 2018-10-09 DIAGNOSIS — R002 Palpitations: Secondary | ICD-10-CM

## 2018-10-09 DIAGNOSIS — I10 Essential (primary) hypertension: Secondary | ICD-10-CM

## 2018-10-09 DIAGNOSIS — R1013 Epigastric pain: Secondary | ICD-10-CM

## 2018-10-10 ENCOUNTER — Encounter (INDEPENDENT_AMBULATORY_CARE_PROVIDER_SITE_OTHER): Payer: Self-pay

## 2018-11-13 ENCOUNTER — Emergency Department: Payer: Medicare Other

## 2018-11-13 ENCOUNTER — Observation Stay
Admission: EM | Admit: 2018-11-13 | Discharge: 2018-11-14 | Disposition: A | Discharge status: Home / Self Care | Payer: Medicare Other | Attending: Internal Medicine | Admitting: Internal Medicine

## 2018-11-13 ENCOUNTER — Observation Stay: Payer: Medicare Other

## 2018-11-13 DIAGNOSIS — Z79899 Other long term (current) drug therapy: Secondary | ICD-10-CM | POA: Insufficient documentation

## 2018-11-13 DIAGNOSIS — I10 Essential (primary) hypertension: Secondary | ICD-10-CM | POA: Diagnosis present

## 2018-11-13 DIAGNOSIS — R42 Dizziness and giddiness: Secondary | ICD-10-CM | POA: Insufficient documentation

## 2018-11-13 DIAGNOSIS — I499 Cardiac arrhythmia, unspecified: Secondary | ICD-10-CM

## 2018-11-13 DIAGNOSIS — E039 Hypothyroidism, unspecified: Secondary | ICD-10-CM | POA: Insufficient documentation

## 2018-11-13 DIAGNOSIS — I6782 Cerebral ischemia: Secondary | ICD-10-CM | POA: Insufficient documentation

## 2018-11-13 DIAGNOSIS — Z7982 Long term (current) use of aspirin: Secondary | ICD-10-CM | POA: Insufficient documentation

## 2018-11-13 DIAGNOSIS — E782 Mixed hyperlipidemia: Secondary | ICD-10-CM | POA: Diagnosis present

## 2018-11-13 DIAGNOSIS — C50919 Malignant neoplasm of unspecified site of unspecified female breast: Secondary | ICD-10-CM

## 2018-11-13 DIAGNOSIS — E785 Hyperlipidemia, unspecified: Secondary | ICD-10-CM | POA: Insufficient documentation

## 2018-11-13 DIAGNOSIS — R0789 Other chest pain: Secondary | ICD-10-CM | POA: Insufficient documentation

## 2018-11-13 DIAGNOSIS — R200 Anesthesia of skin: Secondary | ICD-10-CM | POA: Diagnosis present

## 2018-11-13 DIAGNOSIS — K219 Gastro-esophageal reflux disease without esophagitis: Secondary | ICD-10-CM | POA: Insufficient documentation

## 2018-11-13 DIAGNOSIS — I493 Ventricular premature depolarization: Secondary | ICD-10-CM

## 2018-11-13 LAB — PT/INR
PT INR: 0.9 (ref 0.9–1.1)
PT: 12.3 s — ABNORMAL LOW (ref 12.6–15.0)

## 2018-11-13 LAB — TROPONIN I
Troponin I: 0.02 ng/mL (ref 0.00–0.05)
Troponin I: <0.01 ng/mL (ref 0.00–0.05)

## 2018-11-13 LAB — COMPREHENSIVE METABOLIC PANEL
ALT: 16 U/L (ref 0–55)
AST (SGOT): 19 U/L (ref 5–34)
Albumin/Globulin Ratio: 1.4 (ref 0.9–2.2)
Albumin: 3.8 g/dL (ref 3.5–5.0)
Alkaline Phosphatase: 87 U/L (ref 37–106)
Anion Gap: 10 (ref 5.0–15.0)
BUN: 8 mg/dL (ref 7–19)
Bilirubin, Total: 0.3 mg/dL (ref 0.2–1.2)
CO2: 25 mEq/L (ref 22–29)
Calcium: 8.9 mg/dL (ref 7.9–10.2)
Chloride: 109 mEq/L (ref 100–111)
Creatinine: 0.8 mg/dL (ref 0.6–1.0)
Globulin: 2.7 g/dL (ref 2.0–3.6)
Glucose: 104 mg/dL — ABNORMAL HIGH (ref 70–100)
Potassium: 4 mEq/L (ref 3.5–5.1)
Protein, Total: 6.5 g/dL (ref 6.0–8.3)
Sodium: 144 mEq/L (ref 136–145)

## 2018-11-13 LAB — CBC AND DIFFERENTIAL
Absolute NRBC: 0 10*3/uL (ref 0.00–0.00)
Basophils Absolute Automated: 0.04 10*3/uL (ref 0.00–0.08)
Basophils Automated: 0.5 %
Eosinophils Absolute Automated: 0.08 10*3/uL (ref 0.00–0.44)
Eosinophils Automated: 1 %
Hematocrit: 38.8 % (ref 34.7–43.7)
Hgb: 11.7 g/dL (ref 11.4–14.8)
Immature Granulocytes Absolute: 0.03 10*3/uL (ref 0.00–0.07)
Immature Granulocytes: 0.4 %
Lymphocytes Absolute Automated: 2.5 10*3/uL (ref 0.42–3.22)
Lymphocytes Automated: 30.7 %
MCH: 26.4 pg (ref 25.1–33.5)
MCHC: 30.2 g/dL — ABNORMAL LOW (ref 31.5–35.8)
MCV: 87.6 fL (ref 78.0–96.0)
MPV: 11.3 fL (ref 8.9–12.5)
Monocytes Absolute Automated: 0.6 10*3/uL (ref 0.21–0.85)
Monocytes: 7.4 %
Neutrophils Absolute: 4.89 10*3/uL (ref 1.10–6.33)
Neutrophils: 60 %
Nucleated RBC: 0 /100 WBC (ref 0.0–0.0)
Platelets: 291 10*3/uL (ref 142–346)
RBC: 4.43 10*6/uL (ref 3.90–5.10)
RDW: 13 % (ref 11–15)
WBC: 8.14 10*3/uL (ref 3.10–9.50)

## 2018-11-13 LAB — APTT: PTT: 35 s (ref 23–37)

## 2018-11-13 LAB — GFR: EGFR: >60

## 2018-11-13 MED ORDER — ACETAMINOPHEN 325 MG PO TABS
650.00 mg | ORAL_TABLET | Freq: Once | ORAL | Status: AC
Start: 2018-11-13 — End: 2018-11-13
  Administered 2018-11-13: 650 mg via ORAL
  Filled 2018-11-13: qty 2

## 2018-11-13 MED ORDER — ASPIRIN 325 MG PO TABS
325.00 mg | ORAL_TABLET | Freq: Every day | ORAL | Status: AC
Start: 2018-11-13 — End: 2018-11-13
  Administered 2018-11-13: 325 mg via ORAL
  Filled 2018-11-13: qty 1

## 2018-11-13 MED ORDER — ENOXAPARIN SODIUM 40 MG/0.4ML SC SOLN
40.00 mg | Freq: Every day | SUBCUTANEOUS | Status: DC
Start: 2018-11-13 — End: 2018-11-14
  Administered 2018-11-13 – 2018-11-14 (×2): 40 mg via SUBCUTANEOUS
  Filled 2018-11-13 (×2): qty 0.4

## 2018-11-13 MED ORDER — ACETAMINOPHEN 325 MG PO TABS
650.0000 mg | ORAL_TABLET | ORAL | Status: DC | PRN
Start: 2018-11-13 — End: 2018-11-14

## 2018-11-13 MED ORDER — ANASTROZOLE 1 MG PO TABS
1.0000 mg | ORAL_TABLET | Freq: Every day | ORAL | Status: DC
  Administered 2018-11-14: 1 mg via ORAL
  Filled 2018-11-13: qty 1

## 2018-11-13 MED ORDER — ONDANSETRON 4 MG PO TBDP
4.00 mg | ORAL_TABLET | Freq: Four times a day (QID) | ORAL | Status: DC | PRN
Start: 2018-11-13 — End: 2018-11-14

## 2018-11-13 MED ORDER — ROSUVASTATIN CALCIUM 10 MG PO TABS
40.0000 mg | ORAL_TABLET | Freq: Every day | ORAL | Status: DC
Start: 2018-11-14 — End: 2018-11-14
  Administered 2018-11-14: 40 mg via ORAL
  Filled 2018-11-13: qty 4

## 2018-11-13 MED ORDER — ASPIRIN EC 81 MG PO TBEC
81.00 mg | DELAYED_RELEASE_TABLET | Freq: Every morning | ORAL | Status: DC
Start: 2018-11-14 — End: 2018-11-14
  Administered 2018-11-14: 81 mg via ORAL
  Filled 2018-11-13: qty 1

## 2018-11-13 MED ORDER — GADOBUTROL 1 MMOL/ML IV SOLN
10.00 mL | Freq: Once | INTRAVENOUS | Status: AC | PRN
Start: 2018-11-13 — End: 2018-11-13
  Administered 2018-11-13: 10 mmol via INTRAVENOUS

## 2018-11-13 MED ORDER — LEVOTHYROXINE SODIUM 25 MCG PO TABS
25.0000 ug | ORAL_TABLET | Freq: Every day | ORAL | Status: DC
Start: 2018-11-14 — End: 2018-11-14
  Administered 2018-11-14: 25 ug via ORAL
  Filled 2018-11-13: qty 1

## 2018-11-13 MED ORDER — ONDANSETRON HCL 4 MG/2ML IJ SOLN
4.00 mg | Freq: Four times a day (QID) | INTRAMUSCULAR | Status: DC | PRN
Start: 2018-11-13 — End: 2018-11-14

## 2018-11-13 MED ORDER — SIMVASTATIN 40 MG PO TABS
40.0000 mg | ORAL_TABLET | Freq: Every morning | ORAL | Status: DC
Start: 2018-11-14 — End: 2018-11-14
  Administered 2018-11-14: 40 mg via ORAL
  Filled 2018-11-13: qty 1

## 2018-11-13 MED ORDER — ACETAMINOPHEN 650 MG RE SUPP
650.00 mg | RECTAL | Status: DC | PRN
Start: 2018-11-13 — End: 2018-11-14

## 2018-11-13 MED ORDER — NALOXONE HCL 0.4 MG/ML IJ SOLN (WRAP)
0.20 mg | INTRAMUSCULAR | Status: DC | PRN
Start: 2018-11-13 — End: 2018-11-14

## 2018-11-13 NOTE — ED Notes (Signed)
Bed: E07  Expected date:   Expected time:   Means of arrival:   Comments:

## 2018-11-13 NOTE — Progress Notes (Addendum)
Pt arrived on the unit, alert and oriented x4, SR on the monitor. Pt is admitted for numbness and tingling in the right arm and leg. When pt arrived symptoms have resolved. Pt denies pain, NIH on admission is 0. Dysphagia screening completed, no sign of difficulty swallowing. Plan of care is review with pt and stroke education initiated with the handout. Pt have a right limb restriction. Order dinner for pt, will continue to monitor pt.

## 2018-11-13 NOTE — Plan of Care (Signed)
Problem: Day of Admission - Stroke  Goal: Core/Quality measure requirements - Admission  Outcome: Progressing  Flowsheets (Taken 11/13/2018 1850)  Core/Quality measure requirements - Admission: Document NIH Stroke Scale on admission; Document nursing swallow/dysphagia screen on admission. If patient fails, keep patient NPO (follow your hospital protocol on swallowing screening).; VTE Prevention: Ensure anticoagulant(s) administered and/or anti-embolism stockings/devices documented as ordered; Ensure antithrombotic administered or contraindication documented by LIP; Ensure lipid panel ordered; Begin stroke education on admission (must include Modifiable Risk Factors, Warning Signs and Symptoms of Stroke, Activation of Emergency Medical System and Follow-up Appointments) Ensure handout has been given and documented.; Ensure PT/OT and/or SLP ordered

## 2018-11-13 NOTE — H&P (Signed)
ADMISSION HISTORY AND PHYSICAL EXAM    Date Time: 11/13/18 6:11 PM  Patient Name: Emily [name]  Attending Physician: Laretta Bolster, MD  Primary Care Physician: Ramon Dredge, MD    CC: Right sided numbness        Assessment:     Active Hospital Problems    Diagnosis POA   . Right sided numbness Yes   . Mixed hyperlipidemia Yes   . Hypertension Yes      Resolved Hospital Problems   No resolved problems to display.         Plan:     Admit to IMG hospitalist service    Transient right-sided numbness suggestive of TIA   -Continue stroke protocol  -PT/OT/assessment pending  -Hemoglobin A1c and lipid panel pending  -Permissive hypertension Per stroke protocol  -Telemetry monitoring to assess for arrhythmia  -ASA and statin.   -continue stain and crestor.   -Lipid check as above.     Hypothyroidism  -Continue synthroid.     History of Presenting Illness:   Emily [name] is a 73 y.o. female with history of breast CA, hypertension, hyperlipidemia, and hypothyroidism presented to the ED with onset of right-sided numbness on awakening this AM. Denies limb weakness, visual changes, dysarthria, or dysphasia.  Patient had similar symptoms on the left side approximately 6 months ago and ruled out for acute CVA.  MRI imaging is negative for acute stroke and her symptoms have gradually resolved.  Past Medical History:     Past Medical History:   Diagnosis Date   . Breast cancer 2014    right breast mastectomy   . GERD (gastroesophageal reflux disease)    . Hyperlipidemia    . Hypertension    . Hypothyroidism    . Malignant neoplasm of overlapping sites of right female breast 10/19/2015   . Vertigo        Available old records reviewed, including: [facility] Epic records      Past Surgical History:     Past Surgical History:   Procedure Laterality Date   . HYSTERECTOMY     . MASTECTOMY Right 2015       Family History:     Family History   Problem Relation Age of Onset   . Breast cancer Neg Hx        Social History:     Social  History     Tobacco Use   Smoking Status Never Smoker   Smokeless Tobacco Never Used     Social History     Substance and Sexual Activity   Alcohol Use Yes   . Alcohol/week: 1.0 standard drinks   . Types: 1 Cans of beer per week    Comment: socially     Social History     Substance and Sexual Activity   Drug Use No       Allergies:     Allergies   Allergen Reactions   . Caffeine      Personal preference   . Morphine Itching   . Morphine And Related Itching and Nausea And Vomiting     dizzy       Medications:     Prior to Admission medications    Medication Sig Start Date End Date Taking? Authorizing Provider   acetaminophen (TYLENOL) 500 MG tablet Take 500 mg by mouth as needed for Pain.   Yes [provider]   amLODIPine (NORVASC) 5 MG tablet Take 1 tablet (5 mg total) by mouth  daily 09/13/18  Yes Royann Shivers, MD   anastrozole (ARIMIDEX) 1 MG tablet Take 1 tablet (1 mg total) by mouth daily  Patient taking differently: Take 1 mg by mouth every morning     01/19/18  Yes Clent Ridges, MD   aspirin 81 MG EC tablet Take 81 mg by mouth every morning       Yes [provider]   levothyroxine (SYNTHROID, LEVOTHROID) 25 MCG tablet Take 25 mcg by mouth every morning.     12/25/13  Yes [provider]   lisinopril (PRINIVIL,ZESTRIL) 20 MG tablet Take 1 tablet (20 mg total) by mouth 2 (two) times daily 09/13/18  Yes Royann Shivers, MD   Multiple Vitamins-Minerals (MULTIVITAMIN WITH MINERALS) tablet Take 1 tablet by mouth every morning       Yes [provider]   rosuvastatin (CRESTOR) 40 MG tablet Take 1 tablet (40 mg total) by mouth daily 09/13/18  Yes Royann Shivers, MD   simvastatin (ZOCOR) 40 MG tablet TAKE 1 TABLET BY MOUTH ONCE DAILY IN THE MORNING 10/09/18  Yes Royann Shivers, MD   vitamin B-1 (THIAMINE) 100 MG tablet Take 100 mg by mouth every morning.   Yes [provider]       Review of Systems:   Per HPI/PMH as above. All other systems  reviewed and are non contrib  Physical Exam:     Patient Vitals for the past 24 hrs:   BP Temp Temp src Pulse Resp SpO2 Height Weight   11/13/18 1654 - - - - - - 1.6 m (5\' 3" ) -   11/13/18 1648 177/79 97 F (36.1 C) Oral (!) 58 18 100 % 1.6 m (5\' 3" ) 71.8 kg (158 lb 3.2 oz)   11/13/18 1448 - - - - - 100 % - -   11/13/18 1439 - - - - - (!) 83 % - -   11/13/18 1438 - - - - - (!) 85 % - -   11/13/18 1432 - - - - 22 - - -   11/13/18 1430 - - - - - 99 % - -   11/13/18 1427 175/80 - - - - - - -   11/13/18 1426 - - - - 19 - - -   11/13/18 1425 - 97.9 F (36.6 C) Oral (!) 58 18 98 % - -   11/13/18 1320 - - - 60 (!) 32 - - -   11/13/18 1319 - - - - - 99 % - -   11/13/18 1315 - - - - - 98 % - -   11/13/18 1310 - - - - - 99 % - -   11/13/18 1305 - - - - - 98 % - -   11/13/18 1300 - - - - - 98 % - -   11/13/18 1255 - - - - - 98 % - -   11/13/18 1250 - - - - - 98 % - -   11/13/18 1245 - - - - - 98 % - -   11/13/18 1240 - - - - - 98 % - -   11/13/18 1235 - - - - - 98 % - -   11/13/18 1230 - - - - - 98 % - -   11/13/18 1225 - - - - - 98 % - -   11/13/18 1220 - - - - - 98 % - -   11/13/18 1215 - - - - -  98 % - -   11/13/18 1210 - - - - - 98 % - -   11/13/18 1205 - - - - - 98 % - -   11/13/18 1202 - - - - (!) 34 - - -   11/13/18 1200 - - - - - 99 % - -   11/13/18 1159 175/77 - - - - - - -   11/13/18 1157 175/77 98.1 F (36.7 C) Oral 72 17 99 % - 73.6 kg (162 lb 4.1 oz)     Body mass index is 28.02 kg/m.  No intake or output data in the 24 hours ending 11/13/18 1811  Physical Exam:   General appearance - alert, well appearing, and in no distress  Mental status - alert, oriented x 4  HEENT- Normocephalic, atraumatic, non-icteric with pink conjunctiva, moist mucous membranes.  Neck - supple, no lymphadenopathy.   Chest - bilateral breath sounds clear to auscultation. Non labored  Heart -  regular rhythm, normal S1, S2.  Abdomen -obese, soft, nontender, nondistended, with active bowel sounds.  Neurological -  No focal deficets.   Strength 5/5 bilaterally in all extremities. Speech clear, No droop or drift.   Musculoskeletal - no joint tenderness or swelling  Extremities - peripheral pulses present, no edema.  Skin - warm, dry, with no rashes, lesions or bruising.     Labs:     Results     Procedure Component Value Units Date/Time    Troponin I [[id number]] Collected:  11/13/18 1743    Specimen:  Blood Updated:  11/13/18 1748    Troponin I [[id number]] Collected:  11/13/18 1206    Specimen:  Blood Updated:  11/13/18 1241     Troponin I 0.02 ng/mL     APTT [[id number]] Collected:  11/13/18 1206     Updated:  11/13/18 1237     PTT 35 sec     Prothrombin time/INR [[id number]]  (Abnormal) Collected:  11/13/18 1206    Specimen:  Blood Updated:  11/13/18 1236     PT 12.3 sec      PT INR 0.9    Comprehensive metabolic panel [[id number]]  (Abnormal) Collected:  11/13/18 1206    Specimen:  Blood Updated:  11/13/18 1234     Glucose 104 mg/dL      BUN 8 mg/dL      Creatinine 0.8 mg/dL      Sodium 742 mEq/L      Potassium 4.0 mEq/L      Chloride 109 mEq/L      CO2 25 mEq/L      Calcium 8.9 mg/dL      Protein, Total 6.5 g/dL      Albumin 3.8 g/dL      AST (SGOT) 19 U/L      ALT 16 U/L      Alkaline Phosphatase 87 U/L      Bilirubin, Total 0.3 mg/dL      Globulin 2.7 g/dL      Albumin/Globulin Ratio 1.4     Anion Gap 10.0    GFR [[id number]] Collected:  11/13/18 1206     Updated:  11/13/18 1234     EGFR >60.0    CBC and differential [[id number]]  (Abnormal) Collected:  11/13/18 1206    Specimen:  Blood Updated:  11/13/18 1216     WBC 8.14 x10 3/uL      Hgb 11.7 g/dL      Hematocrit 41.6 %      Platelets 291  x10 3/uL      RBC 4.43 x10 6/uL      MCV 87.6 fL      MCH 26.4 pg      MCHC 30.2 g/dL      RDW 13 %      MPV 11.3 fL      Neutrophils 60.0 %      Lymphocytes Automated 30.7 %      Monocytes 7.4 %      Eosinophils Automated 1.0 %      Basophils Automated 0.5 %      Immature Granulocyte 0.4 %      Nucleated RBC 0.0 /100 WBC      Neutrophils Absolute 4.89 x10  3/uL      Abs Lymph Automated 2.50 x10 3/uL      Abs Mono Automated 0.60 x10 3/uL      Abs Eos Automated 0.08 x10 3/uL      Absolute Baso Automated 0.04 x10 3/uL      Absolute Immature Granulocyte 0.03 x10 3/uL      Absolute NRBC 0.00 x10 3/uL           Imaging personally reviewed, including: Chest 2 Views    Result Date: 11/13/2018   No acute cardiopulmonary process. Mitali  Bapna, MD 11/13/2018 2:11 PM    Ct Head Wo Contrast    Result Date: 11/13/2018    No acute intracranial abnormality. Filbert Schilder, MD 11/13/2018 1:43 PM    Mr Angiogram Head Wo Contrast    Result Date: 11/13/2018   . No evidence of high grade vascular narrowing or occlusion.  END OF IMPRESSION  Gustavus Messing, MD 11/13/2018 4:48 PM    Mr Angiogram Neck W Wo Contrast    Result Date: 11/13/2018    Signal loss at the origin of the left vertebral artery is likely artifactual; however, a true stenosis cannot be excluded. Otherwise, no evidence of high grade vascular narrowing or occlusion.  END OF IMPRESSION  Gustavus Messing, MD 11/13/2018 6:02 PM    Mri Brain W Wo Contrast    Result Date: 11/13/2018   1. No evidence of acute intracranial abnormality.  2. Mild chronic small vessel ischemic changes. Gustavus Messing, MD 11/13/2018 4:55 PM        Safety Checklist  DVT prophylaxis:  CHEST guideline (See page e199S) Chemical   Foley: Not present   IVs:  Peripheral IV   PT/OT: Ordered         Signed by: Lynnell Jude, ANP  [id number], Janet Berlin, MD

## 2018-11-13 NOTE — ED Notes (Signed)
Dysphagia screen completed at 1420.

## 2018-11-13 NOTE — ED Notes (Signed)
Moved room 7 to room 5.

## 2018-11-13 NOTE — ED Notes (Signed)
Bed: E05  Expected date:   Expected time:   Means of arrival:   Comments:  Held for 7

## 2018-11-13 NOTE — Consults (Signed)
NEUROLOGY CONSULTATION    Date Time: 11/13/18 4:31 PM  Patient Name: Emily [name]  Attending Physician: Shela Nevin, MD      Assessment & Plan:   Dizziness with right arm and right leg numbness currently doing significantly better symptoms still ongoing unclear cause could be CVA or TIA-like symptoms, could also be due to hypotension itself given extremely high readings when she first came into the hospital 175/77  Prior history of breast cancer  History of high blood pressure  History of hyperlipidemia   Continue aspirin and statin   Wait on MRI findings   If MRI indeed shows new stroke, will order further work-up otherwise I am going to hold off on getting an echo at this point    History of Present Illness:   Patient is a 73 year old lady, comes into the hospital because of new onset of symptoms of dizziness that began this morning in addition to the dizziness she will experience numbness in her right arm and weakness in her right leg her symptoms are significantly improved CT of the head was negative initial blood pressure readings are high at 175/77    Past Medical History:     Past Medical History:   Diagnosis Date   . Breast cancer 2014    right breast mastectomy   . GERD (gastroesophageal reflux disease)    . Hyperlipidemia    . Hypertension    . Hypothyroidism    . Malignant neoplasm of overlapping sites of right female breast 10/19/2015   . Vertigo        Meds:    home meds     Tylenol Norvasc Arimidex Synthroid Zestril multivitamin Zocor thiamine aspirin      Allergies   Allergen Reactions   . Caffeine      Personal preference   . Morphine Itching   . Morphine And Related Itching and Nausea And Vomiting     dizzy       Social & Family History:     Social History     Socioeconomic History   . Marital status: Widowed     Spouse name: Not on file   . Number of children: Not on file   . Years of education: Not on file   . Highest education level: Not on file   Occupational History   . Not on file    Social Needs   . Financial resource strain: Not on file   . Food insecurity:     Worry: Not on file     Inability: Not on file   . Transportation needs:     Medical: Not on file     Non-medical: Not on file   Tobacco Use   . Smoking status: Never Smoker   . Smokeless tobacco: Never Used   Substance and Sexual Activity   . Alcohol use: Yes     Alcohol/week: 1.0 standard drinks     Types: 1 Cans of beer per week     Comment: socially   . Drug use: No   . Sexual activity: Not on file   Lifestyle   . Physical activity:     Days per week: Not on file     Minutes per session: Not on file   . Stress: Not on file   Relationships   . Social connections:     Talks on phone: Not on file     Gets together: Not on file     Attends religious  service: Not on file     Active member of club or organization: Not on file     Attends meetings of clubs or organizations: Not on file     Relationship status: Not on file   . Intimate partner violence:     Fear of current or ex partner: Not on file     Emotionally abused: Not on file     Physically abused: Not on file     Forced sexual activity: Not on file   Other Topics Concern   . Not on file   Social History Narrative   . Not on file       Family History   Problem Relation Age of Onset   . Breast cancer Neg Hx            CODE STATUS: Full code    Review of Systems:   No headache, eye, ear nose, throat problems; no coughing or wheezing or shortness of breath, No chest pain or orthopnea, no abdominal pain, nausea or vomiting, No pain in the body or extremities, no psychiatric, neurological, endocrine, hematological or cardiac complaints except as noted above.     Physical Exam:   Blood pressure 175/80, pulse (!) 58, temperature 97.9 F (36.6 C), temperature source Oral, resp. rate 22, weight 73.6 kg (162 lb 4.1 oz), SpO2 100 %.    HEENT: Normocephalic.no carotid bruits  Lungs:  CTA bil  Abd Soft   Cardiac:  S1,S2, normal rate and rhythm  Neck: supple, no cartoid bruits  Extremities: no  edema  Skin: no rashes seen in exposed areas     Neuro:  Level of consciousness:  Alert and appropriate  Oriented:  X 3  Cognition:  Intact naming, recognition, concentration and following complex commands  Cranial Nerves:  II-XII intact no facial asymmetry, pupils are equal reactive speech was fluent  Strength:  No upper extremity drift, 5/5 strength x 4 extremities  Coordination:  Intact FTN testing  Reflexes:  +2 throughout, down going toes bil  Sensation: Intact x 4 extremities to LT, temp,   Gait:  Deferred     Labs:     Recent Labs   Lab 11/13/18  1206   Glucose 104*   BUN 8   Creatinine 0.8   Calcium 8.9   Sodium 144   Potassium 4.0   Chloride 109   CO2 25   Albumin 3.8   AST (SGOT) 19   ALT 16   Bilirubin, Total 0.3   Alkaline Phosphatase 87     Recent Labs   Lab 11/13/18  1206   WBC 8.14   Hgb 11.7   Hematocrit 38.8   MCV 87.6   MCH 26.4   MCHC 30.2*   Platelets 291         Recent Labs     11/13/18  1206   PTT 35   PT 12.3*   PT INR 0.9          Radiology Results (24 Hour)     Procedure Component Value Units Date/Time    MR Angiogram Neck W WO Contrast [[id number]] Resulted:  11/13/18 1536    Order Status:  Sent Updated:  11/13/18 1536    MR Angiogram Head WO Contrast [[id number]] Resulted:  11/13/18 1535    Order Status:  Sent Updated:  11/13/18 1535    MRI Brain W WO Contrast [[id number]] Resulted:  11/13/18 1534    Order Status:  Sent Updated:  11/13/18 1534  Chest 2 Views [[id number]] Collected:  11/13/18 1409    Order Status:  Completed Updated:  11/13/18 1415    Narrative:       CLINICAL INDICATION: Chest pain    COMPARISON: 06/20/2018    INTERPRETATION: Frontal and lateral views of the chest obtained.  Cardiomegaly. Stable cardiomediastinal contour. Mild scarring at the  right lung base. Lungs otherwise clear. No effusions. Chronic blunting  of the right costophrenic angle compatible with pleural thickening.  Surgical clips in the right anterior chest wall..           Impression:        No acute  cardiopulmonary process.    Mitali  Bapna, MD   11/13/2018 2:11 PM    CT Head WO Contrast [[id number]] Collected:  11/13/18 1342    Order Status:  Completed Updated:  11/13/18 1347    Narrative:       CLINICAL INDICATION:  Right-sided numbness, follow-up stroke    TECHNIQUE:  Helical noncontrast CT images were obtained from the skull  base to the vertex. A combination of automated exposure control,  adjustment of the mA and/or kV according to patient size and/or use of  iterative reconstruction technique was utilized.    COMPARISON:  06/20/2018    INTERPRETATION: Ventricles and sulcal pattern within normal limits for  age. No acute intracranial hemorrhage, extra-axial collection, or  mass-effect. Gray-white differentiation maintained. Mild deep white  matter hypodensity, presumed microvascular ischemic change. Visualized  paranasal sinuses and mastoid air cells clear.          Impression:         No acute intracranial abnormality.    Mitali  Bapna, MD   11/13/2018 1:43 PM           All recent brain and spine imaging (MRI, CT) personally reviewed.    Chart reviewed    Case discussed with: Patient  ED        This note was generated by the Epic EMR system/Speech recognition and may contain inherent errors or omissions not intended by the user. Grammatical errors, random word insertions, deletions and pronoun errors  are occasional consequences of this technology due to software limitations.   Not all errors are caught or corrected. If there are questions or concerns about the content of this note or information contained within the body of this dictation they should be addressed directly with the author for clarification.    Signed by: Cathe Mons, MD  Spectralink: [phone]      Answering Service: [phone]

## 2018-11-13 NOTE — EDIE (Signed)
COLLECTIVE?NOTIFICATION?11/13/2018 11:53?Emily [name], Emily [name]?MRN: [id number]    [place] [facility]'s patient encounter information:   VWU:?[id number]  Account [id number]  Billing Account [id number]      Criteria Met      5 ED Visits in 12 Months    Security and Safety  No recent Security Events currently on file    ED Care Guidelines  There are currently no ED Care Guidelines for this patient. Please check your facility's medical records system.        Prescription Monitoring Program  PDMP query found no report.  Narx Score not available at this time.      E.D. Visit Count (12 mo.)  Facility Visits   [place] [facility] 5   Total 5   Note: Visits indicate total known visits.      Recent Emergency Department Visit Summary  Date Facility [facility] Type Diagnoses or Chief Complaint   Nov 13, 2018 [place] [facility]      CHEST PAIN, ARM TINGLING      Jun 20, 2018 [place] [facility] [place] [facility]      fell at home, injuring head, face, right knee, feels lightheaded      Fall      Other chest pain      Fall (on)(from) sidewalk curb, initial encounter      Pain in right knee      Pain in right shoulder      Other injury of unspecified body region, initial encounter      May 14, 2018 [place] [facility]      dizziness, sycope      Extremity Weakness      Dizziness      Weakness      Apr 03, 2018 [place] [facility] [place] [facility] [facility]      medic      Generalized weakness      Nausea      Dizziness and giddiness      Unspecified nystagmus      Weakness      Feb 07, 2018 [place] [facility] [place] [facility]      Triage-Weakness      Generalized Body Aches      Acute bronchitis, unspecified          Recent Inpatient Visit Summary  Date Facility [facility] Type Diagnoses or Chief Complaint   Apr 03, 2018 [place] [facility] [place] [facility]      Unspecified nystagmus      Dizziness and giddiness      Weakness       Nausea          Care Team  Provider Specialty Phone Fax Service Dates   DAVIDSON, Janet Berlin MD M, MD Family Medicine: Adult Medicine   Current      Collective Portal  This patient has registered at the [facility] Emergency Department   For more information visit: [email or web address]     PLEASE NOTE:     1.   Any care recommendations and other clinical information are provided as guidelines or for historical purposes only, and providers should exercise their own clinical judgment when providing care.    2.   You may only use this information for purposes of treatment, payment or health care operations activities, and subject to the limitations of applicable [employer]  Policies.    3.   You should consult directly with the organization that provided a care guideline or other clinical history with any questions about additional information or accuracy or completeness of information provided.    ? 2020 [employer] - [email or web address]/

## 2018-11-14 ENCOUNTER — Encounter (INDEPENDENT_AMBULATORY_CARE_PROVIDER_SITE_OTHER): Payer: Self-pay

## 2018-11-14 LAB — LIPID PANEL
Cholesterol / HDL Ratio: 3.4
Cholesterol: 181 mg/dL (ref 0–199)
HDL: 53 mg/dL (ref 40–9999)
LDL Calculated: 112 mg/dL — ABNORMAL HIGH (ref 0–99)
Triglycerides: 81 mg/dL (ref 34–149)
VLDL Calculated: 16 mg/dL (ref 10–40)

## 2018-11-14 LAB — HEMOGLOBIN A1C
Average Estimated Glucose: 119.8 mg/dL
Hemoglobin A1C: 5.8 % (ref 4.6–5.9)

## 2018-11-14 LAB — HEMOLYSIS INDEX: Hemolysis Index: 9 (ref 0–18)

## 2018-11-14 MED ORDER — ROSUVASTATIN CALCIUM 40 MG PO TABS
40.0000 mg | ORAL_TABLET | Freq: Every day | ORAL | 0 refills | Status: DC
Start: 2018-11-14 — End: 2019-04-15

## 2018-11-14 MED ORDER — ROSUVASTATIN CALCIUM 40 MG PO TABS
40.0000 mg | ORAL_TABLET | Freq: Every day | ORAL | 0 refills | Status: DC
Start: 2018-11-14 — End: 2018-11-14

## 2018-11-14 NOTE — Progress Notes (Signed)
11/14/2018  Situation Dizziness with right arm right leg numbness all symptoms resolved not sure about cause, brain MRI is negative   Background ADLs independent/   Assessment Symptoms resolving- seen by neurologist   Recommendation DCP- homewith potentially no needs     Jari Favre, BSN,RN,ACM-RN  Case Manager PRN  [phone]

## 2018-11-14 NOTE — PT Eval Note (Signed)
[facility]  [address]  [place], [place] [address]  [phone]    Physical Therapy Evaluation    Patient: Emily [name] MRN: [id number]   Unit: [facility] INTERMEDIATE CARE Bed: MI623/MI623-01    Time of Treatment: Time Calculation  PT Received On: 11/14/18  Start Time: 1010  Stop Time: 1019  Time Calculation (min): 9 min    Consult received for Emily [name] for PT evaluation and treatment.  Patient's medical condition is appropriate for Physical Therapy  intervention at this time.    Interpreter utilized: no, not indicated      D/C Suggestions   Based on today's performance:  Recommendation  Discharge Recommendation: Home with no needs  DME Recommended for Discharge: (no DME needs)  PT Frequency: one time visit - therapy discontinued.      Transport Recommendations: no limitations    Discharge recommendations are based on patient's progression/regression. Please see most recent note for updated discharge recommendations.    Assessment     Emily [name] is a 73 y.o. female admitted 11/13/2018.  Pt was admitted with new onset R sided numbness/tingling. Pt reports all symptoms have resolved. Pt completed bed mobility, transfers and gait x 300' independently. Pt also ascended/descended a flight of stairs with hand rail modified independently. Pt is at her baseline and has no further acute PT needs. D/C PT.           Complexity Level Hx and Co  morbidites Examination Clinical Decision Making Clinical Presentation   Low  no impact 1-2 elements Limited options Stable       PMP - Progressive Mobility Protocol   PMP Activity: Step 7 - Walks out of Room  Distance Walked (ft) (Step 6,7): 300 Feet          Interdisciplinary Communication: Discussed with RN/OT      Plan     Plan  Risks/Benefits/POC Discussed with Pt/Family: With patient  Treatment/Interventions: No skilled interventions needed at this time  PT Frequency: one time visit - therapy discontinued         Medical Diagnosis: Right sided numbness  [R20.0]    History of Present Illness: Emily [name] is a 73 y.o. female admitted on  11/13/2018 with "history of breast CA, hypertension, hyperlipidemia, and hypothyroidism presented to the ED with onset of right-sided numbness on awakening thisAM. Denieslimb weakness, visual changes, dysarthria, or dysphasia. Patient had similar symptoms on the left side approximately 6 months ago and ruled out for acute CVA. MRI imaging is negative for acute stroke and her symptoms have gradually resolved."      Patient Active Problem List   Diagnosis   . Malignant neoplasm of overlapping sites of right female breast   . Hypertension   . Mixed hyperlipidemia   . Hypothyroid   . Right sided numbness     Past Medical History:   Diagnosis Date   . Breast cancer 2014    right breast mastectomy   . GERD (gastroesophageal reflux disease)    . Hyperlipidemia    . Hypertension    . Hypothyroidism    . Malignant neoplasm of overlapping sites of right female breast 10/19/2015   . Vertigo      Past Surgical History:   Procedure Laterality Date   . HYSTERECTOMY     . MASTECTOMY Right 2015       X-Rays/Tests/Labs:  Lab Results   Component Value Date/Time    HGB 11.7 11/13/2018 12:06 PM  HCT 38.8 11/13/2018 12:06 PM    K 4.0 11/13/2018 12:06 PM    NA 144 11/13/2018 12:06 PM    INR 0.9 11/13/2018 12:06 PM    TROPI <0.01 11/13/2018 05:43 PM    TROPI 0.02 11/13/2018 12:06 PM    TROPI <0.01 05/14/2018 10:24 AM    TROPI <0.01 04/03/2018 01:52 PM    TROPI 0.01 07/15/2009 09:40 AM     MRI Brain W WO Contrast (Order: [id number]) - 11/13/2018   IMPRESSION:   1. No evidence of acute intracranial abnormality.   2. Mild chronic small vessel ischemic changes.    MR Angiogram Neck W WO Contrast (Order: [id number]) - 11/13/2018   IMPRESSION:   Signal loss at the origin of the left vertebral artery is  likely artifactual; however, a true stenosis cannot be excluded.   Otherwise, no evidence of high grade vascular narrowing or occlusion.     Chest 2  Views (Order: [id number]) - 11/13/2018   IMPRESSION:   No acute cardiopulmonary process.    Social History:  Lives alonein a apartment.  Entry Steps: 1 flight, no elevator accessRails: yesInside steps: 0Rails: 0  Equipment at home: tub/shower combination  Prior Level of Function: indep all ADL/IADL, no DME/AE at home, cooks, drives  Cognition: WFL  Mobility: independent  Feeding: independent  Grooming: independent  Bathing: independent  Dressing: independent  Toileting: independent    Subjective   Patient is agreeable to participation in the therapy session. Nursing clears patient for therapy.  Patient's Goal:  To go home today  Pain: pt denies pain      Objective     Precautions/ Contraindications:   Precautions  Weight Bearing Status: no restrictions    Patient is in bed with  Telemetry and Intravenous Access in place.        Observation of patient/vitals:      Vitals:    11/13/18 2357 11/14/18 0404 11/14/18 0756 11/14/18 1235   BP: 127/61 135/80 123/69 154/81   Pulse: 60 63 (!) 57 63   Resp: 18 18 20 18    Temp: 98.2 F (36.8 C) 97.7 F (36.5 C) 97.3 F (36.3 C) 98.2 F (36.8 C)   TempSrc: Oral Oral Oral Oral   SpO2: 99% 99% 98% 100%   Weight:       Height:           Orientation/Cognition:  Alert and Oriented x 4  Cognition: WFL    Musculoskeletal Examination:      ROM Strength   Neck/ Trunk WFL WFL   RLE WFL WFL   LLE [facility] WFL       Sensation: Intact to light touch, denies numbness/tingling to BLEs   Coordination: Intact to gross motor to BLEs       Functional Mobility:  Rolling: indep    Supine to sit: indep  Scooting: indep  Sit to Supine: indep  Sit to stand: indep  Stand to sit: indep  Transfers: indep  Ambulation:     Weightbearing: no restrictions   Assistance level: indep   Distance: 300'   Assistive Device: none   Gait Deviations: steady, reciprocal gait   Stairs: mod indep up/down full flight with 1  rail    Balance:  Static Sit: good  Dynamic Sit: good  Static Stand: good  Dynamic Stand: good    Endurance: good    Participation:  good    Education:  Educated the patient to role of physical therapy, plan of care, goals  of therapy and safety with mobility and ADLs.    Patient is in bed with all needs met, and equipment intact.  RN notified of session outcome.  Safety measures include: handoff to nurse/clin tech/ unit secretary completed, oriented to call bell and placed within reach, personal items within reach and bed placed in lowest position.  Mobility status posted at bedside and within E.M.R.    AM-PACT Inpatient Short Forms  Inpatient AM-PACT Performed? (PT): Basic Mobility Inpatient Short Form   AM-PACT "6 Clicks" Basic Mobility Inpatient Short Form  Turning Over in Bed: None  Sitting Down On/Standing From Armchair: None  Lying on Back to Sitting on Side of Bed: None  Assist Moving to/from Bed to Chair: None  Assist to Walk in Hospital Room: None  Assist to Climb 3-5 Steps with Railing: None  PT Basic Mobility Raw Score: 24  CMS 0-100% Score: 0.00%      Goals  Goal Formulation: With patient(pt has no PT needs; D/C PT)       Signature:  Sedonia Small, PT  11/14/2018  2:34 PM  Phone: [phone]    (For scheduling questions, please contact rehab tech [phone])    Attention MD:   Thank you for allowing [place] to participate in the care of [facility]. Regulations from the [employer] for Medicare and Medicaid Services (CMS) require your review and approval of this plan of care.     Please cosign this note indicating you are in agreement with theTherapy Plan of Care so we may initiate the therapy treatment plan.

## 2018-11-14 NOTE — Discharge Summary (Signed)
MEDICINE DISCHARGE SUMMARY    Date Time: 11/14/18 1:55 PM  Patient Name: Emily [name]  Attending Physician: Laretta Bolster, MD  Primary Care Physician: Ramon Dredge, MD    Date of Admission: 11/13/2018  Date of Discharge:  11/14/2018    Discharge Diagnoses:     Principal Diagnosis (Diagnosis after study, that is chiefly responsible for admission to inpatient status): <principal problem not specified>  Active Hospital Problems    Diagnosis POA   . Right sided numbness Yes   . Mixed hyperlipidemia Yes   . Hypertension Yes      Resolved Hospital Problems   No resolved problems to display.       Disposition:      Home with family    Pending Results, Recommendations & Instructions to providers after discharge:     1. Micro / Labs / Path pending:   [employer]     None          Recent Labs - Last 2:       Recent Labs   Lab 11/13/18  1206   WBC 8.14   Hgb 11.7   Hematocrit 38.8   Platelets 291       Recent Labs   Lab 11/13/18  1206   PT 12.3*   PT INR 0.9   PTT 35     Recent Labs   Lab 11/13/18  1206   Alkaline Phosphatase 87   Bilirubin, Total 0.3   Protein, Total 6.5   Albumin 3.8   ALT 16   AST (SGOT) 19      Recent Labs   Lab 11/13/18  1206   Sodium 144   Potassium 4.0   Chloride 109   CO2 25   BUN 8   Glucose 104*   Calcium 8.9       Recent Labs   Lab 11/14/18  0541   Cholesterol 181   Triglycerides 81   HDL 53   LDL Calculated 112*           Invalid input(s): FREET4         Procedures/Radiology performed:   Radiology: all results from this admission  Chest 2 Views    Result Date: 11/13/2018   No acute cardiopulmonary process. Mitali  Bapna, MD 11/13/2018 2:11 PM    Ct Head Wo Contrast    Result Date: 11/13/2018    No acute intracranial abnormality. Filbert Schilder, MD 11/13/2018 1:43 PM    Mr Angiogram Head Wo Contrast    Result Date: 11/13/2018   . No evidence of high grade vascular narrowing or occlusion.  END OF IMPRESSION  Gustavus Messing, MD 11/13/2018 4:48 PM    Mr Angiogram Neck W Wo Contrast    Result Date:  11/13/2018    Signal loss at the origin of the left vertebral artery is likely artifactual; however, a true stenosis cannot be excluded. Otherwise, no evidence of high grade vascular narrowing or occlusion.  END OF IMPRESSION  Gustavus Messing, MD 11/13/2018 6:02 PM    Mri Brain W Wo Contrast    Result Date: 11/13/2018   1. No evidence of acute intracranial abnormality.  2. Mild chronic small vessel ischemic changes. Gustavus Messing, MD 11/13/2018 4:55 PM       Hospital Course:     Reason for admission/ HPI:    From H&P: 73 y.o. female with history of breast CA, hypertension, hyperlipidemia, and hypothyroidism presented to the ED with onset of  right-sided numbness on awakening this AM. Denies limb weakness, visual changes, dysarthria, or dysphasia.  Patient had similar symptoms on the left side approximately 6 months ago and ruled out for acute CVA.  MRI imaging is negative for acute stroke and her symptoms have gradually resolved.    Hospital Course:   Patient was admitted and seen by Neurologist.  Transient right-sided numbness suggestive of TIA   -stroke protocol  -PT/OT  -Permissive hypertension initially  -ASA and statin.   -continue stain and crestor.     Hypothyroidism  -Continue synthroid.     Discharge Day Exam:  Temp:  [97 F (36.1 C)-98.2 F (36.8 C)] 98.2 F (36.8 C)  Heart Rate:  [57-76] 63  Resp Rate:  [18-22] 18  BP: (123-177)/(61-81) 154/81  General appearance - alert, well appearing, and in no distress  Mental status - alert, oriented x 4  HEENT- Normocephalic, atraumatic, non-icteric with pink conjunctiva, moist mucous membranes.  Neck - supple, no lymphadenopathy.   Chest - bilateral breath sounds clear to auscultation. Non labored  Heart -  regular rhythm, normal S1, S2.  Abdomen -obese, soft, nontender, nondistended, with active bowel sounds.  Neurological -  No focal deficets.  Strength 5/5 bilaterally in all extremities. Speech clear, No droop or drift.   Musculoskeletal - no joint tenderness or  swelling  Extremities - peripheral pulses present, no edema.  Skin - warm, dry, with no rashes, lesions or bruising.     Wounds/decutibus ulcers/stage:    Consultations:     Treatment Team:   Attending Provider: Laretta Bolster, MD  Consulting Physician: Cathe Mons, MD    Discharge Condition:     stable    Discharge Instructions & Follow Up Plan for Patient:     Diet: cardiac    Activity/Weight Bearing Status: as tol    Patient was instructed to follow up with:     Follow-up Information     Ramon Dredge, MD Follow up in 1 week(s).    Specialty:  Family Medicine  Contact information:  [address]  [place] [address]  [phone]             Neurologist Follow up.    Why:  as per Dr. Francesco Sor               Discharge Code Status: full    Complete instructions and follow up are in the patient's After Visit Summary    Minutes spent coordinating discharge and reviewing discharge plan: 38 minutes    Discharge Medications:        Medication List      CHANGE how you take these medications    anastrozole 1 MG tablet  Commonly known as:  ARIMIDEX  Take 1 tablet (1 mg total) by mouth daily  What changed:  when to take this        CONTINUE taking these medications    amLODIPine 5 MG tablet  Commonly known as:  NORVASC  Take 1 tablet (5 mg total) by mouth daily     aspirin 81 MG EC tablet     levothyroxine 25 MCG tablet  Commonly known as:  SYNTHROID, LEVOTHROID     lisinopril 20 MG tablet  Commonly known as:  PRINIVIL,ZESTRIL  Take 1 tablet (20 mg total) by mouth 2 (two) times daily     multivitamin with minerals tablet     rosuvastatin 40 MG tablet  Commonly known as:  CRESTOR  Take 1 tablet (40 mg total) by mouth daily     simvastatin 40 MG tablet  Commonly known as:  ZOCOR  TAKE 1 TABLET BY MOUTH ONCE DAILY IN THE MORNING     vitamin B-1 100 MG tablet  Commonly known as:  THIAMINE        STOP taking these medications    acetaminophen 500 MG tablet  Commonly known as:  TYLENOL            [facility]  Department of Medicine  P: [phone]  F: [phone]    Signed by: Laretta Bolster, MD    CC: Ramon Dredge, MD

## 2018-11-14 NOTE — Progress Notes (Signed)
Date Time: 11/14/18 12:32 PM  Patient Name: Emily [name]  Attending Physician: Laretta Bolster, MD  Patient Class: Observation  Hospital Day: 0            NEUROLOGY PROGRESS NOTE             Assessment/Plan   Dizziness with right arm right leg numbness all symptoms resolved not sure about cause, brain MRI is negative      Outpatient follow-up, for possible C-spine MRI  Okay to discharge from my standpoint in light of negative brain MRI  Remain on aspirin and statin  Discussed with primary care team      Subjective:   Patient Seen and Examined.  Lying in bed awake alert speech fluent moving both arms and legs well back to normal  Review of Systems:   No headache, eye, ear nose, throat problems; no coughing or wheezing or shortness of breath, No chest pain or orthopnea, no abdominal pain, nausea or vomiting, No pain in the body or extremities, no psychiatric, neurological, endocrine, hematological or cardiac complaints except as noted above.     Physical Exam:   BP 123/69   Pulse (!) 57   Temp 97.3 F (36.3 C) (Oral)   Resp 20   Ht 1.6 m (5\' 3" )   Wt 71.8 kg (158 lb 3.2 oz)   SpO2 98%   BMI 28.02 kg/m   Awake alert oriented x3 speech fluent smile slightly asymmetric moving both arms and legs well    Meds:      Scheduled Meds: PRN Meds:    anastrozole, 1 mg, Oral, Daily  aspirin, 81 mg, Oral, QAM  enoxaparin, 40 mg, Subcutaneous, Daily  levothyroxine, 25 mcg, Oral, Daily at 0600  rosuvastatin, 40 mg, Oral, Daily  simvastatin, 40 mg, Oral, QAM        Continuous Infusions:   acetaminophen, 650 mg, Q4H PRN    Or  acetaminophen, 650 mg, Q4H PRN  naloxone, 0.2 mg, PRN  ondansetron, 4 mg, Q6H PRN    Or  ondansetron, 4 mg, Q6H PRN                Labs:     Recent Labs   Lab 11/13/18  1206   Glucose 104*   BUN 8   Creatinine 0.8   Calcium 8.9   Sodium 144   Potassium 4.0   Chloride 109   CO2 25   Albumin 3.8   AST (SGOT) 19   ALT 16   Bilirubin, Total 0.3   Alkaline Phosphatase 87     Recent Labs   Lab 11/13/18  1206    WBC 8.14   Hgb 11.7   Hematocrit 38.8   MCV 87.6   MCH 26.4   MCHC 30.2*   Platelets 291         Recent Labs     11/13/18  1206   PTT 35   PT 12.3*   PT INR 0.9          Radiology Results (24 Hour)     Procedure Component Value Units Date/Time    MR Angiogram Neck W WO Contrast [[id number]] Collected:  11/13/18 1704    Order Status:  Completed Updated:  11/13/18 1806    Narrative:       INDICATION: right sided numbness    COMPARISON: None.    TECHNIQUE: Time of flight and infusion MRA of the neck vessels.   10 mL  Gadavist IV contrast administered.  FINDINGS:    Signal loss at the origin of the left vertebral artery is likely  artifactual; however, a true stenosis cannot be excluded. Bilateral  vertebral arteries are otherwise without high grade focal narrowing or  occlusion.       Common and internal carotid arteries are without high grade narrowing or  occlusion.         Impression:         Signal loss at the origin of the left vertebral artery is  likely artifactual; however, a true stenosis cannot be excluded.   Otherwise, no evidence of high grade vascular narrowing or occlusion.      END OF IMPRESSION       Gustavus Messing, MD   11/13/2018 6:02 PM    MRI Brain W WO Contrast [[id number]] Collected:  11/13/18 1650    Order Status:  Completed Updated:  11/13/18 1659    Narrative:       INDICATION: right sided numbness     TECHNIQUE: Multiplanar, multisequence MR imaging of the brain was  performed before and after 10 mL Gadavist IV contrast administration.     COMPARISON: 04/18/2014.    FINDINGS:     BRAIN: The ventricles and sulci are normal in size and configuration.  Age cortical involutional changes present. There is no acute infarct or  intracerebral hemorrhage. No extra-axial blood or fluid collection is  present. Scattered foci of T2 prolongation demonstrated within the  periventricular and subcortical white matter compatible with mild  chronic small vessel ischemic change. No intracranial mass  is  identified. No abnormal enhancement demonstrated. There is no midline  shift or edema. Partial empty sella noted.    Orbits are unremarkable.No paranasal sinus air fluid levels  demonstrated.  Mastoid air cells within normal limits.        Impression:          1. No evidence of acute intracranial abnormality.    2. Mild chronic small vessel ischemic changes.    Gustavus Messing, MD   11/13/2018 4:55 PM    MR Angiogram Head WO Contrast [[id number]] Collected:  11/13/18 1642    Order Status:  Completed Updated:  11/13/18 1652    Narrative:       INDICATION: right sided numbness    COMPARISON: None.    TECHNIQUE:  Time of flight MRA of the circle of Willis was performed    FINDINGS:    MRA:  The ACA, MCA, and PCA vessels are without high grade focal  narrowing or occlusion.   No aneurysm is appreciated.  Basilar artery is  widely patent without significant stenosis. 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      Impression:        . No evidence of high grade vascular narrowing or occlusion.       END OF IMPRESSION       Gustavus Messing, MD   11/13/2018 4:48 PM    Chest 2 Views [[id number]] Collected:  11/13/18 1409    Order Status:  Completed Updated:  11/13/18 1415    Narrative:       CLINICAL INDICATION: Chest pain    COMPARISON: 06/20/2018    INTERPRETATION: Frontal and lateral views of the chest obtained.  Cardiomegaly. Stable cardiomediastinal contour. Mild scarring at the  right lung base. Lungs otherwise clear. No effusions. Chronic blunting  of the right costophrenic angle compatible with pleural thickening.  Surgical clips  in the right anterior chest wall..           Impression:        No acute cardiopulmonary process.    Mitali  Bapna, MD   11/13/2018 2:11 PM    CT Head WO Contrast [[id number]] Collected:  11/13/18 1342    Order Status:  Completed Updated:  11/13/18 1347    Narrative:       CLINICAL INDICATION:  Right-sided numbness, follow-up  stroke    TECHNIQUE:  Helical noncontrast CT images were obtained from the skull  base to the vertex. A combination of automated exposure control,  adjustment of the mA and/or kV according to patient size and/or use of  iterative reconstruction technique was utilized.    COMPARISON:  06/20/2018    INTERPRETATION: Ventricles and sulcal pattern within normal limits for  age. No acute intracranial hemorrhage, extra-axial collection, or  mass-effect. Gray-white differentiation maintained. Mild deep white  matter hypodensity, presumed microvascular ischemic change. Visualized  paranasal sinuses and mastoid air cells clear.          Impression:         No acute intracranial abnormality.    Mitali  Bapna, MD   11/13/2018 1:43 PM           All brain imaging (MRI, CT) personally reviewed.    Case discussed with: pt dr Deforest Hoyles       This note was generated by the Epic EMR system/ Dragon speech recognition and may contain inherent errors or omissions not intended by the user. Grammatical errors, random word insertions, deletions and pronoun errors  are occasional consequences of this technology due to software limitations.     Not all errors are caught or corrected. If there are questions or concerns about the content of this note or information contained within the body of this dictation they should be addressed directly with the author for clarification.      Signed by: Cathe Mons, MD  Spectralink: [id number]      Answering Service: [phone]

## 2018-11-14 NOTE — Plan of Care (Signed)
Patient requesting refill on Crestor. She is already on Zocor 40 mg daily.  Will provide only 30 day supply and she is advised to follow up with her PMD in 1 week.    KA

## 2018-11-14 NOTE — Plan of Care (Signed)
Patient AOX4. No complaints of pain. Patient no longer on stroke pathway. Neuro status WNL, no numbness or dizziness. MRI negative. Patient cleared for discharge. Receiving lovenox for VTE prophylaxis. Worked with PT/OT/ST. VSS, receiving home medications for BP, cholesterol control. Discussed stroke prevention with patient.     Problem: Safety  Goal: Patient will be free from injury during hospitalization  Outcome: Progressing  Flowsheets (Taken 11/14/2018 1406)  Patient will be free from injury during hospitalization : Assess patient's risk for falls and implement fall prevention plan of care per policy; Provide and maintain safe environment; Use appropriate transfer methods; Ensure appropriate safety devices are available at the bedside; Include patient/ family/ care giver in decisions related to safety; Hourly rounding     Problem: Hemodynamic Status: Cardiac  Goal: Stable vital signs and fluid balance  Outcome: Progressing  Flowsheets (Taken 11/14/2018 1406)  Stable vital signs and fluid balance: Monitor/assess vital signs and telemetry per unit protocol; Weigh on admission and record weight daily; Assess signs and symptoms associated with cardiac rhythm changes     Problem: Day of Discharge - Stroke  Goal: Able to express understanding of discharge instructions and stroke education  Outcome: Completed  Goal: Core/Quality measures - Discharge  Outcome: Completed

## 2018-11-14 NOTE — UM Notes (Signed)
11/13/2018  HPI  Emily [name] a 73 y.o. female with history of breast CA, hypertension, hyperlipidemia, and hypothyroidism presented to the ED with onset of right-sided numbness on awakening this AM, Patient denies limb weakness, visual changes, dysarthria, , or dysphasia. Pt had similar symptoms on the right side 6 months ago and R/O for acute CVA. MRI imaging negative for acute stroke and symptoms have gradually resolved.    VS  175/77, 72, 17, 98.1, 99%    MR Angiogram Neck W WO Contrast   Signal loss at the origin of the left vertebral artery is  likely artifactual; however, a true stenosis cannot be excluded. Otherwise, no evidence of high grade vascular narrowing or occlusion.      MRI Brain W WO Contrast    1 .No evidence of acute intracranial abnormality.    2. Mild chronic small vessel ischemic changes.      MR Angiogram Head WO Contrast   No evidence of high grade vascular narrowing or occlusion.    Chest 2 Views   No acute cardiopulmonary process.    CT Head WO Contrast   No acute intracranial abnormality.      Medication Administration from 11/13/2018 1152 to 11/13/2018 1634      Date/Time Order Dose Route Action    11/13/2018 1426 acetaminophen (TYLENOL) tablet 650 mg 650 mg Oral Given    11/13/2018 1504 aspirin tablet 325 mg 325 mg Oral Given       PLAN    Place in Observation Services (Order [id number])     CC: Right sided numbness    ? Continue aspirin and statin  ? Wait on MRI findings  ? If MRI indeed shows new stroke, will order further work-up otherwise I am going to hold off on getting an echo at this point      Transient right-sided numbness suggestive of TIA   -Continue stroke protocol  -PT/OT/assessment pending  -Hemoglobin A1c and lipid panel pending  -Permissive hypertension Per stroke protocol  -Telemetry monitoring to assess for arrhythmia  -ASA and statin.   -continue stain and crestor.   -Lipid check as above.     Hypothyroidism  -Continue synthroid.

## 2018-11-14 NOTE — Plan of Care (Signed)
Seen and examined the patient.  Doing well with complete resolution of symptoms.  MRI brain neg.  Cleared by Neuro for discharge.    KA

## 2018-11-14 NOTE — Plan of Care (Signed)
The learning abilities of the patient and/or caregiver have been assessed. Today's individualized plan of care includes fall prevention, pain management and neuro check and vital signs every four hours. The patient or caregiver states the following personal goal related to the patient's deficit(s): "By discharge I want to be able to go home without tight sided weakness  Problem: Safety  Goal: Patient will be free from injury during hospitalization  Outcome: Progressing  Flowsheets (Taken 11/14/2018 0430)  Patient will be free from injury during hospitalization : Ensure appropriate safety devices are available at the bedside; Provide and maintain safe environment; Use appropriate transfer methods; Assess patient's risk for falls and implement fall prevention plan of care per policy; Hourly rounding     Problem: Every Day - Stroke  Goal: Core/Quality measure requirements - Daily  Outcome: Progressing  Flowsheets (Taken 11/14/2018 0430)  Core/Quality measure requirements - Daily : Once lipid panel has resulted, check LDL. Contact provider for statin order if LDL > 70 (or ensure contraindication documented by LIP).; Ensure antithrombotic administered or contraindication documented by LIP by end of day 2; VTE Prevention: Ensure anticoagulant(s) administered and/or anti-embolism stockings/devices documented by end of day 2; Continue stroke education (must include Modifiable Risk Factors, Warning Signs and Symptoms of Stroke, Activation of Emergency Medical System and Follow-up Appointments). Ensure handout has been given and documented.  Goal: Neurological status is stable or improving  Outcome: Progressing  Flowsheets (Taken 11/14/2018 0430)  Neurological status is stable or improving: Monitor/assess NIH Stroke Scale; Re-assess NIH Stroke Scale for any change in status; Observe for seizure activity and initiate seizure precautions if indicated  Goal: Stable vital signs and fluid balance  Outcome: Progressing  Flowsheets  (Taken 11/14/2018 0430)  Stable vital signs and fluid balance : Monitor intake and output. Notify LIP if urine output is < 30 mL/hour.; Monitor and assess vitals every 4 hours or as ordered and hemodynamic parameters; Apply telemetry monitor as ordered; Encourage oral fluid intake  Goal: Patient will maintain adequate oxygenation  Outcome: Progressing  Goal: Patient's risk of aspiration will be minimized  Outcome: Progressing  Goal: Nutritional intake is adequate  Outcome: Progressing  Flowsheets (Taken 11/14/2018 0430)  Nutritional intake is adequate: Encourage/administer dietary supplements as ordered (i.e. tube feed, TPN, oral, OGT/NGT, supplements); Allow adequate time for meals; Encourage/perform oral hygiene as appropriate; Include patient/patient care companion in decisions related to nutrition  Goal: Elimination patterns are normal or improving  Outcome: Progressing  Flowsheets (Taken 11/14/2018 0430)  Elimination patterns are normal or improving: Assess for normal bowel sounds; Monitor for abdominal distension  Goal: Mobility/Activity is maintained at optimal level for patient  Outcome: Progressing  Flowsheets (Taken 11/14/2018 0430)  Mobility/activity is maintained at optimal level for patient: Perform active/passive ROM; Plan activities to conserve energy, plan rest periods; Maintain proper body alignment; Encourage independent activity per ability; Reposition patient every 2 hours and as needed unless able to reposition self  Goal: Skin integrity is maintained or improved  Outcome: Progressing  Flowsheets (Taken 11/14/2018 0430)  Skin integrity is maintained or improved: Keep skin clean and dry; Avoid shearing; Encourage use of lotion/moisturizer on skin; Relieve pressure to bony prominences; Increase activity as tolerated/progressive mobility; Monitor patient's hygiene practices  Goal: Neurovascular status is stable or improving  Outcome: Progressing  Flowsheets (Taken 11/14/2018 0430)  Neurovascular status  is stable or improving: Monitor/assess for signs of Venous Thrombus Emboli (edema of calf/thigh redness, pain); Monitor/assess site of invasive procedure for signs of bleeding  Goal: Effective  coping demonstrated  Outcome: Progressing  Flowsheets (Taken 11/14/2018 0430)  Effective coping demonstrated : Assess/report to LIP uncontrolled anxiety, depression, or ineffective coping; Offer reassurance to decrease anxiety   " The plan of care was discussed with the patient and/or caregiver, who agrees to it and demonstrates understanding of the disease process, risk factors, treatment plan, medications and consequences of noncompliance. All questions and concerns were addressed.

## 2018-11-14 NOTE — Progress Notes (Signed)
Per stroke coordinator RN, Azlynn, Patient no longer on stroke pathway.

## 2018-11-14 NOTE — Progress Notes (Signed)
Reviewed discharge instructions with patient, including home medications, follow up appointments, and signs of what to look for at home. Discussed signs of stroke and risk factors. All questions answered. Returned belongings. Tele box and IV removed. Transported via wheelchair to exit by tech in stable condition.

## 2018-11-14 NOTE — SLP Eval Note (Signed)
[facility]  [address]  [place], [place] [address]  [phone]    SPEECH LANGUAGE COGNITIVE SWALLOW EVALUATION    Attention MD:   Thank you for allowing [place] to participate in the care of [name]. Regulations from the Center for Medicare and Medicaid Services (CMS) require your review and approval of this plan of care.     Please cosign this note indicating you are in agreement with theTherapy Plan of Care so we may initiate the therapy treatment plan.        Patient: Emily [name]    MRN#: [id number]    Date/Time of Evaluation:   Time Calculation  SLP Received On: 11/14/18  Start Time: 0800  Stop Time: 0830  Time Calculation (min): 30 min  Total Treatment Time (min): 30    Consult received for Emily [name] for SLP Evaluation and Treatment.    Referring Physician: Dr. Theadora Rama    Date of Referral: 11/14/2018      Interpreter utilized: no, not indicated  Assessment and Clinical Impression   Emily [name] is a 73 y.o. female admitted 11/13/2018 for  Right sided numbness [R20.0], MRI is negative and presenting with speech, language, cognition and swallow WFL and suspect at baseline. Pt exhibits adequate communication, processing, word retrieval, organization, problem solving and memory. Pt reports no changes or problems with above. Oral motor exam is [facility] for symmetry and ROM, however, pt is missing several lower left teeth and several upper right teeth.  Mastication is slow, yet effective for regular solids as seen with fresh fruit on her breakfast tray. Swallow is [facility] for thin liquids and solids. Pt passes 3 oz water test (consecutive cup sips of thin liquids) and shows adequate oral clearance of solids. No s/s of aspiration noted.     Indication for instrumental assessment of swallow: n/a          Plan   Patient does not need SLP follow-up.  Regular diet with thin liquids.    Interdisciplinary Communication: Discussed with RN.       Nursing Dysphagia Screen: pass    Medical Diagnosis: Right sided  numbness [R20.0]    History of Present Illness:   Emily [name] is a 73 y.o. female admitted on  11/13/2018 with Right sided numbness [R20.0]    Past Medical/Surgical History:  Past Medical History:   Diagnosis Date   . Breast cancer 2014    right breast mastectomy   . GERD (gastroesophageal reflux disease)    . Hyperlipidemia    . Hypertension    . Hypothyroidism    . Malignant neoplasm of overlapping sites of right female breast 10/19/2015   . Vertigo      Past Surgical History:   Procedure Laterality Date   . HYSTERECTOMY     . 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    Subjective   Patient is agreeable to participation in the therapy session. Nursing clears patient for therapy. Patient's medical condition is  appropriate for Speech therapy intervention at this time.  Patient's Goal:  n/a  Pain: no evidence of pain noted during session         Precautions: none    Objective   Completed swallow and SLP evaluation with formal and informal measures.  RIPA-Problem Solving and Abstract Reasoning-100%  Verbal description of a cooking sequence-excellent  organization/specificity  Orientation-oriented x 4  Conversation-adequate word retrieval, processing, memory, thought generation      Educated the patient to role of speech therapy, plan of care, and goals of  therapy.      Goals of Care:  treatment/curative pathway    Goals:   n/a    Signature:  Daron Offer, M.S. CCC-SLP  [phone]

## 2018-11-14 NOTE — Discharge Instr - AVS First Page (Signed)
Reason for your Hospital Admission:  R sided weakness - transient      Instructions for after your discharge:    Follow up with Neurologist as discussed for MRI spine.

## 2018-11-14 NOTE — OT Eval Note (Addendum)
[facility]  [address]  [place], [place] [address]  [phone]    Occupational Therapy Evaluation    Patient: Emily [name] MRN: [id number]   Unit: [facility] INTERMEDIATE CARE Bed: MI623/MI623-01    Time of treatment: Time Calculation  OT Received On: 11/14/18  Start Time: 0900  Stop Time: 0913  Time Calculation (min): 13 min    Consult received for [name] for OT evaluation and treatment.  Patient's medical condition is appropriate for Occupational Therapy  intervention at this time.    Interpreter utilized: no, not indicated    D/C Suggestions   Home with no needs    Transport Recommendations: no limitations    Assessment       Emily [name] is a 73 y.o. female admitted 11/13/2018.  Pt admitted with new onset R sided N/T after waking up. Pt reports resolution of symptoms and MRI and other work up is negative for acute CVA. Pt reports resolution of all symptoms on eval, and is indep for socks at EOB, sinkside hand hygiene, transfers and mobility. Pt does  Not have any acute OT needs at this time. D/C OT  Brief chart review completed including review of labs review of imaging review of vitals.  Pt's ability to complete ADLs and functional transfers is at/near fxn'l baseline. Pt does not have any acute OT needs at this time. D/C OT.         Complexity Chart Review Performance Deficits Clinical Decision Making Hx/Comorbidities Assistance needed   Low  Brief  1-3 Limited options None None (or at baseline)       PMP - Progressive Mobility Protocol   PMP Activity: Step 6 - Walks in Room  Distance Walked (ft) (Step 6,7): 20 Feet         Interdisciplinary Communication: discussed with PT/RN    Plan     OT Plan  Risks/Benefits/POC Discussed with Pt/Family: With patient  Treatment Interventions: No skilled interventions needed at this time  Discharge Recommendation: Home with no needs  DME Recommended for Discharge: (no needs)  OT Frequency Recommended: one time visit - therapy discontinued         Medical  Diagnosis: Right sided numbness [R20.0]    History of Present Illness: Emily [name] is a 73 y.o. female admitted on  11/13/2018 with "history of breast CA, hypertension, hyperlipidemia, and hypothyroidism presented to the ED with onset of right-sided numbness on awakening this AM. Denies limb weakness, visual changes, dysarthria, or dysphasia.  Patient had similar symptoms on the left side approximately 6 months ago and ruled out for acute CVA.  MRI imaging is negative for acute stroke and her symptoms have gradually resolved."      Patient Active Problem List   Diagnosis   . Malignant neoplasm of overlapping sites of right female breast   . Hypertension   . Mixed hyperlipidemia   . Hypothyroid   . Right sided numbness     Past Medical History:   Diagnosis Date   . Breast cancer 2014    right breast mastectomy   . GERD (gastroesophageal reflux disease)    . Hyperlipidemia    . Hypertension    . Hypothyroidism    . Malignant neoplasm of overlapping sites of right female breast 10/19/2015   . Vertigo      Past Surgical History:   Procedure Laterality Date   . HYSTERECTOMY     . MASTECTOMY Right 2015  X-Rays/Tests/Labs:  Lab Results   Component Value Date/Time    HGB 11.7 11/13/2018 12:06 PM    HCT 38.8 11/13/2018 12:06 PM    K 4.0 11/13/2018 12:06 PM    NA 144 11/13/2018 12:06 PM    INR 0.9 11/13/2018 12:06 PM    TROPI <0.01 11/13/2018 05:43 PM    TROPI 0.02 11/13/2018 12:06 PM    TROPI <0.01 05/14/2018 10:24 AM    TROPI <0.01 04/03/2018 01:52 PM    TROPI 0.01 07/15/2009 09:40 AM     MRI Brain W WO Contrast (Order: [id number]) - 11/13/2018   IMPRESSION:   1. No evidence of acute intracranial abnormality.    2. Mild chronic small vessel ischemic changes.    MR Angiogram Neck W WO Contrast (Order: [id number]) - 11/13/2018   IMPRESSION:     Signal loss at the origin of the left vertebral artery is  likely artifactual; however, a true stenosis cannot be excluded.   Otherwise, no evidence of high grade vascular narrowing  or occlusion.      Chest 2 Views (Order: [id number]) - 11/13/2018   IMPRESSION:    No acute cardiopulmonary process.    Social History:  Lives alonein a apartment.  Entry Steps: 1 flight, no elevator accessRails: yesInside steps: 0Rails: 0  Equipment at home: tub/shower combination  Prior Level of Function: indep all ADL/IADL, no DME/AE at home, cooks, drives  Cognition: WFL  Mobility: independent  Feeding: independent  Grooming: independent  Bathing: independent  Dressing: independent  Toileting: independent    Subjective   "everything is fine now"  Patient is agreeable to participation in the therapy session. Nursing clears patient for therapy.  Patient's Goal:  To go home  Pain: pt denies pain      Objective     Precautions:   Precautions  Weight Bearing Status: no restrictions    Patient is in bed with  Telemetry and Intravenous Access  in place.       Observation of patient/vitals:   Vitals:    11/13/18 2002 11/13/18 2357 11/14/18 0404 11/14/18 0756   BP: 138/71 127/61 135/80 123/69   Pulse: 76 60 63 (!) 57   Resp: 18 18 18 20    Temp: 97.3 F (36.3 C) 98.2 F (36.8 C) 97.7 F (36.5 C) 97.3 F (36.3 C)   TempSrc: Oral Oral Oral Oral   SpO2: 99% 99% 99% 98%   Weight:       Height:           Orientation/Cognition:     Alert and Oriented x 4  Cognition: follows all commands      Musculoskeletal Examination:     ROM Strength   Neck/ Trunk WFL WFL   RUE WFL WFL   LUE WFL WFL     :   Sensation: Intact to light touch, denies N/T throughout B UE's.   Coordination: Intact gross motor and serial opposition to B hands    Vision: WFL  Hearing: WFL      Functional Mobility:  Rolling: indep    Supine to sit: indep   Scooting: indep   Sit to Supine: indep   Sit to stand: indep   Stand to sit: indep   Transfers: indep    Ambulation: indep 20'    Balance:  Static Sit Balance: good   Dynamic Sit Balance: good   Static Stand  Balance: good   Dynamic Stand Balance: good     Self Care:  Eating:  indep   Grooming: indep, sinkside hand hygiene   LB Dressing: indep socks, pants already on prior to arrival  Toileting: declines needs        Endurance: good    Participation:  good    Education:  Educated the patient to role of occupational therapy, plan of care, goals  of therapy and safety with mobility and ADLs.    RN notified of session outcome and that patient was left in bed with all needs met and equipment intact.   Safety measures include: handoff to nurse/clin tech/ unit secretary completed, oriented to call bell and placed within reach, personal items within reach, assistive device positioned out of reach and bed placed in lowest position.   Mobility and ADL status posted at bedside and within E.M.R.                Goals:  Goals  Goal Formulation: Patient  Time For Goal Achievement: (no goals indicated)      Signature:   Jackqulyn Mendel, OTR/L  11/14/2018  12:04 PM  Phone [phone]    (For scheduling questions, please contact rehab tech [phone])    Attention MD:   Thank you for allowing [place] to participate in the care of Kenniyah B Mcnulty. Regulations from the Center for Medicare and Medicaid Services (CMS) require your review and approval of this plan of care.     Please cosign this note indicating you are in agreement with theTherapy Plan of Care so we may initiate the therapy treatment plan.

## 2018-11-15 NOTE — ED Provider Notes (Signed)
EMERGENCY DEPARTMENT NOTE    Physician/Midlevel provider first contact with patient: 11/13/18 1205         HISTORY OF PRESENT ILLNESS   Historian:Patient  Translator Used: no    Chief Complaint: Right-sided numbness    73 y.o. female awoke this morning with right face arm and leg numbness with associated right sided chest pressure.  Patient notes that she went to sleep around 10 PM last night and was feeling well at that point in time.  Patient notes that her right leg feels mildly heavy but was able to ambulate.  No shortness of breath.  No nausea and vomiting.  No lower extremity swelling or pain.  No abdominal or back pain.    1. Location of symptoms: Right side  2. Onset of symptoms: Last known normal approximately 14 hours prior to arrival  3. What was patient doing when symptoms started (Context): see above  4. Severity: moderate  5. Timing: Constant  6. Activities that worsen symptoms: None  7. Activities that improve symptoms: None  8. Quality: Numbness  9. Radiation of symptoms: no  10. Associated signs and Symptoms: see above  11. Are symptoms worsening? yes  MEDICAL HISTORY     Past Medical History:  Past Medical History:   Diagnosis Date   . Breast cancer 2014    right breast mastectomy   . GERD (gastroesophageal reflux disease)    . Hyperlipidemia    . Hypertension    . Hypothyroidism    . Malignant neoplasm of overlapping sites of right female breast 10/19/2015   . Vertigo        Past Surgical History:  Past Surgical History:   Procedure Laterality Date   . HYSTERECTOMY     . MASTECTOMY Right 2015       Social History:  Social History     Socioeconomic History   . Marital status: Widowed     Spouse name: Not on file   . Number of children: Not on file   . Years of education: Not on file   . Highest education level: Not on file   Occupational History   . Not on file   Social Needs   . Financial resource strain: Not on file   . Food insecurity:     Worry: Not on file     Inability: Not on file   .  Transportation needs:     Medical: Not on file     Non-medical: Not on file   Tobacco Use   . Smoking status: Never Smoker   . Smokeless tobacco: Never Used   Substance and Sexual Activity   . Alcohol use: Yes     Alcohol/week: 1.0 standard drinks     Types: 1 Cans of beer per week     Comment: socially   . Drug use: No   . Sexual activity: Not on file   Lifestyle   . Physical activity:     Days per week: Not on file     Minutes per session: Not on file   . Stress: Not on file   Relationships   . Social connections:     Talks on phone: Not on file     Gets together: Not on file     Attends religious service: Not on file     Active member of club or organization: Not on file     Attends meetings of clubs or organizations: Not on file  Relationship status: Not on file   . Intimate partner violence:     Fear of current or ex partner: Not on file     Emotionally abused: Not on file     Physically abused: Not on file     Forced sexual activity: Not on file   Other Topics Concern   . Not on file   Social History Narrative   . Not on file       Family History:  Family History   Problem Relation Age of Onset   . Breast cancer Neg Hx        Outpatient Medication:  Discharge Medication List as of 11/14/2018  2:11 PM      CONTINUE these medications which have NOT CHANGED    Details   amLODIPine (NORVASC) 5 MG tablet Take 1 tablet (5 mg total) by mouth daily, Starting Thu 09/13/2018, Normal      anastrozole (ARIMIDEX) 1 MG tablet Take 1 tablet (1 mg total) by mouth daily, Starting Fri 01/19/2018, Normal      aspirin 81 MG EC tablet Take 81 mg by mouth every morning    , Historical Med      levothyroxine (SYNTHROID, LEVOTHROID) 25 MCG tablet Take 25 mcg by mouth every morning.    , Starting Wed 12/25/2013, Historical Med      lisinopril (PRINIVIL,ZESTRIL) 20 MG tablet Take 1 tablet (20 mg total) by mouth 2 (two) times daily, Starting Thu 09/13/2018, No Print      Multiple Vitamins-Minerals (MULTIVITAMIN WITH MINERALS) tablet  Take 1 tablet by mouth every morning    , Historical Med      simvastatin (ZOCOR) 40 MG tablet TAKE 1 TABLET BY MOUTH ONCE DAILY IN THE MORNING, Normal      vitamin B-1 (THIAMINE) 100 MG tablet Take 100 mg by mouth every morning., Historical Med      rosuvastatin (CRESTOR) 40 MG tablet Take 1 tablet (40 mg total) by mouth daily, Starting Thu 09/13/2018, Normal               REVIEW OF SYSTEMS   Review of Systems   Constitutional: Negative for chills and fever.   Respiratory: Negative for cough and shortness of breath.    Cardiovascular: Negative for leg swelling.   Gastrointestinal: Negative for abdominal pain, diarrhea, nausea and vomiting.   Musculoskeletal: Negative for back pain and neck pain.   Neurological: Positive for sensory change. Negative for headaches.   All other systems reviewed and are negative.    PHYSICAL EXAM     ED Triage Vitals   Enc Vitals Group      BP 11/13/18 1157 175/77      Heart Rate 11/13/18 1157 72      Resp Rate 11/13/18 1157 17      Temp 11/13/18 1157 98.1 F (36.7 C)      Temp Source 11/13/18 1157 Oral      SpO2 11/13/18 1157 99 %      Weight 11/13/18 1157 73.6 kg      Height 11/13/18 1648 1.6 m      Head Circumference --       Peak Flow --       Pain Score 11/13/18 1157 7      Pain Loc --       Pain Edu? --       Excl. in GC? --    Nursing note and vitals reviewed.  Constitutional:  Well developed, well nourished.  Awake & alert.    Head:  Atraumatic.  Normocephalic.    Eyes:  PERRL.  EOMI.  Conjunctivae are not pale.  ENT:  Mucous membranes are moist and intact.  Oropharynx is clear and symmetric.  Patent airway.  Neck:  Supple.  Full ROM.  No JVD.  No lymphadenopathy. No carotid bruit bilaterally  Cardiovascular:  Regular rate.  Regular rhythm.   Pulmonary/Chest:  No evidence of respiratory distress.  Clear to auscultation bilaterally.  No wheezing, rales or rhonchi. Chest non-tender.  Abdominal:  Soft and non-distended.  There is no tenderness.  No rebound, guarding, or rigidity.     Back:  No CVA tenderness. FROM.   Extremities:  No edema.   No cyanosis.  No clubbing.  Full range of motion in all extremities.  No calf tenderness.  Skin:  Skin is warm and dry.  No diaphoresis.  Neurological: Strength bilateral upper extremities is intact.  There is a very slight weakness of the right leg as compared to the left with a slight drift against gravity on the right leg.  Cranial nerves II through XII are intact except for subjective decrease sensation to the right face.  Also subjective decreased incision of the right arm and leg.  Psychiatric:  Good eye contact.  Normal interaction, affect, and behavior    MEDICAL DECISION MAKING     DISCUSSION    CT head rule out intracranial hemorrhage or CVA is negative for acute process.  tPA considered but given last known normal 14 hours prior to arrival not indicated.  EKG is without acute abnormality.  Doubt aortic etiology and chest x-ray does not show any mediastinal widening.  Given persistence of symptoms will admit for further evaluation and MRI of the brain to rule out stroke.  Dr. Francesco Sor neurology consulted.  Patient accepted to the [facility] service for further care.     NIH Stroke Score      Most Recent Value   Patient's calculated Stroke Score:  0 filed at 11/13/2018 1648          Vital Signs: Reviewed the patient?s vital signs.   Nursing Notes: Reviewed and utilized available nursing notes.  Medical Records Reviewed: Reviewed available past medical records.  Counseling: The emergency provider has spoken with the patient and discussed today?s findings, in addition to providing specific details for the plan of care.  Questions are answered and there is agreement with the plan.        CARDIAC STUDIES    The following cardiac studies were independently interpreted by the Emergency Medicine Physician.  For full cardiac study results please see chart.    Monitor Strip  Interpreted by ED Physician  Rate: 65  Rhythm: NSR   ST Changes: none    EKG  Interpretation:  Signed and interpreted by ED Provider   Time Interpreted: 1159  Rate: 70  Rhythm: NSR  Axis: normal  Intervals: normal  Blocks: none  ST segments: No acute changes  Interpretation: Nonspecific  EKG    EMERGENCY IMAGING STUDIES    The following imagine studies were independently interpreted by me (emergency physician):    Radiology:  Interpreted by me (ED Physician)  Study: Chest Xray   Results: No infiltrate. No pneumothorax. No hemothorax. No cardiomegaly. No CHF.  Impression: No acute intrathoracic abnormality.    RADIOLOGY IMAGING STUDIES      MR Angiogram Neck W WO Contrast   Final Result     Signal loss at the origin  of the left vertebral artery is   likely artifactual; however, a true stenosis cannot be excluded.    Otherwise, no evidence of high grade vascular narrowing or occlusion.        END OF IMPRESSION          Gustavus Messing, MD    11/13/2018 6:02 PM      MRI Brain W WO Contrast   Final Result       1. No evidence of acute intracranial abnormality.     2. Mild chronic small vessel ischemic changes.      Gustavus Messing, MD    11/13/2018 4:55 PM      MR Angiogram Head WO Contrast   Final Result    . No evidence of high grade vascular narrowing or occlusion.          END OF IMPRESSION          Gustavus Messing, MD    11/13/2018 4:48 PM      Chest 2 Views   Final Result    No acute cardiopulmonary process.      Mitali  Bapna, MD    11/13/2018 2:11 PM      CT Head WO Contrast   Final Result     No acute intracranial abnormality.      Mitali  Bapna, MD    11/13/2018 1:43 PM            PULSE OXIMETRY    Oxygen Saturation by Pulse Oximetry: 100%  Interventions: none  Interpretation: Normal    EMERGENCY DEPT. MEDICATIONS      ED Medication Orders (From admission, onward)    Start Ordered     Status Ordering Provider    11/13/18 1424 11/13/18 1423  aspirin tablet 325 mg  Daily     Route: Oral  Ordered Dose: 325 mg     Last MAR action:  Given Koryn Charlot D    11/13/18 1311 11/13/18 1311  acetaminophen  (TYLENOL) tablet 650 mg  Once     Route: Oral  Ordered Dose: 650 mg     Last MAR action:  Given Winni Ehrhard D          LABORATORY RESULTS    Ordered and independently interpreted AVAILABLE laboratory tests. Please see results section in chart for full details.  Results for orders placed or performed during the hospital encounter of 11/13/18   Comprehensive metabolic panel   Result Value Ref Range    Glucose 104 (H) 70 - 100 mg/dL    BUN 8 7 - 19 mg/dL    Creatinine 0.8 0.6 - 1.0 mg/dL    Sodium 161 096 - 045 mEq/L    Potassium 4.0 3.5 - 5.1 mEq/L    Chloride 109 100 - 111 mEq/L    CO2 25 22 - 29 mEq/L    Calcium 8.9 7.9 - 10.2 mg/dL    Protein, Total 6.5 6.0 - 8.3 g/dL    Albumin 3.8 3.5 - 5.0 g/dL    AST (SGOT) 19 5 - 34 U/L    ALT 16 0 - 55 U/L    Alkaline Phosphatase 87 37 - 106 U/L    Bilirubin, Total 0.3 0.2 - 1.2 mg/dL    Globulin 2.7 2.0 - 3.6 g/dL    Albumin/Globulin Ratio 1.4 0.9 - 2.2    Anion Gap 10.0 5.0 - 15.0   Troponin I   Result Value Ref Range    Troponin I  0.02 0.00 - 0.05 ng/mL   CBC and differential   Result Value Ref Range    WBC 8.14 3.10 - 9.50 x10 3/uL    Hgb 11.7 11.4 - 14.8 g/dL    Hematocrit 16.1 09.6 - 43.7 %    Platelets 291 142 - 346 x10 3/uL    RBC 4.43 3.90 - 5.10 x10 6/uL    MCV 87.6 78.0 - 96.0 fL    MCH 26.4 25.1 - 33.5 pg    MCHC 30.2 (L) 31.5 - 35.8 g/dL    RDW 13 11 - 15 %    MPV 11.3 8.9 - 12.5 fL    Neutrophils 60.0 None %    Lymphocytes Automated 30.7 None %    Monocytes 7.4 None %    Eosinophils Automated 1.0 None %    Basophils Automated 0.5 None %    Immature Granulocyte 0.4 None %    Nucleated RBC 0.0 0.0 - 0.0 /100 WBC    Neutrophils Absolute 4.89 1.10 - 6.33 x10 3/uL    Abs Lymph Automated 2.50 0.42 - 3.22 x10 3/uL    Abs Mono Automated 0.60 0.21 - 0.85 x10 3/uL    Abs Eos Automated 0.08 0.00 - 0.44 x10 3/uL    Absolute Baso Automated 0.04 0.00 - 0.08 x10 3/uL    Absolute Immature Granulocyte 0.03 0.00 - 0.07 x10 3/uL    Absolute NRBC 0.00 0.00 - 0.00 x10 3/uL    GFR   Result Value Ref Range    EGFR >60.0    Prothrombin time/INR   Result Value Ref Range    PT 12.3 (L) 12.6 - 15.0 sec    PT INR 0.9 0.9 - 1.1   APTT   Result Value Ref Range    PTT 35 23 - 37 sec   Troponin I   Result Value Ref Range    Troponin I <0.01 0.00 - 0.05 ng/mL   Hemoglobin A1C   Result Value Ref Range    Hemoglobin A1C 5.8 4.6 - 5.9 %    Average Estimated Glucose 119.8 mg/dL   Lipid panel   Result Value Ref Range    Cholesterol 181 0 - 199 mg/dL    Triglycerides 81 34 - 149 mg/dL    HDL 53 40 - 0,454 mg/dL    LDL Calculated 098 (H) 0 - 99 mg/dL    VLDL Cholesterol Cal 16 10 - 40 mg/dL    CHOL/HDL Ratio 3.4 See Below   Hemolysis index   Result Value Ref Range    Hemolysis Index 9 0 - 18       CRITICAL CARE/PROCEDURES    Procedures    DIAGNOSIS      Diagnosis:  Final diagnoses:   Right sided numbness       Disposition:  ED Disposition     ED Disposition Condition Date/Time Comment    Observation  Tue Nov 13, 2018 1419 Admitting Physician: Arna Medici [[id number]]   Diagnosis: Right sided numbness [[id number]]   Estimated Length of Stay: < 2 midnights   Tentative Discharge Plan?: Home or Self Care [1]   Patient Class: Observation [104]            Prescriptions:  Discharge Medication List as of 11/14/2018  2:11 PM      CONTINUE these medications which have NOT CHANGED    Details   amLODIPine (NORVASC) 5 MG tablet Take 1 tablet (5 mg total) by mouth daily, Starting Thu 09/13/2018, Normal  anastrozole (ARIMIDEX) 1 MG tablet Take 1 tablet (1 mg total) by mouth daily, Starting Fri 01/19/2018, Normal      aspirin 81 MG EC tablet Take 81 mg by mouth every morning    , Historical Med      levothyroxine (SYNTHROID, LEVOTHROID) 25 MCG tablet Take 25 mcg by mouth every morning.    , Starting Wed 12/25/2013, Historical Med      lisinopril (PRINIVIL,ZESTRIL) 20 MG tablet Take 1 tablet (20 mg total) by mouth 2 (two) times daily, Starting Thu 09/13/2018, No Print      Multiple Vitamins-Minerals (MULTIVITAMIN WITH  MINERALS) tablet Take 1 tablet by mouth every morning    , Historical Med      simvastatin (ZOCOR) 40 MG tablet TAKE 1 TABLET BY MOUTH ONCE DAILY IN THE MORNING, Normal      vitamin B-1 (THIAMINE) 100 MG tablet Take 100 mg by mouth every morning., Historical Med      rosuvastatin (CRESTOR) 40 MG tablet Take 1 tablet (40 mg total) by mouth daily, Starting Thu 09/13/2018, Normal         STOP taking these medications       acetaminophen (TYLENOL) 500 MG tablet Comments:   Reason for Stopping:               This note was generated by the Epic EMR system/ Dragon speech recognition and may contain inherent errors or omissions not intended by the user. Grammatical errors, random word insertions, deletions and pronoun errors are occasional consequences of this technology due to software limitations. Not all errors are caught or corrected. If there are questions or concerns about the content of this note or information contained within the body of this dictation they should be addressed directly with the author for clarification.     Shela Nevin, MD  11/15/18 [phone]

## 2018-11-19 ENCOUNTER — Other Ambulatory Visit: Payer: Self-pay | Admitting: Hematology & Oncology

## 2018-11-19 DIAGNOSIS — C50919 Malignant neoplasm of unspecified site of unspecified female breast: Secondary | ICD-10-CM

## 2018-11-21 ENCOUNTER — Other Ambulatory Visit: Payer: Self-pay | Admitting: Surgery

## 2018-11-21 DIAGNOSIS — C50919 Malignant neoplasm of unspecified site of unspecified female breast: Secondary | ICD-10-CM

## 2018-11-21 DIAGNOSIS — Z9011 Acquired absence of right breast and nipple: Secondary | ICD-10-CM

## 2018-11-21 DIAGNOSIS — Z853 Personal history of malignant neoplasm of breast: Secondary | ICD-10-CM

## 2018-11-22 LAB — ECG 12-LEAD
Atrial Rate: 70 {beats}/min
P Axis: 57 degrees
P-R Interval: 162 ms
Q-T Interval: 380 ms
QRS Duration: 86 ms
QTC Calculation (Bezet): 410 ms
R Axis: 39 degrees
T Axis: 23 degrees
Ventricular Rate: 70 {beats}/min

## 2018-12-18 ENCOUNTER — Other Ambulatory Visit: Payer: Self-pay | Admitting: Hematology & Oncology

## 2018-12-18 DIAGNOSIS — M810 Age-related osteoporosis without current pathological fracture: Secondary | ICD-10-CM

## 2019-01-01 ENCOUNTER — Emergency Department: Payer: Medicare Other

## 2019-01-01 ENCOUNTER — Emergency Department
Admission: EM | Admit: 2019-01-01 | Discharge: 2019-01-01 | Disposition: A | Discharge status: Home / Self Care | Payer: Medicare Other | Attending: Emergency Medicine | Admitting: Emergency Medicine

## 2019-01-01 DIAGNOSIS — E785 Hyperlipidemia, unspecified: Secondary | ICD-10-CM | POA: Insufficient documentation

## 2019-01-01 DIAGNOSIS — I10 Essential (primary) hypertension: Secondary | ICD-10-CM | POA: Insufficient documentation

## 2019-01-01 DIAGNOSIS — E039 Hypothyroidism, unspecified: Secondary | ICD-10-CM | POA: Insufficient documentation

## 2019-01-01 DIAGNOSIS — Z7982 Long term (current) use of aspirin: Secondary | ICD-10-CM | POA: Insufficient documentation

## 2019-01-01 DIAGNOSIS — R002 Palpitations: Secondary | ICD-10-CM | POA: Insufficient documentation

## 2019-01-01 DIAGNOSIS — R600 Localized edema: Secondary | ICD-10-CM

## 2019-01-01 LAB — CBC AND DIFFERENTIAL
Absolute NRBC: 0 10*3/uL (ref 0.00–0.00)
Basophils Absolute Automated: 0.02 10*3/uL (ref 0.00–0.08)
Basophils Automated: 0.2 %
Eosinophils Absolute Automated: 0.03 10*3/uL (ref 0.00–0.44)
Eosinophils Automated: 0.3 %
Hematocrit: 39.6 % (ref 34.7–43.7)
Hgb: 12.2 g/dL (ref 11.4–14.8)
Immature Granulocytes Absolute: 0.02 10*3/uL (ref 0.00–0.07)
Immature Granulocytes: 0.2 %
Lymphocytes Absolute Automated: 1.98 10*3/uL (ref 0.42–3.22)
Lymphocytes Automated: 22.4 %
MCH: 26 pg (ref 25.1–33.5)
MCHC: 30.8 g/dL — ABNORMAL LOW (ref 31.5–35.8)
MCV: 84.4 fL (ref 78.0–96.0)
MPV: 11.1 fL (ref 8.9–12.5)
Monocytes Absolute Automated: 0.61 10*3/uL (ref 0.21–0.85)
Monocytes: 6.9 %
Neutrophils Absolute: 6.18 10*3/uL (ref 1.10–6.33)
Neutrophils: 70 %
Nucleated RBC: 0 /100 WBC (ref 0.0–0.0)
Platelets: 380 10*3/uL — ABNORMAL HIGH (ref 142–346)
RBC: 4.69 10*6/uL (ref 3.90–5.10)
RDW: 13 % (ref 11–15)
WBC: 8.84 10*3/uL (ref 3.10–9.50)

## 2019-01-01 LAB — COMPREHENSIVE METABOLIC PANEL
ALT: 19 U/L (ref 0–55)
AST (SGOT): 27 U/L (ref 5–34)
Albumin/Globulin Ratio: 1.6 (ref 0.9–2.2)
Albumin: 4.6 g/dL (ref 3.5–5.0)
Alkaline Phosphatase: 104 U/L (ref 37–106)
Anion Gap: 16 — ABNORMAL HIGH (ref 5.0–15.0)
BUN: 12 mg/dL (ref 7–19)
Bilirubin, Total: 0.4 mg/dL (ref 0.2–1.2)
CO2: 24 mEq/L (ref 22–29)
Calcium: 10 mg/dL (ref 7.9–10.2)
Chloride: 101 mEq/L (ref 100–111)
Creatinine: 1 mg/dL (ref 0.6–1.0)
Globulin: 2.9 g/dL (ref 2.0–3.6)
Glucose: 127 mg/dL — ABNORMAL HIGH (ref 70–100)
Potassium: 3.4 mEq/L — ABNORMAL LOW (ref 3.5–5.1)
Protein, Total: 7.5 g/dL (ref 6.0–8.3)
Sodium: 141 mEq/L (ref 136–145)

## 2019-01-01 LAB — TROPONIN I: Troponin I: <0.01 ng/mL (ref 0.00–0.05)

## 2019-01-01 LAB — PHOSPHORUS: Phosphorus: 4.2 mg/dL (ref 2.3–4.7)

## 2019-01-01 LAB — B-TYPE NATRIURETIC PEPTIDE: B-Natriuretic Peptide: 25 pg/mL (ref 0–100)

## 2019-01-01 LAB — IHS D-DIMER: D-Dimer: 0.47 ug/mL FEU (ref 0.00–0.70)

## 2019-01-01 LAB — MAGNESIUM: Magnesium: 2.6 mg/dL (ref 1.6–2.6)

## 2019-01-01 LAB — GFR: EGFR: >60

## 2019-01-01 NOTE — Discharge Instructions (Signed)
LE Edema Etiology Unknown     You have been seen today for swelling of your leg(s).     Despite the doctor s evaluation, the cause of the swelling is unknown.     There are many possible causes for leg swelling. Below is a description of some of these causes:  · Deep Venous Thrombus (DVT): This is a blood clot in one of the deep veins of your leg. Symptoms can include leg pain and swelling. The diagnosis is often made with a leg ultrasound which can detect a blockage in the veins.  · Congestive heart failure is a condition where some fluid builds up in the lungs because of a heart problem. The main symptom is shortness of breath which worsens when you lie down. You may also notice leg swelling and weight gain.  · Cellulitis: This is a bacterial infection of the skin. Symptoms are usually redness, swelling, and warmth in the affected area. Some people get a fever (temperature higher than 100.4ºF / 38ºC) with this infection.  · Venous Stasis: This happens when blood pools in the legs for some time. The legs get swollen and sometimes get red. The swelling gets better at night because lying flat makes it easier for blood to return to the heart. During the day as a person stands or sits with their legs dangling down, the blood has trouble getting out of the legs again and swelling gets worse.  · Low amounts of Albumin: Albumin is a type of protein that is carried in the blood stream. One thing it does is keep the fluid part of the blood from leaking out of the blood vessels and into the surrounding tissues. Low albumin can be from poor nutrition, alcoholism, liver disease or other chronic (ongoing) illnesses.     You had a duplex-Doppler ultrasound of the leg to look for a blood clot. No blood clot was seen. This test is not perfect, but it does a very good job finding blood clots. Sometimes, a doctor may want the ultrasound repeated in about a week to recheck the results.     The exact cause of the swelling is not known  even though tests may have been done here today. Even though the cause is not known, the doctor feels that it is OK for you to go home. You need to see your doctor or referral doctor for more tests.     YOU SHOULD SEEK MEDICAL ATTENTION IMMEDIATELY, EITHER HERE OR AT THE NEAREST EMERGENCY DEPARTMENT, IF ANY OF THE FOLLOWING OCCURS:  · Your leg swelling gets worse.  · Your legs get red and you have pain / fever (temperature higher than 100.4ºF / 38ºC).  · You develop any shortness of breath, chest pain or pressure over your heart.  · You develop any shortness of breath, especially if there is pain in your chest when you breathe in.              Palpitations     You have been diagnosed with "palpitations."     Palpitations are beats in the chest that feel funny or strange. They are often caused by extra heartbeats that come earlier than normal. These are either "premature atrial contractions" or "premature ventricular contractions," depending on where in the heart they happen. Palpitations often go away on their own and often cause no serious problems. They are sometimes related to stress or lack of sleep. They can also be related too much caffeine. These symptoms can be caused by many over-the-counter (no prescription   needed) cold medicines, diet pills and "natural" vitamin supplements with stimulants, often ephedrine (Ephedrine is also known by its traditional Chinese name, Ma huang).     Palpitations feel different for different people. Some patients describe a feeling of "butterflies" in the chest. Others say it feels like the heart is "flipping over" in the chest. Palpitations may happen often but should last only a second or two each time. They should not cause any chest pain, lightheadedness, dizziness or fainting.     There is no specific treatment for palpitations. However, you should avoid all caffeine and cold medications. Also avoid all natural stimulants and chocolate.     Follow up with your primary  doctor in the next week to make sure your symptoms are getting better. In some cases, a Holter monitor may be ordered. A Holter monitor is a portable heart monitor. It records your heart s electrical rhythm. You will need to see your regular doctor to get the results from the test.     YOU SHOULD SEEK MEDICAL ATTENTION IMMEDIATELY, EITHER HERE OR AT THE NEAREST EMERGENCY DEPARTMENT, IF ANY OF THE FOLLOWING OCCURS:  · Lightheadedness or the feeling you might faint.  · An unusually fast or slow heart rate.  · Chest pain or shortness of breath.  · Palpitations increase when you exercise.  · Any other worsening symptoms or concerns.

## 2019-01-01 NOTE — EDIE (Signed)
COLLECTIVE?NOTIFICATION?01/01/2019 11:33?Emily [name], Emily [name]?MRN: [id number]    [facility]'s patient encounter information:   VWU:[id number]  Account [id number]  Billing Account [id number]      Criteria Met      5 ED Visits in 12 Months    Security and Safety  No recent Security Events currently on file    ED Care Guidelines  There are currently no ED Care Guidelines for this patient. Please check your facility's medical records system.        Prescription Monitoring Program  PDMP query found no report.  Narx Score not available at this time.      E.D. Visit Count (12 mo.)  Facility Visits   [place] [facility] 6   Total 6   Note: Visits indicate total known visits.      Recent Emergency Department Visit Summary  Date Facility [facility] Type Diagnoses or Chief Complaint   Jan 01, 2019 [place] [facility]      legs swelling, rapid heart rate      Nov 13, 2018 [place] [facility] [place] [facility]      CHEST PAIN, ARM TINGLING      Chest Pain      Anesthesia of skin      Jun 20, 2018 [place] [facility] [place] [facility]      fell at home, injuring head, face, right knee, feels lightheaded      Fall      Other chest pain      Fall (on)(from) sidewalk curb, initial encounter      Pain in right knee      Pain in right shoulder      Other injury of unspecified body region, initial encounter      May 14, 2018 [place] [facility] [place]      dizziness, sycope      Extremity Weakness      Dizziness      Weakness      Apr 03, 2018 [place] [facility] [place] [facility] [place] [facility]      medic      Generalized weakness      Nausea      Dizziness and giddiness      Unspecified nystagmus      Weakness      Feb 07, 2018 [place] [facility] [place] [facility] [place] [facility]      Triage-Weakness      Generalized Body Aches      Acute bronchitis, unspecified          Recent Inpatient Visit Summary  Date Facility [facility] Type Diagnoses or Chief Complaint    Apr 03, 2018  - [place] [facility]      Unspecified nystagmus      Dizziness and giddiness      Weakness      Nausea          Care Team  Provider Specialty Phone Fax Service Dates   DAVIDSON, Janet Berlin MD M, MD Family Medicine: Adult Medicine [phone] Current      Collective Portal  This patient has registered at the [facility] Emergency Department   For more information visit: [email or web address]     PLEASE NOTE:     1.   Any care recommendations and other clinical information are provided as guidelines or for historical  purposes only, and providers should exercise their own clinical judgment when providing care.    2.   You may only use this information for purposes of treatment, payment or health care operations activities, and subject to the limitations of applicable [employer].    3.   You should consult directly with the organization that provided a care guideline or other clinical history with any questions about additional information or accuracy or completeness of information provided.    ? 2020 [employer] - [email or web address]

## 2019-01-02 NOTE — ED Provider Notes (Signed)
EMERGENCY DEPARTMENT NOTE       HISTORY OF PRESENT ILLNESS   Historian:Patient  Translator Used: No    Chief Complaint: Chest Pain and Leg Swelling     Mechanism of Injury:     73 y.o. female with h/o breast cancer presents to the ED complaining of bilateral lower leg swelling and feeling that her heart is beating fast.  Mild chest discomfort and SOB associated with symptoms.  No cough or fever.  No immunosuppression.      1. Location of symptoms: see above  2. Onset of symptoms: this morning  3. What was patient doing when symptoms started (Context): see above  4. Severity: moderate  5. Timing: constant  6. Activities that worsen symptoms: nothing  7. Activities that improve symptoms: nothing  8. Quality: racing  9. Radiation of symptoms: no  10. Associated signs and Symptoms: see above  11. Are symptoms worsening? yes  MEDICAL HISTORY     Past Medical History:  Past Medical History:   Diagnosis Date   . Breast cancer 2014    right breast mastectomy   . GERD (gastroesophageal reflux disease)    . Hyperlipidemia    . Hypertension    . Hypothyroidism    . Malignant neoplasm of overlapping sites of right female breast 10/19/2015   . Vertigo        Past Surgical History:  Past Surgical History:   Procedure Laterality Date   . HYSTERECTOMY     . MASTECTOMY Right 2015       Social History:  Social History     Socioeconomic History   . Marital status: Widowed     Spouse name: Not on file   . Number of children: Not on file   . Years of education: Not on file   . Highest education level: Not on file   Occupational History   . Not on file   Social Needs   . Financial resource strain: Not on file   . Food insecurity:     Worry: Not on file     Inability: Not on file   . Transportation needs:     Medical: Not on file     Non-medical: Not on file   Tobacco Use   . Smoking status: Never Smoker   . Smokeless tobacco: Never Used   Substance and Sexual Activity   . Alcohol use: Yes     Alcohol/week: 1.0 standard drinks     Types: 1  Cans of beer per week     Comment: socially   . Drug use: No   . Sexual activity: Not on file   Lifestyle   . Physical activity:     Days per week: Not on file     Minutes per session: Not on file   . Stress: Not on file   Relationships   . Social connections:     Talks on phone: Not on file     Gets together: Not on file     Attends religious service: Not on file     Active member of club or organization: Not on file     Attends meetings of clubs or organizations: Not on file     Relationship status: Not on file   . Intimate partner violence:     Fear of current or ex partner: Not on file     Emotionally abused: Not on file     Physically abused: Not on file  Forced sexual activity: Not on file   Other Topics Concern   . Not on file   Social History Narrative   . Not on file       Family History:  Family History   Problem Relation Age of Onset   . Breast cancer Neg Hx        Outpatient Medication:  Discharge Medication List as of 01/01/2019  1:34 PM      CONTINUE these medications which have NOT CHANGED    Details   amLODIPine (NORVASC) 5 MG tablet Take 1 tablet (5 mg total) by mouth daily, Starting Thu 09/13/2018, Normal      anastrozole (ARIMIDEX) 1 MG tablet Take 1 tablet (1 mg total) by mouth daily, Starting Fri 01/19/2018, Normal      aspirin 81 MG EC tablet Take 81 mg by mouth every morning    , Historical Med      levothyroxine (SYNTHROID, LEVOTHROID) 25 MCG tablet Take 25 mcg by mouth every morning.    , Starting Wed 12/25/2013, Historical Med      lisinopril (PRINIVIL,ZESTRIL) 20 MG tablet Take 1 tablet (20 mg total) by mouth 2 (two) times daily, Starting Thu 09/13/2018, No Print      Multiple Vitamins-Minerals (MULTIVITAMIN WITH MINERALS) tablet Take 1 tablet by mouth every morning    , Historical Med      rosuvastatin (CRESTOR) 40 MG tablet Take 1 tablet (40 mg total) by mouth daily, Starting Wed 11/14/2018, Normal      simvastatin (ZOCOR) 40 MG tablet TAKE 1 TABLET BY MOUTH ONCE DAILY IN THE MORNING,  Normal      vitamin B-1 (THIAMINE) 100 MG tablet Take 100 mg by mouth every morning., Historical Med               REVIEW OF SYSTEMS   Review of Systems   Constitutional: Negative.  Negative for chills and fever.   Respiratory: Positive for shortness of breath. Negative for cough.    Cardiovascular: Positive for palpitations and leg swelling.   Gastrointestinal: Negative.  Negative for nausea and vomiting.   Neurological: Negative.  Negative for dizziness and headaches.   All other systems reviewed and are negative.       PHYSICAL EXAM     ED Triage Vitals [01/01/19 1140]   Enc Vitals Group      BP 195/90      Heart Rate (!) 104      Resp Rate 18      Temp 98.4 F (36.9 C)      Temp Source Oral      SpO2 97 %      Weight 71.6 kg      Height 1.6 m      Head Circumference       Peak Flow       Pain Score 8      Pain Loc       Pain Edu?       Excl. in GC?      Physical Exam  Vitals signs and nursing note reviewed.   Constitutional:       Appearance: She is well-developed.   HENT:      Head: Normocephalic and atraumatic.   Eyes:      Extraocular Movements: Extraocular movements intact.      Pupils: Pupils are equal, round, and reactive to light.   Neck:      Musculoskeletal: Normal range of motion and neck supple.   Cardiovascular:  Rate and Rhythm: Regular rhythm. Tachycardia present.      Heart sounds: Normal heart sounds.   Pulmonary:      Effort: Pulmonary effort is normal.      Breath sounds: Normal breath sounds. No wheezing, rhonchi or rales.   Musculoskeletal:      Right lower leg: She exhibits no tenderness. Edema present.      Left lower leg: She exhibits no tenderness. Edema present.   Skin:     General: Skin is warm and dry.      Capillary Refill: Capillary refill takes less than 2 seconds.      Findings: No erythema.   Neurological:      General: No focal deficit present.      Mental Status: She is alert and oriented to person, place, and time.           MEDICAL DECISION MAKING     DISCUSSION     Patient with bilateral lower extremity swelling and palpitations with mild SOB and tightness.  ECG with sinus tachycardia.  CXR with no pulmonary edema, PNA or PTX.  Labs with no anemia, normal BNP and troponin, no significant electrolyte abnormality, and negative d-dimer.  Doubt PE, ACS.  HR improved in ED.  Placed in compression stocking and referred to cards for outpatient follow-up.  Given follow-up and return instructions.      Vital Signs: Reviewed the patient's vital signs.   Nursing Notes: Reviewed and utilized available nursing notes.  Medical Records Reviewed: Reviewed available past medical records.  Counseling: The emergency provider has spoken with the patient and discussed today's findings, in addition to providing specific details for the plan of care.  Questions are answered and there is agreement with the plan.      CARDIAC STUDIES    The following cardiac studies were independently interpreted by the Emergency Medicine Physician.  For full cardiac study results please see chart.    Monitor Strip  Interpreted by ED Physician  Rate: 90  Rhythm: NSR   ST Changes: none    EKG Interpretation:  Signed and interpreted byED Physician   Time Interpreted: 1145  Rate: 95  Rhythm: NSR  Axis: normal  Intervals: normal  Blocks: none  ST segments: nonspecific changes  Interpretation: Nonspecific  EKG    RADIOLOGY IMAGING STUDIES      XR Chest  AP Portable   Final Result     No acute cardiopulmonary process.      Wyatt Portela, MD    01/01/2019 12:10 PM        PULSE OXIMETRY    Oxygen Saturation by Pulse Oximetry: 100%  Interventions: none  Interpretation:  Normal     EMERGENCY DEPT. MEDICATIONS      ED Medication Orders (From admission, onward)    None          LABORATORY RESULTS    Ordered and independently interpreted AVAILABLE laboratory tests. Please see results section in chart for full details.  Results for orders placed or performed during the hospital encounter of 01/01/19   CBC and differential   Result  Value Ref Range    WBC 8.84 3.10 - 9.50 x10 3/uL    Hgb 12.2 11.4 - 14.8 g/dL    Hematocrit 16.1 09.6 - 43.7 %    Platelets 380 (H) 142 - 346 x10 3/uL    RBC 4.69 3.90 - 5.10 x10 6/uL    MCV 84.4 78.0 - 96.0 fL    MCH  26.0 25.1 - 33.5 pg    MCHC 30.8 (L) 31.5 - 35.8 g/dL    RDW 13 11 - 15 %    MPV 11.1 8.9 - 12.5 fL    Neutrophils 70.0 None %    Lymphocytes Automated 22.4 None %    Monocytes 6.9 None %    Eosinophils Automated 0.3 None %    Basophils Automated 0.2 None %    Immature Granulocyte 0.2 None %    Nucleated RBC 0.0 0.0 - 0.0 /100 WBC    Neutrophils Absolute 6.18 1.10 - 6.33 x10 3/uL    Abs Lymph Automated 1.98 0.42 - 3.22 x10 3/uL    Abs Mono Automated 0.61 0.21 - 0.85 x10 3/uL    Abs Eos Automated 0.03 0.00 - 0.44 x10 3/uL    Absolute Baso Automated 0.02 0.00 - 0.08 x10 3/uL    Absolute Immature Granulocyte 0.02 0.00 - 0.07 x10 3/uL    Absolute NRBC 0.00 0.00 - 0.00 x10 3/uL   Comprehensive metabolic panel   Result Value Ref Range    Glucose 127 (H) 70 - 100 mg/dL    BUN 12 7 - 19 mg/dL    Creatinine 1.0 0.6 - 1.0 mg/dL    Sodium 540 981 - 191 mEq/L    Potassium 3.4 (L) 3.5 - 5.1 mEq/L    Chloride 101 100 - 111 mEq/L    CO2 24 22 - 29 mEq/L    Calcium 10.0 7.9 - 10.2 mg/dL    Protein, Total 7.5 6.0 - 8.3 g/dL    Albumin 4.6 3.5 - 5.0 g/dL    AST (SGOT) 27 5 - 34 U/L    ALT 19 0 - 55 U/L    Alkaline Phosphatase 104 37 - 106 U/L    Bilirubin, Total 0.4 0.2 - 1.2 mg/dL    Globulin 2.9 2.0 - 3.6 g/dL    Albumin/Globulin Ratio 1.6 0.9 - 2.2    Anion Gap 16.0 (H) 5.0 - 15.0   B-type Natriuretic Peptide   Result Value Ref Range    B-Natriuretic Peptide 25 0 - 100 pg/mL   Magnesium   Result Value Ref Range    Magnesium 2.6 1.6 - 2.6 mg/dL   Phosphorus   Result Value Ref Range    Phosphorus 4.2 2.3 - 4.7 mg/dL   Troponin I   Result Value Ref Range    Troponin I <0.01 0.00 - 0.05 ng/mL   GFR   Result Value Ref Range    EGFR >60.0    D-Dimer   Result Value Ref Range    D-Dimer 0.47 0.00 - 0.70 ug/mL FEU   ECG 12  lead   Result Value Ref Range    Ventricular Rate 95 BPM    Atrial Rate 95 BPM    P-R Interval 182 ms    QRS Duration 90 ms    Q-T Interval 360 ms    QTC Calculation (Bezet) 452 ms    P Axis 49 degrees    R Axis 24 degrees    T Axis 234 degrees       CRITICAL CARE/PROCEDURES    Procedures    DIAGNOSIS      Diagnosis:  Final diagnoses:   Bilateral leg edema   Palpitations       Disposition:  ED Disposition     ED Disposition Condition Date/Time Comment    Discharge  Tue Jan 01, 2019  1:34 PM Patriciaann Clan Loken discharge to home/self  care.    Condition at disposition: Stable          Prescriptions:  Discharge Medication List as of 01/01/2019  1:34 PM      CONTINUE these medications which have NOT CHANGED    Details   amLODIPine (NORVASC) 5 MG tablet Take 1 tablet (5 mg total) by mouth daily, Starting Thu 09/13/2018, Normal      anastrozole (ARIMIDEX) 1 MG tablet Take 1 tablet (1 mg total) by mouth daily, Starting Fri 01/19/2018, Normal      aspirin 81 MG EC tablet Take 81 mg by mouth every morning    , Historical Med      levothyroxine (SYNTHROID, LEVOTHROID) 25 MCG tablet Take 25 mcg by mouth every morning.    , Starting Wed 12/25/2013, Historical Med      lisinopril (PRINIVIL,ZESTRIL) 20 MG tablet Take 1 tablet (20 mg total) by mouth 2 (two) times daily, Starting Thu 09/13/2018, No Print      Multiple Vitamins-Minerals (MULTIVITAMIN WITH MINERALS) tablet Take 1 tablet by mouth every morning    , Historical Med      rosuvastatin (CRESTOR) 40 MG tablet Take 1 tablet (40 mg total) by mouth daily, Starting Wed 11/14/2018, Normal      simvastatin (ZOCOR) 40 MG tablet TAKE 1 TABLET BY MOUTH ONCE DAILY IN THE MORNING, Normal      vitamin B-1 (THIAMINE) 100 MG tablet Take 100 mg by mouth every morning., Historical Med                Larina Bras, MD  01/02/19 [phone]

## 2019-01-03 LAB — ECG 12-LEAD
Atrial Rate: 95 {beats}/min
P Axis: 49 degrees
P-R Interval: 182 ms
Q-T Interval: 360 ms
QRS Duration: 90 ms
QTC Calculation (Bezet): 452 ms
R Axis: 24 degrees
T Axis: 234 degrees
Ventricular Rate: 95 {beats}/min

## 2019-01-07 ENCOUNTER — Telehealth (INDEPENDENT_AMBULATORY_CARE_PROVIDER_SITE_OTHER): Payer: Self-pay | Admitting: Cardiology

## 2019-01-07 ENCOUNTER — Telehealth (INDEPENDENT_AMBULATORY_CARE_PROVIDER_SITE_OTHER): Payer: Medicare Other | Admitting: Cardiology

## 2019-01-07 NOTE — Telephone Encounter (Signed)
No note

## 2019-01-07 NOTE — Telephone Encounter (Signed)
Patient was call multiple times no answer. Left voicemail to call back and reschedule appointment

## 2019-01-07 NOTE — Telephone Encounter (Signed)
Called patient twice at time of appointment for DR [facility] 4/6, patient's phone kept going in and out of static. Called back went to voice mail.

## 2019-01-11 ENCOUNTER — Emergency Department
Admission: EM | Admit: 2019-01-11 | Discharge: 2019-01-11 | Disposition: A | Discharge status: Home / Self Care | Payer: Medicare Other | Attending: Emergency Medicine | Admitting: Emergency Medicine

## 2019-01-11 ENCOUNTER — Emergency Department: Payer: Medicare Other

## 2019-01-11 DIAGNOSIS — Z20828 Contact with and (suspected) exposure to other viral communicable diseases: Secondary | ICD-10-CM | POA: Insufficient documentation

## 2019-01-11 DIAGNOSIS — R059 Cough, unspecified: Secondary | ICD-10-CM

## 2019-01-11 DIAGNOSIS — R9431 Abnormal electrocardiogram [ECG] [EKG]: Secondary | ICD-10-CM

## 2019-01-11 DIAGNOSIS — Z853 Personal history of malignant neoplasm of breast: Secondary | ICD-10-CM | POA: Insufficient documentation

## 2019-01-11 DIAGNOSIS — E039 Hypothyroidism, unspecified: Secondary | ICD-10-CM | POA: Insufficient documentation

## 2019-01-11 DIAGNOSIS — I10 Essential (primary) hypertension: Secondary | ICD-10-CM | POA: Insufficient documentation

## 2019-01-11 DIAGNOSIS — I499 Cardiac arrhythmia, unspecified: Secondary | ICD-10-CM

## 2019-01-11 DIAGNOSIS — R05 Cough: Secondary | ICD-10-CM | POA: Insufficient documentation

## 2019-01-11 DIAGNOSIS — R1084 Generalized abdominal pain: Secondary | ICD-10-CM | POA: Insufficient documentation

## 2019-01-11 DIAGNOSIS — Z7982 Long term (current) use of aspirin: Secondary | ICD-10-CM | POA: Insufficient documentation

## 2019-01-11 DIAGNOSIS — E785 Hyperlipidemia, unspecified: Secondary | ICD-10-CM | POA: Insufficient documentation

## 2019-01-11 DIAGNOSIS — Z20822 Contact with and (suspected) exposure to covid-19: Secondary | ICD-10-CM

## 2019-01-11 LAB — URINALYSIS, REFLEX TO MICROSCOPIC EXAM IF INDICATED
Bilirubin, UA: NEGATIVE
Blood, UA: NEGATIVE
Glucose, UA: NEGATIVE
Ketones UA: NEGATIVE
Nitrite, UA: NEGATIVE
Protein, UR: 30 — AB
Specific Gravity UA: 1.009 (ref 1.001–1.035)
Urine pH: 8 (ref 5.0–8.0)
Urobilinogen, UA: NEGATIVE mg/dL (ref 0.2–2.0)

## 2019-01-11 LAB — COMPREHENSIVE METABOLIC PANEL
ALT: 19 U/L (ref 0–55)
AST (SGOT): 35 U/L — ABNORMAL HIGH (ref 5–34)
Albumin/Globulin Ratio: 1.4 (ref 0.9–2.2)
Albumin: 4.5 g/dL (ref 3.5–5.0)
Alkaline Phosphatase: 122 U/L — ABNORMAL HIGH (ref 37–106)
Anion Gap: 19 — ABNORMAL HIGH (ref 5.0–15.0)
BUN: 13 mg/dL (ref 7–19)
Bilirubin, Total: 0.3 mg/dL (ref 0.2–1.2)
CO2: 29 mEq/L (ref 22–29)
Calcium: 10.1 mg/dL (ref 7.9–10.2)
Chloride: 89 mEq/L — ABNORMAL LOW (ref 100–111)
Creatinine: 1 mg/dL (ref 0.6–1.0)
Globulin: 3.3 g/dL (ref 2.0–3.6)
Glucose: 106 mg/dL — ABNORMAL HIGH (ref 70–100)
Potassium: 3.5 mEq/L (ref 3.5–5.1)
Protein, Total: 7.8 g/dL (ref 6.0–8.3)
Sodium: 137 mEq/L (ref 136–145)

## 2019-01-11 LAB — CBC AND DIFFERENTIAL
Absolute NRBC: 0 10*3/uL (ref 0.00–0.00)
Basophils Absolute Automated: 0.04 10*3/uL (ref 0.00–0.08)
Basophils Automated: 0.4 %
Eosinophils Absolute Automated: 0.02 10*3/uL (ref 0.00–0.44)
Eosinophils Automated: 0.2 %
Hematocrit: 42.9 % (ref 34.7–43.7)
Hgb: 13.2 g/dL (ref 11.4–14.8)
Immature Granulocytes Absolute: 0.03 10*3/uL (ref 0.00–0.07)
Immature Granulocytes: 0.3 %
Lymphocytes Absolute Automated: 2.22 10*3/uL (ref 0.42–3.22)
Lymphocytes Automated: 19.9 %
MCH: 26.1 pg (ref 25.1–33.5)
MCHC: 30.8 g/dL — ABNORMAL LOW (ref 31.5–35.8)
MCV: 85 fL (ref 78.0–96.0)
MPV: 12.8 fL — ABNORMAL HIGH (ref 8.9–12.5)
Monocytes Absolute Automated: 0.89 10*3/uL — ABNORMAL HIGH (ref 0.21–0.85)
Monocytes: 8 %
Neutrophils Absolute: 7.95 10*3/uL — ABNORMAL HIGH (ref 1.10–6.33)
Neutrophils: 71.2 %
Nucleated RBC: 0 /100 WBC (ref 0.0–0.0)
Platelets: 338 10*3/uL (ref 142–346)
RBC: 5.05 10*6/uL (ref 3.90–5.10)
RDW: 13 % (ref 11–15)
WBC: 11.15 10*3/uL — ABNORMAL HIGH (ref 3.10–9.50)

## 2019-01-11 LAB — LIPASE: Lipase: 40 U/L (ref 8–78)

## 2019-01-11 LAB — TROPONIN I: Troponin I: 0.02 ng/mL (ref 0.00–0.05)

## 2019-01-11 LAB — GFR: EGFR: >60

## 2019-01-11 MED ORDER — IOHEXOL 350 MG/ML IV SOLN
100.00 mL | Freq: Once | INTRAVENOUS | Status: AC | PRN
Start: 2019-01-11 — End: 2019-01-11
  Administered 2019-01-11: 100 mL via INTRAVENOUS

## 2019-01-11 MED ORDER — SODIUM CHLORIDE 0.9 % IV BOLUS
500.00 mL | Freq: Once | INTRAVENOUS | Status: AC
Start: 2019-01-11 — End: 2019-01-11
  Administered 2019-01-11: 500 mL via INTRAVENOUS

## 2019-01-11 MED ORDER — ALUM & MAG HYDROXIDE-SIMETH 200-200-20 MG/5ML PO SUSP
20.00 mL | Freq: Once | ORAL | Status: AC
Start: 2019-01-11 — End: 2019-01-11
  Administered 2019-01-11: 20 mL via ORAL
  Filled 2019-01-11: qty 30

## 2019-01-11 MED ORDER — KETOROLAC TROMETHAMINE 30 MG/ML IJ SOLN
15.00 mg | Freq: Once | INTRAMUSCULAR | Status: AC
Start: 2019-01-11 — End: 2019-01-11
  Administered 2019-01-11: 15 mg via INTRAVENOUS
  Filled 2019-01-11: qty 1

## 2019-01-11 MED ORDER — LIDOCAINE VISCOUS HCL 2 % MT SOLN
10.00 mL | Freq: Once | OROMUCOSAL | Status: AC
Start: 2019-01-11 — End: 2019-01-11
  Administered 2019-01-11: 10 mL via OROMUCOSAL
  Filled 2019-01-11: qty 15

## 2019-01-11 MED ORDER — ONDANSETRON HCL 4 MG/2ML IJ SOLN
4.00 mg | Freq: Once | INTRAMUSCULAR | Status: AC
Start: 2019-01-11 — End: 2019-01-11
  Administered 2019-01-11: 4 mg via INTRAVENOUS
  Filled 2019-01-11: qty 2

## 2019-01-11 NOTE — ED Provider Notes (Signed)
SIGN OUT patient.     PHYSICAL EXAM     Vitals:    01/11/19 1910 01/11/19 1913 01/11/19 2023 01/11/19 2032   BP:   169/75 169/72   Pulse:   83 80   Resp: 17 16 16 16    Temp:   98.2 F (36.8 C) 98.2 F (36.8 C)   TempSrc:    Oral   SpO2: 94% 94% 97% 96%   Weight:         See Murrell Redden initial assessment.     Nursing note and vitals reviewed.  Constitutional:  Well developed, well nourished. Awake & Oriented x3.  Head:  Atraumatic. Normocephalic.    Eyes:  PERRL. EOMI. Conjunctivae are not pale.  ENT:  Mucous membranes are dry and intact. Oropharynx is clear and symmetric.  Patent airway.  Neck:  Supple. Full ROM.    Cardiovascular:  Regular rate. Regular rhythm.   Pulmonary/Chest:  No evidence of respiratory distress.   Extremities:  No edema. No cyanosis. No clubbing. Full range of motion in all extremities.  Skin:  Skin is warm and dry.  No diaphoresis. No rash.   Neurological:  Alert, awake, and appropriate. Normal speech. Motor normal.  Psychiatric:  Good eye contact. Normal interaction, affect, and behavior.     MEDICAL DECISION MAKING     DISCUSSION         Patient signed out to me from Chesley Mires, FNP at 1800 awaiting CTAP and labs.    CBC is un-actionable  Metabolic panel is un-actionable  Troponin is negative at 0.02  Urinalysis is free of blood, pyuria  Rapid influenza is negative  COVID testing pending  CTAP is normal    Discussed all results with patient and patient's son via phone. Discussed discharge home with self isolation and awaiting COVID results.     The patient is NOT septic.  All labs and vital signs from the current visit have been   reviewed and any abnormality that is present is not due to sepsis.    Vital Signs: Reviewed the patient's vital signs.   Nursing Notes: Reviewed and utilized available nursing notes.  Medical Records Reviewed: Reviewed available past medical records.  Counseling: The emergency provider has spoken with the patient and discussed today's findings, in addition to  providing specific details for the plan of care.  Questions are answered and there is agreement with the plan.      RADIOLOGY IMAGING STUDIES      CT Abd/ Pelvis with IV Contrast   Final Result         No acute abnormality detected. The appendix is not identified. No   findings to suggest acute appendicitis.      Carleene Overlie, MD    01/11/2019 7:47 PM      Chest AP Portable   Final Result    No evidence of acute cardiopulmonary abnormality.      Gustavus Messing, MD    01/11/2019 5:14 PM        PULSE OXIMETRY    Oxygen Saturation by Pulse Oximetry: 96%  Interventions: none  Interpretation:  Normal.     EMERGENCY DEPT. MEDICATIONS      ED Medication Orders (From admission, onward)    Start Ordered     Status Ordering Provider    01/11/19 1937 01/11/19 1938  iohexol (OMNIPAQUE) 350 MG/ML injection 100 mL  IMG once as needed     Route: Intravenous  Ordered Dose: 100 mL  Last [facility] action:  Imaging Agent Given YOON,  D    01/11/19 1621 01/11/19 1620  sodium chloride 0.9 % bolus 500 mL  Once     Route: Intravenous  Ordered Dose: 500 mL     Last MAR action:  Stopped YOON, GRACE D    01/11/19 1621 01/11/19 1620  ondansetron (ZOFRAN) injection 4 mg  Once     Route: Intravenous  Ordered Dose: 4 mg     Last MAR action:  Given YOON, GRACE D    01/11/19 1621 01/11/19 1620  alum & mag hydroxide-simethicone (MAALOX PLUS) 200-200-20 mg/5 mL suspension 20 mL  Once     Route: Oral  Ordered Dose: 20 mL     Last MAR action:  Given YOON, GRACE D    01/11/19 1621 01/11/19 1620  lidocaine viscous (XYLOCAINE) 2 % solution 10 mL  Once     Route: Mouth/Throat  Ordered Dose: 10 mL     Last MAR action:  Given YOON, GRACE D    01/11/19 1621 01/11/19 1620  ketorolac (TORADOL) injection 15 mg  Once     Route: Intravenous  Ordered Dose: 15 mg     Last MAR action:  Given YOON, GRACE D        LABORATORY RESULTS    Ordered and independently interpreted AVAILABLE laboratory tests. Please see results section in chart for full details.  Results for  orders placed or performed during the hospital encounter of 01/11/19   Coronavirus, COVID-19 ([facility])   Result Value Ref Range    SARS-CoV-2 Specimen Source Nasopharyngeal    Comprehensive metabolic panel   Result Value Ref Range    Glucose 106 (H) 70 - 100 mg/dL    BUN 13 7 - 19 mg/dL    Creatinine 1.0 0.6 - 1.0 mg/dL    Sodium 811 914 - 782 mEq/L    Potassium 3.5 3.5 - 5.1 mEq/L    Chloride 89 (L) 100 - 111 mEq/L    CO2 29 22 - 29 mEq/L    Calcium 10.1 7.9 - 10.2 mg/dL    Protein, Total 7.8 6.0 - 8.3 g/dL    Albumin 4.5 3.5 - 5.0 g/dL    AST (SGOT) 35 (H) 5 - 34 U/L    ALT 19 0 - 55 U/L    Alkaline Phosphatase 122 (H) 37 - 106 U/L    Bilirubin, Total 0.3 0.2 - 1.2 mg/dL    Globulin 3.3 2.0 - 3.6 g/dL    Albumin/Globulin Ratio 1.4 0.9 - 2.2    Anion Gap 19.0 (H) 5.0 - 15.0   CBC and differential   Result Value Ref Range    WBC 11.15 (H) 3.10 - 9.50 x10 3/uL    Hgb 13.2 11.4 - 14.8 g/dL    Hematocrit 95.6 21.3 - 43.7 %    Platelets 338 142 - 346 x10 3/uL    RBC 5.05 3.90 - 5.10 x10 6/uL    MCV 85.0 78.0 - 96.0 fL    MCH 26.1 25.1 - 33.5 pg    MCHC 30.8 (L) 31.5 - 35.8 g/dL    RDW 13 11 - 15 %    MPV 12.8 (H) 8.9 - 12.5 fL    Neutrophils 71.2 None %    Lymphocytes Automated 19.9 None %    Monocytes 8.0 None %    Eosinophils Automated 0.2 None %    Basophils Automated 0.4 None %    Immature Granulocyte 0.3 None %  Nucleated RBC 0.0 0.0 - 0.0 /100 WBC    Neutrophils Absolute 7.95 (H) 1.10 - 6.33 x10 3/uL    Abs Lymph Automated 2.22 0.42 - 3.22 x10 3/uL    Abs Mono Automated 0.89 (H) 0.21 - 0.85 x10 3/uL    Abs Eos Automated 0.02 0.00 - 0.44 x10 3/uL    Absolute Baso Automated 0.04 0.00 - 0.08 x10 3/uL    Absolute Immature Granulocyte 0.03 0.00 - 0.07 x10 3/uL    Absolute NRBC 0.00 0.00 - 0.00 x10 3/uL   Troponin I   Result Value Ref Range    Troponin I 0.02 0.00 - 0.05 ng/mL   Lipase   Result Value Ref Range    Lipase 40 8 - 78 U/L   GFR   Result Value Ref Range    EGFR >60.0    UA with reflex to micro (pts  3 +  yrs)   Result Value Ref Range    Urine Type Clean Catch     Color, UA Straw Colorless - Yellow    Clarity, UA Clear Clear - Hazy    Specific Gravity UA 1.009 1.001 - 1.035    Urine pH 8.0 5.0 - 8.0    Leukocyte Esterase, UA Trace (A) Negative    Nitrite, UA Negative Negative    Protein, UR 30 (A) Negative    Glucose, UA Negative Negative    Ketones UA Negative Negative    Urobilinogen, UA Negative 0.2 - 2.0 mg/dL    Bilirubin, UA Negative Negative    Blood, UA Negative Negative    RBC, UA 0 - 2 0 - 5 /hpf    WBC, UA 0 - 5 0 - 5 /hpf   ECG 12 lead   Result Value Ref Range    Ventricular Rate 80 BPM    Atrial Rate 80 BPM    P-R Interval 202 ms    QRS Duration 88 ms    Q-T Interval 384 ms    QTC Calculation (Bezet) 442 ms    P Axis 48 degrees    R Axis 30 degrees    T Axis -63 degrees     CRITICAL CARE/PROCEDURES    Procedures    DIAGNOSIS      Diagnosis:  Final diagnoses:   Cough   Generalized abdominal pain   Suspected Covid-19 Virus Infection     Disposition:  ED Disposition     ED Disposition Condition Date/Time Comment    Discharge  Fri Jan 11, 2019  8:27 PM Emily [name] discharge to home/self care.    Condition at disposition: Stable        Prescriptions:  Patient's Medications   New Prescriptions    No medications on file   Previous Medications    AMLODIPINE (NORVASC) 5 MG TABLET    Take 1 tablet (5 mg total) by mouth daily    ANASTROZOLE (ARIMIDEX) 1 MG TABLET    Take 1 tablet (1 mg total) by mouth daily    ASPIRIN 81 MG EC TABLET    Take 81 mg by mouth every morning        LEVOTHYROXINE (SYNTHROID, LEVOTHROID) 25 MCG TABLET    Take 25 mcg by mouth every morning.        LISINOPRIL (PRINIVIL,ZESTRIL) 20 MG TABLET    Take 1 tablet (20 mg total) by mouth 2 (two) times daily    MULTIPLE VITAMINS-MINERALS (MULTIVITAMIN WITH MINERALS) TABLET    Take 1 tablet by  mouth every morning        ROSUVASTATIN (CRESTOR) 40 MG TABLET    Take 1 tablet (40 mg total) by mouth daily    SIMVASTATIN (ZOCOR) 40 MG TABLET    TAKE 1  TABLET BY MOUTH ONCE DAILY IN THE MORNING    VITAMIN B-1 (THIAMINE) 100 MG TABLET    Take 100 mg by mouth every morning.   Modified Medications    No medications on file   Discontinued Medications    No medications on file     This note was generated by the Epic EMR system/ Dragon speech recognition and may contain inherent errors or omissions not intended by the user. Grammatical errors, random word insertions, deletions and pronoun errors  are occasional consequences of this technology due to software limitations. Not all errors are caught or corrected. If there are questions or concerns about the content of this note or information contained within the body of this dictation they should be addressed directly with the author for clarification.       Boone Master, FNP  01/11/19 2108

## 2019-01-11 NOTE — ED Notes (Signed)
MD at bedside. 

## 2019-01-11 NOTE — ED Notes (Signed)
Personal Protective Equipment (PPE)    N95, face shield, procedural gown, gloves

## 2019-01-11 NOTE — ED Provider Notes (Signed)
EMERGENCY DEPARTMENT NOTE       HISTORY OF PRESENT ILLNESS   Historian:Patient  Translator Used: no    Chief Complaint: Fever and Covid-19 Screening       73 y.o. female with a significant PMH of breast cancer with right breast mastectomy, HTN, hyperlipidemia, hypothyroidism, and vertigo presents with cough, subjective fever/chills, anterior chest pressure, epigastric pain with N/V, and generalized weakness for 3 days. Patient states chest pressure is only on exertion. Patient lives by herself. No recent travel or sick contacts.     1. Location of symptoms: upper respiratory, chest, abdomen   2. Onset of symptoms: 3 days  3. What was patient doing when symptoms started (Context): see above  4. Severity: moderate  5. Timing: acute  6. Activities that worsen symptoms: extertion  7. Activities that improve symptoms: rest  8. Quality: tender, pressure  9. Radiation of symptoms: no  10. Associated signs and Symptoms: see above  11. Are symptoms worsening? yes  MEDICAL HISTORY     Past Medical History:  Past Medical History:   Diagnosis Date   . Breast cancer 2014    right breast mastectomy   . GERD (gastroesophageal reflux disease)    . Hyperlipidemia    . Hypertension    . Hypothyroidism    . Malignant neoplasm of overlapping sites of right female breast 10/19/2015   . Vertigo        Past Surgical History:  Past Surgical History:   Procedure Laterality Date   . HYSTERECTOMY     . MASTECTOMY Right 2015       Social History:  Social History     Socioeconomic History   . Marital status: Widowed     Spouse name: Not on file   . Number of children: Not on file   . Years of education: Not on file   . Highest education level: Not on file   Occupational History   . Not on file   Social Needs   . Financial resource strain: Not on file   . Food insecurity:     Worry: Not on file     Inability: Not on file   . Transportation needs:     Medical: Not on file     Non-medical: Not on file   Tobacco Use   . Smoking status: Never Smoker    . Smokeless tobacco: Never Used   Substance and Sexual Activity   . Alcohol use: Yes     Alcohol/week: 1.0 standard drinks     Types: 1 Cans of beer per week     Comment: socially   . Drug use: No   . Sexual activity: Not on file   Lifestyle   . Physical activity:     Days per week: Not on file     Minutes per session: Not on file   . Stress: Not on file   Relationships   . Social connections:     Talks on phone: Not on file     Gets together: Not on file     Attends religious service: Not on file     Active member of club or organization: Not on file     Attends meetings of clubs or organizations: Not on file     Relationship status: Not on file   . Intimate partner violence:     Fear of current or ex partner: Not on file     Emotionally abused: Not on file  Physically abused: Not on file     Forced sexual activity: Not on file   Other Topics Concern   . Not on file   Social History Narrative   . Not on file       Family History:  Family History   Problem Relation Age of Onset   . Breast cancer Neg Hx        Outpatient Medication:  Discharge Medication List as of 01/11/2019  8:27 PM      CONTINUE these medications which have NOT CHANGED    Details   amLODIPine (NORVASC) 5 MG tablet Take 1 tablet (5 mg total) by mouth daily, Starting Thu 09/13/2018, Normal      anastrozole (ARIMIDEX) 1 MG tablet Take 1 tablet (1 mg total) by mouth daily, Starting Fri 01/19/2018, Normal      aspirin 81 MG EC tablet Take 81 mg by mouth every morning    , Historical Med      levothyroxine (SYNTHROID, LEVOTHROID) 25 MCG tablet Take 25 mcg by mouth every morning.    , Starting Wed 12/25/2013, Historical Med      lisinopril (PRINIVIL,ZESTRIL) 20 MG tablet Take 1 tablet (20 mg total) by mouth 2 (two) times daily, Starting Thu 09/13/2018, No Print      Multiple Vitamins-Minerals (MULTIVITAMIN WITH MINERALS) tablet Take 1 tablet by mouth every morning    , Historical Med      rosuvastatin (CRESTOR) 40 MG tablet Take 1 tablet (40 mg total)  by mouth daily, Starting Wed 11/14/2018, Normal      simvastatin (ZOCOR) 40 MG tablet TAKE 1 TABLET BY MOUTH ONCE DAILY IN THE MORNING, Normal      vitamin B-1 (THIAMINE) 100 MG tablet Take 100 mg by mouth every morning., Historical Med               REVIEW OF SYSTEMS   Review of Systems   Constitutional: Positive for chills, fever and malaise/fatigue.   Respiratory: Positive for cough. Negative for shortness of breath.    Cardiovascular: Positive for chest pain.   Gastrointestinal: Positive for abdominal pain, nausea and vomiting. Negative for diarrhea.   Genitourinary: Negative for dysuria, frequency, hematuria and urgency.   Neurological: Negative for dizziness and headaches.   All other systems reviewed and are negative.      PHYSICAL EXAM     ED Triage Vitals [01/11/19 1548]   Enc Vitals Group      BP 194/90      Heart Rate 83      Resp Rate 14      Temp 98.1 F (36.7 C)      Temp src       SpO2 97 %      Weight 71.6 kg      Height       Head Circumference       Peak Flow       Pain Score 0      Pain Loc       Pain Edu?       Excl. in GC?      Vitals:    01/11/19 1910 01/11/19 1913 01/11/19 2023 01/11/19 2032   BP:   169/75 169/72   Pulse:   83 80   Resp: 17 16 16 16    Temp:   98.2 F (36.8 C) 98.2 F (36.8 C)   TempSrc:    Oral   SpO2: 94% 94% 97% 96%   Weight:  Physical Exam   Constitutional: She is oriented to person, place, and time and well-developed, well-nourished, and in no distress. She appears not dehydrated.  Non-toxic appearance. She does not have a sickly appearance. No distress.   HENT:   Head: Normocephalic and atraumatic.   Right Ear: Tympanic membrane and external ear normal.   Left Ear: Tympanic membrane and external ear normal.   Nose: Nose normal.   Mouth/Throat: Uvula is midline, oropharynx is clear and moist and mucous membranes are normal.   Eyes: Pupils are equal, round, and reactive to light. Conjunctivae and EOM are normal.   Neck: Normal range of motion. Neck supple.    Cardiovascular: Normal rate, regular rhythm and normal heart sounds.   Pulmonary/Chest: Effort normal and breath sounds normal. No accessory muscle usage. No respiratory distress. She has no decreased breath sounds. She has no wheezes. She has no rales.   Abdominal: Soft. There is abdominal tenderness in the right lower quadrant and epigastric area. There is guarding. There is no rigidity, no rebound and no CVA tenderness.   Musculoskeletal: Normal range of motion.   Neurological: She is alert and oriented to person, place, and time. Gait normal. GCS score is 15.   Skin: Skin is warm and dry.   Psychiatric: Mood, memory, affect and judgment normal.   Nursing note and vitals reviewed.        MEDICAL DECISION MAKING     DISCUSSION        PPE: N95, procedural mask, goggles, gown, gloves. Low suspicion for COVID-19 virus - patient lives by herself. No recent travels or sick contacts.     Patient appeared nontoxic without respiratory distress. Epigastric and RLQ abdominal tenderness on exam. Afebrile in ED. Possible appendicitis or UTI.    CBC  CMP  Lipase  Troponin  UA  Rapid flu  CT abd/pelvis r/o appendicitis    Approval from Dr. Leane Platt to CT patient while on isolation precautions.     Patient turned over to Roanna Banning, FNP pending labs, CT results, and dispo.      Heart Score      Value   History  1   EKG  1   Risk Factors  1   Total (with age)  5   Onset of pain (time of START of last episode of chest pain)?  >6 hrs ago          The patient is NOT septic.  All labs and vital signs from the current visit have been   reviewed and any abnormality that is present is not due to sepsis.    Vital Signs: Reviewed the patient's vital signs.   Nursing Notes: Reviewed and utilized available nursing notes.  Medical Records Reviewed: Reviewed available past medical records.  Counseling: The emergency provider has spoken with the patient and discussed today's findings, in addition to providing specific details for the  plan of care.  Questions are answered and there is agreement with the plan.      CARDIAC STUDIES    The following cardiac studies were independently interpreted by the Emergency Medicine Physician.  For full cardiac study results please see chart.    Monitor Strip  Interpreted by me Murrell Converse)  Rate: 80  Rhythm: SR  ST Changes: none    EKG Interpretation:  Signed and interpreted by me Murrell Converse)  Time Interpreted: 1701  Comparison: sinus rhythm with sinus arrhythmia. Nonspecific ST and T wave - 01/01/2019  Rate: 80  Rhythm: SR  Axis: normal  Intervals: normal  Blocks: none  ST segments: no elevations; nonspecific T wave  Interpretation: Nonspecific  EKG    EMERGENCY IMAGING STUDIES    The following imagine studies were independently interpreted by me (emergency physician):    Radiology:  Interpreted by me Murrell Converse)  Study: Chest Xray   Results: No infiltrate. No pneumothorax. No hemothorax. No cardiomegaly. No CHF.  Impression: No acute intrathoracic abnormality.      RADIOLOGY IMAGING STUDIES      CT Abd/ Pelvis with IV Contrast   Final Result         No acute abnormality detected. The appendix is not identified. No   findings to suggest acute appendicitis.      Carleene Overlie, MD    01/11/2019 7:47 PM      Chest AP Portable   Final Result    No evidence of acute cardiopulmonary abnormality.      Gustavus Messing, MD    01/11/2019 5:14 PM            PULSE OXIMETRY    Oxygen Saturation by Pulse Oximetry: 96%  Interventions: none  Interpretation:  Stable on RA.     EMERGENCY DEPT. MEDICATIONS      ED Medication Orders (From admission, onward)    Start Ordered     Status Ordering Provider    01/11/19 1937 01/11/19 1938  iohexol (OMNIPAQUE) 350 MG/ML injection 100 mL  IMG once as needed     Route: Intravenous  Ordered Dose: 100 mL     Last MAR action:  Imaging Agent Given Malik Paar D    01/11/19 1621 01/11/19 1620  sodium chloride 0.9 % bolus 500 mL  Once     Route: Intravenous  Ordered Dose: 500 mL     Last MAR action:  Stopped Italo Banton,  Laylynn Campanella D    01/11/19 1621 01/11/19 1620  ondansetron (ZOFRAN) injection 4 mg  Once     Route: Intravenous  Ordered Dose: 4 mg     Last MAR action:  Given Reese Stockman D    01/11/19 1621 01/11/19 1620  alum & mag hydroxide-simethicone (MAALOX PLUS) 200-200-20 mg/5 mL suspension 20 mL  Once     Route: Oral  Ordered Dose: 20 mL     Last MAR action:  Given Davida Falconi D    01/11/19 1621 01/11/19 1620  lidocaine viscous (XYLOCAINE) 2 % solution 10 mL  Once     Route: Mouth/Throat  Ordered Dose: 10 mL     Last MAR action:  Given Tyyne Cliett D    01/11/19 1621 01/11/19 1620  ketorolac (TORADOL) injection 15 mg  Once     Route: Intravenous  Ordered Dose: 15 mg     Last MAR action:  Given Kolbie Clarkston D          LABORATORY RESULTS    Ordered and independently interpreted AVAILABLE laboratory tests. Please see results section in chart for full details.  Results for orders placed or performed during the hospital encounter of 01/11/19   Coronavirus, COVID-19 ([facility])   Result Value Ref Range    SARS-CoV-2 Specimen Source Nasopharyngeal    Comprehensive metabolic panel   Result Value Ref Range    Glucose 106 (H) 70 - 100 mg/dL    BUN 13 7 - 19 mg/dL    Creatinine 1.0 0.6 - 1.0 mg/dL    Sodium 161 096 - 045 mEq/L    Potassium 3.5 3.5 - 5.1  mEq/L    Chloride 89 (L) 100 - 111 mEq/L    CO2 29 22 - 29 mEq/L    Calcium 10.1 7.9 - 10.2 mg/dL    Protein, Total 7.8 6.0 - 8.3 g/dL    Albumin 4.5 3.5 - 5.0 g/dL    AST (SGOT) 35 (H) 5 - 34 U/L    ALT 19 0 - 55 U/L    Alkaline Phosphatase 122 (H) 37 - 106 U/L    Bilirubin, Total 0.3 0.2 - 1.2 mg/dL    Globulin 3.3 2.0 - 3.6 g/dL    Albumin/Globulin Ratio 1.4 0.9 - 2.2    Anion Gap 19.0 (H) 5.0 - 15.0   CBC and differential   Result Value Ref Range    WBC 11.15 (H) 3.10 - 9.50 x10 3/uL    Hgb 13.2 11.4 - 14.8 g/dL    Hematocrit 10.2 72.5 - 43.7 %    Platelets 338 142 - 346 x10 3/uL    RBC 5.05 3.90 - 5.10 x10 6/uL    MCV 85.0 78.0 - 96.0 fL    MCH 26.1 25.1 - 33.5 pg    MCHC 30.8 (L) 31.5 -  35.8 g/dL    RDW 13 11 - 15 %    MPV 12.8 (H) 8.9 - 12.5 fL    Neutrophils 71.2 None %    Lymphocytes Automated 19.9 None %    Monocytes 8.0 None %    Eosinophils Automated 0.2 None %    Basophils Automated 0.4 None %    Immature Granulocyte 0.3 None %    Nucleated RBC 0.0 0.0 - 0.0 /100 WBC    Neutrophils Absolute 7.95 (H) 1.10 - 6.33 x10 3/uL    Abs Lymph Automated 2.22 0.42 - 3.22 x10 3/uL    Abs Mono Automated 0.89 (H) 0.21 - 0.85 x10 3/uL    Abs Eos Automated 0.02 0.00 - 0.44 x10 3/uL    Absolute Baso Automated 0.04 0.00 - 0.08 x10 3/uL    Absolute Immature Granulocyte 0.03 0.00 - 0.07 x10 3/uL    Absolute NRBC 0.00 0.00 - 0.00 x10 3/uL   Troponin I   Result Value Ref Range    Troponin I 0.02 0.00 - 0.05 ng/mL   Lipase   Result Value Ref Range    Lipase 40 8 - 78 U/L   GFR   Result Value Ref Range    EGFR >60.0    UA with reflex to micro (pts  3 + yrs)   Result Value Ref Range    Urine Type Clean Catch     Color, UA Straw Colorless - Yellow    Clarity, UA Clear Clear - Hazy    Specific Gravity UA 1.009 1.001 - 1.035    Urine pH 8.0 5.0 - 8.0    Leukocyte Esterase, UA Trace (A) Negative    Nitrite, UA Negative Negative    Protein, UR 30 (A) Negative    Glucose, UA Negative Negative    Ketones UA Negative Negative    Urobilinogen, UA Negative 0.2 - 2.0 mg/dL    Bilirubin, UA Negative Negative    Blood, UA Negative Negative    RBC, UA 0 - 2 0 - 5 /hpf    WBC, UA 0 - 5 0 - 5 /hpf   ECG 12 lead   Result Value Ref Range    Ventricular Rate 80 BPM    Atrial Rate 80 BPM    P-R Interval 202 ms  QRS Duration 88 ms    Q-T Interval 384 ms    QTC Calculation (Bezet) 442 ms    P Axis 48 degrees    R Axis 30 degrees    T Axis -63 degrees       CRITICAL CARE/PROCEDURES    Procedures    DIAGNOSIS      Diagnosis:  Final diagnoses:   Cough   Generalized abdominal pain   Suspected Covid-19 Virus Infection       Disposition:  ED Disposition     ED Disposition Condition Date/Time Comment    Discharge  Fri Jan 11, 2019  8:27 PM  Patriciaann Clan Gunnarson discharge to home/self care.    Condition at disposition: Stable          Prescriptions:  Discharge Medication List as of 01/11/2019  8:27 PM      CONTINUE these medications which have NOT CHANGED    Details   amLODIPine (NORVASC) 5 MG tablet Take 1 tablet (5 mg total) by mouth daily, Starting Thu 09/13/2018, Normal      anastrozole (ARIMIDEX) 1 MG tablet Take 1 tablet (1 mg total) by mouth daily, Starting Fri 01/19/2018, Normal      aspirin 81 MG EC tablet Take 81 mg by mouth every morning    , Historical Med      levothyroxine (SYNTHROID, LEVOTHROID) 25 MCG tablet Take 25 mcg by mouth every morning.    , Starting Wed 12/25/2013, Historical Med      lisinopril (PRINIVIL,ZESTRIL) 20 MG tablet Take 1 tablet (20 mg total) by mouth 2 (two) times daily, Starting Thu 09/13/2018, No Print      Multiple Vitamins-Minerals (MULTIVITAMIN WITH MINERALS) tablet Take 1 tablet by mouth every morning    , Historical Med      rosuvastatin (CRESTOR) 40 MG tablet Take 1 tablet (40 mg total) by mouth daily, Starting Wed 11/14/2018, Normal      simvastatin (ZOCOR) 40 MG tablet TAKE 1 TABLET BY MOUTH ONCE DAILY IN THE MORNING, Normal      vitamin B-1 (THIAMINE) 100 MG tablet Take 100 mg by mouth every morning., Historical Med             This note was generated by the Epic EMR system/ Dragon speech recognition and may contain inherent errors or omissions not intended by the user. Grammatical errors, random word insertions, deletions and pronoun errors  are occasional consequences of this technology due to software limitations. Not all errors are caught or corrected. If there are questions or concerns about the content of this note or information contained within the body of this dictation they should be addressed directly with the author for clarification.     Sheldon Silvan, FNP  01/12/19 1911

## 2019-01-11 NOTE — EDIE (Signed)
COLLECTIVE?NOTIFICATION?01/11/2019 15:31?Emily, Hettinger [name]?MRN: [id number]    [place] [facility]'s patient encounter information:   VWU:[id number]  Account [id number]  Billing Account [id number]      Criteria Met      5 ED Visits in 12 Months    Security and Safety  No recent Security Events currently on file    ED Care Guidelines  There are currently no ED Care Guidelines for this patient. Please check your facility's medical records system.        Prescription Monitoring Program  PDMP query found no report.  Narx Score not available at this time.      E.D. Visit Count (12 mo.)  Facility Visits   [place] [facility] 7   Total 7   Note: Visits indicate total known visits.      Recent Emergency Department Visit Summary  Date Facility [facility] Type Diagnoses or Chief Complaint   Jan 11, 2019 [facility]      triage- cough      Jan 01, 2019 [place] [facility] [place] [facility] [place] [facility]      legs swelling, rapid heart rate      Chest Pain      Leg Swelling      Localized edema      Palpitations      Nov 13, 2018 [place] [facility] [facility] [place] [facility]      CHEST PAIN, ARM TINGLING      Chest Pain      Anesthesia of skin      Jun 20, 2018 [place] [facility]      fell at home, injuring head, face, right knee, feels lightheaded      Fall      Other chest pain      Fall (on)(from) sidewalk curb, initial encounter      Pain in right knee      Pain in right shoulder      Other injury of unspecified body region, initial encounter      May 14, 2018 [place] [facility] [place] [facility]      dizziness, sycope      Extremity Weakness      Dizziness      Weakness      Apr 03, 2018 [place] [facility] [place] [facility] [place] [facility]      medic      Generalized weakness      Nausea      Dizziness and giddiness      Unspecified nystagmus      Weakness      Feb 07, 2018 [place] [facility] [place] [facility] [place] [facility]      Triage-Weakness       Generalized Body Aches      Acute bronchitis, unspecified          Recent Inpatient Visit Summary  Date Facility [facility] Type Diagnoses or Chief Complaint   Apr 03, 2018 [place] [facility] [place] [facility]      Unspecified nystagmus      Dizziness and giddiness      Weakness      Nausea          Care Team  Provider Specialty Phone Fax Service Dates   DAVIDSON, Janet Berlin MD M, MD Family Medicine: Adult Medicine [phone] Current  Collective Portal  This patient has registered at the [facility] Department   For more information visit: [email or web address]     PLEASE NOTE:     1.   Any care recommendations and other clinical information are provided as guidelines or for historical purposes only, and providers should exercise their own clinical judgment when providing care.    2.   You may only use this information for purposes of treatment, payment or health care operations activities, and subject to the limitations of applicable Collective Policies.    3.   You should consult directly with the organization that provided a care guideline or other clinical history with any questions about additional information or accuracy or completeness of information provided.    ? 2020 [place], [employer] - [email or web address]

## 2019-01-12 LAB — ECG 12-LEAD
Atrial Rate: 80 {beats}/min
P Axis: 48 degrees
P-R Interval: 202 ms
Q-T Interval: 384 ms
QRS Duration: 88 ms
QTC Calculation (Bezet): 442 ms
R Axis: 30 degrees
T Axis: -63 degrees
Ventricular Rate: 80 {beats}/min

## 2019-01-13 LAB — CORONAVIRUS, COVID-19: SARS CoV 2 Overall Result: NOT DETECTED

## 2019-01-14 ENCOUNTER — Telehealth: Payer: Self-pay

## 2019-01-14 NOTE — Telephone Encounter (Signed)
COVID-19 Notification Team    Placed call to patient in an effort to provide COVID-19 test result.  Attempted to reach patient at the following numbers:    Left the following voicemail for patient:    "My name is Janeece Agee and I am with [facility] calling to provide you with results from your recent test at [facility].  Please call me back as soon as possible so that I can provide you with the results of your test.  My telephone number is [phone] and I can be reached between the hours of 8:30am--5:00pm.  Thank you."    CMA will call again in the next 24 hours per protocol.    Abundio Miu CMA   TCM Medicare Coordinator  [facility]  [address]  [place], [place] [address]  T [phone] F [phone]

## 2019-01-15 ENCOUNTER — Telehealth: Payer: Self-pay

## 2019-01-15 NOTE — Telephone Encounter (Signed)
COVID-19 Notification Team    Spoke with patient to provide NEGATIVE COVID-19 test result.      After verifying patient name, address and DOB, patient was provided with the negative result.    Confirmed that patient's primary care physician is Skeet Simmer, MD [phone] .  Routed Epic note to primary care physician.    Patient was advised to follow CDC guidance on social distancing and hand washing.    Advised patient to contact their primary care physician  in the event that patient's symptoms change or worsen.  Reminded patient that the emergency room is always available in the event of an emergency.      Abundio Miu CMA   TCM Medicare Coordinator  [facility]  [address]  [place], [place] [address]  T [phone] F [phone]

## 2019-01-17 ENCOUNTER — Other Ambulatory Visit: Payer: Self-pay | Admitting: Family Medicine

## 2019-01-23 ENCOUNTER — Other Ambulatory Visit: Payer: Self-pay | Admitting: Hematology & Oncology

## 2019-01-23 DIAGNOSIS — C50919 Malignant neoplasm of unspecified site of unspecified female breast: Secondary | ICD-10-CM

## 2019-01-28 ENCOUNTER — Ambulatory Visit
Admission: RE | Admit: 2019-01-28 | Discharge: 2019-01-28 | Disposition: A | Discharge status: Home / Self Care | Payer: Medicare Other | Source: Ambulatory Visit | Attending: Hematology & Oncology | Admitting: Hematology & Oncology

## 2019-01-28 DIAGNOSIS — C50919 Malignant neoplasm of unspecified site of unspecified female breast: Secondary | ICD-10-CM

## 2019-01-28 DIAGNOSIS — Z13820 Encounter for screening for osteoporosis: Secondary | ICD-10-CM | POA: Insufficient documentation

## 2019-01-28 DIAGNOSIS — M85852 Other specified disorders of bone density and structure, left thigh: Secondary | ICD-10-CM | POA: Insufficient documentation

## 2019-01-28 DIAGNOSIS — M810 Age-related osteoporosis without current pathological fracture: Secondary | ICD-10-CM

## 2019-02-01 ENCOUNTER — Ambulatory Visit
Admission: RE | Admit: 2019-02-01 | Discharge: 2019-02-01 | Disposition: A | Discharge status: Home / Self Care | Payer: Medicare Other | Source: Ambulatory Visit | Attending: Hematology & Oncology | Admitting: Hematology & Oncology

## 2019-02-01 DIAGNOSIS — C50911 Malignant neoplasm of unspecified site of right female breast: Secondary | ICD-10-CM | POA: Insufficient documentation

## 2019-02-01 DIAGNOSIS — R918 Other nonspecific abnormal finding of lung field: Secondary | ICD-10-CM | POA: Insufficient documentation

## 2019-02-01 DIAGNOSIS — Z9011 Acquired absence of right breast and nipple: Secondary | ICD-10-CM | POA: Insufficient documentation

## 2019-02-01 MED ORDER — IOHEXOL 350 MG/ML IV SOLN
100.00 mL | Freq: Once | INTRAVENOUS | Status: AC | PRN
Start: 2019-02-01 — End: 2019-02-01
  Administered 2019-02-01: 100 mL via INTRAVENOUS

## 2019-02-04 ENCOUNTER — Ambulatory Visit: Payer: Medicare Other

## 2019-02-15 ENCOUNTER — Inpatient Hospital Stay: Admission: RE | Admit: 2019-02-15 | Payer: Medicare Other | Source: Ambulatory Visit | Admitting: Surgery

## 2019-02-15 ENCOUNTER — Encounter: Admission: RE | Payer: Self-pay | Source: Ambulatory Visit

## 2019-02-15 SURGERY — RECONSTRUCTION, BREAST, DIEP FLAP
Anesthesia: General | Site: Breast | Laterality: Right

## 2019-02-21 ENCOUNTER — Telehealth (INDEPENDENT_AMBULATORY_CARE_PROVIDER_SITE_OTHER): Payer: Self-pay | Admitting: Cardiology

## 2019-02-21 NOTE — Telephone Encounter (Signed)
Called and left message for patient to call back to schedule for video visit with Dr. Franchot Erichsen.Emily [name]

## 2019-02-22 ENCOUNTER — Emergency Department: Payer: Medicare Other

## 2019-02-22 ENCOUNTER — Emergency Department
Admission: EM | Admit: 2019-02-22 | Discharge: 2019-02-22 | Disposition: A | Discharge status: Home / Self Care | Payer: Medicare Other | Attending: Emergency Medicine | Admitting: Emergency Medicine

## 2019-02-22 ENCOUNTER — Encounter: Payer: Self-pay | Admitting: Family

## 2019-02-22 DIAGNOSIS — R52 Pain, unspecified: Secondary | ICD-10-CM

## 2019-02-22 DIAGNOSIS — L905 Scar conditions and fibrosis of skin: Secondary | ICD-10-CM

## 2019-02-22 DIAGNOSIS — Z79811 Long term (current) use of aromatase inhibitors: Secondary | ICD-10-CM | POA: Insufficient documentation

## 2019-02-22 DIAGNOSIS — C50811 Malignant neoplasm of overlapping sites of right female breast: Secondary | ICD-10-CM | POA: Insufficient documentation

## 2019-02-22 DIAGNOSIS — I10 Essential (primary) hypertension: Secondary | ICD-10-CM | POA: Insufficient documentation

## 2019-02-22 DIAGNOSIS — Z7982 Long term (current) use of aspirin: Secondary | ICD-10-CM | POA: Insufficient documentation

## 2019-02-22 DIAGNOSIS — E785 Hyperlipidemia, unspecified: Secondary | ICD-10-CM | POA: Insufficient documentation

## 2019-02-22 DIAGNOSIS — E039 Hypothyroidism, unspecified: Secondary | ICD-10-CM | POA: Insufficient documentation

## 2019-02-22 DIAGNOSIS — Z9011 Acquired absence of right breast and nipple: Secondary | ICD-10-CM | POA: Insufficient documentation

## 2019-02-22 LAB — CREATININE WHOLE BLOOD: Whole Blood Creatinine: 0.7 mg/dL (ref 0.6–1.0)

## 2019-02-22 LAB — CBC
Absolute NRBC: 0 10*3/uL (ref 0.00–0.00)
Hematocrit: 39.1 % (ref 34.7–43.7)
Hgb: 12.4 g/dL (ref 11.4–14.8)
MCH: 26.4 pg (ref 25.1–33.5)
MCHC: 31.7 g/dL (ref 31.5–35.8)
MCV: 83.4 fL (ref 78.0–96.0)
MPV: 11.5 fL (ref 8.9–12.5)
Nucleated RBC: 0 /100 WBC (ref 0.0–0.0)
Platelets: 388 10*3/uL — ABNORMAL HIGH (ref 142–346)
RBC: 4.69 10*6/uL (ref 3.90–5.10)
RDW: 13 % (ref 11–15)
WBC: 10.04 10*3/uL — ABNORMAL HIGH (ref 3.10–9.50)

## 2019-02-22 LAB — GFR: GFR: >60 mg/dL

## 2019-02-22 MED ORDER — LIDOCAINE 5 % EX PTCH
1.00 | MEDICATED_PATCH | Freq: Two times a day (BID) | CUTANEOUS | 0 refills | Status: DC | PRN
Start: 2019-02-22 — End: 2019-08-08

## 2019-02-22 MED ORDER — ACETAMINOPHEN 500 MG PO TABS
1000.00 mg | ORAL_TABLET | Freq: Once | ORAL | Status: AC
Start: 2019-02-22 — End: 2019-02-22
  Administered 2019-02-22: 1000 mg via ORAL
  Filled 2019-02-22: qty 2

## 2019-02-22 MED ORDER — LIDOCAINE 5 % EX PTCH
1.00 | MEDICATED_PATCH | CUTANEOUS | Status: DC
Start: 2019-02-22 — End: 2019-02-22
  Administered 2019-02-22: 1 via TRANSDERMAL
  Filled 2019-02-22: qty 1

## 2019-02-22 MED ORDER — IOHEXOL 350 MG/ML IV SOLN
100.00 mL | Freq: Once | INTRAVENOUS | Status: AC | PRN
Start: 2019-02-22 — End: 2019-02-22
  Administered 2019-02-22: 80 mL via INTRAVENOUS

## 2019-02-22 MED ORDER — NAPROXEN 250 MG PO TABS
500.00 mg | ORAL_TABLET | Freq: Two times a day (BID) | ORAL | 0 refills | Status: AC | PRN
Start: 2019-02-22 — End: 2019-03-04

## 2019-02-22 NOTE — EDIE (Signed)
COLLECTIVE?NOTIFICATION?02/22/2019 12:04?Emily [name], Emily [name]?MRN: [id number]    [place] [facility]'s patient encounter information:   VWU:?[id number]  Account [id number]  Billing Account [id number]      Criteria Met      5 ED Visits in 12 Months    Security and Safety  No recent Security Events currently on file    ED Care Guidelines  There are currently no ED Care Guidelines for this patient. Please check your facility's medical records system.    Flags      Negative COVID-19 Lab Result - [facility] - A specimen collected from this patient was negative for COVID-19 / Attributed By: [place] Department of Health / Attributed On: 01/15/2019       Prescription Monitoring Program  PDMP query found no report.  Narx Score not available at this time.      E.D. Visit Count (12 mo.)  Facility Visits   [place] [facility] 7   Total 7   Note: Visits indicate total known visits.      Recent Emergency Department Visit Summary  Date Facility [facility] Type Diagnoses or Chief Complaint   Feb 22, 2019 [place] [facility]      post op complications, rt breast      Jan 11, 2019 [place] [facility]      triage- cough      triage- cough, fever      Fever      Covid-19 Screening      Other general symptoms and signs      Cough      Generalized abdominal pain      Jan 01, 2019 [place] [facility]      legs swelling, rapid heart rate      Chest Pain      Leg Swelling      Localized edema      Palpitations      Nov 13, 2018 [place] [facility] [facility]      CHEST PAIN, ARM TINGLING      Chest Pain      Anesthesia of skin      Jun 20, 2018 [place] [facility]      fell at home, injuring head, face, right knee, feels lightheaded      Fall      Other chest pain      Fall (on)(from) sidewalk curb, initial encounter      Pain in right knee      Pain in right shoulder      Other injury of unspecified body  region, initial encounter      May 14, 2018 [place] [facility]      dizziness, sycope      Extremity Weakness      Dizziness      Weakness      Apr 03, 2018 [place] [facility]      medic      Generalized weakness      Nausea      Dizziness and giddiness      Unspecified nystagmus      Weakness          Recent Inpatient Visit Summary  Date Facility [facility] Type Diagnoses or Chief Complaint   Apr 03, 2018  - [place] [facility] [place]  [facility]      Unspecified nystagmus      Dizziness and giddiness      Weakness      Nausea          Care Team  Provider Specialty Phone Fax Service Dates   DAVIDSON, Janet Berlin MD M, MD Family Medicine: Adult Medicine [phone] Current      Collective Portal  This patient has registered at the [facility] Emergency Department   For more information visit: [email or web address]     PLEASE NOTE:     1.   Any care recommendations and other clinical information are provided as guidelines or for historical purposes only, and providers should exercise their own clinical judgment when providing care.    2.   You may only use this information for purposes of treatment, payment or health care operations activities, and subject to the limitations of applicable Collective Policies.    3.   You should consult directly with the organization that provided a care guideline or other clinical history with any questions about additional information or accuracy or completeness of information provided.    ? 2020 [place], [employer] - [email or web address]

## 2019-02-22 NOTE — ED Provider Notes (Signed)
EMERGENCY DEPARTMENT NOTE       HISTORY OF PRESENT ILLNESS   Historian:Patient  Translator Used: No    Chief Complaint: Breast Pain     Mechanism of Injury:       73 y.o. female history of breast cancer with right mastectomy, GERD hypertension presents for 1 week intermittent burning pain to right scar tissue right armpit from previous mastectomy, patient reports taking 2 Advil with mild relief.  Patient reports pain is worse at night better during the day has become constant in character over the past 2 days.     1. Location of symptoms:   2. Onset of symptoms:   3. What was patient doing when symptoms started (Context): see above  4. Severity: moderate  5. Timing: constant  6. Activities that worsen symptoms: Raising right arm lifting with right arm  7. Activities that improve symptoms: Advil mild reduction in pain   8. quality: Burning  9. Radiation of symptoms: no  10. Associated signs and Symptoms: see above  11. Are symptoms worsening? yes  MEDICAL HISTORY     Past Medical History:  Past Medical History:   Diagnosis Date   . Breast cancer 2014    right breast mastectomy   . GERD (gastroesophageal reflux disease)    . Hyperlipidemia    . Hypertension    . Hypothyroidism    . Malignant neoplasm of overlapping sites of right female breast 10/19/2015   . Vertigo        Past Surgical History:  Past Surgical History:   Procedure Laterality Date   . HYSTERECTOMY     . MASTECTOMY Right 2015       Social History:  Social History     Socioeconomic History   . Marital status: Widowed     Spouse name: Not on file   . Number of children: Not on file   . Years of education: Not on file   . Highest education level: Not on file   Occupational History   . Not on file   Social Needs   . Financial resource strain: Not on file   . Food insecurity:     Worry: Not on file     Inability: Not on file   . Transportation needs:     Medical: Not on file     Non-medical: Not on file   Tobacco Use   . Smoking status: Never Smoker   .  Smokeless tobacco: Never Used   Substance and Sexual Activity   . Alcohol use: Yes     Alcohol/week: 1.0 standard drinks     Types: 1 Cans of beer per week     Comment: socially   . Drug use: No   . Sexual activity: Not on file   Lifestyle   . Physical activity:     Days per week: Not on file     Minutes per session: Not on file   . Stress: Not on file   Relationships   . Social connections:     Talks on phone: Not on file     Gets together: Not on file     Attends religious service: Not on file     Active member of club or organization: Not on file     Attends meetings of clubs or organizations: Not on file     Relationship status: Not on file   . Intimate partner violence:     Fear of current or ex partner:  Not on file     Emotionally abused: Not on file     Physically abused: Not on file     Forced sexual activity: Not on file   Other Topics Concern   . Not on file   Social History Narrative   . Not on file       Family History:  Family History   Problem Relation Age of Onset   . Breast cancer Neg Hx        Outpatient Medication:  Discharge Medication List as of 02/22/2019  2:34 PM      CONTINUE these medications which have NOT CHANGED    Details   amLODIPine (NORVASC) 5 MG tablet Take 1 tablet (5 mg total) by mouth daily, Starting Thu 09/13/2018, Normal      anastrozole (ARIMIDEX) 1 MG tablet Take 1 tablet (1 mg total) by mouth daily, Starting Fri 01/19/2018, Normal      aspirin 81 MG EC tablet Take 81 mg by mouth every morning    , Historical Med      levothyroxine (SYNTHROID, LEVOTHROID) 25 MCG tablet Take 25 mcg by mouth every morning.    , Starting Wed 12/25/2013, Historical Med      lisinopril (PRINIVIL,ZESTRIL) 20 MG tablet Take 1 tablet (20 mg total) by mouth 2 (two) times daily, Starting Thu 09/13/2018, No Print      Multiple Vitamins-Minerals (MULTIVITAMIN WITH MINERALS) tablet Take 1 tablet by mouth every morning    , Historical Med      rosuvastatin (CRESTOR) 40 MG tablet Take 1 tablet (40 mg total) by  mouth daily, Starting Wed 11/14/2018, Normal      simvastatin (ZOCOR) 40 MG tablet TAKE 1 TABLET BY MOUTH ONCE DAILY IN THE MORNING, Normal      vitamin B-1 (THIAMINE) 100 MG tablet Take 100 mg by mouth every morning., Historical Med               REVIEW OF SYSTEMS   Review of Systems   Respiratory: Negative for cough, hemoptysis, sputum production, shortness of breath and wheezing.    Cardiovascular: Negative for chest pain, palpitations, orthopnea, claudication and leg swelling.   Genitourinary: Negative for flank pain.   Skin:        Pain to skin right axilla right upper chest        Review of Systems   Constitutional: Negative for chills, fever and malaise/fatigue.   Respiratory: Negative for cough.    Cardiovascular: Negative for chest pain and palpitations.   Gastrointestinal: Negative for nausea and vomiting.   Genitourinary: Negative for dysuria and urgency.   Musculoskeletal: Negative for back pain and myalgias.   Neurological: Negative for dizziness, loss of consciousness, weakness and headaches.   Endo/Heme/Allergies: Does not bruise/bleed easily.   All other systems reviewed and are negative.      PHYSICAL EXAM     ED Triage Vitals [02/22/19 1210]   Enc Vitals Group      BP 176/81      Heart Rate 91      Resp Rate 16      Temp 98.8 F (37.1 C)      Temp Source Oral      SpO2 96 %      Weight       Height       Head Circumference       Peak Flow       Pain Score 10      Pain Loc  Pain Edu?       Excl. in GC?      Nursing note and vitals reviewed.  Constitutional:  Well developed, well nourished. Awake & Oriented x3.  Head:  Atraumatic. Normocephalic.    Eyes:  PERRL. EOMI. Conjunctivae are not pale.  ENT:  Mucous membranes are moist and intact. Oropharynx is clear and symmetric.  Patent airway.  Neck:  Supple. Full ROM.    Cardiovascular:  Regular rate. Regular rhythm. No murmurs, rubs, or gallops.  Pulmonary/Chest:  No evidence of respiratory distress. Clear to auscultation bilaterally.  No  wheezing, rales or rhonchi.   Abdominal:  Soft and non-distended. There is no tenderness. No rebound, guarding, or rigidity.  Back:  Full ROM. Nontender.  Extremities:  No edema. No cyanosis. No clubbing. Full range of motion in all extremities.  Skin:  Skin is warm and dry.  No diaphoresis. No rash.   Neurological:  Alert, awake, and appropriate. Normal speech. Motor normal.  Psychiatric:  Good eye contact. Normal interaction, affect, and behavior.  Physical Exam   Pulmonary/Chest: Chest wall is not dull to percussion. She exhibits tenderness and bony tenderness. She exhibits no mass, no laceration, no crepitus, no edema, no deformity, no swelling and no retraction.             MEDICAL DECISION MAKING     DISCUSSION    Well-appearing no acute distress 73 year old female with history of malignant neoplasm to right breast with mastectomy.  Patient reports mastectomy 5 years ago, intermittent recurrent pain to site that has recurred with worsening feeling for 1 week.  Differential diagnosis PE, neuropathic pain scar tissue pain, recurrent neoplasm to chest wall lung  CBC ordered for rule out infectious etiology  CT chest rule out PE,  Neoplasm    CBC CMP reviewed no acute findings  CT negative acute findings new findings.  Patient is aware of thyroid nodule, and is currently under care of oncology.    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          Vital Signs: Reviewed the patient's vital signs.   Nursing Notes: Reviewed and utilized available nursing notes.  Medical Records Reviewed: Reviewed available past medical records.  Counseling: The emergency provider has spoken with the patient and discussed today's findings, in addition to providing specific details for the plan of care.  Questions are answered and there is agreement with  the plan.      MIPS DOCUMENTATION              CARDIAC STUDIES    The following cardiac studies were independently interpreted by the Emergency Medicine Physician.  For full cardiac study results please see chart.        EMERGENCY IMAGING STUDIES    The following imagine studies were independently interpreted by me (emergency physician):        RADIOLOGY IMAGING STUDIES      CT Angio Chest (PE study)   Final Result      1. No acute abnormality in the thorax.   2. No evidence for pulmonary embolism.   3. Right thyroid nodule. Further assessment with ultrasound could be   performed.      Wyatt Portela, MD    02/22/2019 2:03 PM              PULSE OXIMETRY  Oxygen Saturation by Pulse Oximetry: 98%  Interventions: none  Interpretation:  wnl    EMERGENCY DEPT. MEDICATIONS      ED Medication Orders (From admission, onward)    Start Ordered     Status Ordering Provider    02/22/19 1434 02/22/19 1434    Every 24 hours     Route: Transdermal  Ordered Dose: 1 patch     Discontinued Harrie Cazarez A    02/22/19 1354 02/22/19 1354  iohexol (OMNIPAQUE) 350 MG/ML injection 100 mL  IMG once as needed     Route: Intravenous  Ordered Dose: 100 mL     Last MAR action:  Imaging Agent Given Valene Villa A    02/22/19 1249 02/22/19 1248  acetaminophen (TYLENOL) tablet 1,000 mg  Once     Route: Oral  Ordered Dose: 1,000 mg     Last MAR action:  Given Hakeen Shipes A          LABORATORY RESULTS    Ordered and independently interpreted AVAILABLE laboratory tests. Please see results section in chart for full details.  Results for orders placed or performed during the hospital encounter of 02/22/19   CREATININE WHOLE BLOOD   Result Value Ref Range    Creatinine Whole Blood 0.7 0.6 - 1.0 mg/dL   CBC without differential   Result Value Ref Range    WBC 10.04 (H) 3.10 - 9.50 x10 3/uL    Hgb 12.4 11.4 - 14.8 g/dL    Hematocrit 09.8 11.9 - 43.7 %    Platelets 388 (H) 142 - 346 x10 3/uL    RBC 4.69 3.90 - 5.10 x10 6/uL    MCV 83.4 78.0 - 96.0  fL    MCH 26.4 25.1 - 33.5 pg    MCHC 31.7 31.5 - 35.8 g/dL    RDW 13 11 - 15 %    MPV 11.5 8.9 - 12.5 fL    Nucleated RBC 0.0 0.0 - 0.0 /100 WBC    Absolute NRBC 0.00 0.00 - 0.00 x10 3/uL   GFR   Result Value Ref Range    GFR >60.0 mg/dL       CRITICAL CARE/PROCEDURES    Procedures      DIAGNOSIS      Diagnosis:  Final diagnoses:   Pain in surgical scar       Disposition:  ED Disposition     ED Disposition Condition Date/Time Comment    Discharge  Fri Feb 22, 2019  2:34 PM Emily [name] discharge to home/self care.    Condition at disposition: Stable          Prescriptions:  Discharge Medication List as of 02/22/2019  2:34 PM      START taking these medications    Details   lidocaine (LIDODERM) 5 % Place 1 patch onto the skin every 12 (twelve) hours as needed (pain) Remove & Discard patch within 12 hours or as directed by MD, Starting Fri 02/22/2019, Normal      naproxen (NAPROSYN) 250 MG tablet Take 2 tablets (500 mg total) by mouth every 12 (twelve) hours as needed (pain), Starting Fri 02/22/2019, Until Mon 03/04/2019, Normal         CONTINUE these medications which have NOT CHANGED    Details   amLODIPine (NORVASC) 5 MG tablet Take 1 tablet (5 mg total) by mouth daily, Starting Thu 09/13/2018, Normal      anastrozole (ARIMIDEX) 1 MG tablet Take 1 tablet (1 mg total) by  mouth daily, Starting Fri 01/19/2018, Normal      aspirin 81 MG EC tablet Take 81 mg by mouth every morning    , Historical Med      levothyroxine (SYNTHROID, LEVOTHROID) 25 MCG tablet Take 25 mcg by mouth every morning.    , Starting Wed 12/25/2013, Historical Med      lisinopril (PRINIVIL,ZESTRIL) 20 MG tablet Take 1 tablet (20 mg total) by mouth 2 (two) times daily, Starting Thu 09/13/2018, No Print      Multiple Vitamins-Minerals (MULTIVITAMIN WITH MINERALS) tablet Take 1 tablet by mouth every morning    , Historical Med      rosuvastatin (CRESTOR) 40 MG tablet Take 1 tablet (40 mg total) by mouth daily, Starting Wed 11/14/2018, Normal       simvastatin (ZOCOR) 40 MG tablet TAKE 1 TABLET BY MOUTH ONCE DAILY IN THE MORNING, Normal      vitamin B-1 (THIAMINE) 100 MG tablet Take 100 mg by mouth every morning., Historical Med              Peterson Ao, FNP  02/23/19 1420

## 2019-02-22 NOTE — Discharge Instructions (Signed)
Cold Therapy, Ice    One thing you can do to help your injury or pain is using cold therapy, such as with ice.     You can apply cold or ice to an injured or inflamed area of the body. This can help constrict the blood vessels in the area. This can result in less blood flow to that body part. This may result in less swelling or pain.     The reduction in blood flow and inflammation from cold therapy is temporary. However, it may give you significant relief from pain and discomfort. It can also help you get better faster.    You can use a store-bought cold pack. You can also make your own with ice or even frozen vegetables. The easiest way is to make your own cold pack with ice:   Place some ice cubes in a re-sealable (Ziploc) bag and add some water. Put a thin washcloth between the bag and the skin. Apply the ice bag to the area for at least 20 minutes. Do this at least 4 times per day. It is okay to do this more often than directed. You can also do it for longer than directed.    It is very important to NEVER APPLY ICE DIRECTLY TO THE SKIN.                Pain, General    You have been seen today for treatment of your pain.    The physician who treated you does not feel that the cause of your pain is dangerous. It is safe to treat your pain symptoms.    Your doctor prescribed medication to treat your pain. Use the pain medication as needed. It may help to take the pain medications regularly, around the clock as directed for the next a few days. By doing this, you can build up a helpful amount of the medicine in your system.    YOU SHOULD SEEK MEDICAL ATTENTION IMMEDIATELY, EITHER HERE OR AT THE NEAREST EMERGENCY DEPARTMENT, IF ANY OF THE FOLLOWING OCCURS:   Your pain becomes worse, even after taking pain medications.   You develop any other significant symptoms.           1. Return immediately if worse in any way.    2. Follow up with your primary medical doctor for recheck.  Return to the emergency  deparment if unable to follow up as instructed for any reason.    3. If you have any issues with follow up please call the [facility] Case Manager at [phone] between 11am and 5pm Monday-Friday for assistance.       Follow-up with your breast specialist in 1 day for recheck and evaluation

## 2019-03-15 ENCOUNTER — Encounter (INDEPENDENT_AMBULATORY_CARE_PROVIDER_SITE_OTHER): Payer: Self-pay | Admitting: Cardiology

## 2019-03-15 ENCOUNTER — Ambulatory Visit (INDEPENDENT_AMBULATORY_CARE_PROVIDER_SITE_OTHER): Payer: Medicare Other | Admitting: Cardiology

## 2019-03-15 VITALS — BP 135/63 | HR 95 | Resp 18 | Ht 63.0 in | Wt 155.0 lb

## 2019-03-15 DIAGNOSIS — I10 Essential (primary) hypertension: Secondary | ICD-10-CM

## 2019-03-15 DIAGNOSIS — R0789 Other chest pain: Secondary | ICD-10-CM | POA: Insufficient documentation

## 2019-03-15 DIAGNOSIS — R0602 Shortness of breath: Secondary | ICD-10-CM | POA: Insufficient documentation

## 2019-03-15 NOTE — Progress Notes (Signed)
[place] CARDIOLOGY PROGRESS NOTE    I had the pleasure of seeing Emily [name] today for cardiovascular follow up. Sheis apleasant 73 y.o.femalewith a history ofhypertensionand hyperlipidemia with a negative ekg stress test 1/19 and a previous CT chest with calcified granulomas and rt inf lung scarring.  The patient is feeling increased dyspnea on exertion over the past 2 to 3 weeks.  Patient also has noted chest discomfort starting 2 weeks ago in the left chest that can go from seconds to a few minutes.  It occurs almost daily and is not necessarily associated with exertion.    MEDICATIONS:  Current Outpatient Medications   Medication Sig Dispense Refill   . amLODIPine (NORVASC) 5 MG tablet Take 1 tablet (5 mg total) by mouth daily 90 tablet 3   . anastrozole (ARIMIDEX) 1 MG tablet Take 1 tablet (1 mg total) by mouth daily (Patient taking differently: Take 1 mg by mouth every morning    ) 90 tablet 3   . aspirin 81 MG EC tablet Take 81 mg by mouth every morning         . levothyroxine (SYNTHROID, LEVOTHROID) 25 MCG tablet Take 25 mcg by mouth every morning.         . lidocaine (LIDODERM) 5 % Place 1 patch onto the skin every 12 (twelve) hours as needed (pain) Remove & Discard patch within 12 hours or as directed by MD 15 patch 0   . lisinopril (PRINIVIL,ZESTRIL) 20 MG tablet Take 1 tablet (20 mg total) by mouth 2 (two) times daily 180 tablet 3   . Multiple Vitamins-Minerals (MULTIVITAMIN WITH MINERALS) tablet Take 1 tablet by mouth every morning         . rosuvastatin (CRESTOR) 40 MG tablet Take 1 tablet (40 mg total) by mouth daily 30 tablet 0   . simvastatin (ZOCOR) 40 MG tablet TAKE 1 TABLET BY MOUTH ONCE DAILY IN THE MORNING 90 tablet 0   . vitamin B-1 (THIAMINE) 100 MG tablet Take 100 mg by mouth every morning.           REVIEW OF SYSTEMS: All other systems reviewed and negative except as above.    PHYSICAL EXAMINATION  General Appearance: well-appearing and in no acute distress.   Vital Signs: BP 135/63  (BP Site: Left arm, Patient Position: Sitting, Cuff Size: Medium)   Pulse 95   Resp 18   Ht 1.6 m (5\' 3" )   Wt 70.3 kg (155 lb)   SpO2 98%   BMI 27.46 kg/m    HEENT: Sclera anicteric, conjunctiva without pallor, moist mucous membranes.  Neck: Supple without jugular venous distention.   Chest: Clear to auscultation bilaterally with good air movement and respiratory effort and no wheezes, rales, or rhonchi  Cardiovascular: Normal S1 and  S2 without murmurs, gallops or rub. PMI of normal size and nondisplaced.   Extremities: Warm with mild nonpitting edema bilateral  ECG: Sinus rhythm nonspecific ST-T change  Past Medical History:   Diagnosis Date   . Breast cancer 2014    right breast mastectomy   . GERD (gastroesophageal reflux disease)    . Hyperlipidemia    . Hypertension    . Hypothyroidism    . Malignant neoplasm of overlapping sites of right female breast 10/19/2015   . Vertigo      Family History   Problem Relation Age of Onset   . Breast cancer Neg Hx      Social History     Socioeconomic History   .  Marital status: Widowed     Spouse name: None   . Number of children: None   . Years of education: None   . Highest education level: None   Occupational History   . None   Social Needs   . Financial resource strain: None   . Food insecurity     Worry: None     Inability: None   . Transportation needs     Medical: None     Non-medical: None   Tobacco Use   . Smoking status: Never Smoker   . Smokeless tobacco: Never Used   Substance and Sexual Activity   . Alcohol use: Yes     Alcohol/week: 1.0 standard drinks     Types: 1 Cans of beer per week     Comment: socially   . Drug use: No   . Sexual activity: None   Lifestyle   . Physical activity     Days per week: None     Minutes per session: None   . Stress: None   Relationships   . Social Wellsite geologist on phone: None     Gets together: None     Attends religious service: None     Active member of club or organization: None     Attends meetings of clubs  or organizations: None     Relationship status: None   . Intimate partner violence     Fear of current or ex partner: None     Emotionally abused: None     Physically abused: None     Forced sexual activity: None   Other Topics Concern   . None   Social History Narrative   . None         IMPRESSION:  Hypertensioncontrolled  Scarring and granulomas of the lung with rales on exam  Hyperlipidemia  Atypical chest discomfort and dyspnea on exertion    PLAN:   Echocardiogram and stress nuclear scan  Return after the studies      Royann Shivers, MD [facility]  03/15/2019

## 2019-03-18 ENCOUNTER — Encounter (INDEPENDENT_AMBULATORY_CARE_PROVIDER_SITE_OTHER): Payer: Self-pay | Admitting: Cardiology

## 2019-03-19 ENCOUNTER — Other Ambulatory Visit: Payer: Self-pay | Admitting: Family Medicine

## 2019-03-19 ENCOUNTER — Telehealth (INDEPENDENT_AMBULATORY_CARE_PROVIDER_SITE_OTHER): Payer: Medicare Other | Admitting: Cardiology

## 2019-03-19 DIAGNOSIS — Z1239 Encounter for other screening for malignant neoplasm of breast: Secondary | ICD-10-CM

## 2019-03-25 ENCOUNTER — Other Ambulatory Visit: Payer: Self-pay | Admitting: Hematology & Oncology

## 2019-03-25 ENCOUNTER — Ambulatory Visit
Admission: RE | Admit: 2019-03-25 | Discharge: 2019-03-25 | Disposition: A | Discharge status: Home / Self Care | Payer: Medicare Other | Source: Ambulatory Visit | Attending: Hematology & Oncology | Admitting: Hematology & Oncology

## 2019-03-25 ENCOUNTER — Other Ambulatory Visit: Payer: Self-pay | Admitting: Family Medicine

## 2019-03-25 ENCOUNTER — Ambulatory Visit
Admission: RE | Admit: 2019-03-25 | Discharge: 2019-03-25 | Disposition: A | Discharge status: Home / Self Care | Payer: Medicare Other | Source: Ambulatory Visit | Attending: Family Medicine | Admitting: Family Medicine

## 2019-03-25 DIAGNOSIS — Z9011 Acquired absence of right breast and nipple: Secondary | ICD-10-CM | POA: Insufficient documentation

## 2019-03-25 DIAGNOSIS — C50919 Malignant neoplasm of unspecified site of unspecified female breast: Secondary | ICD-10-CM

## 2019-03-25 DIAGNOSIS — N644 Mastodynia: Secondary | ICD-10-CM

## 2019-03-25 DIAGNOSIS — C50911 Malignant neoplasm of unspecified site of right female breast: Secondary | ICD-10-CM | POA: Insufficient documentation

## 2019-03-25 DIAGNOSIS — R599 Enlarged lymph nodes, unspecified: Secondary | ICD-10-CM | POA: Insufficient documentation

## 2019-03-25 DIAGNOSIS — Z1239 Encounter for other screening for malignant neoplasm of breast: Secondary | ICD-10-CM

## 2019-03-27 ENCOUNTER — Other Ambulatory Visit (INDEPENDENT_AMBULATORY_CARE_PROVIDER_SITE_OTHER): Payer: Medicare Other

## 2019-04-15 ENCOUNTER — Emergency Department: Payer: No Typology Code available for payment source

## 2019-04-15 ENCOUNTER — Inpatient Hospital Stay: Payer: No Typology Code available for payment source

## 2019-04-15 ENCOUNTER — Observation Stay
Admission: EM | Admit: 2019-04-15 | Discharge: 2019-04-17 | Disposition: A | Discharge status: Home / Self Care | Payer: No Typology Code available for payment source | Attending: Internal Medicine | Admitting: Internal Medicine

## 2019-04-15 DIAGNOSIS — R0789 Other chest pain: Secondary | ICD-10-CM | POA: Diagnosis present

## 2019-04-15 DIAGNOSIS — Z853 Personal history of malignant neoplasm of breast: Secondary | ICD-10-CM | POA: Insufficient documentation

## 2019-04-15 DIAGNOSIS — Z03818 Encounter for observation for suspected exposure to other biological agents ruled out: Secondary | ICD-10-CM | POA: Insufficient documentation

## 2019-04-15 DIAGNOSIS — E039 Hypothyroidism, unspecified: Secondary | ICD-10-CM | POA: Diagnosis present

## 2019-04-15 DIAGNOSIS — K219 Gastro-esophageal reflux disease without esophagitis: Secondary | ICD-10-CM | POA: Insufficient documentation

## 2019-04-15 DIAGNOSIS — R1013 Epigastric pain: Secondary | ICD-10-CM

## 2019-04-15 DIAGNOSIS — M25512 Pain in left shoulder: Secondary | ICD-10-CM | POA: Insufficient documentation

## 2019-04-15 DIAGNOSIS — R202 Paresthesia of skin: Secondary | ICD-10-CM | POA: Insufficient documentation

## 2019-04-15 DIAGNOSIS — R079 Chest pain, unspecified: Principal | ICD-10-CM | POA: Insufficient documentation

## 2019-04-15 DIAGNOSIS — R200 Anesthesia of skin: Secondary | ICD-10-CM | POA: Insufficient documentation

## 2019-04-15 DIAGNOSIS — R0602 Shortness of breath: Secondary | ICD-10-CM | POA: Diagnosis present

## 2019-04-15 DIAGNOSIS — I10 Essential (primary) hypertension: Secondary | ICD-10-CM

## 2019-04-15 DIAGNOSIS — Z7982 Long term (current) use of aspirin: Secondary | ICD-10-CM | POA: Insufficient documentation

## 2019-04-15 DIAGNOSIS — E785 Hyperlipidemia, unspecified: Secondary | ICD-10-CM

## 2019-04-15 DIAGNOSIS — C50811 Malignant neoplasm of overlapping sites of right female breast: Secondary | ICD-10-CM | POA: Diagnosis present

## 2019-04-15 DIAGNOSIS — E782 Mixed hyperlipidemia: Secondary | ICD-10-CM

## 2019-04-15 DIAGNOSIS — Z9011 Acquired absence of right breast and nipple: Secondary | ICD-10-CM | POA: Insufficient documentation

## 2019-04-15 DIAGNOSIS — R531 Weakness: Secondary | ICD-10-CM | POA: Insufficient documentation

## 2019-04-15 LAB — COMPREHENSIVE METABOLIC PANEL
ALT: 12 U/L (ref 0–55)
AST (SGOT): 20 U/L (ref 5–34)
Albumin/Globulin Ratio: 1.3 (ref 0.9–2.2)
Albumin: 4 g/dL (ref 3.5–5.0)
Alkaline Phosphatase: 100 U/L (ref 37–106)
Anion Gap: 13 (ref 5.0–15.0)
BUN: 12 mg/dL (ref 7–19)
Bilirubin, Total: 0.3 mg/dL (ref 0.2–1.2)
CO2: 30 mEq/L — ABNORMAL HIGH (ref 22–29)
Calcium: 9.4 mg/dL (ref 7.9–10.2)
Chloride: 101 mEq/L (ref 100–111)
Creatinine: 1 mg/dL (ref 0.6–1.0)
Globulin: 3.2 g/dL (ref 2.0–3.6)
Glucose: 111 mg/dL — ABNORMAL HIGH (ref 70–100)
Potassium: 3.9 mEq/L (ref 3.5–5.1)
Protein, Total: 7.2 g/dL (ref 6.0–8.3)
Sodium: 144 mEq/L (ref 136–145)

## 2019-04-15 LAB — SEDIMENTATION RATE: Sed Rate: 17 mm/Hr (ref 0–20)

## 2019-04-15 LAB — CBC AND DIFFERENTIAL
Absolute NRBC: 0 10*3/uL (ref 0.00–0.00)
Basophils Absolute Automated: 0.04 10*3/uL (ref 0.00–0.08)
Basophils Automated: 0.5 %
Eosinophils Absolute Automated: 0.08 10*3/uL (ref 0.00–0.44)
Eosinophils Automated: 1.1 %
Hematocrit: 38.9 % (ref 34.7–43.7)
Hgb: 12.4 g/dL (ref 11.4–14.8)
Immature Granulocytes Absolute: 0.02 10*3/uL (ref 0.00–0.07)
Immature Granulocytes: 0.3 %
Lymphocytes Absolute Automated: 1.77 10*3/uL (ref 0.42–3.22)
Lymphocytes Automated: 23.8 %
MCH: 26.3 pg (ref 25.1–33.5)
MCHC: 31.9 g/dL (ref 31.5–35.8)
MCV: 82.6 fL (ref 78.0–96.0)
MPV: 11.3 fL (ref 8.9–12.5)
Monocytes Absolute Automated: 0.71 10*3/uL (ref 0.21–0.85)
Monocytes: 9.5 %
Neutrophils Absolute: 4.83 10*3/uL (ref 1.10–6.33)
Neutrophils: 64.8 %
Nucleated RBC: 0 /100 WBC (ref 0.0–0.0)
Platelets: 353 10*3/uL — ABNORMAL HIGH (ref 142–346)
RBC: 4.71 10*6/uL (ref 3.90–5.10)
RDW: 14 % (ref 11–15)
WBC: 7.45 10*3/uL (ref 3.10–9.50)

## 2019-04-15 LAB — GFR: EGFR: >60

## 2019-04-15 LAB — HEMOGLOBIN A1C
Average Estimated Glucose: 111.2 mg/dL
Hemoglobin A1C: 5.5 % (ref 4.6–5.9)

## 2019-04-15 LAB — TROPONIN I
Troponin I: <0.01 ng/mL (ref 0.00–0.05)
Troponin I: <0.01 ng/mL (ref 0.00–0.05)

## 2019-04-15 LAB — GLUCOSE WHOLE BLOOD - POCT: Whole Blood Glucose POCT: 102 mg/dL — ABNORMAL HIGH (ref 70–100)

## 2019-04-15 LAB — PT/INR
PT INR: 0.9 (ref 0.9–1.1)
PT: 12.5 s — ABNORMAL LOW (ref 12.6–15.0)

## 2019-04-15 LAB — APTT: PTT: 33 s (ref 23–37)

## 2019-04-15 LAB — IHS D-DIMER: D-Dimer: 0.41 ug/mL FEU (ref 0.00–0.70)

## 2019-04-15 LAB — C-REACTIVE PROTEIN: C-Reactive Protein: 1 mg/dL — ABNORMAL HIGH (ref 0.0–0.5)

## 2019-04-15 LAB — PROCALCITONIN: Procalcitonin: 0.02 (ref 0.00–0.10)

## 2019-04-15 MED ORDER — HYDRALAZINE HCL 20 MG/ML IJ SOLN
10.00 mg | Freq: Four times a day (QID) | INTRAMUSCULAR | Status: DC | PRN
Start: 2019-04-15 — End: 2019-04-17

## 2019-04-15 MED ORDER — ANASTROZOLE 1 MG PO TABS
1.0000 mg | ORAL_TABLET | Freq: Every day | ORAL | Status: DC
  Administered 2019-04-16 – 2019-04-17 (×2): 1 mg via ORAL
  Filled 2019-04-15 (×2): qty 1

## 2019-04-15 MED ORDER — ASPIRIN 325 MG PO TABS
325.00 mg | ORAL_TABLET | Freq: Every day | ORAL | Status: AC
Start: 2019-04-15 — End: 2019-04-15
  Administered 2019-04-15: 325 mg via ORAL
  Filled 2019-04-15: qty 1

## 2019-04-15 MED ORDER — ASPIRIN EC 81 MG PO TBEC
81.00 mg | DELAYED_RELEASE_TABLET | Freq: Every morning | ORAL | Status: DC
Start: 2019-04-16 — End: 2019-04-17
  Administered 2019-04-16 – 2019-04-17 (×2): 81 mg via ORAL
  Filled 2019-04-15 (×2): qty 1

## 2019-04-15 MED ORDER — ROSUVASTATIN CALCIUM 10 MG PO TABS
40.0000 mg | ORAL_TABLET | Freq: Every day | ORAL | Status: DC
Start: 2019-04-16 — End: 2019-04-17
  Administered 2019-04-16 – 2019-04-17 (×2): 40 mg via ORAL
  Filled 2019-04-15 (×2): qty 4

## 2019-04-15 MED ORDER — LEVOTHYROXINE SODIUM 25 MCG PO TABS
25.0000 ug | ORAL_TABLET | Freq: Every morning | ORAL | Status: DC
Start: 2019-04-16 — End: 2019-04-17
  Administered 2019-04-16 – 2019-04-17 (×2): 25 ug via ORAL
  Filled 2019-04-15 (×2): qty 1

## 2019-04-15 MED ORDER — LISINOPRIL 20 MG PO TABS
20.0000 mg | ORAL_TABLET | Freq: Two times a day (BID) | ORAL | Status: DC
Start: 2019-04-15 — End: 2019-04-17
  Administered 2019-04-16 – 2019-04-17 (×3): 20 mg via ORAL
  Filled 2019-04-15 (×3): qty 1

## 2019-04-15 MED ORDER — ONDANSETRON HCL 4 MG/2ML IJ SOLN
4.00 mg | Freq: Four times a day (QID) | INTRAMUSCULAR | Status: DC | PRN
Start: 2019-04-15 — End: 2019-04-17

## 2019-04-15 MED ORDER — IOHEXOL 350 MG/ML IV SOLN
100.00 mL | Freq: Once | INTRAVENOUS | Status: AC | PRN
Start: 2019-04-15 — End: 2019-04-15
  Administered 2019-04-15: 100 mL via INTRAVENOUS

## 2019-04-15 MED ORDER — VITAMINS/MINERALS PO TABS
1.0000 | ORAL_TABLET | Freq: Every morning | ORAL | Status: DC
Start: 2019-04-16 — End: 2019-04-17
  Administered 2019-04-16 – 2019-04-17 (×2): 1 via ORAL
  Filled 2019-04-15 (×2): qty 1

## 2019-04-15 MED ORDER — ACETAMINOPHEN 325 MG PO TABS
650.0000 mg | ORAL_TABLET | Freq: Four times a day (QID) | ORAL | Status: DC | PRN
Start: 2019-04-15 — End: 2019-04-17
  Administered 2019-04-15: 650 mg via ORAL
  Filled 2019-04-15: qty 2

## 2019-04-15 MED ORDER — MORPHINE SULFATE 2 MG/ML IJ/IV SOLN (WRAP)
2.0000 mg | Status: DC | PRN
Start: 2019-04-15 — End: 2019-04-17

## 2019-04-15 MED ORDER — THIAMINE (VITAMIN B1) 100 MG PO TABS (WRAP)
100.0000 mg | ORAL_TABLET | Freq: Every morning | ORAL | Status: DC
Start: 2019-04-16 — End: 2019-04-17
  Administered 2019-04-16 – 2019-04-17 (×2): 100 mg via ORAL
  Filled 2019-04-15 (×2): qty 1

## 2019-04-15 MED ORDER — AMLODIPINE BESYLATE 5 MG PO TABS
5.0000 mg | ORAL_TABLET | Freq: Every day | ORAL | Status: DC
Start: 2019-04-16 — End: 2019-04-17
  Administered 2019-04-16 – 2019-04-17 (×2): 5 mg via ORAL
  Filled 2019-04-15 (×2): qty 1

## 2019-04-15 NOTE — ED Notes (Signed)
Bed: E07  Expected date: 04/14/19  Expected time:   Means of arrival:   Comments:

## 2019-04-15 NOTE — Nursing Progress Note (Signed)
Per MD, will wait for neuro re: MRI.  Informed patient, she was hesitant at first as MRI was done last feb, now she is agreeable for MRI.

## 2019-04-15 NOTE — Consults (Signed)
Consults   CONSULTATION    Date Time: 04/15/19 3:29 PM  Patient Name: Emily [name]  Requesting Physician: Marland Mcalpine*      Reason for Consultation:   Chest discomfort    Assessment:   -Left-sided numbness and tingling  -Atypical chest discomfort  -History of hypertension  -History of hyperlipidemia    Plan:   -Lexiscan nuclear stress  -Echocardiogram    History:   BRICE POTTEIGER is a 73 y.o. female who presents to the hospital on 04/15/2019 with a history of tingling in left arm and left leg with numbness in the left foot off and on since yesterday.  No weakness he is described.  The patient is also had discomfort in the left chest that has come and gone not associated with physical activity.  No syncope near syncope or shortness of breath as part of this hospital stay.  The patient has had history of dyspnea on exertion previously and was going to undergo cardiac evaluation for previously noted chest discomfort and shortness of breath in June 2020.  However the studies have not yet been completed.  There is a history of hypertension and hyperlipidemia.    Past Medical History:     Past Medical History:   Diagnosis Date   . Breast cancer 2014    right breast mastectomy   . GERD (gastroesophageal reflux disease)    . Hyperlipidemia    . Hypertension    . Hypothyroidism    . Malignant neoplasm of overlapping sites of right female breast 10/19/2015   . Vertigo        Past Surgical History:     Past Surgical History:   Procedure Laterality Date   . HYSTERECTOMY     . MASTECTOMY Right 2015       Family History:     Family History   Problem Relation Age of Onset   . Breast cancer Neg Hx        Social History:     Social History     Socioeconomic History   . Marital status: Widowed     Spouse name: Not on file   . Number of children: Not on file   . Years of education: Not on file   . Highest education level: Not on file   Occupational History   . Not on file   Social Needs   . Financial resource strain: Not on  file   . Food insecurity     Worry: Not on file     Inability: Not on file   . Transportation needs     Medical: Not on file     Non-medical: Not on file   Tobacco Use   . Smoking status: Never Smoker   . Smokeless tobacco: Never Used   Substance and Sexual Activity   . Alcohol use: Yes     Alcohol/week: 1.0 standard drinks     Types: 1 Cans of beer per week     Comment: socially   . Drug use: No   . Sexual activity: Not on file   Lifestyle   . Physical activity     Days per week: Not on file     Minutes per session: Not on file   . Stress: Not on file   Relationships   . Social Wellsite geologist on phone: Not on file     Gets together: Not on file     Attends religious service: Not  on file     Active member of club or organization: Not on file     Attends meetings of clubs or organizations: Not on file     Relationship status: Not on file   . Intimate partner violence     Fear of current or ex partner: Not on file     Emotionally abused: Not on file     Physically abused: Not on file     Forced sexual activity: Not on file   Other Topics Concern   . Not on file   Social History Narrative   . Not on file       Allergies:     Allergies   Allergen Reactions   . Caffeine      Personal preference   . Morphine Itching   . Morphine And Related Itching and Nausea And Vomiting     dizzy       Medications:       Review of Systems:   A comprehensive review of systems was: History obtained from chart review and the patient  General ROS: negative  Psychological ROS: negative  Endocrine ROS: negative  Respiratory ROS: no cough, shortness of breath, or wheezing  Cardiovascular ROS: positive for - chest pain  Gastrointestinal ROS: no abdominal pain, change in bowel habits, or black or bloody stools  Genito-Urinary ROS: no dysuria, trouble voiding, or hematuria  Musculoskeletal ROS: negative  Neurological ROS: positive for - numbness/tingling  Dermatological ROS: negative    Physical Exam:     Vitals:    04/15/19 1514   BP:     Pulse: 78   Resp: 14   Temp:    SpO2: 95%       Intake and Output Summary (Last 24 hours) at Date Time  No intake or output data in the 24 hours ending 04/15/19 1529    General appearance - alert, well appearing, and in no distress  Mental status - alert, oriented to person, place, and time  Mouth - mucous membranes moist, pharynx normal without lesions  Chest - clear to auscultation, no wheezes, rales or rhonchi, symmetric air entry  Heart - normal rate, regular rhythm, normal S1, S2, no murmurs, rubs, clicks or gallops  Abdomen - tenderness noted epigastrium  Neurological - alert, oriented, normal speech, no focal findings or movement disorder noted  Musculoskeletal - no joint tenderness, deformity or swelling  Extremities - no pedal edema noted  Skin - normal coloration and turgor, no rashes, no suspicious skin lesions noted    Labs Reviewed:     Results     Procedure Component Value Units Date/Time    Troponin I [[id number]] Collected:  04/15/19 1308    Specimen:  Blood Updated:  04/15/19 1344     Troponin I <0.01 ng/mL     APTT [[id number]] Collected:  04/15/19 1308     Updated:  04/15/19 1338     PTT 33 sec     Comprehensive metabolic panel [[id number]]  (Abnormal) Collected:  04/15/19 1308    Specimen:  Blood Updated:  04/15/19 1338     Glucose 111 mg/dL      BUN 12 mg/dL      Creatinine 1.0 mg/dL      Sodium 086 mEq/L      Potassium 3.9 mEq/L      Chloride 101 mEq/L      CO2 30 mEq/L      Calcium 9.4 mg/dL      Protein, Total  7.2 g/dL      Albumin 4.0 g/dL      AST (SGOT) 20 U/L      ALT 12 U/L      Alkaline Phosphatase 100 U/L      Bilirubin, Total 0.3 mg/dL      Globulin 3.2 g/dL      Albumin/Globulin Ratio 1.3     Anion Gap 13.0    GFR [[id number]] Collected:  04/15/19 1308     Updated:  04/15/19 1338     EGFR >60.0    Prothrombin time/INR [[id number]]  (Abnormal) Collected:  04/15/19 1308    Specimen:  Blood Updated:  04/15/19 1337     PT 12.5 sec      PT INR 0.9    CBC and differential [[id number]]   (Abnormal) Collected:  04/15/19 1308    Specimen:  Blood Updated:  04/15/19 1321     WBC 7.45 x10 3/uL      Hgb 12.4 g/dL      Hematocrit 29.5 %      Platelets 353 x10 3/uL      RBC 4.71 x10 6/uL      MCV 82.6 fL      MCH 26.3 pg      MCHC 31.9 g/dL      RDW 14 %      MPV 11.3 fL      Neutrophils 64.8 %      Lymphocytes Automated 23.8 %      Monocytes 9.5 %      Eosinophils Automated 1.1 %      Basophils Automated 0.5 %      Immature Granulocytes 0.3 %      Nucleated RBC 0.0 /100 WBC      Neutrophils Absolute 4.83 x10 3/uL      Lymphocytes Absolute Automated 1.77 x10 3/uL      Monocytes Absolute Automated 0.71 x10 3/uL      Eosinophils Absolute Automated 0.08 x10 3/uL      Basophils Absolute Automated 0.04 x10 3/uL      Immature Granulocytes Absolute 0.02 x10 3/uL      Absolute NRBC 0.00 x10 3/uL     Glucose Whole Blood - POCT [[id number]]  (Abnormal) Collected:  04/15/19 1307     Updated:  04/15/19 1312     Whole Blood Glucose POCT 102 mg/dL           Recent CBC   Recent Labs     04/15/19  1308   RBC 4.71   Hgb 12.4   Hematocrit 38.9   MCV 82.6   MCH 26.3   MCHC 31.9   RDW 14   MPV 11.3       Rads:   Radiological Procedure reviewed.     Signed by: Royann Shivers

## 2019-04-15 NOTE — Progress Notes (Signed)
Pt arrived on the unit from the ED for covid-19 r/o, stroke r/o, and chest pain. Pt skin is intact with right breast mastectomy. VSS are stable. Pt pass dysphagia screening, sandwich given to pt. Will update oncoming nurse.

## 2019-04-15 NOTE — EDIE (Signed)
COLLECTIVE?NOTIFICATION?04/15/2019 12:40?Emily [name], Emily [name]?MRN: [id number]    [facility]'s patient encounter information:   VQQ:[id number]  Account [id number]  Billing Account [id number]      Criteria Met      5 ED Visits in 12 Months    Security and Safety  No recent Security Events currently on file    ED Care Guidelines  There are currently no ED Care Guidelines for this patient. Please check your facility's medical records system.        Prescription Monitoring Program  PDMP query found no report.  Narx Score not available at this time.      E.D. Visit Count (12 mo.)  Facility Visits   [place] [facility] 7   Total 7   Note: Visits indicate total known visits.      Recent Emergency Department Visit Summary  Date Facility [facility] Type Diagnoses or Chief Complaint   Apr 15, 2019 [place] [facility] [place] [facility]      Chest Pain      Feb 22, 2019 [place] [facility] [place] [facility]      post op complications, rt breast      Breast Pain      Scar conditions and fibrosis of skin      Pain, unspecified      Jan 11, 2019 [place] [facility] [place] [facility]      triage- cough      triage- cough, fever      Fever      Covid-19 Screening      Other general symptoms and signs      Cough      Generalized abdominal pain      Jan 01, 2019 [place] [facility] [place] [facility]      legs swelling, rapid heart rate      Chest Pain      Leg Swelling      Localized edema      Palpitations      Nov 13, 2018 [place] [facility] [place] [facility]      CHEST PAIN, ARM TINGLING      Chest Pain      Anesthesia of skin      Jun 20, 2018 [place] [facility] [place] [facility]      fell at home, injuring head, face, right knee, feels lightheaded      Fall      Other chest pain      Fall (on)(from) sidewalk curb, initial encounter      Pain in right knee      Pain in right shoulder      Other injury of unspecified body region, initial encounter      May 14, 2018  [facility] [place] [facility]      dizziness, sycope      Extremity Weakness      Dizziness      Weakness          Recent Inpatient Visit Summary  No recorded inpatient visits.     Care Team  Provider Specialty Phone Fax Service Dates   DAVIDSON, Janet Berlin MD M, MD Family Medicine: Adult Medicine [phone] Current      Collective Portal  This patient has registered at the [facility] Emergency Department   For more information visit: [email or web address]  PLEASE NOTE:     1.   Any care recommendations and other clinical information are provided as guidelines or for historical purposes only, and providers should exercise their own clinical judgment when providing care.    2.   You may only use this information for purposes of treatment, payment or health care operations activities, and subject to the limitations of applicable [employer] Policies.    3.   You should consult directly with the organization that provided a care guideline or other clinical history with any questions about additional information or accuracy or completeness of information provided.    ? 2020 [employer] - [email or web address].com/

## 2019-04-15 NOTE — ED Notes (Signed)
Pt placed back on monitor and updated on plan of care. Agreeable to plan. Warm blanket provided. No requests at this time. Respirations even and unlabored.

## 2019-04-15 NOTE — Nursing Progress Note (Signed)
1930: Came in to the room assessment done, NIHSS -0. Passed dysphagia test. All limbs 5/5 patient stated weakness on the right upper and lower extremities. No dysarthria, no aphasia. For MRI and stress test, instructed on NPO after midnight.   Pt alert and oriented x4. Pt oriented to room. Call bell within reached. Bed alarm on. Instructed to call when in need to go to bathroom. All belongings entered in epic. All questions and concern addressed. Will continue to monitor pt for any changes.  Will start aspirin and do education.  2030: Stroke education done, brochure given.

## 2019-04-15 NOTE — ED Provider Notes (Signed)
EMERGENCY DEPARTMENT NOTE    Physician/Midlevel provider first contact with patient: 04/15/19 1251         HISTORY OF PRESENT ILLNESS   Historian: patient  Translator Used: no    73 y.o. female presents with chest pain and left sided numbness/weakness.    1. Location of symptoms: chest pain, left sided numbness/weakness  2. Onset of symptoms: 00:00 today  3. What was patient doing when symptoms started (Context): went to bed with symptoms  4. Severity: severe  5. Timing: acute onset, constant  6. Activities that worsen symptoms: none  7. Activities that improve symptoms: none  8. Quality: chest pain- sharp pain under breast, left arm and leg tingling and weakness (feels heavy)  9. Radiation of symptoms: none  10. Associated signs and Symptoms: none   11. Are symptoms worsening? yes    MEDICAL HISTORY     Past Medical History:  Past Medical History:   Diagnosis Date   . Breast cancer 2014    right breast mastectomy   . GERD (gastroesophageal reflux disease)    . Hyperlipidemia    . Hypertension    . Hypothyroidism    . Malignant neoplasm of overlapping sites of right female breast 10/19/2015   . Vertigo        Past Surgical History:  Past Surgical History:   Procedure Laterality Date   . HYSTERECTOMY     . MASTECTOMY Right 2015       Social History:  Social History     Socioeconomic History   . Marital status: Widowed     Spouse name: Not on file   . Number of children: Not on file   . Years of education: Not on file   . Highest education level: Not on file   Occupational History   . Not on file   Social Needs   . Financial resource strain: Not on file   . Food insecurity     Worry: Not on file     Inability: Not on file   . Transportation needs     Medical: Not on file     Non-medical: Not on file   Tobacco Use   . Smoking status: Never Smoker   . Smokeless tobacco: Never Used   Substance and Sexual Activity   . Alcohol use: Yes     Alcohol/week: 1.0 standard drinks     Types: 1 Cans of beer per week     Comment:  socially   . Drug use: No   . Sexual activity: Not on file   Lifestyle   . Physical activity     Days per week: Not on file     Minutes per session: Not on file   . Stress: Not on file   Relationships   . Social Wellsite geologist on phone: Not on file     Gets together: Not on file     Attends religious service: Not on file     Active member of club or organization: Not on file     Attends meetings of clubs or organizations: Not on file     Relationship status: Not on file   . Intimate partner violence     Fear of current or ex partner: Not on file     Emotionally abused: Not on file     Physically abused: Not on file     Forced sexual activity: Not on file   Other Topics Concern   .  Not on file   Social History Narrative   . Not on file       Family History:  Family History   Problem Relation Age of Onset   . Breast cancer Neg Hx        Outpatient Medication:  Previous Medications    AMLODIPINE (NORVASC) 5 MG TABLET    Take 1 tablet (5 mg total) by mouth daily    ANASTROZOLE (ARIMIDEX) 1 MG TABLET    Take 1 tablet (1 mg total) by mouth daily    ASPIRIN 81 MG EC TABLET    Take 81 mg by mouth every morning        LEVOTHYROXINE (SYNTHROID, LEVOTHROID) 25 MCG TABLET    Take 25 mcg by mouth every morning.        LIDOCAINE (LIDODERM) 5 %    Place 1 patch onto the skin every 12 (twelve) hours as needed (pain) Remove & Discard patch within 12 hours or as directed by MD    LISINOPRIL (PRINIVIL,ZESTRIL) 20 MG TABLET    Take 1 tablet (20 mg total) by mouth 2 (two) times daily    MULTIPLE VITAMINS-MINERALS (MULTIVITAMIN WITH MINERALS) TABLET    Take 1 tablet by mouth every morning        ROSUVASTATIN (CRESTOR) 40 MG TABLET    Take 1 tablet (40 mg total) by mouth daily    SIMVASTATIN (ZOCOR) 40 MG TABLET    TAKE 1 TABLET BY MOUTH ONCE DAILY IN THE MORNING    VITAMIN B-1 (THIAMINE) 100 MG TABLET    Take 100 mg by mouth every morning.       Allergies:  Allergies   Allergen Reactions   . Caffeine      Personal preference   .  Morphine Itching   . Morphine And Related Itching and Nausea And Vomiting     dizzy       REVIEW OF SYSTEMS   Review of Systems   Cardiovascular: Positive for chest pain.   Neurological: Positive for tingling, sensory change and focal weakness.   All other systems reviewed and are negative.    PHYSICAL EXAM     Vitals:    04/15/19 1250   BP: 178/83   Pulse: 77   Resp: 17   SpO2: 98%       Nursing note and vitals reviewed.    Constitutional: non-toxic  Head: Atraumatic.  Eyes: PERRL. EOMI. No scleral icterus.  ENT: Mucous membranes are moist and intact. Oropharynx is clear. Patent airway.  Neck: Supple. No cervical lymphadenopathy.  Cardiovascular: Regular rate. Regular rhythm. No murmurs, rubs, or gallops.  Pulmonary/Chest: No evidence of respiratory distress. Clear to auscultation bilaterally. No wheezing, rales or rhonchi.   GI: Soft, non-distended abdomen. No tenderness to palpation of abdomen.  Extremities: No edema. No deformity.  Skin: No rash.   Neurological: Awake, alert and oriented x 3. CN II-XII intact. Strength intact. Sensation diminished left arm and leg. Normal finger to nose and heel to shin.  Psychiatric: Appropriate affect. Appropriate mood. Appropriate behavior.    MEDICAL DECISION MAKING   Chest pain, left sided arm and leg tingling. Started today at midnight. I don't appreciate any speech problems or weakness. Subjective tingling/numbness on left upper and lower ext noted. Ct head obtained to eval for stroke. This is normal. Ekg, trop obtained to evaluate for acs. ekg read below. Trop negative. Aspirin given. Admitting to Dr. Higinio Plan. Dr. Francesco Sor has been consulted regarding neurological symptoms.  Admitted to Dr. Higinio Plan. Consulted Dr. Franchot Erichsen regarding chest pain.    DISCUSSION      Vital Signs: Reviewed the patient?s vital signs.   Nursing Notes: Reviewed and utilized available nursing notes.  Medical Records Reviewed: Reviewed available past medical records.  Counseling: The emergency  provider has spoken with the patient and discussed today?s findings, in addition to providing specific details for the plan of care.  Questions are answered and there is agreement with the plan.    IMAGING STUDIES    The following imaging studies were reviewed by the Emergency Medicine Physician.  For full imaging study results please see chart.    CT Head WO Contrast   Final Result    No acute intracranial process.      Sandie Ano, MD    04/15/2019 2:33 PM      Chest AP Portable   Final Result    Mild increase in the degree of airspace disease   in right mid lung zone may represent atelectasis or pneumonitis      Laurena Slimmer, MD    04/15/2019 1:16 PM      CT Angiogram Head Neck    (Results Pending)       CARDIAC STUDIES     The following cardiac studies were independently interpreted by the Emergency Medicine Physician. For full cardiac study results please see chart     EKG Interpretation:   Signed and interpreted by ED Physician   Time Interpreted: 1249  Comparison:   Rate: 72  Rhythm: nsr   Axis:    Intervals: normal  Blocks: none  ST segments: q waves III and avf unchanged compared to prior ekg 01/01/19  Interpretation: abnormal EKG    PULSE OXIMETRY    Oxygen Saturation by Pulse Oximetry: 98% RA  Interventions: none  Interpretation: normal    EMERGENCY DEPT. MEDICATIONS      ED Medication Orders (From admission, onward)    None          LABORATORY RESULTS    Ordered and independently interpreted AVAILABLE laboratory tests. Please see results section in chart for full details.  Results for orders placed or performed during the hospital encounter of 04/15/19   Comprehensive metabolic panel   Result Value Ref Range    Glucose 111 (H) 70 - 100 mg/dL    BUN 12 7 - 19 mg/dL    Creatinine 1.0 0.6 - 1.0 mg/dL    Sodium 161 096 - 045 mEq/L    Potassium 3.9 3.5 - 5.1 mEq/L    Chloride 101 100 - 111 mEq/L    CO2 30 (H) 22 - 29 mEq/L    Calcium 9.4 7.9 - 10.2 mg/dL    Protein, Total 7.2 6.0 - 8.3 g/dL    Albumin 4.0 3.5 -  5.0 g/dL    AST (SGOT) 20 5 - 34 U/L    ALT 12 0 - 55 U/L    Alkaline Phosphatase 100 37 - 106 U/L    Bilirubin, Total 0.3 0.2 - 1.2 mg/dL    Globulin 3.2 2.0 - 3.6 g/dL    Albumin/Globulin Ratio 1.3 0.9 - 2.2    Anion Gap 13.0 5.0 - 15.0   Troponin I   Result Value Ref Range    Troponin I <0.01 0.00 - 0.05 ng/mL   CBC and differential   Result Value Ref Range    WBC 7.45 3.10 - 9.50 x10 3/uL    Hgb 12.4 11.4 - 14.8 g/dL  Hematocrit 38.9 34.7 - 43.7 %    Platelets 353 (H) 142 - 346 x10 3/uL    RBC 4.71 3.90 - 5.10 x10 6/uL    MCV 82.6 78.0 - 96.0 fL    MCH 26.3 25.1 - 33.5 pg    MCHC 31.9 31.5 - 35.8 g/dL    RDW 14 11 - 15 %    MPV 11.3 8.9 - 12.5 fL    Neutrophils 64.8 None %    Lymphocytes Automated 23.8 None %    Monocytes 9.5 None %    Eosinophils Automated 1.1 None %    Basophils Automated 0.5 None %    Immature Granulocytes 0.3 None %    Nucleated RBC 0.0 0.0 - 0.0 /100 WBC    Neutrophils Absolute 4.83 1.10 - 6.33 x10 3/uL    Lymphocytes Absolute Automated 1.77 0.42 - 3.22 x10 3/uL    Monocytes Absolute Automated 0.71 0.21 - 0.85 x10 3/uL    Eosinophils Absolute Automated 0.08 0.00 - 0.44 x10 3/uL    Basophils Absolute Automated 0.04 0.00 - 0.08 x10 3/uL    Immature Granulocytes Absolute 0.02 0.00 - 0.07 x10 3/uL    Absolute NRBC 0.00 0.00 - 0.00 x10 3/uL   GFR   Result Value Ref Range    EGFR >60.0    Prothrombin time/INR   Result Value Ref Range    PT 12.5 (L) 12.6 - 15.0 sec    PT INR 0.9 0.9 - 1.1   APTT   Result Value Ref Range    PTT 33 23 - 37 sec   Glucose Whole Blood - POCT   Result Value Ref Range    Whole Blood Glucose POCT 102 (H) 70 - 100 mg/dL   ECG 12 Lead   Result Value Ref Range    Ventricular Rate 72 BPM    Atrial Rate 72 BPM    P-R Interval 164 ms    QRS Duration 94 ms    Q-T Interval 376 ms    QTC Calculation (Bezet) 411 ms    P Axis 45 degrees    R Axis 12 degrees    T Axis -58 degrees       ATTESTATIONS      Physician Attestation: Darlyn Read MD, have been the primary provider  for [name] during this Emergency Dept visit and have reviewed the chart for accuracy and agree with its content.     DIAGNOSIS      Diagnosis:  Final diagnoses:   Left sided numbness       Disposition:  ED Disposition     ED Disposition Condition Date/Time Comment    Admit  Mon Apr 15, 2019  2:45 PM Admitting Physician: Mosie Epstein [[id number]   Diagnosis: Left sided numbness [161096]   Estimated Length of Stay: > or = to 2 midnights   Tentative Discharge Plan?: Home or Self Care [1]   Patient Class: Inpatient [101]            Prescriptions:  Patient's Medications   New Prescriptions    No medications on file   Previous Medications    AMLODIPINE (NORVASC) 5 MG TABLET    Take 1 tablet (5 mg total) by mouth daily    ANASTROZOLE (ARIMIDEX) 1 MG TABLET    Take 1 tablet (1 mg total) by mouth daily    ASPIRIN 81 MG EC TABLET    Take 81 mg by mouth every morning  LEVOTHYROXINE (SYNTHROID, LEVOTHROID) 25 MCG TABLET    Take 25 mcg by mouth every morning.        LIDOCAINE (LIDODERM) 5 %    Place 1 patch onto the skin every 12 (twelve) hours as needed (pain) Remove & Discard patch within 12 hours or as directed by MD    LISINOPRIL (PRINIVIL,ZESTRIL) 20 MG TABLET    Take 1 tablet (20 mg total) by mouth 2 (two) times daily    MULTIPLE VITAMINS-MINERALS (MULTIVITAMIN WITH MINERALS) TABLET    Take 1 tablet by mouth every morning        ROSUVASTATIN (CRESTOR) 40 MG TABLET    Take 1 tablet (40 mg total) by mouth daily    SIMVASTATIN (ZOCOR) 40 MG TABLET    TAKE 1 TABLET BY MOUTH ONCE DAILY IN THE MORNING    VITAMIN B-1 (THIAMINE) 100 MG TABLET    Take 100 mg by mouth every morning.   Modified Medications    No medications on file   Discontinued Medications    No medications on file          Marland Mcalpine, MD  04/15/19 1511

## 2019-04-15 NOTE — H&P (Signed)
ADMISSION HISTORY AND PHYSICAL EXAM    Date Time: 04/15/19 3:08 PM  Patient Name: Emily [name]  Attending Physician: Marland Mcalpine*  Primary Care Physician: Skeet Simmer, MD      CC:   Chest discomfort   Left Sided facial numbness and Tingling  Worsening Generalized weakness      History of Presenting Illness:   Emily [name] is a 73 y.o. female who presents to the hospital with above-stated complaints of worsening generalized weakness, left facial numbness and tingling. Patient claimed symptoms had been ongoing for the past few days with associated chest discomfort leading to the ER visit. Patient evaluated to be weak but no focal neurological deficit, a state head CT negative for acute intracranial pathology. Chest X-ray noted for mild increase in the degree of airspace disease in the right mid lung zone possibly due to atelectasis verse pneumonitis. Biochemical profile noted to be normal. PUI protocol was initiated due to prevalence of COVID-19 infection with presumed close exposure patient will be admitted on the stroke pathway under PUI protocol and further evaluation by Neurology and Cardiology.      Past Medical History:     Past Medical History:   Diagnosis Date   . Breast cancer 2014    right breast mastectomy   . GERD (gastroesophageal reflux disease)    . Hyperlipidemia    . Hypertension    . Hypothyroidism    . Malignant neoplasm of overlapping sites of right female breast 10/19/2015   . Vertigo        Available old records reviewed .    Past Surgical History:     Past Surgical History:   Procedure Laterality Date   . HYSTERECTOMY     . MASTECTOMY Right 2015       Family History:     Family History   Problem Relation Age of Onset   . Breast cancer Neg Hx        Social History:     Social History     Socioeconomic History   . Marital status: Widowed     Spouse name: Not on file   . Number of children: Not on file   . Years of education: Not on file   . Highest education level: Not on  file   Occupational History   . Not on file   Social Needs   . Financial resource strain: Not on file   . Food insecurity     Worry: Not on file     Inability: Not on file   . Transportation needs     Medical: Not on file     Non-medical: Not on file   Tobacco Use   . Smoking status: Never Smoker   . Smokeless tobacco: Never Used   Substance and Sexual Activity   . Alcohol use: Yes     Alcohol/week: 1.0 standard drinks     Types: 1 Cans of beer per week     Comment: socially   . Drug use: No   . Sexual activity: Not on file   Lifestyle   . Physical activity     Days per week: Not on file     Minutes per session: Not on file   . Stress: Not on file   Relationships   . Social Wellsite geologist on phone: Not on file     Gets together: Not on file     Attends religious service: Not  on file     Active member of club or organization: Not on file     Attends meetings of clubs or organizations: Not on file     Relationship status: Not on file   . Intimate partner violence     Fear of current or ex partner: Not on file     Emotionally abused: Not on file     Physically abused: Not on file     Forced sexual activity: Not on file   Other Topics Concern   . Not on file   Social History Narrative   . Not on file       Allergies:     Allergies   Allergen Reactions   . Caffeine      Personal preference   . Morphine Itching   . Morphine And Related Itching and Nausea And Vomiting     dizzy       Medications:   (Not in a hospital admission)          Review of Systems:   General ROS: negative for - chills,  fever  Psychological ROS: negative for - anxiety , depression  Ophthalmic ROS: negative for - blurry vision or eye pain  ENT ROS: negative for - nasal congestion or oral lesions  Allergy and Immunology ROS: negative for - hives  Hematological and Lymphatic ROS: negative for - bleeding problems  Endocrine ROS: negative for - temperature intolerance  Respiratory ROS: no cough, shortness of breath, or wheezing  Cardiovascular  ROS: no chest pain or dyspnea on exertion  Gastrointestinal ROS: no abdominal pain, change in bowel habits, or black or bloody stools  Genito-Urinary ROS: no dysuria, trouble voiding, or hematuria  Musculoskeletal ROS: negative for - joint pain or joint swelling  Neurological ROS: negative for - numbness/tingling or weakness  Dermatological ROS: negative for rash      Physical Exam:   Blood pressure 178/83, pulse 77, resp. rate 17, weight 70.3 kg (154 lb 15.7 oz), SpO2 98 %.    Intake and Output Summary (Last 24 hours) at Date Time  No intake or output data in the 24 hours ending 04/15/19 1508    General:  no acute distress.  HEENT: perrla, eomi, mucous membranes moist  Neck: supple  Cardiovascular: regular rate and rhythm, no murmurs.  Lungs: clear to auscultation bilaterally, without wheezing  Abdomen: soft, non-tender, non-distended; no palpable masses, normoactive bowel sounds  Extremities: no clubbing, cyanosis, or edema  Neuro: alert, awake, oriented , motor strength grossly intact.   Skin: warm and dry.  Heme: no bleeding.  Psych: moods stable.      Labs:   Labs reviewed .  Results     Procedure Component Value Units Date/Time    COVID-19 (SARS-CoV-2) [facility] Standard test) [[id number]] Collected:  04/15/19 1626    Specimen:  Nasopharyngeal Swab from Nasopharynx Updated:  04/15/19 1906     Purpose of COVID testing Diagnostic -PUI     SARS-CoV-2 Specimen Source Nasopharyngeal    Narrative:       o Collect and clearly label specimen type:  o PREFERRED-Upper respiratory specimen: One Nasopharyngeal  Swab in Transport Media.  o Hand deliver to laboratory ASAP    Hemoglobin A1C [[id number]] Collected:  04/15/19 1308    Specimen:  Blood Updated:  04/15/19 1855    Narrative:       This is NOT the correct Test for Patients with  Hemoglobinopathy.    Lipid panel [[id number]] Collected:  04/15/19  1308    Specimen:  Blood Updated:  04/15/19 1855    Narrative:       This is NOT the correct Test for Patients with   Hemoglobinopathy.  Fasting specimen    Troponin I [[id number]] Collected:  04/15/19 1626    Specimen:  Blood Updated:  04/15/19 1710     Troponin I <0.01 ng/mL     Troponin I [[id number]] Collected:  04/15/19 1308    Specimen:  Blood Updated:  04/15/19 1344     Troponin I <0.01 ng/mL     APTT [[id number]] Collected:  04/15/19 1308     Updated:  04/15/19 1338     PTT 33 sec     Comprehensive metabolic panel [[id number]]  (Abnormal) Collected:  04/15/19 1308    Specimen:  Blood Updated:  04/15/19 1338     Glucose 111 mg/dL      BUN 12 mg/dL      Creatinine 1.0 mg/dL      Sodium 401 mEq/L      Potassium 3.9 mEq/L      Chloride 101 mEq/L      CO2 30 mEq/L      Calcium 9.4 mg/dL      Protein, Total 7.2 g/dL      Albumin 4.0 g/dL      AST (SGOT) 20 U/L      ALT 12 U/L      Alkaline Phosphatase 100 U/L      Bilirubin, Total 0.3 mg/dL      Globulin 3.2 g/dL      Albumin/Globulin Ratio 1.3     Anion Gap 13.0    GFR [[id number]] Collected:  04/15/19 1308     Updated:  04/15/19 1338     EGFR >60.0    Prothrombin time/INR [[id number]]  (Abnormal) Collected:  04/15/19 1308    Specimen:  Blood Updated:  04/15/19 1337     PT 12.5 sec      PT INR 0.9    CBC and differential [[id number]]  (Abnormal) Collected:  04/15/19 1308    Specimen:  Blood Updated:  04/15/19 1321     WBC 7.45 x10 3/uL      Hgb 12.4 g/dL      Hematocrit 32.9 %      Platelets 353 x10 3/uL      RBC 4.71 x10 6/uL      MCV 82.6 fL      MCH 26.3 pg      MCHC 31.9 g/dL      RDW 14 %      MPV 11.3 fL      Neutrophils 64.8 %      Lymphocytes Automated 23.8 %      Monocytes 9.5 %      Eosinophils Automated 1.1 %      Basophils Automated 0.5 %      Immature Granulocytes 0.3 %      Nucleated RBC 0.0 /100 WBC      Neutrophils Absolute 4.83 x10 3/uL      Lymphocytes Absolute Automated 1.77 x10 3/uL      Monocytes Absolute Automated 0.71 x10 3/uL      Eosinophils Absolute Automated 0.08 x10 3/uL      Basophils Absolute Automated 0.04 x10 3/uL      Immature Granulocytes Absolute 0.02  x10 3/uL      Absolute NRBC 0.00 x10 3/uL     Glucose Whole Blood - POCT [[id number]]  (Abnormal) Collected:  04/15/19 1307  Updated:  04/15/19 1312     Whole Blood Glucose POCT 102 mg/dL           Imaging personally reviewed .  Radiology Results (24 Hour)     Procedure Component Value Units Date/Time    CT Angiogram Head Neck [[id number]] Collected:  04/15/19 1847    Order Status:  Completed Updated:  04/15/19 1904    Narrative:       CT ANGIOGRAM HEAD NECK    CLINICAL INDICATION:   Intermittent left face arm and leg numbness and  weakness sensation ongoing since yesterday rule out high-grade vascular  stenosis    TECHNIQUE:Serial axial images through the neck and head during  intravenous administration of 100 mL Omnipaque 350 contrast with  coronal, sagittal, axial and oblique reconstructions including MIP  reconstructions    Note that CT scanning at this site utilizes multiple dose reduction  techniques including automatic exposure control, adjustment of the MAA  and/or KVP according to the patient's size and use of iterative  reconstruction technique.    COMPARISON: CT angiogram from 05/14/2018    FINDINGS: CT angiogram head: There is no mass effect or midline shift.  Lateral ventricles are normal in size and configuration. Basilar  cisterns are patent.  The vertebrobasilar system is patent. Posterior cerebral arteries are  patent bilaterally. The distal internal carotid arteries, middle  cerebral arteries and anterior cerebral arteries are patent bilaterally.  No major branch occlusion is identified.  No acute osseous abnormality is identified.    CT angiogram neck: Orbits appear normal and symmetric.  Paranasal sinuses and mastoid air cells are well aerated.  There are degenerative changes spine.  There is a hypodense focus in the anterior aspect of the right lobe of  the thyroid gland that measures approximately 15 mm. This appears  similar to the prior study.  Atherosclerotic changes are noted in the common  carotid artery  bifurcations. There is not evidence of flow-limiting stenosis in the  common carotid arteries or internal carotid arteries. Vertebral arteries  are patent bilaterally. There is not evidence of flow-limiting stenosis.      Impression:           1.  No major branch occlusion is seen in the CT angiogram of the head.    2.  There is not evidence of flow-limiting stenosis in the internal  carotid arteries.            Tana Felts, MD   04/15/2019 7:02 PM    CT Head WO Contrast [[id number]] Collected:  04/15/19 1430    Order Status:  Completed Updated:  04/15/19 1435    Narrative:       CT HEAD WO CONTRAST    CLINICAL INDICATION:   Stroke, follow up    COMPARISON: 11/13/2018    TECHNIQUE: 5 mm axial images from the skull base to the vertex. The  following ?dose reduction techniques were utilized: automated exposure  control and/or adjustment of the mA and/or kV according to patient  size, and the use of iterative reconstruction technique.    FINDINGS:     The ventricles, cisterns, and sulci appear within normal size limits.  There is no mass effect or midline shift. There is no hemorrhage or  abnormal extra-axial fluid collection. The gray-white differentiation  appears maintained. Ill-defined white matter hypodensities are  nonspecific but likely represent microvascular ischemic changes.  Bone  windows demonstrate no evidence for acute osseous abnormality. The  included paranasal sinuses and mastoid  air cells appear clear.      Impression:        No acute intracranial process.    Sandie Ano, MD   04/15/2019 2:33 PM    Chest AP Portable [[id number]] Collected:  04/15/19 1315    Order Status:  Completed Updated:  04/15/19 1318    Narrative:       History: chest pain    Technique: Single Portable View    Comparison: 01/11/2019    Findings:  Mild airspace disease is seen in the right midlung zone  There is no pneumothorax.  The heart is normal in size.    The mediastinum is within normal limits.           Impression:        Mild increase in the degree of airspace disease  in right mid lung zone may represent atelectasis or pneumonitis    Laurena Slimmer, MD   04/15/2019 1:16 PM            Assessment:     Patient Active Problem List   Diagnosis   . Malignant neoplasm of overlapping sites of right female breast   . Hypertension   . Mixed hyperlipidemia   . Hypothyroid   . Right sided numbness   . Chest discomfort   . SOB (shortness of breath)   . Left sided numbness           Plan:   Admit to cardiac monitored bed   Isolation precautions   Serial Trop levels   PRN oxygen supplementation   Abnormal CXR, obtain procalcitonin level   PRN Bronchodilators   Antiplatelet agent   Fasting lipid panel   Resume statin   Check A1c level   Blood pressure monitoring and control   Hypothyroidism on synthroid   PRN Morphine IV for pain control   Stress ulcer prophylaxis  DVT prophylaxis   Neurology consult   Cardiology consult  Nutritional support   Code status: Full code          Signed by: Mosie Epstein, MD   YN:[id number], Dema Severin, MD

## 2019-04-15 NOTE — Consults (Addendum)
NEUROLOGY CONSULTATION    Date Time: 04/15/19 2:41 PM  Patient Name: Emily [name]  Attending Physician: Marland Mcalpine*      Assessment & Plan:   Intermittent left face arm and leg numbness tingling and weakness ongoing since yesterday not sure about etiology so far, CVA definitely needs to be ruled out  Prior history of high blood pressure high lipids  History of breast cancer with right breast mastectomy   Will obtain CT angios head and neck, given fluctuation in symptoms to look for high-grade vascular stenosis--- not for the purposes of intervention   If The CT angios are negative, will require MRI, will obtain brain and cervical spine MRIs given we were thinking about doing spinal MRI during the last hospital visit in February   ASA in meantime    Will also need to repeat A1c, and lipid panel   Hold off on echo for now  History of Present Illness:   Patient is a 73 year old lady comes into the hospital for new onset of left face arm and leg numbness neck pain shoulder pain.  She reports intermittent numbness and weakness in the left arm and left leg, since yesterday.  Her symptoms are fluctuating in nature, she is undergone a CT scan of the head in the ED which is negative    She has been hospitalized in February of this year, for vertigo symptoms, and also in August 2019, for Left face arm and leg numbness with negative MRIs at that time also    Past Medical History:     Past Medical History:   Diagnosis Date   . Breast cancer 2014    right breast mastectomy   . GERD (gastroesophageal reflux disease)    . Hyperlipidemia    . Hypertension    . Hypothyroidism    . Malignant neoplasm of overlapping sites of right female breast 10/19/2015   . Vertigo        Meds:   Norvasc aspirin Synthroid Lidoderm patches Zestril multivitamins Crestor Zocor B1 Arimidex    Allergies   Allergen Reactions   . Caffeine      Personal preference   . Morphine Itching   . Morphine And Related Itching and Nausea And  Vomiting     dizzy       Social & Family History:     Social History     Socioeconomic History   . Marital status: Widowed     Spouse name: Not on file   . Number of children: Not on file   . Years of education: Not on file   . Highest education level: Not on file   Occupational History   . Not on file   Social Needs   . Financial resource strain: Not on file   . Food insecurity     Worry: Not on file     Inability: Not on file   . Transportation needs     Medical: Not on file     Non-medical: Not on file   Tobacco Use   . Smoking status: Never Smoker   . Smokeless tobacco: Never Used   Substance and Sexual Activity   . Alcohol use: Yes     Alcohol/week: 1.0 standard drinks     Types: 1 Cans of beer per week     Comment: socially   . Drug use: No   . Sexual activity: Not on file   Lifestyle   . Physical activity  Days per week: Not on file     Minutes per session: Not on file   . Stress: Not on file   Relationships   . Social Wellsite geologist on phone: Not on file     Gets together: Not on file     Attends religious service: Not on file     Active member of club or organization: Not on file     Attends meetings of clubs or organizations: Not on file     Relationship status: Not on file   . Intimate partner violence     Fear of current or ex partner: Not on file     Emotionally abused: Not on file     Physically abused: Not on file     Forced sexual activity: Not on file   Other Topics Concern   . Not on file   Social History Narrative   . Not on file       Family History   Problem Relation Age of Onset   . Breast cancer Neg Hx            CODE STATUS: Full code no falls no elder abuse non-smoker  Review of Systems:   No headache, eye, ear nose, throat problems; no coughing or wheezing or shortness of breath, No chest pain or orthopnea, no abdominal pain, nausea or vomiting, No pain in the body or extremities, no psychiatric, neurological, endocrine, hematological or cardiac complaints except as noted above.      Has neck pain left shoulder pain  Physical Exam:   Blood pressure 178/83, pulse 77, resp. rate 17, weight 70.3 kg (154 lb 15.7 oz), SpO2 98 %.    HEENT: Normocephalic.no carotid bruits  Lungs:  CTA bil  Abd Soft   Cardiac:  S1,S2, normal rate and rhythm  Neck: supple, no cartoid bruits  Extremities:1+ edema  Skin: no rashes seen in exposed areas     Neuro:  Level of consciousness:  Alert and appropriate  Oriented:  X 3  Cognition:  Intact naming, recognition, concentration and following complex commands  Cranial Nerves:  II-XII intact hypophonic voice, somewhat difficult to understand  Strength:  No upper extremity drift, 5/5 strength x 4 extremities  Coordination:  Intact FTN testing  Reflexes:  +1 throughout, down going toes bil  Sensation: Intact x 4 extremities to LT, temp--> except for altered perception paresthesias in the left face arm and the leg  Musculoskeletal exam reveals significant discomfort on palpation of the paracervical regions of the trapezius on the left side    Labs:     Recent Labs   Lab 04/15/19  1308   Glucose 111*   BUN 12   Creatinine 1.0   Calcium 9.4   Sodium 144   Potassium 3.9   Chloride 101   CO2 30*   Albumin 4.0   AST (SGOT) 20   ALT 12   Bilirubin, Total 0.3   Alkaline Phosphatase 100     Recent Labs   Lab 04/15/19  1308   WBC 7.45   Hgb 12.4   Hematocrit 38.9   MCV 82.6   MCH 26.3   MCHC 31.9   Platelets 353*         Recent Labs     04/15/19  1308   PTT 33   PT 12.5*   PT INR 0.9          Radiology Results (24 Hour)  Procedure Component Value Units Date/Time    CT Head WO Contrast [[id number]] Collected:  04/15/19 1430    Order Status:  Completed Updated:  04/15/19 1435    Narrative:       CT HEAD WO CONTRAST    CLINICAL INDICATION:   Stroke, follow up    COMPARISON: 11/13/2018    TECHNIQUE: 5 mm axial images from the skull base to the vertex. The  following ?dose reduction techniques were utilized: automated exposure  control and/or adjustment of the mA and/or kV according  to patient  size, and the use of iterative reconstruction technique.    FINDINGS:     The ventricles, cisterns, and sulci appear within normal size limits.  There is no mass effect or midline shift. There is no hemorrhage or  abnormal extra-axial fluid collection. The gray-white differentiation  appears maintained. Ill-defined white matter hypodensities are  nonspecific but likely represent microvascular ischemic changes.  Bone  windows demonstrate no evidence for acute osseous abnormality. The  included paranasal sinuses and mastoid air cells appear clear.      Impression:        No acute intracranial process.    Sandie Ano, MD   04/15/2019 2:33 PM    Chest AP Portable [[id number]] Collected:  04/15/19 1315    Order Status:  Completed Updated:  04/15/19 1318    Narrative:       History: chest pain    Technique: Single Portable View    Comparison: 01/11/2019    Findings:  Mild airspace disease is seen in the right midlung zone  There is no pneumothorax.  The heart is normal in size.    The mediastinum is within normal limits.          Impression:        Mild increase in the degree of airspace disease  in right mid lung zone may represent atelectasis or pneumonitis    Laurena Slimmer, MD   04/15/2019 1:16 PM           All recent brain and spine imaging (MRI, CT) personally reviewed.    Chart reviewed    Case discussed with:      This note was generated by the Epic EMR system/Speech recognition and may contain inherent errors or omissions not intended by the user. Grammatical errors, random word insertions, deletions and pronoun errors  are occasional consequences of this technology due to software limitations.   Not all errors are caught or corrected. If there are questions or concerns about the content of this note or information contained within the body of this dictation they should be addressed directly with the author for clarification.    Signed by: Cathe Mons, MD  Spectralink: [id number]      Answering Service:  [phone]

## 2019-04-15 NOTE — ED Notes (Signed)
Bed: E08  Expected date:   Expected time:   Means of arrival:   Comments:  Held for 7

## 2019-04-15 NOTE — Plan of Care (Addendum)
The learning abilities of the patient have been assessed. Today's individualized plan of care includes neuro check Q4,prevent fall, stroke education. The plan of care was discussed with the patient, who agrees to it and demonstrates understanding of the disease process, risk factors, treatment plan, medications and consequences of noncompliance. All questions and concerns were addressed.  Problem: Safety  Goal: Patient will be free from injury during hospitalization  Outcome: Progressing  Flowsheets (Taken 04/15/2019 2336)  Patient will be free from injury during hospitalization:  . Provide and maintain safe environment  . Assess patient's risk for falls and implement fall prevention plan of care per policy     Problem: Day of Admission - Stroke  Goal: Core/Quality measure requirements - Admission  Outcome: Progressing  Flowsheets (Taken 04/15/2019 2336)  Core/Quality measure requirements - Admission:  . Document NIH Stroke Scale on admission  . Document nursing swallow/dysphagia screen on admission.   . VTE Prevention: Ensure anti-embolism stockings/devices documented as ordered  . Ensure antithrombotic administered   . Ensure lipid panel ordered  . Begin stroke education on admission (must include Modifiable Risk Factors, Warning Signs and Symptoms of Stroke, Activation of Emergency Medical System and Follow-up Appointments) Ensure handout has been given and documented.  . Ensure PT/OT and/or SLP ordered  Note: Brochure given for stroke education     Problem: Hemodynamic Status: Cardiac  Goal: Stable vital signs and fluid balance  Outcome: Progressing  Flowsheets (Taken 04/15/2019 2336)  Stable vital signs and fluid balance:  . Monitor vital signs and telemetry per unit protocol  . Weigh on admission and record weight daily  . Assess signs and symptoms associated with cardiac rhythm changes  . Monitor for leg swelling/edema and report to LIP if abnormal  Note: With +pitting edema BLE

## 2019-04-16 ENCOUNTER — Other Ambulatory Visit: Payer: Self-pay

## 2019-04-16 ENCOUNTER — Observation Stay: Payer: No Typology Code available for payment source

## 2019-04-16 DIAGNOSIS — R0789 Other chest pain: Secondary | ICD-10-CM

## 2019-04-16 LAB — LIPID PANEL
Cholesterol / HDL Ratio: 3.3
Cholesterol / HDL Ratio: 3.1
Cholesterol: 202 mg/dL — ABNORMAL HIGH (ref 0–199)
Cholesterol: 186 mg/dL (ref 0–199)
HDL: 62 mg/dL (ref 40–9999)
HDL: 60 mg/dL (ref 40–9999)
LDL Calculated: 124 mg/dL — ABNORMAL HIGH (ref 0–99)
LDL Calculated: 111 mg/dL — ABNORMAL HIGH (ref 0–99)
Triglycerides: 79 mg/dL (ref 34–149)
Triglycerides: 75 mg/dL (ref 34–149)
VLDL Calculated: 16 mg/dL (ref 10–40)
VLDL Calculated: 15 mg/dL (ref 10–40)

## 2019-04-16 LAB — ECG 12-LEAD
Atrial Rate: 72 {beats}/min
P Axis: 45 degrees
P-R Interval: 164 ms
Q-T Interval: 376 ms
QRS Duration: 94 ms
QTC Calculation (Bezet): 411 ms
R Axis: 12 degrees
T Axis: -58 degrees
Ventricular Rate: 72 {beats}/min

## 2019-04-16 LAB — HEMOGLOBIN A1C
Average Estimated Glucose: 111.2 mg/dL
Hemoglobin A1C: 5.5 % (ref 4.6–5.9)

## 2019-04-16 LAB — COVID-19 (SARS-COV-2): SARS CoV 2 Overall Result: NOT DETECTED

## 2019-04-16 LAB — HEMOLYSIS INDEX
Hemolysis Index: 21 — ABNORMAL HIGH (ref 0–18)
Hemolysis Index: 23 — ABNORMAL HIGH (ref 0–18)

## 2019-04-16 LAB — TROPONIN I: Troponin I: <0.01 ng/mL (ref 0.00–0.05)

## 2019-04-16 MED ORDER — GADOBUTROL 1 MMOL/ML IV SOLN
7.50 mL | Freq: Once | INTRAVENOUS | Status: AC | PRN
Start: 2019-04-16 — End: 2019-04-16
  Administered 2019-04-16: 6.5 mmol via INTRAVENOUS

## 2019-04-16 NOTE — Progress Notes (Signed)
Date Time: 04/16/19 11:42 AM  Patient Name: Emily [name]  Attending Physician: Mosie Epstein, MD  Patient Class: Observation  Hospital Day: 0            NEUROLOGY PROGRESS NOTE             Assessment/Plan   Intermittent left face arm and leg numbness tingling and weakness, prior history of high blood pressure  High lipids  History of breast cancer    CT angios head and neck was negative      Wait on brain MRI and will also go ahead and order cervical spine MRI given recurrent symptoms  We will follow-up      Subjective:   Patient Seen and Examined.  Feels better now no further events reported    Review of Systems:   No headache, eye, ear nose, throat problems; no coughing or wheezing or shortness of breath, No chest pain or orthopnea, no abdominal pain, nausea or vomiting, No pain in the body or extremities, no psychiatric, neurological, endocrine, hematological or cardiac complaints except as noted above.     Physical Exam:   BP 142/65   Pulse (!) 55   Temp 98.1 F (36.7 C) (Oral)   Resp 16   Ht 1.6 m (5\' 3" )   Wt 65.5 kg (144 lb 8 oz)   SpO2 100%   BMI 25.60 kg/m     Awake alert oriented x3 speech fluent smile symmetric moving both arms and legs well  Meds:      Scheduled Meds: PRN Meds:    amLODIPine, 5 mg, Oral, Daily  anastrozole, 1 mg, Oral, Daily  aspirin, 81 mg, Oral, QAM  levothyroxine, 25 mcg, Oral, QAM  lisinopril, 20 mg, Oral, BID  rosuvastatin, 40 mg, Oral, Daily  thiamine, 100 mg, Oral, QAM  vitamins/minerals, 1 tablet, Oral, QAM        Continuous Infusions:   acetaminophen, 650 mg, Q6H PRN  hydrALAZINE, 10 mg, Q6H PRN  morphine, 2 mg, Q4H PRN  ondansetron, 4 mg, 4X Daily PRN                Labs:     Recent Labs   Lab 04/15/19  1308   Glucose 111*   BUN 12   Creatinine 1.0   Calcium 9.4   Sodium 144   Potassium 3.9   Chloride 101   CO2 30*   Albumin 4.0   AST (SGOT) 20   ALT 12   Bilirubin, Total 0.3   Alkaline Phosphatase 100     Recent Labs   Lab 04/15/19  1308   WBC 7.45   Hgb 12.4    Hematocrit 38.9   MCV 82.6   MCH 26.3   MCHC 31.9   Platelets 353*         Recent Labs     04/15/19  1308   PTT 33   PT 12.5*   PT INR 0.9          Radiology Results (24 Hour)     Procedure Component Value Units Date/Time    CT Angiogram Head Neck [[id number]] Collected:  04/15/19 1847    Order Status:  Completed Updated:  04/15/19 1904    Narrative:       CT ANGIOGRAM HEAD NECK    CLINICAL INDICATION:   Intermittent left face arm and leg numbness and  weakness sensation ongoing since yesterday rule out high-grade vascular  stenosis    TECHNIQUE:Serial axial images through  the neck and head during  intravenous administration of 100 mL Omnipaque 350 contrast with  coronal, sagittal, axial and oblique reconstructions including MIP  reconstructions    Note that CT scanning at this site utilizes multiple dose reduction  techniques including automatic exposure control, adjustment of the MAA  and/or KVP according to the patient's size and use of iterative  reconstruction technique.    COMPARISON: CT angiogram from 05/14/2018    FINDINGS: CT angiogram head: There is no mass effect or midline shift.  Lateral ventricles are normal in size and configuration. Basilar  cisterns are patent.  The vertebrobasilar system is patent. Posterior cerebral arteries are  patent bilaterally. The distal internal carotid arteries, middle  cerebral arteries and anterior cerebral arteries are patent bilaterally.  No major branch occlusion is identified.  No acute osseous abnormality is identified.    CT angiogram neck: Orbits appear normal and symmetric.  Paranasal sinuses and mastoid air cells are well aerated.  There are degenerative changes spine.  There is a hypodense focus in the anterior aspect of the right lobe of  the thyroid gland that measures approximately 15 mm. This appears  similar to the prior study.  Atherosclerotic changes are noted in the common carotid artery  bifurcations. There is not evidence of flow-limiting stenosis in  the  common carotid arteries or internal carotid arteries. Vertebral arteries  are patent bilaterally. There is not evidence of flow-limiting stenosis.      Impression:           1.  No major branch occlusion is seen in the CT angiogram of the head.    2.  There is not evidence of flow-limiting stenosis in the internal  carotid arteries.            Tana Felts, MD   04/15/2019 7:02 PM    CT Head WO Contrast [[id number]] Collected:  04/15/19 1430    Order Status:  Completed Updated:  04/15/19 1435    Narrative:       CT HEAD WO CONTRAST    CLINICAL INDICATION:   Stroke, follow up    COMPARISON: 11/13/2018    TECHNIQUE: 5 mm axial images from the skull base to the vertex. The  following ?dose reduction techniques were utilized: automated exposure  control and/or adjustment of the mA and/or kV according to patient  size, and the use of iterative reconstruction technique.    FINDINGS:     The ventricles, cisterns, and sulci appear within normal size limits.  There is no mass effect or midline shift. There is no hemorrhage or  abnormal extra-axial fluid collection. The gray-white differentiation  appears maintained. Ill-defined white matter hypodensities are  nonspecific but likely represent microvascular ischemic changes.  Bone  windows demonstrate no evidence for acute osseous abnormality. The  included paranasal sinuses and mastoid air cells appear clear.      Impression:        No acute intracranial process.    Sandie Ano, MD   04/15/2019 2:33 PM    Chest AP Portable [[id number]] Collected:  04/15/19 1315    Order Status:  Completed Updated:  04/15/19 1318    Narrative:       History: chest pain    Technique: Single Portable View    Comparison: 01/11/2019    Findings:  Mild airspace disease is seen in the right midlung zone  There is no pneumothorax.  The heart is normal in size.    The mediastinum is within  normal limits.          Impression:        Mild increase in the degree of airspace disease  in right mid lung  zone may represent atelectasis or pneumonitis    Laurena Slimmer, MD   04/15/2019 1:16 PM           All brain imaging (MRI, CT) personally reviewed.    Case discussed with: pt       This note was generated by the Epic EMR system/ Dragon speech recognition and may contain inherent errors or omissions not intended by the user. Grammatical errors, random word insertions, deletions and pronoun errors  are occasional consequences of this technology due to software limitations.     Not all errors are caught or corrected. If there are questions or concerns about the content of this note or information contained within the body of this dictation they should be addressed directly with the author for clarification.      Signed by: Cathe Mons, MD  Spectralink: [id number]      Answering Service: [phone]

## 2019-04-16 NOTE — PT Eval Note (Addendum)
[facility]  [address]  [place], [place] [address]  [phone]    Physical Therapy Evaluation    Patient: Emily [name] MRN: [id number]   Unit: [facility] INTERMEDIATE CARE Bed: MI628/MI628-02    Time of Treatment: Time Calculation  PT Received On: 04/16/19  Start Time: 1210  Stop Time: 1235  Time Calculation (min): 25 min    Consult received for [name] for PT evaluation and treatment.  Patient's medical condition is appropriate for Physical Therapy  intervention at this time.    Interpreter utilized: no, not indicated      D/C Suggestions   Based on today's performance:  Recommendation  Discharge Recommendation: Home with no needs(anticipate will not have needs)  DME Recommended for Discharge: No additional equipment/DME recommended at this time  PT Frequency: 2-3x/wk.      Transport Recommendations: no limitations    Discharge recommendations are based on patient's progression/regression. Please see most recent note for updated discharge recommendations.    Assessment   Emily [name] is a 73 y.o. female admitted 04/15/2019 with new onset L face, UE, LE tingling, weakness. Pt reports fluctuation in symptoms, but reports resolution of symptoms at time of eval. Patient performed all bed mobility and transfers independently. She ambulated in room 30' with supervision only. Mild unsteadiness noted but patient reports she is a little shaky since she hasn't eaten since yesterday. She is NPO for a test. Strength in BLE was symmetrical and functional. Standing static balance was good. SLB on right 3- 4seconds, left <1 second. Able to perform marching without any LOB. Recommend 1-2 PT visits to ensure returns to independent baseline with steady gait and safety on stairs.       Pt's functional mobility is impacted by:  decreased activity tolerance and gait impairment.  There are a few comorbidities or other factors that affect plan of care and require modification of task including: has stairs to manage and  lives alone.  Standardized tests and exams incorporated into evaluation include AMPAC mobility, balance, coordination, Five Times Sit to Stand , ROM  and Strength.  Pt demonstrates a stable clinical presentation as symptoms have resolved at this time. Pt would continue to benefit from PT to address these deficits and increase functional independence.     Complexity Level Hx and Co  morbidites Examination Clinical Decision Making Clinical Presentation   Low  no impact 1-2 elements Limited options Stable       PMP - Progressive Mobility Protocol   PMP Activity: Step 6 - Walks in Room  Distance Walked (ft) (Step 6,7): 30 Feet       Rehabilitation Potential: good       Interdisciplinary Communication: with RN, OT      Plan       Plan  Risks/Benefits/POC Discussed with Pt/Family: With patient  Treatment/Interventions: Exercise;Gait training;Stair training;Neuromuscular re-education;Functional transfer training;Endurance training  PT Frequency: 2-3x/wk         Medical Diagnosis: Left sided numbness [R20.0]    History of Present Illness: KADIENCE [name] is a 73 y.o. female admitted on  04/15/2019 with  "new onset of left face arm and leg numbness neck pain shoulder pain. She reports intermittent numbness and weakness in the left arm and left leg, since yesterday. Her symptoms are fluctuating in nature, she is undergone a CT scan of the head in the ED which is negative    She has been hospitalized in February of this year, for vertigo symptoms,  and also in August 2019, forLeft face arm and leg numbness with negative MRIs at that time also"      Patient Active Problem List   Diagnosis   . Malignant neoplasm of overlapping sites of right female breast   . Hypertension   . Mixed hyperlipidemia   . Hypothyroid   . Right sided numbness   . Chest discomfort   . SOB (shortness of breath)   . Left sided numbness     Past Medical History:   Diagnosis Date   . Breast cancer 2014    right breast mastectomy   . GERD (gastroesophageal  reflux disease)    . Hyperlipidemia    . Hypertension    . Hypothyroidism    . Malignant neoplasm of overlapping sites of right female breast 10/19/2015   . Vertigo      Past Surgical History:   Procedure Laterality Date   . HYSTERECTOMY     . MASTECTOMY Right 2015       X-Rays/Tests/Labs:  Lab Results   Component Value Date/Time    HGB 12.4 04/15/2019 01:08 PM    HCT 38.9 04/15/2019 01:08 PM    K 3.9 04/15/2019 01:08 PM    NA 144 04/15/2019 01:08 PM    INR 0.9 04/15/2019 01:08 PM    TROPI <0.01 04/16/2019 05:54 AM    TROPI <0.01 04/15/2019 04:26 PM    TROPI <0.01 04/15/2019 01:08 PM    TROPI 0.02 01/11/2019 05:10 PM    TROPI 0.01 07/15/2009 09:40 AM     Head WO Contrast (Order: [id number]) - 04/15/2019   IMPRESSION:   No acute intracranial process.    Social History:  Lives alonein a apartment.  Entry Steps: 1 flight, no elevator accessRails: yesInside steps: 0Rails: 0  Equipment at home: tub/shower combination  Prior Level of Function: indep all ADL/IADL, no DME/AE at home, cooks, drives  Cognition: WFL  Mobility: independent  Feeding: independent  Grooming: independent  Bathing: independent  Dressing: independent  Toileting: independent    Subjective   Patient is agreeable to participation in the therapy session. Nursing clears patient for therapy.  Patient's Goal:  Go home  Pain: pt denies pain    Objective     Precautions/ Contraindications:   Precautions  Weight Bearing Status: no restrictions  Other Precautions: contact/droplet    Patient is in bed with  Telemetry, Intravenous Access, Sequential Compression Device (SCD) and Bed Alarm in place.       Observation of patient/vitals:      Vitals:    04/16/19 0404 04/16/19 0404 04/16/19 0816 04/16/19 1154   BP:  127/61 142/65 136/66   Pulse: 60 60 (!) 55 (!) 59   Resp:  17 16 16    Temp:  97.7 F (36.5 C) 98.1 F (36.7 C) 98.1 F (36.7 C)   TempSrc:  Oral Oral Oral    SpO2:  100% 100% 99%   Weight:       Height:           Orientation/Cognition:  Alert and Oriented x 4  Cognition: WFL      Musculoskeletal Examination:      ROM Strength   Neck/ Trunk WFl WFL   RLE WFL WFL   LLE [facility] WFL     Sensation: Intact to light touch, localization, proprioception throughout.    Coordination: intact    Functional Mobility:  Rolling: NT    Supine to sit: independent  Scooting: independent  Sit to Supine:  independent  Sit to stand: independent  Stand to sit: independent  Transfers: independent    Ambulation:     Weightbearing: no restrictions   Assistance level: supervision    Distance: 30'   Assistive Device: none   Gait Deviations: mild unsteadiness (patient reports she feels a little shaky since she hasn't eaten)   Stairs: NT    Balance:  Static Sit: good  Dynamic Sit: good  Static Stand: good  Dynamic Stand: good-/fair+    Endurance: good for selected activites    Participation:  good    Education:  Educated the patient to role of physical therapy, plan of care, goals  of therapy, rationale for progressing mobility and safety with mobility and ADLs.    RN notified of session outcome and that patient was left in bed with all needs met and equipment intact.   Safety measures include: handoff to nurse/clin tech/ unit secretary completed, bed alarm activated, oriented to call bell and placed within reach, personal items within reach and bed placed in lowest position.   Mobility and ADL status posted at bedside and within E.M.R.    AM-PACT Inpatient Short Forms  Inpatient AM-PACT Performed? (PT): Basic Mobility Inpatient Short Form   AM-PACT "6 Clicks" Basic Mobility Inpatient Short Form  Turning Over in Bed: None  Sitting Down On/Standing From Armchair: None  Lying on Back to Sitting on Side of Bed: None  Assist Moving to/from Bed to Chair: None  Assist to Walk in Hospital Room: None  Assist to Climb 3-5 Steps with Railing: A little  PT Basic Mobility Raw Score: 23  CMS 0-100% Score:  11.20%      Goals  Goal Formulation: With patient  Time for Goal Acheivement: 2 visits  Goals: Select goal  Pt Will Ambulate: > 200 feet;independent  Pt Will Go Up / Down Stairs: 1 flight;with supervision;With rail       Attention MD:   Thank you for allowing [place] to participate in the care of [facility]. Regulations from the [employer] for Medicare and Medicaid Services (CMS) require your review and approval of this plan of care.     Please cosign this note indicating you are in agreement with theTherapy Plan of Care so we may initiate the therapy treatment plan.    Therapist PPE during session procedural mask, N95 mask , face shield , gown  and gloves     Signature:  Emi Belfast, PT  04/16/2019  1:11 PM  Phone: [phone]     (For scheduling questions, please contact rehab tech [phone])

## 2019-04-16 NOTE — OT Eval Note (Signed)
[facility]  [address]  [place], [place] [address]  [phone]    Occupational Therapy Evaluation    Patient: Emily [name] MRN: [id number]   Unit: [facility] INTERMEDIATE CARE Bed: MI628/MI628-02    Time of treatment: Time Calculation  OT Received On: 04/16/19  Start Time: 0950  Stop Time: 1008  Time Calculation (min): 18 min    Consult received for Emily [name] for OT evaluation and treatment.  Patient's medical condition is appropriate for Occupational Therapy  intervention at this time.    Interpreter utilized: no, not indicated    D/C Suggestions   Home with assist for IADL    Transport Recommendations: no limitations    Assessment       Emily [name] is a 73 y.o. female admitted 04/15/2019.  Pt admitted with new onset L face, UE, LE tingling, weakness. Pt reports fluctuation in symptoms, but reports resolution of symptoms at time of eval. Pt demos ability to perform bed mobility, transfers indep, mobility in room with supv 20'. Pt reports she is close to baseline at this time.   Brief chart review completed including review of labs review of imaging review of vitals.  Pt's ability to complete ADLs and functional transfers is at/near fxn'l baseline. Pt does not have any acute OT needs at this time. D/C OT.         Complexity Chart Review Performance Deficits Clinical Decision Making Hx/Comorbidities Assistance needed   Low  Brief  1-3 Limited options None None (or at baseline)       PMP - Progressive Mobility Protocol   PMP Activity: Step 6 - Walks in Room  Distance Walked (ft) (Step 6,7): 20 Feet         Interdisciplinary Communication: discussed with PT/RN    Plan     OT Plan  Risks/Benefits/POC Discussed with Pt/Family: With patient  Treatment Interventions: No skilled interventions needed at this time  Discharge Recommendation: Home with home health OT  DME Recommended for Discharge: Shower chair  OT Frequency Recommended: one time visit - therapy discontinued         Medical Diagnosis: Left  sided numbness [R20.0]    History of Present Illness: Emily [name] is a 73 y.o. female admitted on  04/15/2019 with "new onset of left face arm and leg numbness neck pain shoulder pain.  She reports intermittent numbness and weakness in the left arm and left leg, since yesterday.  Her symptoms are fluctuating in nature, she is undergone a CT scan of the head in the ED which is negative    She has been hospitalized in February of this year, for vertigo symptoms, and also in August 2019, for Left face arm and leg numbness with negative MRIs at that time also"      Patient Active Problem List   Diagnosis   . Malignant neoplasm of overlapping sites of right female breast   . Hypertension   . Mixed hyperlipidemia   . Hypothyroid   . Right sided numbness   . Chest discomfort   . SOB (shortness of breath)   . Left sided numbness     Past Medical History:   Diagnosis Date   . Breast cancer 2014    right breast mastectomy   . GERD (gastroesophageal reflux disease)    . Hyperlipidemia    . Hypertension    . Hypothyroidism    . Malignant neoplasm of overlapping sites of right female breast 10/19/2015   .  Vertigo      Past Surgical History:   Procedure Laterality Date   . HYSTERECTOMY     . MASTECTOMY Right 2015         X-Rays/Tests/Labs:  Lab Results   Component Value Date/Time    HGB 12.4 04/15/2019 01:08 PM    HCT 38.9 04/15/2019 01:08 PM    K 3.9 04/15/2019 01:08 PM    NA 144 04/15/2019 01:08 PM    INR 0.9 04/15/2019 01:08 PM    TROPI <0.01 04/16/2019 05:54 AM    TROPI <0.01 04/15/2019 04:26 PM    TROPI <0.01 04/15/2019 01:08 PM    TROPI 0.02 01/11/2019 05:10 PM    TROPI 0.01 07/15/2009 09:40 AM   Head WO Contrast (Order: [id number]) - 04/15/2019   IMPRESSION:    No acute intracranial process.    Social History:  Lives alonein a apartment.  Entry Steps: 1 flight, no elevator accessRails: yesInside steps: 0Rails: 0  Equipment at home: tub/shower combination  Prior Level of Function: indep all ADL/IADL, no DME/AE at  home, cooks, drives  Cognition: WFL  Mobility: independent  Feeding: independent  Grooming: independent  Bathing: independent  Dressing: independent  Toileting: independent      Subjective   "I am fine now"  Patient is agreeable to participation in the therapy session. Nursing clears patient for therapy.  Patient's Goal:  To go home  Pain: 5/10  Location: back  Therapist Intervention: positioned for comfort  Patient is satisfied with therapist intervention.    Objective     Precautions:   Precautions  Weight Bearing Status: no restrictions  Other Precautions: contact/droplet    Patient is in bed with  Telemetry, Intravenous Access and Bed Alarm  in place.       Observation of patient/vitals:   Vitals:    04/16/19 0403 04/16/19 0404 04/16/19 0404 04/16/19 0816   BP:   127/61 142/65   Pulse: 65 60 60 (!) 55   Resp:   17 16   Temp:   97.7 F (36.5 C) 98.1 F (36.7 C)   TempSrc:   Oral Oral   SpO2:   100% 100%   Weight:       Height:           Orientation/Cognition:     Alert and Oriented x 4  Cognition: follows all commands      Musculoskeletal Examination:     ROM Strength   RUE WFL WFL   LUE WFL WFL       Sensation: Intact to light touch, denies N/T throughout B UE's.   Coordination: Intact gross motor and serial opposition to B hands    Vision: WFL  Hearing: WFL      Functional Mobility:   Supine to sit: indep   Scooting: indep   Sit to Supine: indep   Sit to stand: indep   Stand to sit: indep   Transfers: indep    Ambulation: supv no AD 20'    Balance:  Static Sit Balance: good   Dynamic Sit Balance: good   Static Stand Balance: good   Dynamic Stand Balance: good     Self Care:  Eating: indep   Grooming: indep   LB Dressing: indep, socks        Endurance: good    Participation:  good    Education:  Educated the patient to role of occupational therapy, plan of care, goals  of therapy and safety with mobility and ADLs.  RN  notified of session outcome and that patient was left in bed with all needs met and equipment intact.   Safety measures include: handoff to nurse/clin tech/ unit secretary completed, bed alarm activated, oriented to call bell and placed within reach, personal items within reach, assistive device positioned out of reach and bed placed in lowest position.   Mobility and ADL status posted at bedside and within E.M.R.                Goals:  Goals  Goal Formulation: Patient  Time For Goal Achievement: (no goals indicated)    Therapist PPE during session face shield , gown , gloves  and respirator     Signature:   Ermin Parisien, OTR/L  04/16/2019  10:32 AM  Phone [phone]    (For scheduling questions, please contact rehab tech [phone])

## 2019-04-16 NOTE — Progress Notes (Addendum)
Healthcare Decisions  Advance Directive: Patient does not have advance directive  Healthcare Agent Appointed: No    Prior to admission  Type of Residence: Other (Comment)(apartment)  Living Arrangements: Alone  Dressing: Independent  Grooming: Independent  Feeding: Independent  Bathing: Independent  Toileting: Independent      PTOT EVAL (P)    WU (P)    DCP: HOME      PCP NOTIFICATION  Patient/patient designee was asked if patient/patient designee wanted to have his/her PCP notified. Patient/patient designee stated yes. CM called PCP and notified the physician that the patient is now hospitalized.

## 2019-04-16 NOTE — Plan of Care (Signed)
Problem: Every Day - Stroke  Goal: Neurological status is stable or improving  Flowsheets (Taken 04/16/2019 1812)  Neurological status is stable or improving:   Monitor/assess/document neurological assessment (Stroke: every 4 hours)   Perform CAM Assessment  Note: Pt is alert and oriented x4, SB on the monitor. Pt is able to follow commands, strong grips in all extremities. Able to move her extremities, sensation improved throughout the day.   Goal: Stable vital signs and fluid balance  Flowsheets (Taken 04/16/2019 1812)  Stable vital signs and fluid balance:   Position patient for maximum circulation/cardiac output   Monitor and assess vitals every 4 hours or as ordered and hemodynamic parameters   Encourage oral fluid intake   Apply telemetry monitor as ordered   Monitor intake and output. Notify LIP if urine output is < 30 mL/hour.  Goal: Mobility/Activity is maintained at optimal level for patient  Flowsheets (Taken 04/16/2019 1812)  Mobility/activity is maintained at optimal level for patient:   Increase mobility as tolerated/progressive mobility   Encourage independent activity per ability   Plan activities to conserve energy, plan rest periods   Consult/collaborate with Physical Therapy and/or Occupational Therapy   Assess for changes in respiratory status, level of consciousness and/or development of fatigue   Perform active/passive ROM     Problem: Hemodynamic Status: Cardiac  Goal: Stable vital signs and fluid balance  Flowsheets (Taken 04/16/2019 1812)  Stable vital signs and fluid balance:   Monitor/assess vital signs and telemetry per unit protocol   Monitor lab values   Monitor for leg swelling/edema and report to LIP if abnormal   Monitor intake/output per unit protocol and/or LIP order   Assess signs and symptoms associated with cardiac rhythm changes   Pt to be NPO at midnight tomorrow for stress test. MRI schedule for later today. Pt Covid-19 test is negative, pt is off isolation.     The learning  abilities of the patient and/or caregiver have been assessed. Today's individualized plan of care includes neurological assessment and vital sign check every 4 hours. The patient or caregiver states the following personal goal related to the patient's deficit(s): "By discharge I want to be able to not have numbness and tingling on my left side." The plan of care was discussed with the patient and/or caregiver, who agrees to it and demonstrates understanding of the disease process, risk factors, treatment plan, medications and consequences of noncompliance. All questions and concerns were addressed.

## 2019-04-16 NOTE — Nursing Progress Note (Signed)
Patient still verbalized right sided weakness and numbness, limb strength 5/5 on all extremities. Grip strong.  Pt alert oriented x 4. Sinus rhythm sometimes sinus brady on tele. Room air no SOB.   Walks to bathroom with minimal assist, complains of dizziness and headache but experienced relied after tylenol.  Stress tess today NPO after midnight instructed.  PT agreeable for MRI.

## 2019-04-16 NOTE — SLP Eval Note (Signed)
[facility] [place] [facility]  [address]  [place], [place] [address]  [phone]    Attention MD:   Thank you for allowing [place] to participate in the care of [facility]. Regulations from the Center for Medicare and Medicaid Services (CMS) require your review and approval of this plan of care.     Please cosign this note indicating you are in agreement with theTherapy Plan of Care so we may initiate the therapy treatment plan.      SPEECH LANGUAGE COGNITIVE SWALLOW EVALUATION    Patient: Emily [name]    MRN#: [id number]    Date/Time of Evaluation:   Time Calculation  SLP Received On: 04/16/19  Start Time: 0817  Stop Time: 0840  Time Calculation (min): 23 min  Total Treatment Time (min): 20    Consult received for Emily [name] for SLP Evaluation and Treatment.    Referring Physician: Dr. Higinio Plan    Date of Referral: 04/16/2019    Interpreter utilized: no, not indicated    Therapist PPE during session procedural mask, N95 mask , face shield , gown  and gloves     Assessment and Clinical Impression   Emily [name] is a 73 y.o. female admitted 04/15/2019 for  Left sided numbness [R20.0] presenting with speech, language, cognitive linguistic skills, and swallow grossly WFL and at baseline. Consult received per stroke protocol as pt admitted with resolving L hemibody paresthesias. Oral motor exam reveals intact symmetry and ROM. No s/s aspiration with sips thin water. Passed nursing dysphagia screen and pt reports no concerns re: swallowing. Further PO deferred d/t pending stress test. D/c SLP.       Indication for instrumental assessment of swallow: n/a  Plan   Goals of Care: treatment/curative pathway  Plan / Recommendations  Plan: begin/continue oral diet, no SLP f/u necessary  Diet Solids Recommendation: regular  Diet Liquids Recommendations: thin consistency  Precautions/Compensations: Awake/alert, Upright 90 degrees for all oral intake  Recommended Form of Meds: whole, PO  Suggestions for Feeding:  independent  Recommendation Discussed With: : Patient      Goals: n/a  Rehabilitation Prognosis: n/a n/a      History     Prior Speech Therapy Intervention: n/a    Medical Diagnosis: Left sided numbness [R20.0]    History of Present Illness: Emily [name] is a 73 y.o. female admitted on 04/15/2019 with   Patient Active Problem List   Diagnosis   . Malignant neoplasm of overlapping sites of right female breast   . Hypertension   . Mixed hyperlipidemia   . Hypothyroid   . Right sided numbness   . Chest discomfort   . SOB (shortness of breath)   . Left sided numbness        Past Medical/Surgical History  Past Medical History:   Diagnosis Date   . Breast cancer 2014    right breast mastectomy   . GERD (gastroesophageal reflux disease)    . Hyperlipidemia    . Hypertension    . Hypothyroidism    . Malignant neoplasm of overlapping sites of right female breast 10/19/2015   . Vertigo       Past Surgical History:   Procedure Laterality Date   . HYSTERECTOMY     . MASTECTOMY Right 2015             Subjective   Patient is agreeable to participation in the therapy session. Patient's medical condition is  appropriate for speech therapy intervention at  this time.  Patient's goal:  I don't know why this happened.   Pain: pt denies pain      Social History: Patient resides alone and is independent with functional tasks.      Objective     Respiratory Status  Current Status  Respiratory Status: within normal limits      Oral Motor Skills  Labial ROM: Within Functional Limits  Labial Symmetry: Within Functional Limits     Lingual ROM: Within Functional Limits  Lingual Symmetry: Within Functional Limits  Lingual Sensation: Within Functional Limits  Velum: Within Functional Limits  Mandible: Within Functional Limits  Facial ROM: Within Functional Limits  Facial Symmetry: Within Functional Limits  Facial Sensation: Within Functional Limits  Vocal Quality: Within Functional Limits     Vocal Intensity: Within Functional Limits     Apraxia:  None present     Breath Support: Adequate for speech  Dentition: Adequate  Hearing: Within Functional Limits       Deglutition Skills  Position: upright 90 degrees  Food(s) Tested: thin liquid  Oral Stage: adequate  Pharyngeal Stage: adequate  Esophageal Stage: appears WFL    Language Comprehension  Auditory Comprehension: Within Functional Limits           Following Commands: Follows one step commands without difficulty    Language Expression  Single Words: WFL  Connected Speech: WFL             Cognitive Linguistic Skills  Arousal/Alertness: Appropriate responses to stimuli  Attention Span: Appears intact  Orientation Level: Oriented X4  Memory: Appears intact  Safety Awareness: independent  Insights: Fully aware of deficits  Problem Solving: minimal assistance          Formal Testing  n/a        Signature  Gaylan Gerold, M.Ed CCC-SLP  04/16/2019  Phone: [phone]  [phone] for Rehab Tech/scheduling questions)

## 2019-04-16 NOTE — Progress Notes (Signed)
CARDIOLOGY HOSPITAL NOTE  Date:  04/16/2019  12:38 PM    Delva Derden Alyson Locket, MD [facility] Cardiology  Office phone:  [phone]     [facility] phone [phone]  [facility] phone [phone]    Patient:  Emily [name].  DOB: 06-May-1946.  73 y.o.  female  Date of Admission:  04/15/2019  Attending:  Mosie Epstein, MD     ASSESSMENT & PLAN:  ###   73 y.o. female presented 04/15/2019 left arm/leg symptoms and CP    DIAGNOSES:  *chest pain --- await COVID test results prior to proceeding with Lexiscan nuclear study which is now planned for tomorrow    *possible CVA  *HTN  *HLD    HISTORY OF PRESENT ILLNESS / SUBJECTIVE:  ###   Patient denies chest pain and shortness of breath    DIAGNOSTICS:                                               PAST MEDICAL HISTORY:  ###     Past Medical History:   Diagnosis Date   . Breast cancer 2014    right breast mastectomy   . GERD (gastroesophageal reflux disease)    . Hyperlipidemia    . Hypertension    . Hypothyroidism    . Malignant neoplasm of overlapping sites of right female breast 10/19/2015   . Vertigo      SOCIAL HISTORY:   Never smoker    PHYSICAL EXAM:  ###                                                 BP 136/66   Pulse (!) 59   Temp 98.1 F (36.7 C) (Oral)   Resp 16   Ht 1.6 m (5\' 3" )   Wt 65.5 kg (144 lb 8 oz)   SpO2 99%   BMI 25.60 kg/m      Intake/Output Summary (Last 24 hours) at 04/16/2019 1238  Last data filed at 04/15/2019 1950  Gross per 24 hour   Intake 400 ml   Output --   Net 400 ml     >> General:   no distress,  >> Respiratory:   unlabored respiration,  no crackles,  >> Cardiovascular:   regular rhythm,  No LE edema,  >> Musculoskeletal:   normal muscle tone and strength upper extremities >> Skin:   warm and dry,  >> Neuro/Psych:   Alert,  Appropriate mood and affect,      LABORATORY:  ###     CBC w/Diff   Recent Labs   Lab 04/15/19  1308   WBC 7.45   Hgb 12.4   Hematocrit 38.9   Platelets 353*        Basic Metabolic Profile    Recent Labs   Lab 04/15/19  1308   Sodium 144   Potassium 3.9   Chloride 101   CO2 30*   BUN 12   Creatinine 1.0   EGFR >60.0   Glucose 111*   Calcium 9.4        Cardiac Enzymes   Recent Labs   Lab 04/16/19  0554 04/15/19  1626 04/15/19  1308   Troponin  I <0.01 <0.01 <0.01            Coagulation Studies   Recent Labs   Lab 04/15/19  1308   PT 12.5*   PT INR 0.9   PTT 33        MEDICATIONS:  ###    INFUSION MEDS:      SCHEDULED MEDS:     Current Facility-Administered Medications   Medication Dose Route Frequency   . amLODIPine  5 mg Oral Daily   . anastrozole  1 mg Oral Daily   . aspirin  81 mg Oral QAM   . levothyroxine  25 mcg Oral QAM   . lisinopril  20 mg Oral BID   . rosuvastatin  40 mg Oral Daily   . thiamine  100 mg Oral QAM   . vitamins/minerals  1 tablet Oral QAM      PRN MEDS:   acetaminophen, hydrALAZINE, morphine, ondansetron    MISCELLANEOUS:  ###

## 2019-04-16 NOTE — UM Notes (Signed)
Observation review    Primary payer: Medicare [employer]    HPI 04/15/19    "Patient is a 73 year old lady comes into the hospital for new onset of left face arm and leg numbness neck pain shoulder pain.  She reports intermittent numbness and weakness in the left arm and left leg, since yesterday.  Her symptoms are fluctuating in nature, she is undergone a CT scan of the head in the ED which is negative    She has been hospitalized in February of this year, for vertigo symptoms, and also in August 2019, for Left face arm and leg numbness with negative MRIs at that time also"      Blood pressure 178/83, pulse 77, resp. rate 17, weight 70.3 kg (154 lb 15.7 oz), SpO2 98 %.        Plan: per Neurology  "Intermittent left face arm and leg numbness tingling and weakness ongoing since yesterday not sure about etiology so far, CVA definitely needs to be ruled out  Prior history of high blood pressure high lipids  History of breast cancer with right breast mastectomy  ? Will obtain CT angios head and neck, given fluctuation in symptoms to look for high-grade vascular stenosis--- not for the purposes of intervention  ? If The CT angios are negative, will require MRI, will obtain brain and cervical spine MRIs given we were thinking about doing spinal MRI during the last hospital visit in February  ? ASA in meantime   ? Will also need to repeat A1c, and lipid panel  Hold off on echo for now"      Plan: per Cardiology  "Left-sided numbness and tingling  -Atypical chest discomfort  -History of hypertension  -History of hyperlipidemia"        Initially placed in Inpatient ton 04/15/19 but changed to Observation today, 7.13/20      Place (admit) for Observation Services (Order [id number])   Admission   Date: 04/16/2019 at 0930

## 2019-04-16 NOTE — Progress Notes (Signed)
Pt is back on her diet, tests cannot be completed at this moment pending Covid-19 result. Tests are reschedule to tomorrow.

## 2019-04-16 NOTE — Progress Notes (Signed)
Emily [name] MRN: [id number]  73 y.o.  female    NUTRITION:  Reason for assessment:  MST score 2.  Spoke with pt via telephone.  She reports good appetite and intake of lunch today, eating 100% because "I didn't eat anything yesterday".  Identifies wt loss of 15# in the past 2-3 months.  Noted significant wt loss of 6.7% in the past 30 days per ct review.  Pt reports she just wasn't feeling well.    Assessment   Past Medical History:   Diagnosis Date   . Breast cancer 2014    right breast mastectomy   . GERD (gastroesophageal reflux disease)    . Hyperlipidemia    . Hypertension    . Hypothyroidism    . Malignant neoplasm of overlapping sites of right female breast 10/19/2015   . Vertigo      Wt Readings from Last 30 Encounters:   04/15/19 65.5 kg (144 lb 8 oz)   03/15/19 70.3 kg (155 lb)   01/11/19 71.6 kg (157 lb 13.6 oz)   01/01/19 71.6 kg (157 lb 13.6 oz)   11/13/18 71.8 kg (158 lb 3.2 oz)   11/13/18 74 kg (163 lb 2.3 oz)   09/13/18 73.5 kg (162 lb)   07/18/18 73.9 kg (163 lb)   06/20/18 73.5 kg (162 lb)   05/14/18 73.3 kg (161 lb 8 oz)   05/09/18 74.4 kg (164 lb)   04/03/18 74.1 kg (163 lb 4.8 oz)   03/15/18 73.9 kg (163 lb)   02/07/18 75.6 kg (166 lb 10.7 oz)   01/19/18 73.5 kg (162 lb)   12/13/17 76.1 kg (167 lb 12.8 oz)   09/25/17 76.2 kg (168 lb)   09/13/17 75.1 kg (165 lb 9.1 oz)   07/10/17 75.4 kg (166 lb 3.6 oz)   06/23/17 76 kg (167 lb 9.6 oz)   05/27/17 74 kg (163 lb 3.2 oz)   05/26/17 73.5 kg (162 lb)   05/26/17 73.5 kg (162 lb)   01/18/17 73 kg (160 lb 15 oz)   11/25/16 73 kg (161 lb)   09/28/16 75.8 kg (167 lb)   09/12/16 75 kg (165 lb 5.5 oz)   08/09/16 75.3 kg (166 lb)   07/24/16 77.2 kg (170 lb 3.1 oz)   04/23/16 74.8 kg (165 lb)     Social History     Socioeconomic History   . Marital status: Widowed     Spouse name: Not on file   . Number of children: Not on file   . Years of education: Not on file   . Highest education level: Not on file   Occupational History   . Not on file   Social Needs   .  Financial resource strain: Not on file   . Food insecurity     Worry: Not on file     Inability: Not on file   . Transportation needs     Medical: Not on file     Non-medical: Not on file   Tobacco Use   . Smoking status: Never Smoker   . Smokeless tobacco: Never Used   Substance and Sexual Activity   . Alcohol use: Yes     Alcohol/week: 1.0 standard drinks     Types: 1 Cans of beer per week     Comment: socially   . Drug use: No   . Sexual activity: Not on file   Lifestyle   . Physical activity     Days  per week: Not on file     Minutes per session: Not on file   . Stress: Not on file   Relationships   . Social Wellsite geologist on phone: Not on file     Gets together: Not on file     Attends religious service: Not on file     Active member of club or organization: Not on file     Attends meetings of clubs or organizations: Not on file     Relationship status: Not on file   . Intimate partner violence     Fear of current or ex partner: Not on file     Emotionally abused: Not on file     Physically abused: Not on file     Forced sexual activity: Not on file   Other Topics Concern   . Not on file   Social History Narrative   . Not on file       Active Hospital Problems    Diagnosis   . Left sided numbness     Allergies   Allergen Reactions   . Caffeine      Personal preference   . Morphine Itching   . Morphine And Related Itching and Nausea And Vomiting     dizzy     GI Symptoms: WDL  Skin: Intact      Orders Placed This Encounter   Procedures   . Diet NPO time specified Except for: SIPS WITH MEDS   . Diet cardiac       Current Meds:  amLODIPine, 5 mg, Daily  anastrozole, 1 mg, Daily  aspirin, 81 mg, QAM  levothyroxine, 25 mcg, QAM  lisinopril, 20 mg, BID  rosuvastatin, 40 mg, Daily  thiamine, 100 mg, QAM  vitamins/minerals, 1 tablet, QAM          Recent Labs:  Recent Labs   Lab 04/15/19  1308   WBC 7.45   Hgb 12.4   Hematocrit 38.9   MCV 82.6   Platelets 353*     Recent Labs   Lab 04/15/19  1308   Sodium 144    Potassium 3.9   Chloride 101   CO2 30*   BUN 12   Creatinine 1.0   Glucose 111*   Calcium 9.4   EGFR >60.0     Recent Labs   Lab 04/15/19  1308   Albumin 4.0       Intake/Output Summary (Last 24 hours) at 04/16/2019 1636  Last data filed at 04/15/2019 1950  Gross per 24 hour   Intake 400 ml   Output --   Net 400 ml     Orders Placed This Encounter      Diet NPO time specified Except for: SIPS WITH MEDS      Diet cardiac      Anthropometrics  Height: 160 cm (5\' 3" )  Weight: 65.5 kg (144 lb 8 oz)  Weight Change: -1.3  IBW/kg (Calculated) Female: 56.37 kg  IBW/kg (Calculated) Female: 52.27 kg  BMI (calculated): 26    Estimated Nutrition Needs:  Estimated Energy Needs  Total Energy Estimated Needs: 1355-1468 calories  Method for Estimating Needs: MSJ 1.2-1.3    Estimated Protein Needs  Total Protein Estimated Needs: 66g  Method for Estimating Needs: 1.0g/kg    Estimated Carbohydrate Needs  Total Carbohydrate Estimated Needs: 169-183g  Method for Estimating Needs: 50% total calories    Fluid Needs  Total Fluid Estimated Needs: 1400-1575mL or per MD  Method for Estimating Needs: 61mL/kcal or per MD    Learning & Discharge Planning Needs: None at this time  Religious/Cultural Food Practices: None noted    Nutrition Diagnosis:     Unintentional weight loss related to suboptimal po intake as evidenced by wt loss of 6.7% in the past 30 days.      Intervention:  Will continue cardiac diet in place.  Add Ensure Enlive BID.  Encourage po intake, honor food preferences as able.    Goals:  Meal intake of 75% or more through LOS    M/E:  Monitor po intake, tolerance.  Monitor wt changes, fluid status and lab trends.   Follow within 5-7 days.    Darcella Cheshire, RD  04/16/19 @ 16:39PM

## 2019-04-16 NOTE — Progress Note - Problem Oriented Charting Notewrit (Signed)
MEDICINE PROGRESS NOTE    Date Time: 04/16/19 6:50 PM  Patient Name: Emily [name]  Attending Physician: Mosie Epstein, MD  Hospital Days:0    Assessment:     Patient Active Problem List    Diagnosis Date Noted   . Left sided numbness 04/15/2019   . Chest discomfort 03/15/2019   . SOB (shortness of breath) 03/15/2019   . Right sided numbness 11/13/2018   . Hypothyroid 04/03/2018   . Mixed hyperlipidemia 03/15/2018   . Hypertension 01/19/2018   . Malignant neoplasm of overlapping sites of right female breast 10/19/2015           Plan:   COVID-19 not detected   [place] Isolation precautions   Serial Trop levels normal   Patient pending Cardiac stress test  PRN oxygen supplementation   Abnormal CXR, obtain procalcitonin level   PRN Bronchodilators   Antiplatelet agent   Fasting lipid panel   Resume statin   Check A1c level   Blood pressure monitoring and control   Hypothyroidism on synthroid   PRN Morphine IV for pain control   Stress ulcer prophylaxis  DVT prophylaxis   Neurology evaluation and recommendation noted    Cardiology noted for scheduled Lexiscan tomorrow   Nutritional support   Code status: Full code          CC: chest pain free         Interval History/24 hour events:   No fever or chills   COVID-19 negative          Review of Systems:   General ROS: negative for - chills,  fever  Psychological ROS: negative for - anxiety , depression  Ophthalmic ROS: negative for - blurry vision or eye pain  ENT ROS: negative for - nasal congestion or oral lesions  Allergy and Immunology ROS: negative for - hives  Hematological and Lymphatic ROS: negative for - bleeding problems  Endocrine ROS: negative for - temperature intolerance  Respiratory ROS: no cough, shortness of breath, or wheezing  Cardiovascular ROS: no chest pain or dyspnea on exertion  Gastrointestinal ROS: no abdominal pain, change in bowel habits, or black or bloody stools  Genito-Urinary ROS: no dysuria, trouble voiding, or hematuria  Musculoskeletal ROS:  negative for - joint pain or joint swelling  Neurological ROS: negative for - numbness/tingling or weakness  Dermatological ROS: negative for rash      Physical Exam:     Patient Vitals for the past 24 hrs:   BP Temp Temp src Pulse Resp SpO2 Height Weight   04/16/19 1621 104/65 98.2 F (36.8 C) Oral (!) 56 16 99 % -- --   04/16/19 1154 136/66 98.1 F (36.7 C) Oral (!) 59 16 99 % -- --   04/16/19 0816 142/65 98.1 F (36.7 C) Oral (!) 55 16 100 % -- --   04/16/19 0404 127/61 97.7 F (36.5 C) Oral 60 17 100 % -- --   04/16/19 0404 -- -- -- 60 -- -- -- --   04/16/19 0403 -- -- -- 65 -- -- -- --   04/16/19 0006 122/76 98.1 F (36.7 C) Oral (!) 59 18 99 % -- --   04/15/19 2048 -- -- -- -- -- -- -- 65.5 kg (144 lb 8 oz)   04/15/19 2002 137/71 98.4 F (36.9 C) Oral 69 17 99 % -- --   04/15/19 1855 159/75 97.9 F (36.6 C) Oral 69 15 99 % -- --   04/15/19 1854 -- -- -- -- -- --  1.6 m (5\' 3" ) 66.4 kg (146 lb 6.4 oz)     Body mass index is 25.6 kg/m.    Intake/Output Summary (Last 24 hours) at 04/16/2019 1850  Last data filed at 04/15/2019 1950  Gross per 24 hour   Intake 400 ml   Output --   Net 400 ml       General: awake, alert, oriented x 3; no acute distress.  HEENT: perrla, eomi, sclera anicteric  oropharynx clear without lesions, mucous membranes moist  Neck: supple, no lymphadenopathy, no thyromegaly, no JVD, no carotid bruits  Cardiovascular: regular rate and rhythm, no murmurs, rubs or gallops  Lungs: clear to auscultation bilaterally, without wheezing, rhonchi, or rales  Abdomen: soft, non-tender, non-distended; no palpable masses, no hepatosplenomegaly, normoactive bowel sounds, no rebound or guarding  Extremities: no clubbing, cyanosis, or edema  Neuro: A+O x 3, cranial nerves grossly intact, strength 5/5 in upper and lower extremities, sensation intact,   Skin: no rashes or lesions noted  Meds:     Medications were reviewed:    Labs:     Recent Labs     04/15/19  1308   WBC 7.45   Hgb 12.4   Hematocrit 38.9    Platelets 353*   MCV 82.6       Recent Labs     04/15/19  1308   Sodium 144   Potassium 3.9   Chloride 101   CO2 30*   BUN 12   Creatinine 1.0   Glucose 111*   Calcium 9.4       Recent Labs     04/15/19  1308   AST (SGOT) 20   ALT 12   Alkaline Phosphatase 100   Protein, Total 7.2   Albumin 4.0       Recent Labs     04/15/19  1308   PTT 33   PT 12.5*   PT INR 0.9       Imaging personally reviewed           Discharge Date: Guarded         Reason to Stay: Chest pain        Case discussed with: Staff nurse and patient    Mosie Epstein, MD  04/16/2019  6:50 PM

## 2019-04-17 ENCOUNTER — Observation Stay (HOSPITAL_BASED_OUTPATIENT_CLINIC_OR_DEPARTMENT_OTHER): Payer: No Typology Code available for payment source

## 2019-04-17 DIAGNOSIS — I5030 Unspecified diastolic (congestive) heart failure: Secondary | ICD-10-CM

## 2019-04-17 DIAGNOSIS — R0789 Other chest pain: Secondary | ICD-10-CM

## 2019-04-17 MED ORDER — ROSUVASTATIN CALCIUM 40 MG PO TABS
20.0000 mg | ORAL_TABLET | Freq: Every day | ORAL | 0 refills | Status: DC
Start: 2019-04-17 — End: 2020-07-14

## 2019-04-17 MED ORDER — LISINOPRIL 20 MG PO TABS
20.0000 mg | ORAL_TABLET | Freq: Two times a day (BID) | ORAL | 3 refills | Status: DC
Start: 2019-04-17 — End: 2020-04-30

## 2019-04-17 MED ORDER — TECHNETIUM TC 99M TETROFOSMIN IV KIT
10.00 | PACK | Freq: Once | INTRAVENOUS | Status: AC | PRN
Start: 2019-04-17 — End: 2019-04-17
  Administered 2019-04-17: 10 via INTRAVENOUS
  Filled 2019-04-17: qty 100

## 2019-04-17 MED ORDER — REGADENOSON 0.4 MG/5ML IV SOLN
INTRAVENOUS | Status: AC
Start: 2019-04-17 — End: 2019-04-17
  Administered 2019-04-17: 0.4 mg via INTRAVENOUS
  Filled 2019-04-17: qty 5

## 2019-04-17 MED ORDER — TECHNETIUM TC 99M TETROFOSMIN IV KIT
35.00 | PACK | Freq: Once | INTRAVENOUS | Status: AC | PRN
Start: 2019-04-17 — End: 2019-04-17
  Administered 2019-04-17: 35 via INTRAVENOUS
  Filled 2019-04-17: qty 100

## 2019-04-17 NOTE — Discharge Summary (Addendum)
DISCHARGE SUMMARY      Date Time: 04/17/19 4:53 PM  Patient Name: Emily [name]  Attending Physician: Mosie Epstein, MD  Primary Care Physician: Skeet Simmer, MD    Date of Admission:   04/15/2019    Date of Discharge:   04/17/2019    Discharge Dx:   Active Problems:    Malignant neoplasm of overlapping sites of right female breast    Hypertension    Mixed hyperlipidemia    Hypothyroid    Right sided numbness    Chest discomfort    SOB (shortness of breath)    Left sided numbness  Resolved Problems:    * No resolved hospital problems. *      Consultations:   Treatment Team: Attending Provider: Mosie Epstein, MD; Consulting Physician: Mosie Epstein, MD; Consulting Physician: Royann Shivers, MD; Case Manager: Mallie Darting, RN; Registered Nurse: Adrienne Mocha, RN; Technician: Kyung Rudd     Procedures/Radiology performed:     CBC  Recent Labs   Lab 04/15/19  1308   WBC 7.45   Hgb 12.4   Hematocrit 38.9   Platelets 353*       CMP  Recent Labs   Lab 04/15/19  1308   Sodium 144   Potassium 3.9   Chloride 101   CO2 30*   BUN 12   Creatinine 1.0   Glucose 111*   Calcium 9.4   Protein, Total 7.2   Albumin 4.0   AST (SGOT) 20   ALT 12   Alkaline Phosphatase 100   Bilirubin, Total 0.3       Lipid panel  Recent Labs   Lab 04/16/19  0554   Cholesterol 186   Triglycerides 75   HDL 60   LDL Calculated 111*       Recent Labs   Lab 04/16/19  0554   Hemoglobin A1C 5.5       Cardiac enzymes  Recent Labs   Lab 04/16/19  0554 04/15/19  1626 04/15/19  1308   Troponin I <0.01 <0.01 <0.01       Echocardiogram Adult Complete W Clr/ Dopp Waveform    Result Date: 04/17/2019  Name:     FRANKIE ZITO Age:     38 years DOB:     [birth year] Gender:     Female MRN:     [id number] Wt:     144 lb BSA:     1.72 m2 Technical Quality:     Technically limited Exam Date/Time:     04/17/2019 9:03 AM Exam Type:     ECHOCARDIOGRAM ADULT COMPLETE W CLR/ DOPP WAVEFORM Staff Sonographer:     AB\S\\S\\S\\S\ Ordering Physician:     Mosie Epstein Study Info Indications      - chest discomfort and previous sob Procedure   Complete two-dimensional, color flow and spectral Doppler transthoracic echocardiogram is performed. 63 in Summary   * The left ventricle is small.   * Left ventricular wall thickness is mildly increased.   * Left ventricular ejection fraction is normal with an estimated ejection fraction of 60-65%.   * Left ventricular diastolic filling parameters are consistent with Grade I diastolic dysfunction (impaired relaxation pattern).   * The right ventricular cavity size is normal in size.   * Normal right ventricular systolic function. TAPSE 1.75.   * No significant valvular abnormalities. Findings Left Ventricle   The left ventricle is small.   Left ventricular wall thickness is  mildly increased.   Left ventricular ejection fraction is normal with an estimated ejection fraction of 60-65%.   Left ventricular segmental wall motion is grossly normal, but subtle wall motion abnormalities cannot be excluded as technically limited study.   Left ventricular diastolic filling parameters are consistent with Grade I diastolic dysfunction (impaired relaxation pattern). Right Ventricle   The right ventricular cavity size is normal in size.   Normal right ventricular systolic function. TAPSE 1.75. Left Atrium   The left atrium is normal in size. Right Atrium   The right atrium is normal in size. Atrial Septum   No evidence of interatrial shunt by color Doppler. Aortic Valve   The aortic valve is not well visualized.   There is no aortic stenosis by doppler data.   There is no aortic regurgitation. Pulmonary Valve   The pulmonic valve is not well visualized.   There is no pulmonic regurgitation. Mitral Valve   The mitral valve is structurally normal.   There is no mitral regurgitation.   Posterior mitral annular calcification is present. Tricuspid Valve   The tricuspid valve is normal.   There is mild tricuspid regurgitation. Pericardium / Pleural Effusion    No pericardial effusion visualized. Inferior Vena Cava   IVC is small and collapsible with estimated RA pressure of 0. Aorta   The aortic root is normal in size.   The ascending aorta is not well visualized. Measurements 2D Measurements ---------------------------------------------------------------------- Name                                 Value        Normal ---------------------------------------------------------------------- Parasternal 2D ---------------------------------------------------------------------- RVID Diastole (2D)                 1.99 cm     2.50-3.50 IVS Diastolic Thickness (2D)       4.40 cm     0.60-0.90 LVID Diastole (2D)                 3.77 cm     3.80-5.20 LVIW Diastolic Thickness (2D)                               1.05 cm     0.60-0.95 LVID Systole (2D)                  1.94 cm     2.20-3.50 LV Ejection Fraction 2D ---------------------------------------------------------------------- LV EF (2D Teicholz)                   60 %         54-74 Apical 2D Dimensions ---------------------------------------------------------------------- RA Area (4C)                     12.30 cm2       <=18.00 M-mode Measurements ---------------------------------------------------------------------- Name                                 Value        Normal ---------------------------------------------------------------------- M-Mode ---------------------------------------------------------------------- Ao Root Diameter (MM)              2.90 cm               LA Dimension (MM)  3.70 cm     2.70-3.80 AV Cusp Sep (MM)                   1.80 cm               TAPSE                              1.75 cm        >=1.60 LVOT/Aortic Valve Doppler Measurements ---------------------------------------------------------------------- Name                                 Value        Normal ---------------------------------------------------------------------- LVOT Doppler  ---------------------------------------------------------------------- LVOT Peak Velocity                0.94 m/s               AoV Doppler ---------------------------------------------------------------------- AV Peak Velocity                  1.33 m/s               AV V1/V2 Ratio                        0.71 Mitral Valve Measurements ---------------------------------------------------------------------- Name                                 Value        Normal ---------------------------------------------------------------------- MV Doppler ---------------------------------------------------------------------- MV E Peak Velocity                0.93 m/s               MV A Peak Velocity                1.18 m/s               MV E/A                                0.79               MV Decel Time                       324 ms               MV Annular TDI ---------------------------------------------------------------------- MV Septal e' Velocity             0.05 m/s        >=0.08 MV E/e' (Septal)                     18.22        <=8.00 MV Lateral e' Velocity            0.08 m/s        >=0.10 MV E/e' (Lateral)                    12.06        <=8.00 Tricuspid Valve Measurements ---------------------------------------------------------------------- Name  Value        Normal ---------------------------------------------------------------------- TV Doppler ---------------------------------------------------------------------- TV E Peak Velocity                0.80 m/s               TV A Peak Velocity                0.87 m/s               TV E/A                           0.92 cm/s     0.80-2.00 TV Regurgitation Doppler ---------------------------------------------------------------------- TR Peak Velocity                  2.81 m/s               RA Pressure                         3 mmHg           <=3 RV Systolic Pressure               35 mmHg           <36 Pulmonary Vein Measurements  ---------------------------------------------------------------------- Name                                 Value        Normal ---------------------------------------------------------------------- Pulmonary Vein ---------------------------------------------------------------------- Pulm Vein Peak Systolic Velocity                          0.40 m/s               Pulm Vein Peak Diastolic Velocity                          0.37 m/s               Pulm Vein S/D Velocity Ratio          1.07 Report Signatures Finalized by Bari Edward  MD on 04/17/2019 12:08 PM Promoted by Devota Pace on 04/17/2019 10:17 AM    Ct Angiogram Head Neck    Result Date: 04/15/2019  CT ANGIOGRAM HEAD NECK CLINICAL INDICATION:   Intermittent left face arm and leg numbness and weakness sensation ongoing since yesterday rule out high-grade vascular stenosis TECHNIQUE:Serial axial images through the neck and head during intravenous administration of 100 mL Omnipaque 350 contrast with coronal, sagittal, axial and oblique reconstructions including MIP reconstructions Note that CT scanning at this site utilizes multiple dose reduction techniques including automatic exposure control, adjustment of the MAA and/or KVP according to the patient's size and use of iterative reconstruction technique. COMPARISON: CT angiogram from 05/14/2018 FINDINGS: CT angiogram head: There is no mass effect or midline shift. Lateral ventricles are normal in size and configuration. Basilar cisterns are patent. The vertebrobasilar system is patent. Posterior cerebral arteries are patent bilaterally. The distal internal carotid arteries, middle cerebral arteries and anterior cerebral arteries are patent bilaterally. No major branch occlusion is identified. No acute osseous abnormality is identified. CT angiogram neck: Orbits appear normal and symmetric. Paranasal sinuses and mastoid air cells are well aerated. There are degenerative changes spine. There is a hypodense  focus  in the anterior aspect of the right lobe of the thyroid gland that measures approximately 15 mm. This appears similar to the prior study. Atherosclerotic changes are noted in the common carotid artery bifurcations. There is not evidence of flow-limiting stenosis in the common carotid arteries or internal carotid arteries. Vertebral arteries are patent bilaterally. There is not evidence of flow-limiting stenosis.     1.  No major branch occlusion is seen in the CT angiogram of the head. 2.  There is not evidence of flow-limiting stenosis in the internal carotid arteries. Tana Felts, MD  04/15/2019 7:02 PM    Ct Head Wo Contrast    Result Date: 04/15/2019  CT HEAD WO CONTRAST CLINICAL INDICATION:   Stroke, follow up COMPARISON: 11/13/2018 TECHNIQUE: 5 mm axial images from the skull base to the vertex. The following ?dose reduction techniques were utilized: automated exposure control and/or adjustment of the mA and/or kV according to patient size, and the use of iterative reconstruction technique. FINDINGS: The ventricles, cisterns, and sulci appear within normal size limits. There is no mass effect or midline shift. There is no hemorrhage or abnormal extra-axial fluid collection. The gray-white differentiation appears maintained. Ill-defined white matter hypodensities are nonspecific but likely represent microvascular ischemic changes.  Bone windows demonstrate no evidence for acute osseous abnormality. The included paranasal sinuses and mastoid air cells appear clear.      No acute intracranial process. Sandie Ano, MD  04/15/2019 2:33 PM    Mri Brain W Wo Contrast    Result Date: 04/16/2019  MRI BRAIN W WO CONTRAST CLINICAL HISTORY:Episode left sided weakness numbness COMPARISON: 11/13/2018 TECHNIQUE: Multiplanar multisequence imaging performed. Following 6.5 cc Gadavist multiplanar T1-weighted images were obtained. Thin section gradient echo sequence performed through the entire brain. Multiplanar reconstructions  were reformatted from the thin section data set. INTERPRETATION: Mild generalized parenchymal volume loss noted. The ventricles are normal in size and contour for age. Multiple small foci are present in the subcortical and periventricular white matter bilaterally. No area of restricted diffusion identified. No acute hemorrhage, mass lesion or mass effect. The brainstem,  the cerebellum, are within normal limits The craniocervical junction is within normal limits. The contrast enhancement pattern is normal.     Stable mild small vessel white matter changes. No superimposed acute process. Specifically there is no acute cortical infarct, mass or evidence of hemorrhage.  Heron Nay, MD  04/16/2019 10:08 PM    Mri Cervical Spine W Wo Contrast    Result Date: 04/17/2019  MRI CERVICAL SPINE: INDICATION: left numbness TECHNIQUE: Multisequence, multiplanar MRI of the cervical spine was performed before and after 6.5 mL Gadavist intravenous contrast administration.  Obtained sequences include sagittal T1, sagittal T2, sagittal STIR, and axial T2 weighted.  COMPARISON: 05/26/2017 FINDINGS: Straightening of the cervical spine is stable. Vertebral body heights maintained. Overall stable degenerative change of the lumbar spine with multilevel disc height loss at C3-C4 and through C6-C7. No cord signal abnormality demonstrated. No abnormal enhancement demonstrated within the central canal. No suspicious marrow signal abnormality is present. Degenerative type I Modic endplate changes noted at C4-C5 and C6-C7. Stable area of small cystic change noted within the right anterior arch of C1 and within the body of the dens anteriorly. C2-C3: No disc bulge/protrusion. No significant neuroforaminal narrowing or central canal stenosis. C3-C4: Disc osteophyte complex, thickening of the posterior ligamentum and uncovertebral joint hypertrophy with moderate central canal stenosis and moderate to severe bilateral neuroforaminal narrowing.  C4-C5: Disc osteophyte complex, thickening  of the posterior ligamentum and uncovertebral joint hypertrophy with moderate central canal stenosis and moderate to severe bilateral neuroforaminal narrowing. C5-C6: Disc osteophyte complex, thickening of the posterior ligamentum and uncovertebral joint hypertrophy with moderate central canal stenosis and moderate to severe bilateral neuroforaminal narrowing. C6-C7: Disc osteophyte complex, thickening of the posterior ligamentum and uncovertebral joint hypertrophy with mild to moderate central canal stenosis and moderate bilateral neuroforaminal narrowing. C7-T1: No disc bulge/protrusion. No significant neuroforaminal narrowing or central canal stenosis.      No acute findings. Overall stable degenerative change of the cervical spine associated with multilevel neuroforaminal stenosis and central canal narrowing as above. Gustavus Messing, MD  04/17/2019 7:13 AM    Nm Myocardial Perfusion Spect (stress And Rest)    Result Date: 04/17/2019  Patient Info Name:     JOLISSA KAPRAL Age:     66 years DOB:     29-Apr-1946 Gender:     Female MRN:     [id number] Ht:     160 cm Wt:     65 kg BSA:     1.72 m2 Exam Date:     04/17/2019 9:28 AM Exam Location:     [facility] [place] Staff Ordering Physician:     Mosie Epstein Nuclear Technologist:     Devota Pace, RDCS Exam Type:     NM MYOCARDIAL PERFUSION SPECT (STRESS AND REST) Study Info Indications      - chest discomfort ,  recurrent Summary   1. Normal stress and rest myocardial perfusion study without evidence of inducible ischemia or scar.   2. Normal left ventricular function with calculated LVEF 77 %.   3. No prior studies are available for comparison. Stress ECG Details Protocol:     Regadenoson Rest HR:     54 bpm Peak HR:     100 bpm Rest Sys BP:     122 mmHg Peak Sys BP:     121 mmHg Max Pred HR:     147 bpm % Max Pred HR:     68 % Max RPP:     12,100 bpm*mmHg BP Response:     Normal blood pressure response Termination Reason:      Protocol Completed, Protocol Completed Cardiac Symptoms:     Chest discomfort Total Time:     1 min : 36 sec Rest Dias BP:     62 mmHg Peak Dias BP:     60 mmHg Resting ECG:     Normal sinus rhythm Stress ECG:     No significant ST changes from baseline to suggest pharmacological stressinduced myocardial ischemia Arrhythmias:     No arrhythmias ------------------------------ Stage:     PREINFSN SUPINE Duration (min):     23 min : 49 sec HR (bpm):     55 SBP (mmHg):     122 DBP (mmHg):     62 ------------------------------ Stage:     INFUSION DOSE 1 Duration (min):     1 min : 36 sec HR (bpm):     100 SBP (mmHg):     --- DBP (mmHg):     --- ------------------------------ Stage:     POSTINFSN Duration (min):     2 min : 0 sec HR (bpm):     93 SBP (mmHg):     --- DBP (mmHg):     --- ------------------------------ Stage:     POSTINFSN Duration (min):     2 min : 0 sec HR (bpm):  78 SBP (mmHg):     121 DBP (mmHg):     60 ------------------------------ Stage:     POSTINFSN Duration (min):     0 min : 18 sec HR (bpm):     78 SBP (mmHg):     121 DBP (mmHg):     60 Dose:     0.4mg  regadenoson given 30 seconds prior to stress radionuclide. Duration: 15s Administration Site:     IV - left wrist Radiopharmaceutical:     Tc-53m Tetrofosmin Administration Site:     IV - left wrist Administered By:     Denton Brick CNMT Radiopharmaceutical:     Tc20mTetrofosmin Administration Site:     IV - left wrist Administered By:     Denton Brick CNMT Image Protocol Protocol:     Rest/Stress 1 Day Rest Radiopharmaceutical Dose:     10.5 mCi Stress Radiopharmaceutical Dose:     36.0 mCi Injection to Imaging Time:     45 min Injection to Imaging Time:     90 min SPECT Results Perfusion Findings Stress and rest SPECT tomographic images reveal a normal perfusion pattern with no significant defects or areas of reversibility seen. Technical Quality:     Excellent Image Artifacts:     None Image Corrections:     Attenuation correction applied - CT  based Perfusion Quantitative Results Stress Extent Global Stress Extent     4 % Rest Extent LCX Rest Extent:     0 % Global Rest Extent:     1 % Ischemia Extent Global Ischemia Extent:     2 % Functional Results ---------------------------------------------------------------------- Name                                 Value        Normal ---------------------------------------------------------------------- Stress ---------------------------------------------------------------------- Stress LV EF                          77 %          >=55 Functional Findings   Gated wall motion study demonstrates normal left ventricular thickening and wall motion with calculated ejection fraction 77 %. Report Signatures MPI SPECT Finalized by Bari Edward  MD on 04/17/2019 01:58 PM Stress Finalized by Bari Edward  MD on 04/17/2019 01:58 PM    Chest Ap Portable    Result Date: 04/15/2019  History: chest pain Technique: Single Portable View Comparison: 01/11/2019 Findings: Mild airspace disease is seen in the right midlung zone There is no pneumothorax. The heart is normal in size.    The mediastinum is within normal limits.      Mild increase in the degree of airspace disease in right mid lung zone may represent atelectasis or pneumonitis Laurena Slimmer, MD  04/15/2019 1:16 PM    Mammo Diagnostic W/tomo Left    Result Date: 03/25/2019  CLINICAL INDICATION: Left breast pain at 11:00. History of right mastectomy with pain at the scar area. BREAST PARENCHYMAL PATTERN: Scattered fibroglandular tissue which can obscure a lesion (25-50% glandular tissue). FINDINGS: Digital mammography was performed. CC and MLO views of the left breast was obtained.. Comparison made to studies of 2019 and 2018. Vascular calcifications. Left intramammary lymph node confirmed with tomography in the mid breast. No change in the breast parenchymal pattern. No skin thickening, nipple retraction, or malignant type calcification. No tomographic evidence of  malignancy.  No mammographic evidence of malignancy or significant change from the prior exam. A second reading of this mammogram was provided with computer assisted diagnosis. No additional findings requiring imaging were noted. BI-RADS: Bi-rads category 0: Needs additional imaging evaluation. RECOMMENDATION: Right breast ultrasound at the mastectomy scar tenderness site. Left breast ultrasound at the site of pain. Ultrasound was performed. Kinnie Feil, MD  03/25/2019 11:32 AM    US Breast Bilateral Ltd    Result Date: 03/25/2019  Indication: Bilateral breast pain at the right mastectomy stomach are and in the left breast between 9 and 12:00. FINDINGS: The right mastectomy scar was imaged. No cystic or solid mass. The left breast was imaged from 9:00 through 12:00. No ultrasound evidence of a cystic or solid mass. The axilla were imaged bilaterally. There is a benign lymph node in the left axilla measuring 1.4 x 0.8 x 0.4 cm with central fatty change.      Benign-appearing lymph node in the left axilla. No evidence of left breast mass at the site of pain. No evidence of mass or collection at the right mastectomy scar site of pain. BI-RADS Category 1: Negative. Recommendation: Routine screening mammography. The patient was entered into computerized tracking system for mammogram reminder Kinnie Feil, MD  03/25/2019 11:12 AM       Hospital Course:   NYA MONDS is a 73 y.o. female who presents to the hospital with above-stated complaints of worsening generalized weakness, left facial numbness and tingling. Patient claimed symptoms had been ongoing for the past few days with associated chest discomfort leading to the ER visit. Patient evaluated to be weak but no focal neurological deficit, a state head CT negative for acute intracranial pathology. Chest X-ray noted for mild increase in the degree of airspace disease in the right mid lung zone possibly due to atelectasis verse pneumonitis. Biochemical profile  noted to be normal. PUI protocol was initiated due to prevalence of COVID-19 infection with presumed close exposure patient admitted on the stroke pathway under PUI protocol and further evaluation by Neurology and Cardiology. Serial cardiac enzymes were negative. Various biophysical profile negative for acute intracranial pathology. Stress test done after negative COVID results reported to be normal. Patient cleared by both cardiology and neurology to be discharged home with follow up appointment with PCP as scheduled.      Discharge Medications:        Medication List      CHANGE how you take these medications    anastrozole 1 MG tablet  Commonly known as:  ARIMIDEX  Take 1 tablet (1 mg total) by mouth daily  What changed:  when to take this     rosuvastatin 40 MG tablet  Commonly known as:  CRESTOR  Take 0.5 tablets (20 mg total) by mouth daily  What changed:  how much to take        CONTINUE taking these medications    amLODIPine 5 MG tablet  Commonly known as:  NORVASC  Take 1 tablet (5 mg total) by mouth daily     aspirin 81 MG EC tablet     levothyroxine 25 MCG tablet  Commonly known as:  SYNTHROID     lidocaine 5 %  Commonly known as:  LIDODERM  Place 1 patch onto the skin every 12 (twelve) hours as needed (pain) Remove & Discard patch within 12 hours or as directed by MD     lisinopril 20 MG tablet  Commonly known as:  ZESTRIL  Take 1  tablet (20 mg total) by mouth 2 (two) times daily     multivitamin with minerals tablet     simvastatin 40 MG tablet  Commonly known as:  ZOCOR  TAKE 1 TABLET BY MOUTH ONCE DAILY IN THE MORNING     vitamin B-1 100 MG tablet  Commonly known as:  THIAMINE           Where to Get Your Medications      These medications were sent to [facility] [address] [place] - [address]  [address], [place] [address]    Phone:  [phone]    lisinopril 20 MG tablet   rosuvastatin 40 MG tablet           Discharge Instructions:       Disposition:   Home      Patient was instructed to follow up with Primary Care Doctor Abran Cantor, Dema Severin, MD in 2 weeks     Minutes spent coordinating discharge and reviewing discharge plan:35 minutes      Signed by: Mosie Epstein, MD    CC: Skeet Simmer, MD

## 2019-04-17 NOTE — Progress Notes (Signed)
CARDIOLOGY HOSPITAL NOTE  Date:  04/17/2019  11:33 AM    Nolon Yellin Alyson Locket, MD [facility] Cardiology  Office phone:  [phone]     [facility] phone [phone]  [facility] phone [phone]    Patient:  Emily [name].  DOB: 21-Mar-1946.  73 y.o.  female  Date of Admission:  04/15/2019  Attending:  Mosie Epstein, MD     ASSESSMENT & PLAN:  ###   73 y.o. female presented 04/15/2019 left arm/leg symptoms and CP    DIAGNOSES:  *chest pain --- await lexiscan nuc study.  Can be d/c'ed from our standpoint if normal  *possible CVA  --- appreciate neurology's not  *HTN  *HLD    HISTORY OF PRESENT ILLNESS / SUBJECTIVE:  ###   No CP or SOB.  Eager to go home    DIAGNOSTICS:                                               PAST MEDICAL HISTORY:  ###     Past Medical History:   Diagnosis Date   . Breast cancer 2014    right breast mastectomy   . GERD (gastroesophageal reflux disease)    . Hyperlipidemia    . Hypertension    . Hypothyroidism    . Malignant neoplasm of overlapping sites of right female breast 10/19/2015   . Vertigo      SOCIAL HISTORY:   Never smoker    PHYSICAL EXAM:  ###                                                 BP 119/69   Pulse (!) 55   Temp 97.1 F (36.2 C) (Oral)   Resp 20   Ht 1.6 m (5\' 3" )   Wt 65.5 kg (144 lb 8 oz)   SpO2 99%   BMI 25.60 kg/m    No intake or output data in the 24 hours ending 04/17/19 1133  >> General:   no distress,  >> Respiratory:   no crackles,  >> Cardiovascular:   regular rhythm,  No LE edema,  >> Neuro/Psych:   Alert,  Appropriate mood and affect,      LABORATORY:  ###     CBC w/Diff   Recent Labs   Lab 04/15/19  1308   WBC 7.45   Hgb 12.4   Hematocrit 38.9   Platelets 353*        Basic Metabolic Profile   Recent Labs   Lab 04/15/19  1308   Sodium 144   Potassium 3.9   Chloride 101   CO2 30*   BUN 12   Creatinine 1.0   EGFR >60.0   Glucose 111*   Calcium 9.4        Cardiac Enzymes   Recent Labs   Lab 04/16/19  0554 04/15/19  1626 04/15/19  1308    Troponin I <0.01 <0.01 <0.01            Coagulation Studies   Recent Labs   Lab 04/15/19  1308   PT 12.5*   PT INR 0.9   PTT 33        MEDICATIONS:  ###  INFUSION MEDS:      SCHEDULED MEDS:     Current Facility-Administered Medications   Medication Dose Route Frequency   . regadenoson       . amLODIPine  5 mg Oral Daily   . anastrozole  1 mg Oral Daily   . aspirin  81 mg Oral QAM   . levothyroxine  25 mcg Oral QAM   . lisinopril  20 mg Oral BID   . rosuvastatin  40 mg Oral Daily   . thiamine  100 mg Oral QAM   . vitamins/minerals  1 tablet Oral QAM      PRN MEDS:   acetaminophen, hydrALAZINE, morphine, ondansetron, Tc67m tetrofosmin    MISCELLANEOUS:  ###

## 2019-04-17 NOTE — Plan of Care (Signed)
Patient initially on stroke pathway, Neuro d/c patient from pathway. Patient is A&Ox4, call bell within reach, walks without assistance, nonskid socks. Patient ate/drank at least 50%. Patient denies pain and dizziness. Patient pleased with test results. Patient to be discharged this evening to home. VS WNL, medications administered as prescribed.   Problem: Safety  Goal: Patient will be free from injury during hospitalization  Outcome: Progressing     Problem: Discharge Barriers  Goal: Patient will be discharged home or other facility with appropriate resources  Outcome: Progressing     Problem: Moderate/High Fall Risk Score >5  Goal: Patient will remain free of falls  Outcome: Progressing     Problem: Every Day - Stroke  Goal: Core/Quality measure requirements - Daily  Outcome: Progressing  Goal: Neurological status is stable or improving  Outcome: Progressing  Goal: Stable vital signs and fluid balance  Outcome: Progressing  Goal: Mobility/Activity is maintained at optimal level for patient  Outcome: Progressing     Problem: Hemodynamic Status: Cardiac  Goal: Stable vital signs and fluid balance  Outcome: Progressing

## 2019-04-17 NOTE — PT Plan of Care Note (Signed)
PT attempted PT twice today. Initially pt was on phone with family and requested PT come back. Upon return pt insisting she is 'too tired' due to 'many tests today' and repeatedly declining any intervention. Pt stating tomorrow she might feel like walking. PT will reattempt.  Leanord Hawking, PT   04/17/2019  1:59 PM

## 2019-04-17 NOTE — Progress Notes (Addendum)
Date Time: 04/17/19 1:20 PM  Patient Name: Emily [name]  Attending Physician: Mosie Epstein, MD  Patient Class: Observation  Hospital Day: 0            NEUROLOGY PROGRESS NOTE             Assessment/Plan   Intermittent left face arm and leg numbness tingling and weakness, --- testing again negative, this was looking at third visit for the patient with symptoms and negative evaluation?  Stress response?  Psychogenic not entirely sure at this time  Prior history of high blood pressure  High lipids  History of breast cancer    CT angios head and neck was negative brain MRI negative for stroke C-spine shows degenerative disc disease but no cord compression but even if it is an etiology of her symptoms, does not explain facial paresthesias      Given negative testing okay for discharge  Follow-up with [place] in the office so we can try to avoid further hospitalizations--- information handed personally to the patient  Patient on aspirin and statin  EMG nerve conduction study as outpatient    Subjective:   Patient Seen and Examined.  Feels better now no further events reported    Review of Systems:   No headache, eye, ear nose, throat problems; no coughing or wheezing or shortness of breath, No chest pain or orthopnea, no abdominal pain, nausea or vomiting, No pain in the body or extremities, no psychiatric, neurological, endocrine, hematological or cardiac complaints except as noted above.     Physical Exam:   BP 150/81   Pulse (!) 55   Temp 98.1 F (36.7 C) (Oral)   Resp 20   Ht 1.6 m (5\' 3" )   Wt 65.5 kg (144 lb 8 oz)   SpO2 99%   BMI 25.60 kg/m     Awake alert oriented x3 speech fluent smile symmetric moving both arms and legs well  Light touch temperature intact bilaterally  Meds:      Scheduled Meds: PRN Meds:    amLODIPine, 5 mg, Oral, Daily  anastrozole, 1 mg, Oral, Daily  aspirin, 81 mg, Oral, QAM  levothyroxine, 25 mcg, Oral, QAM  lisinopril, 20 mg, Oral, BID  rosuvastatin, 40 mg, Oral, Daily  thiamine, 100  mg, Oral, QAM  vitamins/minerals, 1 tablet, Oral, QAM        Continuous Infusions:   acetaminophen, 650 mg, Q6H PRN  hydrALAZINE, 10 mg, Q6H PRN  morphine, 2 mg, Q4H PRN  ondansetron, 4 mg, 4X Daily PRN                Labs:     Recent Labs   Lab 04/15/19  1308   Glucose 111*   BUN 12   Creatinine 1.0   Calcium 9.4   Sodium 144   Potassium 3.9   Chloride 101   CO2 30*   Albumin 4.0   AST (SGOT) 20   ALT 12   Bilirubin, Total 0.3   Alkaline Phosphatase 100     Recent Labs   Lab 04/15/19  1308   WBC 7.45   Hgb 12.4   Hematocrit 38.9   MCV 82.6   MCH 26.3   MCHC 31.9   Platelets 353*         Recent Labs     04/15/19  1308   PTT 33   PT 12.5*   PT INR 0.9          Radiology Results (  24 Hour)     Procedure Component Value Units Date/Time    NM Myocardial Perfusion Spect (Stress And Rest) [[id number]] Resulted:  04/17/19 0905    Order Status:  Sent Updated:  04/17/19 1257    MRI Cervical Spine W WO Contrast [[id number]] Collected:  04/17/19 0705    Order Status:  Completed Updated:  04/17/19 0715    Narrative:       MRI CERVICAL SPINE:     INDICATION: left numbness    TECHNIQUE: Multisequence, multiplanar MRI of the cervical spine was  performed before and after 6.5 mL Gadavist intravenous contrast  administration.  Obtained sequences include sagittal T1, sagittal T2,  sagittal STIR, and axial T2 weighted.      COMPARISON: 05/26/2017     FINDINGS: Straightening of the cervical spine is stable. Vertebral body  heights maintained. Overall stable degenerative change of the lumbar  spine with multilevel disc height loss at C3-C4 and through C6-C7. No  cord signal abnormality demonstrated. No abnormal enhancement  demonstrated within the central canal. No suspicious marrow signal  abnormality is present. Degenerative type I Modic endplate changes noted  at C4-C5 and C6-C7. Stable area of small cystic change noted within the  right anterior arch of C1 and within the body of the dens anteriorly.    C2-C3: No disc bulge/protrusion.  No significant neuroforaminal narrowing  or central canal stenosis.    C3-C4: Disc osteophyte complex, thickening of the posterior ligamentum  and uncovertebral joint hypertrophy with moderate central canal stenosis  and moderate to severe bilateral neuroforaminal narrowing.    C4-C5: Disc osteophyte complex, thickening of the posterior ligamentum  and uncovertebral joint hypertrophy with moderate central canal stenosis  and moderate to severe bilateral neuroforaminal narrowing.    C5-C6: Disc osteophyte complex, thickening of the posterior ligamentum  and uncovertebral joint hypertrophy with moderate central canal stenosis  and moderate to severe bilateral neuroforaminal narrowing.    C6-C7: Disc osteophyte complex, thickening of the posterior ligamentum  and uncovertebral joint hypertrophy with mild to moderate central canal  stenosis and moderate bilateral neuroforaminal narrowing.    C7-T1: No disc bulge/protrusion. No significant neuroforaminal narrowing  or central canal stenosis.      Impression:        No acute findings. Overall stable degenerative change of the  cervical spine associated with multilevel neuroforaminal stenosis and  central canal narrowing as above.    Gustavus Messing, MD   04/17/2019 7:13 AM    MRI Brain W WO Contrast [[id number]] Collected:  04/16/19 2201    Order Status:  Completed Updated:  04/16/19 2210    Narrative:       MRI BRAIN W WO CONTRAST    CLINICAL HISTORY:Episode left sided weakness numbness    COMPARISON: 11/13/2018    TECHNIQUE: Multiplanar multisequence imaging performed. Following 6.5 cc  Gadavist multiplanar T1-weighted images were obtained. Thin section  gradient echo sequence performed through the entire brain. Multiplanar  reconstructions were reformatted from the thin section data set.    INTERPRETATION:  Mild generalized parenchymal volume loss noted. The ventricles are  normal in size and contour for age. Multiple small foci are present in  the subcortical and  periventricular white matter bilaterally. No area of  restricted diffusion identified.     No acute hemorrhage, mass lesion or mass effect.    The brainstem,  the cerebellum, are within normal limits  The craniocervical junction is within normal limits.    The contrast enhancement pattern is  normal.      Impression:         Stable mild small vessel white matter changes. No superimposed acute  process. Specifically there is no acute cortical infarct, mass or  evidence of hemorrhage.       Heron Nay, MD   04/16/2019 10:08 PM           All brain imaging (MRI, CT) personally reviewed.    Case discussed with: pt       This note was generated by the Epic EMR system/ Dragon speech recognition and may contain inherent errors or omissions not intended by the user. Grammatical errors, random word insertions, deletions and pronoun errors  are occasional consequences of this technology due to software limitations.     Not all errors are caught or corrected. If there are questions or concerns about the content of this note or information contained within the body of this dictation they should be addressed directly with the author for clarification.      Signed by: Cathe Mons, MD  Spectralink: [id number]      Answering Service: [phone]

## 2019-04-17 NOTE — Plan of Care (Signed)
The learning abilities of the patient and/or caregiver have been assessed. Today's individualized plan of care includes fall prevention, pain management, neuro check and vital signs every four hours. The patient or caregiver states the following personal goal related to the patient's deficit(s): "By discharge I want to be able to go home without right sided weakness  Problem: Safety  Goal: Patient will be free from injury during hospitalization  Outcome: Progressing  Flowsheets (Taken 04/17/2019 0030)  Patient will be free from injury during hospitalization:   Assess patient's risk for falls and implement fall prevention plan of care per policy   Provide and maintain safe environment   Use appropriate transfer methods   Hourly rounding   Include patient/ family/ care giver in decisions related to safety   Ensure appropriate safety devices are available at the bedside   Assess for patients risk for elopement and implement Elopement Risk Plan per policy     Problem: Pain  Goal: Pain at adequate level as identified by patient  Outcome: Progressing  Note: Pt denies pain at this time     Problem: Every Day - Stroke  Goal: Core/Quality measure requirements - Daily  Outcome: Progressing  Flowsheets (Taken 04/17/2019 0030)  Core/Quality measure requirements - Daily:   Ensure antithrombotic administered or contraindication documented by LIP by end of day 2   VTE Prevention: Ensure anticoagulant(s) administered and/or anti-embolism stockings/devices documented by end of day 2   Continue stroke education (must include Modifiable Risk Factors, Warning Signs and Symptoms of Stroke, Activation of Emergency Medical System and Follow-up Appointments). Ensure handout has been given and documented.  Goal: Neurological status is stable or improving  Outcome: Progressing  Flowsheets (Taken 04/17/2019 0030)  Neurological status is stable or improving:   Monitor/assess NIH Stroke Scale   Re-assess NIH Stroke Scale for any change in status    Observe for seizure activity and initiate seizure precautions if indicated   Perform CAM Assessment   Monitor/assess/document neurological assessment (Stroke: every 4 hours)  Note: Continued neuro check and vital signs every four hours  Goal: Stable vital signs and fluid balance  Outcome: Progressing  Flowsheets (Taken 04/17/2019 0030)  Stable vital signs and fluid balance:   Monitor and assess vitals every 4 hours or as ordered and hemodynamic parameters   Position patient for maximum circulation/cardiac output   Encourage oral fluid intake   Apply telemetry monitor as ordered  Goal: Mobility/Activity is maintained at optimal level for patient  Outcome: Progressing  Flowsheets (Taken 04/17/2019 0030)  Mobility/activity is maintained at optimal level for patient:   Plan activities to conserve energy, plan rest periods   Perform active/passive ROM   Reposition patient every 2 hours and as needed unless able to reposition self   Assess for changes in respiratory status, level of consciousness and/or development of fatigue   Maintain proper body alignment   Encourage independent activity per ability   Increase mobility as tolerated/progressive mobility   ." The plan of care was discussed with the patient and/or caregiver, who agrees to it and demonstrates understanding of the disease process, risk factors, treatment plan, medications and consequences of noncompliance. All questions and concerns were addressed.

## 2019-04-18 ENCOUNTER — Telehealth (INDEPENDENT_AMBULATORY_CARE_PROVIDER_SITE_OTHER): Payer: Self-pay | Admitting: Cardiology

## 2019-07-01 ENCOUNTER — Ambulatory Visit (INDEPENDENT_AMBULATORY_CARE_PROVIDER_SITE_OTHER): Payer: No Typology Code available for payment source | Admitting: Cardiovascular Disease

## 2019-07-01 ENCOUNTER — Encounter (INDEPENDENT_AMBULATORY_CARE_PROVIDER_SITE_OTHER): Payer: Self-pay | Admitting: Cardiovascular Disease

## 2019-07-01 VITALS — BP 139/77 | HR 63 | Temp 98.2°F | Resp 16 | Ht 63.0 in | Wt 153.0 lb

## 2019-07-01 DIAGNOSIS — I10 Essential (primary) hypertension: Secondary | ICD-10-CM

## 2019-07-01 NOTE — Progress Notes (Signed)
Cardiology    Chief Complaint   Patient presents with   . Palpitations   . Chest Pain     feels pain under left breast        Assessment and Plan   Patient presents for f/u.    1. Essential hypertension  73 y.o. female presented 04/15/2019 left arm/leg symptoms and CP    DIAGNOSES:  *chest pain --- echo and Lexiscan were normal in July 2020.  Her symptoms were not exertional.  She reports no change in symptoms now.  Better than during the admission.  No new symptoms.  *HTN  *HLD    Plan:  Monitor symptoms  Reassured; likely stress  If change in character or quality of symptoms, return sooner  Consider peripheral nerve issue (f/u with PCP for further workup)  See Dr. Franchot Erichsen in 3 months.  - ECG 12 lead          History of Present Illness     Patient presents in follow-up.  She was hospitalized in July 2020 at [facility].  She had chest pain.  She was underwent a Lexiscan and echocardiogram.  She has a history of hypertension and hyperlipidemia.    She had surgery at [facility] on right breast (had mastectomy) about 5 years ago.  She reports some memory issues.  Husband died from prostate Ca 2 years ago. She is under stress.    As you know, she presented to the hospital on 04/15/2019 with a history of tingling in left arm and left leg with numbness in the left foot off and on.  No weakness was described.  The patient also had discomfort in the left chest that has come and gone not associated with physical activity.  No syncope near syncope or shortness of breath as part of this hospital stay.  The patient has had history of dyspnea on exertion previously and was going to undergo cardiac evaluation for previously noted chest discomfort and shortness of breath in June 2020.  However the studies have not yet been completed.  There is a history of hypertension and hyperlipidemia.  She underwent Lexiscan and echocardiogram during hospital stay.  She was also evaluated by neurology and underwent extensive  workup.  No clear findings.  Continues to report left arm tingling (not exertional).      Past Medical History     Past Medical History:   Diagnosis Date   . Breast cancer 2014    right breast mastectomy   . GERD (gastroesophageal reflux disease)    . Hyperlipidemia    . Hypertension    . Hypothyroidism    . Malignant neoplasm of overlapping sites of right female breast 10/19/2015   . Vertigo        Past Surgical History     Past Surgical History:   Procedure Laterality Date   . HYSTERECTOMY     . MASTECTOMY Right 2015       Family History     Family History   Problem Relation Age of Onset   . Breast cancer Neg Hx        Social History     Social History     Socioeconomic History   . Marital status: Widowed     Spouse name: Not on file   . Number of children: Not on file   . Years of education: Not on file   . Highest education level: Not on file   Occupational History   .  Not on file   Social Needs   . Financial resource strain: Not on file   . Food insecurity     Worry: Not on file     Inability: Not on file   . Transportation needs     Medical: Not on file     Non-medical: Not on file   Tobacco Use   . Smoking status: Never Smoker   . Smokeless tobacco: Never Used   Substance and Sexual Activity   . Alcohol use: Yes     Alcohol/week: 1.0 standard drinks     Types: 1 Cans of beer per week     Comment: socially   . Drug use: No   . Sexual activity: Not on file   Lifestyle   . Physical activity     Days per week: Not on file     Minutes per session: Not on file   . Stress: Not on file   Relationships   . Social Wellsite geologist on phone: Not on file     Gets together: Not on file     Attends religious service: Not on file     Active member of club or organization: Not on file     Attends meetings of clubs or organizations: Not on file     Relationship status: Not on file   . Intimate partner violence     Fear of current or ex partner: Not on file     Emotionally abused: Not on file     Physically abused: Not on  file     Forced sexual activity: Not on file   Other Topics Concern   . Not on file   Social History Narrative   . Not on file       Allergies     Allergies   Allergen Reactions   . Caffeine      Personal preference   . Morphine Itching   . Morphine And Related Itching and Nausea And Vomiting     dizzy       Medications     Current Outpatient Medications   Medication Sig Dispense Refill   . amLODIPine (NORVASC) 5 MG tablet Take 1 tablet (5 mg total) by mouth daily 90 tablet 3   . anastrozole (ARIMIDEX) 1 MG tablet Take 1 tablet (1 mg total) by mouth daily (Patient taking differently: Take 1 mg by mouth every morning    ) 90 tablet 3   . aspirin 81 MG EC tablet Take 81 mg by mouth every morning         . levothyroxine (SYNTHROID, LEVOTHROID) 25 MCG tablet Take 25 mcg by mouth every morning.         . lidocaine (LIDODERM) 5 % Place 1 patch onto the skin every 12 (twelve) hours as needed (pain) Remove & Discard patch within 12 hours or as directed by MD 15 patch 0   . lisinopril (ZESTRIL) 20 MG tablet Take 1 tablet (20 mg total) by mouth 2 (two) times daily 180 tablet 3   . Multiple Vitamins-Minerals (MULTIVITAMIN WITH MINERALS) tablet Take 1 tablet by mouth every morning         . rosuvastatin (CRESTOR) 40 MG tablet Take 0.5 tablets (20 mg total) by mouth daily 30 tablet 0   . simvastatin (ZOCOR) 40 MG tablet TAKE 1 TABLET BY MOUTH ONCE DAILY IN THE MORNING 90 tablet 0   . vitamin B-1 (THIAMINE) 100 MG tablet Take  100 mg by mouth every morning.       No current facility-administered medications for this visit.          Review of Systems     Constitutional: Negative for fevers and chills  Skin: No rash or lesions  Respiratory: Negative for cough, wheezing, or hemoptysis  Cardiovascular: as per HPI  Gastrointestinal: Negative for abdominal pain, nausea, vomiting and diarrhea  Musculoskeletal:  No arthritic symptoms  Genitourinary: Negative for dysuria  Otherwise 10 point review of systems is negative.      Physical Exam      Vitals:    07/01/19 1107   BP: 139/77   Pulse: 63   Resp: 16   Temp: 98.2 F (36.8 C)   SpO2: 96%       Body mass index is 27.1 kg/m.    General:  Patient appears their stated age, well-nourished.  Alert and in no apparent distress.  Eyes: No conjunctivitis, no purulent discharge, no lid lag  ENT:  Hearing grossly intact, Nares patent bilaterally, Lips moist, color appropriate for race.  Respiratory: Clear to auscultation and percussion throughout. Respiratory effort unlabored, chest expansion symmetric.    Cardio: Regular rate and rhythm. Normal S1/S2 No carotid bruits or thrills, no JVD.  Extremities: warm, pulses 2+, no peripheral edema  GI: Soft, nondistended, nontender.  No guarding or rebound.  Skin: Color appropriate for race, Skin warm, dry, and intact  Psychiatric: Good insight and judgment, oriented to person, place, and time    Labs     Lipid Panel   Cholesterol   Date/Time Value Ref Range Status   04/16/2019 05:54 AM 186 0 - 199 mg/dL Final     Triglycerides   Date/Time Value Ref Range Status   04/16/2019 05:54 AM 75 34 - 149 mg/dL Final     HDL   Date/Time Value Ref Range Status   04/16/2019 05:54 AM 60 40 - 9,999 mg/dL Final     Comment:     An HDL cholesterol <40 mg/dL is low and constitutes a  coronary heart disease risk factor, and HDL-C>59 mg/dL is  a negative risk factor for CHD.  Ref: American Heart Association; Circulation 2004       CBC   Lab Results   Component Value Date    WBC 7.45 04/15/2019    HGB 12.4 04/15/2019    HCT 38.9 04/15/2019    MCV 82.6 04/15/2019    PLT 353 (H) 04/15/2019      BMP  Lab Results   Component Value Date    CO2 30 (H) 04/15/2019    BUN 12 04/15/2019     INR   Lab Results   Component Value Date    INR 0.9 04/15/2019    INR 0.9 11/13/2018    INR 1.0 05/14/2018        EKG   I have reviewed and interpreted the EKG.  The EKG is significant for NSR, normal intervals, possible LVH.    Summary    1. Normal stress and rest myocardial perfusion study without evidence  of  inducible ischemia or scar.    2. Normal left ventricular function with calculated LVEF 77 %.    3. No prior studies are available for comparison.      Stress ECG Details  Protocol:     Regadenoson  Rest HR:     54 bpm  Peak HR:     100 bpm  Rest Sys BP:  122 mmHg  Peak Sys BP:     121 mmHg  Max Pred HR:     147 bpm  % Max Pred HR:     68 %  Max RPP:     12,100 bpm*mmHg  BP Response:     Normal blood pressure response  Termination Reason:     Protocol Completed, Protocol Completed  Cardiac Symptoms:     Chest discomfort  Total Time:     1 min : 36 sec  Rest Dias BP:     62 mmHg  Peak Dias BP:     60 mmHg  Resting ECG:     Normal sinus rhythm  Stress ECG:     No significant ST changes from baseline to suggest pharmacological  stressinduced myocardial ischemia  Arrhythmias:     No arrhythmias    ------------------------------  Stage:     PREINFSN SUPINE  Duration (min):     23 min : 49 sec  HR (bpm):     55  SBP (mmHg):     122  DBP (mmHg):     62    ------------------------------  Stage:     INFUSION DOSE 1  Duration (min):     1 min : 36 sec  HR (bpm):     100  SBP (mmHg):     ---  DBP (mmHg):     ---    ------------------------------  Stage:     POSTINFSN  Duration (min):     2 min : 0 sec  HR (bpm):     93  SBP (mmHg):     ---  DBP (mmHg):     ---    ------------------------------  Stage:     POSTINFSN  Duration (min):     2 min : 0 sec  HR (bpm):     78  SBP (mmHg):     121  DBP (mmHg):     60    ------------------------------  Stage:     POSTINFSN  Duration (min):     0 min : 18 sec  HR (bpm):     78  SBP (mmHg):     121  DBP (mmHg):     60    Dose:     0.4mg  regadenoson given 30 seconds prior to stress radionuclide.  Duration: 15s    Administration Site:     IV - left wrist      Radiopharmaceutical:     Tc-22m Tetrofosmin    Administration Site:     IV - left wrist    Administered By:     Denton Brick CNMT    Radiopharmaceutical:     Tc2mTetrofosmin    Administration Site:     IV - left  wrist    Administered By:     Denton Brick CNMT    Image Protocol  Protocol:     Rest/Stress 1 Day  Rest  Radiopharmaceutical Dose:     10.5 mCi  Stress  Radiopharmaceutical Dose:     36.0 mCi    Injection to Imaging Time:     45 min    Injection to Imaging Time:     90 min      SPECT Results  Perfusion Findings  Stress and rest SPECT tomographic images reveal a normal perfusion pattern  with no significant defects or areas of reversibility seen.  Technical Quality:     Excellent  Image Artifacts:     None  Image Corrections:  Attenuation correction applied - CT based    Perfusion Quantitative Results  Stress Extent  Global Stress Extent     4 %  Rest Extent  LCX Rest Extent:     0 %  Global Rest Extent:     1 %  Ischemia Extent  Global Ischemia Extent:     2 %      Functional Results  ----------------------------------------------------------------------  Name                                 Value        Normal  ----------------------------------------------------------------------    Stress  ----------------------------------------------------------------------  Stress LV EF                          77 %          >=55  Functional Findings    Gated wall motion study demonstrates normal left ventricular thickening and  wall motion with calculated ejection fraction 77 %.      Report Signatures  MPI SPECT Finalized by Bari Edward  MD on 04/17/2019 01:58 PM  Stress Finalized by Bari Edward  MD on 04/17/2019 01:58 PM    I spent 25 mins with the patient, >50% of the time was spent on education and counseling.      Renard Hamper, MD, MS    [facility] Card[facility] Electrophysiology

## 2019-07-03 ENCOUNTER — Encounter (INDEPENDENT_AMBULATORY_CARE_PROVIDER_SITE_OTHER): Payer: Self-pay | Admitting: Cardiovascular Disease

## 2019-08-08 ENCOUNTER — Emergency Department: Payer: No Typology Code available for payment source

## 2019-08-08 ENCOUNTER — Emergency Department
Admission: EM | Admit: 2019-08-08 | Discharge: 2019-08-08 | Disposition: A | Discharge status: Home / Self Care | Payer: No Typology Code available for payment source | Attending: Emergency Medicine | Admitting: Emergency Medicine

## 2019-08-08 DIAGNOSIS — W108XXA Fall (on) (from) other stairs and steps, initial encounter: Secondary | ICD-10-CM

## 2019-08-08 DIAGNOSIS — S0083XA Contusion of other part of head, initial encounter: Secondary | ICD-10-CM | POA: Insufficient documentation

## 2019-08-08 DIAGNOSIS — E039 Hypothyroidism, unspecified: Secondary | ICD-10-CM | POA: Insufficient documentation

## 2019-08-08 DIAGNOSIS — M542 Cervicalgia: Secondary | ICD-10-CM | POA: Insufficient documentation

## 2019-08-08 DIAGNOSIS — Z7982 Long term (current) use of aspirin: Secondary | ICD-10-CM | POA: Insufficient documentation

## 2019-08-08 DIAGNOSIS — I10 Essential (primary) hypertension: Secondary | ICD-10-CM | POA: Insufficient documentation

## 2019-08-08 DIAGNOSIS — W109XXA Fall (on) (from) unspecified stairs and steps, initial encounter: Secondary | ICD-10-CM | POA: Insufficient documentation

## 2019-08-08 DIAGNOSIS — R0781 Pleurodynia: Secondary | ICD-10-CM | POA: Insufficient documentation

## 2019-08-08 DIAGNOSIS — E785 Hyperlipidemia, unspecified: Secondary | ICD-10-CM | POA: Insufficient documentation

## 2019-08-08 DIAGNOSIS — M25511 Pain in right shoulder: Secondary | ICD-10-CM | POA: Insufficient documentation

## 2019-08-08 MED ORDER — LIDOCAINE 5 % EX PTCH
1.00 | MEDICATED_PATCH | CUTANEOUS | 0 refills | Status: AC
Start: 2019-08-08 — End: 2019-08-22

## 2019-08-08 MED ORDER — LIDOCAINE 5 % EX PTCH
2.00 | MEDICATED_PATCH | CUTANEOUS | Status: DC
Start: 2019-08-08 — End: 2019-08-08
  Administered 2019-08-08: 2 via TRANSDERMAL
  Filled 2019-08-08: qty 2

## 2019-08-08 MED ORDER — IBUPROFEN 600 MG PO TABS
600.00 mg | ORAL_TABLET | Freq: Once | ORAL | Status: AC
Start: 2019-08-08 — End: 2019-08-08
  Administered 2019-08-08: 600 mg via ORAL
  Filled 2019-08-08: qty 1

## 2019-08-08 MED ORDER — IBUPROFEN 600 MG PO TABS
600.0000 mg | ORAL_TABLET | Freq: Four times a day (QID) | ORAL | 0 refills | Status: DC | PRN
Start: 2019-08-08 — End: 2020-03-20

## 2019-08-08 MED ORDER — MECLIZINE HCL 25 MG PO TABS
25.0000 mg | ORAL_TABLET | Freq: Three times a day (TID) | ORAL | 0 refills | Status: DC | PRN
Start: 2019-08-08 — End: 2021-11-11

## 2019-08-08 NOTE — Discharge Instructions (Signed)
You were seen for neck pain, facial contusion, right shoulder pain and right rib pain after falling down the stairs last night.  There was no evidence of brain bleed or broken bones.  Please take ibuprofen or Tylenol as needed for the pain.  Use ice therapy on face as well as shoulder ribs.  Apply the Lidoderm patch to neck and ribs.  Follow-up with your primary care doctor within 1 week.  If symptoms worsen including lightheadedness dizziness, nausea vomiting diarrhea, abnormal vision, come back to the emergency department.

## 2019-08-08 NOTE — EDIE (Signed)
COLLECTIVE?NOTIFICATION?08/08/2019 12:18?Vasiliki, Medell[place] B?MRN: [id number]    [place] [facility]'s patient encounter information:   HWE:?[id number]  Account [id number]  Billing Account [id number]      Criteria Met      5 ED Visits in 12 Months    Security and Safety  No recent Security Events currently on file    ED Care Guidelines  There are currently no ED Care Guidelines for this patient. Please check your facility's medical records system.        Prescription Monitoring Program  000??- Narcotic Use Score  000??- Sedative Use Score  000??- Stimulant Use Score  000??- Overdose Risk Score  - All Scores range from 000-999 with 75% of the population scoring < 200 and on 1% scoring above 650  - The last digit of the narcotic, sedative, and stimulant score indicates the number of active prescriptions of that type  - Higher Use scores correlate with increased prescribers, pharmacies, mg equiv, and overlapping prescriptions  - Higher Overdose Risk Scores correlate with increased risk of unintentional overdose death   Concerning or unexpectedly high scores should prompt a review of the PMP record; this does not constitute checking PMP for prescribing purposes.      E.D. Visit Count (12 mo.)  Facility Visits   [place] [facility] 6   Total 6   Note: Visits indicate total known visits.      Recent Emergency Department Visit Summary  Date Facility [facility] Type Diagnoses or Chief Complaint   Aug 08, 2019 [place] [facility]      fall [id number]/facial injuries      Apr 15, 2019 [place] [facility]      Chest Pain      Dizziness      Numbness      Anesthesia of skin      Feb 22, 2019 [place] [facility] [place] [facility]      post op complications, rt breast      Breast Pain      Scar conditions and fibrosis of skin      Pain, unspecified      Jan 11, 2019 [place] [facility]      triage- cough      triage- cough, fever       Fever      Covid-19 Screening      Other general symptoms and signs      Cough      Generalized abdominal pain      Jan 01, 2019 [place] [facility]      legs swelling, rapid heart rate      Chest Pain      Leg Swelling      Localized edema      Palpitations      Nov 13, 2018 [place] [facility]      CHEST PAIN, ARM TINGLING      Chest Pain      Anesthesia of skin          Recent Inpatient Visit Summary  Date Facility [facility] Type Diagnoses or Chief Complaint   Apr 15, 2019 [place] [facility] [place] [facility]      Anesthesia of skin          Care Team  Provider Specialty Phone Fax Service Dates  Ramon Dredge MD M, MD Family Medicine: Adult Medicine [phone] Current      Collective Portal  This patient has registered at the [facility] Emergency Department   For more information visit: [email or web address]     PLEASE NOTE:     1.   Any care recommendations and other clinical information are provided as guidelines or for historical purposes only, and providers should exercise their own clinical judgment when providing care.    2.   You may only use this information for purposes of treatment, payment or health care operations activities, and subject to the limitations of applicable Collective Policies.    3.   You should consult directly with the organization that provided a care guideline or other clinical history with any questions about additional information or accuracy or completeness of information provided.    ? 2020 [place], [employer] - [email or web address]

## 2019-08-08 NOTE — ED Provider Notes (Signed)
EMERGENCY DEPARTMENT NOTE       HISTORY OF PRESENT ILLNESS   Historian:Patient  Translator Used: no    Chief Complaint: Fall       Mechanism of Injury: Nursing (triage) note reviewed for the following pertinent information:         73 y.o. female with a PMH of right breast cancer with mastectomy, hypertension, hyperlipidemia, and vertigo presents with dizziness, right face pain, neck pain, right shoulder pain, right rib pain after falling down 5 steps yesterday.  Patient denies LOC, abnormal vision, nausea vomiting, chest pain, shortness of breath, numbness tingling, or other symptoms.  Daily ASA 81mg .     1. Location of symptoms: See above  2. Onset of symptoms: Yesterday  3. What was patient doing when symptoms started (Context): see above  4. Severity: moderate  5. Timing: Acute  6. Activities that worsen symptoms: Palpation  7. Activities that improve symptoms: None  8. Quality: Dizzy, sharp, tender  9. Radiation of symptoms: no  10. Associated signs and Symptoms: see above  11. Are symptoms worsening? yes  MEDICAL HISTORY     Past Medical History:  Past Medical History:   Diagnosis Date   . Breast cancer 2014    right breast mastectomy   . GERD (gastroesophageal reflux disease)    . Hyperlipidemia    . Hypertension    . Hypothyroidism    . Malignant neoplasm of overlapping sites of right female breast 10/19/2015   . Vertigo        Past Surgical History:  Past Surgical History:   Procedure Laterality Date   . HYSTERECTOMY     . MASTECTOMY Right 2015       Social History:  Social History     Socioeconomic History   . Marital status: Widowed     Spouse name: Not on file   . Number of children: Not on file   . Years of education: Not on file   . Highest education level: Not on file   Occupational History   . Not on file   Social Needs   . Financial resource strain: Not on file   . Food insecurity     Worry: Not on file     Inability: Not on file   . Transportation needs     Medical: Not on file     Non-medical: Not  on file   Tobacco Use   . Smoking status: Never Smoker   . Smokeless tobacco: Never Used   Substance and Sexual Activity   . Alcohol use: Yes     Alcohol/week: 1.0 standard drinks     Types: 1 Cans of beer per week     Comment: socially   . Drug use: No   . Sexual activity: Not on file   Lifestyle   . Physical activity     Days per week: Not on file     Minutes per session: Not on file   . Stress: Not on file   Relationships   . Social Wellsite geologist on phone: Not on file     Gets together: Not on file     Attends religious service: Not on file     Active member of club or organization: Not on file     Attends meetings of clubs or organizations: Not on file     Relationship status: Not on file   . Intimate partner violence     Fear of  current or ex partner: Not on file     Emotionally abused: Not on file     Physically abused: Not on file     Forced sexual activity: Not on file   Other Topics Concern   . Not on file   Social History Narrative   . Not on file       Family History:  Family History   Problem Relation Age of Onset   . Breast cancer Neg Hx        Outpatient Medication:  Discharge Medication List as of 08/08/2019  2:40 PM      CONTINUE these medications which have NOT CHANGED    Details   amLODIPine (NORVASC) 5 MG tablet Take 1 tablet (5 mg total) by mouth daily, Starting Thu 09/13/2018, Normal      anastrozole (ARIMIDEX) 1 MG tablet Take 1 tablet (1 mg total) by mouth daily, Starting Fri 01/19/2018, Normal      aspirin 81 MG EC tablet Take 81 mg by mouth every morning    , Historical Med      levothyroxine (SYNTHROID, LEVOTHROID) 25 MCG tablet Take 25 mcg by mouth every morning.    , Starting Wed 12/25/2013, Historical Med      lisinopril (ZESTRIL) 20 MG tablet Take 1 tablet (20 mg total) by mouth 2 (two) times daily, Starting Wed 04/17/2019, Normal      Multiple Vitamins-Minerals (MULTIVITAMIN WITH MINERALS) tablet Take 1 tablet by mouth every morning    , Historical Med      rosuvastatin (CRESTOR)  40 MG tablet Take 0.5 tablets (20 mg total) by mouth daily, Starting Wed 04/17/2019, Normal      simvastatin (ZOCOR) 40 MG tablet TAKE 1 TABLET BY MOUTH ONCE DAILY IN THE MORNING, Normal      vitamin B-1 (THIAMINE) 100 MG tablet Take 100 mg by mouth every morning., Historical Med               REVIEW OF SYSTEMS   Review of Systems   Eyes: Negative for blurred vision, double vision and photophobia.   Respiratory: Negative for shortness of breath.    Cardiovascular: Negative for chest pain.   Gastrointestinal: Negative for nausea and vomiting.   Musculoskeletal: Positive for falls, joint pain and neck pain.   Neurological: Positive for dizziness and headaches. Negative for loss of consciousness.   All other systems reviewed and are negative.      PHYSICAL EXAM     ED Triage Vitals [08/08/19 1227]   Enc Vitals Group      BP 137/80      Heart Rate 61      Resp Rate 18      Temp 98.5 F (36.9 C)      Temp Source Oral      SpO2 98 %      Weight 68 kg      Height 1.6 m      Head Circumference       Peak Flow       Pain Score 9      Pain Loc       Pain Edu?       Excl. in GC?      Vitals:    08/08/19 1227 08/08/19 1255 08/08/19 1452   BP: 137/80 129/73 168/76   Pulse: 61 60 78   Resp: 18  18   Temp: 98.5 F (36.9 C)  98.4 F (36.9 C)   TempSrc: Oral  Oral  SpO2: 98% 99% 100%   Weight: 68 kg     Height: 5\' 3"  (1.6 m)       Physical Exam  Vitals signs and nursing note reviewed.   Constitutional:       Appearance: Normal appearance.   HENT:      Head: Normocephalic. Raccoon eyes (R eye) and contusion present. No laceration.      Jaw: There is normal jaw occlusion.      Mouth/Throat:      Mouth: Mucous membranes are moist.   Eyes:      General: Lids are normal. Vision grossly intact. Gaze aligned appropriately.      Extraocular Movements: Extraocular movements intact.      Right eye: Normal extraocular motion and no nystagmus.      Left eye: Normal extraocular motion and no nystagmus.      Conjunctiva/sclera: Conjunctivae  normal.      Right eye: Right conjunctiva is not injected. No hemorrhage.     Pupils: Pupils are equal, round, and reactive to light.   Neck:      Musculoskeletal: Normal range of motion and neck supple. Normal range of motion. Pain with movement and muscular tenderness present. No spinous process tenderness.      Trachea: Trachea normal.   Cardiovascular:      Rate and Rhythm: Normal rate and regular rhythm.      Heart sounds: Normal heart sounds.   Pulmonary:      Effort: Pulmonary effort is normal.   Chest:      Chest wall: Tenderness present.   Abdominal:      General: Bowel sounds are normal.      Palpations: Abdomen is soft.   Musculoskeletal:      Right shoulder: She exhibits tenderness and pain. She exhibits normal range of motion, no bony tenderness and normal strength.   Skin:     General: Skin is warm and dry.      Capillary Refill: Capillary refill takes less than 2 seconds.      Findings: Bruising present.   Neurological:      General: No focal deficit present.      Mental Status: She is alert and oriented to person, place, and time. Mental status is at baseline.      GCS: GCS eye subscore is 4. GCS verbal subscore is 5. GCS motor subscore is 6.      Cranial Nerves: Cranial nerves are intact.      Sensory: Sensation is intact.      Motor: Motor function is intact.      Coordination: Coordination is intact.      Gait: Gait is intact.   Psychiatric:         Mood and Affect: Mood normal.         Behavior: Behavior normal.         Thought Content: Thought content normal.         Judgment: Judgment normal.         MEDICAL DECISION MAKING     DISCUSSION        73 yo female with a PMH of right breast cancer with mastectomy, hypertension, hyperlipidemia, and vertigo presents with dizziness, right face pain, neck pain, right shoulder pain, right rib pain after falling down 5 steps yesterday.  Patient denies LOC, abnormal vision, nausea vomiting, chest pain, shortness of breath, numbness tingling, or other  symptoms.  Daily ASA 81mg .   Normal neurological exam without focal deficits.  Ambulated well without difficulty. Will CT head and orbits to r/o bleed and fractures.   Low suspicion cervical spine and R shoulder fracture; however, will XR  Ibuprofen and lidoderm with ice  No evidence of fractures or intracranial hemorrhage.   Results discussed with patient and recommended to follow up with PMD within 2-3 days. Strict return precautions given. Discharge instructions were reviewed with patient and they were given the opportunity to ask any questions regarding their care today. Patient understood and agreed with treatment and plan of care. Stable to discharge home.    DDx: fall, contusion, sprain/strain, fracture, intracranial hemorrhage      The patient is NOT septic.  All labs and vital signs from the current visit have been   reviewed and any abnormality that is present is not due to sepsis.    Vital Signs: Reviewed the patient's vital signs.   Nursing Notes: Reviewed and utilized available nursing notes.  Medical Records Reviewed: Reviewed available past medical records. Notes, labs, and imaging reviewed.  Counseling: The emergency provider has spoken with the patient and discussed today's findings, in addition to providing specific details for the plan of care.  Questions are answered and there is agreement with the plan.      MIPS DOCUMENTATION      HEAD TRAUMA  INDICATIONS FOR CT HEAD DUE TO TRAUMA    Head CT/trauma indications - Age 48 or older      EMERGENCY IMAGING STUDIES    The following imagine studies were independently interpreted by me (emergency physician):    Radiology:  Interpreted by me Murrell Converse)  Study: Chest Xray, R rib  Results: No infiltrate. No pneumothorax. No hemothorax. No cardiomegaly. No fractures.  Impression: No acute intrathoracic abnormality.    Radiology:  Exam interpreted by me Murrell Converse)  Exam: C-spine  Findings: No fracture. No Dislocation. No foreign body.  Impression: No acute  abnormality.     Radiology:  Exam interpreted by me Murrell Converse)  Exam: R shoulder  Findings: No fracture. No Dislocation. No foreign body.  Impression: No acute abnormality.     RADIOLOGY IMAGING STUDIES      CT Orbits / Sella WO Contrast   Final Result    No fracture.      Heron Nay, MD    08/08/2019 2:33 PM      CT Head without Contrast   Final Result       No hemorrhage or acute abnormality. Stable chronic changes          Heron Nay, MD    08/08/2019 2:30 PM      XR Cervical Spine 4 Or 5 Views   Final Result    No fracture or osseous destruction.. Progressive   degenerative bone spurs throughout the spine when compared to 2017. No   acute fracture. Stable cystic changes in the vertebral bodies which are   likely secondary to osteoporosis. A marrow replacement processes should   be considered.      Kinnie Feil, MD    08/08/2019 1:40 PM      Shoulder Right 2+ Views   Final Result    Acromioclavicular degenerative bone spurs. Stable   intra-articular joint body in the inferior joint space. No fracture or   dislocation      Kinnie Feil, MD    08/08/2019 1:38 PM      Ribs Right/PA Chest   Final Result         1. No acute thoracic process.  2. No displaced fracture or focal lesion in the right ribs.      Aldean Ast, MD    08/08/2019 1:44 PM            PULSE OXIMETRY    Oxygen Saturation by Pulse Oximetry: 100%  Interventions: none  Interpretation:  Stable on RA    EMERGENCY DEPT. MEDICATIONS      ED Medication Orders (From admission, onward)    Start Ordered     Status Ordering Provider    08/08/19 1438 08/08/19 1437  ibuprofen (ADVIL) tablet 600 mg  Once     Route: Oral  Ordered Dose: 600 mg     Last MAR action: Given Dheeraj Hail D    08/08/19 1438 08/08/19 1437    Every 24 hours     Route: Transdermal  Ordered Dose: 2 patch     Discontinued Kharma Sampsel D        CRITICAL CARE/PROCEDURES    Procedures    DIAGNOSIS      Diagnosis:  Final diagnoses:   Fall down steps, initial encounter   Contusion of face, initial  encounter   Acute neck pain   Acute pain of right shoulder   Rib pain on right side       Disposition:  ED Disposition     ED Disposition Condition Date/Time Comment    Discharge  Thu Aug 08, 2019  2:38 PM Patriciaann Clan Lobb discharge to home/self care.    Condition at disposition: Stable            Prescriptions:  Discharge Medication List as of 08/08/2019  2:40 PM      START taking these medications    Details   ibuprofen (ADVIL) 600 MG tablet Take 1 tablet (600 mg total) by mouth every 6 (six) hours as needed for Pain, Starting Thu 08/08/2019, E-Rx         CONTINUE these medications which have CHANGED    Details   lidocaine (LIDODERM) 5 % Place 1 patch onto the skin every 24 hours for 14 days Remove & Discard patch within 12 hours or as directed by MD, Starting Thu 08/08/2019, Until Thu 08/22/2019, E-Rx         CONTINUE these medications which have NOT CHANGED    Details   amLODIPine (NORVASC) 5 MG tablet Take 1 tablet (5 mg total) by mouth daily, Starting Thu 09/13/2018, Normal      anastrozole (ARIMIDEX) 1 MG tablet Take 1 tablet (1 mg total) by mouth daily, Starting Fri 01/19/2018, Normal      aspirin 81 MG EC tablet Take 81 mg by mouth every morning    , Historical Med      levothyroxine (SYNTHROID, LEVOTHROID) 25 MCG tablet Take 25 mcg by mouth every morning.    , Starting Wed 12/25/2013, Historical Med      lisinopril (ZESTRIL) 20 MG tablet Take 1 tablet (20 mg total) by mouth 2 (two) times daily, Starting Wed 04/17/2019, Normal      Multiple Vitamins-Minerals (MULTIVITAMIN WITH MINERALS) tablet Take 1 tablet by mouth every morning    , Historical Med      rosuvastatin (CRESTOR) 40 MG tablet Take 0.5 tablets (20 mg total) by mouth daily, Starting Wed 04/17/2019, Normal      simvastatin (ZOCOR) 40 MG tablet TAKE 1 TABLET BY MOUTH ONCE DAILY IN THE MORNING, Normal      vitamin B-1 (THIAMINE) 100 MG tablet Take 100 mg by mouth every morning.,  Historical Med             This note was generated by the Epic EMR system/ Dragon  speech recognition and may contain inherent errors or omissions not intended by the user. Grammatical errors, random word insertions, deletions and pronoun errors  are occasional consequences of this technology due to software limitations. Not all errors are caught or corrected. If there are questions or concerns about the content of this note or information contained within the body of this dictation they should be addressed directly with the author for clarification.     Sheldon Silvan, FNP  08/10/19 1901

## 2019-08-14 ENCOUNTER — Encounter (INDEPENDENT_AMBULATORY_CARE_PROVIDER_SITE_OTHER): Payer: Self-pay | Admitting: Cardiology

## 2019-08-14 ENCOUNTER — Ambulatory Visit (INDEPENDENT_AMBULATORY_CARE_PROVIDER_SITE_OTHER): Payer: No Typology Code available for payment source | Admitting: Cardiology

## 2019-08-14 VITALS — BP 129/74 | HR 73 | Temp 97.5°F | Resp 16 | Ht 63.0 in | Wt 151.2 lb

## 2019-08-14 DIAGNOSIS — R0789 Other chest pain: Secondary | ICD-10-CM

## 2019-08-14 NOTE — Patient Instructions (Signed)
STOP AMLODIPINE    CHECK BLOOD PRESSURE 3 TIMES A WEEK AT REST AND RECORD THE RESULTS AND BRING RESULTS TO THE NEXT OFFICE VISIT.

## 2019-08-14 NOTE — Progress Notes (Signed)
[place] CARDIOLOGY PROGRESS NOTE    I had the pleasure of seeing Emily [name] today for cardiovascular follow up. She is a pleasant 73 y.o. female with a history of hypertension and hyperlipidemia who was in hospital July 2020 with atypical chest discomfort and had a normal Lexiscan nuclear stress April 15, 2019 and echocardiogram April 17, 2019 showing ejection fraction of 60% and no significant valvular abnormality and also during that hospital stay the patient was concerned about ankle swelling and numbness and tingling in the left leg who presents now again with numbness and tingling in the left leg and concerns about ankle swelling with intermittent chest discomfort that comes and goes not associated with exertion.        MEDICATIONS:  Current Outpatient Medications   Medication Sig Dispense Refill   . anastrozole (ARIMIDEX) 1 MG tablet Take 1 tablet (1 mg total) by mouth daily (Patient taking differently: Take 1 mg by mouth every morning    ) 90 tablet 3   . aspirin 81 MG EC tablet Take 81 mg by mouth every morning         . ibuprofen (ADVIL) 600 MG tablet Take 1 tablet (600 mg total) by mouth every 6 (six) hours as needed for Pain 30 tablet 0   . levothyroxine (SYNTHROID, LEVOTHROID) 25 MCG tablet Take 25 mcg by mouth every morning.         . lidocaine (LIDODERM) 5 % Place 1 patch onto the skin every 24 hours for 14 days Remove & Discard patch within 12 hours or as directed by MD 14 patch 0   . lisinopril (ZESTRIL) 20 MG tablet Take 1 tablet (20 mg total) by mouth 2 (two) times daily 180 tablet 3   . meclizine (ANTIVERT) 25 MG tablet Take 1 tablet (25 mg total) by mouth 3 (three) times daily as needed for Dizziness 10 tablet 0   . Multiple Vitamins-Minerals (MULTIVITAMIN WITH MINERALS) tablet Take 1 tablet by mouth every morning         . rosuvastatin (CRESTOR) 40 MG tablet Take 0.5 tablets (20 mg total) by mouth daily 30 tablet 0   . simvastatin (ZOCOR) 40 MG tablet TAKE 1 TABLET BY MOUTH ONCE DAILY IN THE  MORNING 90 tablet 0   . vitamin B-1 (THIAMINE) 100 MG tablet Take 100 mg by mouth every morning.           REVIEW OF SYSTEMS: All other systems reviewed and negative except as above.    PHYSICAL EXAMINATION  General Appearance: well-appearing and in no acute distress.   Vital Signs: BP 129/74 (BP Site: Left arm, Patient Position: Sitting, Cuff Size: Medium)   Pulse 73   Temp 97.5 F (36.4 C)   Resp 16   Ht 1.6 m (5\' 3" )   Wt 68.6 kg (151 lb 3.2 oz)   SpO2 97%   BMI 26.78 kg/m    HEENT: Sclera anicteric, conjunctiva without pallor, moist mucous membranes.  Neck: Supple without jugular venous distention.    Chest: Clear to auscultation bilaterally with good air movement and respiratory effort and no wheezes, rales, or rhonchi  Cardiovascular: Normal S1 and  S2 without murmurs, gallops or rub. PMI of normal size and nondisplaced.   Abdomen: Soft, nontender. No organomegaly.  No pulsatile masses or bruits.    Extremities: Warm with trace to mild edema bilaterally  ECG: Sinus rhythm normal EKG  Past Medical History:   Diagnosis Date   . Breast cancer 2014  right breast mastectomy   . GERD (gastroesophageal reflux disease)    . Hyperlipidemia    . Hypertension    . Hypothyroidism    . Malignant neoplasm of overlapping sites of right female breast 10/19/2015   . Vertigo      Family History   Problem Relation Age of Onset   . Breast cancer Neg Hx      Social History     Socioeconomic History   . Marital status: Widowed     Spouse name: None   . Number of children: None   . Years of education: None   . Highest education level: None   Occupational History   . None   Social Needs   . Financial resource strain: None   . Food insecurity     Worry: None     Inability: None   . Transportation needs     Medical: None     Non-medical: None   Tobacco Use   . Smoking status: Never Smoker   . Smokeless tobacco: Never Used   Substance and Sexual Activity   . Alcohol use: Yes     Alcohol/week: 1.0 standard drinks     Types: 1  Cans of beer per week     Comment: socially   . Drug use: No   . Sexual activity: None   Lifestyle   . Physical activity     Days per week: None     Minutes per session: None   . Stress: None   Relationships   . Social Wellsite geologist on phone: None     Gets together: None     Attends religious service: None     Active member of club or organization: None     Attends meetings of clubs or organizations: None     Relationship status: None   . Intimate partner violence     Fear of current or ex partner: None     Emotionally abused: None     Physically abused: None     Forced sexual activity: None   Other Topics Concern   . None   Social History Narrative   . None         IMPRESSION:  Atypical chest discomfort with a normal Lexiscan nuclear scan July 2020, noncardiac  Echocardiogram July 2020 with ejection fraction of 60% and no significant valvular abnormality  Trace to mild edema bilaterally with paresthesias in the left lower extremity suggestive of neuropathy or could be related to amlodipine      PLAN:   [place] amlodipine  Patient will check blood pressures over the next month and I will see the patient in a month to decide if blood pressure remains stable off amlodipine      Royann Shivers, MD   08/14/2019

## 2019-08-15 ENCOUNTER — Encounter (INDEPENDENT_AMBULATORY_CARE_PROVIDER_SITE_OTHER): Payer: Self-pay | Admitting: Cardiology

## 2019-09-16 ENCOUNTER — Ambulatory Visit (INDEPENDENT_AMBULATORY_CARE_PROVIDER_SITE_OTHER): Payer: No Typology Code available for payment source | Admitting: Cardiology

## 2019-10-04 HISTORY — PX: COLONOSCOPY, DIAGNOSTIC (SCREENING): SHX174

## 2019-10-15 ENCOUNTER — Other Ambulatory Visit (INDEPENDENT_AMBULATORY_CARE_PROVIDER_SITE_OTHER): Payer: Self-pay | Admitting: Cardiology

## 2019-10-15 DIAGNOSIS — I10 Essential (primary) hypertension: Secondary | ICD-10-CM

## 2019-10-15 MED ORDER — AMLODIPINE BESYLATE 5 MG PO TABS
5.0000 mg | ORAL_TABLET | Freq: Every day | ORAL | 3 refills | Status: DC
Start: 2019-10-15 — End: 2020-01-13

## 2019-12-10 ENCOUNTER — Encounter (INDEPENDENT_AMBULATORY_CARE_PROVIDER_SITE_OTHER): Payer: Self-pay | Admitting: Cardiology

## 2019-12-10 ENCOUNTER — Encounter (INDEPENDENT_AMBULATORY_CARE_PROVIDER_SITE_OTHER): Payer: Medicare Other

## 2019-12-10 ENCOUNTER — Ambulatory Visit (INDEPENDENT_AMBULATORY_CARE_PROVIDER_SITE_OTHER): Payer: No Typology Code available for payment source | Admitting: Cardiology

## 2019-12-10 VITALS — BP 136/66 | HR 67 | Temp 98.0°F | Resp 18 | Ht 62.0 in | Wt 158.0 lb

## 2019-12-10 DIAGNOSIS — R002 Palpitations: Secondary | ICD-10-CM

## 2019-12-10 DIAGNOSIS — R0789 Other chest pain: Secondary | ICD-10-CM

## 2019-12-10 DIAGNOSIS — I10 Essential (primary) hypertension: Secondary | ICD-10-CM

## 2019-12-10 NOTE — Progress Notes (Signed)
[place] PROGRESS NOTE    I had the pleasure of seeing Emily [name] today for cardiovascular follow up. She is a pleasant 74 y.o. female with a history of atypical chest discomfort July 2020 that led to a normal Lexiscan nuclear scan and echocardiogram both July 2020 with ejection fraction of 60% and no significant valvular abnormality.  The patient has noted left chest discomfort lasting a few seconds at a time.  The patient is also noted palpitations but no syncope or near syncope        MEDICATIONS:  Current Outpatient Medications   Medication Sig Dispense Refill   . amLODIPine (NORVASC) 5 MG tablet Take 1 tablet (5 mg total) by mouth daily 90 tablet 3   . anastrozole (ARIMIDEX) 1 MG tablet Take 1 tablet (1 mg total) by mouth daily (Patient taking differently: Take 1 mg by mouth every morning    ) 90 tablet 3   . aspirin 81 MG EC tablet Take 81 mg by mouth every morning         . ibuprofen (ADVIL) 600 MG tablet Take 1 tablet (600 mg total) by mouth every 6 (six) hours as needed for Pain 30 tablet 0   . levothyroxine (SYNTHROID, LEVOTHROID) 25 MCG tablet Take 25 mcg by mouth every morning.         Marland Kitchen lisinopril (ZESTRIL) 20 MG tablet Take 1 tablet (20 mg total) by mouth 2 (two) times daily 180 tablet 3   . meclizine (ANTIVERT) 25 MG tablet Take 1 tablet (25 mg total) by mouth 3 (three) times daily as needed for Dizziness 10 tablet 0   . Multiple Vitamins-Minerals (MULTIVITAMIN WITH MINERALS) tablet Take 1 tablet by mouth every morning         . rosuvastatin (CRESTOR) 40 MG tablet Take 0.5 tablets (20 mg total) by mouth daily 30 tablet 0   . simvastatin (ZOCOR) 40 MG tablet TAKE 1 TABLET BY MOUTH ONCE DAILY IN THE MORNING 90 tablet 0   . vitamin B-1 (THIAMINE) 100 MG tablet Take 100 mg by mouth every morning.           REVIEW OF SYSTEMS: All other systems reviewed and negative except as above.    PHYSICAL EXAMINATION  General Appearance: well-appearing and in no acute distress.   Vital Signs: BP 136/66 (BP  Site: Left arm, Patient Position: Sitting, Cuff Size: Medium)   Pulse 67   Temp 98 F (36.7 C)   Resp 18   Ht 1.575 m (5\' 2" )   Wt 71.7 kg (158 lb)   SpO2 98%   BMI 28.90 kg/m    HEENT: Sclera anicteric, conjunctiva without pallor, moist mucous membranes.  Neck: Supple without jugular venous distention.  Normal carotid upstrokes without bruits.  Chest: Clear to auscultation bilaterally with good air movement and respiratory effort and no wheezes, rales, or rhonchi  Cardiovascular: Normal S1 and  S2 without murmurs, gallops or rub. PMI of normal size and nondisplaced.   Extremities: Warm without edema.   ECG: Sinus rhythm normal EKG  Past Medical History:   Diagnosis Date   . Breast cancer 2014    right breast mastectomy   . GERD (gastroesophageal reflux disease)    . Hyperlipidemia    . Hypertension    . Hypothyroidism    . Malignant neoplasm of overlapping sites of right female breast 10/19/2015   . Vertigo      Family History   Problem Relation Age of Onset   .  Breast cancer Neg Hx      Social History     Socioeconomic History   . Marital status: Widowed     Spouse name: None   . Number of children: None   . Years of education: None   . Highest education level: None   Occupational History   . None   Social Needs   . Financial resource strain: None   . Food insecurity     Worry: None     Inability: None   . Transportation needs     Medical: None     Non-medical: None   Tobacco Use   . Smoking status: Never Smoker   . Smokeless tobacco: Never Used   Substance and Sexual Activity   . Alcohol use: Yes     Alcohol/week: 1.0 standard drinks     Types: 1 Cans of beer per week     Comment: socially   . Drug use: No   . Sexual activity: None   Lifestyle   . Physical activity     Days per week: None     Minutes per session: None   . Stress: None   Relationships   . Social Wellsite geologist on phone: None     Gets together: None     Attends religious service: None     Active member of club or organization: None      Attends meetings of clubs or organizations: None     Relationship status: None   . Intimate partner violence     Fear of current or ex partner: None     Emotionally abused: None     Physically abused: None     Forced sexual activity: None   Other Topics Concern   . None   Social History Narrative   . None         IMPRESSION:  Atypical chest discomfort with normal work-up for this July 2020 and pain is likely noncardiac  Palpitations as noted  Hypertension controlled      PLAN:   2-week E patch monitor  Will discuss with the patient in a month      Royann Shivers, MD [facility]  12/10/2019

## 2019-12-11 ENCOUNTER — Encounter (INDEPENDENT_AMBULATORY_CARE_PROVIDER_SITE_OTHER): Payer: Self-pay

## 2019-12-28 ENCOUNTER — Emergency Department: Payer: No Typology Code available for payment source

## 2019-12-28 ENCOUNTER — Emergency Department
Admission: EM | Admit: 2019-12-28 | Discharge: 2019-12-28 | Disposition: A | Discharge status: Home / Self Care | Payer: No Typology Code available for payment source | Attending: Emergency Medicine | Admitting: Emergency Medicine

## 2019-12-28 DIAGNOSIS — Z9011 Acquired absence of right breast and nipple: Secondary | ICD-10-CM | POA: Insufficient documentation

## 2019-12-28 DIAGNOSIS — Z7982 Long term (current) use of aspirin: Secondary | ICD-10-CM | POA: Insufficient documentation

## 2019-12-28 DIAGNOSIS — Z853 Personal history of malignant neoplasm of breast: Secondary | ICD-10-CM | POA: Insufficient documentation

## 2019-12-28 DIAGNOSIS — R42 Dizziness and giddiness: Secondary | ICD-10-CM | POA: Insufficient documentation

## 2019-12-28 DIAGNOSIS — E785 Hyperlipidemia, unspecified: Secondary | ICD-10-CM | POA: Insufficient documentation

## 2019-12-28 DIAGNOSIS — I10 Essential (primary) hypertension: Secondary | ICD-10-CM | POA: Insufficient documentation

## 2019-12-28 DIAGNOSIS — R0789 Other chest pain: Secondary | ICD-10-CM | POA: Insufficient documentation

## 2019-12-28 DIAGNOSIS — E039 Hypothyroidism, unspecified: Secondary | ICD-10-CM | POA: Insufficient documentation

## 2019-12-28 DIAGNOSIS — R079 Chest pain, unspecified: Secondary | ICD-10-CM

## 2019-12-28 DIAGNOSIS — N644 Mastodynia: Secondary | ICD-10-CM

## 2019-12-28 DIAGNOSIS — K219 Gastro-esophageal reflux disease without esophagitis: Secondary | ICD-10-CM | POA: Insufficient documentation

## 2019-12-28 LAB — CBC AND DIFFERENTIAL
Absolute NRBC: 0 10*3/uL (ref 0.00–0.00)
Basophils Absolute Automated: 0.03 10*3/uL (ref 0.00–0.08)
Basophils Automated: 0.4 %
Eosinophils Absolute Automated: 0.02 10*3/uL (ref 0.00–0.44)
Eosinophils Automated: 0.3 %
Hematocrit: 41.4 % (ref 34.7–43.7)
Hgb: 12.7 g/dL (ref 11.4–14.8)
Immature Granulocytes Absolute: 0.02 10*3/uL (ref 0.00–0.07)
Immature Granulocytes: 0.3 %
Lymphocytes Absolute Automated: 1.51 10*3/uL (ref 0.42–3.22)
Lymphocytes Automated: 20.9 %
MCH: 26.9 pg (ref 25.1–33.5)
MCHC: 30.7 g/dL — ABNORMAL LOW (ref 31.5–35.8)
MCV: 87.7 fL (ref 78.0–96.0)
MPV: 11.4 fL (ref 8.9–12.5)
Monocytes Absolute Automated: 0.44 10*3/uL (ref 0.21–0.85)
Monocytes: 6.1 %
Neutrophils Absolute: 5.21 10*3/uL (ref 1.10–6.33)
Neutrophils: 72 %
Nucleated RBC: 0 /100 WBC (ref 0.0–0.0)
Platelets: 258 10*3/uL (ref 142–346)
RBC: 4.72 10*6/uL (ref 3.90–5.10)
RDW: 13 % (ref 11–15)
WBC: 7.23 10*3/uL (ref 3.10–9.50)

## 2019-12-28 LAB — ECG 12-LEAD
Atrial Rate: 68 {beats}/min
Atrial Rate: 88 {beats}/min
P Axis: 52 degrees
P Axis: 72 degrees
P-R Interval: 164 ms
P-R Interval: 192 ms
Q-T Interval: 374 ms
Q-T Interval: 358 ms
QRS Duration: 88 ms
QRS Duration: 68 ms
QTC Calculation (Bezet): 397 ms
QTC Calculation (Bezet): 433 ms
R Axis: 30 degrees
R Axis: 65 degrees
T Axis: 18 degrees
T Axis: 81 degrees
Ventricular Rate: 68 {beats}/min
Ventricular Rate: 88 {beats}/min

## 2019-12-28 LAB — GFR: EGFR: >60

## 2019-12-28 LAB — COMPREHENSIVE METABOLIC PANEL
ALT: 14 U/L (ref 0–55)
AST (SGOT): 18 U/L (ref 5–34)
Albumin/Globulin Ratio: 1.6 (ref 0.9–2.2)
Albumin: 4.1 g/dL (ref 3.5–5.0)
Alkaline Phosphatase: 81 U/L (ref 37–106)
Anion Gap: 10 (ref 5.0–15.0)
BUN: 16 mg/dL (ref 7–19)
Bilirubin, Total: 0.4 mg/dL (ref 0.2–1.2)
CO2: 29 mEq/L (ref 22–29)
Calcium: 9.2 mg/dL (ref 7.9–10.2)
Chloride: 103 mEq/L (ref 100–111)
Creatinine: 0.8 mg/dL (ref 0.6–1.0)
Globulin: 2.5 g/dL (ref 2.0–3.6)
Glucose: 98 mg/dL (ref 70–100)
Potassium: 4.2 mEq/L (ref 3.5–5.1)
Protein, Total: 6.6 g/dL (ref 6.0–8.3)
Sodium: 142 mEq/L (ref 136–145)

## 2019-12-28 LAB — TROPONIN I: Troponin I: <0.01 ng/mL (ref 0.00–0.05)

## 2019-12-28 MED ORDER — HYDRALAZINE HCL 20 MG/ML IJ SOLN
10.00 mg | Freq: Once | INTRAMUSCULAR | Status: AC
Start: 2019-12-28 — End: 2019-12-28
  Administered 2019-12-28: 10 mg via INTRAVENOUS
  Filled 2019-12-28: qty 1

## 2019-12-28 MED ORDER — IOHEXOL 350 MG/ML IV SOLN
100.00 mL | Freq: Once | INTRAVENOUS | Status: AC | PRN
Start: 2019-12-28 — End: 2019-12-28
  Administered 2019-12-28: 100 mL via INTRAVENOUS

## 2019-12-28 NOTE — Discharge Instructions (Signed)
Hypertension, Exacerbation    You have been previously diagnosed with hypertension. Hypertension means high blood pressure that happens every day. To be diagnosed with hypertension, the blood pressure readings must be abnormally high at least 3 different times. There are causes for high blood pressure that doctors can find. These include being overweight or having a kidney or hormone problem. This is called secondary hypertension. When a doctor does not know the cause, it is called "essential hypertension." Both types of hypertension may require medicine to lower the blood pressure. Some people with high blood pressure will improve if they limit the sodium (salt) in their diets.    High blood pressure often has no symptoms. However, it can cause headaches or vision problems. In rare cases, very high blood pressure can cause seizures (fits). It is important to diagnose and treat hypertension even if there are no symptoms. This is because high blood pressure can cause damage to organs like the heart and kidneys. This damage can be permanent (not go away). That is why it is very important to take all medicines prescribed for your condition.    See your doctor in the next 24 hours.     YOU SHOULD SEEK MEDICAL ATTENTION IMMEDIATELY, EITHER HERE OR AT THE NEAREST EMERGENCY DEPARTMENT IF ANY OF THE FOLLOWING OCCUR:   Your chest hurts or you have trouble breathing.   You have a severe headache or have trouble seeing.   You have seizures.   You vomit (throw up) repeatedly or get more ill.             Mastodynia    You have been diagnosed with mastodynia.     Mastodynia is pain in the breasts. It is common, and it has many causes. Some of the causes are cyclical. This means that they follow a pattern, most often related to your menstrual cycle (period). Other causes are noncyclical. This means that they don't follow a pattern. Mastodynia is different than mastitis. Mastitis happens when the milk ducts of the  breast get blocked, which happens in women who are breastfeeding.     Most of the time cyclical breast pain is normal. It most often happens in the week or days before your period. It usually goes away when your period starts. This pain is caused by ovulation. This is because ovulation causes your breast tissue to grow. Birth control pills and hormone therapy can also cause pain in the breasts. You will most often feel pain in both breasts. The pain is strongest at the top of the breast and the place where the breasts connect to your chest.    Noncyclical breast pain has many causes.     The hormone changes of puberty, pregnancy, and menopause can cause pain in the breasts. However, you will most often feel pain on only one side of the breast. The pain might be constant or it might come and go.      Breast size is also important. Large breasts stretch the connective tissue that support the breast (called Cooper's ligaments), which can also cause pain.        Other causes of breast pain are tumors and infections such as abscess. (An abscess is an area of infected fluid that forms under your skin.) However, with an infection, your skin will most often be red or blistered, or show some other change. Most often, breast cancer is not painful.      Your breast pain might be caused by something  other than your breasts. For example, you might have pain in the muscles close to the breasts. You might also have a problem with the nerves of your spine. These things make it seem like your breast is in pain.      Also, breast pain is connected to a medical problem called ductal ectasia. With ductal ectasia, you have a fever along with swelling in your milk ducts.    Most often, the cause of your breast pain can be found through your medical history and a physical exam. You might have more tests done if your doctor thinks your pain has a dangerous cause such as cancer. For example, you might have an ultrasound, a CAT  scan, or an MRI.    Treatment usually depends on the cause of the pain. Anti-inflammatory medications are helpful. If your pain is very serious, though, you might get a medicine like tamoxifen (Nolvadex, Soltamox) or danazol (Danocrine).     Follow up with your primary care doctor or OB-GYN (women's doctor) for a breast exam every year. You should also follow up if the pain is still there.     If you have large breasts, wear a bra with good support.     Your doctor might have you take acetaminophen or an anti-inflammatory medicine such as ibuprofen. Your doctor might also give you an anti-inflammatory medicine that can be rubbed into your skin. Use the medicine as directed.     Don't drive or operate heavy machinery if you are given narcotic (opiate) pain medicine.    Though we don't believe your condition is serious right now, it is important to be careful. Sometimes a problem that seems mild can become serious later. This is why it is very important that you return here or go to the nearest Emergency Department if you are not improving or your symptoms are getting worse.    YOU SHOULD SEEK MEDICAL ATTENTION IMMEDIATELY, EITHER HERE OR AT THE NEAREST EMERGENCY DEPARTMENT, IF ANY OF THE FOLLOWING OCCUR:     Your breast pain gets worse. Something unusual comes out of your breasts.    You get a fever (temperature higher than 100.3F or 38C) or chills.    You have uncontrollable nausea (feeling sick to your stomach). You have uncontrollable vomiting (throwing up).     If you can't follow up with your doctor, or if at any time you feel you need to be rechecked or seen again, come back here or go to the nearest emergency department.             Chest Pain of Unclear Etiology    You have been seen for chest pain. The cause of your pain is not yet known.    Your doctor has learned about your medical history, examined you, and checked any tests that were done. Still, it is not clear why you are having  pain. The doctor thinks there is only a small chance that your pain is caused by a health problem that could lead to serious harm or death. Later, your primary care doctor might do more tests or check you again.    Sometimes chest pain is caused by a health problem that can lead to death, like a:   Heart attack.   Injury to the large blood vessel in your body (aorta).   Blood clot in the lung.   Collapsed lung.     It is not likely that your pain is caused by a health  problem that could lead to death if:    Your chest pain lasts only a few seconds at a time   You are not short of breath, nauseated (sick to your stomach), sweaty, or lightheaded   Your pain gets worse when you twist or bend   Your pain improves with exercise or hard work.    Chest pain is serious. It is very important that you follow up with your regular doctor.    Return here or go to the nearest Emergency Department immediately if:   Your pain makes you short of breath, nauseated (sick to your stomach), or sweaty.   Your pain gets worse when you walk, go up stairs, or exert yourself.   You feel weak, lightheaded, or faint.   It hurts to breathe.   Your leg swells.   Your pain or symptoms get worse    You have new symptoms or concerns.    If you can't follow up with your doctor, or if at any time you feel you need to be rechecked or seen again, come back here or go to the nearest emergency department.

## 2019-12-28 NOTE — ED Notes (Signed)
Bed: E07  Expected date:   Expected time:   Means of arrival:   Comments:

## 2019-12-28 NOTE — EDIE (Signed)
COLLECTIVE?NOTIFICATION?12/28/2019 12:35?Gaila, Engebretsen B?MRN: [id number]    [facility]'s patient encounter information:   VWU:[id number]  Account [id number]  Billing Account [id number]      Criteria Met      5 ED Visits in 12 Months    Security and Safety  No recent Security Events currently on file    ED Care Guidelines  There are currently no ED Care Guidelines for this patient. Please check your facility's medical records system.        Prescription Monitoring Program  000??- Narcotic Use Score  000??- Sedative Use Score  000??- Stimulant Use Score  000??- Overdose Risk Score  - All Scores range from 000-999 with 75% of the population scoring < 200 and on 1% scoring above 650  - The last digit of the narcotic, sedative, and stimulant score indicates the number of active prescriptions of that type  - Higher Use scores correlate with increased prescribers, pharmacies, mg equiv, and overlapping prescriptions  - Higher Overdose Risk Scores correlate with increased risk of unintentional overdose death   Concerning or unexpectedly high scores should prompt a review of the PMP record; this does not constitute checking PMP for prescribing purposes.      E.D. Visit Count (12 mo.)  Facility Visits   [facility] 6   Total 6   Note: Visits indicate total known visits.     Recent Emergency Department Visit Summary  Date Facility [facility] Type Diagnoses or Chief Complaint   Dec 28, 2019 [place] [facility]      both breast pain      Aug 08, 2019 [place] [facility]      fall 110420/facial injuries      Fall      Contusion of other part of head, initial encounter      Cervicalgia      Pleurodynia      Fall (on) (from) other stairs and steps, initial encounter      Pain in right shoulder      Apr 15, 2019 [place] [facility]      Chest Pain      Dizziness      Numbness      Anesthesia of skin      Feb 22, 2019 [place] [facility]      post op complications, rt breast      Breast Pain      Scar conditions and fibrosis of skin      Pain, unspecified      Jan 11, 2019 [place] [facility]      triage- cough      triage- cough, fever      Fever      Covid-19 Screening      Other general symptoms and signs      Cough      Generalized abdominal pain      Jan 01, 2019  [facility] [place] [facility]      legs swelling, rapid heart rate      Chest Pain      Leg Swelling      Localized edema      Palpitations          Recent Inpatient Visit Summary  Date Facility [facility] Type Diagnoses or Chief Complaint   Apr 15, 2019 [place] [facility]      Anesthesia of skin          Care Team  Provider Specialty Phone Fax Service Dates   DAVIDSON, Janet Berlin MD M, MD Family Medicine: Adult Medicine [phone] Current      Collective Portal  This patient has registered at the [facility] Emergency Department   For more information visit: [email or web address]     PLEASE NOTE:     1.   Any care recommendations and other clinical information are provided as guidelines or for historical purposes only, and providers should exercise their own clinical judgment when providing care.    2.   You may only use this information for purposes of treatment, payment or health care operations activities, and subject to the limitations of applicable Collective Policies.    3.   You should consult directly with the organization that provided a care guideline or other clinical history with any questions about additional information or accuracy or completeness of information provided.    ? 2021 [place] - [email or web address]

## 2019-12-28 NOTE — ED Provider Notes (Signed)
EMERGENCY DEPARTMENT NOTE     Patient initially seen and examined at   ED PHYSICIAN ASSIGNED     None         ED MIDLEVEL (APP) ASSIGNED     Date/Time Event User Comments    12/28/19 1313 PA/NP Provider Assigned Bearl Mulberry, Della Goo, PA assigned as Physician Assistant          HISTORY OF PRESENT ILLNESS   Historian:Patient  Translator Used: No    Chief Complaint: Chest Pain     Mechanism of Injury:       74 y.o. female with PMH breast cancer s/p right mastectomy, HTN, HLD, vertigo presents c/o bilateral breast/chest pain since last night. Describes the pain as sharp and intermittent, radiating to her back, aggravated by movement. Associated with dizziness. Denies diaphoresis, palpitations, fever, chills, weakness, numbness, sob, nv, abd pain, dec appetite, change in weight.    1. Location of symptoms: breast   2. Onset of symptoms: last night  3. What was patient doing when symptoms started (Context): see above  4. Severity: moderate  5. Timing: gradual  6. Activities that worsen symptoms: denies  7. Activities that improve symptoms: denies  8. Quality: sharp  9. Radiation of symptoms: no  10. Associated signs and Symptoms: see above  11. Are symptoms worsening? Yes    MEDICAL HISTORY     Past Medical History:  Past Medical History:   Diagnosis Date   . Breast cancer 2014    right breast mastectomy   . GERD (gastroesophageal reflux disease)    . Hyperlipidemia    . Hypertension    . Hypothyroidism    . Malignant neoplasm of overlapping sites of right female breast 10/19/2015   . Vertigo        Past Surgical History:  Past Surgical History:   Procedure Laterality Date   . HYSTERECTOMY     . MASTECTOMY Right 2015       Social History:  Social History     Socioeconomic History   . Marital status: Widowed     Spouse name: Not on file   . Number of children: Not on file   . Years of education: Not on file   . Highest education level: Not on file   Occupational History   . Not on file   Tobacco Use    . Smoking status: Never Smoker   . Smokeless tobacco: Never Used   Substance and Sexual Activity   . Alcohol use: Yes     Alcohol/week: 1.0 standard drinks     Types: 1 Cans of beer per week     Comment: socially   . Drug use: No   . Sexual activity: Not on file   Other Topics Concern   . Not on file   Social History Narrative   . Not on file     Social Determinants of Health     Financial Resource Strain:    . Difficulty of Paying Living Expenses:    Food Insecurity:    . Worried About Programme researcher, broadcasting/film/video in the Last Year:    . Barista in the Last Year:    Transportation Needs:    . Freight forwarder (Medical):    Marland Kitchen Lack of Transportation (Non-Medical):    Physical Activity:    . Days of Exercise per Week:    . Minutes of Exercise per Session:    Stress:    .  Feeling of Stress :    Social Connections:    . Frequency of Communication with Friends and Family:    . Frequency of Social Gatherings with Friends and Family:    . Attends Religious Services:    . Active Member of Clubs or Organizations:    . Attends Banker Meetings:    Marland Kitchen Marital Status:    Intimate Partner Violence:    . Fear of Current or Ex-Partner:    . Emotionally Abused:    Marland Kitchen Physically Abused:    . Sexually Abused:        Family History:  Family History   Problem Relation Age of Onset   . Breast cancer Neg Hx        Outpatient Medication:  Discharge Medication List as of 12/28/2019  5:23 PM      CONTINUE these medications which have NOT CHANGED    Details   amLODIPine (NORVASC) 5 MG tablet Take 1 tablet (5 mg total) by mouth daily, Starting Tue 10/15/2019, Until Mon 01/13/2020, E-Rx      anastrozole (ARIMIDEX) 1 MG tablet Take 1 tablet (1 mg total) by mouth daily, Starting Fri 01/19/2018, Normal      aspirin 81 MG EC tablet Take 81 mg by mouth every morning    , Historical Med      ibuprofen (ADVIL) 600 MG tablet Take 1 tablet (600 mg total) by mouth every 6 (six) hours as needed for Pain, Starting Thu 08/08/2019, E-Rx       levothyroxine (SYNTHROID, LEVOTHROID) 25 MCG tablet Take 25 mcg by mouth every morning.    , Starting Wed 12/25/2013, Historical Med      lisinopril (ZESTRIL) 20 MG tablet Take 1 tablet (20 mg total) by mouth 2 (two) times daily, Starting Wed 04/17/2019, Normal      meclizine (ANTIVERT) 25 MG tablet Take 1 tablet (25 mg total) by mouth 3 (three) times daily as needed for Dizziness, Starting Thu 08/08/2019, E-Rx      Multiple Vitamins-Minerals (MULTIVITAMIN WITH MINERALS) tablet Take 1 tablet by mouth every morning    , Historical Med      rosuvastatin (CRESTOR) 40 MG tablet Take 0.5 tablets (20 mg total) by mouth daily, Starting Wed 04/17/2019, Normal      simvastatin (ZOCOR) 40 MG tablet TAKE 1 TABLET BY MOUTH ONCE DAILY IN THE MORNING, Normal      vitamin B-1 (THIAMINE) 100 MG tablet Take 100 mg by mouth every morning., Historical Med               REVIEW OF SYSTEMS   Review of Systems   Constitutional: Negative for chills and fever.   HENT: Negative for ear discharge, ear pain and sore throat.    Respiratory: Negative for cough and shortness of breath.    Cardiovascular: Positive for chest pain (described as breast pain).   Gastrointestinal: Negative for abdominal pain, diarrhea, nausea and vomiting.   Genitourinary: Negative for dyspareunia and dysuria.   Musculoskeletal: Negative for arthralgias and back pain.   Skin: Negative for rash and wound.   Allergic/Immunologic: Negative for environmental allergies and food allergies.   Neurological: Positive for dizziness. Negative for light-headedness.   Hematological: Negative for adenopathy. Does not bruise/bleed easily.   Psychiatric/Behavioral: Negative for agitation and behavioral problems.      PHYSICAL EXAM     ED Triage Vitals [12/28/19 1247]   Enc Vitals Group      BP 149/68  Heart Rate 67      Resp Rate 16      Temp 98.8 F (37.1 C)      Temp Source Oral      SpO2 99 %      Weight 71.5 kg      Height 1.6 m      Head Circumference       Peak Flow        Pain Score 8      Pain Loc       Pain Edu?       Excl. in GC?        Constitutional:WD, WN, NAD, speaking in full sentences  Head: Atraumatic. Normocephalic.   Eyes:No scleral icterus  ZOX:WRUEAV membranes are moist and intact. Oropharynx is clear. non-erythematous, no exudate, no edema, Uvula is midlinewith no airway compromise and oropharynx is clear and moist. Notonsillar abscesses.No rhinorrhea.Patent airway. External ears normal.  Neck:Supple. Full ROM.   Cardiovascular:intact distal pulses  Pulmonary/Chest:Clear to auscultation bilaterally.No resp distress, No wheezing  Abdominal:Soft, nttp  Back:Full ROM. Nontender.   Extremities:No edema. No cyanosis. Full range of motion in all extremities.  Skin:Skin is warm and dry. No diaphoresis. No rash.   Neurological:Alert, awake, and appropriate. Normal speech. Motor grossly normal. Cranial Nerves grossly intact by observation.   Psychiatric:Good eye contact. Normal interaction, affect, and behavior.    MEDICAL DECISION MAKING     DISCUSSION    74 y.o. femalewith PMH breast cancer s/p right mastectomy, HTN, HLD, vertigo presents c/o bilateral breast pain. EKGwithout ischemic changes, troponin negative. There are no alternative diagnoses necessitating hospital admission, there are no identified barriers to outpatient follow up, the HEART score is 0-3with indication for 1 troponin level. CXR unremarkable. 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 CTA findings d/w and instructed to f/u as outpatient for further eval/management by specialist. Will d/c home.Cardiology referral included in discharge.         The patient is NOT septic.  All labs and vital signs from the current visit have been reviewed and any abnormality that is present is not due to sepsis.    Vital Signs: Reviewed the patient's vital signs.   Nursing Notes: Reviewed and utilized available nursing notes.  Medical  Records Reviewed: Reviewed available past medical records.  Counseling: The emergency provider has spoken with the patient and discussed today's findings, in addition to providing specific details for the plan of care.  Questions are answered and there is agreement with the plan.      MIPS DOCUMENTATION        CARDIAC STUDIES    The following cardiac studies were independently interpreted by the Emergency Medicine Physician.  For full cardiac study results please see chart.    Monitor Strip  Interpreted by ED Physician  Rate: 68  Rhythm: NSR   ST Changes: none    EKG Interpretation:  Signed and interpreted byED Physician   Time Interpreted: 13:30  Comparison: unchanged from previous EKG January 11, 2019  Rate: 68  Rhythm: NSR  Axis: normal  Intervals: normal  ST segments: no ST elevation/depresion  Interpretation: Nonspecific  EKG    EMERGENCY IMAGING STUDIES    The following imagine studies were independently interpreted by me (emergency physician):    Radiology:  Interpreted by me (ED Physician)  Study: Chest Xray   Results: No infiltrate. No pneumothorax. No hemothorax. No cardiomegaly. No CHF.  Impression: No acute intrathoracic abnormality.  RADIOLOGY IMAGING STUDIES      CT Angio  AAA  Chest/ Abdomen   Final Result         1.  The aorta is normal in caliber. No dissection is seen.      2.  Sequelae from prior granulomatous process are noted.      3.  There is atelectasis in the lower lobes, bilaterally.      4.  There is aneurysmal dilatation of the suprahepatic inferior vena   cava. It measures up to 3.7 cm in diameter.      5.  Bilateral renal cysts are noted. There are additional hypodense foci   in the kidneys that are too small to definitely characterize.      6.  There is a hypodense focus in the left lobe of the liver that is too   small to definitely characterize.      7.  No free fluid or inflammatory changes are seen in the abdomen.                  Tana Felts, MD    12/28/2019 4:25 PM      Chest AP  Portable   Final Result    No acute process seen. Stable exam      Charlene Brooke, MD    12/28/2019 1:41 PM            PULSE OXIMETRY    Oxygen Saturation by Pulse Oximetry: 100%  Interventions: none  Interpretation:  Normal    EMERGENCY DEPT. MEDICATIONS      ED Medication Orders (From admission, onward)    Start Ordered     Status Ordering Provider    12/28/19 1704 12/28/19 1703  hydrALAZINE (APRESOLINE) injection 10 mg  Once     Route: Intravenous  Ordered Dose: 10 mg     Last MAR action: Given Shanda Bumps [facility]    12/28/19 1542 12/28/19 1543  iohexol (OMNIPAQUE) 350 MG/ML injection 100 mL  IMG once as needed     Route: Intravenous  Ordered Dose: 100 mL     Last MAR action: Imaging Agent Given Shawna Wearing [facility]          LABORATORY RESULTS    Ordered and independently interpreted AVAILABLE laboratory tests. Please see results section in chart for full details.  Results for orders placed or performed during the hospital encounter of 12/28/19   CBC and differential   Result Value Ref Range    WBC 7.23 3.10 - 9.50 x10 3/uL    Hgb 12.7 11.4 - 14.8 g/dL    Hematocrit 08.6 57.8 - 43.7 %    Platelets 258 142 - 346 x10 3/uL    RBC 4.72 3.90 - 5.10 x10 6/uL    MCV 87.7 78.0 - 96.0 fL    MCH 26.9 25.1 - 33.5 pg    MCHC 30.7 (L) 31.5 - 35.8 g/dL    RDW 13 11 - 15 %    MPV 11.4 8.9 - 12.5 fL    Neutrophils 72.0 None %    Lymphocytes Automated 20.9 None %    Monocytes 6.1 None %    Eosinophils Automated 0.3 None %    Basophils Automated 0.4 None %    Immature Granulocytes 0.3 None %    Nucleated RBC 0.0 0.0 - 0.0 /100 WBC    Neutrophils Absolute 5.21 1.10 - 6.33 x10 3/uL    Lymphocytes Absolute Automated 1.51 0.42 - 3.22 x10 3/uL  Monocytes Absolute Automated 0.44 0.21 - 0.85 x10 3/uL    Eosinophils Absolute Automated 0.02 0.00 - 0.44 x10 3/uL    Basophils Absolute Automated 0.03 0.00 - 0.08 x10 3/uL    Immature Granulocytes Absolute 0.02 0.00 - 0.07 x10 3/uL    Absolute NRBC 0.00 0.00 - 0.00 x10 3/uL    Troponin I   Result Value Ref Range    Troponin I <0.01 0.00 - 0.05 ng/mL   Comprehensive metabolic panel   Result Value Ref Range    Glucose 98 70 - 100 mg/dL    BUN 16 7 - 19 mg/dL    Creatinine 0.8 0.6 - 1.0 mg/dL    Sodium 846 962 - 952 mEq/L    Potassium 4.2 3.5 - 5.1 mEq/L    Chloride 103 100 - 111 mEq/L    CO2 29 22 - 29 mEq/L    Calcium 9.2 7.9 - 10.2 mg/dL    Protein, Total 6.6 6.0 - 8.3 g/dL    Albumin 4.1 3.5 - 5.0 g/dL    AST (SGOT) 18 5 - 34 U/L    ALT 14 0 - 55 U/L    Alkaline Phosphatase 81 37 - 106 U/L    Bilirubin, Total 0.4 0.2 - 1.2 mg/dL    Globulin 2.5 2.0 - 3.6 g/dL    Albumin/Globulin Ratio 1.6 0.9 - 2.2    Anion Gap 10.0 5.0 - 15.0   GFR   Result Value Ref Range    EGFR >60.0    ECG 12 Lead   Result Value Ref Range    Ventricular Rate 68 BPM    Atrial Rate 68 BPM    P-R Interval 164 ms    QRS Duration 88 ms    Q-T Interval 374 ms    QTC Calculation (Bezet) 397 ms    P Axis 52 degrees    R Axis 30 degrees    T Axis 18 degrees   ECG 12 lead   Result Value Ref Range    Ventricular Rate 88 BPM    Atrial Rate 88 BPM    P-R Interval 192 ms    QRS Duration 68 ms    Q-T Interval 358 ms    QTC Calculation (Bezet) 433 ms    P Axis 72 degrees    R Axis 65 degrees    T Axis 81 degrees       CRITICAL CARE/PROCEDURES    Procedures      DIAGNOSIS      Diagnosis:  Final diagnoses:   Breast pain       Disposition:  ED Disposition     ED Disposition Condition Date/Time Comment    Discharge  Sat Dec 28, 2019  4:49 PM Emily [name] discharge to home/self care.    Condition at disposition: Stable          Prescriptions:  Discharge Medication List as of 12/28/2019  5:23 PM      CONTINUE these medications which have NOT CHANGED    Details   amLODIPine (NORVASC) 5 MG tablet Take 1 tablet (5 mg total) by mouth daily, Starting Tue 10/15/2019, Until Mon 01/13/2020, E-Rx      anastrozole (ARIMIDEX) 1 MG tablet Take 1 tablet (1 mg total) by mouth daily, Starting Fri 01/19/2018, Normal      aspirin 81 MG EC tablet Take 81  mg by mouth every morning    , Historical Med      ibuprofen (ADVIL) 600  MG tablet Take 1 tablet (600 mg total) by mouth every 6 (six) hours as needed for Pain, Starting Thu 08/08/2019, E-Rx      levothyroxine (SYNTHROID, LEVOTHROID) 25 MCG tablet Take 25 mcg by mouth every morning.    , Starting Wed 12/25/2013, Historical Med      lisinopril (ZESTRIL) 20 MG tablet Take 1 tablet (20 mg total) by mouth 2 (two) times daily, Starting Wed 04/17/2019, Normal      meclizine (ANTIVERT) 25 MG tablet Take 1 tablet (25 mg total) by mouth 3 (three) times daily as needed for Dizziness, Starting Thu 08/08/2019, E-Rx      Multiple Vitamins-Minerals (MULTIVITAMIN WITH MINERALS) tablet Take 1 tablet by mouth every morning    , Historical Med      rosuvastatin (CRESTOR) 40 MG tablet Take 0.5 tablets (20 mg total) by mouth daily, Starting Wed 04/17/2019, Normal      simvastatin (ZOCOR) 40 MG tablet TAKE 1 TABLET BY MOUTH ONCE DAILY IN THE MORNING, Normal      vitamin B-1 (THIAMINE) 100 MG tablet Take 100 mg by mouth every morning., Historical Med                Shanda Bumps [place], [place]  12/28/19 2135

## 2019-12-28 NOTE — ED Triage Notes (Signed)
Patient reports to the ED with bilateral breast pain that started this morning around 9am when she woke up. She describes it as a sharp pain that radiates throught to her back and increases with movement. +dizziness and nausea. History of breast cancer with a right mastectomy.

## 2020-01-27 ENCOUNTER — Observation Stay
Admission: EM | Admit: 2020-01-27 | Discharge: 2020-01-29 | Disposition: A | Discharge status: Home / Self Care | Payer: No Typology Code available for payment source | Attending: Internal Medicine | Admitting: Internal Medicine

## 2020-01-27 ENCOUNTER — Emergency Department: Payer: No Typology Code available for payment source

## 2020-01-27 ENCOUNTER — Encounter (INDEPENDENT_AMBULATORY_CARE_PROVIDER_SITE_OTHER): Payer: Self-pay

## 2020-01-27 DIAGNOSIS — R9082 White matter disease, unspecified: Secondary | ICD-10-CM | POA: Insufficient documentation

## 2020-01-27 DIAGNOSIS — R202 Paresthesia of skin: Secondary | ICD-10-CM | POA: Insufficient documentation

## 2020-01-27 DIAGNOSIS — R200 Anesthesia of skin: Secondary | ICD-10-CM | POA: Insufficient documentation

## 2020-01-27 DIAGNOSIS — K219 Gastro-esophageal reflux disease without esophagitis: Secondary | ICD-10-CM | POA: Insufficient documentation

## 2020-01-27 DIAGNOSIS — Z7982 Long term (current) use of aspirin: Secondary | ICD-10-CM | POA: Insufficient documentation

## 2020-01-27 DIAGNOSIS — Z79899 Other long term (current) drug therapy: Secondary | ICD-10-CM | POA: Insufficient documentation

## 2020-01-27 DIAGNOSIS — R531 Weakness: Secondary | ICD-10-CM | POA: Insufficient documentation

## 2020-01-27 DIAGNOSIS — R0789 Other chest pain: Principal | ICD-10-CM | POA: Insufficient documentation

## 2020-01-27 DIAGNOSIS — Z9011 Acquired absence of right breast and nipple: Secondary | ICD-10-CM | POA: Insufficient documentation

## 2020-01-27 DIAGNOSIS — Z885 Allergy status to narcotic agent status: Secondary | ICD-10-CM | POA: Insufficient documentation

## 2020-01-27 DIAGNOSIS — Z20822 Contact with and (suspected) exposure to covid-19: Secondary | ICD-10-CM | POA: Insufficient documentation

## 2020-01-27 DIAGNOSIS — E039 Hypothyroidism, unspecified: Secondary | ICD-10-CM | POA: Insufficient documentation

## 2020-01-27 DIAGNOSIS — R1310 Dysphagia, unspecified: Secondary | ICD-10-CM | POA: Insufficient documentation

## 2020-01-27 DIAGNOSIS — Z853 Personal history of malignant neoplasm of breast: Secondary | ICD-10-CM | POA: Insufficient documentation

## 2020-01-27 DIAGNOSIS — I10 Essential (primary) hypertension: Secondary | ICD-10-CM | POA: Insufficient documentation

## 2020-01-27 DIAGNOSIS — M79601 Pain in right arm: Secondary | ICD-10-CM | POA: Insufficient documentation

## 2020-01-27 DIAGNOSIS — E785 Hyperlipidemia, unspecified: Secondary | ICD-10-CM | POA: Insufficient documentation

## 2020-01-27 DIAGNOSIS — R0602 Shortness of breath: Secondary | ICD-10-CM | POA: Insufficient documentation

## 2020-01-27 DIAGNOSIS — M79604 Pain in right leg: Secondary | ICD-10-CM | POA: Insufficient documentation

## 2020-01-27 LAB — URINALYSIS REFLEX TO MICROSCOPIC EXAM - REFLEX TO CULTURE
Bilirubin, UA: NEGATIVE
Blood, UA: NEGATIVE
Glucose, UA: NEGATIVE
Ketones UA: NEGATIVE
Leukocyte Esterase, UA: NEGATIVE
Nitrite, UA: NEGATIVE
Protein, UR: NEGATIVE
Specific Gravity UA: 1.02 (ref 1.001–1.035)
Urine pH: 7 (ref 5.0–8.0)
Urobilinogen, UA: NEGATIVE mg/dL (ref 0.2–2.0)

## 2020-01-27 LAB — CBC AND DIFFERENTIAL
Absolute NRBC: 0 10*3/uL (ref 0.00–0.00)
Basophils Absolute Automated: 0.04 10*3/uL (ref 0.00–0.08)
Basophils Automated: 0.6 %
Eosinophils Absolute Automated: 0.11 10*3/uL (ref 0.00–0.44)
Eosinophils Automated: 1.7 %
Hematocrit: 32.9 % — ABNORMAL LOW (ref 34.7–43.7)
Hgb: 10.2 g/dL — ABNORMAL LOW (ref 11.4–14.8)
Immature Granulocytes Absolute: 0.01 10*3/uL (ref 0.00–0.07)
Immature Granulocytes: 0.2 %
Lymphocytes Absolute Automated: 1.92 10*3/uL (ref 0.42–3.22)
Lymphocytes Automated: 29.8 %
MCH: 27.4 pg (ref 25.1–33.5)
MCHC: 31 g/dL — ABNORMAL LOW (ref 31.5–35.8)
MCV: 88.4 fL (ref 78.0–96.0)
MPV: 11.4 fL (ref 8.9–12.5)
Monocytes Absolute Automated: 0.58 10*3/uL (ref 0.21–0.85)
Monocytes: 9 %
Neutrophils Absolute: 3.79 10*3/uL (ref 1.10–6.33)
Neutrophils: 58.7 %
Nucleated RBC: 0 /100 WBC (ref 0.0–0.0)
Platelets: 251 10*3/uL (ref 142–346)
RBC: 3.72 10*6/uL — ABNORMAL LOW (ref 3.90–5.10)
RDW: 13 % (ref 11–15)
WBC: 6.45 10*3/uL (ref 3.10–9.50)

## 2020-01-27 LAB — TROPONIN I: Troponin I: <0.01 ng/mL (ref 0.00–0.05)

## 2020-01-27 LAB — ECG 12-LEAD
Atrial Rate: 61 {beats}/min
P Axis: 42 degrees
P-R Interval: 202 ms
Q-T Interval: 412 ms
QRS Duration: 96 ms
QTC Calculation (Bezet): 414 ms
R Axis: 38 degrees
T Axis: 26 degrees
Ventricular Rate: 61 {beats}/min

## 2020-01-27 LAB — COMPREHENSIVE METABOLIC PANEL
ALT: 10 U/L (ref 0–55)
AST (SGOT): 19 U/L (ref 5–34)
Albumin/Globulin Ratio: 1.7 (ref 0.9–2.2)
Albumin: 3.3 g/dL — ABNORMAL LOW (ref 3.5–5.0)
Alkaline Phosphatase: 62 U/L (ref 37–106)
Anion Gap: 7 (ref 5.0–15.0)
BUN: 11 mg/dL (ref 7–19)
Bilirubin, Total: 0.3 mg/dL (ref 0.2–1.2)
CO2: 26 mEq/L (ref 22–29)
Calcium: 8.1 mg/dL (ref 7.9–10.2)
Chloride: 105 mEq/L (ref 100–111)
Creatinine: 0.8 mg/dL (ref 0.6–1.0)
Globulin: 2 g/dL (ref 2.0–3.6)
Glucose: 101 mg/dL — ABNORMAL HIGH (ref 70–100)
Potassium: 4 mEq/L (ref 3.5–5.1)
Protein, Total: 5.3 g/dL — ABNORMAL LOW (ref 6.0–8.3)
Sodium: 138 mEq/L (ref 136–145)

## 2020-01-27 LAB — GFR: EGFR: >60

## 2020-01-27 LAB — COVID-19 (SARS-COV-2)
SARS CoV 2 Overall Result: INVALID
SARS CoV 2 Overall Result: NEGATIVE

## 2020-01-27 LAB — GLUCOSE WHOLE BLOOD - POCT: Whole Blood Glucose POCT: 125 mg/dL — ABNORMAL HIGH (ref 70–100)

## 2020-01-27 MED ORDER — ACETAMINOPHEN 650 MG RE SUPP
650.00 mg | Freq: Four times a day (QID) | RECTAL | Status: DC | PRN
Start: 2020-01-27 — End: 2020-01-29

## 2020-01-27 MED ORDER — ONDANSETRON HCL 4 MG/2ML IJ SOLN
4.00 mg | Freq: Four times a day (QID) | INTRAMUSCULAR | Status: DC | PRN
Start: 2020-01-27 — End: 2020-01-29

## 2020-01-27 MED ORDER — ONDANSETRON 4 MG PO TBDP
4.00 mg | ORAL_TABLET | Freq: Four times a day (QID) | ORAL | Status: DC | PRN
Start: 2020-01-27 — End: 2020-01-29

## 2020-01-27 MED ORDER — PANTOPRAZOLE SODIUM 40 MG PO TBEC
40.00 mg | DELAYED_RELEASE_TABLET | Freq: Every morning | ORAL | Status: DC
Start: 2020-01-27 — End: 2020-01-29
  Administered 2020-01-28 – 2020-01-29 (×2): 40 mg via ORAL
  Filled 2020-01-27 (×2): qty 1

## 2020-01-27 MED ORDER — LEVOTHYROXINE SODIUM 25 MCG PO TABS
25.0000 ug | ORAL_TABLET | Freq: Every day | ORAL | Status: DC
Start: 2020-01-28 — End: 2020-01-29
  Administered 2020-01-28 – 2020-01-29 (×2): 25 ug via ORAL
  Filled 2020-01-27 (×2): qty 1

## 2020-01-27 MED ORDER — TRAZODONE HCL 50 MG PO TABS
50.0000 mg | ORAL_TABLET | Freq: Every evening | ORAL | Status: DC | PRN
Start: 2020-01-27 — End: 2020-01-29

## 2020-01-27 MED ORDER — ROSUVASTATIN CALCIUM 10 MG PO TABS
20.0000 mg | ORAL_TABLET | Freq: Every day | ORAL | Status: DC
Start: 2020-01-28 — End: 2020-01-29
  Administered 2020-01-28 – 2020-01-29 (×2): 20 mg via ORAL
  Filled 2020-01-27 (×2): qty 2

## 2020-01-27 MED ORDER — MECLIZINE HCL 12.5 MG PO TABS
25.0000 mg | ORAL_TABLET | Freq: Three times a day (TID) | ORAL | Status: DC | PRN
Start: 2020-01-27 — End: 2020-01-29

## 2020-01-27 MED ORDER — ASPIRIN 325 MG PO TABS
325.0000 mg | ORAL_TABLET | Freq: Every day | ORAL | Status: AC
Start: 2020-01-27 — End: 2020-01-28

## 2020-01-27 MED ORDER — HEPARIN SODIUM (PORCINE) 5000 UNIT/ML IJ SOLN
5000.00 [IU] | Freq: Two times a day (BID) | INTRAMUSCULAR | Status: DC
Start: 2020-01-27 — End: 2020-01-29
  Administered 2020-01-28 – 2020-01-29 (×4): 5000 [IU] via SUBCUTANEOUS
  Filled 2020-01-27 (×4): qty 1

## 2020-01-27 MED ORDER — IODIXANOL 320 MG/ML IV SOLN
150.00 mL | Freq: Once | INTRAVENOUS | Status: AC | PRN
Start: 2020-01-27 — End: 2020-01-27
  Administered 2020-01-27: 150 mL via INTRAVENOUS

## 2020-01-27 MED ORDER — LISINOPRIL 20 MG PO TABS
20.0000 mg | ORAL_TABLET | Freq: Two times a day (BID) | ORAL | Status: DC
Start: 2020-01-27 — End: 2020-01-29
  Administered 2020-01-27 – 2020-01-29 (×4): 20 mg via ORAL
  Filled 2020-01-27 (×4): qty 1

## 2020-01-27 MED ORDER — IBUPROFEN 400 MG PO TABS
600.0000 mg | ORAL_TABLET | Freq: Four times a day (QID) | ORAL | Status: DC | PRN
Start: 2020-01-27 — End: 2020-01-29

## 2020-01-27 MED ORDER — ACETAMINOPHEN 325 MG PO TABS
650.0000 mg | ORAL_TABLET | Freq: Four times a day (QID) | ORAL | Status: DC | PRN
Start: 2020-01-27 — End: 2020-01-29
  Administered 2020-01-28: 650 mg via ORAL
  Filled 2020-01-27: qty 2

## 2020-01-27 MED ORDER — THIAMINE (VITAMIN B1) 100 MG PO TABS (WRAP)
100.0000 mg | ORAL_TABLET | Freq: Every morning | ORAL | Status: DC
Start: 2020-01-28 — End: 2020-01-29
  Administered 2020-01-28 – 2020-01-29 (×2): 100 mg via ORAL
  Filled 2020-01-27 (×2): qty 1

## 2020-01-27 MED ORDER — MAGNESIUM HYDROXIDE 400 MG/5ML PO SUSP
30.00 mL | Freq: Every day | ORAL | Status: DC | PRN
Start: 2020-01-27 — End: 2020-01-29

## 2020-01-27 MED ORDER — NALOXONE HCL 0.4 MG/ML IJ SOLN (WRAP)
0.20 mg | INTRAMUSCULAR | Status: DC | PRN
Start: 2020-01-27 — End: 2020-01-29

## 2020-01-27 MED ORDER — CALCIUM CARBONATE ANTACID 500 MG PO CHEW
1000.00 mg | CHEWABLE_TABLET | Freq: Four times a day (QID) | ORAL | Status: DC | PRN
Start: 2020-01-27 — End: 2020-01-29

## 2020-01-27 NOTE — ED Notes (Signed)
Patient did not tolerate first COVID-swab, results came back invalid. Patient refused secondary COVID-swab.

## 2020-01-27 NOTE — ED Notes (Signed)
[facility]  ED NURSING NOTE FOR THE RECEIVING INPATIENT NURSE   ED NURSE Sigmund Hazel [phone]   ED CHARGE RN Suzanna   ADMISSION INFORMATION   Emily [name] is a 74 y.o. female admitted with an ED diagnosis of:    1. Left-sided weakness         Isolation: None   Allergies: Caffeine, Morphine, and Morphine and related   Holding Orders confirmed? Yes   Belongings Documented? Yes   Home medications sent to pharmacy confirmed? N/A   NURSING CARE   Patient Comes From:   Mental Status: Home Independent  alert and oriented   ADL: Needs assistance with ADLs   Ambulation: 1 person assist   Pertinent Information  and Safety Concerns: N/A      CT / NIH   CT Head ordered on this patient?  Yes   NIH/Dysphagia assessment done prior to admission? Yes   VITAL SIGNS (at the time of this note)      Vitals:    01/27/20 1621   BP: 149/67   Pulse: (!) 46   Resp: 17   Temp: 98 F (36.7 C)   SpO2: 99%

## 2020-01-27 NOTE — ED Notes (Signed)
Patient refused meal tray, endorsed she is not hungry.

## 2020-01-27 NOTE — ED Notes (Addendum)
[facility]   ED Nursing Note For The Receiving Inpatient Nurse - UPDATE     The following information is an update to the previous ED Nursing Admission Note:    Pt resting comfortably in her room. Pt is still bradycardic but still AOx4. I scored an NIH of 4 compared to the initial NIH of 8.      VITAL SIGNS     Vitals:    01/27/20 2001   BP: 148/67   Pulse: (!) 55   Resp: 17   Temp: 98.4 F (36.9 C)   SpO2: 98%

## 2020-01-27 NOTE — Consults (Addendum)
NEUROLOGY CONSULTATION    Date Time: 01/27/20 5:32 PM  Patient Name: Emily [name]  Attending Physician: Laveda Norman, MD      Assessment & Plan:   New onset of left face arm and leg numbness, ongoing since yesterday, with some worsening today unclear cause patient has had similar presentations in the past with negative work-up  Chest pain   Unfortunately given focal symptoms, will repeat MRI   Aspirin for now   If indeed new ischemia seen further recommendations will be given   Further recommendations after MRI has been done    History of Present Illness:   Patient is a 74 year old lady comes into the hospital because of new onset of numbness involving the left side of her face arm and the leg that started yesterday afternoon and today her symptoms progressively worsened associated with chest pain and shortness of breath and she decided to come to the hospital.  When I asked her if this is happened to her before she says she has never had the symptoms previously even though she has been admitted to this hospital multiple times for similar presentation    CT angios CT perfusion are negative    Past Medical History:     Past Medical History:   Diagnosis Date   . Breast cancer 2014    right breast mastectomy   . GERD (gastroesophageal reflux disease)    . Hyperlipidemia    . Hypertension    . Hypothyroidism    . Malignant neoplasm of overlapping sites of right female breast 10/19/2015   . Vertigo        Meds:   MedX aspirin Advil Synthroid Zestril Antivert Zocor    Allergies   Allergen Reactions   . Caffeine      Personal preference   . Morphine Itching   . Morphine And Related Itching and Nausea And Vomiting     dizzy       Social & Family History:     Social History     Socioeconomic History   . Marital status: Widowed     Spouse name: Not on file   . Number of children: Not on file   . Years of education: Not on file   . Highest education level: Not on file   Occupational History   . Not on file   Tobacco Use    . Smoking status: Never Smoker   . Smokeless tobacco: Never Used   Substance and Sexual Activity   . Alcohol use: Yes     Alcohol/week: 1.0 standard drinks     Types: 1 Cans of beer per week     Comment: socially   . Drug use: No   . Sexual activity: Not on file   Other Topics Concern   . Not on file   Social History Narrative   . Not on file     Social Determinants of Health     Financial Resource Strain:    . Difficulty of Paying Living Expenses:    Food Insecurity:    . Worried About Programme researcher, broadcasting/film/video in the Last Year:    . Barista in the Last Year:    Transportation Needs:    . Freight forwarder (Medical):    Marland Kitchen Lack of Transportation (Non-Medical):    Physical Activity:    . Days of Exercise per Week:    . Minutes of Exercise per Session:    Stress:    .  Feeling of Stress :    Social Connections:    . Frequency of Communication with Friends and Family:    . Frequency of Social Gatherings with Friends and Family:    . Attends Religious Services:    . Active Member of Clubs or Organizations:    . Attends Banker Meetings:    Marland Kitchen Marital Status:    Intimate Partner Violence:    . Fear of Current or Ex-Partner:    . Emotionally Abused:    Marland Kitchen Physically Abused:    . Sexually Abused:        Family History   Problem Relation Age of Onset   . Breast cancer Neg Hx            CODE STATUS: Full code no elder abuse no falls non-smoker    Review of Systems:   No headache, eye, ear nose, throat problems; no coughing or wheezing or shortness of breath, No chest pain or orthopnea, no abdominal pain, nausea or vomiting, No pain in the body or extremities, no psychiatric, neurological, endocrine, hematological or cardiac complaints except as noted above.  Has  chest pain denies neck pain    Physical Exam:   Blood pressure 157/70, pulse (!) 47, temperature 98 F (36.7 C), temperature source Oral, resp. rate 17, height 1.6 m (5\' 3" ), weight 75.7 kg (166 lb 14.2 oz), SpO2 99 %.    HEENT: Normocephalic.no  carotid bruits  Lungs:  CTA bil  Abd Soft   Cardiac:  S1,S2, normal rate and rhythm  Neck: supple, no cartoid bruits  Extremities: no edema  Skin: no rashes seen in exposed areas     Neuro:  Level of consciousness:  Alert and appropriate  Oriented:  X 3  Cognition:  Intact naming, recognition, concentration and following complex commands  Cranial Nerves:  II-XII intact  Strength:  ?  Minimal drift in the left upper extremity, good strength in both lower extremities  coordination:  Intact FTN testing  Reflexes:  +1 throughout, down going toes bil  Sensation: Reduced light touch temperature left face arm and the leg  Labs:     Recent Labs   Lab 01/27/20  1532   Glucose 101*   BUN 11   Creatinine 0.8   Calcium 8.1   Sodium 138   Potassium 4.0   Chloride 105   CO2 26   Albumin 3.3*   AST (SGOT) 19   ALT 10   Bilirubin, Total 0.3   Alkaline Phosphatase 62     Recent Labs   Lab 01/27/20  1531   WBC 6.45   Hgb 10.2*   Hematocrit 32.9*   MCV 88.4   MCH 27.4   MCHC 31.0*   Platelets 251         No results for input(s): PTT, PT, INR in the last 72 hours.       Radiology Results (24 Hour)     Procedure Component Value Units Date/Time    CTA  Head & Neck [[id number]] Collected: 01/27/20 1547    Order Status: Completed Updated: 01/27/20 1552    Narrative:      HISTORY: TIA OR CVA    TECHNIQUE: Enhanced computed tomography angiography of the head and neck  was performed at 0.65 mm. Maximum intensity projection and surface  shaded 3d reconstruction were performed on the Vitrea station.  Assessment of clinically significant stenosis based on NASCET method  which calculates the degree of stenosis with reference to  the lumen of  the carotid artery distal to a stenosis.      Contrast dose: 100 cc Visipaque 320    PRIORS: There are no prior studies for comparison.    FINDINGS:   Head: There is no aneurysmal dilatation or stenosis involving the  cavernous and supraclinoid portions of the internal carotid arteries.  The internal and  middle cerebral arteries are unremarkable.  Evaluation of the posterior circulation show the intradural portion of  the vertebral arteries to be normal as is the basilar and posterior  cerebral arteries.    Neck: The aortic arch in unremarkable. CT angiography of the neck  demonstrates no evidence of hemodynamically significant stenosis of the  common and internal carotid arteries in the neck.     Evaluation of the posterior circulation shows the vertebral arteries to  be normal in caliber and course from their origins to the  vertebrobasilar junction.  There are degenerative changes of the spine.    Impression:       No discrete high-grade extracranial or intracranial stenosis  or occlusion.      Note: Note that CT scanning at this site utilizes multiple dose  reduction techniques including automatic exposure control, adjustment of  the MAA and/or KVP according to patient's size and use of iterative  reconstruction technique    Laurena Slimmer, MD   01/27/2020 3:50 PM    CT Perfusion Brain [[id number]] Collected: 01/27/20 1546    Order Status: Completed Updated: 01/27/20 1549    Narrative:      HISTORY: left sided weakness      TECHNIQUE:  Two (2) scout scans were obtained through the level of the  basal ganglia.  During administration of a bolus of 40 cc nonionic  contrast, dynamic imaging was obtained at a rate of one image per second  for 40 seconds.  The data set was reconstructed and maps were generated  with determination of cerebral flow, cerebral blood volume and  peak-to-peak transit map    INTERPRETATION: The cortical flow and blood volume perfusion is normal  and symmetrical. The time-to-peak map demonstrates a homogeneous map  with no areas of increased transit.      Impression:       Normal study with no evidence of cerebral ischemia or  infarction of the major cerebral territories.    Laurena Slimmer, MD   01/27/2020 3:47 PM    CT Head WO Contrast [[id number]] Collected: 01/27/20 1507    Order Status: Completed  Updated: 01/27/20 1517    Narrative:      CT HEAD WO CONTRAST    CLINICAL INDICATION:   Neuro deficit, acute, stroke suspected    COMPARISON: 08/08/2019    TECHNIQUE: 5 mm axial images from the skull base to the vertex. The  following ?dose reduction techniques were utilized: automated exposure  control and/or adjustment of the mA and/or kV according to patient  size, and the use of iterative reconstruction technique.    FINDINGS:     The ventricles, cisterns, and sulci appear within normal size limits.  There is no mass effect or midline shift. There is no hemorrhage or  abnormal extra-axial fluid collection. The gray-white differentiation  appears maintained. Bone windows demonstrate no evidence for acute  osseous abnormality. The included paranasal sinuses and mastoid air  cells appear clear. There are atherosclerotic vascular calcifications.      Impression:       No acute intracranial process.    Sandie Ano, MD  01/27/2020 3:15 PM           All recent brain and spine imaging (MRI, CT) personally reviewed.    Chart reviewed    Case discussed with: Patient, ED    This note was generated by the Epic EMR system/Speech recognition and may contain inherent errors or omissions not intended by the user. Grammatical errors, random word insertions, deletions and pronoun errors  are occasional consequences of this technology due to software limitations.   Not all errors are caught or corrected. If there are questions or concerns about the content of this note or information contained within the body of this dictation they should be addressed directly with the author for clarification.    Signed by: Cathe Mons, MD  Spectralink: [id number]      Answering Service: [phone]

## 2020-01-27 NOTE — H&P (Signed)
ADMISSION HISTORY AND PHYSICAL EXAM    Date Time: 01/27/20 4:48 PM  Patient Name: Emily [name]  Attending Physician: Laveda Norman, MD      History of Present Illness:   Emily [name] is a 74 y.o. female who presents to the hospital with   Chief Complaint   Patient presents with   . Numbness       Patient is a 74 year old female history of hypertension hyperlipidemia hypothyroidism history of previous breast cancer presents with left-sided tingling left arm left leg no syncope or near syncope no weakness patient also had some atypical chest pain reports took extra aspirin today patient reports no fever chills nausea vomiting diarrhea is on no new medications patient reports has been compliant with activity and diet      Past Medical History:     Past Medical History:   Diagnosis Date   . Breast cancer 2014    right breast mastectomy   . GERD (gastroesophageal reflux disease)    . Hyperlipidemia    . Hypertension    . Hypothyroidism    . Malignant neoplasm of overlapping sites of right female breast 10/19/2015   . Vertigo        Past Surgical History:     Past Surgical History:   Procedure Laterality Date   . HYSTERECTOMY     . MASTECTOMY Right 2015       Family History:     Family History   Problem Relation Age of Onset   . Breast cancer Neg Hx        Social History:     Social History     Socioeconomic History   . Marital status: Widowed     Spouse name: Not on file   . Number of children: Not on file   . Years of education: Not on file   . Highest education level: Not on file   Occupational History   . Not on file   Tobacco Use   . Smoking status: Never Smoker   . Smokeless tobacco: Never Used   Substance and Sexual Activity   . Alcohol use: Yes     Alcohol/week: 1.0 standard drinks     Types: 1 Cans of beer per week     Comment: socially   . Drug use: No   . Sexual activity: Not on file   Other Topics Concern   . Not on file   Social History Narrative   . Not on file     Social Determinants of Health      Financial Resource Strain:    . Difficulty of Paying Living Expenses:    Food Insecurity:    . Worried About Programme researcher, broadcasting/film/video in the Last Year:    . Barista in the Last Year:    Transportation Needs:    . Freight forwarder (Medical):    Marland Kitchen Lack of Transportation (Non-Medical):    Physical Activity:    . Days of Exercise per Week:    . Minutes of Exercise per Session:    Stress:    . Feeling of Stress :    Social Connections:    . Frequency of Communication with Friends and Family:    . Frequency of Social Gatherings with Friends and Family:    . Attends Religious Services:    . Active Member of Clubs or Organizations:    . Attends Banker Meetings:    .  Marital Status:    Intimate Partner Violence:    . Fear of Current or Ex-Partner:    . Emotionally Abused:    Marland Kitchen Physically Abused:    . Sexually Abused:        Allergies:     Allergies   Allergen Reactions   . Caffeine      Personal preference   . Morphine Itching   . Morphine And Related Itching and Nausea And Vomiting     dizzy       Medications:   (Not in a hospital admission)      Review of Systems:    A comprehensive review of systems was: General ROS: negative for - chills, fever or night sweats  Psychological ROS: negative for - anxiety, depression, disorientation, hallucinations or suicidal ideation  ENT ROS: negative for - headaches, nasal congestion, sinus pain or visual changes  Allergy and Immunology ROS: negative for - itchy/watery eyes, nasal congestion or postnasal drip  Hematological and Lymphatic ROS: negative for - bleeding problems, bruising, pallor or weight loss  Endocrine ROS: negative for - malaise/lethargy or polydipsia/polyuria  Respiratory ROS: negative for - cough, orthopnea, shortness of breath or wheezing  Cardiovascular ROS some nonexertional chest discomfort no jaw or arm pain no syncope or near syncope   gastrointestinal ROS: negative for - abdominal pain, constipation, diarrhea or  nausea/vomiting  Genito-Urinary ROS: negative for - change in urinary stream, dysuria, hematuria or nocturia  Musculoskeletal ROS: negative for - joint pain, joint stiffness or joint swelling  Neurological ROS: Positive for left arm and leg tingling no weakness no change in vision or change in speech no syncope or near syncope patient is awake and alert  Dermatological ROS: negative for pruritus, rash and skin lesion changes      Physical Exam:     Vitals:    01/27/20 1640   BP: 167/72   Pulse: (!) 48   Resp: 19   Temp:    SpO2: 99%       Intake and Output Summary (Last 24 hours) at Date Time  No intake or output data in the 24 hours ending 01/27/20 1648     General appearance -age-appropriate  Mental status - alert, oriented to person, place, and time  Eyes - pupils equal and reactive, extraocular eye movements intact  Ears -  external ear canals normal  Nose - normal and patent, no erythema, discharge or polyps  Mouth - mucous membranes moist, pharynx normal without lesions  Neck - supple, no significant adenopathy  Lymphatics - no palpable lymphadenopathy, no hepatosplenomegaly  Chest - clear to auscultation, no wheezes, rales or rhonchi, symmetric air entry  Heart - normal rate, regular rhythm, normal S1, S2, no murmurs, rubs, clicks or gallops  Abdomen - soft, nontender, nondistended, no masses or organomegaly  Neurological - alert, oriented, normal speech, no focal findings or movement disorder noted  Musculoskeletal - no joint tenderness, deformity or swelling  Extremities - peripheral pulses normal, no pedal edema, no clubbing or cyanosis  Skin - normal coloration and turgor, no rashes, no suspicious skin lesions noted    Labs:     Recent Labs   Lab 01/27/20  1532   Sodium 138   Potassium 4.0   Chloride 105   CO2 26   BUN 11   Creatinine 0.8   Calcium 8.1   Albumin 3.3*   Protein, Total 5.3*   Bilirubin, Total 0.3   Alkaline Phosphatase 62   ALT 10  AST (SGOT) 19   Glucose 101*     Recent Labs   Lab  01/27/20  1531   WBC 6.45   Hgb 10.2*   Hematocrit 32.9*   Platelets 251           Rads:   Radiological Procedure reviewed.  Radiology Results (24 Hour)     Procedure Component Value Units Date/Time    CTA  Head & Neck [[id number]] Collected: 01/27/20 1547    Order Status: Completed Updated: 01/27/20 1552    Narrative:      HISTORY: TIA OR CVA    TECHNIQUE: Enhanced computed tomography angiography of the head and neck  was performed at 0.65 mm. Maximum intensity projection and surface  shaded 3d reconstruction were performed on the Vitrea station.  Assessment of clinically significant stenosis based on NASCET method  which calculates the degree of stenosis with reference to the lumen of  the carotid artery distal to a stenosis.      Contrast dose: 100 cc Visipaque 320    PRIORS: There are no prior studies for comparison.    FINDINGS:   Head: There is no aneurysmal dilatation or stenosis involving the  cavernous and supraclinoid portions of the internal carotid arteries.  The internal and middle cerebral arteries are unremarkable.  Evaluation of the posterior circulation show the intradural portion of  the vertebral arteries to be normal as is the basilar and posterior  cerebral arteries.    Neck: The aortic arch in unremarkable. CT angiography of the neck  demonstrates no evidence of hemodynamically significant stenosis of the  common and internal carotid arteries in the neck.     Evaluation of the posterior circulation shows the vertebral arteries to  be normal in caliber and course from their origins to the  vertebrobasilar junction.  There are degenerative changes of the spine.    Impression:       No discrete high-grade extracranial or intracranial stenosis  or occlusion.      Note: Note that CT scanning at this site utilizes multiple dose  reduction techniques including automatic exposure control, adjustment of  the MAA and/or KVP according to patient's size and use of iterative  reconstruction  technique    Laurena Slimmer, MD   01/27/2020 3:50 PM    CT Perfusion Brain [[id number]] Collected: 01/27/20 1546    Order Status: Completed Updated: 01/27/20 1549    Narrative:      HISTORY: left sided weakness      TECHNIQUE:  Two (2) scout scans were obtained through the level of the  basal ganglia.  During administration of a bolus of 40 cc nonionic  contrast, dynamic imaging was obtained at a rate of one image per second  for 40 seconds.  The data set was reconstructed and maps were generated  with determination of cerebral flow, cerebral blood volume and  peak-to-peak transit map    INTERPRETATION: The cortical flow and blood volume perfusion is normal  and symmetrical. The time-to-peak map demonstrates a homogeneous map  with no areas of increased transit.      Impression:       Normal study with no evidence of cerebral ischemia or  infarction of the major cerebral territories.    Laurena Slimmer, MD   01/27/2020 3:47 PM    CT Head WO Contrast [[id number]] Collected: 01/27/20 1507    Order Status: Completed Updated: 01/27/20 1517    Narrative:      CT HEAD WO CONTRAST  CLINICAL INDICATION:   Neuro deficit, acute, stroke suspected    COMPARISON: 08/08/2019    TECHNIQUE: 5 mm axial images from the skull base to the vertex. The  following ?dose reduction techniques were utilized: automated exposure  control and/or adjustment of the mA and/or kV according to patient  size, and the use of iterative reconstruction technique.    FINDINGS:     The ventricles, cisterns, and sulci appear within normal size limits.  There is no mass effect or midline shift. There is no hemorrhage or  abnormal extra-axial fluid collection. The gray-white differentiation  appears maintained. Bone windows demonstrate no evidence for acute  osseous abnormality. The included paranasal sinuses and mastoid air  cells appear clear. There are atherosclerotic vascular calcifications.      Impression:       No acute intracranial process.    Sandie Ano,  MD   01/27/2020 3:15 PM          Assessment:     Patient is a 74 year old female with hypertension hyperlipidemia hypothyroidism GERD breast cancer with left-sided tingling and atypical chest pain    Condition guarded      Plan:     Admit to IMCU  Continue aspirin  Add pantoprazole  Resume outpatient medications  Check lipid panel  Review of CT scans  2D echo  Neurology consult  Trend troponins  Review EKG and telemetry monitoring  Monitor clinically        Signed by: Mechele Dawley, MD

## 2020-01-27 NOTE — Progress Notes (Signed)
Patient called and stated she is having chest and left shoulder pain radiating down her left arm. Patient encouraged to call 911 to be evaluated in the emergency room and patient agreed.

## 2020-01-27 NOTE — Progress Notes (Signed)
 [facility]  Stroke Team Evaluation     Code called on:   Date: 01/27/2020  Time Code Called: 14:50  74 y.o. female presenting with dizziness, chest pain, L sided numbness, weakness.     Last known well: 4/25 20:30  Initial NIH Stroke Scale: 8    Prior stroke: no  Patient on anticoagulation: no  Pre-stroke mRS:   Left or right handed:   List cardiovascular risk factors (htn, hld, dm, a-fib, tobacco, etc.): HTN, HLD, R mastectomy    Discussed with Dr. Odie Sera & Dr. Francesco Sor.    Emily Mahler, RN  01/27/2020 3:14 PM    [facility] ED Stroke RN / [phone]  [facility]patient Stroke RN / [phone]      *Modified Rankin Scale:  0 - No symptoms at all   1 - No significant disability despite symptoms; able to carry out all usual duties and activities  2 - Slight disability; unable to carry out all previous activities, but able to look after own affairs without assistance  3 - Moderate disability; requiring some help, but able to walk without assistance  4 - Moderately severe disability; unable to walk without assistance and unable to attend to own bodily needs without assistance  5 - Severe disability; bedridden, incontinent and requiring constant nursing care and attention  6 - Dead

## 2020-01-27 NOTE — ED Provider Notes (Signed)
History     Chief Complaint   Patient presents with   . Numbness     Emily [name] presents to the emergency room with a complaint of left-sided numbness and weakness.  She reports that her symptoms have been intermittent all day yesterday but became constant at 8:30 PM yesterday.  She reports trouble with her gait.  She also reports intermittent substernal chest pain since this morning.  Pain is sharp and radiates to her left arm.  She denies any exacerbating alleviating factors.  She reports associated dyspnea and nausea.           Past Medical History:   Diagnosis Date   . Breast cancer 2014    right breast mastectomy   . GERD (gastroesophageal reflux disease)    . Hyperlipidemia    . Hypertension    . Hypothyroidism    . Malignant neoplasm of overlapping sites of right female breast 10/19/2015   . Vertigo        Past Surgical History:   Procedure Laterality Date   . HYSTERECTOMY     . MASTECTOMY Right 2015       Family History   Problem Relation Age of Onset   . Breast cancer Neg Hx        Social  Social History     Tobacco Use   . Smoking status: Never Smoker   . Smokeless tobacco: Never Used   Substance Use Topics   . Alcohol use: Yes     Alcohol/week: 1.0 standard drinks     Types: 1 Cans of beer per week     Comment: socially   . Drug use: No       .     Allergies   Allergen Reactions   . Caffeine      Personal preference   . Morphine Itching   . Morphine And Related Itching and Nausea And Vomiting     dizzy       Home Medications     Med List Status: In Progress Set By: Scherrie November, RN at 01/27/2020 1544                anastrozole (ARIMIDEX) 1 MG tablet     Take 1 tablet (1 mg total) by mouth daily     Patient taking differently: Take 1 mg by mouth every morning         aspirin 81 MG EC tablet     Take 81 mg by mouth every morning         ibuprofen (ADVIL) 600 MG tablet     Take 1 tablet (600 mg total) by mouth every 6 (six) hours as needed for Pain     levothyroxine (SYNTHROID, LEVOTHROID) 25  MCG tablet     Take 25 mcg by mouth every morning.         lisinopril (ZESTRIL) 20 MG tablet     Take 1 tablet (20 mg total) by mouth 2 (two) times daily     meclizine (ANTIVERT) 25 MG tablet     Take 1 tablet (25 mg total) by mouth 3 (three) times daily as needed for Dizziness     Multiple Vitamins-Minerals (MULTIVITAMIN WITH MINERALS) tablet     Take 1 tablet by mouth every morning         rosuvastatin (CRESTOR) 40 MG tablet     Take 0.5 tablets (20 mg total) by mouth daily     simvastatin (ZOCOR) 40 MG  tablet     TAKE 1 TABLET BY MOUTH ONCE DAILY IN THE MORNING     vitamin B-1 (THIAMINE) 100 MG tablet     Take 100 mg by mouth every morning.           Review of Systems   Constitutional: Negative for chills and fever.   Respiratory: Positive for shortness of breath.    Cardiovascular: Positive for chest pain. Negative for leg swelling.   Gastrointestinal: Positive for nausea.   Genitourinary: Negative for dysuria.   Musculoskeletal: Negative for arthralgias, back pain and neck pain.   Neurological: Positive for weakness and numbness. Negative for headaches.   All other systems reviewed and are negative.      Physical Exam    BP: 153/69, Heart Rate: 63, Temp: 98.6 F (37 C), Resp Rate: 16, SpO2: 99 %, Weight: 75.7 kg    Physical Exam  Vitals and nursing note reviewed.   Constitutional:       Appearance: She is well-developed.   HENT:      Head: Normocephalic and atraumatic.   Cardiovascular:      Rate and Rhythm: Normal rate and regular rhythm.      Heart sounds: No murmur.   Pulmonary:      Effort: Pulmonary effort is normal. No respiratory distress.      Breath sounds: Normal breath sounds. No wheezing or rales.   Abdominal:      General: Bowel sounds are normal. There is no distension.      Palpations: Abdomen is soft.      Tenderness: There is no abdominal tenderness.   Skin:     General: Skin is warm and dry.   Neurological:      Mental Status: She is alert and oriented to person, place, and time.      Cranial  Nerves: No cranial nerve deficit.      Motor: Pronator drift ( Left-sided) present.           MDM and ED Course     ED Medication Orders (From admission, onward)    Start Ordered     Status Ordering Provider    01/27/20 1633 01/27/20 1632  aspirin tablet 325 mg  Daily     Route: Oral  Ordered Dose: 325 mg     Ordered Shellsea Borunda O    01/27/20 1522 01/27/20 1522  iodixanol (VISIPAQUE) 320 MG/ML injection 150 mL  IMG once as needed     Route: Intravenous  Ordered Dose: 150 mL     Last MAR action: Imaging Agent Given Jesse Nosbisch O         EKG [my interpretation]: NSR at 61 bpm, no ST changes, nonspecific ST images, normal EKG.    MDM  Number of Diagnoses or Management Options  Diagnosis management comments: Concern for CVA.  Not a candidate for alteplase given duration of symptoms.  Will get CT angiogram and perfusion.  Will consult with neurology.    Results     Procedure Component Value Units Date/Time    COVID-19 (SARS-COV-2) [facility] Rapid) [[id number]] Collected: 01/27/20 1625    Specimen: Nasopharyngeal Swab from Nasopharynx Updated: 01/27/20 1625    Narrative:      o Collect and clearly label specimen type:  o Upper respiratory specimen: One Nasopharyngeal Dry Swab NO  Transport Media.  o Hand deliver to laboratory ASAP  Indication for testing->Extended care facility admission to  semi private room    Urinalysis Reflex to Microscopic Exam-  Reflex to Culture [[id number]] Collected: 01/27/20 1550    Specimen: Urine Updated: 01/27/20 1622     Urine Type Urine, Clean Ca     Color, UA Yellow     Clarity, UA Clear     Specific Gravity UA 1.020     Urine pH 7.0     Leukocyte Esterase, UA Negative     Nitrite, UA Negative     Protein, UR Negative     Glucose, UA Negative     Ketones UA Negative     Urobilinogen, UA Negative mg/dL      Bilirubin, UA Negative     Blood, UA Negative     Squamous Epithelial Cells, Urine 0 - 5 /hpf     Narrative:      Replace urinary catheter prior to obtaining the urine culture   if it has been in place for greater than or equal to 14  days:->N/A No Foley  Indications for U/A Reflex to Micro - Reflex to  Culture:->Acute neurologic change without other likely cause    Troponin I [[id number]] Collected: 01/27/20 1531    Specimen: Blood Updated: 01/27/20 1602     Troponin I <0.01 ng/mL     Comprehensive metabolic panel [[id number]]  (Abnormal) Collected: 01/27/20 1532    Specimen: Blood Updated: 01/27/20 1556     Glucose 101 mg/dL      BUN 11 mg/dL      Creatinine 0.8 mg/dL      Sodium 401 mEq/L      Potassium 4.0 mEq/L      Chloride 105 mEq/L      CO2 26 mEq/L      Calcium 8.1 mg/dL      Protein, Total 5.3 g/dL      Albumin 3.3 g/dL      AST (SGOT) 19 U/L      ALT 10 U/L      Alkaline Phosphatase 62 U/L      Bilirubin, Total 0.3 mg/dL      Globulin 2.0 g/dL      Albumin/Globulin Ratio 1.7     Anion Gap 7.0    GFR [[id number]] Collected: 01/27/20 1532     Updated: 01/27/20 1556     EGFR >60.0    CBC and differential [[id number]]  (Abnormal) Collected: 01/27/20 1531    Specimen: Blood Updated: 01/27/20 1538     WBC 6.45 x10 3/uL      Hgb 10.2 g/dL      Hematocrit 56.3 %      Platelets 251 x10 3/uL      RBC 3.72 x10 6/uL      MCV 88.4 fL      MCH 27.4 pg      MCHC 31.0 g/dL      RDW 13 %      MPV 11.4 fL      Neutrophils 58.7 %      Lymphocytes Automated 29.8 %      Monocytes 9.0 %      Eosinophils Automated 1.7 %      Basophils Automated 0.6 %      Immature Granulocytes 0.2 %      Nucleated RBC 0.0 /100 WBC      Neutrophils Absolute 3.79 x10 3/uL      Lymphocytes Absolute Automated 1.92 x10 3/uL      Monocytes Absolute Automated 0.58 x10 3/uL      Eosinophils Absolute Automated 0.11 x10 3/uL      Basophils Absolute Automated  0.04 x10 3/uL      Immature Granulocytes Absolute 0.01 x10 3/uL      Absolute NRBC 0.00 x10 3/uL     Prothrombin time/INR [[id number]] Collected: 01/27/20 1531    Specimen: Blood Updated: 01/27/20 1533    APTT [[id number]] Collected: 01/27/20 1531     Updated: 01/27/20 1533         Radiology Results (24 Hour)     Procedure Component Value Units Date/Time    CTA  Head & Neck [[id number]] Collected: 01/27/20 1547    Order Status: Completed Updated: 01/27/20 1552    Narrative:      HISTORY: TIA OR CVA    TECHNIQUE: Enhanced computed tomography angiography of the head and neck  was performed at 0.65 mm. Maximum intensity projection and surface  shaded 3d reconstruction were performed on the [place] station.  Assessment of clinically significant stenosis based on NASCET method  which calculates the degree of stenosis with reference to the lumen of  the carotid artery distal to a stenosis.      Contrast dose: 100 cc Visipaque 320    PRIORS: There are no prior studies for comparison.    FINDINGS:   Head: There is no aneurysmal dilatation or stenosis involving the  cavernous and supraclinoid portions of the internal carotid arteries.  The internal and middle cerebral arteries are unremarkable.  Evaluation of the posterior circulation show the intradural portion of  the vertebral arteries to be normal as is the basilar and posterior  cerebral arteries.    Neck: The aortic arch in unremarkable. CT angiography of the neck  demonstrates no evidence of hemodynamically significant stenosis of the  common and internal carotid arteries in the neck.     Evaluation of the posterior circulation shows the vertebral arteries to  be normal in caliber and course from their origins to the  vertebrobasilar junction.  There are degenerative changes of the spine.    Impression:       No discrete high-grade extracranial or intracranial stenosis  or occlusion.      Note: Note that CT scanning at this site utilizes multiple dose  reduction techniques including automatic exposure control, adjustment of  the MAA and/or KVP according to patient's size and use of iterative  reconstruction technique    Laurena Slimmer, MD   01/27/2020 3:50 PM    CT Perfusion Brain [[id number]] Collected: 01/27/20 1546    Order Status: Completed  Updated: 01/27/20 1549    Narrative:      HISTORY: left sided weakness      TECHNIQUE:  Two (2) scout scans were obtained through the level of the  basal ganglia.  During administration of a bolus of 40 cc nonionic  contrast, dynamic imaging was obtained at a rate of one image per second  for 40 seconds.  The data set was reconstructed and maps were generated  with determination of cerebral flow, cerebral blood volume and  peak-to-peak transit map    INTERPRETATION: The cortical flow and blood volume perfusion is normal  and symmetrical. The time-to-peak map demonstrates a homogeneous map  with no areas of increased transit.      Impression:       Normal study with no evidence of cerebral ischemia or  infarction of the major cerebral territories.    Laurena Slimmer, MD   01/27/2020 3:47 PM    CT Head WO Contrast [[id number]] Collected: 01/27/20 1507    Order Status: Completed Updated: 01/27/20 1517  Narrative:      CT HEAD WO CONTRAST    CLINICAL INDICATION:   Neuro deficit, acute, stroke suspected    COMPARISON: 08/08/2019    TECHNIQUE: 5 mm axial images from the skull base to the vertex. The  following ?dose reduction techniques were utilized: automated exposure  control and/or adjustment of the mA and/or kV according to patient  size, and the use of iterative reconstruction technique.    FINDINGS:     The ventricles, cisterns, and sulci appear within normal size limits.  There is no mass effect or midline shift. There is no hemorrhage or  abnormal extra-axial fluid collection. The gray-white differentiation  appears maintained. Bone windows demonstrate no evidence for acute  osseous abnormality. The included paranasal sinuses and mastoid air  cells appear clear. There are atherosclerotic vascular calcifications.      Impression:       No acute intracranial process.    Sandie Ano, MD   01/27/2020 3:15 PM            ED Course as of Jan 26 1633   Mon Jan 27, 2020   1633 Discussed with Dr. Francesco Sor, will consult      [BO]    1633 Discussed with Dr. Wilhelmina Mcardle, Will admit    [BO]      ED Course User Index  [BO] Genia Hotter, MD         NIH Stroke Score      Most Recent Value   Patient's calculated Stroke Score:  8 filed at 01/27/2020 1508          Procedures    Clinical Impression & Disposition     Clinical Impression  Final diagnoses:   Left-sided weakness        ED Disposition     ED Disposition Condition Date/Time Comment    Admit  Mon Jan 27, 2020  4:32 PM Admitting Physician: Hoy Finlay P [[id number]]   Service:: Medicine [[id number]]   Estimated Length of Stay: > or = to 2 midnights   Tentative Discharge Plan?: Home or Self Care [1]   Does patient need telemetry?: Yes   Telemetry type (separate Telemetry order is also required):: Medical telemetry             New Prescriptions    No medications on file                 Genia Hotter, MD  01/27/20 [phone]

## 2020-01-27 NOTE — EDIE (Signed)
COLLECTIVE?NOTIFICATION?01/27/2020 14:46?Trenise, Turay B?MRN: [id number]    [facility]'s patient encounter information:   YNW:?[id number]  Account [id number]  Billing Account [id number]      Criteria Met      5 ED Visits in 12 Months    Security and Safety  No recent Security Events currently on file    ED Care Guidelines  There are currently no ED Care Guidelines for this patient. Please check your facility's medical records system.        Prescription Monitoring Program  000??- Narcotic Use Score  000??- Sedative Use Score  000??- Stimulant Use Score  000??- Overdose Risk Score  - All Scores range from 000-999 with 75% of the population scoring < 200 and on 1% scoring above 650  - The last digit of the narcotic, sedative, and stimulant score indicates the number of active prescriptions of that type  - Higher Use scores correlate with increased prescribers, pharmacies, mg equiv, and overlapping prescriptions  - Higher Overdose Risk Scores correlate with increased risk of unintentional overdose death   Concerning or unexpectedly high scores should prompt a review of the PMP record; this does not constitute checking PMP for prescribing purposes.      E.D. Visit Count (12 mo.)  Facility Visits   [place] [facility] 5   Total 5   Note: Visits indicate total known visits.     Recent Emergency Department Visit Summary  Date Facility [facility] Type Diagnoses or Chief Complaint   Jan 27, 2020 [place] [facility]      possible stroke      Dec 28, 2019 [place] [facility]      both breast pain      Chest Pain      Mastodynia      Secondary hypertension, unspecified      Aug 08, 2019 [place] [facility] [place] [facility] [place] [facility]      fall [id number]/facial injuries      Fall      Contusion of other part of head, initial encounter      Cervicalgia      Pleurodynia      Fall (on) (from) other stairs and steps, initial encounter      Pain in  right shoulder      Apr 15, 2019 [place] [facility] [place] [facility] [place] [facility]      Chest Pain      Dizziness      Numbness      Anesthesia of skin      Feb 22, 2019 [place] [facility] [place] [facility]      post op complications, rt breast      Breast Pain      Scar conditions and fibrosis of skin      Pain, unspecified          Recent Inpatient Visit Summary  Date Facility [facility] Type Diagnoses or Chief Complaint   Apr 15, 2019 [place] [facility] [place] [facility]      Anesthesia of skin          Care Team  Provider Specialty Phone Fax Service Dates   DAVIDSON, Janet Berlin MD M, MD Family Medicine: Adult Medicine [phone] Current      Collective Portal  This patient has registered at the [facility] Emergency Department  For more information visit: [email or web address]     PLEASE NOTE:     1.   Any care recommendations and other clinical information are provided as guidelines or for historical purposes only, and providers should exercise their own clinical judgment when providing care.    2.   You may only use this information for purposes of treatment, payment or health care operations activities, and subject to the limitations of applicable Collective Policies.    3.   You should consult directly with the organization that provided a care guideline or other clinical history with any questions about additional information or accuracy or completeness of information provided.    ? 2021 [place], [employer] - [email or web address]

## 2020-01-28 ENCOUNTER — Observation Stay (HOSPITAL_BASED_OUTPATIENT_CLINIC_OR_DEPARTMENT_OTHER): Payer: No Typology Code available for payment source

## 2020-01-28 ENCOUNTER — Observation Stay: Payer: No Typology Code available for payment source

## 2020-01-28 DIAGNOSIS — G459 Transient cerebral ischemic attack, unspecified: Secondary | ICD-10-CM

## 2020-01-28 DIAGNOSIS — R0789 Other chest pain: Principal | ICD-10-CM

## 2020-01-28 LAB — ECHOCARDIOGRAM ADULT COMPLETE W CLR/ DOPP WAVEFORM
AV Peak Velocity: 100
IVS Diastolic Thickness (2D): 1.11
IVS Diastolic Thickness (2D): 1.11
LVID diastole (2D): 3.65
LVID diastole (2D): 4.64
LVID systole (2D): 2.22
LVID systole (2D): 2.3
MV Area (PHT): 2.759
MV E/A: 0.797
MV E/A: 0.8
MV E/e' (Average): 14.968
Pulmonary Valve Findings: NORMAL
RV Basal Diastolic Dimension: 2.44
RV Basal Diastolic Dimension: 2.66
RV Systolic Pressure: 28
RV Systolic Pressure: 29.832
TAPSE: 2.13

## 2020-01-28 LAB — HEMOGLOBIN A1C
Average Estimated Glucose: 116.9 mg/dL
Hemoglobin A1C: 5.7 % (ref 4.6–5.9)

## 2020-01-28 LAB — LIPID PANEL
Cholesterol / HDL Ratio: 3.9
Cholesterol: 182 mg/dL (ref 0–199)
HDL: 47 mg/dL (ref 40–9999)
LDL Calculated: 119 mg/dL — ABNORMAL HIGH (ref 0–99)
Triglycerides: 82 mg/dL (ref 34–149)
VLDL Calculated: 16 mg/dL (ref 10–40)

## 2020-01-28 LAB — APTT: PTT: 37 s (ref 27–39)

## 2020-01-28 LAB — PT/INR
PT INR: 1 (ref 0.9–1.1)
PT: 12 s (ref 10.1–12.9)

## 2020-01-28 LAB — TROPONIN I
Troponin I: 0.01 ng/mL (ref 0.00–0.05)
Troponin I: <0.01 ng/mL (ref 0.00–0.05)

## 2020-01-28 LAB — HEMOLYSIS INDEX: Hemolysis Index: 89 — ABNORMAL HIGH (ref 0–18)

## 2020-01-28 MED ORDER — ASPIRIN 81 MG PO CHEW
81.00 mg | CHEWABLE_TABLET | Freq: Every day | ORAL | Status: DC
Start: 2020-01-28 — End: 2020-01-29
  Administered 2020-01-28 – 2020-01-29 (×2): 81 mg via ORAL
  Filled 2020-01-28 (×2): qty 1

## 2020-01-28 MED ORDER — GADOBUTROL 1 MMOL/ML IV SOLN
7.50 mL | Freq: Once | INTRAVENOUS | Status: AC | PRN
Start: 2020-01-28 — End: 2020-01-28
  Administered 2020-01-28: 7 mmol via INTRAVENOUS

## 2020-01-28 NOTE — OT Eval Note (Addendum)
[facility]  [address]  [place], [place] [address]  [phone]    Occupational Therapy Evaluation    Patient: Emily [name] MRN: [id number]   Unit: [facility] INTERMEDIATE CARE Bed: AO130/QM578-46    Time of treatment: Time Calculation  OT Received On: 01/28/20  Start Time: 1012  Stop Time: 1022  Time Calculation (min): 10 min    Consult received for Emily [name] for OT evaluation and treatment.  Patient's medical condition is appropriate for Occupational Therapy  intervention at this time.    Interpreter utilized: no, not indicated    D/C Suggestions   Home no OT needs    Transport Recommendations: no limitations    Assessment       Emily [name] is a 74 y.o. female admitted 01/27/2020 with new onset numbness of L side of face, arm and Leg, as well as SOB and chest pain. Pt reports chest pain and SOB have resolved, but continues to have residual N/T in LUE as well as R shld/back pain. At baseline, pt lives alone in an apt and Is indep with all ADL, mobility. On eval, pt reports pain but is agreeable to therapy. Pt performs bed mobility, transfers, functional mobility with mod I. Pt able to perform sinkside hand hygiene with mod I as well as don socks while seated at EOB. Pt appears to be functioning close to baseline at this time.   Brief chart review completed including review of labs review of imaging review of vitals.  Pt's ability to complete ADLs and functional transfers is at/near fxn'l baseline. Pt does not have any acute OT needs at this time. D/C OT.         Complexity Chart Review Performance Deficits Clinical Decision Making Hx/Comorbidities Assistance needed   Low  Brief  1-3 Limited options None None (or at baseline)       PMP - Progressive Mobility Protocol   PMP Activity: Step 6 - Walks in Room  Distance Walked (ft) (Step 6,7): 40 Feet         Interdisciplinary Communication: discussed with PT/RN    Plan     OT Plan  Risks/Benefits/POC Discussed with Pt/Family: With patient  Treatment  Interventions: No skilled interventions needed at this time  Discharge Recommendation: Home with no needs  DME Recommended for Discharge: No additional equipment/DME recommended at this time  OT Frequency Recommended: one time visit - therapy discontinued         Medical Diagnosis: Left-sided weakness [R53.1]    History of Present Illness: Emily [name] is a 74 y.o. female admitted on  01/27/2020 with "new onset of numbness involving the left side of her face arm and the leg that started yesterday afternoon and today her symptoms progressively worsened associated with chest pain and shortness of breath and she decided to come to the hospital.  When I asked her if this is happened to her before she says she has never had the symptoms previously even though she has been admitted to this hospital multiple times for similar presentation"      Patient Active Problem List   Diagnosis   . Malignant neoplasm of overlapping sites of right female breast   . Hypertension   . Mixed hyperlipidemia   . Hypothyroid   . Right sided numbness   . Chest discomfort   . SOB (shortness of breath)   . Left sided numbness   . Left-sided weakness     Past Medical History:  Diagnosis Date   . Breast cancer 2014    right breast mastectomy   . GERD (gastroesophageal reflux disease)    . Hyperlipidemia    . Hypertension    . Hypothyroidism    . Malignant neoplasm of overlapping sites of right female breast 10/19/2015   . Vertigo      Past Surgical History:   Procedure Laterality Date   . HYSTERECTOMY     . MASTECTOMY Right 2015         X-Rays/Tests/Labs:  Lab Results   Component Value Date/Time    HGB 10.2 (L) 01/27/2020 03:31 PM    HCT 32.9 (L) 01/27/2020 03:31 PM    K 4.0 01/27/2020 03:32 PM    NA 138 01/27/2020 03:32 PM    INR 1.0 01/27/2020 11:51 PM    TROPI 0.01 01/28/2020 01:17 AM    TROPI <0.01 01/27/2020 11:51 PM    TROPI <0.01 01/27/2020 03:31 PM    TROPI <0.01 12/28/2019 01:57 PM    TROPI 0.01 07/15/2009 09:40 AM     CTA Head & Neck  (Order: [id number]) - 01/27/2020  IMPRESSION:    No discrete high-grade extracranial or intracranial stenosis  or occlusion.    CT Head WO Contrast (Order: [id number]) - 01/27/2020  IMPRESSION:    No acute intracranial process.    CTA Head & Neck (Order: [id number]) - 01/27/2020  IMPRESSION:    No discrete high-grade extracranial or intracranial stenosis  or occlusion.    Social History:  Lives alonein an apartment.  Entry Steps: 1 flight, no elevator accessRails: yesInside steps: 0Rails: 0  Equipment at home: tub/shower combination  Prior Level of Function: indep all ADL/IADL, no DME/AE at home, cooks, drives  Cognition: WFL  Mobility: independent  Feeding: independent  Grooming: independent  Bathing: independent  Dressing: independent  Toileting: independent    Subjective   "my back hurts now"  Patient is agreeable to participation in the therapy session. Nursing clears patient for therapy.  Patient's Goal:  To go home  Pain: Pt unable to quantify  Location: R shld and back  Therapist Intervention: positioned for comfort  Patient is satisfied with therapist intervention.    Objective     Precautions:   Precautions  Weight Bearing Status: no restrictions    Patient is in bed with  Telemetry and Intravenous Access  in place.       Observation of patient/vitals:   Vitals:    01/28/20 0410 01/28/20 0745 01/28/20 0750 01/28/20 1204   BP: 137/74  156/74 151/75   Pulse: (!) 53 (!) 58 (!) 57 (!) 53   Resp: 16 18  18    Temp: 97.5 F (36.4 C) 98.2 F (36.8 C)  98.1 F (36.7 C)   TempSrc: Oral Oral  Oral   SpO2: 98% 98% 97% 99%   Weight:       Height:           Orientation/Cognition:     Alert and Oriented x 4  Cognition: follows all commands      Musculoskeletal Examination:     ROM Strength   RUE WFL WFL   LUE WFL WFL     Sensation: Intact to light touch, reports N/T in LUE  Coordination: Intact gross motor and serial  opposition to B hands    Vision: WFL  Hearing: WFL      Functional Mobility:  Rolling: indep    Supine to sit: indep   Scooting: indep   Sit to  Supine: indep    Sit to stand: indep   Stand to sit: indep   Transfers: indep  Off/on bed  Ambulation: mod I 40' no AD, slow cadence    Balance:  Static Sit Balance: good   Dynamic Sit Balance: good   Static Stand Balance: good   Dynamic Stand Balance: fair +    Self Care:  Grooming: mod I, sinkside hand hygiene  LB Dressing: mod I socks, seated at EOB  Toileting: declines need        Endurance: good    Participation:  good    Education:  Educated the patient to role of occupational therapy, plan of care, goals  of therapy and safety with mobility and ADLs.    RN notified of session outcome and that patient was left in bed with all needs met and equipment intact.   Safety measures include: handoff to nurse/clin tech/ unit secretary completed, bed alarm activated, oriented to call bell and placed within reach, personal items within reach, assistive device positioned out of reach and bed placed in lowest position.   Mobility and ADL status posted at bedside and within E.M.R.                Goals:  Goals  Goal Formulation: Patient  Time For Goal Achievement: (no goals)    Therapist PPE during session procedural mask, goggles  and gloves      Signature:   Ocean Kearley, OTR/L  01/28/2020  3:41 PM  Phone [phone]    (For scheduling questions, please contact rehab tech [phone])    Attention MD:   Thank you for allowing [place] to participate in the care of Emily [name]. Regulations from the Center for Medicare and Medicaid Services (CMS) require your review and approval of this plan of care.     Please cosign this note indicating you are in agreement with theTherapy Plan of Care so we may initiate the therapy treatment plan.

## 2020-01-28 NOTE — Progress Notes (Signed)
PROGRESS NOTE    Date Time: 01/28/20 8:26 AM  Patient Name: Emily [name], Emily [name]      Subjective:   Pt awake       Review of Systems:        No further numbess tingliing or weakness  Physical Exam:     Vitals:    01/28/20 0750   BP: 156/74   Pulse: (!) 57   Resp:    Temp:    SpO2: 97%       General appearance - alert, well appearing, and in no distress  Mental status - alert, oriented to person, place, and time  Eyes - pupils equal and reactive, extraocular eye movements intact  Nose - normal and patent, no erythema, discharge or polyps  Mouth - mucous membranes moist, pharynx normal without lesions  Neck - supple, no significant adenopathy  Lymphatics - no palpable lymphadenopathy, no hepatosplenomegaly  Chest - clear to auscultation, no wheezes, rales or rhonchi, symmetric air entry  Heart - normal rate, regular rhythm, normal S1, S2, no murmurs, rubs, clicks or gallops  Abdomen - soft, nontender, nondistended, no masses or organomegaly  Neurological - alert, oriented, normal speech, no focal findings or movement disorder noted  Musculoskeletal - no joint tenderness, deformity or swelling  Extremities - peripheral pulses normal, no pedal edema, no clubbing or cyanosis  Skin - normal coloration and turgor, no rashes, no suspicious skin lesions noted    Medications:     Current Facility-Administered Medications   Medication Dose Route Frequency   . aspirin  325 mg Oral Daily   . heparin (porcine)  5,000 Units Subcutaneous Q12H [facility]   . levothyroxine  25 mcg Oral Daily at 0600   . lisinopril  20 mg Oral BID   . pantoprazole  40 mg Oral QAM AC   . rosuvastatin  20 mg Oral Daily   . thiamine  100 mg Oral QAM       Intake and Output Summary (Last 24 hours) at Date Time    Intake/Output Summary (Last 24 hours) at 01/28/2020 0826  Last data filed at 01/27/2020 2235  Gross per 24 hour   Intake --   Output 350 ml   Net -350 ml           Labs:     Recent Labs   Lab 01/27/20  1532   Sodium 138   Potassium 4.0   Chloride 105   CO2 26    BUN 11   Creatinine 0.8   Calcium 8.1   Albumin 3.3*   Protein, Total 5.3*   Bilirubin, Total 0.3   Alkaline Phosphatase 62   ALT 10   AST (SGOT) 19   Glucose 101*     Recent Labs   Lab 01/27/20  1531   WBC 6.45   Hgb 10.2*   Hematocrit 32.9*   Platelets 251               Rads:     Radiology Results (24 Hour)     Procedure Component Value Units Date/Time    CTA  Head & Neck [[id number]] Collected: 01/27/20 1547    Order Status: Completed Updated: 01/27/20 1552    Narrative:      HISTORY: TIA OR CVA    TECHNIQUE: Enhanced computed tomography angiography of the head and neck  was performed at 0.65 mm. Maximum intensity projection and surface  shaded 3d reconstruction were performed on the [place] station.  Assessment of clinically significant  stenosis based on NASCET method  which calculates the degree of stenosis with reference to the lumen of  the carotid artery distal to a stenosis.      Contrast dose: 100 cc Visipaque 320    PRIORS: There are no prior studies for comparison.    FINDINGS:   Head: There is no aneurysmal dilatation or stenosis involving the  cavernous and supraclinoid portions of the internal carotid arteries.  The internal and middle cerebral arteries are unremarkable.  Evaluation of the posterior circulation show the intradural portion of  the vertebral arteries to be normal as is the basilar and posterior  cerebral arteries.    Neck: The aortic arch in unremarkable. CT angiography of the neck  demonstrates no evidence of hemodynamically significant stenosis of the  common and internal carotid arteries in the neck.     Evaluation of the posterior circulation shows the vertebral arteries to  be normal in caliber and course from their origins to the  vertebrobasilar junction.  There are degenerative changes of the spine.    Impression:       No discrete high-grade extracranial or intracranial stenosis  or occlusion.      Note: Note that CT scanning at this site utilizes multiple dose  reduction  techniques including automatic exposure control, adjustment of  the MAA and/or KVP according to patient's size and use of iterative  reconstruction technique    Laurena Slimmer, MD   01/27/2020 3:50 PM    CT Perfusion Brain [[id number]] Collected: 01/27/20 1546    Order Status: Completed Updated: 01/27/20 1549    Narrative:      HISTORY: left sided weakness      TECHNIQUE:  Two (2) scout scans were obtained through the level of the  basal ganglia.  During administration of a bolus of 40 cc nonionic  contrast, dynamic imaging was obtained at a rate of one image per second  for 40 seconds.  The data set was reconstructed and maps were generated  with determination of cerebral flow, cerebral blood volume and  peak-to-peak transit map    INTERPRETATION: The cortical flow and blood volume perfusion is normal  and symmetrical. The time-to-peak map demonstrates a homogeneous map  with no areas of increased transit.      Impression:       Normal study with no evidence of cerebral ischemia or  infarction of the major cerebral territories.    Laurena Slimmer, MD   01/27/2020 3:47 PM    CT Head WO Contrast [[id number]] Collected: 01/27/20 1507    Order Status: Completed Updated: 01/27/20 1517    Narrative:      CT HEAD WO CONTRAST    CLINICAL INDICATION:   Neuro deficit, acute, stroke suspected    COMPARISON: 08/08/2019    TECHNIQUE: 5 mm axial images from the skull base to the vertex. The  following ?dose reduction techniques were utilized: automated exposure  control and/or adjustment of the mA and/or kV according to patient  size, and the use of iterative reconstruction technique.    FINDINGS:     The ventricles, cisterns, and sulci appear within normal size limits.  There is no mass effect or midline shift. There is no hemorrhage or  abnormal extra-axial fluid collection. The gray-white differentiation  appears maintained. Bone windows demonstrate no evidence for acute  osseous abnormality. The included paranasal sinuses and mastoid  air  cells appear clear. There are atherosclerotic vascular calcifications.      Impression:  No acute intracranial process.    Sandie Ano, MD   01/27/2020 3:15 PM            Assessment:     Patient is a 74 year old female with hypertension hyperlipidemia hypothyroidism GERD breast cancer with left-sided tingling and atypical chest pain      Plan:     imcu monitor  Cont aspirin  Cont pantoprazole 40 mg per day  MRI brain  3 d echo  cardiol eval for atypical cp  Neurology follow up   Monitor clinically        Signed by: Mechele Dawley, MD  01/28/2020  8:26 AM

## 2020-01-28 NOTE — SLP Eval Note (Signed)
[facility]  [address]  [place], [place] [address]  [phone]    SPEECH LANGUAGE COGNITIVE SWALLOW EVALUATION    Patient: Emily [name]    MRN#: [id number]    Date/Time of Evaluation:   Time Calculation  SLP Received On: 01/28/20  Start Time: 0731  Stop Time: 0751  Time Calculation (min): 20 min  Total Treatment Time (min): 20    Consult received for Emily [name] for SLP Evaluation and Treatment.    Referring Physician: Dr. Wilhelmina Mcardle     Date of Referral: 01/28/2020    Interpreter utilized: no, not indicated    Therapist PPE during session procedural mask, goggles  and gloves     Assessment and Clinical Impression   Emily [name] is a 74 y.o. female admitted 01/27/2020 for  Left-sided weakness [R53.1] presenting with speech, language, cognitive linguistic skills, and swallow WFL and at baseline. Consult received per stroke protocol as patient admitted with L facial and hemibody paresthesias. MRI pending; CT does not reveal acute process. She reports no changes in cognitive communicative or swallow function. Intact orientation, recall of acute medical event, and safety awareness (re: call bell to contact RN). Oral motor exam unremarkable; intact symmetry and ROM with normal vocal quality. Clinical swallow evaluation deferred as patient NPO for procedure, but passed RN dysphagia screen and reports no concerns with swallow function in home environment. No further SLP intervention indicated.     Plan   Plan / Recommendations  Plan: begin/continue oral diet, no SLP f/u necessary  SLP Frequency Recommended: one time visit  Diet Solids Recommendation: regular  Diet Liquids Recommendations: thin consistency  Precautions/Compensations: Awake/alert, Upright 90 degrees for all oral intake  Recommended Form of Meds: PO, whole, with liquid  Recommendation Discussed With: : Patient, Nurse      Goals: n/a   Rehabilitation Prognosis: n/a n/a      History     Prior Speech Therapy Intervention: n/a     Medical  Diagnosis: Left-sided weakness [R53.1]    History of Present Illness: Emily [name] is a 74 y.o. female admitted on 01/27/2020 with   Patient Active Problem List   Diagnosis   . Malignant neoplasm of overlapping sites of right female breast   . Hypertension   . Mixed hyperlipidemia   . Hypothyroid   . Right sided numbness   . Chest discomfort   . SOB (shortness of breath)   . Left sided numbness   . Left-sided weakness        Past Medical/Surgical History  Past Medical History:   Diagnosis Date   . Breast cancer 2014    right breast mastectomy   . GERD (gastroesophageal reflux disease)    . Hyperlipidemia    . Hypertension    . Hypothyroidism    . Malignant neoplasm of overlapping sites of right female breast 10/19/2015   . Vertigo       Past Surgical History:   Procedure Laterality Date   . HYSTERECTOMY     . MASTECTOMY Right 2015             Subjective   Patient is agreeable to participation in the therapy session. Patient's medical condition is  appropriate for speech therapy intervention at this time.  Patient's goal:  Not asked specifically, but patient expresses desire for numbness and pain to resolve.   Pain: pt denies pain      Social History: Patient lives alone and is independent with functional  tasks.      Objective     Respiratory Status  Current Status  Respiratory Status: within normal limits      Oral Motor Skills  Labial ROM: Within Functional Limits  Labial Symmetry: Within Functional Limits     Lingual ROM: Within Functional Limits  Lingual Symmetry: Within Functional Limits  Lingual Sensation: Within Functional Limits  Velum: Within Functional Limits  Mandible: Within Functional Limits  Facial ROM: Within Functional Limits  Facial Symmetry: Within Functional Limits  Facial Sensation: Within Functional Limits  Vocal Quality: Within Functional Limits     Vocal Intensity: Within Functional Limits           Breath Support: Adequate for speech  Dentition: Adequate  Hearing: Within Functional Limits        Deglutition Skills  Position: upright 90 degrees    Language Comprehension  Auditory Comprehension: Within Functional Limits           Following Commands: Follows one step commands without difficulty    Language Expression  Single Words: WFL  Connected Speech: WFL             Cognitive Linguistic Skills  Arousal/Alertness: Appropriate responses to stimuli  Attention Span: Appears intact  Orientation Level: Oriented X4  Memory: Appears intact  Safety Awareness: independent  Insights: Fully aware of deficits             Formal Testing  n/a        Signature  Gaylan Gerold, M.Ed CCC-SLP  01/28/2020  Phone: [phone]  [phone] for Rehab Tech/scheduling questions)

## 2020-01-28 NOTE — PT Eval Note (Signed)
[facility]  [address]  [place], [place] [address]  [phone]    Physical Therapy Evaluation    Patient: Emily [name] MRN: [id number]   Unit: [facility] INTERMEDIATE CARE Bed: [id number]/QM578-46    Time of Treatment: Time Calculation  PT Received On: 01/28/20  Start Time: 9629  Stop Time: 0907  Time Calculation (min): 15 min    Consult received for Emily [name] for PT evaluation and treatment.  Patient's medical condition is appropriate for Physical Therapy  intervention at this time.    Interpreter utilized: no, not indicated      D/C Suggestions   Based on today's performance:  Recommendation  Discharge Recommendation: Home with no needs  DME Recommended for Discharge: No additional equipment/DME recommended at this time  PT Frequency: one time visit - therapy discontinued.    Transport Recommendations: no limitations    Discharge recommendations are based on patient's progression/regression. Please see most recent note for updated discharge recommendations.    Assessment     Emily [name] is a 74 y.o. female admitted 01/27/2020  with new onset numbness of L side of face, arm and Leg, as well as SOB and chest pain. Pt reports chest pain and SOB have resolved, but continues to have residual N/T in LUE as well as R shld/back pain. At baseline, pt lives alone in an apt and Is indep with all ADL, mobility.  During PT evaluation, pt was mod indep with all mobility with significantly slow cadence that appeared nonphysiological. Pt has no further acute PT needs at this time. If back pain persists, pt may benefit from OPPT for back pain.           Complexity Level Hx and Co  morbidites Examination Clinical Decision Making Clinical Presentation   Low  no impact 1-2 elements Limited options Stable       PMP - Progressive Mobility Protocol   PMP Activity: Step 7 - Walks out of Room  Distance Walked (ft) (Step 6,7): 200 Feet            Interdisciplinary Communication: Discussed with RN/OT      Plan      Plan  Risks/Benefits/POC Discussed with Pt/Family: With patient  Treatment/Interventions: No skilled interventions needed at this time  PT Frequency: one time visit - therapy discontinued         Medical Diagnosis: Left-sided weakness [R53.1]    History of Present Illness: Emily [name] is a 74 y.o. female admitted on  01/27/2020 with "new onset of numbness involving the left side of her face arm and the leg that started yesterday afternoon and today her symptoms progressively worsened associated with chest pain and shortness of breath and she decided to come to the hospital.  When I asked her if this is happened to her before she says she has never had the symptoms previously even though she has been admitted to this hospital multiple times for similar presentation"      Patient Active Problem List   Diagnosis   . Malignant neoplasm of overlapping sites of right female breast   . Hypertension   . Mixed hyperlipidemia   . Hypothyroid   . Right sided numbness   . Chest discomfort   . SOB (shortness of breath)   . Left sided numbness   . Left-sided weakness     Past Medical History:   Diagnosis Date   . Breast cancer 2014    right breast mastectomy   .  GERD (gastroesophageal reflux disease)    . Hyperlipidemia    . Hypertension    . Hypothyroidism    . Malignant neoplasm of overlapping sites of right female breast 10/19/2015   . Vertigo      Past Surgical History:   Procedure Laterality Date   . HYSTERECTOMY     . MASTECTOMY Right 2015       X-Rays/Tests/Labs:  Lab Results   Component Value Date/Time    HGB 10.2 (L) 01/27/2020 03:31 PM    HCT 32.9 (L) 01/27/2020 03:31 PM    K 4.0 01/27/2020 03:32 PM    NA 138 01/27/2020 03:32 PM    INR 1.0 01/27/2020 11:51 PM    TROPI 0.01 01/28/2020 01:17 AM    TROPI <0.01 01/27/2020 11:51 PM    TROPI <0.01 01/27/2020 03:31 PM    TROPI <0.01 12/28/2019 01:57 PM    TROPI 0.01 07/15/2009 09:40 AM     CTA Head & Neck (Order: [id number]) - 01/27/2020  IMPRESSION:   No discrete  high-grade extracranial or intracranial stenosis  or occlusion.    CT Head WO Contrast (Order: [id number]) - 01/27/2020  IMPRESSION:   No acute intracranial process.    CTA Head & Neck (Order: [id number]) - 01/27/2020  IMPRESSION:   No discrete high-grade extracranial or intracranial stenosis  or occlusion.    Social History:  Lives alonein an apartment.  Entry Steps: 1 flight, no elevator accessRails: yesInside steps: 0Rails: 0  Equipment at home: tub/shower combination  Prior Level of Function: indep all ADL/IADL, no DME/AE at home, cooks, drives  Cognition: WFL  Mobility: independent  Feeding: independent  Grooming: independent  Bathing: independent  Dressing: independent  Toileting: independent    Subjective   Patient is agreeable to participation in the therapy session. Nursing clears patient for therapy.  Patient's Goal:  To get medication for my syptoms  Pain: Pt unable to quantify  Location: R side of back  Therapist Intervention: positioned for comfort and RN notified of pt's request for pain medication  Patient is satisfied with therapist intervention.    Objective     Precautions/ Contraindications:   Precautions  Weight Bearing Status: no restrictions  Other Precautions: falls    Patient is in bed with  Telemetry and Intravenous Access in place.        Observation of patient/vitals:      Vitals:    01/28/20 0745 01/28/20 0750 01/28/20 1204 01/28/20 1600   BP:  156/74 151/75 155/67   Pulse: (Abnormal) 58 (Abnormal) 57 (Abnormal) 53 (Abnormal) 58   Resp: 18  18 18    Temp: 98.2 F (36.8 C)  98.1 F (36.7 C) 98.4 F (36.9 C)   TempSrc: Oral  Oral Oral   SpO2: 98% 97% 99% 99%   Weight:       Height:           Orientation/Cognition:  Alert and Oriented x 4  Cognition: follows all commands    Musculoskeletal Examination:      ROM Strength   RLE WFL Functionally at least 4/5 (formal MMT was not accurate due to  pt's poor effort)   LLE WFL Functionally at least 4/5 (formal MMT was not accurate due to pt's poor effort)       Sensation: Intact to light touch, denies numbness/tingling to BLEs   Coordination: fair+      Functional Mobility:  Rolling: mod indep    Supine to sit: mod indep  Scooting: mod indep  Sit to Supine: mod indep  Sit to stand: mod indep  Stand to sit: mod indep  Transfers: mod indep  Ambulation:     Weightbearing: no restrictions   Assistance level: mod indep   Distance: 200'   Assistive Device: none   Gait Deviations: steady gait but very slow cadence   Stairs: mod indep, up/down full flight    Balance:  Static Sit: good  Dynamic Sit: fair+  Static Stand: fair+  Dynamic Stand: fair    Endurance: good    Participation:  good    Education:  Educated the patient to role of physical therapy, plan of care, goals  of therapy and safety with mobility and ADLs.    Patient is in bed with all needs met, and equipment intact.  RN notified of session outcome.  Safety measures include: handoff to nurse/clin tech/ unit secretary completed, bed alarm activated, oriented to call bell and placed within reach, personal items within reach, assistive device positioned out of reach and bed placed in lowest position.  Mobility status posted at bedside and within E.M.R.               Goals  Goal Formulation: With patient(pt is at/near her baseline, no PT needs. D/C PT. )       Therapist PPE during session procedural mask, goggles  and gloves       Signature:  Sedonia Small, PT  01/28/2020  4:14 PM  Phone: [phone]     (For scheduling questions, please contact rehab tech [phone])    Attention MD:   Thank you for allowing [place] to participate in the care of [facility]. Regulations from the Center for Medicare and Medicaid Services (CMS) require your review and approval of this plan of care.     Please cosign this note indicating you are in agreement with theTherapy Plan of Care so we may initiate the therapy treatment plan.

## 2020-01-28 NOTE — Plan of Care (Signed)
Problem: Safety  Goal: Patient will be free from injury during hospitalization  Outcome: Progressing  Flowsheets (Taken 01/28/2020 0544)  Patient will be free from injury during hospitalization:   Assess patient's risk for falls and implement fall prevention plan of care per policy   Provide and maintain safe environment   Use appropriate transfer methods   Ensure appropriate safety devices are available at the bedside   Include patient/ family/ care giver in decisions related to safety   Hourly rounding     Problem: Moderate/High Fall Risk Score >5  Goal: Patient will remain free of falls  Outcome: Progressing  Flowsheets (Taken 01/27/2020 2100)  Moderate Risk (6-13):   MOD-Apply bed exit alarm if patient is confused   MOD-Remain with patient during toileting   MOD-Request PT/OT consult order for patients with gait/mobility impairment   MOD-include family in multidisciplinary POC discussions  Note: Assessed patient as a low fall risk. Fall prevention interventions implemented. Bed low to floor, call bell and table close to patient, patient reminded to use call bell before ambulating.  Bed alarm on  Hourly rounding completed with patient needs assessed at each encounter by care team members.       Problem: Day of Admission - Stroke  Goal: Core/Quality measure requirements - Admission  Outcome: Progressing  Flowsheets (Taken 01/28/2020 0544)  Core/Quality measure requirements - Admission:   Document NIH Stroke Scale on admission   Document nursing swallow/dysphagia screen on admission. If patient fails, keep patient NPO (follow your hospital protocol on swallowing screening).   VTE Prevention: Ensure anticoagulant(s) administered and/or anti-embolism stockings/devices documented as ordered   Ensure antithrombotic administered or contraindication documented by LIP   If diagnosis or history of Atrial Fib/Atrial Flutter, ensure oral anticoagulation is initiated or contraindication documented by LIP   Begin stroke  education on admission (must include Modifiable Risk Factors, Warning Signs and Symptoms of Stroke, Activation of Emergency Medical System and Follow-up Appointments) Ensure handout has been given and documented.   Ensure PT/OT and/or SLP ordered  Note: Patient admitted with left side weakness.  NIH stroke scale completed on admission.  Patient stated no numbness  Patient reports decreased sensation to LUE.

## 2020-01-28 NOTE — UM Notes (Signed)
.  Mallie Darting RN, BSN @ [phone].       [birth year]    74 YO W/ H/O BREAST CA, GERD, HLD, HTN, HYPOTHY, VERTIGO PRESENTED TO THE ER W/ C/O (L) SIDED NUMBNESS & WEAKNESS    BP: 153/69, Heart Rate: 63, Temp: 98.6 F (37 C), Resp Rate: 16, SpO2: 99 %, Weight: 75.7 kg    Neurological:      Mental Status: She is alert and oriented to person, place, and time.      Cranial Nerves: No cranial nerve deficit.      Motor: Pronator drift ( Left-sided) present.     CTA HEAD  No discrete high-grade extracranial or intracranial stenosis  or occlusion.    CT PERFUSION BRAIN  Normal study with no evidence of cerebral ischemia or  infarction of the major cerebral territories.    CT HEAD  No acute intracranial process    Medication Administration from 01/27/2020 1446 to 01/27/2020 2044     Date/Time Order Dose Route Action Action by Comments    01/27/2020 1522 iodixanol (VISIPAQUE) 320 MG/ML injection 150 mL 150 mL Intravenous Imaging Agent Given Sheldon Silvan     01/27/2020 1645 aspirin tablet 325 mg 325 mg Oral Not Given Roselind Rily, RN Patient endorses previously taking four baby aspirin prior to arrival.     MD aware.    01/27/2020 1911 pantoprazole (PROTONIX) EC tablet 40 mg 40 mg Oral Not Given Scherrie November, RN Pt to take this before breakfast     Place (admit) for Observation Services (Order [id number])  Admission  Date: 01/27/2020 Department: [facility] Intermediate Care Ordering/Authorizing: Genia Hotter, MD    W/ DX  C/P, (L) SIDED NUMBNESS    PLAN  TELE  ASA/LIPITOR  NEURO CONSULT  CARDIOLOGY CONSULT  ECHO  TREND TROP        CARDIOLOGY CONSULT  Chest pain  -atypical.  Likely musculoskeletal vs neuropathy.  No cardiac workup is planned at this time.  -will see pt PRN    *Hypertension  -continue lisinopril and adjust as needed    *Hyperlipidemia      NEURO CONSULT  New onset of left face arm and leg numbness, ongoing since yesterday, with some worsening today unclear cause patient has had similar  presentations in the past with negative work-up  Chest pain  ? Unfortunately given focal symptoms, will repeat MRI  ? Aspirin for now  ? If indeed new ischemia seen further recommendations will be given  ? Further recommendations after MRI has been done

## 2020-01-28 NOTE — Progress Notes (Signed)
CARDIOLOGY HOSPITAL NOTE  Stefen Juba Alyson Locket, MD [facility]  Office phone:  [phone]     [facility] phone [phone]  [facility] phone [phone]    Date:  01/28/2020  8:23 AM    Patient:  Emily [name].  DOB: 11/15/1945.  74 y.o.  female  Attending:  Silis, Doree Albee, MD     ASSESSMENT & PLAN:     *Chest pain  -atypical.  Likely musculoskeletal vs neuropathy.  No cardiac workup is planned at this time.  -will see pt PRN    *Hypertension  -continue lisinopril and adjust as needed    *Hyperlipidemia    HISTORY OF PRESENT ILLNESS:   *74 y.o. female admitted on  01/27/2020   *Patient was seen in consultation at the request of Dr. Wilhelmina Mcardle    Patient presents with a complaint of tingling of the left arm and leg.  In addition she reports chest pain, sharp, upper left side, worse with palpation, no associated SOB.      She was seen by neurology and on MRI is pending (CT of the head showed no acute findings).     Prior cardiac studies included ECHO 04/17/2019 showing LVEF 60% with mild LVH, grade 1 diastolic dysfunction and no significant valvular abnormalities.  Nuclear study 04/17/2019 was normal without ischemia or scar      DIAGNOSTICS:   ECG:  27-Jan-2020 15:33:59 Poor quality tracing.   Normal sinus rhythm.   Normal QRS.   No ischemic ST changes.       PAST MEDICAL HISTORY:     Past Medical History:   Diagnosis Date   . Breast cancer 2014    right breast mastectomy   . GERD (gastroesophageal reflux disease)    . Hyperlipidemia    . Hypertension    . Hypothyroidism    . Malignant neoplasm of overlapping sites of right female breast 10/19/2015   . Vertigo      Past Surgical History:   Procedure Laterality Date   . HYSTERECTOMY     . MASTECTOMY Right 2015     ALLERGIES:     Allergies   Allergen Reactions   . Caffeine      Personal preference   . Morphine Itching   . Morphine And Related Itching and Nausea And Vomiting     dizzy      SOCIAL HISTORY:     Social History     Tobacco Use   .  Smoking status: Never Smoker   . Smokeless tobacco: Never Used   Substance Use Topics   . Alcohol use: Yes     Alcohol/week: 1.0 standard drinks     Types: 1 Cans of beer per week     Comment: socially       FAMILY HISTORY:   family history is not on file.    REVIEW OF SYSTEMS:                                                  In addition to systems noted in HPI:   General:   no fatigue.   Cardiac:   positive CP.   no palpitations.   Pulmonary:   no SOB.   Neuro:   no syncope.   Heme:   no bleeding.   All other systems reviewed  and are negative.       PHYSICAL EXAM:                                                                 BP 156/74   Pulse (!) 57   Temp 98.2 F (36.8 C) (Oral)   Resp 18   Ht 1.6 m (5\' 3" )   Wt 71.6 kg (157 lb 12.8 oz)   SpO2 97%   BMI 27.95 kg/m     >> General:   no distress,  >> Eyes:   anicteric,  >> ENMT:   moist mucosa,  >> Respiratory:   unlabored respiration,  no wheeze,  no crackles,  >> Cardiovascular:   regular rhythm,  No LE edema,  >> Gastrointestinal:   Abdomen soft and nontender,  >> Musculoskeletal:   normal muscle tone and strength upper extremities >> Skin:   warm and dry,  >> Neuro/Psych:   Alert,  Appropriate mood and affect,     LABORATORY:     CBC w/Diff   Recent Labs   Lab 01/27/20  1531   WBC 6.45   Hgb 10.2*   Hematocrit 32.9*   Platelets 251        Basic Metabolic Profile   Recent Labs   Lab 01/27/20  1532   Sodium 138   Potassium 4.0   Chloride 105   CO2 26   BUN 11   Creatinine 0.8   EGFR >60.0   Glucose 101*   Calcium 8.1        Cardiac Enzymes   Recent Labs   Lab 01/28/20  0117 01/27/20  2351 01/27/20  1531   Troponin I 0.01 <0.01 <0.01            MEDICATIONS:    INFUSION MEDS:      SCHEDULED MEDS:     Current Facility-Administered Medications   Medication Dose Route Frequency   . aspirin  325 mg Oral Daily   . heparin (porcine)  5,000 Units Subcutaneous Q12H [facility]   . levothyroxine  25 mcg Oral Daily at 0600   . lisinopril  20 mg Oral BID   . pantoprazole   40 mg Oral QAM AC   . rosuvastatin  20 mg Oral Daily   . thiamine  100 mg Oral QAM      PRN MEDS:   acetaminophen **OR** acetaminophen, calcium carbonate, ibuprofen, magnesium hydroxide, meclizine, naloxone, ondansetron **OR** ondansetron, traZODone

## 2020-01-28 NOTE — Progress Notes (Signed)
01/28/20 1812   Healthcare Decisions   Interviewed: Patient   Orientation/Decision Making Abilities of Patient Alert and Oriented x3, able to make decisions   Advance Directive Patient does not have advance directive   Healthcare Agent Appointed No   Prior to admission   Prior level of function Independent with ADLs;Ambulates independently   Type of Residence Private residence   Home Layout One level   Have running water, electricity, heat, etc? Yes   Living Arrangements Alone   Dressing Independent   Grooming Independent   Feeding Independent   Bathing Independent   Toileting Independent   Name of Prior Assisted Living Facility NA   Discharge Planning   Support Systems Children   Patient expects to be discharged to: home, possible HHC   Mode of transportation: Private car (family member)   Does the patient have perscription coverage? Yes   Consults/Providers   PT Evaluation Needed 1   OT Evalulation Needed 1   SLP Evaluation Needed 2   Correct PCP listed in Epic? Yes   Family and PCP   PCP on file was verified as the current PCP? Yes   In case you are admitted, would like family notified? Yes   In case you are admitted, would like your PCP notified? Yes   Important Message from [facility] Notice   Patient received 1st IMM Letter? n/a

## 2020-01-28 NOTE — Progress Notes (Signed)
Emily [name] MRN: [id number]  74 y.o.  female    NUTRITION:  Reason for assessment: MST=2; recent weight loss and decreased appetite  No current weight loss noted   NPO and not able to assess po intake    Subjective: I have no appetite. My weight was 162# and now it is 157.52    Assessment   Past Medical History:   Diagnosis Date   . Breast cancer 2014    right breast mastectomy   . GERD (gastroesophageal reflux disease)    . Hyperlipidemia    . Hypertension    . Hypothyroidism    . Malignant neoplasm of overlapping sites of right female breast 10/19/2015   . Vertigo      Wt Readings from Last 30 Encounters:   01/27/20 71.6 kg (157 lb 12.8 oz)   01/27/20 75.7 kg (166 lb 14.2 oz)   12/28/19 71.5 kg (157 lb 10.1 oz)   12/10/19 71.7 kg (158 lb)   08/14/19 68.6 kg (151 lb 3.2 oz)   08/08/19 68 kg (150 lb)   07/01/19 69.4 kg (153 lb)   04/15/19 65.5 kg (144 lb 8 oz)   03/15/19 70.3 kg (155 lb)   01/11/19 71.6 kg (157 lb 13.6 oz)   01/01/19 71.6 kg (157 lb 13.6 oz)   11/13/18 71.8 kg (158 lb 3.2 oz)   11/13/18 74 kg (163 lb 2.3 oz)   09/13/18 73.5 kg (162 lb)   07/18/18 73.9 kg (163 lb)   06/20/18 73.5 kg (162 lb)   05/14/18 73.3 kg (161 lb 8 oz)   05/09/18 74.4 kg (164 lb)   04/03/18 74.1 kg (163 lb 4.8 oz)   03/15/18 73.9 kg (163 lb)   02/07/18 75.6 kg (166 lb 10.7 oz)   01/19/18 73.5 kg (162 lb)   12/13/17 76.1 kg (167 lb 12.8 oz)   09/25/17 76.2 kg (168 lb)   09/13/17 75.1 kg (165 lb 9.1 oz)   07/10/17 75.4 kg (166 lb 3.6 oz)   06/23/17 76 kg (167 lb 9.6 oz)   05/27/17 74 kg (163 lb 3.2 oz)   05/26/17 73.5 kg (162 lb)   05/26/17 73.5 kg (162 lb)     Social History     Socioeconomic History   . Marital status: Widowed     Spouse name: Not on file   . Number of children: Not on file   . Years of education: Not on file   . Highest education level: Not on file   Occupational History   . Not on file   Tobacco Use   . Smoking status: Never Smoker   . Smokeless tobacco: Never Used   Substance and Sexual Activity   . Alcohol  use: Yes     Alcohol/week: 1.0 standard drinks     Types: 1 Cans of beer per week     Comment: socially   . Drug use: No   . Sexual activity: Not on file   Other Topics Concern   . Not on file   Social History Narrative   . Not on file     Social Determinants of Health     Financial Resource Strain:    . Difficulty of Paying Living Expenses:    Food Insecurity:    . Worried About Programme researcher, broadcasting/film/video in the Last Year:    . [employer] of Food in the Last Year:    Transportation Needs:    . Lack of  Transportation (Medical):    Marland Kitchen Lack of Transportation (Non-Medical):    Physical Activity:    . Days of Exercise per Week:    . Minutes of Exercise per Session:    Stress:    . Feeling of Stress :    Social Connections:    . Frequency of Communication with Friends and Family:    . Frequency of Social Gatherings with Friends and Family:    . Attends Religious Services:    . Active Member of Clubs or Organizations:    . Attends Banker Meetings:    Marland Kitchen Marital Status:    Intimate Partner Violence:    . Fear of Current or Ex-Partner:    . Emotionally Abused:    Marland Kitchen Physically Abused:    . Sexually Abused:        Active Hospital Problems    Diagnosis   . Left-sided weakness     Allergies   Allergen Reactions   . Caffeine      Personal preference   . Morphine Itching   . Morphine And Related Itching and Nausea And Vomiting     dizzy     GI Symptoms: c/o poor appetie  Skin: No rash and no skin lesion    Orders Placed This Encounter   Procedures   . Diet cardiac      Current Meds:  aspirin, 81 mg, Daily  heparin (porcine), 5,000 Units, Q12H [facility]  levothyroxine, 25 mcg, Daily at 0600  lisinopril, 20 mg, BID  pantoprazole, 40 mg, QAM AC  rosuvastatin, 20 mg, Daily  thiamine, 100 mg, QAM        Recent Labs:  Recent Labs   Lab 01/27/20  1531   WBC 6.45   Hgb 10.2*   Hematocrit 32.9*   MCV 88.4   Platelets 251     Recent Labs   Lab 01/27/20  1532   Sodium 138   Potassium 4.0   Chloride 105   CO2 26   BUN 11   Creatinine 0.8    Glucose 101*   Calcium 8.1   EGFR >60.0     Recent Labs   Lab 01/27/20  1532   Albumin 3.3*       Intake/Output Summary (Last 24 hours) at 01/28/2020 1321  Last data filed at 01/27/2020 2235  Gross per 24 hour   Intake --   Output 350 ml   Net -350 ml         Anthropometrics  Height: 160 cm (5\' 3" )  Weight: 71.6 kg (157 lb 12.8 oz)  Weight Change: -5.45  IBW/kg (Calculated) Female: 56.37 kg  IBW/kg (Calculated) Female: 52.27 kg  BMI (calculated): 28    Estimated Nutrition Needs:  Estimated Energy Needs  Total Energy Estimated Needs: 1600-1900 cal  Method for Estimating Needs: IBW x (25-30) cal    Estimated Protein Needs  Total Protein Estimated Needs: 64-77g  Method for Estimating Needs: IBW x (1-1.2)g    Estimated Carbohydrate Needs  Total Carbohydrate Estimated Needs: 200-237g  Method for Estimating Needs: 50% of caloric needs    Fluid Needs  Total Fluid Estimated Needs: 1600 ml  Method for Estimating Needs: IBW x 25 ml      Learning & Discharge Planning Needs: None  Religious/Cultural Food Practices: None    Nutrition Diagnosis:   No nutrition diagnosis    Intervention:  Recommend: cardiac diet when medically appropriate    Goals:  Pt eats 75% or more x 3  meals to meet estimated needs    M/E:  Monitor: po intake and labs  Follow up 02/06/20    Lurline Hare MS. RD [address] 1610  01/28/20 @ 1331

## 2020-01-28 NOTE — Progress Notes (Signed)
Date Time: 01/28/20 12:35 PM  Patient Name: Emily [name]  Attending Physician: Mechele Dawley, MD  Patient Class: Observation  Hospital Day: 1            NEUROLOGY PROGRESS NOTE             Assessment/Plan   Left face arm and leg numbness currently resolved  Right-sided body pain since this morning  Chest pain    LDL 119 globin A1c 5.7 CT angios CT perfusion studies are negative    Wait  on brain MRI  If Studies are negative consider trial with coenzyme every 10, will also check CPK levels in case symptoms are coming from statin use especially the pain  If this is indeed showing ischemia further recommendations will be given  Outpatient follow-up with [place] in the office to help address musculoskeletal symptoms, may need EMG nerve conduction study or a C-spine MRI at some point---> office information personally handed to patient.  asa      Subjective:   Patient Seen and Examined.  Feels much better left-sided symptoms are gone but she feels pain on the right side today arm and leg and trunk    Review of Systems:   No headache, eye, ear nose, throat problems; no coughing or wheezing or shortness of breath, No chest pain or orthopnea, no abdominal pain, nausea or vomiting, No pain in the body or extremities, no psychiatric, neurological, endocrine, hematological or cardiac complaints except as noted above.   Physical Exam:   BP 151/75   Pulse (!) 53   Temp 98.1 F (36.7 C) (Oral)   Resp 18   Ht 1.6 m (5\' 3" )   Wt 71.6 kg (157 lb 12.8 oz)   SpO2 99%   BMI 27.95 kg/m   Lying in bed awake and alert following all commands smile symmetric tongue midline moving both arms and legs equally for me  Meds:      Scheduled Meds: PRN Meds:    heparin (porcine), 5,000 Units, Subcutaneous, Q12H [facility]  levothyroxine, 25 mcg, Oral, Daily at 0600  lisinopril, 20 mg, Oral, BID  pantoprazole, 40 mg, Oral, QAM AC  rosuvastatin, 20 mg, Oral, Daily  thiamine, 100 mg, Oral, QAM        Continuous Infusions:   acetaminophen, 650 mg, Q6H  PRN   Or  acetaminophen, 650 mg, Q6H PRN  calcium carbonate, 1,000 mg, Q6H PRN  ibuprofen, 600 mg, Q6H PRN  magnesium hydroxide, 30 mL, Daily PRN  meclizine, 25 mg, TID PRN  naloxone, 0.2 mg, PRN  ondansetron, 4 mg, Q6H PRN   Or  ondansetron, 4 mg, Q6H PRN  traZODone, 50 mg, QHS PRN                Labs:     Recent Labs   Lab 01/27/20  1532   Glucose 101*   BUN 11   Creatinine 0.8   Calcium 8.1   Sodium 138   Potassium 4.0   Chloride 105   CO2 26   Albumin 3.3*   AST (SGOT) 19   ALT 10   Bilirubin, Total 0.3   Alkaline Phosphatase 62     Recent Labs   Lab 01/27/20  1531   WBC 6.45   Hgb 10.2*   Hematocrit 32.9*   MCV 88.4   MCH 27.4   MCHC 31.0*   Platelets 251         Recent Labs     01/27/20  2351  PTT 37   PT 12.0   PT INR 1.0          Radiology Results (24 Hour)     ** No results found for the last 24 hours. **           All brain imaging (MRI, CT) personally reviewed.    Case discussed with: pt       This note was generated by the Epic EMR system/ Dragon speech recognition and may contain inherent errors or omissions not intended by the user. Grammatical errors, random word insertions, deletions and pronoun errors  are occasional consequences of this technology due to software limitations.     Not all errors are caught or corrected. If there are questions or concerns about the content of this note or information contained within the body of this dictation they should be addressed directly with the author for clarification.      Signed by: Cathe Mons, MD  Spectralink: [id number]      Answering Service: [phone]

## 2020-01-28 NOTE — Plan of Care (Signed)
Problem: Every Day - Stroke  Goal: Neurological status is stable or improving  Flowsheets (Taken 01/28/2020 1658)  Neurological status is stable or improving:   Monitor/assess/document neurological assessment (Stroke: every 4 hours)   Monitor/assess NIH Stroke Scale   Re-assess NIH Stroke Scale for any change in status  Goal: Nutritional intake is adequate  Flowsheets (Taken 01/28/2020 1658)  Nutritional intake is adequate:   Allow adequate time for meals   Assist patient with meals/food selection     Problem: Every Day - Stroke  Goal: Nutritional intake is adequate  Flowsheets (Taken 01/28/2020 1658)  Nutritional intake is adequate:   Allow adequate time for meals   Assist patient with meals/food selection   The learning abilities of the patient and/or caregiver have been assessed. Today's individualized plan of care includes neuro assessment,vital signs,NIHSS,swallowing The patient or caregiver states the following personal goal related to the patient's deficit(s): "By discharge I want to be able to know the signs of stroke." The plan of care was discussed with the patient and/or caregiver, who agrees to it and demonstrates understanding of the disease process, risk factors, treatment plan, medications and consequences of noncompliance. All questions and concerns were addressed.

## 2020-01-29 LAB — ECG 12-LEAD
Atrial Rate: 61 {beats}/min
P Axis: 46 degrees
P-R Interval: 198 ms
Q-T Interval: 394 ms
QRS Duration: 94 ms
QTC Calculation (Bezet): 396 ms
R Axis: 39 degrees
T Axis: 0 degrees
Ventricular Rate: 61 {beats}/min

## 2020-01-29 MED ORDER — LEVOTHYROXINE SODIUM 25 MCG PO TABS
25.0000 ug | ORAL_TABLET | Freq: Every morning | ORAL | 0 refills | Status: DC
Start: 2020-01-29 — End: 2024-02-08

## 2020-01-29 MED ORDER — PANTOPRAZOLE SODIUM 40 MG PO TBEC
40.00 mg | DELAYED_RELEASE_TABLET | Freq: Every morning | ORAL | 0 refills | Status: DC
Start: 2020-01-29 — End: 2022-05-12

## 2020-01-29 MED ORDER — ASPIRIN 81 MG PO CHEW: 81.0000 mg | CHEWABLE_TABLET | Freq: Every day | ORAL | 0 refills | Status: AC

## 2020-01-29 NOTE — Discharge Instr - AVS First Page (Addendum)
Reason for your Hospital Admission:  Numbness tingling resolved  Atypical chest pain resolved      Instructions for after your discharge:  Cont aspirin and protonix  Follow up with primary md

## 2020-01-29 NOTE — Plan of Care (Signed)
The learning abilities of the patient have been assessed. Today's individualized plan of care includes neuro check every 4 hours. The patient states the following personal goal related to the patient's deficit(s): "By discharge I want to be able to recognize the stroke signs and symptoms." The plan of care was discussed with the patient , who agrees to it and demonstrates understanding of the disease process, risk factors, treatment plan, medications and consequences of noncompliance. All questions and concerns were addressed.  Problem: Safety  Goal: Patient will be free from injury during hospitalization  Outcome: Progressing  Flowsheets (Taken 01/29/2020 0151)  Patient will be free from injury during hospitalization:  . Assess patient's risk for falls and implement fall prevention plan of care per policy  . Ensure appropriate safety devices are available at the bedside  Goal: Patient will be free from infection during hospitalization  Outcome: Progressing  Flowsheets (Taken 01/29/2020 0151)  Free from Infection during hospitalization:  . Encourage patient and family to use good hand hygiene technique  . Assess and monitor for signs and symptoms of infection  . Monitor lab/diagnostic results  . Monitor all insertion sites (i.e. indwelling lines, tubes, urinary catheters, and drains)     Problem: Side Effects from Pain Analgesia  Goal: Patient will experience minimal side effects of analgesic therapy  Outcome: Progressing     Problem: Psychosocial and Spiritual Needs  Goal: Demonstrates ability to cope with hospitalization/illness  Outcome: Progressing  Flowsheets (Taken 01/29/2020 0151)  Demonstrates ability to cope with hospitalizations/illness: Provide quiet environment     Problem: Every Day - Stroke  Goal: Core/Quality measure requirements - Daily  Outcome: Progressing  Flowsheets (Taken 01/29/2020 0151)  Core/Quality measure requirements - Daily:  . Continue stroke education (must include Modifiable Risk Factors,  Warning Signs and Symptoms of Stroke, Activation of Emergency Medical System and Follow-up Appointments). Ensure handout has been given and documented.  . VTE Prevention: Ensure anticoagulant(s) administered and/or anti-embolism stockings/devices documented by end of day 2

## 2020-01-29 NOTE — Plan of Care (Signed)
Problem: Safety  Goal: Patient will be free from injury during hospitalization  Outcome: Adequate for Discharge  Goal: Patient will be free from infection during hospitalization  Outcome: Adequate for Discharge     Problem: Pain  Goal: Pain at adequate level as identified by patient  Outcome: Adequate for Discharge     Problem: Discharge Barriers  Goal: Patient will be discharged home or other facility with appropriate resources  Outcome: Adequate for Discharge     Problem: Psychosocial and Spiritual Needs  Goal: Demonstrates ability to cope with hospitalization/illness  Outcome: Adequate for Discharge     Problem: Discharge Barriers  Goal: Patient will be discharged home or other facility with appropriate resources  Outcome: Adequate for Discharge     Problem: Day of Admission - Stroke  Goal: Core/Quality measure requirements - Admission  Outcome: Adequate for Discharge     Problem: Every Day - Stroke  Goal: Core/Quality measure requirements - Daily  Outcome: Adequate for Discharge  Goal: Neurological status is stable or improving  Outcome: Adequate for Discharge  Goal: Stable vital signs and fluid balance  Outcome: Adequate for Discharge  Goal: Patient will maintain adequate oxygenation  Outcome: Adequate for Discharge  Goal: Patient's risk of aspiration will be minimized  Outcome: Adequate for Discharge  Goal: Nutritional intake is adequate  Outcome: Adequate for Discharge  Goal: Elimination patterns are normal or improving  Outcome: Adequate for Discharge  Goal: Mobility/Activity is maintained at optimal level for patient  Outcome: Adequate for Discharge  Goal: Skin integrity is maintained or improved  Outcome: Adequate for Discharge  Goal: Neurovascular status is stable or improving  Outcome: Adequate for Discharge  Goal: Effective coping demonstrated  Outcome: Adequate for Discharge  Goal: Will be able to express needs and understand communication  Outcome: Adequate for Discharge

## 2020-01-29 NOTE — Progress Notes (Signed)
Date Time: 01/29/20 10:19 AM  Patient Name: Emily [name]  Attending Physician: Mechele Dawley, MD  Patient Class: Observation  Hospital Day: 1            NEUROLOGY PROGRESS NOTE             Assessment/Plan   Left face arm and leg numbness currently resolved  Right-sided body pain since this morning  Chest pain    LDL 119 globin A1c 5.7 CT angios CT perfusion studies are negative    Okay to discharge given brain MRI is negative  Outpatient follow-up with [place] in the office to help address musculoskeletal symptoms, may need EMG nerve conduction study or a C-spine MRI at some point---> office information personally handed to patient.  asa      Subjective:   Patient Seen and Examined.  Feels much better no new symptoms reported today    Review of Systems:   No headache, eye, ear nose, throat problems; no coughing or wheezing or shortness of breath, No chest pain or orthopnea, no abdominal pain, nausea or vomiting, No pain in the body or extremities, no psychiatric, neurological, endocrine, hematological or cardiac complaints except as noted above.   Physical Exam:   BP 146/69   Pulse 73   Temp 98.5 F (36.9 C) (Oral)   Resp 18   Ht 1.6 m (5\' 3" )   Wt 71.6 kg (157 lb 12.8 oz)   SpO2 100%   BMI 27.95 kg/m   Sitting at edge of bed awake alert oriented smile symmetric moving both upper and lower extremities well  Meds:      Scheduled Meds: PRN Meds:    aspirin, 81 mg, Oral, Daily  heparin (porcine), 5,000 Units, Subcutaneous, Q12H [facility]  levothyroxine, 25 mcg, Oral, Daily at 0600  lisinopril, 20 mg, Oral, BID  pantoprazole, 40 mg, Oral, QAM AC  rosuvastatin, 20 mg, Oral, Daily  thiamine, 100 mg, Oral, QAM        Continuous Infusions:   acetaminophen, 650 mg, Q6H PRN   Or  acetaminophen, 650 mg, Q6H PRN  calcium carbonate, 1,000 mg, Q6H PRN  ibuprofen, 600 mg, Q6H PRN  magnesium hydroxide, 30 mL, Daily PRN  meclizine, 25 mg, TID PRN  naloxone, 0.2 mg, PRN  ondansetron, 4 mg, Q6H PRN   Or  ondansetron, 4 mg, Q6H  PRN  traZODone, 50 mg, QHS PRN                Labs:     Recent Labs   Lab 01/27/20  1532   Glucose 101*   BUN 11   Creatinine 0.8   Calcium 8.1   Sodium 138   Potassium 4.0   Chloride 105   CO2 26   Albumin 3.3*   AST (SGOT) 19   ALT 10   Bilirubin, Total 0.3   Alkaline Phosphatase 62     Recent Labs   Lab 01/27/20  1531   WBC 6.45   Hgb 10.2*   Hematocrit 32.9*   MCV 88.4   MCH 27.4   MCHC 31.0*   Platelets 251         Recent Labs     01/27/20  2351   PTT 37   PT 12.0   PT INR 1.0          Radiology Results (24 Hour)     Procedure Component Value Units Date/Time    MRI Brain W WO Contrast [[id number]] Collected: 01/28/20 1616  Order Status: Completed Updated: 01/28/20 1634    Narrative:        Clinical indication: Stroke, follow-up    Comparison: MR brain from 04/16/2019.    Technique: Multiplanar, multisequence MRI of the brain was performed  with and without contrast. 7 cc of intravenous Gadavist was  administered.    Findings:    There is no evidence of acute ischemia or infarct. No extra-axial fluid  collection, mass effect or midline shift. There are mild scattered white  matter signal hyperintensities, likely signifying chronic microvascular  disease. There are mild involutional changes. No suspicious enhancement.  Intact globes. The vascular flow voids are identified. Clear paranasal  sinuses and mastoid air cells. No significant osseous or superficial  soft tissue abnormality.        Impression:       No acute intracranial findings or suspicious lesions.  Chronic involutional and white matter changes.    Wallie Renshaw, DO   01/28/2020 4:32 PM           All brain imaging (MRI, CT) personally reviewed.    Case discussed with: pt patient's nurse Ola      This note was generated by the Epic EMR system/ Dragon speech recognition and may contain inherent errors or omissions not intended by the user. Grammatical errors, random word insertions, deletions and pronoun errors  are occasional consequences of this  technology due to software limitations.     Not all errors are caught or corrected. If there are questions or concerns about the content of this note or information contained within the body of this dictation they should be addressed directly with the author for clarification.      Signed by: Emily Mons, MD  Spectralink: [id number]      Answering Service: [phone]

## 2020-01-29 NOTE — Progress Notes (Signed)
Neuro:AXOX3  Cardiac:SR  Respiratory:Lungs are clear  GI/GU:BRP with  Standby assistance    Plan:Discharge pt today,discharge instruction explained to PT,she verbalized understanding.

## 2020-01-29 NOTE — Progress Notes (Signed)
PROGRESS NOTE    Date Time: 01/29/20 7:44 AM  Patient Name: Emily [name], Emily [name]      Subjective:   Pt awake   Feeling better  Non numbness or tingling        Review of Systems:        No further numbess tingliing or weakness  Physical Exam:     Vitals:    01/29/20 0743   BP:    Pulse: 73   Resp:    Temp:    SpO2:        General appearance  Age appropriate  Mental status - alert, oriented to person, place, and time  Eyes - pupils equal and reactive, extraocular eye movements intact  Nose - normal and patent, no erythema, discharge or polyps  Mouth - mucous membranes moist, pharynx normal without lesions  Neck - supple, no significant adenopathy  Lymphatics - no palpable lymphadenopathy, no hepatosplenomegaly  Chest - clear to auscultation, no wheezes, rales or rhonchi, symmetric air entry  Heart - normal rate, regular rhythm, normal S1, S2, no murmurs, rubs, clicks or gallops  Abdomen - soft, nontender, nondistended, no masses or organomegaly  Neurological - alert, oriented, normal speech, no focal findings or movement disorder noted  Musculoskeletal - no joint tenderness, deformity or swelling  Extremities - peripheral pulses normal, no pedal edema, no clubbing or cyanosis  Skin - normal coloration and turgor, no rashes, no suspicious skin lesions noted    Medications:     Current Facility-Administered Medications   Medication Dose Route Frequency   . aspirin  81 mg Oral Daily   . heparin (porcine)  5,000 Units Subcutaneous Q12H [facility]   . levothyroxine  25 mcg Oral Daily at 0600   . lisinopril  20 mg Oral BID   . pantoprazole  40 mg Oral QAM AC   . rosuvastatin  20 mg Oral Daily   . thiamine  100 mg Oral QAM       Intake and Output Summary (Last 24 hours) at Date Time    Intake/Output Summary (Last 24 hours) at 01/29/2020 0744  Last data filed at 01/28/2020 1821  Gross per 24 hour   Intake 240 ml   Output --   Net 240 ml           Labs:     Recent Labs   Lab 01/27/20  1532   Sodium 138   Potassium 4.0   Chloride 105   CO2 26    BUN 11   Creatinine 0.8   Calcium 8.1   Albumin 3.3*   Protein, Total 5.3*   Bilirubin, Total 0.3   Alkaline Phosphatase 62   ALT 10   AST (SGOT) 19   Glucose 101*     Recent Labs   Lab 01/27/20  1531   WBC 6.45   Hgb 10.2*   Hematocrit 32.9*   Platelets 251               Rads:     Radiology Results (24 Hour)     Procedure Component Value Units Date/Time    MRI Brain W WO Contrast [[id number]] Collected: 01/28/20 1616    Order Status: Completed Updated: 01/28/20 1634    Narrative:        Clinical indication: Stroke, follow-up    Comparison: MR brain from 04/16/2019.    Technique: Multiplanar, multisequence MRI of the brain was performed  with and without contrast. 7 cc of intravenous Gadavist was  administered.  Findings:    There is no evidence of acute ischemia or infarct. No extra-axial fluid  collection, mass effect or midline shift. There are mild scattered white  matter signal hyperintensities, likely signifying chronic microvascular  disease. There are mild involutional changes. No suspicious enhancement.  Intact globes. The vascular flow voids are identified. Clear paranasal  sinuses and mastoid air cells. No significant osseous or superficial  soft tissue abnormality.        Impression:       No acute intracranial findings or suspicious lesions.  Chronic involutional and white matter changes.    Wallie Renshaw, DO   01/28/2020 4:32 PM            Assessment:     Patient is a 74 year old female with hypertension hyperlipidemia hypothyroidism GERD breast cancer with left-sided tingling and atypical chest pain      Plan:     MRI negative  Continue aspirin  Continue Protonix 40 mg daily  2D echo negative  Discharge home today if cleared by neurology      Signed by: Mechele Dawley, MD  01/29/2020  7:44 AM

## 2020-01-29 NOTE — Progress Notes (Signed)
01/29/20    [place] order to home placed 01/29/20     01/29/20 0914   Discharge Disposition   Patient preference/choice provided? Yes   Physical Discharge Disposition Home  (no needs)   Mode of Transportation Car  (family)   Bedside nurse notified of transport plan? Yes   CM Interventions   Follow up appointment scheduled? Yes  (prefers to schedule)   Referral made for home health RN visit? No, Other (comment)   Multidisciplinary rounds/family meeting before d/c? Yes   Medicare Checklist   Is this a Medicare patient? No

## 2020-02-25 ENCOUNTER — Ambulatory Visit (INDEPENDENT_AMBULATORY_CARE_PROVIDER_SITE_OTHER): Payer: No Typology Code available for payment source | Admitting: Cardiology

## 2020-02-27 ENCOUNTER — Ambulatory Visit (INDEPENDENT_AMBULATORY_CARE_PROVIDER_SITE_OTHER): Payer: Medicare Other | Admitting: Cardiology

## 2020-02-27 ENCOUNTER — Encounter (INDEPENDENT_AMBULATORY_CARE_PROVIDER_SITE_OTHER): Payer: Self-pay | Admitting: Cardiology

## 2020-02-27 VITALS — BP 137/70 | HR 65 | Temp 98.0°F | Resp 18 | Ht 63.0 in | Wt 159.0 lb

## 2020-02-27 DIAGNOSIS — E7800 Pure hypercholesterolemia, unspecified: Secondary | ICD-10-CM

## 2020-02-27 DIAGNOSIS — I10 Essential (primary) hypertension: Secondary | ICD-10-CM

## 2020-02-27 MED ORDER — EZETIMIBE 10 MG PO TABS
10.0000 mg | ORAL_TABLET | Freq: Every day | ORAL | 3 refills | Status: DC
Start: 2020-02-27 — End: 2020-10-20

## 2020-02-27 NOTE — Patient Instructions (Signed)
START ZETIA 10 MG DAILY FOR CHOLESTEROL    FASTING CHOLESTEROL IN 3 MONTHS    SEE ME 3 MONTHS

## 2020-02-27 NOTE — Progress Notes (Signed)
[place] CARDIOLOGY PROGRESS NOTE    I had the pleasure of seeing Emily [name] today for cardiovascular follow up.She is a pleasant 74 y.o. female with a history of atypical chest discomfort July 2020 that led to a normal Lexiscan nuclear scan and echocardiogram both July 2020 with ejection fraction of 60% and no significant valvular abnormality.  She has noted intermittent episodes of left shoulder and left arm pain discomfort with lifting her left arm.      MEDICATIONS:  Current Outpatient Medications   Medication Sig Dispense Refill   . anastrozole (ARIMIDEX) 1 MG tablet Take 1 tablet (1 mg total) by mouth daily (Patient taking differently: Take 1 mg by mouth every morning    ) 90 tablet 3   . aspirin 81 MG chewable tablet Chew 1 tablet (81 mg total) by mouth daily 90 tablet 0   . aspirin 81 MG EC tablet Take 81 mg by mouth every morning         . ezetimibe (ZETIA) 10 MG tablet Take 1 tablet (10 mg total) by mouth daily 90 tablet 3   . ibuprofen (ADVIL) 600 MG tablet Take 1 tablet (600 mg total) by mouth every 6 (six) hours as needed for Pain 30 tablet 0   . levothyroxine (SYNTHROID) 25 MCG tablet Take 1 tablet (25 mcg total) by mouth every morning 30 tablet 0   . lisinopril (ZESTRIL) 20 MG tablet Take 1 tablet (20 mg total) by mouth 2 (two) times daily (Patient taking differently: Take 40 mg by mouth daily   ) 180 tablet 3   . meclizine (ANTIVERT) 25 MG tablet Take 1 tablet (25 mg total) by mouth 3 (three) times daily as needed for Dizziness 10 tablet 0   . Multiple Vitamins-Minerals (MULTIVITAMIN WITH MINERALS) tablet Take 1 tablet by mouth every morning         . pantoprazole (PROTONIX) 40 MG tablet Take 1 tablet (40 mg total) by mouth every morning before breakfast 30 tablet 0   . rosuvastatin (CRESTOR) 40 MG tablet Take 0.5 tablets (20 mg total) by mouth daily 30 tablet 0   . vitamin B-1 (THIAMINE) 100 MG tablet Take 100 mg by mouth every morning.           REVIEW OF SYSTEMS: All other systems reviewed and  negative except as above.    PHYSICAL EXAMINATION  General Appearance: well-appearing and in no acute distress.   Vital Signs: BP 137/70 (BP Site: Left arm, Patient Position: Sitting, Cuff Size: Large)   Pulse 65   Temp 98 F (36.7 C)   Resp 18   Ht 1.6 m (5\' 3" )   Wt 72.1 kg (159 lb)   SpO2 98%   BMI 28.17 kg/m    HEENT: Sclera anicteric, conjunctiva without pallor, moist mucous membranes.  Neck: Supple without jugular venous distention.   Chest: Clear to auscultation bilaterally with good air movement and respiratory effort and no wheezes, rales, or rhonchi  Cardiovascular: Normal S1 and  S2 without murmurs, gallops or rub. PMI of normal size and nondisplaced.   Extremities: Warm without edema.  ECG: Sinus rhythm with normal EKG  Past Medical History:   Diagnosis Date   . Breast cancer 2014    right breast mastectomy   . GERD (gastroesophageal reflux disease)    . Hyperlipidemia    . Hypertension    . Hypothyroidism    . Malignant neoplasm of overlapping sites of right female breast 10/19/2015   .  Vertigo      Family History   Problem Relation Age of Onset   . Breast cancer Neg Hx      Social History     Socioeconomic History   . Marital status: Widowed     Spouse name: None   . Number of children: None   . Years of education: None   . Highest education level: None   Occupational History   . None   Tobacco Use   . Smoking status: Never Smoker   . Smokeless tobacco: Never Used   Substance and Sexual Activity   . Alcohol use: Yes     Alcohol/week: 1.0 standard drinks     Types: 1 Cans of beer per week     Comment: socially   . Drug use: No   . Sexual activity: None   Other Topics Concern   . None   Social History Narrative   . None     Social Determinants of Health     Financial Resource Strain:    . Difficulty of Paying Living Expenses:    Food Insecurity:    . Worried About Programme researcher, broadcasting/film/video in the Last Year:    . Barista in the Last Year:    Transportation Needs:    . Freight forwarder  (Medical):    Marland Kitchen Lack of Transportation (Non-Medical):    Physical Activity:    . Days of Exercise per Week:    . Minutes of Exercise per Session:    Stress:    . Feeling of Stress :    Social Connections:    . Frequency of Communication with Friends and Family:    . Frequency of Social Gatherings with Friends and Family:    . Attends Religious Services:    . Active Member of Clubs or Organizations:    . Attends Banker Meetings:    Marland Kitchen Marital Status:    Intimate Partner Violence:    . Fear of Current or Ex-Partner:    . Emotionally Abused:    Marland Kitchen Physically Abused:    . Sexually Abused:          IMPRESSION:  Noncardiac left arm sensation likely musculoskeletal  Hypertension controlled  Stable status    PLAN:   Return in 6 months      Royann Shivers, MD [facility]  02/27/2020

## 2020-03-04 ENCOUNTER — Encounter (INDEPENDENT_AMBULATORY_CARE_PROVIDER_SITE_OTHER): Payer: Self-pay

## 2020-03-08 ENCOUNTER — Emergency Department: Payer: Medicare Other

## 2020-03-08 ENCOUNTER — Emergency Department
Admission: EM | Admit: 2020-03-08 | Discharge: 2020-03-08 | Disposition: A | Discharge status: Home / Self Care | Payer: Medicare Other | Attending: Emergency Medicine | Admitting: Emergency Medicine

## 2020-03-08 DIAGNOSIS — K529 Noninfective gastroenteritis and colitis, unspecified: Secondary | ICD-10-CM

## 2020-03-08 DIAGNOSIS — R079 Chest pain, unspecified: Secondary | ICD-10-CM

## 2020-03-08 DIAGNOSIS — Z7982 Long term (current) use of aspirin: Secondary | ICD-10-CM | POA: Insufficient documentation

## 2020-03-08 DIAGNOSIS — I10 Essential (primary) hypertension: Secondary | ICD-10-CM | POA: Insufficient documentation

## 2020-03-08 DIAGNOSIS — E039 Hypothyroidism, unspecified: Secondary | ICD-10-CM | POA: Insufficient documentation

## 2020-03-08 DIAGNOSIS — R0789 Other chest pain: Secondary | ICD-10-CM | POA: Insufficient documentation

## 2020-03-08 DIAGNOSIS — F418 Other specified anxiety disorders: Secondary | ICD-10-CM

## 2020-03-08 DIAGNOSIS — E785 Hyperlipidemia, unspecified: Secondary | ICD-10-CM | POA: Insufficient documentation

## 2020-03-08 DIAGNOSIS — R1084 Generalized abdominal pain: Secondary | ICD-10-CM

## 2020-03-08 DIAGNOSIS — F419 Anxiety disorder, unspecified: Secondary | ICD-10-CM | POA: Insufficient documentation

## 2020-03-08 DIAGNOSIS — C50811 Malignant neoplasm of overlapping sites of right female breast: Secondary | ICD-10-CM

## 2020-03-08 DIAGNOSIS — R531 Weakness: Secondary | ICD-10-CM

## 2020-03-08 DIAGNOSIS — A419 Sepsis, unspecified organism: Secondary | ICD-10-CM

## 2020-03-08 DIAGNOSIS — Z9011 Acquired absence of right breast and nipple: Secondary | ICD-10-CM | POA: Insufficient documentation

## 2020-03-08 DIAGNOSIS — Z853 Personal history of malignant neoplasm of breast: Secondary | ICD-10-CM | POA: Insufficient documentation

## 2020-03-08 LAB — COMPREHENSIVE METABOLIC PANEL
ALT: 20 U/L (ref 0–55)
AST (SGOT): 34 U/L (ref 5–34)
Albumin/Globulin Ratio: 1.3 (ref 0.9–2.2)
Albumin: 4 g/dL (ref 3.5–5.0)
Alkaline Phosphatase: 81 U/L (ref 37–106)
Anion Gap: 10 (ref 5.0–15.0)
BUN: 12 mg/dL (ref 7–19)
Bilirubin, Total: 0.4 mg/dL (ref 0.2–1.2)
CO2: 28 mEq/L (ref 22–29)
Calcium: 9.2 mg/dL (ref 7.9–10.2)
Chloride: 104 mEq/L (ref 100–111)
Creatinine: 0.8 mg/dL (ref 0.6–1.0)
Globulin: 3.1 g/dL (ref 2.0–3.6)
Glucose: 106 mg/dL — ABNORMAL HIGH (ref 70–100)
Potassium: 5 mEq/L (ref 3.5–5.1)
Protein, Total: 7.1 g/dL (ref 6.0–8.3)
Sodium: 142 mEq/L (ref 136–145)

## 2020-03-08 LAB — CBC AND DIFFERENTIAL
Absolute NRBC: 0 10*3/uL (ref 0.00–0.00)
Basophils Absolute Automated: 0.03 10*3/uL (ref 0.00–0.08)
Basophils Automated: 0.4 %
Eosinophils Absolute Automated: 0.08 10*3/uL (ref 0.00–0.44)
Eosinophils Automated: 1.1 %
Hematocrit: 38.6 % (ref 34.7–43.7)
Hgb: 11.9 g/dL (ref 11.4–14.8)
Immature Granulocytes Absolute: 0.06 10*3/uL (ref 0.00–0.07)
Immature Granulocytes: 0.8 %
Lymphocytes Absolute Automated: 1.97 10*3/uL (ref 0.42–3.22)
Lymphocytes Automated: 27.4 %
MCH: 26.8 pg (ref 25.1–33.5)
MCHC: 30.8 g/dL — ABNORMAL LOW (ref 31.5–35.8)
MCV: 86.9 fL (ref 78.0–96.0)
MPV: 11.4 fL (ref 8.9–12.5)
Monocytes Absolute Automated: 0.58 10*3/uL (ref 0.21–0.85)
Monocytes: 8.1 %
Neutrophils Absolute: 4.47 10*3/uL (ref 1.10–6.33)
Neutrophils: 62.2 %
Nucleated RBC: 0 /100 WBC (ref 0.0–0.0)
Platelets: 288 10*3/uL (ref 142–346)
RBC: 4.44 10*6/uL (ref 3.90–5.10)
RDW: 13 % (ref 11–15)
WBC: 7.19 10*3/uL (ref 3.10–9.50)

## 2020-03-08 LAB — GFR: EGFR: >60

## 2020-03-08 LAB — TROPONIN I: Troponin I: <0.01 ng/mL (ref 0.00–0.05)

## 2020-03-08 MED ORDER — KETOROLAC TROMETHAMINE 30 MG/ML IJ SOLN
30.00 mg | Freq: Once | INTRAMUSCULAR | Status: AC
Start: 2020-03-08 — End: 2020-03-08
  Administered 2020-03-08: 30 mg via INTRAVENOUS
  Filled 2020-03-08: qty 1

## 2020-03-08 MED ORDER — IODIXANOL 320 MG/ML IV SOLN
100.00 mL | Freq: Once | INTRAVENOUS | Status: AC | PRN
Start: 2020-03-08 — End: 2020-03-08
  Administered 2020-03-08: 100 mL via INTRAVENOUS

## 2020-03-08 NOTE — Discharge Instructions (Signed)
Study results to go  Activity as tolerated  Advil/Motrin/Ibuprofen - 400 mg every eight hours for pain  Acetaminophen/Tylenol - 500 mg every four hours for pain  Continue current medications  Fluids - keep hydrated  Return if you are not improving or are worse

## 2020-03-08 NOTE — ED Provider Notes (Signed)
History   No chief complaint on file.    HPI     Time seen: 1527 - #04    Historian: patient (poor historian)    Chief complaint: right sided chest pain    HPI: the patient noted the onset of pain over the right side of the chest   under the right arm starting last night with no inciting event; she states   that she has no SSCP and denies any left-sided pain to me; she states   that she is very anxious (related to right breast cancer/mastectomy), with   nausea    Time of onset: last night    Severity: moderate     Quality: intermittent sharp pain that lasts for seconds to minutes, with   tenderness    Improved by: nothing     Worsened by: nothing - no induced or worse with chest movement    Accompanied by: intermittent left breast tingling; no fever/chills or cough   or SOB or SSCP    Complaint experienced before: yes; normal cardiac nuclear scan on   04/16/2020 and normal cardiac ECHO on 01/28/2020    Past Medical History:   Diagnosis Date   . Breast cancer 2014    right breast mastectomy   . GERD (gastroesophageal reflux disease)    . Hyperlipidemia    . Hypertension    . Hypothyroidism    . Malignant neoplasm of overlapping sites of right female breast 10/19/2015   . Vertigo      Past Surgical History:   Procedure Laterality Date   . HYSTERECTOMY     . MASTECTOMY Right 2015       Family History   Problem Relation Age of Onset   . Breast cancer Neg Hx      Social  Social History     Tobacco Use   . Smoking status: Never Smoker   . Smokeless tobacco: Never Used   Substance Use Topics   . Alcohol use: Yes     Alcohol/week: 1.0 standard drinks     Types: 1 Cans of beer per week     Comment: socially   . Drug use: No   .   Allergies   Allergen Reactions   . Caffeine      Personal preference   . Morphine Itching   . Morphine And Related Itching and Nausea And Vomiting     dizzy     Home Medications             anastrozole (ARIMIDEX) 1 MG tablet     Take 1 tablet (1 mg total) by mouth daily     Patient taking  differently: Take 1 mg by mouth every morning         aspirin 81 MG chewable tablet     Chew 1 tablet (81 mg total) by mouth daily     aspirin 81 MG EC tablet     Take 81 mg by mouth every morning         ezetimibe (ZETIA) 10 MG tablet     Take 1 tablet (10 mg total) by mouth daily     ibuprofen (ADVIL) 600 MG tablet     Take 1 tablet (600 mg total) by mouth every 6 (six) hours as needed for Pain     levothyroxine (SYNTHROID) 25 MCG tablet     Take 1 tablet (25 mcg total) by mouth every morning     lisinopril (ZESTRIL) 20 MG tablet  Take 1 tablet (20 mg total) by mouth 2 (two) times daily     Patient taking differently: Take 40 mg by mouth daily        meclizine (ANTIVERT) 25 MG tablet     Take 1 tablet (25 mg total) by mouth 3 (three) times daily as needed for Dizziness     Multiple Vitamins-Minerals (MULTIVITAMIN WITH MINERALS) tablet     Take 1 tablet by mouth every morning         pantoprazole (PROTONIX) 40 MG tablet     Take 1 tablet (40 mg total) by mouth every morning before breakfast     rosuvastatin (CRESTOR) 40 MG tablet     Take 0.5 tablets (20 mg total) by mouth daily     vitamin B-1 (THIAMINE) 100 MG tablet     Take 100 mg by mouth every morning.         Review of Systems   Constitutional: Negative for activity change, chills, diaphoresis and fever.   HENT: Negative for congestion, sore throat, trouble swallowing and voice change.    Eyes: Negative for photophobia, pain and visual disturbance.   Respiratory: Negative for cough, chest tightness, shortness of breath, wheezing and stridor.    Cardiovascular: Positive for chest pain. Negative for palpitations and leg swelling.   Gastrointestinal: Positive for nausea. Negative for abdominal pain and vomiting.   Endocrine: Negative for polydipsia and polyuria.   Genitourinary: Negative for dysuria, flank pain, frequency and pelvic pain.   Musculoskeletal: Negative for arthralgias, back pain, joint swelling, myalgias and neck pain.   Skin: Negative for  color change, pallor, rash and wound.   Neurological: Negative for dizziness, weakness, light-headedness and headaches.   Psychiatric/Behavioral: Negative for confusion and decreased concentration. The patient is nervous/anxious.      Phyical Exam       Physical Exam  Vitals and nursing note reviewed.   Constitutional:       General: She is awake. She is not in acute distress.     Appearance: Normal appearance. She is well-groomed. She is not ill-appearing, toxic-appearing or diaphoretic.   HENT:      Mouth/Throat:      Mouth: Mucous membranes are moist.      Pharynx: No oropharyngeal exudate or posterior oropharyngeal erythema.   Neck:      Vascular: No carotid bruit.   Cardiovascular:      Rate and Rhythm: Regular rhythm. Bradycardia present.      Heart sounds: Normal heart sounds.   Pulmonary:      Effort: Pulmonary effort is normal. No respiratory distress.      Breath sounds: Normal breath sounds. No stridor. No wheezing, rhonchi or rales.      Comments: Variable tenderness over the right lateral aspect   of the thorax, especially over well-healed surgical scars;   palpation induces the presenting symptom  Chest:      Chest wall: Tenderness present.   Abdominal:      General: There is no distension.      Tenderness: There is no abdominal tenderness. There is no right CVA tenderness, left CVA tenderness, guarding or rebound.   Musculoskeletal:         General: No swelling or tenderness.      Cervical back: Normal range of motion and neck supple. No signs of trauma, rigidity or tenderness. No pain with movement, spinous process tenderness or muscular tenderness.      Right lower leg: No edema.  Left lower leg: No edema.   Lymphadenopathy:      Cervical: No cervical adenopathy.   Skin:     General: Skin is warm and dry.      Coloration: Skin is not pale.      Findings: No erythema or rash.   Neurological:      General: No focal deficit present.      Mental Status: She is alert and oriented to person, place,  and time.      Motor: No weakness.   Psychiatric:         Mood and Affect: Mood is anxious.         Behavior: Behavior normal. Behavior is cooperative.         Thought Content: Thought content normal.         Judgment: Judgment normal.          MDM and ED Course     ED Medication Orders (From admission, onward)    Start Ordered     Status Ordering Provider    03/08/20 1754 03/08/20 1754  iodixanol (VISIPAQUE) 320 MG/ML injection 100 mL  IMG once as needed     Route: Intravenous  Ordered Dose: 100 mL     Last MAR action: Imaging Agent Given Terri Skains    03/08/20 1615 03/08/20 1614  ketorolac (TORADOL) injection 30 mg  Once in ED     Route: Intravenous  Ordered Dose: 30 mg     Last MAR action: Given Nayleah Gamel G         EKG - SB @ 56 with large voltage and NSTWA's - the NSTWA's are unchanged   from 04/15/2019, but the NSTWA's were not present on 01/27/2020 - this EKG was   interpreted in real time by Terri Skains - EDMD at [facility]    A discussion was held with the patient about the results   of the examination and the studies done and about the implications   of these results. A treatment and follow-up plan was developed and   agreed upon with the patient    MDM  Number of Diagnoses or Management Options     Amount and/or Complexity of Data Reviewed  Clinical lab tests: ordered and reviewed  Review and summarize past medical records: yes  Independent visualization of images, tracings, or specimens: yes (I have reviewed  the imaging study(s) and I agree with the interpretation(s) by the radiologist)    Risk of Complications, Morbidity, and/or Mortality  Presenting problems: moderate  Diagnostic procedures: high  Management options: minimal    Patient Progress  Patient progress: stable           Procedures    Clinical Impression & Disposition     Clinical Impression  Final diagnoses:   Right-sided chest pain   Musculoskeletal chest pain   S/P right mastectomy   Anxiety about health     ED Disposition     ED  Disposition Condition Date/Time Comment    Discharge  Sun Mar 08, 2020  7:38 PM Emily [name] discharge to home/self care.    Condition at disposition: Stable                      Terri Skains, MD  03/12/20 [phone]

## 2020-03-10 ENCOUNTER — Inpatient Hospital Stay
Admission: EM | Admit: 2020-03-10 | Discharge: 2020-03-20 | DRG: 872 | Disposition: A | Discharge status: Home / Self Care | Payer: Medicare Other | Attending: Internal Medicine | Admitting: Internal Medicine

## 2020-03-10 ENCOUNTER — Emergency Department: Payer: Medicare Other

## 2020-03-10 DIAGNOSIS — E860 Dehydration: Secondary | ICD-10-CM | POA: Diagnosis not present

## 2020-03-10 DIAGNOSIS — Z20822 Contact with and (suspected) exposure to covid-19: Secondary | ICD-10-CM | POA: Diagnosis present

## 2020-03-10 DIAGNOSIS — I10 Essential (primary) hypertension: Secondary | ICD-10-CM | POA: Diagnosis present

## 2020-03-10 DIAGNOSIS — Z79899 Other long term (current) drug therapy: Secondary | ICD-10-CM

## 2020-03-10 DIAGNOSIS — Z79811 Long term (current) use of aromatase inhibitors: Secondary | ICD-10-CM

## 2020-03-10 DIAGNOSIS — E785 Hyperlipidemia, unspecified: Secondary | ICD-10-CM | POA: Diagnosis present

## 2020-03-10 DIAGNOSIS — K529 Noninfective gastroenteritis and colitis, unspecified: Secondary | ICD-10-CM

## 2020-03-10 DIAGNOSIS — Z7982 Long term (current) use of aspirin: Secondary | ICD-10-CM

## 2020-03-10 DIAGNOSIS — R739 Hyperglycemia, unspecified: Secondary | ICD-10-CM | POA: Diagnosis present

## 2020-03-10 DIAGNOSIS — K219 Gastro-esophageal reflux disease without esophagitis: Secondary | ICD-10-CM | POA: Diagnosis present

## 2020-03-10 DIAGNOSIS — Z9011 Acquired absence of right breast and nipple: Secondary | ICD-10-CM

## 2020-03-10 DIAGNOSIS — Z853 Personal history of malignant neoplasm of breast: Secondary | ICD-10-CM

## 2020-03-10 DIAGNOSIS — A09 Infectious gastroenteritis and colitis, unspecified: Secondary | ICD-10-CM | POA: Diagnosis present

## 2020-03-10 DIAGNOSIS — A419 Sepsis, unspecified organism: Principal | ICD-10-CM | POA: Diagnosis present

## 2020-03-10 DIAGNOSIS — E876 Hypokalemia: Secondary | ICD-10-CM | POA: Diagnosis not present

## 2020-03-10 DIAGNOSIS — R531 Weakness: Secondary | ICD-10-CM

## 2020-03-10 DIAGNOSIS — E870 Hyperosmolality and hypernatremia: Secondary | ICD-10-CM | POA: Diagnosis not present

## 2020-03-10 DIAGNOSIS — E039 Hypothyroidism, unspecified: Secondary | ICD-10-CM | POA: Diagnosis present

## 2020-03-10 DIAGNOSIS — Z79890 Hormone replacement therapy (postmenopausal): Secondary | ICD-10-CM

## 2020-03-10 LAB — COMPREHENSIVE METABOLIC PANEL
ALT: 13 U/L (ref 0–55)
AST (SGOT): 19 U/L (ref 5–34)
Albumin/Globulin Ratio: 1.3 (ref 0.9–2.2)
Albumin: 3.9 g/dL (ref 3.5–5.0)
Alkaline Phosphatase: 74 U/L (ref 37–106)
Anion Gap: 12 (ref 5.0–15.0)
BUN: 10 mg/dL (ref 7–19)
Bilirubin, Total: 0.8 mg/dL (ref 0.2–1.2)
CO2: 26 mEq/L (ref 22–29)
Calcium: 8.9 mg/dL (ref 7.9–10.2)
Chloride: 101 mEq/L (ref 100–111)
Creatinine: 0.8 mg/dL (ref 0.6–1.0)
Globulin: 2.9 g/dL (ref 2.0–3.6)
Glucose: 126 mg/dL — ABNORMAL HIGH (ref 70–100)
Potassium: 4.1 mEq/L (ref 3.5–5.1)
Protein, Total: 6.8 g/dL (ref 6.0–8.3)
Sodium: 139 mEq/L (ref 136–145)

## 2020-03-10 LAB — CBC AND DIFFERENTIAL
Absolute NRBC: 0 10*3/uL (ref 0.00–0.00)
Basophils Absolute Automated: 0.03 10*3/uL (ref 0.00–0.08)
Basophils Automated: 0.1 %
Eosinophils Absolute Automated: 0 10*3/uL (ref 0.00–0.44)
Eosinophils Automated: 0 %
Hematocrit: 38 % (ref 34.7–43.7)
Hgb: 12.1 g/dL (ref 11.4–14.8)
Immature Granulocytes Absolute: 0.1 10*3/uL — ABNORMAL HIGH (ref 0.00–0.07)
Immature Granulocytes: 0.5 %
Lymphocytes Absolute Automated: 2.02 10*3/uL (ref 0.42–3.22)
Lymphocytes Automated: 9.7 %
MCH: 27.4 pg (ref 25.1–33.5)
MCHC: 31.8 g/dL (ref 31.5–35.8)
MCV: 86 fL (ref 78.0–96.0)
MPV: 11.4 fL (ref 8.9–12.5)
Monocytes Absolute Automated: 1.26 10*3/uL — ABNORMAL HIGH (ref 0.21–0.85)
Monocytes: 6 %
Neutrophils Absolute: 17.46 10*3/uL — ABNORMAL HIGH (ref 1.10–6.33)
Neutrophils: 83.7 %
Nucleated RBC: 0 /100 WBC (ref 0.0–0.0)
Platelets: 321 10*3/uL (ref 142–346)
RBC: 4.42 10*6/uL (ref 3.90–5.10)
RDW: 13 % (ref 11–15)
WBC: 20.87 10*3/uL — ABNORMAL HIGH (ref 3.10–9.50)

## 2020-03-10 LAB — URINALYSIS REFLEX TO MICROSCOPIC EXAM - REFLEX TO CULTURE
Bilirubin, UA: NEGATIVE
Blood, UA: NEGATIVE
Glucose, UA: NEGATIVE
Ketones UA: NEGATIVE
Nitrite, UA: NEGATIVE
Protein, UR: >=500 — AB
Specific Gravity UA: 1.023 (ref 1.001–1.035)
Urine pH: 8 (ref 5.0–8.0)
Urobilinogen, UA: NEGATIVE mg/dL (ref 0.2–2.0)

## 2020-03-10 LAB — GFR: EGFR: >60

## 2020-03-10 LAB — COVID-19 (SARS-COV-2) & INFLUENZA  A/B, NAA (ROCHE LIAT)
Influenza A: NOT DETECTED
Influenza B: NOT DETECTED
SARS CoV 2 Overall Result: NOT DETECTED

## 2020-03-10 LAB — LACTIC ACID, PLASMA: Lactic Acid: 1.1 mmol/L (ref 0.2–2.0)

## 2020-03-10 MED ORDER — DEXTROSE-SODIUM CHLORIDE 5-0.9 % IV SOLN
INTRAVENOUS | Status: AC
Start: 2020-03-10 — End: 2020-03-11

## 2020-03-10 MED ORDER — NALOXONE HCL 0.4 MG/ML IJ SOLN (WRAP)
0.20 mg | INTRAMUSCULAR | Status: DC | PRN
Start: 2020-03-10 — End: 2020-03-20

## 2020-03-10 MED ORDER — ALUM & MAG HYDROXIDE-SIMETH 200-200-20 MG/5ML PO SUSP
15.00 mL | ORAL | Status: DC | PRN
Start: 2020-03-10 — End: 2020-03-20

## 2020-03-10 MED ORDER — ANASTROZOLE 1 MG PO TABS
1.0000 mg | ORAL_TABLET | Freq: Every day | ORAL | Status: DC
  Administered 2020-03-10 – 2020-03-20 (×11): 1 mg via ORAL
  Filled 2020-03-10 (×11): qty 1

## 2020-03-10 MED ORDER — GLUCAGON 1 MG IJ SOLR (WRAP)
1.00 mg | INTRAMUSCULAR | Status: DC | PRN
Start: 2020-03-10 — End: 2020-03-20

## 2020-03-10 MED ORDER — KETOROLAC TROMETHAMINE 30 MG/ML IJ SOLN
30.00 mg | Freq: Four times a day (QID) | INTRAMUSCULAR | Status: AC | PRN
Start: 2020-03-10 — End: 2020-03-15
  Administered 2020-03-10 – 2020-03-12 (×4): 30 mg via INTRAVENOUS
  Filled 2020-03-10 (×4): qty 1

## 2020-03-10 MED ORDER — ROSUVASTATIN CALCIUM 10 MG PO TABS
20.0000 mg | ORAL_TABLET | Freq: Every day | ORAL | Status: DC
Start: 2020-03-10 — End: 2020-03-20
  Administered 2020-03-10 – 2020-03-20 (×11): 20 mg via ORAL
  Filled 2020-03-10 (×11): qty 2

## 2020-03-10 MED ORDER — ACETAMINOPHEN 325 MG PO TABS
650.0000 mg | ORAL_TABLET | Freq: Four times a day (QID) | ORAL | Status: DC | PRN
Start: 2020-03-10 — End: 2020-03-20
  Administered 2020-03-11 – 2020-03-19 (×3): 650 mg via ORAL
  Filled 2020-03-10: qty 2

## 2020-03-10 MED ORDER — MECLIZINE HCL 12.5 MG PO TABS
25.0000 mg | ORAL_TABLET | Freq: Three times a day (TID) | ORAL | Status: DC | PRN
Start: 2020-03-10 — End: 2020-03-20

## 2020-03-10 MED ORDER — THIAMINE (VITAMIN B1) 100 MG PO TABS (WRAP)
100.0000 mg | ORAL_TABLET | Freq: Every morning | ORAL | Status: DC
Start: 2020-03-10 — End: 2020-03-20
  Administered 2020-03-10 – 2020-03-20 (×11): 100 mg via ORAL
  Filled 2020-03-10 (×12): qty 1

## 2020-03-10 MED ORDER — DEXTROSE 50 % IV SOLN
12.50 g | INTRAVENOUS | Status: DC | PRN
Start: 2020-03-10 — End: 2020-03-20

## 2020-03-10 MED ORDER — ENOXAPARIN SODIUM 40 MG/0.4ML SC SOLN
40.00 mg | Freq: Every day | SUBCUTANEOUS | Status: DC
Start: 2020-03-10 — End: 2020-03-20
  Administered 2020-03-10 – 2020-03-20 (×10): 40 mg via SUBCUTANEOUS
  Filled 2020-03-10 (×11): qty 0.4

## 2020-03-10 MED ORDER — MELATONIN 3 MG PO TABS
3.0000 mg | ORAL_TABLET | Freq: Every evening | ORAL | Status: DC | PRN
Start: 2020-03-10 — End: 2020-03-20
  Administered 2020-03-11 – 2020-03-19 (×3): 3 mg via ORAL
  Filled 2020-03-10 (×3): qty 1

## 2020-03-10 MED ORDER — LISINOPRIL 20 MG PO TABS
40.0000 mg | ORAL_TABLET | Freq: Every day | ORAL | Status: DC
Start: 2020-03-10 — End: 2020-03-20
  Administered 2020-03-10 – 2020-03-20 (×11): 40 mg via ORAL
  Filled 2020-03-10 (×11): qty 2

## 2020-03-10 MED ORDER — ACETAMINOPHEN 650 MG RE SUPP
650.00 mg | Freq: Four times a day (QID) | RECTAL | Status: DC | PRN
Start: 2020-03-10 — End: 2020-03-20
  Filled 2020-03-10: qty 1

## 2020-03-10 MED ORDER — ONDANSETRON 4 MG PO TBDP
4.00 mg | ORAL_TABLET | Freq: Four times a day (QID) | ORAL | Status: DC | PRN
Start: 2020-03-10 — End: 2020-03-20

## 2020-03-10 MED ORDER — EZETIMIBE 10 MG PO TABS
10.0000 mg | ORAL_TABLET | Freq: Every day | ORAL | Status: DC
Start: 2020-03-10 — End: 2020-03-20
  Administered 2020-03-10 – 2020-03-20 (×11): 10 mg via ORAL
  Filled 2020-03-10 (×11): qty 1

## 2020-03-10 MED ORDER — METRONIDAZOLE IN NACL 500 MG/100 ML IV SOLN
500.00 mg | Freq: Three times a day (TID) | INTRAVENOUS | Status: DC
Start: 2020-03-10 — End: 2020-03-14
  Administered 2020-03-10 – 2020-03-14 (×12): 500 mg via INTRAVENOUS
  Filled 2020-03-10 (×15): qty 100

## 2020-03-10 MED ORDER — VANCOMYCIN 1000 MG IN 250 ML NS IVPB VIAL-MATE (CNR)
15.00 mg/kg | Freq: Once | INTRAVENOUS | Status: AC
Start: 2020-03-10 — End: 2020-03-10
  Administered 2020-03-10: 1000 mg via INTRAVENOUS
  Filled 2020-03-10: qty 250

## 2020-03-10 MED ORDER — PANTOPRAZOLE SODIUM 40 MG PO TBEC
40.00 mg | DELAYED_RELEASE_TABLET | Freq: Every morning | ORAL | Status: DC
Start: 2020-03-10 — End: 2020-03-20
  Administered 2020-03-10 – 2020-03-20 (×11): 40 mg via ORAL
  Filled 2020-03-10 (×11): qty 1

## 2020-03-10 MED ORDER — GLUCOSE 40 % PO GEL
15.00 g | ORAL | Status: DC | PRN
Start: 2020-03-10 — End: 2020-03-20

## 2020-03-10 MED ORDER — PIPERACILLIN SOD-TAZOBACTAM SO 4.5 (4-0.5) G IV SOLR
4.50 g | Freq: Once | INTRAVENOUS | Status: AC
Start: 2020-03-10 — End: 2020-03-10
  Administered 2020-03-10: 4.5 g via INTRAVENOUS
  Filled 2020-03-10: qty 20

## 2020-03-10 MED ORDER — ACETAMINOPHEN 500 MG PO TABS
1000.00 mg | ORAL_TABLET | Freq: Once | ORAL | Status: AC
Start: 2020-03-10 — End: 2020-03-10
  Administered 2020-03-10: 1000 mg via ORAL
  Filled 2020-03-10: qty 2

## 2020-03-10 MED ORDER — MORPHINE SULFATE 2 MG/ML IJ/IV SOLN (WRAP)
2.0000 mg | Freq: Once | Status: DC
Start: 2020-03-10 — End: 2020-03-12

## 2020-03-10 MED ORDER — ASPIRIN 81 MG PO CHEW
81.00 mg | CHEWABLE_TABLET | Freq: Every day | ORAL | Status: DC
Start: 2020-03-10 — End: 2020-03-20
  Administered 2020-03-10 – 2020-03-20 (×11): 81 mg via ORAL
  Filled 2020-03-10 (×11): qty 1

## 2020-03-10 MED ORDER — ACETAMINOPHEN 500 MG PO TABS
1000.0000 mg | ORAL_TABLET | Freq: Once | ORAL | Status: DC
Start: 2020-03-10 — End: 2020-03-10

## 2020-03-10 MED ORDER — LEVOFLOXACIN IN D5W 500 MG/100ML IV SOLN
500.00 mg | INTRAVENOUS | Status: DC
Start: 2020-03-10 — End: 2020-03-14
  Administered 2020-03-10 – 2020-03-13 (×4): 500 mg via INTRAVENOUS
  Filled 2020-03-10 (×5): qty 100

## 2020-03-10 MED ORDER — LEVOTHYROXINE SODIUM 50 MCG PO TABS
25.0000 ug | ORAL_TABLET | Freq: Every morning | ORAL | Status: DC
Start: 2020-03-10 — End: 2020-03-20
  Administered 2020-03-10 – 2020-03-20 (×11): 25 ug via ORAL
  Filled 2020-03-10 (×11): qty 1

## 2020-03-10 MED ORDER — ACETAMINOPHEN 325 MG PO TABS
650.0000 mg | ORAL_TABLET | Freq: Four times a day (QID) | ORAL | Status: DC | PRN
Start: 2020-03-10 — End: 2020-03-20
  Administered 2020-03-10 – 2020-03-18 (×3): 650 mg via ORAL
  Filled 2020-03-10 (×5): qty 2

## 2020-03-10 MED ORDER — ONDANSETRON HCL 4 MG/2ML IJ SOLN
4.00 mg | Freq: Four times a day (QID) | INTRAMUSCULAR | Status: DC | PRN
Start: 2020-03-10 — End: 2020-03-20

## 2020-03-10 NOTE — ED Provider Notes (Signed)
Discussed with Dr. Theadora Rama for admission

## 2020-03-10 NOTE — ED Notes (Signed)
Attempted twice to get covid flu sample but pt could not tolerate and pulled my hand out.

## 2020-03-10 NOTE — ED Notes (Signed)
Bed: E07  Expected date:   Expected time:   Means of arrival:   Comments:

## 2020-03-10 NOTE — H&P (Signed)
ADMISSION HISTORY AND PHYSICAL EXAM    [facility], DIVISION OF HOSPITALIST MEDICINE   [facility]   [place] Pager: [phone]      Date Time: 03/10/20 6:39 PM  Patient Name: Emily [name]  Attending Physician: Theresia Lo, MD  Primary Care Physician: Skeet Simmer, MD    CC: Abdominal pain      Assessment:     Active Hospital Problems    Diagnosis   . COVID-19 ruled out by laboratory testing       74 years old female with past medical history of breast cancer status post right breast mastectomy, history of hypertension, hyperlipidemia, hypothyroidism and chronic vertigo presented to hospital with acute onset abdominal pain secondary to acute colitis    Plan:   -Admit to [facility] Hospitalist service      Sepsis present on admission secondary to acute colitis, fever 102.2, WBC 20,000 with left shift  CT scan of the abdomen and pelvic without contrast showed wall thickening of the cecum and ascending colon compatible with underlying colitis most likely infectious or inflammatory  -We will keep the patient n.p.o. for bowel rest  -Patient received intravenous IV antibiotic in emergency room, start levofloxacin and metronidazole  -Check ESR and CRP  -Monitor WBC curve  -Monitor temperature  -For pain management I started her on tramadol for moderate pain as well as Tylenol for mild pain, patient has morphine listed at her allergy however she states that that was for long time ago she had dizziness, she would like to try 1 dose of morphine due to severe abdominal pain.  2 mg IV morphine ordered      Mild hyperglycemia recent hemoglobin A1c is less than 6    History of hypertension on lisinopril 40 mg    History of hyperlipidemia on Zetia    History of breast cancer status postmastectomy    Disposition:     Anticipated medical stability for discharge:  2 nights and more     History of Presenting Illness:   Emily [name] is a 74 y.o. female who presents to the hospital with with past medical history  of breast cancer status post right breast mastectomy, history of hypertension, hyperlipidemia, hypothyroidism and chronic vertigo presented to hospital with acute onset abdominal pain secondary to acute colitis , patient started yesterday after dinner, pain started gradually on the right side of the abdomen and was associated with some nausea, last bowel movement was yesterday and it was normal, she denies seeing any blood in her stool.  She did not have any vomiting but she felt very nauseous and she had very little appetite.  Her pain is an radiated to the right chest and and in epigastric region, pain is constant and progressive.  Patient denied having any similar symptom in the past.  She denies any left-sided chest pain shortness of breath, cough.    On arrival to emergency room,tem 102.2 , ,HR 72 , RR 20 , BP 145/65 , WBC 20.8 , HG 12 , HCT 38 , PLT 321 , BUN 10 , Cr 0.8 ,CT scan of the abdomen and pelvic without contrast showed wall thickening of the cecum and ascending colon compatible with underlying colitis most likely infectious or inflammatory .she started on IV  fluid and IV  antibiotics .           Past Medical History:     Past Medical History:   Diagnosis Date   . Breast cancer 2014  right breast mastectomy   . GERD (gastroesophageal reflux disease)    . Hyperlipidemia    . Hypertension    . Hypothyroidism    . Malignant neoplasm of overlapping sites of right female breast 10/19/2015   . Vertigo        Available old records reviewed, including: EPIC     Past Surgical History:     Past Surgical History:   Procedure Laterality Date   . HYSTERECTOMY     . MASTECTOMY Right 2015       Family History:     Family History   Problem Relation Age of Onset   . Breast cancer Neg Hx        Social History:     Social History     Tobacco Use   Smoking Status Never Smoker   Smokeless Tobacco Never Used     Social History     Substance and Sexual Activity   Alcohol Use Yes   . Alcohol/week: 1.0 standard drinks   .  Types: 1 Cans of beer per week    Comment: socially     Social History     Substance and Sexual Activity   Drug Use No       Allergies:     Allergies   Allergen Reactions   . Caffeine      Personal preference   . Morphine Itching   . Morphine And Related Itching and Nausea And Vomiting     dizzy       Medications:     No current facility-administered medications on file prior to encounter.     Current Outpatient Medications on File Prior to Encounter   Medication Sig Dispense Refill   . anastrozole (ARIMIDEX) 1 MG tablet Take 1 tablet (1 mg total) by mouth daily (Patient taking differently: Take 1 mg by mouth every morning    ) 90 tablet 3   . aspirin 81 MG chewable tablet Chew 1 tablet (81 mg total) by mouth daily 90 tablet 0   . aspirin 81 MG EC tablet Take 81 mg by mouth every morning         . ezetimibe (ZETIA) 10 MG tablet Take 1 tablet (10 mg total) by mouth daily 90 tablet 3   . ibuprofen (ADVIL) 600 MG tablet Take 1 tablet (600 mg total) by mouth every 6 (six) hours as needed for Pain 30 tablet 0   . levothyroxine (SYNTHROID) 25 MCG tablet Take 1 tablet (25 mcg total) by mouth every morning 30 tablet 0   . lisinopril (ZESTRIL) 20 MG tablet Take 1 tablet (20 mg total) by mouth 2 (two) times daily (Patient taking differently: Take 40 mg by mouth daily   ) 180 tablet 3   . meclizine (ANTIVERT) 25 MG tablet Take 1 tablet (25 mg total) by mouth 3 (three) times daily as needed for Dizziness 10 tablet 0   . Multiple Vitamins-Minerals (MULTIVITAMIN WITH MINERALS) tablet Take 1 tablet by mouth every morning         . pantoprazole (PROTONIX) 40 MG tablet Take 1 tablet (40 mg total) by mouth every morning before breakfast 30 tablet 0   . rosuvastatin (CRESTOR) 40 MG tablet Take 0.5 tablets (20 mg total) by mouth daily 30 tablet 0   . vitamin B-1 (THIAMINE) 100 MG tablet Take 100 mg by mouth every morning.         Review of Systems:   All other systems were reviewed and  are negative except:   Review of Systems -  Respiratory ROS: no cough, shortness of breath, or wheezing  Cardiovascular ROS: no chest pain or dyspnea on exertion  Gastrointestinal ROS: positive for - abdominal pain and appetite loss  Genito-Urinary ROS: no dysuria, trouble voiding, or hematuria    Physical Exam:     Patient Vitals for the past 24 hrs:   BP Temp Temp src Pulse Resp SpO2 Height   03/10/20 1807 129/69 99 F (37.2 C) Oral 68 19 94 % -   03/10/20 1712 147/64 99.4 F (37.4 C) Oral 67 20 97 % -   03/10/20 1551 133/61 99.3 F (37.4 C) Oral 66 20 99 % -   03/10/20 1404 123/59 99.3 F (37.4 C) Oral 67 - 99 % -   03/10/20 1146 145/65 (!) 102.2 F (39 C) Oral 72 20 97 % 1.6 m (5\' 3" )     Body mass index is 27.81 kg/m.  No intake or output data in the 24 hours ending 03/10/20 1839    General: awake; no acute distress.  HEENT: perrla, eomi, sclera anicteric  oropharynx clear without lesions, mucous membranes moist  Neck: supple, no lymphadenopathy, no JVD, no carotid bruits  Cardiovascular: Normal S1 and S2, no murmurs, rubs or gallops  Lungs: clear to auscultation bilaterally, without wheezing, rhonchi, or rales  Abdomen: soft, tenderness on right side , + distended; no palpable masses, no hepatosplenomegaly, normoactive bowel sounds, no rebound or guarding  Extremities: no clubbing, cyanosis, or edema  Neuro: alert, oriented x 3, cranial nerves grossly intact, strength 5/5 in upper and lower extremities, sensation intact,   Skin: no rashes or lesions noted        Labs:     Results     Procedure Component Value Units Date/Time    Urine culture [[id number]] Collected: 03/10/20 1252    Specimen: Urine, Clean Catch Updated: 03/10/20 1635    Blood Culture Aerobic/Anaerobic #1 [[id number]] Collected: 03/10/20 1213    Specimen: Arm from Blood, Venipuncture Updated: 03/10/20 1606    Narrative:      1 BLUE+1 PURPLE    Blood Culture Aerobic/Anaerobic #2 [[id number]] Collected: 03/10/20 1231    Specimen: Arm from Blood, Venipuncture Updated: 03/10/20 1606     Narrative:      1 BLUE+1 PURPLE    COVID-19 (SARS-COV-2) and Influenza Raina Mina Rapid) [[id number]] Collected: 03/10/20 1231    Specimen: Nasopharyngeal Updated: 03/10/20 1336     Purpose of COVID testing Diagnostic -PUI     SARS-CoV-2 Specimen Source Nasopharyngeal     SARS CoV 2 Overall Result Not Detected     Influenza A Not Detected     Influenza B Not Detected    Narrative:      Diagnostic -PUI    UA Reflex to Micro - Reflex to Culture [[id number]]  (Abnormal) Collected: 03/10/20 1252     Updated: 03/10/20 1319     Urine Type Urine, Clean Ca     Color, UA Yellow     Clarity, UA Cloudy     Specific Gravity UA 1.023     Urine pH 8.0     Leukocyte Esterase, UA Small     Nitrite, UA Negative     Protein, UR >=500     Glucose, UA Negative     Ketones UA Negative     Urobilinogen, UA Negative mg/dL      Bilirubin, UA Negative     Blood, UA Negative     RBC,  UA 3 - 5 /hpf      WBC, UA 6 - 10 /hpf      Squamous Epithelial Cells, Urine 0 - 5 /hpf     Comprehensive metabolic panel [[id number]]  (Abnormal) Collected: 03/10/20 1213    Specimen: Blood Updated: 03/10/20 1242     Glucose 126 mg/dL      BUN 10 mg/dL      Creatinine 0.8 mg/dL      Sodium 409 mEq/L      Potassium 4.1 mEq/L      Chloride 101 mEq/L      CO2 26 mEq/L      Calcium 8.9 mg/dL      Protein, Total 6.8 g/dL      Albumin 3.9 g/dL      AST (SGOT) 19 U/L      ALT 13 U/L      Alkaline Phosphatase 74 U/L      Bilirubin, Total 0.8 mg/dL      Globulin 2.9 g/dL      Albumin/Globulin Ratio 1.3     Anion Gap 12.0    GFR [[id number]] Collected: 03/10/20 1213     Updated: 03/10/20 1242     EGFR >60.0    Lactic Acid [[id number]] Collected: 03/10/20 1213    Specimen: Blood Updated: 03/10/20 1222     Lactic Acid 1.1 mmol/L     CBC and differential [[id number]]  (Abnormal) Collected: 03/10/20 1213    Specimen: Blood Updated: 03/10/20 1222     WBC 20.87 x10 3/uL      Hgb 12.1 g/dL      Hematocrit 52.8 %      Platelets 321 x10 3/uL      RBC 4.42 x10 6/uL      MCV 86.0 fL      MCH  27.4 pg      MCHC 31.8 g/dL      RDW 13 %      MPV 11.4 fL      Neutrophils 83.7 %      Lymphocytes Automated 9.7 %      Monocytes 6.0 %      Eosinophils Automated 0.0 %      Basophils Automated 0.1 %      Immature Granulocytes 0.5 %      Nucleated RBC 0.0 /100 WBC      Neutrophils Absolute 17.46 x10 3/uL      Lymphocytes Absolute Automated 2.02 x10 3/uL      Monocytes Absolute Automated 1.26 x10 3/uL      Eosinophils Absolute Automated 0.00 x10 3/uL      Basophils Absolute Automated 0.03 x10 3/uL      Immature Granulocytes Absolute 0.10 x10 3/uL      Absolute NRBC 0.00 x10 3/uL           Imaging personally reviewed, including:   CT Abd/Pelvis without Contrast    Result Date: 03/10/2020    1. Examination limited by lack of oral and IV contrast. 2. Wall thickening of the cecum and ascending colon compatible with underlying colitis, most likely infectious or inflammatory. Correlate clinically. 3. Additional chronic findings detailed above. Mitali Bapna, MD  03/10/2020 1:33 PM    US Abdomen Limited RUQ    Result Date: 03/10/2020  1. No evidence of cholelithiasis. Normal common duct. 2. Simple right renal cysts. No hydronephrosis. Kinnie Feil, MD  03/10/2020 3:54 PM              Signed by: Theresia Lo,  MD   GN:[id number], Dema Severin, MD

## 2020-03-10 NOTE — ED Notes (Signed)
[facility]  ED NURSING NOTE FOR THE RECEIVING INPATIENT NURSE   ED NURSE Frederic Jericho [phone]   ED CHARGE RN Dahlia Client   ADMISSION INFORMATION   HARLAN VINAL is a 74 y.o. female admitted with an ED diagnosis of:    1. COVID-19 ruled out by laboratory testing    2. Colitis         Isolation: None   Allergies: Caffeine, Morphine, and Morphine and related   Holding Orders confirmed? Yes   Belongings Documented? Yes   Home medications sent to pharmacy confirmed? No   NURSING CARE   Patient Comes From:   Mental Status: Home Independent  alert and oriented   ADL: Independent with all ADLs   Ambulation: mild difficulty   Pertinent Information  and Safety Concerns: IV abx for colitis     CT / NIH   CT Head ordered on this patient?  No   NIH/Dysphagia assessment done prior to admission? No   VITAL SIGNS (at the time of this note)      Vitals:    03/10/20 1712   BP: 147/64   Pulse: 67   Resp: 20   Temp: 99.4 F (37.4 C)   SpO2: 97%

## 2020-03-10 NOTE — ED Notes (Signed)
IV attempt 2x unsuccessful. 2nd set of Blood culture was collected

## 2020-03-10 NOTE — ED Provider Notes (Signed)
EMERGENCY DEPARTMENT NOTE     Patient initially seen and examined at   ED PHYSICIAN ASSIGNED     Date/Time Event User Comments    03/10/20 1158 Physician Assigned [place], Chanz Cahall D Meilin Brosh, Veverly Fells, MD assigned as Attending         ED MIDLEVEL (APP) ASSIGNED     None          HISTORY OF PRESENT ILLNESS   Historian:Patient  Translator Used: no    Chief Complaint: Abdominal Pain and Generalized weakness     74 y.o. female presents to the emergency department with approximately 12 hours of right sided abdominal pain. Patient was seen here yesterday for right-sided chest pain did not have abdominal pain at that time. Patient notes that she subsequently developed right sided abdominal pain. Patient denies nausea vomiting diarrhea. No dysuria hematuria.    1. Location of symptoms: Abdomen  2. Onset of symptoms: Approximate 12 hours prior arrival  3. What was patient doing when symptoms started (Context): see above  4. Severity: moderate  5. Timing: Constant  6. Activities that worsen symptoms: None  7. Activities that improve symptoms: None  8. Quality: Ache  9. Radiation of symptoms: no  10. Associated signs and Symptoms: see above  11. Are symptoms worsening? yes  MEDICAL HISTORY     Past Medical History:  Past Medical History:   Diagnosis Date   . Breast cancer 2014    right breast mastectomy   . GERD (gastroesophageal reflux disease)    . Hyperlipidemia    . Hypertension    . Hypothyroidism    . Malignant neoplasm of overlapping sites of right female breast 10/19/2015   . Vertigo        Past Surgical History:  Past Surgical History:   Procedure Laterality Date   . HYSTERECTOMY     . MASTECTOMY Right 2015       Social History:  Social History     Socioeconomic History   . Marital status: Widowed     Spouse name: Not on file   . Number of children: Not on file   . Years of education: Not on file   . Highest education level: Not on file   Occupational History   . Not on file   Tobacco Use   . Smoking status: Never  Smoker   . Smokeless tobacco: Never Used   Substance and Sexual Activity   . Alcohol use: Yes     Alcohol/week: 1.0 standard drinks     Types: 1 Cans of beer per week     Comment: socially   . Drug use: No   . Sexual activity: Not on file   Other Topics Concern   . Not on file   Social History Narrative   . Not on file     Social Determinants of Health     Financial Resource Strain:    . Difficulty of Paying Living Expenses:    Food Insecurity:    . Worried About Programme researcher, broadcasting/film/video in the Last Year:    . Barista in the Last Year:    Transportation Needs:    . Freight forwarder (Medical):    Marland Kitchen Lack of Transportation (Non-Medical):    Physical Activity:    . Days of Exercise per Week:    . Minutes of Exercise per Session:    Stress:    . Feeling of Stress :    Social Connections:    .  Frequency of Communication with Friends and Family:    . Frequency of Social Gatherings with Friends and Family:    . Attends Religious Services:    . Active Member of Clubs or Organizations:    . Attends Banker Meetings:    Marland Kitchen Marital Status:    Intimate Partner Violence:    . Fear of Current or Ex-Partner:    . Emotionally Abused:    Marland Kitchen Physically Abused:    . Sexually Abused:        Family History:  Family History   Problem Relation Age of Onset   . Breast cancer Neg Hx        Outpatient Medication:  Current Discharge Medication List      CONTINUE these medications which have NOT CHANGED    Details   aspirin 81 MG chewable tablet Chew 1 tablet (81 mg total) by mouth daily  Qty: 90 tablet, Refills: 0      aspirin 81 MG EC tablet Take 81 mg by mouth every morning          ezetimibe (ZETIA) 10 MG tablet Take 1 tablet (10 mg total) by mouth daily  Qty: 90 tablet, Refills: 3    Associated Diagnoses: Pure hypercholesterolemia      ibuprofen (ADVIL) 600 MG tablet Take 1 tablet (600 mg total) by mouth every 6 (six) hours as needed for Pain  Qty: 30 tablet, Refills: 0      levothyroxine (SYNTHROID) 25 MCG tablet Take  1 tablet (25 mcg total) by mouth every morning  Qty: 30 tablet, Refills: 0      meclizine (ANTIVERT) 25 MG tablet Take 1 tablet (25 mg total) by mouth 3 (three) times daily as needed for Dizziness  Qty: 10 tablet, Refills: 0      Multiple Vitamins-Minerals (MULTIVITAMIN WITH MINERALS) tablet Take 1 tablet by mouth every morning          pantoprazole (PROTONIX) 40 MG tablet Take 1 tablet (40 mg total) by mouth every morning before breakfast  Qty: 30 tablet, Refills: 0      rosuvastatin (CRESTOR) 40 MG tablet Take 0.5 tablets (20 mg total) by mouth daily  Qty: 30 tablet, Refills: 0    Associated Diagnoses: Essential hypertension; Mixed hyperlipidemia      vitamin B-1 (THIAMINE) 100 MG tablet Take 100 mg by mouth every morning.      anastrozole (ARIMIDEX) 1 MG tablet Take 1 tablet (1 mg total) by mouth daily  Qty: 90 tablet, Refills: 3    Associated Diagnoses: Malignant neoplasm of overlapping sites of right breast in female, estrogen receptor positive      lisinopril (ZESTRIL) 20 MG tablet Take 1 tablet (20 mg total) by mouth 2 (two) times daily  Qty: 180 tablet, Refills: 3    Associated Diagnoses: Essential hypertension; Dyspepsia               REVIEW OF SYSTEMS   Review of Systems   Constitutional: Positive for fatigue.   Respiratory: Negative for cough and shortness of breath.    Cardiovascular: Negative for leg swelling.   Gastrointestinal: Positive for abdominal pain. Negative for diarrhea, nausea and vomiting.   Musculoskeletal: Negative for back pain.   All other systems reviewed and are negative.      PHYSICAL EXAM     ED Triage Vitals [03/10/20 1146]   Enc Vitals Group      BP 145/65      Heart Rate 72  Resp Rate 20      Temp (!) 102.2 F (39 C)      Temp Source Oral      SpO2 97 %      Weight       Height 1.6 m      Head Circumference       Peak Flow       Pain Score 10      Pain Loc       Pain Edu?       Excl. in GC?    Nursing note and vitals reviewed.  Constitutional:  Well developed, well nourished.   Awake & alert.    Head:  Atraumatic.  Normocephalic.    ENT:  Mucous membranes are moist and intact.  Patent airway.  Neck:  Supple.  No JVD.   Cardiovascular:  Regular rate.  Regular rhythm.   Pulmonary/Chest:  No evidence of respiratory distress.  Clear to auscultation bilaterally.  No wheezing, rales or rhonchi.   Abdominal:  Soft and non-distended.  There is RUQ and RLQ tenderness.  No rebound, guarding, or rigidity.  No bruit. No masses palpable  Back:  No CVA tenderness.   Extremities:  No edema.   No cyanosis.  No clubbing.  2+ DP/PT/radial pulses b/l.  Skin:  Skin is warm and dry.  No diaphoresis.    Neurological:  Alert, awake, and appropriate.  Normal speech.  Moves all extremities.  Normal gate.  Psychiatric:  Good eye contact.  Normal interaction, affect, and behavior    MEDICAL DECISION MAKING     DISCUSSION    CT abdomen pelvis rule out abscess shows wall thickening of the ascending colon consistent with colitis. The gallbladder is not well seen. Right upper quadrant ultrasound rule out cholecystitis. Patient covered with Zosyn and Vanco pending culture results. Patient signed out to Dr. Odie Sera pending ultrasound results.         Vital Signs: Reviewed the patient's vital signs.   Nursing Notes: Reviewed and utilized available nursing notes.  Medical Records Reviewed: Reviewed available past medical records.  Counseling: The emergency provider has spoken with the patient and discussed today's findings, in addition to providing specific details for the plan of care.  Questions are answered and there is agreement with the plan.        CARDIAC STUDIES    The following cardiac studies were independently interpreted by the Emergency Medicine Physician.  For full cardiac study results please see chart.    Monitor Strip  Interpreted by ED Physician  Rate: 85  Rhythm: NSR   ST Changes: none    EKG Interpretation:  Signed and interpreted by ED Provider   Time Interpreted: 1151  Rate: 70  Rhythm: NSR  Axis:  normal  Intervals: normal  Blocks: none  ST segments: no acute changes  Interpretation: Nonspecific  EKG    RADIOLOGY IMAGING STUDIES      XR Abdomen 2 Views   Final Result    Findings compatible with nonspecific segmental colitis   although other etiologies of bowel wall thickening such as neoplasia   cannot be excluded.. No obstruction      Laurena Slimmer, MD    03/11/2020 11:19 AM      US Abdomen Limited RUQ   Final Result      1. No evidence of cholelithiasis. Normal common duct.   2. Simple right renal cysts. No hydronephrosis.      Kinnie Feil, MD    03/10/2020  3:54 PM      CT Abd/Pelvis without Contrast   Final Result        1. Examination limited by lack of oral and IV contrast.   2. Wall thickening of the cecum and ascending colon compatible with   underlying colitis, most likely infectious or inflammatory. Correlate   clinically.   3. Additional chronic findings detailed above.      Mitali Bapna, MD    03/10/2020 1:33 PM            PULSE OXIMETRY    Oxygen Saturation by Pulse Oximetry: 98%  Interventions: none  Interpretation:  normal    EMERGENCY DEPT. MEDICATIONS      ED Medication Orders (From admission, onward)    Start Ordered     Status Ordering Provider    03/10/20 1330 03/10/20 1329  piperacillin-tazobactam (ZOSYN) 4.5 g in sodium chloride 0.9 % 100 mL IVPB mini-bag plus  Once     Route: Intravenous  Ordered Dose: 4.5 g     Last MAR action: Stopped Janathan Bribiesca D    03/10/20 1330 03/10/20 1329  vancomycin (VANCOCIN) 1000 mg in NS 250 mL IVPB vial-mate  Once     Route: Intravenous  Ordered Dose: 15 mg/kg     Last MAR action: Stopped Naquan Garman D    03/10/20 1215 03/10/20 1214    Once     Route: Oral  Ordered Dose: 1,000 mg     Discontinued Doyne Micke D    03/10/20 1200 03/10/20 1159  acetaminophen (TYLENOL) tablet 1,000 mg  Once     Route: Oral  Ordered Dose: 1,000 mg     Last MAR action: Given Haziel Molner D          LABORATORY RESULTS    Ordered and independently interpreted AVAILABLE  laboratory tests. Please see results section in chart for full details.  Results for orders placed or performed during the hospital encounter of 03/10/20   COVID-19 (SARS-COV-2) and Influenza ([facility])    Specimen: Nasopharyngeal   Result Value Ref Range    Purpose of COVID testing Diagnostic -PUI     SARS-CoV-2 Specimen Source Nasopharyngeal     SARS CoV 2 Overall Result Not Detected     Influenza A Not Detected     Influenza B Not Detected    UA Reflex to Micro - Reflex to Culture   Result Value Ref Range    Urine Type Urine, Clean Ca     Color, UA Yellow Colorless - Yellow    Clarity, UA Cloudy (A) Clear - Hazy    Specific Gravity UA 1.023 1.001 - 1.035    Urine pH 8.0 5.0 - 8.0    Leukocyte Esterase, UA Small (A) Negative    Nitrite, UA Negative Negative    Protein, UR >=500 (A) Negative    Glucose, UA Negative Negative    Ketones UA Negative Negative    Urobilinogen, UA Negative 0.2 - 2.0 mg/dL    Bilirubin, UA Negative Negative    Blood, UA Negative Negative    RBC, UA 3 - 5 0 - 5 /hpf    WBC, UA 6 - 10 (A) 0 - 5 /hpf    Squamous Epithelial Cells, Urine 0 - 5 0 - 25 /hpf   CBC and differential   Result Value Ref Range    WBC 20.87 (H) 3.10 - 9.50 x10 3/uL    Hgb 12.1 11.4 - 14.8 g/dL    Hematocrit  38.0 34.7 - 43.7 %    Platelets 321 142 - 346 x10 3/uL    RBC 4.42 3.90 - 5.10 x10 6/uL    MCV 86.0 78.0 - 96.0 fL    MCH 27.4 25.1 - 33.5 pg    MCHC 31.8 31.5 - 35.8 g/dL    RDW 13 11 - 15 %    MPV 11.4 8.9 - 12.5 fL    Neutrophils 83.7 None %    Lymphocytes Automated 9.7 None %    Monocytes 6.0 None %    Eosinophils Automated 0.0 None %    Basophils Automated 0.1 None %    Immature Granulocytes 0.5 None %    Nucleated RBC 0.0 0.0 - 0.0 /100 WBC    Neutrophils Absolute 17.46 (H) 1.10 - 6.33 x10 3/uL    Lymphocytes Absolute Automated 2.02 0.42 - 3.22 x10 3/uL    Monocytes Absolute Automated 1.26 (H) 0.21 - 0.85 x10 3/uL    Eosinophils Absolute Automated 0.00 0.00 - 0.44 x10 3/uL    Basophils Absolute Automated  0.03 0.00 - 0.08 x10 3/uL    Immature Granulocytes Absolute 0.10 (H) 0.00 - 0.07 x10 3/uL    Absolute NRBC 0.00 0.00 - 0.00 x10 3/uL   Comprehensive metabolic panel   Result Value Ref Range    Glucose 126 (H) 70 - 100 mg/dL    BUN 10 7 - 19 mg/dL    Creatinine 0.8 0.6 - 1.0 mg/dL    Sodium 161 096 - 045 mEq/L    Potassium 4.1 3.5 - 5.1 mEq/L    Chloride 101 100 - 111 mEq/L    CO2 26 22 - 29 mEq/L    Calcium 8.9 7.9 - 10.2 mg/dL    Protein, Total 6.8 6.0 - 8.3 g/dL    Albumin 3.9 3.5 - 5.0 g/dL    AST (SGOT) 19 5 - 34 U/L    ALT 13 0 - 55 U/L    Alkaline Phosphatase 74 37 - 106 U/L    Bilirubin, Total 0.8 0.2 - 1.2 mg/dL    Globulin 2.9 2.0 - 3.6 g/dL    Albumin/Globulin Ratio 1.3 0.9 - 2.2    Anion Gap 12.0 5.0 - 15.0   GFR   Result Value Ref Range    EGFR >60.0    Lactic Acid   Result Value Ref Range    Lactic Acid 1.1 0.2 - 2.0 mmol/L   C Reactive Protein   Result Value Ref Range    C-Reactive Protein 24.4 (H) 0.0 - 0.5 mg/dL   Basic Metabolic Panel   Result Value Ref Range    Glucose 106 (H) 70 - 100 mg/dL    BUN 13 7 - 19 mg/dL    Creatinine 0.8 0.6 - 1.0 mg/dL    Calcium 8.2 7.9 - 40.9 mg/dL    Sodium 811 914 - 782 mEq/L    Potassium 3.9 3.5 - 5.1 mEq/L    Chloride 106 100 - 111 mEq/L    CO2 25 22 - 29 mEq/L    Anion Gap 9.0 5.0 - 15.0   CBC and differential   Result Value Ref Range    WBC 17.68 (H) 3.10 - 9.50 x10 3/uL    Hgb 11.1 (L) 11.4 - 14.8 g/dL    Hematocrit 95.6 21.3 - 43.7 %    Platelets 266 142 - 346 x10 3/uL    RBC 4.08 3.90 - 5.10 x10 6/uL    MCV 86.5 78.0 -  96.0 fL    MCH 27.2 25.1 - 33.5 pg    MCHC 31.4 (L) 31.5 - 35.8 g/dL    RDW 13 11 - 15 %    MPV 11.7 8.9 - 12.5 fL    Neutrophils 83.4 None %    Lymphocytes Automated 7.6 None %    Monocytes 7.9 None %    Eosinophils Automated 0.1 None %    Basophils Automated 0.2 None %    Immature Granulocytes 0.8 None %    Nucleated RBC 0.0 0.0 - 0.0 /100 WBC    Neutrophils Absolute 14.74 (H) 1.10 - 6.33 x10 3/uL    Lymphocytes Absolute Automated 1.35 0.42 -  3.22 x10 3/uL    Monocytes Absolute Automated 1.40 (H) 0.21 - 0.85 x10 3/uL    Eosinophils Absolute Automated 0.02 0.00 - 0.44 x10 3/uL    Basophils Absolute Automated 0.03 0.00 - 0.08 x10 3/uL    Immature Granulocytes Absolute 0.14 (H) 0.00 - 0.07 x10 3/uL    Absolute NRBC 0.00 0.00 - 0.00 x10 3/uL   GFR   Result Value Ref Range    EGFR >60.0        CRITICAL CARE/PROCEDURES    Procedures    DIAGNOSIS      Diagnosis:  Final diagnoses:   COVID-19 ruled out by laboratory testing   Colitis       Disposition:  ED Disposition     ED Disposition Condition Date/Time Comment    Admit  Tue Mar 10, 2020  5:03 PM Admitting Physician: Laretta Bolster [[id number]]   Service:: Medicine [[id number]]   Estimated Length of Stay: > or = to 2 midnights   Tentative Discharge Plan?: Home or Self Care [1]   Does patient need telemetry?: No            Prescriptions:  Current Discharge Medication List      CONTINUE these medications which have NOT CHANGED    Details   aspirin 81 MG chewable tablet Chew 1 tablet (81 mg total) by mouth daily  Qty: 90 tablet, Refills: 0      aspirin 81 MG EC tablet Take 81 mg by mouth every morning          ezetimibe (ZETIA) 10 MG tablet Take 1 tablet (10 mg total) by mouth daily  Qty: 90 tablet, Refills: 3    Associated Diagnoses: Pure hypercholesterolemia      ibuprofen (ADVIL) 600 MG tablet Take 1 tablet (600 mg total) by mouth every 6 (six) hours as needed for Pain  Qty: 30 tablet, Refills: 0      levothyroxine (SYNTHROID) 25 MCG tablet Take 1 tablet (25 mcg total) by mouth every morning  Qty: 30 tablet, Refills: 0      meclizine (ANTIVERT) 25 MG tablet Take 1 tablet (25 mg total) by mouth 3 (three) times daily as needed for Dizziness  Qty: 10 tablet, Refills: 0      Multiple Vitamins-Minerals (MULTIVITAMIN WITH MINERALS) tablet Take 1 tablet by mouth every morning          pantoprazole (PROTONIX) 40 MG tablet Take 1 tablet (40 mg total) by mouth every morning before breakfast  Qty: 30 tablet, Refills: 0       rosuvastatin (CRESTOR) 40 MG tablet Take 0.5 tablets (20 mg total) by mouth daily  Qty: 30 tablet, Refills: 0    Associated Diagnoses: Essential hypertension; Mixed hyperlipidemia      vitamin B-1 (THIAMINE) 100 MG tablet Take 100 mg  by mouth every morning.      anastrozole (ARIMIDEX) 1 MG tablet Take 1 tablet (1 mg total) by mouth daily  Qty: 90 tablet, Refills: 3    Associated Diagnoses: Malignant neoplasm of overlapping sites of right breast in female, estrogen receptor positive      lisinopril (ZESTRIL) 20 MG tablet Take 1 tablet (20 mg total) by mouth 2 (two) times daily  Qty: 180 tablet, Refills: 3    Associated Diagnoses: Essential hypertension; Dyspepsia             This note was generated by the Epic EMR system/ Dragon speech recognition and may contain inherent errors or omissions not intended by the user. Grammatical errors, random word insertions, deletions and pronoun errors  are occasional consequences of this technology due to software limitations. Not all errors are caught or corrected. If there are questions or concerns about the content of this note or information contained within the body of this dictation they should be addressed directly with the author for clarification.     Shela Nevin, MD  03/12/20 [phone]

## 2020-03-11 ENCOUNTER — Inpatient Hospital Stay: Payer: Medicare Other

## 2020-03-11 LAB — GLUCOSE WHOLE BLOOD - POCT: Whole Blood Glucose POCT: 117 mg/dL — ABNORMAL HIGH (ref 70–100)

## 2020-03-11 LAB — CBC AND DIFFERENTIAL
Absolute NRBC: 0 10*3/uL (ref 0.00–0.00)
Basophils Absolute Automated: 0.03 10*3/uL (ref 0.00–0.08)
Basophils Automated: 0.2 %
Eosinophils Absolute Automated: 0.02 10*3/uL (ref 0.00–0.44)
Eosinophils Automated: 0.1 %
Hematocrit: 35.3 % (ref 34.7–43.7)
Hgb: 11.1 g/dL — ABNORMAL LOW (ref 11.4–14.8)
Immature Granulocytes Absolute: 0.14 10*3/uL — ABNORMAL HIGH (ref 0.00–0.07)
Immature Granulocytes: 0.8 %
Lymphocytes Absolute Automated: 1.35 10*3/uL (ref 0.42–3.22)
Lymphocytes Automated: 7.6 %
MCH: 27.2 pg (ref 25.1–33.5)
MCHC: 31.4 g/dL — ABNORMAL LOW (ref 31.5–35.8)
MCV: 86.5 fL (ref 78.0–96.0)
MPV: 11.7 fL (ref 8.9–12.5)
Monocytes Absolute Automated: 1.4 10*3/uL — ABNORMAL HIGH (ref 0.21–0.85)
Monocytes: 7.9 %
Neutrophils Absolute: 14.74 10*3/uL — ABNORMAL HIGH (ref 1.10–6.33)
Neutrophils: 83.4 %
Nucleated RBC: 0 /100 WBC (ref 0.0–0.0)
Platelets: 266 10*3/uL (ref 142–346)
RBC: 4.08 10*6/uL (ref 3.90–5.10)
RDW: 13 % (ref 11–15)
WBC: 17.68 10*3/uL — ABNORMAL HIGH (ref 3.10–9.50)

## 2020-03-11 LAB — SEDIMENTATION RATE: Sed Rate: 21 mm/Hr — ABNORMAL HIGH (ref 0–20)

## 2020-03-11 LAB — BASIC METABOLIC PANEL
Anion Gap: 9 (ref 5.0–15.0)
BUN: 13 mg/dL (ref 7–19)
CO2: 25 mEq/L (ref 22–29)
Calcium: 8.2 mg/dL (ref 7.9–10.2)
Chloride: 106 mEq/L (ref 100–111)
Creatinine: 0.8 mg/dL (ref 0.6–1.0)
Glucose: 106 mg/dL — ABNORMAL HIGH (ref 70–100)
Potassium: 3.9 mEq/L (ref 3.5–5.1)
Sodium: 140 mEq/L (ref 136–145)

## 2020-03-11 LAB — C-REACTIVE PROTEIN: C-Reactive Protein: 24.4 mg/dL — ABNORMAL HIGH (ref 0.0–0.5)

## 2020-03-11 LAB — GFR: EGFR: >60

## 2020-03-11 NOTE — Plan of Care (Signed)
Pt is A&Ox4. VS stable. Pain well controlled by PRN Toradol Q6 given once this shift. Able to make needs known, and in no visible distress. Abdominal imaging completed., pt. returned to the unit with no discrepancies. Maintained NPO due to bowel rest, allowed H2O per MD. No nausea/vomiting noted. Right sided limb restriction band placed.   Safety precautions maintained: bed at the lowest position, call light within reach, bed alarm on and hourly rounding conducted.      Problem: Safety  Goal: Patient will be free from injury during hospitalization  Outcome: Progressing  Flowsheets (Taken 03/11/2020 1717)  Patient will be free from injury during hospitalization:  . Assess patient's risk for falls and implement fall prevention plan of care per policy  . Use appropriate transfer methods  . Ensure appropriate safety devices are available at the bedside  . Provide and maintain safe environment  . Hourly rounding  . Assess for patients risk for elopement and implement Elopement Risk Plan per policy  Goal: Patient will be free from infection during hospitalization  Outcome: Progressing  Flowsheets (Taken 03/11/2020 1717)  Free from Infection during hospitalization:  . Assess and monitor for signs and symptoms of infection  . Monitor all insertion sites (i.e. indwelling lines, tubes, urinary catheters, and drains)  . Monitor lab/diagnostic results  . Encourage patient and family to use good hand hygiene technique     Problem: Pain  Goal: Pain at adequate level as identified by patient  Outcome: Progressing  Flowsheets (Taken 03/11/2020 1717)  Pain at adequate level as identified by patient:  . Identify patient comfort function goal  . Assess for risk of opioid induced respiratory depression, including snoring/sleep apnea. Alert healthcare team of risk factors identified.  . Assess pain on admission, during daily assessment and/or before any "as needed" intervention(s)  . Reassess pain within 30-60 minutes of any  procedure/intervention, per Pain Assessment, Intervention, Reassessment (AIR) Cycle  . Evaluate patient's satisfaction with pain management progress  . Consult/collaborate with Physical Therapy, Occupational Therapy, and/or Speech Therapy  . Offer non-pharmacological pain management interventions  . Evaluate if patient comfort function goal is met     Problem: Infection  Goal: Free from infection  Outcome: Progressing  Flowsheets (Taken 03/11/2020 1717)  Free from infection:  . Assess for signs/symptoms of infection  . Consult/collaborate with Infection Preventionist  . Utilize isolation precautions per protocol/policy  . Utilize sepsis protocol  . Assess immunization status

## 2020-03-11 NOTE — Nursing Progress Note (Addendum)
Pt. Is off of the unit for imaging, escorted off of the unit via  transporter. Ticket to ride signed.         Pt. Returned to the unit at 11:53am.

## 2020-03-11 NOTE — UM Notes (Signed)
Emily [name] KitchenMallie Darting [name], Emily [name] @ [phone]. .    [birth year]  74 y.o. female who presents to the hospital with with past medical history of breast cancer status post right breast mastectomy, history of hypertension, hyperlipidemia, hypothyroidism and chronic vertigo presented to hospital with acute onset abdominal pain secondary to acute colitis , patient started yesterday after dinner, pain started gradually on the right side of the abdomen and was associated with some nausea, last bowel movement was yesterday and it was normal, she denies seeing any blood in her stool.  She did not have any vomiting but she felt very nauseous and she had very little appetite.  Her pain is an radiated to the right chest and and in epigastric region, pain is constant and progressive.  Patient denied having any similar symptom in the past.  She denies any left-sided chest pain shortness of breath, cough.        03/10/20 1146 145/65 (!) 102.2 F (39 C) Oral 72 20 97      EXAM    Cardiovascular: Normal S1 and S2, no murmurs, rubs or gallops  Lungs: clear to auscultation bilaterally, without wheezing, rhonchi, or rales  Abdomen: soft, tenderness on right side , + distended; no palpable masses, no hepatosplenomegaly, normoactive bowel sounds, no rebound or guarding    UA Reflex to Micro - Reflex to Culture [[id number]]  (Abnormal) Collected: 03/10/20 1252      Updated: 03/10/20 1319     Urine Type Urine, Clean Ca     Color, UA Yellow     Clarity, UA Cloudy     Specific Gravity UA 1.023     Urine pH 8.0     Leukocyte Esterase, UA Small     Nitrite, UA Negative     Protein, UR >=500     Glucose, UA Negative     Ketones UA Negative     Urobilinogen, UA Negative mg/dL      Bilirubin, UA Negative     Blood, UA Negative     RBC, UA 3 - 5 /hpf      WBC, UA 6 - 10 /hpf      Squamous Epithelial Cells, Urine 0 - 5 /hpf      CBC and differential [[id number]]  (Abnormal) Collected: 03/10/20 1213     Specimen: Blood Updated:  03/10/20 1222     WBC 20.87 x10       US Abdomen Limited RUQ (Final result  1. No evidence of cholelithiasis. Normal common duct.   2. Simple right renal cysts. No hydronephrosis.       CT Abd/Pelvis without Contrast (Final result      . Examination limited by lack of oral and IV contrast.   2. Wall thickening of the cecum and ascending colon compatible with   underlying colitis, most likely infectious or inflammatory. Correlate   clinically.   3. Additional chronic findings detailed above.           Admit to Inpatient (Order [id number])  Admission  Date: 03/10/2020 Department: 3B Oncology General Medicine Ordering/Authorizing: Genia Hotter, MD     DX: COLITIS    PLAN::  Sepsis present on admission secondary to acute colitis, fever 102.2, WBC 20,000 with left shift  CT scan of the abdomen and pelvic without contrast showed wall thickening of the cecum and ascending colon compatible with underlying colitis most likely infectious or inflammatory  -We will keep the patient n.p.o. for bowel rest  -Patient received intravenous IV  antibiotic in emergency room, start levofloxacin and metronidazole  -Check ESR and CRP  -Monitor WBC curve  -Monitor temperature  -For pain management I started her on tramadol for moderate pain as well as Tylenol for mild pain, patient has morphine listed at her allergy however she states that that was for long time ago she had dizziness, she would like to try 1 dose of morphine due to severe abdominal pain.  2 mg IV morphine ordered      Mild hyperglycemia recent hemoglobin A1c is less than 6

## 2020-03-11 NOTE — Progress Notes (Signed)
INTERNAL MEDICINE PROGRESS NOTE  [facility], Division of Hospitalist Medicine  [facility]  [place] pager: [phone]      Date Time: 03/11/20 6:58 PM  Patient Name: Emily [name]  Attending Physician: Theresia Lo, MD    Assessment:   Principal Problem:    COVID-19 ruled out by laboratory testing  Resolved Problems:    * No resolved hospital problems. *      74 years old female with past medical history of breast cancer status post right breast mastectomy, history of hypertension, hyperlipidemia, hypothyroidism and chronic vertigo presented to hospital with acute onset abdominal pain secondary to acute colitis    Today 1     Plan:     Sepsis present on admission secondary to acute colitis, fever 102.2, WBC 20,000 with left shift  CT scan of the abdomen and pelvic without contrast showed wall thickening of the cecum and ascending colon compatible with underlying colitis most likely infectious or inflammatory  -We will keep the patient n.p.o. for bowel rest  -Patient received intravenous IV antibiotic in emergency room, start levofloxacin and metronidazole  -Elevated ESR 21 and CRP 24.4  -CEA ordered  -Patient continue to have severe abdominal pain and on the right side of the abdomen patient is extremely tender to touch and pain exacerbated by movement, abdominal x-ray does not show any free air, show colitis will repeat CT scan of the abdomen and pelvis with p.o. contrast, surgery also consulted  -Monitor WBC curve  -Monitor temperature  -For pain management I started her on tramadol for moderate pain as well as Tylenol for mild pain, patient has morphine listed at her allergy however she states that that was for long time ago she had dizziness, she would like to try 1 dose of morphine due to severe abdominal pain.  2 mg IV morphine ordered-but was not given      Mild hyperglycemia recent hemoglobin A1c is less than 6    History of hypertension on lisinopril 40 mg    History of  hyperlipidemia on Zetia    History of breast cancer status postmastectomy      Deep vein thrombosis prophylaxis     Full Code  Diet NPO effective now Except for: SIPS WITH MEDS    Data Unavailable   @DISPOSITION @        Case discussed with: .nurse, surgery   Subjective/24 hour events:     Chief Complaint   Patient presents with   . Abdominal Pain   . Generalized weakness       Last 24 h : No fever  Today : . LOS: 1 day  Patient is doing better but he still have significant abdominal pain on the right side and tender to touch, patient said with each movement the pain gets worse        Medications:   anastrozole, 1 mg, Daily  aspirin, 81 mg, Daily  enoxaparin, 40 mg, Daily  ezetimibe, 10 mg, Daily  levoFLOXacin, 500 mg, Q24H  levothyroxine, 25 mcg, QAM  lisinopril, 40 mg, Daily  metroNIDAZOLE, 500 mg, Q8H  morphine, 2 mg, Once  pantoprazole, 40 mg, QAM AC  rosuvastatin, 20 mg, Daily  thiamine, 100 mg, QAM      Current Facility-Administered Medications   Medication Dose Route   . acetaminophen  650 mg Oral    Or   . acetaminophen  650 mg Rectal   . acetaminophen  650 mg Oral   . alum &  mag hydroxide-simethicone  15 mL Oral   . dextrose  15 g of glucose Oral    And   . dextrose  12.5 g Intravenous    And   . glucagon (rDNA)  1 mg Intramuscular   . ketorolac  30 mg Intravenous   . meclizine  25 mg Oral   . melatonin  3 mg Oral   . naloxone  0.2 mg Intravenous   . ondansetron  4 mg Oral    Or   . ondansetron  4 mg Intravenous     Physical exam:   Temp:  [97.9 F (36.6 C)-101.3 F (38.5 C)] 99.5 F (37.5 C)  Heart Rate:  [66-79] 66  Resp Rate:  [16-18] 16  BP: (103-137)/(56-72) 107/56    Intake/Output Summary (Last 24 hours) at 03/11/2020 1858  Last data filed at 03/11/2020 0645  Gross per 24 hour   Intake 1000 ml   Output --   Net 1000 ml     General: Awake, alert, oriented, no apparent distress.  Cardiovascular: Regular rate and rhythm. No murmurs, gallops or rubs noted.  Lungs: Clear to auscultation bilaterally. No  wheezing, crackles or rhonchi noted.  Abdomen: Soft, Right UQ-LQ tender, +distended. No organomegaly or masses noted. Normal bowel sounds. No guarding or rebound tenderness noted.  Extremities: No edema noted. 2+ pulses throughout.  Neuro: Non-focal neurological exam.        Labs (last 72 hours):     Recent Labs   Lab 03/11/20  0609 03/10/20  1213   WBC 17.68* 20.87*   Hgb 11.1* 12.1   Hematocrit 35.3 38.0   Platelets 266 321     Recent Labs   Lab 03/11/20  0609 03/10/20  1213 03/08/20  1623 03/08/20  1623   Sodium 140 139  More results in Results Review 142   Potassium 3.9 4.1  More results in Results Review 5.0   Chloride 106 101  More results in Results Review 104   CO2 25 26  More results in Results Review 28   BUN 13 10  More results in Results Review 12   Creatinine 0.8 0.8  More results in Results Review 0.8   Calcium 8.2 8.9  More results in Results Review 9.2   Albumin  --  3.9  --  4.0   Protein, Total  --  6.8  --  7.1   Bilirubin, Total  --  0.8  --  0.4   Alkaline Phosphatase  --  74  --  81   ALT  --  13  --  20   AST (SGOT)  --  19  --  34   Glucose 106* 126*  More results in Results Review 106*   More results in Results Review = values in this interval not displayed.           Radiology:     Radiology Results (24 Hour)     Procedure Component Value Units Date/Time    XR Abdomen 2 Views [[id number]] Collected: 03/11/20 1118    Order Status: Completed Updated: 03/11/20 1122    Narrative:      History: Abdominal pain    KUB demonstrates a moderate amount of air distributed through  nondistended colon and small bowel. There is no pneumatosis seen.  Scattered vascular calcifications are present. Scattered injection  granulomata are present. Bowel wall edema is suspected in the right  colon      Impression:  Findings compatible with nonspecific segmental colitis  although other etiologies of bowel wall thickening such as neoplasia  cannot be excluded.. No obstruction    Laurena Slimmer, MD   03/11/2020 11:19  AM          Signed by: Theresia Lo, MD  Date/time: 03/11/20 6:58 PM

## 2020-03-11 NOTE — Consults (Signed)
CONSULTATION REPORT  [facility] [phone]      Consultation Date Time: 03/11/20 5:41 PM  Date of Admission: 03/10/2020     Requesting Physician: Theresia Lo, MD  Consulting Physician: Lenice Llamas, MD  Consulting Service: General Surgery    Reason For Consultation:  Abdominal Pain      Impression:     Patient Active Problem List   Diagnosis   . Malignant neoplasm of overlapping sites of right female breast   . Hypertension   . Mixed hyperlipidemia   . Hypothyroid   . Right sided numbness   . Chest discomfort   . SOB (shortness of breath)   . Left sided numbness   . Left-sided weakness   . COVID-19 ruled out by laboratory testing     Colitis    Plan:     Colitis, likely infectious versus inflammatory, less likely malignancy    -No acute operative intervention at this time  -CEA ordered  -Continue antibiotics  -N.p.o., IV fluids  -Repeat CT abdomen pelvis with p.o. contrast  -Outpatient colonoscopy in 6 weeks      History of Present Illness:   Emily [name] is a 74 y.o. female who  has a past medical history of Breast cancer (2014), GERD (gastroesophageal reflux disease), Hyperlipidemia, Hypertension, Hypothyroidism, Malignant neoplasm of overlapping sites of right female breast (10/19/2015), and Vertigo..     This is a pleasant 74 year old female with a past medical history significant for breast cancer s/p R mastectomy, HTN, HLD, hypothyroidism, chronic vertigo who presented to the hospital 03/10/2020 complaining of a several day history of diffuse abdominal pain, one episode of nausea, and fevers to 102. Initial work-up included leukocytosis to 20, and CT imaging demonstrating wall thickening of the cecum. She has been started on Levaquin Flagyl. She relates her last colonoscopy was many years ago, indicated for screening purposes and reportedly uneventful.    Past Medical History:     Past Medical History:   Diagnosis Date   . Breast cancer 2014    right breast mastectomy   . GERD (gastroesophageal reflux disease)     . Hyperlipidemia    . Hypertension    . Hypothyroidism    . Malignant neoplasm of overlapping sites of right female breast 10/19/2015   . Vertigo        Past Surgical History:     Past Surgical History:   Procedure Laterality Date   . HYSTERECTOMY     . MASTECTOMY Right 2015       Family History:     Family History   Problem Relation Age of Onset   . Breast cancer Neg Hx        Social History:     Social History     Socioeconomic History   . Marital status: Widowed     Spouse name: Not on file   . Number of children: Not on file   . Years of education: Not on file   . Highest education level: Not on file   Occupational History   . Not on file   Tobacco Use   . Smoking status: Never Smoker   . Smokeless tobacco: Never Used   Substance and Sexual Activity   . Alcohol use: Yes     Alcohol/week: 1.0 standard drinks     Types: 1 Cans of beer per week     Comment: socially   . Drug use: No   . Sexual activity: Not on file   Other  Topics Concern   . Not on file   Social History Narrative   . Not on file     Social Determinants of Health     Financial Resource Strain:    . Difficulty of Paying Living Expenses:    Food Insecurity:    . Worried About Programme researcher, broadcasting/film/video in the Last Year:    . Barista in the Last Year:    Transportation Needs:    . Freight forwarder (Medical):    Marland Kitchen Lack of Transportation (Non-Medical):    Physical Activity:    . Days of Exercise per Week:    . Minutes of Exercise per Session:    Stress:    . Feeling of Stress :    Social Connections:    . Frequency of Communication with Friends and Family:    . Frequency of Social Gatherings with Friends and Family:    . Attends Religious Services:    . Active Member of Clubs or Organizations:    . Attends Banker Meetings:    Marland Kitchen Marital Status:    Intimate Partner Violence:    . Fear of Current or Ex-Partner:    . Emotionally Abused:    Marland Kitchen Physically Abused:    . Sexually Abused:        Allergies:     Allergies   Allergen Reactions   .  Morphine Itching   . Morphine And Related Itching and Nausea And Vomiting     dizzy   . Caffeine      Personal preference       Medications:     Prior to Admission medications    Medication Sig Start Date End Date Taking? Authorizing Provider   aspirin 81 MG chewable tablet Chew 1 tablet (81 mg total) by mouth daily 01/29/20  Yes Silis, Charlann Noss, MD   aspirin 81 MG EC tablet Take 81 mg by mouth every morning       Yes [provider]   ezetimibe (ZETIA) 10 MG tablet Take 1 tablet (10 mg total) by mouth daily 02/27/20  Yes Royann Shivers, MD   ibuprofen (ADVIL) 600 MG tablet Take 1 tablet (600 mg total) by mouth every 6 (six) hours as needed for Pain 08/08/19  Yes Sheldon Silvan, FNP   levothyroxine (SYNTHROID) 25 MCG tablet Take 1 tablet (25 mcg total) by mouth every morning 01/29/20  Yes Silis, Charlann Noss, MD   meclizine (ANTIVERT) 25 MG tablet Take 1 tablet (25 mg total) by mouth 3 (three) times daily as needed for Dizziness 08/08/19  Yes Sheldon Silvan, FNP   Multiple Vitamins-Minerals (MULTIVITAMIN WITH MINERALS) tablet Take 1 tablet by mouth every morning       Yes [provider]   pantoprazole (PROTONIX) 40 MG tablet Take 1 tablet (40 mg total) by mouth every morning before breakfast 01/29/20  Yes Silis, Charlann Noss, MD   rosuvastatin (CRESTOR) 40 MG tablet Take 0.5 tablets (20 mg total) by mouth daily 04/17/19  Yes Amoah, Gretta Began, MD   vitamin B-1 (THIAMINE) 100 MG tablet Take 100 mg by mouth every morning.   Yes [provider]   anastrozole (ARIMIDEX) 1 MG tablet Take 1 tablet (1 mg total) by mouth daily  Patient taking differently: Take 1 mg by mouth every morning     01/19/18   Clent Ridges, MD   lisinopril (ZESTRIL) 20 MG tablet Take 1 tablet (20 mg total)  by mouth 2 (two) times daily  Patient taking differently: Take 40 mg by mouth daily    04/17/19   Mosie Epstein, MD       Review of Systems:   As per the HPI.  The patient denies any additional changes to their otic,  opthalmologic, dermatologic, pulmonary, cardiac, gastrointestinal, genitourinary, musculoskeletal, hematologic, constitutional, or psychiatric systems.      Physical Exam:     Vitals:    03/11/20 1304   BP: 107/56   Pulse: 66   Resp: 16   Temp: 99.5 F (37.5 C)   SpO2: 98%       General appearance - alert, well appearing, and in no distress, oriented to person, place, and time and normal appearing weight  Mental status - alert, oriented to person, place, and time, normal mood, behavior, speech, dress, motor activity, and thought processes  Eyes - pupils equal and reactive, extraocular eye movements intact, sclera anicteric  Chest - no tachypnea, retractions or cyanosis  Heart - normal rate and regular rhythm  Abdomen - tenderness noted R abdomen, + rebound and guarding, no diffuse peritoneal signs  Neurological - alert, oriented, normal speech, no focal findings or movement disorder noted  Musculoskeletal - no muscular tenderness noted  Extremities - no pedal edema noted  Skin - normal coloration and turgor, no rashes, no suspicious skin lesions noted    Labs:     Results     Procedure Component Value Units Date/Time    Glucose Whole Blood - POCT [[id number]]  (Abnormal) Collected: 03/11/20 1632     Updated: 03/11/20 1634     Whole Blood Glucose POCT 117 mg/dL     CEA [[id number]] Collected: 03/11/20 0609    Specimen: Blood Updated: 03/11/20 1634    Blood Culture Aerobic/Anaerobic #1 [[id number]] Collected: 03/10/20 1213    Specimen: Arm from Blood, Venipuncture Updated: 03/11/20 1621    Narrative:      ORDER#: [id number]                                    ORDERED BY: Lovie Macadamia  SOURCE: Blood, Venipuncture arm                      COLLECTED:  03/10/20 12:13  ANTIBIOTICS AT COLL.:                                RECEIVED :  03/10/20 16:06  Culture Blood Aerobic and Anaerobic        PRELIM      03/11/20 16:21  03/11/20   No Growth after 1 day/s of incubation.      Blood Culture Aerobic/Anaerobic #2 [[id number]]  Collected: 03/10/20 1231    Specimen: Arm from Blood, Venipuncture Updated: 03/11/20 1621    Narrative:      ORDER#: [id number]                                    ORDERED BY: Lovie Macadamia  SOURCE: Blood, Venipuncture arm                      COLLECTED:  03/10/20 12:31  ANTIBIOTICS AT COLL.:  RECEIVED :  03/10/20 16:06  Culture Blood Aerobic and Anaerobic        PRELIM      03/11/20 16:21  03/11/20   No Growth after 1 day/s of incubation.      Sedimentation rate (ESR) [[id number]]  (Abnormal) Collected: 03/11/20 0609    Specimen: Blood Updated: 03/11/20 1356     Sed Rate 21 mm/Hr     Urine culture [[id number]] Collected: 03/10/20 1252    Specimen: Urine, Clean Catch Updated: 03/11/20 1205    Narrative:      ORDER#: [id number]                                    ORDERED BY: Lovie Macadamia  SOURCE: Urine, Clean Catch                           COLLECTED:  03/10/20 12:52  ANTIBIOTICS AT COLL.:                                RECEIVED :  03/10/20 16:35  Culture Urine                              FINAL       03/11/20 12:05  03/11/20   30,000 - 50,000 CFU/ML of multiple bacterial morphotypes present.             Possible contamination, appropriate recollection is             requested if clinically indicated.      C Reactive Protein [[id number]]  (Abnormal) Collected: 03/11/20 0609    Specimen: Blood Updated: 03/11/20 0658     C-Reactive Protein 24.4 mg/dL     Basic Metabolic Panel [[id number]]  (Abnormal) Collected: 03/11/20 0609    Specimen: Blood Updated: 03/11/20 0656     Glucose 106 mg/dL      BUN 13 mg/dL      Creatinine 0.8 mg/dL      Calcium 8.2 mg/dL      Sodium 102 mEq/L      Potassium 3.9 mEq/L      Chloride 106 mEq/L      CO2 25 mEq/L      Anion Gap 9.0    GFR [[id number]] Collected: 03/11/20 0609     Updated: 03/11/20 0656     EGFR >60.0    CBC and differential [[id number]]  (Abnormal) Collected: 03/11/20 0609    Specimen: Blood Updated: 03/11/20 0636     WBC 17.68 x10 3/uL      Hgb 11.1  g/dL      Hematocrit 38.7 %      Platelets 266 x10 3/uL      RBC 4.08 x10 6/uL      MCV 86.5 fL      MCH 27.2 pg      MCHC 31.4 g/dL      RDW 13 %      MPV 11.7 fL      Neutrophils 83.4 %      Lymphocytes Automated 7.6 %      Monocytes 7.9 %      Eosinophils Automated 0.1 %      Basophils Automated 0.2 %      Immature Granulocytes 0.8 %  Nucleated RBC 0.0 /100 WBC      Neutrophils Absolute 14.74 x10 3/uL      Lymphocytes Absolute Automated 1.35 x10 3/uL      Monocytes Absolute Automated 1.40 x10 3/uL      Eosinophils Absolute Automated 0.02 x10 3/uL      Basophils Absolute Automated 0.03 x10 3/uL      Immature Granulocytes Absolute 0.14 x10 3/uL      Absolute NRBC 0.00 x10 3/uL           Rads:     Radiology Results (24 Hour)     Procedure Component Value Units Date/Time    XR Abdomen 2 Views [[id number]] Collected: 03/11/20 1118    Order Status: Completed Updated: 03/11/20 1122    Narrative:      History: Abdominal pain    KUB demonstrates a moderate amount of air distributed through  nondistended colon and small bowel. There is no pneumatosis seen.  Scattered vascular calcifications are present. Scattered injection  granulomata are present. Bowel wall edema is suspected in the right  colon      Impression:       Findings compatible with nonspecific segmental colitis  although other etiologies of bowel wall thickening such as neoplasia  cannot be excluded.. No obstruction    Emily Slimmer, MD   03/11/2020 11:19 AM          Signed by: Annitta Jersey

## 2020-03-11 NOTE — Progress Notes (Signed)
03/11/2020. C/O right side chest pain, abdominal pain, febrile, 10/10 pain...sepsis. Hydration IV, pain control, IV ABX, NPO. Monitor labs.     DCP: pend hosp course    PCP NOTIFICATION  Patient/patient designee was asked if patient/patient designee wanted to have his/her PCP notified. Patient/patient designee stated yes. CM called PCP and notified the physician that the patient is now hospitalized.         Healthcare Decisions  Advance Directive: Patient does not have advance directive  Healthcare Agent Appointed: No    Prior to admission  Type of Residence: Private residence  Living Arrangements: Family members  Dressing: Independent  Grooming: Independent  Feeding: Independent  Bathing: Needs assistance  Toileting: Needs assistance    Discharge Planning  Support Systems: Family members  Patient expects to be discharged to:: home    Consults/Providers  PT Evaluation Needed: Yes (Comment)  OT Evalulation Needed: Yes (Comment)  SLP Evaluation Needed: No       Lisette Grinder  [facility] CASE MANAGEMENT

## 2020-03-11 NOTE — Plan of Care (Signed)
Patient admitted from ED to 3B  C/O abdominal pain to right upper quadrant, received Toradol IV twice on this shift with maximum effect. Alert and oriented x 4. Minimum assist with daily care. Denies nausea/vomiting. Nothing by mouth, receiving IV fluids via peripheral line for hydration with adequate urination. Last bowel movement per patient was 03/09/20 that was normal. Right breast is absent due to mastectomy, no needle sticks nor BP to be done to the right arm. Sating well on room air. Call light at reach while purposeful hourly rounding initiated for safety and needs.   Problem: Safety  Goal: Patient will be free from injury during hospitalization  Outcome: Progressing  Flowsheets (Taken 03/11/2020 0604)  Patient will be free from injury during hospitalization:   Assess patient's risk for falls and implement fall prevention plan of care per policy   Provide and maintain safe environment   Ensure appropriate safety devices are available at the bedside   Include patient/ family/ care giver in decisions related to safety   Assess for patients risk for elopement and implement Elopement Risk Plan per policy   Provide alternative method of communication if needed (communication boards, writing)   Use appropriate transfer methods   Hourly rounding  Note: Purposeful hourly rounding in pace for safety and needs     Problem: Safety  Goal: Patient will be free from infection during hospitalization  Outcome: Progressing  Flowsheets (Taken 03/11/2020 0604)  Free from Infection during hospitalization:   Assess and monitor for signs and symptoms of infection   Monitor lab/diagnostic results   Encourage patient and family to use good hand hygiene technique  Note: Patient received tylenol po for fever.     Problem: Pain  Goal: Pain at adequate level as identified by patient  Outcome: Progressing  Flowsheets (Taken 03/11/2020 0604)  Pain at adequate level as identified by patient:   Identify patient comfort function goal   Assess for  risk of opioid induced respiratory depression, including snoring/sleep apnea. Alert healthcare team of risk factors identified.   Assess pain on admission, during daily assessment and/or before any "as needed" intervention(s)   Reassess pain within 30-60 minutes of any procedure/intervention, per Pain Assessment, Intervention, Reassessment (AIR) Cycle   Evaluate if patient comfort function goal is met   Evaluate patient's satisfaction with pain management progress   Include patient/patient care companion in decisions related to pain management as needed  Note: Patient C/O severe abdominal pain, had Tramadol iv with maximum effect     Problem: Side Effects from Pain Analgesia  Goal: Patient will experience minimal side effects of analgesic therapy  Outcome: Progressing  Flowsheets (Taken 03/11/2020 0604)  Patient will experience minimal side effects of analgesic therapy:   Monitor/assess patient's respiratory status (RR depth, effort, breath sounds)   Assess for changes in cognitive function   Prevent/manage side effects per LIP orders (i.e. nausea, vomiting, pruritus, constipation, urinary retention, etc.)  Note: Patient alert and oriented x 4. Respirations are within normal . Denies nausea/vomiting     Problem: Moderate/High Fall Risk Score >5  Goal: Patient will remain free of falls  Outcome: Progressing  Flowsheets (Taken 03/11/2020 0604)  Moderate Risk (6-13):   LOW-Fall Interventions Appropriate for Low Fall Risk   LOW-Anticoagulation education for injury risk  Note: Patient is a minimum assist with daily care     Problem: Inadequate Cardiac Output  Goal: Adequate tissue perfusion will be maintained  Outcome: Progressing  Flowsheets (Taken 03/11/2020 0604)  Adequate tissue perfusion  will be maintained:   Monitor/assess vital signs   Monitor/assess lab values and report abnormal values   Monitor intake and output   Monitor for signs and symptoms of a pulmonary embolism (dyspnea, tachypnea, tachycardia, confusion)    Assess and monitor skin integrity  Note: Patient's lactic acid level is within normal value at this time.  Receiving IVF via peripheral line for hydration with adequate urination. Denies nausea/vomiting. Nothing by mouth at this time.      Problem: Compromised skin integrity  Goal: Skin integrity is maintained or improved  Outcome: Progressing  Flowsheets (Taken 03/11/2020 0604)  Skin integrity is maintained or improved:   Increase activity as tolerated/progressive mobility   Relieve pressure to bony prominences   Keep skin clean and dry   Monitor patient's hygiene practices   Encourage use of lotion/moisturizer on skin   Keep head of bed 30 degrees or less (unless contraindicated)  Note: Patient has right breast mastectomy. No iv lines nor BP cuff to right arm.

## 2020-03-12 ENCOUNTER — Inpatient Hospital Stay: Payer: Medicare Other

## 2020-03-12 DIAGNOSIS — R1084 Generalized abdominal pain: Secondary | ICD-10-CM

## 2020-03-12 LAB — CBC
Absolute NRBC: 0 10*3/uL (ref 0.00–0.00)
Hematocrit: 33.4 % — ABNORMAL LOW (ref 34.7–43.7)
Hgb: 10.3 g/dL — ABNORMAL LOW (ref 11.4–14.8)
MCH: 26.7 pg (ref 25.1–33.5)
MCHC: 30.8 g/dL — ABNORMAL LOW (ref 31.5–35.8)
MCV: 86.5 fL (ref 78.0–96.0)
MPV: 11.6 fL (ref 8.9–12.5)
Nucleated RBC: 0 /100 WBC (ref 0.0–0.0)
Platelets: 206 10*3/uL (ref 142–346)
RBC: 3.86 10*6/uL — ABNORMAL LOW (ref 3.90–5.10)
RDW: 13 % (ref 11–15)
WBC: 14.88 10*3/uL — ABNORMAL HIGH (ref 3.10–9.50)

## 2020-03-12 MED ORDER — IOHEXOL 12 MG/ML PO SOLN
1000.00 mL | Freq: Once | ORAL | Status: AC | PRN
Start: 2020-03-12 — End: 2020-03-12
  Administered 2020-03-12: 1000 mL via ORAL

## 2020-03-12 MED ORDER — MORPHINE SULFATE 4 MG/ML IJ/IV SOLN (WRAP)
3.0000 mg | Status: DC | PRN
Start: 2020-03-12 — End: 2020-03-12

## 2020-03-12 MED ORDER — HYDROCODONE-ACETAMINOPHEN 10-325 MG PO TABS
1.0000 | ORAL_TABLET | Freq: Four times a day (QID) | ORAL | Status: DC | PRN
Start: 2020-03-12 — End: 2020-03-20
  Administered 2020-03-12 – 2020-03-16 (×4): 1 via ORAL
  Filled 2020-03-12 (×4): qty 1

## 2020-03-12 MED ORDER — HYDROCODONE-ACETAMINOPHEN 5-325 MG PO TABS
1.0000 | ORAL_TABLET | Freq: Four times a day (QID) | ORAL | Status: DC | PRN
Start: 2020-03-12 — End: 2020-03-20

## 2020-03-12 MED ORDER — IOHEXOL 350 MG/ML IV SOLN
100.00 mL | Freq: Once | INTRAVENOUS | Status: AC | PRN
Start: 2020-03-12 — End: 2020-03-12
  Administered 2020-03-12: 100 mL via INTRAVENOUS

## 2020-03-12 NOTE — Progress Notes (Signed)
INTERNAL MEDICINE PROGRESS NOTE  [facility], Division of Hospitalist Medicine  [facility]  [place] pager: [phone]      Date Time: 03/12/20 11:35 AM  Patient Name: Emily [name]  Attending Physician: Theresia Lo, MD    Assessment:   Principal Problem:    COVID-19 ruled out by laboratory testing  Resolved Problems:    * No resolved hospital problems. *      74 years old female with past medical history of breast cancer status post right breast mastectomy, history of hypertension, hyperlipidemia, hypothyroidism and chronic vertigo presented to hospital with acute onset abdominal pain secondary to acute colitis    Today 2     Plan:     Sepsis present on admission secondary to acute colitis, fever 102.2, WBC 20,000 with left shift  CT scan of the abdomen and pelvic without contrast showed wall thickening of the cecum and ascending colon compatible with underlying colitis most likely infectious or inflammatory  -We will keep the patient n.p.o. for bowel rest  -Patient received intravenous IV antibiotic in emergency room, start levofloxacin and metronidazole  -Elevated ESR 21 and CRP 24.4  -CEA ordered  -Patient continue to have severe abdominal pain and on the right side of the abdomen patient is extremely tender to touch and pain exacerbated by movement, abdominal x-ray does not show any free air, show colitis will repeat CT scan of the abdomen and pelvis with p.o. contrast, surgery also consulted  -Monitor WBC curve  -Monitor temperature  -For pain management I started her on tramadol for moderate pain as well as Tylenol for mild pain, patient has morphine listed at her allergy however she states that that was for long time ago she had dizziness, she would like to try 1 dose of morphine due to severe abdominal pain.  2 mg IV morphine ordered-but was not given  - will contact GI       Mild hyperglycemia recent hemoglobin A1c is less than 6    History of hypertension on lisinopril 40  mg    History of hyperlipidemia on Zetia    History of breast cancer status postmastectomy      Deep vein thrombosis prophylaxis Lovenox.     Full Code  Diet NPO effective now Except for: SIPS WITH MEDS    Data Unavailable   @DISPOSITION @        Case discussed with: .nurse, surgery - GI  Subjective/24 hour events:     Chief Complaint   Patient presents with   . Abdominal Pain   . Generalized weakness       Last 24 h : spiked fever   Today : . LOS: 2 days  Patient is in pain , pain still 7/10 , denied any nausea or vomiting , no diarrhea . No BM     Medications:   anastrozole, 1 mg, Daily  aspirin, 81 mg, Daily  enoxaparin, 40 mg, Daily  ezetimibe, 10 mg, Daily  levoFLOXacin, 500 mg, Q24H  levothyroxine, 25 mcg, QAM  lisinopril, 40 mg, Daily  metroNIDAZOLE, 500 mg, Q8H  morphine, 2 mg, Once  pantoprazole, 40 mg, QAM AC  rosuvastatin, 20 mg, Daily  thiamine, 100 mg, QAM      Current Facility-Administered Medications   Medication Dose Route   . acetaminophen  650 mg Oral    Or   . acetaminophen  650 mg Rectal   . acetaminophen  650 mg Oral   . alum & mag hydroxide-simethicone  15 mL Oral   . dextrose  15 g of glucose Oral    And   . dextrose  12.5 g Intravenous    And   . glucagon (rDNA)  1 mg Intramuscular   . ketorolac  30 mg Intravenous   . meclizine  25 mg Oral   . melatonin  3 mg Oral   . naloxone  0.2 mg Intravenous   . ondansetron  4 mg Oral    Or   . ondansetron  4 mg Intravenous     Physical exam:   Temp:  [99.1 F (37.3 C)-100.9 F (38.3 C)] 99.1 F (37.3 C)  Heart Rate:  [66-77] 71  Resp Rate:  [16-20] 18  BP: (107-137)/(56-74) 127/74  No intake or output data in the 24 hours ending 03/12/20 1135  General: Awake, alert, oriented, no apparent distress.  Cardiovascular: Regular rate and rhythm. No murmurs, gallops or rubs noted.  Lungs: Clear to auscultation bilaterally. No wheezing, crackles or rhonchi noted.  Abdomen: Soft, Right UQ-LQ tender, +distended. No organomegaly or masses noted. Normal bowel  sounds. No guarding or rebound tenderness noted.  Extremities: No edema noted. 2+ pulses throughout.  Neuro: Non-focal neurological exam.        Labs (last 72 hours):     Recent Labs   Lab 03/11/20  0609 03/10/20  1213   WBC 17.68* 20.87*   Hgb 11.1* 12.1   Hematocrit 35.3 38.0   Platelets 266 321     Recent Labs   Lab 03/11/20  0609 03/10/20  1213 03/08/20  1623 03/08/20  1623   Sodium 140 139  More results in Results Review 142   Potassium 3.9 4.1  More results in Results Review 5.0   Chloride 106 101  More results in Results Review 104   CO2 25 26  More results in Results Review 28   BUN 13 10  More results in Results Review 12   Creatinine 0.8 0.8  More results in Results Review 0.8   Calcium 8.2 8.9  More results in Results Review 9.2   Albumin  --  3.9  --  4.0   Protein, Total  --  6.8  --  7.1   Bilirubin, Total  --  0.8  --  0.4   Alkaline Phosphatase  --  74  --  81   ALT  --  13  --  20   AST (SGOT)  --  19  --  34   Glucose 106* 126*  More results in Results Review 106*   More results in Results Review = values in this interval not displayed.           Radiology:     Radiology Results (24 Hour)     ** No results found for the last 24 hours. **          Signed by: Theresia Lo, MD  Date/time: 03/12/20 11:35 AM

## 2020-03-12 NOTE — Progress Notes (Signed)
PROGRESS NOTE    Date Time: 03/12/20 4:20 AM  Patient Name: Emily [name], Emily [name]  Surgical Attending: Madie Reno., MD    Subjective:      I feel better but still have abdominal pain.    Objective:     Vitals:    Temp (24hrs), Avg:99.4 F (37.4 C), Min:97.9 F (36.6 C), Max:100.9 F (38.3 C)      Temp: Temp: (!) 100.9 F (38.3 C)   HR: Heart Rate: 77   BP: BP: 137/74   RR: Resp Rate: 20   SpO2 SpO2: 98 %   POCT Glucose: POCT Glucose Result (Read Only): (!) 117     Physical Exam:     General: awake, alert, oriented x 3; no acute distress.  Neck: supple, no lymphadenopathy, no thyromegaly  Cardiovascular: regular rate and rhythm, no murmurs, rubs or gallops  Lungs: clear to auscultation bilaterally, without wheezing, rhonchi, or rales  Abdomen: soft, generalized tenderness, non-distended; no palpable masses, no hepatosplenomegaly, normoactive bowel sounds, no rebound or guarding;   Skin: no rashes or lesions noted      Results     Procedure Component Value Units Date/Time    CEA [[id number]] Collected: 03/11/20 0609    Specimen: Blood Updated: 03/11/20 2121    Glucose Whole Blood - POCT [[id number]]  (Abnormal) Collected: 03/11/20 1632     Updated: 03/11/20 1634     Whole Blood Glucose POCT 117 mg/dL     Blood Culture Aerobic/Anaerobic #1 [[id number]] Collected: 03/10/20 1213    Specimen: Arm from Blood, Venipuncture Updated: 03/11/20 1621    Narrative:      ORDER#: [id number]                                    ORDERED BY: Lovie Macadamia  SOURCE: Blood, Venipuncture arm                      COLLECTED:  03/10/20 12:13  ANTIBIOTICS AT COLL.:                                RECEIVED :  03/10/20 16:06  Culture Blood Aerobic and Anaerobic        PRELIM      03/11/20 16:21  03/11/20   No Growth after 1 day/s of incubation.      Blood Culture Aerobic/Anaerobic #2 [[id number]] Collected: 03/10/20 1231    Specimen: Arm from Blood, Venipuncture Updated: 03/11/20 1621    Narrative:      ORDER#: [id number]                                     ORDERED BY: Lovie Macadamia  SOURCE: Blood, Venipuncture arm                      COLLECTED:  03/10/20 12:31  ANTIBIOTICS AT COLL.:                                RECEIVED :  03/10/20 16:06  Culture Blood Aerobic and Anaerobic        PRELIM      03/11/20 16:21  03/11/20   No Growth after 1 day/s of incubation.  Sedimentation rate (ESR) [[id number]]  (Abnormal) Collected: 03/11/20 0609    Specimen: Blood Updated: 03/11/20 1356     Sed Rate 21 mm/Hr     Urine culture [[id number]] Collected: 03/10/20 1252    Specimen: Urine, Clean Catch Updated: 03/11/20 1205    Narrative:      ORDER#: [id number]                                    ORDERED BY: Lovie Macadamia  SOURCE: Urine, Clean Catch                           COLLECTED:  03/10/20 12:52  ANTIBIOTICS AT COLL.:                                RECEIVED :  03/10/20 16:35  Culture Urine                              FINAL       03/11/20 12:05  03/11/20   30,000 - 50,000 CFU/ML of multiple bacterial morphotypes present.             Possible contamination, appropriate recollection is             requested if clinically indicated.            Radiology Results (24 Hour)     Procedure Component Value Units Date/Time    XR Abdomen 2 Views [[id number]] Collected: 03/11/20 1118    Order Status: Completed Updated: 03/11/20 1122    Narrative:      History: Abdominal pain    KUB demonstrates a moderate amount of air distributed through  nondistended colon and small bowel. There is no pneumatosis seen.  Scattered vascular calcifications are present. Scattered injection  granulomata are present. Bowel wall edema is suspected in the right  colon      Impression:       Findings compatible with nonspecific segmental colitis  although other etiologies of bowel wall thickening such as neoplasia  cannot be excluded.. No obstruction    Laurena Slimmer, MD   03/11/2020 11:19 AM          Assessment/Plan:     Hospital Day: 2     Principal Problem:    COVID-19 ruled out by laboratory  testing     Colitis      Abdominal pain      I recommend NPO/ IV ABXs for now.  No immediate surgery for now.  Consider GI consult for colitis in case IBD and not infectious.  We will follow.    Madie Reno., MD  03/12/2020 4:20 AM

## 2020-03-12 NOTE — Consults (Signed)
[address]  [place], [place].  [address]  [phone]  Fax:[phone]    GASTROENTEROLOGY INITIAL CONSULTATION NOTE    Date Time: 03/12/20 6:30 PM  Patient Name: Emily [name]  Requesting Physician: Theresia Lo, MD       Reason for Consultation:   Colitis    Assessment and Recommendations:   # Acute right side colitis.  The clinical picture is most suggestive of an acute infectious colitis.  CT abdomen pelvis was not suggestive of ischemic colitis. To rule out IBD, neoplasia such as colon cancer and lymphoma.       Recommendations:  -NPO for now, IV fluids, empiric IV antibiotics and supportive care as you are doing.  -Follow up blood culture  -Timing for endoscopic evaluation is to be determined pending clinical course.   -If patient improves with antibiotic and supportive care, recommend follow-up with outpatient gastroenterology to plan for outpatient colonoscopy 6 to 8 weeks  after resolution of acute colitis to exclude the misdiagnosis of a colonic neoplasm or IBD.  -If patient does not improve, will consider inpatient colonoscopy for diagnosis and to guide therapy.   -If pt develops diarrhea, send stool tests for infection:  --C. difficile toxin B PCR  --Stool culture  --Ova and parasites  -Fecal calprotectin    History:   Emily [name] is a 74 y.o. female who presents to the hospital on 03/10/2020 with 1 day history of acute onset abdominal pain and fever.  She was found with sepsis in the ER.  Lab tests notable for leukocytosis, significantly elevated ESR and CRP.  CT abdomen pelvis showed wall thickening of the cecum and ascending colon compatible with colitis.  She was kept n.p.o., received IV fluids and IV antibiotics. Today the abdominal pain slightly improved but was still fairly severe.  Denied nausea, vomiting, diarrhea.  She has not had a bowel movements since admission.  Last colonoscopy more than 10 years ago was reportedly normal.      Past Medical History:     Past  Medical History:   Diagnosis Date   . Breast cancer 2014    right breast mastectomy   . GERD (gastroesophageal reflux disease)    . Hyperlipidemia    . Hypertension    . Hypothyroidism    . Malignant neoplasm of overlapping sites of right female breast 10/19/2015   . Vertigo        Past Surgical History:     Past Surgical History:   Procedure Laterality Date   . HYSTERECTOMY     . MASTECTOMY Right 2015       Family History:     Family History   Problem Relation Age of Onset   . Breast cancer Neg Hx        Social History:     Social History     Socioeconomic History   . Marital status: Widowed     Spouse name: Not on file   . Number of children: Not on file   . Years of education: Not on file   . Highest education level: Not on file   Occupational History   . Not on file   Tobacco Use   . Smoking status: Never Smoker   . Smokeless tobacco: Never Used   Vaping Use   . Vaping Use: Never used   Substance and Sexual Activity   . Alcohol use: Yes     Alcohol/week: 1.0 standard drinks     Types: 1  Cans of beer per week     Comment: socially   . Drug use: No   . Sexual activity: Not on file   Other Topics Concern   . Not on file   Social History Narrative   . Not on file     Social Determinants of Health     Financial Resource Strain:    . Difficulty of Paying Living Expenses:    Food Insecurity:    . Worried About Programme researcher, broadcasting/film/video in the Last Year:    . Barista in the Last Year:    Transportation Needs:    . Freight forwarder (Medical):    Marland Kitchen Lack of Transportation (Non-Medical):    Physical Activity:    . Days of Exercise per Week:    . Minutes of Exercise per Session:    Stress:    . Feeling of Stress :    Social Connections:    . Frequency of Communication with Friends and Family:    . Frequency of Social Gatherings with Friends and Family:    . Attends Religious Services:    . Active Member of Clubs or Organizations:    . Attends Banker Meetings:    Marland Kitchen Marital Status:    Intimate Partner  Violence:    . Fear of Current or Ex-Partner:    . Emotionally Abused:    Marland Kitchen Physically Abused:    . Sexually Abused:        Allergies:     Allergies   Allergen Reactions   . Morphine Itching   . Morphine And Related Itching and Nausea And Vomiting     dizzy   . Caffeine      Personal preference       Medications:     Current Facility-Administered Medications   Medication Dose Route Frequency   . anastrozole  1 mg Oral Daily   . aspirin  81 mg Oral Daily   . enoxaparin  40 mg Subcutaneous Daily   . ezetimibe  10 mg Oral Daily   . levoFLOXacin  500 mg Intravenous Q24H   . levothyroxine  25 mcg Oral QAM   . lisinopril  40 mg Oral Daily   . metroNIDAZOLE  500 mg Intravenous Q8H   . pantoprazole  40 mg Oral QAM AC   . rosuvastatin  20 mg Oral Daily   . thiamine  100 mg Oral QAM       has a current medication list which includes the following prescription(s): aspirin - Chew 1 tablet (81 mg total) by mouth daily, aspirin - Take 81 mg by mouth every morning    , ezetimibe - Take 1 tablet (10 mg total) by mouth daily, ibuprofen - Take 1 tablet (600 mg total) by mouth every 6 (six) hours as needed for Pain, levothyroxine - Take 1 tablet (25 mcg total) by mouth every morning, meclizine - Take 1 tablet (25 mg total) by mouth 3 (three) times daily as needed for Dizziness, multivitamin with minerals - Take 1 tablet by mouth every morning    , pantoprazole - Take 1 tablet (40 mg total) by mouth every morning before breakfast, rosuvastatin - Take 0.5 tablets (20 mg total) by mouth daily, vitamin b-1 - Take 100 mg by mouth every morning, anastrozole - Take 1 tablet (1 mg total) by mouth daily (Patient taking differently: Take 1 mg by mouth every morning    ), and lisinopril - Take 1  tablet (20 mg total) by mouth 2 (two) times daily (Patient taking differently: Take 40 mg by mouth daily   ), and the following Facility-Administered Medications: acetaminophen **OR** acetaminophen, acetaminophen, alum & mag hydroxide-simethicone,  anastrozole, aspirin, Nursing communication: Adult Hypoglycemia Treatment Algorithm **AND** dextrose **AND** dextrose **AND** glucagon (rdna), enoxaparin, ezetimibe, hydrocodone-acetaminophen, hydrocodone-acetaminophen, ketorolac, levofloxacin, levothyroxine, lisinopril, meclizine, melatonin, metronidazole, naloxone, ondansetron **OR** ondansetron, pantoprazole, and rosuvastatinthiamine.     Review of Systems:   Constitutional: Denies fever, chills or weight loss.  Eyes: Denies changes in vision.  ENMT: Denies changes in hearing, nasal discharge, oral lesions or sore throat.  Respiratory: Denies wheezing or cough.  Cardiovascular: Denies chest pain or palpitations.  Gastrointestinal: See HPI.  Genitourinary: Denies gross hematuria or dark urine.  Musculoskeletal: Denies back pain or joint pain.  Neurologic: Denies slurred speech, hemiplegia or focal deficit.  Psychiatric: Denies depression or anxiety.   Hematologic: Denies easy bruising.  Integumentary:  Denies skin rashes or Jaundice.    Pertinent positives noted in HPI.    Physical Exam:     Vitals:    03/12/20 1333   BP: 148/72   Pulse: 66   Resp: 17   Temp: 98.6 F (37 C)   SpO2: 99%       General appearance - Well developed and well nourished, no acute distress.  Eyes - Sclera anicteric, no ptosis.  ENMT - Mucous membranes moist, nose and ears appear normal.  Oropharynx clear.  Respiratory - Non labored respirations, no audible wheezing.  Cardiovascular - Regular rate and rhythm, no JVD, no LE edema.  Gastrointestinal - Soft, mild to moderate tenderness on palpation in RLQ, no rebound or guarding, non-distended, no masses, normal bowel sounds.   Musculoskeletal - Normal range of motion of arms and legs.  Neurologic - Alert and oriented to person, place and time.  No gross movement disorders noted.  Psychiatric: Appropriate affect.  Skin: Normal color and turgor, no rashes, no suspicious skin lesions noted.      Labs Reviewed:     Recent Labs   Lab 03/12/20   1308 03/11/20  0609 03/10/20  1213   WBC 14.88* 17.68* 20.87*   Hgb 10.3* 11.1* 12.1   Hematocrit 33.4* 35.3 38.0   Platelets 206 266 321       Recent Labs   Lab 03/11/20  0609 03/10/20  1213 03/08/20  1623   Sodium 140 139 142   Potassium 3.9 4.1 5.0   Chloride 106 101 104   CO2 25 26 28    BUN 13 10 12    Creatinine 0.8 0.8 0.8   Calcium 8.2 8.9 9.2   Albumin  --  3.9 4.0   Protein, Total  --  6.8 7.1   Bilirubin, Total  --  0.8 0.4   Alkaline Phosphatase  --  74 81   ALT  --  13 20   AST (SGOT)  --  19 34   Glucose 106* 126* 106*               Rads:   GI Radiological Procedures since admission  reviewed.   Radiology Results (24 Hour)     Procedure Component Value Units Date/Time    CT Abdomen Pelvis W IV And PO Cont [[id number]] Collected: 03/12/20 1527    Order Status: Completed Updated: 03/12/20 1538    Narrative:      CLINICAL INDICATION:  Colitis    TECHNIQUE:  Helical CT images were obtained from the lung bases through  the pubic symphysis after the uneventful administration of 100 cc of  nonionic intravenous contrast with oral contrast. A combination of  automated exposure control, adjustment of the mA and/or kV according to  patient size and/or use of iterative reconstruction technique was  utilized.    COMPARISON:  03/10/2020    INTERPRETATION:      Right mastectomy. Small left pleural effusion. Thick right pleural  calcification with calcific granulomas at the right lung base. Streaky  density at the right lung base is unchanged. Cardiomegaly. Small  hypodense lesion in the left hepatic lobe is too small to accurately  characterize. Spleen and adrenals unremarkable. Bladder wall thickening  is new. Bilateral renal cysts and subcentimeter hypodense lesions in  both kidneys that are too small to characterize but probably cysts.  Bladder unremarkable. Uterus absent. Wall thickening of the cecum and  proximal ascending colon. Nonvisualization of the appendix. No  inflammatory stranding around the cecal base.  Trace fluid inferior to  the right hepatic lobe. No pelvic free fluid. No lymphadenopathy. Bony  degenerative changes. Bilateral calcified gluteal injection granulomas.      Impression:          1. Wall thickening of the cecum and ascending colon compatible with  colitis. Correlate clinically for infectious or inflammatory etiologies.  2. Small left pleural effusion is new.  3. Chronic findings detailed above.             Filbert Schilder, MD   03/12/2020 3:36 PM              Signed by: Einar Grad, MD

## 2020-03-12 NOTE — Nursing Progress Note (Signed)
Pt off of the unit for CT scan w/contrast. Ticket to ride signed.     Pt returned to the unit via transport at 1515.

## 2020-03-12 NOTE — Plan of Care (Signed)
Pt is A&Ox3-4. Disoriented to situation, MD re-educated. Pain well controlled with PRN Toradol and Hydrocodone. VS Stable, able to make needs known, and in no visible distress. CT with contrast completed this shift. Pt. Tolerated oral contrast well. No nausea/vomiting noted this shift. Maintained NPO due to bowel rest. Pt. safety ambulated X1 assist to restroom and back in bed. Safety precautions in place:bed at the lowest position, call light within reach, bed alarm on, and hourly rounding conducted.      Problem: Safety  Goal: Patient will be free from injury during hospitalization  Outcome: Progressing  Flowsheets (Taken 03/12/2020 1807)  Patient will be free from injury during hospitalization:  . Ensure appropriate safety devices are available at the bedside  . Assess patient's risk for falls and implement fall prevention plan of care per policy  . Assess for patients risk for elopement and implement Elopement Risk Plan per policy  . Include patient/ family/ care giver in decisions related to safety  . Provide and maintain safe environment  . Use appropriate transfer methods  . Hourly rounding  Goal: Patient will be free from infection during hospitalization  Outcome: Progressing  Flowsheets (Taken 03/12/2020 1807)  Free from Infection during hospitalization:  . Assess and monitor for signs and symptoms of infection  . Monitor all insertion sites (i.e. indwelling lines, tubes, urinary catheters, and drains)  . Monitor lab/diagnostic results  . Encourage patient and family to use good hand hygiene technique     Problem: Pain  Goal: Pain at adequate level as identified by patient  Outcome: Progressing  Flowsheets (Taken 03/12/2020 1807)  Pain at adequate level as identified by patient:  . Identify patient comfort function goal  . Assess for risk of opioid induced respiratory depression, including snoring/sleep apnea. Alert healthcare team of risk factors identified.  . Assess pain on admission, during daily  assessment and/or before any "as needed" intervention(s)  . Reassess pain within 30-60 minutes of any procedure/intervention, per Pain Assessment, Intervention, Reassessment (AIR) Cycle  . Evaluate if patient comfort function goal is met  . Offer non-pharmacological pain management interventions  . Consult/collaborate with Physical Therapy, Occupational Therapy, and/or Speech Therapy  . Include patient/patient care companion in decisions related to pain management as needed  . Evaluate patient's satisfaction with pain management progress     Problem: Inadequate Cardiac Output  Goal: Adequate tissue perfusion will be maintained  Outcome: Progressing  Flowsheets (Taken 03/12/2020 1807)  Adequate tissue perfusion will be maintained:  . Monitor/assess vital signs  . Monitor/assess lab values and report abnormal values  . Monitor/assess neurovascular status (pulses, capillary refill, pain, paresthesia, paralysis, presence of edema)  . Monitor/assess for signs of VTE (edema of calf/thigh redness, pain)  . Monitor for signs and symptoms of a pulmonary embolism (dyspnea, tachypnea, tachycardia, confusion)  . VTE Prevention: Administer anticoagulant(s) and/or apply anti-embolism stockings/devices as ordered  . Encourage/assist patient as needed to turn, cough, and perform deep breathing every 2 hours  . Reinforce use of ordered respiratory interventions (i.e. CPAP, BiPAP, Incentive Spirometer, Acapella, etc.)  . Increase mobility as tolerated/progressive mobility  . Reinforce ankle pump exercises  . Position patient for maximum circulation/cardiac output  . Perform active/passive ROM  . Elevate feet  . Place shoes or other foot protection on patient  . Assess and monitor skin integrity

## 2020-03-12 NOTE — Plan of Care (Signed)
Problem: Safety  Goal: Patient will be free from injury during hospitalization  Outcome: Progressing  Flowsheets (Taken 03/12/2020 0010)  Patient will be free from injury during hospitalization:   Assess patient's risk for falls and implement fall prevention plan of care per policy   Provide and maintain safe environment   Ensure appropriate safety devices are available at the bedside   Include patient/ family/ care giver in decisions related to safety   Assess for patients risk for elopement and implement Elopement Risk Plan per policy   Provide alternative method of communication if needed (communication boards, writing)   Use appropriate transfer methods   Hourly rounding  Note: Purposeful hourly rounding in place for safety and needs and per hospital policy.      Problem: Safety  Goal: Patient will be free from infection during hospitalization  Outcome: Progressing  Flowsheets (Taken 03/12/2020 0010)  Free from Infection during hospitalization:   Assess and monitor for signs and symptoms of infection   Encourage patient and family to use good hand hygiene technique   Monitor lab/diagnostic results  Note: Patient is afebrile at this time     Problem: Pain  Goal: Pain at adequate level as identified by patient  Outcome: Progressing  Flowsheets (Taken 03/12/2020 0010)  Pain at adequate level as identified by patient:   Identify patient comfort function goal   Assess for risk of opioid induced respiratory depression, including snoring/sleep apnea. Alert healthcare team of risk factors identified.   Assess pain on admission, during daily assessment and/or before any "as needed" intervention(s)   Reassess pain within 30-60 minutes of any procedure/intervention, per Pain Assessment, Intervention, Reassessment (AIR) Cycle   Evaluate patient's satisfaction with pain management progress   Evaluate if patient comfort function goal is met   Include patient/patient care companion in decisions related to pain management as  needed  Note: Patient's abdominal pain managed with Toradol ivp     Problem: Moderate/High Fall Risk Score >5  Goal: Patient will remain free of falls  Outcome: Progressing  Flowsheets (Taken 03/12/2020 0010)  Moderate Risk (6-13): MOD-Remain with patient during toileting  VH Moderate Risk (6-13): Remain with patient during toileting  Note: Patient is moderate assist with walking to the bathroom. Has adequate urination     Problem: Infection  Goal: Free from infection  Outcome: Progressing  Flowsheets (Taken 03/12/2020 0010)  Free from infection: Assess for signs/symptoms of infection  Note: Patient is on iv antibiotics  with no adverse reaction     Problem: Nutrition  Goal: Nutritional intake is adequate  Outcome: Progressing  Flowsheets (Taken 03/12/2020 0010)  Nutritional intake is adequate:   Encourage/perform oral hygiene as appropriate   Consult/collaborate with Clinical Nutritionist   Include patient/patient care companion in decisions related to nutrition

## 2020-03-13 LAB — CBC
Absolute NRBC: 0 10*3/uL (ref 0.00–0.00)
Hematocrit: 36.3 % (ref 34.7–43.7)
Hgb: 11.1 g/dL — ABNORMAL LOW (ref 11.4–14.8)
MCH: 27 pg (ref 25.1–33.5)
MCHC: 30.6 g/dL — ABNORMAL LOW (ref 31.5–35.8)
MCV: 88.3 fL (ref 78.0–96.0)
MPV: 11.5 fL (ref 8.9–12.5)
Nucleated RBC: 0 /100 WBC (ref 0.0–0.0)
Platelets: 290 10*3/uL (ref 142–346)
RBC: 4.11 10*6/uL (ref 3.90–5.10)
RDW: 13 % (ref 11–15)
WBC: 12.28 10*3/uL — ABNORMAL HIGH (ref 3.10–9.50)

## 2020-03-13 LAB — VITAMIN B1, PLASMA: Vitamin B1 (Thiamine): 98 nmol/L — ABNORMAL HIGH (ref 8–30)

## 2020-03-13 LAB — C-REACTIVE PROTEIN: C-Reactive Protein: 23.4 mg/dL — ABNORMAL HIGH (ref 0.0–0.5)

## 2020-03-13 NOTE — Progress Notes (Signed)
PROGRESS NOTE    Date Time: 03/13/20 4:07 AM  Patient Name: Emily [name]  Surgical Attending: Madie Reno., MD    Subjective:      I feel much better but I still have some discomfort.    Objective:     Vitals:    Temp (24hrs), Avg:99 F (37.2 C), Min:98.6 F (37 C), Max:99.7 F (37.6 C)      Temp: Temp: 99 F (37.2 C)   HR: Heart Rate: 65   BP: BP: 122/58   RR: Resp Rate: 18   SpO2 SpO2: 98 %   POCT Glucose: POCT Glucose Result (Read Only): (!) 117     Physical Exam:     General: awake, alert, oriented x 3; no acute distress.  Neck: supple, no lymphadenopathy, no thyromegaly  Cardiovascular: regular rate and rhythm, no murmurs, rubs or gallops  Lungs: clear to auscultation bilaterally, without wheezing, rhonchi, or rales  Abdomen: soft, mild to moderately tender, non-distended; no palpable masses, no hepatosplenomegaly, normoactive bowel sounds, no rebound or guarding;   Skin: no rashes or lesions noted      Results     Procedure Component Value Units Date/Time    Blood Culture Aerobic/Anaerobic #1 [[id number]] Collected: 03/10/20 1213    Specimen: Arm from Blood, Venipuncture Updated: 03/12/20 1621    Narrative:      ORDER#: [id number]                                    ORDERED BY: Lovie Macadamia  SOURCE: Blood, Venipuncture arm                      COLLECTED:  03/10/20 12:13  ANTIBIOTICS AT COLL.:                                RECEIVED :  03/10/20 16:06  Culture Blood Aerobic and Anaerobic        PRELIM      03/12/20 16:21  03/11/20   No Growth after 1 day/s of incubation.  03/12/20   No Growth after 2 day/s of incubation.      Blood Culture Aerobic/Anaerobic #2 [[id number]] Collected: 03/10/20 1231    Specimen: Arm from Blood, Venipuncture Updated: 03/12/20 1621    Narrative:      ORDER#: [id number]                                    ORDERED BY: Lovie Macadamia  SOURCE: Blood, Venipuncture arm                      COLLECTED:  03/10/20 12:31  ANTIBIOTICS AT COLL.:                                 RECEIVED :  03/10/20 16:06  Culture Blood Aerobic and Anaerobic        PRELIM      03/12/20 16:21  03/11/20   No Growth after 1 day/s of incubation.  03/12/20   No Growth after 2 day/s of incubation.      CBC without differential [[id number]]  (Abnormal) Collected: 03/12/20 1308    Specimen: Blood Updated: 03/12/20 1320  WBC 14.88 x10 3/uL      Hgb 10.3 g/dL      Hematocrit 21.3 %      Platelets 206 x10 3/uL      RBC 3.86 x10 6/uL      MCV 86.5 fL      MCH 26.7 pg      MCHC 30.8 g/dL      RDW 13 %      MPV 11.6 fL      Nucleated RBC 0.0 /100 WBC      Absolute NRBC 0.00 x10 3/uL           Radiology Results (24 Hour)     Procedure Component Value Units Date/Time    CT Abdomen Pelvis W IV And PO Cont [[id number]] Collected: 03/12/20 1527    Order Status: Completed Updated: 03/12/20 1538    Narrative:      CLINICAL INDICATION:  Colitis    TECHNIQUE:  Helical CT images were obtained from the lung bases through  the pubic symphysis after the uneventful administration of 100 cc of  nonionic intravenous contrast with oral contrast. A combination of  automated exposure control, adjustment of the mA and/or kV according to  patient size and/or use of iterative reconstruction technique was  utilized.    COMPARISON:  03/10/2020    INTERPRETATION:      Right mastectomy. Small left pleural effusion. Thick right pleural  calcification with calcific granulomas at the right lung base. Streaky  density at the right lung base is unchanged. Cardiomegaly. Small  hypodense lesion in the left hepatic lobe is too small to accurately  characterize. Spleen and adrenals unremarkable. Bladder wall thickening  is new. Bilateral renal cysts and subcentimeter hypodense lesions in  both kidneys that are too small to characterize but probably cysts.  Bladder unremarkable. Uterus absent. Wall thickening of the cecum and  proximal ascending colon. Nonvisualization of the appendix. No  inflammatory stranding around the cecal base. Trace fluid inferior  to  the right hepatic lobe. No pelvic free fluid. No lymphadenopathy. Bony  degenerative changes. Bilateral calcified gluteal injection granulomas.      Impression:          1. Wall thickening of the cecum and ascending colon compatible with  colitis. Correlate clinically for infectious or inflammatory etiologies.  2. Small left pleural effusion is new.  3. Chronic findings detailed above.             Mitali Bapna, MD   03/12/2020 3:36 PM          Assessment/Plan:     Hospital Day: 3     Principal Problem:    COVID-19 ruled out by laboratory testing      Appreciate GI input.  I agree with their recommendations.  No surgery planned at this time.    Madie Reno., MD FACS  03/13/2020 4:07 AM

## 2020-03-13 NOTE — Progress Notes (Signed)
Pt C/O pain, Meds given and was helpful. Started on full  liquid diet and tolerated well . Bloody BM, MD made aware. She is standby assist to the bathroom. IV antibiotic given as scheduled

## 2020-03-13 NOTE — Progress Notes (Signed)
Patient alert and oriented x 4. Minimum assist to the bathroom. No blood draws to right arm due to right breast mastectomy. Patient received iv flagyl via peripheral line with no adverse reaction. Patient given melatonin  Po for insomnia, slept in between in no respiratory distress. Patient also requested for norco this morning for left lower abdominal pain with maximum effect. Call light within reach. Patient had bowel movement, once. Still nothing by mouth. No nausea/vomiting.

## 2020-03-13 NOTE — Progress Notes (Signed)
INTERNAL MEDICINE PROGRESS NOTE  [facility], Division of Hospitalist Medicine  [facility]  [place] pager: [phone]      Date Time: 03/13/20 5:36 PM  Patient Name: Emily [name]  Attending Physician: Theresia Lo, MD    Assessment:   Principal Problem:    COVID-19 ruled out by laboratory testing  Resolved Problems:    * No resolved hospital problems. *      74 years old female with past medical history of breast cancer status post right breast mastectomy, history of hypertension, hyperlipidemia, hypothyroidism and chronic vertigo presented to hospital with acute onset abdominal pain secondary to acute colitis    Today 3     Plan:     Sepsis present on admission secondary to acute colitis, fever 102.2, WBC 20,000 with left shift  CT scan of the abdomen and pelvic without contrast showed wall thickening of the cecum and ascending colon compatible with underlying colitis most likely infectious or inflammatory  -Patient has been n.p.o. today we will start full liquid diet  -Patient received intravenous IV antibiotic in emergency room, start levofloxacin and metronidazole  -Elevated ESR 21 and CRP 24.4  -CEA ordered  -Patient continue to have severe abdominal pain and on the right side of the abdomen patient is extremely tender to touch and pain exacerbated by movement, abdominal x-ray does not show any free air, show colitis l repeat CT scan of the abdomen and pelvis with p.o. contrast : Showed acute colitis, surgery also consulted , no further recommendation  -Monitor WBC curve  -Monitor temperature  -For pain management I started her on tramadol for moderate pain as well as Tylenol for mild pain, patient has morphine listed at her allergy however she states that that was for long time ago she had dizziness, she would like to try 1 dose of morphine due to severe abdominal pain.  2 mg IV morphine ordered-but was not given  -Appreciate GI consult and recommendation will continue treating acute  colitis and refer for outpatient colonoscopy      Mild hyperglycemia recent hemoglobin A1c is less than 6    History of hypertension on lisinopril 40 mg    History of hyperlipidemia on Zetia    History of breast cancer status postmastectomy      Deep vein thrombosis prophylaxis Lovenox.     Full Code  Diet full liquid    Data Unavailable   @DISPOSITION @        Case discussed with: .nurse, surgery - GI  Subjective/24 hour events:     Chief Complaint   Patient presents with   . Abdominal Pain   . Generalized weakness       Last 24 h no fever , had some stool mixed with small amount of bright red blood  Today : . LOS: 3 days  Patient states that her pain significantly improved, she does not have fever or chills denies any nausea or vomiting and she feels hungry for the first time after few days.  She tolerated full liquid diet  Medications:   anastrozole, 1 mg, Daily  aspirin, 81 mg, Daily  enoxaparin, 40 mg, Daily  ezetimibe, 10 mg, Daily  levoFLOXacin, 500 mg, Q24H  levothyroxine, 25 mcg, QAM  lisinopril, 40 mg, Daily  metroNIDAZOLE, 500 mg, Q8H  pantoprazole, 40 mg, QAM AC  rosuvastatin, 20 mg, Daily  thiamine, 100 mg, QAM      Current Facility-Administered Medications   Medication Dose Route   .  acetaminophen  650 mg Oral    Or   . acetaminophen  650 mg Rectal   . acetaminophen  650 mg Oral   . alum & mag hydroxide-simethicone  15 mL Oral   . dextrose  15 g of glucose Oral    And   . dextrose  12.5 g Intravenous    And   . glucagon (rDNA)  1 mg Intramuscular   . HYDROcodone-acetaminophen  1 tablet Oral   . HYDROcodone-acetaminophen  1 tablet Oral   . ketorolac  30 mg Intravenous   . meclizine  25 mg Oral   . melatonin  3 mg Oral   . naloxone  0.2 mg Intravenous   . ondansetron  4 mg Oral    Or   . ondansetron  4 mg Intravenous     Physical exam:   Temp:  [98.2 F (36.8 C)-99.1 F (37.3 C)] 98.2 F (36.8 C)  Heart Rate:  [60-69] 60  Resp Rate:  [16-19] 19  BP: (106-146)/(52-72) 106/52  No intake or output  data in the 24 hours ending 03/13/20 1736  General: Awake, alert, oriented, no apparent distress.  Cardiovascular: Regular rate and rhythm. No murmurs, gallops or rubs noted.  Lungs: Clear to auscultation bilaterally. No wheezing, crackles or rhonchi noted.  Abdomen: Soft, Right UQ-LQ tender, +distended. No organomegaly or masses noted. Normal bowel sounds. No guarding or rebound tenderness noted.  Extremities: No edema noted. 2+ pulses throughout.  Neuro: Non-focal neurological exam.        Labs (last 72 hours):     Recent Labs   Lab 03/13/20  1454 03/12/20  1308   WBC 12.28* 14.88*   Hgb 11.1* 10.3*   Hematocrit 36.3 33.4*   Platelets 290 206     Recent Labs   Lab 03/11/20  0609 03/10/20  1213 03/08/20  1623 03/08/20  1623   Sodium 140 139  More results in Results Review 142   Potassium 3.9 4.1  More results in Results Review 5.0   Chloride 106 101  More results in Results Review 104   CO2 25 26  More results in Results Review 28   BUN 13 10  More results in Results Review 12   Creatinine 0.8 0.8  More results in Results Review 0.8   Calcium 8.2 8.9  More results in Results Review 9.2   Albumin  --  3.9  --  4.0   Protein, Total  --  6.8  --  7.1   Bilirubin, Total  --  0.8  --  0.4   Alkaline Phosphatase  --  74  --  81   ALT  --  13  --  20   AST (SGOT)  --  19  --  34   Glucose 106* 126*  More results in Results Review 106*   More results in Results Review = values in this interval not displayed.           Radiology:     Radiology Results (24 Hour)     ** No results found for the last 24 hours. **          Signed by: Theresia Lo, MD  Date/time: 03/13/20 5:36 PM

## 2020-03-14 ENCOUNTER — Inpatient Hospital Stay: Payer: Medicare Other

## 2020-03-14 LAB — BASIC METABOLIC PANEL
Anion Gap: 14 (ref 5.0–15.0)
BUN: 5 mg/dL — ABNORMAL LOW (ref 7–19)
CO2: 22 mEq/L (ref 22–29)
Calcium: 8.5 mg/dL (ref 7.9–10.2)
Chloride: 107 mEq/L (ref 100–111)
Creatinine: 0.7 mg/dL (ref 0.6–1.0)
Glucose: 86 mg/dL (ref 70–100)
Potassium: 3.8 mEq/L (ref 3.5–5.1)
Sodium: 143 mEq/L (ref 136–145)

## 2020-03-14 LAB — CBC
Absolute NRBC: 0 10*3/uL (ref 0.00–0.00)
Hematocrit: 35.8 % (ref 34.7–43.7)
Hgb: 11.1 g/dL — ABNORMAL LOW (ref 11.4–14.8)
MCH: 26.9 pg (ref 25.1–33.5)
MCHC: 31 g/dL — ABNORMAL LOW (ref 31.5–35.8)
MCV: 86.7 fL (ref 78.0–96.0)
MPV: 11.2 fL (ref 8.9–12.5)
Nucleated RBC: 0 /100 WBC (ref 0.0–0.0)
Platelets: 328 10*3/uL (ref 142–346)
RBC: 4.13 10*6/uL (ref 3.90–5.10)
RDW: 13 % (ref 11–15)
WBC: 8.89 10*3/uL (ref 3.10–9.50)

## 2020-03-14 LAB — SEDIMENTATION RATE: Sed Rate: 33 mm/Hr — ABNORMAL HIGH (ref 0–20)

## 2020-03-14 LAB — GFR: EGFR: >60

## 2020-03-14 LAB — GLUCOSE WHOLE BLOOD - POCT: Whole Blood Glucose POCT: 85 mg/dL (ref 70–100)

## 2020-03-14 LAB — CEA: CEA: 0.6 ng/mL (ref 0.0–5.0)

## 2020-03-14 MED ORDER — SODIUM CHLORIDE 0.9 % IV SOLN
16.00 mg | Freq: Once | INTRAVENOUS | Status: AC
Start: 2020-03-14 — End: 2020-03-14
  Administered 2020-03-14: 16 mg via INTRAVENOUS
  Filled 2020-03-14: qty 1.6

## 2020-03-14 MED ORDER — VANCOMYCIN ORAL SOLUTION 50 MG/ML UNIT DOSE
125.00 mg | Freq: Four times a day (QID) | ORAL | Status: DC
Start: 2020-03-14 — End: 2020-03-18
  Administered 2020-03-14 – 2020-03-18 (×17): 125 mg via ORAL
  Filled 2020-03-14 (×19): qty 5

## 2020-03-14 MED ORDER — IOHEXOL 350 MG/ML IV SOLN
100.00 mL | Freq: Once | INTRAVENOUS | Status: AC | PRN
Start: 2020-03-14 — End: 2020-03-14
  Administered 2020-03-14: 100 mL via INTRAVENOUS

## 2020-03-14 MED ORDER — RISAQUAD PO CAPS
1.00 | ORAL_CAPSULE | Freq: Every day | ORAL | Status: DC
Start: 2020-03-14 — End: 2020-03-20
  Administered 2020-03-14 – 2020-03-20 (×7): 1 via ORAL
  Filled 2020-03-14 (×7): qty 1

## 2020-03-14 MED ORDER — PIPERACILLIN-TAZOBACTAM 4.5 GM MBP (CNR)
4.50 g | Freq: Three times a day (TID) | INTRAVENOUS | Status: DC
Start: 2020-03-14 — End: 2020-03-19
  Administered 2020-03-14 – 2020-03-19 (×15): 4500 mg via INTRAVENOUS
  Filled 2020-03-14 (×18): qty 4500

## 2020-03-14 MED ORDER — LACTATED RINGERS IV BOLUS
500.00 mL | Freq: Once | INTRAVENOUS | Status: AC
Start: 2020-03-14 — End: 2020-03-14
  Administered 2020-03-14: 500 mL via INTRAVENOUS

## 2020-03-14 MED ORDER — THIAMINE HCL 100 MG/ML IJ SOLN
Freq: Every day | INTRAMUSCULAR | Status: AC
Start: 2020-03-14 — End: 2020-03-15
  Filled 2020-03-14 (×2): qty 1000

## 2020-03-14 NOTE — Progress Notes (Signed)
Pt had 3 loose stools today, MD notified. New orders for stool sample, contact isolation, IVF and IV Abx. NPO. Pt going for CTA. Lab called for blood draw prior to going down.

## 2020-03-14 NOTE — Plan of Care (Signed)
Pt is alert and oriented X4  Denies pain at this time, Remains NPO  No bowel movement at this time, pt on contact special isolation  Assisted to bathroom as needed  Fall and safety precautions are in place    Problem: Safety  Goal: Patient will be free from injury during hospitalization  Flowsheets (Taken 03/14/2020 2306)  Patient will be free from injury during hospitalization:   Ensure appropriate safety devices are available at the bedside   Assess for patients risk for elopement and implement Elopement Risk Plan per policy   Include patient/ family/ care giver in decisions related to safety   Provide and maintain safe environment   Use appropriate transfer methods   Hourly rounding     Problem: Pain  Goal: Pain at adequate level as identified by patient  Flowsheets (Taken 03/14/2020 2306)  Pain at adequate level as identified by patient:   Evaluate if patient comfort function goal is met   Reassess pain within 30-60 minutes of any procedure/intervention, per Pain Assessment, Intervention, Reassessment (AIR) Cycle   Assess pain on admission, during daily assessment and/or before any "as needed" intervention(s)   Evaluate patient's satisfaction with pain management progress   Consult/collaborate with Pain Service   Offer non-pharmacological pain management interventions     Problem: Altered GI Function  Goal: Fluid and electrolyte balance are achieved/maintained  Flowsheets (Taken 03/14/2020 2307)  Fluid and electrolyte balance are achieved/maintained:   Assess for confusion/personality changes   Monitor daily weight   Assess and reassess fluid and electrolyte status   Provide adequate hydration   Monitor/assess lab values and report abnormal values   Monitor intake and output every shift   Observe for seizure activity and initiate seizure precautions if indicated   Observe for cardiac arrhythmias   Monitor for muscle weakness

## 2020-03-14 NOTE — Progress Notes (Signed)
Pt is alert and oriented X4. Standby assist to go to the bathroom or walking. Heart rate was between 57-59 when shift started. Pt uses hot packs X2 at the same time for back pain. IV antibiotic given, no adverse reaction noted. IV got infiltrated, another was placed using [place] because Pt is hard stick. Staff will continue to monitor her

## 2020-03-14 NOTE — Progress Notes (Addendum)
GENERAL SURGERY PROGRESS NOTE  [facility]    Date Time: 03/14/20 5:04 PM  Patient Name: [facility] Day: 4    ASSESSMENT:     Patient Active Problem List   Diagnosis   . Malignant neoplasm of overlapping sites of right female breast   . Hypertension   . Mixed hyperlipidemia   . Hypothyroid   . Right sided numbness   . Chest discomfort   . SOB (shortness of breath)   . Left sided numbness   . Left-sided weakness   . COVID-19 ruled out by laboratory testing       PLAN:     Right-sided colitis likely infectious in etiology    Agree with CT angio, rule out ischemia    N.p.o.    Cont Zosyn    Agree with GI recs    Stool studies    CEA    No acute surgical intervention at this time    SUBJECTIVE:     Globally unchanged, continued pain on the right side of her abdomen, denies nausea vomiting, passing flatus and stool with occasional watery melena     Objective:   Current Vitals:   Vitals:    03/14/20 1300   BP: 148/69   Pulse: (!) 52   Resp: 17   Temp: 98.2 F (36.8 C)   SpO2: 98%       Intake and Output Summary (Last 24 hours):  No intake/output data recorded.    Labs:     Results     Procedure Component Value Units Date/Time    Blood Culture Aerobic/Anaerobic #2 [[id number]] Collected: 03/10/20 1231    Specimen: Arm from Blood, Venipuncture Updated: 03/14/20 1621    Narrative:      ORDER#: [id number]                                    ORDERED BY: Lovie Macadamia  SOURCE: Blood, Venipuncture arm                      COLLECTED:  03/10/20 12:31  ANTIBIOTICS AT COLL.:                                RECEIVED :  03/10/20 16:06  Culture Blood Aerobic and Anaerobic        PRELIM      03/14/20 16:21  03/11/20   No Growth after 1 day/s of incubation.  03/12/20   No Growth after 2 day/s of incubation.  03/13/20   No Growth after 3 day/s of incubation.  03/14/20   No Growth after 4 day/s of incubation.      Blood Culture Aerobic/Anaerobic #1 [[id number]] Collected: 03/10/20 1213    Specimen: Arm from  Blood, Venipuncture Updated: 03/14/20 1621    Narrative:      ORDER#: [id number]                                    ORDERED BY: Lovie Macadamia  SOURCE: Blood, Venipuncture arm                      COLLECTED:  03/10/20 12:13  ANTIBIOTICS AT COLL.:  RECEIVED :  03/10/20 16:06  Culture Blood Aerobic and Anaerobic        PRELIM      03/14/20 16:21  03/11/20   No Growth after 1 day/s of incubation.  03/12/20   No Growth after 2 day/s of incubation.  03/13/20   No Growth after 3 day/s of incubation.  03/14/20   No Growth after 4 day/s of incubation.      CBC without differential [[id number]]  (Abnormal) Collected: 03/14/20 1051    Specimen: Blood Updated: 03/14/20 1113     WBC 8.89 x10 3/uL      Hgb 11.1 g/dL      Hematocrit 44.0 %      Platelets 328 x10 3/uL      RBC 4.13 x10 6/uL      MCV 86.7 fL      MCH 26.9 pg      MCHC 31.0 g/dL      RDW 13 %      MPV 11.2 fL      Nucleated RBC 0.0 /100 WBC      Absolute NRBC 0.00 x10 3/uL     Glucose Whole Blood - POCT [[id number]] Collected: 03/14/20 0739     Updated: 03/14/20 0743     Whole Blood Glucose POCT 85 mg/dL     Sedimentation rate (ESR) [[id number]]  (Abnormal) Collected: 03/13/20 1454    Specimen: Blood Updated: 03/14/20 0728     Sed Rate 33 mm/Hr           Rads:     Radiology Results (24 Hour)     ** No results found for the last 24 hours. **            Medications:     Current Facility-Administered Medications   Medication Dose Route Frequency   . anastrozole  1 mg Oral Daily   . aspirin  81 mg Oral Daily   . enoxaparin  40 mg Subcutaneous Daily   . ezetimibe  10 mg Oral Daily   . lactobacillus/streptococcus  1 capsule Oral Daily   . levothyroxine  25 mcg Oral QAM   . lisinopril  40 mg Oral Daily   . pantoprazole  40 mg Oral QAM AC   . piperacillin-tazobactam  4.5 g Intravenous Q8H   . rosuvastatin  20 mg Oral Daily   . IV fluids with MVI, thiamine (VITAMIN B-1), folic acid   Intravenous Daily   . thiamine  100 mg Oral QAM   . vancomycin   125 mg Oral 4 times per day       acetaminophen **OR** acetaminophen, acetaminophen, alum & mag hydroxide-simethicone, Nursing communication: Adult Hypoglycemia Treatment Algorithm **AND** dextrose **AND** dextrose **AND** glucagon (rDNA), HYDROcodone-acetaminophen, HYDROcodone-acetaminophen, ketorolac, meclizine, melatonin, naloxone, ondansetron **OR** ondansetron     Physical Exam:     General appearance - alert, well appearing, and in no distress and oriented to person, place, and time  Mental status - alert, oriented to person, place, and time, normal mood, behavior, speech, dress, motor activity, and thought processes  Chest - no tachypnea, retractions or cyanosis  Heart - RRR  Abdomen -tender with rebound and guarding on right abdomen, no gross peritoneal signs, absent left-sided tenderness  Extremities - no pedal edema noted    Signed by: Annitta Jersey

## 2020-03-14 NOTE — Progress Notes (Signed)
INTERNAL MEDICINE PROGRESS NOTE  [facility], Division of Hospitalist Medicine  [facility]  [place] pager: [phone]      Date Time: 03/14/20 2:19 PM  Patient Name: Emily [name]  Attending Physician: Theresia Lo, MD    Assessment:   Principal Problem:    COVID-19 ruled out by laboratory testing  Resolved Problems:    * No resolved hospital problems. *      74 years old female with past medical history of breast cancer status post right breast mastectomy, history of hypertension, hyperlipidemia, hypothyroidism and chronic vertigo presented to hospital with acute onset abdominal pain secondary to acute colitis    Today 4     Plan:     Sepsis present on admission secondary to acute colitis, fever 102.2, WBC 20,000 with left shift  CT scan of the abdomen and pelvic without contrast showed wall thickening of the cecum and ascending colon compatible with underlying colitis most likely infectious or inflammatory  - was NPO for 2 days , Liquid diet started however her pain is worsen and she is afraid to eat . Will start her on IVF   - On levofloxacin and metronidazole- will change to Zosyn   -Elevated ESR 21 and CRP 24.4  -CEA ordered  -Patient continue to have severe abdominal pain and on the right side of the abdomen patient is extremely tender to touch and pain exacerbated by movement, abdominal x-ray does not show any free air, show colitis l repeat CT scan of the abdomen and pelvis with p.o. contrast : Showed acute colitis, surgery also consulted , no further recommendation  -Monitor WBC curve  -Monitor temperature  -For pain management I started her on tramadol for moderate pain as well as Tylenol for mild pain, patient has morphine listed at her allergy  -Appreciate GI consult and recommendation will order CT angio to roll out ischemic colitis  - Need Colonoscopy as outpt       Dehydration secondary to n.p.o. status and having diarrhea  -500 mg bolus Ringer lactate order, 1 L of banana bag  also ordered  Continue IV fluid-at rate 100 cc/h overnight for hydration      Diarrhea  -Stool studies sent for culture ova parasite  -C. difficile colitis also ordered  -Add p.o. vancomycin empirically until the results of the C. difficile colitis is back      Mild hyperglycemia recent hemoglobin A1c is less than 6    History of hypertension on lisinopril 40 mg    History of hyperlipidemia on Zetia    History of breast cancer status postmastectomy      Deep vein thrombosis prophylaxis Lovenox.     Full Code  Diet full liquid    Data Unavailable   @DISPOSITION @        Case discussed with: .nurse, surgery - GI- her Daughter Miguel Rota  on the phone   Subjective/24 hour events:     Chief Complaint   Patient presents with   . Abdominal Pain   . Generalized weakness       Last 24 h no fever , had some stool mixed with small amount of bright red blood  Today : . LOS: 4 days  Patient refused to eat or drink from last night, she had full liquid diet and pain got worse after that , still have pain in right side of the abdomen she grades pain 6 or 7  She also had 3 episodes of diarrhea today, RN  noticed that she was weak and dizzy when she walked to the bathroom  Medications:   anastrozole, 1 mg, Daily  aspirin, 81 mg, Daily  enoxaparin, 40 mg, Daily  ezetimibe, 10 mg, Daily  lactated ringers, 500 mL, Once  lactobacillus/streptococcus, 1 capsule, Daily  levoFLOXacin, 500 mg, Q24H  levothyroxine, 25 mcg, QAM  lisinopril, 40 mg, Daily  metroNIDAZOLE, 500 mg, Q8H  pantoprazole, 40 mg, QAM AC  rosuvastatin, 20 mg, Daily  IV fluids with MVI, thiamine (VITAMIN B-1), folic acid, , Daily  thiamine, 100 mg, QAM      Current Facility-Administered Medications   Medication Dose Route   . acetaminophen  650 mg Oral    Or   . acetaminophen  650 mg Rectal   . acetaminophen  650 mg Oral   . alum & mag hydroxide-simethicone  15 mL Oral   . dextrose  15 g of glucose Oral    And   . dextrose  12.5 g Intravenous    And   . glucagon (rDNA)  1  mg Intramuscular   . HYDROcodone-acetaminophen  1 tablet Oral   . HYDROcodone-acetaminophen  1 tablet Oral   . ketorolac  30 mg Intravenous   . meclizine  25 mg Oral   . melatonin  3 mg Oral   . naloxone  0.2 mg Intravenous   . ondansetron  4 mg Oral    Or   . ondansetron  4 mg Intravenous     Physical exam:   Temp:  [98.2 F (36.8 C)-99.5 F (37.5 C)] 98.2 F (36.8 C)  Heart Rate:  [52-65] 52  Resp Rate:  [16-17] 17  BP: (124-166)/(56-92) 148/69  No intake or output data in the 24 hours ending 03/14/20 1419  General: Awake, alert, oriented, no apparent distress.  Cardiovascular: Regular rate and rhythm. No murmurs, gallops or rubs noted.  Lungs: Clear to auscultation bilaterally. No wheezing, crackles or rhonchi noted.  Abdomen: Soft, Right UQ-LQ tender, +distended. No organomegaly or masses noted. Normal bowel sounds. No guarding or rebound tenderness noted.  Extremities: No edema noted. 2+ pulses throughout.  Neuro: Non-focal neurological exam.        Labs (last 72 hours):     Recent Labs   Lab 03/14/20  1051 03/13/20  1454   WBC 8.89 12.28*   Hgb 11.1* 11.1*   Hematocrit 35.8 36.3   Platelets 328 290     Recent Labs   Lab 03/11/20  0609 03/10/20  1213 03/08/20  1623 03/08/20  1623   Sodium 140 139  More results in Results Review 142   Potassium 3.9 4.1  More results in Results Review 5.0   Chloride 106 101  More results in Results Review 104   CO2 25 26  More results in Results Review 28   BUN 13 10  More results in Results Review 12   Creatinine 0.8 0.8  More results in Results Review 0.8   Calcium 8.2 8.9  More results in Results Review 9.2   Albumin  --  3.9  --  4.0   Protein, Total  --  6.8  --  7.1   Bilirubin, Total  --  0.8  --  0.4   Alkaline Phosphatase  --  74  --  81   ALT  --  13  --  20   AST (SGOT)  --  19  --  34   Glucose 106* 126*  More results in Results Review 106*  More results in Results Review = values in this interval not displayed.           Radiology:     Radiology Results (24 Hour)      ** No results found for the last 24 hours. **          Signed by: Theresia Lo, MD  Date/time: 03/14/20 2:19 PM

## 2020-03-15 LAB — CBC
Absolute NRBC: 0 10*3/uL (ref 0.00–0.00)
Hematocrit: 38.8 % (ref 34.7–43.7)
Hgb: 12.2 g/dL (ref 11.4–14.8)
MCH: 27.4 pg (ref 25.1–33.5)
MCHC: 31.4 g/dL — ABNORMAL LOW (ref 31.5–35.8)
MCV: 87 fL (ref 78.0–96.0)
MPV: 12.3 fL (ref 8.9–12.5)
Nucleated RBC: 0 /100 WBC (ref 0.0–0.0)
Platelets: 398 10*3/uL — ABNORMAL HIGH (ref 142–346)
RBC: 4.46 10*6/uL (ref 3.90–5.10)
RDW: 13 % (ref 11–15)
WBC: 11.25 10*3/uL — ABNORMAL HIGH (ref 3.10–9.50)

## 2020-03-15 LAB — STOOL FOR WBC
Eosinophils Wright Stain: NONE SEEN
Lymphocytes Wright Stain: NONE SEEN
Neutrophils Wright Stain: NONE SEEN
RBC Wright Stain: NONE SEEN

## 2020-03-15 LAB — COMPREHENSIVE METABOLIC PANEL
ALT: 13 U/L (ref 0–55)
AST (SGOT): 21 U/L (ref 5–34)
Albumin/Globulin Ratio: 1.1 (ref 0.9–2.2)
Albumin: 3.4 g/dL — ABNORMAL LOW (ref 3.5–5.0)
Alkaline Phosphatase: 84 U/L (ref 37–106)
Anion Gap: 20 — ABNORMAL HIGH (ref 5.0–15.0)
BUN: 9 mg/dL (ref 7–19)
Bilirubin, Total: 0.2 mg/dL (ref 0.2–1.2)
CO2: 17 mEq/L — ABNORMAL LOW (ref 22–29)
Calcium: 8.8 mg/dL (ref 7.9–10.2)
Chloride: 107 mEq/L (ref 100–111)
Creatinine: 0.8 mg/dL (ref 0.6–1.0)
Globulin: 3 g/dL (ref 2.0–3.6)
Glucose: 133 mg/dL — ABNORMAL HIGH (ref 70–100)
Potassium: 5 mEq/L (ref 3.5–5.1)
Protein, Total: 6.4 g/dL (ref 6.0–8.3)
Sodium: 144 mEq/L (ref 136–145)

## 2020-03-15 LAB — STOOL FOR SALMONELLA,SHIGELLA,CAMPYLOBACTER AND SHIGA TOXIN PCR
Stool Campylobacter jejunii/coli by PCR: NEGATIVE
Stool Salmonella Species by PCR: NEGATIVE
Stool Shiga Toxin by PCR: NEGATIVE
Stool Shigella Species/Enteroinvasive Escherichia coli PCR: NEGATIVE

## 2020-03-15 LAB — CEA: CEA: <0.5 ng/mL (ref 0.0–5.0)

## 2020-03-15 LAB — GFR: EGFR: >60

## 2020-03-15 NOTE — Progress Notes (Signed)
GENERAL SURGERY PROGRESS NOTE  [facility]    Date Time: 03/15/20 2:01 PM  Patient Name: [facility] Day: 5    ASSESSMENT:     Patient Active Problem List   Diagnosis   . Malignant neoplasm of overlapping sites of right female breast   . Hypertension   . Mixed hyperlipidemia   . Hypothyroid   . Right sided numbness   . Chest discomfort   . SOB (shortness of breath)   . Left sided numbness   . Left-sided weakness   . COVID-19 ruled out by laboratory testing       PLAN:     Colitis    Globally improving on antibiotics    CT angio yesterday negative for ischemia    Continue n.p.o.    Continue Zosyn    Agree with GI Recs  -Follow-up GI for role of colonoscopy  -Follow-up for BRBPR    Stool studies negative    CEA within normal limits    No acute surgical intervention planned at this time      SUBJECTIVE:   The patient is doing fairly well.  Abdominal pain is  decreased.  Current dietary status:  Diet clear liquid and tolerating fairly well.  Flatus: yes. BM:  yes.  Additional complaints: blood in stool     Objective:   Current Vitals:   Vitals:    03/15/20 1300   BP: 152/78   Pulse: 60   Resp:    Temp: 98.1 F (36.7 C)   SpO2: 98%       Intake and Output Summary (Last 24 hours):  I/O last 3 completed shifts:  In: -   Out: 450 [Urine:450]    Labs:     Results     Procedure Component Value Units Date/Time    GFR [[id number]] Collected: 03/15/20 0549     Updated: 03/15/20 1207     EGFR >60.0    Comprehensive metabolic panel [[id number]]  (Abnormal) Collected: 03/15/20 0549    Specimen: Blood Updated: 03/15/20 1207     Glucose 133 mg/dL      BUN 9 mg/dL      Creatinine 0.8 mg/dL      Sodium 782 mEq/L      Potassium 5.0 mEq/L      Chloride 107 mEq/L      CO2 17 mEq/L      Calcium 8.8 mg/dL      Protein, Total 6.4 g/dL      Albumin 3.4 g/dL      AST (SGOT) 21 U/L      ALT 13 U/L      Alkaline Phosphatase 84 U/L      Bilirubin, Total 0.2 mg/dL      Globulin 3.0 g/dL      Albumin/Globulin Ratio  1.1     Anion Gap 20.0    Stool for Salm/Shig/Campy/Shiga PCR [[id number]] Collected: 03/14/20 2356    Specimen: Stool Updated: 03/15/20 1139     Stool Salmonella Species by PCR Negative     Stool Shigella Species/Enteroinvasive Escherichia coli PCR Negative     Stool Campylobacter jejunii/coli by PCR Negative     Stool Shiga Toxin by PCR Negative    CBC without differential [[id number]]  (Abnormal) Collected: 03/15/20 0549    Specimen: Blood Updated: 03/15/20 1118     WBC 11.25 x10 3/uL      Hgb 12.2 g/dL      Hematocrit 52.8 %  Platelets 398 x10 3/uL      RBC 4.46 x10 6/uL      MCV 87.0 fL      MCH 27.4 pg      MCHC 31.4 g/dL      RDW 13 %      MPV 12.3 fL      Nucleated RBC 0.0 /100 WBC      Absolute NRBC 0.00 x10 3/uL     CEA [[id number]] Collected: 03/15/20 0549    Specimen: Blood Updated: 03/15/20 1111     CEA <0.5 ng/mL     WBC, Stool [[id number]] Collected: 03/14/20 2356    Specimen: Stool Updated: 03/15/20 0033     Neutrophils Wright Stain None Seen     Eosinophils Wright Stain None Seen     RBC Wright Stain None Seen     Lymphocytes Wright Stain None Seen    Stool Ova and Parasite Smear [[id number]] Collected: 03/14/20 2356     Updated: 03/15/20 0005    Narrative:      Contact laboratory for specimen collection requirements & for requirements for  sources other than feces.    CEA [[id number]] Collected: 03/11/20 6295    Specimen: Blood Updated: 03/14/20 1816     CEA 0.6 ng/mL           Rads:     Radiology Results (24 Hour)     Procedure Component Value Units Date/Time    CT Angiogram Abdomen Pelvis [[id number]] Collected: 03/14/20 1929    Order Status: Completed Updated: 03/14/20 1937    Narrative:      History: Colonic ischemia suspected. Right-sided colitis    FINDINGS: CT angiographic evaluation of the abdomen and pelvis is  performed with 3-D reconstructions and 100 cc Omnipaque 350. Note: Note  that CT scanning at this site  utilizes multiple dose reduction  techniques including automatic exposure  control, adjustment of the MAS  and/or KVP according to patient's size and use of iterative  reconstruction technique      Right mastectomy. Small left pleural effusion. Thick right pleural  calcification with calcific granulomas at the right lung base. Streaky  density at the right lung base is unchanged. Cardiomegaly. Small  hypodense lesion in the left hepatic lobe is too small to accurately  characterize what is likely a tiny cyst or hemangioma. Spleen and  adrenals unremarkable. Bladder wall thickening is not currently.  Bilateral renal cysts and subcentimeter hypodense lesions in both  kidneys that are too small to characterize but probably cysts. Bladder  unremarkable. Uterus absent. Wall thickening of the cecum and proximal  ascending colon again noted. Nonvisualization of the appendix. No  inflammatory stranding around the cecal base. Trace fluid inferior to  the right hepatic lobe. No pelvic free fluid. No lymphadenopathy. Bony  degenerative changes. Bilateral calcified gluteal injection granulomas.  There is now notable segment wall thickening of the splenic flexure and  descending colon as well and terminal.    There is no adenopathy or aneurysm. The main renal arteries appear  unremarkable. The celiac artery origin demonstrates mild calcification  without evidence of high-grade stenosis. Superior mesenteric artery  origin is unremarkable. The inferior mesenteric artery origin and  visualized portions of the vessel appear unremarkable. Visualized distal  branches of the SMA and IMA appear patent. Note that study would not be  sensitive for embolic phenomena or small vessel vasculitis    There is mild gallbladder wall    Triphasic contrast does not allow for visualization  of the SMV well but  the SMV appeared patent on the prior CT of 03/12/2020.      Impression:        1. Multifocal colonic wall thickening in terminal ileal thickening  compatible with a nonspecific enterocolitis  2. No visualized  thrombosis or high-grade stenosis of the SMA or celiac  artery or IMA or visible distal branches. Note that CT would not be  sensitive for detection of distal embolic phenomena or small vessel  vasculitis or ischemic changes due to prior hypotensive event ,  3. Nonspecific gallbladder wall thickening and gallbladder wall edema.     Laurena Slimmer, MD   03/14/2020 7:35 PM            Medications:     Current Facility-Administered Medications   Medication Dose Route Frequency   . anastrozole  1 mg Oral Daily   . aspirin  81 mg Oral Daily   . enoxaparin  40 mg Subcutaneous Daily   . ezetimibe  10 mg Oral Daily   . lactobacillus/streptococcus  1 capsule Oral Daily   . levothyroxine  25 mcg Oral QAM   . lisinopril  40 mg Oral Daily   . pantoprazole  40 mg Oral QAM AC   . piperacillin-tazobactam  4.5 g Intravenous Q8H   . rosuvastatin  20 mg Oral Daily   . thiamine  100 mg Oral QAM   . vancomycin  125 mg Oral 4 times per day       acetaminophen **OR** acetaminophen, acetaminophen, alum & mag hydroxide-simethicone, Nursing communication: Adult Hypoglycemia Treatment Algorithm **AND** dextrose **AND** dextrose **AND** glucagon (rDNA), HYDROcodone-acetaminophen, HYDROcodone-acetaminophen, ketorolac, meclizine, melatonin, naloxone, ondansetron **OR** ondansetron     Physical Exam:     General appearance - alert, well appearing, and in no distress and oriented to person, place, and time  Mental status - alert, oriented to person, place, and time, normal mood, behavior, speech, dress, motor activity, and thought processes  Chest - no tachypnea, retractions or cyanosis  Heart - normal rate and regular rhythm  Abdomen -tender palpation right abdomen, improved from yesterday's exam, no rebound or guarding, no peritoneal signs  Extremities - no pedal edema noted    Signed by: Annitta Jersey

## 2020-03-15 NOTE — Progress Notes (Signed)
INTERNAL MEDICINE PROGRESS NOTE  [facility], Division of Hospitalist Medicine  [facility]  [place] pager: [phone]      Date Time: 03/15/20 5:31 PM  Patient Name: Emily [name]  Attending Physician: Theresia Lo, MD    Assessment:   Principal Problem:    COVID-19 ruled out by laboratory testing  Resolved Problems:    * No resolved hospital problems. *      74 years old female with past medical history of breast cancer status post right breast mastectomy, history of hypertension, hyperlipidemia, hypothyroidism and chronic vertigo presented to hospital with acute onset abdominal pain secondary to acute colitis    Today 5     Plan:     Sepsis present on admission secondary to acute colitis, fever 102.2, WBC 20,000 with left shift  CT scan of the abdomen and pelvic without contrast showed wall thickening of the cecum and ascending colon compatible with underlying colitis most likely infectious or inflammatory  - liquid diet started   - was On levofloxacin and metronidazole- changed to Zosyn   -Elevated ESR 21 and CRP 24.4  -CEA  /wnl   -Patient continued to have severe abdominal pain and on the right side of the abdomen patient is extremely tender to touch and pain exacerbated by movement, abdominal x-ray does not show any free air, show colitis l repeat CT scan of the abdomen and pelvis with p.o. contrast : Showed acute colitis,  Two days later CT angio done and no ischemia or large art thrombosis seen .  -Monitor WBC curve  -Monitor temperature  -For pain management I started her on tramadol for moderate pain as well as Tylenol for mild pain, patient has morphine listed at her allergy  -Appreciate GI consult and recommendation r CT angio noischemia   - Need Colonoscopy as outpt   - one dose of steroid given in 6/12 and today she feels much better       Dehydration secondary to n.p.o. status and having diarrhea  -500 mg bolus Ringer lactate order, 1 L of banana bag also ordered  Today she had  liquid diet and IVF can be stopped       Diarrhea  -Stool studies sent for culture ova parasite  -C. difficile colitis also ordered  -Add p.o. vancomycin empirically until the results of the C. difficile colitis is back      Mild hyperglycemia recent hemoglobin A1c is less than 6    History of hypertension on lisinopril 40 mg    History of hyperlipidemia on Zetia    History of breast cancer status postmastectomy      Deep vein thrombosis prophylaxis Lovenox.     Full Code  Diet clear liquid    Data Unavailable   @DISPOSITION @        Case discussed with: .nurse, surgery - GI- her son EDWIN  on the phone   Subjective/24 hour events:     Chief Complaint   Patient presents with   . Abdominal Pain   . Generalized weakness       Last 24 h no fever ,   Today : . LOS: 5 days  Patient  Feels much better , still has abd pain but its 4-5 and abd is less tender today , denied any nausea or vomiting   Medications:   anastrozole, 1 mg, Daily  aspirin, 81 mg, Daily  enoxaparin, 40 mg, Daily  ezetimibe, 10 mg, Daily  lactobacillus/streptococcus, 1 capsule, Daily  levothyroxine, 25 mcg, QAM  lisinopril, 40 mg, Daily  pantoprazole, 40 mg, QAM AC  piperacillin-tazobactam, 4.5 g, Q8H  rosuvastatin, 20 mg, Daily  thiamine, 100 mg, QAM  vancomycin, 125 mg, 4 times per day      Current Facility-Administered Medications   Medication Dose Route   . acetaminophen  650 mg Oral    Or   . acetaminophen  650 mg Rectal   . acetaminophen  650 mg Oral   . alum & mag hydroxide-simethicone  15 mL Oral   . dextrose  15 g of glucose Oral    And   . dextrose  12.5 g Intravenous    And   . glucagon (rDNA)  1 mg Intramuscular   . HYDROcodone-acetaminophen  1 tablet Oral   . HYDROcodone-acetaminophen  1 tablet Oral   . ketorolac  30 mg Intravenous   . meclizine  25 mg Oral   . melatonin  3 mg Oral   . naloxone  0.2 mg Intravenous   . ondansetron  4 mg Oral    Or   . ondansetron  4 mg Intravenous     Physical exam:   Temp:  [98.1 F (36.7 C)-98.8 F  (37.1 C)] 98.1 F (36.7 C)  Heart Rate:  [57-67] 60  Resp Rate:  [18] 18  BP: (145-171)/(78-79) 152/78  No intake or output data in the 24 hours ending 03/15/20 1731  General: Awake, alert, oriented, no apparent distress.  Cardiovascular: Regular rate and rhythm. No murmurs, gallops or rubs noted.  Lungs: Clear to auscultation bilaterally. No wheezing, crackles or rhonchi noted.  Abdomen: Soft, Right UQ-LQ tender, +distended. No organomegaly or masses noted. Normal bowel sounds. No guarding or rebound tenderness noted.  Extremities: No edema noted. 2+ pulses throughout.  Neuro: Non-focal neurological exam.        Labs (last 72 hours):     Recent Labs   Lab 03/15/20  0549 03/14/20  1051   WBC 11.25* 8.89   Hgb 12.2 11.1*   Hematocrit 38.8 35.8   Platelets 398* 328     Recent Labs   Lab 03/15/20  0549 03/14/20  1710 03/11/20  0609 03/10/20  1213   Sodium 144 143  More results in Results Review 139   Potassium 5.0 3.8  More results in Results Review 4.1   Chloride 107 107  More results in Results Review 101   CO2 17* 22  More results in Results Review 26   BUN 9 5*  More results in Results Review 10   Creatinine 0.8 0.7  More results in Results Review 0.8   Calcium 8.8 8.5  More results in Results Review 8.9   Albumin 3.4*  --   --  3.9   Protein, Total 6.4  --   --  6.8   Bilirubin, Total 0.2  --   --  0.8   Alkaline Phosphatase 84  --   --  74   ALT 13  --   --  13   AST (SGOT) 21  --   --  19   Glucose 133* 86  More results in Results Review 126*   More results in Results Review = values in this interval not displayed.           Radiology:     Radiology Results (24 Hour)     ** No results found for the last 24 hours. **          Signed  by: Theresia Lo, MD  Date/time: 03/15/20 5:31 PM

## 2020-03-15 NOTE — Plan of Care (Signed)
Pt is A&Ox4. VS stable, denies pain. Able to make needs known, in no visible distress. Diet advanced to clear liquids with no nausea/vomiting. Diarrhea continues, stool sample sent to lab. Heat therapy used. Assisted to restroom as needed. Contact precautions maintained. ABX tx continues as ordered. Will continue to monitor IV for infiltration. Safety precautions in place: bed at the lowest position, call light within reach, bed alarm on, and hourly rounding conducted.       Problem: Safety  Goal: Patient will be free from injury during hospitalization  Outcome: Progressing  Flowsheets (Taken 03/15/2020 1620)  Patient will be free from injury during hospitalization:  . Assess patient's risk for falls and implement fall prevention plan of care per policy  . Use appropriate transfer methods  . Include patient/ family/ care giver in decisions related to safety  . Assess for patients risk for elopement and implement Elopement Risk Plan per policy  . Ensure appropriate safety devices are available at the bedside  . Hourly rounding  . Provide and maintain safe environment  Goal: Patient will be free from infection during hospitalization  Outcome: Progressing  Flowsheets (Taken 03/15/2020 1620)  Free from Infection during hospitalization:  . Assess and monitor for signs and symptoms of infection  . Monitor all insertion sites (i.e. indwelling lines, tubes, urinary catheters, and drains)  . Encourage patient and family to use good hand hygiene technique  . Monitor lab/diagnostic results     Problem: Infection  Goal: Free from infection  Outcome: Progressing  Flowsheets (Taken 03/15/2020 1620)  Free from infection:  . Assess for signs/symptoms of infection  . Consult/collaborate with Infection Preventionist  . Utilize isolation precautions per protocol/policy  . Utilize sepsis protocol  . Assess immunization status     Problem: Nutrition  Goal: Nutritional intake is adequate  Outcome: Progressing  Flowsheets (Taken 03/15/2020  1620)  Nutritional intake is adequate:  Marland Kitchen Monitor daily weights  . Encourage/perform oral hygiene as appropriate  . Assist patient with meals/food selection  . Allow adequate time for meals  . Encourage/administer dietary supplements as ordered (i.e. tube feed, TPN, oral, OGT/NGT, supplements)  . Consult/collaborate with Clinical Nutritionist  . Include patient/patient care companion in decisions related to nutrition  . Assess anorexia, appetite, and amount of meal/food tolerated  . Consult/collaborate with Speech Therapy (swallow evaluations)     Problem: Altered body image  Goal: Identifies positive aspects of self  Outcome: Progressing  Flowsheets (Taken 03/15/2020 1620)  Identifies positive aspects of self:  . Educate patient about body part identification  . Encourage patient/patient care companion to verbalize fears, feelings, and concerns  . Provide emotional support     Problem: Altered GI Function  Goal: Nutritional intake is adequate  Outcome: Progressing  Flowsheets (Taken 03/15/2020 1620)  Nutritional intake is adequate:  Marland Kitchen Monitor daily weights  . Encourage/perform oral hygiene as appropriate  . Assist patient with meals/food selection  . Allow adequate time for meals  . Encourage/administer dietary supplements as ordered (i.e. tube feed, TPN, oral, OGT/NGT, supplements)  . Consult/collaborate with Clinical Nutritionist  . Include patient/patient care companion in decisions related to nutrition  . Assess anorexia, appetite, and amount of meal/food tolerated  . Consult/collaborate with Speech Therapy (swallow evaluations)  Goal: Fluid and electrolyte balance are achieved/maintained  Outcome: Progressing  Flowsheets (Taken 03/14/2020 2307 by Sloan Leiter, RN)  Fluid and electrolyte balance are achieved/maintained:  . Assess for confusion/personality changes  . Monitor daily weight  . Assess and reassess  fluid and electrolyte status  . Provide adequate hydration  . Monitor/assess lab values and report  abnormal values  . Monitor intake and output every shift  . Observe for seizure activity and initiate seizure precautions if indicated  . Observe for cardiac arrhythmias  . Monitor for muscle weakness  Goal: Elimination patterns are normal or improving  Outcome: Progressing  Flowsheets (Taken 03/15/2020 1620)  Elimination patterns are normal or improving:  . Report abnormal assessment to physician  . Anticipate/assist with toileting needs  . Assess for normal bowel sounds  . Monitor for abdominal distension  . Monitor for abdominal discomfort  . Assess for signs and symptoms of bleeding.  Report signs of bleeding to physician  . Administer treatments as ordered  . Assess for flatus  . Consult/collaborate with Clinical Nutritionist  . Assess for and discuss C. diff screening with LIP  . Collaborate with LIP for containment device  . Reinforce education on foods that improve and complicate bowel movements and how activity and medications can affect bowel movements

## 2020-03-16 LAB — CBC
Absolute NRBC: 0 10*3/uL (ref 0.00–0.00)
Hematocrit: 41.5 % (ref 34.7–43.7)
Hgb: 13.1 g/dL (ref 11.4–14.8)
MCH: 27 pg (ref 25.1–33.5)
MCHC: 31.6 g/dL (ref 31.5–35.8)
MCV: 85.4 fL (ref 78.0–96.0)
MPV: 11.6 fL (ref 8.9–12.5)
Nucleated RBC: 0 /100 WBC (ref 0.0–0.0)
Platelets: 329 10*3/uL (ref 142–346)
RBC: 4.86 10*6/uL (ref 3.90–5.10)
RDW: 13 % (ref 11–15)
WBC: 13.57 10*3/uL — ABNORMAL HIGH (ref 3.10–9.50)

## 2020-03-16 LAB — BASIC METABOLIC PANEL
Anion Gap: 15 (ref 5.0–15.0)
BUN: 8 mg/dL (ref 7–19)
CO2: 23 mEq/L (ref 22–29)
Calcium: 9 mg/dL (ref 7.9–10.2)
Chloride: 106 mEq/L (ref 100–111)
Creatinine: 0.8 mg/dL (ref 0.6–1.0)
Glucose: 112 mg/dL — ABNORMAL HIGH (ref 70–100)
Potassium: 4 mEq/L (ref 3.5–5.1)
Sodium: 144 mEq/L (ref 136–145)

## 2020-03-16 LAB — ECG 12-LEAD
Atrial Rate: 56 {beats}/min
Atrial Rate: 70 {beats}/min
P Axis: 55 degrees
P Axis: 47 degrees
P-R Interval: 164 ms
P-R Interval: 172 ms
Q-T Interval: 430 ms
Q-T Interval: 348 ms
QRS Duration: 90 ms
QRS Duration: 82 ms
QTC Calculation (Bezet): 414 ms
QTC Calculation (Bezet): 375 ms
R Axis: 38 degrees
R Axis: 38 degrees
T Axis: 48 degrees
T Axis: -7 degrees
Ventricular Rate: 56 {beats}/min
Ventricular Rate: 70 {beats}/min

## 2020-03-16 LAB — GFR: EGFR: >60

## 2020-03-16 LAB — CLOSTRIDIUM DIFFICILE TOXIN B PCR: Stool Clostridium difficile Toxin B PCR: NEGATIVE

## 2020-03-16 MED ORDER — AMLODIPINE BESYLATE 5 MG PO TABS
5.0000 mg | ORAL_TABLET | Freq: Every day | ORAL | Status: DC
Start: 2020-03-16 — End: 2020-03-19
  Administered 2020-03-16 – 2020-03-19 (×4): 5 mg via ORAL
  Filled 2020-03-16 (×4): qty 1

## 2020-03-16 NOTE — Progress Notes (Signed)
Emily [name] MRN: [id number]  74 y.o.  female    NUTRITION: LOS    Dx of acute colitis. Pt has had poor PO intake and diarrhea in last 2 weeks. On clear liquids - very poor po intake secondary to stomach pain (not because of food). C/o gas and stomach pain. 1.3% wt loss in last 3 months: 158 lbs on 12/10/19, 156 lbs on 03/11/20. Meets < 50% of estimated needs x 6 days via oral intake since this admission (03/10/20).     GI: ongoing diarrhea x almost 2 weeks per pt    PMHx: HTN, HLD, Hypothyroidism, Breast cancer, GERD    Assessment   Past Medical History:   Diagnosis Date   . Breast cancer 2014    right breast mastectomy   . GERD (gastroesophageal reflux disease)    . Hyperlipidemia    . Hypertension    . Hypothyroidism    . Malignant neoplasm of overlapping sites of right female breast 10/19/2015   . Vertigo      Wt Readings from Last 30 Encounters:   03/11/20 71.2 kg (156 lb 15.5 oz)   03/08/20 71.2 kg (156 lb 15.5 oz)   02/27/20 72.1 kg (159 lb)   01/27/20 71.6 kg (157 lb 12.8 oz)   01/27/20 75.7 kg (166 lb 14.2 oz)   12/28/19 71.5 kg (157 lb 10.1 oz)   12/10/19 71.7 kg (158 lb)   08/14/19 68.6 kg (151 lb 3.2 oz)   08/08/19 68 kg (150 lb)   07/01/19 69.4 kg (153 lb)   04/15/19 65.5 kg (144 lb 8 oz)   03/15/19 70.3 kg (155 lb)   01/11/19 71.6 kg (157 lb 13.6 oz)   01/01/19 71.6 kg (157 lb 13.6 oz)   11/13/18 71.8 kg (158 lb 3.2 oz)   11/13/18 74 kg (163 lb 2.3 oz)   09/13/18 73.5 kg (162 lb)   07/18/18 73.9 kg (163 lb)   06/20/18 73.5 kg (162 lb)   05/14/18 73.3 kg (161 lb 8 oz)   05/09/18 74.4 kg (164 lb)   04/03/18 74.1 kg (163 lb 4.8 oz)   03/15/18 73.9 kg (163 lb)   02/07/18 75.6 kg (166 lb 10.7 oz)   01/19/18 73.5 kg (162 lb)   12/13/17 76.1 kg (167 lb 12.8 oz)   09/25/17 76.2 kg (168 lb)   09/13/17 75.1 kg (165 lb 9.1 oz)   07/10/17 75.4 kg (166 lb 3.6 oz)   06/23/17 76 kg (167 lb 9.6 oz)       Active Hospital Problems    Diagnosis   . COVID-19 ruled out by laboratory testing       Allergies   Allergen Reactions   .  Morphine Itching   . Morphine And Related Itching and Nausea And Vomiting     dizzy   . Caffeine      Personal preference     Skin: intact per flowsheets  NFPE:  Head: temple region: slight depression (mild muscle loss)      Orders Placed This Encounter   Procedures   . Diet clear liquid      Current Meds:  amLODIPine, 5 mg, Daily  anastrozole, 1 mg, Daily  aspirin, 81 mg, Daily  enoxaparin, 40 mg, Daily  ezetimibe, 10 mg, Daily  lactobacillus/streptococcus, 1 capsule, Daily  levothyroxine, 25 mcg, QAM  lisinopril, 40 mg, Daily  pantoprazole, 40 mg, QAM AC  piperacillin-tazobactam, 4.5 g, Q8H  rosuvastatin, 20 mg, Daily  thiamine, 100  mg, QAM  vancomycin, 125 mg, 4 times per day          Recent Labs:  Recent Labs   Lab 03/16/20  1332 03/15/20  0549 03/14/20  1051   WBC 13.57* 11.25* 8.89   Hgb 13.1 12.2 11.1*   Hematocrit 41.5 38.8 35.8   MCV 85.4 87.0 86.7   Platelets 329 398* 328     Recent Labs   Lab 03/16/20  1332 03/15/20  0549 03/14/20  1710 03/11/20  0609 03/10/20  1213   Sodium 144 144 143 140 139   Potassium 4.0 5.0 3.8 3.9 4.1   Chloride 106 107 107 106 101   CO2 23 17* 22 25 26    BUN 8 9 5* 13 10   Creatinine 0.8 0.8 0.7 0.8 0.8   Glucose 112* 133* 86 106* 126*   Calcium 9.0 8.8 8.5 8.2 8.9   EGFR >60.0 >60.0 >60.0 >60.0 >60.0     Recent Labs   Lab 03/15/20  0549 03/10/20  1213   Albumin 3.4* 3.9       Intake/Output Summary (Last 24 hours) at 03/16/2020 1512  Last data filed at 03/16/2020 2952  Gross per 24 hour   Intake 340 ml   Output --   Net 340 ml         Anthropometrics  Height: 160 cm (5' 2.99")  Weight: 71.2 kg (156 lb 15.5 oz)  Weight Change: 0  IBW/kg (Calculated) Female: 56.35 kg  IBW/kg (Calculated) Female: 52.25 kg  BMI (calculated): 27.9    Estimated Nutrition Needs:  Estimated Energy Needs  Total Energy Estimated Needs: 1420 - 1775 Kcal  Method for Estimating Needs: 20 - 25 Kcal/Kg    Estimated Protein Needs  Total Protein Estimated Needs: 85 - 107 g  Method for Estimating Needs: 1.2 - 1.5  g/Kg    Fluid Needs  Total Fluid Estimated Needs: 1420 - 1775 ml or as needed  Method for Estimating Needs: 1 ml/Kcal      Learning & Discharge Planning Needs: None at this time.   Religious/Cultural Food Practices: No    Nutrition Diagnosis:   Inadequate oral intake related to medical condition such as colitis and malabsorption due to ongoing diarrhea as evidenced by < 50% of estimated needs x 6 days since this admission (03/10/20).     Intervention:  1.) Clear liquids    2.) Ensure clear TID: 240 Kcal and 8 g protein per serving     3.) Gelatein x 1: 130 Kcal and 20 g protein per serving     Goals:  1.) Pt will meet > 50% of estimated needs within 24-48 hours.     M/E:  Monitor PO/ONS intake/tolerance, weight, labs (follow up for C.Diff test), GI issues (diarrhea) and skin integrity.  Follow up within 1-5 days.  Skiler Tye Kipp Brood, RD x [phone]

## 2020-03-16 NOTE — Progress Notes (Signed)
PROGRESS NOTE    Date Time: 03/16/20 4:27 AM  Patient Name: Emily [name]  Surgical Attending: Madie Reno., MD    Subjective:      I have a lot of diarrhea.  Less pain then before.    Objective:     Vitals:    Temp (24hrs), Avg:98.5 F (36.9 C), Min:98.1 F (36.7 C), Max:99.1 F (37.3 C)      Temp: Temp: 99.1 F (37.3 C)   HR: Heart Rate: 62   BP: BP: 148/79   RR: Resp Rate: 17   SpO2 SpO2: 100 %   POCT Glucose: POCT Glucose Result (Read Only): 85     Physical Exam:     General: awake, alert, oriented x 3; no acute distress.  Neck: supple, no lymphadenopathy, no thyromegaly  Cardiovascular: regular rate and rhythm, no murmurs, rubs or gallops  Lungs: clear to auscultation bilaterally, without wheezing, rhonchi, or rales  Abdomen: soft, mildly tender, mildly distended; no palpable masses, no hepatosplenomegaly, normoactive bowel sounds, no rebound or guarding;   Skin: no rashes or lesions noted      Results     Procedure Component Value Units Date/Time    STOOL CULTURE FOR AEROMONAS AND PLESIOMONAS ONLY [[id number]] Collected: 03/15/20 1501    Specimen: Stool Updated: 03/15/20 2225    Narrative:      Theodoro Doing    Blood Culture Aerobic/Anaerobic #1 [[id number]] Collected: 03/10/20 1213    Specimen: Arm from Blood, Venipuncture Updated: 03/15/20 1821    Narrative:      ORDER#: [id number]                                    ORDERED BY: Lovie Macadamia  SOURCE: Blood, Venipuncture arm                      COLLECTED:  03/10/20 12:13  ANTIBIOTICS AT COLL.:                                RECEIVED :  03/10/20 16:06  Culture Blood Aerobic and Anaerobic        FINAL       03/15/20 18:21  03/15/20   No growth after 5 days of incubation.      Blood Culture Aerobic/Anaerobic #2 [[id number]] Collected: 03/10/20 1231    Specimen: Arm from Blood, Venipuncture Updated: 03/15/20 1821    Narrative:      ORDER#: [id number]                                    ORDERED BY: Lovie Macadamia  SOURCE: Blood, Venipuncture arm                       COLLECTED:  03/10/20 12:31  ANTIBIOTICS AT COLL.:                                RECEIVED :  03/10/20 16:06  Culture Blood Aerobic and Anaerobic        FINAL       03/15/20 18:21  03/15/20   No growth after 5 days of incubation.      GFR [[id number]] Collected: 03/15/20 0549  Updated: 03/15/20 1207     EGFR >60.0    Comprehensive metabolic panel [[id number]]  (Abnormal) Collected: 03/15/20 0549    Specimen: Blood Updated: 03/15/20 1207     Glucose 133 mg/dL      BUN 9 mg/dL      Creatinine 0.8 mg/dL      Sodium 413 mEq/L      Potassium 5.0 mEq/L      Chloride 107 mEq/L      CO2 17 mEq/L      Calcium 8.8 mg/dL      Protein, Total 6.4 g/dL      Albumin 3.4 g/dL      AST (SGOT) 21 U/L      ALT 13 U/L      Alkaline Phosphatase 84 U/L      Bilirubin, Total 0.2 mg/dL      Globulin 3.0 g/dL      Albumin/Globulin Ratio 1.1     Anion Gap 20.0    Stool for Salm/Shig/Campy/Shiga PCR [[id number]] Collected: 03/14/20 2356    Specimen: Stool Updated: 03/15/20 1139     Stool Salmonella Species by PCR Negative     Stool Shigella Species/Enteroinvasive Escherichia coli PCR Negative     Stool Campylobacter jejunii/coli by PCR Negative     Stool Shiga Toxin by PCR Negative    CBC without differential [[id number]]  (Abnormal) Collected: 03/15/20 0549    Specimen: Blood Updated: 03/15/20 1118     WBC 11.25 x10 3/uL      Hgb 12.2 g/dL      Hematocrit 74.2 %      Platelets 398 x10 3/uL      RBC 4.46 x10 6/uL      MCV 87.0 fL      MCH 27.4 pg      MCHC 31.4 g/dL      RDW 13 %      MPV 12.3 fL      Nucleated RBC 0.0 /100 WBC      Absolute NRBC 0.00 x10 3/uL     CEA [[id number]] Collected: 03/15/20 0549    Specimen: Blood Updated: 03/15/20 1111     CEA <0.5 ng/mL           Radiology Results (24 Hour)     ** No results found for the last 24 hours. **          Assessment/Plan:     Hospital Day: 6     Colitis  Abdominal pain  Diarrhea    Patient's colitis is likely infectious in nature.  Consider ID consult if not already on board.   Agree with GI re-consult to see if colonoscopy warranted to clear up diagnosis.  We will follow.     Madie Reno., MD  03/16/2020 4:27 AM

## 2020-03-16 NOTE — Progress Notes (Signed)
INTERNAL MEDICINE PROGRESS NOTE  [facility], Division of Hospitalist Medicine  [facility]  [place] pager: [phone]      Date Time: 03/16/20 12:56 PM  Patient Name: Emily [name]  Attending Physician: Theresia Lo, MD    Assessment:   Principal Problem:    COVID-19 ruled out by laboratory testing  Resolved Problems:    * No resolved hospital problems. *      74 years old female with past medical history of breast cancer status post right breast mastectomy, history of hypertension, hyperlipidemia, hypothyroidism and chronic vertigo presented to hospital with acute onset abdominal pain secondary to acute colitis    Today 6     Plan:     Sepsis present on admission secondary to acute colitis, fever 102.2, WBC 20,000 with left shift  CT scan of the abdomen and pelvic without contrast showed wall thickening of the cecum and ascending colon compatible with underlying colitis most likely infectious or inflammatory  - liquid diet started .  - was On levofloxacin and metronidazole- changed to Zosyn   -Elevated ESR 21 and CRP 24.4  -CEA  /wnl   -Patient continued to have severe abdominal pain and on the right side of the abdomen patient is extremely tender to touch and pain exacerbated by movement, abdominal x-ray does not show any free air, show colitis l repeat CT scan of the abdomen and pelvis with p.o. contrast : Showed acute colitis,  Two days later CT angio done and no ischemia or large art thrombosis seen .  -Monitor WBC curve  -Monitor temperature  -For pain management I started her on tramadol for moderate pain as well as Tylenol for mild pain, patient has morphine listed at her allergy  -Appreciate GI consult and recommendation r CT angio which is  Non-ischemia   - Need Colonoscopy as outpt   - one dose of steroid given in 6/12 and she feels much better after .  - C diff colitis is less likely , however stool was sent ( pt had more than 5-6 diarrhea ) Call Microbiology [phone] to run the  test again ,     Dehydration secondary to n.p.o. status and having diarrhea  -500 mg bolus Ringer lactate order, 1 L of banana bag also ordered  Now on  liquid diet and IVF can be stopped       Diarrhea  -Stool studies sent for culture ova parasite- they are all negative   -C. difficile colitis also ordered- test canceled by  SL [id number] - called the number and unable to reach anybody - dw Micro to run the test again   -Add p.o. vancomycin empirically until the results of the C. difficile colitis is back      Mild hyperglycemia recent hemoglobin A1c is less than 6    History of hypertension on lisinopril 40 mg, BO still high ,add norvasc     History of hyperlipidemia on Zetia    History of breast cancer status postmastectomy      Deep vein thrombosis prophylaxis Lovenox.     Full Code  Diet clear liquid    Data Unavailable   @DISPOSITION @        Case discussed with: .nurse, surgery - GI- her son EDWIN  on the phone - dw Lafonda Mosses daughter on the phone     Subjective/24 hour events:     Chief Complaint   Patient presents with   . Abdominal Pain   . Generalized  weakness       Last 24 h no fever ,   Today : . LOS: 6 days  Patient   Still has pain , no nausea or vomiting , tolerate liquid diet but had 6 loose BM   Medications:   anastrozole, 1 mg, Daily  aspirin, 81 mg, Daily  enoxaparin, 40 mg, Daily  ezetimibe, 10 mg, Daily  lactobacillus/streptococcus, 1 capsule, Daily  levothyroxine, 25 mcg, QAM  lisinopril, 40 mg, Daily  pantoprazole, 40 mg, QAM AC  piperacillin-tazobactam, 4.5 g, Q8H  rosuvastatin, 20 mg, Daily  thiamine, 100 mg, QAM  vancomycin, 125 mg, 4 times per day      Current Facility-Administered Medications   Medication Dose Route   . acetaminophen  650 mg Oral    Or   . acetaminophen  650 mg Rectal   . acetaminophen  650 mg Oral   . alum & mag hydroxide-simethicone  15 mL Oral   . dextrose  15 g of glucose Oral    And   . dextrose  12.5 g Intravenous    And   . glucagon (rDNA)  1 mg Intramuscular   .  HYDROcodone-acetaminophen  1 tablet Oral   . HYDROcodone-acetaminophen  1 tablet Oral   . meclizine  25 mg Oral   . melatonin  3 mg Oral   . naloxone  0.2 mg Intravenous   . ondansetron  4 mg Oral    Or   . ondansetron  4 mg Intravenous     Physical exam:   Temp:  [98.1 F (36.7 C)-99.1 F (37.3 C)] 98.2 F (36.8 C)  Heart Rate:  [53-62] 61  Resp Rate:  [17] 17  BP: (148-158)/(69-80) 156/80    Intake/Output Summary (Last 24 hours) at 03/16/2020 1256  Last data filed at 03/16/2020 0829  Gross per 24 hour   Intake 340 ml   Output --   Net 340 ml     General: Awake, alert, oriented, no apparent distress.  Cardiovascular: Regular rate and rhythm. No murmurs, gallops or rubs noted.  Lungs: Clear to auscultation bilaterally. No wheezing, crackles or rhonchi noted.  Abdomen: Soft, Right UQ-LQ tender, +distended. No organomegaly or masses noted. Normal bowel sounds. No guarding or rebound tenderness noted.  Extremities: No edema noted. 2+ pulses throughout.  Neuro: Non-focal neurological exam.        Labs (last 72 hours):     Recent Labs   Lab 03/15/20  0549 03/14/20  1051   WBC 11.25* 8.89   Hgb 12.2 11.1*   Hematocrit 38.8 35.8   Platelets 398* 328     Recent Labs   Lab 03/15/20  0549 03/14/20  1710 03/11/20  0609 03/10/20  1213   Sodium 144 143  More results in Results Review 139   Potassium 5.0 3.8  More results in Results Review 4.1   Chloride 107 107  More results in Results Review 101   CO2 17* 22  More results in Results Review 26   BUN 9 5*  More results in Results Review 10   Creatinine 0.8 0.7  More results in Results Review 0.8   Calcium 8.8 8.5  More results in Results Review 8.9   Albumin 3.4*  --   --  3.9   Protein, Total 6.4  --   --  6.8   Bilirubin, Total 0.2  --   --  0.8   Alkaline Phosphatase 84  --   --  74  ALT 13  --   --  13   AST (SGOT) 21  --   --  19   Glucose 133* 86  More results in Results Review 126*   More results in Results Review = values in this interval not displayed.            Radiology:     Radiology Results (24 Hour)     ** No results found for the last 24 hours. **          Signed by: Theresia Lo, MD  Date/time: 03/16/20 12:56 PM

## 2020-03-16 NOTE — Progress Notes (Addendum)
Pt had X 4 small soft brown BM using the bedside commode. She is standby assist with transfers. Pt tolerated full liquid diet, no nauseas or vomiting. She remains on isolation. Denies pain or pain meds. Purposeful rounding done to assess her needs. Safety maintained. Call light was answered on time to help her  Transfers to the bedside commode.

## 2020-03-17 DIAGNOSIS — C50811 Malignant neoplasm of overlapping sites of right female breast: Secondary | ICD-10-CM

## 2020-03-17 LAB — CLOSTRIDIUM DIFFICILE TOXIN B PCR: Stool Clostridium difficile Toxin B PCR: NEGATIVE

## 2020-03-17 MED ORDER — SODIUM CHLORIDE 0.9 % IV SOLN
INTRAVENOUS | Status: DC
Start: 2020-03-17 — End: 2020-03-18

## 2020-03-17 MED ORDER — LOPERAMIDE HCL 2 MG PO CAPS
2.00 mg | ORAL_CAPSULE | Freq: Four times a day (QID) | ORAL | Status: DC | PRN
Start: 2020-03-17 — End: 2020-03-20
  Administered 2020-03-17 – 2020-03-20 (×5): 2 mg via ORAL
  Filled 2020-03-17 (×5): qty 1

## 2020-03-17 NOTE — Progress Notes (Signed)
GENERAL SURGERY PROGRESS NOTE  [facility]    Date Time: 03/17/20 10:48 AM  Patient Name: [facility] Day: 7    ASSESSMENT:     Patient Active Problem List   Diagnosis   . Malignant neoplasm of overlapping sites of right female breast   . Hypertension   . Mixed hyperlipidemia   . Hypothyroid   . Right sided numbness   . Chest discomfort   . SOB (shortness of breath)   . Left sided numbness   . Left-sided weakness   . COVID-19 ruled out by laboratory testing       PLAN:     Right-sided colitis    CT angio negative for ischemia    Copious diarrhea, stool studies negative to date, C. difficile negative    No surgical intervention planned at this time, advance diet as tolerated    We will sign off, please contact us with further questions or concerns    SUBJECTIVE:   The patient is doing fairly well.  Abdominal pain is  decreased.  Current dietary status:  Diet clear liquid  Ensure Clear Quantity: A. One; Flavor: Special educational needs teacher; Frequency: TID (3 times a day) with meals  Gelatein Plus Quantity: A. One; Flavor: Lime; Frequency: Daily with lunch and tolerating well.  Flatus: yes. BM:  yes.  Additional complaints: diarrhea     Objective:   Current Vitals:   Vitals:    03/17/20 0812   BP: 182/70   Pulse: (!) 52   Resp: 18   Temp: 99.1 F (37.3 C)   SpO2: 98%       Intake and Output Summary (Last 24 hours):  I/O last 3 completed shifts:  In: 480 [P.O.:480]  Out: -     Labs:     Results     Procedure Component Value Units Date/Time    Clostridium difficile toxin B PCR [[id number]] Collected: 03/16/20 1838    Specimen: Stool Updated: 03/17/20 0633     Stool Clostridium difficile Toxin B PCR Negative    STOOL CULTURE FOR AEROMONAS AND PLESIOMONAS ONLY [[id number]] Collected: 03/15/20 1501    Specimen: Stool Updated: 03/16/20 2009    Narrative:      ORDER#: [id number]                                    ORDERED BY: Nyra Capes, SOOPHI  SOURCE: Stool                                        COLLECTED:   03/15/20 15:01  ANTIBIOTICS AT COLL.:                                RECEIVED :  03/15/20 22:25  Culture, Stool, AEROMONAS and PLESIOMONAS OFINAL       03/16/20 20:09  03/16/20   No Aeromonas or Plesiomonas isolated      Clostridium difficile toxin B PCR [[id number]] Collected: 03/15/20 1501     Updated: 03/16/20 1532     Stool Clostridium difficile Toxin B PCR Negative    Narrative:      Order was cancelled by [id number] 03/15/20 15:58, It is laboratory policy that  this test not be performed on  stool specimens collected  after the patient  has been  hospitalized longer than three days.  Exceptions to the policy may  be made for HIV-positive patients or due to unusual circumstances. In such  cases the ordering physician must contact Microbiology.   [id number] notified  [id number]  03/15/2020   16:01          Rads:     Radiology Results (24 Hour)     ** No results found for the last 24 hours. **            Medications:     Current Facility-Administered Medications   Medication Dose Route Frequency   . amLODIPine  5 mg Oral Daily   . anastrozole  1 mg Oral Daily   . aspirin  81 mg Oral Daily   . enoxaparin  40 mg Subcutaneous Daily   . ezetimibe  10 mg Oral Daily   . lactobacillus/streptococcus  1 capsule Oral Daily   . levothyroxine  25 mcg Oral QAM   . lisinopril  40 mg Oral Daily   . pantoprazole  40 mg Oral QAM AC   . piperacillin-tazobactam  4.5 g Intravenous Q8H   . rosuvastatin  20 mg Oral Daily   . thiamine  100 mg Oral QAM   . vancomycin  125 mg Oral 4 times per day       acetaminophen **OR** acetaminophen, acetaminophen, alum & mag hydroxide-simethicone, Nursing communication: Adult Hypoglycemia Treatment Algorithm **AND** dextrose **AND** dextrose **AND** glucagon (rDNA), HYDROcodone-acetaminophen, HYDROcodone-acetaminophen, meclizine, melatonin, naloxone, ondansetron **OR** ondansetron     Physical Exam:     General appearance - alert, well appearing, and in no distress and oriented to person, place, and time  Mental  status - alert, oriented to person, place, and time, normal mood, behavior, speech, dress, motor activity, and thought processes  Chest - no tachypnea, retractions or cyanosis  Heart - normal rate and regular rhythm  Abdomen -soft, nondistended, less tender than prior exam, no rebound or guarding today, no peritoneal signs  Extremities - no pedal edema noted    Signed by: Annitta Jersey

## 2020-03-17 NOTE — Progress Notes (Signed)
Pt tolerated oral Vanco and IV zosyn with no adverse effect. Pt had no BM during this shift. She slept all night with no pain or discomfort.

## 2020-03-17 NOTE — Progress Notes (Signed)
Pt is A/O X 4,  She is standby assist with transfers. Pt tolerated full liquid diet, no nauseas or vomiting.  Isolation discontinued. Denies pain . Purposeful rounding done to assess her needs. Safety maintained. Pt tolerated oral Vanco and IV zosyn with no adverse effect. Pt had 3X BM during this shift. Trio with attending

## 2020-03-17 NOTE — Progress Notes (Signed)
INTERNAL MEDICINE PROGRESS NOTE  [facility], Division of Hospitalist Medicine  [facility]  [place] pager: [phone]      Date Time: 03/17/20 2:33 PM  Patient Name: [name]  Attending Physician: Laretta Bolster, MD    Assessment:   Principal Problem:    COVID-19 ruled out by laboratory testing  Resolved Problems:    * No resolved hospital problems. *    74 years old female with past medical history of breast cancer status post right breast mastectomy, history of hypertension, hyperlipidemia, hypothyroidism and chronic vertigo presented to hospital with acute onset abdominal pain secondary to acute colitis.    Today 7     Plan:     Sepsis present on admission secondary to acute colitis, fever 102.2, WBC 20,000 with left shift  CT scan of the abdomen and pelvic without contrast showed wall thickening of the cecum and ascending colon compatible with underlying colitis most likely infectious or inflammatory  - liquid diet started .  - was On levofloxacin and metronidazole- changed to Zosyn   -Elevated ESR 21 and CRP 24.4  -CEA  /wnl   -Patient continued to have severe abdominal pain and on the right side of the abdomen patient is extremely tender to touch and pain exacerbated by movement, abdominal x-ray does not show any free air, show colitis l repeat CT scan of the abdomen and pelvis with p.o. contrast : Showed acute colitis,  Two days later CT angio done and no ischemia or large art thrombosis seen .  -Monitor WBC curve  -Monitor temperature  -For pain management I started her on tramadol for moderate pain as well as Tylenol for mild pain, patient has morphine listed at her allergy  -Appreciate GI consult and recommendation r CT angio which is  Non-ischemia   - Need Colonoscopy as outpt   - one dose of steroid given in 6/12 and she feels much better after .  -Negative for C. Difficile  - IV fluids     Dehydration secondary to n.p.o. status and having diarrhea  -500 mg bolus Ringer lactate  order, 1 L of banana bag also ordered  Now on  liquid diet and IVF can be stopped     Diarrhea  -Stool studies sent for culture ova parasite- they are all negative   -neg. C.diff  D/c po Vancomycin      Mild hyperglycemia recent hemoglobin A1c is less than 6    History of hypertension on lisinopril 40 mg, BO still high ,add norvasc     History of hyperlipidemia on Zetia    History of breast cancer status postmastectomy      Deep vein thrombosis prophylaxis Lovenox.     Full Code  Ensure Clear Quantity: A. One; Flavor: Special educational needs teacher; Frequency: TID (3 times a day) with meals  Gelatein Plus Quantity: A. One; Flavor: Lime; Frequency: Daily with lunch  Diet full liquid      Case discussed with:pt, nurse, surgery    Subjective/24 hour events:     Chief Complaint   Patient presents with   . Abdominal Pain   . Generalized weakness       Last 24 h no fever   Today : . LOS: 7 days      States pain is better today but loose BM x 2     Medications:   amLODIPine, 5 mg, Daily  anastrozole, 1 mg, Daily  aspirin, 81 mg, Daily  enoxaparin, 40 mg, Daily  ezetimibe, 10 mg, Daily  lactobacillus/streptococcus, 1 capsule, Daily  levothyroxine, 25 mcg, QAM  lisinopril, 40 mg, Daily  pantoprazole, 40 mg, QAM AC  piperacillin-tazobactam, 4.5 g, Q8H  rosuvastatin, 20 mg, Daily  thiamine, 100 mg, QAM  vancomycin, 125 mg, 4 times per day      Current Facility-Administered Medications   Medication Dose Route   . acetaminophen  650 mg Oral    Or   . acetaminophen  650 mg Rectal   . acetaminophen  650 mg Oral   . alum & mag hydroxide-simethicone  15 mL Oral   . dextrose  15 g of glucose Oral    And   . dextrose  12.5 g Intravenous    And   . glucagon (rDNA)  1 mg Intramuscular   . HYDROcodone-acetaminophen  1 tablet Oral   . HYDROcodone-acetaminophen  1 tablet Oral   . meclizine  25 mg Oral   . melatonin  3 mg Oral   . naloxone  0.2 mg Intravenous   . ondansetron  4 mg Oral    Or   . ondansetron  4 mg Intravenous     Physical exam:   Temp:   [97.7 F (36.5 C)-99.1 F (37.3 C)] 98 F (36.7 C)  Heart Rate:  [51-63] 51  Resp Rate:  [18-20] 19  BP: (130-182)/(65-82) 147/70    Intake/Output Summary (Last 24 hours) at 03/17/2020 1433  Last data filed at 03/16/2020 1750  Gross per 24 hour   Intake 120 ml   Output --   Net 120 ml     General: Awake, alert, oriented, no apparent distress.  Cardiovascular: Regular rate and rhythm. No murmurs, gallops or rubs noted.  Lungs: Clear to auscultation bilaterally. No wheezing, crackles or rhonchi noted.  Abdomen: Soft, Right UQ-LQ tender, +distended. No organomegaly or masses noted. Normal bowel sounds. No guarding or rebound tenderness noted.  Extremities: No edema noted. 2+ pulses throughout.  Neuro: Non-focal neurological exam.        Labs (last 72 hours):     Recent Labs   Lab 03/16/20  1332 03/15/20  0549   WBC 13.57* 11.25*   Hgb 13.1 12.2   Hematocrit 41.5 38.8   Platelets 329 398*     Recent Labs   Lab 03/16/20  1332 03/15/20  0549   Sodium 144 144   Potassium 4.0 5.0   Chloride 106 107   CO2 23 17*   BUN 8 9   Creatinine 0.8 0.8   Calcium 9.0 8.8   Albumin  --  3.4*   Protein, Total  --  6.4   Bilirubin, Total  --  0.2   Alkaline Phosphatase  --  84   ALT  --  13   AST (SGOT)  --  21   Glucose 112* 133*           Radiology:     Radiology Results (24 Hour)     ** No results found for the last 24 hours. **          Signed by: Laretta Bolster, MD  Date/time: 03/17/20 2:33 PM

## 2020-03-18 NOTE — Nursing Progress Note (Signed)
Pt is alert and oriented x 4. Last vitals bp 147/77 HR 54. Pt requested pain medication once during the shift with positive result. Pt needs minimum assistance to the bathroom. Oral vancomycin x 3 throughout the shift. Pt stated feeling better than the previous day. Possible discharge tomorrow. All safety precaution maintained. Call within the reach. Continue on IV fluid and antibiotic. Continue to monitor

## 2020-03-18 NOTE — Plan of Care (Signed)
Patient alert and oriented x4, able to make needs known continues on IV antibiotic and PO Vanco, tolerating well, c/o having diarrhea, medicated with prn imodium, no further complaints voiced. Safety precautions maintained, call light within reach, care and comfort rounding continues.   Problem: Safety  Goal: Patient will be free from injury during hospitalization  Outcome: Progressing  Flowsheets (Taken 03/18/2020 [phone])  Patient will be free from injury during hospitalization:   Assess patient's risk for falls and implement fall prevention plan of care per policy   Provide and maintain safe environment   Include patient/ family/ care giver in decisions related to safety   Hourly rounding     Problem: Pain  Goal: Pain at adequate level as identified by patient  Outcome: Progressing  Flowsheets (Taken 03/18/2020 0611)  Pain at adequate level as identified by patient:   Identify patient comfort function goal   Evaluate if patient comfort function goal is met   Include patient/patient care companion in decisions related to pain management as needed     Problem: Infection  Goal: Free from infection  Outcome: Progressing  Flowsheets (Taken 03/18/2020 0611)  Free from infection: Assess for signs/symptoms of infection     Problem: Nutrition  Goal: Nutritional intake is adequate  Outcome: Progressing  Flowsheets (Taken 03/18/2020 0611)  Nutritional intake is adequate:   Allow adequate time for meals   Encourage/administer dietary supplements as ordered (i.e. tube feed, TPN, oral, OGT/NGT, supplements)   Encourage/perform oral hygiene as appropriate     Problem: Altered body image  Goal: Identifies positive aspects of self  Outcome: Progressing  Flowsheets (Taken 03/18/2020 0611)  Identifies positive aspects of self:   Provide emotional support   Encourage patient/patient care companion to verbalize fears, feelings, and concerns     Problem: Altered GI Function  Goal: Nutritional intake is adequate  Outcome:  Progressing  Flowsheets (Taken 03/18/2020 0611)  Nutritional intake is adequate:   Allow adequate time for meals   Encourage/administer dietary supplements as ordered (i.e. tube feed, TPN, oral, OGT/NGT, supplements)   Encourage/perform oral hygiene as appropriate  Goal: Fluid and electrolyte balance are achieved/maintained  Outcome: Progressing  Flowsheets (Taken 03/18/2020 0611)  Fluid and electrolyte balance are achieved/maintained:   Monitor/assess lab values and report abnormal values   Provide adequate hydration     Problem: Altered GI Function  Goal: Fluid and electrolyte balance are achieved/maintained  Outcome: Progressing  Flowsheets (Taken 03/18/2020 0611)  Fluid and electrolyte balance are achieved/maintained:   Monitor/assess lab values and report abnormal values   Provide adequate hydration

## 2020-03-18 NOTE — UM Notes (Signed)
03/18/20    Per Attending MD    "Today : . LOS: 8 days      Continues to have diarrhea and abdominal pain."    Plan      ".Marland KitchenMarland KitchenPatient continued to have severe abdominal pain and on the right side of the abdomen patient is extremely tender to touch and pain exacerbated by movement, abdominal x-ray does not show any free air, show colitis l repeat CT scan of the abdomen and pelvis with p.o. contrast : Showed acute colitis,  Two days later CT angio done and no ischemia or large art thrombosis seen .  -Check CBC    -For pain management I started her on tramadol for moderate pain as well as Tylenol for mild pain, patient has morphine listed at her allergy  -Appreciate GI consult and recommendation r CT angio which is  Non-ischemia   -We will need colonoscopy as outpt   - one dose of steroid given in 6/12 and she feels much better after .  -Negative for C. Difficile  - IV fluidsOngoing diarrhea  --Negative C. Difficile  --Discontinued p.o. vancomycin  --IV fluids for 24 hours  --Stool studies sent for culture ova parasite- they are all negative ..."      Ane [employer], Case Manager, [phone]

## 2020-03-18 NOTE — Progress Notes (Signed)
INTERNAL MEDICINE PROGRESS NOTE  [facility], Division of Hospitalist Medicine  [facility]  [place] pager: [phone]      Date Time: 03/18/20 6:51 PM  Patient Name: Emily [name]  Attending Physician: Laretta Bolster, MD    Assessment:   Principal Problem:    COVID-19 ruled out by laboratory testing  Resolved Problems:    * No resolved hospital problems. *    74 years old female with past medical history of breast cancer status post right breast mastectomy, history of hypertension, hyperlipidemia, hypothyroidism and chronic vertigo presented to hospital with acute onset abdominal pain secondary to acute colitis.    Today 8     Plan:     Sepsis present on admission secondary to acute colitis, fever 102.2, WBC 20,000 with left shift  CT scan of the abdomen and pelvic without contrast showed wall thickening of the cecum and ascending colon compatible with underlying colitis most likely infectious or inflammatory  - liquid diet started .  - was On levofloxacin and metronidazole- changed to Zosyn   -Elevated ESR 21 and CRP 24.4  -CEA  /wnl   -Patient continued to have severe abdominal pain and on the right side of the abdomen patient is extremely tender to touch and pain exacerbated by movement, abdominal x-ray does not show any free air, show colitis l repeat CT scan of the abdomen and pelvis with p.o. contrast : Showed acute colitis,  Two days later CT angio done and no ischemia or large art thrombosis seen .  -Check CBC    -For pain management I started her on tramadol for moderate pain as well as Tylenol for mild pain, patient has morphine listed at her allergy  -Appreciate GI consult and recommendation r CT angio which is  Non-ischemia   -We will need colonoscopy as outpt   - one dose of steroid given in 6/12 and she feels much better after .  -Negative for C. Difficile  - IV fluids     Ongoing diarrhea  --Negative C. Difficile  --Discontinued p.o. vancomycin  --IV fluids for 24 hours  --Stool  studies sent for culture ova parasite- they are all negative     Mild hyperglycemia recent hemoglobin A1c is less than 6    History of hypertension  -- lisinopril 40 mg daily  --Norvasc   --Discontinue IV fluids will help with improvement of blood pressure as well    History of hyperlipidemia on Zetia    History of breast cancer status postmastectomy    Deep vein thrombosis prophylaxis Lovenox.     Patient's abdominal pain is improved possible discharge tomorrow on oral antibiotics.    Full Code  Ensure Clear Quantity: A. One; Flavor: Special educational needs teacher; Frequency: TID (3 times a day) with meals  Gelatein Plus Quantity: A. One; Flavor: Lime; Frequency: Daily with lunch  Diet full liquid      Case discussed with: Patient, RN    Subjective/24 hour events:     Chief Complaint   Patient presents with   . Abdominal Pain   . Generalized weakness       Last 24 h no fever   Today : . LOS: 8 days      Continues to have diarrhea and abdominal pain.     Medications:   amLODIPine, 5 mg, Daily  anastrozole, 1 mg, Daily  aspirin, 81 mg, Daily  enoxaparin, 40 mg, Daily  ezetimibe, 10 mg, Daily  lactobacillus/streptococcus, 1 capsule, Daily  levothyroxine, 25 mcg, QAM  lisinopril, 40 mg, Daily  pantoprazole, 40 mg, QAM AC  piperacillin-tazobactam, 4.5 g, Q8H  rosuvastatin, 20 mg, Daily  thiamine, 100 mg, QAM  vancomycin, 125 mg, 4 times per day      Current Facility-Administered Medications   Medication Dose Route   . acetaminophen  650 mg Oral    Or   . acetaminophen  650 mg Rectal   . acetaminophen  650 mg Oral   . alum & mag hydroxide-simethicone  15 mL Oral   . dextrose  15 g of glucose Oral    And   . dextrose  12.5 g Intravenous    And   . glucagon (rDNA)  1 mg Intramuscular   . HYDROcodone-acetaminophen  1 tablet Oral   . HYDROcodone-acetaminophen  1 tablet Oral   . loperamide  2 mg Oral   . meclizine  25 mg Oral   . melatonin  3 mg Oral   . naloxone  0.2 mg Intravenous   . ondansetron  4 mg Oral    Or   . ondansetron  4 mg  Intravenous     Physical exam:   Temp:  [98.2 F (36.8 C)-99.1 F (37.3 C)] 98.2 F (36.8 C)  Heart Rate:  [49-54] 54  Resp Rate:  [16-19] 19  BP: (130-155)/(58-84) 147/77    Intake/Output Summary (Last 24 hours) at 03/18/2020 1851  Last data filed at 03/18/2020 0805  Gross per 24 hour   Intake 480 ml   Output --   Net 480 ml     General: Awake, alert, oriented, no apparent distress.  Cardiovascular: Regular rate and rhythm. No murmurs, gallops or rubs noted.  Lungs: Clear to auscultation bilaterally. No wheezing, crackles or rhonchi noted.  Abdomen: Soft, Right UQ-LQ tender, +distended. No organomegaly or masses noted. Normal bowel sounds. No guarding or rebound tenderness noted.  Extremities: No edema noted. 2+ pulses throughout.  Neuro: Non-focal neurological exam.        Labs (last 72 hours):     Recent Labs   Lab 03/16/20  1332 03/15/20  0549   WBC 13.57* 11.25*   Hgb 13.1 12.2   Hematocrit 41.5 38.8   Platelets 329 398*     Recent Labs   Lab 03/16/20  1332 03/15/20  0549   Sodium 144 144   Potassium 4.0 5.0   Chloride 106 107   CO2 23 17*   BUN 8 9   Creatinine 0.8 0.8   Calcium 9.0 8.8   Albumin  --  3.4*   Protein, Total  --  6.4   Bilirubin, Total  --  0.2   Alkaline Phosphatase  --  84   ALT  --  13   AST (SGOT)  --  21   Glucose 112* 133*           Radiology:     Radiology Results (24 Hour)     ** No results found for the last 24 hours. **          Signed by: Laretta Bolster, MD  Date/time: 03/18/20 6:51 PM

## 2020-03-18 NOTE — Progress Notes (Signed)
LOS day 8   per MD's note    Sepsis present on admission secondary to acute colitis,fever 102.2, WBC 20,000 with left shift  CT scan of the abdomen and pelvic without contrast showed wall thickening of the cecum and ascending colon compatible with underlying colitis most likely infectious or inflammatory  - liquid diet started .  - was On levofloxacin and metronidazole- changed to Zosyn   -Elevated ESR 21 and CRP 24.4  -CEA  /wnl   -Patient continued to have severe abdominal pain and on the right side of the abdomen patient is extremely tender to touch and pain exacerbated by movement, abdominal x-ray does not show any free air, show colitis l repeat CT scan of the abdomen and pelvis with p.o. contrast : Showed acute colitis,  Two days later CT angio done and no ischemia or large art thrombosis seen .  -Monitor WBC curve  -Monitor temperature  -For pain management I started her on tramadol for moderate pain as well as Tylenol for mild pain, patient has morphine listed at her allergy  -Appreciate GI consult and recommendation r CT angio which is  Non-ischemia   - Need Colonoscopy as outpt   - one dose of steroid given in 6/12 and she feels much better after .  -Negative for C. Difficile  - IV fluids     CM will coordinate care as appropriate

## 2020-03-19 LAB — STOOL ELECTROLYTES
Stool Chloride: <20 mmol/L
Stool Magnesium: 30 mg/dL
Stool Osmolality: 339
Stool Osmotic Gap: >156
Stool Phosphorus: 17 mg/dL
Stool Potassium: 45 mmol/L
Stool Sodium: <22 mmol/L

## 2020-03-19 LAB — CBC AND DIFFERENTIAL
Absolute NRBC: 0 10*3/uL (ref 0.00–0.00)
Basophils Absolute Automated: 0.03 10*3/uL (ref 0.00–0.08)
Basophils Automated: 0.3 %
Eosinophils Absolute Automated: 0.12 10*3/uL (ref 0.00–0.44)
Eosinophils Automated: 1.4 %
Hematocrit: 39.5 % (ref 34.7–43.7)
Hgb: 12.3 g/dL (ref 11.4–14.8)
Immature Granulocytes Absolute: 0.07 10*3/uL (ref 0.00–0.07)
Immature Granulocytes: 0.8 %
Lymphocytes Absolute Automated: 2.4 10*3/uL (ref 0.42–3.22)
Lymphocytes Automated: 27.4 %
MCH: 26.9 pg (ref 25.1–33.5)
MCHC: 31.1 g/dL — ABNORMAL LOW (ref 31.5–35.8)
MCV: 86.4 fL (ref 78.0–96.0)
MPV: 10.9 fL (ref 8.9–12.5)
Monocytes Absolute Automated: 0.83 10*3/uL (ref 0.21–0.85)
Monocytes: 9.5 %
Neutrophils Absolute: 5.32 10*3/uL (ref 1.10–6.33)
Neutrophils: 60.6 %
Nucleated RBC: 0 /100 WBC (ref 0.0–0.0)
Platelets: 413 10*3/uL — ABNORMAL HIGH (ref 142–346)
RBC: 4.57 10*6/uL (ref 3.90–5.10)
RDW: 13 % (ref 11–15)
WBC: 8.77 10*3/uL (ref 3.10–9.50)

## 2020-03-19 LAB — BASIC METABOLIC PANEL
Anion Gap: 10 (ref 5.0–15.0)
BUN: 3 mg/dL — ABNORMAL LOW (ref 7–19)
CO2: 32 mEq/L — ABNORMAL HIGH (ref 22–29)
Calcium: 8.6 mg/dL (ref 7.9–10.2)
Chloride: 104 mEq/L (ref 100–111)
Creatinine: 0.7 mg/dL (ref 0.6–1.0)
Glucose: 96 mg/dL (ref 70–100)
Potassium: 3.4 mEq/L — ABNORMAL LOW (ref 3.5–5.1)
Sodium: 146 mEq/L — ABNORMAL HIGH (ref 136–145)

## 2020-03-19 LAB — GFR: EGFR: >60

## 2020-03-19 MED ORDER — POTASSIUM CHLORIDE 20 MEQ PO PACK
40.00 meq | PACK | Freq: Three times a day (TID) | ORAL | Status: AC
Start: 2020-03-19 — End: 2020-03-19
  Administered 2020-03-19 (×3): 40 meq via ORAL
  Filled 2020-03-19: qty 1
  Filled 2020-03-19 (×3): qty 2

## 2020-03-19 MED ORDER — AMLODIPINE BESYLATE 5 MG PO TABS
10.0000 mg | ORAL_TABLET | Freq: Every day | ORAL | Status: DC
Start: 2020-03-20 — End: 2020-03-20
  Administered 2020-03-20: 10 mg via ORAL
  Filled 2020-03-19: qty 2

## 2020-03-19 NOTE — Progress Notes (Signed)
Pt c/o headache, Tylenol given/helpful. Assisted her to go to the bathroom X 2. Hot pack applied to lower back due to discomfort. Hourly rounding done to assess her needs. IV antibiotic given, no adverse reaction noted. Pt asleep peacefully with no abnormal findings noted. Staff will continue to monitor her

## 2020-03-19 NOTE — Progress Notes (Signed)
Nutrition Follow-up    Assessment:    Dx of acute colitis. On full liquids. 100% of breakfast consumption this morning per pt. She states that she dislikes Ensure clear but will try to consume Gelatein.     GI: 2 x diarrhea this morning - improving; c/o gas and bloating per pt.   Skin: intact per flowsheets    PMHx: HTN, HLD, Hypothyroidism, Breast cancer, GERD      Active Hospital Problems    Diagnosis   . COVID-19 ruled out by laboratory testing       Orders Placed This Encounter   Procedures   . Diet full liquid   . Ensure Clear Quantity: A. One; Flavor: Special educational needs teacher; Frequency: TID (3 times a day) with meals   . Gelatein Plus Quantity: A. One; Flavor: Lime; Frequency: Daily with lunch       Current Meds:  amLODIPine, 5 mg, Daily  anastrozole, 1 mg, Daily  aspirin, 81 mg, Daily  enoxaparin, 40 mg, Daily  ezetimibe, 10 mg, Daily  lactobacillus/streptococcus, 1 capsule, Daily  levothyroxine, 25 mcg, QAM  lisinopril, 40 mg, Daily  pantoprazole, 40 mg, QAM AC  piperacillin-tazobactam, 4.5 g, Q8H  potassium chloride, 40 mEq, TID  rosuvastatin, 20 mg, Daily  thiamine, 100 mg, QAM          Recent Labs:  Recent Labs   Lab 03/19/20  0602 03/16/20  1332 03/15/20  0549   WBC 8.77 13.57* 11.25*   Hgb 12.3 13.1 12.2   Hematocrit 39.5 41.5 38.8   MCV 86.4 85.4 87.0   Platelets 413* 329 398*     Recent Labs   Lab 03/19/20  0602 03/16/20  1332 03/15/20  0549 03/14/20  1710   Sodium 146* 144 144 143   Potassium 3.4* 4.0 5.0 3.8   Chloride 104 106 107 107   CO2 32* 23 17* 22   BUN 3* 8 9 5*   Creatinine 0.7 0.8 0.8 0.7   Glucose 96 112* 133* 86   Calcium 8.6 9.0 8.8 8.5   EGFR >60.0 >60.0 >60.0 >60.0     Recent Labs   Lab 03/15/20  0549   Albumin 3.4*         Intake/Output Summary (Last 24 hours) at 03/19/2020 0956  Last data filed at 03/18/2020 1332  Gross per 24 hour   Intake 240 ml   Output --   Net 240 ml       Anthropometrics  Height: 160 cm (5' 2.99")  Weight: 71.2 kg (156 lb 15.5 oz)  Weight Change: 0  IBW/kg (Calculated)  Female: 56.35 kg  IBW/kg (Calculated) Female: 52.25 kg  BMI (calculated): 27.9    Estimated Nutrition Needs:  Estimated Energy Needs  Total Energy Estimated Needs: 1420 - 1775 Kcal  Method for Estimating Needs: 20 - 25 Kcal/Kg    Estimated Protein Needs  Total Protein Estimated Needs: 85 - 107 g  Method for Estimating Needs: 1.2 - 1.5 g/Kg    Fluid Needs  Total Fluid Estimated Needs: 1420 - 1775 ml or as needed  Method for Estimating Needs: 1 ml/Kcal      Learning & Discharge Planning Needs: None at this time.   Religious/Cultural Food Practices: No    Nutrition Diagnosis:   Inadequate oral intake related to medical condition such as colitis and malabsorption due to ongoing diarrhea as evidenced by < 50% of estimated needs x 6 days since this admission (03/10/20). Progressing.     Intervention:  1.) Continue with full liquids and Gelatein x 1    2.) Discontinue Ensure clear TID     Goals:  1.) Pt will meet > 75% of estimated needs via oral intake.     M/E:  Monitor PO/ONS intake/tolerance, weight, labs, GI issues (diarrhea) and skin integrity.  Follow up within 7-10 days.  Makynzee Tigges Kipp Brood, RD x [phone]

## 2020-03-19 NOTE — Progress Notes (Signed)
Pt IV 24 gauge LFA is removed, no infiltrate.

## 2020-03-19 NOTE — Plan of Care (Signed)
Problem: Safety  Goal: Patient will be free from injury during hospitalization  Outcome: Progressing  Flowsheets (Taken 03/19/2020 1545)  Patient will be free from injury during hospitalization:   Assess patient's risk for falls and implement fall prevention plan of care per policy   Provide and maintain safe environment   Use appropriate transfer methods   Ensure appropriate safety devices are available at the bedside   Hourly rounding     Problem: Pain  Goal: Pain at adequate level as identified by patient  Outcome: Progressing  Flowsheets (Taken 03/19/2020 1545)  Pain at adequate level as identified by patient:   Identify patient comfort function goal   Assess for risk of opioid induced respiratory depression, including snoring/sleep apnea. Alert healthcare team of risk factors identified.   Assess pain on admission, during daily assessment and/or before any "as needed" intervention(s)   Reassess pain within 30-60 minutes of any procedure/intervention, per Pain Assessment, Intervention, Reassessment (AIR) Cycle   Evaluate if patient comfort function goal is met   Offer non-pharmacological pain management interventions   Evaluate patient's satisfaction with pain management progress     Problem: Infection  Goal: Free from infection  Outcome: Progressing  Flowsheets (Taken 03/19/2020 1545)  Free from infection:   Assess for signs/symptoms of infection   Utilize isolation precautions per protocol/policy     Problem: Nutrition  Goal: Nutritional intake is adequate  Outcome: Progressing  Flowsheets (Taken 03/19/2020 1545)  Nutritional intake is adequate:   Assist patient with meals/food selection   Allow adequate time for meals   Encourage/perform oral hygiene as appropriate     Problem: Altered body image  Goal: Identifies positive aspects of self  Outcome: Progressing  Flowsheets (Taken 03/19/2020 1545)  Identifies positive aspects of self:   Educate patient about body part identification   Provide emotional support      Problem: Altered GI Function  Goal: Nutritional intake is adequate  Outcome: Progressing  Flowsheets (Taken 03/19/2020 1545)  Nutritional intake is adequate:   Assist patient with meals/food selection   Allow adequate time for meals   Encourage/perform oral hygiene as appropriate

## 2020-03-19 NOTE — Progress Notes (Signed)
Chaplain Service      Assessment:       Background:  Visit Type: Initial was made by Chaplain with patient, Emily [name], based on Source: Chaplain Initiated.  Present at Visit: patient.  Spiritual Care Provided to: patient only.  Length of Visit: 0-15 minutes .    Summary:          Spiritual Care Outcomes: Patient expressed appreciation of visit

## 2020-03-19 NOTE — Progress Notes (Addendum)
INTERNAL MEDICINE PROGRESS NOTE  [facility], Division of Hospitalist Medicine  [facility]  [place] pager: [phone]      Date Time: 03/19/20 3:51 PM  Patient Name: Emily [name]  Attending Physician: Theresia Lo, MD    Assessment:   Principal Problem:    COVID-19 ruled out by laboratory testing  Resolved Problems:    * No resolved hospital problems. *    74 years old female with past medical history of breast cancer status post right breast mastectomy, history of hypertension, hyperlipidemia, hypothyroidism and chronic vertigo presented to hospital with acute onset abdominal pain secondary to acute colitis.    Today day  9     Plan:     Sepsis present on admission secondary to acute colitis, fever 102.2, WBC 20,000 with left shift  CT scan of the abdomen and pelvic without contrast showed wall thickening of the cecum and ascending colon compatible with underlying colitis most likely infectious or inflammatory  -[place] antibiotic  -Elevated ESR 21 and CRP 24.4  -CEA  /wnl   -Patient continued to have severe abdominal pain and on the right side of the abdomen patient is extremely tender to touch and pain exacerbated by movement, abdominal x-ray does not show any free air, show colitis l repeat CT scan of the abdomen and pelvis with p.o. contrast : Showed acute colitis,  Two days later CT angio done and no ischemia or large art thrombosis seen .  -= Advance diet    -For pain management I started her on tramadol for moderate pain as well as Tylenol for mild pain, patient has morphine listed at her allergy  -Appreciate GI consult and recommendation r CT angio which is  Non-ischemia   -We will need colonoscopy as outpt   - one dose of steroid given in 6/12 and she feels much better after .      Ongoing diarrhea  --Negative C. Difficile  --Discontinued p.o. vancomycin  --[place] IVF   --Stool studies sent for culture ova parasite- they are all negative   -Loperamide as needed    Mild hyperglycemia recent  hemoglobin A1c is less than 6    History of hypertension  -- lisinopril 40 mg daily  --Norvasc     History of hyperlipidemia on Zetia    History of breast cancer status postmastectomy    Hypokalemia can be secondary to diarrhea being replaced p.o.    Mild hypernatremia can be secondary to free water and diarrhea    Deep vein thrombosis prophylaxis Lovenox.     Patient's abdominal pain is improved possible discharge tomorrow on oral antibiotics.    Full Code  Gelatein Plus Quantity: A. One; Flavor: Lime; Frequency: Daily with lunch  Diet full liquid      Case discussed with: Patient, RN    Subjective/24 hour events:     Chief Complaint   Patient presents with   . Abdominal Pain   . Generalized weakness       Last 24 h no fever   Today : . LOS: 9 days  Patient is doing better today abdominal pain significantly improved but she is a still have tenderness in right lower quadrant, denies any nausea or epigastric pain but continues to have diarrhea today she had 2 loose bowel movement nonbloody.         Medications:   [START ON 03/20/2020] amLODIPine, 10 mg, Daily  anastrozole, 1 mg, Daily  aspirin, 81 mg, Daily  enoxaparin, 40  mg, Daily  ezetimibe, 10 mg, Daily  lactobacillus/streptococcus, 1 capsule, Daily  levothyroxine, 25 mcg, QAM  lisinopril, 40 mg, Daily  pantoprazole, 40 mg, QAM AC  potassium chloride, 40 mEq, TID  rosuvastatin, 20 mg, Daily  thiamine, 100 mg, QAM      Current Facility-Administered Medications   Medication Dose Route   . acetaminophen  650 mg Oral    Or   . acetaminophen  650 mg Rectal   . acetaminophen  650 mg Oral   . alum & mag hydroxide-simethicone  15 mL Oral   . dextrose  15 g of glucose Oral    And   . dextrose  12.5 g Intravenous    And   . glucagon (rDNA)  1 mg Intramuscular   . HYDROcodone-acetaminophen  1 tablet Oral   . HYDROcodone-acetaminophen  1 tablet Oral   . loperamide  2 mg Oral   . meclizine  25 mg Oral   . melatonin  3 mg Oral   . naloxone  0.2 mg Intravenous   . ondansetron   4 mg Oral    Or   . ondansetron  4 mg Intravenous     Physical exam:   Temp:  [97.9 F (36.6 C)-99 F (37.2 C)] 98.7 F (37.1 C)  Heart Rate:  [51-60] 60  Resp Rate:  [16-18] 17  BP: (127-157)/(57-74) 127/70  No intake or output data in the 24 hours ending 03/19/20 1551  General: Awake, alert, oriented, no apparent distress.  Cardiovascular: Regular rate and rhythm. No murmurs, gallops or rubs noted.  Lungs: Clear to auscultation bilaterally. No wheezing, crackles or rhonchi noted.  Abdomen: Soft, Right-LQ tender, +distended. No organomegaly or masses noted. Normal bowel sounds. No guarding or rebound tenderness noted.  Extremities: No edema noted. 2+ pulses throughout.  Neuro: Non-focal neurological exam.        Labs (last 72 hours):     Recent Labs   Lab 03/19/20  0602 03/16/20  1332   WBC 8.77 13.57*   Hgb 12.3 13.1   Hematocrit 39.5 41.5   Platelets 413* 329     Recent Labs   Lab 03/19/20  0602 03/16/20  1332 03/15/20  0549 03/15/20  0549   Sodium 146* 144  More results in Results Review 144   Potassium 3.4* 4.0  More results in Results Review 5.0   Chloride 104 106  More results in Results Review 107   CO2 32* 23  More results in Results Review 17*   BUN 3* 8  More results in Results Review 9   Creatinine 0.7 0.8  More results in Results Review 0.8   Calcium 8.6 9.0  More results in Results Review 8.8   Albumin  --   --   --  3.4*   Protein, Total  --   --   --  6.4   Bilirubin, Total  --   --   --  0.2   Alkaline Phosphatase  --   --   --  84   ALT  --   --   --  13   AST (SGOT)  --   --   --  21   Glucose 96 112*  More results in Results Review 133*   More results in Results Review = values in this interval not displayed.           Radiology:     Radiology Results (24 Hour)     ** No results  found for the last 24 hours. **          Signed by: Theresia Lo, MD  Date/time: 03/19/20 3:51 PM

## 2020-03-19 NOTE — Nursing Progress Note (Signed)
Pt is alert and oriented x4. Pt complained of pain. 650 mg of tylenol given. Pt is still feeling a small pain on the right side of the abdomen. diarrhea still present. Continue to monitor. Antibiotic is D/C. All safety protocol maintained. Possible discharge tomorrow.update given to the pt by the doctor.

## 2020-03-20 LAB — GFR: EGFR: >60

## 2020-03-20 LAB — BASIC METABOLIC PANEL
Anion Gap: 9 (ref 5.0–15.0)
BUN: 4 mg/dL — ABNORMAL LOW (ref 7–19)
CO2: 27 mEq/L (ref 22–29)
Calcium: 8.9 mg/dL (ref 7.9–10.2)
Chloride: 103 mEq/L (ref 100–111)
Creatinine: 0.7 mg/dL (ref 0.6–1.0)
Glucose: 131 mg/dL — ABNORMAL HIGH (ref 70–100)
Potassium: 4.2 mEq/L (ref 3.5–5.1)
Sodium: 139 mEq/L (ref 136–145)

## 2020-03-20 LAB — GLUCOSE WHOLE BLOOD - POCT: Whole Blood Glucose POCT: 88 mg/dL (ref 70–100)

## 2020-03-20 MED ORDER — LOPERAMIDE HCL 2 MG PO CAPS
2.00 mg | ORAL_CAPSULE | Freq: Four times a day (QID) | ORAL | 0 refills | Status: DC | PRN
Start: 2020-03-20 — End: 2020-09-28

## 2020-03-20 MED ORDER — RISAQUAD PO CAPS
1.00 | ORAL_CAPSULE | Freq: Every day | ORAL | 0 refills | Status: DC
Start: 2020-03-21 — End: 2021-06-01

## 2020-03-20 MED ORDER — AMLODIPINE BESYLATE 10 MG PO TABS
10.0000 mg | ORAL_TABLET | Freq: Every day | ORAL | 0 refills | Status: DC
Start: 2020-03-21 — End: 2020-09-28

## 2020-03-20 NOTE — Progress Notes (Signed)
Patient is alert and oriented.  Reported at least 6-7 episodes of diarrhea during day yesterday.  Administered po imodium per order and explained to patient that she can have this up to four times each day per the order, and that when she is discharged, this cn be purchased over the counter.  Patient reports that in the past, she has used paregoric with good effects.  I explained that paregoric is a prescription medication now. She has used this in [place] per her report.    Patient also given melatonin at hs per her request.  Slept well through the night, but this morning, she reports diarrhea x 3 already this morning.  Also, educated on the use of probiotics.

## 2020-03-20 NOTE — Progress Notes (Signed)
PATIENT WITH  ORDERS- CM MET WITH PATIENT @ [facility]. SHE INFORMS THIS CM " I LIVE ALONE BUT I HAVE FAMILY AND FRIENDS AROUND ME". DECLINED OFFER FOR [facility]HA SERVICES, CM ADVISED HER ON IMPORTANCE OF FOLLOW UP. VERBALIZED UNDERSTANDING   03/20/20 1244   Discharge Disposition   Physical Discharge Disposition Home   Receiving facility, unit and room number: NA   Nursing report phone number: NA   Facility fax number: NA   Mode of Transportation Car   Patient/Family/POA notified of transfer plan Yes   Patient agreeable to discharge plan/expected d/c date? Yes   Family/POA agreeable to discharge plan/expected d/c date? Yes   Bedside nurse notified of transport plan? Yes   CM Interventions   Follow up appointment scheduled?   (PATIENT WILL SCHEDULE SHE SAYS)   Multidisciplinary rounds/family meeting before d/c? Yes   Medicare Checklist   Is this a Medicare patient? Yes   Patient received 1st IMM Letter? Yes   If LOS 3 days or greater, did patient received 2nd IMM Letter? Yes   Sharene Butters RN/CCM

## 2020-03-20 NOTE — Discharge Instr - AVS First Page (Addendum)
Reason for your Hospital Admission:   Colitis ( inflammation / Infection of the colon )       Instructions for after your discharge:    You finish the antibiotics course in hospital , you no longer need to take antibiotics .   However you need to get a follow up with GI Doctor Dr Roda Shutters , to get the colonoscopy so they can take a close look inside your colon and identified exact reason for this inflammation.     If you have pain take Tylenol 650 mg 3 times a day as needed .

## 2020-03-22 LAB — STOOL OVA AND PARASITE SMEAR

## 2020-03-23 ENCOUNTER — Telehealth (INDEPENDENT_AMBULATORY_CARE_PROVIDER_SITE_OTHER): Payer: Self-pay | Admitting: Internal Medicine

## 2020-03-23 NOTE — Telephone Encounter (Signed)
LVm for patient to call back to schedule a fu appt.

## 2020-03-24 NOTE — Discharge Summary (Signed)
Discharge Summary    Date:03/25/2020   Patient Name: Emily [name], Emily [name]  Attending Physician: No att. providers found    Date of Admission:   03/10/2020    Date of Discharge:   03/25/2020     Admitting Diagnosis:   Acute abd pain   Acute colitis   Sepsis Present on admission    History of hypertension  History of hyperlipidemia  History of breast cancer status post mastectomy        Discharge Dx:     Principal Diagnosis (Diagnosis after study, that is chiefly responsible for admission to inpatient status): COVID-19 ruled out by laboratory testing  Active Hospital Problems    Diagnosis POA   . Principal Problem: COVID-19 ruled out by laboratory testing Not Applicable   . Acute colitis Unknown      Resolved Hospital Problems   No resolved problems to display.           Procedures performed:   Radiology: all results from this admission  CT Abd/Pelvis without Contrast    Result Date: 03/10/2020    1. Examination limited by lack of oral and IV contrast. 2. Wall thickening of the cecum and ascending colon compatible with underlying colitis, most likely infectious or inflammatory. Correlate clinically. 3. Additional chronic findings detailed above. Mitali Bapna, MD  03/10/2020 1:33 PM    XR Abdomen 2 Views    Result Date: 03/11/2020   Findings compatible with nonspecific segmental colitis although other etiologies of bowel wall thickening such as neoplasia cannot be excluded.. No obstruction Laurena Slimmer, MD  03/11/2020 11:19 AM    CT Chest with Contrast    Result Date: 03/08/2020  1. No acute abnormality identified 2. Stable chronic findings as above.   Wyatt Portela, MD  03/08/2020 6:14 PM    CT Angiogram Abdomen Pelvis    Result Date: 03/14/2020  1. Multifocal colonic wall thickening in terminal ileal thickening compatible with a nonspecific enterocolitis 2. No visualized thrombosis or high-grade stenosis of the SMA or celiac artery or IMA or visible distal branches. Note that CT would not be sensitive for detection of distal embolic  phenomena or small vessel vasculitis or ischemic changes due to prior hypotensive event , 3. Nonspecific gallbladder wall thickening and gallbladder wall edema. Laurena Slimmer, MD  03/14/2020 7:35 PM    US Abdomen Limited RUQ    Result Date: 03/10/2020  1. No evidence of cholelithiasis. Normal common duct. 2. Simple right renal cysts. No hydronephrosis. Kinnie Feil, MD  03/10/2020 3:54 PM    CT Abdomen Pelvis W IV And PO Cont    Result Date: 03/12/2020    1. Wall thickening of the cecum and ascending colon compatible with colitis. Correlate clinically for infectious or inflammatory etiologies. 2. Small left pleural effusion is new. 3. Chronic findings detailed above.  Mitali Bapna, MD  03/12/2020 3:36 PM        Hospital Course:     Sepsis present on admission secondary to acute colitis,fever 102.2, WBC 20,000 with left shift    74 years old female with past medical history of breast cancer status post right breast mastectomy, history of hypertension, hyperlipidemia, hypothyroidism and chronic vertigo presented to hospital with acute onset abdominal pain secondary to acute colitis.    CT scan of the abdomen and pelvic without contrast showed wall thickening of the cecum and ascending colon compatible with underlying colitis most likely infectious or inflammatory .  Patient is started on intravenous antibiotic, WBC downtrend however patient continued  to have severe abdominal pain the right side of the abdomen patient was extremely tender to touch and pain exacerbated by movement, abdominal x-ray does not show any free air, show colitis- CT scan of the abdomen and pelvis with p.o. contrast.  After 2 days and: Showed acute colitis, intravenous antibiotic continued and GI consulted, patient continued to have abdominal pain  Two days later CT angio done and no ischemia or large artery  thrombosis seen .  Patient had elevated ESR 21 and CRP 24.4 .CEA  /wnl.  one dose of steroid given in 6/12 and she feels much better after  Finally on day 6 patient was able to tolerate diet and abdominal pain subsided, GI and surgery was following the patient.  Diet advanced and patient tolerated diet,patient is now medically stable to be discharged    Ongoing diarrhea  --She did not have diarrhea on admission however 3 days after admission she started to have loose bowel movement and on 2 occasion she is seen bright blood .stool sent for C. difficile and it was negative Stool studies sent for culture ova parasite- they are all negative   -Loperamide as needed administered.  -Patient understood that she needs to have a follow-up with GI to get colonoscopy as outpatient as she might have IBD    Mild hyperglycemia recent hemoglobin A1c is less than 6    History of hypertension  -- lisinopril 40 mg daily , Norvasc added    History of hyperlipidemia on Zetia    History of breast cancer status postmastectomy    Hypokalemia can be secondary to diarrhea being replaced p.o.    Mild hypernatremia can be secondary to free water and diarrhea      Disposition Home  Plan of care discussed with her daughter and son on the phone      Condition at Discharge:   Stable  Today:     BP 148/67   Pulse (!) 54   Temp 97.9 F (36.6 C) (Oral)   Resp 17   Ht 1.6 m (5' 2.99")   Wt 71.2 kg (156 lb 15.5 oz)   SpO2 97%   BMI 27.81 kg/m   Ranges for the last 24 hours:       Last set of labs   Recent Labs   Lab 03/19/20  0602   WBC 8.77   Hgb 12.3   Hematocrit 39.5   Platelets 413*     Recent Labs   Lab 03/20/20  0958   Sodium 139   Potassium 4.2   Chloride 103   CO2 27   BUN 4*   Creatinine 0.7   EGFR >60.0   Glucose 131*   Calcium 8.9       Micro / Labs / Path pending:     Unresulted Labs     Procedure . . . Date/Time    Stool Ova and Parasite Smear [[id number]] Collected: 03/14/20 2356     Updated: 03/15/20 0005    Narrative:      Contact laboratory for specimen collection requirements & for requirements for  sources other than feces.          Discharge  Instructions For Providers     1.              Discharge Instructions:     Follow-up Information     Dennemeyer, Dema Severin, MD Follow up in 1 week(s).    Specialty: Family Medicine  Contact information:  [phone] [facility]  [address]  [place] [address]  [phone]             Einar Grad, MD Follow up in 1 week(s).    Specialties: Gastroenterology, Internal Medicine  Contact information:  [address]  [place] [address]  [phone]                   Discharge Diet: Regular Diet        Extended Emergency Contact Information  Primary Emergency Contact: Neal,Diana   [place]  Home Phone: [phone]  Mobile Phone: [phone]  Relation: Daughter  Secondary Emergency Contact: Neal,Stephen  [place]  Mobile Phone: [phone]  Relation: Son  Disposition:  Home or Self Care     Discharge Medication List      Taking    amLODIPine 10 MG tablet  Dose: 10 mg  Commonly known as: NORVASC  Take 1 tablet (10 mg total) by mouth daily     anastrozole 1 MG tablet  Dose: 1 mg  What changed: when to take this  Commonly known as: ARIMIDEX  For: Early Cancer of the Breast in Postmenopausal Women  Take 1 tablet (1 mg total) by mouth daily     aspirin 81 MG chewable tablet  Dose: 81 mg  What changed: Another medication with the same name was removed. Continue taking this medication, and follow the directions you see here.  Chew 1 tablet (81 mg total) by mouth daily     ezetimibe 10 MG tablet  Dose: 10 mg  Commonly known as: ZETIA  Take 1 tablet (10 mg total) by mouth daily     lactobacillus/streptococcus Caps  Dose: 1 capsule  Take 1 capsule by mouth daily     levothyroxine 25 MCG tablet  Dose: 25 mcg  Commonly known as: SYNTHROID  Take 1 tablet (25 mcg total) by mouth every morning     lisinopril 20 MG tablet  Dose: 20 mg  What changed:    how much to take   when to take this  Commonly known as: ZESTRIL  Take 1 tablet (20 mg total) by mouth 2 (two) times daily     loperamide 2 MG  capsule  Dose: 2 mg  Commonly known as: IMODIUM  Take 1 capsule (2 mg total) by mouth 4 (four) times daily as needed for Diarrhea     meclizine 25 MG tablet  Dose: 25 mg  Commonly known as: ANTIVERT  For: Sensation of Spinning or Whirling  Take 1 tablet (25 mg total) by mouth 3 (three) times daily as needed for Dizziness     multivitamin with minerals tablet  Dose: 1 tablet  Take 1 tablet by mouth every morning       pantoprazole 40 MG tablet  Dose: 40 mg  Commonly known as: PROTONIX  Take 1 tablet (40 mg total) by mouth every morning before breakfast     rosuvastatin 40 MG tablet  Dose: 20 mg  Commonly known as: CRESTOR  Take 0.5 tablets (20 mg total) by mouth daily     vitamin B-1 100 MG tablet  Dose: 100 mg  Commonly known as: THIAMINE  Take 100 mg by mouth every morning.        STOP taking these medications    ibuprofen 600 MG tablet  Commonly known as: ADVIL          Minutes spent coordinating discharge and reviewing discharge plan:  40 minutes      Signed by: Theresia Lo, MD  CC Dennemeyer, Dema Severin, MD

## 2020-03-25 DIAGNOSIS — K529 Noninfective gastroenteritis and colitis, unspecified: Secondary | ICD-10-CM

## 2020-03-26 ENCOUNTER — Ambulatory Visit (INDEPENDENT_AMBULATORY_CARE_PROVIDER_SITE_OTHER): Payer: Medicare Other | Admitting: Internal Medicine

## 2020-03-30 ENCOUNTER — Other Ambulatory Visit: Payer: Self-pay | Admitting: Hematology & Oncology

## 2020-03-30 DIAGNOSIS — C50919 Malignant neoplasm of unspecified site of unspecified female breast: Secondary | ICD-10-CM

## 2020-04-03 ENCOUNTER — Other Ambulatory Visit: Payer: Self-pay | Admitting: Family Medicine

## 2020-04-03 ENCOUNTER — Ambulatory Visit
Admission: RE | Admit: 2020-04-03 | Discharge: 2020-04-03 | Disposition: A | Discharge status: Home / Self Care | Payer: Medicare Other | Source: Ambulatory Visit | Attending: Family Medicine | Admitting: Family Medicine

## 2020-04-03 DIAGNOSIS — Z853 Personal history of malignant neoplasm of breast: Secondary | ICD-10-CM

## 2020-04-13 ENCOUNTER — Ambulatory Visit (INDEPENDENT_AMBULATORY_CARE_PROVIDER_SITE_OTHER): Payer: Medicare Other | Admitting: Cardiovascular Disease

## 2020-04-29 ENCOUNTER — Telehealth (INDEPENDENT_AMBULATORY_CARE_PROVIDER_SITE_OTHER): Payer: Self-pay | Admitting: Cardiology

## 2020-04-29 ENCOUNTER — Ambulatory Visit: Payer: Medicare Other

## 2020-04-29 DIAGNOSIS — I10 Essential (primary) hypertension: Secondary | ICD-10-CM

## 2020-04-29 DIAGNOSIS — R1013 Epigastric pain: Secondary | ICD-10-CM

## 2020-04-29 NOTE — Telephone Encounter (Signed)
Patient called requesting a rx refill  Lisinopril #90    Pharmacy [facility] [phone]    [facility]  [facility]

## 2020-04-30 ENCOUNTER — Other Ambulatory Visit (INDEPENDENT_AMBULATORY_CARE_PROVIDER_SITE_OTHER): Payer: Self-pay | Admitting: Cardiology

## 2020-04-30 DIAGNOSIS — E782 Mixed hyperlipidemia: Secondary | ICD-10-CM

## 2020-04-30 DIAGNOSIS — R1013 Epigastric pain: Secondary | ICD-10-CM

## 2020-04-30 DIAGNOSIS — I10 Essential (primary) hypertension: Secondary | ICD-10-CM

## 2020-04-30 MED ORDER — LISINOPRIL 40 MG PO TABS
40.0000 mg | ORAL_TABLET | Freq: Every day | ORAL | 3 refills | Status: DC
Start: 2020-04-30 — End: 2020-09-28

## 2020-05-02 ENCOUNTER — Other Ambulatory Visit (INDEPENDENT_AMBULATORY_CARE_PROVIDER_SITE_OTHER): Payer: Self-pay | Admitting: Cardiology

## 2020-05-02 DIAGNOSIS — I10 Essential (primary) hypertension: Secondary | ICD-10-CM

## 2020-05-10 ENCOUNTER — Emergency Department: Payer: Medicare Other

## 2020-05-10 ENCOUNTER — Emergency Department
Admission: EM | Admit: 2020-05-10 | Discharge: 2020-05-10 | Disposition: A | Discharge status: Home / Self Care | Payer: Medicare Other | Attending: Emergency Medicine | Admitting: Emergency Medicine

## 2020-05-10 DIAGNOSIS — I10 Essential (primary) hypertension: Secondary | ICD-10-CM | POA: Insufficient documentation

## 2020-05-10 DIAGNOSIS — R0789 Other chest pain: Secondary | ICD-10-CM | POA: Insufficient documentation

## 2020-05-10 DIAGNOSIS — K219 Gastro-esophageal reflux disease without esophagitis: Secondary | ICD-10-CM | POA: Insufficient documentation

## 2020-05-10 DIAGNOSIS — Z7982 Long term (current) use of aspirin: Secondary | ICD-10-CM | POA: Insufficient documentation

## 2020-05-10 DIAGNOSIS — R5383 Other fatigue: Secondary | ICD-10-CM | POA: Insufficient documentation

## 2020-05-10 DIAGNOSIS — E039 Hypothyroidism, unspecified: Secondary | ICD-10-CM | POA: Insufficient documentation

## 2020-05-10 DIAGNOSIS — L299 Pruritus, unspecified: Secondary | ICD-10-CM | POA: Insufficient documentation

## 2020-05-10 DIAGNOSIS — E785 Hyperlipidemia, unspecified: Secondary | ICD-10-CM | POA: Insufficient documentation

## 2020-05-10 LAB — ECG 12-LEAD
Atrial Rate: 73 {beats}/min
Atrial Rate: 75 {beats}/min
P Axis: 56 degrees
P Axis: 59 degrees
P-R Interval: 176 ms
P-R Interval: 176 ms
Q-T Interval: 344 ms
Q-T Interval: 346 ms
QRS Duration: 70 ms
QRS Duration: 72 ms
QTC Calculation (Bezet): 378 ms
QTC Calculation (Bezet): 386 ms
R Axis: 43 degrees
R Axis: 43 degrees
T Axis: -20 degrees
T Axis: -27 degrees
Ventricular Rate: 73 {beats}/min
Ventricular Rate: 75 {beats}/min

## 2020-05-10 LAB — URINALYSIS REFLEX TO MICROSCOPIC EXAM - REFLEX TO CULTURE
Bilirubin, UA: NEGATIVE
Blood, UA: NEGATIVE
Glucose, UA: NEGATIVE
Ketones UA: NEGATIVE
Leukocyte Esterase, UA: NEGATIVE
Nitrite, UA: NEGATIVE
Protein, UR: NEGATIVE
Specific Gravity UA: 1.006 (ref 1.001–1.035)
Urine pH: 7 (ref 5.0–8.0)
Urobilinogen, UA: NEGATIVE mg/dL (ref 0.2–2.0)

## 2020-05-10 LAB — COMPREHENSIVE METABOLIC PANEL
ALT: 11 U/L (ref 0–55)
AST (SGOT): 22 U/L (ref 5–34)
Albumin/Globulin Ratio: 1.4 (ref 0.9–2.2)
Albumin: 4.3 g/dL (ref 3.5–5.0)
Alkaline Phosphatase: 83 U/L (ref 37–106)
Anion Gap: 14 (ref 5.0–15.0)
BUN: 14 mg/dL (ref 7–19)
Bilirubin, Total: 0.2 mg/dL (ref 0.2–1.2)
CO2: 24 mEq/L (ref 22–29)
Calcium: 9.7 mg/dL (ref 7.9–10.2)
Chloride: 105 mEq/L (ref 100–111)
Creatinine: 0.8 mg/dL (ref 0.6–1.0)
Globulin: 3 g/dL (ref 2.0–3.6)
Glucose: 98 mg/dL (ref 70–100)
Potassium: 4.1 mEq/L (ref 3.5–5.1)
Protein, Total: 7.3 g/dL (ref 6.0–8.3)
Sodium: 143 mEq/L (ref 136–145)

## 2020-05-10 LAB — CBC AND DIFFERENTIAL
Absolute NRBC: 0 10*3/uL (ref 0.00–0.00)
Basophils Absolute Automated: 0.04 10*3/uL (ref 0.00–0.08)
Basophils Automated: 0.6 %
Eosinophils Absolute Automated: 0.11 10*3/uL (ref 0.00–0.44)
Eosinophils Automated: 1.6 %
Hematocrit: 38 % (ref 34.7–43.7)
Hgb: 11.5 g/dL (ref 11.4–14.8)
Immature Granulocytes Absolute: 0.02 10*3/uL (ref 0.00–0.07)
Immature Granulocytes: 0.3 %
Lymphocytes Absolute Automated: 1.79 10*3/uL (ref 0.42–3.22)
Lymphocytes Automated: 25.9 %
MCH: 26.6 pg (ref 25.1–33.5)
MCHC: 30.3 g/dL — ABNORMAL LOW (ref 31.5–35.8)
MCV: 87.8 fL (ref 78.0–96.0)
MPV: 11.7 fL (ref 8.9–12.5)
Monocytes Absolute Automated: 0.41 10*3/uL (ref 0.21–0.85)
Monocytes: 5.9 %
Neutrophils Absolute: 4.53 10*3/uL (ref 1.10–6.33)
Neutrophils: 65.7 %
Nucleated RBC: 0 /100 WBC (ref 0.0–0.0)
Platelets: 306 10*3/uL (ref 142–346)
RBC: 4.33 10*6/uL (ref 3.90–5.10)
RDW: 14 % (ref 11–15)
WBC: 6.9 10*3/uL (ref 3.10–9.50)

## 2020-05-10 LAB — GFR: EGFR: >60

## 2020-05-10 LAB — TROPONIN I: Troponin I: <0.01 ng/mL (ref 0.00–0.05)

## 2020-05-10 LAB — IHS D-DIMER: D-Dimer: 0.93 ug/mL FEU — ABNORMAL HIGH (ref 0.00–0.60)

## 2020-05-10 MED ORDER — ACETAMINOPHEN 500 MG PO TABS
1000.0000 mg | ORAL_TABLET | Freq: Once | ORAL | Status: AC
Start: 2020-05-10 — End: 2020-05-10
  Administered 2020-05-10: 1000 mg via ORAL
  Filled 2020-05-10: qty 2

## 2020-05-10 MED ORDER — DESONIDE 0.05 % EX OINT
TOPICAL_OINTMENT | Freq: Two times a day (BID) | CUTANEOUS | 0 refills | Status: DC
Start: 2020-05-10 — End: 2022-05-12

## 2020-05-10 MED ORDER — IOHEXOL 350 MG/ML IV SOLN
100.0000 mL | Freq: Once | INTRAVENOUS | Status: AC | PRN
Start: 2020-05-10 — End: 2020-05-10
  Administered 2020-05-10: 100 mL via INTRAVENOUS

## 2020-05-10 NOTE — EDIE (Signed)
COLLECTIVE?NOTIFICATION?05/10/2020 10:52?Abbygael, Curtiss B?MRN: [id number]    [place] [facility]'s patient encounter information:   VWU:?[id number]  Account [id number]  Billing Account [id number]      Criteria Met      5 ED Visits in 12 Months    Security and Safety  No recent Security Events currently on file    ED Care Guidelines  There are currently no ED Care Guidelines for this patient. Please check your facility's medical records system.        Prescription Monitoring Program  000??- Narcotic Use Score  000??- Sedative Use Score  000??- Stimulant Use Score  000??- Overdose Risk Score  - All Scores range from 000-999 with 75% of the population scoring < 200 and on 1% scoring above 650  - The last digit of the narcotic, sedative, and stimulant score indicates the number of active prescriptions of that type  - Higher Use scores correlate with increased prescribers, pharmacies, mg equiv, and overlapping prescriptions  - Higher Overdose Risk Scores correlate with increased risk of unintentional overdose death   Concerning or unexpectedly high scores should prompt a review of the PMP record; this does not constitute checking PMP for prescribing purposes.      E.D. Visit Count (12 mo.)  Facility Visits   [place] [facility] 6   Total 6   Note: Visits indicate total known visits.     Recent Emergency Department Visit Summary  Date Facility [facility] Type Diagnoses or Chief Complaint   May 10, 2020 [place] [facility]      weakness. lump by lt ear      Mar 10, 2020 [place] [facility]      abd pain      Generalized weakness      Abdominal Pain      Contact with and (suspected) exposure to covid-19      Mar 08, 2020 [place] [facility] [place] [facility]      fatige,      fatige, weakness      Breast Pain      Other chest pain      Chest pain, unspecified      Other specified anxiety disorders      Acquired absence of right breast and  nipple      Jan 27, 2020 [place] [facility]      possible stroke      Numbness      Dec 28, 2019 [place] [facility]      both breast pain      Chest Pain      Mastodynia      Secondary hypertension, unspecified      Aug 08, 2019 [place] [facility]      fall [id number]/facial injuries      Fall      Contusion of other part of head, initial encounter      Cervicalgia      Pleurodynia      Fall (on) (from) other stairs and steps, initial encounter      Pain in right shoulder          Recent Inpatient Visit Summary  Date Facility [facility] Type Diagnoses or Chief Complaint   Mar 10, 2020  [facility] [place] [facility]      Contact  with and (suspected) exposure to covid-19      Noninfective gastroenteritis and colitis, unspecified      Jan 27, 2020 [place] [facility]      Weakness          Care Team  Provider Specialty Phone Fax Service Dates   DAVIDSON, Janet Berlin MD M, MD Family Medicine: Adult Medicine [phone] Current      Collective Portal  This patient has registered at the [facility] Emergency Department   For more information visit: [email or web address]     PLEASE NOTE:     1.   Any care recommendations and other clinical information are provided as guidelines or for historical purposes only, and providers should exercise their own clinical judgment when providing care.    2.   You may only use this information for purposes of treatment, payment or health care operations activities, and subject to the limitations of applicable Collective Policies.    3.   You should consult directly with the organization that provided a care guideline or other clinical history with any questions about additional information or accuracy or completeness of information provided.    ? 2021 [employer] - [email or web address]

## 2020-05-10 NOTE — ED Provider Notes (Signed)
EMERGENCY DEPARTMENT NOTE     Patient initially seen and examined at   ED PHYSICIAN ASSIGNED     None         ED MIDLEVEL (APP) ASSIGNED     Date/Time Event User Comments    05/10/20 1124 PA/NP Provider Assigned Ruffin, Velvie Thomaston R Jamelyn Bovard R, FNP assigned as Nurse Practitioner          HISTORY OF PRESENT ILLNESS   Historian:Patient      Chief Complaint: Chest Pain, Generalized weakness, and Fatigue         74 y.o. female with history of R sided mastectomy, HTN, hyperlipidemia, hypothyroidism presents with complaint of R sided chest itchiness, mid sternal chest pain starting last night, no shortness of breath. States chronic cough, but denies symptoms is new or changed in quality. Patient states itchiness over scarring from mastectomy. Applied alcohol to the area, states burning sensation and then resolved. Itchiness remains. Also complaining of generalized non specific weakness and non specific fatigue. States very little rest recently due to itchiness over mastectomy incisions.     1. Location of symptoms: Mid sternal and R sided chest  2. Onset of symptoms: 1 day  3. What was patient doing when symptoms started (Context): see above  4. Severity: moderate  5. Timing: constant  6. Activities that worsen symptoms: none  7. Activities that improve symptoms: temporarily improved with topical alcohol application  8. Quality: constant.   9. Radiation of symptoms: no  10. Associated signs and Symptoms: see above  11. Are symptoms worsening? yes  MEDICAL HISTORY     Past Medical History:  Past Medical History:   Diagnosis Date   . Breast cancer 2014    right breast mastectomy   . GERD (gastroesophageal reflux disease)    . Hyperlipidemia    . Hypertension    . Hypothyroidism    . Malignant neoplasm of overlapping sites of right female breast 10/19/2015   . Vertigo        Past Surgical History:  Past Surgical History:   Procedure Laterality Date   . HYSTERECTOMY     . MASTECTOMY Right 2015       Social  History:  Social History     Socioeconomic History   . Marital status: Widowed     Spouse name: Not on file   . Number of children: Not on file   . Years of education: Not on file   . Highest education level: Not on file   Occupational History   . Not on file   Tobacco Use   . Smoking status: Never Smoker   . Smokeless tobacco: Never Used   Vaping Use   . Vaping Use: Never used   Substance and Sexual Activity   . Alcohol use: Yes     Alcohol/week: 1.0 standard drinks     Types: 1 Cans of beer per week     Comment: socially   . Drug use: No   . Sexual activity: Not on file   Other Topics Concern   . Not on file   Social History Narrative   . Not on file     Social Determinants of Health     Financial Resource Strain:    . Difficulty of Paying Living Expenses:    Food Insecurity:    . Worried About Programme researcher, broadcasting/film/video in the Last Year:    . [employer] of Food in the Last Year:  Transportation Needs:    . Freight forwarder (Medical):    Marland Kitchen Lack of Transportation (Non-Medical):    Physical Activity:    . Days of Exercise per Week:    . Minutes of Exercise per Session:    Stress:    . Feeling of Stress :    Social Connections:    . Frequency of Communication with Friends and Family:    . Frequency of Social Gatherings with Friends and Family:    . Attends Religious Services:    . Active Member of Clubs or Organizations:    . Attends Banker Meetings:    Marland Kitchen Marital Status:    Intimate Partner Violence:    . Fear of Current or Ex-Partner:    . Emotionally Abused:    Marland Kitchen Physically Abused:    . Sexually Abused:        Family History:  Family History   Problem Relation Age of Onset   . Breast cancer Neg Hx        Outpatient Medication:  Discharge Medication List as of 05/10/2020  3:28 PM      CONTINUE these medications which have NOT CHANGED    Details   amLODIPine (NORVASC) 10 MG tablet Take 1 tablet (10 mg total) by mouth daily, Starting Sat 03/21/2020, E-Rx      anastrozole (ARIMIDEX) 1 MG tablet Take 1 tablet  (1 mg total) by mouth daily, Starting Fri 01/19/2018, Normal      aspirin 81 MG chewable tablet Chew 1 tablet (81 mg total) by mouth daily, Starting Wed 01/29/2020, E-Rx      ezetimibe (ZETIA) 10 MG tablet Take 1 tablet (10 mg total) by mouth daily, Starting Thu 02/27/2020, E-Rx      lactobacillus/streptococcus (RISAQUAD) Cap Take 1 capsule by mouth daily, Starting Sat 03/21/2020, E-Rx      levothyroxine (SYNTHROID) 25 MCG tablet Take 1 tablet (25 mcg total) by mouth every morning, Starting Wed 01/29/2020, E-Rx      lisinopril (ZESTRIL) 40 MG tablet Take 1 tablet (40 mg total) by mouth daily, Starting Thu 04/30/2020, Normal      loperamide (IMODIUM) 2 MG capsule Take 1 capsule (2 mg total) by mouth 4 (four) times daily as needed for Diarrhea, Starting Fri 03/20/2020, E-Rx      meclizine (ANTIVERT) 25 MG tablet Take 1 tablet (25 mg total) by mouth 3 (three) times daily as needed for Dizziness, Starting Thu 08/08/2019, E-Rx      Multiple Vitamins-Minerals (MULTIVITAMIN WITH MINERALS) tablet Take 1 tablet by mouth every morning    , Historical Med      pantoprazole (PROTONIX) 40 MG tablet Take 1 tablet (40 mg total) by mouth every morning before breakfast, Starting Wed 01/29/2020, E-Rx      rosuvastatin (CRESTOR) 40 MG tablet Take 0.5 tablets (20 mg total) by mouth daily, Starting Wed 04/17/2019, Normal      vitamin B-1 (THIAMINE) 100 MG tablet Take 100 mg by mouth every morning., Historical Med               REVIEW OF SYSTEMS   Review of Systems   Constitutional: Positive for fatigue. Negative for activity change, appetite change, chills, diaphoresis, fever and unexpected weight change.   HENT: Negative.    Eyes: Negative.    Respiratory: Positive for cough (chronic). Negative for apnea, choking, chest tightness, shortness of breath, wheezing and stridor.    Cardiovascular: Positive for chest pain. Negative for palpitations and leg swelling.  Gastrointestinal: Negative.    Genitourinary: Negative.    Musculoskeletal: Negative.     Skin: Positive for wound (healed surgical wound). Negative for color change, pallor and rash.   Neurological: Negative for tremors, syncope, facial asymmetry, light-headedness, numbness and headaches.   Hematological: Negative.    Psychiatric/Behavioral: Negative.            PHYSICAL EXAM     ED Triage Vitals [05/10/20 1119]   Enc Vitals Group      BP 148/74      Heart Rate 79      Resp Rate 17      Temp 98.4 F (36.9 C)      Temp Source Oral      SpO2 97 %      Weight 64 kg      Height 1.6 m      Head Circumference       Peak Flow       Pain Score 9      Pain Loc       Pain Edu?       Excl. in GC?      Physical Exam  Constitutional:       Appearance: Normal appearance. She is normal weight.   HENT:      Head: Normocephalic and atraumatic.   Cardiovascular:      Rate and Rhythm: Normal rate and regular rhythm.      Pulses: Normal pulses.   Pulmonary:      Effort: Pulmonary effort is normal. No respiratory distress.      Breath sounds: Normal breath sounds. No stridor. No wheezing, rhonchi or rales.   Chest:      Chest wall: No tenderness.   Abdominal:      General: Abdomen is flat.      Palpations: Abdomen is soft.   Musculoskeletal:         General: Normal range of motion.      Cervical back: Normal range of motion and neck supple.   Skin:     General: Skin is warm.      Capillary Refill: Capillary refill takes less than 2 seconds.             Comments: Multiple well healed surgical incisions to R breast. No rash noted.    Neurological:      General: No focal deficit present.      Mental Status: She is alert and oriented to person, place, and time. Mental status is at baseline.   Psychiatric:         Mood and Affect: Mood normal.           MEDICAL DECISION MAKING     DISCUSSION          74 YO F with history of R sided mastectomy following breast cancer diagnosis, HTN, hypothyroidism, hyperlipidemia and recent hospital admission for acute colitis presents with mid sternal chest discomfort, R sided surgical  incision pruritis and fatigue x 24 hours. Denies shortness of breath. States ongoing chronic cough without changes to quality.  No acute leg swelling. Denies recent travel.    On exam- note well healed R breast wounds- no signed of infection.  Will order labs, ekg, chest xray.   CBC- r/o infectious process- Resulted within normal limits  CMP- r/o metabolic process- resulted within normal limits  Trop- r/o ischemia- resulted negative- will not repeat as symptoms greater than 24 hours  UA- r/o infection- within normal limits.  Xray- No acute changes- comparable to previous studies.   D-dimer- due to mid sternal chest pain- r/o for further evaluation via imaging- found to be elevated- CTAngio ordered  CT angio- negative for PE      Treated with IV fluid and tylenol- states improvement of chest pain, but continued with pruritis. Based on improvement, will send home and prescribe topical steroid for pruritis. Discussed use of antihistamine OTC for symptoms- including Claritin or benadryl. Discussed sedation risk for use of benadryl.   Discussed follow up with PCP and cardiology for symptoms.   Discussed follow up in ED as needed for worsening symptoms. Patient in agreement with plan of care and all questions answered.   All labs and vital signs from the current visit have been reviewed and any abnormality that is present is not due to severe sepsis or septic shock.    Vital Signs: Reviewed the patient's vital signs.   Nursing Notes: Reviewed and utilized available nursing notes.  Medical Records Reviewed: Reviewed available past medical records.  Counseling: The emergency provider has spoken with the patient and discussed today's findings, in addition to providing specific details for the plan of care.  Questions are answered and there is agreement with the plan.          CARDIAC STUDIES    The following cardiac studies were independently interpreted by the Emergency Medicine NP.  For full cardiac study results please  see chart.      EKG Interpretation:  Signed and interpreted by ED NP  Time Interpreted: 1150  Comparison: 03/10/2020  Rate: 76  Rhythm: NSR  Axis: normal  Intervals: normal  Interpretation: Nonspecific  EKG    EMERGENCY IMAGING STUDIES    The following imagine studies were independently interpreted by me (emergency np):    Radiology:  Interpreted by me (ED np)  Study: Chest Xray   Results: No infiltrate. No pneumothorax. No hemothorax. Mild cardiomegaly. No CHF.  Impression: No acute changes compared to previous 11/2018.             RADIOLOGY IMAGING STUDIES      CT Angio Chest   Final Result    No evidence of pulmonary embolism. Generalized thickening of   the wall of the esophagus. Follow-up endoscopy should be considered.      Merri Ray, MD    05/10/2020 2:45 PM      XR Chest 2 Views   Final Result      1. Chronic hyperinflation of the lung fields, scar at the right lung   base. No acute interval change.   2. Mild cardiomegaly and tortuous aorta unchanged. There is no CHF.      Charlene Brooke, MD    05/10/2020 11:46 AM            PULSE OXIMETRY    Oxygen Saturation by Pulse Oximetry: 98%  Interventions: none  Interpretation:  WNL    EMERGENCY DEPT. MEDICATIONS      ED Medication Orders (From admission, onward)    Start Ordered     Status Ordering Provider    05/10/20 1525 05/10/20 1524  acetaminophen (TYLENOL) tablet 1,000 mg  Once     Route: Oral  Ordered Dose: 1,000 mg     Last MAR action: Given Dawna Jakes R    05/10/20 1430 05/10/20 1430  iohexol (OMNIPAQUE) 350 MG/ML injection 100 mL  IMG once as needed     Route: Intravenous  Ordered Dose: 100 mL  Last MAR action: Imaging Agent Given Bryli Mantey R          LABORATORY RESULTS    Ordered and independently interpreted AVAILABLE laboratory tests. Please see results section in chart for full details.  Results for orders placed or performed during the hospital encounter of 05/10/20   CBC and differential   Result Value Ref Range    WBC 6.90 3.1 - 9.5  x10 3/uL    Hgb 11.5 11.4 - 14.8 g/dL    Hematocrit 16.1 09.6 - 43.7 %    Platelets 306 142 - 346 x10 3/uL    RBC 4.33 3.90 - 5.10 x10 6/uL    MCV 87.8 78.0 - 96.0 fL    MCH 26.6 25.1 - 33.5 pg    MCHC 30.3 (L) 31 - 35 g/dL    RDW 14 04.5 - 40.9 %    MPV 11.7 8.9 - 12.5 fL    Neutrophils 65.7 None %    Lymphocytes Automated 25.9 None %    Monocytes 5.9 None %    Eosinophils Automated 1.6 None %    Basophils Automated 0.6 None %    Immature Granulocytes 0.3 None %    Nucleated RBC 0.0 0.0 - 0.0 /100 WBC    Neutrophils Absolute 4.53 1 - 6 x10 3/uL    Lymphocytes Absolute Automated 1.79 0.42 - 3.22 x10 3/uL    Monocytes Absolute Automated 0.41 0.21 - 0.85 x10 3/uL    Eosinophils Absolute Automated 0.11 0.00 - 0.44 x10 3/uL    Basophils Absolute Automated 0.04 0.00 - 0.08 x10 3/uL    Immature Granulocytes Absolute 0.02 0.00 - 0.07 x10 3/uL    Absolute NRBC 0.00 0.00 - 0.00 x10 3/uL   Comprehensive metabolic panel   Result Value Ref Range    Glucose 98 70 - 100 mg/dL    BUN 14 7 - 19 mg/dL    Creatinine 0.8 0.6 - 1.0 mg/dL    Sodium 811 914 - 782 mEq/L    Potassium 4.1 3.5 - 5.1 mEq/L    Chloride 105 100 - 111 mEq/L    CO2 24 22 - 29 mEq/L    Calcium 9.7 7.9 - 10.2 mg/dL    Protein, Total 7.3 6.0 - 8.3 g/dL    Albumin 4.3 3.5 - 5.0 g/dL    AST (SGOT) 22 5 - 34 U/L    ALT 11 0 - 55 U/L    Alkaline Phosphatase 83 37 - 106 U/L    Bilirubin, Total 0.2 0.2 - 1.2 mg/dL    Globulin 3.0 2.0 - 3.6 g/dL    Albumin/Globulin Ratio 1.4 0.9 - 2.2    Anion Gap 14.0 5 - 15   Troponin I   Result Value Ref Range    Troponin I <0.01 0.00 - 0.05 ng/mL   UA Reflex to Micro - Reflex to Culture   Result Value Ref Range    Urine Type Urine, Clean Ca     Color, UA Straw Colorless - Yellow    Clarity, UA Clear Clear - Hazy    Specific Gravity UA 1.006 1.001 - 1.035    Urine pH 7.0 5.0 - 8.0    Leukocyte Esterase, UA Negative Negative    Nitrite, UA Negative Negative    Protein, UR Negative Negative    Glucose, UA Negative Negative    Ketones UA  Negative Negative    Urobilinogen, UA Negative 0.2 - 2.0 mg/dL    Bilirubin, UA Negative  Negative    Blood, UA Negative Negative    RBC, UA 0 - 2 0 - 5 /hpf    WBC, UA 0 - 5 0 - 5 /hpf    Squamous Epithelial Cells, Urine 0 - 5 0 - 25 /hpf   GFR   Result Value Ref Range    EGFR >60.0    D-Dimer   Result Value Ref Range    D-Dimer 0.93 (H) 0.00 - 0.60 ug/mL FEU   ECG 12 Lead   Result Value Ref Range    Ventricular Rate 76 BPM    Atrial Rate 76 BPM    P-R Interval 174 ms    QRS Duration 72 ms    Q-T Interval 358 ms    QTC Calculation (Bezet) 402 ms    P Axis 58 degrees    R Axis 41 degrees    T Axis -24 degrees   ECG 12 lead   Result Value Ref Range    Ventricular Rate 73 BPM    Atrial Rate 73 BPM    P-R Interval 176 ms    QRS Duration 72 ms    Q-T Interval 344 ms    QTC Calculation (Bezet) 378 ms    P Axis 56 degrees    R Axis 43 degrees    T Axis -27 degrees   ECG 12 lead   Result Value Ref Range    Ventricular Rate 75 BPM    Atrial Rate 75 BPM    P-R Interval 176 ms    QRS Duration 70 ms    Q-T Interval 346 ms    QTC Calculation (Bezet) 386 ms    P Axis 59 degrees    R Axis 43 degrees    T Axis -20 degrees       CRITICAL CARE/PROCEDURES    Procedures    DIAGNOSIS      Diagnosis:  Final diagnoses:   Chest discomfort   Pruritus       Disposition:  ED Disposition     ED Disposition Condition Date/Time Comment    Discharge  Sun May 10, 2020  3:27 PM Emily [name] discharge to home/self care.    Condition at disposition: Stable          Prescriptions:  Discharge Medication List as of 05/10/2020  3:28 PM      START taking these medications    Details   desonide (DESOWEN) 0.05 % ointment Apply topically 2 (two) times daily, Starting Sun 05/10/2020, E-Rx         CONTINUE these medications which have NOT CHANGED    Details   amLODIPine (NORVASC) 10 MG tablet Take 1 tablet (10 mg total) by mouth daily, Starting Sat 03/21/2020, E-Rx      anastrozole (ARIMIDEX) 1 MG tablet Take 1 tablet (1 mg total) by mouth daily, Starting Fri  01/19/2018, Normal      aspirin 81 MG chewable tablet Chew 1 tablet (81 mg total) by mouth daily, Starting Wed 01/29/2020, E-Rx      ezetimibe (ZETIA) 10 MG tablet Take 1 tablet (10 mg total) by mouth daily, Starting Thu 02/27/2020, E-Rx      lactobacillus/streptococcus (RISAQUAD) Cap Take 1 capsule by mouth daily, Starting Sat 03/21/2020, E-Rx      levothyroxine (SYNTHROID) 25 MCG tablet Take 1 tablet (25 mcg total) by mouth every morning, Starting Wed 01/29/2020, E-Rx      lisinopril (ZESTRIL) 40 MG tablet Take 1 tablet (40 mg total)  by mouth daily, Starting Thu 04/30/2020, Normal      loperamide (IMODIUM) 2 MG capsule Take 1 capsule (2 mg total) by mouth 4 (four) times daily as needed for Diarrhea, Starting Fri 03/20/2020, E-Rx      meclizine (ANTIVERT) 25 MG tablet Take 1 tablet (25 mg total) by mouth 3 (three) times daily as needed for Dizziness, Starting Thu 08/08/2019, E-Rx      Multiple Vitamins-Minerals (MULTIVITAMIN WITH MINERALS) tablet Take 1 tablet by mouth every morning    , Historical Med      pantoprazole (PROTONIX) 40 MG tablet Take 1 tablet (40 mg total) by mouth every morning before breakfast, Starting Wed 01/29/2020, E-Rx      rosuvastatin (CRESTOR) 40 MG tablet Take 0.5 tablets (20 mg total) by mouth daily, Starting Wed 04/17/2019, Normal      vitamin B-1 (THIAMINE) 100 MG tablet Take 100 mg by mouth every morning., Historical Med             This note was generated by the Epic EMR system/ Dragon speech recognition and may contain inherent errors or omissions not intended by the user. Grammatical errors, random word insertions, deletions and pronoun errors  are occasional consequences of this technology due to software limitations. Not all errors are caught or corrected. If there are questions or concerns about the content of this note or information contained within the body of this dictation they should be addressed directly with the author for clarification.       Glori Bickers, [place]  05/12/20  1416

## 2020-05-12 ENCOUNTER — Emergency Department: Payer: Medicare Other

## 2020-05-12 ENCOUNTER — Emergency Department
Admission: EM | Admit: 2020-05-12 | Discharge: 2020-05-12 | Disposition: A | Discharge status: Home / Self Care | Payer: Medicare Other | Attending: Emergency Medicine | Admitting: Emergency Medicine

## 2020-05-12 DIAGNOSIS — N644 Mastodynia: Secondary | ICD-10-CM | POA: Insufficient documentation

## 2020-05-12 DIAGNOSIS — E785 Hyperlipidemia, unspecified: Secondary | ICD-10-CM | POA: Insufficient documentation

## 2020-05-12 DIAGNOSIS — R531 Weakness: Secondary | ICD-10-CM | POA: Insufficient documentation

## 2020-05-12 DIAGNOSIS — R202 Paresthesia of skin: Secondary | ICD-10-CM | POA: Insufficient documentation

## 2020-05-12 DIAGNOSIS — R519 Headache, unspecified: Secondary | ICD-10-CM | POA: Insufficient documentation

## 2020-05-12 DIAGNOSIS — E039 Hypothyroidism, unspecified: Secondary | ICD-10-CM | POA: Insufficient documentation

## 2020-05-12 DIAGNOSIS — Z20822 Contact with and (suspected) exposure to covid-19: Secondary | ICD-10-CM | POA: Insufficient documentation

## 2020-05-12 DIAGNOSIS — Z7982 Long term (current) use of aspirin: Secondary | ICD-10-CM | POA: Insufficient documentation

## 2020-05-12 DIAGNOSIS — I10 Essential (primary) hypertension: Secondary | ICD-10-CM | POA: Insufficient documentation

## 2020-05-12 DIAGNOSIS — R079 Chest pain, unspecified: Secondary | ICD-10-CM | POA: Insufficient documentation

## 2020-05-12 DIAGNOSIS — Z9011 Acquired absence of right breast and nipple: Secondary | ICD-10-CM | POA: Insufficient documentation

## 2020-05-12 DIAGNOSIS — Z853 Personal history of malignant neoplasm of breast: Secondary | ICD-10-CM | POA: Insufficient documentation

## 2020-05-12 LAB — COVID-19 (SARS-COV-2) & INFLUENZA  A/B, NAA (ROCHE LIAT)
Influenza A: NOT DETECTED
Influenza B: NOT DETECTED
SARS CoV 2 Overall Result: NOT DETECTED

## 2020-05-12 LAB — ECG 12-LEAD
Atrial Rate: 76 {beats}/min
P Axis: 58 degrees
P-R Interval: 174 ms
Q-T Interval: 358 ms
QRS Duration: 72 ms
QTC Calculation (Bezet): 402 ms
R Axis: 41 degrees
T Axis: -24 degrees
Ventricular Rate: 76 {beats}/min

## 2020-05-12 LAB — CBC AND DIFFERENTIAL
Absolute NRBC: 0 10*3/uL (ref 0.00–0.00)
Basophils Absolute Automated: 0.04 10*3/uL (ref 0.00–0.08)
Basophils Automated: 0.7 %
Eosinophils Absolute Automated: 0.04 10*3/uL (ref 0.00–0.44)
Eosinophils Automated: 0.7 %
Hematocrit: 41.8 % (ref 34.7–43.7)
Hgb: 12.6 g/dL (ref 11.4–14.8)
Immature Granulocytes Absolute: 0.02 10*3/uL (ref 0.00–0.07)
Immature Granulocytes: 0.3 %
Lymphocytes Absolute Automated: 1.85 10*3/uL (ref 0.42–3.22)
Lymphocytes Automated: 32.1 %
MCH: 26.3 pg (ref 25.1–33.5)
MCHC: 30.1 g/dL — ABNORMAL LOW (ref 31.5–35.8)
MCV: 87.3 fL (ref 78.0–96.0)
MPV: 11.5 fL (ref 8.9–12.5)
Monocytes Absolute Automated: 0.34 10*3/uL (ref 0.21–0.85)
Monocytes: 5.9 %
Neutrophils Absolute: 3.48 10*3/uL (ref 1.10–6.33)
Neutrophils: 60.3 %
Nucleated RBC: 0 /100 WBC (ref 0.0–0.0)
Platelets: 333 10*3/uL (ref 142–346)
RBC: 4.79 10*6/uL (ref 3.90–5.10)
RDW: 14 % (ref 11–15)
WBC: 5.77 10*3/uL (ref 3.10–9.50)

## 2020-05-12 LAB — COMPREHENSIVE METABOLIC PANEL
ALT: 13 U/L (ref 0–55)
AST (SGOT): 25 U/L (ref 5–34)
Albumin/Globulin Ratio: 1.3 (ref 0.9–2.2)
Albumin: 4.2 g/dL (ref 3.5–5.0)
Alkaline Phosphatase: 74 U/L (ref 37–106)
Anion Gap: 14 (ref 5.0–15.0)
BUN: 13 mg/dL (ref 7–19)
Bilirubin, Total: 0.5 mg/dL (ref 0.2–1.2)
CO2: 24 mEq/L (ref 22–29)
Calcium: 9.5 mg/dL (ref 7.9–10.2)
Chloride: 104 mEq/L (ref 100–111)
Creatinine: 0.9 mg/dL (ref 0.6–1.0)
Globulin: 3.2 g/dL (ref 2.0–3.6)
Glucose: 105 mg/dL — ABNORMAL HIGH (ref 70–100)
Potassium: 4.6 mEq/L (ref 3.5–5.1)
Protein, Total: 7.4 g/dL (ref 6.0–8.3)
Sodium: 142 mEq/L (ref 136–145)

## 2020-05-12 LAB — GFR: EGFR: >60

## 2020-05-12 LAB — URINALYSIS, REFLEX TO MICROSCOPIC EXAM IF INDICATED
Bilirubin, UA: NEGATIVE
Blood, UA: NEGATIVE
Glucose, UA: NEGATIVE
Ketones UA: 5 — AB
Leukocyte Esterase, UA: NEGATIVE
Nitrite, UA: NEGATIVE
Protein, UR: 100 — AB
Specific Gravity UA: 1.015 (ref 1.001–1.035)
Urine pH: 8 (ref 5.0–8.0)
Urobilinogen, UA: NEGATIVE mg/dL (ref 0.2–2.0)

## 2020-05-12 LAB — TROPONIN I: Troponin I: <0.01 ng/mL (ref 0.00–0.05)

## 2020-05-12 MED ORDER — ACETAMINOPHEN 325 MG PO TABS
650.0000 mg | ORAL_TABLET | Freq: Once | ORAL | Status: AC
Start: 2020-05-12 — End: 2020-05-12
  Administered 2020-05-12: 650 mg via ORAL
  Filled 2020-05-12: qty 2

## 2020-05-12 MED ORDER — SODIUM CHLORIDE 0.9 % IV BOLUS
1000.0000 mL | Freq: Once | INTRAVENOUS | Status: AC
Start: 2020-05-12 — End: 2020-05-12
  Administered 2020-05-12: 1000 mL via INTRAVENOUS

## 2020-05-12 MED ORDER — KETOROLAC TROMETHAMINE 30 MG/ML IJ SOLN
15.0000 mg | Freq: Once | INTRAMUSCULAR | Status: AC
Start: 2020-05-12 — End: 2020-05-12
  Administered 2020-05-12: 15 mg via INTRAVENOUS
  Filled 2020-05-12: qty 1

## 2020-05-12 MED ORDER — DIPHENHYDRAMINE HCL 50 MG/ML IJ SOLN
25.0000 mg | Freq: Once | INTRAMUSCULAR | Status: AC
Start: 2020-05-12 — End: 2020-05-12
  Administered 2020-05-12: 25 mg via INTRAVENOUS
  Filled 2020-05-12: qty 1

## 2020-05-12 NOTE — Discharge Instructions (Signed)
Tylenol and motrin as needed for pain or fever.    Follow up with PCP for further evaluation of today's symptoms  Follow up with your oncologist for further evaluation of your right breast pain  Return to ED for any new or worsening symptoms or concerns including persistent or worsening numbness, tingling, weakness in your arms or legs, worsening, persistent or change in headache symptoms, difficulty speaking or walking or any other concern.      Mastodynia    You have been diagnosed with mastodynia.     Mastodynia is pain in the breasts. It is common, and it has many causes. Some of the causes are cyclical. This means that they follow a pattern, most often related to your menstrual cycle (period). Other causes are noncyclical. This means that they don't follow a pattern. Mastodynia is different than mastitis. Mastitis happens when the milk ducts of the breast get blocked, which happens in women who are breastfeeding.     Most of the time cyclical breast pain is normal. It most often happens in the week or days before your period. It usually goes away when your period starts. This pain is caused by ovulation. This is because ovulation causes your breast tissue to grow. Birth control pills and hormone therapy can also cause pain in the breasts. You will most often feel pain in both breasts. The pain is strongest at the top of the breast and the place where the breasts connect to your chest.    Noncyclical breast pain has many causes.     The hormone changes of puberty, pregnancy, and menopause can cause pain in the breasts. However, you will most often feel pain on only one side of the breast. The pain might be constant or it might come and go.      Breast size is also important. Large breasts stretch the connective tissue that support the breast (called Cooper's ligaments), which can also cause pain.        Other causes of breast pain are tumors and infections such as abscess. (An abscess is an area of  infected fluid that forms under your skin.) However, with an infection, your skin will most often be red or blistered, or show some other change. Most often, breast cancer is not painful.      Your breast pain might be caused by something other than your breasts. For example, you might have pain in the muscles close to the breasts. You might also have a problem with the nerves of your spine. These things make it seem like your breast is in pain.      Also, breast pain is connected to a medical problem called ductal ectasia. With ductal ectasia, you have a fever along with swelling in your milk ducts.    Most often, the cause of your breast pain can be found through your medical history and a physical exam. You might have more tests done if your doctor thinks your pain has a dangerous cause such as cancer. For example, you might have an ultrasound, a CAT scan, or an MRI.    Treatment usually depends on the cause of the pain. Anti-inflammatory medications are helpful. If your pain is very serious, though, you might get a medicine like tamoxifen (Nolvadex, Soltamox) or danazol (Danocrine).     Follow up with your primary care doctor or OB-GYN (women's doctor) for a breast exam every year. You should also follow up if the pain is still there.  If you have large breasts, wear a bra with good support.     Your doctor might have you take acetaminophen or an anti-inflammatory medicine such as ibuprofen. Your doctor might also give you an anti-inflammatory medicine that can be rubbed into your skin. Use the medicine as directed.     Don't drive or operate heavy machinery if you are given narcotic (opiate) pain medicine.    Though we don't believe your condition is serious right now, it is important to be careful. Sometimes a problem that seems mild can become serious later. This is why it is very important that you return here or go to the nearest Emergency Department if you are not improving or your  symptoms are getting worse.    YOU SHOULD SEEK MEDICAL ATTENTION IMMEDIATELY, EITHER HERE OR AT THE NEAREST EMERGENCY DEPARTMENT, IF ANY OF THE FOLLOWING OCCUR:     Your breast pain gets worse. Something unusual comes out of your breasts.    You get a fever (temperature higher than 100.19F or 38C) or chills.    You have uncontrollable nausea (feeling sick to your stomach). You have uncontrollable vomiting (throwing up).     If you can't follow up with your doctor, or if at any time you feel you need to be rechecked or seen again, come back here or go to the nearest emergency department.               Paresthesias    You have been seen for paresthesias.    Paresthesia is an abnormal sensation (feeling) in any part of the body. The paresthesia itself has no long-term bad effects. People often describe it as tingling, numbness, burning, or pricking of the skin. Many say it feels like "pins and needles," or like the body part is asleep.    Paresthesias can be a symptom of some illnesses. This means there are many things that can cause paresthesias. The paresthesias can be a sign of an underlying medical condition.     Some causes of paresthesias are:   Skin Problems: Irritation of skin by certain chemicals. Swelling of the skin from an injury. A burn or frostbite can feel like numbness.   Pressure on a nerve. This can happen when your arm "falls asleep" from lying on it too long. Carpal tunnel syndrome can do the same thing.   Hyperventilation (rapid or deep breathing).   Deficiency in some vitamins and minerals. This includes vitamins B1, B5, and B12.   Electrolyte problems.   Diabetic neuropathy (nerve disorders) from long-standing diabetes.   Problems with circulation.   Strokes.    You may have had some testing to help find out the cause of your paresthesias.    We still do not know the cause of your paresthesias. However, it is OK for you to go home. You may need more tests to figure out the  cause.    See your primary care doctor or the referral specialist for more work-up and management of your paresthesias.    YOU SHOULD SEEK MEDICAL ATTENTION IMMEDIATELY, EITHER HERE OR AT THE NEAREST EMERGENCY DEPARTMENT, IF ANY OF THE FOLLOWING OCCUR:   Your arms get weak, numb or paralyzed (can't move), especially on one side.   You have vision loss, trouble speaking or problems thinking.   Your speech is abnormal or one side of your face droops.   You lose consciousness ("pass out") or almost lose consciousness.   You have numbness or tingling after  a head, neck or back injury.   You feel very dizzy or like the room is spinning.   You have other concerns.               Paresthesias    You have been seen for paresthesias.    Paresthesia is an abnormal sensation (feeling) in any part of the body. The paresthesia itself has no long-term bad effects. People often describe it as tingling, numbness, burning, or pricking of the skin. Many say it feels like "pins and needles," or like the body part is asleep.    Paresthesias can be a symptom of some illnesses. This means there are many things that can cause paresthesias. The paresthesias can be a sign of an underlying medical condition.     Some causes of paresthesias are:   Skin Problems: Irritation of skin by certain chemicals. Swelling of the skin from an injury. A burn or frostbite can feel like numbness.   Pressure on a nerve. This can happen when your arm "falls asleep" from lying on it too long. Carpal tunnel syndrome can do the same thing.   Hyperventilation (rapid or deep breathing).   Deficiency in some vitamins and minerals. This includes vitamins B1, B5, and B12.   Electrolyte problems.   Diabetic neuropathy (nerve disorders) from long-standing diabetes.   Problems with circulation.   Strokes.    You may have had some testing to help find out the cause of your paresthesias.    We still do not know the cause of your paresthesias.  However, it is OK for you to go home. You may need more tests to figure out the cause.    See your primary care doctor or the referral specialist for more work-up and management of your paresthesias.    The doctor has given you a prescription to have more tests done to monitor your paresthesias. Take the prescription to the lab on the day of your test.     YOU SHOULD SEEK MEDICAL ATTENTION IMMEDIATELY, EITHER HERE OR AT THE NEAREST EMERGENCY DEPARTMENT, IF ANY OF THE FOLLOWING OCCUR:   Your arms get weak, numb or paralyzed (can't move), especially on one side.   You have vision loss, trouble speaking or problems thinking.   Your speech is abnormal or one side of your face droops.   You lose consciousness ("pass out") or almost lose consciousness.   You have numbness or tingling after a head, neck or back injury.   You feel very dizzy or like the room is spinning.   You have other concerns.               Weakness / Fatigue    You have been seen today for generalized weakness. This may also be described as fatigue.    Weakness is a common problem, especially in older individuals.    It is important to understand the difference between true weakness (real weakness from a nerve or brain problem) and the more common problem of fatigue. These words might seem similar but they do mean very different problems.   Fatigue: When a person is describing fatigue, they may feel tired out very quickly even with just a little activity. They may also say they are feeling tired, sleepy, easily exhausted and unable to do normal daily activities because they don't seem to have enough energy.   True Weakness: When someone has true weakness, it means that the muscles are not working right. For example, a  leg might be truly weak if you can't support your weight on it or if you can't get up from a chair because the thigh muscles aren't strong enough.    There are many causes of weakness including; Infections (often  kidney/bladder infections or pneumonias), electrolyte abnormalities (low sodium, low potassium), depression, and neurologic (brain or nerve) disorders.    After looking at the results of the blood tests or X-rays, the cause of your weakness is:   Unclear or unknown.    It is VERY IMPORTANT to see your primary care doctor. More testing may be needed to figure out the cause of your weakness.    YOU SHOULD SEEK MEDICAL ATTENTION IMMEDIATELY, EITHER HERE OR AT THE NEAREST EMERGENCY DEPARTMENT, IF ANY OF THE FOLLOWING OCCURS:   Confusion, coma, agitation (becoming anxious or irritable).   Fever (temperature higher than 100.30F / 38C), vomiting.   Severe headache.   Signs of stroke (paralysis or numbness on one side of the body, drooping on one side of the face, difficulty talking).   Worsening weakness, difficulty standing, paralysis, loss of control of the bladder or bowels or difficulty swallowing.

## 2020-05-12 NOTE — ED Provider Notes (Signed)
EMERGENCY DEPARTMENT NOTE     Patient initially seen and examined at   ED PHYSICIAN ASSIGNED     None         ED MIDLEVEL (APP) ASSIGNED     Date/Time Event User Comments    05/12/20 1258 PA/NP Provider Assigned Art Buff, FNP assigned as Nurse Practitioner    05/12/20 1305 PA/NP Provider Assigned Art Buff, FNP assigned as Nurse Practitioner          HISTORY OF PRESENT ILLNESS   Historian:Patient  Translator Used: no    Chief Complaint: Chest Pain, Back Pain, Tingling, and Headache     Mechanism of Injury: denies    74 y.o. female with h/o breast CA (sx-R mastectomy), GERD, HLD, HTN, hypothyroidism, and vertigo who presents to the ED for evaluation of weakness/fatigue, headache, right sided chest/breast incision site pain with radiation to back/right shoulder, and bilateral arm and leg tingling. Reports chest pain, weakness/fatigue started 2-3 days ago and is now worse. Reports headache and bilateral arm and leg tingling started last night (between 9-10pm).  Patient reports no real significant provement in symptoms from when she was seen in the ED 2 days ago.    PCP- Dr. Abran [name]    1. Location of symptoms: see above  2. Onset of symptoms: see above  3. What was patient doing when symptoms started (Context): see above  4. Severity: moderate  5. Timing: constant  6. Activities that worsen symptoms: nothing  7. Activities that improve symptoms: nothing tried  8. Quality: weakness, tingling, throbbing headache  9. Radiation of symptoms: no  10. Associated signs and Symptoms: see above  11. Are symptoms worsening? yes  MEDICAL HISTORY     Past Medical History:  Past Medical History:   Diagnosis Date   . Breast cancer 2014    right breast mastectomy   . GERD (gastroesophageal reflux disease)    . Hyperlipidemia    . Hypertension    . Hypothyroidism    . Malignant neoplasm of overlapping sites of right female breast 10/19/2015   . Vertigo        Past Surgical  History:  Past Surgical History:   Procedure Laterality Date   . HYSTERECTOMY     . MASTECTOMY Right 2015       Social History:  Social History     Socioeconomic History   . Marital status: Widowed     Spouse name: Not on file   . Number of children: Not on file   . Years of education: Not on file   . Highest education level: Not on file   Occupational History   . Not on file   Tobacco Use   . Smoking status: Never Smoker   . Smokeless tobacco: Never Used   Vaping Use   . Vaping Use: Never used   Substance and Sexual Activity   . Alcohol use: Yes     Alcohol/week: 1.0 standard drinks     Types: 1 Cans of beer per week     Comment: socially   . Drug use: No   . Sexual activity: Not on file   Other Topics Concern   . Not on file   Social History Narrative   . Not on file     Social Determinants of Health     Financial Resource Strain:    . Difficulty of Paying Living Expenses:    Food Insecurity:    .  Worried About Programme researcher, broadcasting/film/video in the Last Year:    . Barista in the Last Year:    Transportation Needs:    . Freight forwarder (Medical):    Marland Kitchen Lack of Transportation (Non-Medical):    Physical Activity:    . Days of Exercise per Week:    . Minutes of Exercise per Session:    Stress:    . Feeling of Stress :    Social Connections:    . Frequency of Communication with Friends and Family:    . Frequency of Social Gatherings with Friends and Family:    . Attends Religious Services:    . Active Member of Clubs or Organizations:    . Attends Banker Meetings:    Marland Kitchen Marital Status:    Intimate Partner Violence:    . Fear of Current or Ex-Partner:    . Emotionally Abused:    Marland Kitchen Physically Abused:    . Sexually Abused:        Family History:  Family History   Problem Relation Age of Onset   . Breast cancer Neg Hx        Outpatient Medication:  Discharge Medication List as of 05/12/2020  4:11 PM      CONTINUE these medications which have NOT CHANGED    Details   amLODIPine (NORVASC) 10 MG tablet Take 1  tablet (10 mg total) by mouth daily, Starting Sat 03/21/2020, E-Rx      anastrozole (ARIMIDEX) 1 MG tablet Take 1 tablet (1 mg total) by mouth daily, Starting Fri 01/19/2018, Normal      aspirin 81 MG chewable tablet Chew 1 tablet (81 mg total) by mouth daily, Starting Wed 01/29/2020, E-Rx      desonide (DESOWEN) 0.05 % ointment Apply topically 2 (two) times daily, Starting Sun 05/10/2020, E-Rx      ezetimibe (ZETIA) 10 MG tablet Take 1 tablet (10 mg total) by mouth daily, Starting Thu 02/27/2020, E-Rx      lactobacillus/streptococcus (RISAQUAD) Cap Take 1 capsule by mouth daily, Starting Sat 03/21/2020, E-Rx      levothyroxine (SYNTHROID) 25 MCG tablet Take 1 tablet (25 mcg total) by mouth every morning, Starting Wed 01/29/2020, E-Rx      lisinopril (ZESTRIL) 40 MG tablet Take 1 tablet (40 mg total) by mouth daily, Starting Thu 04/30/2020, Normal      loperamide (IMODIUM) 2 MG capsule Take 1 capsule (2 mg total) by mouth 4 (four) times daily as needed for Diarrhea, Starting Fri 03/20/2020, E-Rx      meclizine (ANTIVERT) 25 MG tablet Take 1 tablet (25 mg total) by mouth 3 (three) times daily as needed for Dizziness, Starting Thu 08/08/2019, E-Rx      Multiple Vitamins-Minerals (MULTIVITAMIN WITH MINERALS) tablet Take 1 tablet by mouth every morning    , Historical Med      pantoprazole (PROTONIX) 40 MG tablet Take 1 tablet (40 mg total) by mouth every morning before breakfast, Starting Wed 01/29/2020, E-Rx      rosuvastatin (CRESTOR) 40 MG tablet Take 0.5 tablets (20 mg total) by mouth daily, Starting Wed 04/17/2019, Normal      vitamin B-1 (THIAMINE) 100 MG tablet Take 100 mg by mouth every morning., Historical Med               REVIEW OF SYSTEMS   Review of Systems   Constitutional: Positive for fatigue. Negative for activity change, appetite change, chills, diaphoresis, fever and unexpected weight  change.   HENT: Negative.    Eyes: Negative.    Respiratory: Positive for cough (chronic per patient) and chest tightness.  Negative for shortness of breath and wheezing.    Cardiovascular: Positive for chest pain. Negative for palpitations and leg swelling.   Gastrointestinal: Negative.  Negative for abdominal pain, diarrhea, nausea and vomiting.   Genitourinary: Negative.    Musculoskeletal: Positive for back pain. Negative for neck pain.   Skin: Negative for rash and wound.   Neurological: Positive for weakness, light-headedness, numbness (tingling sensation in bilateral arms and legs) and headaches. Negative for syncope and speech difficulty.   All other systems reviewed and are negative.      PHYSICAL EXAM     ED Triage Vitals [05/12/20 1223]   Enc Vitals Group      BP 146/67      Heart Rate 98      Resp Rate 16      Temp 99.1 F (37.3 C)      Temp Source Oral      SpO2 93 %      Weight 68.9 kg      Height       Head Circumference       Peak Flow       Pain Score 9      Pain Loc       Pain Edu?       Excl. in GC?      Nursing note and vitals reviewed.  Constitutional: Well developed, well nourished. No distress noted.  Head: Atraumatic. Normocephalic.   Eyes: PERRL. EOMI. Conjunctivae are not pale.  ENT: Mucous membranes are moist and intact. Oropharynx is clear and symmetric. Patent airway.  Neck: Supple. Full ROM.   Cardiovascular: Regular rate. Regular rhythm. No murmurs, rubs, or gallops.  Pulmonary/Chest: No evidence of respiratory distress.Clear to auscultation bilaterally. No wheezing, rales or rhonchi.   Abdominal: Soft and non-distended. There is no tenderness, rebound, guarding, or rigidity.   Back: Full ROM. Nontender.  Extremities: No edema. No cyanosis. No clubbing. Full range of motion in all extremities.  Skin: Skin is warm and dry. No diaphoresis. No rash.   Neurological: Alert, awake, and appropriate. Motor normal. Cranial nerves II-XII are intact.  Strength is 5/5 in the upper and lower extremities bilaterally.  Patient reports decreased sensation to left lateral leg and right upper arm.  Sensation described as pain and when touching her right arm reports pain in her head. No pronator drift. No aphasia or dysphasia, Normal speech.  Normal cerebellar function during finger-nose-finger and heel to shin.    Psychiatric: Good eye contact. Normal interaction, affect, and behavior.      MEDICAL DECISION MAKING     DISCUSSION    Previous medical records reviewed.  Patient seen in ED on 05/10/2020 for similar symptoms.  CTA negative for PE. Labs reviewed. Trop negative. EKG with NSR.     CBC to assess for anemia and infection.  CMP to assess for electrolyte abnormalities and kidney function  Lipase assess for pancreatitis   Trop r/o ischemia  UA r/o infection and assess for hematuria  EKG r/o STEMI or arrhythmia  IVF for hydration    Discussed with Dr. Thad Ranger. Agreeable with plan.     EKG with normal sinus rhythm. UA without evidence of infection. Labs reviewed. Trop negative.  CXR with no acute findings.  CT head with no acute findings.  Patient reports improvement with symptoms.  Patient appears well, nontoxic.  Afebrile. Medications as prescribed. Tylenol and motrin as needed for pain or fever.  Follow up with PCP. Return to ED for any new or worsening symptoms or concerns. Plan of care discussed with patient, patient agreeable.      NIH Stroke Score      Most Recent Value   Patient's calculated Stroke Score:  1 filed at 05/12/2020 1240          All labs and vital signs from the current visit have been reviewed and any abnormality that is present is not due to severe sepsis or septic shock.    Vital Signs: Reviewed the patient's vital signs.   Nursing Notes: Reviewed and utilized available nursing notes.  Medical Records Reviewed: Reviewed available past medical records.  Counseling: The emergency provider has spoken with the patient and discussed today's findings, in addition to providing specific details for the plan of care.  Questions are answered and there is agreement with the plan.      MIPS  DOCUMENTATION      HEADACHE  INDICATIONS FOR CT HEAD DUE TO PRIMARY HEADACHE    Head CT for primary headache indications - change in type of headache      CARDIAC STUDIES    The following cardiac studies were independently interpreted by the Emergency Medicine NP.  For full cardiac study results please see chart.    Monitor Strip  Interpreted by ED NP  Rate: 80s  Rhythm: NSR   ST Changes: none    EKG Interpretation:  Signed and interpreted by ED Provider   Time Interpreted: 1227  Comparison: No significant change found when compared to previous EKG on 05/10/2020  Rate:85  Rhythm: NSR  Axis: normal  Intervals: normal  Blocks: no blocks  ST segments: no ST elevation  Interpretation: NSR      RADIOLOGY IMAGING STUDIES      Chest AP Portable   Final Result      1. No radiographic evidence of acute cardiopulmonary findings.      Jeanette Caprice, MD    05/12/2020 3:55 PM      CT Head without Contrast   Final Result       No acute intracranial abnormality.      Recommendation:   None.      Adaline Sill, MD    05/12/2020 2:46 PM            PULSE OXIMETRY    Oxygen Saturation by Pulse Oximetry: 95%  Interventions: none  Interpretation: normal    EMERGENCY DEPT. MEDICATIONS      ED Medication Orders (From admission, onward)    Start Ordered     Status Ordering Provider    05/12/20 1524 05/12/20 1524  ketorolac (TORADOL) injection 15 mg  Once     Route: Intravenous  Ordered Dose: 15 mg     Last MAR action: Given Azile Minardi N    05/12/20 1334 05/12/20 1333  acetaminophen (TYLENOL) tablet 650 mg  Once     Route: Oral  Ordered Dose: 650 mg     Last MAR action: Given Kalin Kyler N    05/12/20 1334 05/12/20 1333  sodium chloride 0.9 % bolus 1,000 mL  Once     Route: Intravenous  Ordered Dose: 1,000 mL     Last MAR action: Stopped Shontay Wallner N    05/12/20 1334 05/12/20 1333  diphenhydrAMINE (BENADRYL) injection 25 mg  Once     Route: Intravenous  Ordered Dose: 25 mg  Last MAR action: Given Mckenzye Cutright N           LABORATORY RESULTS    Ordered and independently interpreted AVAILABLE laboratory tests. Please see results section in chart for full details.  Results for orders placed or performed during the hospital encounter of 05/12/20   COVID-19 (SARS-COV-2) and Influenza ([facility])    Specimen: Nasopharyngeal   Result Value Ref Range    Purpose of COVID testing Diagnostic -PUI     SARS-CoV-2 Specimen Source Nasopharyngeal     SARS CoV 2 Overall Result Not Detected     Influenza A Not Detected     Influenza B Not Detected    CBC and differential   Result Value Ref Range    WBC 5.77 3.1 - 9.5 x10 3/uL    Hgb 12.6 11.4 - 14.8 g/dL    Hematocrit 16.1 09.6 - 43.7 %    Platelets 333 142 - 346 x10 3/uL    RBC 4.79 3.90 - 5.10 x10 6/uL    MCV 87.3 78.0 - 96.0 fL    MCH 26.3 25.1 - 33.5 pg    MCHC 30.1 (L) 31 - 35 g/dL    RDW 14 04.5 - 40.9 %    MPV 11.5 8.9 - 12.5 fL    Neutrophils 60.3 None %    Lymphocytes Automated 32.1 None %    Monocytes 5.9 None %    Eosinophils Automated 0.7 None %    Basophils Automated 0.7 None %    Immature Granulocytes 0.3 None %    Nucleated RBC 0.0 0.0 - 0.0 /100 WBC    Neutrophils Absolute 3.48 1 - 6 x10 3/uL    Lymphocytes Absolute Automated 1.85 0.42 - 3.22 x10 3/uL    Monocytes Absolute Automated 0.34 0.21 - 0.85 x10 3/uL    Eosinophils Absolute Automated 0.04 0.00 - 0.44 x10 3/uL    Basophils Absolute Automated 0.04 0.00 - 0.08 x10 3/uL    Immature Granulocytes Absolute 0.02 0.00 - 0.07 x10 3/uL    Absolute NRBC 0.00 0.00 - 0.00 x10 3/uL   Troponin I   Result Value Ref Range    Troponin I <0.01 0.00 - 0.05 ng/mL   UA, Reflex to Microscopic   Result Value Ref Range    Urine Type Clean Catch     Color, UA Yellow Colorless - Yellow    Clarity, UA Sl Cloudy (A) Clear - Hazy    Specific Gravity UA 1.015 1.001 - 1.035    Urine pH 8.0 5.0 - 8.0    Leukocyte Esterase, UA Negative Negative    Nitrite, UA Negative Negative    Protein, UR 100 (A) Negative    Glucose, UA Negative Negative    Ketones UA  5 (A) Negative    Urobilinogen, UA Negative 0.2 - 2.0 mg/dL    Bilirubin, UA Negative Negative    Blood, UA Negative Negative    RBC, UA 3 - 5 0 - 5 /hpf    WBC, UA 0 - 5 0 - 5 /hpf   Comprehensive metabolic panel   Result Value Ref Range    Glucose 105 (H) 70 - 100 mg/dL    BUN 13 7 - 19 mg/dL    Creatinine 0.9 0.6 - 1.0 mg/dL    Sodium 811 914 - 782 mEq/L    Potassium 4.6 3.5 - 5.1 mEq/L    Chloride 104 100 - 111 mEq/L    CO2 24 22 - 29 mEq/L  Calcium 9.5 7.9 - 10.2 mg/dL    Protein, Total 7.4 6.0 - 8.3 g/dL    Albumin 4.2 3.5 - 5.0 g/dL    AST (SGOT) 25 5 - 34 U/L    ALT 13 0 - 55 U/L    Alkaline Phosphatase 74 37 - 106 U/L    Bilirubin, Total 0.5 0.2 - 1.2 mg/dL    Globulin 3.2 2.0 - 3.6 g/dL    Albumin/Globulin Ratio 1.3 0.9 - 2.2    Anion Gap 14.0 5 - 15   GFR   Result Value Ref Range    EGFR >60.0    ECG 12 Lead   Result Value Ref Range    Ventricular Rate 85 BPM    Atrial Rate 85 BPM    P-R Interval 138 ms    QRS Duration 64 ms    Q-T Interval 356 ms    QTC Calculation (Bezet) 423 ms    P Axis 5 degrees    R Axis 16 degrees    T Axis 47 degrees       CRITICAL CARE/PROCEDURES    Procedures    DIAGNOSIS      Diagnosis:  Final diagnoses:   Breast pain, right   Paresthesias   Acute nonintractable headache, unspecified headache type   Generalized weakness   Chest pain, unspecified type       Disposition:  ED Disposition     ED Disposition Condition Date/Time Comment    Discharge  Tue May 12, 2020  4:07 PM Emily [name] discharge to home/self care.    Condition at disposition: Stable          Prescriptions:  Discharge Medication List as of 05/12/2020  4:11 PM      CONTINUE these medications which have NOT CHANGED    Details   amLODIPine (NORVASC) 10 MG tablet Take 1 tablet (10 mg total) by mouth daily, Starting Sat 03/21/2020, E-Rx      anastrozole (ARIMIDEX) 1 MG tablet Take 1 tablet (1 mg total) by mouth daily, Starting Fri 01/19/2018, Normal      aspirin 81 MG chewable tablet Chew 1 tablet (81 mg total) by mouth  daily, Starting Wed 01/29/2020, E-Rx      desonide (DESOWEN) 0.05 % ointment Apply topically 2 (two) times daily, Starting Sun 05/10/2020, E-Rx      ezetimibe (ZETIA) 10 MG tablet Take 1 tablet (10 mg total) by mouth daily, Starting Thu 02/27/2020, E-Rx      lactobacillus/streptococcus (RISAQUAD) Cap Take 1 capsule by mouth daily, Starting Sat 03/21/2020, E-Rx      levothyroxine (SYNTHROID) 25 MCG tablet Take 1 tablet (25 mcg total) by mouth every morning, Starting Wed 01/29/2020, E-Rx      lisinopril (ZESTRIL) 40 MG tablet Take 1 tablet (40 mg total) by mouth daily, Starting Thu 04/30/2020, Normal      loperamide (IMODIUM) 2 MG capsule Take 1 capsule (2 mg total) by mouth 4 (four) times daily as needed for Diarrhea, Starting Fri 03/20/2020, E-Rx      meclizine (ANTIVERT) 25 MG tablet Take 1 tablet (25 mg total) by mouth 3 (three) times daily as needed for Dizziness, Starting Thu 08/08/2019, E-Rx      Multiple Vitamins-Minerals (MULTIVITAMIN WITH MINERALS) tablet Take 1 tablet by mouth every morning    , Historical Med      pantoprazole (PROTONIX) 40 MG tablet Take 1 tablet (40 mg total) by mouth every morning before breakfast, Starting Wed 01/29/2020, E-Rx  rosuvastatin (CRESTOR) 40 MG tablet Take 0.5 tablets (20 mg total) by mouth daily, Starting Wed 04/17/2019, Normal      vitamin B-1 (THIAMINE) 100 MG tablet Take 100 mg by mouth every morning., Historical Med             This note was generated by the Epic EMR system/ Dragon speech recognition and may contain inherent errors or omissions not intended by the user. Grammatical errors, random word insertions, deletions and pronoun errors  are occasional consequences of this technology due to software limitations. Not all errors are caught or corrected. If there are questions or concerns about the content of this note or information contained within the body of this dictation they should be addressed directly with the author for clarification.     Daune Perch,  FNP  05/18/20 1534       Georgana Curio, MD  05/19/20 [phone]

## 2020-05-12 NOTE — EDIE (Signed)
COLLECTIVE?NOTIFICATION?05/12/2020 12:15?Emily [name], Emily [name]?MRN: [id number]    [facility] [place] [facility]'s patient encounter information:   VWU:[id number]  Account [id number]  Billing Account [id number]      Criteria Met      5 ED Visits in 12 Months    Security and Safety  No recent Security Events currently on file    ED Care Guidelines  There are currently no ED Care Guidelines for this patient. Please check your facility's medical records system.        Prescription Monitoring Program  000??- Narcotic Use Score  000??- Sedative Use Score  000??- Stimulant Use Score  000??- Overdose Risk Score  - All Scores range from 000-999 with 75% of the population scoring < 200 and on 1% scoring above 650  - The last digit of the narcotic, sedative, and stimulant score indicates the number of active prescriptions of that type  - Higher Use scores correlate with increased prescribers, pharmacies, mg equiv, and overlapping prescriptions  - Higher Overdose Risk Scores correlate with increased risk of unintentional overdose death   Concerning or unexpectedly high scores should prompt a review of the PMP record; this does not constitute checking PMP for prescribing purposes.      E.D. Visit Count (12 mo.)  Facility Visits   [place] [facility] 7   Total 7   Note: Visits indicate total known visits.     Recent Emergency Department Visit Summary  Date Facility [facility] Type Diagnoses or Chief Complaint   May 12, 2020 [place] [facility] [place] [facility]      Chest Pain,Tingling,Weak      May 10, 2020 [place] [facility] [place] [facility]      weakness. lump by lt ear      Fatigue      Chest Pain      Generalized weakness      Pruritus, unspecified      Other chest pain      Mar 10, 2020 [place] [facility] [place] [facility]      abd pain      Generalized weakness      Abdominal Pain      Contact with and (suspected) exposure to covid-19      Mar 08, 2020 [place] [facility] [place]  [facility]      fatige,      fatige, weakness      Breast Pain      Other chest pain      Chest pain, unspecified      Other specified anxiety disorders      Acquired absence of right breast and nipple      Jan 27, 2020 [place] [facility] [place] [facility]      possible stroke      Numbness      Dec 28, 2019 [place] [facility] [place] [facility]      both breast pain      Chest Pain      Mastodynia      Secondary hypertension, unspecified      Aug 08, 2019 [place] [facility] [place] [facility] [place] [facility]      fall [id number]/facial injuries      Fall      Contusion of other part of head, initial encounter      Cervicalgia      Pleurodynia      Fall (on) (from)  other stairs and steps, initial encounter      Pain in right shoulder          Recent Inpatient Visit Summary  Date Facility [facility] Type Diagnoses or Chief Complaint   Mar 10, 2020 [place] [facility] [place] [facility]      Contact with and (suspected) exposure to covid-19      Noninfective gastroenteritis and colitis, unspecified      Jan 27, 2020 [place] [facility]      Weakness          Care Team  Provider Specialty Phone Fax Service Dates   DAVIDSON, Janet Berlin MD M, MD Family Medicine: Adult Medicine [phone] Current      Collective Portal  This patient has registered at the [facility] Emergency Department   For more information visit: [email or web address]     PLEASE NOTE:     1.   Any care recommendations and other clinical information are provided as guidelines or for historical purposes only, and providers should exercise their own clinical judgment when providing care.    2.   You may only use this information for purposes of treatment, payment or health care operations activities, and subject to the limitations of applicable Collective Policies.    3.   You should consult directly with the organization that  provided a care guideline or other clinical history with any questions about additional information or accuracy or completeness of information provided.    ? 2021 [place], [employer] - [email or web address]

## 2020-05-13 LAB — ECG 12-LEAD
Atrial Rate: 85 {beats}/min
P Axis: 5 degrees
P-R Interval: 138 ms
Q-T Interval: 356 ms
QRS Duration: 64 ms
QTC Calculation (Bezet): 423 ms
R Axis: 16 degrees
T Axis: 47 degrees
Ventricular Rate: 85 {beats}/min

## 2020-05-25 ENCOUNTER — Other Ambulatory Visit: Payer: Self-pay | Admitting: Hematology & Oncology

## 2020-05-25 DIAGNOSIS — C50919 Malignant neoplasm of unspecified site of unspecified female breast: Secondary | ICD-10-CM

## 2020-05-28 ENCOUNTER — Observation Stay
Admission: EM | Admit: 2020-05-28 | Discharge: 2020-05-29 | Disposition: A | Discharge status: Home / Self Care | Payer: Medicare Other | Attending: Internal Medicine | Admitting: Internal Medicine

## 2020-05-28 ENCOUNTER — Emergency Department: Payer: Medicare Other

## 2020-05-28 DIAGNOSIS — Z7982 Long term (current) use of aspirin: Secondary | ICD-10-CM | POA: Insufficient documentation

## 2020-05-28 DIAGNOSIS — Z20822 Contact with and (suspected) exposure to covid-19: Secondary | ICD-10-CM | POA: Insufficient documentation

## 2020-05-28 DIAGNOSIS — I10 Essential (primary) hypertension: Secondary | ICD-10-CM | POA: Insufficient documentation

## 2020-05-28 DIAGNOSIS — R001 Bradycardia, unspecified: Secondary | ICD-10-CM | POA: Insufficient documentation

## 2020-05-28 DIAGNOSIS — R200 Anesthesia of skin: Secondary | ICD-10-CM | POA: Insufficient documentation

## 2020-05-28 DIAGNOSIS — R42 Dizziness and giddiness: Principal | ICD-10-CM | POA: Insufficient documentation

## 2020-05-28 DIAGNOSIS — Z79899 Other long term (current) drug therapy: Secondary | ICD-10-CM | POA: Insufficient documentation

## 2020-05-28 DIAGNOSIS — R519 Headache, unspecified: Secondary | ICD-10-CM | POA: Insufficient documentation

## 2020-05-28 DIAGNOSIS — R531 Weakness: Secondary | ICD-10-CM

## 2020-05-28 DIAGNOSIS — K219 Gastro-esophageal reflux disease without esophagitis: Secondary | ICD-10-CM | POA: Insufficient documentation

## 2020-05-28 DIAGNOSIS — E039 Hypothyroidism, unspecified: Secondary | ICD-10-CM | POA: Insufficient documentation

## 2020-05-28 DIAGNOSIS — R260 Ataxic gait: Secondary | ICD-10-CM | POA: Insufficient documentation

## 2020-05-28 DIAGNOSIS — R202 Paresthesia of skin: Secondary | ICD-10-CM | POA: Insufficient documentation

## 2020-05-28 LAB — CBC AND DIFFERENTIAL
Absolute NRBC: 0 10*3/uL (ref 0.00–0.00)
Basophils Absolute Automated: 0.03 10*3/uL (ref 0.00–0.08)
Basophils Automated: 0.5 %
Eosinophils Absolute Automated: 0.09 10*3/uL (ref 0.00–0.44)
Eosinophils Automated: 1.5 %
Hematocrit: 34.8 % (ref 34.7–43.7)
Hgb: 10.6 g/dL — ABNORMAL LOW (ref 11.4–14.8)
Immature Granulocytes Absolute: 0.02 10*3/uL (ref 0.00–0.07)
Immature Granulocytes: 0.3 %
Lymphocytes Absolute Automated: 1.87 10*3/uL (ref 0.42–3.22)
Lymphocytes Automated: 31.9 %
MCH: 26.9 pg (ref 25.1–33.5)
MCHC: 30.5 g/dL — ABNORMAL LOW (ref 31.5–35.8)
MCV: 88.3 fL (ref 78.0–96.0)
MPV: 11.3 fL (ref 8.9–12.5)
Monocytes Absolute Automated: 0.55 10*3/uL (ref 0.21–0.85)
Monocytes: 9.4 %
Neutrophils Absolute: 3.3 10*3/uL (ref 1.10–6.33)
Neutrophils: 56.4 %
Nucleated RBC: 0 /100 WBC (ref 0.0–0.0)
Platelets: 294 10*3/uL (ref 142–346)
RBC: 3.94 10*6/uL (ref 3.90–5.10)
RDW: 14 % (ref 11–15)
WBC: 5.86 10*3/uL (ref 3.10–9.50)

## 2020-05-28 LAB — GLUCOSE WHOLE BLOOD - POCT: Whole Blood Glucose POCT: 103 mg/dL — ABNORMAL HIGH (ref 70–100)

## 2020-05-28 LAB — COMPREHENSIVE METABOLIC PANEL
ALT: 12 U/L (ref 0–55)
AST (SGOT): 17 U/L (ref 5–34)
Albumin/Globulin Ratio: 1.5 (ref 0.9–2.2)
Albumin: 3.7 g/dL (ref 3.5–5.0)
Alkaline Phosphatase: 75 U/L (ref 37–106)
Anion Gap: 11 (ref 5.0–15.0)
BUN: 9 mg/dL (ref 7–19)
Bilirubin, Total: 0.3 mg/dL (ref 0.2–1.2)
CO2: 26 mEq/L (ref 22–29)
Calcium: 9.2 mg/dL (ref 7.9–10.2)
Chloride: 104 mEq/L (ref 100–111)
Creatinine: 0.8 mg/dL (ref 0.6–1.0)
Globulin: 2.4 g/dL (ref 2.0–3.6)
Glucose: 96 mg/dL (ref 70–100)
Potassium: 4 mEq/L (ref 3.5–5.1)
Protein, Total: 6.1 g/dL (ref 6.0–8.3)
Sodium: 141 mEq/L (ref 136–145)

## 2020-05-28 LAB — PT/INR
PT INR: 1 (ref 0.9–1.1)
PT: 11.6 s (ref 10.1–12.9)

## 2020-05-28 LAB — COVID-19 (SARS-COV-2): SARS CoV 2 Overall Result: NEGATIVE

## 2020-05-28 LAB — APTT: PTT: 37 s (ref 27–39)

## 2020-05-28 LAB — GFR: EGFR: >60

## 2020-05-28 LAB — TROPONIN I: Troponin I: <0.01 ng/mL (ref 0.00–0.05)

## 2020-05-28 MED ORDER — RISAQUAD PO CAPS
1.0000 | ORAL_CAPSULE | Freq: Every day | ORAL | Status: DC
Start: 2020-05-28 — End: 2020-05-29
  Administered 2020-05-28 – 2020-05-29 (×2): 1 via ORAL
  Filled 2020-05-28 (×2): qty 1

## 2020-05-28 MED ORDER — ACETAMINOPHEN 650 MG RE SUPP
650.0000 mg | Freq: Four times a day (QID) | RECTAL | Status: DC | PRN
Start: 2020-05-28 — End: 2020-05-29

## 2020-05-28 MED ORDER — ROSUVASTATIN CALCIUM 10 MG PO TABS
20.0000 mg | ORAL_TABLET | Freq: Every day | ORAL | Status: DC
Start: 2020-05-28 — End: 2020-05-29
  Administered 2020-05-28 – 2020-05-29 (×2): 20 mg via ORAL
  Filled 2020-05-28 (×2): qty 2

## 2020-05-28 MED ORDER — MAGNESIUM SULFATE IN D5W 1-5 GM/100ML-% IV SOLN
1.0000 g | Freq: Once | INTRAVENOUS | Status: AC
Start: 2020-05-28 — End: 2020-05-28
  Administered 2020-05-28: 1 g via INTRAVENOUS
  Filled 2020-05-28: qty 100

## 2020-05-28 MED ORDER — ASPIRIN 325 MG PO TABS
325.0000 mg | ORAL_TABLET | Freq: Every day | ORAL | Status: AC
Start: 2020-05-28 — End: 2020-05-28
  Administered 2020-05-28: 325 mg via ORAL
  Filled 2020-05-28: qty 1

## 2020-05-28 MED ORDER — MAGNESIUM SULFATE IN D5W 1-5 GM/100ML-% IV SOLN
1.0000 g | Freq: Two times a day (BID) | INTRAVENOUS | Status: DC
Start: 2020-05-28 — End: 2020-05-29
  Administered 2020-05-28 – 2020-05-29 (×2): 1 g via INTRAVENOUS
  Filled 2020-05-28 (×2): qty 100

## 2020-05-28 MED ORDER — MAGNESIUM HYDROXIDE 400 MG/5ML PO SUSP
30.0000 mL | Freq: Every day | ORAL | Status: DC | PRN
Start: 2020-05-28 — End: 2020-05-29

## 2020-05-28 MED ORDER — LISINOPRIL 20 MG PO TABS
40.0000 mg | ORAL_TABLET | Freq: Every day | ORAL | Status: DC
Start: 2020-05-28 — End: 2020-05-29
  Administered 2020-05-28 – 2020-05-29 (×2): 40 mg via ORAL
  Filled 2020-05-28 (×2): qty 2

## 2020-05-28 MED ORDER — MECLIZINE HCL 12.5 MG PO TABS
25.0000 mg | ORAL_TABLET | Freq: Three times a day (TID) | ORAL | Status: DC | PRN
Start: 2020-05-28 — End: 2020-05-29

## 2020-05-28 MED ORDER — EZETIMIBE 10 MG PO TABS
10.0000 mg | ORAL_TABLET | Freq: Every day | ORAL | Status: DC
Start: 2020-05-28 — End: 2020-05-29
  Administered 2020-05-28 – 2020-05-29 (×2): 10 mg via ORAL
  Filled 2020-05-28 (×2): qty 1

## 2020-05-28 MED ORDER — AMLODIPINE BESYLATE 5 MG PO TABS
10.0000 mg | ORAL_TABLET | Freq: Every day | ORAL | Status: DC
Start: 2020-05-28 — End: 2020-05-29
  Administered 2020-05-28 – 2020-05-29 (×2): 10 mg via ORAL
  Filled 2020-05-28 (×2): qty 2

## 2020-05-28 MED ORDER — THIAMINE (VITAMIN B1) 100 MG PO TABS (WRAP)
100.0000 mg | ORAL_TABLET | Freq: Every morning | ORAL | Status: DC
Start: 2020-05-29 — End: 2020-05-29
  Administered 2020-05-29: 100 mg via ORAL
  Filled 2020-05-28: qty 1

## 2020-05-28 MED ORDER — LEVOTHYROXINE SODIUM 25 MCG PO TABS
25.0000 ug | ORAL_TABLET | Freq: Every morning | ORAL | Status: DC
Start: 2020-05-29 — End: 2020-05-29
  Administered 2020-05-29: 25 ug via ORAL
  Filled 2020-05-28: qty 1

## 2020-05-28 MED ORDER — NALOXONE HCL 0.4 MG/ML IJ SOLN (WRAP)
0.2000 mg | INTRAMUSCULAR | Status: DC | PRN
Start: 2020-05-28 — End: 2020-05-29

## 2020-05-28 MED ORDER — PANTOPRAZOLE SODIUM 40 MG PO TBEC
40.0000 mg | DELAYED_RELEASE_TABLET | Freq: Every morning | ORAL | Status: DC
Start: 2020-05-29 — End: 2020-05-29
  Administered 2020-05-29: 40 mg via ORAL
  Filled 2020-05-28: qty 1

## 2020-05-28 MED ORDER — ACETAMINOPHEN 325 MG PO TABS
650.0000 mg | ORAL_TABLET | Freq: Four times a day (QID) | ORAL | Status: DC | PRN
Start: 2020-05-28 — End: 2020-05-29
  Administered 2020-05-29: 650 mg via ORAL
  Filled 2020-05-28: qty 2

## 2020-05-28 MED ORDER — HEPARIN SODIUM (PORCINE) 5000 UNIT/ML IJ SOLN
5000.0000 [IU] | Freq: Two times a day (BID) | INTRAMUSCULAR | Status: DC
Start: 2020-05-28 — End: 2020-05-29
  Administered 2020-05-28 – 2020-05-29 (×2): 5000 [IU] via SUBCUTANEOUS
  Filled 2020-05-28 (×2): qty 1

## 2020-05-28 MED ORDER — ASPIRIN 81 MG PO CHEW
81.0000 mg | CHEWABLE_TABLET | Freq: Every day | ORAL | Status: DC
Start: 2020-05-29 — End: 2020-05-29
  Administered 2020-05-29: 81 mg via ORAL
  Filled 2020-05-28: qty 1

## 2020-05-28 MED ORDER — GLUCAGON 1 MG IJ SOLR (WRAP)
1.0000 mg | INTRAMUSCULAR | Status: DC | PRN
Start: 2020-05-28 — End: 2020-05-29

## 2020-05-28 MED ORDER — LOPERAMIDE HCL 2 MG PO CAPS
2.0000 mg | ORAL_CAPSULE | Freq: Four times a day (QID) | ORAL | Status: DC | PRN
Start: 2020-05-28 — End: 2020-05-29

## 2020-05-28 MED ORDER — ANASTROZOLE 1 MG PO TABS
1.0000 mg | ORAL_TABLET | Freq: Every day | ORAL | Status: DC
  Administered 2020-05-29: 1 mg via ORAL
  Filled 2020-05-28: qty 1

## 2020-05-28 MED ORDER — MELATONIN 3 MG PO TABS
3.0000 mg | ORAL_TABLET | Freq: Every evening | ORAL | Status: DC | PRN
Start: 2020-05-28 — End: 2020-05-29

## 2020-05-28 MED ORDER — DEXTROSE 50 % IV SOLN
12.5000 g | INTRAVENOUS | Status: DC | PRN
Start: 2020-05-28 — End: 2020-05-29

## 2020-05-28 MED ORDER — GLUCOSE 40 % PO GEL
15.0000 g | ORAL | Status: DC | PRN
Start: 2020-05-28 — End: 2020-05-29

## 2020-05-28 NOTE — UM Notes (Signed)
Payer: Medicare    Admit to Observation (Order [id number])  Admission  Date: 05/28/2020 Department: [facility] Intermediate Care Released By/Authorizing: Georgana Curio, MD (auto-released)   General Information    No case/log ID found   Order Information    Order Date/Time Release Date/Time Start Date/Time End Date/Time   05/28/20 01:19 PM              HPI 05/28/20    "Patient is a 74 year old lady comes into the hospital for new onset of left face arm and leg numbness and gait ataxia difficulty walking and balance problems that started this morning she also reports moderately severe headache, affecting in the entire head when asked her that she has had similar symptoms in the past she said no, but patient has been hospitalized multiple times with similar presentations, with negative brain MRI"    VS: HR: 69-54    Labs: unrevealing  CTA(-)    EKG(-)    ER TX  Mag sulfate 1g IV    Plan, per Neurology    "  Assessment & Plan:   Gait disturbance dizziness difficulty walking ataxia tingling numbness sensation left face arm and leg, ongoing since this morning cause unclear patient has had multiple similar presentations in the past with negative work-up?  Psychogenic?  Migraine phenomena given she is complaining of headache CT head was negative  ? Trial with IV magnesium sulfate, will give 1 mg of IV Ativan to see if this helps with her symptoms  ? Wait and see how patient does clinically, will hold off on MRI given she has had multiple MRIs done in the past, and her clinical exam seems overall nonfocal except for some reduced light touch temperature on the left side of the face(similar findings have been found multiple times in the past)"      [employer], Case Manager, [phone]

## 2020-05-28 NOTE — Consults (Signed)
NEUROLOGY CONSULTATION    Date Time: 05/28/20 2:27 PM  Patient Name: Emily [name]  Attending Physician: Laveda Norman, MD      Assessment & Plan:   Gait disturbance dizziness difficulty walking ataxia tingling numbness sensation left face arm and leg, ongoing since this morning cause unclear patient has had multiple similar presentations in the past with negative work-up?  Psychogenic?  Migraine phenomena given she is complaining of headache CT head was negative   Trial with IV magnesium sulfate, will give 1 mg of IV Ativan to see if this helps with her symptoms   Wait and see how patient does clinically, will hold off on MRI given she has had multiple MRIs done in the past, and her clinical exam seems overall nonfocal except for some reduced light touch temperature on the left side of the face(similar findings have been found multiple times in the past)    History of Present Illness:   Patient is a 74 year old lady comes into the hospital for new onset of left face arm and leg numbness and gait ataxia difficulty walking and balance problems that started this morning she also reports moderately severe headache, affecting in the entire head when asked her that she has had similar symptoms in the past she said no, but patient has been hospitalized multiple times with similar presentations, with negative brain MRI    Past Medical History:     Past Medical History:   Diagnosis Date   . Breast cancer 2014    right breast mastectomy   . GERD (gastroesophageal reflux disease)    . Hyperlipidemia    . Hypertension    . Hypothyroidism    . Malignant neoplasm of overlapping sites of right female breast 10/19/2015   . Vertigo        Meds:   Norvasc Arimidex aspirin Zetia Rafael Gonzalez overnight Synthroid Zestril Imodium meclizine Protonix B1 Crestor    Allergies   Allergen Reactions   . Morphine Itching   . Morphine And Related Itching and Nausea And Vomiting     dizzy   . Caffeine      Personal preference       Social & Family  History:     Social History     Socioeconomic History   . Marital status: Widowed     Spouse name: Not on file   . Number of children: Not on file   . Years of education: Not on file   . Highest education level: Not on file   Occupational History   . Not on file   Tobacco Use   . Smoking status: Never Smoker   . Smokeless tobacco: Never Used   Vaping Use   . Vaping Use: Never used   Substance and Sexual Activity   . Alcohol use: Yes     Alcohol/week: 1.0 standard drinks     Types: 1 Cans of beer per week     Comment: socially   . Drug use: No   . Sexual activity: Not on file   Other Topics Concern   . Not on file   Social History Narrative   . Not on file     Social Determinants of Health     Financial Resource Strain:    . Difficulty of Paying Living Expenses:    Food Insecurity:    . Worried About Programme researcher, broadcasting/film/video in the Last Year:    . [employer] of Food in the Last Year:  Transportation Needs:    . Freight forwarder (Medical):    Marland Kitchen Lack of Transportation (Non-Medical):    Physical Activity:    . Days of Exercise per Week:    . Minutes of Exercise per Session:    Stress:    . Feeling of Stress :    Social Connections:    . Frequency of Communication with Friends and Family:    . Frequency of Social Gatherings with Friends and Family:    . Attends Religious Services:    . Active Member of Clubs or Organizations:    . Attends Banker Meetings:    Marland Kitchen Marital Status:    Intimate Partner Violence:    . Fear of Current or Ex-Partner:    . Emotionally Abused:    Marland Kitchen Physically Abused:    . Sexually Abused:        Family History   Problem Relation Age of Onset   . Breast cancer Neg Hx            CODE STATUS: Full code no elder abuse no falls non-smoker    Review of Systems:   Moderate headache, no eye, ear nose, throat problems; no coughing or wheezing or shortness of breath, No chest pain or orthopnea, no abdominal pain, nausea or vomiting, No pain in the body or extremities, no psychiatric,  neurological, endocrine, hematological or cardiac complaints except as noted above.       Physical Exam:   Blood pressure 168/74, pulse (!) 58, temperature 98.3 F (36.8 C), temperature source Oral, resp. rate 18, weight 71.9 kg (158 lb 8.2 oz), SpO2 99 %.  Somewhat odd affect  HEENT: Normocephalic.no carotid bruits  Lungs:  CTA bil  Abd Soft   Cardiac:  S1,S2, normal rate and rhythm  Neck: supple, no cartoid bruits  Extremities: no edema  Skin: no rashes seen in exposed areas     Neuro:  Level of consciousness:  Alert and appropriate  Oriented:  X 3  Cognition:  Intact naming, recognition, concentration and following complex commands  Cranial Nerves:  II-XII intact except altered light touch temperature in the left face  Strength:  No upper extremity drift, 5/5 strength x 4 extremities  Coordination:  Intact FTN testing  Reflexes:  +1 throughout, down going toes bil  Sensation: Intact x 4 extremities to LT, temp, in both upper and lower extremities, gait slightly ataxic, more consistent with astasia-abasia  Labs:     Recent Labs   Lab 05/28/20  1124   Glucose 96   BUN 9   Creatinine 0.8   Calcium 9.2   Sodium 141   Potassium 4.0   Chloride 104   CO2 26   Albumin 3.7   AST (SGOT) 17   ALT 12   Bilirubin, Total 0.3   Alkaline Phosphatase 75     Recent Labs   Lab 05/28/20  1124   WBC 5.86   Hgb 10.6*   Hematocrit 34.8   MCV 88.3   MCH 26.9   MCHC 30.5*   Platelets 294         Recent Labs     05/28/20  1137   PTT 37   PT 11.6   PT INR 1.0          Radiology Results (24 Hour)     Procedure Component Value Units Date/Time    CT Head WO Contrast [[id number]] Collected: 05/28/20 1244    Order Status: Completed Updated: 05/28/20 1248  Narrative:      CT HEAD WO CONTRAST  CLINICAL INDICATION: Tingling and dizziness    COMPARISON: 05/12/2020    TECHNIQUE:   Imaging was performed from the skull base to the vertex without contrast  administration.  Note: Note that CT scanning at this site  utilizes multiple dose  reduction  techniques including automatic exposure control, adjustment of  the MAS and/or KVP according to patient's size and use of iterative  reconstruction technique      FINDINGS:  Mild generalized parenchymal volume loss is present. The ventricles  are  normal in size and contour with respect to the patient's age. Multiple    lucencies are noted in the subcortical and periventricular white matter  bilaterally. No hemorrhage, mass effect or other acute change is noted      Impression:         No hemorrhage or acute abnormality. Stable chronic changes.    Heron Nay, MD   05/28/2020 12:46 PM           All recent brain and spine imaging (MRI, CT) personally reviewed.    Chart reviewed    Case discussed with: ED attending      This note was generated by the Epic EMR system/Speech recognition and may contain inherent errors or omissions not intended by the user. Grammatical errors, random word insertions, deletions and pronoun errors  are occasional consequences of this technology due to software limitations.   Not all errors are caught or corrected. If there are questions or concerns about the content of this note or information contained within the body of this dictation they should be addressed directly with the author for clarification.    Signed by: Cathe Mons, MD  Spectralink: [phone]      Answering Service: [phone]

## 2020-05-28 NOTE — H&P (Signed)
ADMISSION HISTORY AND PHYSICAL EXAM    Date Time: 05/28/20 5:18 PM  Patient Name: [name]  Attending Physician: Mechele Dawley, MD      History of Present Illness:   DENYLA CORTESE is a 74 y.o. female who presents to the hospital with   Chief Complaint   Patient presents with   . Tingling   . Headache         Patient is a 74 year old female with a history of hypertension hyperlipidemia hypothyroidism presents with tingling all over decreased light touch on forehead and left face  Felt "" tight all over  Patient seen in the emergency room admitted seen by neurologist been prescribed IV magnesium patient is currently awake and alert    Past Medical History:     Past Medical History:   Diagnosis Date   . Breast cancer 2014    right breast mastectomy   . GERD (gastroesophageal reflux disease)    . Hyperlipidemia    . Hypertension    . Hypothyroidism    . Malignant neoplasm of overlapping sites of right female breast 10/19/2015   . Vertigo        Past Surgical History:     Past Surgical History:   Procedure Laterality Date   . HYSTERECTOMY     . MASTECTOMY Right 2015       Family History:     Family History   Problem Relation Age of Onset   . Breast cancer Neg Hx        Social History:     Social History     Socioeconomic History   . Marital status: Widowed     Spouse name: Not on file   . Number of children: Not on file   . Years of education: Not on file   . Highest education level: Not on file   Occupational History   . Not on file   Tobacco Use   . Smoking status: Never Smoker   . Smokeless tobacco: Never Used   Vaping Use   . Vaping Use: Never used   Substance and Sexual Activity   . Alcohol use: Yes     Alcohol/week: 1.0 standard drinks     Types: 1 Cans of beer per week     Comment: socially   . Drug use: No   . Sexual activity: Not on file   Other Topics Concern   . Not on file   Social History Narrative   . Not on file     Social Determinants of Health     Financial Resource Strain:    . Difficulty of  Paying Living Expenses:    Food Insecurity:    . Worried About Programme researcher, broadcasting/film/video in the Last Year:    . Barista in the Last Year:    Transportation Needs:    . Freight forwarder (Medical):    Marland Kitchen Lack of Transportation (Non-Medical):    Physical Activity:    . Days of Exercise per Week:    . Minutes of Exercise per Session:    Stress:    . Feeling of Stress :    Social Connections:    . Frequency of Communication with Friends and Family:    . Frequency of Social Gatherings with Friends and Family:    . Attends Religious Services:    . Active Member of Clubs or Organizations:    . Attends Banker Meetings:    .  Marital Status:    Intimate Partner Violence:    . Fear of Current or Ex-Partner:    . Emotionally Abused:    Marland Kitchen Physically Abused:    . Sexually Abused:        Allergies:     Allergies   Allergen Reactions   . Morphine Itching   . Morphine And Related Itching and Nausea And Vomiting     dizzy   . Caffeine      Personal preference       Medications:     Medications Prior to Admission   Medication Sig   . amLODIPine (NORVASC) 10 MG tablet Take 1 tablet (10 mg total) by mouth daily   . anastrozole (ARIMIDEX) 1 MG tablet Take 1 tablet (1 mg total) by mouth daily (Patient taking differently: Take 1 mg by mouth every morning    )   . aspirin 81 MG chewable tablet Chew 1 tablet (81 mg total) by mouth daily   . desonide (DESOWEN) 0.05 % ointment Apply topically 2 (two) times daily   . ezetimibe (ZETIA) 10 MG tablet Take 1 tablet (10 mg total) by mouth daily   . lactobacillus/streptococcus (RISAQUAD) Cap Take 1 capsule by mouth daily   . levothyroxine (SYNTHROID) 25 MCG tablet Take 1 tablet (25 mcg total) by mouth every morning   . lisinopril (ZESTRIL) 40 MG tablet Take 1 tablet (40 mg total) by mouth daily   . loperamide (IMODIUM) 2 MG capsule Take 1 capsule (2 mg total) by mouth 4 (four) times daily as needed for Diarrhea   . meclizine (ANTIVERT) 25 MG tablet Take 1 tablet (25 mg total) by  mouth 3 (three) times daily as needed for Dizziness   . Multiple Vitamins-Minerals (MULTIVITAMIN WITH MINERALS) tablet Take 1 tablet by mouth every morning       . pantoprazole (PROTONIX) 40 MG tablet Take 1 tablet (40 mg total) by mouth every morning before breakfast   . rosuvastatin (CRESTOR) 40 MG tablet Take 0.5 tablets (20 mg total) by mouth daily   . vitamin B-1 (THIAMINE) 100 MG tablet Take 100 mg by mouth every morning.       Review of Systems:    A comprehensive review of systems was: General ROS: negative for - chills, fever or night sweats  Psychological ROS: negative for - anxiety, depression, disorientation, hallucinations or suicidal ideation  ENT ROS: negative for - headaches, nasal congestion, sinus pain or visual changes  Allergy and Immunology ROS: negative for - itchy/watery eyes, nasal congestion or postnasal drip  Hematological and Lymphatic ROS: negative for - bleeding problems, bruising, pallor or weight loss  Endocrine ROS: negative for - malaise/lethargy or polydipsia/polyuria  Respiratory ROS: negative for - cough, orthopnea, shortness of breath or wheezing  Cardiovascular ROS: negative for - chest pain, dyspnea on exertion, orthopnea or shortness of breath  Gastrointestinal ROS: negative for - abdominal pain, constipation, diarrhea or nausea/vomiting  Genito-Urinary ROS: negative for - change in urinary stream, dysuria, hematuria or nocturia  Musculoskeletal ROS: negative for - joint pain, joint stiffness or joint swelling  Neurological ROS: Vague symptoms of leg tingling all over   dermatological ROS: negative for pruritus, rash and skin lesion changes      Physical Exam:     Vitals:    05/28/20 1631   BP: 165/68   Pulse: (!) 54   Resp: 18   Temp: 97.7 F (36.5 C)   SpO2: 100%       Intake  and Output Summary (Last 24 hours) at Date Time  No intake or output data in the 24 hours ending 05/28/20 1718     General appearance -age-appropriate  Mental status - alert, oriented to person, place,  and time  Eyes - pupils equal and reactive, extraocular eye movements intact  Ears -external ear canals normal  Nose - normal and patent, no erythema, discharge or polyps  Mouth - mucous membranes moist, pharynx normal without lesions  Neck - supple, no significant adenopathy  Lymphatics - no palpable lymphadenopathy, no hepatosplenomegaly  Chest - clear to auscultation, no wheezes, rales or rhonchi, symmetric air entry  Heart - normal rate, regular rhythm, normal S1, S2, no murmurs, rubs, clicks or gallops  Abdomen - soft, nontender, nondistended, no masses or organomegaly  Neurological - alert, oriented, normal speech, no focal findings or movement disorder noted  Slightly decreased to light touch to left forehead and face  Musculoskeletal - no joint tenderness, deformity or swelling  Extremities - peripheral pulses normal, no pedal edema, no clubbing or cyanosis  Skin - normal coloration and turgor, no rashes, no suspicious skin lesions noted    Labs:     Recent Labs   Lab 05/28/20  1124   Sodium 141   Potassium 4.0   Chloride 104   CO2 26   BUN 9   Creatinine 0.8   Calcium 9.2   Albumin 3.7   Protein, Total 6.1   Bilirubin, Total 0.3   Alkaline Phosphatase 75   ALT 12   AST (SGOT) 17   Glucose 96     Recent Labs   Lab 05/28/20  1124   WBC 5.86   Hgb 10.6*   Hematocrit 34.8   Platelets 294           Rads:   Radiological Procedure reviewed.  Radiology Results (24 Hour)     Procedure Component Value Units Date/Time    CT Head WO Contrast [[id number]] Collected: 05/28/20 1244    Order Status: Completed Updated: 05/28/20 1248    Narrative:      CT HEAD WO CONTRAST  CLINICAL INDICATION: Tingling and dizziness    COMPARISON: 05/12/2020    TECHNIQUE:   Imaging was performed from the skull base to the vertex without contrast  administration.  Note: Note that CT scanning at this site  utilizes multiple dose  reduction techniques including automatic exposure control, adjustment of  the MAS and/or KVP according to patient's  size and use of iterative  reconstruction technique      FINDINGS:  Mild generalized parenchymal volume loss is present. The ventricles  are  normal in size and contour with respect to the patient's age. Multiple    lucencies are noted in the subcortical and periventricular white matter  bilaterally. No hemorrhage, mass effect or other acute change is noted      Impression:         No hemorrhage or acute abnormality. Stable chronic changes.    Heron Nay, MD   05/28/2020 12:46 PM          Assessment:       Patient is a 74 year old female admitted with paresthesias ataxia headache suspected migraine  Sinus bradycardia    Plan:     IMCU monitoring  Review outpatient medications  Review CT scan  IV magnesium  Neurology consult appreciated  Monitor clinically      Signed by: Mechele Dawley, MD

## 2020-05-28 NOTE — ED Notes (Signed)
[facility]  ED NURSING NOTE FOR THE RECEIVING INPATIENT NURSE   ED NURSE Gwynn Burly [phone]   ED CHARGE RN Karoline Caldwell   ADMISSION INFORMATION   Emily [name] is a 74 y.o. female admitted with an ED diagnosis of:    1. Dizziness         Isolation: None   Allergies: Morphine, Morphine and related, and Caffeine   Holding Orders confirmed? Yes   Belongings Documented? Yes   Home medications sent to pharmacy confirmed? N/A   NURSING CARE   Patient Comes From:   Mental Status: Home Independent  alert and oriented   ADL: Independent with all ADLs   Ambulation: 1 person assist   Pertinent Information  and Safety Concerns: NIH of 1. pass dsy. screen.     CT / NIH   CT Head ordered on this patient?  Yes     NIH/Dysphagia assessment done prior to admission? Yes   VITAL SIGNS (at the time of this note)      Vitals:    05/28/20 1213   BP: 168/74   Pulse: (!) 58   Resp: 18   Temp: 98.3 F (36.8 C)   SpO2: 99%

## 2020-05-28 NOTE — ED Provider Notes (Signed)
EMERGENCY DEPARTMENT NOTE     ED PHYSICIAN ASSIGNED     Date/Time Event User Comments    05/28/20 1039 Physician Assigned Leslee Home, MD assigned as Attending         ED MIDLEVEL (APP) ASSIGNED     None           HISTORY OF PRESENT ILLNESS   Historian: patient  Translator Used: no    74 y.o. female presents with dizziness (faintness), tingling sensation, tight sensation around her whole body (like covered in wax), headache. Symptoms started this morning when she got up. She was last normal at 11:30 pm last night    1. Location of symptoms: see above  2. Onset of symptoms: this morning, last normal yest 1130 pm  3. What was patient doing when symptoms started (Context): woke with symptoms, didn't recall having this before but apperas to have been worked up for this same constellation of symptoms in April  4. Severity: moderate  5. Timing: acute onset, constant  6. Activities that worsen symptoms: none  7. Activities that improve symptoms: none  8. Quality: dizziness- feels faint, denies vertigo; tingling sensation on entire body but more on left side of body involving head/arm/leg, tight sensation like she is covered in wax on her entire body, headache in top of head  9. Radiation of symptoms: none  10. Associated signs and Symptoms: none   11. Are symptoms worsening? yes          MEDICAL HISTORY     Past Medical History:  Past Medical History:   Diagnosis Date   . Breast cancer 2014    right breast mastectomy   . GERD (gastroesophageal reflux disease)    . Hyperlipidemia    . Hypertension    . Hypothyroidism    . Malignant neoplasm of overlapping sites of right female breast 10/19/2015   . Vertigo        Past Surgical History:  Past Surgical History:   Procedure Laterality Date   . HYSTERECTOMY     . MASTECTOMY Right 2015       Social History:  Social History     Socioeconomic History   . Marital status: Widowed     Spouse name: Not on file   . Number of children: Not on file   . Years of  education: Not on file   . Highest education level: Not on file   Occupational History   . Not on file   Tobacco Use   . Smoking status: Never Smoker   . Smokeless tobacco: Never Used   Vaping Use   . Vaping Use: Never used   Substance and Sexual Activity   . Alcohol use: Yes     Alcohol/week: 1.0 standard drinks     Types: 1 Cans of beer per week     Comment: socially   . Drug use: No   . Sexual activity: Not on file   Other Topics Concern   . Not on file   Social History Narrative   . Not on file     Social Determinants of Health     Financial Resource Strain:    . Difficulty of Paying Living Expenses:    Food Insecurity:    . Worried About Programme researcher, broadcasting/film/video in the Last Year:    . Barista in the Last Year:    Transportation Needs:    . Freight forwarder (Medical):    Marland Kitchen  Lack of Transportation (Non-Medical):    Physical Activity:    . Days of Exercise per Week:    . Minutes of Exercise per Session:    Stress:    . Feeling of Stress :    Social Connections:    . Frequency of Communication with Friends and Family:    . Frequency of Social Gatherings with Friends and Family:    . Attends Religious Services:    . Active Member of Clubs or Organizations:    . Attends Banker Meetings:    Marland Kitchen Marital Status:    Intimate Partner Violence:    . Fear of Current or Ex-Partner:    . Emotionally Abused:    Marland Kitchen Physically Abused:    . Sexually Abused:        Family History:  Family History   Problem Relation Age of Onset   . Breast cancer Neg Hx        Outpatient Medication:  Current Discharge Medication List      CONTINUE these medications which have NOT CHANGED    Details   amLODIPine (NORVASC) 10 MG tablet Take 1 tablet (10 mg total) by mouth daily  Qty: 30 tablet, Refills: 0      anastrozole (ARIMIDEX) 1 MG tablet Take 1 tablet (1 mg total) by mouth daily  Qty: 90 tablet, Refills: 3    Associated Diagnoses: Malignant neoplasm of overlapping sites of right breast in female, estrogen receptor positive       aspirin 81 MG chewable tablet Chew 1 tablet (81 mg total) by mouth daily  Qty: 90 tablet, Refills: 0      desonide (DESOWEN) 0.05 % ointment Apply topically 2 (two) times daily  Qty: 15 g, Refills: 0      ezetimibe (ZETIA) 10 MG tablet Take 1 tablet (10 mg total) by mouth daily  Qty: 90 tablet, Refills: 3    Associated Diagnoses: Pure hypercholesterolemia      lactobacillus/streptococcus (RISAQUAD) Cap Take 1 capsule by mouth daily  Qty: 10 capsule, Refills: 0      levothyroxine (SYNTHROID) 25 MCG tablet Take 1 tablet (25 mcg total) by mouth every morning  Qty: 30 tablet, Refills: 0      lisinopril (ZESTRIL) 40 MG tablet Take 1 tablet (40 mg total) by mouth daily  Qty: 90 tablet, Refills: 3    Associated Diagnoses: Essential hypertension; Dyspepsia      loperamide (IMODIUM) 2 MG capsule Take 1 capsule (2 mg total) by mouth 4 (four) times daily as needed for Diarrhea  Qty: 10 capsule, Refills: 0      meclizine (ANTIVERT) 25 MG tablet Take 1 tablet (25 mg total) by mouth 3 (three) times daily as needed for Dizziness  Qty: 10 tablet, Refills: 0      Multiple Vitamins-Minerals (MULTIVITAMIN WITH MINERALS) tablet Take 1 tablet by mouth every morning          pantoprazole (PROTONIX) 40 MG tablet Take 1 tablet (40 mg total) by mouth every morning before breakfast  Qty: 30 tablet, Refills: 0      rosuvastatin (CRESTOR) 40 MG tablet Take 0.5 tablets (20 mg total) by mouth daily  Qty: 30 tablet, Refills: 0    Associated Diagnoses: Essential hypertension; Mixed hyperlipidemia      vitamin B-1 (THIAMINE) 100 MG tablet Take 100 mg by mouth every morning.             Allergies:  Allergies   Allergen Reactions   .  Morphine Itching   . Morphine And Related Itching and Nausea And Vomiting     dizzy   . Caffeine      Personal preference       REVIEW OF SYSTEMS   Review of Systems   Neurological: Positive for dizziness, tingling, sensory change and headaches. Negative for speech change, focal weakness, seizures, loss of  consciousness and weakness.   All other systems reviewed and are negative.        PHYSICAL EXAM     Vitals:    05/28/20 1213   BP: 168/74   Pulse: (!) 58   Resp: 18   Temp: 98.3 F (36.8 C)   SpO2: 99%       Nursing note and vitals reviewed.    Constitutional: non-toxic  Head: Atraumatic.  Eyes: PERRL. EOMI. No scleral icterus.  ENT: Mucous membranes are moist and intact. Oropharynx is clear. Patent airway.  Neck: Supple. No cervical lymphadenopathy.  Cardiovascular: Regular rate. Regular rhythm. No murmurs, rubs, or gallops.  Pulmonary/Chest: No evidence of respiratory distress. Clear to auscultation bilaterally. No wheezing, rales or rhonchi.   GI: Soft, non-distended abdomen. No tenderness to palpation of abdomen.  Extremities: No edema. No deformity.  Skin: No rash.   Neurological: Awake, alert and oriented x 3. CN II-XII intact. Strength intact. Sensation intact.  Psychiatric: Appropriate affect. Appropriate mood. Appropriate behavior.    MEDICAL DECISION MAKING   Dizziness described as faintness. ekg to rule out acs, arrhythmia. Trop to rule out acs. Cbc to rule out anemia, infectious etiology. Metabolic panel to rule out renal failure, problem with lytes  Tingling sensation on entire body but worse on left. Subjective numbness on left side of body on exam. Has been worked up for this before. I reviewed neuro consult note form 01/2020. Had negative mri, mra head/neck. Ct head to be obtained here to rule out stroke. Doubt we will find this. Doubt any new pathology since prior work-up.    Discharge Instructions:    DISCUSSION      Vital Signs: Reviewed the patient's vital signs.   Nursing Notes: Reviewed and utilized available nursing notes.  Medical Records Reviewed: Reviewed available past medical records.  Counseling: The emergency provider has spoken with the patient and discussed today's findings, in addition to providing specific details for the plan of care.  Questions are answered and there is agreement  with the plan.    IMAGING STUDIES    The following imaging studies were reviewed by the Emergency Medicine Physician.  For full imaging study results please see chart.    CT Head WO Contrast   Final Result       No hemorrhage or acute abnormality. Stable chronic changes.      Heron Nay, MD    05/28/2020 12:46 PM          CARDIAC STUDIES     The following cardiac studies were independently interpreted by the Emergency Medicine Physician. For full cardiac study results please see chart     EKG Interpretation:   Signed and interpreted by ED Physician   Time Interpreted: 1039  Comparison:   Rate: 71  Rhythm: nsr   Axis:    Intervals: normal  Blocks: none  ST segments: No acute changes.   Interpretation: Normal EKG    PULSE OXIMETRY    Oxygen Saturation by Pulse Oximetry: 99% ra  Interventions: none  Interpretation: normal    EMERGENCY DEPT. MEDICATIONS      ED Medication  Orders (From admission, onward)    Start Ordered     Status Ordering Provider    05/28/20 1451 05/28/20 1450  magnesium sulfate 1g in dextrose 5% IVPB (premix)  2 times daily     Route: Intravenous  Ordered Dose: 1 g     Acknowledged KHOSLA, JASWINDER S    05/28/20 1319 05/28/20 1318  magnesium sulfate 1g in dextrose 5% IVPB (premix)  Once     Route: Intravenous  Ordered Dose: 1 g     Last MAR action: New Bag Onur Mori W          LABORATORY RESULTS    Ordered and independently interpreted AVAILABLE laboratory tests. Please see results section in chart for full details.  Results for orders placed or performed during the hospital encounter of 05/28/20   COVID-19 (SARS-COV-2) (No Name Rapid)    Specimen: Nasopharynx; Nasopharyngeal Swab   Result Value Ref Range    Purpose of COVID testing Screening     SARS-CoV-2 Specimen Source Nasopharyngeal     SARS CoV 2 Overall Result Negative    CBC and differential   Result Value Ref Range    WBC 5.86 3.1 - 9.5 x10 3/uL    Hgb 10.6 (L) 11.4 - 14.8 g/dL    Hematocrit 16.1 09.6 - 43.7 %     Platelets 294 142 - 346 x10 3/uL    RBC 3.94 3.90 - 5.10 x10 6/uL    MCV 88.3 78.0 - 96.0 fL    MCH 26.9 25.1 - 33.5 pg    MCHC 30.5 (L) 31 - 35 g/dL    RDW 14 04.5 - 40.9 %    MPV 11.3 8.9 - 12.5 fL    Neutrophils 56.4 None %    Lymphocytes Automated 31.9 None %    Monocytes 9.4 None %    Eosinophils Automated 1.5 None %    Basophils Automated 0.5 None %    Immature Granulocytes 0.3 None %    Nucleated RBC 0.0 0.0 - 0.0 /100 WBC    Neutrophils Absolute 3.30 1 - 6 x10 3/uL    Lymphocytes Absolute Automated 1.87 0.42 - 3.22 x10 3/uL    Monocytes Absolute Automated 0.55 0.21 - 0.85 x10 3/uL    Eosinophils Absolute Automated 0.09 0.00 - 0.44 x10 3/uL    Basophils Absolute Automated 0.03 0.00 - 0.08 x10 3/uL    Immature Granulocytes Absolute 0.02 0.00 - 0.07 x10 3/uL    Absolute NRBC 0.00 0.00 - 0.00 x10 3/uL   Comprehensive metabolic panel   Result Value Ref Range    Glucose 96 70 - 100 mg/dL    BUN 9 7 - 19 mg/dL    Creatinine 0.8 0.6 - 1.0 mg/dL    Sodium 811 914 - 782 mEq/L    Potassium 4.0 3.5 - 5.1 mEq/L    Chloride 104 100 - 111 mEq/L    CO2 26 22 - 29 mEq/L    Calcium 9.2 7.9 - 10.2 mg/dL    Protein, Total 6.1 6.0 - 8.3 g/dL    Albumin 3.7 3.5 - 5.0 g/dL    AST (SGOT) 17 5 - 34 U/L    ALT 12 0 - 55 U/L    Alkaline Phosphatase 75 37 - 106 U/L    Bilirubin, Total 0.3 0.2 - 1.2 mg/dL    Globulin 2.4 2.0 - 3.6 g/dL    Albumin/Globulin Ratio 1.5 0.9 - 2.2    Anion Gap 11.0 5 -  15   Prothrombin time/INR   Result Value Ref Range    PT 11.6 10.1 - 12.9 sec    PT INR 1.0 0.9 - 1.1   APTT   Result Value Ref Range    PTT 37 27 - 39 sec   Troponin I   Result Value Ref Range    Troponin I <0.01 0.00 - 0.05 ng/mL   GFR   Result Value Ref Range    EGFR >60.0    Glucose Whole Blood - POCT   Result Value Ref Range    Whole Blood Glucose POCT 103 (H) 70 - 100 mg/dL   ECG 12 Lead   Result Value Ref Range    Ventricular Rate 71 BPM    Atrial Rate 71 BPM    P-R Interval 180 ms    QRS Duration 88 ms    Q-T Interval 390 ms    QTC  Calculation (Bezet) 423 ms    P Axis 48 degrees    R Axis 25 degrees    T Axis 14 degrees       ATTESTATIONS      Physician Attestation: Darlyn Read MD, have been the primary provider for [name] during this Emergency Dept visit and have reviewed the chart for accuracy and agree with its content.     DIAGNOSIS      Diagnosis:  Final diagnoses:   Dizziness       Disposition:  ED Disposition     ED Disposition Condition Date/Time Comment    Observation  Thu May 28, 2020  1:19 PM Admitting Physician: Hoy Finlay P [[id number]]   Service:: Medicine [[id number]]   Estimated Length of Stay: < 2 midnights   Tentative Discharge Plan?: Home or Self Care [1]   Does patient need telemetry?: Yes   Telemetry type (separate Telemetry order is also required):: Cardiac telemetry            Prescriptions:  Current Discharge Medication List      CONTINUE these medications which have NOT CHANGED    Details   amLODIPine (NORVASC) 10 MG tablet Take 1 tablet (10 mg total) by mouth daily  Qty: 30 tablet, Refills: 0      anastrozole (ARIMIDEX) 1 MG tablet Take 1 tablet (1 mg total) by mouth daily  Qty: 90 tablet, Refills: 3    Associated Diagnoses: Malignant neoplasm of overlapping sites of right breast in female, estrogen receptor positive      aspirin 81 MG chewable tablet Chew 1 tablet (81 mg total) by mouth daily  Qty: 90 tablet, Refills: 0      desonide (DESOWEN) 0.05 % ointment Apply topically 2 (two) times daily  Qty: 15 g, Refills: 0      ezetimibe (ZETIA) 10 MG tablet Take 1 tablet (10 mg total) by mouth daily  Qty: 90 tablet, Refills: 3    Associated Diagnoses: Pure hypercholesterolemia      lactobacillus/streptococcus (RISAQUAD) Cap Take 1 capsule by mouth daily  Qty: 10 capsule, Refills: 0      levothyroxine (SYNTHROID) 25 MCG tablet Take 1 tablet (25 mcg total) by mouth every morning  Qty: 30 tablet, Refills: 0      lisinopril (ZESTRIL) 40 MG tablet Take 1 tablet (40 mg total) by mouth daily  Qty: 90 tablet, Refills: 3     Associated Diagnoses: Essential hypertension; Dyspepsia      loperamide (IMODIUM) 2 MG capsule Take 1 capsule (2 mg total) by mouth  4 (four) times daily as needed for Diarrhea  Qty: 10 capsule, Refills: 0      meclizine (ANTIVERT) 25 MG tablet Take 1 tablet (25 mg total) by mouth 3 (three) times daily as needed for Dizziness  Qty: 10 tablet, Refills: 0      Multiple Vitamins-Minerals (MULTIVITAMIN WITH MINERALS) tablet Take 1 tablet by mouth every morning          pantoprazole (PROTONIX) 40 MG tablet Take 1 tablet (40 mg total) by mouth every morning before breakfast  Qty: 30 tablet, Refills: 0      rosuvastatin (CRESTOR) 40 MG tablet Take 0.5 tablets (20 mg total) by mouth daily  Qty: 30 tablet, Refills: 0    Associated Diagnoses: Essential hypertension; Mixed hyperlipidemia      vitamin B-1 (THIAMINE) 100 MG tablet Take 100 mg by mouth every morning.                    Georgana Curio, MD  05/28/20 1531

## 2020-05-28 NOTE — EDIE (Signed)
COLLECTIVE?NOTIFICATION?05/28/2020 10:31?Emily [name], Emily [name]?MRN: [id number]    [place] [facility]'s patient encounter information:   VHQ:?[id number]  Account [id number]  Billing Account [id number]      Criteria Met      5 ED Visits in 12 Months    Security and Safety  No recent Security Events currently on file    ED Care Guidelines  There are currently no ED Care Guidelines for this patient. Please check your facility's medical records system.    Flags      Negative COVID-19 Lab Result - [facility] - A specimen collected from this patient was negative for COVID-19 / Attributed By: [place] Department of Health / Attributed On: 05/14/2020       Prescription Monitoring Program  000??- Narcotic Use Score  000??- Sedative Use Score  000??- Stimulant Use Score  000??- Overdose Risk Score  - All Scores range from 000-999 with 75% of the population scoring < 200 and on 1% scoring above 650  - The last digit of the narcotic, sedative, and stimulant score indicates the number of active prescriptions of that type  - Higher Use scores correlate with increased prescribers, pharmacies, mg equiv, and overlapping prescriptions  - Higher Overdose Risk Scores correlate with increased risk of unintentional overdose death   Concerning or unexpectedly high scores should prompt a review of the PMP record; this does not constitute checking PMP for prescribing purposes.      E.D. Visit Count (12 mo.)  Facility Visits   [place] [facility] 8   Total 8   Note: Visits indicate total known visits.     Recent Emergency Department Visit Summary  Date Facility [facility] Type Diagnoses or Chief Complaint   May 28, 2020 [place] [facility] [place] [facility]      vaginal/rectal bleeding/dizziness      May 12, 2020 [place] [facility] [place] [facility]      Chest Pain,Tingling,Weak      Tingling      Headache      Chest Pain      Back Pain      Chest pain, unspecified      Mastodynia      Headache, unspecified       Weakness      Paresthesia of skin      May 10, 2020 [place] [facility] [place] [facility]      weakness. lump by lt ear      Fatigue      Chest Pain      Generalized weakness      Pruritus, unspecified      Other chest pain      Mar 10, 2020 [place] [facility] [place] [facility]      abd pain      Generalized weakness      Abdominal Pain      Contact with and (suspected) exposure to covid-19      Mar 08, 2020 [place] [facility] [place] [facility]      fatige,      fatige, weakness      Breast Pain      Other chest pain      Chest pain, unspecified      Other specified anxiety disorders      Acquired absence of right breast and nipple      Jan 27, 2020 [place] [facility] [place] [facility]  possible stroke      Numbness      Dec 28, 2019 [place] [facility] [place] [facility]      both breast pain      Chest Pain      Mastodynia      Secondary hypertension, unspecified      Aug 08, 2019 [place] [facility] [place] [facility] [place] [facility]      fall [id number]/facial injuries      Fall      Contusion of other part of head, initial encounter      Cervicalgia      Pleurodynia      Fall (on) (from) other stairs and steps, initial encounter      Pain in right shoulder          Recent Inpatient Visit Summary  Date Facility [facility] Type Diagnoses or Chief Complaint   Mar 10, 2020 [place] [facility] [place] [facility]      Contact with and (suspected) exposure to covid-19      Noninfective gastroenteritis and colitis, unspecified      Jan 27, 2020 [place] [facility]      Weakness          Care Team  Provider Specialty Phone Fax Service Dates   DAVIDSON, Janet Berlin MD M, MD Family Medicine: Adult Medicine [phone] Current      Collective Portal  This patient has registered at the [facility] Emergency Department   For more information visit: [email or web address]      PLEASE NOTE:     1.   Any care recommendations and other clinical information are provided as guidelines or for historical purposes only, and providers should exercise their own clinical judgment when providing care.    2.   You may only use this information for purposes of treatment, payment or health care operations activities, and subject to the limitations of applicable Collective Policies.    3.   You should consult directly with the organization that provided a care guideline or other clinical history with any questions about additional information or accuracy or completeness of information provided.    ? 2021 [place], [employer] - [email or web address]

## 2020-05-29 LAB — HEMOGLOBIN A1C
Average Estimated Glucose: 102.5 mg/dL
Hemoglobin A1C: 5.2 % (ref 4.6–5.9)

## 2020-05-29 LAB — LIPID PANEL
Cholesterol / HDL Ratio: 3.6
Cholesterol: 213 mg/dL — ABNORMAL HIGH (ref 0–199)
HDL: 60 mg/dL (ref 40–9999)
LDL Calculated: 135 mg/dL — ABNORMAL HIGH (ref 0–99)
Triglycerides: 91 mg/dL (ref 34–149)
VLDL Calculated: 18 mg/dL (ref 10–40)

## 2020-05-29 LAB — BASIC METABOLIC PANEL
Anion Gap: 12 (ref 5.0–15.0)
BUN: 7 mg/dL (ref 7–19)
CO2: 25 mEq/L (ref 22–29)
Calcium: 9.3 mg/dL (ref 7.9–10.2)
Chloride: 105 mEq/L (ref 100–111)
Creatinine: 0.8 mg/dL (ref 0.6–1.0)
Glucose: 92 mg/dL (ref 70–100)
Potassium: 4.5 mEq/L (ref 3.5–5.1)
Sodium: 142 mEq/L (ref 136–145)

## 2020-05-29 LAB — MAGNESIUM: Magnesium: 2.4 mg/dL (ref 1.6–2.6)

## 2020-05-29 LAB — GFR: EGFR: >60

## 2020-05-29 MED ORDER — MAGNESIUM OXIDE 400 MG TABS (WRAP)
400.0000 mg | ORAL_TABLET | Freq: Every day | ORAL | 0 refills | Status: DC
Start: 2020-05-29 — End: 2022-05-12

## 2020-05-29 MED ORDER — MAGNESIUM SULFATE IN D5W 1-5 GM/100ML-% IV SOLN
1.0000 g | Freq: Once | INTRAVENOUS | Status: AC
Start: 2020-05-29 — End: 2020-05-29
  Administered 2020-05-29: 1 g via INTRAVENOUS
  Filled 2020-05-29: qty 100

## 2020-05-29 NOTE — Progress Notes (Signed)
Per MD [facility] pending PT/OT evals and after iv magnesium complete.

## 2020-05-29 NOTE — Progress Notes (Addendum)
Date Time: 05/29/20 11:59 AM  Patient Name: Emily [name]  Attending Physician: Mechele Dawley, MD  Patient Class: Observation  Hospital Day: 0            NEUROLOGY PROGRESS NOTE             Assessment/Plan   Dizziness gait ataxia tingling numbness associated with a headache currently all symptoms resolved after IV magnesium, likely migraine phenomena patient with multiple similar events in the past with negative work-up.    Hemoglobin A1c five-point    P.o. magnesium oxide 400 mg daily as headache preventive  Will give another dose of IV magnesium hopefully she can be discharged later today  Outpatient follow-up with [place] in the office.---> Information provided      Subjective:   Patient Seen and Examined.  Still has a mild headache tingling numbness paresthesias all resolved    Review of Systems:   No headache, eye, ear nose, throat problems; no coughing or wheezing or shortness of breath, No chest pain or orthopnea, no abdominal pain, nausea or vomiting, No pain in the body or extremities, no psychiatric, neurological, endocrine, hematological or cardiac complaints except as noted above.           Physical Exam:   BP 121/76   Pulse 89   Temp 98.1 F (36.7 C) (Oral)   Resp 18   Ht 1.6 m (5\' 3" )   Wt 71.9 kg (158 lb 8.2 oz)   SpO2 100%   BMI 28.08 kg/m   Awake alert oriented x3 speech fluent smile symmetric moving both arms and legs well pupils equal briskly reactive  Meds:      Scheduled Meds: PRN Meds:    amLODIPine, 10 mg, Oral, Daily  anastrozole, 1 mg, Oral, Daily  aspirin, 81 mg, Oral, Daily  ezetimibe, 10 mg, Oral, Daily  heparin (porcine), 5,000 Units, Subcutaneous, Q12H [facility]  lactobacillus/streptococcus, 1 capsule, Oral, Daily  levothyroxine, 25 mcg, Oral, QAM  lisinopril, 40 mg, Oral, Daily  magnesium sulfate, 1 g, Intravenous, BID  pantoprazole, 40 mg, Oral, QAM AC  rosuvastatin, 20 mg, Oral, Daily  thiamine, 100 mg, Oral, QAM        Continuous Infusions:   acetaminophen, 650 mg, Q6H PRN    Or  acetaminophen, 650 mg, Q6H PRN  dextrose, 15 g of glucose, PRN   And  dextrose, 12.5 g, PRN   And  glucagon (rDNA), 1 mg, PRN  loperamide, 2 mg, 4X Daily PRN  magnesium hydroxide, 30 mL, Daily PRN  meclizine, 25 mg, TID PRN  melatonin, 3 mg, QHS PRN  naloxone, 0.2 mg, PRN                Labs:     Recent Labs   Lab 05/29/20  0516 05/28/20  1124   Glucose 92 96   BUN 7 9   Creatinine 0.8 0.8   Calcium 9.3 9.2   Sodium 142 141   Potassium 4.5 4.0   Chloride 105 104   CO2 25 26   Albumin  --  3.7   Magnesium 2.4  --    AST (SGOT)  --  17   ALT  --  12   Bilirubin, Total  --  0.3   Alkaline Phosphatase  --  75     Recent Labs   Lab 05/28/20  1124   WBC 5.86   Hgb 10.6*   Hematocrit 34.8   MCV 88.3   MCH 26.9  MCHC 30.5*   Platelets 294         Recent Labs     05/28/20  1137   PTT 37   PT 11.6   PT INR 1.0          Radiology Results (24 Hour)     ** No results found for the last 24 hours. **           All brain imaging (MRI, CT) personally reviewed.    Case discussed with: Patient      This note was generated by the Epic EMR system/ Dragon speech recognition and may contain inherent errors or omissions not intended by the user. Grammatical errors, random word insertions, deletions and pronoun errors  are occasional consequences of this technology due to software limitations.     Not all errors are caught or corrected. If there are questions or concerns about the content of this note or information contained within the body of this dictation they should be addressed directly with the author for clarification.      Signed by: Cathe Mons, MD  Spectralink: [id number]      Answering Service: [phone]

## 2020-05-29 NOTE — Discharge Instr - AVS First Page (Addendum)
Reason for your Hospital Admission:  Migraine        Instructions for after your discharge  Follow up with primary md and neurology

## 2020-05-29 NOTE — Plan of Care (Signed)
The learning abilities of the patient and/or caregiver have been assessed. Today's individualized plan of care includes stroke education, q4h nuero assessment, NIH assessment, closely monitoring vitals.  The plan of care was discussed with the patient and/or caregiver, who agrees to it and demonstrates understanding of the disease process, risk factors, treatment plan, medications and consequences of noncompliance. All questions and concerns were addressed.             Problem: Safety  Goal: Patient will be free from injury during hospitalization  Outcome: Progressing  Flowsheets (Taken 05/29/2020 0315)  Patient will be free from injury during hospitalization:   Assess patient's risk for falls and implement fall prevention plan of care per policy   Provide and maintain safe environment   Use appropriate transfer methods   Ensure appropriate safety devices are available at the bedside   Assess for patients risk for elopement and implement Elopement Risk Plan per policy   Include patient/ family/ care giver in decisions related to safety   Hourly rounding     Problem: Pain  Goal: Pain at adequate level as identified by patient  Outcome: Progressing  Flowsheets (Taken 05/29/2020 0315)  Pain at adequate level as identified by patient:   Identify patient comfort function goal   Assess pain on admission, during daily assessment and/or before any "as needed" intervention(s)   Evaluate if patient comfort function goal is met   Offer non-pharmacological pain management interventions   Reassess pain within 30-60 minutes of any procedure/intervention, per Pain Assessment, Intervention, Reassessment (AIR) Cycle   Include patient/patient care companion in decisions related to pain management as needed     Problem: Every Day - Stroke  Goal: Core/Quality measure requirements - Daily  Outcome: Progressing  Flowsheets (Taken 05/29/2020 0315)  Core/Quality measure requirements - Daily:   VTE Prevention: Ensure anticoagulant(s)  administered and/or anti-embolism stockings/devices documented by end of day 2   Once lipid panel has resulted, check LDL. Contact provider for statin order if LDL > 70 (or ensure contraindication documented by LIP).   Continue stroke education (must include Modifiable Risk Factors, Warning Signs and Symptoms of Stroke, Activation of Emergency Medical System and Follow-up Appointments). Ensure handout has been given and documented.   Ensure antithrombotic administered or contraindication documented by LIP by end of day 2  Goal: Neurological status is stable or improving  Outcome: Progressing  Flowsheets (Taken 05/29/2020 0315)  Neurological status is stable or improving:   Monitor/assess/document neurological assessment (Stroke: every 4 hours)   Monitor/assess NIH Stroke Scale   Perform CAM Assessment   Observe for seizure activity and initiate seizure precautions if indicated   Re-assess NIH Stroke Scale for any change in status  Goal: Stable vital signs and fluid balance  Outcome: Progressing  Flowsheets (Taken 05/29/2020 0315)  Stable vital signs and fluid balance:   Monitor and assess vitals every 4 hours or as ordered and hemodynamic parameters   Position patient for maximum circulation/cardiac output   Monitor intake and output. Notify LIP if urine output is < 30 mL/hour.   Apply telemetry monitor as ordered   Encourage oral fluid intake

## 2020-05-29 NOTE — Progress Notes (Signed)
Patient is alert and oriented x4. Denies pain and dizziness.  NIH=1 for decreased sensation to the LUE, which improved and LLE tingling.   Lungs clear, pt on RA. NSR/SB (HR 45-61 bpm) on tele.    Plan of care reviewed with pt and followed as ordered.

## 2020-05-29 NOTE — Progress Notes (Signed)
PROGRESS NOTE    Date Time: 05/29/20 7:47 AM  Patient Name: Emily [name], Emily [name]      Subjective:     Patient awake feeling better no migraine this morning      Review of Systems:        Patient reports feeling of tightness and paresthesias have resolved      Physical Exam:     Vitals:    05/29/20 0420   BP: 100/58   Pulse: (!) 57   Resp: 17   Temp:    SpO2:        General appearance -age-appropriate  Mental status - alert, oriented to person, place, and time  Eyes - pupils equal and reactive, extraocular eye movements intact  Nose - normal and patent, no erythema, discharge or polyps  Mouth - mucous membranes moist, pharynx normal without lesions  Neck - supple, no significant adenopathy  Lymphatics - no palpable lymphadenopathy, no hepatosplenomegaly  Chest - clear to auscultation, no wheezes, rales or rhonchi, symmetric air entry  Heart - normal rate, regular rhythm, normal S1, S2, no murmurs, rubs, clicks or gallops  Abdomen - soft, nontender, nondistended, no masses or organomegaly  Neurological - alert, oriented, normal speech, no focal findings or movement disorder noted  Musculoskeletal - no joint tenderness, deformity or swelling  Extremities - peripheral pulses normal, no pedal edema, no clubbing or cyanosis  Skin - normal coloration and turgor, no rashes, no suspicious skin lesions noted    Medications:     Current Facility-Administered Medications   Medication Dose Route Frequency   . amLODIPine  10 mg Oral Daily   . anastrozole  1 mg Oral Daily   . aspirin  81 mg Oral Daily   . ezetimibe  10 mg Oral Daily   . heparin (porcine)  5,000 Units Subcutaneous Q12H [facility]   . lactobacillus/streptococcus  1 capsule Oral Daily   . levothyroxine  25 mcg Oral QAM   . lisinopril  40 mg Oral Daily   . magnesium sulfate  1 g Intravenous BID   . pantoprazole  40 mg Oral QAM AC   . rosuvastatin  20 mg Oral Daily   . thiamine  100 mg Oral QAM       Intake and Output Summary (Last 24 hours) at Date Time    Intake/Output Summary  (Last 24 hours) at 05/29/2020 0747  Last data filed at 05/28/2020 2000  Gross per 24 hour   Intake 240 ml   Output --   Net 240 ml           Labs:     Recent Labs   Lab 05/29/20  0516 05/28/20  1124   Sodium 142 141   Potassium 4.5 4.0   Chloride 105 104   CO2 25 26   BUN 7 9   Creatinine 0.8 0.8   Calcium 9.3 9.2   Albumin  --  3.7   Protein, Total  --  6.1   Bilirubin, Total  --  0.3   Alkaline Phosphatase  --  75   ALT  --  12   AST (SGOT)  --  17   Glucose 92 96     Recent Labs   Lab 05/28/20  1124   WBC 5.86   Hgb 10.6*   Hematocrit 34.8   Platelets 294               Rads:     Radiology Results (24 Hour)  Procedure Component Value Units Date/Time    CT Head WO Contrast [[id number]] Collected: 05/28/20 1244    Order Status: Completed Updated: 05/28/20 1248    Narrative:      CT HEAD WO CONTRAST  CLINICAL INDICATION: Tingling and dizziness    COMPARISON: 05/12/2020    TECHNIQUE:   Imaging was performed from the skull base to the vertex without contrast  administration.  Note: Note that CT scanning at this site  utilizes multiple dose  reduction techniques including automatic exposure control, adjustment of  the MAS and/or KVP according to patient's size and use of iterative  reconstruction technique      FINDINGS:  Mild generalized parenchymal volume loss is present. The ventricles  are  normal in size and contour with respect to the patient's age. Multiple    lucencies are noted in the subcortical and periventricular white matter  bilaterally. No hemorrhage, mass effect or other acute change is noted      Impression:         No hemorrhage or acute abnormality. Stable chronic changes.    Heron Nay, MD   05/28/2020 12:46 PM            Assessment:     Patient is a 74 year old female admitted with paresthesias ataxia headache suspected migraine  Sinus bradycardia      Plan:     [facility] monitoring  CT scan reviewed  Status post IV magnesium  OT PT eval  Dissipate discharge to home later today        Signed by: Mechele Dawley, MD  05/29/2020  7:47 AM

## 2020-05-29 NOTE — Progress Notes (Signed)
The learning abilities of the patient and/or caregiver have been assessed. Today's individualized plan of care includes neuro checks every 4 hours, be evaluated by OT and walk with PT. The patient or caregiver states the following personal goal related to the patient's deficit(s): "By discharge I want to be able to walk and not feel dizzy." The plan of care was discussed with the patient and/or caregiver, who agrees to it and demonstrates understanding of the disease process, risk factors, treatment plan, medications and consequences of noncompliance. All questions and concerns were addressed.

## 2020-05-29 NOTE — SLP Progress Note (Signed)
Speech Language Pathology    Consult received per CVA protocol, chart review completed. CT does not reveal acute process & stable chronic changes. Bedside swallow evaluation deferred as patient passed RN dysphagia screen and has tolerated meals today w/o difficulty. Pt denies deficits with respect to expressive/receptive language and reports cognition is at baseline. RN endorses pt's report. No skilled SLP services indicated.     March Rummage, M.S. CCC-SLP  Phone: [phone]  05/29/2020

## 2020-05-29 NOTE — OT Eval Note (Addendum)
[facility]  [address]  [place], [place] [address]  [phone]    Occupational Therapy Evaluation    Patient: Emily [name] MRN: [id number]   Unit: [facility] INTERMEDIATE CARE Bed: MI626/MI626-01    Time of treatment: Time Calculation  OT Received On: 05/29/20  Start Time: 1504  Stop Time: 1512  Time Calculation (min): 8 min    Consult received for Emily [name] for OT evaluation and treatment.  Patient's medical condition is appropriate for Occupational Therapy  intervention at this time.    Interpreter utilized: no, not indicated    D/C Suggestions   Based on today's performance, recommended discharge is to Home no OT needs.  Equipment recommended for discharge: No equipment needs  Transport Recommendations: no limitations    Discharge recommendations are based on patient's progression/regression. Please see most recent note for updated discharge recommendations.    Assessment     Emily [name] is a 74 y.o. female admitted 05/28/2020 with dizziness, n/t sensation throughout body, symptoms now resolved. CT negative, MRI deferred per neurology as pt has had similar presentations in the past and workup non revealing. Pt lives alone and is indep with ADLs and IADLs at baseline. Pt received for OT eval preparing for d/c at EOB. She is indep to retrieve clothing in room no AD and for UB/LB dressing. Pt at baseline, no OT needs.     Brief chart review completed including review of labs review of imaging review of vitals.  Pt's ability to complete ADLs and functional transfers is at/near fxn'l baseline. Pt does not have any acute OT needs at this time. D/C OT.         Complexity Chart Review Performance Deficits Clinical Decision Making Hx/Comorbidities Assistance needed   Low  Brief  1-3 Limited options None None (or at baseline)       PMP - Progressive Mobility Protocol   PMP Activity: Step 6 - Walks in Room  Distance Walked (ft) (Step 6,7): 10 Feet     Rehabilitation Potential: n/a    Interdisciplinary  Communication: discussed with PT/RN    Plan     OT Plan  Risks/Benefits/POC Discussed with Pt/Family: With patient  Treatment Interventions: No skilled interventions needed at this time  Discharge Recommendation: Home with supervision  DME Recommended for Discharge: No additional equipment/DME recommended at this time  OT Frequency Recommended: one time visit - therapy discontinued         Medical Diagnosis: Dizziness [R42]    History of Present Illness: Emily [name] is a 74 y.o. female admitted on  05/28/2020 with "dizziness (faintness), tingling sensation, tight sensation around her whole body (like covered in wax), headache. Symptoms started this morning when she got up. She was last normal at 11:30 pm last night"      Patient Active Problem List   Diagnosis   . Malignant neoplasm of overlapping sites of right female breast   . Hypertension   . Mixed hyperlipidemia   . Hypothyroid   . Right sided numbness   . Chest discomfort   . SOB (shortness of breath)   . Left sided numbness   . Left-sided weakness   . COVID-19 ruled out by laboratory testing   . Acute colitis   . Dizziness     Past Medical History:   Diagnosis Date   . Breast cancer 2014    right breast mastectomy   . GERD (gastroesophageal reflux disease)    . Hyperlipidemia    .  Hypertension    . Hypothyroidism    . Malignant neoplasm of overlapping sites of right female breast 10/19/2015   . Vertigo      Past Surgical History:   Procedure Laterality Date   . HYSTERECTOMY     . MASTECTOMY Right 2015         X-Rays/Tests/Labs:  Lab Results   Component Value Date/Time    HGB 10.6 (L) 05/28/2020 11:24 AM    HCT 34.8 05/28/2020 11:24 AM    K 4.5 05/29/2020 05:16 AM    NA 142 05/29/2020 05:16 AM    INR 1.0 05/28/2020 11:37 AM    TROPI <0.01 05/28/2020 11:24 AM    TROPI <0.01 05/12/2020 12:47 PM    TROPI <0.01 05/10/2020 12:45 PM    TROPI <0.01 03/08/2020 04:23 PM    TROPI 0.01 07/15/2009 09:40 AM     CT Head WO Contrast (Order: [id number]) -  05/28/2020  IMPRESSION:    No hemorrhage or acute abnormality. Stable chronic changes.    Social History:  Lives alonein an apartment.  Entry Steps: 1 flight, no elevator accessRails: yesInside steps: 0Rails: 0  Equipment at home: tub/shower combination  Prior Level of Function: indep all ADL/IADL, no DME/AE at home, cooks, drives  Cognition: WFL  Mobility: independent  Feeding: independent  Grooming: independent  Bathing: independent  Dressing: independent  Toileting: independent    Subjective   "I just turned 74"  Patient is agreeable to participation in the therapy session. Nursing clears patient for therapy.  Patient's Goal:  To go home  Pain: pt denies pain    Objective     Precautions:   Precautions  Weight Bearing Status: no restrictions  Other Precautions: falls    Patient is in bed with  No Medical Equipment  in place.       Observation of patient/vitals:   Vitals:    05/29/20 0756 05/29/20 0758 05/29/20 0759 05/29/20 1200   BP: 133/72 125/72 121/76 114/68   Pulse: (!) 56 64 89 (!) 54   Resp: 18   16   Temp: 98.1 F (36.7 C)   98.2 F (36.8 C)   TempSrc: Oral   Oral   SpO2: 100%   100%   Weight:       Height:           Orientation/Cognition:     Alert and Oriented x 4  Cognition: WFL      Musculoskeletal Examination:     ROM Strength   Neck/ Trunk WFL WFL   RUE WFL Shoulder flex/abd 4-/5  Elbow f/e and gross grasp 4/5   LUE WFL Grossly 4/5     Sensation: Intact to light touch, denies numbness/tingling throughout B UE's.   Coordination: Intact gross motor and serial opposition to B hands    Vision: WFL with glasses  Hearing: WFL      Functional Mobility:  Sit to stand: indep  Stand to sit: indep  Transfers: STS  Ambulation: 10' in room no AD indep    Balance:  Static Sit Balance: good  Dynamic Sit Balance: good  Static Stand Balance: good  Dynamic Stand Balance: good    Self Care:  UB Dressing: indep don bra,  camisole, overhead [place]  LB Dressing: indep don shorts, doff socks, don slip on shoes    Endurance: not challenged    Participation:  good    Education:  Educated the patient to role of occupational therapy, plan of care, goals  of therapy, rationale for progressing mobility and safety with mobility and ADLs and home safety.    RN notified of session outcome and that patient was left sitting EOB with nsg with all needs met and equipment intact.   Safety measures include: handoff to nurse/clin tech/ unit secretary completed.   Mobility and ADL status posted at bedside and within E.M.R.                  Modified Rankin Scale   I am certified in mRS: Yes  mRS Assessment Source: Patient  mRS Value: 1 - No significant disability      Therapist PPE during session procedural mask, goggles  and gloves      Signature:   Adolm Joseph, OT  05/29/2020  3:30 PM  Phone: [phone]    (For scheduling questions, please contact rehab tech [phone])    Attention MD:   Thank you for allowing [place] to participate in the care of Emily [name]. Regulations from the [employer] Medicare and Medicaid Services (CMS) require your review and approval of this plan of care.     Please cosign this note indicating you are in agreement with theTherapy Plan of Care so we may initiate the therapy treatment plan.

## 2020-05-29 NOTE — Progress Notes (Signed)
Per MD  home pending PT/OT evals and after iv magnesium  complete.     CM notified HH liaison to arrange [facility] services.       05/29/20 1412   Discharge Disposition   Patient preference/choice provided? Yes   Physical Discharge Disposition Home, Home Health   Mode of Transportation Car   Patient/Family/POA notified of transfer plan Yes   Patient agreeable to discharge plan/expected d/c date? Yes   Family/POA agreeable to discharge plan/expected d/c date? Yes   Bedside nurse notified of transport plan? Yes   Outpatient Services   Home Health Skilled Nursing;Home PT/OT/ST   CM Interventions   Follow up appointment scheduled? No   Reason no follow up scheduled? Family to schedule   Referral made for home health RN visit? Yes   Multidisciplinary rounds/family meeting before d/c? Yes   Medicare Checklist   Is this a Medicare patient? Yes

## 2020-05-29 NOTE — PT Eval Note (Addendum)
[facility]  [address]  [place], [place] [address]  [phone]    Physical Therapy Evaluation    Patient: Emily [name] MRN: [id number]   Unit: [facility] INTERMEDIATE CARE Bed: MI626/MI626-01    Time of Treatment: Time Calculation  PT Received On: 05/29/20  Start Time: 1435  Stop Time: 1445  Time Calculation (min): 10 min    Consult received for Emily [name] for PT evaluation and treatment.  Patient's medical condition is appropriate for Physical Therapy  intervention at this time.    Interpreter utilized: no, not indicated      D/C Suggestions   Based on today's performance:  Recommendation  Discharge Recommendation: Home with supervision;Home with home health PT  DME Recommended for Discharge: No additional equipment/DME recommended at this time  PT Frequency: one time visit - therapy discontinued.      Transport Recommendations: no limitations    Discharge recommendations are based on patient's progression/regression. Please see most recent note for updated discharge recommendations.    Assessment   Emily [name] is a 74 y.o. female admitted 05/28/2020 for new onset of left face, arm, and leg numbness with difficulty with balance and ambulation. Pt also with complaints of severe headache, all over body tingling, and dizziness. At [facility], pt lives alone in apartment with 1 flight to enter with Bil Hrs and indep for mobility without AD. At time of eval, pt agreeable to participate with PT eval. Denies all current symptoms, however, she was a bit unclear/ inconsistent of hx of symptoms. Performed all transfers indep and amb 240' without AD spv. Pt demo's dec cadence, speed, and trunk sway but reports this is her baseline due to chronic dizziness. Pt noted to have to mildly weaker MMT in LLE especially with hip flexion and knee ext at 3+/5. Pt report no concerns for her mobility but denied attempt at stairs due to "I just don't want to." Pt safe to d/c home. Recommend HHPT and supervision initially to ease  transition home.       Pt's functional mobility is impacted by:  decreased activity tolerance, decreased balance, dizziness/vertigo, gait impairment and decreased strength.  There are a few comorbidities or other factors that affect plan of care and require modification of task including: vertigo/dizziness, has stairs to manage and lives alone.  Standardized tests and exams incorporated into evaluation include balance, ROM  and Strength.  Pt demonstrates a stable clinical presentation.  Pt would continue to benefit from PT to address these deficits and increase functional independence.     Complexity Level Hx and Co  morbidites Examination Clinical Decision Making Clinical Presentation   Low no impact 1-2 elements Limited options Stable       PMP - Progressive Mobility Protocol   PMP Activity: Step 7 - Walks out of Room  Distance Walked (ft) (Step 6,7): 240 Feet       Rehabilitation Potential: Good at next level of care (HHPT)       Interdisciplinary Communication: RN, OT      Plan       Plan  Risks/Benefits/POC Discussed with Pt/Family: With patient  Treatment/Interventions: No skilled interventions needed at this time  PT Frequency: one time visit - therapy discontinued         Medical Diagnosis: Dizziness [R42]    History of Present Illness: Emily [name] is a 74 y.o. female admitted on  05/28/2020 "comes into the hospital for new onset of left face arm and leg  numbness and gait ataxia difficulty walking and balance problems that started this morning she also reports moderately severe headache, affecting in the entire head when asked her that she has had similar symptoms in the past she said no, but patient has been hospitalized multiple times with similar presentations, with negative brain MRI"        Patient Active Problem List   Diagnosis   . Malignant neoplasm of overlapping sites of right female breast   . Hypertension   . Mixed hyperlipidemia   . Hypothyroid   . Right sided numbness   . Chest discomfort   .  SOB (shortness of breath)   . Left sided numbness   . Left-sided weakness   . COVID-19 ruled out by laboratory testing   . Acute colitis   . Dizziness       Past Medical History:   Diagnosis Date   . Breast cancer 2014    right breast mastectomy   . GERD (gastroesophageal reflux disease)    . Hyperlipidemia    . Hypertension    . Hypothyroidism    . Malignant neoplasm of overlapping sites of right female breast 10/19/2015   . Vertigo        Past Surgical History:   Procedure Laterality Date   . HYSTERECTOMY     . MASTECTOMY Right 2015       X-Rays/Tests/Labs:  Lab Results   Component Value Date/Time    HGB 10.6 (L) 05/28/2020 11:24 AM    HCT 34.8 05/28/2020 11:24 AM    K 4.5 05/29/2020 05:16 AM    NA 142 05/29/2020 05:16 AM    INR 1.0 05/28/2020 11:37 AM    TROPI <0.01 05/28/2020 11:24 AM    TROPI <0.01 05/12/2020 12:47 PM    TROPI <0.01 05/10/2020 12:45 PM    TROPI <0.01 03/08/2020 04:23 PM    TROPI 0.01 07/15/2009 09:40 AM     CT Head WO Contrast (Order: [id number]) - 05/28/2020  IMPRESSION:      No hemorrhage or acute abnormality. Stable chronic changes.    Social History: (Per family/care provider, if patient is not able to give accurate report)  Lives alone in apartment with one flight to enter, bil HRs   Equipment at home:  No prior DME  Prior Level of Function:  Pt indep community level ambulator without AD. Still drives. Does not work due to "dizziness"  Pt denies hx of falls     Subjective   Patient is agreeable to participation in the therapy session.  Patient's Goal:  Go home  Pain: pt denies pain, reports no current dizziness      Objective     Precautions/ Contraindications:   Precautions  Weight Bearing Status: no restrictions  Other Precautions: fall risk    Patient is in bed with  Telemetry and Intravenous Access in place.       Observation of patient/vitals:        Vitals:    05/29/20 0756 05/29/20 0758 05/29/20 0759 05/29/20 1200   BP: 133/72 125/72 121/76 114/68   Pulse: (!) 56 64 89 (!) 54   Resp:  18   16   Temp: 98.1 F (36.7 C)   98.2 F (36.8 C)   TempSrc: Oral   Oral   SpO2: 100%   100%   Weight:       Height:           Orientation/Cognition:  Alert and Oriented x 4  Cognition:  Follows 1 step commands with increased repetition. Pt a bit inconsistent with describing hx of symptoms      Musculoskeletal Examination:      ROM Strength   RLE WFL 4-/5   LLE WFL 3+/5 knee ext, hip flexion  4-/5 all other movements     Sensation: Intact to light touch, denies N/T to BLEs  Coordination: Grossly intact    Functional Mobility:  Rolling: indep    Supine to sit: indep  Scooting: indep  Sit to Supine: indep  Sit to stand: indep  Stand to sit: indep  Ambulation:     Weightbearing: no restrictions   Assistance level: spv   Distance: 240'   Assistive Device: no AD   Gait Deviations: Pt demo's notable slow cadence and speed with trunk sway. Reports this is normal for her though   Stairs: Pt declined attempt of stairs stating "I just don't want to"    Balance:  Static Sit: good  Dynamic Sit: good  Static Stand: good  Dynamic Stand: good-    Endurance: good    Participation:  Fair    Education:  Educated the patient to role of physical therapy, plan of care, goals  of therapy, rationale for progressing mobility and HEP, safety with mobility and ADLs, discharge instructions and home safety.    RN notified of session outcome and that patient was left sitting EOB with all needs met and equipment intact.   Safety measures include: bed alarm activated, oriented to call bell and placed within reach and personal items within reach.   Mobility and ADL status posted at bedside and within E.M.R.                       Modified Rankin Scale:     I am certified in mRS: Yes  mRS Assessment Source: Patient  mRS Value: 1 - No significant disability    Therapist PPE during session procedural mask, goggles  and gloves     Signature:  Laurence Spates, PT  05/29/2020  3:19 PM  Phone: [phone]     (For scheduling questions, please contact rehab tech  [phone])      Attention MD:   Thank you for allowing [place] to participate in the care of [facility]. Regulations from the Center for Medicare and Medicaid Services (CMS) require your review and approval of this plan of care.     Please cosign this note indicating you are in agreement with theTherapy Plan of Care so we may initiate the therapy treatment plan.

## 2020-05-30 LAB — ECG 12-LEAD
Atrial Rate: 71 {beats}/min
P Axis: 48 degrees
P-R Interval: 180 ms
Q-T Interval: 390 ms
QRS Duration: 88 ms
QTC Calculation (Bezet): 423 ms
R Axis: 25 degrees
T Axis: 14 degrees
Ventricular Rate: 71 {beats}/min

## 2020-05-30 LAB — LIPID PANEL
Cholesterol / HDL Ratio: 3.5
Cholesterol: 211 mg/dL — ABNORMAL HIGH (ref 0–199)
HDL: 61 mg/dL (ref 40–9999)
LDL Calculated: 132 mg/dL — ABNORMAL HIGH (ref 0–99)
Triglycerides: 91 mg/dL (ref 34–149)
VLDL Calculated: 18 mg/dL (ref 10–40)

## 2020-05-31 NOTE — Progress Notes (Signed)
Reported by Jeanella Flattery, [facility], that she left a VM and asked me to call the patient to setup [facility].     08/28   1130 TC to patient. Phone was answered and hung up. Will call patient again tomorrow morning.    08/29  0900 TC to patient. Patient answered phone. Writer told her about services that were recommended. Patient said that she will call writer back. Will continue to follow and give [facility] update.    Adine Madura RN, BSN  Post Acute Care Coordinator   [phone]  [email or web address]

## 2020-06-25 ENCOUNTER — Encounter (INDEPENDENT_AMBULATORY_CARE_PROVIDER_SITE_OTHER): Payer: Self-pay | Admitting: Internal Medicine

## 2020-06-25 ENCOUNTER — Ambulatory Visit (INDEPENDENT_AMBULATORY_CARE_PROVIDER_SITE_OTHER): Payer: Medicare Other | Admitting: Internal Medicine

## 2020-06-25 VITALS — BP 158/70 | HR 67 | Temp 97.4°F | Ht 63.0 in | Wt 155.4 lb

## 2020-06-25 DIAGNOSIS — D649 Anemia, unspecified: Secondary | ICD-10-CM

## 2020-06-25 DIAGNOSIS — K529 Noninfective gastroenteritis and colitis, unspecified: Secondary | ICD-10-CM

## 2020-06-25 DIAGNOSIS — R1031 Right lower quadrant pain: Secondary | ICD-10-CM

## 2020-06-25 DIAGNOSIS — R933 Abnormal findings on diagnostic imaging of other parts of digestive tract: Secondary | ICD-10-CM

## 2020-06-25 MED ORDER — PEG 3350-KCL-NA BICARB-NACL 420 G PO SOLR
4000.0000 mL | Freq: Once | ORAL | 0 refills | Status: AC
Start: 2020-06-25 — End: 2020-06-25

## 2020-06-25 NOTE — Patient Instructions (Signed)
Patient Instructions:    -CBC  -Ferritin  -Iron Panel (Iron, transferrin, TIBC and %  transferrin saturation)  - C-reactive protein  - Celiac serology  - Fecal calprotectin for IBD for diarrhea  - Colonoscopy for lower abdominal pain. Monitored anesthesia care (MAC) for endoscopy. Counseled risks of complications related to endoscopy.   - Lab tests confirmed iron deficiency anemia will consider EGD with colonoscopy for bidirectional endoscopic evaluation.  - Fiber supplementation: Metamucil over-the-counter, 1 packet once to twice daily.    Diagnostic tests and referrals placed today:    - Colonoscopy (See instructions below)    Orders Placed This Encounter   Procedures   . CBC and differential     Standing Status:   Future     Standing Expiration Date:   06/26/2021     Order Specific Question:   Release to patient     Answer:   Immediate   . Ferritin     Standing Status:   Future     Standing Expiration Date:   06/26/2021     Order Specific Question:   Release to patient     Answer:   Immediate   . IRON PROFILE     Standing Status:   Future     Standing Expiration Date:   06/25/2021     Order Specific Question:   Release to patient     Answer:   Immediate   . C Reactive Protein     Standing Status:   Future     Standing Expiration Date:   06/25/2021     Order Specific Question:   Release to patient     Answer:   Immediate   . Celiac Disease Comprehensive Panel     Standing Status:   Future     Standing Expiration Date:   06/25/2021     Order Specific Question:   Release to patient     Answer:   Immediate   . Stool Calprotectin Immunoassay     Standing Status:   Future     Standing Expiration Date:   12/23/2020     Order Specific Question:   Specimen type/source/volume     Answer:   Stool/10 mL     Order Specific Question:   Release to patient     Answer:   Immediate       Start taking the following new medications prescribed today:  Orders Placed This Encounter   Medications   . polyethylene glycol-electrolytes (Nulytely  with Flavor Packs) 420 g solution     Sig: Take 4,000 mLs by mouth once for 1 dose Follow the bowel preparation instructions sent from your GI doctor's office.     Dispense:  4000 mL     Refill:  0        Stop taking the following medications:  There are no discontinued medications.    ---------------    Colonoscopy Bowel Prep Instructions    Einar Grad, MD  [address] [facility] [address]  [place] [address]    Important: If your preferred pharmacy does not have the bowel preparation solution GoLYTELY and its alternatives (Colyte, NuLYTELY, GaviLYTE, and Trilyte), it is because currently there is a Sport and exercise psychologist of the bowel preparation. Please call other pharmacies in your area. Once you confirm the availability of the bowel preparation solution with the new pharmacy, please call our office [phone]) to have a new prescription sent to the pharmacy.    Date/Time of Procedure: 07/21/20  at 10:45 AM     Patient Name: Emily [name]    1. Please mix your prep solution (PEG/Electrolyte-Generic, NuLYTELY, GoLYTELY, TriLyte and CoLyte) with one gallon of water. This should be done 24 hours before the scheduled procedure and the entire jug should be placed in the refrigerator so that is adequately chilled. Nothing must be added to the solution, other than flavor packet, but you may keep the jug in a pan of ice water while you are drinking your prep solution.    2. Diet: The day before your procedure, you may only have clear liquids for breakfast, lunch, and dinner ( Broth, Jello, clear sodas, clear apple juice, white grape juice, white cranberry juice, weak tea NO milk or lemon). You may continue to have clear liquids until 3 hours prior to your procedure. NO RED JELLO    3. The prep solution is a split-dose (2-day) regimen. The entire one gallon of prep solution is required for a complete prep.    3. First Half Gallon Dose: Start to drink the first half gallon of prep solution at 6 pm the evening before  your procedure: Drink one 8oz glass every 10-15 minutes until the half gallon has been consumed. It is important that this rate be maintained if at all possible. You should expect to begin moving your bowels within one hour after you begin drinking the  prep solution and those bowel movements will quickly become liquid and eventually clear.     4. Second Half Gallon Dose: On the day of your colonoscopy, you may have to wake up early. 6 HOURS before your colonoscopy, start to drink the second half gallon of prep solution, the same way you drank the first half gallon: drink one 8oz glass every 10-15 minutes until the half gallon has been consumed. If you drink according to schedule, you will finish drinking 4 hours before your colonoscopy.     Note: You must stop everything by mouth, including all liquids, smoking and chewing gum 3 hour before your colonoscopy.    5. If you are taking any type of iron tablets stop taking them until after your procedure.       TAKE THE FOLLOWING MEDICATIONS WITH A SIP OF WATER ON THE MORNING OF THE TEST:      . You may take all of your morning medicines (except for oral diabetes medicine - pills) as usual with a sip of water up to 4 hours before your procedure.    . If you take oral diabetes medicine (pills) for your diabetes: do not take the medicine the morning of your procedure.       *Important note: This examination carries with it the risk of perforation, a serious complication that could require surgery.       PLEASE NOTE:  YOU MUST HAVE SOMEONE DRIVE YOU HOME FROM YOUR PROCEDURE, OTHER THAN A TAXI OR YOUR PROCEDURE WILL BE CANCELLED!      []  MT. [facility]     If your procedure is to be done at [facility], please report to the registration desk at the yellow entrance 1 HOUR prior to your scheduled procedure.    [address]  [place], [place] [address]  [phone]          -------------------        Colonoscopy     A camera attached to a flexible tube with a  viewing lens is used to take video pictures.   Colonoscopy  is a test to view the inside of your lower digestive tract (colon and rectum). Sometimes it can show the last part of the small intestine (ileum). During the test, small pieces of tissue may be removed for testing. This is called a biopsy. Small growths, such as polyps, may also be removed.   Why is colonoscopy done?  The test is done to help look for colon cancer. And it can help find the source of abdominal pain, bleeding, and changes in bowel habits. It may be needed once a year to every 10 years, depending on factors such as your:   Age   Health history   Family health history   Symptoms   Results from any prior colonoscopy  Risks and possible complications  These include:   Bleeding                A puncture or tear in the colon    Risks of anesthesia   A cancer lesion not being seen or fully removed  Getting ready   To prepare for the test:   Talk with your healthcare provider about the risks of the test (see below). Also ask your healthcare provider about alternatives to the test.   Tell your healthcare provider about any medicines and supplements you take. Also tell him or her about any health conditions you may have.   Make sure your rectum and colon are empty for the test. Follow the diet and bowel prep instructions exactly. If you don't, the test may need to be rescheduled.   Plan for a friend or family member to drive you home after the test.     Colonoscopy provides an inside view of the entire colon.     You may discuss the results with your doctor right away or at a future visit.  During the test   The test is usually done in the hospital on an outpatient basis or at an outpatient clinic. This means you go home the same day. The procedure takes about 30 minutes. During that time:   You are given relaxing (sedating) medicine through an IV line. You may be drowsy, or fully asleep.   The healthcare provider will first give you a  physical exam to check for anal and rectal problems.   Then the anus is lubricated and the scope inserted.   If you are awake, you may have a feeling similar to needing to have a bowel movement. You may also feel pressure as air is pumped into the colon. It's OK to pass gas during the procedure.   Biopsy, polyp removal, or other treatments may be done during the test.  After the test   You may have gas right after the test. It can help to try to pass it to help prevent later bloating. Your healthcare provider may discuss the results with you right away. Or you may need to schedule a follow-up visit to talk about the results. After the test, you can go back to your normal eating and other activities. You may be tired from the sedation and need to rest for a few hours. Discuss your medicines with your provider to understand if they can be restarted right away.  [employer] last reviewed this educational content on 03/03/2018   2000-2020 [employer] [place]. [address], [place], [place] [address]. All rights reserved. This information is not intended as a substitute for professional medical care. Always follow your healthcare professional's instructions.

## 2020-06-25 NOTE — Progress Notes (Signed)
[address], [address]  [place], [place] [address]  Phone:[phone]  Fax:[phone]      NEW PATIENT VISIT    Date of Visit: 06/25/2020 10:37 AM  Patient ID: Emily [name] is a 74 y.o. female.  Referring Physician: @REFPROVFL @       Reason for Consultation:   Colitis    History:   Emily [name] is a 74 y.o. female with a history of breast cancer, status post right breast mastectomy, hypertension, hyperlipidemia, hypothyroidism, chronic vertigo, who presents for follow-up after recent hospital discharge for colitis.    She was recently hospitalized at [facility] in June 2021 with acute onset abdominal pain.  CT abdomen and pelvis showed wall thickening in the right colon.  Lab tests were notable for ESR of 21 and CRP of 24.4.  She was initiated on empiric antibiotic therapy for acute infectious colitis.  Her abdominal pain improved, however she subsequently developed loose stools on day 3.  Stool culture, ova and parasites, and C. difficile test were negative.  She received Imodium as needed for diarrhea.  And was advised to follow-up with GI to rule out IBD. Last colonoscopy more than 10 years ago was reportedly normal.      She is readmitted in August 2021 with dizziness, gait ataxia, tingling and numbness associate with headaches felt to be migraine phenomena for which she received magnesium oxide for headache prevention.    Recent lab testing August 2021 showed anemia with H&H 10.6/34.8%.      Problem List:     Patient Active Problem List   Diagnosis   . Malignant neoplasm of overlapping sites of right female breast   . Hypertension   . Mixed hyperlipidemia   . Hypothyroid   . Right sided numbness   . Chest discomfort   . SOB (shortness of breath)   . Left sided numbness   . Left-sided weakness   . COVID-19 ruled out by laboratory testing   . Acute colitis   . Dizziness       Past Medical History:     Past Medical History:   Diagnosis Date   . Breast cancer 2014    right breast mastectomy    . GERD (gastroesophageal reflux disease)    . Hyperlipidemia    . Hypertension    . Hypothyroidism    . Malignant neoplasm of overlapping sites of right female breast 10/19/2015   . Vertigo        Past Surgical History:     Past Surgical History:   Procedure Laterality Date   . HYSTERECTOMY     . MASTECTOMY Right 2015       Current Medications:   has a current medication list which includes the following prescription(s): amlodipine, anastrozole, aspirin, ezetimibe, lactobacillus/streptococcus, levothyroxine, lisinopril, loperamide, magnesium oxide, meclizine, multivitamin with minerals, pantoprazole, rosuvastatin, vitamin b-1, desonide, and polyethylene glycol-electrolytes.     Outpatient Medications Marked as Taking for the 06/25/20 encounter (Office Visit) with Einar Grad, MD   Medication Sig Dispense Refill   . amLODIPine (NORVASC) 10 MG tablet Take 1 tablet (10 mg total) by mouth daily 30 tablet 0   . anastrozole (ARIMIDEX) 1 MG tablet Take 1 tablet (1 mg total) by mouth daily (Patient taking differently: Take 1 mg by mouth every morning    ) 90 tablet 3   . aspirin 81 MG chewable tablet Chew 1 tablet (81 mg total) by mouth daily 90 tablet 0   . ezetimibe (  ZETIA) 10 MG tablet Take 1 tablet (10 mg total) by mouth daily 90 tablet 3   . lactobacillus/streptococcus (RISAQUAD) Cap Take 1 capsule by mouth daily 10 capsule 0   . levothyroxine (SYNTHROID) 25 MCG tablet Take 1 tablet (25 mcg total) by mouth every morning 30 tablet 0   . lisinopril (ZESTRIL) 40 MG tablet Take 1 tablet (40 mg total) by mouth daily 90 tablet 3   . loperamide (IMODIUM) 2 MG capsule Take 1 capsule (2 mg total) by mouth 4 (four) times daily as needed for Diarrhea 10 capsule 0   . magnesium oxide (MAG-OX) 400 MG tablet Take 1 tablet (400 mg total) by mouth daily 30 tablet 0   . meclizine (ANTIVERT) 25 MG tablet Take 1 tablet (25 mg total) by mouth 3 (three) times daily as needed for Dizziness 10 tablet 0   . Multiple Vitamins-Minerals  (MULTIVITAMIN WITH MINERALS) tablet Take 1 tablet by mouth every morning         . pantoprazole (PROTONIX) 40 MG tablet Take 1 tablet (40 mg total) by mouth every morning before breakfast 30 tablet 0   . rosuvastatin (CRESTOR) 40 MG tablet Take 0.5 tablets (20 mg total) by mouth daily 30 tablet 0   . vitamin B-1 (THIAMINE) 100 MG tablet Take 100 mg by mouth every morning.         Allergies:     Allergies   Allergen Reactions   . Morphine Itching   . Morphine And Related Itching and Nausea And Vomiting     dizzy       Family History:     Family History   Problem Relation Age of Onset   . Breast cancer Neg Hx        Social History:     Social History     Tobacco Use   . Smoking status: Never Smoker   . Smokeless tobacco: Never Used   Vaping Use   . Vaping Use: Never used   Substance Use Topics   . Alcohol use: Yes     Alcohol/week: 1.0 standard drinks     Types: 1 Cans of beer per week     Comment: socially   . Drug use: No       Review of Systems:   Constitutional: Denies fever, chills or weight loss.  Eyes: Denies Blurred vision, eye discharge or eye pain.   ENMT: Denies changes in hearing, nasal discharge, oral lesions or sore throat.  Respiratory: Denies wheezing or cough.  Cardiovascular: Denies chest pain or palpitations.  Gastrointestinal: See HPI.  Genitourinary: Denies gross hematuria or dark urine.  Musculoskeletal: Denies back pain or joint pain.  Neurologic: Denies slurred speech, hemiplegia or focal deficit.  Psychiatric: Denies depression or anxiety.   Hematologic: Denies easy bruising.  Integumentary:  Denies skin rashes or Jaundice.    Vital Signs:   BP 158/70 (BP Site: Left arm, Patient Position: Sitting, Cuff Size: Medium)   Pulse 67   Temp 97.4 F (36.3 C) (Temporal)   Ht 1.6 m (5\' 3" )   Wt 70.5 kg (155 lb 6.4 oz)   BMI 27.53 kg/m         Weight Monitoring 05/28/2020 05/28/2020 06/25/2020   Height - 160 cm 160 cm   Height Method - - -   Weight 71.9 kg 71.9 kg 70.489 kg   Weight Method Bed Scale  - -   BMI (calculated) - 28.1 kg/m2 27.6 kg/m2  Physical Exam:   General appearance - Well developed and well nourished, no acute distress.  Eyes - Sclera anicteric, no ptosis.  ENMT - Mucous membranes moist, nose and ears appear normal.  Oropharynx clear.  Respiratory - Non labored respirations, no audible wheezing.  Cardiovascular - Regular rate and rhythm, no JVD, no LE edema.  Gastrointestinal - Soft, non-tender, non-distended, no masses, normal bowel sounds.   Musculoskeletal - Normal range of motion of arms and legs.  Neurologic - Alert and oriented to person, place and time.  No gross movement disorders noted.  Psychiatric: Appropriate affect.  Skin: Normal color and turgor, no rashes, no suspicious skin lesions noted.    Labs Reviewed:     Recent Labs     05/28/20  1124 05/12/20  1247 05/10/20  1245   WBC 5.86 5.77 6.90   Hgb 10.6* 12.6 11.5   Hematocrit 34.8 41.8 38.0   Platelets 294 333 306       Recent Labs     05/28/20  1137 01/27/20  2351 04/15/19  1308   PT 11.6 12.0 12.5*   PT INR 1.0 1.0 0.9       Recent Labs     05/29/20  0516 05/28/20  1124 05/12/20  1350 05/10/20  1245 05/10/20  1245 04/15/19  1308 01/11/19  1710 11/13/18  1206 05/14/18  1024 04/04/18  0659 04/03/18  1352 04/03/18  1352 02/07/18  1837 09/13/17  1252 07/10/17  0628 05/27/17  0602   Sodium 142 141 142   < > 143   < > 137   < > 142 143   < > 140   < > 144   < >  --    Potassium 4.5 4.0 4.6   < > 4.1   < > 3.5   < > 3.8 4.6   < > 4.6   < > 4.2   < >  --    Chloride 105 104 104   < > 105   < > 89*   < > 104 107   < > 106   < > 103   < >  --    CO2 25 26 24    < > 24   < > 29   < > 32* 27   < > 25   < > 26   < >  --    BUN 7 9 13    < > 14   < > 13   < > 13 17   < > 17   < > 13   < >  --    Creatinine 0.8 0.8 0.9   < > 0.8   < > 1.0   < > 0.9 0.9   < > 0.9   < > 0.9   < >  --    Calcium 9.3 9.2 9.5   < > 9.7   < > 10.1   < > 8.5 9.3   < > 9.3   < > 9.7   < >  --    Albumin  --  3.7 4.2  --  4.3   < > 4.5   < > 3.2*  --    < > 3.7    < > 4.1   < >  --    Protein, Total  --  6.1 7.4  --  7.3   < > 7.8   < > 5.3*  --    < >  6.7   < > 7.9   < >  --    Bilirubin, Total  --  0.3 0.5  --  0.2   < > 0.3   < > 0.3  --    < > 0.4   < > 0.5   < >  --    Alkaline Phosphatase  --  75 74  --  83   < > 122*   < > 83  --    < > 107*   < > 117*   < >  --    ALT  --  12 13  --  11   < > 19   < > 15  --    < > 19   < > 20   < >  --    AST (SGOT)  --  17 25  --  22   < > 35*   < > 16  --    < > 24   < > 23   < >  --    Glucose 92 96 105*   < > 98   < > 106*   < > 102* 94   < > 97   < > 89   < >  --    Lipase  --   --   --   --   --   --  40  --   --   --   --  19  --  25  23  --   --    TSH  --   --   --   --   --   --   --   --  0.50 1.29  --   --   --   --   --  1.00    < > = values in this interval not displayed.        Rads:   CT Head WO Contrast    Result Date: 05/28/2020   No hemorrhage or acute abnormality. Stable chronic changes. Heron Nay, MD  05/28/2020 12:46 PM      GI Procedures:       Assessment and Plan:     Data Reviewed: referring physician note(s) and lab reviewed as above    1. Right lower quadrant abdominal pain    2. Abnormal CT scan, gastrointestinal tract    3. Right sided colitis  - C Reactive Protein; Future  - Stool Calprotectin Immunoassay; Future    4. Anemia, unspecified type  - CBC and differential; Future  - Ferritin; Future  - IRON PROFILE; Future  - Celiac Disease Comprehensive Panel; Future     Orders Placed This Encounter   Medications   . polyethylene glycol-electrolytes (Nulytely with Flavor Packs) 420 g solution     Sig: Take 4,000 mLs by mouth once for 1 dose Follow the bowel preparation instructions sent from your GI doctor's office.     Dispense:  4000 mL     Refill:  0      There are no discontinued medications.     # RLQ abdominal pain right side colitis on CT  Differential diagnoses include inflammatory bowel disease (ulcerative colitis and Crohn's disease), infectious colitis, and rule out neoplasms.  Postinfectious  IBS is possible.    #Anemia.  New onset.  Suspect iron deficiency anemia secondary to GI blood loss.     Plan:  -CBC  -Ferritin  -Iron Panel (Iron,  transferrin, TIBC and %  transferrin saturation)  - C-reactive protein  - Celiac serology  - Fecal calprotectin for IBD for diarrhea  - Colonoscopy for lower abdominal pain. Monitored anesthesia care (MAC) for endoscopy. Counseled risks of complications related to endoscopy.   - Lab tests confirmed iron deficiency anemia will consider EGD with colonoscopy for bidirectional endoscopic evaluation.  - Fiber supplementation: Metamucil over-the-counter, 1 packet once to twice daily.    Follow-up date will be determined when the diagnostic test results are back.  Return if symptoms worsen or fail to improve.    It is a pleasure to participate in the care of Emily [name]. If you have any questions or concerns, please feel free to contact us at [phone].    Einar Grad, MD

## 2020-06-25 NOTE — Addendum Note (Signed)
Addended by: Caiya Bettes on: 06/25/2020 10:42 AM     Modules accepted: Level of Service

## 2020-06-29 ENCOUNTER — Ambulatory Visit
Admission: RE | Admit: 2020-06-29 | Discharge: 2020-06-29 | Disposition: A | Discharge status: Home / Self Care | Payer: Medicare Other | Source: Ambulatory Visit | Attending: Internal Medicine | Admitting: Internal Medicine

## 2020-06-29 DIAGNOSIS — D649 Anemia, unspecified: Secondary | ICD-10-CM | POA: Insufficient documentation

## 2020-06-29 DIAGNOSIS — K529 Noninfective gastroenteritis and colitis, unspecified: Secondary | ICD-10-CM | POA: Insufficient documentation

## 2020-06-29 LAB — HEMOLYSIS INDEX: Hemolysis Index: 9 (ref 0–18)

## 2020-06-29 LAB — CBC AND DIFFERENTIAL
Absolute NRBC: 0 10*3/uL (ref 0.00–0.00)
Basophils Absolute Automated: 0.04 10*3/uL (ref 0.00–0.08)
Basophils Automated: 0.6 %
Eosinophils Absolute Automated: 0.11 10*3/uL (ref 0.00–0.44)
Eosinophils Automated: 1.7 %
Hematocrit: 36.6 % (ref 34.7–43.7)
Hgb: 11.1 g/dL — ABNORMAL LOW (ref 11.4–14.8)
Immature Granulocytes Absolute: 0.01 10*3/uL (ref 0.00–0.07)
Immature Granulocytes: 0.2 %
Lymphocytes Absolute Automated: 1.82 10*3/uL (ref 0.42–3.22)
Lymphocytes Automated: 27.3 %
MCH: 27 pg (ref 25.1–33.5)
MCHC: 30.3 g/dL — ABNORMAL LOW (ref 31.5–35.8)
MCV: 89.1 fL (ref 78.0–96.0)
MPV: 12.3 fL (ref 8.9–12.5)
Monocytes Absolute Automated: 0.56 10*3/uL (ref 0.21–0.85)
Monocytes: 8.4 %
Neutrophils Absolute: 4.12 10*3/uL (ref 1.10–6.33)
Neutrophils: 61.8 %
Nucleated RBC: 0 /100 WBC (ref 0.0–0.0)
Platelets: 330 10*3/uL (ref 142–346)
RBC: 4.11 10*6/uL (ref 3.90–5.10)
RDW: 13 % (ref 11–15)
WBC: 6.66 10*3/uL (ref 3.10–9.50)

## 2020-06-29 LAB — FERRITIN: Ferritin: 24.6 ng/mL (ref 4.60–204.00)

## 2020-06-29 LAB — IRON PROFILE
Iron Saturation: 26 % (ref 15–50)
Iron: 71 ug/dL (ref 40–145)
TIBC: 268 ug/dL (ref 265–497)
UIBC: 197 ug/dL (ref 126–382)

## 2020-06-29 LAB — C-REACTIVE PROTEIN: C-Reactive Protein: 0.4 mg/dL (ref 0.0–0.8)

## 2020-06-30 LAB — CELIAC DISEASE COMP PANEL: IGA for Celiac: 134 mg/dL (ref 61–356)

## 2020-06-30 LAB — TISSUE TRANSGLUTAMINASE, IGA: Tissue Transglutaminase Antibody, IgA: <1.2 U/mL

## 2020-07-01 ENCOUNTER — Telehealth (INDEPENDENT_AMBULATORY_CARE_PROVIDER_SITE_OTHER): Payer: Self-pay | Admitting: Cardiology

## 2020-07-01 NOTE — Progress Notes (Signed)
GI nurse/MA team, please let the patient know that lab tests showed iron deficiency anemia.  As we previously discussed, we will add EGD to the colonoscopy on 07/21/2020 for directional endoscopic evaluation.  Okay to keep the 45 minutes slot for both EGD and colonoscopy.  Please update posting slip.    ----------------------------    Dear Corrie Dandy,     I have reviewed your recent tests. The results are within normal/acceptable limits. Please notify the office if you have any questions regarding the test results.    Einar Grad, MD  [facility]

## 2020-07-01 NOTE — Telephone Encounter (Signed)
I received a call from the patient who advised she wanted an appointment with Dr. Franchot Erichsen. The patient advised she was having active pain under her left breast along with rapid heart beat. I transferred the patient to [facility] [place] [facility] for triage.    LaNicha R. Taylor-Stubbs  [facility]

## 2020-07-02 ENCOUNTER — Ambulatory Visit
Admission: RE | Admit: 2020-07-02 | Discharge: 2020-07-02 | Disposition: A | Discharge status: Home / Self Care | Payer: Medicare Other | Source: Ambulatory Visit | Attending: Internal Medicine | Admitting: Internal Medicine

## 2020-07-02 DIAGNOSIS — K529 Noninfective gastroenteritis and colitis, unspecified: Secondary | ICD-10-CM | POA: Insufficient documentation

## 2020-07-03 ENCOUNTER — Telehealth (INDEPENDENT_AMBULATORY_CARE_PROVIDER_SITE_OTHER): Payer: Self-pay

## 2020-07-03 NOTE — Telephone Encounter (Signed)
LVMTCB

## 2020-07-04 LAB — STOOL CALPROTECTIN IMMUNOASSAY: Stool Calprotectin: <15.6

## 2020-07-08 ENCOUNTER — Telehealth (INDEPENDENT_AMBULATORY_CARE_PROVIDER_SITE_OTHER): Payer: Self-pay

## 2020-07-08 NOTE — Telephone Encounter (Signed)
Pt called to confirm her appointment on 07/21/2020 time 10.45 am ,arriving 9.45 am at [facility].Pt has the  prep solution . Pt verbalized understanding of prep directions.

## 2020-07-10 ENCOUNTER — Telehealth (INDEPENDENT_AMBULATORY_CARE_PROVIDER_SITE_OTHER): Payer: Self-pay

## 2020-07-10 ENCOUNTER — Other Ambulatory Visit (INDEPENDENT_AMBULATORY_CARE_PROVIDER_SITE_OTHER): Payer: Self-pay

## 2020-07-10 DIAGNOSIS — R933 Abnormal findings on diagnostic imaging of other parts of digestive tract: Secondary | ICD-10-CM

## 2020-07-10 DIAGNOSIS — R1031 Right lower quadrant pain: Secondary | ICD-10-CM | POA: Insufficient documentation

## 2020-07-10 DIAGNOSIS — K529 Noninfective gastroenteritis and colitis, unspecified: Secondary | ICD-10-CM

## 2020-07-10 NOTE — Telephone Encounter (Signed)
Called patient to say that her labs showed iron deficiency anemia and that an EGD would be added to the colonoscopy on 10/19, verbalized understanding,  Patient had no additional questions regarding this matter.  Advised to call office back if any questions arise.

## 2020-07-14 ENCOUNTER — Encounter (INDEPENDENT_AMBULATORY_CARE_PROVIDER_SITE_OTHER): Payer: Self-pay | Admitting: Cardiology

## 2020-07-14 ENCOUNTER — Ambulatory Visit (INDEPENDENT_AMBULATORY_CARE_PROVIDER_SITE_OTHER): Payer: Medicare Other | Admitting: Cardiology

## 2020-07-14 VITALS — BP 138/64 | HR 50 | Resp 18 | Ht 61.02 in | Wt 152.0 lb

## 2020-07-14 DIAGNOSIS — I10 Essential (primary) hypertension: Secondary | ICD-10-CM

## 2020-07-14 DIAGNOSIS — E782 Mixed hyperlipidemia: Secondary | ICD-10-CM

## 2020-07-14 MED ORDER — ROSUVASTATIN CALCIUM 40 MG PO TABS
20.0000 mg | ORAL_TABLET | Freq: Every day | ORAL | 3 refills | Status: DC
Start: 2020-07-14 — End: 2020-09-28

## 2020-07-14 NOTE — Progress Notes (Signed)
[facility] PROGRESS NOTE    I had the pleasure of seeing Emily [name] today for cardiovascular follow up. He is a pleasant 74 y.o. female with a history ofatypical chest discomfort July 2020 that led to a normal Lexiscan nuclear scan and echocardiogram both July 2020 with ejection fraction of 60% and no significant valvular abnormality.         MEDICATIONS:  Current Outpatient Medications   Medication Sig Dispense Refill   . anastrozole (ARIMIDEX) 1 MG tablet Take 1 tablet (1 mg total) by mouth daily (Patient taking differently: Take 1 mg by mouth every morning    ) 90 tablet 3   . aspirin 81 MG chewable tablet Chew 1 tablet (81 mg total) by mouth daily 90 tablet 0   . desonide (DESOWEN) 0.05 % ointment Apply topically 2 (two) times daily 15 g 0   . lactobacillus/streptococcus (RISAQUAD) Cap Take 1 capsule by mouth daily 10 capsule 0   . levothyroxine (SYNTHROID) 25 MCG tablet Take 1 tablet (25 mcg total) by mouth every morning 30 tablet 0   . lisinopril (ZESTRIL) 40 MG tablet Take 1 tablet (40 mg total) by mouth daily 90 tablet 3   . loperamide (IMODIUM) 2 MG capsule Take 1 capsule (2 mg total) by mouth 4 (four) times daily as needed for Diarrhea 10 capsule 0   . magnesium oxide (MAG-OX) 400 MG tablet Take 1 tablet (400 mg total) by mouth daily 30 tablet 0   . meclizine (ANTIVERT) 25 MG tablet Take 1 tablet (25 mg total) by mouth 3 (three) times daily as needed for Dizziness 10 tablet 0   . Multiple Vitamins-Minerals (MULTIVITAMIN WITH MINERALS) tablet Take 1 tablet by mouth every morning         . pantoprazole (PROTONIX) 40 MG tablet Take 1 tablet (40 mg total) by mouth every morning before breakfast 30 tablet 0   . rosuvastatin (CRESTOR) 40 MG tablet Take 0.5 tablets (20 mg total) by mouth daily 90 tablet 3   . vitamin B-1 (THIAMINE) 100 MG tablet Take 100 mg by mouth every morning.     Marland Kitchen amLODIPine (NORVASC) 10 MG tablet Take 1 tablet (10 mg total) by mouth daily 30 tablet 0   . ezetimibe (ZETIA) 10 MG  tablet Take 1 tablet (10 mg total) by mouth daily 90 tablet 3         REVIEW OF SYSTEMS: All other systems reviewed and negative except as above.    PHYSICAL EXAMINATION  General Appearance: well-appearing and in no acute distress.   Vital Signs: BP 138/64 (BP Site: Left arm, Patient Position: Sitting, Cuff Size: Medium)   Pulse (!) 50   Resp 18   Ht 1.55 m (5' 1.02")   Wt 68.9 kg (152 lb)   SpO2 98%   BMI 28.70 kg/m    HEENT: Sclera anicteric, conjunctiva without pallor, moist mucous membranes.  Neck: Supple without jugular venous distention.     Chest: Clear to auscultation bilaterally with good air movement and respiratory effort and no wheezes, rales, or rhonchi  Cardiovascular: Normal S1 and  S2 without murmurs, gallops or rub. PMI of normal size and nondisplaced.   Extremities: Warm without edema.    ECG: Sinus bradycardia rate 50 bpm normal EKG  Past Medical History:   Diagnosis Date   . Breast cancer 2014    right breast mastectomy   . GERD (gastroesophageal reflux disease)    . Hyperlipidemia    . Hypertension    .  Hypothyroidism    . Malignant neoplasm of overlapping sites of right female breast 10/19/2015   . Vertigo      Family History   Problem Relation Age of Onset   . Breast cancer Neg Hx      Social History     Socioeconomic History   . Marital status: Widowed     Spouse name: None   . Number of children: None   . Years of education: None   . Highest education level: None   Occupational History   . None   Tobacco Use   . Smoking status: Never Smoker   . Smokeless tobacco: Never Used   Vaping Use   . Vaping Use: Never used   Substance and Sexual Activity   . Alcohol use: Yes     Alcohol/week: 1.0 standard drinks     Types: 1 Cans of beer per week     Comment: socially   . Drug use: No   . Sexual activity: Not Currently   Other Topics Concern   . None   Social History Narrative   . None     Social Determinants of Health     Financial Resource Strain:    . Difficulty of Paying Living Expenses:     Food Insecurity:    . Worried About Programme researcher, broadcasting/film/video in the Last Year:    . Barista in the Last Year:    Transportation Needs:    . Freight forwarder (Medical):    Marland Kitchen Lack of Transportation (Non-Medical):    Physical Activity:    . Days of Exercise per Week:    . Minutes of Exercise per Session:    Stress:    . Feeling of Stress :    Social Connections:    . Frequency of Communication with Friends and Family:    . Frequency of Social Gatherings with Friends and Family:    . Attends Religious Services:    . Active Member of Clubs or Organizations:    . Attends Banker Meetings:    Marland Kitchen Marital Status:    Intimate Partner Violence:    . Fear of Current or Ex-Partner:    . Emotionally Abused:    Marland Kitchen Physically Abused:    . Sexually Abused:          IMPRESSION:  Hypertension controlled  Mild hypercholesterolemia with an LDL of 135 and HDL 60  Normal stress nuclear scan July 2020  Echocardiogram July 2020 showed ejection fraction 60% without significant valvular abnormality      PLAN:   Continue as at present and 6 months follow-up      Royann Shivers, MD [facility]  07/14/2020

## 2020-07-16 ENCOUNTER — Telehealth: Payer: Self-pay

## 2020-07-17 ENCOUNTER — Encounter (INDEPENDENT_AMBULATORY_CARE_PROVIDER_SITE_OTHER): Payer: Self-pay

## 2020-07-20 ENCOUNTER — Telehealth: Payer: Self-pay

## 2020-07-20 ENCOUNTER — Telehealth (INDEPENDENT_AMBULATORY_CARE_PROVIDER_SITE_OTHER): Payer: Self-pay

## 2020-07-20 ENCOUNTER — Encounter: Payer: Self-pay | Admitting: Internal Medicine

## 2020-07-20 NOTE — Pre-Procedure Instructions (Signed)
Do Not Use Right Arm for IV, BP's & Labs

## 2020-07-20 NOTE — Telephone Encounter (Signed)
Patient called in to go over prep instructions for procedure tomorrow (EGD/CLN).  Patient states that she has only had clear liquids today, no solid food.  Patient had not mixed the prep solution as of yet, patient encouraged to mix the gallon of water with prep solution and place in fridge until 5pm. Patient advised to start drinking prep at 5pm, 8oz glass every 10-15 minutes until half the jug was gone.  Patient then told to wake up at 4:45am to finish the prep.  Patient verbalized understanding.

## 2020-07-21 ENCOUNTER — Encounter: Payer: Self-pay | Admitting: Internal Medicine

## 2020-07-21 ENCOUNTER — Ambulatory Visit
Admission: RE | Admit: 2020-07-21 | Discharge: 2020-07-21 | Disposition: A | Discharge status: Home / Self Care | Payer: Medicare Other | Source: Ambulatory Visit | Attending: Internal Medicine | Admitting: Internal Medicine

## 2020-07-21 ENCOUNTER — Encounter
Admission: RE | Disposition: A | Discharge status: Home / Self Care | Payer: Self-pay | Source: Ambulatory Visit | Attending: Internal Medicine

## 2020-07-21 ENCOUNTER — Ambulatory Visit: Payer: Medicare Other | Admitting: Registered Nurse

## 2020-07-21 ENCOUNTER — Ambulatory Visit: Payer: Self-pay

## 2020-07-21 DIAGNOSIS — R1031 Right lower quadrant pain: Secondary | ICD-10-CM | POA: Insufficient documentation

## 2020-07-21 DIAGNOSIS — E785 Hyperlipidemia, unspecified: Secondary | ICD-10-CM | POA: Insufficient documentation

## 2020-07-21 DIAGNOSIS — Z7982 Long term (current) use of aspirin: Secondary | ICD-10-CM | POA: Insufficient documentation

## 2020-07-21 DIAGNOSIS — K529 Noninfective gastroenteritis and colitis, unspecified: Secondary | ICD-10-CM | POA: Insufficient documentation

## 2020-07-21 DIAGNOSIS — I10 Essential (primary) hypertension: Secondary | ICD-10-CM | POA: Insufficient documentation

## 2020-07-21 DIAGNOSIS — D125 Benign neoplasm of sigmoid colon: Secondary | ICD-10-CM | POA: Insufficient documentation

## 2020-07-21 DIAGNOSIS — K3189 Other diseases of stomach and duodenum: Secondary | ICD-10-CM | POA: Insufficient documentation

## 2020-07-21 DIAGNOSIS — Z853 Personal history of malignant neoplasm of breast: Secondary | ICD-10-CM | POA: Insufficient documentation

## 2020-07-21 DIAGNOSIS — E039 Hypothyroidism, unspecified: Secondary | ICD-10-CM | POA: Insufficient documentation

## 2020-07-21 DIAGNOSIS — D509 Iron deficiency anemia, unspecified: Secondary | ICD-10-CM | POA: Insufficient documentation

## 2020-07-21 DIAGNOSIS — K219 Gastro-esophageal reflux disease without esophagitis: Secondary | ICD-10-CM | POA: Insufficient documentation

## 2020-07-21 DIAGNOSIS — R933 Abnormal findings on diagnostic imaging of other parts of digestive tract: Secondary | ICD-10-CM | POA: Insufficient documentation

## 2020-07-21 HISTORY — DX: Other forms of angina pectoris: I20.89

## 2020-07-21 HISTORY — DX: Low back pain, unspecified: M54.50

## 2020-07-21 HISTORY — DX: Headache, unspecified: R51.9

## 2020-07-21 HISTORY — DX: Syncope and collapse: R55

## 2020-07-21 HISTORY — PX: COLONOSCOPY, REMOVAL OF TUMOR, POLYP, OR OTHER LESION BY SNARE TECHNIQUE: SHX3473

## 2020-07-21 HISTORY — PX: EGD, BIOPSY: SHX3796

## 2020-07-21 HISTORY — DX: Other forms of angina pectoris: I20.8

## 2020-07-21 SURGERY — ESOPHAGOGASTRODUODENOSCOPY (EGD), BIOPSY
Anesthesia: Anesthesia General | Site: Mouth | Wound class: Clean Contaminated

## 2020-07-21 MED ORDER — PROPOFOL INFUSION 10 MG/ML
INTRAVENOUS | Status: DC | PRN
Start: 2020-07-21 — End: 2020-07-21
  Administered 2020-07-21 (×3): 50 mg via INTRAVENOUS
  Administered 2020-07-21: 70 mg via INTRAVENOUS
  Administered 2020-07-21 (×2): 50 mg via INTRAVENOUS

## 2020-07-21 MED ORDER — FENTANYL CITRATE (PF) 50 MCG/ML IJ SOLN (WRAP)
INTRAMUSCULAR | Status: AC
Start: 2020-07-21 — End: ?
  Filled 2020-07-21: qty 2

## 2020-07-21 MED ORDER — LIDOCAINE HCL 2 % IJ SOLN
INTRAMUSCULAR | Status: DC | PRN
Start: 2020-07-21 — End: 2020-07-21
  Administered 2020-07-21: 80 mg via INTRAVENOUS

## 2020-07-21 MED ORDER — LACTATED RINGERS IV SOLN
INTRAVENOUS | Status: DC
Start: 2020-07-21 — End: 2020-07-21

## 2020-07-21 MED ORDER — FENTANYL CITRATE (PF) 50 MCG/ML IJ SOLN (WRAP)
INTRAMUSCULAR | Status: DC | PRN
Start: 2020-07-21 — End: 2020-07-21
  Administered 2020-07-21: 25 ug via INTRAVENOUS
  Administered 2020-07-21: 50 ug via INTRAVENOUS
  Administered 2020-07-21: 25 ug via INTRAVENOUS

## 2020-07-21 SURGICAL SUPPLY — 115 items
BALLOON CRE DILTR 12-15MMX8CM (Balloons)
BALLOON FEEDING 24FR X 4.4CM (Endoscopic Supplies) IMPLANT
BELT RB FM UNV 40X6IN LF NS SLD PNL ELC (Patient Supply)
BELT RIB 6IN M (Patient Supply)
BELT RIB UNIVERSAL L40 IN X W6 IN FOAM DEROYAL SOLID PANEL ELASTIC (Patient Supply) IMPLANT
BITE BLOCK MAXI 60F LATEX FREE (Procedure Accessories) ×1
BLOCK BITE OD60 FR STURDY STRAP SIDEPORT (Procedure Accessories) ×2
BLOCK BITE OD60 FR STURDY STRAP SIDEPORT DENTAL RETENTION RIM MAXI (Procedure Accessories) ×2 IMPLANT
BRUSH CYTOLOGY 230CM SHTH BRSTL PSTV RING 3MM 2.1MM CONMED COLONOSCOPE (Brushes) IMPLANT
BRUSH CYTOLOGY DISP 3MMX230CM (Brushes)
BRUSH CYTOLOGY L230 CM SHEATH BRISTLE (Brushes)
CATH BLN DIL 5.5FRX240CMX6-8MM (Balloons)
CATH BLN DIL BOSCI 15-18MMX8CM (Balloons)
CATH CRE 6F 10-12 8X180CM (Balloons)
CATH GOLD PROBE HEMO 7F 300CM (Procedure Accessories)
CATHETER BALLOON DILATATION CRE 2.8 MM (Balloons)
CATHETER BALLOON DILATATION CRE PEBAX (Balloons)
CATHETER BALLOON DILATATION L5.5 CM L240 (Balloons)
CATHETER OD10-11-12 MM ODSEC6 FR L180 CM CRE BALLOON DILATATION L8 CM (Balloons) IMPLANT
CATHETER OD10-11-12 MM ODSEC6 FR L180 CM CREâ„¢ BALLOON DILATATION L8 CM (Balloons) IMPLANT
CATHETER OD15-16.5-18 MM ODSEC6 FR L180 CM CRE BALLOON DILATATION L8 (Balloons) IMPLANT
CATHETER OD15-16.5-18 MM ODSEC6 FR L180 CM CREâ„¢ BALLOON DILATATION L8 (Balloons) IMPLANT
CATHETER OD6 FR ODSEC12-13.5-15 MM L180 CM CRE BALLOON DILATATION L8 (Balloons) IMPLANT
CATHETER OD6 FR ODSEC12-13.5-15 MM L180 CM CREâ„¢ BALLOON DILATATION L8 (Balloons) IMPLANT
CATHETER OD7 FR L300 CM BIPOLAR ROUND (Procedure Accessories)
CATHETER OD7 FR L300 CM BIPOLAR ROUND DISTAL TIP STANDARD CONNECTOR (Procedure Accessories) IMPLANT
CATHETER OD7.5 FR ODSEC12-15 MM L240 CM CRE BALLOON DILATATION L5.5 (Balloons) IMPLANT
CATHETER OD7.5 FR ODSEC12-15 MM L240 CM CREâ„¢ BALLOON DILATATION L5.5 (Balloons) IMPLANT
CATHETER OD7.5 FR ODSEC15-18 MM L240 CM CRE BALLOON DILATATION L5.5 (Balloons) IMPLANT
CATHETER OD7.5 FR ODSEC15-18 MM L240 CM CREâ„¢ BALLOON DILATATION L5.5 (Balloons) IMPLANT
CATHETER OD8-9-10 MM ODSEC7.5 FR L240 CM CRE BALLOON DILATATION L5.5 (Balloons) IMPLANT
CATHETER OD8-9-10 MM ODSEC7.5 FR L240 CM CREâ„¢ BALLOON DILATATION L5.5 (Balloons) IMPLANT
DILATOR ENDOSCOPIC CRE 2.8 MM 3.2 MM (Balloons)
DILATOR ENDOSCOPIC CRE 2.8 MM 3.2 MM PEBAX ESOPHAGEAL PYLORIC COLONIC (Balloons) IMPLANT
DILATOR ENDOSCOPIC CRE 5.5C 240CM 6-7-8MM 7.5FR PEBAX ESOPHAGEAL (Balloons) IMPLANT
DILATOR ENDOSCOPIC CRE PEBAX ESOPHAGEAL (Balloons)
DILATOR WREGDE  BLLN 10-12MM (Balloons)
DILATOR WREGDE  BLLN 12-15MM (Balloons)
DILATOR WREGDE  BLLN 15-18MM (Balloons)
DILATOR WREGDE  BLLN 8-10MM (Balloons)
ELECTRODE ELECTROSURGICAL 168 SQ CM L170 (Procedure Accessories)
ELECTRODE ELECTROSURGICAL 168 SQ CM L170 MM NESSY HYDROGEL SPLIT (Procedure Accessories) IMPLANT
FORCEP HOT BIOPSY RJ4 (Endoscopic Supplies)
FORCEPS BIOPSY L240 CM +2.8 MM HOT OD2.2 (Endoscopic Supplies)
FORCEPS BIOPSY L240 CM +2.8 MM HOT OD2.2 MM RADIAL JAW (Endoscopic Supplies) IMPLANT
FORCEPS BIOPSY L240 CM JUMBO MICROMESH (Instrument)
FORCEPS BIOPSY L240 CM JUMBO MICROMESH TEETH STREAMLINE CATHETER (Instrument) IMPLANT
FORCEPS BIOPSY L240 CM LARGE CAPACITY (Instrument) ×2
FORCEPS BIOPSY L240 CM MICROMESH TEETH STREAMLINE CATHETER NEEDLE (Instrument) IMPLANT
FORCEPS BIOPSY L240 CM STANDARD CAPACITY (Instrument)
FORCEPS JAW RADIAL JUMBO (Instrument)
FORCEPS RAD JAW 4 BIOSPY W/NDL (Instrument) ×1
FORCEPS RADIAL JAW 4 2.8MM (Instrument)
GAUZE SPONGE 4X4 NS (Dressing) ×1
GOWN CHEMOTHERAPY L47 IN X W28 IN UNIVERSAL OPEN BACK OVERHEAD KNIT (Gown) ×4 IMPLANT
GOWN CHMO PP PE UNV 47X28IN LF OPN BCK (Gown) ×4
GOWN IMPERVIOUS ONE-SIZE4-ALL (Gown) ×2
GUIDEWIRE ENDOSCOPIC L200 CM (Guidwire)
GUIDEWIRE ENDOSCOPIC SAVARY-GILLIARD L200 CM FLEXIBLE TIP STAINLESS (Guidwire) IMPLANT
GW SAVARY-GILLIARD SS 200CM (Guidwire)
JELLY LUB EZ LF STRL H2O SOL NGRS TRNLU (Irrigation Solutions) ×2 IMPLANT
JELLY LUBE STRL FLPTOP 4OZ (Irrigation Solutions) ×1
KIT ENDO W/ FOUR PACK BUTTONS (Kits) ×1
KIT ENDOSCOPIC COMPLIANCE ENDOKIT (Kits) ×2
KIT ENDOSCOPIC COMPLIANCE ENDOKIT ORCAPOD 4 1.1 OZ (Kits) ×2 IMPLANT
KIT IRR AQUASHIELD ENDOGATOR 230CM TUBE (Kits) ×2 IMPLANT
KIT IRRIGATION CO2 LONG (Kits) ×1
KIT UNIVERSAL IRRIGATION SOL (Kits) ×3 IMPLANT
LIGATOR DEVICE ENDOSCOPIC (Disposable Instruments)
LIGATOR POLYLOOP DEVICE ENDOSCOPIC (Disposable Instruments) IMPLANT
LOOP LIGATING DISPOSABLE (Disposable Instruments)
MARKER ENDOSCOPIC PERMANENT INDICATION (Syringes, Needles) ×2
MARKER ENDOSCOPIC PERMANENT INDICATION DARK SYRINGE SPOT EX 5 ML (Syringes, Needles) ×2 IMPLANT
MASK FACE FM FLDSHLD LF LVL 3 TIE EYSHLD (Personal Protection) IMPLANT
MASK FLUIDSHIELD NON-FOG SURG (Personal Protection)
NEEDLE CARR-LOCKE INJECT 25GX5 (Needles)
NEEDLE SCLEROTHERAPY CARR-LOCKE OD25 GA ODSEC2.5 MM L230 CM INJECTION (Needles) IMPLANT
NEEDLE SCLEROTHERAPY OD25 GA ODSEC2.5 MM (Needles)
NET ROTH FOREIGN BODY MAXI GI (Disposable Instruments) IMPLANT
NET ROTH FOREIN BODY STNDRD GI (Disposable Instruments) IMPLANT
NET SPECIMEN RETRIEVAL L230 CM STANDARD (Urology Supply)
NET SPECIMEN RETRIEVAL L230 CM STANDARD SHEATH OD2.5 MM L6 CM X W3 CM (Urology Supply) IMPLANT
OVERTUBE ENDOSCOPIC L25 CM STANDARD (Endoscopic Supplies)
OVERTUBE ENDOSCOPIC L25 CM STANDARD TAPER INSUFFLATION CAP OD19.5 MM (Endoscopic Supplies) IMPLANT
OVERTUBE ESOPHAGEAL 8.6-10.0 (Endoscopic Supplies)
PAD GROUNDING NESSY PLATE 170 (Procedure Accessories)
PROBE COAGULATION L7.2 FT (Endoscopic Supplies)
PROBE COAGULATION L7.2 FT CIRCUMFERENTIAL PLUG PLAY FUNCTIONALITY (Endoscopic Supplies) IMPLANT
PROBE ELECTROSURGICAL L220 CM FLEXIBLE (Procedure Accessories)
PROBE ELECTROSURGICAL L220 CM FLEXIBLE STRAIGHT FIRE OD2.3 MM FIAPC (Procedure Accessories) IMPLANT
PROBE FIAPC CIRC 220MM (Endoscopic Supplies)
PROBE FIAPC STRGHT FIRE 220CM (Procedure Accessories)
PROBE GOLD 7FR (Procedure Accessories)
RETRIEVER ROTH NET STD 2.5MM (Urology Supply)
SNARE 9 MM L230 CM OD2.4 MM COLD BRAID (Instrument)
SNARE 9 MM L230 CM OD2.4 MM EXACTO COLD BRAID WIRE CLEAN CUT (Instrument) IMPLANT
SNARE ELECTRO SURG 20MM (Procedure Accessories)
SNARE EXACTO COLD 2.4MMX230CM (Instrument)
SNARE MEDIUM OVAL L240 CM OD2.4 MM (Procedure Accessories)
SNARE MEDIUM OVAL L240 CM OD2.4 MM SENSATION LOOP SHORT THROW FLEXIBLE (Procedure Accessories) IMPLANT
SNARE SENSTN MED OVAL FLX BX40 (Procedure Accessories)
SNARE SPIRAL 230CM 2.8MM 20MM SNRMSTR RDG WIRE ENDOSCOPIC POLYPECTOMY (Procedure Accessories) IMPLANT
SNARE SPIRAL L230 CM OD2.8 MM ODSEC20 MM (Procedure Accessories)
SOL WATER STERILE 1000CC BTLE (Irrigation Solutions) ×1
SPONGE GAUZE L4 IN X W4 IN 16 PLY (Dressing) ×2
SPONGE GAUZE L4 IN X W4 IN 16 PLY MAXIMUM ABSORBENT USP TYPE VII (Dressing) ×2 IMPLANT
SYRING SPOT EX ENDO MARK 5CC (Syringes, Needles) ×1
SYRINGE 50 ML GRADUATE NONPYROGENIC DEHP (Syringes, Needles) ×2
SYRINGE 50 ML GRADUATE NONPYROGENIC DEHP FREE PVC FREE BD MEDICAL (Syringes, Needles) ×2 IMPLANT
SYRINGE SLIP-TIP 60CC (Syringes, Needles) ×1
TRAP MUCUS SCREW CAP TUBE ID LABEL (Procedure Accessories)
TRAP MUCUS SCREW CAP TUBE ID LABEL MEDLINE PLASTIC CLEAR (Procedure Accessories) IMPLANT
TRAP MUCUS SPEC 40CC (Procedure Accessories)
WATER STERILE PLASTIC POUR BOTTLE 1000 (Irrigation Solutions) ×2
WATER STERILE PLASTIC POUR BOTTLE 1000 ML (Irrigation Solutions) ×2 IMPLANT

## 2020-07-21 NOTE — Anesthesia Preprocedure Evaluation (Signed)
Anesthesia Evaluation    AIRWAY    Mallampati: II    TM distance: >3 FB  Neck ROM: full  Mouth Opening:full   CARDIOVASCULAR    cardiovascular exam normal       DENTAL           PULMONARY    pulmonary exam normal     OTHER FINDINGS                  Relevant Problems   PULMONARY   (+) SOB (shortness of breath)      CARDIO   (+) Hypertension      ENDO   (+) Hypothyroid       PSS Anesthesia Comments: HTN  hypothyroidism  GERD  vertigo        Anesthesia Plan    ASA 2     general                     intravenous induction   Detailed anesthesia plan: general IV and MAC            informed consent obtained    Plan discussed with CRNA.      pertinent labs reviewed             Signed by: Andrey Cota, MD 07/21/20 10:17 AM

## 2020-07-21 NOTE — Anesthesia Postprocedure Evaluation (Signed)
Anesthesia Post Evaluation    Patient: Emily [name]    Procedure(s):  EGD, BIOPSY  COLONOSCOPY, POLYPECTOMY    Anesthesia type: general    Last Vitals:   Vitals Value Taken Time   BP 142/75 07/21/20 1145   Temp 36.8 C (98.2 F) 07/21/20 1145   Pulse 82 07/21/20 1145   Resp 20 07/21/20 1145   SpO2 100 % 07/21/20 1145                 Anesthesia Post Evaluation:     Patient Evaluated: PACU    Level of Consciousness: awake and alert    Pain Management: adequate    Airway Patency: patent    Anesthetic complications: No      PONV Status: none    Cardiovascular status: acceptable  Respiratory status: acceptable  Hydration status: acceptable        Signed by: Andrey Cota, MD, 07/21/2020 12:16 PM

## 2020-07-21 NOTE — Progress Notes (Signed)
[address], [address]  [place], [place] [address]  Phone:[phone]  [phone]      NEW PATIENT VISIT    Date of Visit: 07/10/2020 7:42 AM  Patient ID: Emily [name] is a 74 y.o. female.  Referring Physician: @REFPROVFL @       Reason for Consultation:   Colitis    History:   Emily [name] is a 74 y.o. female with a history of breast cancer, status post right breast mastectomy, hypertension, hyperlipidemia, hypothyroidism, chronic vertigo, who presents for follow-up after recent hospital discharge for colitis.    She was recently hospitalized at [facility] in June 2021 with acute onset abdominal pain.  CT abdomen and pelvis showed wall thickening in the right colon.  Lab tests were notable for ESR of 21 and CRP of 24.4.  She was initiated on empiric antibiotic therapy for acute infectious colitis.  Her abdominal pain improved, however she subsequently developed loose stools on day 3.  Stool culture, ova and parasites, and C. difficile test were negative.  She received Imodium as needed for diarrhea.  And was advised to follow-up with GI to rule out IBD. Last colonoscopy more than 10 years ago was reportedly normal.      She is readmitted in August 2021 with dizziness, gait ataxia, tingling and numbness associate with headaches felt to be migraine phenomena for which she received magnesium oxide for headache prevention.    Recent lab testing August 2021 showed anemia with H&H 10.6/34.8%.      Problem List:     Patient Active Problem List   Diagnosis   . Malignant neoplasm of overlapping sites of right female breast   . Hypertension   . Mixed hyperlipidemia   . Hypothyroid   . Right sided numbness   . Chest discomfort   . SOB (shortness of breath)   . Left sided numbness   . Left-sided weakness   . COVID-19 ruled out by laboratory testing   . Acute colitis   . Dizziness   . Right lower quadrant abdominal pain   . Abnormal CT scan, gastrointestinal tract       Past Medical History:     Past  Medical History:   Diagnosis Date   . Breast cancer 2014    right breast mastectomy   . Exercise-induced angina     negative work up   . GERD (gastroesophageal reflux disease)    . Headache     resolved   . Hyperlipidemia    . Hypertension    . Hypothyroidism    . Low back pain    . Malignant neoplasm of overlapping sites of right female breast 10/19/2015   . Syncope and collapse     Chronic Vertigo   . Vertigo        Past Surgical History:     Past Surgical History:   Procedure Laterality Date   . HYSTERECTOMY     . MASTECTOMY Right 2015       Current Medications:   has a current medication list which includes the following prescription(s): amlodipine, anastrozole, aspirin, ezetimibe, lactobacillus/streptococcus, levothyroxine, lisinopril, loperamide, magnesium oxide, meclizine, multivitamin with minerals, pantoprazole, rosuvastatin, vitamin b-1, desonide, and polyethylene glycol-electrolytes.     Outpatient Medications Marked as Taking for the 07/21/20 encounter [facility] Encounter)   Medication Sig Dispense Refill   . amLODIPine (NORVASC) 10 MG tablet Take 1 tablet (10 mg total) by mouth daily 30 tablet 0   . anastrozole (ARIMIDEX) 1  MG tablet Take 1 tablet (1 mg total) by mouth daily (Patient taking differently: Take 1 mg by mouth every morning    ) 90 tablet 3   . Apoaequorin (PREVAGEN PO) Take by mouth daily     . aspirin 81 MG chewable tablet Chew 1 tablet (81 mg total) by mouth daily 90 tablet 0   . ezetimibe (ZETIA) 10 MG tablet Take 1 tablet (10 mg total) by mouth daily 90 tablet 3   . Ferrous Sulfate (IRON PO) Take by mouth     . levothyroxine (SYNTHROID) 25 MCG tablet Take 1 tablet (25 mcg total) by mouth every morning 30 tablet 0   . lisinopril (ZESTRIL) 40 MG tablet Take 1 tablet (40 mg total) by mouth daily 90 tablet 3   . meclizine (ANTIVERT) 25 MG tablet Take 1 tablet (25 mg total) by mouth 3 (three) times daily as needed for Dizziness 10 tablet 0   . Multiple Vitamins-Minerals (MULTIVITAMIN WITH  MINERALS) tablet Take 1 tablet by mouth every morning         . Naproxen Sodium (ALEVE PO) Take by mouth as needed     . simvastatin (ZOCOR) 40 MG tablet Take 40 mg by mouth nightly         Allergies:     Allergies   Allergen Reactions   . Morphine Itching   . Morphine And Related Itching and Nausea And Vomiting     dizzy       Family History:     Family History   Problem Relation Age of Onset   . Breast cancer Neg Hx        Social History:     Social History     Tobacco Use   . Smoking status: Never Smoker   . Smokeless tobacco: Never Used   Vaping Use   . Vaping Use: Never used   Substance Use Topics   . Alcohol use: Yes     Alcohol/week: 1.0 standard drinks     Types: 1 Cans of beer per week     Comment: socially   . Drug use: No       Review of Systems:   Constitutional: Denies fever, chills or weight loss.  Eyes: Denies Blurred vision, eye discharge or eye pain.   ENMT: Denies changes in hearing, nasal discharge, oral lesions or sore throat.  Respiratory: Denies wheezing or cough.  Cardiovascular: Denies chest pain or palpitations.  Gastrointestinal: See HPI.  Genitourinary: Denies gross hematuria or dark urine.  Musculoskeletal: Denies back pain or joint pain.  Neurologic: Denies slurred speech, hemiplegia or focal deficit.  Psychiatric: Denies depression or anxiety.   Hematologic: Denies easy bruising.  Integumentary:  Denies skin rashes or Jaundice.    Vital Signs:   There were no vitals taken for this visit.        Weight Monitoring 05/28/2020 06/25/2020 07/14/2020   Height 160 cm 160 cm 155 cm   Height Method - - -   Weight 71.9 kg 70.489 kg 68.947 kg   Weight Method - - -   BMI (calculated) 28.1 kg/m2 27.6 kg/m2 28.8 kg/m2          Physical Exam:   General appearance - Well developed and well nourished, no acute distress.  Eyes - Sclera anicteric, no ptosis.  ENMT - Mucous membranes moist, nose and ears appear normal.  Oropharynx clear.  Respiratory - Non labored respirations, no audible wheezing.   Cardiovascular - Regular rate and  rhythm, no JVD, no LE edema.  Gastrointestinal - Soft, non-tender, non-distended, no masses, normal bowel sounds.   Musculoskeletal - Normal range of motion of arms and legs.  Neurologic - Alert and oriented to person, place and time.  No gross movement disorders noted.  Psychiatric: Appropriate affect.  Skin: Normal color and turgor, no rashes, no suspicious skin lesions noted.    Labs Reviewed:     Recent Labs     06/29/20  1129 05/28/20  1124 05/12/20  1247   WBC 6.66 5.86 5.77   Hgb 11.1* 10.6* 12.6   Hematocrit 36.6 34.8 41.8   Platelets 330 294 333       Recent Labs     05/28/20  1137 01/27/20  2351 04/15/19  1308   PT 11.6 12.0 12.5*   PT INR 1.0 1.0 0.9       Recent Labs     06/29/20  1129 05/29/20  0516 05/28/20  1124 05/12/20  1350 05/10/20  1245 05/10/20  1245 04/15/19  1308 01/11/19  1710 11/13/18  1206 05/14/18  1024 04/04/18  0659 04/03/18  1352 04/03/18  1352 02/07/18  1837 09/13/17  1252 07/10/17  0628 05/27/17  0602   Sodium  --  142 141 142   < > 143   < > 137   < > 142 143   < > 140   < > 144   < >  --    Potassium  --  4.5 4.0 4.6   < > 4.1   < > 3.5   < > 3.8 4.6   < > 4.6   < > 4.2   < >  --    Chloride  --  105 104 104   < > 105   < > 89*   < > 104 107   < > 106   < > 103   < >  --    CO2  --  25 26 24    < > 24   < > 29   < > 32* 27   < > 25   < > 26   < >  --    BUN  --  7 9 13    < > 14   < > 13   < > 13 17   < > 17   < > 13   < >  --    Creatinine  --  0.8 0.8 0.9   < > 0.8   < > 1.0   < > 0.9 0.9   < > 0.9   < > 0.9   < >  --    Calcium  --  9.3 9.2 9.5   < > 9.7   < > 10.1   < > 8.5 9.3   < > 9.3   < > 9.7   < >  --    Albumin  --   --  3.7 4.2  --  4.3   < > 4.5   < > 3.2*  --    < > 3.7   < > 4.1   < >  --    Protein, Total  --   --  6.1 7.4  --  7.3   < > 7.8   < > 5.3*  --    < > 6.7   < > 7.9   < >  --    Bilirubin, Total  --   --  0.3 0.5  --  0.2   < > 0.3   < > 0.3  --    < > 0.4   < > 0.5   < >  --    Alkaline Phosphatase  --   --  75 74  --  83    < > 122*   < > 83  --    < > 107*   < > 117*   < >  --    ALT  --   --  12 13  --  11   < > 19   < > 15  --    < > 19   < > 20   < >  --    AST (SGOT)  --   --  17 25  --  22   < > 35*   < > 16  --    < > 24   < > 23   < >  --    Glucose  --  92 96 105*   < > 98   < > 106*   < > 102* 94   < > 97   < > 89   < >  --    Lipase  --   --   --   --   --   --   --  40  --   --   --   --  19  --  25  23  --   --    TSH  --   --   --   --   --   --   --   --   --  0.50 1.29  --   --   --   --   --  1.00   Ferritin 24.60  --   --   --   --   --   --   --   --   --   --   --   --   --   --   --   --     < > = values in this interval not displayed.        Rads:   No results found.    GI Procedures:       Assessment and Plan:     Data Reviewed: referring physician note(s) and lab reviewed as above    1. Right lower quadrant abdominal pain    2. Abnormal CT scan, gastrointestinal tract    3. Right sided colitis  - C Reactive Protein; Future  - Stool Calprotectin Immunoassay; Future    4. Anemia, unspecified type  - CBC and differential; Future  - Ferritin; Future  - IRON PROFILE; Future  - Celiac Disease Comprehensive Panel; Future     No orders of the defined types were placed in this encounter.     There are no discontinued medications.     # RLQ abdominal pain right side colitis on CT  Differential diagnoses include inflammatory bowel disease (ulcerative colitis and Crohn's disease), infectious colitis, and rule out neoplasms.  Postinfectious IBS is possible.    #Anemia.  New onset.  Suspect iron deficiency anemia secondary to GI blood loss.     Plan:  -CBC  -Ferritin  -Iron Panel (Iron, transferrin, TIBC and %  transferrin saturation)  - C-reactive protein  - Celiac serology  - Fecal calprotectin for IBD for diarrhea  -  Colonoscopy for lower abdominal pain. Monitored anesthesia care (MAC) for endoscopy. Counseled risks of complications related to endoscopy.   - Lab tests confirmed iron deficiency anemia will consider EGD  with colonoscopy for bidirectional endoscopic evaluation.  - Fiber supplementation: Metamucil over-the-counter, 1 packet once to twice daily.    Follow-up date will be determined when the diagnostic test results are back.  Return if symptoms worsen or fail to improve.    It is a pleasure to participate in the care of Emily [name]. If you have any questions or concerns, please feel free to contact us at [phone].    06/29/20  Lab tests showed iron deficiency anemia.  As we previously discussed, we will add EGD to the colonoscopy on 07/21/2020 for directional endoscopic evaluation.     H&P Update   07/21/2020    H&P/full consultation note reviewed. Physical examination performed.     Physical Exam:  General appearance - Well developed and well nourished, no acute distress.  Respiratory - Non labored respirations, no audible wheezing.  Cardiovascular - Regular rate and rhythm, no JVD, no LE edema.  Gastrointestinal - Soft, non-tender, non-distended, no masses, normal bowel sounds.   Neurologic - Alert and oriented to person, place and time.  No gross movement disorders noted.  Skin: Normal color and turgor, no rashes, no suspicious skin lesions noted.    No other update required.    Einar Grad, MD  [facility]

## 2020-07-21 NOTE — Progress Notes (Signed)
Fall Precautions interventions include the following due to sedation/anesthesia for procedures:    -Physical environment is free of clutter  -The patient is oriented to the environment  -Family members may be at the bedside when appropriate  -Stretchers are in the lowest position with wheels locked  -Non-skid socks are provided as foot covers  -Staff members assist the patient with ambulation to the bathroom  -Call bell in reach if staff member is away

## 2020-07-21 NOTE — Transfer of Care (Signed)
Anesthesia Transfer of Care Note    Patient: Emily [name]    Procedures performed: Procedure(s):  EGD, BIOPSY  COLONOSCOPY, POLYPECTOMY    Anesthesia type: General TIVA    Patient location:Phase II PACU    Last vitals:   Vitals:    07/21/20 1126   BP: 141/67   Pulse: 75   Resp: 21   Temp:    SpO2: 100%       Post pain: Patient not complaining of pain, continue current therapy      Mental Status:awake    Respiratory Function: tolerating room air    Cardiovascular: stable    Nausea/Vomiting: patient not complaining of nausea or vomiting    Hydration Status: adequate    Post assessment: no apparent anesthetic complications, no reportable events and no evidence of recall    Signed by: Laretta Alstrom, CRNA  07/21/20 11:26 AM

## 2020-07-22 ENCOUNTER — Encounter: Payer: Self-pay | Admitting: Internal Medicine

## 2020-07-23 LAB — LAB USE ONLY - HISTORICAL SURGICAL PATHOLOGY

## 2020-07-29 ENCOUNTER — Telehealth (INDEPENDENT_AMBULATORY_CARE_PROVIDER_SITE_OTHER): Payer: Self-pay

## 2020-07-29 NOTE — Progress Notes (Signed)
GI nurses, please call the patient regarding the following.  The patient is not on MyChart.-----------------------Dear [facility],     I have reviewed the pathology report of the polyp(s) removed during your recent endoscopic procedure. 1 polyp was an adenoma. Adenomatous colon polyps are precancerous lesions. Because of your personal history of adenomatous colon polyp(s), I advise that you have a repeat colonoscopy in 5 years to prevent colon cancer in the future.    I have reviewed the pathology report of the biopsies during your recent endoscopic procedure. The result of the biopsies was benign and does not require repeat upper endoscopy for follow-up.     As we previously discussed, please have a CT Enterography to further evaluate for source of iron deficiency anemia and right lower quadrant abdominal pain. Please call [facility] (telephone: [phone]) to make an appointment for your imaging study at an [facility], Imaging Center or Outpatient Center ([email or web address]).     OR     [facility]   [place]   [phone]   [email or web address]    Please call the GI office once you completed the CT enterography study.  Please continue you current medical treatment and follow up with your doctors as previously scheduled.     Please notify the office if you have any questions regarding the pathology report.    Einar Grad, MD  [facility]

## 2020-07-29 NOTE — Telephone Encounter (Signed)
Noted.  Left message for patient to return call to the office.

## 2020-07-29 NOTE — Telephone Encounter (Signed)
-----   Message from Dabo Xu, MD sent at 07/29/2020 12:01 PM EDT -----  GI nurses, please call the patient regarding the following.  The patient is not on MyChart.-----------------------Dear Emily [name],     I have reviewed the pathology report of the polyp(s) removed during your recent endoscopic procedure. 1 polyp was an adenoma. Adenomatous colon polyps are precancerous lesions. Because of your personal history of adenomatous colon polyp(s), I advise that you have a repeat colonoscopy in 5 years to prevent colon cancer in the future.    I have reviewed the pathology report of the biopsies during your recent endoscopic procedure. The result of the biopsies was benign and does not require repeat upper endoscopy for follow-up.     As we previously discussed, please have a CT Enterography to further evaluate for source of iron deficiency anemia and right lower quadrant abdominal pain. Please call [facility] (telephone: [phone]) to make an appointment for your imaging study at an [facility], Imaging Center or Outpatient Center ([email or web address]).     OR     [facility]   [place]   [phone]   [email or web address]    Please call the GI office once you completed the CT enterography study.  Please continue you current medical treatment and follow up with your doctors as previously scheduled.     Please notify the office if you have any questions regarding the pathology report.    Dabo Xu, MD   Gastroenterology

## 2020-07-30 ENCOUNTER — Telehealth (INDEPENDENT_AMBULATORY_CARE_PROVIDER_SITE_OTHER): Payer: Self-pay

## 2020-07-30 NOTE — Telephone Encounter (Signed)
-----   Message from Einar Grad, MD sent at 07/29/2020 12:01 PM EDT -----  GI nurses, please call the patient regarding the following.  The patient is not on MyChart.-----------------------Dear Emily [name],     I have reviewed the pathology report of the polyp(s) removed during your recent endoscopic procedure. 1 polyp was an adenoma. Adenomatous colon polyps are precancerous lesions. Because of your personal history of adenomatous colon polyp(s), I advise that you have a repeat colonoscopy in 5 years to prevent colon cancer in the future.    I have reviewed the pathology report of the biopsies during your recent endoscopic procedure. The result of the biopsies was benign and does not require repeat upper endoscopy for follow-up.     As we previously discussed, please have a CT Enterography to further evaluate for source of iron deficiency anemia and right lower quadrant abdominal pain. Please call [facility] (telephone: [phone]) to make an appointment for your imaging study at an [name], Imaging Center or Outpatient Center ([email or web address]).     OR     [facility]   [place]   [phone]   [email or web address]    Please call the GI office once you completed the CT enterography study.  Please continue you current medical treatment and follow up with your doctors as previously scheduled.     Please notify the office if you have any questions regarding the pathology report.    Einar Grad, MD  [facility]

## 2020-07-30 NOTE — Telephone Encounter (Signed)
Noted.  Left message for patient to return call to the office,

## 2020-07-31 ENCOUNTER — Telehealth (INDEPENDENT_AMBULATORY_CARE_PROVIDER_SITE_OTHER): Payer: Self-pay

## 2020-07-31 NOTE — Telephone Encounter (Signed)
Noted.  Patient notified, verbalized understanding.  Patient given number to schedule CT.

## 2020-07-31 NOTE — Telephone Encounter (Signed)
-----   Message from Dabo Xu, MD sent at 07/29/2020 12:01 PM EDT -----  GI nurses, please call the patient regarding the following.  The patient is not on MyChart.-----------------------Dear Emily [name],     I have reviewed the pathology report of the polyp(s) removed during your recent endoscopic procedure. 1 polyp was an adenoma. Adenomatous colon polyps are precancerous lesions. Because of your personal history of adenomatous colon polyp(s), I advise that you have a repeat colonoscopy in 5 years to prevent colon cancer in the future.    I have reviewed the pathology report of the biopsies during your recent endoscopic procedure. The result of the biopsies was benign and does not require repeat upper endoscopy for follow-up.     As we previously discussed, please have a CT Enterography to further evaluate for source of iron deficiency anemia and right lower quadrant abdominal pain. Please call [facility] (telephone: [phone]) to make an appointment for your imaging study at an [facility], Imaging Center or Outpatient Center ([email or web address]).     OR     [facility]   [place]   [phone]   [email or web address]    Please call the GI office once you completed the CT enterography study.  Please continue you current medical treatment and follow up with your doctors as previously scheduled.     Please notify the office if you have any questions regarding the pathology report.    Dabo Xu, MD  [facility]

## 2020-07-31 NOTE — Telephone Encounter (Signed)
-----   Message from Dabo Xu, MD sent at 07/29/2020 12:01 PM EDT -----  GI nurses, please call the patient regarding the following.  The patient is not on MyChart.-----------------------Dear Emily [name],     I have reviewed the pathology report of the polyp(s) removed during your recent endoscopic procedure. 1 polyp was an adenoma. Adenomatous colon polyps are precancerous lesions. Because of your personal history of adenomatous colon polyp(s), I advise that you have a repeat colonoscopy in 5 years to prevent colon cancer in the future.    I have reviewed the pathology report of the biopsies during your recent endoscopic procedure. The result of the biopsies was benign and does not require repeat upper endoscopy for follow-up.     As we previously discussed, please have a CT Enterography to further evaluate for source of iron deficiency anemia and right lower quadrant abdominal pain. Please call [facility] (telephone: [phone]) to make an appointment for your imaging study at an [facility], Imaging Center or Outpatient Center ([email or web address]).     OR     [facility]   [place]   [phone]   [email or web address]    Please call the GI office once you completed the CT enterography study.  Please continue you current medical treatment and follow up with your doctors as previously scheduled.     Please notify the office if you have any questions regarding the pathology report.    Dabo Xu, MD  [facility]

## 2020-07-31 NOTE — Telephone Encounter (Signed)
Noted.  Left message for patient to return call to the office, called patient's son, he states that he will have patient call the office back.

## 2020-08-30 ENCOUNTER — Emergency Department
Admission: EM | Admit: 2020-08-30 | Discharge: 2020-08-30 | Disposition: A | Discharge status: Home / Self Care | Payer: Medicare Other | Attending: Emergency Medicine | Admitting: Emergency Medicine

## 2020-08-30 ENCOUNTER — Emergency Department: Payer: Medicare Other

## 2020-08-30 DIAGNOSIS — J189 Pneumonia, unspecified organism: Secondary | ICD-10-CM | POA: Insufficient documentation

## 2020-08-30 DIAGNOSIS — Z20822 Contact with and (suspected) exposure to covid-19: Secondary | ICD-10-CM | POA: Insufficient documentation

## 2020-08-30 DIAGNOSIS — R110 Nausea: Secondary | ICD-10-CM | POA: Insufficient documentation

## 2020-08-30 DIAGNOSIS — R079 Chest pain, unspecified: Secondary | ICD-10-CM

## 2020-08-30 DIAGNOSIS — R911 Solitary pulmonary nodule: Secondary | ICD-10-CM | POA: Insufficient documentation

## 2020-08-30 LAB — COMPREHENSIVE METABOLIC PANEL
ALT: 15 U/L (ref 0–55)
AST (SGOT): 25 U/L (ref 5–34)
Albumin/Globulin Ratio: 1.3 (ref 0.9–2.2)
Albumin: 4.4 g/dL (ref 3.5–5.0)
Alkaline Phosphatase: 90 U/L (ref 37–106)
Anion Gap: 12 (ref 5.0–15.0)
BUN: 19 mg/dL (ref 7–19)
Bilirubin, Total: 0.4 mg/dL (ref 0.2–1.2)
CO2: 26 mEq/L (ref 22–29)
Calcium: 10 mg/dL (ref 7.9–10.2)
Chloride: 102 mEq/L (ref 100–111)
Creatinine: 1 mg/dL (ref 0.6–1.0)
Globulin: 3.4 g/dL (ref 2.0–3.6)
Glucose: 108 mg/dL — ABNORMAL HIGH (ref 70–100)
Potassium: 4.4 mEq/L (ref 3.5–5.1)
Protein, Total: 7.8 g/dL (ref 6.0–8.3)
Sodium: 140 mEq/L (ref 136–145)

## 2020-08-30 LAB — CBC AND DIFFERENTIAL
Absolute NRBC: 0 10*3/uL (ref 0.00–0.00)
Basophils Absolute Automated: 0.03 10*3/uL (ref 0.00–0.08)
Basophils Automated: 0.5 %
Eosinophils Absolute Automated: 0.04 10*3/uL (ref 0.00–0.44)
Eosinophils Automated: 0.7 %
Hematocrit: 40.9 % (ref 34.7–43.7)
Hgb: 12.7 g/dL (ref 11.4–14.8)
Immature Granulocytes Absolute: 0.01 10*3/uL (ref 0.00–0.07)
Immature Granulocytes: 0.2 %
Lymphocytes Absolute Automated: 1.74 10*3/uL (ref 0.42–3.22)
Lymphocytes Automated: 28.6 %
MCH: 26.5 pg (ref 25.1–33.5)
MCHC: 31.1 g/dL — ABNORMAL LOW (ref 31.5–35.8)
MCV: 85.4 fL (ref 78.0–96.0)
MPV: 11.1 fL (ref 8.9–12.5)
Monocytes Absolute Automated: 0.42 10*3/uL (ref 0.21–0.85)
Monocytes: 6.9 %
Neutrophils Absolute: 3.85 10*3/uL (ref 1.10–6.33)
Neutrophils: 63.1 %
Nucleated RBC: 0 /100 WBC (ref 0.0–0.0)
Platelets: 354 10*3/uL — ABNORMAL HIGH (ref 142–346)
RBC: 4.79 10*6/uL (ref 3.90–5.10)
RDW: 13 % (ref 11–15)
WBC: 6.09 10*3/uL (ref 3.10–9.50)

## 2020-08-30 LAB — COVID-19 (SARS-COV-2) & INFLUENZA  A/B, NAA (ROCHE LIAT)
Influenza A: NOT DETECTED
Influenza B: NOT DETECTED
SARS CoV 2 Overall Result: NOT DETECTED

## 2020-08-30 LAB — TROPONIN I: Troponin I: <0.01 ng/mL (ref 0.00–0.05)

## 2020-08-30 LAB — GFR: EGFR: >60

## 2020-08-30 MED ORDER — FLUTICASONE PROPIONATE 50 MCG/ACT NA SUSP
2.0000 | Freq: Every day | NASAL | 0 refills | Status: AC
Start: 2020-08-30 — End: 2020-09-06

## 2020-08-30 MED ORDER — AZITHROMYCIN 250 MG PO TABS
ORAL_TABLET | ORAL | 0 refills | Status: AC
Start: 2020-08-30 — End: 2020-09-04

## 2020-08-30 MED ORDER — GUAIFENESIN ER 600 MG PO TB12
1200.0000 mg | ORAL_TABLET | Freq: Two times a day (BID) | ORAL | 0 refills | Status: DC
Start: 2020-08-30 — End: 2022-05-12

## 2020-08-30 NOTE — ED Provider Notes (Signed)
ED PHYSICIAN ASSIGNED     Date/Time Event User Comments           08/30/20 1202 Physician Assigned [facility], Theadora Rama S. Army Fossa, DO assigned as Attending            History     Chief Complaint   Patient presents with   . Cough   . Dizziness     74yo F with history of HTN, HLD, Breast CA, Hypothyroidism, who presents to the ED with URI symptoms. Patient reports cough, mild SOB, nausea, nasal congestion/runny nose with onset around 2 days ago. No relief with over the counter cough medicine. She reports left sided chest pain with coughing. CP is not exertional. She also reports associated fatigue and generalized weakness. Denies sick contacts. Patient is vaccinated against COVID 19 (both shots and booster).            Past Medical History:   Diagnosis Date   . Breast cancer 2014    right breast mastectomy   . Exercise-induced angina     negative work up   . GERD (gastroesophageal reflux disease)    . Headache     resolved   . Hyperlipidemia    . Hypertension    . Hypothyroidism    . Low back pain    . Malignant neoplasm of overlapping sites of right female breast 10/19/2015   . Syncope and collapse     Chronic Vertigo   . Vertigo        Past Surgical History:   Procedure Laterality Date   . COLONOSCOPY, POLYPECTOMY N/A 07/21/2020    Procedure: COLONOSCOPY, POLYPECTOMY;  Surgeon: Einar Grad, MD;  Location: [facility];  Service: Gastroenterology;  Laterality: N/A;   . EGD, BIOPSY N/A 07/21/2020    Procedure: EGD, BIOPSY;  Surgeon: Einar Grad, MD;  Location: [facility];  Service: Gastroenterology;  Laterality: N/A;   . HYSTERECTOMY     . MASTECTOMY Right 2015       Family History   Problem Relation Age of Onset   . Breast cancer Neg Hx        Social  Social History     Tobacco Use   . Smoking status: Never Smoker   . Smokeless tobacco: Never Used   Vaping Use   . Vaping Use: Never used   Substance Use Topics   . Alcohol use: Yes     Alcohol/week: 1.0 standard drink     Types: 1 Cans of beer per week     Comment:  socially   . Drug use: No       .     Allergies   Allergen Reactions   . Morphine Itching   . Morphine And Related Itching and Nausea And Vomiting     dizzy       Home Medications     Med List Status: In Progress Set By: Warner Mccreedy, RN at 08/30/2020 11:53 AM                amLODIPine (NORVASC) 10 MG tablet     Take 1 tablet (10 mg total) by mouth daily     anastrozole (ARIMIDEX) 1 MG tablet     Take 1 tablet (1 mg total) by mouth daily     Patient taking differently: Take 1 mg by mouth every morning         Apoaequorin (PREVAGEN PO)     Take by mouth daily  aspirin 81 MG chewable tablet     Chew 1 tablet (81 mg total) by mouth daily     desonide (DESOWEN) 0.05 % ointment     Apply topically 2 (two) times daily     ezetimibe (ZETIA) 10 MG tablet     Take 1 tablet (10 mg total) by mouth daily     Ferrous Sulfate (IRON PO)     Take by mouth     lactobacillus/streptococcus (RISAQUAD) Cap     Take 1 capsule by mouth daily     levothyroxine (SYNTHROID) 25 MCG tablet     Take 1 tablet (25 mcg total) by mouth every morning     lisinopril (ZESTRIL) 40 MG tablet     Take 1 tablet (40 mg total) by mouth daily     loperamide (IMODIUM) 2 MG capsule     Take 1 capsule (2 mg total) by mouth 4 (four) times daily as needed for Diarrhea     magnesium oxide (MAG-OX) 400 MG tablet     Take 1 tablet (400 mg total) by mouth daily     meclizine (ANTIVERT) 25 MG tablet     Take 1 tablet (25 mg total) by mouth 3 (three) times daily as needed for Dizziness     Multiple Vitamins-Minerals (MULTIVITAMIN WITH MINERALS) tablet     Take 1 tablet by mouth every morning         Naproxen Sodium (ALEVE PO)     Take by mouth as needed     pantoprazole (PROTONIX) 40 MG tablet     Take 1 tablet (40 mg total) by mouth every morning before breakfast     rosuvastatin (CRESTOR) 40 MG tablet     Take 0.5 tablets (20 mg total) by mouth daily     simvastatin (ZOCOR) 40 MG tablet     Take 40 mg by mouth nightly     vitamin B-1 (THIAMINE) 100 MG tablet      Take 100 mg by mouth every morning.           Review of Systems   Constitutional: Positive for fatigue. Negative for fever.   HENT: Positive for congestion.    Respiratory: Positive for cough and shortness of breath.    Cardiovascular: Positive for chest pain.   Gastrointestinal: Positive for nausea. Negative for vomiting.   Neurological: Negative for dizziness and light-headedness.   All other systems reviewed and are negative.      Physical Exam    BP: 149/76, Heart Rate: 87, Temp: 99 F (37.2 C), Resp Rate: 18, SpO2: 99 %, Weight: 69.7 kg    Physical Exam  Constitutional:       Appearance: She is normal weight.   HENT:      Head: Normocephalic.      Nose: Congestion present.      Mouth/Throat:      Mouth: Mucous membranes are moist.   Eyes:      Conjunctiva/sclera: Conjunctivae normal.      Pupils: Pupils are equal, round, and reactive to light.   Cardiovascular:      Rate and Rhythm: Normal rate and regular rhythm.      Pulses: Normal pulses.      Heart sounds: Normal heart sounds.   Pulmonary:      Effort: Pulmonary effort is normal. No respiratory distress.      Breath sounds: No wheezing or rhonchi.   Abdominal:      Palpations: Abdomen is soft.  Tenderness: There is no abdominal tenderness.   Musculoskeletal:         General: No swelling or tenderness. Normal range of motion.      Cervical back: Normal range of motion.   Skin:     General: Skin is warm and dry.   Neurological:      General: No focal deficit present.      Mental Status: She is alert. Mental status is at baseline.   Psychiatric:         Mood and Affect: Mood normal.         Behavior: Behavior normal.           MDM and ED Course     ED Medication Orders (From admission, onward)    None         EKG:   Date: Aug 30, 2020, Time: 12:05  Normal sinus rhythm  T wave inversions in inferior and lateral leads.   No ST elevations or depressions  Interpretation: Nonspecific EKG    Labs Reviewed   CBC AND DIFFERENTIAL - Abnormal; Notable for the  following components:       Result Value    Platelets 354 (*)     MCHC 31.1 (*)     All other components within normal limits   COMPREHENSIVE METABOLIC PANEL - Abnormal; Notable for the following components:    Glucose 108 (*)     All other components within normal limits   COVID-19 (SARS-COV-2) & INFLUENZA  A/B, NAA (ROCHE LIAT)    Narrative:     o Collect and clearly label specimen type:  o PREFERRED-Upper respiratory specimen: One Nasopharyngeal  Swab in Transport Media.  o Hand deliver to laboratory ASAP  Diagnostic -PUI   TROPONIN I   GFR     XR Chest 2 Views   Final Result       1. No focal consolidations. Trace right pleural effusion versus mild   pleural thickening.   2. Nodular opacity projecting over the right lower lung is most likely   related to the overlying soft tissues however a pulmonary nodule cannot   be excluded. Chest CT is recommended for further evaluation in a   nonurgent setting.      Gustavus Messing, MD    08/30/2020 12:51 PM                MDM  Number of Diagnoses or Management Options  Atypical pneumonia  Pulmonary nodule seen on imaging study  Diagnosis management comments: The patient is nontoxic and well appearing. She is no respiratory distress. O2 sats 98% on room air. Her labs are unremarkable. History and exam most consistent with infectious process. COVID 19 and Influenza are negative. CXR without obvious pneumonia, possible pulmonary nodule noted. Informed patient of need for follow up outpatient CT scan of chest to further evaluate. Discussed supportive care to include flonase, mucinex, tylenol. Given age, history, and risk factors will give course of antibiotics to take if not improving or symptoms worsen. Recommended follow up with primary care doctor. Return precautions given. All questions answered. Patient aware and comfortable with plan.         Amount and/or Complexity of Data Reviewed  Clinical lab tests: ordered and reviewed  Tests in the radiology section of CPT: ordered  and reviewed  Tests in the medicine section of CPT: ordered and reviewed  Review and summarize past medical records: yes    Patient Progress  Patient progress: stable  Procedures    Clinical Impression & Disposition     Clinical Impression  Final diagnoses:   Atypical pneumonia   Pulmonary nodule seen on imaging study        ED Disposition     ED Disposition Condition Date/Time Comment    Discharge  Sun Aug 30, 2020  1:15 PM Emily [name] discharge to home/self care.    Condition at disposition: Stable             New Prescriptions    AZITHROMYCIN (ZITHROMAX) 250 MG TABLET    Take 2 tablets (500 mg total) by mouth daily for 1 day, THEN 1 tablet (250 mg total) daily for 4 days.    FLUTICASONE (FLONASE) 50 MCG/ACT NASAL SPRAY    2 sprays by Nasal route daily for 7 days    GUAIFENESIN (MUCINEX) 600 MG 12 HR TABLET    Take 2 tablets (1,200 mg total) by mouth 2 (two) times daily                 Army Fossa, DO  08/30/20 1316

## 2020-08-30 NOTE — ED Triage Notes (Signed)
Pt c/o cough, dizziness nausea and generalized weakness starting three days ago.Chills and sweats starting yesterday. Pt alert &ox4. Resp even and non-labored. Skin warm and dry.

## 2020-08-30 NOTE — Discharge Instructions (Signed)
You should follow up with your primary care doctor in the next 3-5 days. Your chest x-ray showed a possible pulmonary nodule on the right. You should have either a repeat chest x-ray or CT scan to further evaluate this. Return to the ED for worsening symptoms, fever, difficulty breathing, chest pain, or other worrisome symptoms.

## 2020-08-30 NOTE — EDIE (Signed)
COLLECTIVE?NOTIFICATION?08/30/2020 11:34?Emily [name], Emily [name]?MRN: [id number]    [facility] [place] [facility]'s patient encounter information:   VWU:?[id number]  Account [id number]  Billing Account [id number]      Criteria Met      5 ED Visits in 12 Months    Security and Safety  No recent Security Events currently on file    ED Care Guidelines  There are currently no ED Care Guidelines for this patient. Please check your facility's medical records system.        Prescription Monitoring Program  000??- Narcotic Use Score  000??- Sedative Use Score  000??- Stimulant Use Score  000??- Overdose Risk Score  - All Scores range from 000-999 with 75% of the population scoring < 200 and on 1% scoring above 650  - The last digit of the narcotic, sedative, and stimulant score indicates the number of active prescriptions of that type  - Higher Use scores correlate with increased prescribers, pharmacies, mg equiv, and overlapping prescriptions  - Higher Overdose Risk Scores correlate with increased risk of unintentional overdose death   Concerning or unexpectedly high scores should prompt a review of the PMP record; this does not constitute checking PMP for prescribing purposes.      E.D. Visit Count (12 mo.)  Facility Visits   [place] [facility] 8   Total 8   Note: Visits indicate total known visits.     Recent Emergency Department Visit Summary  Date Facility [facility] Type Diagnoses or Chief Complaint   Aug 30, 2020 [place] [facility]      headache,vomiting,back pain      May 28, 2020 [place] [facility]      vaginal/rectal bleeding/dizziness      Headache      Tingling      Dizziness and giddiness      May 12, 2020 [place] [facility]      Chest Pain,Tingling,Weak      Tingling      Headache      Chest Pain      Back Pain      Chest pain, unspecified      Mastodynia      Headache, unspecified      Weakness      Paresthesia of skin       May 10, 2020 [place] [facility]      weakness. lump by lt ear      Fatigue      Chest Pain      Generalized weakness      Pruritus, unspecified      Other chest pain      Mar 10, 2020 [place] [facility]      abd pain      Generalized weakness      Abdominal Pain      Contact with and (suspected) exposure to covid-19      Mar 08, 2020 [place] [facility]      fatige,      fatige, weakness      Breast Pain      Other chest pain      Chest pain, unspecified      Other specified anxiety disorders      Acquired absence of right breast and nipple      Jan 27, 2020 [place] [facility] [place] [facility]      possible stroke      Numbness      Dec 28, 2019 [place] [facility]      both breast pain      Chest Pain      Mastodynia      Secondary hypertension, unspecified          Recent Inpatient Visit Summary  Date Facility [facility] Type Diagnoses or Chief Complaint   Mar 10, 2020 [place] [facility]      Contact with and (suspected) exposure to covid-19      Noninfective gastroenteritis and colitis, unspecified      Jan 27, 2020 [place] [facility]      Weakness          Care Team  Provider Specialty Phone Fax Service Dates   DAVIDSON, Janet Berlin MD M, MD Family Medicine: Adult Medicine [phone] Current      Collective Portal  This patient has registered at the [facility] Emergency Department   For more information visit: [email or web address]     PLEASE NOTE:     1.   Any care recommendations and other clinical information are provided as guidelines or for historical purposes only, and providers should exercise their own clinical judgment when providing care.    2.   You may only use this information for purposes of treatment, payment or health care operations  activities, and subject to the limitations of applicable Collective Policies.    3.   You should consult directly with the organization that provided a care guideline or other clinical history with any questions about additional information or accuracy or completeness of information provided.    ? 2021 [place], [employer] - [email or web address]

## 2020-08-31 ENCOUNTER — Ambulatory Visit (INDEPENDENT_AMBULATORY_CARE_PROVIDER_SITE_OTHER): Payer: Medicare Other | Admitting: Cardiology

## 2020-08-31 ENCOUNTER — Encounter (INDEPENDENT_AMBULATORY_CARE_PROVIDER_SITE_OTHER): Payer: Self-pay

## 2020-08-31 ENCOUNTER — Encounter (INDEPENDENT_AMBULATORY_CARE_PROVIDER_SITE_OTHER): Payer: Self-pay | Admitting: Cardiology

## 2020-08-31 VITALS — BP 123/74 | HR 85 | Resp 16 | Ht 61.0 in | Wt 160.0 lb

## 2020-08-31 DIAGNOSIS — I10 Essential (primary) hypertension: Secondary | ICD-10-CM

## 2020-08-31 LAB — ECG 12-LEAD
Atrial Rate: 96 {beats}/min
P Axis: 47 degrees
P-R Interval: 158 ms
Q-T Interval: 352 ms
QRS Duration: 82 ms
QTC Calculation (Bezet): 444 ms
R Axis: 35 degrees
T Axis: 257 degrees
Ventricular Rate: 96 {beats}/min

## 2020-08-31 NOTE — Progress Notes (Signed)
[place] [facility] PROGRESS NOTE    I had the pleasure of seeing Emily [name] today for cardiovascular follow up. He is a pleasant 74 y.o. female with a  history ofatypical chest discomfort July 2020 that led to a normal Lexiscan nuclear scan and echocardiogram both July 2020 with ejection fraction of 60% and no significant valvular abnormality.  The patient went to the emergency room yesterday with cough nausea nasal congestion.  The patient's notices left chest discomfort under her left breast when she pushes up on her left breast is tender.    Chest x-ray shows a question of a nodular opacity projecting over the right lung and that a pulmonary nodule cannot be excluded    MEDICATIONS:  Current Outpatient Medications   Medication Sig Dispense Refill   . amLODIPine (NORVASC) 10 MG tablet Take 1 tablet (10 mg total) by mouth daily 30 tablet 0   . anastrozole (ARIMIDEX) 1 MG tablet Take 1 tablet (1 mg total) by mouth daily (Patient taking differently: Take 1 mg by mouth every morning    ) 90 tablet 3   . Apoaequorin (PREVAGEN PO) Take by mouth daily     . aspirin 81 MG chewable tablet Chew 1 tablet (81 mg total) by mouth daily 90 tablet 0   . azithromycin (ZITHROMAX) 250 MG tablet Take 2 tablets (500 mg total) by mouth daily for 1 day, THEN 1 tablet (250 mg total) daily for 4 days. 6 tablet 0   . desonide (DESOWEN) 0.05 % ointment Apply topically 2 (two) times daily 15 g 0   . ezetimibe (ZETIA) 10 MG tablet Take 1 tablet (10 mg total) by mouth daily 90 tablet 3   . Ferrous Sulfate (IRON PO) Take by mouth     . fluticasone (FLONASE) 50 MCG/ACT nasal spray 2 sprays by Nasal route daily for 7 days 9.9 mL 0   . guaiFENesin (MUCINEX) 600 MG 12 hr tablet Take 2 tablets (1,200 mg total) by mouth 2 (two) times daily 14 tablet 0   . lactobacillus/streptococcus (RISAQUAD) Cap Take 1 capsule by mouth daily 10 capsule 0   . levothyroxine (SYNTHROID) 25 MCG tablet Take 1 tablet (25 mcg total) by mouth every morning 30 tablet 0    . lisinopril (ZESTRIL) 40 MG tablet Take 1 tablet (40 mg total) by mouth daily 90 tablet 3   . loperamide (IMODIUM) 2 MG capsule Take 1 capsule (2 mg total) by mouth 4 (four) times daily as needed for Diarrhea 10 capsule 0   . magnesium oxide (MAG-OX) 400 MG tablet Take 1 tablet (400 mg total) by mouth daily 30 tablet 0   . meclizine (ANTIVERT) 25 MG tablet Take 1 tablet (25 mg total) by mouth 3 (three) times daily as needed for Dizziness 10 tablet 0   . Multiple Vitamins-Minerals (MULTIVITAMIN WITH MINERALS) tablet Take 1 tablet by mouth every morning         . Naproxen Sodium (ALEVE PO) Take by mouth as needed     . pantoprazole (PROTONIX) 40 MG tablet Take 1 tablet (40 mg total) by mouth every morning before breakfast 30 tablet 0   . rosuvastatin (CRESTOR) 40 MG tablet Take 0.5 tablets (20 mg total) by mouth daily 90 tablet 3   . simvastatin (ZOCOR) 40 MG tablet Take 40 mg by mouth nightly     . vitamin B-1 (THIAMINE) 100 MG tablet Take 100 mg by mouth every morning.  REVIEW OF SYSTEMS: All other systems reviewed and negative except as above.    PHYSICAL EXAMINATION  General Appearance: well-appearing and in no acute distress.   Vital Signs: BP 123/74 (BP Site: Left arm, Patient Position: Sitting, Cuff Size: Large)   Pulse 85   Resp 16   Ht 1.549 m (5\' 1" )   Wt 72.6 kg (160 lb)   SpO2 97%   BMI 30.23 kg/m          Past Medical History:   Diagnosis Date   . Breast cancer 2014    right breast mastectomy   . Exercise-induced angina     negative work up   . GERD (gastroesophageal reflux disease)    . Headache     resolved   . Hyperlipidemia    . Hypertension    . Hypothyroidism    . Low back pain    . Malignant neoplasm of overlapping sites of right female breast 10/19/2015   . Syncope and collapse     Chronic Vertigo   . Vertigo      Family History   Problem Relation Age of Onset   . Breast cancer Neg Hx      Social History     Socioeconomic History   . Marital status: Widowed     Spouse name: None    . Number of children: None   . Years of education: None   . Highest education level: None   Occupational History   . None   Tobacco Use   . Smoking status: Never Smoker   . Smokeless tobacco: Never Used   Vaping Use   . Vaping Use: Never used   Substance and Sexual Activity   . Alcohol use: Yes     Alcohol/week: 1.0 standard drink     Types: 1 Cans of beer per week     Comment: socially   . Drug use: No   . Sexual activity: Not Currently   Other Topics Concern   . None   Social History Narrative   . None     Social Determinants of Health     Financial Resource Strain:    . Difficulty of Paying Living Expenses: Not on file   Food Insecurity:    . Worried About Programme researcher, broadcasting/film/video in the Last Year: Not on file   . Ran Out of Food in the Last Year: Not on file   Transportation Needs:    . Lack of Transportation (Medical): Not on file   . Lack of Transportation (Non-Medical): Not on file   Physical Activity:    . Days of Exercise per Week: Not on file   . Minutes of Exercise per Session: Not on file   Stress:    . Feeling of Stress : Not on file   Social Connections:    . Frequency of Communication with Friends and Family: Not on file   . Frequency of Social Gatherings with Friends and Family: Not on file   . Attends Religious Services: Not on file   . Active Member of Clubs or Organizations: Not on file   . Attends Banker Meetings: Not on file   . Marital Status: Not on file   Intimate Partner Violence:    . Fear of Current or Ex-Partner: Not on file   . Emotionally Abused: Not on file   . Physically Abused: Not on file   . Sexually Abused: Not on file   Housing Stability:    .  Unable to Pay for Housing in the Last Year: Not on file   . Number of Places Lived in the Last Year: Not on file   . Unstable Housing in the Last Year: Not on file         IMPRESSION:  Hypertension controlled  Mild hypercholesterolemia with an LDL of 135 and HDL 60  Normal stress nuclear scan July 2020  Echocardiogram July 2020  showed ejection fraction 60% without significant valvular abnormality  Upper respiratory infection  Possible pulmonary nodule    PLAN:   Follow-up with Dr. Abran Cantor regarding the possible pulmonary nodule  No additional cardiac evaluation is required at this time      Royann Shivers, MD   08/31/2020

## 2020-09-02 ENCOUNTER — Telehealth (INDEPENDENT_AMBULATORY_CARE_PROVIDER_SITE_OTHER): Payer: Self-pay | Admitting: Internal Medicine

## 2020-09-02 NOTE — Telephone Encounter (Signed)
GI team,    Please call the patient regarding her question. Nurse Tresa Endo called her on 07/31/2020 regarding the pathology report and my recommendation of CT Enterography to further evaluate for source of iron deficiency anemia and right lower quadrant abdominal pain. She has a CT scheduled for tomorrow.    Einar Grad, MD  [facility] Gastroenterology

## 2020-09-02 NOTE — Telephone Encounter (Signed)
Pt is calling in two weeks after her procedure. Please give her a call back to discuss her results.

## 2020-09-03 ENCOUNTER — Ambulatory Visit: Payer: Medicare Other

## 2020-09-03 ENCOUNTER — Telehealth (INDEPENDENT_AMBULATORY_CARE_PROVIDER_SITE_OTHER): Payer: Self-pay

## 2020-09-03 NOTE — Telephone Encounter (Signed)
Pt was not aware of CT procedure today. Gave information to [employer], FFX. Pt may reschedule

## 2020-09-03 NOTE — Telephone Encounter (Signed)
LVMTCB

## 2020-09-25 ENCOUNTER — Other Ambulatory Visit: Payer: Self-pay | Admitting: Internal Medicine

## 2020-09-25 DIAGNOSIS — I10 Essential (primary) hypertension: Secondary | ICD-10-CM

## 2020-09-25 DIAGNOSIS — R1013 Epigastric pain: Secondary | ICD-10-CM

## 2020-09-27 ENCOUNTER — Emergency Department: Payer: Medicare Other

## 2020-09-27 ENCOUNTER — Observation Stay: Payer: Medicare Other

## 2020-09-27 ENCOUNTER — Observation Stay
Admission: EM | Admit: 2020-09-27 | Discharge: 2020-09-28 | Disposition: A | Discharge status: Home / Self Care | Payer: Medicare Other | Attending: Internal Medicine | Admitting: Internal Medicine

## 2020-09-27 DIAGNOSIS — E782 Mixed hyperlipidemia: Secondary | ICD-10-CM | POA: Diagnosis present

## 2020-09-27 DIAGNOSIS — R531 Weakness: Principal | ICD-10-CM | POA: Insufficient documentation

## 2020-09-27 DIAGNOSIS — E079 Disorder of thyroid, unspecified: Secondary | ICD-10-CM | POA: Insufficient documentation

## 2020-09-27 DIAGNOSIS — R0789 Other chest pain: Secondary | ICD-10-CM | POA: Insufficient documentation

## 2020-09-27 DIAGNOSIS — Z8639 Personal history of other endocrine, nutritional and metabolic disease: Secondary | ICD-10-CM

## 2020-09-27 DIAGNOSIS — M47812 Spondylosis without myelopathy or radiculopathy, cervical region: Secondary | ICD-10-CM | POA: Insufficient documentation

## 2020-09-27 DIAGNOSIS — R202 Paresthesia of skin: Secondary | ICD-10-CM | POA: Insufficient documentation

## 2020-09-27 DIAGNOSIS — Z170 Estrogen receptor positive status [ER+]: Secondary | ICD-10-CM | POA: Insufficient documentation

## 2020-09-27 DIAGNOSIS — Z9011 Acquired absence of right breast and nipple: Secondary | ICD-10-CM | POA: Insufficient documentation

## 2020-09-27 DIAGNOSIS — Z79899 Other long term (current) drug therapy: Secondary | ICD-10-CM | POA: Insufficient documentation

## 2020-09-27 DIAGNOSIS — R200 Anesthesia of skin: Secondary | ICD-10-CM | POA: Insufficient documentation

## 2020-09-27 DIAGNOSIS — K219 Gastro-esophageal reflux disease without esophagitis: Secondary | ICD-10-CM | POA: Insufficient documentation

## 2020-09-27 DIAGNOSIS — R0602 Shortness of breath: Secondary | ICD-10-CM | POA: Insufficient documentation

## 2020-09-27 DIAGNOSIS — Z885 Allergy status to narcotic agent status: Secondary | ICD-10-CM | POA: Insufficient documentation

## 2020-09-27 DIAGNOSIS — I10 Essential (primary) hypertension: Secondary | ICD-10-CM | POA: Insufficient documentation

## 2020-09-27 DIAGNOSIS — I6789 Other cerebrovascular disease: Secondary | ICD-10-CM | POA: Insufficient documentation

## 2020-09-27 DIAGNOSIS — E039 Hypothyroidism, unspecified: Secondary | ICD-10-CM | POA: Insufficient documentation

## 2020-09-27 DIAGNOSIS — Z853 Personal history of malignant neoplasm of breast: Secondary | ICD-10-CM | POA: Insufficient documentation

## 2020-09-27 DIAGNOSIS — Z20822 Contact with and (suspected) exposure to covid-19: Secondary | ICD-10-CM | POA: Insufficient documentation

## 2020-09-27 DIAGNOSIS — M47814 Spondylosis without myelopathy or radiculopathy, thoracic region: Secondary | ICD-10-CM | POA: Insufficient documentation

## 2020-09-27 LAB — COMPREHENSIVE METABOLIC PANEL
ALT: 15 U/L (ref 0–55)
AST (SGOT): 19 U/L (ref 5–34)
Albumin/Globulin Ratio: 1.3 (ref 0.9–2.2)
Albumin: 4.1 g/dL (ref 3.5–5.0)
Alkaline Phosphatase: 97 U/L (ref 37–106)
Anion Gap: 7 (ref 5.0–15.0)
BUN: 13 mg/dL (ref 7–19)
Bilirubin, Total: 0.4 mg/dL (ref 0.2–1.2)
CO2: 29 mEq/L (ref 22–29)
Calcium: 9.5 mg/dL (ref 7.9–10.2)
Chloride: 107 mEq/L (ref 100–111)
Creatinine: 0.9 mg/dL (ref 0.6–1.0)
Globulin: 3.2 g/dL (ref 2.0–3.6)
Glucose: 96 mg/dL (ref 70–100)
Potassium: 4 mEq/L (ref 3.5–5.1)
Protein, Total: 7.3 g/dL (ref 6.0–8.3)
Sodium: 143 mEq/L (ref 136–145)

## 2020-09-27 LAB — CBC AND DIFFERENTIAL
Absolute NRBC: 0 10*3/uL (ref 0.00–0.00)
Basophils Absolute Automated: 0.05 10*3/uL (ref 0.00–0.08)
Basophils Automated: 0.9 %
Eosinophils Absolute Automated: 0.11 10*3/uL (ref 0.00–0.44)
Eosinophils Automated: 2 %
Hematocrit: 41.6 % (ref 34.7–43.7)
Hgb: 12.5 g/dL (ref 11.4–14.8)
Immature Granulocytes Absolute: 0.01 10*3/uL (ref 0.00–0.07)
Immature Granulocytes: 0.2 %
Lymphocytes Absolute Automated: 1.86 10*3/uL (ref 0.42–3.22)
Lymphocytes Automated: 34.2 %
MCH: 26.3 pg (ref 25.1–33.5)
MCHC: 30 g/dL — ABNORMAL LOW (ref 31.5–35.8)
MCV: 87.4 fL (ref 78.0–96.0)
MPV: 11 fL (ref 8.9–12.5)
Monocytes Absolute Automated: 0.44 10*3/uL (ref 0.21–0.85)
Monocytes: 8.1 %
Neutrophils Absolute: 2.97 10*3/uL (ref 1.10–6.33)
Neutrophils: 54.6 %
Nucleated RBC: 0 /100 WBC (ref 0.0–0.0)
Platelets: 309 10*3/uL (ref 142–346)
RBC: 4.76 10*6/uL (ref 3.90–5.10)
RDW: 14 % (ref 11–15)
WBC: 5.44 10*3/uL (ref 3.10–9.50)

## 2020-09-27 LAB — GFR: EGFR: >60

## 2020-09-27 LAB — PT AND APTT
PT INR: 1 (ref 0.9–1.1)
PT: 11.4 s (ref 10.1–12.9)
PTT: 38 s (ref 27–39)

## 2020-09-27 LAB — COVID-19 (SARS-COV-2): SARS CoV 2 Overall Result: NEGATIVE

## 2020-09-27 LAB — TROPONIN I: Troponin I: <0.01 ng/mL (ref 0.00–0.05)

## 2020-09-27 MED ORDER — ACETAMINOPHEN 325 MG PO TABS
650.0000 mg | ORAL_TABLET | Freq: Four times a day (QID) | ORAL | Status: DC | PRN
Start: 2020-09-27 — End: 2020-09-28
  Administered 2020-09-28: 650 mg via ORAL
  Filled 2020-09-27: qty 2

## 2020-09-27 MED ORDER — ONDANSETRON 4 MG PO TBDP
4.0000 mg | ORAL_TABLET | Freq: Four times a day (QID) | ORAL | Status: DC | PRN
Start: 2020-09-27 — End: 2020-09-28

## 2020-09-27 MED ORDER — ANASTROZOLE 1 MG PO TABS
1.0000 mg | ORAL_TABLET | Freq: Every day | ORAL | Status: DC
  Administered 2020-09-28: 1 mg via ORAL
  Filled 2020-09-27: qty 1

## 2020-09-27 MED ORDER — GADOTERATE MEGLUMINE 7.5 MMOL/15ML IV SOLN (CLARISCAN)
0.2000 mL/kg | Freq: Once | INTRAVENOUS | Status: AC | PRN
Start: 2020-09-27 — End: 2020-09-27
  Administered 2020-09-27: 14 mL via INTRAVENOUS

## 2020-09-27 MED ORDER — ASPIRIN 81 MG PO CHEW
81.0000 mg | CHEWABLE_TABLET | Freq: Every day | ORAL | Status: DC
Start: 2020-09-28 — End: 2020-09-28
  Administered 2020-09-28: 81 mg via ORAL
  Filled 2020-09-27: qty 1

## 2020-09-27 MED ORDER — ACETAMINOPHEN 650 MG RE SUPP
650.0000 mg | Freq: Four times a day (QID) | RECTAL | Status: DC | PRN
Start: 2020-09-27 — End: 2020-09-28

## 2020-09-27 MED ORDER — HEPARIN SODIUM (PORCINE) 5000 UNIT/ML IJ SOLN
5000.0000 [IU] | Freq: Two times a day (BID) | INTRAMUSCULAR | Status: DC
Start: 2020-09-27 — End: 2020-09-28
  Administered 2020-09-27 – 2020-09-28 (×2): 5000 [IU] via SUBCUTANEOUS
  Filled 2020-09-27 (×2): qty 1

## 2020-09-27 MED ORDER — PANTOPRAZOLE SODIUM 40 MG PO TBEC
40.0000 mg | DELAYED_RELEASE_TABLET | Freq: Every morning | ORAL | Status: DC
Start: 2020-09-28 — End: 2020-09-28
  Administered 2020-09-28: 40 mg via ORAL
  Filled 2020-09-27: qty 1

## 2020-09-27 MED ORDER — GLUCAGON 1 MG IJ SOLR (WRAP)
1.0000 mg | INTRAMUSCULAR | Status: DC | PRN
Start: 2020-09-27 — End: 2020-09-28

## 2020-09-27 MED ORDER — ROSUVASTATIN CALCIUM 10 MG PO TABS
20.0000 mg | ORAL_TABLET | Freq: Every day | ORAL | Status: DC
Start: 2020-09-28 — End: 2020-09-28
  Administered 2020-09-28: 20 mg via ORAL
  Filled 2020-09-27: qty 2

## 2020-09-27 MED ORDER — THIAMINE (VITAMIN B1) 100 MG PO TABS (WRAP)
100.0000 mg | ORAL_TABLET | Freq: Every morning | ORAL | Status: DC
Start: 2020-09-28 — End: 2020-09-28
  Administered 2020-09-28: 100 mg via ORAL
  Filled 2020-09-27: qty 1

## 2020-09-27 MED ORDER — ASPIRIN 325 MG PO TABS
325.0000 mg | ORAL_TABLET | Freq: Every day | ORAL | Status: DC
Start: 2020-09-27 — End: 2020-09-27

## 2020-09-27 MED ORDER — LEVOTHYROXINE SODIUM 25 MCG PO TABS
25.0000 ug | ORAL_TABLET | Freq: Every day | ORAL | Status: DC
Start: 2020-09-28 — End: 2020-09-28
  Administered 2020-09-28: 25 ug via ORAL
  Filled 2020-09-27: qty 1

## 2020-09-27 MED ORDER — LISINOPRIL 20 MG PO TABS
40.0000 mg | ORAL_TABLET | Freq: Every day | ORAL | Status: DC
Start: 2020-09-28 — End: 2020-09-28
  Administered 2020-09-28: 40 mg via ORAL
  Filled 2020-09-27: qty 2

## 2020-09-27 MED ORDER — MELATONIN 3 MG PO TABS
3.0000 mg | ORAL_TABLET | Freq: Every evening | ORAL | Status: DC | PRN
Start: 2020-09-27 — End: 2020-09-28

## 2020-09-27 MED ORDER — NALOXONE HCL 0.4 MG/ML IJ SOLN (WRAP)
0.2000 mg | INTRAMUSCULAR | Status: DC | PRN
Start: 2020-09-27 — End: 2020-09-28

## 2020-09-27 MED ORDER — AMLODIPINE BESYLATE 5 MG PO TABS
10.0000 mg | ORAL_TABLET | Freq: Every day | ORAL | Status: DC
Start: 2020-09-28 — End: 2020-09-28
  Administered 2020-09-28: 10 mg via ORAL
  Filled 2020-09-27: qty 2

## 2020-09-27 MED ORDER — ASPIRIN 300 MG RE SUPP
300.0000 mg | Freq: Once | RECTAL | Status: AC
Start: 2020-09-27 — End: 2020-09-27
  Administered 2020-09-27: 300 mg via RECTAL
  Filled 2020-09-27: qty 1

## 2020-09-27 MED ORDER — DEXTROSE 50 % IV SOLN
12.5000 g | INTRAVENOUS | Status: DC | PRN
Start: 2020-09-27 — End: 2020-09-28

## 2020-09-27 MED ORDER — ONDANSETRON HCL 4 MG/2ML IJ SOLN
4.0000 mg | Freq: Four times a day (QID) | INTRAMUSCULAR | Status: DC | PRN
Start: 2020-09-27 — End: 2020-09-28

## 2020-09-27 MED ORDER — GLUCOSE 40 % PO GEL
15.0000 g | ORAL | Status: DC | PRN
Start: 2020-09-27 — End: 2020-09-28

## 2020-09-27 NOTE — H&P (Addendum)
ADMISSION HISTORY AND PHYSICAL EXAM    [facility], DIVISION OF HOSPITALIST MEDICINE   [facility]   [place] Pager: [phone]      Date Time: 09/27/20 4:54 PM  Patient Name: Emily [name]  Attending Physician: Mahlon Gammon, Belva Crome  Primary Care Physician: Skeet Simmer, MD    CC: left sided weakness and numbness      Assessment:     Active Hospital Problems    Diagnosis   . History of hypothyroidism   . History of breast cancer   . Left-sided weakness   . Mixed hyperlipidemia   . Hypertension           Plan:   -Admit to [facility] Hospitalist service   Left sided weakness, R/O acute stroke  - neuro check  - F/U MRi brain  - F/U neurology recmm  - F/U vit b12, vit D level  - F/U hemoglobin A1c  - cont' ASA, statin  - check orthostatic   - pt is fully vaccinated  Against covid 19     H/O HTN  - cont' amlodipine  - monitor BP and HR    H/O hypothyroidism  - cont' synthroid  - f/u TSH      Nutrition: cardiac diet   Code status:  Full code       Status/Disposition:   Pt is admitted under  observation    Anticipated medical stability for discharge: per medical improvement     History of Presenting Illnes   Emily [name] is a 74 y.o. female with PMH of HTN, dyslipidemia, hypothyroidism, breast cancer s/p Rt mastectomy presented for left sided weakness/numbness  . Of note she had multiple admission for the  same reason  With neg workup . She states about left upper and lower ext weakness and numbness when she was about to go to  bathroom. No slurred  Speech, no facial droop, no seizure. But Had mild dizziness. Work up in ER showed unremarkable Ct head ,neg covid 19 teast    Past Medical Histor     Past Medical History:   Diagnosis Date   . Breast cancer 2014    right breast mastectomy   . Exercise-induced angina     negative work up   . GERD (gastroesophageal reflux disease)    . Headache     resolved   . Hyperlipidemia    . Hypertension    . Hypothyroidism    . Low back pain    . Malignant  neoplasm of overlapping sites of right female breast 10/19/2015   . Syncope and collapse     Chronic Vertigo   . Vertigo          Available old records reviewed, including: EPIC     Past Surgical History:     Past Surgical History:   Procedure Laterality Date   . COLONOSCOPY, POLYPECTOMY N/A 07/21/2020    Procedure: COLONOSCOPY, POLYPECTOMY;  Surgeon: Einar Grad, MD;  Location: [facility];  Service: Gastroenterology;  Laterality: N/A;   . EGD, BIOPSY N/A 07/21/2020    Procedure: EGD, BIOPSY;  Surgeon: Einar Grad, MD;  Location: [facility];  Service: Gastroenterology;  Laterality: N/A;   . HYSTERECTOMY     . MASTECTOMY Right 2015       Family History:     Family History   Problem Relation Age of Onset   . Breast cancer Neg Hx        Social History:  reports that she has never smoked. She has never used smokeless tobacco. She reports current alcohol use of about 1.0 standard drink of alcohol per week. She reports that she does not use drugs.    Allergies:     Allergies   Allergen Reactions   . Morphine Itching   . Morphine And Related Itching and Nausea And Vomiting     dizzy       Medications:     Home Medications     Med List Status: In Progress Set By: Sherron Ales, RN at 09/27/2020  3:10 PM                amLODIPine (NORVASC) 10 MG tablet     Take 1 tablet (10 mg total) by mouth daily     anastrozole (ARIMIDEX) 1 MG tablet     Take 1 tablet (1 mg total) by mouth daily     Patient taking differently: Take 1 mg by mouth every morning         Apoaequorin (PREVAGEN PO)     Take by mouth daily     aspirin 81 MG chewable tablet     Chew 1 tablet (81 mg total) by mouth daily     desonide (DESOWEN) 0.05 % ointment     Apply topically 2 (two) times daily     ezetimibe (ZETIA) 10 MG tablet     Take 1 tablet (10 mg total) by mouth daily     Ferrous Sulfate (IRON PO)     Take by mouth     guaiFENesin (MUCINEX) 600 MG 12 hr tablet     Take 2 tablets (1,200 mg total) by mouth 2 (two) times daily      lactobacillus/streptococcus (RISAQUAD) Cap     Take 1 capsule by mouth daily     levothyroxine (SYNTHROID) 25 MCG tablet     Take 1 tablet (25 mcg total) by mouth every morning     lisinopril (ZESTRIL) 40 MG tablet     Take 1 tablet (40 mg total) by mouth daily     loperamide (IMODIUM) 2 MG capsule     Take 1 capsule (2 mg total) by mouth 4 (four) times daily as needed for Diarrhea     magnesium oxide (MAG-OX) 400 MG tablet     Take 1 tablet (400 mg total) by mouth daily     meclizine (ANTIVERT) 25 MG tablet     Take 1 tablet (25 mg total) by mouth 3 (three) times daily as needed for Dizziness     Multiple Vitamins-Minerals (MULTIVITAMIN WITH MINERALS) tablet     Take 1 tablet by mouth every morning         Naproxen Sodium (ALEVE PO)     Take by mouth as needed     pantoprazole (PROTONIX) 40 MG tablet     Take 1 tablet (40 mg total) by mouth every morning before breakfast     rosuvastatin (CRESTOR) 40 MG tablet     Take 0.5 tablets (20 mg total) by mouth daily     simvastatin (ZOCOR) 40 MG tablet     Take 40 mg by mouth nightly     vitamin B-1 (THIAMINE) 100 MG tablet     Take 100 mg by mouth every morning.          Method by which medications were confirmed on admission:       Review of Systems:   All other systems were reviewed  and are negative except:as above in HPI     Physical Exam:     Patient Vitals for the past 24 hrs:   BP Temp Temp src Pulse Resp SpO2 Weight   09/27/20 1640 174/78 98.6 F (37 C) Oral 65 18 100 % --   09/27/20 1525 176/80 -- -- 66 14 100 % --   09/27/20 1503 173/76 -- -- (!) 53 17 100 % --   09/27/20 1316 166/70 99.6 F (37.6 C) Oral 99 18 99 % 71.2 kg (156 lb 15.5 oz)     Body mass index is 29.66 kg/m.  No intake or output data in the 24 hours ending 09/27/20 1654    General: awake; no acute distress.  HEENT: perrla, eomi, sclera anicteric  oropharynx clear without lesions, mucous membranes moist  Neck: supple, no lymphadenopathy, no JVD, no carotid bruits  Cardiovascular: Normal S1 and  S2, no murmurs, rubs or gallops  Lungs: clear to auscultation bilaterally, without wheezing, rhonchi, or rales  Abdomen: soft, non-tender, non-distended; no palpable masses, no hepatosplenomegaly, normoactive bowel sounds, no rebound or guarding  Extremities: no clubbing, cyanosis, or edema  Neuro: alert, oriented x 3, cranial nerves grossly intact, strength 5/5 in upper and lower extremities, sensation intact,   Skin: no rashes or lesions noted        Labs:     Results     Procedure Component Value Units Date/Time    COVID-19 (SARS-COV-2) [facility] Rapid) [[id number]] Collected: 09/27/20 1514    Specimen: Nasopharyngeal Swab from Nasopharynx Updated: 09/27/20 1538     Purpose of COVID testing Screening     SARS-CoV-2 Specimen Source Nasopharyngeal     SARS CoV 2 Overall Result Negative    Narrative:      o Collect and clearly label specimen type:  o Upper respiratory specimen: One Nasopharyngeal Dry Swab NO  Transport Media.  o Hand deliver to laboratory ASAP  Indication for testing->Extended care facility admission to  semi private room  Screening    Troponin I [[id number]] Collected: 09/27/20 1347    Specimen: Blood Updated: 09/27/20 1424     Troponin I <0.01 ng/mL     PT/APTT [[id number]] Collected: 09/27/20 1347     Updated: 09/27/20 1421     PT 11.4 sec      PT INR 1.0     PTT 38 sec     GFR [[id number]] Collected: 09/27/20 1347     Updated: 09/27/20 1418     EGFR >60.0    Comprehensive metabolic panel [[id number]] Collected: 09/27/20 1347    Specimen: Blood Updated: 09/27/20 1418     Glucose 96 mg/dL      BUN 13 mg/dL      Creatinine 0.9 mg/dL      Sodium 244 mEq/L      Potassium 4.0 mEq/L      Chloride 107 mEq/L      CO2 29 mEq/L      Calcium 9.5 mg/dL      Protein, Total 7.3 g/dL      Albumin 4.1 g/dL      AST (SGOT) 19 U/L      ALT 15 U/L      Alkaline Phosphatase 97 U/L      Bilirubin, Total 0.4 mg/dL      Globulin 3.2 g/dL      Albumin/Globulin Ratio 1.3     Anion Gap 7.0    CBC and differential [[id number]]   (Abnormal) Collected: 09/27/20 1347  Specimen: Blood Updated: 09/27/20 1356     WBC 5.44 x10 3/uL      Hgb 12.5 g/dL      Hematocrit 62.6 %      Platelets 309 x10 3/uL      RBC 4.76 x10 6/uL      MCV 87.4 fL      MCH 26.3 pg      MCHC 30.0 g/dL      RDW 14 %      MPV 11.0 fL      Neutrophils 54.6 %      Lymphocytes Automated 34.2 %      Monocytes 8.1 %      Eosinophils Automated 2.0 %      Basophils Automated 0.9 %      Immature Granulocytes 0.2 %      Nucleated RBC 0.0 /100 WBC      Neutrophils Absolute 2.97 x10 3/uL      Lymphocytes Absolute Automated 1.86 x10 3/uL      Monocytes Absolute Automated 0.44 x10 3/uL      Eosinophils Absolute Automated 0.11 x10 3/uL      Basophils Absolute Automated 0.05 x10 3/uL      Immature Granulocytes Absolute 0.01 x10 3/uL      Absolute NRBC 0.00 x10 3/uL         EKG ; NSR  Imaging personally reviewed, including: all available   CT Head WO Contrast    Result Date: 09/27/2020   No acute process seen. Stable exam. Charlene Brooke, MD  09/27/2020 1:37 PM    Chest AP Portable    Result Date: 09/27/2020   No acute process seen. Stable exam Charlene Brooke, MD  09/27/2020 3:06 PM          Signed by: Yates Decamp, MD, MD   RS:[email or web address], Dema Severin, MD

## 2020-09-27 NOTE — EDIE (Signed)
COLLECTIVE?NOTIFICATION?09/27/2020 12:16?Emily [name], Emily [name]?MRN: [id number]    [place] [facility]'s patient encounter information:   LKG:?[id number]  Account [id number]  Billing Account [id number]      Criteria Met      5 ED Visits in 12 Months    Security and Safety  No recent Security Events currently on file    ED Care Guidelines  There are currently no ED Care Guidelines for this patient. Please check your facility's medical records system.    Flags      Negative COVID-19 Lab Result - [facility] - A specimen collected from this patient was negative for COVID-19 / Attributed By: [place] Department of [facility] / Attributed On: 09/01/2020       Prescription Monitoring Program  000??- Narcotic Use Score  000??- Sedative Use Score  000??- Stimulant Use Score  000??- Overdose Risk Score  - All Scores range from 000-999 with 75% of the population scoring < 200 and on 1% scoring above 650  - The last digit of the narcotic, sedative, and stimulant score indicates the number of active prescriptions of that type  - Higher Use scores correlate with increased prescribers, pharmacies, mg equiv, and overlapping prescriptions  - Higher Overdose Risk Scores correlate with increased risk of unintentional overdose death   Concerning or unexpectedly high scores should prompt a review of the PMP record; this does not constitute checking PMP for prescribing purposes.      E.D. Visit Count (12 mo.)  Facility Visits   [place] [facility] 9   Total 9   Note: Visits indicate total known visits.     Recent Emergency Department Visit Summary  Date Facility [facility] Type Diagnoses or Chief Complaint   Sep 27, 2020 [place] [facility] [place] [facility]      Body Pain      Aug 30, 2020 [place] [facility] [place] [facility]      headache,vomiting,back pain      Dizziness      Cough      Solitary pulmonary nodule      Pneumonia, unspecified organism      May 28, 2020 [place] [facility] [place]  [facility]      vaginal/rectal bleeding/dizziness      Headache      Tingling      Dizziness and giddiness      May 12, 2020 [place] [facility] [place] [facility]      Chest Pain,Tingling,Weak      Tingling      Headache      Chest Pain      Back Pain      Chest pain, unspecified      Mastodynia      Headache, unspecified      Weakness      Paresthesia of skin      May 10, 2020 [place] [facility] [place] [facility]      weakness. lump by lt ear      Fatigue      Chest Pain      Generalized weakness      Pruritus, unspecified      Other chest pain      Mar 10, 2020 [place] [facility]      abd pain      Generalized weakness      Abdominal Pain      Contact with and (  suspected) exposure to covid-19      Mar 08, 2020 [place] [facility] [place] [facility]      fatige,      fatige, weakness      Breast Pain      Other chest pain      Chest pain, unspecified      Other specified anxiety disorders      Acquired absence of right breast and nipple      Jan 27, 2020 [place] [facility] [place] [facility]      possible stroke      Numbness      Dec 28, 2019 [place] [facility] [place] [facility]      both breast pain      Chest Pain      Mastodynia      Secondary hypertension, unspecified          Recent Inpatient Visit Summary  Date Facility [facility] Type Diagnoses or Chief Complaint   Mar 10, 2020 [place] [facility]      Contact with and (suspected) exposure to covid-19      Noninfective gastroenteritis and colitis, unspecified      Jan 27, 2020 [place] [facility]      Weakness          Care Team  Provider Specialty Phone Fax Service Dates   DAVIDSON, Janet Berlin MD M, MD Family Medicine: Adult Medicine [phone] Current      Collective Portal  This patient has registered at the [facility] Emergency Department   For more information visit:  [email or web address]     PLEASE NOTE:     1.   Any care recommendations and other clinical information are provided as guidelines or for historical purposes only, and providers should exercise their own clinical judgment when providing care.    2.   You may only use this information for purposes of treatment, payment or health care operations activities, and subject to the limitations of applicable Collective Policies.    3.   You should consult directly with the organization that provided a care guideline or other clinical history with any questions about additional information or accuracy or completeness of information provided.    ? 2021 [place], [employer] - [email or web address]

## 2020-09-27 NOTE — ED Provider Notes (Signed)
EMERGENCY DEPARTMENT NOTE     Patient initially seen and examined at   ED PHYSICIAN ASSIGNED     Date/Time Event User Comments    09/27/20 1320 Physician Assigned [name], DO assigned as Attending    09/27/20 1548 Physician Assigned Minna Antis, MD assigned as Attending         ED MIDLEVEL (APP) ASSIGNED     None          HISTORY OF PRESENT ILLNESS   Historian:Patient  Translator Used: No    Chief Complaint: Extremity Weakness       74 y.o. female with hx of htn, hld presents to the ed with arm and leg weakness that started last night. States last night she felt tingling of her arms and legs. States this am her left arm and leg felt weak.  pt drove herself to the ed. Denies ha, cp or sob.  Denies nausea or vomiting. Pt does c/o neck pain and left shoulder, low back and left hip pain.    1. Location of symptoms: left sided weakness  2. Onset of symptoms: last night  3. What was patient doing when symptoms started (Context): see above  4. Severity: moderate  5. Timing: gradual onset   6. Activities that worsen symptoms: nothing   7. Activities that improve symptoms: nothing   8. Quality: tingling, left sided weakness, neck pain, left shoulder and hip pain  9. Radiation of symptoms: to lue and lle  10. Associated signs and Symptoms: see above  11. Are symptoms worsening? yes  MEDICAL HISTORY     Past Medical History:  Past Medical History:   Diagnosis Date   . Breast cancer 2014    right breast mastectomy   . Exercise-induced angina     negative work up   . GERD (gastroesophageal reflux disease)    . Headache     resolved   . Hyperlipidemia    . Hypertension    . Hypothyroidism    . Low back pain    . Malignant neoplasm of overlapping sites of right female breast 10/19/2015   . Syncope and collapse     Chronic Vertigo   . Vertigo        Past Surgical History:  Past Surgical History:   Procedure Laterality Date   . COLONOSCOPY, POLYPECTOMY N/A 07/21/2020    Procedure:  COLONOSCOPY, POLYPECTOMY;  Surgeon: Einar Grad, MD;  Location: [facility];  Service: Gastroenterology;  Laterality: N/A;   . EGD, BIOPSY N/A 07/21/2020    Procedure: EGD, BIOPSY;  Surgeon: Einar Grad, MD;  Location: [facility];  Service: Gastroenterology;  Laterality: N/A;   . HYSTERECTOMY     . MASTECTOMY Right 2015       Social History:  Social History     Socioeconomic History   . Marital status: Widowed   Tobacco Use   . Smoking status: Never Smoker   . Smokeless tobacco: Never Used   Vaping Use   . Vaping Use: Never used   Substance and Sexual Activity   . Alcohol use: Yes     Alcohol/week: 1.0 standard drink     Types: 1 Cans of beer per week     Comment: socially   . Drug use: No   . Sexual activity: Not Currently       Family History:  Family History   Problem Relation Age of Onset   . Breast cancer  Neg Hx        Outpatient Medication:  Current Discharge Medication List      CONTINUE these medications which have NOT CHANGED    Details   amLODIPine (NORVASC) 10 MG tablet Take 1 tablet (10 mg total) by mouth daily  Qty: 30 tablet, Refills: 0      anastrozole (ARIMIDEX) 1 MG tablet Take 1 tablet (1 mg total) by mouth daily  Qty: 90 tablet, Refills: 3    Associated Diagnoses: Malignant neoplasm of overlapping sites of right breast in female, estrogen receptor positive      Apoaequorin (PREVAGEN PO) Take by mouth daily      aspirin 81 MG chewable tablet Chew 1 tablet (81 mg total) by mouth daily  Qty: 90 tablet, Refills: 0      desonide (DESOWEN) 0.05 % ointment Apply topically 2 (two) times daily  Qty: 15 g, Refills: 0      ezetimibe (ZETIA) 10 MG tablet Take 1 tablet (10 mg total) by mouth daily  Qty: 90 tablet, Refills: 3    Associated Diagnoses: Pure hypercholesterolemia      guaiFENesin (MUCINEX) 600 MG 12 hr tablet Take 2 tablets (1,200 mg total) by mouth 2 (two) times daily  Qty: 14 tablet, Refills: 0      levothyroxine (SYNTHROID) 25 MCG tablet Take 1 tablet (25 mcg total) by mouth every morning  Qty:  30 tablet, Refills: 0      lisinopril (ZESTRIL) 40 MG tablet Take 1 tablet (40 mg total) by mouth daily  Qty: 90 tablet, Refills: 3    Associated Diagnoses: Essential hypertension; Dyspepsia      loperamide (IMODIUM) 2 MG capsule Take 1 capsule (2 mg total) by mouth 4 (four) times daily as needed for Diarrhea  Qty: 10 capsule, Refills: 0      magnesium oxide (MAG-OX) 400 MG tablet Take 1 tablet (400 mg total) by mouth daily  Qty: 30 tablet, Refills: 0      Multiple Vitamins-Minerals (MULTIVITAMIN WITH MINERALS) tablet Take 1 tablet by mouth every morning          Naproxen Sodium (ALEVE PO) Take by mouth as needed      pantoprazole (PROTONIX) 40 MG tablet Take 1 tablet (40 mg total) by mouth every morning before breakfast  Qty: 30 tablet, Refills: 0      rosuvastatin (CRESTOR) 40 MG tablet Take 0.5 tablets (20 mg total) by mouth daily  Qty: 90 tablet, Refills: 3    Associated Diagnoses: Essential hypertension; Mixed hyperlipidemia      simvastatin (ZOCOR) 40 MG tablet Take 40 mg by mouth nightly      vitamin B-1 (THIAMINE) 100 MG tablet Take 100 mg by mouth every morning.      Ferrous Sulfate (IRON PO) Take by mouth      lactobacillus/streptococcus (RISAQUAD) Cap Take 1 capsule by mouth daily  Qty: 10 capsule, Refills: 0      meclizine (ANTIVERT) 25 MG tablet Take 1 tablet (25 mg total) by mouth 3 (three) times daily as needed for Dizziness  Qty: 10 tablet, Refills: 0               REVIEW OF SYSTEMS   Review of Systems   Respiratory: Negative for shortness of breath.    Cardiovascular: Negative for chest pain.   Neurological: Positive for weakness.   All other systems reviewed and are negative.           PHYSICAL EXAM  ED Triage Vitals [09/27/20 1316]   Enc Vitals Group      BP 166/70      Heart Rate 99      Resp Rate 18      Temp 99.6 F (37.6 C)      Temp Source Oral      SpO2 99 %      Weight 71.2 kg      Height       Head Circumference       Peak Flow       Pain Score       Pain Loc       Pain Edu?        Excl. in GC?        Nursing note and vitals reviewed.  Constitutional:  Well developed, well nourished. Awake & Oriented x3.  Head:  Atraumatic. Normocephalic.    Eyes:  PERRL. EOMI. Conjunctivae are not pale.  ENT:  Mucous membranes are moist and intact. Oropharynx is clear and symmetric.  Patent airway.  Neck:  Supple. Full ROM.  + tenderness to palpation cervical spine  Cardiovascular:  Regular rate. Regular rhythm. No murmurs, rubs, or gallops.  Pulmonary/Chest:  No evidence of respiratory distress. Clear to auscultation bilaterally.  No wheezing, rales or rhonchi.   Abdominal:  Soft and non-distended. There is no tenderness. No rebound, guarding, or rigidity.  Back:  Full ROM. + tenderness to palpation lower lumbar spine.   Extremities:  No edema. No cyanosis. No clubbing. Full range of motion in all extremities. + tenderness to palpation left shoulder. Pain with rom left shoulder.   Skin:  Skin is warm and dry.  No diaphoresis. No rash.   Neurological:  Alert and oriented to person, place, and time.  PERRL.  EOMI.  No visual field deficit. Cranial nerves II-XII are intact.  Strength is equal but  weak in the upper and lower extremities bilaterally. pt states left side feels weaker. No sensory deficits to light touch.  No pronator drift on my exam.  Normal speech.  Psychiatric:  Good eye contact. Normal interaction, affect, and behavior.        MEDICAL DECISION MAKING   Pt given IV fluids for hydration  CXR to r/o pneumonia  Troponin to r/o ischemia    Ct head to r/o sdh    Nihss = 2    Admit to r/o stroke    Case discussed with DR Rich Fuchs - IMG On call who agrees to admit  Case discussed with Dr Always- neurology on call who agrees to be on consult    Results and plan discussed with pt who agrees with admission       Differential diagnosis includes tia, stroke, migraine, cervical radiculopathy, lumbar radiculopathy, demyelinating disease.       DISCUSSION        NIH Stroke Score      Most Recent Value    Patient's calculated Stroke Score: 2 filed at 09/27/2020 2020            All labs and vital signs from the current visit have been reviewed and any abnormality that is present is not due to severe sepsis or septic shock.    Vital Signs: Reviewed the patient's vital signs.   Nursing Notes: Reviewed and utilized available nursing notes.  Medical Records Reviewed: Reviewed available past medical records.  Counseling: The emergency provider has spoken with the patient and discussed today's findings, in addition to providing specific details  for the plan of care.  Questions are answered and there is agreement with the plan.      MIPS DOCUMENTATION        CARDIAC STUDIES    The following cardiac studies were independently interpreted by the Emergency Medicine Physician.  For full cardiac study results please see chart.    Monitor Strip  Interpreted by ED Physician  Rate: 66  Rhythm: NSR   ST Changes: none    EKG Interpretation:  Signed and interpreted byED Physician   Time Interpreted: 1341  Comparison: 08/30/20  Rate: 70  Rhythm: NSR  Axis: normal  Intervals: normal  Blocks: none  ST segments: nml  Interpretation: Nonspecific  EKG    EMERGENCY IMAGING STUDIES    The following imagine studies were independently interpreted by me (emergency physician):    Radiology:  Interpreted by me (ED Physician)  Study: Chest Xray   Results: No infiltrate. No pneumothorax. No hemothorax. No cardiomegaly. No CHF.  Impression: No acute intrathoracic abnormality.         RADIOLOGY IMAGING STUDIES      MRI Brain W WO Contrast   Final Result          SMALL VESSEL DISEASE. NO ACUTE ABNORMALITY. NO FOCAL ENHANCING LESION.      Darnelle Maffucci, MD    09/27/2020 7:10 PM      MR Angiogram Head WO Contrast   Final Result    NORMAL MRA OF THE HEAD.      Darnelle Maffucci, MD    09/27/2020 7:02 PM      CT Cervical Spine WO Contrast   Final Result       1. Cervical and thoracic spondylosis unchanged compared to prior CT   angiogram. The canal and neural  foramen are narrowed at multiple levels.   No acute fracture is seen.   2. Enlarged and inhomogeneous thyroid with a right-sided thyroid lesion   unchanged compared to prior CT angiogram.      Charlene Brooke, MD    09/27/2020 5:09 PM      Chest AP Portable   Final Result    No acute process seen. Stable exam      Charlene Brooke, MD    09/27/2020 3:06 PM      CT Head WO Contrast   Final Result    No acute process seen. Stable exam.      Charlene Brooke, MD    09/27/2020 1:37 PM              PULSE OXIMETRY    Oxygen Saturation by Pulse Oximetry: 99%  Interventions: none  Interpretation:  nml    EMERGENCY DEPT. MEDICATIONS      ED Medication Orders (From admission, onward)    Start Ordered     Status Ordering Provider    09/27/20 2100 09/27/20 1718  heparin (porcine) injection 5,000 Units  Every 12 hours scheduled        Route: Subcutaneous  Ordered Dose: 5,000 Units     Last MAR action: Given [facility], MAJID    09/27/20 1859 09/27/20 1859  gadoterate meglumine (CLARISCAN) 7.5 MMOL/15ML injection 14 mL  IMG once as needed        Route: Intravenous  Ordered Dose: 0.2 mL/kg     Last [facility] action: Imaging Agent Given [facility], MAJID    09/27/20 1717 09/27/20 1718  acetaminophen (TYLENOL) tablet 650 mg  Every 6 hours PRN        Route:  Oral  Ordered Dose: 650 mg    "Or" Linked Group Details    Welton Flakes, MAJID    09/27/20 1717 09/27/20 1718  acetaminophen (TYLENOL) suppository 650 mg  Every 6 hours PRN        Route: Rectal  Ordered Dose: 650 mg    "Or" Linked Group Details    Acknowledged [facility], MAJID    09/27/20 1717 09/27/20 1718  ondansetron (ZOFRAN-ODT) disintegrating tablet 4 mg  Every 6 hours PRN        Route: Oral  Ordered Dose: 4 mg    "Or" Linked Group Details    Acknowledged [facility], MAJID    09/27/20 1717 09/27/20 1718  ondansetron (ZOFRAN) injection 4 mg  Every 6 hours PRN        Route: Intravenous  Ordered Dose: 4 mg    "Or" Linked Group Details     Acknowledged [facility], MAJID    09/27/20 1717 09/27/20 1718  dextrose 50 % bolus 12.5 g  As needed        Route: Intravenous  Ordered Dose: 12.5 g    "And" Linked Group Details    Acknowledged [facility], MAJID    09/27/20 1717 09/27/20 1718  glucagon (rDNA) (GLUCAGEN) injection 1 mg  As needed        Route: Intramuscular  Ordered Dose: 1 mg    "And" Linked Group Details    Acknowledged [facility], MAJID    09/27/20 1717 09/27/20 1718  naloxone (NARCAN) injection 0.2 mg  As needed        Route: Intravenous  Ordered Dose: 0.2 mg     Acknowledged [facility], MAJID    09/27/20 1717 09/27/20 1718  melatonin tablet 3 mg  At bedtime PRN        Route: Oral  Ordered Dose: 3 mg     Acknowledged [facility], MAJID    09/27/20 1717 09/27/20 1718  dextrose (GLUCOSE) 40 % oral gel 15 g of glucose  As needed        Route: Oral  Ordered Dose: 15 g of glucose    "And" Linked Group Details    Acknowledged [facility], MAJID    09/27/20 1609 09/27/20 1557  aspirin suppository 300 mg  Once in ED        Route: Rectal  Ordered Dose: 300 mg     Last MAR action: Given Loetta Rough    09/27/20 1556 09/27/20 1555    Daily        Route: Oral  Ordered Dose: 325 mg     Discontinued Domnic Vantol G          LABORATORY RESULTS    Ordered and independently interpreted AVAILABLE laboratory tests. Please see results section in chart for full details.  Results for orders placed or performed during the hospital encounter of 09/27/20   COVID-19 (SARS-COV-2) ([facility] [facility])    Specimen: Nasopharynx; Nasopharyngeal Swab   Result Value Ref Range    Purpose of COVID testing Screening     SARS-CoV-2 Specimen Source Nasopharyngeal     SARS CoV 2 Overall Result Negative    Comprehensive metabolic panel   Result Value Ref Range    Glucose 96 70 - 100 mg/dL    BUN 13 7 - 19 mg/dL    Creatinine 0.9 0.6 - 1.0 mg/dL    Sodium 960 454 - 098 mEq/L    Potassium 4.0 3.5 - 5.1 mEq/L    Chloride  107 100 - 111 mEq/L    CO2 29 22 - 29  mEq/L    Calcium 9.5 7.9 - 10.2 mg/dL    Protein, Total 7.3 6.0 - 8.3 g/dL    Albumin 4.1 3.5 - 5.0 g/dL    AST (SGOT) 19 5 - 34 U/L    ALT 15 0 - 55 U/L    Alkaline Phosphatase 97 37 - 106 U/L    Bilirubin, Total 0.4 0.2 - 1.2 mg/dL    Globulin 3.2 2.0 - 3.6 g/dL    Albumin/Globulin Ratio 1.3 0.9 - 2.2    Anion Gap 7.0 5.0 - 15.0   CBC and differential   Result Value Ref Range    WBC 5.44 3.10 - 9.50 x10 3/uL    Hgb 12.5 11.4 - 14.8 g/dL    Hematocrit 16.1 09.6 - 43.7 %    Platelets 309 142 - 346 x10 3/uL    RBC 4.76 3.90 - 5.10 x10 6/uL    MCV 87.4 78.0 - 96.0 fL    MCH 26.3 25.1 - 33.5 pg    MCHC 30.0 (L) 31.5 - 35.8 g/dL    RDW 14 11 - 15 %    MPV 11.0 8.9 - 12.5 fL    Neutrophils 54.6 None %    Lymphocytes Automated 34.2 None %    Monocytes 8.1 None %    Eosinophils Automated 2.0 None %    Basophils Automated 0.9 None %    Immature Granulocytes 0.2 None %    Nucleated RBC 0.0 0.0 - 0.0 /100 WBC    Neutrophils Absolute 2.97 1.10 - 6.33 x10 3/uL    Lymphocytes Absolute Automated 1.86 0.42 - 3.22 x10 3/uL    Monocytes Absolute Automated 0.44 0.21 - 0.85 x10 3/uL    Eosinophils Absolute Automated 0.11 0.00 - 0.44 x10 3/uL    Basophils Absolute Automated 0.05 0.00 - 0.08 x10 3/uL    Immature Granulocytes Absolute 0.01 0.00 - 0.07 x10 3/uL    Absolute NRBC 0.00 0.00 - 0.00 x10 3/uL   Troponin I   Result Value Ref Range    Troponin I <0.01 0.00 - 0.05 ng/mL   PT/APTT   Result Value Ref Range    PT 11.4 10.1 - 12.9 sec    PT INR 1.0 0.9 - 1.1    PTT 38 27 - 39 sec   GFR   Result Value Ref Range    EGFR >60.0    CBC and differential   Result Value Ref Range    WBC 5.56 3.10 - 9.50 x10 3/uL    Hgb 11.4 11.4 - 14.8 g/dL    Hematocrit 04.5 40.9 - 43.7 %    Platelets 300 142 - 346 x10 3/uL    RBC 4.38 3.90 - 5.10 x10 6/uL    MCV 87.0 78.0 - 96.0 fL    MCH 26.0 25.1 - 33.5 pg    MCHC 29.9 (L) 31.5 - 35.8 g/dL    RDW 14 11 - 15 %    MPV 11.7 8.9 - 12.5 fL    Neutrophils 47.0 None %    Lymphocytes Automated 40.5 None %     Monocytes 8.5 None %    Eosinophils Automated 2.9 None %    Basophils Automated 0.7 None %    Immature Granulocytes 0.4 None %    Nucleated RBC 0.0 0.0 - 0.0 /100 WBC    Neutrophils Absolute 2.62 1.10 - 6.33 x10 3/uL  Lymphocytes Absolute Automated 2.25 0.42 - 3.22 x10 3/uL    Monocytes Absolute Automated 0.47 0.21 - 0.85 x10 3/uL    Eosinophils Absolute Automated 0.16 0.00 - 0.44 x10 3/uL    Basophils Absolute Automated 0.04 0.00 - 0.08 x10 3/uL    Immature Granulocytes Absolute 0.02 0.00 - 0.07 x10 3/uL    Absolute NRBC 0.00 0.00 - 0.00 x10 3/uL   ECG 12 lead   Result Value Ref Range    Ventricular Rate 70 BPM    Atrial Rate 70 BPM    P-R Interval 180 ms    QRS Duration 88 ms    Q-T Interval 390 ms    QTC Calculation (Bezet) 421 ms    P Axis 49 degrees    R Axis 23 degrees    T Axis 19 degrees       CRITICAL CARE/PROCEDURES    Critical Care  Performed by: Loetta Rough, DO  Authorized by: Loetta Rough, DO     Critical care provider statement:     Critical care time (minutes):  35    Critical care start time:  09/27/2020 1:20 PM    Critical care time was exclusive of:  Separately billable procedures and treating other patients    Critical care was necessary to treat or prevent imminent or life-threatening deterioration of the following conditions:  CNS failure or compromise    Critical care was time spent personally by me on the following activities:  Ordering and performing treatments and interventions, ordering and review of laboratory studies, ordering and review of radiographic studies, pulse oximetry, re-evaluation of patient's condition, review of old charts, obtaining history from patient or surrogate, examination of patient, evaluation of patient's response to treatment, discussions with primary provider, discussions with consultants and development of treatment plan with patient or surrogate    I assumed direction of critical care for this patient from another provider in my specialty: no              DIAGNOSIS      Diagnosis:  Final diagnoses:   Left-sided weakness   COVID-19 ruled out by laboratory testing       Disposition:  ED Disposition     ED Disposition Condition Date/Time Comment    Observation  Sun Sep 27, 2020  3:48 PM Admitting Physician: Mahlon Gammon, MAJID [phone]   Service:: Medicine [106]   Estimated Length of Stay: < 2 midnights   Tentative Discharge Plan?: Home or Self Care [1]   Does patient need telemetry?: Yes   Telemetry type (separate Telemetry order is also required):: Medical telemetry            Prescriptions:  Current Discharge Medication List      CONTINUE these medications which have NOT CHANGED    Details   amLODIPine (NORVASC) 10 MG tablet Take 1 tablet (10 mg total) by mouth daily  Qty: 30 tablet, Refills: 0      anastrozole (ARIMIDEX) 1 MG tablet Take 1 tablet (1 mg total) by mouth daily  Qty: 90 tablet, Refills: 3    Associated Diagnoses: Malignant neoplasm of overlapping sites of right breast in female, estrogen receptor positive      Apoaequorin (PREVAGEN PO) Take by mouth daily      aspirin 81 MG chewable tablet Chew 1 tablet (81 mg total) by mouth daily  Qty: 90 tablet, Refills: 0      desonide (DESOWEN) 0.05 % ointment Apply topically 2 (two) times daily  Qty: 15 g, Refills: 0      ezetimibe (ZETIA) 10 MG tablet Take 1 tablet (10 mg total) by mouth daily  Qty: 90 tablet, Refills: 3    Associated Diagnoses: Pure hypercholesterolemia      guaiFENesin (MUCINEX) 600 MG 12 hr tablet Take 2 tablets (1,200 mg total) by mouth 2 (two) times daily  Qty: 14 tablet, Refills: 0      levothyroxine (SYNTHROID) 25 MCG tablet Take 1 tablet (25 mcg total) by mouth every morning  Qty: 30 tablet, Refills: 0      lisinopril (ZESTRIL) 40 MG tablet Take 1 tablet (40 mg total) by mouth daily  Qty: 90 tablet, Refills: 3    Associated Diagnoses: Essential hypertension; Dyspepsia      loperamide (IMODIUM) 2 MG capsule Take 1 capsule (2 mg total) by mouth 4 (four) times daily as needed for  Diarrhea  Qty: 10 capsule, Refills: 0      magnesium oxide (MAG-OX) 400 MG tablet Take 1 tablet (400 mg total) by mouth daily  Qty: 30 tablet, Refills: 0      Multiple Vitamins-Minerals (MULTIVITAMIN WITH MINERALS) tablet Take 1 tablet by mouth every morning          Naproxen Sodium (ALEVE PO) Take by mouth as needed      pantoprazole (PROTONIX) 40 MG tablet Take 1 tablet (40 mg total) by mouth every morning before breakfast  Qty: 30 tablet, Refills: 0      rosuvastatin (CRESTOR) 40 MG tablet Take 0.5 tablets (20 mg total) by mouth daily  Qty: 90 tablet, Refills: 3    Associated Diagnoses: Essential hypertension; Mixed hyperlipidemia      simvastatin (ZOCOR) 40 MG tablet Take 40 mg by mouth nightly      vitamin B-1 (THIAMINE) 100 MG tablet Take 100 mg by mouth every morning.      Ferrous Sulfate (IRON PO) Take by mouth      lactobacillus/streptococcus (RISAQUAD) Cap Take 1 capsule by mouth daily  Qty: 10 capsule, Refills: 0      meclizine (ANTIVERT) 25 MG tablet Take 1 tablet (25 mg total) by mouth 3 (three) times daily as needed for Dizziness  Qty: 10 tablet, Refills: 0              Loetta Rough, DO  09/28/20 0960

## 2020-09-27 NOTE — ED Notes (Signed)
[facility] EM[facility]PART[facility]  ED NURSING NOTE FOR THE RECEIVING INPATIENT NURSE   ED NURSE Deborra Medina [phone]   ED CHARGE RN Dorene Grebe   ADMISSION INFORMATION   Emily [name] is a 74 y.o. female admitted with an ED diagnosis of:    1. Left-sided weakness    2. COVID-19 ruled out by laboratory testing         Isolation: None   Allergies: Morphine and Morphine and related   Holding Orders confirmed? Yes   Belongings Documented? Yes   Home medications sent to pharmacy confirmed? No   NURSING CARE   Patient Comes From:   Mental Status: Home Independent  alert and oriented   ADL: Needs assistance with ADLs   Ambulation: mild difficulty   Pertinent Information  and Safety Concerns: Neuro checks, Fall risk     CT / NIH   CT Head ordered on this patient?  Yes   NIH/Dysphagia assessment done prior to admission? Yes   VITAL SIGNS (at the time of this note)      Vitals:    09/27/20 1640   BP: 174/78   Pulse: 65   Resp: 18   Temp: 98.6 F (37 C)   SpO2: 100%

## 2020-09-28 LAB — BASIC METABOLIC PANEL
Anion Gap: 14 (ref 5.0–15.0)
BUN: 10 mg/dL (ref 7–19)
CO2: 23 mEq/L (ref 22–29)
Calcium: 9 mg/dL (ref 7.9–10.2)
Chloride: 104 mEq/L (ref 100–111)
Creatinine: 0.8 mg/dL (ref 0.6–1.0)
Glucose: 87 mg/dL (ref 70–100)
Potassium: 4.1 mEq/L (ref 3.5–5.1)
Sodium: 141 mEq/L (ref 136–145)

## 2020-09-28 LAB — CBC AND DIFFERENTIAL
Absolute NRBC: 0 10*3/uL (ref 0.00–0.00)
Basophils Absolute Automated: 0.04 10*3/uL (ref 0.00–0.08)
Basophils Automated: 0.7 %
Eosinophils Absolute Automated: 0.16 10*3/uL (ref 0.00–0.44)
Eosinophils Automated: 2.9 %
Hematocrit: 38.1 % (ref 34.7–43.7)
Hgb: 11.4 g/dL (ref 11.4–14.8)
Immature Granulocytes Absolute: 0.02 10*3/uL (ref 0.00–0.07)
Immature Granulocytes: 0.4 %
Lymphocytes Absolute Automated: 2.25 10*3/uL (ref 0.42–3.22)
Lymphocytes Automated: 40.5 %
MCH: 26 pg (ref 25.1–33.5)
MCHC: 29.9 g/dL — ABNORMAL LOW (ref 31.5–35.8)
MCV: 87 fL (ref 78.0–96.0)
MPV: 11.7 fL (ref 8.9–12.5)
Monocytes Absolute Automated: 0.47 10*3/uL (ref 0.21–0.85)
Monocytes: 8.5 %
Neutrophils Absolute: 2.62 10*3/uL (ref 1.10–6.33)
Neutrophils: 47 %
Nucleated RBC: 0 /100 WBC (ref 0.0–0.0)
Platelets: 300 10*3/uL (ref 142–346)
RBC: 4.38 10*6/uL (ref 3.90–5.10)
RDW: 14 % (ref 11–15)
WBC: 5.56 10*3/uL (ref 3.10–9.50)

## 2020-09-28 LAB — VITAMIN D,25 OH,TOTAL: Vitamin D, 25 OH, Total: 68 ng/mL (ref 30–100)

## 2020-09-28 LAB — LIPID PANEL
Cholesterol / HDL Ratio: 3.5
Cholesterol: 216 mg/dL — ABNORMAL HIGH (ref 0–199)
HDL: 61 mg/dL (ref 40–9999)
LDL Calculated: 136 mg/dL — ABNORMAL HIGH (ref 0–99)
Triglycerides: 93 mg/dL (ref 34–149)
VLDL Calculated: 19 mg/dL (ref 10–40)

## 2020-09-28 LAB — VITAMIN B12: Vitamin B-12: 1363 pg/mL — ABNORMAL HIGH (ref 211–911)

## 2020-09-28 LAB — HEMOGLOBIN A1C
Average Estimated Glucose: 108.3 mg/dL
Hemoglobin A1C: 5.4 % (ref 4.6–5.9)

## 2020-09-28 LAB — HEMOLYSIS INDEX: Hemolysis Index: 7 (ref 0–24)

## 2020-09-28 LAB — TSH: TSH: 0.75 u[IU]/mL (ref 0.35–4.94)

## 2020-09-28 LAB — GFR: EGFR: >60

## 2020-09-28 MED ORDER — AMLODIPINE BESYLATE 10 MG PO TABS
10.0000 mg | ORAL_TABLET | Freq: Every day | ORAL | 0 refills | Status: DC
Start: 2020-09-29 — End: 2021-04-02

## 2020-09-28 MED ORDER — ROSUVASTATIN CALCIUM 40 MG PO TABS
20.0000 mg | ORAL_TABLET | Freq: Every day | ORAL | 3 refills | Status: DC
Start: 2020-09-28 — End: 2022-01-16

## 2020-09-28 MED ORDER — LISINOPRIL 40 MG PO TABS
40.0000 mg | ORAL_TABLET | Freq: Every day | ORAL | 0 refills | Status: DC
Start: 2020-09-29 — End: 2021-04-02

## 2020-09-28 NOTE — UM Notes (Signed)
.  Mallie Darting RN, BSN @ [phone].       [birth year]    74 y.o. female with PMH of HTN, dyslipidemia, hypothyroidism, breast cancer s/p Rt mastectomy presented for left sided weakness/numbness  . Of note she had multiple admission for the  same reason  With neg workup        BP Temp Temp src Pulse Resp SpO2 Weight   09/27/20 1640 174/78 98.6 F (37 C) Oral 65 18 100 % -     Neuro: alert, oriented x 3, cranial nerves grossly intact, strength 5/5 in upper and lower extremities, sensation intact,         LABS WNL      CT HEAD (-)      MRA (-)        CT CERVICAL SPINE  . Cervical and thoracic spondylosis unchanged compared to prior CT   angiogram. The canal and neural foramen are narrowed at multiple levels.   No acute fracture is seen.   2. Enlarged and inhomogeneous thyroid with a right-sided thyroid lesion   unchanged compared to prior CT angiogram.       Medication Administration from 09/27/2020 1216 to 09/27/2020 1927     Date/Time Order Dose Route Action Action by Comments    09/27/2020 1556 aspirin tablet 325 mg  Oral Canceled Entry Loetta Rough, DO Automatically canceled at discontinue of medication order    09/27/2020 1645 aspirin suppository 300 mg 300 mg Rectal Given DemskoLaurina Bustle, RN      Admit to Observation (Order [id number])  Admission  Date: 09/27/2020 Department: [facility] Intermediate Care Ordering/Authorizing: Loetta Rough, DO     Plan:   -Admit to [facility] Hospitalist service   Left sided weakness, R/O acute stroke  - neuro check  - F/U MRi brain  - F/U neurology recmm  - F/U vit b12, vit D level  - F/U hemoglobin A1c  - cont' ASA, statin  - check orthostatic   - pt is fully vaccinated  Against covid 19     H/O HTN  - cont' amlodipine  - monitor BP and HR    H/O hypothyroidism  - cont' synthroid  - f/u TSH      Nutrition: cardiac diet   Code status:  Full code       Status/Disposition:   Pt is admitted under  observation

## 2020-09-28 NOTE — OT Eval Note (Addendum)
[facility]  [address]  [place], [place] [address]  [phone]    Occupational Therapy Evaluation    Patient: Emily [name] MRN: [id number]   Unit: [facility] INTERMEDIATE CARE Bed: MI622/MI622-02    Time of treatment: Time Calculation  OT Received On: 09/28/20  Start Time: 1138  Stop Time: 1149  Time Calculation (min): 11 min    Consult received for Emily [name] for OT evaluation and treatment.  Patient's medical condition is appropriate for Occupational Therapy  intervention at this time.    Interpreter utilized: no, not indicated    D/C Suggestions   Based on today's performance, recommended discharge is to Home.  Equipment recommended for discharge: No equipment needs    Transport Recommendations: no limitations    Discharge recommendations are based on patient's progression/regression. Please see most recent note for updated discharge recommendations.    Assessment     Emily [name] is a 74 y.o. female admitted 09/27/2020.  With c/o L side weakness and numbness. hx of htn, hld presents to the ed with arm and leg weakness that started last night. States last night she felt tingling of her arms and legs. States this am her left arm and leg felt weak.  pt drove herself to the ed. Denies ha, cp or sob.  Denies nausea or vomiting. Pt does c/o neck pain and left shoulder, low back and left hip pain.  At baseline pt lives alone in an [place], is Indep in ADL"s and ambulation.   Today pt received sitting EOB, reported all symptoms have been resolved. Pt is in Indpe in bed mob, Indep LB dressing, Indep feeding. Pt with no acute OT needs. Recommend d/c Home.        Brief chart review completed including review of labs, review of imaging and review of vitals.     Complexity Chart Review Performance Deficits Clinical Decision Making Hx/Comorbidities Assistance needed   Low  Brief 1-3 Limited options None None (or at baseline)       PMP - Progressive Mobility Protocol   PMP Activity: Step 4 - Dangle at  Bedside  Distance Walked (ft) (Step 6,7): 0 Feet       Interdisciplinary Communication: discussed with PT/RN    Plan     OT Plan  Risks/Benefits/POC Discussed with Pt/Family: With patient  Treatment Interventions: No skilled interventions needed at this time  Discharge Recommendation: Home with no needs  DME Recommended for Discharge: No additional equipment/DME recommended at this time  OT Frequency Recommended: therapy discontinued         Medical Diagnosis: Left-sided weakness [R53.1]  COVID-19 ruled out by laboratory testing [Z20.822]    History of Present Illness: Emily [name] is a 74 y.o. female admitted on  09/27/2020 with "hx of htn, hld presents to the ed with arm and leg weakness that started last night. States last night she felt tingling of her arms and legs. States this am her left arm and leg felt weak.  pt drove herself to the ed. Denies ha, cp or sob.  Denies nausea or vomiting. Pt does c/o neck pain and left shoulder, low back and left hip pain"        Patient Active Problem List   Diagnosis   . Malignant neoplasm of overlapping sites of right female breast   . Hypertension   . Mixed hyperlipidemia   . Hypothyroid   . Right sided numbness   . Chest discomfort   .  SOB (shortness of breath)   . Left sided numbness   . Left-sided weakness   . COVID-19 ruled out by laboratory testing   . Acute colitis   . Dizziness   . Right lower quadrant abdominal pain   . Abnormal CT scan, gastrointestinal tract   . History of hypothyroidism   . History of breast cancer       Past Medical History:   Diagnosis Date   . Breast cancer 2014    right breast mastectomy   . Exercise-induced angina     negative work up   . GERD (gastroesophageal reflux disease)    . Headache     resolved   . Hyperlipidemia    . Hypertension    . Hypothyroidism    . Low back pain    . Malignant neoplasm of overlapping sites of right female breast 10/19/2015   . Syncope and collapse     Chronic Vertigo   . Vertigo        Past Surgical History:    Procedure Laterality Date   . COLONOSCOPY, POLYPECTOMY N/A 07/21/2020    Procedure: COLONOSCOPY, POLYPECTOMY;  Surgeon: Einar Grad, MD;  Location: [facility];  Service: Gastroenterology;  Laterality: N/A;   . EGD, BIOPSY N/A 07/21/2020    Procedure: EGD, BIOPSY;  Surgeon: Einar Grad, MD;  Location: [facility];  Service: Gastroenterology;  Laterality: N/A;   . HYSTERECTOMY     . MASTECTOMY Right 2015         X-Rays/Tests/Labs:  Lab Results   Component Value Date/Time    HGB 11.4 09/28/2020 05:30 AM    HCT 38.1 09/28/2020 05:30 AM    K 4.1 09/28/2020 05:30 AM    NA 141 09/28/2020 05:30 AM    INR 1.0 09/27/2020 01:47 PM    TROPI <0.01 09/27/2020 01:47 PM    TROPI <0.01 08/30/2020 12:08 PM    TROPI <0.01 05/28/2020 11:24 AM    TROPI <0.01 05/12/2020 12:47 PM    TROPI 0.01 07/15/2009 09:40 AM     CT Head WO Contrast (Order: [id number]) - 09/27/2020  IMPRESSION:    No acute process seen. Stable exam.    MRI Brain W WO Contrast (Order: [id number]) - 09/27/2020  IMPRESSION:         SMALL VESSEL DISEASE. NO ACUTE ABNORMALITY. NO FOCAL ENHANCING LESION.    Social History: (Per family/care provider, if patient is not able to give accurate report)  Lives alonein an apartment.   Entry Steps: 1 flight, no elevator accessRails: yesInside steps: 0Rails: 0   Equipment at home: tub/shower combination   Prior Level of Function: indep all ADL/IADL, no DME/AE at home, cooks, drives   Cognition: WFL   Mobility: independent   Feeding: independent   Grooming: independent   Bathing: independent   Dressing: independent   Toileting: independent    Subjective     Patient is agreeable to participation in the therapy session. Nursing clears patient for therapy.  Patient's Goal:  Go Home  Pain: pt denies pain      Objective     Precautions:   Precautions  Other Precautions: falls    Patient is in bed with  Telemetry and Intravenous Access  in  place.       Observation of patient/vitals:   Vitals:    09/28/20 1028 09/28/20 1030 09/28/20 1031 09/28/20 1202   BP: 130/65 122/72 121/75 126/68   Pulse: (!) 59 (!) 59 68 (!) 57  Resp:       Temp:    98.2 F (36.8 C)   TempSrc:    Oral   SpO2: 99% 99%  100%   Weight:       Height:           Orientation/Cognition:     Alert and Oriented x 3  Cognition: WFL      Musculoskeletal Examination:     ROM Strength   RUE WFL WFL   LUE WFL WFL         Sensation: Intact to light touch, denies numbness/tingling throughout B UE's.   Coordination: Intact gross motor and serial opposition to B hands    Vision: WFL  Hearing: WFL      Functional Mobility:  Rolling: Indep    Supine to sit: Indep  Scooting: Indep  Sit to Supine: Indep  Sit to stand: Indep  Stand to sit: Indep      Balance:  Static Sit Balance: good  Dynamic Sit Balance: good  Static Stand Balance: good      Self Care:  Feeding: Indep  LB Dressing: Indep don socks      Endurance: not challenged    Participation:  good    Education:  Educated the patient to role of occupational therapy, plan of care, goals  of therapy, rationale for progressing mobility and safety with mobility and ADLs.    RN notified of session outcome and that patient was left in EOB with all needs met and equipment intact.   Safety measures include: handoff to nurse/clin tech/ unit secretary completed, bed alarm activated, oriented to call bell and placed within reach, personal items within reach, assistive device positioned out of reach and bed placed in lowest position.   Mobility and ADL status posted at bedside and within E.M.R.                Goals:  Goals  Goal Formulation: Patient  Goals:  (No goals indicated)    Modified Rankin Scale   I am certified in mRS: Yes  mRS Assessment Source: Patient  mRS Value: 0 - No symptoms      Therapist PPE during session procedural mask and gloves      Signature:   Kathlene November, OT  09/28/2020  12:44 PM      (For scheduling questions, please contact  rehab tech [phone])  Attention MD:   Thank you for allowing [place] to participate in the care of Emily [name]. Regulations from the [employer] for Medicare and Medicaid Services (CMS) require your review and approval of this plan of care.     Please cosign this note indicating you are in agreement with theTherapy Plan of Care so we may initiate the therapy treatment plan.

## 2020-09-28 NOTE — Progress Notes (Signed)
Patient given AVS and all questions and concerns addressed. Printed prescriptions given in discharge folder. IV removed and site intact. Tele removed and returned to monitor tech. Patient transported in stable condition to personal vehicle via wheelchair. Discharge checklist reviewed. Personal belongings returned to patient.

## 2020-09-28 NOTE — SLP Progress Note (Signed)
Consult received/chart review completed. Consult received per stroke protocol as patient admitted with L extremity weakness. MRI negative for acute infarction. Patient with no reported changes in cognitive communicative functioning. Passed RN dysphagia screen. No SLP intervention indicated at this time.     Gaylan Gerold, M.Ed CCC-SLP  09/28/2020  Phone: [phone]

## 2020-09-28 NOTE — Progress Notes (Signed)
The learning abilities of the patient and/or caregiver have been assessed. Today's individualized plan of care includes neurological checks every 4 hours. The patient or caregiver states the following personal goal related to the patient's deficit(s): "By discharge I want to be able to be stable." The plan of care was discussed with the patient and/or caregiver, who agrees to it and demonstrates understanding of the disease process, risk factors, treatment plan, medications and consequences of noncompliance. All questions and concerns were addressed.

## 2020-09-28 NOTE — Discharge Summary (Signed)
MEDICINE DISCHARGE SUMMARY    Date Time: 09/28/20 1:53 PM  Patient Name: Emily [name]  Attending Physician: Mahlon Gammon, Belva Crome,*  Primary Care Physician: Skeet Simmer, MD    Date of Admission: 09/27/2020  Date of Discharge: 09/28/2020    Discharge Diagnoses:     . History of hypothyroidism   . History of breast cancer   . Left-sided weakness   . Mixed hyperlipidemia   . Hypertension           Disposition:    Disposition: home     Pending Results, Recommendations & Instructions to providers after discharge:     1. Micro / Labs / Path pending:   Unresulted Labs     None            Procedures/Radiology performed:   Radiology: all results from this admission  XR Chest 2 Views    Result Date: 08/30/2020   1. No focal consolidations. Trace right pleural effusion versus mild pleural thickening. 2. Nodular opacity projecting over the right lower lung is most likely related to the overlying soft tissues however a pulmonary nodule cannot be excluded. Chest CT is recommended for further evaluation in a nonurgent setting. Gustavus Messing, MD  08/30/2020 12:51 PM    CT Head WO Contrast    Result Date: 09/27/2020   No acute process seen. Stable exam. Charlene Brooke, MD  09/27/2020 1:37 PM    CT Cervical Spine WO Contrast    Result Date: 09/27/2020   1. Cervical and thoracic spondylosis unchanged compared to prior CT angiogram. The canal and neural foramen are narrowed at multiple levels. No acute fracture is seen. 2. Enlarged and inhomogeneous thyroid with a right-sided thyroid lesion unchanged compared to prior CT angiogram. Charlene Brooke, MD  09/27/2020 5:09 PM    MR Angiogram Head WO Contrast    Result Date: 09/27/2020   NORMAL MRA OF THE HEAD. Darnelle Maffucci, MD  09/27/2020 7:02 PM    MRI Brain W WO Contrast    Result Date: 09/27/2020      SMALL VESSEL DISEASE. NO ACUTE ABNORMALITY. NO FOCAL ENHANCING LESION. Darnelle Maffucci, MD  09/27/2020 7:10 PM    Chest AP Portable    Result Date: 09/27/2020   No acute process seen.  Stable exam Charlene Brooke, MD  09/27/2020 3:06 PM    Surgery: all results from this admission  * No surgery found [facility] Course:     Reason for admission/ HPI: left sided weakness /numbness, tingling . Please look at H&P for details      Hospital Course: 74 y.o. female with PMH of HTN, dyslipidemia, hypothyroidism, breast cancer s/p Rt mastectomy presented for left sided weakness/numbness/tingling   . Of note she had multiple admission for the  same reason  with neg workup . Work up in ER showed unremarkable Ct head ,neg covid 19 teast. She received MRI brain which was unremarkable . He was seen By Dr Francesco Sor from neurology who felt  may has cervical radiculopathy. suggested outpt cervical MRI   . Her Vit B12, vit D, TSH levels came back at normal range Today she is clinically stable, no weakness left upper and lower ext . She will be discharge today, f/u PMD next week     Discharge condition: stable    Discharge Day Exam:  Today:  BP 126/68   Pulse (!) 57   Temp 98.2 F (36.8 C) (Oral)   Resp 16   Ht 1.6  m (5\' 3" )   Wt 70 kg (154 lb 6.4 oz)   SpO2 100%   BMI 27.35 kg/m   Ranges for the last 24 hours:  Temp:  [97.8 F (36.6 C)-98.6 F (37 C)] 98.2 F (36.8 C)  Heart Rate:  [53-68] 57  Resp Rate:  [14-18] 16  BP: (113-176)/(65-82) 126/68  Body mass index is 27.35 kg/m.    No intake or output data in the 24 hours ending 09/28/20 1353 General: awake, alert ,in no acute distress  Cardiovascular: regular rate and rhythm, no murmurs, rubs or gallops  Lungs: clear to auscultation bilaterally, no additional sounds  Abdomen: soft, non-tender, non-distended; normoactive bowel sounds  Extremities: no edema  Neurological: Alert and oriented X 3, moves all extremities.   Skin: no rashes        Wounds/decutibus ulcers/stage:    Consultations:   Treatment Team:   Attending Provider: Mahlon Gammon, Belva Crome, MD  Consulting Physician: Mahlon Gammon, Belva Crome, MD  Recent Labs - Last 2:       Recent Labs   Lab  09/28/20  0530 09/27/20  1347   WBC 5.56 5.44   Hgb 11.4 12.5   Hematocrit 38.1 41.6   Platelets 300 309      Recent Labs   Lab 09/27/20  1347   PT 11.4   PT INR 1.0   PTT 38      Recent Labs   Lab 09/28/20  0530 09/27/20  1347   Sodium 141 143   Potassium 4.1 4.0   Chloride 104 107   CO2 23 29   BUN 10 13   Creatinine 0.8 0.9   EGFR >60.0 >60.0   Glucose 87 96   Calcium 9.0 9.5     Recent Labs   Lab 09/27/20  1347   Alkaline Phosphatase 97   Bilirubin, Total 0.4   Protein, Total 7.3   Albumin 4.1   ALT 15   AST (SGOT) 19      Recent Labs   Lab 09/28/20  0530   TSH 0.75     Recent Labs   Lab 09/28/20  0530   Cholesterol 216*   Triglycerides 93   HDL 61   LDL Calculated 136*        Microbiology Results (last 15 days)     Procedure Component Value Units Date/Time    COVID-19 (SARS-COV-2) [facility] Rapid) [[id number]] Collected: 09/27/20 1514    Order Status: Completed Specimen: Nasopharyngeal Swab from Nasopharynx Updated: 09/27/20 1538     Purpose of COVID testing Screening     SARS-CoV-2 Specimen Source Nasopharyngeal     SARS CoV 2 Overall Result Negative     Comment: Test performed using the Abbott ID NOW EUA assay.  Please see Fact Sheets for patients and providers located at:  http://www.rice.biz/    This test is for the qualitative detection of SARS-CoV-2  (COVID19) nucleic acid. Viral nucleic acids may persist in vivo,  independent of viability. Detection of viral nucleic acid does  not imply the presence of infectious virus, or that virus  nucleic acid is the cause of clinical symptoms. Negative  results should be treated as presumptive and, if inconsistent  with clinical signs and symptoms or necessary for patient  management, should be tested with an alternative molecular  assay. Negative results do not preclude SARS-CoV-2 infection  and should not be used as the sole basis for patient  management decisions. Invalid results may be due to inhibiting  substances  in the specimen and recollection  should occur.         Narrative:      o Collect and clearly label specimen type:  o Upper respiratory specimen: One Nasopharyngeal Dry Swab NO  Transport Media.  o Hand deliver to laboratory ASAP  Indication for testing->Extended care facility admission to  semi private room  Screening          Discharge Instructions & Follow Up Plan for Patient:   Discharge Diet: Diet heart healthy  Activity/Weight Bearing Status: as tolerated   Patient was instructed to follow up with:      Follow-up Information     Dennemeyer, Dema Severin, MD .    Specialty: Family Medicine  Contact information:  [address]  [place] [address]  [phone]                         Complete instructions and follow up are in the patient's After Visit Summary    Minutes spent coordinating discharge and reviewing discharge plan: 45  minutes    Discharge Medications:        Medication List      CHANGE how you take these medications    anastrozole 1 MG tablet  Commonly known as: ARIMIDEX  Take 1 tablet (1 mg total) by mouth daily  What changed: when to take this        CONTINUE taking these medications    amLODIPine 10 MG tablet  Commonly known as: NORVASC  Take 1 tablet (10 mg total) by mouth daily  Start taking on: September 29, 2020     aspirin 81 MG chewable tablet  Chew 1 tablet (81 mg total) by mouth daily     desonide 0.05 % ointment  Commonly known as: DESOWEN  Apply topically 2 (two) times daily     ezetimibe 10 MG tablet  Commonly known as: ZETIA  Take 1 tablet (10 mg total) by mouth daily     guaiFENesin 600 MG 12 hr tablet  Commonly known as: MUCINEX  Take 2 tablets (1,200 mg total) by mouth 2 (two) times daily     IRON PO     lactobacillus/streptococcus Caps  Take 1 capsule by mouth daily     levothyroxine 25 MCG tablet  Commonly known as: SYNTHROID  Take 1 tablet (25 mcg total) by mouth every morning     lisinopril 40 MG tablet  Commonly known as: ZESTRIL  Take 1 tablet (40 mg total) by mouth daily  Start taking on: September 29, 2020     magnesium oxide 400 MG tablet  Commonly known as: MAG-OX  Take 1 tablet (400 mg total) by mouth daily     meclizine 25 MG tablet  Commonly known as: ANTIVERT  Take 1 tablet (25 mg total) by mouth 3 (three) times daily as needed for Dizziness     multivitamin with minerals tablet     pantoprazole 40 MG tablet  Commonly known as: PROTONIX  Take 1 tablet (40 mg total) by mouth every morning before breakfast     PREVAGEN PO     rosuvastatin 40 MG tablet  Commonly known as: CRESTOR  Take 0.5 tablets (20 mg total) by mouth daily     vitamin B-1 100 MG tablet  Commonly known as: THIAMINE        STOP taking these medications    ALEVE PO     loperamide  2 MG capsule  Commonly known as: IMODIUM     simvastatin 40 MG tablet  Commonly known as: ZOCOR           Where to Get Your Medications      You can get these medications from any pharmacy    Bring a paper prescription for each of these medications   amLODIPine 10 MG tablet   lisinopril 40 MG tablet   rosuvastatin 40 MG tablet         Immunizations provided:   Immunization History   Administered Date(s) Administered   . COVID-19 mRNA Vaccine Preservative Free 0.5 mL (MODERNA) 12/19/2019, 01/16/2020       Signed by: Yates Decamp, MD, MD  [facility]  Department of Medicine  CC: Abran Cantor, Dema Severin, MD

## 2020-09-28 NOTE — Plan of Care (Signed)
Problem: Safety  Goal: Patient will be free from injury during hospitalization  Outcome: Progressing  Flowsheets (Taken 09/28/2020 0331)  Patient will be free from injury during hospitalization:   Assess patient's risk for falls and implement fall prevention plan of care per policy   Provide and maintain safe environment   Use appropriate transfer methods   Ensure appropriate safety devices are available at the bedside   Include patient/ family/ care giver in decisions related to safety   [place] rounding   Assess for patients risk for elopement and implement Elopement Risk Plan per policy  Goal: Patient will be free from infection during hospitalization  Outcome: Progressing  Flowsheets (Taken 09/28/2020 0331)  Free from Infection during hospitalization:   Assess and monitor for signs and symptoms of infection   Monitor lab/diagnostic results   Monitor all insertion sites (i.e. indwelling lines, tubes, urinary catheters, and drains)   Encourage patient and family to use good hand hygiene technique     Problem: Day of Admission - Stroke  Goal: Core/Quality measure requirements - Admission  Outcome: Progressing  Flowsheets (Taken 09/28/2020 0332)  Core/Quality measure requirements - Admission:   Document NIH Stroke Scale on admission   Document nursing swallow/dysphagia screen on admission. If patient fails, keep patient NPO (follow your hospital protocol on swallowing screening).   VTE Prevention: Ensure anticoagulant(s) administered and/or anti-embolism stockings/devices documented as ordered   Ensure antithrombotic administered or contraindication documented by LIP   If diagnosis or history of Atrial Fib/Atrial Flutter, ensure oral anticoagulation is initiated or contraindication documented by LIP   Begin stroke education on admission (must include Modifiable Risk Factors, Warning Signs and Symptoms of Stroke, Activation of Emergency Medical System and Follow-up Appointments) Ensure handout has been given and  documented.   Ensure lipid panel ordered   Ensure PT/OT and/or SLP ordered

## 2020-09-28 NOTE — Consults (Signed)
NEUROLOGY CONSULTATION    Date Time: 09/28/20 12:57 PM  Patient Name: Emily [name]  Attending Physician: Mahlon Gammon, Majid,*      Assessment & Plan:   Left numbness --?psychogenic ?migraine    Ok to discharge     History of Present Illness:   74 year old comes in with left arm and leg numbness and tingling feels better now all symptoms have resolved     Past Medical History:     Past Medical History:   Diagnosis Date   . Breast cancer 2014    right breast mastectomy   . Exercise-induced angina     negative work up   . GERD (gastroesophageal reflux disease)    . Headache     resolved   . Hyperlipidemia    . Hypertension    . Hypothyroidism    . Low back pain    . Malignant neoplasm of overlapping sites of right female breast 10/19/2015   . Syncope and collapse     Chronic Vertigo   . Vertigo        Meds:    prevagen desowen zetia micines imodium zocor antivert     Allergies   Allergen Reactions   . Morphine Itching   . Morphine And Related Itching and Nausea And Vomiting     dizzy       Social & Family History:     Social History     Socioeconomic History   . Marital status: Widowed   Tobacco Use   . Smoking status: Never Smoker   . Smokeless tobacco: Never Used   Vaping Use   . Vaping Use: Never used   Substance and Sexual Activity   . Alcohol use: Yes     Alcohol/week: 1.0 standard drink     Types: 1 Cans of beer per week     Comment: socially   . Drug use: No   . Sexual activity: Not Currently       Family History   Problem Relation Age of Onset   . Breast cancer Neg Hx            CODE STATUS: full     Review of Systems:   No headache, eye, ear nose, throat problems; no coughing or wheezing or shortness of breath, No chest pain or orthopnea, no abdominal pain, nausea or vomiting, No pain in the body or extremities, no psychiatric, neurological, endocrine, hematological or cardiac complaints except as noted above.           Physical Exam:   Blood pressure 126/68, pulse (!) 57, temperature 98.2 F (36.8 C),  temperature source Oral, resp. rate 16, height 1.6 m (5\' 3" ), weight 70 kg (154 lb 6.4 oz), SpO2 100 %.    HEENT: Normocephalic.no carotid bruits  Lungs:  CTA bil  Abd Soft   Cardiac:  S1,S2, normal rate and rhythm  Neck: supple, no cartoid bruits  Extremities: no edema  Skin: no rashes seen in exposed areas     Neuro:  Level of consciousness:  Alert and appropriate  Oriented:  X 3  Cognition:  Intact naming, recognition, concentration and following complex commands  Cranial Nerves:  II-XII intact  Strength:  No upper extremity drift, 5/5 strength x 4 extremities  Coordination:  Intact FTN testing  Reflexes:  +1 throughout, down going toes bil  Sensation: Intact x 4 extremities to LT, temp Gait:  Deferred      Labs:     Recent  Labs   Lab 09/28/20  0530 09/27/20  1347   Glucose 87 96   BUN 10 13   Creatinine 0.8 0.9   Calcium 9.0 9.5   Sodium 141 143   Potassium 4.1 4.0   Chloride 104 107   CO2 23 29   Albumin  --  4.1   AST (SGOT)  --  19   ALT  --  15   Bilirubin, Total  --  0.4   Alkaline Phosphatase  --  97     Recent Labs   Lab 09/28/20  0530 09/27/20  1347   WBC 5.56 5.44   Hgb 11.4 12.5   Hematocrit 38.1 41.6   MCV 87.0 87.4   MCH 26.0 26.3   MCHC 29.9* 30.0*   Platelets 300 309         Recent Labs     09/27/20  1347   PTT 38   PT 11.4   PT INR 1.0          Radiology Results (24 Hour)     Procedure Component Value Units Date/Time    MRI Brain W WO Contrast [[id number]] Collected: 09/27/20 1903    Order Status: Completed Updated: 09/27/20 1912    Narrative:      MRI BRAIN W WO CONTRAST    CLINICAL INDICATION: CVA left sided weakness    TECHNIQUE:  Precontrast images were obtained in the sagittal and axial  planes utilizing T1, proton density, T2-weighted spin-echo pulse  sequences. Axial images were obtained with diffusion-weighted technique  and FLAIR technique. Following intravenous 14 cc Clariscan  administration, axial and coronal T1-weighted images were obtained.    FINDINGS:      The ventricles are normal  in size, contour and position. The sulci are  symmetrical and there are no abnormal extraaxial fluid collections. The  brainstem, cerebellum and the craniocervical junction are unremarkable.  The contrast enhancement pattern is normal. No evidence of restricted  diffusion on the diffusion-weighted sequences.   There is presence of areas of white matter hyperintensity on the  T2-weighted images suggestive of small vessel ischemic disease. No  evidence for an acute intracranial abnormality. No focal enhancing  lesion on the postcontrast scan.      Impression:            SMALL VESSEL DISEASE. NO ACUTE ABNORMALITY. NO FOCAL ENHANCING LESION.    Darnelle Maffucci, MD   09/27/2020 7:10 PM    MR Angiogram Head WO Contrast [[id number]] Collected: 09/27/20 1844    Order Status: Completed Updated: 09/27/20 1904    Narrative:      MR ANGIOGRAM HEAD WO CONTRAST    MRA OF THE HEAD WITHOUT CONTRAST    CLINICAL INDICATION: left sided weakness. CVA.    TECHNIQUE:  3D time-of-flight methodology was utilized to assess the  vessels at the base of the brain and the major branches of the circle of  Willis. Multiplanar reconstructed images were created from the source  data.    FINDINGS: The distal internal carotid arteries and the basilar artery  are normal in course and caliber. The proximal portions of the anterior,  middle and posterior cerebral arteries are normal in course and caliber.  No aneurysms or vascular abnormalities are evident.      Impression:       NORMAL MRA OF THE HEAD.    Darnelle Maffucci, MD   09/27/2020 7:02 PM    CT Cervical Spine WO Contrast [[id number]] Collected: 09/27/20 1706  Order Status: Completed Updated: 09/27/20 1711    Narrative:      HISTORY: 74 year old female with acute neck pain, shoulder pain,  stiffness and tenderness after trauma    COMPARISON: Prior CT angiogram the neck from April    TECHNIQUE: Non enhanced CT of the cervical spine was performed. Sagittal  and coronal reconstruction images were obtained.  Soft tissue and bony  windows were applied. A combination of automated exposure control,  adjustment of the mA and/or kV according to patient size and/or use of  iterative reconstruction technique was utilized.        FINDINGS: There is diffuse osteopenia. No acute fracture is seen. There  is multilevel cervical spondylosis as well spondylosis at T1-T2 and  T2-T3.    C1 and C2 are intact. The skull base is unremarkable.    Visualized mastoid air cells are unremarkable.    The canal and neural foramen are narrowed at C3-4, C4-5, C5-6, C6-7 and  C7-T1 by facet arthritis and cervical spondylosis.    There is diffuse vascular calcification without a well-defined aneurysm.    No hematoma or mass is noted.    The thyroid is inhomogeneous and there is a 1.2 cm area of fairly  well-defined low attenuation in the right thyroid unchanged compared to  prior CT angiogram.      Impression:         1. Cervical and thoracic spondylosis unchanged compared to prior CT  angiogram. The canal and neural foramen are narrowed at multiple levels.  No acute fracture is seen.  2. Enlarged and inhomogeneous thyroid with a right-sided thyroid lesion  unchanged compared to prior CT angiogram.    Charlene Brooke, MD   09/27/2020 5:09 PM    Chest AP Portable [[id number]] Collected: 09/27/20 1505    Order Status: Completed Updated: 09/27/20 1508    Narrative:      HISTORY: 74 year old female with acute anterior chest pain, shortness of  breath.    COMPARISON: Prior study from November.    FINDINGS: Single portable AP radiograph of the chest was performed. No  airspace disease, pleural effusion, or pneumothorax is seen. Tortuous  aorta is again noted. The heart is not enlarged. Surgical clips again  overlie the right chest. Wires are again seen overlying the chest.    Trachea is judged unremarkable.      Impression:       No acute process seen. Stable exam    Charlene Brooke, MD   09/27/2020 3:06 PM    CT Head WO Contrast [[id number]] Collected:  09/27/20 1335    Order Status: Completed Updated: 09/27/20 1339    Narrative:      INDICATION: 74 year old female with left-sided weakness, numbness and  tingling.    COMPARISON: Prior study from August (#2) and prior MR from April    TECHNIQUE: Images are obtained from the skull base to the vertex without  contrast administration. A combination of automated exposure control,  adjustment of the mA and/or kV according to patient size and/or use of  iterative reconstruction technique was utilized.        FINDINGS: The ventricles appear normal in size and configuration, and no  shift is seen. No acute bleed or fluid collection is detected. The  visualized sinuses are aerated. Mild diffuse cortical atrophy is seen.  No mass is seen. No skull lesion is found.      Impression:       No acute process seen. Stable exam.  Charlene Brooke, MD   09/27/2020 1:37 PM           All recent brain and spine imaging (MRI, CT) personally reviewed.    Chart reviewed    Case discussed with:  minutes; >50% time spent in counseling or coordination of care        This note was generated by the Epic EMR system/Speech recognition and may contain inherent errors or omissions not intended by the user. Grammatical errors, random word insertions, deletions and pronoun errors  are occasional consequences of this technology due to software limitations.   Not all errors are caught or corrected. If there are questions or concerns about the content of this note or information contained within the body of this dictation they should be addressed directly with the author for clarification.    Signed by: Cathe Mons, MD, MD  Spectralink: [phone]      Answering Service: [phone]

## 2020-09-28 NOTE — Progress Notes (Signed)
Full note dictated confirmation number is [id number]  Cathe Mons, MD

## 2020-09-28 NOTE — Progress Notes (Signed)
Recommendation  Discharge Recommendation: Home with outpatient PT  DME Recommended for Discharge: No additional equipment/DME recommended at this time

## 2020-09-28 NOTE — PT Eval Note (Signed)
[facility]  [address]  [place], [place] [address]  [phone]    Physical Therapy Evaluation    Patient: Emily [name] MRN: [id number]   Unit: [facility] INTERMED[facility]ARE Bed: MI622/MI622-02    Time of Treatment: Time Calculation  PT Received On: 09/28/20  Start Time: 1020  Stop Time: 1047  Time Calculation (min): 27 min    Consult received for Emily [name] for PT evaluation and treatment.  Patient's medical condition is appropriate for Physical Therapy  intervention at this time.    Interpreter utilized: no, not indicated      D/C Suggestions   Based on today's performance:  Recommendation  Discharge Recommendation: Home with outpatient PT  DME Recommended for Discharge: No additional equipment/DME recommended at this time  PT Frequency: one time visit - therapy discontinued.    Transport Recommendations: no limitations    Discharge recommendations are based on patient's progression/regression. Please see most recent note for updated discharge recommendations.    Assessment   Emily [name] is a 74 y.o. female with h/o HTN, dyslipidemia, hypothyroidism, breast cancer s/p Rt mastectomy admitted 09/27/2020 with left sided weakness and numbness. CT head and MRI negative for acute intracranial abnormalities. Patient lives alone in a 2nd floor apartment with 5 steps to enter with handrails and 3 to 4 steps with handrails to 2nd floor. At baseline, patient drives and is independent with mobility, ADLs and IADLs. Patient denies recent falls. At time of eval, patient patient presented lying in bed. Patient denies weakness on L UE and L LE currently. Patient independent with bed mobility, STS, and ambulated 150 ft without AD indep. Patient also performed stair ambulation 1 FOS single handrail use with SPV. Patient is functioning at baseline level and does not warrant further acute PT services. Patient did endorse pain on both hands due to arthritis, right shoulder pain, and neck pain. Recommending D/C to home  with outpatient PT to address musculoskeletal pain complaints.        Pt's functional mobility is impacted by:  none.  There are no comorbidities or other factors that affect plan of care and require modification of task.  Standardized tests and exams incorporated into evaluation include balance and Strength.  Pt demonstrates a stable clinical presentation.     Complexity Level Hx and Co  morbidites Examination Clinical Decision Making Clinical Presentation   Low  no impact 1-2 elements Limited options Stable       PMP - Progressive Mobility Protocol   PMP Activity: Step 4 - Dangle at Bedside  Distance Walked (ft) (Step 6,7): 150 Feet          Interdisciplinary Communication: RN, OT      Plan       Plan  Risks/Benefits/POC Discussed with Pt/Family: With patient  Treatment/Interventions: No skilled interventions needed at this time  PT Frequency: one time visit - therapy discontinued         Medical Diagnosis: Left-sided weakness [R53.1]  COVID-19 ruled out by laboratory testing [Z20.822]    History of Present Illness: Emily [name] is a 74 y.o. female admitted on  09/27/2020 with "ROMINA DIVIRGILIO is a 75 y.o. female with PMH of HTN, dyslipidemia, hypothyroidism, breast cancer s/p Rt mastectomy presented for left sided weakness/numbness  . Of note she had multiple admission for the  same reason  With neg workup . She states about left upper and lower ext weakness and numbness when she was about to go to  bathroom. No slurred  Speech, no facial droop, no seizure. But Had mild dizziness. Work up in ER showed unremarkable Ct head ,neg covid 19 teast  "      Patient Active Problem List   Diagnosis   . Malignant neoplasm of overlapping sites of right female breast   . Hypertension   . Mixed hyperlipidemia   . Hypothyroid   . Right sided numbness   . Chest discomfort   . SOB (shortness of breath)   . Left sided numbness   . Left-sided weakness   . COVID-19 ruled out by laboratory testing   . Acute colitis   . Dizziness   .  Right lower quadrant abdominal pain   . Abnormal CT scan, gastrointestinal tract   . History of hypothyroidism   . History of breast cancer     Past Medical History:   Diagnosis Date   . Breast cancer 2014    right breast mastectomy   . Exercise-induced angina     negative work up   . GERD (gastroesophageal reflux disease)    . Headache     resolved   . Hyperlipidemia    . Hypertension    . Hypothyroidism    . Low back pain    . Malignant neoplasm of overlapping sites of right female breast 10/19/2015   . Syncope and collapse     Chronic Vertigo   . Vertigo      Past Surgical History:   Procedure Laterality Date   . COLONOSCOPY, POLYPECTOMY N/A 07/21/2020    Procedure: COLONOSCOPY, POLYPECTOMY;  Surgeon: Einar Grad, MD;  Location: [facility];  Service: Gastroenterology;  Laterality: N/A;   . EGD, BIOPSY N/A 07/21/2020    Procedure: EGD, BIOPSY;  Surgeon: Einar Grad, MD;  Location: [facility];  Service: Gastroenterology;  Laterality: N/A;   . HYSTERECTOMY     . MASTECTOMY Right 2015       X-Rays/Tests/Labs:  Lab Results   Component Value Date/Time    HGB 11.4 09/28/2020 05:30 AM    HCT 38.1 09/28/2020 05:30 AM    K 4.1 09/28/2020 05:30 AM    NA 141 09/28/2020 05:30 AM    INR 1.0 09/27/2020 01:47 PM    TROPI <0.01 09/27/2020 01:47 PM    TROPI <0.01 08/30/2020 12:08 PM    TROPI <0.01 05/28/2020 11:24 AM    TROPI <0.01 05/12/2020 12:47 PM    TROPI 0.01 07/15/2009 09:40 AM       Social History: (Per family/care provider, if patient is not able to give accurate report)  Lives alone in 2nd floor apartment.  Entry Steps: 5   Rails: yes Inside steps: 3-4   Rails: yes  Equipment at home:  tub/shower combination  Prior Level of Function:     Cognition: intact    Mobility/Locomotion: indep   Feeding: indep   Grooming: indep   Bathing: indep   Dressing: indep   Toileting: indep    Subjective   Patient is agreeable to participation in the therapy session. Nursing clears patient for therapy.  Patient's Goal:  Return home  Pain:  did not quantify pain  Location: hands, R shoulder, and neck pain  Therapist Intervention: positioned for comfort  Patient is satisfied with therapist intervention.    Objective     Precautions/ Contraindications:   Precautions  Weight Bearing Status: no restrictions  Other Precautions: falls    Patient is in bed with  Telemetry in place.  Observation of patient/vitals:      Vitals:    09/28/20 1028 09/28/20 1030 09/28/20 1031 09/28/20 1202   BP: 130/65 122/72 121/75 126/68   Pulse: (!) 59 (!) 59 68 (!) 57   Resp:       Temp:    98.2 F (36.8 C)   TempSrc:    Oral   SpO2: 99% 99%  100%   Weight:       Height:           Orientation/Cognition:  Alert and Oriented x 4  Cognition: intact      Musculoskeletal Examination:      ROM Strength   RUE WFL WFL   LUE WFL WFL   RLE WFL WFL   LLE WFL WFL     Sensation: intact light touch B UE and B LE    Functional Mobility:  Rolling: indep    Supine to sit: indep  Scooting: indep  Sit to Supine: indep  Sit to stand: indep  Stand to sit: indep  Transfers: indep  Ambulation:     Weightbearing: no restrictions   Assistance level: indep   Distance: 150'   Assistive Device: no AD   Gait Deviations: slightly decreased gait speed, WFL gait technique   Stairs: 1 FOS (10 steps) ascending step to step pattern and descending step to step pattern with handrail use SPV    Balance:  Static Sit: good  Dynamic Sit: good  Static Stand: good  Dynamic Stand: good    Endurance: good    Participation:  good    Education:  Educated the patient to role of physical therapy, plan of care, goals  of therapy, rationale for progressing mobility and safety with mobility and ADLs.    RN notified of session outcome and that patient was left in bed with all needs met and equipment intact.   Safety measures include: handoff to nurse/clin tech/ unit secretary completed.   Mobility and ADL status posted at bedside and within E.M.R.                       Modified Rankin Scale:      I am certified in mRS:  Yes  mRS Assessment Source: Patient  mRS Value: 0 - No symptoms    Therapist PPE during session procedural mask and gloves     Signature:  Mancel Bale, PT  09/28/2020  1:01 PM  Phone: [phone]     (For scheduling questions, please contact rehab tech [phone])      Attention MD:   Thank you for allowing [place] to participate in the care of [facility]. Regulations from the [employer] Medicare and Medicaid Services (CMS) require your review and approval of this plan of care.     Please cosign this note indicating you are in agreement with theTherapy Plan of Care so we may initiate the therapy treatment plan.

## 2020-09-29 LAB — ECG 12-LEAD
Atrial Rate: 70 {beats}/min
P Axis: 49 degrees
P-R Interval: 180 ms
Q-T Interval: 390 ms
QRS Duration: 88 ms
QTC Calculation (Bezet): 421 ms
R Axis: 23 degrees
T Axis: 19 degrees
Ventricular Rate: 70 {beats}/min

## 2020-09-30 ENCOUNTER — Encounter: Payer: Self-pay | Admitting: Certified Registered"

## 2020-09-30 ENCOUNTER — Ambulatory Visit: Admission: RE | Admit: 2020-09-30 | Payer: Medicare Other | Source: Ambulatory Visit | Admitting: Gastroenterology

## 2020-09-30 SURGERY — COLONOSCOPY, WITH BIOPSY
Anesthesia: Monitor Anesthesia Care | Site: Abdomen

## 2020-09-30 NOTE — Consults (Unsigned)
Service Date: 09/28/2020     Patient Type: V     CONSULTING PHYSICIAN: Cathe Mons MD     REFERRING PHYSICIAN:      REASON FOR CONSULTATION:  Left-sided weakness and numbness.     HISTORY OF PRESENT ILLNESS:  The patient is a 74 year old lady, comes into the hospital because of  intermittent tingling, numbness, and sensation of weakness in her left arm  and left leg that started the night before her admission to the emergency  room.  She was not a candidate for any kind of intervention.       She reports similar symptoms in the past.  She has had multiple evaluations  in the past, which have been unrevealing.  She does sometimes come in with  vertigo, sometimes with headache and left-sided symptoms.     At this time, she reports her symptoms are currently resolved.     PAST MEDICAL HISTORY:  Breast cancer, headaches, hyperlipidemia, hypertension, vertigo.     PAST SURGICAL HISTORY:  Hysterectomy, mastectomy.     FAMILY HISTORY:  High blood pressure.     SOCIAL HISTORY:  Nonsmoker, drinks alcohol socially.       DRUG ALLERGIES:    MORPHINE.     HOME MEDICATIONS:  Norvasc, Arimidex, Privigen, aspirin, ***, Zetia, iron, Synthroid, Imodium,  meclizine, naproxen, Crestor, thiamine.     PHYSICAL EXAMINATION:  GENERAL:  Pleasant lady lying in bed, no obvious apparent distress.  VITAL SIGNS:  Blood pressure 126/68, respirations 16, heart rate 57,  temperature 98.2.  NECK:  No bruits.  LUNGS:  Clear.  CARDIAC:  Heart sounds normal.  ABDOMEN:  Soft.  EXTREMITIES:  No edema.  NEUROLOGIC:  Awake, alert, oriented x3.  Speech fluent.  Spine symmetric.   Moving both arms and legs well, light touch are intact.  Reflexes are  symmetric.  Moving both arms equally.  Finger-to-nose performed well.  MENTAL STATUS:  She is awake, alert, oriented x3.     DIAGNOSTIC DATA:  MRI of the brain with and without contrast shows small vessel disease,  images reviewed personally.  MRA of the head is negative.     LABORATORY DATA:  WBC 5.56.   LDL 136.  B12 1300.     IMPRESSION:  Intermittent left-sided numbness of unclear etiology, could be migraine  phenomena, could be cervical spinal stenosis related, not entirely sure at  this time, given multiple prior admissions, non-physiological/psychogenic  etiology needs to be considered as well.       RECOMMENDATIONS:  1.  At this time, okay for discharge given testing is negative.  2.  Consider C-spine MRI as outpatient.  3.  Consider EMG nerve conduction study as outpatient.  4.  Consider either psychiatry or neuropsych evaluation, for counseling  purposes given multiple admissions with similar symptoms and negative  testing in the past.           D:  09/28/2020 13:14 PM by Dr. Cathe Mons, MD ([id number])  T:  09/30/2020 16:39 PM by NTS      (Conf: [id number]) (Doc ID: [id number])

## 2020-10-20 ENCOUNTER — Encounter (INDEPENDENT_AMBULATORY_CARE_PROVIDER_SITE_OTHER): Payer: Self-pay | Admitting: Cardiology

## 2020-10-20 ENCOUNTER — Ambulatory Visit (INDEPENDENT_AMBULATORY_CARE_PROVIDER_SITE_OTHER): Payer: Medicare Other | Admitting: Cardiology

## 2020-10-20 VITALS — BP 132/73 | HR 90 | Resp 16 | Ht 63.0 in | Wt 160.0 lb

## 2020-10-20 DIAGNOSIS — I10 Essential (primary) hypertension: Secondary | ICD-10-CM

## 2020-10-20 DIAGNOSIS — E782 Mixed hyperlipidemia: Secondary | ICD-10-CM

## 2020-10-20 DIAGNOSIS — E7800 Pure hypercholesterolemia, unspecified: Secondary | ICD-10-CM

## 2020-10-20 MED ORDER — EZETIMIBE 10 MG PO TABS
10.0000 mg | ORAL_TABLET | Freq: Every day | ORAL | 3 refills | Status: DC
Start: 2020-10-20 — End: 2022-05-12

## 2020-10-20 NOTE — Progress Notes (Signed)
[place] [facility] PROGRESS NOTE    I had the pleasure of seeing Emily [name] today for cardiovascular follow up. Marland Kitchen He is a pleasant 75 y.o. female with a history ofatypical chest discomfort July 2020 that led to a normal Lexiscan nuclear scan and echocardiogram both July 2020 with ejection fraction of 60% and no significant valvular abnormality.  The patient was hospitalized with numbness in the left arm at the end of December 2021 and at that time it was not clear that she had any central neurologic event with a negative MRI and no findings to suggest that with neurology.  The patient is doing well at this point    MEDICATIONS:  Current Outpatient Medications   Medication Sig Dispense Refill   . amLODIPine (NORVASC) 10 MG tablet Take 1 tablet (10 mg total) by mouth daily 30 tablet 0   . anastrozole (ARIMIDEX) 1 MG tablet Take 1 tablet (1 mg total) by mouth daily (Patient taking differently: Take 1 mg by mouth every morning    ) 90 tablet 3   . Apoaequorin (PREVAGEN PO) Take by mouth daily     . aspirin 81 MG chewable tablet Chew 1 tablet (81 mg total) by mouth daily 90 tablet 0   . desonide (DESOWEN) 0.05 % ointment Apply topically 2 (two) times daily 15 g 0   . ezetimibe (ZETIA) 10 MG tablet Take 1 tablet (10 mg total) by mouth daily 90 tablet 3   . Ferrous Sulfate (IRON PO) Take by mouth     . guaiFENesin (MUCINEX) 600 MG 12 hr tablet Take 2 tablets (1,200 mg total) by mouth 2 (two) times daily 14 tablet 0   . lactobacillus/streptococcus (RISAQUAD) Cap Take 1 capsule by mouth daily 10 capsule 0   . levothyroxine (SYNTHROID) 25 MCG tablet Take 1 tablet (25 mcg total) by mouth every morning 30 tablet 0   . lisinopril (ZESTRIL) 40 MG tablet Take 1 tablet (40 mg total) by mouth daily 30 tablet 0   . magnesium oxide (MAG-OX) 400 MG tablet Take 1 tablet (400 mg total) by mouth daily 30 tablet 0   . meclizine (ANTIVERT) 25 MG tablet Take 1 tablet (25 mg total) by mouth 3 (three) times daily as needed for Dizziness 10  tablet 0   . Multiple Vitamins-Minerals (MULTIVITAMIN WITH MINERALS) tablet Take 1 tablet by mouth every morning         . pantoprazole (PROTONIX) 40 MG tablet Take 1 tablet (40 mg total) by mouth every morning before breakfast 30 tablet 0   . rosuvastatin (CRESTOR) 40 MG tablet Take 0.5 tablets (20 mg total) by mouth daily 90 tablet 3   . vitamin B-1 (THIAMINE) 100 MG tablet Take 100 mg by mouth every morning.           REVIEW OF SYSTEMS: All other systems reviewed and negative except as above.    PHYSICAL EXAMINATION  General Appearance: well-appearing and in no acute distress.   Vital Signs: BP 132/73 (BP Site: Left arm, Patient Position: Sitting, Cuff Size: Medium)   Pulse 90   Resp 16   Ht 1.6 m (5\' 3" )   Wt 72.6 kg (160 lb)   SpO2 95%   BMI 28.34 kg/m  Repeat blood pressure 122/60    HEENT: Sclera anicteric, conjunctiva without pallor, moist mucous membranes.  Neck: Supple without jugular venous distention.    Chest: Clear to auscultation bilaterally with good air movement and respiratory effort and no wheezes, rales, or  rhonchi  Cardiovascular: Normal S1 and  S2 without murmurs, gallops or rub. PMI of normal size and nondisplaced.   Extremities: Warm without edema.    ECG: Sinus rhythm nonspecific T wave flattening  Past Medical History:   Diagnosis Date   . Breast cancer 2014    right breast mastectomy   . Exercise-induced angina     negative work up   . GERD (gastroesophageal reflux disease)    . Headache     resolved   . Hyperlipidemia    . Hypertension    . Hypothyroidism    . Low back pain    . Malignant neoplasm of overlapping sites of right female breast 10/19/2015   . Syncope and collapse     Chronic Vertigo   . Vertigo      Family History   Problem Relation Age of Onset   . Breast cancer Neg Hx      Social History     Socioeconomic History   . Marital status: Widowed     Spouse name: None   . Number of children: None   . Years of education: None   . Highest education level: None   Occupational  History   . None   Tobacco Use   . Smoking status: Never Smoker   . Smokeless tobacco: Never Used   Vaping Use   . Vaping Use: Never used   Substance and Sexual Activity   . Alcohol use: Yes     Alcohol/week: 1.0 standard drink     Types: 1 Cans of beer per week     Comment: socially   . Drug use: No   . Sexual activity: Not Currently   Other Topics Concern   . None   Social History Narrative   . None     Social Determinants of Health     Financial Resource Strain:    . Difficulty of Paying Living Expenses: Not on file   Food Insecurity:    . Worried About Programme researcher, broadcasting/film/video in the Last Year: Not on file   . Ran Out of Food in the Last Year: Not on file   Transportation Needs:    . Lack of Transportation (Medical): Not on file   . Lack of Transportation (Non-Medical): Not on file   Physical Activity:    . Days of Exercise per Week: Not on file   . Minutes of Exercise per Session: Not on file   Stress:    . Feeling of Stress : Not on file   Social Connections:    . Frequency of Communication with Friends and Family: Not on file   . Frequency of Social Gatherings with Friends and Family: Not on file   . Attends Religious Services: Not on file   . Active Member of Clubs or Organizations: Not on file   . Attends Banker Meetings: Not on file   . Marital Status: Not on file   Intimate Partner Violence:    . Fear of Current or Ex-Partner: Not on file   . Emotionally Abused: Not on file   . Physically Abused: Not on file   . Sexually Abused: Not on file   Housing Stability:    . Unable to Pay for Housing in the Last Year: Not on file   . Number of Places Lived in the Last Year: Not on file   . Unstable Housing in the Last Year: Not on file  IMPRESSION:  Hypertension  Hypercholesterolemia with LDL one thirty-six December 2021  Normal stress nuclear scan July 2020 and echocardiogram July 2020 showed ejection fraction 20% without valvular abnormality  Hospital stay December 2021 with arm numbness but no  acute event by MRI or clinically  Cervical and thoracic spondylosis noted  PLAN:   Continue current treatment 6 months follow-up      Royann Shivers, MD [facility]  10/20/2020

## 2020-10-22 ENCOUNTER — Encounter (INDEPENDENT_AMBULATORY_CARE_PROVIDER_SITE_OTHER): Payer: Self-pay | Admitting: Cardiology

## 2020-11-17 NOTE — Progress Notes (Deleted)
[place] CARDIOLOGY OFFICE VISIT    I had the pleasure of seeing Emily [name] today in cardiac follow up office visit for ***    [name] is a 75 y.o. female with a past medical history significant for hypertension, hyperlipidemia, GERD, Hypothyroidism and breast cancer***.    At today's visit Emily [name] is doing well. *** She denies chest pain, shortness of breath, edema, palpitations, presyncope, or syncope.       She continues to follow-up with Dr. Denyse Dago for general cardiovascular care, last seen 10/20/2020 with future appointment to be scheduled.     Cardiographics:  TTE(01/28/2020):  The left ventricle is normal in size.    Left ventricular wall thickness is mildly increased.    Left ventricular ejection fraction is normal with an estimated ejection fraction of 60-65%.    Left ventricular segmental wall motion is grossly normal, but subtle wall motion abnormalities cannot be excluded as technically limited study.    Left ventricular diastolic filling parameters are consistent with Grade I diastolic dysfunction (impaired relaxation pattern).    The right ventricular cavity size is grossly normal in size and systolic function.    Nuclear Stress Test 04/17/2019   1. Normal stress and rest myocardial perfusion study without evidence of inducible ischemia or scar.    2. Normal left ventricular function with calculated LVEF 77 %.    3. No prior studies are available for comparison.    ECG: ***    PMH:   Patient Active Problem List    Diagnosis Date Noted   . History of hypothyroidism 09/27/2020   . History of breast cancer 09/27/2020   . Right lower quadrant abdominal pain 07/10/2020   . Abnormal CT scan, gastrointestinal tract 07/10/2020   . Dizziness 05/28/2020   . Acute colitis 03/25/2020   . COVID-19 ruled out by laboratory testing 03/10/2020   . Left-sided weakness 01/27/2020   . Left sided numbness 04/15/2019   . Chest discomfort 03/15/2019   . SOB (shortness of breath) 03/15/2019   . Right  sided numbness 11/13/2018   . Hypothyroid 04/03/2018   . Mixed hyperlipidemia 03/15/2018   . Hypertension 01/19/2018   . Malignant neoplasm of overlapping sites of right female breast 10/19/2015        MEDICATIONS:     Current Outpatient Medications   Medication Sig Dispense Refill   . amLODIPine (NORVASC) 10 MG tablet Take 1 tablet (10 mg total) by mouth daily 30 tablet 0   . anastrozole (ARIMIDEX) 1 MG tablet Take 1 tablet (1 mg total) by mouth daily (Patient taking differently: Take 1 mg by mouth every morning    ) 90 tablet 3   . Apoaequorin (PREVAGEN PO) Take by mouth daily     . aspirin 81 MG chewable tablet Chew 1 tablet (81 mg total) by mouth daily 90 tablet 0   . desonide (DESOWEN) 0.05 % ointment Apply topically 2 (two) times daily 15 g 0   . ezetimibe (ZETIA) 10 MG tablet Take 1 tablet (10 mg total) by mouth daily 90 tablet 3   . Ferrous Sulfate (IRON PO) Take by mouth     . guaiFENesin (MUCINEX) 600 MG 12 hr tablet Take 2 tablets (1,200 mg total) by mouth 2 (two) times daily 14 tablet 0   . lactobacillus/streptococcus (RISAQUAD) Cap Take 1 capsule by mouth daily 10 capsule 0   . levothyroxine (SYNTHROID) 25 MCG tablet Take 1 tablet (25 mcg total) by mouth  every morning 30 tablet 0   . lisinopril (ZESTRIL) 40 MG tablet Take 1 tablet (40 mg total) by mouth daily 30 tablet 0   . magnesium oxide (MAG-OX) 400 MG tablet Take 1 tablet (400 mg total) by mouth daily 30 tablet 0   . meclizine (ANTIVERT) 25 MG tablet Take 1 tablet (25 mg total) by mouth 3 (three) times daily as needed for Dizziness 10 tablet 0   . Multiple Vitamins-Minerals (MULTIVITAMIN WITH MINERALS) tablet Take 1 tablet by mouth every morning         . pantoprazole (PROTONIX) 40 MG tablet Take 1 tablet (40 mg total) by mouth every morning before breakfast 30 tablet 0   . rosuvastatin (CRESTOR) 40 MG tablet Take 0.5 tablets (20 mg total) by mouth daily 90 tablet 3   . vitamin B-1 (THIAMINE) 100 MG tablet Take 100 mg by mouth every morning.        No current facility-administered medications for this visit.        SH:   Social History     Tobacco Use   . Smoking status: Never Smoker   . Smokeless tobacco: Never Used   Vaping Use   . Vaping Use: Never used   Substance Use Topics   . Alcohol use: Yes     Alcohol/week: 1.0 standard drink     Types: 1 Cans of beer per week     Comment: socially   . Drug use: No       REVIEW OF SYSTEMS: All other systems reviewed and negative except as above.    PHYSICAL EXAMINATION ***  General Appearance: A well-appearing female in no acute distress.   Vital Signs: There were no vitals taken for this visit.   HEENT: Sclera anicteric, conjunctiva without pallor, moist mucous membranes, normal dentition.   Neck: Supple without jugular venous distention. Thyroid nonpalpable. Normal carotid upstrokes without bruits.  Chest: Clear to auscultation bilaterally with good air movement and respiratory effort and no wheezes, rales, or rhonchi  Cardiovascular: Normal S1 and physiologically split S2 without murmurs, gallops or rub. PMI of normal size and nondisplaced.   Abdomen: Soft, nontender. No organomegaly.  No pulsatile masses or bruits.    Extremities: Warm without edema. All peripheral pulses are full and equal.  Skin: No rash, xanthoma or xanthelasma.   Neuro: Alert and oriented x3. Grossly intact. Strength is symmetrical. Normal mood and affect.     LABS:  09/27/2020-09/28/2020: Total cholesterol 216, HDL 61, LDL 136, triglycerides 93, AST 19, ALT 15, glucose 87, BUN 10, creatinine 0.8, sodium 141, potassium 4.1, chloride 104, CO2 23, calcium 9.9, thyroid 0.75, troponin <0.01    ASSESSMENT & PLAN:   No diagnosis found.       ***  *HTN    *HLD    *Breast CA    *Cervical/thoracic spondylosis    ----------------------------  Phillis Haggis, AGNP-C  [facility]  Ph [phone]      Incident to service performed with physician present in the office. The physician's plan of care was implemented. ***This case was  discussed today with Dr. Marland Kitchen and a plan of care developed and carried out.

## 2020-11-18 ENCOUNTER — Ambulatory Visit (INDEPENDENT_AMBULATORY_CARE_PROVIDER_SITE_OTHER): Payer: Medicare Other | Admitting: Registered Nurse

## 2020-11-19 ENCOUNTER — Encounter (INDEPENDENT_AMBULATORY_CARE_PROVIDER_SITE_OTHER): Payer: Self-pay | Admitting: Internal Medicine

## 2020-11-19 ENCOUNTER — Ambulatory Visit (INDEPENDENT_AMBULATORY_CARE_PROVIDER_SITE_OTHER): Payer: Medicare Other | Admitting: Internal Medicine

## 2020-11-19 VITALS — BP 145/70 | HR 65 | Wt 159.2 lb

## 2020-11-19 DIAGNOSIS — Z86010 Personal history of colonic polyps: Secondary | ICD-10-CM

## 2020-11-19 DIAGNOSIS — K5900 Constipation, unspecified: Secondary | ICD-10-CM

## 2020-11-19 MED ORDER — POLYETHYLENE GLYCOL 3350 17 GM/SCOOP PO POWD
17.0000 g | Freq: Every day | ORAL | 3 refills | Status: AC
Start: 2020-11-19 — End: 2020-12-19

## 2020-11-19 MED ORDER — METAMUCIL 28 % PO PACK
1.0000 | PACK | Freq: Two times a day (BID) | ORAL | 3 refills | Status: AC
Start: 2020-11-19 — End: 2021-03-19

## 2020-11-19 NOTE — Patient Instructions (Signed)
Patient Instructions:    - Counseled dietary modification consisting of adequate fluid and fiber intake.  - Counseled regarding defecation habits.  - Fiber supplementation: Metamucil over-the-counter, 1 packet once to twice daily.  - Miralax 17 grams once to twice daily for constipation. The goal is to have 1 to 2 soft formed stools per today.  - Advised patient to stop magnesium oxide given her advanced age and risk of electrolyte disturbance.      Diagnostic tests and referrals placed today:    No orders of the defined types were placed in this encounter.      Patients can now log into MyChart to review and print their Laboratory, Radiology and Referral Requisition (orders).   You will receive an email notifying you to login to your MyChart account.   1. Click Messaging   2. Click Letters   3. Select the requisition   4. Review the requisition & click print   Please note, for multiple requisitions, there will be page breaks in between each requisition (order).    Start taking the following new medications prescribed today:  No orders of the defined types were placed in this encounter.       Stop taking the following medications:  There are no discontinued medications.        ---------------

## 2020-11-19 NOTE — Progress Notes (Signed)
[address], [address]  [place], [place] [address]  Phone:[phone]  [phone]      FOLLOW UP VISIT    Date of Visit: 11/19/2020 10:52 AM  Patient ID: Emily [name] is a 75 y.o. female.  Referring Physician: @REFPROVFL @       Reason for Consultation:   Constipation  History of adenomatous colon polyp    History:   Emily [name] is a 75 y.o. female with a history of breast cancer, status post right breast mastectomy, hypertension, hyperlipidemia, hypothyroidism, chronic vertigo, who presents for constipation and a history of adenomatous colon polyp.    Reported history of chronic constipation for many years, which worsened in the last several months, she has on average 1 BM every 1 to 3 days and a Bristol stool form scale type I stool.  He used over-the-counter stool softener and magnesium oxide.  The constipation improved.  She was previously seen in the GI office in 06/2020 for evaluation of abnormal CT finding concerning for colitis and iron deficiency anemia.  EGD showed mild antral erythema for which she received PPI therapy.  Colonoscopy was unremarkable except for a 1 mm adenoma which was resected.  Repeat CBC and iron studies showed iron deficiency anemia has resolved.      Problem List:     Patient Active Problem List   Diagnosis   . Malignant neoplasm of overlapping sites of right female breast   . Hypertension   . Mixed hyperlipidemia   . Hypothyroid   . Right sided numbness   . Chest discomfort   . SOB (shortness of breath)   . Left sided numbness   . Left-sided weakness   . COVID-19 ruled out by laboratory testing   . Acute colitis   . Dizziness   . Right lower quadrant abdominal pain   . Abnormal CT scan, gastrointestinal tract   . History of hypothyroidism   . History of breast cancer       Past Medical History:     Past Medical History:   Diagnosis Date   . Breast cancer 2014    right breast mastectomy   . Exercise-induced angina     negative work up   . GERD (gastroesophageal reflux  disease)    . Headache     resolved   . Hyperlipidemia    . Hypertension    . Hypothyroidism    . Low back pain    . Malignant neoplasm of overlapping sites of right female breast 10/19/2015   . Syncope and collapse     Chronic Vertigo   . Vertigo        Past Surgical History:     Past Surgical History:   Procedure Laterality Date   . COLONOSCOPY  2021   . COLONOSCOPY, POLYPECTOMY N/A 07/21/2020    Procedure: COLONOSCOPY, POLYPECTOMY;  Surgeon: Einar Grad, MD;  Location: [facility];  Service: Gastroenterology;  Laterality: N/A;   . EGD, BIOPSY N/A 07/21/2020    Procedure: EGD, BIOPSY;  Surgeon: Einar Grad, MD;  Location: [facility];  Service: Gastroenterology;  Laterality: N/A;   . HYSTERECTOMY     . MASTECTOMY Right 2015       Current Medications:   has a current medication list which includes the following prescription(s): amlodipine, anastrozole, apoaequorin, aspirin, desonide, ezetimibe, ferrous sulfate, guaifenesin, lactobacillus/streptococcus, levothyroxine, lisinopril, magnesium oxide, multivitamin with minerals, pantoprazole, rosuvastatin, vitamin b-1, meclizine, polyethylene glycol, and metamucil.     Outpatient Medications Marked  as Taking for the 11/19/20 encounter (Office Visit) with Einar Grad, MD   Medication Sig Dispense Refill   . amLODIPine (NORVASC) 10 MG tablet Take 1 tablet (10 mg total) by mouth daily 30 tablet 0   . anastrozole (ARIMIDEX) 1 MG tablet Take 1 tablet (1 mg total) by mouth daily (Patient taking differently: Take 1 mg by mouth every morning    ) 90 tablet 3   . Apoaequorin (PREVAGEN PO) Take by mouth daily     . aspirin 81 MG chewable tablet Chew 1 tablet (81 mg total) by mouth daily 90 tablet 0   . desonide (DESOWEN) 0.05 % ointment Apply topically 2 (two) times daily 15 g 0   . ezetimibe (ZETIA) 10 MG tablet Take 1 tablet (10 mg total) by mouth daily 90 tablet 3   . Ferrous Sulfate (IRON PO) Take by mouth     . guaiFENesin (MUCINEX) 600 MG 12 hr tablet Take 2 tablets (1,200 mg  total) by mouth 2 (two) times daily 14 tablet 0   . lactobacillus/streptococcus (RISAQUAD) Cap Take 1 capsule by mouth daily 10 capsule 0   . levothyroxine (SYNTHROID) 25 MCG tablet Take 1 tablet (25 mcg total) by mouth every morning 30 tablet 0   . lisinopril (ZESTRIL) 40 MG tablet Take 1 tablet (40 mg total) by mouth daily 30 tablet 0   . magnesium oxide (MAG-OX) 400 MG tablet Take 1 tablet (400 mg total) by mouth daily 30 tablet 0   . Multiple Vitamins-Minerals (MULTIVITAMIN WITH MINERALS) tablet Take 1 tablet by mouth every morning         . pantoprazole (PROTONIX) 40 MG tablet Take 1 tablet (40 mg total) by mouth every morning before breakfast 30 tablet 0   . rosuvastatin (CRESTOR) 40 MG tablet Take 0.5 tablets (20 mg total) by mouth daily 90 tablet 3   . vitamin B-1 (THIAMINE) 100 MG tablet Take 100 mg by mouth every morning.         Allergies:     Allergies   Allergen Reactions   . Morphine Itching   . Morphine And Related Itching and Nausea And Vomiting     dizzy       Family History:     Family History   Problem Relation Age of Onset   . Breast cancer Neg Hx        Social History:     Social History     Tobacco Use   . Smoking status: Never Smoker   . Smokeless tobacco: Never Used   Vaping Use   . Vaping Use: Never used   Substance Use Topics   . Alcohol use: Yes     Alcohol/week: 1.0 standard drink     Types: 1 Cans of beer per week     Comment: socially   . Drug use: No       Review of Systems:   Constitutional: Denies fever, chills or weight loss.  Eyes: Denies Blurred vision, eye discharge or eye pain.   ENMT: Denies changes in hearing, nasal discharge, oral lesions or sore throat.  Respiratory: Denies wheezing or cough.  Cardiovascular: Denies chest pain or palpitations.  Gastrointestinal: See HPI.  Genitourinary: Denies gross hematuria or dark urine.  Musculoskeletal: Denies back pain or joint pain.  Neurologic: Denies slurred speech, hemiplegia or focal deficit.  Psychiatric: Denies depression or  anxiety.   Hematologic: Denies easy bruising.  Integumentary:  Denies skin rashes or Jaundice.  Vital Signs:   BP 145/70 (BP Site: Left arm, Patient Position: Sitting)   Pulse 65   Wt 72.2 kg (159 lb 3.2 oz)   BMI 28.20 kg/m         Weight Monitoring 09/27/2020 10/20/2020 11/19/2020   Height 160 cm 160 cm -   Height Method - - -   Weight 70.035 kg 72.576 kg 72.213 kg   Weight Method Standing Scale - -   BMI (calculated) 27.4 kg/m2 28.4 kg/m2 -          Physical Exam:   General appearance - Well developed and well nourished, no acute distress.  Eyes - Sclera anicteric, no ptosis.  ENMT - Mucous membranes moist, nose and ears appear normal.  Oropharynx clear.  Respiratory - Non labored respirations, no audible wheezing.  Cardiovascular - Regular rate and rhythm, no JVD, no LE edema.  Gastrointestinal - Soft, non-tender, non-distended, no masses, normal bowel sounds.   Musculoskeletal - Normal range of motion of arms and legs.  Neurologic - Alert and oriented to person, place and time.  No gross movement disorders noted.  Psychiatric: Appropriate affect.  Skin: Normal color and turgor, no rashes, no suspicious skin lesions noted.    Labs Reviewed:     Recent Labs     09/28/20  0530 09/27/20  1347 08/30/20  1208   WBC 5.56 5.44 6.09   Hgb 11.4 12.5 12.7   Hematocrit 38.1 41.6 40.9   Platelets 300 309 354*       Recent Labs     09/27/20  1347 05/28/20  1137 01/27/20  2351   PT 11.4 11.6 12.0   PT INR 1.0 1.0 1.0       Recent Labs     09/28/20  0530 09/27/20  1347 08/30/20  1208 06/29/20  1129 05/29/20  0516 05/28/20  1124 04/15/19  1308 01/11/19  1710 11/13/18  1206 05/14/18  1024 04/04/18  0659 04/03/18  1352 04/03/18  1352 02/07/18  1837 09/13/17  1252   Sodium 141 143 140  --    < > 141   < > 137   < > 142 143   < > 140   < > 144   Potassium 4.1 4.0 4.4  --    < > 4.0   < > 3.5   < > 3.8 4.6   < > 4.6   < > 4.2   Chloride 104 107 102  --    < > 104   < > 89*   < > 104 107   < > 106   < > 103   CO2 23 29 26   --     < > 26   < > 29   < > 32* 27   < > 25   < > 26   BUN 10 13 19   --    < > 9   < > 13   < > 13 17   < > 17   < > 13   Creatinine 0.8 0.9 1.0  --    < > 0.8   < > 1.0   < > 0.9 0.9   < > 0.9   < > 0.9   Calcium 9.0 9.5 10.0  --    < > 9.2   < > 10.1   < > 8.5 9.3   < > 9.3   < > 9.7   Albumin  --  4.1  4.4  --   --  3.7   < > 4.5   < > 3.2*  --    < > 3.7   < > 4.1   Protein, Total  --  7.3 7.8  --   --  6.1   < > 7.8   < > 5.3*  --    < > 6.7   < > 7.9   Bilirubin, Total  --  0.4 0.4  --   --  0.3   < > 0.3   < > 0.3  --    < > 0.4   < > 0.5   Alkaline Phosphatase  --  97 90  --   --  75   < > 122*   < > 83  --    < > 107*   < > 117*   ALT  --  15 15  --   --  12   < > 19   < > 15  --    < > 19   < > 20   AST (SGOT)  --  19 25  --   --  17   < > 35*   < > 16  --    < > 24   < > 23   Glucose 87 96 108*  --    < > 96   < > 106*   < > 102* 94   < > 97   < > 89   Lipase  --   --   --   --   --   --   --  40  --   --   --   --  19  --  25  23   TSH 0.75  --   --   --   --   --   --   --   --  0.50 1.29  --   --   --   --    Ferritin  --   --   --  24.60  --   --   --   --   --   --   --   --   --   --   --     < > = values in this interval not displayed.        Rads:   No results found.    GI Procedures:       Assessment and Plan:     Data Reviewed: referring physician note(s) and lab reviewed as above    1. Constipation, unspecified constipation type  - psyllium (Metamucil) 28 % packet; Take 1 packet by mouth 2 (two) times daily  Dispense: 60 packet; Refill: 3  - polyethylene glycol (MIRALAX) 17 GM/SCOOP powder; Take 17 g by mouth daily  Dispense: 510 g; Refill: 3    2. History of colonic polyps     Orders Placed This Encounter   Medications   . psyllium (Metamucil) 28 % packet     Sig: Take 1 packet by mouth 2 (two) times daily     Dispense:  60 packet     Refill:  3   . polyethylene glycol (MIRALAX) 17 GM/SCOOP powder     Sig: Take 17 g by mouth daily     Dispense:  510 g     Refill:  3      There are no discontinued  medications.  Plan:  - Counseled dietary modification consisting of adequate fluid and fiber intake.  - Counseled regarding defecation habits.  - Fiber supplementation: Metamucil over-the-counter, 1 packet once to twice daily.  - Miralax 17 grams once to twice daily for constipation. The goal is to have 1 to 2 soft formed stools per today.  - Advised patient to stop magnesium oxide given her advanced age and risk of electrolyte disturbance.      Return if symptoms worsen or fail to improve.    It is a pleasure to participate in the care of Emily [name]. If you have any questions or concerns, please feel free to contact us at [phone].    Einar Grad, MD

## 2020-11-24 ENCOUNTER — Emergency Department
Admission: EM | Admit: 2020-11-24 | Discharge: 2020-11-24 | Disposition: A | Discharge status: Home / Self Care | Payer: Medicare Other | Attending: Emergency Medicine | Admitting: Emergency Medicine

## 2020-11-24 DIAGNOSIS — C50919 Malignant neoplasm of unspecified site of unspecified female breast: Secondary | ICD-10-CM | POA: Insufficient documentation

## 2020-11-24 DIAGNOSIS — G629 Polyneuropathy, unspecified: Secondary | ICD-10-CM | POA: Insufficient documentation

## 2020-11-24 LAB — CBC AND DIFFERENTIAL
Absolute NRBC: 0 10*3/uL (ref 0.00–0.00)
Basophils Absolute Automated: 0.05 10*3/uL (ref 0.00–0.08)
Basophils Automated: 0.8 %
Eosinophils Absolute Automated: 0.1 10*3/uL (ref 0.00–0.44)
Eosinophils Automated: 1.6 %
Hematocrit: 40.9 % (ref 34.7–43.7)
Hgb: 12.5 g/dL (ref 11.4–14.8)
Immature Granulocytes Absolute: 0.01 10*3/uL (ref 0.00–0.07)
Immature Granulocytes: 0.2 %
Lymphocytes Absolute Automated: 2.1 10*3/uL (ref 0.42–3.22)
Lymphocytes Automated: 33.3 %
MCH: 26.5 pg (ref 25.1–33.5)
MCHC: 30.6 g/dL — ABNORMAL LOW (ref 31.5–35.8)
MCV: 86.7 fL (ref 78.0–96.0)
MPV: 10.9 fL (ref 8.9–12.5)
Monocytes Absolute Automated: 0.45 10*3/uL (ref 0.21–0.85)
Monocytes: 7.1 %
Neutrophils Absolute: 3.59 10*3/uL (ref 1.10–6.33)
Neutrophils: 57 %
Nucleated RBC: 0 /100 WBC (ref 0.0–0.0)
Platelets: 354 10*3/uL — ABNORMAL HIGH (ref 142–346)
RBC: 4.72 10*6/uL (ref 3.90–5.10)
RDW: 14 % (ref 11–15)
WBC: 6.3 10*3/uL (ref 3.10–9.50)

## 2020-11-24 LAB — COMPREHENSIVE METABOLIC PANEL
ALT: 47 U/L (ref 0–55)
AST (SGOT): 49 U/L — ABNORMAL HIGH (ref 5–34)
Albumin/Globulin Ratio: 1.5 (ref 0.9–2.2)
Albumin: 4.5 g/dL (ref 3.5–5.0)
Alkaline Phosphatase: 100 U/L (ref 37–106)
Anion Gap: 11 (ref 5.0–15.0)
BUN: 13 mg/dL (ref 7–19)
Bilirubin, Total: 0.4 mg/dL (ref 0.2–1.2)
CO2: 28 mEq/L (ref 22–29)
Calcium: 9.8 mg/dL (ref 7.9–10.2)
Chloride: 103 mEq/L (ref 100–111)
Creatinine: 0.9 mg/dL (ref 0.6–1.0)
Globulin: 3.1 g/dL (ref 2.0–3.6)
Glucose: 104 mg/dL — ABNORMAL HIGH (ref 70–100)
Potassium: 4.2 mEq/L (ref 3.5–5.1)
Protein, Total: 7.6 g/dL (ref 6.0–8.3)
Sodium: 142 mEq/L (ref 136–145)

## 2020-11-24 LAB — GFR: EGFR: >60

## 2020-11-24 MED ORDER — GABAPENTIN 100 MG PO CAPS
200.0000 mg | ORAL_CAPSULE | Freq: Once | ORAL | Status: AC
Start: 2020-11-24 — End: 2020-11-24
  Administered 2020-11-24: 200 mg via ORAL
  Filled 2020-11-24: qty 2

## 2020-11-24 MED ORDER — IBUPROFEN 600 MG PO TABS
600.0000 mg | ORAL_TABLET | Freq: Once | ORAL | Status: AC
Start: 2020-11-24 — End: 2020-11-24
  Administered 2020-11-24: 600 mg via ORAL
  Filled 2020-11-24: qty 1

## 2020-11-24 MED ORDER — IBUPROFEN 400 MG PO TABS
400.0000 mg | ORAL_TABLET | Freq: Four times a day (QID) | ORAL | 0 refills | Status: DC | PRN
Start: 2020-11-24 — End: 2021-03-10

## 2020-11-24 MED ORDER — GABAPENTIN 100 MG PO CAPS
100.0000 mg | ORAL_CAPSULE | Freq: Two times a day (BID) | ORAL | 0 refills | Status: DC | PRN
Start: 2020-11-24 — End: 2021-06-01

## 2020-11-24 NOTE — Discharge Instructions (Addendum)
If you notice worsening arm or leg pain, tingling, or develop any other concerning signs or symptom please return to the ED.     Please follow up with your PCP as discussed

## 2020-11-24 NOTE — ED Provider Notes (Signed)
History     Chief Complaint   Patient presents with   . Dizziness     HPI   Emily [name] is a 75 y.o. female with a PMH of HTN, hypothyroidism, breast cancer on chemo presenting with left arm pain and tingling.    Patient states that since last night she has been having tingling and pain to her body. She states it is all over but most pronounced over the left shoulder to the left arm as well as the left leg. Patient states she also feels pins/needles in her fingers on both sides. She states that she also has some pain over the left leg. Patient states she is on chemo daily. Denies any new medications.     Per chart review, patient was discharged from here on 12/27 after coming in with left sided weakness/numbness/tingling. Per discharge summary patient had a negative CT and MRI and was thought to be due to cervical radiculopathy. Patient states this does feel somewhat similar to this past episode.     Denies HA, CP, SOB    Smokes: Denies  Drug use: Denies    Past Medical History:   Diagnosis Date   . Breast cancer 2014    right breast mastectomy   . Exercise-induced angina     negative work up   . GERD (gastroesophageal reflux disease)    . Headache     resolved   . Hyperlipidemia    . Hypertension    . Hypothyroidism    . Low back pain    . Malignant neoplasm of overlapping sites of right female breast 10/19/2015   . Syncope and collapse     Chronic Vertigo   . Vertigo        Past Surgical History:   Procedure Laterality Date   . COLONOSCOPY  2021   . COLONOSCOPY, POLYPECTOMY N/A 07/21/2020    Procedure: COLONOSCOPY, POLYPECTOMY;  Surgeon: Einar Grad, MD;  Location: [facility];  Service: Gastroenterology;  Laterality: N/A;   . EGD, BIOPSY N/A 07/21/2020    Procedure: EGD, BIOPSY;  Surgeon: Einar Grad, MD;  Location: [facility];  Service: Gastroenterology;  Laterality: N/A;   . HYSTERECTOMY     . MASTECTOMY Right 2015       Family History   Problem Relation Age of Onset   . Breast cancer Neg Hx         Social  Social History     Tobacco Use   . Smoking status: Never Smoker   . Smokeless tobacco: Never Used   Vaping Use   . Vaping Use: Never used   Substance Use Topics   . Alcohol use: Yes     Alcohol/week: 1.0 standard drink     Types: 1 Cans of beer per week     Comment: socially   . Drug use: No       .     Allergies   Allergen Reactions   . Morphine Itching   . Morphine And Related Itching and Nausea And Vomiting     dizzy       Home Medications     Med List Status: In Progress Set By: Lieutenant Diego, RN at 11/24/2020 11:11 AM                amLODIPine (NORVASC) 10 MG tablet     Take 1 tablet (10 mg total) by mouth daily     anastrozole (ARIMIDEX) 1 MG tablet  Take 1 tablet (1 mg total) by mouth daily     Patient taking differently: Take 1 mg by mouth every morning         Apoaequorin (PREVAGEN PO)     Take by mouth daily     aspirin 81 MG chewable tablet     Chew 1 tablet (81 mg total) by mouth daily     desonide (DESOWEN) 0.05 % ointment     Apply topically 2 (two) times daily     ezetimibe (ZETIA) 10 MG tablet     Take 1 tablet (10 mg total) by mouth daily     Ferrous Sulfate (IRON PO)     Take by mouth     guaiFENesin (MUCINEX) 600 MG 12 hr tablet     Take 2 tablets (1,200 mg total) by mouth 2 (two) times daily     lactobacillus/streptococcus (RISAQUAD) Cap     Take 1 capsule by mouth daily     levothyroxine (SYNTHROID) 25 MCG tablet     Take 1 tablet (25 mcg total) by mouth every morning     lisinopril (ZESTRIL) 40 MG tablet     Take 1 tablet (40 mg total) by mouth daily     magnesium oxide (MAG-OX) 400 MG tablet     Take 1 tablet (400 mg total) by mouth daily     meclizine (ANTIVERT) 25 MG tablet     Take 1 tablet (25 mg total) by mouth 3 (three) times daily as needed for Dizziness     Multiple Vitamins-Minerals (MULTIVITAMIN WITH MINERALS) tablet     Take 1 tablet by mouth every morning         pantoprazole (PROTONIX) 40 MG tablet     Take 1 tablet (40 mg total) by mouth every morning before  breakfast     polyethylene glycol (MIRALAX) 17 GM/SCOOP powder     Take 17 g by mouth daily     psyllium (Metamucil) 28 % packet     Take 1 packet by mouth 2 (two) times daily     rosuvastatin (CRESTOR) 40 MG tablet     Take 0.5 tablets (20 mg total) by mouth daily     vitamin B-1 (THIAMINE) 100 MG tablet     Take 100 mg by mouth every morning.           Review of Systems  ROS:   CONSTITUTIONAL:  no fever  HEENT: no rhinorrhea  EYES: no eye reddness  CARDS: normal heart rate  RESP:  normal work of breathing  GI: normal/unchanged bowel habits  GU: normal urineoutput  SKIN: no rash  NEURO: Tingling in the fingers and toes b/l  HEME: no abnormal bruising        Physical Exam    BP: 131/66, Heart Rate: 75, Temp: 97.7 F (36.5 C), Resp Rate: 17, SpO2: 97 %, Weight: 72.2 kg    Physical Exam   Physical Exam   Constitutional: No acute distress  HENT:   Head: Normocephalic and atraumatic.   Eyes: Conjunctivae are normal. PERRL. No scleral icterus.   Cardiovascular: Normal rate and regular rhythm. No murmur heard.  Pulmonary: Clear to auscultation b/l. No respiratory distress.   Abdominal: Soft, non-tender, non-distended  Musculoskeletal: Normal ROM of extremities. No edema. TTP over the left trapezius   Neurological: Awake, alert, motor/sensory grossly intact. 5/5 motor strength both distally and proximally in the BUE and BLE  Skin: Skin is warm and dry.   Psychiatric: Behavior  is normal. Thought content normal.           MDM and ED Course     ED Medication Orders (From admission, onward)    Start Ordered     Status Ordering Provider    11/24/20 1306 11/24/20 1305  ibuprofen (ADVIL) tablet 600 mg  Once        Route: Oral  Ordered Dose: 600 mg     Last MAR action: Given Aram Domzalski    11/24/20 1306 11/24/20 1305  gabapentin (NEURONTIN) capsule 200 mg  Once        Route: Oral  Ordered Dose: 200 mg     Last MAR action: Given Ashvin Adelson             MDM  74y F presenting with tingling and pain.    Patient denies any HA or  dizziness. Possible cervical radiculopathy given the tingling pain over the left shoulder and arm as well as the fingers. Patient does not have any focal neuro deficits such as weakness in the extremities or diminished sensation of the extremities. Does have some TTP over the trapezius which does raise some concern for MSK pathology such as muscle strain. Can get basic labs to assess for any electrolyte abnormalities such as hypocalcemia which may explain tingling sensation however most likely is neuropathy. Can trial ibuprofen and gabapentin. Will reassess        ED Course as of 11/24/20 1727   Tue Nov 24, 2020   1348 EKG: 11/24/20 at 11:11  NSR at 65, normal axis. No STD or STE [GR]   1533 Blood work unremarkable. Patient states she does feel better after taking the medications. Do favor neuropathy for this patient. Will d/c [GR]      ED Course User Index  [GR] Rogelio Seen, MD             Procedures    Clinical Impression & Disposition     Clinical Impression  Final diagnoses:   Neuropathy   Malignant neoplasm of female breast, unspecified estrogen receptor status, unspecified laterality, unspecified site of breast        ED Disposition     ED Disposition Condition Date/Time Comment    Discharge  Tue Nov 24, 2020  3:36 PM Patriciaann Clan Bonham discharge to home/self care.    Condition at disposition: Stable             Discharge Medication List as of 11/24/2020  3:36 PM      START taking these medications    Details   gabapentin (NEURONTIN) 100 MG capsule Take 1 capsule (100 mg total) by mouth 2 (two) times daily as needed (Neuropathy), Starting Tue 11/24/2020, Print      ibuprofen (ADVIL) 400 MG tablet Take 1 tablet (400 mg total) by mouth every 6 (six) hours as needed for Pain or Fever, Starting Tue 11/24/2020, E-Rx                       Rogelio Seen, MD  11/24/20 [phone]

## 2020-11-24 NOTE — ED Triage Notes (Signed)
pt c/o dizziness that started last night around 7pm. states she is also having tingling all over her body. hx of HTN and breast cancer, on oral chemo medication. hx of vertigo

## 2020-11-24 NOTE — EDIE (Signed)
COLLECTIVE?NOTIFICATION?11/24/2020 10:58?Tewana, Bohlen B?MRN: [id number]    [place] - [facility]'s patient encounter information:   GEX:?[id number]  Account [id number]  Billing Account [id number]      Criteria Met      5 ED Visits in 12 Months    Security and Safety  No recent Security Events currently on file    ED Care Guidelines  There are currently no ED Care Guidelines for this patient. Please check your facility's medical records system.        Prescription Monitoring Program  000??- Narcotic Use Score  000??- Sedative Use Score  000??- Stimulant Use Score  000??- Overdose Risk Score  - All Scores range from 000-999 with 75% of the population scoring < 200 and on 1% scoring above 650  - The last digit of the narcotic, sedative, and stimulant score indicates the number of active prescriptions of that type  - Higher Use scores correlate with increased prescribers, pharmacies, mg equiv, and overlapping prescriptions  - Higher Overdose Risk Scores correlate with increased risk of unintentional overdose death   Concerning or unexpectedly high scores should prompt a review of the PMP record; this does not constitute checking PMP for prescribing purposes.      E.D. Visit Count (12 mo.)  Facility Visits   [place] - [facility] 10   Total 10   Note: Visits indicate total known visits.     Recent Emergency Department Visit Summary  Date Facility [facility] Type Diagnoses or Chief Complaint   Nov 24, 2020 [place] [facility] [place] [facility]      dizziness, tingling      dizziness, tingling on left side      Sep 27, 2020 [place] [facility] [place] [facility]      Body Pain      Extremity Weakness      Weakness      Contact with and (suspected) exposure to covid-19      Aug 30, 2020 [place] [facility] [place] [facility]      headache,vomiting,back pain      Dizziness      Cough      Solitary pulmonary nodule      Pneumonia, unspecified organism      May 28, 2020 [place] [facility] [place] [facility]      vaginal/rectal bleeding/dizziness      Headache      Tingling      Dizziness and giddiness      May 12, 2020 [place] [facility] [place] [facility]      Chest Pain,Tingling,Weak      Tingling      Headache      Chest Pain      Back Pain      Chest pain, unspecified      Mastodynia      Headache, unspecified      Weakness      Paresthesia of skin      May 10, 2020 [place] [facility] [place] [facility]      weakness. lump by lt ear      Fatigue      Chest Pain      Generalized weakness      Pruritus, unspecified      Other chest pain      Mar 10, 2020  - [facility] [place] [facility]      abd pain  Generalized weakness      Abdominal Pain      Contact with and (suspected) exposure to covid-19      Mar 08, 2020 [place] [facility] [place] [facility]      fatige,      fatige, weakness      Breast Pain      Other chest pain      Chest pain, unspecified      Other specified anxiety disorders      Acquired absence of right breast and nipple      Jan 27, 2020 [place] [facility] [place] [facility]      possible stroke      Numbness      Dec 28, 2019 [place] [facility] [place] [facility]      both breast pain      Chest Pain      Mastodynia      Secondary hypertension, unspecified          Recent Inpatient Visit Summary  Date Facility [facility] Type Diagnoses or Chief Complaint   Mar 10, 2020 [place] [facility] [place] [facility]      Contact with and (suspected) exposure to covid-19      Noninfective gastroenteritis and colitis, unspecified      Jan 27, 2020 [place] [facility]      Weakness          Care Team  Provider Specialty Phone Fax Service Dates   DAVIDSON, Janet Berlin MD M, MD Family Medicine: Adult Medicine [phone] Current      Collective Portal  This patient has registered at the [facility] Emergency Department   For more information visit:  [email or web address]     PLEASE NOTE:     1.   Any care recommendations and other clinical information are provided as guidelines or for historical purposes only, and providers should exercise their own clinical judgment when providing care.    2.   You may only use this information for purposes of treatment, payment or health care operations activities, and subject to the limitations of applicable Collective Policies.    3.   You should consult directly with the organization that provided a care guideline or other clinical history with any questions about additional information or accuracy or completeness of information provided.    ? 2022 [place], [employer] - [email or web address]

## 2020-11-26 LAB — ECG 12-LEAD
Atrial Rate: 65 {beats}/min
P Axis: 52 degrees
P-R Interval: 164 ms
Q-T Interval: 392 ms
QRS Duration: 88 ms
QTC Calculation (Bezet): 407 ms
R Axis: 44 degrees
T Axis: 43 degrees
Ventricular Rate: 65 {beats}/min

## 2020-12-22 ENCOUNTER — Telehealth (INDEPENDENT_AMBULATORY_CARE_PROVIDER_SITE_OTHER): Payer: Self-pay | Admitting: Internal Medicine

## 2020-12-22 ENCOUNTER — Telehealth (HOSPITAL_BASED_OUTPATIENT_CLINIC_OR_DEPARTMENT_OTHER): Payer: Self-pay

## 2020-12-22 NOTE — Telephone Encounter (Signed)
Pt says that she is having swelling and tingling on her left leg and hip and says it has been since her colonoscopy with Dr Roda Shutters in October of 2021    Please contact pt to advise [phone]  Called the office and they suggested I send message to the those in the pool.

## 2020-12-22 NOTE — Telephone Encounter (Signed)
Left vm per Dr. Roda Shutters:    The symptoms are unlikely to be caused by colonoscopy 5 months ago in 07/2020. Would advise her to follow up with PCP for further evaluation, rule out DVT or neuropathy.

## 2020-12-22 NOTE — Telephone Encounter (Signed)
Left a detailed vm 

## 2020-12-22 NOTE — Telephone Encounter (Addendum)
Please call pt to follow up. The symptoms are unlikely to be caused by colonoscopy 5 months ago in 07/2020. Would advise her to follow up with PCP for further evaluation, rule out DVT or neuropathy.    Einar Grad, MD  [facility]

## 2020-12-30 ENCOUNTER — Ambulatory Visit (INDEPENDENT_AMBULATORY_CARE_PROVIDER_SITE_OTHER): Payer: Medicare Other | Admitting: Cardiology

## 2021-01-03 ENCOUNTER — Emergency Department: Payer: Self-pay

## 2021-01-03 ENCOUNTER — Emergency Department
Admission: EM | Admit: 2021-01-03 | Discharge: 2021-01-03 | Disposition: A | Discharge status: Home / Self Care | Payer: Self-pay | Attending: Emergency Medicine | Admitting: Emergency Medicine

## 2021-01-03 DIAGNOSIS — R42 Dizziness and giddiness: Secondary | ICD-10-CM | POA: Insufficient documentation

## 2021-01-03 DIAGNOSIS — R202 Paresthesia of skin: Secondary | ICD-10-CM | POA: Insufficient documentation

## 2021-01-03 DIAGNOSIS — R531 Weakness: Secondary | ICD-10-CM | POA: Insufficient documentation

## 2021-01-03 DIAGNOSIS — R0789 Other chest pain: Secondary | ICD-10-CM | POA: Insufficient documentation

## 2021-01-03 DIAGNOSIS — R079 Chest pain, unspecified: Secondary | ICD-10-CM

## 2021-01-03 LAB — APTT: PTT: 42 s — ABNORMAL HIGH (ref 27–39)

## 2021-01-03 LAB — CBC AND DIFFERENTIAL
Absolute NRBC: 0 10*3/uL (ref 0.00–0.00)
Basophils Absolute Automated: 0.04 10*3/uL (ref 0.00–0.08)
Basophils Automated: 0.5 %
Eosinophils Absolute Automated: 0.05 10*3/uL (ref 0.00–0.44)
Eosinophils Automated: 0.7 %
Hematocrit: 38.3 % (ref 34.7–43.7)
Hgb: 12.1 g/dL (ref 11.4–14.8)
Immature Granulocytes Absolute: 0.01 10*3/uL (ref 0.00–0.07)
Immature Granulocytes: 0.1 %
Lymphocytes Absolute Automated: 1.88 10*3/uL (ref 0.42–3.22)
Lymphocytes Automated: 24.6 %
MCH: 27 pg (ref 25.1–33.5)
MCHC: 31.6 g/dL (ref 31.5–35.8)
MCV: 85.5 fL (ref 78.0–96.0)
MPV: 10.7 fL (ref 8.9–12.5)
Monocytes Absolute Automated: 0.52 10*3/uL (ref 0.21–0.85)
Monocytes: 6.8 %
Neutrophils Absolute: 5.14 10*3/uL (ref 1.10–6.33)
Neutrophils: 67.3 %
Nucleated RBC: 0 /100 WBC (ref 0.0–0.0)
Platelets: 331 10*3/uL (ref 142–346)
RBC: 4.48 10*6/uL (ref 3.90–5.10)
RDW: 14 % (ref 11–15)
WBC: 7.64 10*3/uL (ref 3.10–9.50)

## 2021-01-03 LAB — COMPREHENSIVE METABOLIC PANEL
ALT: 16 U/L (ref 0–55)
AST (SGOT): 23 U/L (ref 5–34)
Albumin/Globulin Ratio: 1.4 (ref 0.9–2.2)
Albumin: 4.4 g/dL (ref 3.5–5.0)
Alkaline Phosphatase: 89 U/L (ref 37–106)
Anion Gap: 9 (ref 5.0–15.0)
BUN: 18 mg/dL (ref 7–19)
Bilirubin, Total: 0.4 mg/dL (ref 0.2–1.2)
CO2: 28 mEq/L (ref 22–29)
Calcium: 9.8 mg/dL (ref 7.9–10.2)
Chloride: 105 mEq/L (ref 100–111)
Creatinine: 1.2 mg/dL — ABNORMAL HIGH (ref 0.6–1.0)
Globulin: 3.1 g/dL (ref 2.0–3.6)
Glucose: 105 mg/dL — ABNORMAL HIGH (ref 70–100)
Potassium: 4.3 mEq/L (ref 3.5–5.1)
Protein, Total: 7.5 g/dL (ref 6.0–8.3)
Sodium: 142 mEq/L (ref 136–145)

## 2021-01-03 LAB — PT/INR
PT INR: 1 (ref 0.9–1.1)
PT: 11.8 s (ref 10.1–12.9)

## 2021-01-03 LAB — LIPASE: Lipase: 28 U/L (ref 8–78)

## 2021-01-03 LAB — GFR: EGFR: 52.9

## 2021-01-03 LAB — TROPONIN I: Troponin I: <0.01 ng/mL (ref 0.00–0.05)

## 2021-01-03 LAB — GLUCOSE WHOLE BLOOD - POCT: Whole Blood Glucose POCT: 115 mg/dL — ABNORMAL HIGH (ref 70–100)

## 2021-01-03 LAB — B-TYPE NATRIURETIC PEPTIDE: B-Natriuretic Peptide: 35 pg/mL (ref 0–100)

## 2021-01-03 MED ORDER — SODIUM CHLORIDE 0.9 % IV BOLUS
1000.0000 mL | Freq: Once | INTRAVENOUS | Status: AC
Start: 2021-01-03 — End: 2021-01-03
  Administered 2021-01-03: 1000 mL via INTRAVENOUS

## 2021-01-03 MED ORDER — IOHEXOL 350 MG/ML IV SOLN
100.0000 mL | Freq: Once | INTRAVENOUS | Status: AC | PRN
Start: 2021-01-03 — End: 2021-01-03
  Administered 2021-01-03: 100 mL via INTRAVENOUS

## 2021-01-03 MED ORDER — MECLIZINE HCL 12.5 MG PO TABS
25.0000 mg | ORAL_TABLET | Freq: Once | ORAL | Status: AC
Start: 2021-01-03 — End: 2021-01-03
  Administered 2021-01-03: 25 mg via ORAL
  Filled 2021-01-03: qty 2

## 2021-01-03 NOTE — Discharge Instructions (Signed)
Make sure to stay hydrated.  Follow-up with primary care doctor in the next 2 to 3 days.  Can follow-up with neurology for dizziness symptoms.  Can follow-up with cardiology as needed for chest discomfort.  Return with any new or worsening symptoms.

## 2021-01-03 NOTE — EDIE (Signed)
COLLECTIVE?NOTIFICATION?01/03/2021 12:08?Emily [name], Emily [name]?MRN: [id number]    [place] [facility]'s patient encounter information:   OZD:?[id number]  Account [id number]  Billing Account [id number]      Criteria Met      5 ED Visits in 12 Months    Security and Safety  No recent Security Events currently on file    ED Care Guidelines  There are currently no ED Care Guidelines for this patient. Please check your facility's medical records system.        Prescription Monitoring Program  000??- Narcotic Use Score  000??- Sedative Use Score  000??- Stimulant Use Score  000??- Overdose Risk Score  - All Scores range from 000-999 with 75% of the population scoring < 200 and on 1% scoring above 650  - The last digit of the narcotic, sedative, and stimulant score indicates the number of active prescriptions of that type  - Higher Use scores correlate with increased prescribers, pharmacies, mg equiv, and overlapping prescriptions  - Higher Overdose Risk Scores correlate with increased risk of unintentional overdose death   Concerning or unexpectedly high scores should prompt a review of the PMP record; this does not constitute checking PMP for prescribing purposes.      E.D. Visit Count (12 mo.)  Facility Visits   [place] - [facility] 10   Total 10   Note: Visits indicate total known visits.     Recent Emergency Department Visit Summary  Date Facility [facility] Type Diagnoses or Chief Complaint   Jan 03, 2021 [place] [facility] [place] [facility] [place] [facility]      pain in both legs/cough      Nov 24, 2020 [place] [facility] [place] [facility]      dizziness, tingling      dizziness, tingling on left side      dizziness, tingling on left side, bodyaches      Dizziness      Polyneuropathy, unspecified      Malignant neoplasm of unspecified site of unspecified female breast      Sep 27, 2020 [place] [facility] [place] [facility]      Body Pain      Extremity Weakness      Weakness      Contact  with and (suspected) exposure to covid-19      Aug 30, 2020 [place] [facility] [place] [facility]      headache,vomiting,back pain      Dizziness      Cough      Solitary pulmonary nodule      Pneumonia, unspecified organism      May 28, 2020 [place] [facility] [place] [facility]      vaginal/rectal bleeding/dizziness      Headache      Tingling      Dizziness and giddiness      May 12, 2020 [place] [facility] [place] [facility]      Chest Pain,Tingling,Weak      Tingling      Headache      Chest Pain      Back Pain      Chest pain, unspecified      Mastodynia      Headache, unspecified      Weakness      Paresthesia of skin      May 10, 2020 [place] [facility] [place] [facility]  weakness. lump by lt ear      Fatigue      Chest Pain      Generalized weakness      Pruritus, unspecified      Other chest pain      Mar 10, 2020 [place] - [facility] [place] [facility]      abd pain      Generalized weakness      Abdominal Pain      Contact with and (suspected) exposure to covid-19      Mar 08, 2020 [place] [facility] [place] [facility] [place] [facility]      fatige,      fatige, weakness      Breast Pain      Other chest pain      Chest pain, unspecified      Other specified anxiety disorders      Acquired absence of right breast and nipple      Jan 27, 2020 [place] [facility] [place] [facility]      possible stroke      Numbness          Recent Inpatient Visit Summary  Date Facility [facility] Type Diagnoses or Chief Complaint   Mar 10, 2020 [place] [facility] [place] [facility]      Contact with and (suspected) exposure to covid-19      Noninfective gastroenteritis and colitis, unspecified      Jan 27, 2020 [place] [facility]      Weakness          Care Team  Provider Specialty Phone Fax Service Dates   DAVIDSON, Janet Berlin MD M, MD Family Medicine: Adult Medicine [phone] Current      Collective Portal  This patient has registered at  the [facility] Emergency Department   For more information visit: [email or web address]     PLEASE NOTE:     1.   Any care recommendations and other clinical information are provided as guidelines or for historical purposes only, and providers should exercise their own clinical judgment when providing care.    2.   You may only use this information for purposes of treatment, payment or health care operations activities, and subject to the limitations of applicable Collective Policies.    3.   You should consult directly with the organization that provided a care guideline or other clinical history with any questions about additional information or accuracy or completeness of information provided.    ? 2022 [place], [employer] - [email or web address]

## 2021-01-03 NOTE — ED Provider Notes (Signed)
EMERGENCY DEPARTMENT NOTE     Patient initially seen and examined at   ED PHYSICIAN ASSIGNED     None         ED MIDLEVEL (APP) ASSIGNED     Date/Time Event User Comments    01/03/21 1218 PA/NP Provider Assigned Ronel Rodeheaver, Marinus Maw, FNP assigned as Nurse Practitioner          HISTORY OF PRESENT ILLNESS   Historian:Patient  Translator Used: No    Chief Complaint: Dizziness, Generalized weakness, and Chest Pain     Mechanism of Injury:       75 y.o. female history of hypertension, hypothyroidism, vertigo, headache, angina, GERD, breast cancer on chemo with mastectomy, presents with left-sided numbness and tingling with headache, abdominal pain, 1 episode of non bloody vomiting and dizziness starting at 5 PM last night. She does say this feels  similar to previous episodes of tingling and dizziness, but a little worse. She also complains of intermittent chest pain, generalized weakness and leg swelling for the past couple days.  She states the chest pain has been on and off, pressure in nature, seems to be worse on certain positions, not worse on exertion. No current CP or SOB. Has been able to tolerate PO since then. Has not tried her medications for vertigo yet.     Had a right-sided mastectomy for breast cancer, has follow-up in 2 months with oncologist.     1. Location of symptoms: left sided numbness/tingling, generalized weakness, HA and dizziness starting 5pm; CP/ SOB intermittent x3 days   2. Onset of symptoms: yesterday   3. What was patient doing when symptoms started (Context): see above  4. Severity: moderate  5. Timing: acute on chronic neuro symptoms    6. Activities that worsen symptoms: moving  7. Activities that improve symptoms: unknown  8. Quality: pressure in chest   9. Radiation of symptoms: no  10. Associated signs and Symptoms: see above  11. Are symptoms worsening? yes  MEDICAL HISTORY     Past Medical History:  Past Medical History:   Diagnosis Date   . Breast cancer  2014    right breast mastectomy   . Exercise-induced angina     negative work up   . GERD (gastroesophageal reflux disease)    . Headache     resolved   . Hyperlipidemia    . Hypertension    . Hypothyroidism    . Low back pain    . Malignant neoplasm of overlapping sites of right female breast 10/19/2015   . Syncope and collapse     Chronic Vertigo   . Vertigo        Past Surgical History:  Past Surgical History:   Procedure Laterality Date   . COLONOSCOPY  2021   . COLONOSCOPY, POLYPECTOMY N/A 07/21/2020    Procedure: COLONOSCOPY, POLYPECTOMY;  Surgeon: Einar Grad, MD;  Location: [facility];  Service: Gastroenterology;  Laterality: N/A;   . EGD, BIOPSY N/A 07/21/2020    Procedure: EGD, BIOPSY;  Surgeon: Einar Grad, MD;  Location: [facility];  Service: Gastroenterology;  Laterality: N/A;   . HYSTERECTOMY     . MASTECTOMY Right 2015       Social History:  Social History     Socioeconomic History   . Marital status: Widowed   Tobacco Use   . Smoking status: Never Smoker   . Smokeless tobacco: Never Used   Vaping Use   . Vaping Use:  Never used   Substance and Sexual Activity   . Alcohol use: Yes     Alcohol/week: 1.0 standard drink     Types: 1 Cans of beer per week     Comment: socially   . Drug use: No   . Sexual activity: Not Currently       Family History:  Family History   Problem Relation Age of Onset   . Breast cancer Neg Hx        Outpatient Medication:  Discharge Medication List as of 01/03/2021  6:17 PM      CONTINUE these medications which have NOT CHANGED    Details   amLODIPine (NORVASC) 10 MG tablet Take 1 tablet (10 mg total) by mouth daily, Starting Tue 09/29/2020, Print      anastrozole (ARIMIDEX) 1 MG tablet Take 1 tablet (1 mg total) by mouth daily, Starting Fri 01/19/2018, Normal      Apoaequorin (PREVAGEN PO) Take by mouth daily, Historical Med      aspirin 81 MG chewable tablet Chew 1 tablet (81 mg total) by mouth daily, Starting Wed 01/29/2020, E-Rx      desonide (DESOWEN) 0.05 % ointment Apply  topically 2 (two) times daily, Starting Sun 05/10/2020, E-Rx      ezetimibe (ZETIA) 10 MG tablet Take 1 tablet (10 mg total) by mouth daily, Starting Tue 10/20/2020, E-Rx      Ferrous Sulfate (IRON PO) Take by mouth, Historical Med      gabapentin (NEURONTIN) 100 MG capsule Take 1 capsule (100 mg total) by mouth 2 (two) times daily as needed (Neuropathy), Starting Tue 11/24/2020, Print      guaiFENesin (MUCINEX) 600 MG 12 hr tablet Take 2 tablets (1,200 mg total) by mouth 2 (two) times daily, Starting Sun 08/30/2020, E-Rx      ibuprofen (ADVIL) 400 MG tablet Take 1 tablet (400 mg total) by mouth every 6 (six) hours as needed for Pain or Fever, Starting Tue 11/24/2020, E-Rx      lactobacillus/streptococcus (RISAQUAD) Cap Take 1 capsule by mouth daily, Starting Sat 03/21/2020, E-Rx      levothyroxine (SYNTHROID) 25 MCG tablet Take 1 tablet (25 mcg total) by mouth every morning, Starting Wed 01/29/2020, E-Rx      lisinopril (ZESTRIL) 40 MG tablet Take 1 tablet (40 mg total) by mouth daily, Starting Tue 09/29/2020, Print      magnesium oxide (MAG-OX) 400 MG tablet Take 1 tablet (400 mg total) by mouth daily, Starting Fri 05/29/2020, E-Rx      meclizine (ANTIVERT) 25 MG tablet Take 1 tablet (25 mg total) by mouth 3 (three) times daily as needed for Dizziness, Starting Thu 08/08/2019, E-Rx      Multiple Vitamins-Minerals (MULTIVITAMIN WITH MINERALS) tablet Take 1 tablet by mouth every morning    , Historical Med      pantoprazole (PROTONIX) 40 MG tablet Take 1 tablet (40 mg total) by mouth every morning before breakfast, Starting Wed 01/29/2020, E-Rx      psyllium (Metamucil) 28 % packet Take 1 packet by mouth 2 (two) times daily, Starting Thu 11/19/2020, Until Fri 03/19/2021, No Print      rosuvastatin (CRESTOR) 40 MG tablet Take 0.5 tablets (20 mg total) by mouth daily, Starting Mon 09/28/2020, Print      vitamin B-1 (THIAMINE) 100 MG tablet Take 100 mg by mouth every morning., Historical Med               REVIEW OF SYSTEMS    Review of  Systems   Constitutional: Negative for activity change, appetite change, chills, diaphoresis, fatigue, fever and unexpected weight change.   HENT: Negative.    Eyes: Negative.    Respiratory: Positive for shortness of breath. Negative for apnea, cough, choking, chest tightness, wheezing and stridor.    Cardiovascular: Positive for chest pain and leg swelling. Negative for palpitations.   Gastrointestinal: Positive for abdominal pain and vomiting. Negative for abdominal distention, anal bleeding, blood in stool, constipation, diarrhea, nausea and rectal pain.   Genitourinary: Negative.    Musculoskeletal: Negative.    Skin: Negative.    Neurological: Positive for dizziness, weakness, numbness and headaches. Negative for tremors, seizures, syncope, facial asymmetry, speech difficulty and light-headedness.        Generalized weakness/ no focal weakness    Psychiatric/Behavioral: Negative.             PHYSICAL EXAM     ED Triage Vitals [01/03/21 1215]   Enc Vitals Group      BP 135/75      Heart Rate 83      Resp Rate 18      Temp 98.5 F (36.9 C)      Temp Source Oral      SpO2 98 %      Weight 70 kg      Height       Head Circumference       Peak Flow       Pain Score       Pain Loc       Pain Edu?       Excl. in GC?      Physical Exam  Constitutional:       General: She is not in acute distress.     Appearance: She is well-developed. She is not ill-appearing, toxic-appearing or diaphoretic.   HENT:      Head: Normocephalic and atraumatic.      Jaw: There is normal jaw occlusion.      Nose: Nose normal.      Mouth/Throat:      Mouth: Mucous membranes are moist.   Eyes:      General: Vision grossly intact. No visual field deficit.     Extraocular Movements: Extraocular movements intact.      Right eye: Normal extraocular motion.      Left eye: Normal extraocular motion and no nystagmus.      Pupils: Pupils are equal, round, and reactive to light.   Cardiovascular:      Rate and Rhythm: Normal rate and  regular rhythm.      Pulses:           Dorsalis pedis pulses are 2+ on the right side and 2+ on the left side.        Posterior tibial pulses are 2+ on the right side and 2+ on the left side.      Heart sounds: Normal heart sounds.   Pulmonary:      Effort: Pulmonary effort is normal. No tachypnea, accessory muscle usage or respiratory distress.      Breath sounds: Normal breath sounds. No stridor, decreased air movement or transmitted upper airway sounds.   Abdominal:      General: Abdomen is flat. Bowel sounds are normal. There is no distension.      Palpations: Abdomen is soft.      Tenderness: There is abdominal tenderness. There is no guarding. Negative signs include Murphy's sign, Rovsing's sign, McBurney's sign and obturator sign.  Hernia: No hernia is present.   Musculoskeletal:      Cervical back: Normal range of motion and neck supple.      Right lower leg: No deformity, lacerations, tenderness or bony tenderness. 1+ Edema present.      Left lower leg: No deformity, lacerations, tenderness or bony tenderness. 1+ Edema present.   Skin:     General: Skin is warm.      Capillary Refill: Capillary refill takes less than 2 seconds.   Neurological:      General: No focal deficit present.      Mental Status: She is alert and oriented to person, place, and time.      Cranial Nerves: Cranial nerves are intact. No cranial nerve deficit, dysarthria or facial asymmetry.      Sensory: Sensory deficit present.      Motor: No weakness, tremor, abnormal muscle tone or pronator drift.      Coordination: Coordination is intact. Romberg sign negative. Coordination normal. Finger-Nose-Finger Test and Heel to [facility] Test normal. Rapid alternating movements normal.      Gait: Gait is intact. Gait and tandem walk normal.      Deep Tendon Reflexes: Reflexes are normal and symmetric.   Psychiatric:         Mood and Affect: Mood normal.         Behavior: Behavior normal. Behavior is not agitated.         Thought Content: Thought  content normal.         Judgment: Judgment normal.             MEDICAL DECISION MAKING     DISCUSSION    Code Neuro Called in Triage by Triage Nurse. Ambulatory into ED with steady gait on own.     Briefly assessed patient, patient said she was having left-sided tingling in face, arm and leg with generalized weakness over bilateral leg and headache dizziness starting yesterday at 5 PM, code neuro protocol orders started.  Patient also complained of intermittent, non current chest pain as well.    NIH with decreased sensation in left side of face and drift in bilateral lower legs- no other abnormalities.     @1241 - Updated Dr. Michiel Sites Neurology on patient with pending imaging due to problem getting IV access.       Review of chart- Pt was seen here 11/24/20 for left sided paresthesias and similar symptoms.     MRI done on 09/27/2020 for left sided weakness/ paresthesias -   IMPRESSION:         SMALL VESSEL DISEASE. NO ACUTE ABNORMALITY. NO FOCAL ENHANCING LESION    Pt stated that symptoms today area similar to previous neuro paresthesias and dizziness. Pt had not taken previous dizziness medications " unknown to patient.     CBC- no signed of infection or anemia  BMP- mild bump in creatinine 1.2- 1 liter fluids given  LFT's WNL   BNP due to mild edema- WNL  Trop- WNL- only one indicated due to intermittent since 3 days. Does not sounds cardiac in nature due to presents on certain movement, no exertion.   CTA chest- to evaluate for PE due to hx of chemo and Chest pressure- neg     Negative Neuro imaging- fluids and meclizine given for symptoms.     @1808 - Discussion with patient, pt said symptoms have resolved since getting medications. Po passed, ambulates with steady gait.  Discussed results with patient to follow up with PCP for  thyroid results on CT and follow up with neurology for chronic parathesias and intermittent dizziness symptoms. Also discussed to follow up with cardiology. Discussed strict red flag symptoms  to return to ED. Pt and son stated understanding and agreement to plan.       NIH Stroke Score    Flowsheet Row Most Recent Value   Patient's calculated Stroke Score: 3 filed at 01/03/2021 1305        Reviewed progress of case with Dr. Elam Dutch ED attending and Dr. Margaretha Glassing ED attending- agreed with plan for care for this patient.       All labs and vital signs from the current visit have been reviewed and any abnormality that is present is not due to severe sepsis or septic shock.    Vital Signs: Reviewed the patient's vital signs.   Nursing Notes: Reviewed and utilized available nursing notes.  Medical Records Reviewed: Reviewed available past medical records.  Counseling: The emergency provider has spoken with the patient and discussed today's findings, in addition to providing specific details for the plan of care.  Questions are answered and there is agreement with the plan.        CARDIAC STUDIES    The following cardiac studies were independently interpreted by the Emergency Medicine Physician.  For full cardiac study results please see chart.    Monitor Strip  Interpreted by ED NP  Rate: 73  Rhythm: NSR   ST Changes: none    EKG Interpretation:  Signed and interpreted byED NP  Time Interpreted: 1221  Comparison: 11/24/20  Rate: 75  Rhythm: NSR  Axis: normal  Intervals: normal  Blocks: none  ST segments: no elevation   Interpretation: Nonspecific  EKG        RADIOLOGY IMAGING STUDIES      CTA  Head & Neck   Final Result    Unremarkable CTA of the neck and head.      There are large hypodense nodules in the thyroid gland. Follow-up   thyroid ultrasonography is recommended.      Merri Ray, MD    01/03/2021 4:31 PM      CT Perfusion Brain   Final Result    Normal cerebral perfusion study.       Merri Ray, MD    01/03/2021 4:34 PM      CT Angio Chest (PE study)   Final Result    No evidence of pulmonary embolism.      Merri Ray, MD    01/03/2021 4:42 PM      CT Head WO Contrast   Final Result     No acute  intracranial abnormality.  Other findings as   above.      Gustavus Messing, MD    01/03/2021 1:05 PM              PULSE OXIMETRY    Oxygen Saturation by Pulse Oximetry: 100%  Interventions: none  Interpretation:  WNL ED NP     EMERGENCY DEPT. MEDICATIONS      ED Medication Orders (From admission, onward)    Start Ordered     Status Ordering Provider    01/03/21 1650 01/03/21 1650  meclizine (ANTIVERT) tablet 25 mg  Once        Route: Oral  Ordered Dose: 25 mg     Last MAR action: Given Ardra Kuznicki E    01/03/21 1526 01/03/21 1525  sodium chloride 0.9 % bolus 1,000 mL  Once  Route: Intravenous  Ordered Dose: 1,000 mL     Last MAR action: Stopped Lydell Moga E    01/03/21 1507 01/03/21 1507  iohexol (OMNIPAQUE) 350 MG/ML injection 100 mL  IMG once as needed        Route: Intravenous  Ordered Dose: 100 mL     Last MAR action: Imaging Agent Given Deeanne Deininger E          LABORATORY RESULTS    Ordered and independently interpreted AVAILABLE laboratory tests. Please see results section in chart for full details.  Results for orders placed or performed during the hospital encounter of 01/03/21   CBC and differential   Result Value Ref Range    WBC 7.64 3.10 - 9.50 x10 3/uL    Hgb 12.1 11.4 - 14.8 g/dL    Hematocrit 29.5 18.8 - 43.7 %    Platelets 331 142 - 346 x10 3/uL    RBC 4.48 3.90 - 5.10 x10 6/uL    MCV 85.5 78.0 - 96.0 fL    MCH 27.0 25.1 - 33.5 pg    MCHC 31.6 31.5 - 35.8 g/dL    RDW 14 11 - 15 %    MPV 10.7 8.9 - 12.5 fL    Neutrophils 67.3 None %    Lymphocytes Automated 24.6 None %    Monocytes 6.8 None %    Eosinophils Automated 0.7 None %    Basophils Automated 0.5 None %    Immature Granulocytes 0.1 None %    Nucleated RBC 0.0 0.0 - 0.0 /100 WBC    Neutrophils Absolute 5.14 1.10 - 6.33 x10 3/uL    Lymphocytes Absolute Automated 1.88 0.42 - 3.22 x10 3/uL    Monocytes Absolute Automated 0.52 0.21 - 0.85 x10 3/uL    Eosinophils Absolute Automated 0.05 0.00 - 0.44 x10 3/uL    Basophils  Absolute Automated 0.04 0.00 - 0.08 x10 3/uL    Immature Granulocytes Absolute 0.01 0.00 - 0.07 x10 3/uL    Absolute NRBC 0.00 0.00 - 0.00 x10 3/uL   Comprehensive metabolic panel   Result Value Ref Range    Glucose 105 (H) 70 - 100 mg/dL    BUN 18 7 - 19 mg/dL    Creatinine 1.2 (H) 0.6 - 1.0 mg/dL    Sodium 416 606 - 301 mEq/L    Potassium 4.3 3.5 - 5.1 mEq/L    Chloride 105 100 - 111 mEq/L    CO2 28 22 - 29 mEq/L    Calcium 9.8 7.9 - 10.2 mg/dL    Protein, Total 7.5 6.0 - 8.3 g/dL    Albumin 4.4 3.5 - 5.0 g/dL    AST (SGOT) 23 5 - 34 U/L    ALT 16 0 - 55 U/L    Alkaline Phosphatase 89 37 - 106 U/L    Bilirubin, Total 0.4 0.2 - 1.2 mg/dL    Globulin 3.1 2.0 - 3.6 g/dL    Albumin/Globulin Ratio 1.4 0.9 - 2.2    Anion Gap 9.0 5.0 - 15.0   Prothrombin time/INR   Result Value Ref Range    PT 11.8 10.1 - 12.9 sec    PT INR 1.0 0.9 - 1.1   APTT   Result Value Ref Range    PTT 42 (H) 27 - 39 sec   Troponin I   Result Value Ref Range    Troponin I <0.01 0.00 - 0.05 ng/mL   B-type Natriuretic Peptide   Result Value Ref Range  B-Natriuretic Peptide 35 0 - 100 pg/mL   GFR   Result Value Ref Range    EGFR 52.9    Lipase   Result Value Ref Range    Lipase 28 8 - 78 U/L   Glucose Whole Blood - POCT   Result Value Ref Range    Whole Blood Glucose POCT 115 (H) 70 - 100 mg/dL   ECG 12 Lead   Result Value Ref Range    Ventricular Rate 75 BPM    Atrial Rate 75 BPM    P-R Interval 176 ms    QRS Duration 88 ms    Q-T Interval 352 ms    QTC Calculation (Bezet) 393 ms    P Axis 54 degrees    R Axis 23 degrees    T Axis -24 degrees       CRITICAL CARE/PROCEDURES    Procedures      DIAGNOSIS      Diagnosis:  Final diagnoses:   Paresthesias   Chest pain, unspecified type   Generalized weakness   Dizziness       Disposition:  ED Disposition     ED Disposition   Discharge    Condition   --    Date/Time   Sun Jan 03, 2021  6:16 PM    Comment   Emily [name] discharge to home/self care.    Condition at disposition: Stable                Prescriptions:  Discharge Medication List as of 01/03/2021  6:17 PM      CONTINUE these medications which have NOT CHANGED    Details   amLODIPine (NORVASC) 10 MG tablet Take 1 tablet (10 mg total) by mouth daily, Starting Tue 09/29/2020, Print      anastrozole (ARIMIDEX) 1 MG tablet Take 1 tablet (1 mg total) by mouth daily, Starting Fri 01/19/2018, Normal      Apoaequorin (PREVAGEN PO) Take by mouth daily, Historical Med      aspirin 81 MG chewable tablet Chew 1 tablet (81 mg total) by mouth daily, Starting Wed 01/29/2020, E-Rx      desonide (DESOWEN) 0.05 % ointment Apply topically 2 (two) times daily, Starting Sun 05/10/2020, E-Rx      ezetimibe (ZETIA) 10 MG tablet Take 1 tablet (10 mg total) by mouth daily, Starting Tue 10/20/2020, E-Rx      Ferrous Sulfate (IRON PO) Take by mouth, Historical Med      gabapentin (NEURONTIN) 100 MG capsule Take 1 capsule (100 mg total) by mouth 2 (two) times daily as needed (Neuropathy), Starting Tue 11/24/2020, Print      guaiFENesin (MUCINEX) 600 MG 12 hr tablet Take 2 tablets (1,200 mg total) by mouth 2 (two) times daily, Starting Sun 08/30/2020, E-Rx      ibuprofen (ADVIL) 400 MG tablet Take 1 tablet (400 mg total) by mouth every 6 (six) hours as needed for Pain or Fever, Starting Tue 11/24/2020, E-Rx      lactobacillus/streptococcus (RISAQUAD) Cap Take 1 capsule by mouth daily, Starting Sat 03/21/2020, E-Rx      levothyroxine (SYNTHROID) 25 MCG tablet Take 1 tablet (25 mcg total) by mouth every morning, Starting Wed 01/29/2020, E-Rx      lisinopril (ZESTRIL) 40 MG tablet Take 1 tablet (40 mg total) by mouth daily, Starting Tue 09/29/2020, Print      magnesium oxide (MAG-OX) 400 MG tablet Take 1 tablet (400 mg total) by mouth daily, Starting Fri 05/29/2020, E-Rx  meclizine (ANTIVERT) 25 MG tablet Take 1 tablet (25 mg total) by mouth 3 (three) times daily as needed for Dizziness, Starting Thu 08/08/2019, E-Rx      Multiple Vitamins-Minerals (MULTIVITAMIN WITH MINERALS) tablet  Take 1 tablet by mouth every morning    , Historical Med      pantoprazole (PROTONIX) 40 MG tablet Take 1 tablet (40 mg total) by mouth every morning before breakfast, Starting Wed 01/29/2020, E-Rx      psyllium (Metamucil) 28 % packet Take 1 packet by mouth 2 (two) times daily, Starting Thu 11/19/2020, Until Fri 03/19/2021, No Print      rosuvastatin (CRESTOR) 40 MG tablet Take 0.5 tablets (20 mg total) by mouth daily, Starting Mon 09/28/2020, Print      vitamin B-1 (THIAMINE) 100 MG tablet Take 100 mg by mouth every morning., Historical Med                Lewis Shock, FNP  01/04/21 1249

## 2021-01-03 NOTE — ED Notes (Signed)
Ultrasound guided IV placement:  Procedure performed by Dr. Thad Ranger.  Site: Deep brachial vein. Left arm  Cleaned site with alcohol pad and chlorhexidine.  18 gauge IV inserted into vein with direct ultrasound guidance.  IV secured.         Georgana Curio, MD  01/03/21 1401

## 2021-01-08 LAB — ECG 12-LEAD
Atrial Rate: 75 {beats}/min
P Axis: 54 degrees
P-R Interval: 176 ms
Q-T Interval: 352 ms
QRS Duration: 88 ms
QTC Calculation (Bezet): 393 ms
R Axis: 23 degrees
T Axis: -24 degrees
Ventricular Rate: 75 {beats}/min

## 2021-02-08 ENCOUNTER — Other Ambulatory Visit: Payer: Self-pay | Admitting: Hematology & Oncology

## 2021-02-08 DIAGNOSIS — M810 Age-related osteoporosis without current pathological fracture: Secondary | ICD-10-CM

## 2021-02-22 ENCOUNTER — Ambulatory Visit
Admission: RE | Admit: 2021-02-22 | Discharge: 2021-02-22 | Disposition: A | Discharge status: Home / Self Care | Payer: Medicare Other | Source: Ambulatory Visit | Attending: Hematology & Oncology | Admitting: Hematology & Oncology

## 2021-02-22 ENCOUNTER — Other Ambulatory Visit: Payer: Self-pay | Admitting: Hematology & Oncology

## 2021-02-22 DIAGNOSIS — M8580 Other specified disorders of bone density and structure, unspecified site: Secondary | ICD-10-CM

## 2021-02-22 DIAGNOSIS — M810 Age-related osteoporosis without current pathological fracture: Secondary | ICD-10-CM

## 2021-02-23 ENCOUNTER — Ambulatory Visit (INDEPENDENT_AMBULATORY_CARE_PROVIDER_SITE_OTHER): Payer: Medicare Other | Admitting: Cardiology

## 2021-02-23 ENCOUNTER — Telehealth (INDEPENDENT_AMBULATORY_CARE_PROVIDER_SITE_OTHER): Payer: Self-pay

## 2021-02-23 ENCOUNTER — Encounter (INDEPENDENT_AMBULATORY_CARE_PROVIDER_SITE_OTHER): Payer: Self-pay | Admitting: Cardiology

## 2021-02-23 VITALS — BP 114/68 | HR 66 | Resp 18 | Ht 63.0 in | Wt 155.8 lb

## 2021-02-23 DIAGNOSIS — R079 Chest pain, unspecified: Secondary | ICD-10-CM

## 2021-02-23 LAB — ECG 12-LEAD
Atrial Rate: 60 {beats}/min
IHS MUSE NARRATIVE AND IMPRESSION: NORMAL
P Axis: 58 degrees
P-R Interval: 170 ms
Q-T Interval: 416 ms
QRS Duration: 72 ms
QTC Calculation (Bezet): 416 ms
R Axis: 54 degrees
T Axis: 50 degrees
Ventricular Rate: 60 {beats}/min

## 2021-02-23 NOTE — Telephone Encounter (Signed)
Call transferred by cardiac connect -    I spoke with pt who c/o chest pain. I asked pt to describe more about the symptoms that she is having and she declined, she just want to make an appt.     I told pt to go to ED if her symptoms are worsening. [facility] scheduled her today for 1pm at [facility].    Will send secure chat to Dr. Franchot Erichsen.

## 2021-02-23 NOTE — Progress Notes (Signed)
[facility] PROGRESS NOTE    I had the pleasure of seeing Emily [name] today for cardiovascular follow up. He is a pleasant 75 y.o. female with a ry ofatypical chest discomfort July 2020 that led to a normal Lexiscan nuclear scan and echocardiogram both July 2020 with ejection fraction of 60% and no significant valvular abnormality.  The patient was hospitalized with numbness in the left arm at the end of December 2021 and at that time it was not clear that she had any central neurologic event with a negative MRI and no findings to suggest that with neurology.    Over the past 1 week the patient has been having left shoulder and left upper chest discomfort especially if she tries to lift her left arm or if she lays on her left side.      MEDICATIONS:  Current Outpatient Medications   Medication Sig Dispense Refill   . amLODIPine (NORVASC) 10 MG tablet Take 1 tablet (10 mg total) by mouth daily 30 tablet 0   . anastrozole (ARIMIDEX) 1 MG tablet Take 1 tablet (1 mg total) by mouth daily (Patient taking differently: Take 1 mg by mouth every morning) 90 tablet 3   . Apoaequorin (PREVAGEN PO) Take by mouth daily     . aspirin 81 MG chewable tablet Chew 1 tablet (81 mg total) by mouth daily 90 tablet 0   . desonide (DESOWEN) 0.05 % ointment Apply topically 2 (two) times daily 15 g 0   . ezetimibe (ZETIA) 10 MG tablet Take 1 tablet (10 mg total) by mouth daily 90 tablet 3   . Ferrous Sulfate (IRON PO) Take by mouth     . gabapentin (NEURONTIN) 100 MG capsule Take 1 capsule (100 mg total) by mouth 2 (two) times daily as needed (Neuropathy) 20 capsule 0   . guaiFENesin (MUCINEX) 600 MG 12 hr tablet Take 2 tablets (1,200 mg total) by mouth 2 (two) times daily 14 tablet 0   . ibuprofen (ADVIL) 400 MG tablet Take 1 tablet (400 mg total) by mouth every 6 (six) hours as needed for Pain or Fever 20 tablet 0   . lactobacillus/streptococcus (RISAQUAD) Cap Take 1 capsule by mouth daily 10 capsule 0   . levothyroxine  (SYNTHROID) 25 MCG tablet Take 1 tablet (25 mcg total) by mouth every morning 30 tablet 0   . lisinopril (ZESTRIL) 40 MG tablet Take 1 tablet (40 mg total) by mouth daily 30 tablet 0   . magnesium oxide (MAG-OX) 400 MG tablet Take 1 tablet (400 mg total) by mouth daily 30 tablet 0   . meclizine (ANTIVERT) 25 MG tablet Take 1 tablet (25 mg total) by mouth 3 (three) times daily as needed for Dizziness 10 tablet 0   . Multiple Vitamins-Minerals (MULTIVITAMIN WITH MINERALS) tablet Take 1 tablet by mouth every morning         . pantoprazole (PROTONIX) 40 MG tablet Take 1 tablet (40 mg total) by mouth every morning before breakfast 30 tablet 0   . psyllium (Metamucil) 28 % packet Take 1 packet by mouth 2 (two) times daily 60 packet 3   . rosuvastatin (CRESTOR) 40 MG tablet Take 0.5 tablets (20 mg total) by mouth daily 90 tablet 3   . vitamin B-1 (THIAMINE) 100 MG tablet Take 100 mg by mouth every morning.           REVIEW OF SYSTEMS: All other systems reviewed and negative except as above.    PHYSICAL EXAMINATION  General Appearance: well-appearing and in no acute distress.   Vital Signs: BP 114/68 (BP Site: Left arm, Patient Position: Sitting, Cuff Size: Small)   Pulse 66   Resp 18   Ht 1.6 m (5\' 3" )   Wt 70.7 kg (155 lb 12.8 oz)   SpO2 98%   BMI 27.60 kg/m    HEENT: Sclera anicteric, conjunctiva without pallor, moist mucous membranes.  Neck: Supple without jugular venous distention.  Normal carotid upstrokes without bruits.  Chest: Clear to auscultation bilaterally with good air movement and respiratory effort and no wheezes, rales, or rhonchi  Tenderness to the touch in the region of the rotator cuff left upper chest  Cardiovascular: Normal S1 and  S2 without murmurs, gallops or rub. PMI of normal size and nondisplaced.   Extremities: Warm without edema.  The left shoulder and left upper chest is painful when the patient tries to lift her left arm and painful to palpation over that region      ECG: nsr normal  ekg  Past Medical History:   Diagnosis Date   . Breast cancer 2014    right breast mastectomy   . Exercise-induced angina     negative work up   . GERD (gastroesophageal reflux disease)    . Headache     resolved   . Hyperlipidemia    . Hypertension    . Hypothyroidism    . Low back pain    . Malignant neoplasm of overlapping sites of right female breast 10/19/2015   . Syncope and collapse     Chronic Vertigo   . Vertigo      Family History   Problem Relation Age of Onset   . Breast cancer Neg Hx      Social History     Socioeconomic History   . Marital status: Widowed     Spouse name: None   . Number of children: None   . Years of education: None   . Highest education level: None   Occupational History   . None   Tobacco Use   . Smoking status: Never Smoker   . Smokeless tobacco: Never Used   Vaping Use   . Vaping Use: Never used   Substance and Sexual Activity   . Alcohol use: Yes     Alcohol/week: 1.0 standard drink     Types: 1 Cans of beer per week     Comment: socially   . Drug use: No   . Sexual activity: Not Currently   Other Topics Concern   . None   Social History Narrative   . None     Social Determinants of Health     Financial Resource Strain: Not on file   Food Insecurity: Not on file   Transportation Needs: Not on file   Physical Activity: Not on file   Stress: Not on file   Social Connections: Not on file   Intimate Partner Violence: Not on file   Housing Stability: Not on file         IMPRESSION:  Hypertension  Hypercholesterolemia with LDL one thirty-six December 2021  Normal stress nuclear scan July 2020 and echocardiogram July 2020 showed ejection fraction 20% without valvular abnormality  Hospital stay December 2021 with arm numbness but no acute event by MRI or clinically  Cervical and thoracic spondylosis noted  Musculoskeletal left shoulder pain possible rotator cuff injury, noncardiac.      PLAN:   I recommended the patient see Dr. Judeth Cornfield  Montez Morita for further consideration of this  problem.  6 months cardiac follow-up    Royann Shivers, MD [facility]  02/23/2021

## 2021-02-26 ENCOUNTER — Encounter (INDEPENDENT_AMBULATORY_CARE_PROVIDER_SITE_OTHER): Payer: Self-pay

## 2021-03-04 ENCOUNTER — Emergency Department: Payer: Medicare Other

## 2021-03-04 ENCOUNTER — Emergency Department
Admission: EM | Admit: 2021-03-04 | Discharge: 2021-03-04 | Disposition: A | Discharge status: Home / Self Care | Payer: Medicare Other | Attending: Emergency Medicine | Admitting: Emergency Medicine

## 2021-03-04 DIAGNOSIS — N390 Urinary tract infection, site not specified: Secondary | ICD-10-CM | POA: Insufficient documentation

## 2021-03-04 DIAGNOSIS — M5432 Sciatica, left side: Secondary | ICD-10-CM

## 2021-03-04 DIAGNOSIS — Z20822 Contact with and (suspected) exposure to covid-19: Secondary | ICD-10-CM | POA: Insufficient documentation

## 2021-03-04 DIAGNOSIS — M791 Myalgia, unspecified site: Secondary | ICD-10-CM

## 2021-03-04 LAB — URINALYSIS REFLEX TO MICROSCOPIC EXAM - REFLEX TO CULTURE
Bilirubin, UA: NEGATIVE
Blood, UA: NEGATIVE
Glucose, UA: NEGATIVE
Ketones UA: NEGATIVE
Nitrite, UA: NEGATIVE
Protein, UR: NEGATIVE
Specific Gravity UA: 1.013 (ref 1.001–1.035)
Urine pH: 6 (ref 5.0–8.0)
Urobilinogen, UA: NEGATIVE mg/dL (ref 0.2–2.0)

## 2021-03-04 LAB — COVID-19 (SARS-COV-2) & INFLUENZA  A/B, NAA (ROCHE LIAT)
Influenza A: NOT DETECTED
Influenza B: NOT DETECTED
SARS CoV 2 Overall Result: NOT DETECTED

## 2021-03-04 MED ORDER — DICLOFENAC SODIUM 1 % EX GEL
2.0000 g | Freq: Four times a day (QID) | CUTANEOUS | 0 refills | Status: DC
Start: 2021-03-04 — End: 2021-11-17

## 2021-03-04 MED ORDER — CEPHALEXIN 500 MG PO CAPS
500.0000 mg | ORAL_CAPSULE | Freq: Two times a day (BID) | ORAL | 0 refills | Status: DC
Start: 2021-03-04 — End: 2021-03-10

## 2021-03-04 MED ORDER — KETOROLAC TROMETHAMINE 30 MG/ML IJ SOLN
30.0000 mg | Freq: Once | INTRAMUSCULAR | Status: AC
Start: 2021-03-04 — End: 2021-03-04
  Administered 2021-03-04: 30 mg via INTRAMUSCULAR
  Filled 2021-03-04: qty 1

## 2021-03-04 NOTE — ED Provider Notes (Signed)
EMERGENCY DEPARTMENT NOTE     ED PHYSICIAN ASSIGNED     Date/Time Event User Comments    03/04/21 0947 Physician Assigned Sharlett Iles, Wayland Salinas, DO assigned as Attending            HISTORY OF PRESENT ILLNESS   Historian:patient  Translator Used: No    Chief Complaint: Generalized Body Aches     Mechanism of Injury:       75 y.o. female presents with body aches for 2 days. Now some pain with shoulder movement. She reports hearing "cracks." she also reports feeling warm. Some coughing for 2 days. Some left buttock pain radiating into her left leg. No chest pain. Hx of breast cancer. Now on hormone therapy.  Slight dysuria.  No other complaints at this time.    1. Location of symptoms: generalized joints and respiratory system.   2. Onset of symptoms: 2 days ago  3. What was patient doing when symptoms started (Context): see above  4. Severity: moderate  5. Timing: ongoing  6. Activities that worsen symptoms: certain movements  7. Activities that improve symptoms: nothing so far  8. Quality: achy  9. Radiation of symptoms: yes from left buttock to left leg.   10. Associated signs and Symptoms: see above  11. Are symptoms worsening? yes  MEDICAL HISTORY     Past Medical History:  Past Medical History:   Diagnosis Date   . Breast cancer 2014    right breast mastectomy   . Exercise-induced angina     negative work up   . GERD (gastroesophageal reflux disease)    . Headache     resolved   . Hyperlipidemia    . Hypertension    . Hypothyroidism    . Low back pain    . Malignant neoplasm of overlapping sites of right female breast 10/19/2015   . Syncope and collapse     Chronic Vertigo   . Vertigo        Past Surgical History:  Past Surgical History:   Procedure Laterality Date   . COLONOSCOPY  2021   . COLONOSCOPY, POLYPECTOMY N/A 07/21/2020    Procedure: COLONOSCOPY, POLYPECTOMY;  Surgeon: Einar Grad, MD;  Location: [facility];  Service: Gastroenterology;  Laterality: N/A;   . EGD, BIOPSY N/A 07/21/2020     Procedure: EGD, BIOPSY;  Surgeon: Einar Grad, MD;  Location: [facility];  Service: Gastroenterology;  Laterality: N/A;   . HYSTERECTOMY     . MASTECTOMY Right 2015       Social History:  Social History     Socioeconomic History   . Marital status: Widowed   Tobacco Use   . Smoking status: Never Smoker   . Smokeless tobacco: Never Used   Vaping Use   . Vaping Use: Never used   Substance and Sexual Activity   . Alcohol use: Yes     Alcohol/week: 1.0 standard drink     Types: 1 Cans of beer per week     Comment: socially   . Drug use: No   . Sexual activity: Not Currently       Family History:  Family History   Problem Relation Age of Onset   . Breast cancer Neg Hx        Outpatient Medication:  Discharge Medication List as of 03/04/2021 12:46 PM      CONTINUE these medications which have NOT CHANGED    Details   amLODIPine (NORVASC) 10 MG tablet  Take 1 tablet (10 mg total) by mouth daily, Starting Tue 09/29/2020, Print      anastrozole (ARIMIDEX) 1 MG tablet Take 1 tablet (1 mg total) by mouth daily, Starting Fri 01/19/2018, Normal      Apoaequorin (PREVAGEN PO) Take by mouth daily, Historical Med      aspirin 81 MG chewable tablet Chew 1 tablet (81 mg total) by mouth daily, Starting Wed 01/29/2020, E-Rx      desonide (DESOWEN) 0.05 % ointment Apply topically 2 (two) times daily, Starting Sun 05/10/2020, E-Rx      ezetimibe (ZETIA) 10 MG tablet Take 1 tablet (10 mg total) by mouth daily, Starting Tue 10/20/2020, E-Rx      Ferrous Sulfate (IRON PO) Take by mouth, Historical Med      gabapentin (NEURONTIN) 100 MG capsule Take 1 capsule (100 mg total) by mouth 2 (two) times daily as needed (Neuropathy), Starting Tue 11/24/2020, Print      guaiFENesin (MUCINEX) 600 MG 12 hr tablet Take 2 tablets (1,200 mg total) by mouth 2 (two) times daily, Starting Sun 08/30/2020, E-Rx      ibuprofen (ADVIL) 400 MG tablet Take 1 tablet (400 mg total) by mouth every 6 (six) hours as needed for Pain or Fever, Starting Tue 11/24/2020, E-Rx       lactobacillus/streptococcus (RISAQUAD) Cap Take 1 capsule by mouth daily, Starting Sat 03/21/2020, E-Rx      levothyroxine (SYNTHROID) 25 MCG tablet Take 1 tablet (25 mcg total) by mouth every morning, Starting Wed 01/29/2020, E-Rx      lisinopril (ZESTRIL) 40 MG tablet Take 1 tablet (40 mg total) by mouth daily, Starting Tue 09/29/2020, Print      magnesium oxide (MAG-OX) 400 MG tablet Take 1 tablet (400 mg total) by mouth daily, Starting Fri 05/29/2020, E-Rx      meclizine (ANTIVERT) 25 MG tablet Take 1 tablet (25 mg total) by mouth 3 (three) times daily as needed for Dizziness, Starting Thu 08/08/2019, E-Rx      Multiple Vitamins-Minerals (MULTIVITAMIN WITH MINERALS) tablet Take 1 tablet by mouth every morning    , Historical Med      pantoprazole (PROTONIX) 40 MG tablet Take 1 tablet (40 mg total) by mouth every morning before breakfast, Starting Wed 01/29/2020, E-Rx      psyllium (Metamucil) 28 % packet Take 1 packet by mouth 2 (two) times daily, Starting Thu 11/19/2020, Until Fri 03/19/2021, No Print      rosuvastatin (CRESTOR) 40 MG tablet Take 0.5 tablets (20 mg total) by mouth daily, Starting Mon 09/28/2020, Print      vitamin B-1 (THIAMINE) 100 MG tablet Take 100 mg by mouth every morning., Historical Med               REVIEW OF SYSTEMS   Review of Systems   Constitutional: Negative for fever.   HENT: Negative for sore throat.    Respiratory: Negative for cough and shortness of breath.    Cardiovascular: Negative for chest pain.   Gastrointestinal: Negative for nausea and vomiting.   Genitourinary: Positive for dysuria.   Musculoskeletal: Positive for arthralgias, back pain (Left buttocks pain) and myalgias.   Skin: Negative for rash.   Neurological: Negative for syncope.   All other systems reviewed and are negative.             PHYSICAL EXAM     ED Triage Vitals [03/04/21 0944]   Enc Vitals Group      BP 169/76  Heart Rate 64      Resp Rate 16      Temp 98.2 F (36.8 C)      Temp Source Oral      SpO2 95  %      Weight 68 kg      Height 1.6 m      Head Circumference       Peak Flow       Pain Score 7      Pain Loc       Pain Edu?       Excl. in GC?      Nursing note and vitals reviewed.  Constitutional:  Well developed, well nourished. alert and awake  Head:  Atraumatic. Normocephalic.    Eyes:  PERRL. EOMI. No scleral icterus  ENT:  Mucous membranes are slightly tacky. Oropharynx is clear.  External ears normal. Patent airway.  Neck:  Supple. Full ROM.    Cardiovascular: Bradycardia. Regular rhythm. No murmurs, rubs, or gallops.  Pulmonary/Chest:  No evidence of respiratory distress. Clear to auscultation bilaterally.  No wheezing, rales or rhonchi.   Abdominal:  Soft and non-distended. No tenderness. No rebound, guarding, or rigidity.  Back:  Full ROM.  No back pain on my examination.  Able to raise great toe on the left leg.  There is some pain with straight leg raise at approximately 50 degrees.  Sensation grossly intact in the left lower extremity.  2+ deep tendon reflexes in the patella and Achilles on the left.  Extremities:  No edema. No cyanosis. Full range of motion in all extremities.  Left shoulder is tender to palpation on examination.  Skin:  Skin is warm and dry.  No diaphoresis. No rash.   Neurological:  Alert,  awake, and appropriate. Normal speech. Motor grossly normal. Cranial Nerves grossly intact by observation.   Psychiatric:  Good eye contact. Normal interaction, affect, and behavior.          MEDICAL DECISION MAKING     DISCUSSION    Patient presents to the emergency department with a chief complaint of body aches.  Differential diagnosis includes but is not limited to viral illness, COVID-19, influenza, UTI.  COVID and influenza are negative.  UA shows some leukocyte esterase and therefore urine culture will be obtained.  Patient does have some dysuria and therefore was placed on antibiotics pending culture.  Patient overall feels somewhat improved after receiving Toradol.  Patient does have  history of some renal insufficiency but I do feel 1 dose of Toradol is necessary.  She was given Voltaren for joint aches.  She has a history of known arthritis.  Patient also has some evidence of sciatica today but no evidence of cauda equina.  Overall nontoxic and well-appearing.  She is feeling much improved after above therapies.  Given strict return precautions and follow-up instructions.  Stable for discharge.  She agrees with disposition and plan.           Each of the differential diagnosis has been considered for diagnosis and weighed risk and benefit of further testing and evaluation in the context of patient complaint. Some diagnosis do not warrant further testing including imaging and/or labs. However, all differential diagnosis have been considered.    Vital Signs: Reviewed the patient's vital signs.   Nursing Notes: Reviewed and utilized available nursing notes.  Medical Records Reviewed: Reviewed available past medical records.  Counseling: The emergency provider has spoken with the patient and discussed today's findings, in  addition to providing specific details for the plan of care.  Questions are answered and there is agreement with the plan.        RADIOLOGY IMAGING STUDIES      Chest AP Portable   Final Result    No acute abnormality seen      Laurena Slimmer, MD    03/04/2021 10:22 AM              PULSE OXIMETRY    Oxygen Saturation by Pulse Oximetry: 100%  Interventions: none  Interpretation:  Normal    EMERGENCY DEPT. MEDICATIONS      ED Medication Orders (From admission, onward)    Start Ordered     Status Ordering Provider    03/04/21 1038 03/04/21 1037  ketorolac (TORADOL) injection 30 mg  Once        Route: Intramuscular  Ordered Dose: 30 mg     Last MAR action: Given Emily [name]          LABORATORY RESULTS    Ordered and independently interpreted AVAILABLE laboratory tests. Please see results section in chart for full details.  Results for orders placed or performed during the hospital  encounter of 03/04/21   COVID-19 (SARS-CoV-2) and Influenza A/B, NAA ([facility])- Age 66 and above    Specimen: Nasopharyngeal; Culturette   Result Value Ref Range    Purpose of COVID testing Diagnostic -PUI     SARS-CoV-2 Specimen Source Nasal Swab     SARS CoV 2 Overall Result Not Detected     Influenza A Not Detected     Influenza B Not Detected    Urinalysis Reflex to Microscopic Exam- Reflex to Culture   Result Value Ref Range    Urine Type Urine, Clean Ca     Color, UA Yellow Colorless - Yellow    Clarity, UA Clear Clear - Hazy    Specific Gravity UA 1.013 1.001 - 1.035    Urine pH 6.0 5.0 - 8.0    Leukocyte Esterase, UA Large (A) Negative    Nitrite, UA Negative Negative    Protein, UR Negative Negative    Glucose, UA Negative Negative    Ketones UA Negative Negative    Urobilinogen, UA Negative 0.2 - 2.0 mg/dL    Bilirubin, UA Negative Negative    Blood, UA Negative Negative    RBC, UA 0 - 2 0 - 5 /hpf    WBC, UA 0 - 5 0 - 5 /hpf    Squamous Epithelial Cells, Urine 0 - 5 0 - 25 /hpf    Hyaline Casts, UA 0 - 3 0 - 5 /lpf       CRITICAL CARE/PROCEDURES    Procedures      DIAGNOSIS      Diagnosis:  Final diagnoses:   COVID-19 ruled out by laboratory testing   Myalgia   Sciatica of left side   Acute UTI       Disposition:  ED Disposition     ED Disposition   Discharge    Condition   --    Date/Time   Thu Mar 04, 2021 12:43 PM    Comment   Emily [name] discharge to home/self care.    Condition at disposition: Stable               Prescriptions:  Discharge Medication List as of 03/04/2021 12:46 PM      START taking these medications    Details   cephalexin (KEFLEX)  500 MG capsule Take 1 capsule (500 mg total) by mouth 2 (two) times daily for 7 days, Starting Thu 03/04/2021, Until Thu 03/11/2021, E-Rx      diclofenac Sodium (VOLTAREN) 1 % Gel topical gel Apply 2 g topically 4 (four) times daily, Starting Thu 03/04/2021, Normal         CONTINUE these medications which have NOT CHANGED    Details   amLODIPine (NORVASC) 10 MG  tablet Take 1 tablet (10 mg total) by mouth daily, Starting Tue 09/29/2020, Print      anastrozole (ARIMIDEX) 1 MG tablet Take 1 tablet (1 mg total) by mouth daily, Starting Fri 01/19/2018, Normal      Apoaequorin (PREVAGEN PO) Take by mouth daily, Historical Med      aspirin 81 MG chewable tablet Chew 1 tablet (81 mg total) by mouth daily, Starting Wed 01/29/2020, E-Rx      desonide (DESOWEN) 0.05 % ointment Apply topically 2 (two) times daily, Starting Sun 05/10/2020, E-Rx      ezetimibe (ZETIA) 10 MG tablet Take 1 tablet (10 mg total) by mouth daily, Starting Tue 10/20/2020, E-Rx      Ferrous Sulfate (IRON PO) Take by mouth, Historical Med      gabapentin (NEURONTIN) 100 MG capsule Take 1 capsule (100 mg total) by mouth 2 (two) times daily as needed (Neuropathy), Starting Tue 11/24/2020, Print      guaiFENesin (MUCINEX) 600 MG 12 hr tablet Take 2 tablets (1,200 mg total) by mouth 2 (two) times daily, Starting Sun 08/30/2020, E-Rx      ibuprofen (ADVIL) 400 MG tablet Take 1 tablet (400 mg total) by mouth every 6 (six) hours as needed for Pain or Fever, Starting Tue 11/24/2020, E-Rx      lactobacillus/streptococcus (RISAQUAD) Cap Take 1 capsule by mouth daily, Starting Sat 03/21/2020, E-Rx      levothyroxine (SYNTHROID) 25 MCG tablet Take 1 tablet (25 mcg total) by mouth every morning, Starting Wed 01/29/2020, E-Rx      lisinopril (ZESTRIL) 40 MG tablet Take 1 tablet (40 mg total) by mouth daily, Starting Tue 09/29/2020, Print      magnesium oxide (MAG-OX) 400 MG tablet Take 1 tablet (400 mg total) by mouth daily, Starting Fri 05/29/2020, E-Rx      meclizine (ANTIVERT) 25 MG tablet Take 1 tablet (25 mg total) by mouth 3 (three) times daily as needed for Dizziness, Starting Thu 08/08/2019, E-Rx      Multiple Vitamins-Minerals (MULTIVITAMIN WITH MINERALS) tablet Take 1 tablet by mouth every morning    , Historical Med      pantoprazole (PROTONIX) 40 MG tablet Take 1 tablet (40 mg total) by mouth every morning before breakfast,  Starting Wed 01/29/2020, E-Rx      psyllium (Metamucil) 28 % packet Take 1 packet by mouth 2 (two) times daily, Starting Thu 11/19/2020, Until Fri 03/19/2021, No Print      rosuvastatin (CRESTOR) 40 MG tablet Take 0.5 tablets (20 mg total) by mouth daily, Starting Mon 09/28/2020, Print      vitamin B-1 (THIAMINE) 100 MG tablet Take 100 mg by mouth every morning., Historical Med                Suanne Marker, DO  03/04/21 1343

## 2021-03-04 NOTE — EDIE (Signed)
COLLECTIVE?NOTIFICATION?03/04/2021 09:40?Bayley, [place] B?MRN: [id number]    [place] [facility]'s patient encounter information:   OZH:?[id number]  Account [id number]  Billing Account [id number]      Criteria Met      5 ED Visits in 12 Months    Security and Safety  No Security Events were found.  ED Care Guidelines  There are currently no ED Care Guidelines for this patient. Please check your facility's medical records system.  Care History  No Care Histories were found.  Flags  No Flags were found.    Notable Tags (groups)  No Notable Tags (Groups) were found.    Prescription Monitoring Program  000??- Narcotic Use Score  000??- Sedative Use Score  000??- Stimulant Use Score  000??- Overdose Risk Score  - All Scores range from 000-999 with 75% of the population scoring < 200 and on 1% scoring above 650  - The last digit of the narcotic, sedative, and stimulant score indicates the number of active prescriptions of that type  - Higher Use scores correlate with increased prescribers, pharmacies, mg equiv, and overlapping prescriptions  - Higher Overdose Risk Scores correlate with increased risk of unintentional overdose death   Concerning or unexpectedly high scores should prompt a review of the PMP record; this does not constitute checking PMP for prescribing purposes.  Anticoagulant Claims   No Anticoagulant Claims were found.   E.D. Visit Count (12 mo.)  Facility Visits   [place] [facility] 10   Total 10   Note: Visits indicate total known visits.     Recent Emergency Department Visit Summary  Date Facility [facility] Type Diagnoses or Chief Complaint    Mar 04, 2021  [place] [facility]  [place]  [place]  Emergency      cough/body aches      Jan 03, 2021  [place] [facility]  [place]  [place]  Emergency      pain in both legs/cough      Generalized weakness      Dizziness      Chest Pain      Weakness      Paresthesia of skin      Dizziness and giddiness      Chest pain, unspecified       Nov 24, 2020  [place] [facility]  [place]  [place]  Emergency      dizziness, tingling      dizziness, tingling on left side      dizziness, tingling on left side, bodyaches      Dizziness      Polyneuropathy, unspecified      Malignant neoplasm of unspecified site of unspecified female breast      Sep 27, 2020  [place] [facility]  [facility]  [place]  Emergency      Body Pain      Extremity Weakness      Weakness      Contact with and (suspected) exposure to covid-19      Aug 30, 2020  [place] [facility]  [place]  [place]  Emergency      headache,vomiting,back pain      Dizziness      Cough      Solitary pulmonary nodule      Pneumonia, unspecified organism      May 28, 2020  [place] [facility]  [place]  [place]  Emergency      vaginal/rectal  bleeding/dizziness      Headache      Tingling      Dizziness and giddiness      May 12, 2020  [place] [facility]  [place].  [place]  Emergency      Chest Pain,Tingling,Weak      Tingling      Headache      Chest Pain      Back Pain      Chest pain, unspecified      Mastodynia      Headache, unspecified      Weakness      Paresthesia of skin      May 10, 2020  [place] [facility]  [place].  [place]  Emergency      weakness. lump by lt ear      Fatigue      Chest Pain      Generalized weakness      Pruritus, unspecified      Other chest pain      Mar 10, 2020  [place] - [facility]  [place].  [place]  Emergency      abd pain      Generalized weakness      Abdominal Pain      Contact with and (suspected) exposure to covid-19      Mar 08, 2020  [place] - [facility]  [place].  [place]  Emergency      fatige,      fatige, weakness      Breast Pain      Other chest pain      Chest pain, unspecified      Other specified anxiety disorders      Acquired absence of right breast and nipple        Recent Inpatient Visit Summary  Date Facility [facility] Type Diagnoses or Chief Complaint    Mar 10, 2020  [place] [facility]  [place]    [facility]      Contact with and (suspected) exposure to covid-19       Noninfective gastroenteritis and colitis, unspecified        Care Team  Provider Specialty Phone Fax Service Dates   Rondel Baton, M.D. Internal Medicine   Current    DAVIDSON, Janet Berlin MD M, MD Family Medicine: Adult Medicine [phone] Current      Collective Portal  This patient has registered at the [facility] Emergency Department   For more information visit: [email or web address]   Advance Care Plan  No Advance Care Plans found.    PLEASE NOTE:     1.   Any care recommendations and other clinical information are provided as guidelines or for historical purposes only, and providers should exercise their own clinical judgment when providing care.    2.   You may only use this information for purposes of treatment, payment or health care operations activities, and subject to the limitations of applicable Collective Policies.    3.   You should consult directly with the organization that provided a care guideline or other clinical history with any questions about additional information or accuracy or completeness of information provided.    ? 2022 [place], [employer] - [email or web address]

## 2021-03-04 NOTE — ED Notes (Signed)
Bed: E03  Expected date:   Expected time:   Means of arrival:   Comments:

## 2021-03-08 ENCOUNTER — Emergency Department: Payer: Medicare Other

## 2021-03-08 ENCOUNTER — Observation Stay
Admission: EM | Admit: 2021-03-08 | Discharge: 2021-03-10 | Disposition: A | Discharge status: Home / Self Care | Payer: Medicare Other | Attending: Internal Medicine | Admitting: Internal Medicine

## 2021-03-08 DIAGNOSIS — R42 Dizziness and giddiness: Secondary | ICD-10-CM | POA: Insufficient documentation

## 2021-03-08 DIAGNOSIS — R531 Weakness: Secondary | ICD-10-CM | POA: Insufficient documentation

## 2021-03-08 DIAGNOSIS — I10 Essential (primary) hypertension: Secondary | ICD-10-CM | POA: Insufficient documentation

## 2021-03-08 DIAGNOSIS — Z79899 Other long term (current) drug therapy: Secondary | ICD-10-CM | POA: Insufficient documentation

## 2021-03-08 DIAGNOSIS — D72829 Elevated white blood cell count, unspecified: Secondary | ICD-10-CM | POA: Insufficient documentation

## 2021-03-08 DIAGNOSIS — I161 Hypertensive emergency: Secondary | ICD-10-CM

## 2021-03-08 DIAGNOSIS — E782 Mixed hyperlipidemia: Secondary | ICD-10-CM | POA: Diagnosis present

## 2021-03-08 DIAGNOSIS — R4182 Altered mental status, unspecified: Secondary | ICD-10-CM

## 2021-03-08 DIAGNOSIS — Z8639 Personal history of other endocrine, nutritional and metabolic disease: Secondary | ICD-10-CM

## 2021-03-08 DIAGNOSIS — R519 Headache, unspecified: Secondary | ICD-10-CM | POA: Insufficient documentation

## 2021-03-08 DIAGNOSIS — Z20822 Contact with and (suspected) exposure to covid-19: Secondary | ICD-10-CM | POA: Insufficient documentation

## 2021-03-08 DIAGNOSIS — Z853 Personal history of malignant neoplasm of breast: Secondary | ICD-10-CM | POA: Insufficient documentation

## 2021-03-08 DIAGNOSIS — R079 Chest pain, unspecified: Secondary | ICD-10-CM

## 2021-03-08 DIAGNOSIS — G929 Unspecified toxic encephalopathy: Principal | ICD-10-CM | POA: Insufficient documentation

## 2021-03-08 DIAGNOSIS — Z87440 Personal history of urinary (tract) infections: Secondary | ICD-10-CM | POA: Insufficient documentation

## 2021-03-08 DIAGNOSIS — E039 Hypothyroidism, unspecified: Secondary | ICD-10-CM | POA: Insufficient documentation

## 2021-03-08 DIAGNOSIS — R5381 Other malaise: Secondary | ICD-10-CM | POA: Insufficient documentation

## 2021-03-08 DIAGNOSIS — Z7982 Long term (current) use of aspirin: Secondary | ICD-10-CM | POA: Insufficient documentation

## 2021-03-08 LAB — CBC AND DIFFERENTIAL
Absolute NRBC: 0 10*3/uL (ref 0.00–0.00)
Basophils Absolute Automated: 0.02 10*3/uL (ref 0.00–0.08)
Basophils Automated: 0.2 %
Eosinophils Absolute Automated: 0 10*3/uL (ref 0.00–0.44)
Eosinophils Automated: 0 %
Hematocrit: 37.2 % (ref 34.7–43.7)
Hgb: 11.5 g/dL (ref 11.4–14.8)
Immature Granulocytes Absolute: 0.04 10*3/uL (ref 0.00–0.07)
Immature Granulocytes: 0.3 %
Lymphocytes Absolute Automated: 1.36 10*3/uL (ref 0.42–3.22)
Lymphocytes Automated: 11 %
MCH: 27.3 pg (ref 25.1–33.5)
MCHC: 30.9 g/dL — ABNORMAL LOW (ref 31.5–35.8)
MCV: 88.2 fL (ref 78.0–96.0)
MPV: 11.2 fL (ref 8.9–12.5)
Monocytes Absolute Automated: 0.44 10*3/uL (ref 0.21–0.85)
Monocytes: 3.6 %
Neutrophils Absolute: 10.46 10*3/uL — ABNORMAL HIGH (ref 1.10–6.33)
Neutrophils: 84.9 %
Nucleated RBC: 0 /100 WBC (ref 0.0–0.0)
Platelets: 291 10*3/uL (ref 142–346)
RBC: 4.22 10*6/uL (ref 3.90–5.10)
RDW: 13 % (ref 11–15)
WBC: 12.32 10*3/uL — ABNORMAL HIGH (ref 3.10–9.50)

## 2021-03-08 LAB — URINALYSIS REFLEX TO MICROSCOPIC EXAM - REFLEX TO CULTURE
Bilirubin, UA: NEGATIVE
Blood, UA: NEGATIVE
Glucose, UA: NEGATIVE
Ketones UA: 5 — AB
Leukocyte Esterase, UA: NEGATIVE
Nitrite, UA: NEGATIVE
Protein, UR: 30 — AB
Specific Gravity UA: 1.035 (ref 1.001–1.035)
Urine pH: 6 (ref 5.0–8.0)
Urobilinogen, UA: NEGATIVE mg/dL (ref 0.2–2.0)

## 2021-03-08 LAB — BASIC METABOLIC PANEL
Anion Gap: 9 (ref 5.0–15.0)
BUN: 15 mg/dL (ref 7–19)
CO2: 25 mEq/L (ref 22–29)
Calcium: 9.4 mg/dL (ref 7.9–10.2)
Chloride: 102 mEq/L (ref 100–111)
Creatinine: 0.8 mg/dL (ref 0.6–1.0)
Glucose: 107 mg/dL — ABNORMAL HIGH (ref 70–100)
Potassium: 4.2 mEq/L (ref 3.5–5.1)
Sodium: 136 mEq/L (ref 136–145)

## 2021-03-08 LAB — HEPATIC FUNCTION PANEL
ALT: 19 U/L (ref 0–55)
AST (SGOT): 24 U/L (ref 5–34)
Albumin/Globulin Ratio: 1.5 (ref 0.9–2.2)
Albumin: 3.9 g/dL (ref 3.5–5.0)
Alkaline Phosphatase: 78 U/L (ref 37–106)
Bilirubin Direct: 0.2 mg/dL (ref 0.0–0.5)
Bilirubin Indirect: 0.2 mg/dL (ref 0.2–1.0)
Bilirubin, Total: 0.4 mg/dL (ref 0.2–1.2)
Globulin: 2.6 g/dL (ref 2.0–3.6)
Protein, Total: 6.5 g/dL (ref 6.0–8.3)

## 2021-03-08 LAB — COVID-19 (SARS-COV-2): SARS CoV-2: NEGATIVE

## 2021-03-08 LAB — PT/INR
PT INR: 1 (ref 0.9–1.1)
PT: 11.9 s (ref 10.1–12.9)

## 2021-03-08 LAB — TSH: TSH: 0.56 u[IU]/mL (ref 0.35–4.94)

## 2021-03-08 LAB — RAPID DRUG SCREEN, URINE
Barbiturate Screen, UR: NEGATIVE
Benzodiazepine Screen, UR: NEGATIVE
Cannabinoid Screen, UR: NEGATIVE
Cocaine, UR: NEGATIVE
Opiate Screen, UR: NEGATIVE
PCP Screen, UR: NEGATIVE
Urine Amphetamine Screen: NEGATIVE

## 2021-03-08 LAB — ECG 12-LEAD
P Axis: 54 degrees
Q-T Interval: 380 ms
T Axis: 34 degrees

## 2021-03-08 LAB — APTT: PTT: 39 s (ref 27–39)

## 2021-03-08 LAB — TROPONIN I: Troponin I: <0.01 ng/mL (ref 0.00–0.05)

## 2021-03-08 LAB — GLUCOSE WHOLE BLOOD - POCT: Whole Blood Glucose POCT: 117 mg/dL — ABNORMAL HIGH (ref 70–100)

## 2021-03-08 LAB — LIPASE: Lipase: 20 U/L (ref 8–78)

## 2021-03-08 LAB — MAGNESIUM: Magnesium: 2 mg/dL (ref 1.6–2.6)

## 2021-03-08 LAB — GFR: EGFR: >60

## 2021-03-08 MED ORDER — IOHEXOL 350 MG/ML IV SOLN
100.0000 mL | Freq: Once | INTRAVENOUS | Status: AC | PRN
Start: 2021-03-08 — End: 2021-03-08
  Administered 2021-03-08: 40 mL via INTRAVENOUS

## 2021-03-08 MED ORDER — LABETALOL HCL 5 MG/ML IV SOLN (WRAP)
10.0000 mg | Freq: Once | INTRAVENOUS | Status: DC
Start: 2021-03-08 — End: 2021-03-10

## 2021-03-08 MED ORDER — IOHEXOL 350 MG/ML IV SOLN
80.0000 mL | Freq: Once | INTRAVENOUS | Status: AC | PRN
Start: 2021-03-08 — End: 2021-03-08
  Administered 2021-03-08: 80 mL via INTRAVENOUS

## 2021-03-08 MED ORDER — ASPIRIN 325 MG PO TABS
325.0000 mg | ORAL_TABLET | Freq: Every day | ORAL | Status: AC
Start: 2021-03-09 — End: 2021-03-09
  Administered 2021-03-09: 325 mg via ORAL
  Filled 2021-03-08: qty 1

## 2021-03-08 NOTE — ED Notes (Signed)
MD at bedside assessing patient  

## 2021-03-08 NOTE — ED Notes (Signed)
MD at bedside speaking with family regarding decision for tPA admin; Holding on Labetalol at this time per MD; getting blood and EKG

## 2021-03-08 NOTE — H&P (Signed)
ADMISSION HISTORY AND PHYSICAL EXAM    [facility], DIVISION OF HOSPITALIST MEDICINE   [place] [facility]   [place] Pager: [phone]      Date Time: 03/08/21 11:38 PM  Patient Name: Emily [name]  Attending Physician: Alecia Lemming, MD  Primary Care Physician: Rondel Baton, MD    CC: Altered mental status  History Gathered From: Self, ED physician and records    Assessment:     Active Hospital Problems    Diagnosis   . Altered mental status       Emily [name] is a 75 y.o. female with a PMHx of hypothyroidism, history of breast cancer status post right mastectomy, hypertension, hyperlipidemia mated on 09/27/2020 with left-sided weakness/numbness and had a negative work-up including MRI presenting with altered mental status    Plan:   -Admit to [employer] service    1. Altered mental status  -Presented with altered mental status.  Currently alert and oriented  -No focal neurologic deficit on exam   -CT head negative for acute process  -CT perfusion and CTA head and neck unremarkable  -Continue neuro check every 4 hour.  Swallow screen  -Etiology unclear  -MRI brain pending  -Continue aspirin and statin    2. Hypertension, poorly controlled  -Continue amlodipine and lisinopril  -Monitor blood pressure    3. Hypothyroidism  -On levothyroxine    4. History of breast cancer  - Status post right mastectomy  -Continue anastrozole    5. Hyperlipidemia  -On Crestor    6.  Recent urinary tract infection  -Diagnosed with UTI on 6/02 and currently on Keflex to be completed on 6/09  -UA today is negative for nitrite and leukocyte esterase          Nutrition:No diet orders on file    Code status: Full code       Status/Disposition:   Pt is admitted under OBSERVATION with altered mental status  Anticipated medical stability for discharge: 24 Hrs     History of Presenting Illnes   Emily [name] is a 75 y.o. female with past medical history of hypothyroidism, history of breast cancer status post right  mastectomy, hypertension, hyperlipidemia mated on 09/27/2020 with left-sided weakness/numbness and had a negative work-up including MRI presenting with altered mental status.  Per ED, patient was not alert to place.  During my evaluation patient is alert and oriented x3.  She denies localized weakness, numbness, change in speech or vision.  She denies abdominal pain, vomiting, or diarrhea.  She denies chest pain, cough, or shortness of breath.  She reports some urinary symptoms and stated that she has been on antibiotic.  Neurology was consulted by the ED and patient was offered tPA and refused.  She was seen in the ED on 6/02 and started on Keflex for UTI.    In the ED, BP 185/85 with heart rate of 78 and temperature of 99.2 saturating 98% on room air.  Her lab work reveals WBC of 12.3, hemoglobin 11.5, and creatinine 0.8.  UA negative for UTI.  Urine negative.  CT head was negative for acute process.  CTA head and neck was unremarkable and CT perfusion showed no evidence of acute infarct or ischemia.  She received aspirin 325 mg in the ED.  Hospitalist service consulted for further relation and management.    Past Medical Histor     Past Medical History:   Diagnosis Date   . Breast cancer 2014  right breast mastectomy   . Exercise-induced angina     negative work up   . GERD (gastroesophageal reflux disease)    . Headache     resolved   . Hyperlipidemia    . Hypertension    . Hypothyroidism    . Low back pain    . Malignant neoplasm of overlapping sites of right female breast 10/19/2015   . Syncope and collapse     Chronic Vertigo   . Vertigo          Available old records reviewed, including: EPIC     Past Surgical History:     Past Surgical History:   Procedure Laterality Date   . COLONOSCOPY  2021   . COLONOSCOPY, POLYPECTOMY N/A 07/21/2020    Procedure: COLONOSCOPY, POLYPECTOMY;  Surgeon: Einar Grad, MD;  Location: [facility];  Service: Gastroenterology;  Laterality: N/A;   . EGD, BIOPSY N/A 07/21/2020     Procedure: EGD, BIOPSY;  Surgeon: Einar Grad, MD;  Location: [facility];  Service: Gastroenterology;  Laterality: N/A;   . HYSTERECTOMY     . MASTECTOMY Right 2015       Family History:     Family History   Problem Relation Age of Onset   . Breast cancer Neg Hx        Social History:    reports that she has never smoked. She has never used smokeless tobacco. She reports current alcohol use of about 1.0 standard drink of alcohol per week. She reports that she does not use drugs.    Allergies:     Allergies   Allergen Reactions   . Morphine Itching   . Morphine And Related Itching and Nausea And Vomiting     dizzy       Medications:     Home Medications     Med List Status: In Progress Set By: Jethro Poling, RN at 03/08/2021  8:59 PM                amLODIPine (NORVASC) 10 MG tablet     Take 1 tablet (10 mg total) by mouth daily     anastrozole (ARIMIDEX) 1 MG tablet     Take 1 tablet (1 mg total) by mouth daily     Patient taking differently: Take 1 mg by mouth every morning     Apoaequorin (PREVAGEN PO)     Take by mouth daily     aspirin 81 MG chewable tablet     Chew 1 tablet (81 mg total) by mouth daily     cephalexin (KEFLEX) 500 MG capsule     Take 1 capsule (500 mg total) by mouth 2 (two) times daily for 7 days     desonide (DESOWEN) 0.05 % ointment     Apply topically 2 (two) times daily     diclofenac Sodium (VOLTAREN) 1 % Gel topical gel     Apply 2 g topically 4 (four) times daily     ezetimibe (ZETIA) 10 MG tablet     Take 1 tablet (10 mg total) by mouth daily     Ferrous Sulfate (IRON PO)     Take by mouth     gabapentin (NEURONTIN) 100 MG capsule     Take 1 capsule (100 mg total) by mouth 2 (two) times daily as needed (Neuropathy)     guaiFENesin (MUCINEX) 600 MG 12 hr tablet     Take 2 tablets (1,200 mg total) by mouth  2 (two) times daily     ibuprofen (ADVIL) 400 MG tablet     Take 1 tablet (400 mg total) by mouth every 6 (six) hours as needed for Pain or Fever     lactobacillus/streptococcus  (RISAQUAD) Cap     Take 1 capsule by mouth daily     levothyroxine (SYNTHROID) 25 MCG tablet     Take 1 tablet (25 mcg total) by mouth every morning     lisinopril (ZESTRIL) 40 MG tablet     Take 1 tablet (40 mg total) by mouth daily     magnesium oxide (MAG-OX) 400 MG tablet     Take 1 tablet (400 mg total) by mouth daily     meclizine (ANTIVERT) 25 MG tablet     Take 1 tablet (25 mg total) by mouth 3 (three) times daily as needed for Dizziness     Multiple Vitamins-Minerals (MULTIVITAMIN WITH MINERALS) tablet     Take 1 tablet by mouth every morning         pantoprazole (PROTONIX) 40 MG tablet     Take 1 tablet (40 mg total) by mouth every morning before breakfast     psyllium (Metamucil) 28 % packet     Take 1 packet by mouth 2 (two) times daily     rosuvastatin (CRESTOR) 40 MG tablet     Take 0.5 tablets (20 mg total) by mouth daily     vitamin B-1 (THIAMINE) 100 MG tablet     Take 100 mg by mouth every morning.          Method by which medications were confirmed on admission: Patient      Review of Systems:   All other systems were reviewed and are negative except:as above in HPI     Physical Exam:     Patient Vitals for the past 24 hrs:   BP Temp Temp src Pulse Resp SpO2 Height Weight   03/08/21 2245 -- -- -- 78 (!) 41 98 % -- --   03/08/21 2240 -- -- -- 64 (!) 39 99 % -- --   03/08/21 2235 -- -- -- 68 (!) 28 99 % -- --   03/08/21 2230 185/85 -- -- 71 (!) 24 99 % -- --   03/08/21 2225 -- -- -- 70 (!) 24 99 % -- --   03/08/21 2220 -- -- -- 76 (!) 58 99 % -- --   03/08/21 2215 -- -- -- 71 (!) 28 98 % -- --   03/08/21 2210 -- -- -- 74 20 99 % -- --   03/08/21 2210 -- -- -- 74 (!) 26 98 % -- --   03/08/21 2205 -- -- -- 72 16 98 % -- --   03/08/21 2204 185/86 -- -- 72 20 98 % -- --   03/08/21 2204 185/81 -- -- -- 22 -- -- --   03/08/21 2202 186/84 -- -- 77 -- 98 % -- --   03/08/21 2115 -- -- -- 72 -- 100 % -- --   03/08/21 2110 -- -- -- 70 -- 99 % -- --   03/08/21 2108 183/83 -- -- 69 -- 99 % -- --   03/08/21 2105  -- -- -- 70 -- 99 % -- --   03/08/21 2100 -- -- -- 68 -- 99 % -- --   03/08/21 2055 -- -- -- 67 -- 99 % -- --   03/08/21 2050 185/86 99.2 F (37.3 C) Oral 72 16  99 % 1.6 m (5\' 3" ) 68 kg (149 lb 14.6 oz)     Body mass index is 26.56 kg/m.  No intake or output data in the 24 hours ending 03/08/21 2338    General: awake;  no acute distress.  HEENT: perrla, eomi, sclera anicteric  oropharynx clear without lesions, mucous membranes moist  Neck: supple, no lymphadenopathy, no JVD, no carotid bruits  Cardiovascular: Normal S1 and S2, no murmurs, rubs or gallops  Lungs: clear to auscultation bilaterally, without wheezing, rhonchi, or rales  Abdomen: soft, non-tender, non-distended; no palpable masses, no hepatosplenomegaly, normoactive bowel sounds, no rebound or guarding  Extremities: no clubbing, cyanosis, or edema.  Mild tenderness on the left shoulder.  No swelling or erythema.  Neuro: alert, oriented x 3, cranial nerves grossly intact, strength 5/5 in upper and lower extremities, sensation intact.  No focal neurologic deficit.  Skin: no rashes or lesions noted        Labs:     Results     Procedure Component Value Units Date/Time    Rapid drug screen, urine [[id number]] Collected: 03/08/21 2244    Specimen: Urine Updated: 03/08/21 2313     Urine Amphetamine Screen Negative     Barbiturate Screen, UR Negative     Benzodiazepine Screen, UR Negative     Cannabinoid Screen, UR Negative     Cocaine, UR Negative     Opiate Screen, UR Negative     PCP Screen, UR Negative    TSH [[id number]] Collected: 03/08/21 2215    Specimen: Blood Updated: 03/08/21 2304     TSH 0.56 uIU/mL     Urinalysis Reflex to Microscopic Exam- Reflex to Culture [[id number]]  (Abnormal) Collected: 03/08/21 2244     Updated: 03/08/21 2304     Urine Type Urine, Clean Ca     Color, UA Yellow     Clarity, UA Clear     Specific Gravity UA 1.035     Urine pH 6.0     Leukocyte Esterase, UA Negative     Nitrite, UA Negative     Protein, UR 30     Glucose, UA  Negative     Ketones UA 5     Urobilinogen, UA Negative mg/dL      Bilirubin, UA Negative     Blood, UA Negative     RBC, UA 3 - 5 /hpf      WBC, UA 0 - 5 /hpf      Squamous Epithelial Cells, Urine 0 - 5 /hpf     Troponin I [[id number]] Collected: 03/08/21 2215    Specimen: Blood Updated: 03/08/21 2251     Troponin I <0.01 ng/mL     Prothrombin time/INR [[id number]] Collected: 03/08/21 2215    Specimen: Blood Updated: 03/08/21 2247     PT 11.9 sec      PT INR 1.0    APTT [[id number]] Collected: 03/08/21 2215     Updated: 03/08/21 2247     PTT 39 sec     CBC and differential [[id number]] Collected: 03/08/21 2215    Specimen: Blood Updated: 03/08/21 2245    Comprehensive metabolic panel [[id number]] Collected: 03/08/21 2215    Specimen: Blood Updated: 03/08/21 2245    Troponin I [[id number]] Collected: 03/08/21 2215    Specimen: Blood Updated: 03/08/21 2245    GFR [[id number]] Collected: 03/08/21 2215     Updated: 03/08/21 2245     EGFR >60.0    Basic Metabolic Panel [[id number]]  (Abnormal) Collected: 03/08/21  2215    Specimen: Blood Updated: 03/08/21 2245     Glucose 107 mg/dL      BUN 15 mg/dL      Creatinine 0.8 mg/dL      Calcium 9.4 mg/dL      Sodium 161 mEq/L      Potassium 4.2 mEq/L      Chloride 102 mEq/L      CO2 25 mEq/L      Anion Gap 9.0    Hepatic function panel (LFT) [[id number]] Collected: 03/08/21 2215    Specimen: Blood Updated: 03/08/21 2245     Bilirubin, Total 0.4 mg/dL      Bilirubin Direct 0.2 mg/dL      Bilirubin Indirect 0.2 mg/dL      AST (SGOT) 24 U/L      ALT 19 U/L      Alkaline Phosphatase 78 U/L      Protein, Total 6.5 g/dL      Albumin 3.9 g/dL      Globulin 2.6 g/dL      Albumin/Globulin Ratio 1.5    Lipase [[id number]] Collected: 03/08/21 2215    Specimen: Blood Updated: 03/08/21 2245     Lipase 20 U/L     Magnesium [[id number]] Collected: 03/08/21 2215    Specimen: Blood Updated: 03/08/21 2245     Magnesium 2.0 mg/dL     VHQIO-96 (SARS-COV-2) ([place] [facility]) [[id number]] Collected: 03/08/21 2215     Specimen: Nasopharyngeal Swab from Nasopharynx Updated: 03/08/21 2240     Purpose of COVID testing Screening     SARS-CoV-2 Specimen Source Nasopharyngeal     SARS CoV-2 Negative    Narrative:      o Collect and clearly label specimen type:  o Upper respiratory specimen: One Nasopharyngeal Dry Swab NO  Transport Media.  o Hand deliver to laboratory ASAP  Indication for testing->Extended care facility admission to  semi private room  Screening    CBC and differential [[id number]]  (Abnormal) Collected: 03/08/21 2215    Specimen: Blood Updated: 03/08/21 2225     WBC 12.32 x10 3/uL      Hgb 11.5 g/dL      Hematocrit 36.6 %      Platelets 291 x10 3/uL      RBC 4.22 x10 6/uL      MCV 88.2 fL      MCH 27.3 pg      MCHC 30.9 g/dL      RDW 13 %      MPV 11.2 fL      Neutrophils 84.9 %      Lymphocytes Automated 11.0 %      Monocytes 3.6 %      Eosinophils Automated 0.0 %      Basophils Automated 0.2 %      Immature Granulocytes 0.3 %      Nucleated RBC 0.0 /100 WBC      Neutrophils Absolute 10.46 x10 3/uL      Lymphocytes Absolute Automated 1.36 x10 3/uL      Monocytes Absolute Automated 0.44 x10 3/uL      Eosinophils Absolute Automated 0.00 x10 3/uL      Basophils Absolute Automated 0.02 x10 3/uL      Immature Granulocytes Absolute 0.04 x10 3/uL      Absolute NRBC 0.00 x10 3/uL     Glucose Whole Blood - POCT [[id number]]  (Abnormal) Collected: 03/08/21 2133     Updated: 03/08/21 2202     Whole Blood Glucose POCT 117 mg/dL  EKG   Last EKG Result     Procedure Component Value Units Date/Time    ECG 12 lead [[id number]] Collected: 03/08/21 2214     Updated: 03/08/21 2215     Ventricular Rate 73 BPM      Atrial Rate 73 BPM      P-R Interval 200 ms      QRS Duration 88 ms      Q-T Interval 380 ms      QTC Calculation (Bezet) 418 ms      P Axis 54 degrees      R Axis 40 degrees      T Axis 34 degrees      IHS MUSE NARRATIVE AND IMPRESSION --     NORMAL SINUS RHYTHM  POSSIBLE  LEFT ATRIAL ENLARGEMENT  BORDERLINE  NORMAL/ABNORMAL ELECTROCARDIOGRAM  WHEN COMPARED WITH ECG OF  23-Feb-2021 13:15,  NO SIGNIFICANT CHANGE WAS FOUND      Narrative:      NORMAL SINUS RHYTHM  POSSIBLE  LEFT ATRIAL ENLARGEMENT  BORDERLINE NORMAL/ABNORMAL ELECTROCARDIOGRAM  WHEN COMPARED WITH ECG OF  23-Feb-2021 13:15,  NO SIGNIFICANT CHANGE WAS FOUND          Imaging personally reviewed, including: all available   CTA  Head & Neck    Result Date: 03/08/2021   No evidence of large vessel occlusion. Unremarkable CTA brain and neck. Aldean Ast, MD  03/08/2021 10:43 PM    CT Head WO Contrast    Result Date: 03/08/2021  No CT evidence of an acute intracranial abnormality. Aldean Ast, MD  03/08/2021 9:54 PM    CT Perfusion Brain    Result Date: 03/08/2021   No evidence of acute infarct or ischemia. Aldean Ast, MD  03/08/2021 10:29 PM      Safety Checklist  DVT prophylaxis:  CHEST guideline (See page [id number]) Chemical     This note was generated by the Epic EMR system/ Dragon speech recognition and may contain inherent errors or omissions not intended by the user. Grammatical errors, random word insertions, deletions and pronoun errors  are occasional consequences of this technology due to software limitations. Not all errors are caught or corrected. If there are questions or concerns about the content of this note or information contained within the body of this dictation they should be addressed directly with the author for clarification.    Signed by: Alecia Lemming, MD, MD   XB:[id number], Collie Siad, MD

## 2021-03-08 NOTE — ED Notes (Signed)
Bed: E16  Expected date:   Expected time:   Means of arrival:   Comments:

## 2021-03-08 NOTE — ED Notes (Signed)
Pt assisted to bedside commode without issue and 1 RN; able to provide urine sample; Steady on feet getting out of bed and back into bed; no deficits noted; VSS and call bell in reach

## 2021-03-08 NOTE — ED Notes (Addendum)
Handoff report given to [place], RN; NIH remains 1 at this time

## 2021-03-08 NOTE — ED Notes (Signed)
Family (nephew and son) remain at bedside; call bell in reach; updated on plan of care; Family at this time would like to NOT administer tPA based on recent CTA results, NIH, and speaking with MD

## 2021-03-08 NOTE — ED Notes (Signed)
MD at bedside speaking with family regarding CT and CTA results

## 2021-03-08 NOTE — ED Notes (Signed)
Family at bedside discussing with uncle regarding if they would like to have tPA; BP 185/81; MD aware; NIH remains 1; Pt able to recognize family at bedside

## 2021-03-08 NOTE — ED Notes (Signed)
Pt remains in CT for CTA; NIH 1 as pt is not alert to place but has no other noted neuro deficits

## 2021-03-08 NOTE — ED Notes (Signed)
MD calling code stroke on pt; Spoke with pt's neighbors who reported that the last known well was approx 1900 while at her house; Emily [name] report that pt was then seen at 1930 with slurred speech and confusion; CODE Stroke called and pt brought to CT

## 2021-03-08 NOTE — ED Provider Notes (Addendum)
EMERGENCY DEPARTMENT NOTE     Patient initially seen and examined at   ED PHYSICIAN ASSIGNED     Date/Time Event User Comments    03/08/21 2107 Physician Assigned Hilliard Clark, MD assigned as Attending         ED MIDLEVEL (APP) ASSIGNED     None          HISTORY OF PRESENT ILLNESS   Historian:Patient  Translator Used: no    Chief Complaint: Altered Mental Status       Mechanism of Injury:       75 y.o. female, with a history of breast cancer on oral chemotherapy, HTN, HLD, Hypothyroidism, and GERD presents to the ER with acute onset of altered mental status. Per neighbor, last known normal was at 7 PM, 2.5 hours ago. The neighbor says she was at patient's house and she was acting at her baseline. The neighbor left for 30 minutes and returned at 7:30 PM and patient was not acting like herself. She didn't recognize people, she was not answering questions correctly. She is alert to self only with intermittent slurring speech. Patient keeps talking about going upstairs when asked "how are you feeling."    Of note, patient was recently seen here 4 days ago and diagnosed with a UTI. She was started on Keflex.       MEDICAL HISTORY     Past Medical History:  Past Medical History:   Diagnosis Date   . Breast cancer 2014    right breast mastectomy   . Exercise-induced angina     negative work up   . GERD (gastroesophageal reflux disease)    . Headache     resolved   . Hyperlipidemia    . Hypertension    . Hypothyroidism    . Low back pain    . Malignant neoplasm of overlapping sites of right female breast 10/19/2015   . Syncope and collapse     Chronic Vertigo   . Vertigo        Past Surgical History:  Past Surgical History:   Procedure Laterality Date   . COLONOSCOPY  2021   . COLONOSCOPY, POLYPECTOMY N/A 07/21/2020    Procedure: COLONOSCOPY, POLYPECTOMY;  Surgeon: Einar Grad, MD;  Location: [facility];  Service: Gastroenterology;  Laterality: N/A;   . EGD, BIOPSY N/A 07/21/2020    Procedure: EGD,  BIOPSY;  Surgeon: Einar Grad, MD;  Location: [facility];  Service: Gastroenterology;  Laterality: N/A;   . HYSTERECTOMY     . MASTECTOMY Right 2015       Social History:  Social History     Socioeconomic History   . Marital status: Widowed   Tobacco Use   . Smoking status: Never Smoker   . Smokeless tobacco: Never Used   Vaping Use   . Vaping Use: Never used   Substance and Sexual Activity   . Alcohol use: Yes     Alcohol/week: 1.0 standard drink     Types: 1 Cans of beer per week     Comment: socially   . Drug use: No   . Sexual activity: Not Currently       Family History:  Family History   Problem Relation Age of Onset   . Breast cancer Neg Hx        Outpatient Medication:  Previous Medications    AMLODIPINE (NORVASC) 10 MG TABLET    Take 1 tablet (10 mg total) by mouth daily  ANASTROZOLE (ARIMIDEX) 1 MG TABLET    Take 1 tablet (1 mg total) by mouth daily    APOAEQUORIN (PREVAGEN PO)    Take by mouth daily    ASPIRIN 81 MG CHEWABLE TABLET    Chew 1 tablet (81 mg total) by mouth daily    CEPHALEXIN (KEFLEX) 500 MG CAPSULE    Take 1 capsule (500 mg total) by mouth 2 (two) times daily for 7 days    DESONIDE (DESOWEN) 0.05 % OINTMENT    Apply topically 2 (two) times daily    DICLOFENAC SODIUM (VOLTAREN) 1 % GEL TOPICAL GEL    Apply 2 g topically 4 (four) times daily    EZETIMIBE (ZETIA) 10 MG TABLET    Take 1 tablet (10 mg total) by mouth daily    FERROUS SULFATE (IRON PO)    Take by mouth    GABAPENTIN (NEURONTIN) 100 MG CAPSULE    Take 1 capsule (100 mg total) by mouth 2 (two) times daily as needed (Neuropathy)    GUAIFENESIN (MUCINEX) 600 MG 12 HR TABLET    Take 2 tablets (1,200 mg total) by mouth 2 (two) times daily    IBUPROFEN (ADVIL) 400 MG TABLET    Take 1 tablet (400 mg total) by mouth every 6 (six) hours as needed for Pain or Fever    LACTOBACILLUS/STREPTOCOCCUS (RISAQUAD) CAP    Take 1 capsule by mouth daily    LEVOTHYROXINE (SYNTHROID) 25 MCG TABLET    Take 1 tablet (25 mcg total) by mouth every  morning    LISINOPRIL (ZESTRIL) 40 MG TABLET    Take 1 tablet (40 mg total) by mouth daily    MAGNESIUM OXIDE (MAG-OX) 400 MG TABLET    Take 1 tablet (400 mg total) by mouth daily    MECLIZINE (ANTIVERT) 25 MG TABLET    Take 1 tablet (25 mg total) by mouth 3 (three) times daily as needed for Dizziness    MULTIPLE VITAMINS-MINERALS (MULTIVITAMIN WITH MINERALS) TABLET    Take 1 tablet by mouth every morning        PANTOPRAZOLE (PROTONIX) 40 MG TABLET    Take 1 tablet (40 mg total) by mouth every morning before breakfast    PSYLLIUM (METAMUCIL) 28 % PACKET    Take 1 packet by mouth 2 (two) times daily    ROSUVASTATIN (CRESTOR) 40 MG TABLET    Take 0.5 tablets (20 mg total) by mouth daily    VITAMIN B-1 (THIAMINE) 100 MG TABLET    Take 100 mg by mouth every morning.         REVIEW OF SYSTEMS   Review of Systems    Unable to obtain review of systems as patient is not answering questions appropriately     PHYSICAL EXAM     ED Triage Vitals [03/08/21 2050]   Enc Vitals Group      BP 185/86      Heart Rate 72      Resp Rate 16      Temp 99.2 F (37.3 C)      Temp Source Oral      SpO2 99 %      Weight 68 kg      Height 1.6 m      Head Circumference       Peak Flow       Pain Score 0      Pain Loc       Pain Edu?  Excl. in GC?      General: Well developed, well nourished female appears comfortable, in no acute distress   Head: Normocephalic, atraumatic.  Eyes: Extra-ocular motions intact. Pupils are equal and round and reactive to light bilaterally. Sclera are non-icteric and the conjunctiva are pink bilaterally.  ENT: Oropharynx clear and without edema, injection nor exudate. The uvula is midline. There is no appreciable cervical lymphadenopathy.   Respiratory: No respiratory distress. Lungs are clear to auscultation bilaterally with good air exchange.  CV: Regular rate. No murmur, gallop or rub. No edema is noted in either leg.  Abdomen: Soft, supple and not distended. No tenderness to palpation. Bowel sounds are  present and normal.   Neuro: Cranial nerves symmetric face with intermittent slurred speech. No facial droop. No pronator drift. Sensation to light touch is preserved. Normal cerebellar function, Strength equal in the upper and lower extremities bilaterally. Patient is alert and oriented to self only, not recognizing family members.   Skin: Normal color. Warm and dry.  Psych: Normal affect and thought.         MEDICAL DECISION MAKING     DISCUSSION         NIH Stroke Score    Flowsheet Row Most Recent Value   Patient's calculated Stroke Score: 1 filed at 03/08/2021 2249      Alteplase Contraindications:    Flowsheet Row Most Recent Value   Class III tPA Exclusion Criteria: do NOT give tPA    Onset time greater than 3 or 4.5 hours No   Current OR history of intracranial hemorrhage No   SBP persistently >185 or DBP >110 despite blood pressure management Yes   Suspected subarachnoid hemorrhage No   Known active internal bleeding No   Recent (within last 3 months) intracranial/intraspinal surgery OR serious head trauma OR ischemic stroke No   Intra-axial intracranial neoplasms No   History of warfarin use AND INR > 1.7 No   Class IIb tPA Exclusion Criteria: tPA may be considered only in carefully selected patients with these conditions    Baseline NIHSS > 25 (3 to 4.5 hour window) No   < 82 years old No        75 year old female with a history of breast cancer on oral chemotherapy, HTN, HLD, Hypothyroidism, and GERD, presents the emergency room with acute onset of altered mental status that started 2.5 hours ago.  Patient is not recognizing family members, is confused, is not answering questions appropriately and is having intermittent slurred speech.  No weakness, no pronator drift, no facial droop.  Concern for stroke versus TIA.  Also concern for possible hypertensive urgency versus emergency versus metabolic encephalopathy versus urosepsis as she recently had a UTI.  Plan for stroke evaluation, will consult  neurology.  Ordered CT head, CT a head and neck and CT perfusion.  Labs and EKG ordered.  Also ordered urine.      All labs and vital signs from the current visit have been reviewed and any abnormality that is present is not due to severe sepsis or septic shock.    Vital Signs: Reviewed the patient's vital signs.   Nursing Notes: Reviewed and utilized available nursing notes.  Medical Records Reviewed: Reviewed available past medical records.  Counseling: The emergency provider has spoken with the patient and discussed today's findings, in addition to providing specific details for the plan of care.  Questions are answered and there is agreement with the plan.  9:30 PM Stroke code called   9:43 PM Discussed case with Dr. Francesco Sor, neurology. He would like to be informed when patient comes back from CTA so he can do a prelim read. Based on information available, recommends offering family TPA.   9:50 PM Discussed with family at bedside regarding giving TPA. Discussed for 20 minutes, answered all questions. They wish to talk to other family memebers before making the decision. Discussed that we have to give this medication as soon as possible for the effects to work the best and that we need to give it before 11:30 PM  10:10 PM Patient is back from CT. NIH scale is 1 per nurse. Discussed with Dr. Francesco Sor, he looks at CTA and it appears negative. Still awaiting family decision.   10:29 PM family decided not to do TPA.   11:30 PM CTA head and neck is negative. CT perfusion is negative. Patient will need evaluation with MRI to confirm if there was or was not a stroke. Labs are otherwise within normal limits. UA is negative.Discussed with Dr. Craige Cotta who accepts patient for observation for MRI. Patient's blood pressure is increasing, will give labetalol.     CARDIAC STUDIES    The following cardiac studies were independently interpreted by the Emergency Medicine Physician.  For full cardiac study results please see  chart.    EKG Interpretation:  Signed and interpreted by ED Provider   Time Interpreted: 10:30 PM  Comparison: 02/23/21  Rate: 73  Rhythm: NSR  Axis: normal  Intervals: normal  Blocks: none  ST segments: no STEMI  Interpretation: Nonspecific  EKG      RADIOLOGY IMAGING STUDIES      CT Perfusion Brain   Final Result    No evidence of acute infarct or ischemia.      Aldean Ast, MD    03/08/2021 10:29 PM      CTA  Head & Neck   Final Result    No evidence of large vessel occlusion. Unremarkable CTA   brain and neck.      Aldean Ast, MD    03/08/2021 10:43 PM      CT Head WO Contrast   Final Result      No CT evidence of an acute intracranial abnormality.      Aldean Ast, MD    03/08/2021 9:54 PM      MRI Brain W WO Contrast    (Results Pending)         PULSE OXIMETRY    Oxygen Saturation by Pulse Oximetry: 98%  Interventions: none  Interpretation:  Normal     EMERGENCY DEPT. MEDICATIONS      ED Medication Orders (From admission, onward)    Start Ordered     Status Ordering Provider    03/09/21 0900 03/08/21 2334  aspirin tablet 325 mg  Daily        Route: Oral  Ordered Dose: 325 mg     Lubertha South    03/09/21 0900 03/09/21 0026    2 times daily        Route: Oral  Ordered Dose: 500 mg     Discontinued Alecia Lemming    03/09/21 0053 03/09/21 0026  cephALEXin (KEFLEX) capsule 500 mg  2 times daily        Route: Oral  Ordered Dose: 500 mg     Derrill Kay, FASIL M    03/08/21 2329 03/08/21 2328  labetalol (NORMODYNE,TRANDATE) injection 10 mg  Once  Route: Intravenous  Ordered Dose: 10 mg     Lubertha South    03/08/21 2207 03/08/21 2206  labetalol (NORMODYNE,TRANDATE) injection 10 mg  Once        Route: Intravenous  Ordered Dose: 10 mg     Acknowledged Olin Hauser    03/08/21 2201 03/08/21 2201  iohexol (OMNIPAQUE) 350 MG/ML injection 80 mL  IMG once as needed        Route: Intravenous  Ordered Dose: 80 mL     Last MAR action: Imaging Agent Given Olin Hauser    03/08/21 2159  03/08/21 2159  iohexol (OMNIPAQUE) 350 MG/ML injection 100 mL  IMG once as needed        Route: Intravenous  Ordered Dose: 100 mL     Last MAR action: Imaging Agent Given Jearld Pies M          LABORATORY RESULTS    Ordered and independently interpreted AVAILABLE laboratory tests. Please see results section in chart for full details.  Results for orders placed or performed during the hospital encounter of 03/08/21   COVID-19 (SARS-COV-2) ( Rapid)    Specimen: Nasopharynx; Nasopharyngeal Swab   Result Value Ref Range    Purpose of COVID testing Screening     SARS-CoV-2 Specimen Source Nasopharyngeal     SARS CoV-2 Negative    CBC and differential   Result Value Ref Range    WBC 12.32 (H) 3.10 - 9.50 x10 3/uL    Hgb 11.5 11.4 - 14.8 g/dL    Hematocrit 19.1 47.8 - 43.7 %    Platelets 291 142 - 346 x10 3/uL    RBC 4.22 3.90 - 5.10 x10 6/uL    MCV 88.2 78.0 - 96.0 fL    MCH 27.3 25.1 - 33.5 pg    MCHC 30.9 (L) 31.5 - 35.8 g/dL    RDW 13 11 - 15 %    MPV 11.2 8.9 - 12.5 fL    Neutrophils 84.9 None %    Lymphocytes Automated 11.0 None %    Monocytes 3.6 None %    Eosinophils Automated 0.0 None %    Basophils Automated 0.2 None %    Immature Granulocytes 0.3 None %    Nucleated RBC 0.0 0.0 - 0.0 /100 WBC    Neutrophils Absolute 10.46 (H) 1.10 - 6.33 x10 3/uL    Lymphocytes Absolute Automated 1.36 0.42 - 3.22 x10 3/uL    Monocytes Absolute Automated 0.44 0.21 - 0.85 x10 3/uL    Eosinophils Absolute Automated 0.00 0.00 - 0.44 x10 3/uL    Basophils Absolute Automated 0.02 0.00 - 0.08 x10 3/uL    Immature Granulocytes Absolute 0.04 0.00 - 0.07 x10 3/uL    Absolute NRBC 0.00 0.00 - 0.00 x10 3/uL   Basic Metabolic Panel   Result Value Ref Range    Glucose 107 (H) 70 - 100 mg/dL    BUN 15 7 - 19 mg/dL    Creatinine 0.8 0.6 - 1.0 mg/dL    Calcium 9.4 7.9 - 29.5 mg/dL    Sodium 621 308 - 657 mEq/L    Potassium 4.2 3.5 - 5.1 mEq/L    Chloride 102 100 - 111 mEq/L    CO2 25 22 - 29 mEq/L    Anion Gap 9.0 5.0 - 15.0   Hepatic  function panel (LFT)   Result Value Ref Range    Bilirubin, Total 0.4 0.2 - 1.2 mg/dL    Bilirubin Direct  0.2 0.0 - 0.5 mg/dL    Bilirubin Indirect 0.2 0.2 - 1.0 mg/dL    AST (SGOT) 24 5 - 34 U/L    ALT 19 0 - 55 U/L    Alkaline Phosphatase 78 37 - 106 U/L    Protein, Total 6.5 6.0 - 8.3 g/dL    Albumin 3.9 3.5 - 5.0 g/dL    Globulin 2.6 2.0 - 3.6 g/dL    Albumin/Globulin Ratio 1.5 0.9 - 2.2   Lipase   Result Value Ref Range    Lipase 20 8 - 78 U/L   Magnesium   Result Value Ref Range    Magnesium 2.0 1.6 - 2.6 mg/dL   Troponin I   Result Value Ref Range    Troponin I <0.01 0.00 - 0.05 ng/mL   Urinalysis Reflex to Microscopic Exam- Reflex to Culture   Result Value Ref Range    Urine Type Urine, Clean Ca     Color, UA Yellow Colorless - Yellow    Clarity, UA Clear Clear - Hazy    Specific Gravity UA 1.035 1.001 - 1.035    Urine pH 6.0 5.0 - 8.0    Leukocyte Esterase, UA Negative Negative    Nitrite, UA Negative Negative    Protein, UR 30 (A) Negative    Glucose, UA Negative Negative    Ketones UA 5 (A) Negative    Urobilinogen, UA Negative 0.2 - 2.0 mg/dL    Bilirubin, UA Negative Negative    Blood, UA Negative Negative    RBC, UA 3 - 5 0 - 5 /hpf    WBC, UA 0 - 5 0 - 5 /hpf    Squamous Epithelial Cells, Urine 0 - 5 0 - 25 /hpf   Rapid drug screen, urine   Result Value Ref Range    Urine Amphetamine Screen Negative Negative    Barbiturate Screen, UR Negative Negative    Benzodiazepine Screen, UR Negative Negative    Cannabinoid Screen, UR Negative Negative    Cocaine, UR Negative Negative    Opiate Screen, UR Negative Negative    PCP Screen, UR Negative Negative   TSH   Result Value Ref Range    TSH 0.56 0.35 - 4.94 uIU/mL   GFR   Result Value Ref Range    EGFR >60.0    Prothrombin time/INR   Result Value Ref Range    PT 11.9 10.1 - 12.9 sec    PT INR 1.0 0.9 - 1.1   APTT   Result Value Ref Range    PTT 39 27 - 39 sec   Glucose Whole Blood - POCT   Result Value Ref Range    Whole Blood Glucose POCT 117 (H) 70 - 100  mg/dL   ECG 12 lead   Result Value Ref Range    Ventricular Rate 73 BPM    Atrial Rate 73 BPM    P-R Interval 200 ms    QRS Duration 88 ms    Q-T Interval 380 ms    QTC Calculation (Bezet) 418 ms    P Axis 54 degrees    R Axis 40 degrees    T Axis 34 degrees    [facility] MUSE NARRATIVE AND IMPRESSION       NORMAL SINUS RHYTHM  POSSIBLE  LEFT ATRIAL ENLARGEMENT  BORDERLINE NORMAL/ABNORMAL ELECTROCARDIOGRAM  WHEN COMPARED WITH ECG OF  23-Feb-2021 13:15,  NO SIGNIFICANT CHANGE WAS FOUND         CRITICAL CARE/PROCEDURES  Critical Care  Performed by: Olin Hauser, MD  Authorized by: Alecia Lemming, MD     Critical care provider statement:     Critical care time (minutes):  60    Critical care was necessary to treat or prevent imminent or life-threatening deterioration of the following conditions:  CNS failure or compromise    Critical care was time spent personally by me on the following activities:  Blood draw for specimens, development of treatment plan with patient or surrogate, discussions with consultants, evaluation of patient's response to treatment, examination of patient, ordering and performing treatments and interventions, ordering and review of laboratory studies, ordering and review of radiographic studies, pulse oximetry, review of old charts and re-evaluation of patient's condition    I assumed direction of critical care for this patient from another provider in my specialty: no      Care discussed with: admitting provider          DIAGNOSIS      Diagnosis:  Final diagnoses:   Altered mental status   Hypertensive emergency       Disposition:  ED Disposition     ED Disposition   Observation    Condition   --    Date/Time   Mon Mar 08, 2021 11:33 PM    Comment   Admitting Physician: Alecia Lemming [[id number]]   Service:: Medicine [[id number]]   Estimated Length of Stay: < 2 midnights   Tentative Discharge Plan?: Home or Self Care [1]   Does patient need telemetry?: Yes   Telemetry type (separate Telemetry order is  also required):: Medical telemetry               Prescriptions:  Patient's Medications   New Prescriptions    No medications on file   Previous Medications    AMLODIPINE (NORVASC) 10 MG TABLET    Take 1 tablet (10 mg total) by mouth daily    ANASTROZOLE (ARIMIDEX) 1 MG TABLET    Take 1 tablet (1 mg total) by mouth daily    APOAEQUORIN (PREVAGEN PO)    Take by mouth daily    ASPIRIN 81 MG CHEWABLE TABLET    Chew 1 tablet (81 mg total) by mouth daily    CEPHALEXIN (KEFLEX) 500 MG CAPSULE    Take 1 capsule (500 mg total) by mouth 2 (two) times daily for 7 days    DESONIDE (DESOWEN) 0.05 % OINTMENT    Apply topically 2 (two) times daily    DICLOFENAC SODIUM (VOLTAREN) 1 % GEL TOPICAL GEL    Apply 2 g topically 4 (four) times daily    EZETIMIBE (ZETIA) 10 MG TABLET    Take 1 tablet (10 mg total) by mouth daily    FERROUS SULFATE (IRON PO)    Take by mouth    GABAPENTIN (NEURONTIN) 100 MG CAPSULE    Take 1 capsule (100 mg total) by mouth 2 (two) times daily as needed (Neuropathy)    GUAIFENESIN (MUCINEX) 600 MG 12 HR TABLET    Take 2 tablets (1,200 mg total) by mouth 2 (two) times daily    IBUPROFEN (ADVIL) 400 MG TABLET    Take 1 tablet (400 mg total) by mouth every 6 (six) hours as needed for Pain or Fever    LACTOBACILLUS/STREPTOCOCCUS (RISAQUAD) CAP    Take 1 capsule by mouth daily    LEVOTHYROXINE (SYNTHROID) 25 MCG TABLET    Take 1 tablet (25 mcg total) by mouth every morning  LISINOPRIL (ZESTRIL) 40 MG TABLET    Take 1 tablet (40 mg total) by mouth daily    MAGNESIUM OXIDE (MAG-OX) 400 MG TABLET    Take 1 tablet (400 mg total) by mouth daily    MECLIZINE (ANTIVERT) 25 MG TABLET    Take 1 tablet (25 mg total) by mouth 3 (three) times daily as needed for Dizziness    MULTIPLE VITAMINS-MINERALS (MULTIVITAMIN WITH MINERALS) TABLET    Take 1 tablet by mouth every morning        PANTOPRAZOLE (PROTONIX) 40 MG TABLET    Take 1 tablet (40 mg total) by mouth every morning before breakfast    PSYLLIUM (METAMUCIL) 28 %  PACKET    Take 1 packet by mouth 2 (two) times daily    ROSUVASTATIN (CRESTOR) 40 MG TABLET    Take 0.5 tablets (20 mg total) by mouth daily    VITAMIN B-1 (THIAMINE) 100 MG TABLET    Take 100 mg by mouth every morning.   Modified Medications    No medications on file   Discontinued Medications    No medications on file       This note was generated by the Epic EMR system/ Dragon speech recognition and may contain inherent errors or omissions not intended by the user. Grammatical errors, random word insertions, deletions and pronoun errors  are occasional consequences of this technology due to software limitations. Not all errors are caught or corrected. If there are questions or concerns about the content of this note or information contained within the body of this dictation they should be addressed directly with the author for clarification.       Olin Hauser, MD  03/09/21 0109       Olin Hauser, MD  03/09/21 0111

## 2021-03-08 NOTE — ED Notes (Signed)
Currently getting CTA

## 2021-03-08 NOTE — EDIE (Signed)
COLLECTIVE?NOTIFICATION?03/08/2021 20:38?Emily [name], [name]?MRN: [id number]    [facility]'s patient encounter information:   VWU:?[id number]  Account [id number]  Billing Account [id number]      Criteria Met      5 ED Visits in 12 Months    Security and Safety  No Security Events were found.  ED Care Guidelines  There are currently no ED Care Guidelines for this patient. Please check your facility's medical records system.  Care History  No Care Histories were found.  Flags      Negative COVID-19 Lab Result - [facility] - A specimen collected from this patient was negative for COVID-19 / Attributed By: [place] Department of Health / Attributed On: 03/06/2021     Notable Tags (groups)  No Notable Tags (Groups) were found.    Prescription Monitoring Program  000??- Narcotic Use Score  000??- Sedative Use Score  000??- Stimulant Use Score  000??- Overdose Risk Score  - All Scores range from 000-999 with 75% of the population scoring < 200 and on 1% scoring above 650  - The last digit of the narcotic, sedative, and stimulant score indicates the number of active prescriptions of that type  - Higher Use scores correlate with increased prescribers, pharmacies, mg equiv, and overlapping prescriptions  - Higher Overdose Risk Scores correlate with increased risk of unintentional overdose death   Concerning or unexpectedly high scores should prompt a review of the PMP record; this does not constitute checking PMP for prescribing purposes.  Anticoagulant Claims   No Anticoagulant Claims were found.   E.D. Visit Count (12 mo.)  Facility Visits   [place] [facility] 11   Total 11   Note: Visits indicate total known visits.     Recent Emergency Department Visit Summary  Showing 10 most recent visits out of 11 in the past 12 months   Date Facility [facility] Type Diagnoses or Chief Complaint    Mar 08, 2021  [place] [facility]  [place]  [place]  Emergency      Medic      Mar 04, 2021  [place] [facility]  [place]  [place]  Emergency      cough/body aches      Generalized Body Aches      Myalgia, unspecified site      Sciatica, left side      Contact with and (suspected) exposure to COVID-19      Urinary tract infection, site not specified      Jan 03, 2021  [place] [facility]  [place]  [place]  Emergency      pain in both legs/cough      Generalized weakness      Dizziness      Chest Pain      Weakness      Paresthesia of skin      Dizziness and giddiness      Chest pain, unspecified      Nov 24, 2020  [place] [facility]  [place]  [place]  Emergency      dizziness, tingling      dizziness, tingling on left side      dizziness, tingling on left side, bodyaches      Dizziness      Polyneuropathy, unspecified      Malignant neoplasm of unspecified site of unspecified female breast      Sep 27, 2020   [facility]  [place]  [place]  Emergency      Body Pain      Extremity Weakness      Weakness      Contact with and (suspected) exposure to covid-19      Aug 30, 2020  [place] [facility]  [place]  [place]  Emergency      headache,vomiting,back pain      Dizziness      Cough      Solitary pulmonary nodule      Pneumonia, unspecified organism      May 28, 2020  [place] [facility]  [place]  [place]  Emergency      vaginal/rectal bleeding/dizziness      Headache      Tingling      Dizziness and giddiness      May 12, 2020  [place] [facility]  [place]  [place]  Emergency      Chest Pain,Tingling,Weak      Tingling      Headache      Chest Pain      Back Pain      Chest pain, unspecified      Mastodynia      Headache, unspecified      Weakness      Paresthesia of skin      May 10, 2020  [place] [facility]  [place]  [place]  Emergency      weakness. lump by lt ear      Fatigue      Chest Pain      Generalized weakness      Pruritus, unspecified      Other chest pain      Mar 10, 2020  [place] [facility]  [place]  [place]  Emergency      abd pain      Generalized weakness      Abdominal Pain      Contact with and (suspected)  exposure to covid-19        Recent Inpatient Visit Summary  Date [facility] Type Diagnoses or Chief Complaint    Mar 10, 2020  [place] [facility] [place] [facility]  [place]  [place]  Medical Surgical      Contact with and (suspected) exposure to covid-19      Noninfective gastroenteritis and colitis, unspecified        Care Team  Provider Specialty Phone Fax Service Dates   Rondel Baton, M.D. Internal Medicine   Current    DAVIDSON, Janet Berlin MD M, MD Family Medicine: Adult Medicine [phone] Current      Collective Portal  This patient has registered at the [facility] Emergency Department   For more information visit: [email or web address]   Advance Care Plan  No Advance Care Plans found.    PLEASE NOTE:     1.   Any care recommendations and other clinical information are provided as guidelines or for historical purposes only, and providers should exercise their own clinical judgment when providing care.    2.   You may only use this information for purposes of treatment, payment or health care operations activities, and subject to the limitations of applicable [employer] Policies.    3.   You should consult directly with the organization that provided a care guideline or other clinical history with any questions about additional information or accuracy or completeness of information provided.    ? 2022  [employer] - [email or web address]

## 2021-03-09 ENCOUNTER — Observation Stay: Payer: Medicare Other

## 2021-03-09 DIAGNOSIS — R410 Disorientation, unspecified: Secondary | ICD-10-CM

## 2021-03-09 DIAGNOSIS — I161 Hypertensive emergency: Secondary | ICD-10-CM

## 2021-03-09 DIAGNOSIS — E039 Hypothyroidism, unspecified: Secondary | ICD-10-CM

## 2021-03-09 DIAGNOSIS — C50919 Malignant neoplasm of unspecified site of unspecified female breast: Secondary | ICD-10-CM

## 2021-03-09 LAB — COMPREHENSIVE METABOLIC PANEL
ALT: 17 U/L (ref 0–55)
AST (SGOT): 22 U/L (ref 5–34)
Albumin/Globulin Ratio: 1.4 (ref 0.9–2.2)
Albumin: 3.6 g/dL (ref 3.5–5.0)
Alkaline Phosphatase: 73 U/L (ref 37–106)
Anion Gap: 9 (ref 5.0–15.0)
BUN: 13 mg/dL (ref 7–19)
Bilirubin, Total: 0.5 mg/dL (ref 0.2–1.2)
CO2: 24 mEq/L (ref 22–29)
Calcium: 9.3 mg/dL (ref 7.9–10.2)
Chloride: 106 mEq/L (ref 100–111)
Creatinine: 0.8 mg/dL (ref 0.6–1.0)
Globulin: 2.5 g/dL (ref 2.0–3.6)
Glucose: 98 mg/dL (ref 70–100)
Potassium: 4.1 mEq/L (ref 3.5–5.1)
Protein, Total: 6.1 g/dL (ref 6.0–8.3)
Sodium: 139 mEq/L (ref 136–145)

## 2021-03-09 LAB — LIPID PANEL
Cholesterol / HDL Ratio: 3.2
Cholesterol: 196 mg/dL (ref 0–199)
HDL: 61 mg/dL (ref 40–9999)
LDL Calculated: 125 mg/dL — ABNORMAL HIGH (ref 0–99)
Triglycerides: 50 mg/dL (ref 34–149)
VLDL Calculated: 10 mg/dL (ref 10–40)

## 2021-03-09 LAB — CBC AND DIFFERENTIAL
Absolute NRBC: 0 10*3/uL (ref 0.00–0.00)
Basophils Absolute Automated: 0.03 10*3/uL (ref 0.00–0.08)
Basophils Automated: 0.3 %
Eosinophils Absolute Automated: 0.02 10*3/uL (ref 0.00–0.44)
Eosinophils Automated: 0.2 %
Hematocrit: 36.5 % (ref 34.7–43.7)
Hgb: 11.5 g/dL (ref 11.4–14.8)
Immature Granulocytes Absolute: 0.04 10*3/uL (ref 0.00–0.07)
Immature Granulocytes: 0.4 %
Lymphocytes Absolute Automated: 2.19 10*3/uL (ref 0.42–3.22)
Lymphocytes Automated: 19.6 %
MCH: 27 pg (ref 25.1–33.5)
MCHC: 31.5 g/dL (ref 31.5–35.8)
MCV: 85.7 fL (ref 78.0–96.0)
MPV: 11 fL (ref 8.9–12.5)
Monocytes Absolute Automated: 0.66 10*3/uL (ref 0.21–0.85)
Monocytes: 5.9 %
Neutrophils Absolute: 8.25 10*3/uL — ABNORMAL HIGH (ref 1.10–6.33)
Neutrophils: 73.6 %
Nucleated RBC: 0 /100 WBC (ref 0.0–0.0)
Platelets: 282 10*3/uL (ref 142–346)
RBC: 4.26 10*6/uL (ref 3.90–5.10)
RDW: 13 % (ref 11–15)
WBC: 11.19 10*3/uL — ABNORMAL HIGH (ref 3.10–9.50)

## 2021-03-09 LAB — GFR: EGFR: >60

## 2021-03-09 LAB — HEMOGLOBIN A1C
Average Estimated Glucose: 111.2 mg/dL
Hemoglobin A1C: 5.5 % (ref 4.6–5.9)

## 2021-03-09 LAB — AMMONIA: Ammonia: 43 umol/L (ref 18–72)

## 2021-03-09 LAB — MAGNESIUM: Magnesium: 2.1 mg/dL (ref 1.6–2.6)

## 2021-03-09 LAB — VITAMIN B12: Vitamin B-12: 1837 pg/mL — ABNORMAL HIGH (ref 211–911)

## 2021-03-09 MED ORDER — ANASTROZOLE 1 MG PO TABS
1.0000 mg | ORAL_TABLET | Freq: Every morning | ORAL | Status: DC
  Administered 2021-03-09 – 2021-03-10 (×2): 1 mg via ORAL
  Filled 2021-03-09 (×2): qty 1

## 2021-03-09 MED ORDER — GLUCAGON 1 MG IJ SOLR (WRAP)
1.0000 mg | INTRAMUSCULAR | Status: DC | PRN
Start: 2021-03-09 — End: 2021-03-10

## 2021-03-09 MED ORDER — SODIUM CHLORIDE 0.9 % IV SOLN
INTRAVENOUS | Status: AC
Start: 2021-03-09 — End: 2021-03-09

## 2021-03-09 MED ORDER — DEXTROSE 50 % IV SOLN
25.0000 g | INTRAVENOUS | Status: DC | PRN
Start: 2021-03-09 — End: 2021-03-10

## 2021-03-09 MED ORDER — CEPHALEXIN 500 MG PO CAPS
500.0000 mg | ORAL_CAPSULE | Freq: Two times a day (BID) | ORAL | Status: DC
Start: 2021-03-09 — End: 2021-03-10
  Administered 2021-03-09 – 2021-03-10 (×4): 500 mg via ORAL
  Filled 2021-03-09 (×4): qty 1

## 2021-03-09 MED ORDER — GADOTERATE MEGLUMINE 7.5 MMOL/15ML IV SOLN (CLARISCAN)
14.0000 mL | Freq: Once | INTRAVENOUS | Status: AC | PRN
Start: 2021-03-09 — End: 2021-03-09
  Administered 2021-03-09: 14 mL via INTRAVENOUS

## 2021-03-09 MED ORDER — NALOXONE HCL 0.4 MG/ML IJ SOLN (WRAP)
0.2000 mg | INTRAMUSCULAR | Status: DC | PRN
Start: 2021-03-09 — End: 2021-03-10

## 2021-03-09 MED ORDER — EZETIMIBE 10 MG PO TABS
10.0000 mg | ORAL_TABLET | Freq: Every day | ORAL | Status: DC
Start: 2021-03-09 — End: 2021-03-10
  Administered 2021-03-09: 10 mg via ORAL
  Filled 2021-03-09: qty 1

## 2021-03-09 MED ORDER — MELATONIN 3 MG PO TABS
3.0000 mg | ORAL_TABLET | Freq: Every evening | ORAL | Status: DC | PRN
Start: 2021-03-09 — End: 2021-03-10

## 2021-03-09 MED ORDER — CEPHALEXIN 250 MG PO CAPS
500.0000 mg | ORAL_CAPSULE | Freq: Two times a day (BID) | ORAL | Status: DC
Start: 2021-03-09 — End: 2021-03-09

## 2021-03-09 MED ORDER — LISINOPRIL 20 MG PO TABS
40.0000 mg | ORAL_TABLET | Freq: Every day | ORAL | Status: DC
Start: 2021-03-09 — End: 2021-03-10
  Administered 2021-03-09 – 2021-03-10 (×2): 40 mg via ORAL
  Filled 2021-03-09 (×2): qty 2

## 2021-03-09 MED ORDER — THIAMINE (VITAMIN B1) 100 MG PO TABS (WRAP)
100.0000 mg | ORAL_TABLET | Freq: Every morning | ORAL | Status: DC
Start: 2021-03-09 — End: 2021-03-10
  Administered 2021-03-09 – 2021-03-10 (×2): 100 mg via ORAL
  Filled 2021-03-09 (×2): qty 1

## 2021-03-09 MED ORDER — HEPARIN SODIUM (PORCINE) 5000 UNIT/ML IJ SOLN
5000.0000 [IU] | Freq: Three times a day (TID) | INTRAMUSCULAR | Status: DC
Start: 2021-03-09 — End: 2021-03-10
  Administered 2021-03-09 – 2021-03-10 (×5): 5000 [IU] via SUBCUTANEOUS
  Filled 2021-03-09 (×5): qty 1

## 2021-03-09 MED ORDER — ACETAMINOPHEN 325 MG PO TABS
650.0000 mg | ORAL_TABLET | Freq: Four times a day (QID) | ORAL | Status: DC | PRN
Start: 2021-03-09 — End: 2021-03-10
  Administered 2021-03-09: 650 mg via ORAL
  Filled 2021-03-09: qty 2

## 2021-03-09 MED ORDER — PANTOPRAZOLE SODIUM 40 MG PO TBEC
40.0000 mg | DELAYED_RELEASE_TABLET | Freq: Every morning | ORAL | Status: DC
Start: 2021-03-09 — End: 2021-03-10
  Administered 2021-03-09 – 2021-03-10 (×2): 40 mg via ORAL
  Filled 2021-03-09 (×2): qty 1

## 2021-03-09 MED ORDER — DEXTROSE 5% IV BOLUS
250.0000 mL | INTRAVENOUS | Status: DC | PRN
Start: 2021-03-09 — End: 2021-03-10

## 2021-03-09 MED ORDER — AMLODIPINE BESYLATE 5 MG PO TABS
10.0000 mg | ORAL_TABLET | Freq: Every day | ORAL | Status: DC
Start: 2021-03-09 — End: 2021-03-10
  Administered 2021-03-09 – 2021-03-10 (×2): 10 mg via ORAL
  Filled 2021-03-09 (×2): qty 2

## 2021-03-09 MED ORDER — LEVOTHYROXINE SODIUM 25 MCG PO TABS
25.0000 ug | ORAL_TABLET | Freq: Every morning | ORAL | Status: DC
Start: 2021-03-09 — End: 2021-03-10
  Administered 2021-03-09 – 2021-03-10 (×2): 25 ug via ORAL
  Filled 2021-03-09 (×2): qty 1

## 2021-03-09 MED ORDER — DEXTROSE 10 % IV BOLUS
25.0000 g | INTRAVENOUS | Status: DC | PRN
Start: 2021-03-09 — End: 2021-03-10

## 2021-03-09 MED ORDER — ROSUVASTATIN CALCIUM 10 MG PO TABS
20.0000 mg | ORAL_TABLET | Freq: Every day | ORAL | Status: DC
Start: 2021-03-09 — End: 2021-03-10
  Administered 2021-03-09: 20 mg via ORAL
  Filled 2021-03-09: qty 2

## 2021-03-09 NOTE — Plan of Care (Signed)
Problem: Moderate/High Fall Risk Score >5  Goal: Patient will remain free of falls  Outcome: Progressing     Problem: Safety  Goal: Patient will be free from injury during hospitalization  Outcome: Progressing  Goal: Patient will be free from infection during hospitalization  Outcome: Progressing     Problem: Pain  Goal: Pain at adequate level as identified by patient  Outcome: Progressing     Problem: Side Effects from Pain Analgesia  Goal: Patient will experience minimal side effects of analgesic therapy  Outcome: Progressing     Problem: Discharge Barriers  Goal: Patient will be discharged home or other facility with appropriate resources  Outcome: Progressing     Problem: Psychosocial and Spiritual Needs  Goal: Demonstrates ability to cope with hospitalization/illness  Outcome: Progressing

## 2021-03-09 NOTE — UM Notes (Signed)
6/6      75 y.o. female with a PMHx of hypothyroidism, history of breast cancer status post right mastectomy, hypertension, hyperlipidemia mated on 09/27/2020 with left-sided weakness/numbness and had a negative work-up including MRI presenting with altered mental status    BP 185/86      Heart Rate 72      Resp Rate 16      Temp 99.2 F (37.3 C)      Temp Source Oral      SpO2 99 %     Neuro: Cranial nerves symmetric face with intermittent slurred speech. No facial droop. No pronator drift. Sensation to light touch is preserved. Normal cerebellar function, Strength equal in the upper and lower extremities bilaterally. Patient is alert and oriented to self only, not recognizing family members    NIH Stroke Score -1    CTA HEAD (-)    CT PERFUSION BRAIN (-)    CT HEAD (-)    LAB- WNL    EKG- NSR, LAE    Admit to Observation (Order [id number])  Admission  Date: 03/08/2021 Department: [facility] Ordering/Authorizing: Olin Hauser, MD     Active Hospital Problems    Diagnosis   . Altered mental status     Plan:   -Admit to [employer] service    1. Altered mental status  -Presented with altered mental status.  Currently alert and oriented  -No focal neurologic deficit on exam   -CT head negative for acute process  -CT perfusion and CTA head and neck unremarkable  -Continue neuro check every 4 hour.  Swallow screen  -Etiology unclear  -MRI brain pending  -Continue aspirin and statin    2. Hypertension, poorly controlled  -Continue amlodipine and lisinopril  -Monitor blood pressure    3. Hypothyroidism  -On levothyroxine    4. History of breast cancer  - Status post right mastectomy  -Continue anastrozole    5. Hyperlipidemia  -On Crestor    6.  Recent urinary tract infection  -Diagnosed with UTI on 6/02 and currently on Keflex to be completed on 6/09  -UA today is negative for nitrite and leukocyte esterase          Nutrition:No diet orders on file    Code status: Full code        Status/Disposition:   Pt is admitted under OBSERVATION with altered mental status  Anticipated medical stability for discharge: 24 Hrs               Concern for stroke versus TIA.  Also concern for possible hypertensive urgency versus emergency versus metabolic encephalopathy versus urosepsis as she recently had a UTI

## 2021-03-09 NOTE — ED Notes (Signed)
[facility]  ED NURSING NOTE FOR THE RECEIVING INPATIENT NURSE   ED NURSE Emily [name] [phone]   ED CHARGE RN Emily [name]   ADMISSION INFORMATION   Emily [name] is a 76 y.o. female admitted with an ED diagnosis of:    1. Altered mental status         Isolation: None   Allergies: Morphine and Morphine and related   Holding Orders confirmed? Yes   Belongings Documented? Yes   Home medications sent to pharmacy confirmed? N/A   NURSING CARE   Patient Comes From:   Mental Status: Home Independent  confused   ADL: Needs assistance with ADLs   Ambulation: no difficulty   Pertinent Information  and Safety Concerns: NIH of 1, pt is ambulatory. Oriented to self but confused on time and where she is.      CT / NIH   CT Head ordered on this patient?  Yes   NIH/Dysphagia assessment done prior to admission? Yes   VITAL SIGNS (at the time of this note)      Vitals:    03/09/21 0053   BP: 169/74   Pulse: 78   Resp: 18   Temp: 98.9 F (37.2 C)   SpO2: 98%

## 2021-03-09 NOTE — Progress Notes (Signed)
Pt admitted to unit at 0145 from ED via stretcher. Pt is A&OX4. NIH complete. Scores 0. VSS. SBP 170's. Permissive HTN ordered. Pt in no apparent distress. Pt complains of moderate left shoulder pain. Tylenol given with good effect. MRI screening complete. Will continue to monitor pt closely.

## 2021-03-09 NOTE — PT Eval Note (Signed)
[facility]  [address]  [place], [place] [address]  [phone]    Physical Therapy Evaluation    Patient: Emily [name] MRN: [id number]   Unit: [facility] INTERMEDIATE CARE Bed: MI628/MI628-02    Time of Treatment: Time Calculation  PT Received On: 03/09/21  Start Time: 1450  Stop Time: 1505  Time Calculation (min): 15 min    Consult received for Emily [name] for PT evaluation and treatment.  Patient's medical condition is appropriate for Physical Therapy  intervention at this time.    Interpreter utilized: no, not indicated      D/C Suggestions   Based on today's performance:  Recommendation  Discharge Recommendation: Home with supervision  DME Recommended for Discharge: No additional equipment/DME recommended at this time  PT Frequency: 2-3x/wk.      Transport Recommendations: no limitations    Discharge recommendations are based on patient's progression/regression. Please see most recent note for updated discharge recommendations.    Assessment     Emily [name] is a 75 y.o. female admitted 03/08/2021  from home secondary to altered mental status. Pt has a PMH significant for hypothyroidism, history of breast cancer status post right mastectomy, hypertension, and hyperlipidemia. At baseline, pt reports she lives alone in an apartment on the 2nd floor; 2 FOS with HR. Bathroom set-up includes a tub/shower combination with a HHSH. At [facility], pt reports being completely indep with ADLs/IADLs, mobility and was still driving.  Pt was received supine in bed in no distress. Pt required supervision for bed mobility and transfers. Pt ambulated with SBA and HHA initially with mild gait deviations but progressed to supervision for gait. Pt with poor insight/judgement and would benefit from supervision at home.       Pt's functional mobility is impacted by:  decreased activity tolerance, decreased balance, decreased bed mobility, confusion, gait impairment, decreased insight, decreased judgment, decreased safety  awareness, pain, decreased problem solving, decreased strength and transfers .  There are a few comorbidities or other factors that affect plan of care and require modification of task including: has stairs to manage and lives alone.  Standardized tests and exams incorporated into evaluation include balance, cognition/orientation, coordination, ROM  and Strength.  Pt demonstrates a stable clinical presentation.   Pt would continue to benefit from PT to address these deficits and increase functional independence.     Complexity Level Hx and Co  morbidites Examination Clinical Decision Making Clinical Presentation   Low  no impact 1-2 elements Limited options Stable       PMP - Progressive Mobility Protocol   PMP Activity: Step 7 - Walks out of Room  Distance Walked (ft) (Step 6,7): 300 Feet     Rehabilitation Potential: good for goals       Interdisciplinary Communication: Discussed with RN/OT/MD      Plan     Plan  Risks/Benefits/POC Discussed with Pt/Family: With patient  Treatment/Interventions: Exercise;Gait training;Stair training;Bed mobility;Functional transfer training;LE strengthening/ROM;Cognitive reorientation;Endurance training  PT Frequency: 2-3x/wk         Medical Diagnosis: Altered mental status [R41.82]  Hypertensive emergency [I16.1]    History of Present Illness: Emily [name] is a 75 y.o. female admitted on  03/08/2021 "because she was found to be confused disoriented by her neighbor, she was last normal at around 7 PM, 2 and half hours prior to arrival to the ED.  She was witnessed by the neighbor as being her usual self, and that at 7:30 PM she was  not acting appropriately, the neighbor called the son who brought the patient to the ED tPA was offered, but declined"      Patient Active Problem List   Diagnosis   . Malignant neoplasm of overlapping sites of right female breast   . Hypertension   . Mixed hyperlipidemia   . Hypothyroid   . Right sided numbness   . Chest discomfort   . SOB (shortness of  breath)   . Left sided numbness   . Left-sided weakness   . COVID-19 ruled out by laboratory testing   . Acute colitis   . Dizziness   . Right lower quadrant abdominal pain   . Abnormal CT scan, gastrointestinal tract   . History of hypothyroidism   . History of breast cancer   . Altered mental status     Past Medical History:   Diagnosis Date   . Breast cancer 2014    right breast mastectomy   . Exercise-induced angina     negative work up   . GERD (gastroesophageal reflux disease)    . Headache     resolved   . Hyperlipidemia    . Hypertension    . Hypothyroidism    . Low back pain    . Malignant neoplasm of overlapping sites of right female breast 10/19/2015   . Syncope and collapse     Chronic Vertigo   . Vertigo      Past Surgical History:   Procedure Laterality Date   . COLONOSCOPY  2021   . COLONOSCOPY, POLYPECTOMY N/A 07/21/2020    Procedure: COLONOSCOPY, POLYPECTOMY;  Surgeon: Einar Grad, MD;  Location: [facility];  Service: Gastroenterology;  Laterality: N/A;   . EGD, BIOPSY N/A 07/21/2020    Procedure: EGD, BIOPSY;  Surgeon: Einar Grad, MD;  Location: [facility];  Service: Gastroenterology;  Laterality: N/A;   . HYSTERECTOMY     . MASTECTOMY Right 2015       X-Rays/Tests/Labs:  Lab Results   Component Value Date/Time    HGB 11.5 03/09/2021 05:54 AM    HCT 36.5 03/09/2021 05:54 AM    K 4.1 03/09/2021 05:54 AM    NA 139 03/09/2021 05:54 AM    INR 1.0 03/08/2021 10:15 PM    TROPI <0.01 03/08/2021 10:15 PM    TROPI <0.01 01/03/2021 02:01 PM    TROPI <0.01 09/27/2020 01:47 PM    TROPI <0.01 08/30/2020 12:08 PM    TROPI 0.01 07/15/2009 09:40 AM     MRI Brain W WO Contrast (Order: [id number]) - 03/08/2021  IMPRESSION:    Findings suggestive of mild microvascular ischemic changes.  No significant change in comparison with MRI done on 09/27/2020.    CT Perfusion Brain (Order: [id number]) - 03/08/2021  IMPRESSION:   No evidence of acute infarct or ischemia.    CTA Head & Neck (Order: [id number]) -  03/08/2021  IMPRESSION:   No evidence of large vessel occlusion. Unremarkable CTA  brain and neck.    CT Head WO Contrast (Order: [id number]) - 03/08/2021  IMPRESSION:   No CT evidence of an acute intracranial abnormality.    Social History:  Lives alone in an apartment on the 2nd floor.  Entry Steps: none   Rails: none          Inside steps: 2 FOS  Rails: HR  Equipment at home:  cane, RW, tub/shower combination, hand held shower head and blood pressure monitor  Prior Level of Function:  Cognition: WFL                         Mobility: indep              Feeding: indep              Grooming: indep              Bathing: indep              Dressing: indep              Toileting: indep     Subjective   Patient is agreeable to participation in the therapy session. Nursing clears patient for therapy.  Patient's Goal:  None stated  Pain: Pt unable to quantify  Location: B knee pain  Therapist Intervention: positioned for comfort  Patient is satisfied with therapist intervention.    Objective     Precautions/ Contraindications:   Precautions  Weight Bearing Status: no restrictions  Other Precautions: fall risk    Patient is in bed with  Telemetry and Intravenous Access in place.        Observation of patient/vitals:      Vitals:    03/09/21 0400 03/09/21 0845 03/09/21 0928 03/09/21 1200   BP: 169/73 168/72 155/62 153/67   Pulse: (Abnormal) 57 (Abnormal) 55 (Abnormal) 56 62   Resp:    18   Temp: 98.4 F (36.9 C) 98.6 F (37 C)  99.1 F (37.3 C)   TempSrc: Oral Oral  Oral   SpO2: 99% 100% 99% 96%   Weight:       Height:           Orientation/Cognition:  Alert and Oriented x 2; not oriented to month/year or situation  Cognition: follows commands well but poor insight/safety/ judgement    Musculoskeletal Examination:      ROM Strength   RLE Grossly WFL Grossly 4/5   LLE Grossly WFL Grossly 4/5       Sensation: Intact to light touch, denies numbness/tingling to BLEs  Coordination: fair      Functional  Mobility:  Rolling: supervision    Supine to sit: supervision  Scooting: supervision  Sit to Supine: supervision  Sit to stand: supervision  Stand to sit: supervision  Transfers: on off bed  Ambulation:     Weightbearing: no restrictions   Assistance level: SBA   Distance: 300'   Assistive Device: HHA and none   Gait Deviations: dec cadence, increased BOS, mild veering to start but improved with distance   Stairs: deferred this session     Balance:  Static Sit: good  Dynamic Sit: fair+  Static Stand: fair+  Dynamic Stand: fair+    Endurance: fair+    Participation:  fair    Education:  Educated the patient to role of physical therapy, plan of care, goals  of therapy and HEP, safety with mobility and ADLs.    Patient is in bed with all needs met, and equipment intact.  RN notified of session outcome.  Safety measures include: handoff to nurse/clin tech/ unit secretary completed, bed alarm activated, oriented to call bell and placed within reach, personal items within reach, assistive device positioned out of reach and bed placed in lowest position.  Mobility status posted at bedside and within E.M.R.               Goals  Goal Formulation: With patient  Time for Goal Acheivement: 3  visits  Goals: Select goal  Pt Will Go Supine To Sit: modified independent  Pt Will Perform Sit to Stand: modified independent  Pt Will Transfer Bed/Chair: modified independent  Pt Will Ambulate: modified independent;> 200 feet  Pt Will Go Up / Down Stairs: 1 flight;with supervision;With rail       Therapist PPE during session procedural mask, goggles  and gloves       Signature:  Sedonia Small, PT  03/09/2021  4:07 PM  Phone: [phone]     (For scheduling questions, please contact rehab tech [phone])      Attention MD:   Thank you for allowing [place] to participate in the care of [facility]. Regulations from the Center for Medicare and Medicaid Services (CMS) require your review and approval of this plan of care.     Please cosign this note indicating  you are in agreement with the Therapy Plan of Care so we may initiate the therapy treatment plan.

## 2021-03-09 NOTE — Progress Notes (Signed)
Technician from CT scan brought an earring and necklace for patient that she left is yesterday, jewelries given to patient at the bedside

## 2021-03-09 NOTE — Progress Notes (Signed)
MEDICINE PROGRESS NOTE  [facility], DIVISION OF HOSPITALIST MEDICINE   [facility]   [place] Pager: [phone]      Date Time: 03/09/21 4:16 PM  Patient Name: Emily [name]  Attending Physician: Mahlon Gammon, [facility] Day: 2  Assessment:     Active Hospital Problems    Diagnosis   . Altered mental status   . History of hypothyroidism   . History of breast cancer   . Mixed hyperlipidemia   . Hypertension     75 y.o. female with a PMHx of hypothyroidism, history of breast cancer status post right mastectomy, hypertension, hyperlipidemia found confused at home by neighbors.  Work-up in the emergency room showed unremarkable CT of head CT angio of head and neck.  MRI of the brain was unremarkable as well      Today she is complaining of headache and dizziness, speech  is clear, complains of lower extremity weakness  Plan:   Confusion episode  -Could be due to seizure or transient global amnesia  -Neurology note and recommendation appreciated  -has normal  TSH    - f/u vitamn B12, vitamin D level  -Follow-up with EEG  -Recent urinary tract infection  -Follow-up with repeat urinalysis and urine culture  -Continue aspirin and statin    History of hypothyroidism  -Continue Synthroid    History of breast cancer  - Status post right mastectomy  -Continue anastrozole    Debility  -PT OT      Case discussed with:Pt, staff , niece     Safety Checklist:     DVT prophylaxis:  CHEST guideline (See page [id number]) Chemical   Foley:   Rn Foley protocol Not present   IVs:  None   PT/OT: Ordered     Lines:     Patient Lines/Drains/Airways Status     Active PICC Line / CVC Line / PIV Line / Drain / Airway / Intraosseous Line / Epidural Line / ART Line / Line / Wound / Pressure Ulcer / NG/OG Tube     Name Placement date Placement time Site Days    Peripheral IV 03/08/21 20 G Left Antecubital 03/08/21  2140  Antecubital  less than 1                 Disposition:     Today's date: 03/09/2021  Length of  Stay: 0  Anticipated medical stability for discharge: per Medical improvement  Reason for ongoing hospitalization: AMS    Subjective     CC: Altered mental status    Diet heart healthy      Review of Systems:   Review of Systems - Negative except as above in HPI    Physical Exam:     Temp:  [98.4 F (36.9 C)-99.2 F (37.3 C)] 99.1 F (37.3 C)  Heart Rate:  [55-78] 62  Resp Rate:  [16-58] 18  BP: (153-186)/(62-86) 153/67  Intake and Output Summary-last 24 Hrs:  No intake/output data recorded.    No data recorded by O2 Device: None (Room air)     General: awake, alert ,in no acute distress  Cardiovascular: regular rate and rhythm, no murmurs  Lungs: clear to auscultation bilaterally, no additional sounds  Abdomen: soft, non-tender, non-distended; normoactive bowel sounds  Extremities: no edema  Neurological: Alert and oriented X 3, moves all extremities.   Skin: no rashes         Meds:   Medications were reviewed:  Scheduled Meds:  Current Facility-Administered Medications   Medication Dose Route Frequency   . amLODIPine  10 mg Oral Daily   . anastrozole  1 mg Oral QAM   . cephalexin  500 mg Oral BID   . ezetimibe  10 mg Oral Daily   . heparin (porcine)  5,000 Units Subcutaneous Q8H SCH   . labetalol  10 mg Intravenous Once   . labetalol  10 mg Intravenous Once   . levothyroxine  25 mcg Oral QAM   . lisinopril  40 mg Oral Daily   . pantoprazole  40 mg Oral QAM AC   . rosuvastatin  20 mg Oral Daily   . thiamine  100 mg Oral QAM     Continuous Infusions:  PRN Meds:.acetaminophen, Nursing communication: Adult Hypoglycemia Treatment Algorithm **AND** glucagon (rDNA) **AND** dextrose **AND** dextrose **AND** dextrose, melatonin, naloxone  Labs/Radiology:   Imaging personally reviewed, including: all available   CTA  Head & Neck    Result Date: 03/08/2021   No evidence of large vessel occlusion. Unremarkable CTA brain and neck. Aldean Ast, MD  03/08/2021 10:43 PM    CT Head WO Contrast    Result Date: 03/08/2021  No CT evidence  of an acute intracranial abnormality. Aldean Ast, MD  03/08/2021 9:54 PM    CT Perfusion Brain    Result Date: 03/08/2021   No evidence of acute infarct or ischemia. Aldean Ast, MD  03/08/2021 10:29 PM    MRI Brain W WO Contrast    Result Date: 03/09/2021   Findings suggestive of mild microvascular ischemic changes. No significant change in comparison with MRI done on 09/27/2020. Merri Ray, MD  03/09/2021 1:38 PM    Recent Labs     03/08/21  2133   Whole Blood Glucose POCT 117*     Recent Labs   Lab 03/09/21  0554 03/08/21  2215   Sodium 139 136   Potassium 4.1 4.2   Chloride 106 102   BUN 13 15   Creatinine 0.8 0.8   EGFR >60.0 >60.0   Glucose 98 107*   Calcium 9.3 9.4     Recent Labs   Lab 03/09/21  0554 03/08/21  2215   WBC 11.19* 12.32*   Hgb 11.5 11.5   Hematocrit 36.5 37.2   Platelets 282 291     Recent Labs   Lab 03/08/21  2215   PT 11.9   PT INR 1.0   PTT 39     Recent Labs   Lab 03/09/21  0554 03/08/21  2215   Alkaline Phosphatase 73 78   Bilirubin, Total 0.5 0.4   Bilirubin Direct  --  0.2   ALT 17 19   AST (SGOT) 22 24             Signed by: Yates Decamp, MD

## 2021-03-09 NOTE — Progress Notes (Signed)
Routine EEG Done ,Report will follow

## 2021-03-09 NOTE — SLP Progress Note (Signed)
Speech Language Pathology    Consult received/chart review completed. Consult received per stroke protocol as patient admitted with transient AMS and dysarthria. Patient's cognitive communicative functioning has returned to baseline per chart review; most recent NIHSS. Passed RN dysphagia screen. No SLP intervention indicated at this time.     Gaylan Gerold, M.Ed CCC-SLP  03/09/2021  Phone: [phone]

## 2021-03-09 NOTE — PT Progress Note (Signed)
Physical Therapy Cancellation Note    Patient: Emily [name]  ZOX:[id number]    Unit: MI628/MI628-02    Patient not seen for physical therapy secondary to pt off floor for MRI.  Will follow later as schedule permits.  Doristine Counter, PT

## 2021-03-09 NOTE — Consults (Addendum)
NEUROLOGY CONSULTATION    Date Time: 03/09/21 1:46 PM  Patient Name: Emily [name]  Attending Physician: Mahlon Gammon, Majid,*      Assessment & Plan:   Episode of altered mental status encephalopathy confused behavior, currently doing much better, could be due to UTI, not entirely sure, especially given negative brain MRI?  Transient global amnesia event complex partial seizure   Check nutritional markers   continue treating UTI   Given brain MRI negative do not feel strongly about further neuro assessment   Given UA is negative we will get inpatient EEG      History of Present Illness:   75 year old lady, comes into the hospital because she was found to be confused disoriented by her neighbor, she was last normal at around 7 PM, 2 and half hours prior to arrival to the ED.  She was witnessed by the neighbor as being her usual self, and that at 7:30 PM she was not acting appropriately, the neighbor called the son who brought the patient to the ED tPA was offered, but declined    CT angios CT perfusion studies were done which were negative  Brain MRI is negative as well  Urine analysis looks negative      Recent Labs   Lab 03/09/21  0554   Hemoglobin A1C 5.5     Recent Labs   Lab 03/09/21  0554   Cholesterol 196   Triglycerides 50   HDL 61   LDL Calculated 125*     Recent Labs   Lab 03/09/21  0554   LDL Calculated 125*        Past Medical History:     Past Medical History:   Diagnosis Date   . Breast cancer 2014    right breast mastectomy   . Exercise-induced angina     negative work up   . GERD (gastroesophageal reflux disease)    . Headache     resolved   . Hyperlipidemia    . Hypertension    . Hypothyroidism    . Low back pain    . Malignant neoplasm of overlapping sites of right female breast 10/19/2015   . Syncope and collapse     Chronic Vertigo   . Vertigo        Meds:   Norvasc Synthroid aspirin gabapentin full code    Allergies   Allergen Reactions   . Morphine Itching   . Morphine And Related Itching  and Nausea And Vomiting     dizzy       Social & Family History:     Social History     Socioeconomic History   . Marital status: Widowed   Tobacco Use   . Smoking status: Never Smoker   . Smokeless tobacco: Never Used   Vaping Use   . Vaping Use: Never used   Substance and Sexual Activity   . Alcohol use: Yes     Alcohol/week: 1.0 standard drink     Types: 1 Cans of beer per week     Comment: socially   . Drug use: No   . Sexual activity: Not Currently       Family History   Problem Relation Age of Onset   . Breast cancer Neg Hx            CODE STATUS: FULL   I personally reviewed all of the medications.  Medication list generated using all available resources.  Elder abuse (physical)  - negative  Advanced care plan - reviewed from chart or in discussion with pt or family    Review of Systems:    Pt too cognitively or communication impaired to participate in ROS.    Physical Exam:   Blood pressure 153/67, pulse 62, temperature 99.1 F (37.3 C), temperature source Oral, resp. rate 18, height 1.6 m (5\' 3" ), weight 69.5 kg (153 lb 3.2 oz), SpO2 96 %.    HEENT: Normocephalic.no carotid bruits  Lungs:  CTA bil  Abd Soft   Cardiac:  S1,S2, normal rate and rhythm  Neck: supple, no cartoid bruits  Extremities: no edema  Skin: no rashes seen in exposed areas     Neuro:  Level of consciousness: Awake and alert, but somewhat sleepy looking, somewhat slow to answer simple questions oriented to [facility], could not tell me the month, knows the year is 2022 follows all commands smile symmetric tongue is midline pupils are small reactive is moving both upper and lower extremities well from the no clear-cut motor deficits appreciated other than slight mental slowing   Labs:     Recent Labs   Lab 03/09/21  0554 03/08/21  2215   Glucose 98 107*   BUN 13 15   Creatinine 0.8 0.8   Calcium 9.3 9.4   Sodium 139 136   Potassium 4.1 4.2   Chloride 106 102   CO2 24 25   Albumin 3.6 3.9   Magnesium 2.1 2.0   AST (SGOT) 22 24    ALT 17 19   Bilirubin, Total 0.5 0.4   Alkaline Phosphatase 73 78     Recent Labs   Lab 03/09/21  0554 03/08/21  2215   WBC 11.19* 12.32*   Hgb 11.5 11.5   Hematocrit 36.5 37.2   MCV 85.7 88.2   MCH 27.0 27.3   MCHC 31.5 30.9*   Platelets 282 291         Recent Labs     03/08/21  2215   PTT 39   PT 11.9   PT INR 1.0          Radiology Results (24 Hour)     Procedure Component Value Units Date/Time    MRI Brain W WO Contrast [[id number]] Collected: 03/09/21 1331    Order Status: Completed Updated: 03/09/21 1340    Narrative:      INDICATION: Left-sided weakness.     TECHNIQUE: Axial T1-weighted, FLAIR, T2*, and  T2-weighted images of the  brain were obtained along with sagittal T1-weighted images. An axial  diffusion-weighted sequence was obtained along with an ADC map.  Following  the administration of 14 cc of gadolinium, T1-weighted axial  and coronal images were obtained.    FINDINGS: No intracranial mass lesion, hemorrhage, or territorial  infarction is demonstrated. The ventricular system and subarachnoid  spaces are within normal limits for the patient's age. There are small  foci of hyperintensity on long TR images in the periventricular and deep  white matter suggestive of mild microvascular ischemic changes.  Diffusion-weighted images show no regions of restricted diffusion.   Following contrast administration, no abnormal enhancement is noted. The  visualized portions of the sinuses and mastoid air cells are clear.      Impression:       Findings suggestive of mild microvascular ischemic changes.  No significant change in comparison with MRI done on 09/27/2020.    Merri Ray, MD   03/09/2021 1:38 PM    CTA  Head & Neck [  [id number]] Collected: 03/08/21 2237    Order Status: Completed Updated: 03/08/21 2246    Narrative:      INDICATION: Neuro deficit.    TECHNIQUE:   CTA: CT angiography of the head and neck was performed from the vertex  to the aortic arch. Sagittal and coronal reformatted MIP images  were  created on an independent workstation.  80 mL of Omnipaque 350 was  administered.    COMPARISON: 01/03/2021.    FINDINGS:     No significant stenosis is visualized within major branch vessels of the  circle of Willis.  No intracranial aneurysm or vascular malformation is  identified.    No significant stenosis is visualized within major vessels of the neck.      Impression:       No evidence of large vessel occlusion. Unremarkable CTA  brain and neck.    Aldean Ast, MD   03/08/2021 10:43 PM    CT Perfusion Brain [[id number]] Collected: 03/08/21 2227    Order Status: Completed Updated: 03/08/21 2231    Narrative:      CT ANGIOGRAM CEREBRAL PERFUSION W 3D RECONSTRUCTION 03/08/2021 9:50 PM    Clinical history: Confusion slurring    Comparison: none available.    Contrast dose: Omnipaque 350, 40 mL    The following ?dose reduction techniques were utilized: automated  exposure control and/or adjustment of the mA and/or kV according  to patient size, and the use of iterative reconstruction technique.    TECHNIQUE:  Computed tomography of the brain was obtained after the  intra venous administration of contrast using cerebral perfusion  protocol. Postprocessing parametric images include cerebral blood flow,  cerebral blood volume and mean transit times maps were reviewed. This  scan was analyzed the using Viz ContaCT artificial intelligence  algorithm for LVO detection and computer aided triage.  FINDINGS:     Mean transit time: Unremarkable, no delayed perfusion.    Cerebral blood flow: Unremarkable, no hypoperfusion.    Cerebral blood volume: Unremarkable, no hypoperfusion.    Tmax > 6 s volume: 0 ml.  CBF< 30 percent volume: 0 ml.  Mismatch volume: 0 ml.  Mismatch ratio: 0.N/A      Impression:       No evidence of acute infarct or ischemia.    Aldean Ast, MD   03/08/2021 10:29 PM    CT Head WO Contrast [[id number]] Collected: 03/08/21 2149    Order Status: Completed Updated: 03/08/21 2156    Narrative:      CT  HEAD    CLINICAL STATEMENT: Neuro deficit, acute, stroke suspected    COMPARISON: 01/03/2021    TECHNIQUE: Multiple 5 mm thickness axial images were obtained from the  skull base to the vertex without IV contrast administration. Coronal and  sagittal reformatted images were then obtained.    Note that CT scanning at this site utilizes multiple dose reduction  techniques including automatic exposure control, adjustment of the MAS  and/or KVP according to patient size, and use of iterative  reconstruction technique.    FINDINGS: Gray-white matter junction differentiation is appropriate. No  intracranial hemorrhage is identified. The ventricles are appropriate in  size, shape and are midline. There is no intracranial mass, mass-effect  or midline shift. No abnormal intra or extra-axial fluid collections are  identified.     The visualized paranasal sinuses and mastoid air cells are well-aerated.  The osseous calvarium is intact.      Impression:  No CT evidence of an acute intracranial abnormality.    Aldean Ast, MD   03/08/2021 9:54 PM           All recent brain and spine imaging (MRI, CT) personally reviewed.    Chart reviewed    Case discussed with:  Pt ED minutes; >50% time spent in counseling or coordination of care        This note was generated by the Epic EMR system/Speech recognition and may contain inherent errors or omissions not intended by the user. Grammatical errors, random word insertions, deletions and pronoun errors  are occasional consequences of this technology due to software limitations.   Not all errors are caught or corrected. If there are questions or concerns about the content of this note or information contained within the body of this dictation they should be addressed directly with the author for clarification.    Signed by: Cathe Mons, MD, MD  Spectralink: [phone]      Answering Service: [phone]

## 2021-03-09 NOTE — OT Eval Note (Signed)
[facility]  [address]  [place], [place] [address]  [phone]    Occupational Therapy Evaluation    Patient: Emily [name] MRN: [id number]   Unit: [facility] INTERMEDIATE CARE Bed: MI628/MI628-02    Time of treatment: Time Calculation  OT Received On: 03/09/21  Start Time: 0921  Stop Time: 0937  Time Calculation (min): 16 min    Consult received for [name] for OT evaluation and treatment.  Patient's medical condition is appropriate for Occupational Therapy  intervention at this time.    Interpreter utilized: no, not indicated    D/C Suggestions   Based on today's performance:  Discharge Recommendation: Home with no needs   DME Recommended for Discharge: No additional equipment/DME recommended at this time   Transport Recommendations: no limitations    Discharge recommendations are based on patient's progression/regression. Please see most recent note for updated discharge recommendations.    Assessment     Emily [name] is a 75 y.o. female admitted 03/08/2021 from home secondary to altered mental status. Pt has a PMH significant for hypothyroidism, history of breast cancer status post right mastectomy, hypertension, and hyperlipidemia. At baseline, pt reports she lives alone in an apartment on the 2nd floor; 2 FOS with HR. Bathroom set-up includes a tub/shower combination with a HHSH. At [facility] [place] [facility], pt reports being completely indep with ADLs/IADLs, mobility and was still driving. Pt supine in bed upon arrival, no signs of distress or c/o lightheadedness/dizziness. Vitals taken BP 155/62, Sp02 99%, and HR 56. Pt mod indep to transition sup to EOB. She performed LBD with supervision. Additionally, she ambulated about 10 feet no AD with supervision forward/backward along bedside as IV pole was connected to the bed. Pt returned supine and left in bed with call bell in reach and all needs met. It appears patient is at/near baseline functioning and does not warrant acute OT services. At this time, recommend  d/c home with no needs.     Brief chart review completed including review of labs review of imaging review of vitals.  Pt's ability to complete ADLs and functional transfers is at/near fxn'l baseline. Pt does not have any acute OT needs at this time. D/C OT.      Complexity Chart Review Performance Deficits Clinical Decision Making Hx/Comorbidities Assistance needed   Low  Brief  1-3 Limited options None None (or at baseline)     PMP - Progressive Mobility Protocol   PMP Activity: Step 6 - Walks in Room  Distance Walked (ft) (Step 6,7): 10 Feet     Interdisciplinary Communication: discussed with PT/RN    Plan     OT Plan  Risks/Benefits/POC Discussed with Pt/Family: With patient  Treatment Interventions: No skilled interventions needed at this time  Discharge Recommendation: Home with no needs  DME Recommended for Discharge: No additional equipment/DME recommended at this time  OT Frequency Recommended: one time visit - therapy discontinued         Medical Diagnosis: Altered mental status [R41.82]  Hypertensive emergency [I16.1]    History of Present Illness: Emily [name] is a 75 y.o. female admitted on  03/08/2021 with "past medical history of hypothyroidism, history of breast cancer status post right mastectomy, hypertension, hyperlipidemia mated on 09/27/2020 with left-sided weakness/numbness and had a negative work-up including MRI presenting with altered mental status.  Per ED, patient was not alert to place.  During my evaluation patient is alert and oriented x3.  She denies localized weakness, numbness, change in  speech or vision.  She denies abdominal pain, vomiting, or diarrhea.  She denies chest pain, cough, or shortness of breath.  She reports some urinary symptoms and stated that she has been on antibiotic.  Neurology was consulted by the ED and patient was offered tPA and refused.  She was seen in the ED on 6/02 and started on Keflex for UTI.    In the ED, BP 185/85 with heart rate of 78 and temperature  of 99.2 saturating 98% on room air.  Her lab work reveals WBC of 12.3, hemoglobin 11.5, and creatinine 0.8.  UA negative for UTI.  Urine negative.  CT head was negative for acute process.  CTA head and neck was unremarkable and CT perfusion showed no evidence of acute infarct or ischemia.  She received aspirin 325 mg in the ED.  Hospitalist service consulted for further relation and management."      Patient Active Problem List   Diagnosis   . Malignant neoplasm of overlapping sites of right female breast   . Hypertension   . Mixed hyperlipidemia   . Hypothyroid   . Right sided numbness   . Chest discomfort   . SOB (shortness of breath)   . Left sided numbness   . Left-sided weakness   . COVID-19 ruled out by laboratory testing   . Acute colitis   . Dizziness   . Right lower quadrant abdominal pain   . Abnormal CT scan, gastrointestinal tract   . History of hypothyroidism   . History of breast cancer   . Altered mental status     Past Medical History:   Diagnosis Date   . Breast cancer 2014    right breast mastectomy   . Exercise-induced angina     negative work up   . GERD (gastroesophageal reflux disease)    . Headache     resolved   . Hyperlipidemia    . Hypertension    . Hypothyroidism    . Low back pain    . Malignant neoplasm of overlapping sites of right female breast 10/19/2015   . Syncope and collapse     Chronic Vertigo   . Vertigo      Past Surgical History:   Procedure Laterality Date   . COLONOSCOPY  2021   . COLONOSCOPY, POLYPECTOMY N/A 07/21/2020    Procedure: COLONOSCOPY, POLYPECTOMY;  Surgeon: Einar Grad, MD;  Location: [facility];  Service: Gastroenterology;  Laterality: N/A;   . EGD, BIOPSY N/A 07/21/2020    Procedure: EGD, BIOPSY;  Surgeon: Einar Grad, MD;  Location: [facility];  Service: Gastroenterology;  Laterality: N/A;   . HYSTERECTOMY     . MASTECTOMY Right 2015         X-Rays/Tests/Labs:  Lab Results   Component Value Date/Time    HGB 11.5 03/09/2021 05:54 AM    HCT 36.5 03/09/2021  05:54 AM    K 4.1 03/09/2021 05:54 AM    NA 139 03/09/2021 05:54 AM    INR 1.0 03/08/2021 10:15 PM    TROPI <0.01 03/08/2021 10:15 PM    TROPI <0.01 01/03/2021 02:01 PM    TROPI <0.01 09/27/2020 01:47 PM    TROPI <0.01 08/30/2020 12:08 PM    TROPI 0.01 07/15/2009 09:40 AM     MRI Brain W WO Contrast (Order #[id number]) on 03/08/21  Pending     CT Perfusion Brain (Order: [id number]) - 03/08/2021  IMPRESSION:    No evidence of acute infarct or ischemia.  CTA Head & Neck (Order: [id number]) - 03/08/2021  IMPRESSION:    No evidence of large vessel occlusion. Unremarkable CTA  brain and neck.    CT Head WO Contrast (Order: [id number]) - 03/08/2021  IMPRESSION:   No CT evidence of an acute intracranial abnormality.    Social History: (Per family/care provider, if patient is not able to give accurate report)  Lives alone in an apartment on the 2nd floor.  Entry Steps: none   Rails: none Inside steps: 2 FOS  Rails: HR  Equipment at home:  tub/shower combination, hand held shower head and blood pressure monitor  Prior Level of Function:      Cognition: WFL    Mobility: indep   Feeding: indep   Grooming: indep   Bathing: indep   Dressing: indep   Toileting: indep     Subjective     Patient is agreeable to participation in the therapy session. Nursing clears patient for therapy.  Patient's Goal:  To go home  Pain: pt denies pain.    Objective     Precautions:   Precautions  Weight Bearing Status: no restrictions  Other Precautions: fall risk    Patient is in bed with  Telemetry, Intravenous (IV) and Intravenous Access  in place.       Observation of patient/vitals:   Vitals:    03/09/21 0147 03/09/21 0400 03/09/21 0845 03/09/21 0928   BP: 173/74 169/73 168/72 155/62   Pulse: 61 (!) 57 (!) 55 (!) 56   Resp: 17      Temp: 98.8 F (37.1 C) 98.4 F (36.9 C) 98.6 F (37 C)    TempSrc: Oral Oral Oral    SpO2: 99% 99% 100% 99%   Weight: 69.5 kg (153 lb 3.2 oz)      Height: 1.6 m (5\' 3" )          Orientation/Cognition:     Alert and  Oriented x 4  Cognition: follows all commands       Musculoskeletal Examination:     ROM Strength   RUE WFL WFL   LUE WFL WFL         Sensation: Intact to light touch, denies numbness/tingling throughout BUE   Coordination: Intact gross motor and serial opposition to B hands    Vision: WFL; wears glasses  Hearing: WFL      Functional Mobility:    Supine to sit: mod indep  Scooting: mod indep  Sit to Supine: mod indep  Sit to stand: supervision no AD  Stand to sit: supervision no AD  Transfers: on/off bed  Ambulation: 10 feet limited due to IV pole connected to bed     Balance:  Static Sit Balance: good  Dynamic Sit Balance: good  Static Stand Balance: good  Dynamic Stand Balance: good    Self Care:  LB Dressing: supervision; don/doff socks seated EOB    Endurance: good    Participation:  good    Education:  Educated the Personal assistant to role of occupational therapy, plan of care, goals  of therapy, rationale for progressing mobility and safety with mobility and ADLs and home safety.    RN notified of session outcome and that patient was left in bed with all needs met and equipment intact.   Safety measures include: handoff to nurse/clin tech/ unit secretary completed, bed alarm activated, oriented to call bell and placed within reach, personal items within reach, assistive device positioned out of reach and bed placed in lowest position.  Mobility and ADL status posted at bedside and within E.M.R.    Goals:  Goals  Goal Formulation: Patient (Goals not indicated at this time)      Therapist PPE during session procedural mask, goggles  and gloves      Signature:   Lenice Pressman, OT  03/09/2021  9:45 AM  Phone: [phone]    (For scheduling questions, please contact rehab tech [phone])    Attention MD:   Thank you for allowing [place] to participate in the care of [facility]. Regulations from the [employer] for Medicare and Medicaid Services (CMS) require your review and approval of this plan of care.     Please cosign  this note indicating you are in agreement with the Therapy Plan of Care so we may initiate the therapy treatment plan.

## 2021-03-10 DIAGNOSIS — I10 Essential (primary) hypertension: Secondary | ICD-10-CM

## 2021-03-10 LAB — HEMOLYSIS INDEX
Hemolysis Index: 28 — ABNORMAL HIGH (ref 0–24)
Hemolysis Index: 31 — ABNORMAL HIGH (ref 0–24)

## 2021-03-10 LAB — CBC AND DIFFERENTIAL
Absolute NRBC: 0 10*3/uL (ref 0.00–0.00)
Basophils Absolute Automated: 0.04 10*3/uL (ref 0.00–0.08)
Basophils Automated: 0.5 %
Eosinophils Absolute Automated: 0.04 10*3/uL (ref 0.00–0.44)
Eosinophils Automated: 0.5 %
Hematocrit: 38.8 % (ref 34.7–43.7)
Hgb: 12.3 g/dL (ref 11.4–14.8)
Immature Granulocytes Absolute: 0.02 10*3/uL (ref 0.00–0.07)
Immature Granulocytes: 0.2 %
Lymphocytes Absolute Automated: 3.15 10*3/uL (ref 0.42–3.22)
Lymphocytes Automated: 35.6 %
MCH: 27.3 pg (ref 25.1–33.5)
MCHC: 31.7 g/dL (ref 31.5–35.8)
MCV: 86 fL (ref 78.0–96.0)
MPV: 10.8 fL (ref 8.9–12.5)
Monocytes Absolute Automated: 0.67 10*3/uL (ref 0.21–0.85)
Monocytes: 7.6 %
Neutrophils Absolute: 4.93 10*3/uL (ref 1.10–6.33)
Neutrophils: 55.6 %
Nucleated RBC: 0 /100 WBC (ref 0.0–0.0)
Platelets: 317 10*3/uL (ref 142–346)
RBC: 4.51 10*6/uL (ref 3.90–5.10)
RDW: 13 % (ref 11–15)
WBC: 8.85 10*3/uL (ref 3.10–9.50)

## 2021-03-10 LAB — GLUCOSE WHOLE BLOOD - POCT: Whole Blood Glucose POCT: 106 mg/dL — ABNORMAL HIGH (ref 70–100)

## 2021-03-10 LAB — BASIC METABOLIC PANEL
Anion Gap: 7 (ref 5.0–15.0)
BUN: 9 mg/dL (ref 7–19)
CO2: 29 mEq/L (ref 22–29)
Calcium: 9.5 mg/dL (ref 7.9–10.2)
Chloride: 106 mEq/L (ref 100–111)
Creatinine: 0.8 mg/dL (ref 0.6–1.0)
Glucose: 95 mg/dL (ref 70–100)
Potassium: 3.9 mEq/L (ref 3.5–5.1)
Sodium: 142 mEq/L (ref 136–145)

## 2021-03-10 LAB — FOLATE: Folate: 16 ng/mL

## 2021-03-10 LAB — GFR: EGFR: >60

## 2021-03-10 LAB — VITAMIN D,25 OH,TOTAL: Vitamin D, 25 OH, Total: 62 ng/mL (ref 30–100)

## 2021-03-10 NOTE — Plan of Care (Signed)
The learning abilities of the patient and/or caregiver have been assessed. Today's individualized plan of care includes fall prevention, pain management and neuro check and vital signs every four hours. The patient or caregiver states the following personal goal related to the patient's deficit(s): "By discharge I want to be able to go home without weakness." The plan of care was discussed with the patient and/or caregiver, who agrees to it and demonstrates understanding of the disease process, risk factors, treatment plan, medications and consequences of noncompliance. All questions and concerns were addressed.   Problem: Safety  Goal: Patient will be free from injury during hospitalization  Outcome: Progressing  Flowsheets (Taken 03/10/2021 [phone])  Patient will be free from injury during hospitalization:   Use appropriate transfer methods   Ensure appropriate safety devices are available at the bedside   Provide and maintain safe environment   Include patient/ family/ care giver in decisions related to safety   Hourly rounding     Problem: Pain  Goal: Pain at adequate level as identified by patient  Outcome: Progressing  Flowsheets (Taken 03/10/2021 0452)  Pain at adequate level as identified by patient:   Identify patient comfort function goal   Assess for risk of opioid induced respiratory depression, including snoring/sleep apnea. Alert healthcare team of risk factors identified.   Assess pain on admission, during daily assessment and/or before any "as needed" intervention(s)   Evaluate if patient comfort function goal is met     Problem: Every Day - Stroke  Goal: Core/Quality measure requirements - Daily  Outcome: Progressing  Flowsheets (Taken 03/10/2021 0452)  Core/Quality measure requirements - Daily:   VTE Prevention: Ensure anticoagulant(s) administered and/or anti-embolism stockings/devices documented by end of day 2   Ensure antithrombotic administered or contraindication documented by LIP by end of day  2  Goal: Neurological status is stable or improving  Outcome: Progressing  Flowsheets (Taken 03/10/2021 0452)  Neurological status is stable or improving:   Monitor/assess/document neurological assessment (Stroke: every 4 hours)   Monitor/assess NIH Stroke Scale   Perform CAM Assessment   Re-assess NIH Stroke Scale for any change in status  Goal: Stable vital signs and fluid balance  Outcome: Progressing  Flowsheets (Taken 03/10/2021 0452)  Stable vital signs and fluid balance:   Position patient for maximum circulation/cardiac output   Encourage oral fluid intake  Goal: Patient will maintain adequate oxygenation  Outcome: Progressing  Flowsheets (Taken 03/10/2021 0452)  Patient will maintain adequate oxygenation: Suction secretions as needed  Goal: Patient's risk of aspiration will be minimized  Outcome: Progressing  Flowsheets (Taken 03/10/2021 0452)  Patient's risk of aspiration will be minimized:   Complete new dysphagia screen for any change in status: Keep patient NPO if patient fails   Place swallow precaution signage above bed   Order modified texture diet as recommend by Speech Pathologist   Thicken liquids as recommended by Speech Pathologist  Goal: Nutritional intake is adequate  Outcome: Progressing  Flowsheets (Taken 03/10/2021 0452)  Nutritional intake is adequate:   Assist patient with meals/food selection   Allow adequate time for meals   Encourage/perform oral hygiene as appropriate   Consult/collaborate with Clinical Nutritionist  Goal: Elimination patterns are normal or improving  Outcome: Progressing  Goal: Mobility/Activity is maintained at optimal level for patient  Outcome: Progressing  Flowsheets (Taken 03/10/2021 0452)  Mobility/activity is maintained at optimal level for patient:   Maintain proper body alignment   Increase mobility as tolerated/progressive mobility   Perform active/passive ROM  Plan activities to conserve energy, plan rest periods   Encourage independent activity per ability  Goal:  Skin integrity is maintained or improved  Outcome: Progressing  Flowsheets (Taken 03/10/2021 0452)  Skin integrity is maintained or improved:   Keep skin clean and dry   Increase activity as tolerated/progressive mobility   Assess Braden Scale every shift  Goal: Neurovascular status is stable or improving  Outcome: Progressing  Goal: Effective coping demonstrated  Outcome: Progressing  Flowsheets (Taken 03/10/2021 0452)  Effective coping demonstrated: Offer reassurance to decrease anxiety

## 2021-03-10 NOTE — Plan of Care (Signed)
Patient, Emily [name], seen by attending physician on 03/10/2021 and discharge orders noted. Current vital signs are   Vitals:    03/10/21 1656   BP: 145/73   Pulse: 68   Resp: 16   Temp: 98.2 F (36.8 C)   SpO2: 100%   Patient provided with discharge and follow up instructions. Patient verbalizes understanding of all discharge information given.   Patient escorted out of facility by staff. She was picked up by her son  Problem: Moderate/High Fall Risk Score >5  Goal: Patient will remain free of falls  Outcome: Completed     Problem: Safety  Goal: Patient will be free from injury during hospitalization  Outcome: Completed  Goal: Patient will be free from infection during hospitalization  Outcome: Completed     Problem: Pain  Goal: Pain at adequate level as identified by patient  Outcome: Completed     Problem: Side Effects from Pain Analgesia  Goal: Patient will experience minimal side effects of analgesic therapy  Outcome: Completed     Problem: Discharge Barriers  Goal: Patient will be discharged home or other facility with appropriate resources  Outcome: Completed     Problem: Psychosocial and Spiritual Needs  Goal: Demonstrates ability to cope with hospitalization/illness  Outcome: Completed     Problem: Day of Admission - Stroke  Goal: Core/Quality measure requirements - Admission  Outcome: Completed     Problem: Every Day - Stroke  Goal: Core/Quality measure requirements - Daily  Outcome: Completed  Goal: Neurological status is stable or improving  Outcome: Completed  Goal: Stable vital signs and fluid balance  Outcome: Completed  Goal: Patient will maintain adequate oxygenation  Outcome: Completed  Goal: Patient's risk of aspiration will be minimized  Outcome: Completed  Goal: Nutritional intake is adequate  Outcome: Completed  Goal: Elimination patterns are normal or improving  Outcome: Completed  Goal: Mobility/Activity is maintained at optimal level for patient  Outcome: Completed  Goal: Skin integrity is  maintained or improved  Outcome: Completed  Goal: Neurovascular status is stable or improving  Outcome: Completed  Goal: Effective coping demonstrated  Outcome: Completed  Goal: Will be able to express needs and understand communication  Outcome: Completed     Problem: Day of Discharge - Stroke  Goal: Able to express understanding of discharge instructions and stroke education  Outcome: Completed  Goal: Core/Quality measures - Discharge  Outcome: Completed

## 2021-03-10 NOTE — Discharge Summary (Signed)
MEDICINE DISCHARGE SUMMARY    Date Time: 03/10/21 4:16 PM  Patient Name: Emily [name]  Attending Physician: Karlene Einstein, MD  Primary Care Physician: Rondel Baton, MD    Date of Admission: 03/08/2021  Date of Discharge: 03/10/2021    Discharge Diagnoses:     Principal Diagnosis (Diagnosis after study, that is chiefly responsible for admission):   Active Hospital Problems    Diagnosis POA   . Principal Problem: Altered mental status Yes   . Hypertensive emergency Unknown   . History of hypothyroidism Not Applicable   . History of breast cancer Not Applicable   . Mixed hyperlipidemia Yes   . Hypertension Yes      Resolved Hospital Problems   No resolved problems to display.         Disposition:    Disposition: home with family    Pending Results, Recommendations & Instructions to providers after discharge:     1. Micro / Labs / Path pending:   Unresulted Labs     Procedure . . . Date/Time    Urine culture [[id number]] Collected: 03/09/21 1740    Specimen: Urine, Clean Catch Updated: 03/10/21 0018    Narrative:      Indications for Urine Culture:->Suprapubic Pain/Tenderness or  Dysuria            Procedures/Radiology performed:   Radiology: all results from this admission  CTA  Head & Neck    Result Date: 03/08/2021   No evidence of large vessel occlusion. Unremarkable CTA brain and neck. Aldean Ast, MD  03/08/2021 10:43 PM    CT Head WO Contrast    Result Date: 03/08/2021  No CT evidence of an acute intracranial abnormality. Aldean Ast, MD  03/08/2021 9:54 PM    CT Perfusion Brain    Result Date: 03/08/2021   No evidence of acute infarct or ischemia. Aldean Ast, MD  03/08/2021 10:29 PM    MRI Brain W WO Contrast    Result Date: 03/09/2021   Findings suggestive of mild microvascular ischemic changes. No significant change in comparison with MRI done on 09/27/2020. Merri Ray, MD  03/09/2021 1:38 PM    Dxa Bone Density Axial Skeleton    Result Date: 02/22/2021    Osteoporosis. Normal:   T-score = greater than or equal to -1.0.  Osteopenia:    T-score = -1.0 to greater than -2.5 Osteoporosis: T-score = less than or equal to -2.5 or lower Fracture risk: Doubles compared to young adult for each T-score -1.0 or greater e.g.  T-score -1.0 = 2 x fracture risk         T-score -2.0 = 4 x fracture risk If the Z-score is less than -1.00, an etiology other than or in addition to age-related bone loss must be considered. FRAX,  World Health Organization [facility]) fracture risk assessment 10 year probability of fracture risk: Major osteoporotic fracture: 5.9% Hip fracture: 1.5% FRAX Fracture probability calculated for untreated patient. Fracture probably may be lower if the patient has received treatment. Rozann Lesches, MD  02/22/2021 4:25 PM    Chest AP Portable    Result Date: 03/04/2021   No acute abnormality seen Laurena Slimmer, MD  03/04/2021 10:22 AM    Surgery: all results from this admission  * No surgery found [facility] Course:     Reason for admission/ HPI:     Per admitting MD  75 y.o. female with past medical history of hypothyroidism, history of breast cancer  status post right mastectomy, hypertension, hyperlipidemia , pw confusion. she was found to be confused disoriented by her neighbor, she was last normal at around 7 PM, 2 and half hours prior to arrival to the ED.  She was witnessed by the neighbor as being her usual self, and that at 7:30 PM she was not acting appropriately, the neighbor called the son who brought the patient to the ED tPA was offered, but declined    Pt was seen in ED 6/02 for UTI and [place] with Keflex.  Neurology was consulted by the ED and patient was offered tPA and refused.  She was seen in the ED on 6/02 and started on Keflex for UTI.    In the ED, BP 185/85 with heart rate of 78 and temperature of 99.2 saturating 98% on room air.  Her lab work reveals WBC of 12.3, hemoglobin 11.5, and creatinine 0.8.  UA negative for UTI.  Urine negative.  CT head was negative for acute process.  CTA head and neck was unremarkable and CT  perfusion showed no evidence of acute infarct or ischemia.  She received aspirin 325 mg in the ED.  Hospitalist service consulted for further relation and management.     Hospital Course:     Acute Toxic Encephalopathy- dt Transient global amnesia event or complex partial seizure  ?Abx side effect  MRI brain with contrast negative for acute pathology  CT Perfusion negative and CTA no LVO  Mild Leukocytosis POA resolved with fluids, WBC 12--?8K  UA negative for UTI  EEG was done and negative for Sz discharges  PT have completed Keflex course of recent UTI, dx on 6/02, UA was positive for esterase, UCx showed normal flora  DW DR Francesco Sor, pt clear for discharge with OP fu  PT/OT recs Home with supervision    Hypothyroidism on Synthroid    Essential HTN- Son reports elevated BP at home in 160's-190 however in the hospital BP have been in 120-130's on Lisinopril 40 mg and Norvasc 10 mg. DW PT and Son to fu with PCP and     H/o Breast CA sp mastectomy on Anastrazole        Discharge condition: stable. [place] meds and plan dw pt and Son Dorma Russell on the phone in detail, all questions answered    Discharge Day Exam:  Today:  BP 117/56   Pulse 72   Temp 98.9 F (37.2 C) (Oral)   Resp 18   Ht 1.6 m (5\' 3" )   Wt 69.5 kg (153 lb 3.2 oz)   SpO2 97%   BMI 27.14 kg/m   Ranges for the last 24 hours:  Temp:  [97.8 F (36.6 C)-99.2 F (37.3 C)] 98.9 F (37.2 C)  Heart Rate:  [57-72] 72  Resp Rate:  [17-18] 18  BP: (117-154)/(56-82) 117/56  Body mass index is 27.14 kg/m.    No intake or output data in the 24 hours ending 03/10/21 1616 General: awake, alert,in no acute distress  Cardiovascular: regular rate and rhythm, no murmurs, rubs or gallops  Lungs: clear to auscultation bilaterally, no additional sounds  Abdomen: soft, non-tender, non-distended; normoactive bowel sounds  Extremities: no edema  Neurological: Alert and oriented X 3, moves all extremities.          Wounds/decutibus ulcers/stage:    Consultations:   Treatment  Team:   Attending Provider: Karlene Einstein, MD  Consulting Physician: Alecia Lemming, MD  Recent Labs - Last 2:  Recent Labs   Lab 03/10/21  0447 03/09/21  0554 03/08/21  2215   WBC 8.85 11.19* 12.32*   Hgb 12.3 11.5 11.5   Hematocrit 38.8 36.5 37.2   Platelets 317 282 291      Recent Labs   Lab 03/08/21  2215   PT 11.9   PT INR 1.0   PTT 39      Recent Labs   Lab 03/10/21  0447 03/09/21  0554 03/08/21  2215   Sodium 142 139 136   Potassium 3.9 4.1 4.2   Chloride 106 106 102   CO2 29 24 25    BUN 9 13 15    Creatinine 0.8 0.8 0.8   EGFR >60.0 >60.0 >60.0   Glucose 95 98 107*   Calcium 9.5 9.3 9.4     Recent Labs   Lab 03/09/21  0554 03/08/21  2215   Alkaline Phosphatase 73 78   Bilirubin, Total 0.5 0.4   Bilirubin Direct  --  0.2   Protein, Total 6.1 6.5   Albumin 3.6 3.9   ALT 17 19   AST (SGOT) 22 24      Recent Labs   Lab 03/08/21  2215   TSH 0.56     Recent Labs   Lab 03/09/21  0554   Cholesterol 196   Triglycerides 50   HDL 61   LDL Calculated 125*        Microbiology Results (last 15 days)     Procedure Component Value Units Date/Time    Urine culture [[id number]] Collected: 03/09/21 1740    Order Status: Sent Specimen: Urine, Clean Catch Updated: 03/10/21 0018    Narrative:      Indications for Urine Culture:->Suprapubic Pain/Tenderness or  Dysuria    COVID-19 (SARS-COV-2) [facility] Rapid) [[id number]] Collected: 03/08/21 2215    Order Status: Completed Specimen: Nasopharyngeal Swab from Nasopharynx Updated: 03/08/21 2240     Purpose of COVID testing Screening     SARS-CoV-2 Specimen Source Nasopharyngeal     SARS CoV-2 Negative     Comment: Test performed using the Abbott ID NOW EUA assay.  Please see Fact Sheets for patients and providers located at:  http://www.rice.biz/    This test is for the qualitative detection of SARS-CoV-2  (COVID19) nucleic acid. Viral nucleic acids may persist in vivo,  independent of viability. Detection of viral nucleic acid does  not imply the presence of  infectious virus, or that virus  nucleic acid is the cause of clinical symptoms. Negative  results should be treated as presumptive and, if inconsistent  with clinical signs and symptoms or necessary for patient  management, should be tested with an alternative molecular  assay. Negative results do not preclude SARS-CoV-2 infection  and should not be used as the sole basis for patient  management decisions. Invalid results may be due to inhibiting  substances in the specimen and recollection should occur.         Narrative:      o Collect and clearly label specimen type:  o Upper respiratory specimen: One Nasopharyngeal Dry Swab NO  Transport Media.  o Hand deliver to laboratory ASAP  Indication for testing->Extended care facility admission to  semi private room  Screening    COVID-19 (SARS-CoV-2) and Influenza A/B, NAA ([facility] Rapid)- Age 38 and above [[id number]] Collected: 03/04/21 0952    Order Status: Completed Specimen: Culturette from Nasopharyngeal Updated: 03/04/21 1033     Purpose of COVID testing Diagnostic -PUI  SARS-CoV-2 Specimen Source Nasal Swab     SARS CoV 2 Overall Result Not Detected     Comment: __________________________________________________  -A result of "Detected" indicates POSITIVE for the    presence of SARS CoV-2 RNA  -A result of "Not Detected" indicates NEGATIVE for the    presence of SARS CoV-2 RNA  __________________________________________________________  Test performed using the Roche cobas Liat SARS-CoV-2 assay. This assay is  only for use under the Food and Drug Administration's Emergency Use  Authorization. This is a real-time RT-PCR assay for the qualitative  detection of SARS-CoV-2 RNA. Viral nucleic acids may persist in vivo,  independent of viability. Detection of viral nucleic acid does not imply the  presence of infectious virus, or that virus nucleic acid is the cause of  clinical symptoms. Negative results do not preclude SARS-CoV-2 infection and  should not be used as  the sole basis for diagnosis, treatment or other  patient management decisions. Negative results must be combined with  clinical observations, patient history, and/or epidemiological information.  Invalid results may be due to inhibiting substances in the specimen and  recollection should occur. Please see Fact Sheets for patients and providers  located:  WirelessDSLBlog.no          Influenza A Not Detected     Influenza B Not Detected     Comment: Test performed using the Roche cobas Liat SARS-CoV-2 & Influenza A/B assay.  This assay is only for use under the Food and Drug Administration's  Emergency Use Authorization. This is a multiplex real-time RT-PCR assay  intended for the simultaneous in vitro qualitative detection and  differentiation of SARS-CoV-2, influenza A, and influenza B virus RNA. Viral  nucleic acids may persist in vivo, independent of viability. Detection of  viral nucleic acid does not imply the presence of infectious virus, or that  virus nucleic acid is the cause of clinical symptoms. Negative results do  not preclude SARS-CoV-2, influenza A, and/or influenza B infection and  should not be used as the sole basis for diagnosis, treatment or other  patient management decisions. Negative results must be combined with  clinical observations, patient history, and/or epidemiological information.  Invalid results may be due to inhibiting substances in the specimen and  recollection should occur. Please see Fact Sheets for patients and providers  located: http://www.rice.biz/.         Narrative:      o Collect and clearly label specimen type:  o PREFERRED-Upper respiratory specimen: One Nasal Swab in  Transport Media.  o Hand deliver to laboratory ASAP  Diagnostic -PUI    Urine culture [[id number]] Collected: 03/04/21 0952    Order Status: Completed Specimen: Urine, Clean Catch Updated: 03/05/21 1528    Narrative:      Indications for Urine  Culture:->Suprapubic Pain/Tenderness or  Dysuria  ORDER#: [id number]                                    ORDERED BY: KINDER, NATHAN  SOURCE: Urine, Clean Catch                           COLLECTED:  03/04/21 09:52  ANTIBIOTICS AT COLL.:                                RECEIVED :  03/04/21  17:50  Culture Urine                              FINAL       03/05/21 15:28  03/05/21   1,000 - 9,000 CFU/ML of normal urogenital or skin microbiota, no further             work            Discharge Instructions & Follow Up Plan for Patient:   Discharge Diet: Diet heart healthy  Activity/Weight Bearing Status: as tolerated  Patient was instructed to follow up with:      Follow-up Information     Rondel Baton, MD .    Specialty: Internal Medicine  Contact information:  [address]  [place] [address]  [phone]                         Complete instructions and follow up are in the patient's After Visit Summary    Minutes spent coordinating discharge and reviewing discharge plan:>35 minutes    Discharge Medications:        Medication List      CHANGE how you take these medications    anastrozole 1 MG tablet  Commonly known as: ARIMIDEX  Take 1 tablet (1 mg total) by mouth daily  What changed: when to take this        CONTINUE taking these medications    amLODIPine 10 MG tablet  Commonly known as: NORVASC  Take 1 tablet (10 mg total) by mouth daily     aspirin 81 MG chewable tablet  Chew 1 tablet (81 mg total) by mouth daily     desonide 0.05 % ointment  Commonly known as: DESOWEN  Apply topically 2 (two) times daily     diclofenac Sodium 1 % Gel topical gel  Commonly known as: VOLTAREN  Apply 2 g topically 4 (four) times daily     ezetimibe 10 MG tablet  Commonly known as: ZETIA  Take 1 tablet (10 mg total) by mouth daily     gabapentin 100 MG capsule  Commonly known as: NEURONTIN  Take 1 capsule (100 mg total) by mouth 2 (two) times daily as needed (Neuropathy)     guaiFENesin 600 MG 12 hr tablet  Commonly known  as: MUCINEX  Take 2 tablets (1,200 mg total) by mouth 2 (two) times daily     IRON PO     lactobacillus/streptococcus Caps  Take 1 capsule by mouth daily     levothyroxine 25 MCG tablet  Commonly known as: SYNTHROID  Take 1 tablet (25 mcg total) by mouth every morning     lisinopril 40 MG tablet  Commonly known as: ZESTRIL  Take 1 tablet (40 mg total) by mouth daily     magnesium oxide 400 MG tablet  Commonly known as: MAG-OX  Take 1 tablet (400 mg total) by mouth daily     meclizine 25 MG tablet  Commonly known as: ANTIVERT  Take 1 tablet (25 mg total) by mouth 3 (three) times daily as needed for Dizziness     Metamucil 28 % packet  Generic drug: psyllium  Take 1 packet by mouth 2 (two) times daily     multivitamin with minerals tablet     pantoprazole 40 MG tablet  Commonly known as: PROTONIX  Take 1 tablet (40 mg total) by  mouth every morning before breakfast     PREVAGEN PO     rosuvastatin 40 MG tablet  Commonly known as: CRESTOR  Take 0.5 tablets (20 mg total) by mouth daily     vitamin B-1 100 MG tablet  Commonly known as: THIAMINE        STOP taking these medications    cephalexin 500 MG capsule  Commonly known as: KEFLEX     ibuprofen 400 MG tablet  Commonly known as: ADVIL            Immunizations provided:   Immunization History   Administered Date(s) Administered   . COVID-19 mRNA Vaccine Preservative Free 0.5 mL (MODERNA) 12/19/2019, 01/16/2020   . COVID-19 mRNA vaccine 12 years and above [employer]) 30 mcg/0.3 mL 08/14/2020       This note was generated by the Epic EMR system/ Dragon speech recognition and may contain inherent errors or omissions not intended by the user. Grammatical errors, random word insertions, deletions and pronoun errors  are occasional consequences of this technology due to software limitations. Not all errors are caught or corrected. If there are questions or concerns about the content of this note or information contained within the body of this dictation they should  be addressed directly with the author for clarification.    Signed by: Karlene Einstein, MD, MD  [facility]  Department of Medicine  CC: Rondel Baton, MD

## 2021-03-10 NOTE — Procedures (Signed)
EEG Report    The EEG was recorded on an 8-channel digital machine according to the International 10-20 system of electrode placement. Both referential and bipolar montages were performed.    The EEG was recorded with the patient awake, drowsy and asleep. The waking background activity in the occipital lobe consists of fairly well developed medium voltage 8-9 hz. activity that attenuates with eye opening. Some eye movement artifact and low voltage fast activity is seen frontally.     Drowsiness was achieved with generalized slowing of the background rhythm.     Intermittent photic stimulation and hyperventilation was not performed.    No interhemispheric asymmetries are noted.    INTERPRETATION: This routine EEG recorded during wakefulness and drowsiness was within normal limits for age. There is no evidence of epileptiform activity or slowing. A normal EEG, however, does not rule out epilepsy. Clinical correlation is therefore recommended.    Lynann Bologna, MD  Neurology, Neurophysiology  [facility]

## 2021-03-10 NOTE — Progress Notes (Addendum)
Date Time: 03/10/21 3:08 PM  Patient Name: Emily [name]  Attending Physician: Karlene Einstein, MD  Patient Class: Observation  Hospital Day: 0            NEUROLOGY PROGRESS NOTE             Assessment/Plan   Episode of altered mental status encephalopathy confused behavior, currently doing much better, could be due to UTI, not entirely sure, especially given negative brain MRI?  Transient global amnesia event complex partial seizure    LDL 125 hemoglobin A1c 5.5  B12 1837 folate 16.0      EEG results pending this can be followed up as outpatient  Outpatient follow-up with me in the office needs EMG nerve conduction study both hands, patient complaining of stiffness in the fingers need to rule out carpal tunnel  Okay for discharge from my standpoint           Subjective:   Patient Seen and Examined.  Feels much better back to baseline    Review of Systems:   No headache, eye, ear nose, throat problems; no coughing or wheezing or shortness of breath,   No chest pain or orthopnea, no abdominal pain, nausea or vomiting,   No pain in the body or extremities, no psychiatric, neurological, endocrine, hematological   or cardiac complaints except as noted above.         Physical Exam:   BP 117/56   Pulse 72   Temp 98.9 F (37.2 C) (Oral)   Resp 18   Ht 1.6 m (5\' 3" )   Wt 69.5 kg (153 lb 3.2 oz)   SpO2 97%   BMI 27.14 kg/m   Awake alert oriented x3 speech fluent smile symmetric moving both arms and  Reduced grip strength bilaterally    Meds:      Scheduled Meds: PRN Meds:    amLODIPine, 10 mg, Oral, Daily  anastrozole, 1 mg, Oral, QAM  ezetimibe, 10 mg, Oral, Daily  heparin (porcine), 5,000 Units, Subcutaneous, Q8H SCH  labetalol, 10 mg, Intravenous, Once  labetalol, 10 mg, Intravenous, Once  levothyroxine, 25 mcg, Oral, QAM  lisinopril, 40 mg, Oral, Daily  pantoprazole, 40 mg, Oral, QAM AC  rosuvastatin, 20 mg, Oral, Daily  thiamine, 100 mg, Oral, QAM        Continuous Infusions:   acetaminophen, 650 mg, Q6H  PRN  glucagon (rDNA), 1 mg, PRN   And  dextrose, 250 mL, PRN   And  dextrose, 25 g, PRN   And  dextrose, 25 g, PRN  melatonin, 3 mg, QHS PRN  naloxone, 0.2 mg, PRN                Labs:     Recent Labs   Lab 03/10/21  0447 03/09/21  0554 03/08/21  2215   Glucose 95 98 107*   BUN 9 13 15    Creatinine 0.8 0.8 0.8   Calcium 9.5 9.3 9.4   Sodium 142 139 136   Potassium 3.9 4.1 4.2   Chloride 106 106 102   CO2 29 24 25    Albumin  --  3.6 3.9   Magnesium  --  2.1 2.0   AST (SGOT)  --  22 24   ALT  --  17 19   Bilirubin, Total  --  0.5 0.4   Alkaline Phosphatase  --  73 78     Recent Labs   Lab 03/10/21  0447 03/09/21  0554 03/08/21  2215  WBC 8.85 11.19* 12.32*   Hgb 12.3 11.5 11.5   Hematocrit 38.8 36.5 37.2   MCV 86.0 85.7 88.2   MCH 27.3 27.0 27.3   MCHC 31.7 31.5 30.9*   Platelets 317 282 291                 Lab Results   Component Value Date    TSH 0.56 03/08/2021               .ll    Recent Labs     03/08/21  2215   PTT 39   PT 11.9   PT INR 1.0          Radiology Results (24 Hour)     ** No results found for the last 24 hours. **           All brain imaging (MRI, CT) personally reviewed.    Case discussed with: pt             I personally reviewed all of the medications.  Medication list generated using all available resources.  Elder abuse (physical)  - negative  Advanced care plan - reviewed from chart or in discussion with pt or family        This note was generated by the Epic EMR system/ Dragon speech recognition and may contain inherent errors or omissions not intended by the user. Grammatical errors, random word insertions, deletions and pronoun errors  are occasional consequences of this technology due to software limitations.   Not all errors are caught or corrected. If there are questions or concerns about the content of this note or information contained within the body of this dictation they should be addressed directly with the author for clarification.      Signed by: Cathe Mons, MD,  MD  Spectralink: [id number]      Answering Service: [phone]

## 2021-03-14 LAB — ECG 12-LEAD
Atrial Rate: 73 {beats}/min
IHS MUSE NARRATIVE AND IMPRESSION: NORMAL
P-R Interval: 200 ms
QRS Duration: 88 ms
QTC Calculation (Bezet): 418 ms
R Axis: 40 degrees
Ventricular Rate: 73 {beats}/min

## 2021-03-29 ENCOUNTER — Other Ambulatory Visit: Payer: Self-pay | Admitting: Hematology & Oncology

## 2021-03-29 DIAGNOSIS — C50919 Malignant neoplasm of unspecified site of unspecified female breast: Secondary | ICD-10-CM

## 2021-03-30 ENCOUNTER — Ambulatory Visit
Admission: RE | Admit: 2021-03-30 | Discharge: 2021-03-30 | Disposition: A | Discharge status: Home / Self Care | Payer: Medicare Other | Source: Ambulatory Visit | Attending: Hematology & Oncology | Admitting: Hematology & Oncology

## 2021-03-30 ENCOUNTER — Ambulatory Visit: Admission: RE | Admit: 2021-03-30 | Payer: Medicare Other | Source: Ambulatory Visit

## 2021-03-30 ENCOUNTER — Other Ambulatory Visit: Payer: Self-pay | Admitting: Hematology & Oncology

## 2021-03-30 DIAGNOSIS — Z853 Personal history of malignant neoplasm of breast: Secondary | ICD-10-CM | POA: Insufficient documentation

## 2021-03-30 DIAGNOSIS — C50919 Malignant neoplasm of unspecified site of unspecified female breast: Secondary | ICD-10-CM

## 2021-03-30 DIAGNOSIS — Z9011 Acquired absence of right breast and nipple: Secondary | ICD-10-CM | POA: Insufficient documentation

## 2021-03-31 ENCOUNTER — Ambulatory Visit (INDEPENDENT_AMBULATORY_CARE_PROVIDER_SITE_OTHER): Payer: Medicare Other | Admitting: Internal Medicine

## 2021-03-31 ENCOUNTER — Encounter (INDEPENDENT_AMBULATORY_CARE_PROVIDER_SITE_OTHER): Payer: Self-pay | Admitting: Internal Medicine

## 2021-03-31 VITALS — BP 114/61 | HR 61 | Temp 98.2°F | Resp 16 | Ht 63.0 in | Wt 152.8 lb

## 2021-03-31 DIAGNOSIS — K5900 Constipation, unspecified: Secondary | ICD-10-CM

## 2021-03-31 DIAGNOSIS — R140 Abdominal distension (gaseous): Secondary | ICD-10-CM

## 2021-03-31 NOTE — Progress Notes (Signed)
[address]  [place], [place] [address]  [phone]  Fax:[phone]      FOLLOW UP VISIT    Date of Visit: 03/31/2021 9:55 AM  Patient ID: Emily [name] is a 75 y.o. female.  Referring Physician: @REFPROVFL @       Reason for Consultation:   Constipation  Bloating    History:   Emily [name] is a 75 y.o. female with a history of breast cancer, status post right breast mastectomy, hypertension, hyperlipidemia, hypothyroidism, chronic vertigo, who presents for re-evaluation of constipation and bloating.    In the interim since her last GI visit, she was hospitalized in June 2022 with confusion, thought to be acute toxic encephalopathy.  Brain MRI was negative for acute pathology.  He was evaluated by neurology and advised to follow-up as outpatient to consider EMG for further evaluation of stiffness and a tingling.    Today she reported she has not initiated fiber supplements and MiraLAX for constipation.  The constipation has not changed significantly.  She endorsed bloating and gaseous fullness.    To review, she has a history of chronic constipation for many years, which worsened in the last several months, she has on average 1 BM every 1 to 3 days and a Bristol stool form scale type I stool.  She used over-the-counter stool softener and magnesium oxide.  The constipation improved.  She was previously seen in the GI office in 06/2020 for evaluation of abnormal CT finding concerning for colitis and iron deficiency anemia.  EGD showed mild antral erythema for which she received PPI therapy.  Colonoscopy was unremarkable except for a 1 mm adenoma which was resected.  Repeat CBC and iron studies showed iron deficiency anemia has resolved.      Problem List:     Patient Active Problem List   Diagnosis    Malignant neoplasm of overlapping sites of right female breast    Hypertension    Mixed hyperlipidemia    Hypothyroid    Right sided numbness    Chest discomfort    SOB (shortness of breath)     Left sided numbness    Left-sided weakness    COVID-19 ruled out by laboratory testing    Acute colitis    Dizziness    Right lower quadrant abdominal pain    Abnormal CT scan, gastrointestinal tract    History of hypothyroidism    History of breast cancer    Altered mental status    Hypertensive emergency       Past Medical History:     Past Medical History:   Diagnosis Date    Breast cancer 2014    right breast mastectomy    Exercise-induced angina     negative work up    GERD (gastroesophageal reflux disease)     Headache     resolved    Hyperlipidemia     Hypertension     Hypothyroidism     Low back pain     Malignant neoplasm of overlapping sites of right female breast 10/19/2015    Syncope and collapse     Chronic Vertigo    Vertigo        Past Surgical History:     Past Surgical History:   Procedure Laterality Date    COLONOSCOPY  2021    COLONOSCOPY, POLYPECTOMY N/A 07/21/2020    Procedure: COLONOSCOPY, POLYPECTOMY;  Surgeon: Einar Grad, MD;  Location: [facility];  Service: Gastroenterology;  Laterality: N/A;  EGD, BIOPSY N/A 07/21/2020    Procedure: EGD, BIOPSY;  Surgeon: Einar Grad, MD;  Location: [facility];  Service: Gastroenterology;  Laterality: N/A;    HYSTERECTOMY      MASTECTOMY Right 2015       Current Medications:   has a current medication list which includes the following prescription(s): amlodipine, anastrozole, apoaequorin, aspirin, desonide, ezetimibe, ferrous sulfate, gabapentin, lactobacillus/streptococcus, levothyroxine, lisinopril, magnesium oxide, meclizine, multivitamin with minerals, pantoprazole, rosuvastatin, vitamin b-1, diclofenac sodium, and guaifenesin.     Outpatient Medications Marked as Taking for the 03/31/21 encounter (Office Visit) with Einar Grad, MD   Medication Sig Dispense Refill    amLODIPine (NORVASC) 10 MG tablet Take 1 tablet (10 mg total) by mouth daily 30 tablet 0    anastrozole (ARIMIDEX) 1 MG tablet Take 1 tablet (1 mg total) by mouth daily (Patient taking  differently: Take 1 mg by mouth every morning) 90 tablet 3    Apoaequorin (PREVAGEN PO) Take by mouth daily      aspirin 81 MG chewable tablet Chew 1 tablet (81 mg total) by mouth daily 90 tablet 0    desonide (DESOWEN) 0.05 % ointment Apply topically 2 (two) times daily 15 g 0    ezetimibe (ZETIA) 10 MG tablet Take 1 tablet (10 mg total) by mouth daily 90 tablet 3    Ferrous Sulfate (IRON PO) Take by mouth      gabapentin (NEURONTIN) 100 MG capsule Take 1 capsule (100 mg total) by mouth 2 (two) times daily as needed (Neuropathy) 20 capsule 0    lactobacillus/streptococcus (RISAQUAD) Cap Take 1 capsule by mouth daily 10 capsule 0    levothyroxine (SYNTHROID) 25 MCG tablet Take 1 tablet (25 mcg total) by mouth every morning 30 tablet 0    lisinopril (ZESTRIL) 40 MG tablet Take 1 tablet (40 mg total) by mouth daily 30 tablet 0    magnesium oxide (MAG-OX) 400 MG tablet Take 1 tablet (400 mg total) by mouth daily 30 tablet 0    meclizine (ANTIVERT) 25 MG tablet Take 1 tablet (25 mg total) by mouth 3 (three) times daily as needed for Dizziness 10 tablet 0    Multiple Vitamins-Minerals (MULTIVITAMIN WITH MINERALS) tablet Take 1 tablet by mouth every morning          pantoprazole (PROTONIX) 40 MG tablet Take 1 tablet (40 mg total) by mouth every morning before breakfast 30 tablet 0    rosuvastatin (CRESTOR) 40 MG tablet Take 0.5 tablets (20 mg total) by mouth daily 90 tablet 3    vitamin B-1 (THIAMINE) 100 MG tablet Take 100 mg by mouth every morning.         Allergies:     Allergies   Allergen Reactions    Morphine Itching    Morphine And Related Itching and Nausea And Vomiting     dizzy       Family History:     Family History   Problem Relation Age of Onset    Breast cancer Neg Hx        Social History:     Social History     Tobacco Use    Smoking status: Never    Smokeless tobacco: Never   Vaping Use    Vaping Use: Never used   Substance Use Topics    Alcohol use: Not Currently    Drug use: No       Review of Systems:    Constitutional: Denies fever, chills or weight loss.  Eyes: Denies Blurred vision, eye discharge or eye pain.   ENMT: Denies changes in hearing, nasal discharge, oral lesions or sore throat.  Respiratory: Denies wheezing or cough.  Cardiovascular: Denies chest pain or palpitations.  Gastrointestinal: See HPI.  Genitourinary: Denies gross hematuria or dark urine.  Musculoskeletal: Denies back pain or joint pain.  Neurologic: Denies slurred speech, hemiplegia or focal deficit.  Psychiatric: Denies depression or anxiety.   Hematologic: Denies easy bruising.  Integumentary:  Denies skin rashes or Jaundice.    Vital Signs:   BP 114/61 (BP Site: Left arm, Patient Position: Sitting, Cuff Size: Medium)   Pulse 61   Temp 98.2 F (36.8 C) (Oral)   Resp 16   Ht 1.6 m (5\' 3" )   Wt 69.3 kg (152 lb 12.8 oz)   SpO2 97%   BMI 27.07 kg/m         Weight Monitoring 03/08/2021 03/09/2021 03/31/2021   Height 160 cm 160 cm 160 cm   Height Method Stated - -   Weight 68 kg 69.491 kg 69.31 kg   Weight Method Bed Scale Standing Scale -   BMI (calculated) 26.6 kg/m2 27.2 kg/m2 27.1 kg/m2          Physical Exam:   General appearance - Well developed and well nourished, no acute distress.  Eyes - Sclera anicteric, no ptosis.  ENMT - Mucous membranes moist, nose and ears appear normal.  Oropharynx clear.  Respiratory - Non labored respirations, no audible wheezing.  Cardiovascular - Regular rate and rhythm, no JVD, no LE edema.  Gastrointestinal - Soft, non-tender, non-distended, no masses, normal bowel sounds.   Musculoskeletal - Normal range of motion of arms and legs.  Neurologic - Alert and oriented to person, place and time.  No gross movement disorders noted.  Psychiatric: Appropriate affect.  Skin: Normal color and turgor, no rashes, no suspicious skin lesions noted.    Labs Reviewed:     Recent Labs     03/10/21  0447 03/09/21  0554 03/08/21  2215   WBC 8.85 11.19* 12.32*   Hgb 12.3 11.5 11.5   Hematocrit 38.8 36.5 37.2   Platelets  317 282 291       Recent Labs     03/08/21  2215 01/03/21  1401 09/27/20  1347   PT 11.9 11.8 11.4   PT INR 1.0 1.0 1.0       Recent Labs     03/10/21  0447 03/09/21  0554 03/08/21  2215 01/03/21  1401 11/24/20  1502 09/28/20  0530 08/30/20  1208 06/29/20  1129 04/15/19  1308 01/11/19  1710 11/13/18  1206 05/14/18  1024   Sodium 142 139 136 142   < > 141   < >  --    < > 137   < > 142   Potassium 3.9 4.1 4.2 4.3   < > 4.1   < >  --    < > 3.5   < > 3.8   Chloride 106 106 102 105   < > 104   < >  --    < > 89*   < > 104   CO2 29 24 25 28    < > 23   < >  --    < > 29   < > 32*   BUN 9 13 15 18    < > 10   < >  --    < > 13   < >  13   Creatinine 0.8 0.8 0.8 1.2*   < > 0.8   < >  --    < > 1.0   < > 0.9   Calcium 9.5 9.3 9.4 9.8   < > 9.0   < >  --    < > 10.1   < > 8.5   Albumin  --  3.6 3.9 4.4   < >  --    < >  --    < > 4.5   < > 3.2*   Protein, Total  --  6.1 6.5 7.5   < >  --    < >  --    < > 7.8   < > 5.3*   Bilirubin, Total  --  0.5 0.4 0.4   < >  --    < >  --    < > 0.3   < > 0.3   Alkaline Phosphatase  --  73 78 89   < >  --    < >  --    < > 122*   < > 83   ALT  --  17 19 16    < >  --    < >  --    < > 19   < > 15   AST (SGOT)  --  22 24 23    < >  --    < >  --    < > 35*   < > 16   Glucose 95 98 107* 105*   < > 87   < >  --    < > 106*   < > 102*   Lipase  --   --  20 28  --   --   --   --   --  40  --   --    TSH  --   --  0.56  --   --  0.75  --   --   --   --   --  0.50   Ferritin  --   --   --   --   --   --   --  24.60  --   --   --   --     < > = values in this interval not displayed.        Rads:   CTA  Head & Neck    Result Date: 03/08/2021   No evidence of large vessel occlusion. Unremarkable CTA brain and neck. Aldean Ast, MD  03/08/2021 10:43 PM    CT Head WO Contrast    Result Date: 03/08/2021  No CT evidence of an acute intracranial abnormality. Aldean Ast, MD  03/08/2021 9:54 PM    CT Perfusion Brain    Result Date: 03/08/2021   No evidence of acute infarct or ischemia. Aldean Ast, MD  03/08/2021 10:29  PM    MRI Brain W WO Contrast    Result Date: 03/09/2021   Findings suggestive of mild microvascular ischemic changes. No significant change in comparison with MRI done on 09/27/2020. Merri Ray, MD  03/09/2021 1:38 PM    Chest AP Portable    Result Date: 03/04/2021   No acute abnormality seen Laurena Slimmer, MD  03/04/2021 10:22 AM    Mammo Diagnostic w/Tomo Left    Result Date: 03/30/2021    No mammographic evidence of malignancy or significant change from the prior exam. A second reading  of this mammogram was provided with computer assisted diagnosis. No additional findings requiring imaging were noted. BI-RADS: Bi-rads category 1: Negative. RECOMMENDATION: Routine screening mammogram. The patient information was entered into a reminder system with a target date for the next mammogram.  Kinnie Feil, MD  03/30/2021 4:00 PM     GI Procedures:       Assessment and Plan:     Data Reviewed: referring physician note(s) and lab reviewed as above    1. Constipation, unspecified constipation type    2. Bloating     No orders of the defined types were placed in this encounter.     There are no discontinued medications.     Plan:  - Counseled dietary modification consisting of adequate fluid and fiber intake.  - Counseled regarding defecation habits.  - Fiber supplementation: Metamucil over-the-counter, 1 packet once to twice daily.  - Miralax 17 grams once to twice daily for constipation. The goal is to have 1 to 2 soft formed stools per today.  - Reiterated importance of adherence to medical treatment. Advised patient to stop magnesium oxide given her advanced age and risk of electrolyte disturbance.      Return if symptoms worsen or fail to improve.    It is a pleasure to participate in the care of Emily [name]. If you have any questions or concerns, please feel free to contact us at [phone].    Einar Grad, MD

## 2021-03-31 NOTE — Patient Instructions (Signed)
Patient Instructions:    - Counseled dietary modification consisting of adequate fluid and fiber intake.  - Counseled regarding defecation habits.  - Fiber supplementation: Metamucil over-the-counter, 1 packet once to twice daily.  - Miralax 17 grams once to twice daily for constipation. The goal is to have 1 to 2 soft formed stools per today.  - Reiterated importance of adherence to medical treatment. Advised patient to stop magnesium oxide given her advanced age and risk of electrolyte disturbance.    Diagnostic tests and referrals placed today:    No orders of the defined types were placed in this encounter.      Patients can now log into MyChart to review and print their Laboratory, Radiology and Referral Requisition (orders).   You will receive an email notifying you to login to your MyChart account.   1. Click Messaging   2. Click Letters   3. Select the requisition   4. Review the requisition & click print   Please note, for multiple requisitions, there will be page breaks in between each requisition (order).    Start taking the following new medications prescribed today:  No orders of the defined types were placed in this encounter.       Stop taking the following medications:  There are no discontinued medications.        ---------------

## 2021-04-02 ENCOUNTER — Encounter (INDEPENDENT_AMBULATORY_CARE_PROVIDER_SITE_OTHER): Payer: Self-pay | Admitting: Registered Nurse

## 2021-04-02 ENCOUNTER — Ambulatory Visit (INDEPENDENT_AMBULATORY_CARE_PROVIDER_SITE_OTHER): Payer: Medicare Other | Admitting: Registered Nurse

## 2021-04-02 VITALS — BP 126/64 | HR 57 | Resp 18 | Ht 63.0 in | Wt 153.8 lb

## 2021-04-02 DIAGNOSIS — R001 Bradycardia, unspecified: Secondary | ICD-10-CM

## 2021-04-02 DIAGNOSIS — R0609 Other forms of dyspnea: Secondary | ICD-10-CM

## 2021-04-02 DIAGNOSIS — R0600 Dyspnea, unspecified: Secondary | ICD-10-CM

## 2021-04-02 DIAGNOSIS — R079 Chest pain, unspecified: Secondary | ICD-10-CM

## 2021-04-02 DIAGNOSIS — I10 Essential (primary) hypertension: Secondary | ICD-10-CM

## 2021-04-02 MED ORDER — AMLODIPINE BESYLATE 10 MG PO TABS
10.0000 mg | ORAL_TABLET | Freq: Every day | ORAL | 1 refills | Status: DC
Start: 2021-04-02 — End: 2024-02-08

## 2021-04-02 MED ORDER — LISINOPRIL 40 MG PO TABS
40.0000 mg | ORAL_TABLET | Freq: Every day | ORAL | 1 refills | Status: DC
Start: 2021-04-02 — End: 2024-02-08

## 2021-04-02 NOTE — Patient Instructions (Signed)
Wear at 14-day monitor (MCOT).     Schedule a nuclear stress test

## 2021-04-02 NOTE — Progress Notes (Signed)
[facility] OFFICE VISIT    I had the pleasure of seeing Emily [name] today in a cardiac follow up office visit for shortness of breath that has worsened over the last month and low heart rate. She was in the hospital 03/08/21-03/10/21 for altered mental status and hypertensive emergency.     [name] is a 75 y.o. female with a past medical history significant for HTN, hypercholesterolemia, hypothyroidism, breast CA with mastectomy. Hospital Dec 2021 with arm numbness but no acute event by MRI or clinical evaluation, Cervical and thoracic spondylosis.     At today's visit [name] is doing well.  She denies edema, presyncope, or syncope.   She continues to report left-sided chest pain that is palpable and improves with movement this is unchanged from previous visits.  She reports shortness of breath that is worsened over the last month especially noticeable when walking upstairs to get to her home she now can only walk up 6 stairs before she needs to rest and she was previously able to walk up more than 12.  She also reports a low heart rate and has sinus bradycardia at 50 bpm today.  Her blood pressure at home runs in the 110s over 60s and her heart rate is in the 50s.  She has some dizziness when she moves from lying down to getting up but it is manageable. She has 1-2 palpitations per week.    Cardiographics:  TTE(Apr 2021):  LVEF 60-65%, LV Grade I DD      Nuclear stress Jul 2020: Normal without evidence of inducible ischemia or scar.     ZOX:WRUEA bradycardia at 50 bpm with nonspecific T wave abnormality, no significant change    PMH:   Patient Active Problem List    Diagnosis Date Noted    Hypertensive emergency     Altered mental status 03/08/2021    History of hypothyroidism 09/27/2020    History of breast cancer 09/27/2020    Right lower quadrant abdominal pain 07/10/2020    Abnormal CT scan, gastrointestinal tract 07/10/2020    Dizziness 05/28/2020    Acute colitis 03/25/2020    COVID-19  ruled out by laboratory testing 03/10/2020    Left-sided weakness 01/27/2020    Left sided numbness 04/15/2019    Chest discomfort 03/15/2019    SOB (shortness of breath) 03/15/2019    Right sided numbness 11/13/2018    Hypothyroid 04/03/2018    Mixed hyperlipidemia 03/15/2018    Hypertension 01/19/2018    Malignant neoplasm of overlapping sites of right female breast 10/19/2015        MEDICATIONS:     Current Outpatient Medications   Medication Sig Dispense Refill    anastrozole (ARIMIDEX) 1 MG tablet Take 1 tablet (1 mg total) by mouth daily (Patient taking differently: Take 1 mg by mouth every morning) 90 tablet 3    Apoaequorin (PREVAGEN PO) Take by mouth daily      aspirin 81 MG chewable tablet Chew 1 tablet (81 mg total) by mouth daily 90 tablet 0    desonide (DESOWEN) 0.05 % ointment Apply topically 2 (two) times daily 15 g 0    diclofenac Sodium (VOLTAREN) 1 % Gel topical gel Apply 2 g topically 4 (four) times daily 100 g 0    ezetimibe (ZETIA) 10 MG tablet Take 1 tablet (10 mg total) by mouth daily 90 tablet 3    Ferrous Sulfate (IRON PO) Take by mouth      gabapentin (  NEURONTIN) 100 MG capsule Take 1 capsule (100 mg total) by mouth 2 (two) times daily as needed (Neuropathy) 20 capsule 0    guaiFENesin (MUCINEX) 600 MG 12 hr tablet Take 2 tablets (1,200 mg total) by mouth 2 (two) times daily 14 tablet 0    lactobacillus/streptococcus (RISAQUAD) Cap Take 1 capsule by mouth daily 10 capsule 0    levothyroxine (SYNTHROID) 25 MCG tablet Take 1 tablet (25 mcg total) by mouth every morning 30 tablet 0    magnesium oxide (MAG-OX) 400 MG tablet Take 1 tablet (400 mg total) by mouth daily 30 tablet 0    meclizine (ANTIVERT) 25 MG tablet Take 1 tablet (25 mg total) by mouth 3 (three) times daily as needed for Dizziness 10 tablet 0    Multiple Vitamins-Minerals (MULTIVITAMIN WITH MINERALS) tablet Take 1 tablet by mouth every morning          pantoprazole (PROTONIX) 40 MG tablet Take 1 tablet (40 mg total) by mouth  every morning before breakfast 30 tablet 0    rosuvastatin (CRESTOR) 40 MG tablet Take 0.5 tablets (20 mg total) by mouth daily 90 tablet 3    vitamin B-1 (THIAMINE) 100 MG tablet Take 100 mg by mouth every morning.      amLODIPine (NORVASC) 10 MG tablet Take 1 tablet (10 mg total) by mouth daily 90 tablet 1    lisinopril (ZESTRIL) 40 MG tablet Take 1 tablet (40 mg total) by mouth daily 90 tablet 1     No current facility-administered medications for this visit.        SH:   Social History     Tobacco Use    Smoking status: Never    Smokeless tobacco: Never   Vaping Use    Vaping Use: Never used   Substance Use Topics    Alcohol use: Not Currently    Drug use: No       REVIEW OF SYSTEMS: All other systems reviewed and negative except as above.    PHYSICAL EXAMINATION   General Appearance: A well-appearing female in no acute distress.   Vital Signs: BP 126/64 (BP Site: Left arm, Patient Position: Sitting, Cuff Size: Medium)   Pulse (!) 57   Resp 18   Ht 1.6 m (5\' 3" )   Wt 69.8 kg (153 lb 12.8 oz)   SpO2 97%   BMI 27.24 kg/m    HEENT: Sclera anicteric, conjunctiva without pallor, moist mucous membranes, normal dentition.   Neck: Supple without jugular venous distention.Normal carotid upstrokes without bruits.  Chest: Clear to auscultation bilaterally with good air movement and respiratory effort and no wheezes, rales, or rhonchi  Cardiovascular: Normal S1 and physiologically split S2 without murmurs, gallops or rub. PMI of normal size and nondisplaced.   Abdomen: Soft, nontender. No organomegaly.  No pulsatile masses or bruits.    Extremities: Warm without edema. All peripheral pulses are full and equal.  Skin: No rash, xanthoma or xanthelasma.   Neuro: Alert and oriented x3. Grossly intact. Strength is symmetrical. Normal mood and affect.       Basic Metabolic Profile   Lab Results   Component Value Date    NA 142 03/10/2021    K 3.9 03/10/2021    BUN 9 03/10/2021    CREAT 0.8 03/10/2021    MG 2.1 03/09/2021     CA 9.5 03/10/2021    GLU 95 03/10/2021         Cardiac Biomarkers   Lab Results  Component Value Date    BNP 35 01/03/2021        CBC with Diff   Lab Results   Component Value Date    WBC 8.85 03/10/2021    HGB 12.3 03/10/2021    HCT 38.8 03/10/2021    PLT 317 03/10/2021         Cholesterol Panel   Lab Results   Component Value Date    CHOL 196 03/09/2021    HDL 61 03/09/2021    LDL 125 (H) 03/09/2021    TRIG 50 03/09/2021         Endocrine   Lab Results   Component Value Date    HGBA1C 5.5 03/09/2021    HGBA1C 5.4 09/28/2020    HGBA1C 5.2 05/29/2020    TSH 0.56 03/08/2021         Coagulation Studies   Lab Results   Component Value Date    INR 1.0 03/08/2021    DDIMER 0.93 (H) 05/10/2020           ASSESSMENT & PLAN:   1. Chest pain, unspecified type  ECG 12 lead      2. DOE (dyspnea on exertion)  Exercise Nuclear Stress Test    CANCELED: NM Exercise Stress Test      3. Bradycardia  (MCOT) Mobile Cardiac Telemetry    Exercise Nuclear Stress Test      4. Essential hypertension  amLODIPine (NORVASC) 10 MG tablet    lisinopril (ZESTRIL) 40 MG tablet         *SOB  -worsening over last month, with exertion  -normal nuclear stress in July 2020, if negative, consider seasonal allergies/lungs    *bradycardia  -14-day MCOT monitor to rule out pauses  -possibly contributing to SOB    *Chest pain  -unchanged from previous visits, atypical, improves with movement, palpable    *HTN  -controlled   -continue CCB, ACEi    Return for Follow up after testing, with Dr. Franchot Erichsen, or APP.  ----------------------------  Phillis Haggis, [facility]  [facility]   Tel.: [phone], Fax: [phone]  [address], [place], [place] [address]  [address], [place], [place] [address]  _____________________________      Incident to service performed with physician present in the office. The physician's plan of care was implemented.

## 2021-04-21 ENCOUNTER — Emergency Department: Payer: Medicare Other

## 2021-04-21 ENCOUNTER — Emergency Department
Admission: EM | Admit: 2021-04-21 | Discharge: 2021-04-21 | Disposition: A | Discharge status: Home / Self Care | Payer: Medicare Other | Attending: Emergency Medicine | Admitting: Emergency Medicine

## 2021-04-21 DIAGNOSIS — M940 Chondrocostal junction syndrome [Tietze]: Secondary | ICD-10-CM | POA: Insufficient documentation

## 2021-04-21 DIAGNOSIS — R079 Chest pain, unspecified: Secondary | ICD-10-CM

## 2021-04-21 LAB — COMPREHENSIVE METABOLIC PANEL
ALT: 12 U/L (ref 0–55)
AST (SGOT): 21 U/L (ref 5–34)
Albumin/Globulin Ratio: 1.6 (ref 0.9–2.2)
Albumin: 3.9 g/dL (ref 3.5–5.0)
Alkaline Phosphatase: 76 U/L (ref 37–117)
Anion Gap: 9 (ref 5.0–15.0)
BUN: 11 mg/dL (ref 7.0–19.0)
Bilirubin, Total: 0.3 mg/dL (ref 0.2–1.2)
CO2: 28 mEq/L (ref 22–29)
Calcium: 9.6 mg/dL (ref 7.9–10.2)
Chloride: 107 mEq/L (ref 100–111)
Creatinine: 0.9 mg/dL (ref 0.6–1.0)
Globulin: 2.5 g/dL (ref 2.0–3.6)
Glucose: 106 mg/dL — ABNORMAL HIGH (ref 70–100)
Potassium: 4.2 mEq/L (ref 3.5–5.1)
Protein, Total: 6.4 g/dL (ref 6.0–8.3)
Sodium: 144 mEq/L (ref 136–145)

## 2021-04-21 LAB — CBC AND DIFFERENTIAL
Absolute NRBC: 0 10*3/uL (ref 0.00–0.00)
Basophils Absolute Automated: 0.02 10*3/uL (ref 0.00–0.08)
Basophils Automated: 0.4 %
Eosinophils Absolute Automated: 0.07 10*3/uL (ref 0.00–0.44)
Eosinophils Automated: 1.3 %
Hematocrit: 34.4 % — ABNORMAL LOW (ref 34.7–43.7)
Hgb: 10.5 g/dL — ABNORMAL LOW (ref 11.4–14.8)
Immature Granulocytes Absolute: 0.01 10*3/uL (ref 0.00–0.07)
Immature Granulocytes: 0.2 %
Lymphocytes Absolute Automated: 1.69 10*3/uL (ref 0.42–3.22)
Lymphocytes Automated: 30.7 %
MCH: 27.1 pg (ref 25.1–33.5)
MCHC: 30.5 g/dL — ABNORMAL LOW (ref 31.5–35.8)
MCV: 88.9 fL (ref 78.0–96.0)
MPV: 11.1 fL (ref 8.9–12.5)
Monocytes Absolute Automated: 0.46 10*3/uL (ref 0.21–0.85)
Monocytes: 8.3 %
Neutrophils Absolute: 3.26 10*3/uL (ref 1.10–6.33)
Neutrophils: 59.1 %
Nucleated RBC: 0 /100 WBC (ref 0.0–0.0)
Platelets: 317 10*3/uL (ref 142–346)
RBC: 3.87 10*6/uL — ABNORMAL LOW (ref 3.90–5.10)
RDW: 13 % (ref 11–15)
WBC: 5.51 10*3/uL (ref 3.10–9.50)

## 2021-04-21 LAB — GFR: EGFR: >60

## 2021-04-21 LAB — TROPONIN I: Troponin I: 0.02 ng/mL (ref 0.00–0.05)

## 2021-04-21 MED ORDER — LIDOCAINE 5 % EX PTCH
1.0000 | MEDICATED_PATCH | Freq: Once | CUTANEOUS | Status: DC
Start: 2021-04-21 — End: 2021-04-21
  Administered 2021-04-21: 1 via TRANSDERMAL
  Filled 2021-04-21: qty 1

## 2021-04-21 MED ORDER — KETOROLAC TROMETHAMINE 30 MG/ML IJ SOLN
30.0000 mg | Freq: Once | INTRAMUSCULAR | Status: AC
Start: 2021-04-21 — End: 2021-04-21
  Administered 2021-04-21: 30 mg via INTRAMUSCULAR
  Filled 2021-04-21: qty 1

## 2021-04-21 MED ORDER — LIDOCAINE 5 % EX PTCH
1.0000 | MEDICATED_PATCH | CUTANEOUS | 0 refills | Status: DC
Start: 2021-04-21 — End: 2021-11-11

## 2021-04-21 NOTE — EDIE (Signed)
COLLECTIVE?NOTIFICATION?04/21/2021 10:51?Emmaleigh, Longo B?MRN: [id number]    [place] [facility]'s patient encounter information:   FAO:?[id number]  Account [id number]  Billing Account [id number]      Criteria Met      5 ED Visits in 12 Months    Security and Safety  No Security Events were found.  ED Care Guidelines  There are currently no ED Care Guidelines for this patient. Please check your facility's medical records system.        Prescription Monitoring Program  000??- Narcotic Use Score  000??- Sedative Use Score  000??- Stimulant Use Score  000??- Overdose Risk Score  - All Scores range from 000-999 with 75% of the population scoring < 200 and on 1% scoring above 650  - The last digit of the narcotic, sedative, and stimulant score indicates the number of active prescriptions of that type  - Higher Use scores correlate with increased prescribers, pharmacies, mg equiv, and overlapping prescriptions  - Higher Overdose Risk Scores correlate with increased risk of unintentional overdose death   Concerning or unexpectedly high scores should prompt a review of the PMP record; this does not constitute checking PMP for prescribing purposes.    E.D. Visit Count (12 mo.)  Facility Visits   [place] [facility] 10   Total 10   Note: Visits indicate total known visits.     Recent Emergency Department Visit Summary  Date Facility [facility] Type Diagnoses or Chief Complaint    Apr 21, 2021  [place] [facility]  [place]  [place]  Emergency      headache, body aches, leg tingling      Mar 08, 2021  [place] [facility]  [place]  [place]  Emergency      Medic      Altered Mental Status      Altered mental status, unspecified      Mar 04, 2021  [place] [facility]  [place]  [place]  Emergency      cough/body aches      Generalized Body Aches      Myalgia, unspecified site      Sciatica, left side      Contact with and (suspected) exposure to COVID-19      Urinary tract infection, site not specified       Jan 03, 2021  [place] [facility]  [place]  [place]  Emergency      pain in both legs/cough      Generalized weakness      Dizziness      Chest Pain      Weakness      Paresthesia of skin      Dizziness and giddiness      Chest pain, unspecified      Nov 24, 2020  [place] [facility]  [place]  [place]  Emergency      dizziness, tingling      dizziness, tingling on left side      dizziness, tingling on left side, bodyaches      Dizziness      Polyneuropathy, unspecified      Malignant neoplasm of unspecified site of unspecified female breast      Sep 27, 2020   [facility]  [place]  [place]  Emergency      Body Pain      Extremity Weakness      Weakness      Contact with  and (suspected) exposure to covid-19      Aug 30, 2020  [place] [facility]  [place]  [place]  Emergency      headache,vomiting,back pain      Dizziness      Cough      Solitary pulmonary nodule      Pneumonia, unspecified organism      May 28, 2020  [place] [facility]  [place]  [place]  Emergency      vaginal/rectal bleeding/dizziness      Headache      Tingling      Dizziness and giddiness      May 12, 2020  [place] [facility]  [place]  [place]  Emergency      Chest Pain,Tingling,Weak      Tingling      Headache      Chest Pain      Back Pain      Chest pain, unspecified      Mastodynia      Headache, unspecified      Weakness      Paresthesia of skin      May 10, 2020  [place] [facility]  [place]  [place]  Emergency      weakness. lump by lt ear      Fatigue      Chest Pain      Generalized weakness      Pruritus, unspecified      Other chest pain        Recent Inpatient Visit Summary  No Recent Inpatient Visits were found.  Care Team  Provider Specialty Phone Fax Service Dates   Rondel Baton, M.D. Internal Medicine   Current    DAVIDSON, Janet Berlin MD M, MD Family Medicine: Adult Medicine [phone] Current      Collective Portal  This patient has registered at the [facility] Emergency Department   For more  information visit: [email or web address]     PLEASE NOTE:     1.   Any care recommendations and other clinical information are provided as guidelines or for historical purposes only, and providers should exercise their own clinical judgment when providing care.    2.   You may only use this information for purposes of treatment, payment or health care operations activities, and subject to the limitations of applicable Collective Policies.    3.   You should consult directly with the organization that provided a care guideline or other clinical history with any questions about additional information or accuracy or completeness of information provided.    ? 2022 [place] - [email or web address]

## 2021-04-22 LAB — ECG 12-LEAD
Atrial Rate: 63 {beats}/min
IHS MUSE NARRATIVE AND IMPRESSION: NORMAL
P Axis: 53 degrees
P-R Interval: 164 ms
Q-T Interval: 392 ms
QRS Duration: 84 ms
QTC Calculation (Bezet): 401 ms
R Axis: 50 degrees
T Axis: 43 degrees
Ventricular Rate: 63 {beats}/min

## 2021-04-22 NOTE — ED Provider Notes (Signed)
EMERGENCY DEPARTMENT NOTE     Patient initially seen and examined at   ED PHYSICIAN ASSIGNED       Date/Time Event User Comments    04/21/21 1053 Physician Assigned [name], DO assigned as Attending           ED MIDLEVEL (APP) ASSIGNED       None            HISTORY OF PRESENT ILLNESS   Historian:Patient  Translator Used: No    Chief Complaint: Chest wall pain and Dizziness     Mechanism of Injury: Denies trauma, fall, injury      75 y.o. female with remote history of breast cancer and bilateral mastectomy, hypertension, hyperlipidemia, GERD presents to the ED complaining of right-sided chest pain that radiates to her back.  Complains of upper back pain.  Complains of pain all over her body.  Denies shortness of breath.  Denies nausea vomiting diarrhea.  Denies fever or chills.    Location of symptoms: Right-sided chest pain  Onset of symptoms: Acute on chronic  What was patient doing when symptoms started (Context): see above  Severity: moderate  Timing: Constant  Activities that worsen symptoms: Movement  Activities that improve symptoms: Nothing  Quality: Aching and sore  Radiation of symptoms: no-to right back  Associated signs and Symptoms: see above  Are symptoms worsening? yes   MEDICAL HISTORY     Past Medical History:  Past Medical History:   Diagnosis Date    Breast cancer 2014    right breast mastectomy    Exercise-induced angina     negative work up    GERD (gastroesophageal reflux disease)     Headache     resolved    Hyperlipidemia     Hypertension     Hypothyroidism     Low back pain     Malignant neoplasm of overlapping sites of right female breast 10/19/2015    Syncope and collapse     Chronic Vertigo    Vertigo        Past Surgical History:  Past Surgical History:   Procedure Laterality Date    COLONOSCOPY  2021    COLONOSCOPY, POLYPECTOMY N/A 07/21/2020    Procedure: COLONOSCOPY, POLYPECTOMY;  Surgeon: Einar Grad, MD;  Location: [facility];  Service: Gastroenterology;   Laterality: N/A;    EGD, BIOPSY N/A 07/21/2020    Procedure: EGD, BIOPSY;  Surgeon: Einar Grad, MD;  Location: [facility];  Service: Gastroenterology;  Laterality: N/A;    HYSTERECTOMY      MASTECTOMY Right 2015       Social History:  Social History     Socioeconomic History    Marital status: Widowed   Tobacco Use    Smoking status: Never    Smokeless tobacco: Never   Vaping Use    Vaping Use: Never used   Substance and Sexual Activity    Alcohol use: Yes     Comment: occ    Drug use: No    Sexual activity: Not Currently       Family History:  Family History   Problem Relation Age of Onset    Breast cancer Neg Hx        Outpatient Medication:  Discharge Medication List as of 04/21/2021  1:48 PM        CONTINUE these medications which have NOT CHANGED    Details   amLODIPine (NORVASC) 10 MG tablet Take 1  tablet (10 mg total) by mouth daily, Starting Fri 04/02/2021, E-Rx      anastrozole (ARIMIDEX) 1 MG tablet Take 1 tablet (1 mg total) by mouth daily, Starting Fri 01/19/2018, Normal      Apoaequorin (PREVAGEN PO) Take by mouth daily, Historical Med      aspirin 81 MG chewable tablet Chew 1 tablet (81 mg total) by mouth daily, Starting Wed 01/29/2020, E-Rx      desonide (DESOWEN) 0.05 % ointment Apply topically 2 (two) times daily, Starting Sun 05/10/2020, E-Rx      diclofenac Sodium (VOLTAREN) 1 % Gel topical gel Apply 2 g topically 4 (four) times daily, Starting Thu 03/04/2021, Normal      ezetimibe (ZETIA) 10 MG tablet Take 1 tablet (10 mg total) by mouth daily, Starting Tue 10/20/2020, E-Rx      Ferrous Sulfate (IRON PO) Take by mouth, Historical Med      gabapentin (NEURONTIN) 100 MG capsule Take 1 capsule (100 mg total) by mouth 2 (two) times daily as needed (Neuropathy), Starting Tue 11/24/2020, Print      guaiFENesin (MUCINEX) 600 MG 12 hr tablet Take 2 tablets (1,200 mg total) by mouth 2 (two) times daily, Starting Sun 08/30/2020, E-Rx      lactobacillus/streptococcus (RISAQUAD) Cap Take 1 capsule by mouth daily,  Starting Sat 03/21/2020, E-Rx      levothyroxine (SYNTHROID) 25 MCG tablet Take 1 tablet (25 mcg total) by mouth every morning, Starting Wed 01/29/2020, E-Rx      lisinopril (ZESTRIL) 40 MG tablet Take 1 tablet (40 mg total) by mouth daily, Starting Fri 04/02/2021, E-Rx      magnesium oxide (MAG-OX) 400 MG tablet Take 1 tablet (400 mg total) by mouth daily, Starting Fri 05/29/2020, E-Rx      meclizine (ANTIVERT) 25 MG tablet Take 1 tablet (25 mg total) by mouth 3 (three) times daily as needed for Dizziness, Starting Thu 08/08/2019, E-Rx      Multiple Vitamins-Minerals (MULTIVITAMIN WITH MINERALS) tablet Take 1 tablet by mouth every morning    , Historical Med      pantoprazole (PROTONIX) 40 MG tablet Take 1 tablet (40 mg total) by mouth every morning before breakfast, Starting Wed 01/29/2020, E-Rx      rosuvastatin (CRESTOR) 40 MG tablet Take 0.5 tablets (20 mg total) by mouth daily, Starting Mon 09/28/2020, Print      vitamin B-1 (THIAMINE) 100 MG tablet Take 100 mg by mouth every morning., Historical Med               REVIEW OF SYSTEMS   Review of Systems   Constitutional:  Negative for fever.   Respiratory:  Positive for chest tightness. Negative for cough and shortness of breath.    Cardiovascular:  Positive for chest pain. Negative for leg swelling.   Gastrointestinal:  Negative for abdominal pain and vomiting.   Skin:  Negative for rash.   All other systems reviewed and are negative.       PHYSICAL EXAM     ED Triage Vitals   Enc Vitals Group      BP 04/21/21 1059 138/62      Heart Rate 04/21/21 1059 71      Resp Rate 04/21/21 1059 19      Temp 04/21/21 1059 98.7 F (37.1 C)      Temp Source 04/21/21 1059 Oral      SpO2 04/21/21 1059 97 %      Weight 04/21/21 1059 70 kg  Height --       Head Circumference --       Peak Flow --       Pain Score 04/21/21 1131 9      Pain Loc --       Pain Edu? --       Excl. in GC? --      Nursing note and vitals reviewed.  Constitutional:  Well developed, well nourished. Awake &  Oriented x3.  Head:  Atraumatic. Normocephalic.    Eyes:  PERRL. EOMI. Conjunctivae are not pale.  ENT:  Mucous membranes are moist and intact. Oropharynx is clear and symmetric.  Patent airway.  Neck:  Supple. Full ROM.    Cardiovascular:  Regular rate. Regular rhythm. No murmurs, rubs, or gallops.  Pulmonary/Chest:  No evidence of respiratory distress. Clear to auscultation bilaterally.  No wheezing, rales or rhonchi.  Positive tenderness to palpation right anterior chest wall.  Abdominal:  Soft and non-distended. There is no tenderness. No rebound, guarding, or rigidity.  Back:  Full ROM.  Positive tenderness to palpation right upper back.  Extremities:  No edema. No cyanosis. No clubbing. Full range of motion in all extremities.  Skin:  Skin is warm and dry.  No diaphoresis. No rash.  No vesicles of right chest wall or right upper back  Neurological:  Alert, awake, and appropriate. Normal speech. Motor normal.  Psychiatric:  Good eye contact. Normal interaction, anxious affect, and behavior.        MEDICAL DECISION MAKING   Pt given IV fluids for hydration  CXR to r/o pneumonia  Troponin to r/o ischemia    Chest pain is reproducible.  Back pain is reproducible.  Patient likely costochondritis.  Patient has been seen multiple times for the same and has been evaluated by cardiology on 04/02/2021.     Pt has EF 65% in April 2021    Patient had nuclear stress test July 2020 which was negative for ischemia    Pt feels better  Vss. Results discussed with pt.  Instructed to take medications as prescribed.    Will d/c. Pt instructed to follow up with pcp.  Instructed to return for worsening symptoms. Pt agrees.    Differential diagnosis includes but is not limited to costochondritis, cardiac ischemia, pe, acs, musculoskeletal pain, anxiety     DISCUSSION           I do not suspect severe sepsis or septic shock    Vital Signs: Reviewed the patient's vital signs.   Nursing Notes: Reviewed and utilized available nursing  notes.  Medical Records Reviewed: Reviewed available past medical records.  Counseling: The emergency provider has spoken with the patient and discussed today's findings, in addition to providing specific details for the plan of care.  Questions are answered and there is agreement with the plan.      MIPS DOCUMENTATION      CARDIAC STUDIES    The following cardiac studies were independently interpreted by the Emergency Medicine Physician.  For full cardiac study results please see chart.    Monitor Strip  Interpreted by ED Physician  Rate: 71  Rhythm: NSR   ST Changes: none    EKG Interpretation:  Signed and interpreted byED Physician   Time Interpreted: 1102  Comparison: 04/02/21  Rate: 63  Rhythm: NSR  Axis: normal  Intervals: normal  Blocks: none  ST segments: nml  Interpretation: Nonspecific  EKG    EMERGENCY IMAGING STUDIES    The  following imagine studies were independently interpreted by me (emergency physician):    Radiology:  Interpreted by me (ED Physician)  Study: Chest Xray   Results: No infiltrate. No pneumothorax. No hemothorax. No cardiomegaly. No CHF.  Impression: No acute intrathoracic abnormality.      RADIOLOGY IMAGING STUDIES      CT Chest without Contrast   Final Result         1.  No focal consolidation, pleural effusion, or pneumothorax.      2.  Cardiomegaly.      3.  Bilateral renal cysts.      Raynelle Dick, MD    04/21/2021 1:20 PM      Chest AP Portable   Final Result      No acute cardiopulmonary disease.      Fonnie Mu, DO    04/21/2021 12:01 PM              PULSE OXIMETRY    Oxygen Saturation by Pulse Oximetry: 100%  Interventions: none  Interpretation:  nml    EMERGENCY DEPT. MEDICATIONS      ED Medication Orders (From admission, onward)      Start Ordered     Status Ordering Provider    04/21/21 1203 04/21/21 1202  ketorolac (TORADOL) injection 30 mg  Once        Route: Intramuscular  Ordered Dose: 30 mg     Last MAR action: Given Loetta Rough    04/21/21 1109 04/21/21 1108     Once in ED        Route: Transdermal  Ordered Dose: 1 patch     Discontinued Rajohn Henery G            LABORATORY RESULTS    Ordered and independently interpreted AVAILABLE laboratory tests. Please see results section in chart for full details.  Results for orders placed or performed during the hospital encounter of 04/21/21   CBC and differential   Result Value Ref Range    WBC 5.51 3.10 - 9.50 x10 3/uL    Hgb 10.5 (L) 11.4 - 14.8 g/dL    Hematocrit 96.0 (L) 34.7 - 43.7 %    Platelets 317 142 - 346 x10 3/uL    RBC 3.87 (L) 3.90 - 5.10 x10 6/uL    MCV 88.9 78.0 - 96.0 fL    MCH 27.1 25.1 - 33.5 pg    MCHC 30.5 (L) 31.5 - 35.8 g/dL    RDW 13 11 - 15 %    MPV 11.1 8.9 - 12.5 fL    Neutrophils 59.1 None %    Lymphocytes Automated 30.7 None %    Monocytes 8.3 None %    Eosinophils Automated 1.3 None %    Basophils Automated 0.4 None %    Immature Granulocytes 0.2 None %    Nucleated RBC 0.0 0.0 - 0.0 /100 WBC    Neutrophils Absolute 3.26 1.10 - 6.33 x10 3/uL    Lymphocytes Absolute Automated 1.69 0.42 - 3.22 x10 3/uL    Monocytes Absolute Automated 0.46 0.21 - 0.85 x10 3/uL    Eosinophils Absolute Automated 0.07 0.00 - 0.44 x10 3/uL    Basophils Absolute Automated 0.02 0.00 - 0.08 x10 3/uL    Immature Granulocytes Absolute 0.01 0.00 - 0.07 x10 3/uL    Absolute NRBC 0.00 0.00 - 0.00 x10 3/uL   Comprehensive metabolic panel   Result Value Ref Range    Glucose 106 (H) 70 - 100 mg/dL  BUN 11.0 7.0 - 19.0 mg/dL    Creatinine 0.9 0.6 - 1.0 mg/dL    Sodium 161 096 - 045 mEq/L    Potassium 4.2 3.5 - 5.1 mEq/L    Chloride 107 100 - 111 mEq/L    CO2 28 22 - 29 mEq/L    Calcium 9.6 7.9 - 10.2 mg/dL    Protein, Total 6.4 6.0 - 8.3 g/dL    Albumin 3.9 3.5 - 5.0 g/dL    AST (SGOT) 21 5 - 34 U/L    ALT 12 0 - 55 U/L    Alkaline Phosphatase 76 37 - 117 U/L    Bilirubin, Total 0.3 0.2 - 1.2 mg/dL    Globulin 2.5 2.0 - 3.6 g/dL    Albumin/Globulin Ratio 1.6 0.9 - 2.2    Anion Gap 9.0 5.0 - 15.0   Troponin I   Result Value Ref Range     Troponin I 0.02 0.00 - 0.05 ng/mL   GFR   Result Value Ref Range    EGFR >60.0    ECG 12 lead   Result Value Ref Range    Ventricular Rate 63 BPM    Atrial Rate 63 BPM    P-R Interval 164 ms    QRS Duration 84 ms    Q-T Interval 392 ms    QTC Calculation (Bezet) 401 ms    P Axis 53 degrees    R Axis 50 degrees    T Axis 43 degrees    IHS MUSE NARRATIVE AND IMPRESSION       NORMAL SINUS RHYTHM  NONSPECIFIC T WAVE ABNORMALITY  ABNORMAL ECG  WHEN COMPARED WITH ECG OF  02-Apr-2021 08:51,  (UNCONFIRMED)  NO SIGNIFICANT CHANGE WAS FOUND         CRITICAL CARE/PROCEDURES    Procedures      DIAGNOSIS      Diagnosis:  Final diagnoses:   Costochondritis, acute       Disposition:  ED Disposition       ED Disposition   Discharge    Condition   --    Date/Time   Wed Apr 21, 2021  1:27 PM    Comment   Patriciaann Clan Weitzman discharge to home/self care.    Condition at disposition: Stable                 Prescriptions:  Discharge Medication List as of 04/21/2021  1:48 PM        START taking these medications    Details   lidocaine (LIDODERM) 5 % Place 1 patch onto the skin every 24 hours Remove & Discard patch within 12 hours or as directed by MD, Starting Wed 04/21/2021, E-Rx           CONTINUE these medications which have NOT CHANGED    Details   amLODIPine (NORVASC) 10 MG tablet Take 1 tablet (10 mg total) by mouth daily, Starting Fri 04/02/2021, E-Rx      anastrozole (ARIMIDEX) 1 MG tablet Take 1 tablet (1 mg total) by mouth daily, Starting Fri 01/19/2018, Normal      Apoaequorin (PREVAGEN PO) Take by mouth daily, Historical Med      aspirin 81 MG chewable tablet Chew 1 tablet (81 mg total) by mouth daily, Starting Wed 01/29/2020, E-Rx      desonide (DESOWEN) 0.05 % ointment Apply topically 2 (two) times daily, Starting Sun 05/10/2020, E-Rx      diclofenac Sodium (VOLTAREN) 1 % Gel topical gel Apply  2 g topically 4 (four) times daily, Starting Thu 03/04/2021, Normal      ezetimibe (ZETIA) 10 MG tablet Take 1 tablet (10 mg total) by mouth daily,  Starting Tue 10/20/2020, E-Rx      Ferrous Sulfate (IRON PO) Take by mouth, Historical Med      gabapentin (NEURONTIN) 100 MG capsule Take 1 capsule (100 mg total) by mouth 2 (two) times daily as needed (Neuropathy), Starting Tue 11/24/2020, Print      guaiFENesin (MUCINEX) 600 MG 12 hr tablet Take 2 tablets (1,200 mg total) by mouth 2 (two) times daily, Starting Sun 08/30/2020, E-Rx      lactobacillus/streptococcus (RISAQUAD) Cap Take 1 capsule by mouth daily, Starting Sat 03/21/2020, E-Rx      levothyroxine (SYNTHROID) 25 MCG tablet Take 1 tablet (25 mcg total) by mouth every morning, Starting Wed 01/29/2020, E-Rx      lisinopril (ZESTRIL) 40 MG tablet Take 1 tablet (40 mg total) by mouth daily, Starting Fri 04/02/2021, E-Rx      magnesium oxide (MAG-OX) 400 MG tablet Take 1 tablet (400 mg total) by mouth daily, Starting Fri 05/29/2020, E-Rx      meclizine (ANTIVERT) 25 MG tablet Take 1 tablet (25 mg total) by mouth 3 (three) times daily as needed for Dizziness, Starting Thu 08/08/2019, E-Rx      Multiple Vitamins-Minerals (MULTIVITAMIN WITH MINERALS) tablet Take 1 tablet by mouth every morning    , Historical Med      pantoprazole (PROTONIX) 40 MG tablet Take 1 tablet (40 mg total) by mouth every morning before breakfast, Starting Wed 01/29/2020, E-Rx      rosuvastatin (CRESTOR) 40 MG tablet Take 0.5 tablets (20 mg total) by mouth daily, Starting Mon 09/28/2020, Print      vitamin B-1 (THIAMINE) 100 MG tablet Take 100 mg by mouth every morning., Historical Med                Loetta Rough, DO  04/22/21 0701

## 2021-04-28 ENCOUNTER — Ambulatory Visit (INDEPENDENT_AMBULATORY_CARE_PROVIDER_SITE_OTHER): Payer: Medicare Other | Admitting: Cardiology

## 2021-04-28 ENCOUNTER — Encounter (INDEPENDENT_AMBULATORY_CARE_PROVIDER_SITE_OTHER): Payer: Self-pay

## 2021-04-28 DIAGNOSIS — R001 Bradycardia, unspecified: Secondary | ICD-10-CM

## 2021-04-28 DIAGNOSIS — I471 Supraventricular tachycardia: Secondary | ICD-10-CM

## 2021-04-28 NOTE — Progress Notes (Unsigned)
[facility] - [facility] Cardiac Telemetry      BARB SHEAR         [id number], female, [birth year]    Ordering Physician:  Denyse Dago, MD  Primary Physician:  Rondel Baton, MD  Primary Cardiologist:  Denyse Dago, MD    Indication:  Bradycardia    Data     Hook up date:  04/02/2021        Disconnect date:  04/03/2021  Total days of monitoring:  24 hours  Resulted date:  May 02, 2021  Quality:  good   Tech comments:      Findings     Baseline rhythm:  Sinus rhythm      Impression     Normal sinus rhythm with slowest heart rate 44 bpm at 9 AM.  Rare PAC and PVC  3 SVT from 3-7 beats fastest rate 125 bpm      Interpreted and electronically signed by:  Denyse Dago MD [facility]

## 2021-05-02 ENCOUNTER — Encounter (INDEPENDENT_AMBULATORY_CARE_PROVIDER_SITE_OTHER): Payer: Self-pay | Admitting: Cardiology

## 2021-05-22 ENCOUNTER — Inpatient Hospital Stay: Payer: Medicare Other | Admitting: Pain Medicine

## 2021-05-22 ENCOUNTER — Emergency Department: Payer: Medicare Other

## 2021-05-22 ENCOUNTER — Encounter
Admission: EM | Disposition: A | Discharge status: Skilled Nursing Facility | Payer: Self-pay | Source: Home / Self Care | Attending: Internal Medicine

## 2021-05-22 ENCOUNTER — Inpatient Hospital Stay
Admission: EM | Admit: 2021-05-22 | Discharge: 2021-06-01 | DRG: 330 | Disposition: A | Discharge status: Skilled Nursing Facility | Payer: Medicare Other | Attending: Internal Medicine | Admitting: Internal Medicine

## 2021-05-22 DIAGNOSIS — F4321 Adjustment disorder with depressed mood: Secondary | ICD-10-CM | POA: Diagnosis not present

## 2021-05-22 DIAGNOSIS — R197 Diarrhea, unspecified: Secondary | ICD-10-CM | POA: Diagnosis not present

## 2021-05-22 DIAGNOSIS — I10 Essential (primary) hypertension: Secondary | ICD-10-CM | POA: Diagnosis present

## 2021-05-22 DIAGNOSIS — E039 Hypothyroidism, unspecified: Secondary | ICD-10-CM | POA: Diagnosis present

## 2021-05-22 DIAGNOSIS — Z860100 Personal history of colon polyps, unspecified: Secondary | ICD-10-CM

## 2021-05-22 DIAGNOSIS — K559 Vascular disorder of intestine, unspecified: Secondary | ICD-10-CM | POA: Diagnosis present

## 2021-05-22 DIAGNOSIS — Z20822 Contact with and (suspected) exposure to covid-19: Secondary | ICD-10-CM | POA: Diagnosis present

## 2021-05-22 DIAGNOSIS — E782 Mixed hyperlipidemia: Secondary | ICD-10-CM | POA: Diagnosis present

## 2021-05-22 DIAGNOSIS — N179 Acute kidney failure, unspecified: Secondary | ICD-10-CM | POA: Diagnosis not present

## 2021-05-22 DIAGNOSIS — Z79890 Hormone replacement therapy (postmenopausal): Secondary | ICD-10-CM

## 2021-05-22 DIAGNOSIS — R42 Dizziness and giddiness: Secondary | ICD-10-CM

## 2021-05-22 DIAGNOSIS — Z853 Personal history of malignant neoplasm of breast: Secondary | ICD-10-CM

## 2021-05-22 DIAGNOSIS — D638 Anemia in other chronic diseases classified elsewhere: Secondary | ICD-10-CM | POA: Diagnosis present

## 2021-05-22 DIAGNOSIS — R531 Weakness: Secondary | ICD-10-CM | POA: Diagnosis present

## 2021-05-22 DIAGNOSIS — Z79811 Long term (current) use of aromatase inhibitors: Secondary | ICD-10-CM

## 2021-05-22 DIAGNOSIS — K55039 Acute (reversible) ischemia of large intestine, extent unspecified: Principal | ICD-10-CM | POA: Diagnosis present

## 2021-05-22 DIAGNOSIS — K529 Noninfective gastroenteritis and colitis, unspecified: Secondary | ICD-10-CM | POA: Diagnosis present

## 2021-05-22 DIAGNOSIS — C50811 Malignant neoplasm of overlapping sites of right female breast: Secondary | ICD-10-CM

## 2021-05-22 DIAGNOSIS — Z7982 Long term (current) use of aspirin: Secondary | ICD-10-CM

## 2021-05-22 DIAGNOSIS — Z5331 Laparoscopic surgical procedure converted to open procedure: Secondary | ICD-10-CM

## 2021-05-22 DIAGNOSIS — K565 Intestinal adhesions [bands], unspecified as to partial versus complete obstruction: Secondary | ICD-10-CM | POA: Diagnosis present

## 2021-05-22 DIAGNOSIS — R1031 Right lower quadrant pain: Secondary | ICD-10-CM

## 2021-05-22 HISTORY — PX: EXPLORATORY LAPAROTOMY: SHX4079

## 2021-05-22 HISTORY — PX: LAPAROTOMY, COLECTOMY, RIGHT: SHX4598

## 2021-05-22 HISTORY — PX: LAPAROSCOPY, DIAGNOSTIC: SHX4584

## 2021-05-22 LAB — COMPREHENSIVE METABOLIC PANEL
ALT: 11 U/L (ref 0–55)
AST (SGOT): 23 U/L (ref 5–34)
Albumin/Globulin Ratio: 1.5 (ref 0.9–2.2)
Albumin: 4.1 g/dL (ref 3.5–5.0)
Alkaline Phosphatase: 80 U/L (ref 37–117)
Anion Gap: 11 (ref 5.0–15.0)
BUN: 18 mg/dL (ref 7.0–19.0)
Bilirubin, Total: 0.3 mg/dL (ref 0.2–1.2)
CO2: 26 mEq/L (ref 22–29)
Calcium: 9.5 mg/dL (ref 7.9–10.2)
Chloride: 103 mEq/L (ref 100–111)
Creatinine: 1.4 mg/dL — ABNORMAL HIGH (ref 0.6–1.0)
Globulin: 2.7 g/dL (ref 2.0–3.6)
Glucose: 152 mg/dL — ABNORMAL HIGH (ref 70–100)
Potassium: 4.3 mEq/L (ref 3.5–5.1)
Protein, Total: 6.8 g/dL (ref 6.0–8.3)
Sodium: 140 mEq/L (ref 136–145)

## 2021-05-22 LAB — CBC AND DIFFERENTIAL
Absolute NRBC: 0 10*3/uL (ref 0.00–0.00)
Basophils Absolute Automated: 0.03 10*3/uL (ref 0.00–0.08)
Basophils Automated: 0.2 %
Eosinophils Absolute Automated: 0.01 10*3/uL (ref 0.00–0.44)
Eosinophils Automated: 0.1 %
Hematocrit: 33.3 % — ABNORMAL LOW (ref 34.7–43.7)
Hgb: 10.6 g/dL — ABNORMAL LOW (ref 11.4–14.8)
Immature Granulocytes Absolute: 0.05 10*3/uL (ref 0.00–0.07)
Immature Granulocytes: 0.3 %
Lymphocytes Absolute Automated: 1.28 10*3/uL (ref 0.42–3.22)
Lymphocytes Automated: 8.6 %
MCH: 27.4 pg (ref 25.1–33.5)
MCHC: 31.8 g/dL (ref 31.5–35.8)
MCV: 86 fL (ref 78.0–96.0)
MPV: 10.8 fL (ref 8.9–12.5)
Monocytes Absolute Automated: 0.55 10*3/uL (ref 0.21–0.85)
Monocytes: 3.7 %
Neutrophils Absolute: 12.89 10*3/uL — ABNORMAL HIGH (ref 1.10–6.33)
Neutrophils: 87.1 %
Nucleated RBC: 0 /100 WBC (ref 0.0–0.0)
Platelets: 324 10*3/uL (ref 142–346)
RBC: 3.87 10*6/uL — ABNORMAL LOW (ref 3.90–5.10)
RDW: 13 % (ref 11–15)
WBC: 14.81 10*3/uL — ABNORMAL HIGH (ref 3.10–9.50)

## 2021-05-22 LAB — URINALYSIS REFLEX TO MICROSCOPIC EXAM - REFLEX TO CULTURE
Bilirubin, UA: NEGATIVE
Blood, UA: NEGATIVE
Glucose, UA: NEGATIVE
Ketones UA: 5 — AB
Nitrite, UA: NEGATIVE
Protein, UR: NEGATIVE
Specific Gravity UA: 1.01 (ref 1.001–1.035)
Urine pH: 6 (ref 5.0–8.0)
Urobilinogen, UA: NEGATIVE mg/dL (ref 0.2–2.0)

## 2021-05-22 LAB — ABO/RH: ABO Rh: O POS

## 2021-05-22 LAB — COVID-19 (SARS-COV-2): SARS CoV-2: NEGATIVE

## 2021-05-22 LAB — LACTIC ACID, PLASMA: Lactic Acid: 1.2 mmol/L (ref 0.2–2.0)

## 2021-05-22 LAB — GFR: EGFR: 44.2

## 2021-05-22 LAB — TYPE AND SCREEN
AB Screen Gel: NEGATIVE
ABO Rh: O POS

## 2021-05-22 LAB — TROPONIN I: Troponin I: <0.01 ng/mL (ref 0.00–0.05)

## 2021-05-22 LAB — LIPASE: Lipase: 16 U/L (ref 8–78)

## 2021-05-22 LAB — PT/INR
PT INR: 1.1 (ref 0.9–1.1)
PT: 12.1 s (ref 10.1–12.9)

## 2021-05-22 SURGERY — LAPAROSCOPY, DIAGNOSTIC
Anesthesia: Anesthesia General | Site: Abdomen | Laterality: Right | Wound class: Clean

## 2021-05-22 MED ORDER — INDOCYANINE GREEN 25 MG IV SOLR
INTRAVENOUS | Status: AC
Start: 2021-05-22 — End: ?
  Filled 2021-05-22: qty 25

## 2021-05-22 MED ORDER — LIDOCAINE HCL (PF) 2 % IJ SOLN
INTRAMUSCULAR | Status: AC
Start: 2021-05-22 — End: ?
  Filled 2021-05-22: qty 5

## 2021-05-22 MED ORDER — HEPARIN SODIUM (PORCINE) 5000 UNIT/ML IJ SOLN
5000.0000 [IU] | INTRAMUSCULAR | Status: DC
Start: 2021-05-22 — End: 2021-06-01

## 2021-05-22 MED ORDER — FENTANYL CITRATE (PF) 50 MCG/ML IJ SOLN (WRAP)
INTRAMUSCULAR | Status: AC
Start: 2021-05-22 — End: ?
  Filled 2021-05-22: qty 1

## 2021-05-22 MED ORDER — ALBUMIN HUMAN/BIOSIMILIAR 5% IV SOLN (WRAP)
INTRAVENOUS | Status: AC
Start: 2021-05-22 — End: ?
  Filled 2021-05-22: qty 250

## 2021-05-22 MED ORDER — INDOCYANINE GREEN 25 MG IV SOLR
5.0000 mg | INTRAVENOUS | Status: AC | PRN
Start: 2021-05-22 — End: 2021-05-23
  Administered 2021-05-23: 01:00:00 1 mg via INTRAVENOUS

## 2021-05-22 MED ORDER — PROPOFOL 10 MG/ML IV EMUL (WRAP)
INTRAVENOUS | Status: AC
Start: 2021-05-22 — End: ?
  Filled 2021-05-22: qty 20

## 2021-05-22 MED ORDER — BUPIVACAINE HCL (PF) 0.25 % IJ SOLN
INTRAMUSCULAR | Status: AC
Start: 2021-05-22 — End: ?
  Filled 2021-05-22: qty 30

## 2021-05-22 MED ORDER — IOHEXOL 350 MG/ML IV SOLN
100.0000 mL | Freq: Once | INTRAVENOUS | Status: AC | PRN
Start: 2021-05-22 — End: 2021-05-22
  Administered 2021-05-22: 21:00:00 100 mL via INTRAVENOUS

## 2021-05-22 MED ORDER — HYDROMORPHONE HCL 1 MG/ML IJ SOLN
0.5000 mg | Freq: Once | INTRAMUSCULAR | Status: AC
Start: 2021-05-22 — End: 2021-05-22
  Administered 2021-05-22: 21:00:00 0.5 mg via INTRAVENOUS
  Filled 2021-05-22: qty 1

## 2021-05-22 MED ORDER — SUCCINYLCHOLINE CHLORIDE 20 MG/ML IJ SOLN
INTRAMUSCULAR | Status: AC
Start: 2021-05-22 — End: ?
  Filled 2021-05-22: qty 10

## 2021-05-22 MED ORDER — ONDANSETRON HCL 4 MG/2ML IJ SOLN
4.0000 mg | Freq: Once | INTRAMUSCULAR | Status: AC
Start: 2021-05-22 — End: 2021-05-22
  Administered 2021-05-22: 21:00:00 4 mg via INTRAVENOUS
  Filled 2021-05-22: qty 2

## 2021-05-22 MED ORDER — PHENYLEPHRINE HCL 10 MG/ML IV SOLN (WRAP)
Status: AC
Start: 2021-05-22 — End: ?
  Filled 2021-05-22: qty 1

## 2021-05-22 MED ORDER — SODIUM CHLORIDE 0.9 % IV BOLUS
1000.0000 mL | Freq: Once | INTRAVENOUS | Status: AC
Start: 2021-05-22 — End: 2021-05-22
  Administered 2021-05-22: 20:00:00 1000 mL via INTRAVENOUS

## 2021-05-22 MED ORDER — ROCURONIUM BROMIDE 50 MG/5ML IV SOLN
INTRAVENOUS | Status: AC
Start: 2021-05-22 — End: ?
  Filled 2021-05-22: qty 5

## 2021-05-22 MED ORDER — PIPERACILLIN SOD-TAZOBACTAM SO 4.5 (4-0.5) G IV SOLR
4.5000 g | Freq: Once | INTRAVENOUS | Status: AC
Start: 2021-05-22 — End: 2021-05-22
  Administered 2021-05-22: 20:00:00 4.5 g via INTRAVENOUS
  Filled 2021-05-22: qty 20

## 2021-05-22 MED ORDER — HEPARIN SODIUM (PORCINE) 5000 UNIT/ML IJ SOLN
INTRAMUSCULAR | Status: AC
Start: 2021-05-22 — End: ?
  Filled 2021-05-22: qty 1

## 2021-05-22 MED ORDER — SODIUM CHLORIDE 0.9 % IV BOLUS
1000.0000 mL | Freq: Once | INTRAVENOUS | Status: AC
Start: 2021-05-22 — End: 2021-05-22
  Administered 2021-05-22: 21:00:00 1000 mL via INTRAVENOUS

## 2021-05-22 SURGICAL SUPPLY — 213 items
ADHESIVE SKIN CLOSURE DERMABOND ADVANCED (Skin Closure) ×2
ADHESIVE SKIN CLOSURE DERMABOND ADVANCED .7 ML LIQUID APPLICATOR (Skin Closure) ×2 IMPLANT
ADHESIVE SKNCLS 2 OCTYL CYNCRLT .7ML (Skin Closure) ×1
APPLICATOR ENDOSCOPIC L34 CM SURGIFLO (Applicator) ×2 IMPLANT
APPLICATOR ESCP SURGIFLO 34CM LF STRL (Applicator) ×1
APPLIER IN CLP TI MED LG E-CLP III SUP (Staplers)
APPLIER INTERNAL CLIP MEDIUM LARGE L33 (Staplers)
APPLIER INTERNAL CLIP MEDIUM LARGE L33 CM TITANIUM PISTOL GRIP GLARE (Staplers) IMPLANT
BLADE 10 CLASSIC CARBON STEEL SURGICAL (Blade) ×2 IMPLANT
BLADE 15 BARD-PARKER RIB-BACK SAFETYLOCK (Blade) ×2
BLADE 15 BARD-PARKER RIB-BACK SAFETYLOCK CARBON STEEL SURGICAL (Blade) ×2 IMPLANT
BLADE SRG CBNSTL 10 BP RB-BCK SFLOK LF (Blade) ×1
BLADE SRG CBNSTL 15 BP RB-BCK SFLOK LF (Blade) ×1
CANISTER WOUND DRAINAGE BATTERY 1 TOUCH (Kits) ×2
CANISTER WOUND DRAINAGE BATTERY 1 TOUCH LIGHTWEIGHT PORTABLE PREVENA (Kits) ×2 IMPLANT
COVER TABLE L90 IN X W60 IN REINFORCE (Drape)
COVER TABLE L90 IN X W60 IN REINFORCE HEAVY DUTY MEDLINE SMS (Drape) IMPLANT
COVER TBL SMS 90X60IN LF STRL REINF (Drape)
DISSECTOR L21 CM LIGASURE EXACT 40 D (Cautery) ×2
DISSECTOR L21 CM LIGASURE EXACT 40 D L20.6 MM L19.8 MM W4 MM CURVE JAW (Cautery) ×2 IMPLANT
DISSECTOR SRG 40D 20.6MM 19.8MM 4MM LGSR (Cautery) ×1
DRAIN INCS SIL FLT 20CMX10MM LF STRL (Drain)
DRAIN RADIOPAQUE 3/4 PERFORATION HUBLESS (Drain)
DRAIN RADIOPAQUE 3/4 PERFORATION HUBLESS RELIAVAC L20 CM X W10 MM (Drain) IMPLANT
DRAPE SRG TBRN CNVRT 122X106X77IN LF (Drape) ×3
DRAPE SURGICAL IMPERVIOUS REINFORCEMENT FENESTRATE ABSORBENT ARMBOARD (Drape) ×2 IMPLANT
DRESSING PETRO 3% BI 3BRM GZE XR 8X1IN (Dressing) ×1
DRESSING PETROLATUM XEROFORM L8 IN X W1 (Dressing) ×2
DRESSING PETROLATUM XEROFORM L8 IN X W1 IN 3% BISMUTH TRIBROMOPHENATE (Dressing) ×2 IMPLANT
DRESSING SCR TGDRM 4.5X3.5IN LF STRL FLM (Dressing) ×3
DRESSING SECUREMENT TEGADERM L4 1/2 IN X (Dressing) ×6
DRESSING SECUREMENT TEGADERM L4 1/2 IN X W3 1/2 IN INTRAVENOUS FILM (Dressing) ×6 IMPLANT
ELECTRODE ADULT PATIENT RETURN L9 FT REM POLYHESIVE ACRYLIC FOAM (Procedure Accessories) ×2 IMPLANT
ELECTRODE ELECTROSURGICAL BLADE L6.5 IN (Cautery)
ELECTRODE ELECTROSURGICAL BLADE L6.5 IN OD3/32 IN VALLEYLAB STAINLESS (Cautery) IMPLANT
ELECTRODE ELECTROSURGICAL BLADE PENCIL L10 FT OD3/8 IN PLUMEPEN ELITE (Other) ×2 IMPLANT
ELECTRODE ESURG BLDE PNCL PLUMEPEN ELT (Other) ×3
ELECTRODE ESURG SS BLDE VLAB 3/32IN 6.5 (Cautery)
ELECTRODE PATIENT RETURN L9 FT VALLEYLAB (Procedure Accessories) ×2
ELECTRODE PT RTN RM PHSV ACRL FM C30- LB (Procedure Accessories) ×1
GLOVE SRG 6.5 BGL SRG LTX STRL PF BEAD (Glove) ×1
GLOVE SRG 7 BGL SRG LTX STRL PF BEAD CUF (Glove) ×1
GLOVE SURGICAL 6.5 BIOGEL SURGEONS (Glove) ×2
GLOVE SURGICAL 6.5 BIOGEL SURGEONS POWDER FREE BEAD CUFF MICRO ROUGHEN (Glove) ×2 IMPLANT
GLOVE SURGICAL 7 BIOGEL SURGEONS POWDER (Glove) ×2
GLOVE SURGICAL 7 BIOGEL SURGEONS POWDER FREE BEAD CUFF TEXTURE SURFACE (Glove) ×2 IMPLANT
GOWN SRG LG SMARTGOWN LF STRL LVL 4 (Gown) ×3
GOWN SRG XL SMARTGOWN LF STRL LVL 4 (Gown) ×2
GOWN SRGCL LG LEVEL 4 BRTHBL STRL LF DSPSBL SMARTGOWN (Gown) ×2 IMPLANT
GOWN SURGICAL XL SMARTGOWN LEVEL 4 (Gown) ×4
GOWN SURGICAL XL SMARTGOWN LEVEL 4 BREATHABLE (Gown) ×4 IMPLANT
HOLDER CATH VLCR UNV CATH-MATE LF ADJ (Procedure Accessories) ×1
HOLDER CATHETER UNIVERSAL ADJUSTABLE (Procedure Accessories) ×2 IMPLANT
INSTRUMENT SUCTION NON VENTED LARGE EYED SHEATH POOLE 0035040 (Tubes) ×2 IMPLANT
IRRIGATOR SUCTION ERGONOMIC HAND PIECE STRYKEFLOW II (Suction) IMPLANT
IRRIGATOR SUCTION STRYKEFLOW 2 (Suction)
KIT HMST GELTN MTRX THRMB 10ML 13CM FLSL (Hemostat) IMPLANT
KIT RM TURNOVER LF NS DISP (Kits) ×3
KIT ROOM TURNOVER NONSTERILE LATEX FREE DISPOSABLE (Kits) ×2 IMPLANT
KIT SRGBSN 2 FOAK STRL LF DISP (Procedure Accessories) ×1
KIT SURGICAL BASIN 2 FOAK (Procedure Accessories) ×2
KIT SURGICAL BASIN 2 FOAK MEDLINE INDUSTRIES, INC. (Procedure Accessories) ×2 IMPLANT
NEEDLE HPO SS PP RW BD 25GA 1.5IN LF (Needles) ×3 IMPLANT
PACK SRG LF STRL GN LAPSCP DISP FFX (Pack) ×1
PACK SURGICAL GEN LAPAROSCOPY FFX (Pack) ×2 IMPLANT
POUCH SPEC RTRVL PU E-CTCH GLD 10MM 34.5 (Laparoscopy Supplies) ×1
POUCH SPECIMEN RETRIEVAL L34.5 CM (Laparoscopy Supplies) ×2
POUCH SPECIMEN RETRIEVAL L34.5 CM ERGONOMIC HANDLE LONG CYLINDRICAL (Laparoscopy Supplies) ×2 IMPLANT
PROTECTOR TISS MED ALEXIS LF FLXB RTRCT (Retractor) ×1
PROTECTOR TISSUE MEDIUM FLEXBLE (Retractor) ×2
PROTECTOR TISSUE MEDIUM FLEXIBLE (Retractor) ×2
RELOAD STAPLER 2 MM 2.5 MM 3 MM L60 MM (Staplers) ×2
RELOAD STAPLER 2 MM 2.5 MM 3 MM L60 MM ENDO GIA TITANIUM MEDIUM (Staplers) ×2 IMPLANT
RELOAD STAPLER 3 MM 3.5 MM 4 MM L60 MM (Staplers) ×6
RELOAD STAPLER 3 MM 3.5 MM 4 MM L60 MM ENDO GIA TITANIUM MEDIUM THICK (Staplers) ×6 IMPLANT
RELOAD STPLR TI 2MM 2.5MM 3MM EGIA 60MM (Staplers) ×1
RELOAD STPLR TI 3MM 3.5MM 4MM EGIA 60MM (Staplers) ×3
RELOADER SINGLE-USE 60-3.5 (Staplers) ×1
RESERVOIR DRAINAGE JACKSON-PRATT BULB SILICONE 100 CC (Drain) IMPLANT
RESERVOIR DRN SIL 100CC BLB JP LF STRL (Drain)
RETRACTOR ALEXIS FLEXIBLE RETRACTION (Retractor) ×2
RETRACTOR TISS SM ALEXIS LF FLXB RTRCT (Retractor) ×1
RETRACTOR TISSUE MEDIUM FLEXIBLE RETRACTION RING ATRAUMATIC SELF (Retractor) ×2 IMPLANT
RETRACTOR TISSUE SMALL FLEXIBLE RETRACTION RING ATRAUMATIC SELF RETAIN (Retractor) ×2 IMPLANT
SEALER/DIVIDER ELECTROSURGICAL L18 CM 14 D 180 D L34 MM CURVE JAW OVAL (Instrument) ×2 IMPLANT
SEALER/DIVIDER ESURG 14D 180D 34MM LGSR (Instrument) ×3
SEALER/DIVIDER LAPAROSCOPIC L23 CM 350 D (Laparoscopy Supplies) ×2
SEALER/DIVIDER LAPAROSCOPIC L23 CM 350 D L18.5 MM MARYLAND CURVE JAW (Laparoscopy Supplies) ×2 IMPLANT
SEALER/DIVIDER LAPAROSCOPIC L37 CM 350 D (Laparoscopy Supplies) ×2
SEALER/DIVIDER LAPAROSCOPIC L37 CM 350 D L18.5 MM MARYLAND CURVE JAW (Laparoscopy Supplies) ×2 IMPLANT
SEALER/DIVIDER LAPSCP 350D 18.5MM LGSR (Laparoscopy Supplies) ×2
SET HIGH FLOW SMOKE EVACUATION (Tubing)
SET HIGH FLOW SMOKE EVACUATION PNEUMOCLEAR TUBING (Tubing) IMPLANT
SET PNEUMOCLEAR TBG HFLO SMK EVAC (Tubing)
SLEEVE CMPR MED KN LGTH KDL SCD 21- IN (Sleeve) ×5
SLEEVE COMPRESSION MEDIUM KNEE LENGTH KENDALL SCD SEQUENTIAL OD21- IN (Sleeve) ×2 IMPLANT
SLEEVE COMPRESSION MEDIUM KNEE LENGTH KENDALL SEQUENTIAL OD21- IN (Sleeve) ×2 IMPLANT
SLEEVE LAPSCP 5MM 100MM EPTH XCL UNV STAB CNN (Procedure Accessories) ×1
SLEEVE LAPSCP UNV EPTH XCL 5MM 100MM (Procedure Accessories) ×2
SLEEVE SCD CMFRT KNEE REG (Sleeve) ×1
SLEEVE TROCAR L100 MM UNIVERSAL STABILITY OD5 MM ENDOPATH XCEL (Procedure Accessories) ×2 IMPLANT
SOLUTION IRR 0.9% NACL 1000ML LF STRL (Irrigation Solutions) ×1
SOLUTION IRRIGATION 0.9% SODIUM CHLORIDE (Irrigation Solutions) ×2
SOLUTION IRRIGATION 0.9% SODIUM CHLORIDE 1000 ML PLASTIC POUR BOTTLE (Irrigation Solutions) ×2 IMPLANT
SOLUTION IV 0.9% NACL 1000ML VFLX LF PLS (IV Solutions) ×1
SOLUTION IV 0.9% SODIUM CHLORIDE PVC (IV Solutions) ×2
SOLUTION IV 0.9% SODIUM CHLORIDE PVC 1000 ML PH 5 PLASTIC CONTAINER (IV Solutions) ×2 IMPLANT
SOLUTION SRGPRP 74% ISPRP 0.7% IOD (Prep) ×1
SOLUTION SURGICAL PREP 26 ML DURAPREP (Prep) ×2
SOLUTION SURGICAL PREP 26 ML DURAPREP 74% ISOPROPYL ALCOHOL 0.7% (Prep) ×2 IMPLANT
SPONGE GAUZE L4 IN X W4 IN 16 PLY (Dressing) ×8
SPONGE GAUZE L4 IN X W4 IN 16 PLY MAXIMUM ABSORBENT USP TYPE VII (Dressing) ×8 IMPLANT
SPONGE GZE CTTN CRTY 4X4IN LF NS 16 PLY (Dressing) ×4
SPONGE LAP 18X18IN PREWASH WHT (Sponge) ×2
SPONGE LAPAROTOMY L18 IN X W18 IN (Sponge) ×4
SPONGE LAPAROTOMY L18 IN X W18 IN PREWASH WHITE (Sponge) ×4 IMPLANT
STAPLER INTNL PVC STD UNV EGIA 12MM (Staplers) ×2
STAPLER SKIN L3.9 MM X W6.9 MM WIDE 35 (Skin Closure)
STAPLER SKIN L3.9 MM X W6.9 MM WIDE 35 COUNT FIX HEAD RATCHET (Skin Closure) IMPLANT
STAPLER SKN SS W PRX PX .58MM 3.9X6.9MM (Skin Closure)
STAPLER THICK TISSUE L60 MM X H3.5 MM (Staplers) ×2
STAPLER THICK TISSUE L60 MM X H3.5 MM ENDOSCOPIC 2 ROW LINEAR CUTTER 2 (Staplers) ×2 IMPLANT
STAPLER THIN VASCULAR TISSUE L6 CM X W4 (Staplers) ×2
STAPLER THIN VASCULAR TISSUE L6 CM X W4 MM OD12 MM ENDOSCOPIC (Staplers) ×2 IMPLANT
STAPLER TISSUE L16 CM X W4 MM OD12 MM (Staplers) ×4
STAPLER TISSUE L16 CM X W4 MM OD12 MM ENDOSCOPIC ENDO GIA PVC INTERNAL (Staplers) ×4 IMPLANT
STPLR EGIA INTNL 6CMX4MM SHRT UNV TI 12 (Staplers) ×1
SUTURE ABS 0 UR-6 VCL 27IN BRD COAT VIOL (Suture) ×3
SUTURE ABS 1 CT PDS2 36IN MFL VIOL (Suture) ×2
SUTURE ABS 1 TP-1 PDS2 96IN MFL LOOP (Suture) ×2
SUTURE ABS 2-0 SH VCL 27IN BRD COAT VIOL (Suture) ×1
SUTURE ABS 3-0 SH PDS2 27IN MFL UD (Suture) ×1
SUTURE ABS 3-0 SH VCL 27IN BRD COAT UD (Suture) ×1
SUTURE ABS 4-0 PS2 MNCRL MTPS 27IN MFL (Suture) ×1
SUTURE COATED VICRYL 0 UR-6 L27 IN BRAID (Suture) ×6 IMPLANT
SUTURE COATED VICRYL 2-0 SH L27 IN BRAID (Suture) ×2
SUTURE COATED VICRYL 2-0 SH L27 IN BRAID COATED VIOLET ABSORBABLE (Suture) ×2 IMPLANT
SUTURE COATED VICRYL 3-0 SH L27 IN BRAID (Suture) ×2 IMPLANT
SUTURE ETHILON BLACK 2-0 FS L18 IN (Suture) ×2
SUTURE ETHILON BLACK 3-0 PS-1 L18 IN (Suture)
SUTURE ETHILON BLACK 3-0 PS-1 L18 IN MONOFILAMENT NONABSORBABLE (Suture) IMPLANT
SUTURE MONOCRYL 4-0 PS-2 L27 IN (Suture) ×2
SUTURE MONOCRYL 4-0 PS-2 L27 IN MONOFILAMENT UNDYED ABSORBABLE (Suture) ×2 IMPLANT
SUTURE NABSB 0 CT1 PRLN 30IN MFL BLU (Suture) ×4
SUTURE NABSB 1 CT1 PRLN 30IN MFL BLU (Suture) ×1
SUTURE NABSB 2-0 FS ETH 18IN MFL BLK (Suture) ×1
SUTURE NABSB 3-0 PS1 ETH MTPS 18IN MFL (Suture)
SUTURE NABSB SLK 0 PRMHND 30IN BRD TIE 6 (Suture) ×2
SUTURE NABSB SLK 2-0 PRMHND 30IN BRD TIE (Suture) ×1
SUTURE NABSB SLK 2-0 SH PRMHND 30IN BRD (Suture)
SUTURE NABSB SLK 3-0 PRMHND 30IN BRD TIE (Suture) ×1
SUTURE NABSB SLK 3-0 SH PRMHND 18IN CR (Suture) ×1
SUTURE NYLON ETHILON BLACK SILVER 2-0 L18 IN L26 MM REVERSE CUTTING FS (Suture) ×2 IMPLANT
SUTURE PDS II 1 CT L36 IN MONOFILAMENT (Suture) ×4
SUTURE PDS II 1 CT L36 IN MONOFILAMENT VIOLET ABSORBABLE (Suture) ×4 IMPLANT
SUTURE PDS II 1 TP-1 L96 IN MONOFILAMENT (Suture) ×4
SUTURE PDS II 1 TP-1 L96 IN MONOFILAMENT LOOP VIOLET ABSORBABLE (Suture) ×4 IMPLANT
SUTURE PDS II 3-0 SH L27 IN MONOFILAMENT (Suture) ×2
SUTURE PDS II 3-0 SH L27 IN MONOFILAMENT UNDYED ABSORBABLE (Suture) ×2 IMPLANT
SUTURE PROLENE BLUE 0 CT-1 L30 IN (Suture) ×8
SUTURE PROLENE BLUE 0 CT-1 L30 IN MONOFILAMENT NONABSORBABLE (Suture) ×8 IMPLANT
SUTURE PROLENE BLUE 1 CT-1 L30 IN (Suture) ×2
SUTURE PROLENE BLUE 1 CT-1 L30 IN MONOFILAMENT NONABSORBABLE (Suture) ×2 IMPLANT
SUTURE SILK PERMA HAND BLACK 0 L30 IN (Suture) ×4
SUTURE SILK PERMA HAND BLACK 0 L30 IN BRAID TIES 6 STRAND PRECUT (Suture) ×4 IMPLANT
SUTURE SILK PERMA HAND BLACK 2-0 L30 IN (Suture) ×2
SUTURE SILK PERMA HAND BLACK 2-0 L30 IN BRAID TIES 12 STRAND PRECUT (Suture) ×2 IMPLANT
SUTURE SILK PERMA HAND BLACK 2-0 SH L30 (Suture)
SUTURE SILK PERMA HAND BLACK 2-0 SH L30 IN BRAID NONABSORBABLE (Suture) IMPLANT
SUTURE SILK PERMA HAND BLACK 3-0 L30 IN (Suture) ×2
SUTURE SILK PERMA HAND BLACK 3-0 SH L18 (Suture) ×2
SUTURE SILK PERMA HAND BLACK 3-0 SH L18 IN CONTROL RELEASE BRAID 8 (Suture) ×2 IMPLANT
SUTURE SILK PERMA HAND BLCK 3-0 L30 IN BRD TIES 12 STRND PRCT NNBSRBBL (Suture) ×2 IMPLANT
SYRINGE 10 ML GRADUATE NONPYROGENIC DEHP (Syringes, Needles) ×2
SYRINGE 10 ML GRADUATE NONPYROGENIC DEHP FREE PVC FREE LOK MEDICAL (Syringes, Needles) ×2 IMPLANT
SYRINGE 30 ML CONCENTRIC TIP GRADUATE (Syringes, Needles)
SYRINGE 30 ML CONCENTRIC TIP GRADUATE NONPYROGENIC DEHP FREE LOK (Syringes, Needles) IMPLANT
SYRINGE IRR TVK 60ML LF STRL LID SFT BLB (Syringes, Needles) ×1
SYRINGE MED 10ML LL LF STRL GRAD N-PYRG (Syringes, Needles) ×1
SYRINGE MED 30ML LL LF STRL CONC TIP (Syringes, Needles)
SYRINGE MEDLINE 60 ML LID SOFT BULB TIP (Syringes, Needles) ×2
SYRINGE MEDLINE 60 ML LID SOFT BULB TIP IRRIGATION TYVEK (Syringes, Needles) ×2 IMPLANT
SYSTEM NEG PRSS 0.019% IONIC SLVR 150ML (Kits) ×1
SYSTEM NEG PRSS 45ML 125 MMHG PREVENA (Dressing) ×1
SYSTEM NEGATIVE PRESSURE DRESSING (Dressing) ×2
SYSTEM NEGATIVE PRESSURE L20 CM 125 MMHG DRESSING INDICATOR CANISTER (Dressing) ×2 IMPLANT
TAPE MEDIPORE NLTX 4IN X 2YD (Tape) ×3 IMPLANT
TIP SCT PLS STD YNKR LF STRL TUBE FLXB (Suction) ×1
TIP SUCTION MEDLINE STANDARD PLASTIC (Suction) ×2
TIP SUCTION STANDARD PLASTIC TUBE FLEXIBLE TRANSPARENT SLIP RESISTANT (Suction) ×2 IMPLANT
TOWEL L27 IN X W17 IN COTTON STANDARD (Procedure Accessories) ×4
TOWEL OR L27 IN X W17 IN STANDARD PREWASH DELINT ABSORBENT COTTON BLUE (Procedure Accessories) ×4 IMPLANT
TOWEL SRG CTTN STD 27X17IN LF STRL (Procedure Accessories) ×2
TRAY CATH NTR RBR DDRGL 5CC BARD LBRCTH (Catheter Urine) ×3
TRAY CATHETERIZATION OD16 FR FOLEY DRAINAGE BAG ANTIREFLUX CHAMBER (Catheter Urine) ×2 IMPLANT
TRAY OR GENERAL MVH (Pack) ×1
TRAY SRG OR GENERAL IMVH (Pack) ×2
TRAY SURGICAL OR (Pack) ×2 IMPLANT
TROCAR LAPAROSCOPIC SMOOTH SLEEVE BLUNT TIP OBTURATOR PISTOL HANDLE (Laparoscopy Supplies) ×2 IMPLANT
TROCAR LAPAROSCOPIC SMOOTH SLV BLNT  12MM (Laparoscopy Supplies) ×2
TROCAR LAPSCP EPTH XCL 12MM 100MM STRL (Laparoscopy Supplies) ×1
TROCAR LAPSCP EPTH XCL 5MM 100MM LF STRL (Laparoscopy Supplies) ×3
TROCAR OD5 MM L100 MM BLADELESS STABLE SLEEVE ENDOPATH XCEL ENDOSCOPIC (Laparoscopy Supplies) ×2 IMPLANT
TUBE NG PVC S SMP 16FR 48IN LF STRL (Tubing) ×3
TUBE OD16 FR INTEGRAL FUNNEL CONNECTOR 2 LUMEN 5 IN 1 ADAPTER (Tubing) ×2 IMPLANT
TUBING INSFL PNEUMOCLEAR SET HFLO (Endoscopic Supplies) ×1
TUBING INSUFFLATION SET HIGH FLOW (Endoscopic Supplies) ×2
TUBING INSUFFLATION SET HIGH FLOW PNEUMOCLEAR (Endoscopic Supplies) ×2 IMPLANT
TUBING MEDI-VAC NONCND 20FT (Tubing) ×1
TUBING POOLE SUCTION DEVICE (Tubes) ×3
TUBING SCT IRR (Suction)
TUBING SUCTION ID1/4 IN L20 FT RIGID (Tubing) ×2
TUBING SUCTION ID1/4 IN L20 FT RIGID MALE TO MALE CONNECTOR MEDI-VAC (Tubing) ×2 IMPLANT

## 2021-05-22 NOTE — ED Notes (Signed)
[facility]  ED NURSING NOTE FOR THE RECEIVING INPATIENT NURSE   ED NURSE Valorie Roosevelt [id number]   ED CHARGE RN Josette   ADMISSION INFORMATION   Emily [name] is a 75 y.o. female admitted with an ED diagnosis of:    1. Small bowel ischemia    2. Right lower quadrant abdominal pain      None       Allergies: Morphine and Morphine and related   Holding Orders confirmed? No   Belongings Documented? No   Home medications sent to pharmacy confirmed? No   NURSING CARE   Patient Comes From:   Mental Status: Home Independent  alert   ADL: Independent with all ADLs   Ambulation: no difficulty   Pertinent Information  and Safety Concerns: Fall Risk      CT / NIH   CT Head ordered on this patient?  No   NIH/Dysphagia assessment done prior to admission? No   VITAL SIGNS (at the time of this note)      Vitals:    05/22/21 2109   BP: 147/66   Pulse: 82   Resp: 16   Temp:    SpO2: 98%

## 2021-05-22 NOTE — Anesthesia Preprocedure Evaluation (Signed)
Anesthesia Evaluation    AIRWAY    Mallampati: III    TM distance: >3 FB  Neck ROM: full  Mouth Opening:full   CARDIOVASCULAR    cardiovascular exam normal, regular and normal       DENTAL    no notable dental hx     PULMONARY    pulmonary exam normal and clear to auscultation     OTHER FINDINGS                  Relevant Problems   PULMONARY   (+) SOB (shortness of breath)      NEURO/PSYCH   (+) History of breast cancer   (+) History of hypothyroidism      CARDIO   (+) Hypertension   (+) Hypertensive emergency   (+) Small bowel ischemia      ENDO   (+) Hypothyroid       PSS Anesthesia Comments: Hypertension; hyperlipidemia        Anesthesia Plan    ASA 2     general                     intravenous induction   Detailed anesthesia plan: general endotracheal and general IV        Post op pain management: per surgeon    informed consent obtained    ECG reviewed  pertinent labs reviewed             Signed by: Delma Freeze, MD 05/22/21 11:05 PM

## 2021-05-22 NOTE — ED Provider Notes (Signed)
EMERGENCY DEPARTMENT NOTE     Patient initially seen and examined at   ED PHYSICIAN ASSIGNED       Date/Time Event User Comments    05/22/21 1917 Physician Assigned Emilie Rutter, MD assigned as Attending     HISTORY OF PRESENT ILLNESS   Patient Name: Emily [name]   Medical Record Number: [id number]  AGE: 75 y.o.  DOB: Mar 05, 1946    Chief Complaint: Right lower quadrant abdominal pain     Emily [name] is a 75 y.o. female who presents sudden onset abdominal pain that started this morning.  Patient describes the pain as situated in the right lower quadrant associated with nausea and vomiting.  Patient also had some diarrhea and cramping today.    Location: Right lower quadrant  Onset: Earlier today  Duration: Ongoing and worsening  Severity: Moderate to severe  Modifying Factors: Food  Associated With: Nausea and dizziness    Historian: Patient  Translator Used: No  MEDICAL HISTORY     Past Medical History:  Past Medical History:   Diagnosis Date    Breast cancer 2014    right breast mastectomy    Exercise-induced angina     negative work up    GERD (gastroesophageal reflux disease)     Headache     resolved    Hyperlipidemia     Hypertension     Hypothyroidism     Low back pain     Malignant neoplasm of overlapping sites of right female breast 10/19/2015    Syncope and collapse     Chronic Vertigo    Vertigo        Past Surgical History:  Past Surgical History:   Procedure Laterality Date    COLONOSCOPY  2021    COLONOSCOPY, POLYPECTOMY N/A 07/21/2020    Procedure: COLONOSCOPY, POLYPECTOMY;  Surgeon: Einar Grad, MD;  Location: [facility];  Service: Gastroenterology;  Laterality: N/A;    EGD, BIOPSY N/A 07/21/2020    Procedure: EGD, BIOPSY;  Surgeon: Einar Grad, MD;  Location: [facility];  Service: Gastroenterology;  Laterality: N/A;    HYSTERECTOMY      MASTECTOMY Right 2015       Social History:  Social History     Socioeconomic History    Marital status: Widowed   Tobacco Use    Smoking status:  Never    Smokeless tobacco: Never   Vaping Use    Vaping Use: Never used   Substance and Sexual Activity    Alcohol use: Not Currently     Comment: occ    Drug use: No    Sexual activity: Not Currently       Family History:  Family History   Problem Relation Age of Onset    Breast cancer Neg Hx        Outpatient Medication:  Current Discharge Medication List        CONTINUE these medications which have NOT CHANGED    Details   amLODIPine (NORVASC) 10 MG tablet Take 1 tablet (10 mg total) by mouth daily  Qty: 90 tablet, Refills: 1    Associated Diagnoses: Essential hypertension      anastrozole (ARIMIDEX) 1 MG tablet Take 1 tablet (1 mg total) by mouth daily  Qty: 90 tablet, Refills: 3    Associated Diagnoses: Malignant neoplasm of overlapping sites of right breast in female, estrogen receptor positive      aspirin 81 MG chewable tablet Chew 1 tablet (81  mg total) by mouth daily  Qty: 90 tablet, Refills: 0      ezetimibe (ZETIA) 10 MG tablet Take 1 tablet (10 mg total) by mouth daily  Qty: 90 tablet, Refills: 3    Associated Diagnoses: Pure hypercholesterolemia      gabapentin (NEURONTIN) 100 MG capsule Take 1 capsule (100 mg total) by mouth 2 (two) times daily as needed (Neuropathy)  Qty: 20 capsule, Refills: 0      guaiFENesin (MUCINEX) 600 MG 12 hr tablet Take 2 tablets (1,200 mg total) by mouth 2 (two) times daily  Qty: 14 tablet, Refills: 0      lactobacillus/streptococcus (RISAQUAD) Cap Take 1 capsule by mouth daily  Qty: 10 capsule, Refills: 0      levothyroxine (SYNTHROID) 25 MCG tablet Take 1 tablet (25 mcg total) by mouth every morning  Qty: 30 tablet, Refills: 0      lisinopril (ZESTRIL) 40 MG tablet Take 1 tablet (40 mg total) by mouth daily  Qty: 90 tablet, Refills: 1    Associated Diagnoses: Essential hypertension      magnesium oxide (MAG-OX) 400 MG tablet Take 1 tablet (400 mg total) by mouth daily  Qty: 30 tablet, Refills: 0      meclizine (ANTIVERT) 25 MG tablet Take 1 tablet (25 mg total) by mouth  3 (three) times daily as needed for Dizziness  Qty: 10 tablet, Refills: 0      pantoprazole (PROTONIX) 40 MG tablet Take 1 tablet (40 mg total) by mouth every morning before breakfast  Qty: 30 tablet, Refills: 0      rosuvastatin (CRESTOR) 40 MG tablet Take 0.5 tablets (20 mg total) by mouth daily  Qty: 90 tablet, Refills: 3    Associated Diagnoses: Essential hypertension; Mixed hyperlipidemia      Apoaequorin (PREVAGEN PO) Take by mouth daily      desonide (DESOWEN) 0.05 % ointment Apply topically 2 (two) times daily  Qty: 15 g, Refills: 0      diclofenac Sodium (VOLTAREN) 1 % Gel topical gel Apply 2 g topically 4 (four) times daily  Qty: 100 g, Refills: 0      Ferrous Sulfate (IRON PO) Take by mouth      lidocaine (LIDODERM) 5 % Place 1 patch onto the skin every 24 hours Remove & Discard patch within 12 hours or as directed by MD  Qty: 30 patch, Refills: 0      Multiple Vitamins-Minerals (MULTIVITAMIN WITH MINERALS) tablet Take 1 tablet by mouth every morning          vitamin B-1 (THIAMINE) 100 MG tablet Take 100 mg by mouth every morning.               REVIEW OF SYSTEMS   Review of Systems   Constitutional:  Negative for chills and fatigue.   HENT:  Negative for ear pain and sore throat.    Eyes:  Negative for pain and discharge.   Respiratory:  Negative for cough and chest tightness.    Cardiovascular:  Negative for chest pain and palpitations.   Gastrointestinal:  Positive for abdominal pain and nausea. Negative for constipation and vomiting.   Genitourinary:  Negative for dysuria and hematuria.   Musculoskeletal:  Negative for back pain and myalgias.   Skin:  Negative for rash and wound.   Neurological:  Negative for seizures and syncope.      PHYSICAL EXAM     ED Triage Vitals [05/22/21 1910]   Enc Vitals Group  BP (!) 81/45      Heart Rate 75      Resp Rate 18      Temp 97.9 F (36.6 C)      Temp Source Oral      SpO2 99 %      Weight 70 kg      Height 1.6 m      Head Circumference       Peak Flow        Pain Score 10      Pain Loc       Pain Edu?       Excl. in GC?        Physical Exam  Constitutional:       Appearance: Normal appearance.   HENT:      Head: Normocephalic and atraumatic.      Right Ear: External ear normal.      Left Ear: External ear normal.      Mouth/Throat:      Mouth: Mucous membranes are moist.   Eyes:      Extraocular Movements: Extraocular movements intact.      Pupils: Pupils are equal, round, and reactive to light.   Cardiovascular:      Rate and Rhythm: Normal rate and regular rhythm.   Pulmonary:      Effort: Pulmonary effort is normal.      Breath sounds: Normal breath sounds. No wheezing or rhonchi.   Chest:      Chest wall: No tenderness.   Abdominal:      General: Abdomen is flat. Bowel sounds are normal. There is no distension.      Palpations: Abdomen is soft.      Tenderness: There is abdominal tenderness in the right lower quadrant. There is no rebound. Positive signs include Rovsing's sign and McBurney's sign.   Musculoskeletal:         General: No swelling or tenderness. Normal range of motion.      Cervical back: Normal range of motion and neck supple.      Right lower leg: No edema.      Left lower leg: No edema.   Skin:     General: Skin is warm and dry.   Neurological:      General: No focal deficit present.      Mental Status: She is alert and oriented to person, place, and time.         CARDIAC STUDIES    The following cardiac studies were independently interpreted by the Emergency Medicine Physician.  For full cardiac study results please see chart.    Monitor Strip:  Sinus rhythm at 77 BPM with no ectopy.   EKG Interpretation:  The EKG was reviewed, interpreted and analyzed by me, Dr. Macky Lower  EKG: Sinus Rhythm at 71 BPM, no significant ST/T elevation, no QT prolongation, no blocks, as interpreted by me.     EMERGENCY IMAGING STUDIES    The following imagine studies were independently interpreted by me (emergency physician):    RADIOLOGY IMAGING STUDIES      CT  Abd/ Pelvis with IV Contrast   Final Result      1.  There is increased wall thickening of the cecum and ascending colon   with hypoenhancement. A new small focus of gas is seen in the mesenteric   draining vein with new tiny foci of gas in the left hepatic lobe that   are favored to reflect portal venous gas. Findings  are concerning for   bowel ischemia.   2.  Faint areas of hypoenhancement in the kidneys bilaterally can be   seen with pyelonephritis, recommend correlation with urinalysis.   3.  Subcentimeter pulmonary nodules measuring up to 3 mm. Follow-up   should be obtained based on Fleischner guidelines.   4.  The appendix is not visualized.   5.  Ancillary findings as described above.      Findings were communicated to Dr. Delphina Cahill at 05/22/2021 9:00 PM.      Ewing Schlein MD, MD    05/22/2021 9:05 PM              PULSE OXIMETRY    Oxygen Saturation by Pulse Oximetry: 94%  Interventions: none  Interpretation:  Adequate    EMERGENCY DEPT. MEDICATIONS      ED Medication Orders (From admission, onward)      Start Ordered     Status Ordering Provider    05/22/21 2122 05/22/21 2121  sodium chloride 0.9 % bolus 1,000 mL  Once        Route: Intravenous  Ordered Dose: 1,000 mL     Last MAR action: Stopped Burnell Blanks, [facility]    05/22/21 2052 05/22/21 2051  ondansetron (ZOFRAN) injection 4 mg  Once        Route: Intravenous  Ordered Dose: 4 mg     Last MAR action: Given Macky Lower    05/22/21 2052 05/22/21 2051  HYDROmorphone (DILAUDID) injection 0.5 mg  Once        Route: Intravenous  Ordered Dose: 0.5 mg     Last MAR action: Given Macky Lower    05/22/21 2050 05/22/21 2050  iohexol (OMNIPAQUE) 350 MG/ML injection 100 mL  IMG once as needed        Route: Intravenous  Ordered Dose: 100 mL     Last MAR action: Imaging Agent Given Macky Lower    05/22/21 1925 05/22/21 1924  sodium chloride 0.9 % bolus 1,000 mL  Once        Route: Intravenous  Ordered Dose: 1,000 mL     Last MAR action: Jacklynn Barnacle, Denni [place]    05/22/21  1925 05/22/21 1924  piperacillin-tazobactam (ZOSYN) injection 4.5 g  Once        Route: Intravenous  Ordered Dose: 4.5 g     Last MAR action: Given Ninnie Fein    Signed and Held Signed and Held    Daily        Route: Oral  Ordered Dose: 1 mg      NGUYEN, BAO-NGOC H    Signed and Held Signed and Held    Daily        Route: Oral  Ordered Dose: 81 mg      NGUYEN, BAO-NGOC H    Signed and Held Signed and Held    Daily        Route: Oral  Ordered Dose: 10 mg      NGUYEN, BAO-NGOC H    Signed and Held Signed and Held    2 times daily PRN        Route: Oral  Ordered Dose: 100 mg      NGUYEN, BAO-NGOC H    Signed and Held Signed and Held    Daily        Route: Oral  Ordered Dose: 400 mg      NGUYEN, BAO-NGOC H    Signed and Held Signed and Held    Every  morning before breakfast        Route: Oral  Ordered Dose: 40 mg      NGUYEN, BAO-NGOC H            LABORATORY RESULTS    Ordered and independently interpreted AVAILABLE laboratory tests. Please see results section in chart for full details.  Results for orders placed or performed during the hospital encounter of 05/22/21   COVID-19 (SARS-COV-2) ([facility])    Specimen: Nasopharynx; Nasopharyngeal Swab   Result Value Ref Range    Purpose of COVID testing Screening     SARS-CoV-2 Specimen Source Nasopharyngeal     SARS CoV-2 Negative    CBC and differential   Result Value Ref Range    WBC 14.81 (H) 3.10 - 9.50 x10 3/uL    Hgb 10.6 (L) 11.4 - 14.8 g/dL    Hematocrit 16.1 (L) 34.7 - 43.7 %    Platelets 324 142 - 346 x10 3/uL    RBC 3.87 (L) 3.90 - 5.10 x10 6/uL    MCV 86.0 78.0 - 96.0 fL    MCH 27.4 25.1 - 33.5 pg    MCHC 31.8 31.5 - 35.8 g/dL    RDW 13 11 - 15 %    MPV 10.8 8.9 - 12.5 fL    Neutrophils 87.1 None %    Lymphocytes Automated 8.6 None %    Monocytes 3.7 None %    Eosinophils Automated 0.1 None %    Basophils Automated 0.2 None %    Immature Granulocytes 0.3 None %    Nucleated RBC 0.0 0.0 - 0.0 /100 WBC    Neutrophils Absolute 12.89 (H) 1.10 - 6.33 x10 3/uL     Lymphocytes Absolute Automated 1.28 0.42 - 3.22 x10 3/uL    Monocytes Absolute Automated 0.55 0.21 - 0.85 x10 3/uL    Eosinophils Absolute Automated 0.01 0.00 - 0.44 x10 3/uL    Basophils Absolute Automated 0.03 0.00 - 0.08 x10 3/uL    Immature Granulocytes Absolute 0.05 0.00 - 0.07 x10 3/uL    Absolute NRBC 0.00 0.00 - 0.00 x10 3/uL   Comprehensive metabolic panel   Result Value Ref Range    Glucose 152 (H) 70 - 100 mg/dL    BUN 09.6 7.0 - 04.5 mg/dL    Creatinine 1.4 (H) 0.6 - 1.0 mg/dL    Sodium 409 811 - 914 mEq/L    Potassium 4.3 3.5 - 5.1 mEq/L    Chloride 103 100 - 111 mEq/L    CO2 26 22 - 29 mEq/L    Calcium 9.5 7.9 - 10.2 mg/dL    Protein, Total 6.8 6.0 - 8.3 g/dL    Albumin 4.1 3.5 - 5.0 g/dL    AST (SGOT) 23 5 - 34 U/L    ALT 11 0 - 55 U/L    Alkaline Phosphatase 80 37 - 117 U/L    Bilirubin, Total 0.3 0.2 - 1.2 mg/dL    Globulin 2.7 2.0 - 3.6 g/dL    Albumin/Globulin Ratio 1.5 0.9 - 2.2    Anion Gap 11.0 5.0 - 15.0   Lipase   Result Value Ref Range    Lipase 16 8 - 78 U/L   Urinalysis Reflex to Microscopic Exam- Reflex to Culture   Result Value Ref Range    Urine Type Urine, Clean Ca     Color, UA Yellow Colorless - Yellow    Clarity, UA Clear Clear - Hazy    Specific Gravity UA 1.010  1.001 - 1.035    Urine pH 6.0 5.0 - 8.0    Leukocyte Esterase, UA Trace (A) Negative    Nitrite, UA Negative Negative    Protein, UR Negative Negative    Glucose, UA Negative Negative    Ketones UA 5 (A) Negative    Urobilinogen, UA Negative 0.2 - 2.0 mg/dL    Bilirubin, UA Negative Negative    Blood, UA Negative Negative    RBC, UA 0 - 2 0 - 5 /hpf    WBC, UA 0 - 5 0 - 5 /hpf    Hyaline Casts, UA 0 - 3 0 - 5 /lpf    Urine Mucus Present None   GFR   Result Value Ref Range    EGFR 44.2    Lactic Acid   Result Value Ref Range    Lactic Acid 1.2 0.2 - 2.0 mmol/L   Troponin I   Result Value Ref Range    Troponin I <0.01 0.00 - 0.05 ng/mL   Prothrombin time/INR   Result Value Ref Range    PT 12.1 10.1 - 12.9 sec    PT INR 1.1  0.9 - 1.1   Lactic Acid   Result Value Ref Range    Lactic Acid 2.5 (H) 0.2 - 2.0 mmol/L   CBC and differential   Result Value Ref Range    WBC 20.59 (H) 3.10 - 9.50 x10 3/uL    Hgb 10.9 (L) 11.4 - 14.8 g/dL    Hematocrit 16.1 09.6 - 43.7 %    Platelets 335 142 - 346 x10 3/uL    RBC 4.14 3.90 - 5.10 x10 6/uL    MCV 88.6 78.0 - 96.0 fL    MCH 26.3 25.1 - 33.5 pg    MCHC 29.7 (L) 31.5 - 35.8 g/dL    RDW 13 11 - 15 %    MPV 12.0 8.9 - 12.5 fL    Neutrophils 93.4 None %    Lymphocytes Automated 2.7 None %    Monocytes 3.4 None %    Eosinophils Automated 0.0 None %    Basophils Automated 0.1 None %    Immature Granulocytes 0.4 None %    Nucleated RBC 0.0 0.0 - 0.0 /100 WBC    Neutrophils Absolute 19.23 (H) 1.10 - 6.33 x10 3/uL    Lymphocytes Absolute Automated 0.55 0.42 - 3.22 x10 3/uL    Monocytes Absolute Automated 0.71 0.21 - 0.85 x10 3/uL    Eosinophils Absolute Automated 0.00 0.00 - 0.44 x10 3/uL    Basophils Absolute Automated 0.02 0.00 - 0.08 x10 3/uL    Immature Granulocytes Absolute 0.08 (H) 0.00 - 0.07 x10 3/uL    Absolute NRBC 0.00 0.00 - 0.00 x10 3/uL   Basic Metabolic Panel   Result Value Ref Range    Glucose 188 (H) 70 - 100 mg/dL    BUN 04.5 7.0 - 40.9 mg/dL    Creatinine 1.0 0.6 - 1.0 mg/dL    Calcium 8.9 7.9 - 81.1 mg/dL    Sodium 914 782 - 956 mEq/L    Potassium 4.6 3.5 - 5.1 mEq/L    Chloride 107 100 - 111 mEq/L    CO2 23 22 - 29 mEq/L    Anion Gap 12.0 5.0 - 15.0   GFR   Result Value Ref Range    EGFR >60.0    Lactic Acid   Result Value Ref Range    Lactic Acid 1.9 0.2 - 2.0 mmol/L  CBC and differential   Result Value Ref Range    WBC 17.92 (H) 3.10 - 9.50 x10 3/uL    Hgb 9.6 (L) 11.4 - 14.8 g/dL    Hematocrit 16.1 (L) 34.7 - 43.7 %    Platelets 276 142 - 346 x10 3/uL    RBC 3.60 (L) 3.90 - 5.10 x10 6/uL    MCV 89.2 78.0 - 96.0 fL    MCH 26.7 25.1 - 33.5 pg    MCHC 29.9 (L) 31.5 - 35.8 g/dL    RDW 13 11 - 15 %    MPV 11.9 8.9 - 12.5 fL    Neutrophils 88.1 None %    Lymphocytes Automated 6.1 None %     Monocytes 5.1 None %    Eosinophils Automated 0.0 None %    Basophils Automated 0.1 None %    Immature Granulocytes 0.6 None %    Nucleated RBC 0.0 0.0 - 0.0 /100 WBC    Neutrophils Absolute 15.79 (H) 1.10 - 6.33 x10 3/uL    Lymphocytes Absolute Automated 1.09 0.42 - 3.22 x10 3/uL    Monocytes Absolute Automated 0.91 (H) 0.21 - 0.85 x10 3/uL    Eosinophils Absolute Automated 0.00 0.00 - 0.44 x10 3/uL    Basophils Absolute Automated 0.02 0.00 - 0.08 x10 3/uL    Immature Granulocytes Absolute 0.11 (H) 0.00 - 0.07 x10 3/uL    Absolute NRBC 0.00 0.00 - 0.00 x10 3/uL   Basic Metabolic Panel   Result Value Ref Range    Glucose 152 (H) 70 - 100 mg/dL    BUN 7.0 7.0 - 09.6 mg/dL    Creatinine 0.9 0.6 - 1.0 mg/dL    Calcium 8.4 7.9 - 04.5 mg/dL    Sodium 409 811 - 914 mEq/L    Potassium 3.9 3.5 - 5.1 mEq/L    Chloride 106 100 - 111 mEq/L    CO2 26 22 - 29 mEq/L    Anion Gap 9.0 5.0 - 15.0   GFR   Result Value Ref Range    EGFR >60.0    Lactic Acid   Result Value Ref Range    Lactic Acid 1.0 0.2 - 2.0 mmol/L   CBC and differential   Result Value Ref Range    WBC 19.04 (H) 3.10 - 9.50 x10 3/uL    Hgb 9.4 (L) 11.4 - 14.8 g/dL    Hematocrit 78.2 (L) 34.7 - 43.7 %    Platelets 266 142 - 346 x10 3/uL    RBC 3.50 (L) 3.90 - 5.10 x10 6/uL    MCV 89.1 78.0 - 96.0 fL    MCH 26.9 25.1 - 33.5 pg    MCHC 30.1 (L) 31.5 - 35.8 g/dL    RDW 13 11 - 15 %    MPV 11.7 8.9 - 12.5 fL    Neutrophils 83.9 None %    Lymphocytes Automated 8.7 None %    Monocytes 6.7 None %    Eosinophils Automated 0.0 None %    Basophils Automated 0.1 None %    Immature Granulocytes 0.6 None %    Nucleated RBC 0.0 0.0 - 0.0 /100 WBC    Neutrophils Absolute 15.96 (H) 1.10 - 6.33 x10 3/uL    Lymphocytes Absolute Automated 1.66 0.42 - 3.22 x10 3/uL    Monocytes Absolute Automated 1.28 (H) 0.21 - 0.85 x10 3/uL    Eosinophils Absolute Automated 0.00 0.00 - 0.44 x10 3/uL    Basophils Absolute Automated 0.02  0.00 - 0.08 x10 3/uL    Immature Granulocytes Absolute 0.12 (H)  0.00 - 0.07 x10 3/uL    Absolute NRBC 0.00 0.00 - 0.00 x10 3/uL   Basic Metabolic Panel   Result Value Ref Range    Glucose 100 70 - 100 mg/dL    BUN 7.0 7.0 - 16.1 mg/dL    Creatinine 0.8 0.6 - 1.0 mg/dL    Calcium 8.1 7.9 - 09.6 mg/dL    Sodium 045 409 - 811 mEq/L    Potassium 3.7 3.5 - 5.1 mEq/L    Chloride 105 100 - 111 mEq/L    CO2 25 22 - 29 mEq/L    Anion Gap 11.0 5.0 - 15.0   GFR   Result Value Ref Range    EGFR >60.0    CBC and differential   Result Value Ref Range    WBC 13.73 (H) 3.10 - 9.50 x10 3/uL    Hgb 8.9 (L) 11.4 - 14.8 g/dL    Hematocrit 91.4 (L) 34.7 - 43.7 %    Platelets 274 142 - 346 x10 3/uL    RBC 3.31 (L) 3.90 - 5.10 x10 6/uL    MCV 88.8 78.0 - 96.0 fL    MCH 26.9 25.1 - 33.5 pg    MCHC 30.3 (L) 31.5 - 35.8 g/dL    RDW 13 11 - 15 %    MPV 12.0 8.9 - 12.5 fL    Neutrophils 79.4 None %    Lymphocytes Automated 10.3 None %    Monocytes 8.1 None %    Eosinophils Automated 1.3 None %    Basophils Automated 0.3 None %    Immature Granulocytes 0.6 None %    Nucleated RBC 0.0 0.0 - 0.0 /100 WBC    Neutrophils Absolute 10.90 (H) 1.10 - 6.33 x10 3/uL    Lymphocytes Absolute Automated 1.42 0.42 - 3.22 x10 3/uL    Monocytes Absolute Automated 1.11 (H) 0.21 - 0.85 x10 3/uL    Eosinophils Absolute Automated 0.18 0.00 - 0.44 x10 3/uL    Basophils Absolute Automated 0.04 0.00 - 0.08 x10 3/uL    Immature Granulocytes Absolute 0.08 (H) 0.00 - 0.07 x10 3/uL    Absolute NRBC 0.00 0.00 - 0.00 x10 3/uL   Basic Metabolic Panel   Result Value Ref Range    Glucose 107 (H) 70 - 100 mg/dL    BUN 7.0 7.0 - 78.2 mg/dL    Creatinine 0.8 0.6 - 1.0 mg/dL    Calcium 8.2 7.9 - 95.6 mg/dL    Sodium 213 086 - 578 mEq/L    Potassium 3.3 (L) 3.5 - 5.1 mEq/L    Chloride 108 100 - 111 mEq/L    CO2 29 22 - 29 mEq/L    Anion Gap 5.0 5.0 - 15.0   GFR   Result Value Ref Range    EGFR >60.0    Magnesium   Result Value Ref Range    Magnesium 1.9 1.6 - 2.6 mg/dL   Renal function panel   Result Value Ref Range    Glucose 98 70 - 100 mg/dL     Sodium 469 629 - 528 mEq/L    Potassium 3.3 (L) 3.5 - 5.1 mEq/L    Chloride 107 100 - 111 mEq/L    CO2 29 22 - 29 mEq/L    BUN 5.0 (L) 7.0 - 19.0 mg/dL    Calcium 8.4 7.9 - 41.3 mg/dL    Creatinine 0.7 0.6 - 1.0  mg/dL    Albumin 2.5 (L) 3.5 - 5.0 g/dL    Phosphorus 2.1 (L) 2.3 - 4.7 mg/dL    Anion Gap 8.0 5.0 - 15.0   GFR   Result Value Ref Range    EGFR >60.0    ECG 12 lead   Result Value Ref Range    Ventricular Rate 71 BPM    Atrial Rate 71 BPM    P-R Interval 200 ms    QRS Duration 92 ms    Q-T Interval 390 ms    QTC Calculation (Bezet) 423 ms    P Axis 54 degrees    R Axis 42 degrees    T Axis 46 degrees    [facility] MUSE NARRATIVE AND IMPRESSION       NORMAL SINUS RHYTHM  NONSPECIFIC T WAVE ABNORMALITY  Confirmed by MORDA MD, JASON ([id number]) on 05/25/2021 1:27:06 PM     Type and Screen   Result Value Ref Range    ABO Rh O POS     AB Screen Gel NEG    ABO/Rh   Result Value Ref Range    ABO Rh O POS        PROCEDURES    Procedures        MEDICAL DECISION MAKING     DISCUSSION    Patient's condition remained stable during Emergency Department evaluation. The decision was made to obtain prior medical records. Orders Written.      CT abdomen ordered to rule out appendicitis.     Patient initially treated with patient treated with fluids Zofran and and Dilaudid for pain and nausea.     Upon evaluation patient is resting comfortably and slightly improvement of pain.  Patient also had a blood pressure of 81/45 which was improved to 136/61 after getting 1 L of fluid.    Given patient's CAT scan finding Case was discussed with surgical specialist on-call.  She recommends admission to medicine and prepping patient for intermediate exploratory surgery.    Patient was treated with Zosyn for intra-abdominal pathology in the setting of ischemic bowel.  Patient was also given 30 mL/kg of fluid for appropriate fluid resuscitation.    Patient's lactate was normal and not suspecting sepsis at this time.     Based on the patient's  stable condition, decision made with patient for admission. Plan for OR discussed with the patient, and all questions and concerns addressed accordingly. Patient states a clear understanding of the plan provided and is comfortable and agreeable.    Each of the differential diagnosis has been considered for diagnosis and weighed risk and benefit of further testing and evaluation in the context of patient complaint. Some diagnosis do not warrant further testing including imaging and/or labs. However, all differential diagnosis have been considered.       All labs and vital signs from the current visit have been reviewed and any abnormality that is present is not due to severe sepsis or septic shock.    Vital Signs: Reviewed the patient's vital signs.   Nursing Notes: Reviewed and utilized available nursing notes.  Medical Records Reviewed: Reviewed available past medical records.  Counseling: The emergency provider has spoken with the patient and discussed today's findings, in addition to providing specific details for the plan of care.  Questions are answered and there is agreement with the plan.      Prescriptions:  Current Discharge Medication List        CONTINUE these medications which have NOT  CHANGED    Details   amLODIPine (NORVASC) 10 MG tablet Take 1 tablet (10 mg total) by mouth daily  Qty: 90 tablet, Refills: 1    Associated Diagnoses: Essential hypertension      anastrozole (ARIMIDEX) 1 MG tablet Take 1 tablet (1 mg total) by mouth daily  Qty: 90 tablet, Refills: 3    Associated Diagnoses: Malignant neoplasm of overlapping sites of right breast in female, estrogen receptor positive      aspirin 81 MG chewable tablet Chew 1 tablet (81 mg total) by mouth daily  Qty: 90 tablet, Refills: 0      ezetimibe (ZETIA) 10 MG tablet Take 1 tablet (10 mg total) by mouth daily  Qty: 90 tablet, Refills: 3    Associated Diagnoses: Pure hypercholesterolemia      gabapentin (NEURONTIN) 100 MG capsule Take 1 capsule (100 mg  total) by mouth 2 (two) times daily as needed (Neuropathy)  Qty: 20 capsule, Refills: 0      guaiFENesin (MUCINEX) 600 MG 12 hr tablet Take 2 tablets (1,200 mg total) by mouth 2 (two) times daily  Qty: 14 tablet, Refills: 0      lactobacillus/streptococcus (RISAQUAD) Cap Take 1 capsule by mouth daily  Qty: 10 capsule, Refills: 0      levothyroxine (SYNTHROID) 25 MCG tablet Take 1 tablet (25 mcg total) by mouth every morning  Qty: 30 tablet, Refills: 0      lisinopril (ZESTRIL) 40 MG tablet Take 1 tablet (40 mg total) by mouth daily  Qty: 90 tablet, Refills: 1    Associated Diagnoses: Essential hypertension      magnesium oxide (MAG-OX) 400 MG tablet Take 1 tablet (400 mg total) by mouth daily  Qty: 30 tablet, Refills: 0      meclizine (ANTIVERT) 25 MG tablet Take 1 tablet (25 mg total) by mouth 3 (three) times daily as needed for Dizziness  Qty: 10 tablet, Refills: 0      pantoprazole (PROTONIX) 40 MG tablet Take 1 tablet (40 mg total) by mouth every morning before breakfast  Qty: 30 tablet, Refills: 0      rosuvastatin (CRESTOR) 40 MG tablet Take 0.5 tablets (20 mg total) by mouth daily  Qty: 90 tablet, Refills: 3    Associated Diagnoses: Essential hypertension; Mixed hyperlipidemia      Apoaequorin (PREVAGEN PO) Take by mouth daily      desonide (DESOWEN) 0.05 % ointment Apply topically 2 (two) times daily  Qty: 15 g, Refills: 0      diclofenac Sodium (VOLTAREN) 1 % Gel topical gel Apply 2 g topically 4 (four) times daily  Qty: 100 g, Refills: 0      Ferrous Sulfate (IRON PO) Take by mouth      lidocaine (LIDODERM) 5 % Place 1 patch onto the skin every 24 hours Remove & Discard patch within 12 hours or as directed by MD  Qty: 30 patch, Refills: 0      Multiple Vitamins-Minerals (MULTIVITAMIN WITH MINERALS) tablet Take 1 tablet by mouth every morning          vitamin B-1 (THIAMINE) 100 MG tablet Take 100 mg by mouth every morning.                   DIAGNOSIS      Diagnosis:  Final diagnoses:   Small bowel ischemia    Right lower quadrant abdominal pain       Disposition:  ED Disposition  ED Disposition   Admit    Condition   --    Date/Time   Sat May 22, 2021  9:30 PM    Comment   Admitting Physician: Gaynelle Adu [[id number]]   Service:: Medicine [[id number]]   Estimated Length of Stay: > or = to 2 midnights   Tentative Discharge Plan?: Home or Self Care [1]   Does patient need telemetry?: Yes   Telemetry type (separate Telemetry order is also required):: Medical telemetry                       This note was generated by the Epic EMR system/Dragon speech recognition and may contain inherent errors or omissions not intended by the user. Grammatical errors, random word insertions, deletions, pronoun errors and incomplete sentences are occasional consequences of this technology due to software limitations. Not all errors are caught or corrected. If there are questions or concerns about the content of this note or information contained within the body of this dictation they should be addressed directly with the author for clarification.     Macky Lower, MD  05/27/21 1355

## 2021-05-22 NOTE — H&P (Signed)
Cc:  abdominal pain, nausea vomiting and diarrhea    HPI:  75 yo female with hlp, htn presented to the ED because of abdominal pain, nausea and vomiting.  The pain was sudden onset, right lower quadrant.  Patient had appendectomy history.  She denied fever.  No recent sick contact      Allergies:     Allergies   Allergen Reactions    Morphine Itching    Morphine And Related Itching and Nausea And Vomiting     dizzy       Medication:   (Not in a hospital admission)      Past Medical History:     Past Medical History:   Diagnosis Date    Breast cancer 2014    right breast mastectomy    Exercise-induced angina     negative work up    GERD (gastroesophageal reflux disease)     Headache     resolved    Hyperlipidemia     Hypertension     Hypothyroidism     Low back pain     Malignant neoplasm of overlapping sites of right female breast 10/19/2015    Syncope and collapse     Chronic Vertigo    Vertigo        Past Surgical History:     Past Surgical History:   Procedure Laterality Date    COLONOSCOPY  2021    COLONOSCOPY, POLYPECTOMY N/A 07/21/2020    Procedure: COLONOSCOPY, POLYPECTOMY;  Surgeon: Einar Grad, MD;  Location: [facility];  Service: Gastroenterology;  Laterality: N/A;    EGD, BIOPSY N/A 07/21/2020    Procedure: EGD, BIOPSY;  Surgeon: Einar Grad, MD;  Location: [facility];  Service: Gastroenterology;  Laterality: N/A;    HYSTERECTOMY      MASTECTOMY Right 2015       Family History:     Family History   Problem Relation Age of Onset    Breast cancer Neg Hx        Social History:     Social History     Socioeconomic History    Marital status: Widowed     Spouse name: Not on file    Number of children: Not on file    Years of education: Not on file    Highest education level: Not on file   Occupational History    Not on file   Tobacco Use    Smoking status: Never    Smokeless tobacco: Never   Vaping Use    Vaping Use: Never used   Substance and Sexual Activity    Alcohol use: Not Currently     Comment: occ     Drug use: No    Sexual activity: Not Currently   Other Topics Concern    Not on file   Social History Narrative    Not on file     Social Determinants of Health     Financial Resource Strain: Not on file   Food Insecurity: Not on file   Transportation Needs: Not on file   Physical Activity: Not on file   Stress: Not on file   Social Connections: Not on file   Intimate Partner Violence: Not on file   Housing Stability: Not on file       Review of Systems:   Review of Systems   Constitutional: Negative.    HENT: Negative.     Eyes: Negative.    Respiratory: Negative.  Cardiovascular: Negative.    Gastrointestinal:  Positive for abdominal pain, diarrhea, nausea and vomiting.   Genitourinary: Negative.    Musculoskeletal: Negative.    Skin: Negative.    Neurological: Negative.    Endo/Heme/Allergies: Negative.    Psychiatric/Behavioral: Negative.       Physical Exam:   Physical Exam  Constitutional:       Appearance: Normal appearance.   HENT:      Head: Normocephalic and atraumatic.      Mouth/Throat:      Mouth: Mucous membranes are moist.   Eyes:      Extraocular Movements: Extraocular movements intact.      Pupils: Pupils are equal, round, and reactive to light.   Cardiovascular:      Rate and Rhythm: Normal rate and regular rhythm.      Pulses: Normal pulses.      Heart sounds: Normal heart sounds.   Pulmonary:      Effort: Pulmonary effort is normal.      Breath sounds: Normal breath sounds.   Abdominal:      General: Abdomen is flat. There is no distension.      Palpations: Abdomen is soft. There is no mass.      Tenderness: There is abdominal tenderness. There is guarding. There is no rebound.      Hernia: No hernia is present.   Musculoskeletal:         General: No swelling or tenderness.      Cervical back: Normal range of motion and neck supple.   Skin:     Coloration: Skin is not jaundiced or pale.   Neurological:      General: No focal deficit present.      Mental Status: She is alert and oriented to  person, place, and time.   Psychiatric:         Mood and Affect: Mood normal.         Behavior: Behavior normal.       Vitals:    05/22/21 2109   BP: 147/66   Pulse: 82   Resp: 16   Temp:    SpO2: 98%       Labs:     Recent Labs   Lab 05/22/21  1935   WBC 14.81*   RBC 3.87*   Hgb 10.6*   Hematocrit 33.3*   Platelets 324   Glucose 152*   BUN 18.0   Creatinine 1.4*   Calcium 9.5   Sodium 140   Potassium 4.3   Chloride 103   CO2 26       Rads:   Radiological Procedure reviewed.      CT ABDOMEN PELVIS W IV/ WO PO CONT    IMPRESSION:      1.  There is increased wall thickening of the cecum and ascending colon  with hypoenhancement. A new small focus of gas is seen in the mesenteric  draining vein with new tiny foci of gas in the left hepatic lobe that  are favored to reflect portal venous gas. Findings are concerning for  bowel ischemia.  2.  Faint areas of hypoenhancement in the kidneys bilaterally can be  seen with pyelonephritis, recommend correlation with urinalysis.  3.  Subcentimeter pulmonary nodules measuring up to 3 mm. Follow-up  should be obtained based on Fleischner guidelines.  4.  The appendix is not visualized.  5.  Ancillary findings as described above.     Findings were communicated to Dr. Delphina Cahill at 05/22/2021  9:00 PM.     A/P:  Patient with abdominal pain, CT scan showed colitis of cecum and ascending colon.  She has leukocytosis 14,000 but lactate is currently 1.2.  Will admit for serial exam and serial lactate, IVF and Zosyn.  Will keep NPO.  General surgery has been consulted by ED doc.

## 2021-05-22 NOTE — Discharge Instructions (Addendum)
Low-Fiber Diet     Eggs are high in protein and easy to digest.     Eating a low-fiber diet means eating foods that don't have much fiber. These foods are easy to digest.   Most of the fiber that you eat passes undigested through your bowel. This is what forms stool. Low-fiber foods can help to slow down your bowel movements. When you eat a low-fiber diet, you have fewer stools. This lets your intestine rest.   Your healthcare provider will tell you how long you need to be on this diet. It may only be for a short time. Low-fiber foods often don't give you all the nutrients you need to stay healthy. Your healthcare provider may have you take certain vitamins while you are on this diet.   Reasons to eat a low-fiber diet  The goal of a low-fiber diet is to limit the size and number of your stools. It may be prescribed if you:   Are going through chemotherapy or radiation treatments  Have had intestinal surgery  Have trouble digesting food  Have a condition that affects your intestine, such as diarrhea, irritable bowel syndrome, Crohn's disease, ulcerative colitis, or diverticulitis  General guidelines for a low-fiber diet  In general, a low-fiber diet means having fewer than 13 grams of fiber a day. Your healthcare provider may give you a list of things you can and can't eat or drink. Read food labels. Choose foods and drinks that have as close to zero grams of fiber as possible. Here are general guidelines to follow:   Breads, pasta, cereal, rice, and other starches (6 to 11 servings daily)  What to choose: white bread, biscuits, muffins, and white rolls; plain crackers; waffles; white pasta; white rice; cream of wheat; grits; white pancakes; corn flakes; cooked potatoes without skin; pretzels. Fiber content of these foods should be less than 0.5 (1/2) gram per serving.  What to pass up: whole-wheat or whole-grain breads, crackers, and pasta; breads with seeds or nuts; wheat germ; graham crackers; cornbread; wild or  brown rice; cereals with whole-grain, bran, and granola; cereals with seeds, nuts, coconut, or dried fruit; potatoes with skin  Milk and dairy (2 servings daily)  What to choose: milk, buttermilk; yogurt or ice cream without seeds or nuts; custard or pudding; sour cream; cheese and cottage cheese; cream sauces, soups, and casseroles  What to pass up: ice cream and yogurt with seeds, nuts, or fruit chunks  Fruit (2 to 4 servings daily)  What to choose: ripe banana; ripe nectarine, peach, apricot, papaya, and plum; soft honeydew melon and cantaloupe; cooked or canned fruit without skin or seeds (not sweetened with sorbitol); applesauce; strained fruit juice (without pulp)  What to pass up: raw or dried fruit; all berries; raisins; canned and raw pineapple; prunes and prune juice; fruit juice with pulp  Vegetables (3 to 5 servings daily)  What to choose: well-cooked or canned vegetables without seeds, such as spinach, eggplant, green and wax beans, carrots, yellow squash, pumpkin; lettuce on a sandwich  What to pass up: all raw or steamed vegetables; vegetables with seeds, such as unstrained tomato sauce; green peas; lima beans; broccoli; corn; parsnips  Meats and protein (4 to 6 ounces daily)  What to choose: tender, well-cooked meat, including ground meat, poultry, and fish; eggs; tofu; creamy peanut butter  What to pass up: processed meats such as hot dogs and sausages, tough, chewy meat with gristle; peas, including split, yellow, and black-eyed; beans, including   navy, lima, black, garbanzo, soy, pinto, and lentil; peanuts and crunchy peanut butter   Fats, oils, sauces, condiments (fewer than 8 teaspoons daily)  What to choose: butter, margarine, oils, whipped cream, sour cream, mayonnaise, smooth dressings and sauces; plain gravy; smooth condiments  What to pass up: dressing with seeds or fruit chunks; pickles and relishes  Other foods and drinks  What to choose: water; plain gelatin; plain puddings; pretzels;  plain cookies and cakes; honey, syrup; decaffeinated drinks, including tea and coffee    What to pass up: popcorn; potato chips; spicy foods; fried, greasy foods; alcohol (ask your healthcare provider); marmalade, jam, and preserves; desserts that have seeds, nuts, coconut, dried fruit, whole grains, or bran; candy that has seeds or nuts; drinks sweetened with sorbitol or other sugar substitutes; caffeinated drinks, including tea, coffee, soda, and energy drinks    [employer] last reviewed this educational content on 02/01/2019   2000-2022 [employer]. All rights reserved. This information is not intended as a substitute for professional medical care. Always follow your healthcare professional's instructions.

## 2021-05-22 NOTE — ED Triage Notes (Signed)
At 2pm patient abdomen pain with dizziness associated with nausea and vomiting  Patient states has CP

## 2021-05-22 NOTE — Consults (Signed)
CONSULTATION    [facility]    Date Time: 05/22/21 11:15 PM  Attending Physician: Gaynelle Adu, MD  Consulting Physician:Owain Eckerman Ned Grace, MD, FACS  Consulting Service: [facility]    Reason For Consultation:  Right sided colitis with possible portal venous gas and possible mesenteric vein gas.     History of Presenting Illness:  Emily [name] is a 75 y.o. female with sudden onset of abdominal pain right side yesterday that waxed and waned and then got severely worse today bringing her to ED. She had a colonoscopy 07/2020 with only sigmoid polyp and evidence of the prior colitis seen 03/2020 on ct scan.  Ct scan today in comparison is severe colitis on right side extending to the transverse colon. H/o of similar right sided colitis though less severe on review of ct scans 03/2020-- follow up scope was normal.    Past Medical History:   Diagnosis Date    Breast cancer 2014    right breast mastectomy    Exercise-induced angina     negative work up    GERD (gastroesophageal reflux disease)     Headache     resolved    Hyperlipidemia     Hypertension     Hypothyroidism     Low back pain     Malignant neoplasm of overlapping sites of right female breast 10/19/2015    Syncope and collapse     Chronic Vertigo    Vertigo        Past Surgical History:   Procedure Laterality Date    COLONOSCOPY  2021    COLONOSCOPY, POLYPECTOMY N/A 07/21/2020    Procedure: COLONOSCOPY, POLYPECTOMY;  Surgeon: Einar Grad, MD;  Location: [facility];  Service: Gastroenterology;  Laterality: N/A;    EGD, BIOPSY N/A 07/21/2020    Procedure: EGD, BIOPSY;  Surgeon: Einar Grad, MD;  Location: [facility];  Service: Gastroenterology;  Laterality: N/A;    HYSTERECTOMY      MASTECTOMY Right 2015       Family History   Problem Relation Age of Onset    Breast cancer Neg Hx        Social History     Socioeconomic History    Marital status: Widowed     Spouse name: Not on file    Number of children: Not on file    Years  of education: Not on file    Highest education level: Not on file   Occupational History    Not on file   Tobacco Use    Smoking status: Never    Smokeless tobacco: Never   Vaping Use    Vaping Use: Never used   Substance and Sexual Activity    Alcohol use: Not Currently     Comment: occ    Drug use: No    Sexual activity: Not Currently   Other Topics Concern    Not on file   Social History Narrative    Not on file     Social Determinants of Health     Financial Resource Strain: Not on file   Food Insecurity: Not on file   Transportation Needs: Not on file   Physical Activity: Not on file   Stress: Not on file   Social Connections: Not on file   Intimate Partner Violence: Not on file   Housing Stability: Not on file       Allergies   Allergen Reactions    Morphine Itching  Morphine And Related Itching and Nausea And Vomiting     dizzy       Medications Prior to Admission   Medication Sig    amLODIPine (NORVASC) 10 MG tablet Take 1 tablet (10 mg total) by mouth daily    anastrozole (ARIMIDEX) 1 MG tablet Take 1 tablet (1 mg total) by mouth daily (Patient taking differently: Take 1 mg by mouth every morning)    Apoaequorin (PREVAGEN PO) Take by mouth daily    aspirin 81 MG chewable tablet Chew 1 tablet (81 mg total) by mouth daily    desonide (DESOWEN) 0.05 % ointment Apply topically 2 (two) times daily    diclofenac Sodium (VOLTAREN) 1 % Gel topical gel Apply 2 g topically 4 (four) times daily    ezetimibe (ZETIA) 10 MG tablet Take 1 tablet (10 mg total) by mouth daily    Ferrous Sulfate (IRON PO) Take by mouth    gabapentin (NEURONTIN) 100 MG capsule Take 1 capsule (100 mg total) by mouth 2 (two) times daily as needed (Neuropathy)    guaiFENesin (MUCINEX) 600 MG 12 hr tablet Take 2 tablets (1,200 mg total) by mouth 2 (two) times daily    lactobacillus/streptococcus (RISAQUAD) Cap Take 1 capsule by mouth daily    levothyroxine (SYNTHROID) 25 MCG tablet Take 1 tablet (25 mcg total) by mouth every morning     lidocaine (LIDODERM) 5 % Place 1 patch onto the skin every 24 hours Remove & Discard patch within 12 hours or as directed by MD    lisinopril (ZESTRIL) 40 MG tablet Take 1 tablet (40 mg total) by mouth daily    magnesium oxide (MAG-OX) 400 MG tablet Take 1 tablet (400 mg total) by mouth daily    meclizine (ANTIVERT) 25 MG tablet Take 1 tablet (25 mg total) by mouth 3 (three) times daily as needed for Dizziness    Multiple Vitamins-Minerals (MULTIVITAMIN WITH MINERALS) tablet Take 1 tablet by mouth every morning        pantoprazole (PROTONIX) 40 MG tablet Take 1 tablet (40 mg total) by mouth every morning before breakfast    rosuvastatin (CRESTOR) 40 MG tablet Take 0.5 tablets (20 mg total) by mouth daily    vitamin B-1 (THIAMINE) 100 MG tablet Take 100 mg by mouth every morning.       Review of Systems:  A comprehensive ten review of systems was: History obtained from chart review and the patient  General ROS: positive for  - fatigue  Psychological ROS: negative  Ophthalmic ROS: negative  ENT ROS: negative for - epistaxis, headaches, nasal congestion, nasal discharge, or sore throat  Allergy and Immunology ROS: negative  Hematological and Lymphatic ROS: negative for - bleeding problems, blood clots, blood transfusions, or bruising  Respiratory ROS: no cough, shortness of breath, or wheezing  Cardiovascular ROS: no chest pain or dyspnea on exertion  Gastrointestinal ROS: positive for - abdominal pain and nausea/vomiting  Genito-Urinary ROS: no dysuria, trouble voiding, or hematuria  Musculoskeletal ROS: negative  Neurological ROS: no TIA or stroke symptoms  Dermatological ROS: negative    Physical Exam:  Vitals:    05/22/21 2243   BP: 124/70   Pulse: 81   Resp: 18   Temp:    SpO2: 96%     General appearance - oriented to person, place, and time, normal appearing weight, in mild to moderate distress, and ill-appearing  Mental status - alert, oriented to person, place, and time, normal mood, behavior, speech, dress,  motor  activity, and thought processes  Eyes - pupils equal and reactive, extraocular eye movements intact, sclera anicteric  Ears - bilateral TM's and external ear canals normal, hearing grossly normal bilaterally  Nose - normal and patent, no erythema, discharge or polyps  Mouth - mucous membranes moist, pharynx normal without lesions  Neck - supple, no significant adenopathy  Chest - clear to auscultation, no wheezes, rales or rhonchi, symmetric air entry, no tachypnea, retractions or cyanosis  Heart - normal rate, regular rhythm, normal S1, S2, no murmurs, rubs, clicks or gallops  Abdomen - tenderness noted with rebound and guarding right side of abdomen  Back exam - full range of motion, no tenderness, palpable spasm or pain on motion  Neurological - alert, oriented, normal speech, no focal findings or movement disorder noted  Musculoskeletal - no joint tenderness, deformity or swelling  Extremities - peripheral pulses normal, no pedal edema, no clubbing or cyanosis  Skin - normal coloration and turgor, no rashes, no suspicious skin lesions noted    Results       Procedure Component Value Units Date/Time    ABO/Rh [[id number]] Collected: 05/22/21 1935     Updated: 05/22/21 2259     ABO Rh O POS    Type and Screen [[id number]] Collected: 05/22/21 2143    Specimen: Blood Updated: 05/22/21 2229     ABO Rh O POS     AB Screen Gel NEG    Prothrombin time/INR [[id number]] Collected: 05/22/21 2143    Specimen: Blood Updated: 05/22/21 2155     PT 12.1 sec      PT INR 1.1    COVID-19 (SARS-COV-2) ([place] [facility]) [[id number]] Collected: 05/22/21 2122    Specimen: Nasopharyngeal Swab from Nasopharynx Updated: 05/22/21 2142     Purpose of COVID testing Screening     SARS-CoV-2 Specimen Source Nasopharyngeal     SARS CoV-2 Negative    Narrative:      o Collect and clearly label specimen type:  o Upper respiratory specimen: One Nasopharyngeal Dry Swab NO  Transport Media.  o Hand deliver to laboratory ASAP  Indication for  testing->Extended care facility admission to  semi private room  Screening    Troponin I [[id number]] Collected: 05/22/21 1935    Specimen: Blood Updated: 05/22/21 2116     Troponin I <0.01 ng/mL     CBC and differential [[id number]]  (Abnormal) Collected: 05/22/21 1935    Specimen: Blood Updated: 05/22/21 2113     WBC 14.81 x10 3/uL      Hgb 10.6 g/dL      Hematocrit 40.3 %      Platelets 324 x10 3/uL      RBC 3.87 x10 6/uL      MCV 86.0 fL      MCH 27.4 pg      MCHC 31.8 g/dL      RDW 13 %      MPV 10.8 fL      Neutrophils 87.1 %      Lymphocytes Automated 8.6 %      Monocytes 3.7 %      Eosinophils Automated 0.1 %      Basophils Automated 0.2 %      Immature Granulocytes 0.3 %      Nucleated RBC 0.0 /100 WBC      Neutrophils Absolute 12.89 x10 3/uL      Lymphocytes Absolute Automated 1.28 x10 3/uL      Monocytes Absolute Automated 0.55 x10 3/uL  Eosinophils Absolute Automated 0.01 x10 3/uL      Basophils Absolute Automated 0.03 x10 3/uL      Immature Granulocytes Absolute 0.05 x10 3/uL      Absolute NRBC 0.00 x10 3/uL     Lactic Acid [[id number]] Collected: 05/22/21 1935    Specimen: Blood Updated: 05/22/21 2017     Lactic Acid 1.2 mmol/L     Narrative:      Cancel second order for specimen if the initial level is less  than 2.42mEq/L    Lipase [[id number]] Collected: 05/22/21 1935    Specimen: Blood Updated: 05/22/21 2013     Lipase 16 U/L     GFR [[id number]] Collected: 05/22/21 1935     Updated: 05/22/21 2013     EGFR 44.2    Comprehensive metabolic panel [[id number]]  (Abnormal) Collected: 05/22/21 1935    Specimen: Blood Updated: 05/22/21 2013     Glucose 152 mg/dL      BUN 62.9 mg/dL      Creatinine 1.4 mg/dL      Sodium 528 mEq/L      Potassium 4.3 mEq/L      Chloride 103 mEq/L      CO2 26 mEq/L      Calcium 9.5 mg/dL      Protein, Total 6.8 g/dL      Albumin 4.1 g/dL      AST (SGOT) 23 U/L      ALT 11 U/L      Alkaline Phosphatase 80 U/L      Bilirubin, Total 0.3 mg/dL      Globulin 2.7 g/dL       Albumin/Globulin Ratio 1.5     Anion Gap 11.0    Urinalysis Reflex to Microscopic Exam- Reflex to Culture [[id number]]  (Abnormal) Collected: 05/22/21 1935     Updated: 05/22/21 1959     Urine Type Urine, Clean Ca     Color, UA Yellow     Clarity, UA Clear     Specific Gravity UA 1.010     Urine pH 6.0     Leukocyte Esterase, UA Trace     Nitrite, UA Negative     Protein, UR Negative     Glucose, UA Negative     Ketones UA 5     Urobilinogen, UA Negative mg/dL      Bilirubin, UA Negative     Blood, UA Negative     RBC, UA 0 - 2 /hpf      WBC, UA 0 - 5 /hpf      Hyaline Casts, UA 0 - 3 /lpf      Urine Mucus Present    Culture Blood Aerobic and Anaerobic [[id number]] Collected: 05/22/21 1935    Specimen: Arm from Blood, Venipuncture Updated: 05/22/21 1936    Narrative:      1 BLUE+1 PURPLE    Lactic Acid [[id number]] Collected: 05/22/21 1935    Specimen: Blood Updated: 05/22/21 1936    Narrative:      No tourniquet;on ice            Radiology:  radiology: CT scan: CLINICAL INDICATION: Right lower quadrant tenderness     TECHNIQUE: Axial CT of the abdomen and pelvis was performed after  contrast. 100 mL Omnipaque 350 was administered intravenously. Coronal  and sagittal reconstructions were performed.     A combination of automatic exposure control, adjustment of the MA and/or  KV according to patient size and/or use of iterative reconstructive  technique was  utilized.     COMPARISON: 03/14/2020     FINDINGS:  Mediastinum: Borderline cardiomegaly  Lung bases: Mild right basilar atelectasis with small calcified  granuloma and subcentimeter pulmonary nodules measuring up to 3 mm.     Liver and biliary system: The subcentimeter focal hypodensity in the  liver is too small to characterize. Gallbladder is normal. No biliary  ductal dilation is noted. Tiny foci of gas are seen in the left hepatic  lobe.  Adrenal glands: Normal  Spleen: Normal  Pancreas: Normal  Kidneys, ureter and bladder: Small renal cysts and subcentimeter  focal  hypodensities are too small to characterize. There are faint areas of  hypoenhancement in the kidneys bilaterally. No hydronephrosis. No focal  wall thickening of the urinary bladder.  Genital: Uterus is surgically absent.     Stomach and bowel: There is increased wall thickening of the cecum and  ascending colon with hypoenhancement. There is a new small focus of gas  in the mesenteric draining vein. No evidence of bowel obstruction.  Peritoneum and peritoneal surface: No free air is identified.     Vasculature: Vascular calcifications are noted.   Retroperitoneum and lymph nodes: No pathologically enlarged lymph nodes  are identified     Soft tissue: Postsurgical changes are seen in the anterior abdominal  wall. Calcifications in the subcutaneous fat may reflect granuloma or  fat necrosis.  Osseous structures: There are multilevel degenerative changes in the  spine.     IMPRESSION:      1.  There is increased wall thickening of the cecum and ascending colon  with hypoenhancement. A new small focus of gas is seen in the mesenteric  draining vein with new tiny foci of gas in the left hepatic lobe that  are favored to reflect portal venous gas. Findings are concerning for  bowel ischemia.  2.  Faint areas of hypoenhancement in the kidneys bilaterally can be  seen with pyelonephritis, recommend correlation with urinalysis.  3.  Subcentimeter pulmonary nodules measuring up to 3 mm. Follow-up  should be obtained based on Fleischner guidelines.  4.  The appendix is not visualized.  5.  Ancillary findings as described above.     Findings were communicated to Dr. Delphina Cahill at 05/22/2021 9:00 PM.     Ewing Schlein MD, MD   05/22/2021 9:05 PM    Patient Active Problem List   Diagnosis    Malignant neoplasm of overlapping sites of right female breast    Hypertension    Mixed hyperlipidemia    Hypothyroid    Right sided numbness    Chest discomfort    SOB (shortness of breath)    Left sided numbness    Left-sided weakness     COVID-19 ruled out by laboratory testing    Acute colitis    Dizziness    Right lower quadrant abdominal pain    Abnormal CT scan, gastrointestinal tract    History of hypothyroidism    History of breast cancer    Altered mental status    Hypertensive emergency    Ischemic colon    History of colonic polyps       Assessment:  75 y.o. female with possible ischemic colon or severe colitis. Imaging is concerning for possible portal venous air, mesenteric vein air-- severe pain on exam. Wbc is 14.8, AKI with cr 1.4. lactate is normal  [birth year] birthday.    Plan:  Diagnostic laparoscopy, possible open, possible exlap and possible bowel resection,  possible ostomy. Possible open abdomen also discussed  The ACS Risk Calculator was utilized to estimate the patient's chance of post-operative complications due to existing co-morbidities.  The results were presented to the patient in the pre-op holding area and discussed in detail.  All questions were answered and having understood, the patient agreed to proceed.      Above average - emergency surgery, wbc 14. Possible ischemic bowel.        Delena Serve, MD , MD [facility]  [facility]  [address]  [address]  [place], [place] [address]    [phone]            Delena Serve, MD

## 2021-05-22 NOTE — EDIE (Signed)
COLLECTIVE?NOTIFICATION?05/22/2021 18:59?Emily, Sporer [name]?MRN: [id number]    [place] [facility]'s patient encounter information:   VWU:?[id number]  Account [id number]  Billing Account [id number]      Criteria Met      5 ED Visits in 12 Months    Security and Safety  No Security Events were found.  ED Care Guidelines  There are currently no ED Care Guidelines for this patient. Please check your facility's medical records system.        Prescription Monitoring Program  000??- Narcotic Use Score  000??- Sedative Use Score  000??- Stimulant Use Score  000??- Overdose Risk Score  - All Scores range from 000-999 with 75% of the population scoring < 200 and on 1% scoring above 650  - The last digit of the narcotic, sedative, and stimulant score indicates the number of active prescriptions of that type  - Higher Use scores correlate with increased prescribers, pharmacies, mg equiv, and overlapping prescriptions  - Higher Overdose Risk Scores correlate with increased risk of unintentional overdose death   Concerning or unexpectedly high scores should prompt a review of the PMP record; this does not constitute checking PMP for prescribing purposes.    E.D. Visit Count (12 mo.)  Facility Visits   [place] [facility] 9   Total 9   Note: Visits indicate total known visits.     Recent Emergency Department Visit Summary  Date Facility [facility] Type Diagnoses or Chief Complaint    May 22, 2021  [place] [facility]  [place]  [place]  Emergency      Abd Pain/Weakness      Apr 21, 2021  [place] [facility]  [place]  [place]  Emergency      headache, body aches, leg tingling      Dizziness      Chest wall pain      Chondrocostal junction syndrome [[place]]      Mar 08, 2021  [place] [facility]  [place]  [place]  Emergency      Medic      Altered Mental Status      Altered mental status, unspecified      Mar 04, 2021  [place] [facility]  [place]  [place]  Emergency      cough/body aches      Generalized Body  Aches      Myalgia, unspecified site      Sciatica, left side      Contact with and (suspected) exposure to COVID-19      Urinary tract infection, site not specified      Jan 03, 2021  [place] [facility]  [place]  [place]  Emergency      pain in both legs/cough      Generalized weakness      Dizziness      Chest Pain      Weakness      Paresthesia of skin      Dizziness and giddiness      Chest pain, unspecified      Nov 24, 2020  [place] [facility]  [place]  [place]  Emergency      dizziness, tingling      dizziness, tingling on left side      dizziness, tingling on left side, bodyaches      Dizziness      Polyneuropathy, unspecified      Malignant neoplasm of unspecified site of  unspecified female breast      Sep 27, 2020  [place] [facility]  [place]  [place]  Emergency      Body Pain      Extremity Weakness      Weakness      Contact with and (suspected) exposure to covid-19      Aug 30, 2020   [facility]  [place]  [place]  Emergency      headache,vomiting,back pain      Dizziness      Cough      Solitary pulmonary nodule      Pneumonia, unspecified organism      May 28, 2020   [facility]  [place]  [place]  Emergency      vaginal/rectal bleeding/dizziness      Headache      Tingling      Dizziness and giddiness        Recent Inpatient Visit Summary  No Recent Inpatient Visits were found.  Care Team  Provider Specialty Phone Fax Service Dates   Rondel Baton, M.D. Internal Medicine   Current    DAVIDSON, Janet Berlin MD M, MD Family Medicine: Adult Medicine [phone] Current      Collective Portal  This patient has registered at the [facility] Emergency Department   For more information visit: [email or web address]     PLEASE NOTE:     1.   Any care recommendations and other clinical information are provided as guidelines or for historical purposes only, and providers should exercise their own clinical  judgment when providing care.    2.   You may only use this information for purposes of treatment, payment or health care operations activities, and subject to the limitations of applicable Collective Policies.    3.   You should consult directly with the organization that provided a care guideline or other clinical history with any questions about additional information or accuracy or completeness of information provided.    ? 2022 [place] - [email or web address]

## 2021-05-23 DIAGNOSIS — R933 Abnormal findings on diagnostic imaging of other parts of digestive tract: Secondary | ICD-10-CM

## 2021-05-23 DIAGNOSIS — K529 Noninfective gastroenteritis and colitis, unspecified: Secondary | ICD-10-CM

## 2021-05-23 DIAGNOSIS — N179 Acute kidney failure, unspecified: Secondary | ICD-10-CM

## 2021-05-23 LAB — CBC AND DIFFERENTIAL
Absolute NRBC: 0 10*3/uL (ref 0.00–0.00)
Basophils Absolute Automated: 0.02 10*3/uL (ref 0.00–0.08)
Basophils Automated: 0.1 %
Eosinophils Absolute Automated: 0 10*3/uL (ref 0.00–0.44)
Eosinophils Automated: 0 %
Hematocrit: 36.7 % (ref 34.7–43.7)
Hgb: 10.9 g/dL — ABNORMAL LOW (ref 11.4–14.8)
Immature Granulocytes Absolute: 0.08 10*3/uL — ABNORMAL HIGH (ref 0.00–0.07)
Immature Granulocytes: 0.4 %
Lymphocytes Absolute Automated: 0.55 10*3/uL (ref 0.42–3.22)
Lymphocytes Automated: 2.7 %
MCH: 26.3 pg (ref 25.1–33.5)
MCHC: 29.7 g/dL — ABNORMAL LOW (ref 31.5–35.8)
MCV: 88.6 fL (ref 78.0–96.0)
MPV: 12 fL (ref 8.9–12.5)
Monocytes Absolute Automated: 0.71 10*3/uL (ref 0.21–0.85)
Monocytes: 3.4 %
Neutrophils Absolute: 19.23 10*3/uL — ABNORMAL HIGH (ref 1.10–6.33)
Neutrophils: 93.4 %
Nucleated RBC: 0 /100 WBC (ref 0.0–0.0)
Platelets: 335 10*3/uL (ref 142–346)
RBC: 4.14 10*6/uL (ref 3.90–5.10)
RDW: 13 % (ref 11–15)
WBC: 20.59 10*3/uL — ABNORMAL HIGH (ref 3.10–9.50)

## 2021-05-23 LAB — BASIC METABOLIC PANEL
Anion Gap: 12 (ref 5.0–15.0)
BUN: 12 mg/dL (ref 7.0–19.0)
CO2: 23 mEq/L (ref 22–29)
Calcium: 8.9 mg/dL (ref 7.9–10.2)
Chloride: 107 mEq/L (ref 100–111)
Creatinine: 1 mg/dL (ref 0.6–1.0)
Glucose: 188 mg/dL — ABNORMAL HIGH (ref 70–100)
Potassium: 4.6 mEq/L (ref 3.5–5.1)
Sodium: 142 mEq/L (ref 136–145)

## 2021-05-23 LAB — LACTIC ACID, PLASMA
Lactic Acid: 2.5 mmol/L — ABNORMAL HIGH (ref 0.2–2.0)
Lactic Acid: 1.9 mmol/L (ref 0.2–2.0)

## 2021-05-23 LAB — GFR: EGFR: >60

## 2021-05-23 MED ORDER — SUGAMMADEX SODIUM 200 MG/2ML IV SOLN
INTRAVENOUS | Status: AC
Start: 2021-05-23 — End: ?
  Filled 2021-05-23: qty 2

## 2021-05-23 MED ORDER — HYDROMORPHONE HCL 1 MG/ML IJ SOLN
0.4000 mg | Freq: Four times a day (QID) | INTRAMUSCULAR | Status: DC | PRN
Start: 2021-05-23 — End: 2021-06-01
  Administered 2021-05-27: 0.4 mg via INTRAVENOUS
  Filled 2021-05-23: qty 1

## 2021-05-23 MED ORDER — ROCURONIUM BROMIDE 10 MG/ML IV SOLN (WRAP)
INTRAVENOUS | Status: DC | PRN
Start: 2021-05-22 — End: 2021-05-23
  Administered 2021-05-22: 50 mg via INTRAVENOUS
  Administered 2021-05-23: 10 mg via INTRAVENOUS

## 2021-05-23 MED ORDER — HYDROMORPHONE HCL 1 MG/ML IJ SOLN
INTRAMUSCULAR | Status: DC | PRN
Start: 2021-05-23 — End: 2021-05-23
  Administered 2021-05-23: 1 mg via INTRAVENOUS

## 2021-05-23 MED ORDER — DEXTROSE 5% IV BOLUS
250.0000 mL | INTRAVENOUS | Status: DC | PRN
Start: 2021-05-23 — End: 2021-06-01

## 2021-05-23 MED ORDER — ALBUMIN HUMAN/BIOSIMILIAR 5% IV SOLN (WRAP)
INTRAVENOUS | Status: DC | PRN
Start: 2021-05-23 — End: 2021-05-23

## 2021-05-23 MED ORDER — LACTATED RINGERS IV SOLN
INTRAVENOUS | Status: DC | PRN
Start: 2021-05-22 — End: 2021-05-23

## 2021-05-23 MED ORDER — PIPERACILLIN-TAZOBACTAM 2.25 GM MBP (CNR)
2.2500 g | Freq: Four times a day (QID) | INTRAVENOUS | Status: DC
Start: 2021-05-23 — End: 2021-05-23
  Administered 2021-05-23 (×2): 2250 mg via INTRAVENOUS
  Filled 2021-05-23: qty 2250
  Filled 2021-05-23: qty 1
  Filled 2021-05-23 (×3): qty 2250
  Filled 2021-05-23: qty 1

## 2021-05-23 MED ORDER — TRAMADOL HCL 50 MG PO TABS
50.0000 mg | ORAL_TABLET | Freq: Four times a day (QID) | ORAL | Status: DC | PRN
Start: 2021-05-23 — End: 2021-06-01
  Administered 2021-05-23 – 2021-05-30 (×8): 50 mg via ORAL
  Filled 2021-05-23 (×8): qty 1

## 2021-05-23 MED ORDER — HYDROMORPHONE HCL 1 MG/ML IJ SOLN
0.5000 mg | INTRAMUSCULAR | Status: DC | PRN
Start: 2021-05-23 — End: 2021-05-23
  Administered 2021-05-23 (×2): 0.5 mg via INTRAVENOUS
  Filled 2021-05-23 (×2): qty 1

## 2021-05-23 MED ORDER — ROCURONIUM BROMIDE 50 MG/5ML IV SOLN
INTRAVENOUS | Status: AC
Start: 2021-05-23 — End: ?
  Filled 2021-05-23: qty 5

## 2021-05-23 MED ORDER — ONDANSETRON HCL 4 MG/2ML IJ SOLN
4.0000 mg | Freq: Once | INTRAMUSCULAR | Status: DC | PRN
Start: 2021-05-23 — End: 2021-05-23

## 2021-05-23 MED ORDER — HYDROMORPHONE HCL 0.5 MG/0.5 ML IJ SOLN
0.5000 mg | INTRAMUSCULAR | Status: DC | PRN
Start: 2021-05-23 — End: 2021-05-23

## 2021-05-23 MED ORDER — DOCUSATE SODIUM 100 MG PO CAPS
200.0000 mg | ORAL_CAPSULE | Freq: Every day | ORAL | Status: DC
Start: 2021-05-23 — End: 2021-06-01
  Administered 2021-05-23 – 2021-05-27 (×5): 200 mg via ORAL
  Filled 2021-05-23 (×9): qty 2

## 2021-05-23 MED ORDER — BUPIVACAINE HCL (PF) 0.25 % IJ SOLN
INTRAMUSCULAR | Status: DC | PRN
Start: 2021-05-23 — End: 2021-05-23
  Administered 2021-05-23: 10 mL

## 2021-05-23 MED ORDER — MELATONIN 3 MG PO TABS
3.0000 mg | ORAL_TABLET | Freq: Every evening | ORAL | Status: DC | PRN
Start: 2021-05-23 — End: 2021-06-01
  Administered 2021-05-24 – 2021-05-30 (×3): 3 mg via ORAL
  Filled 2021-05-23 (×4): qty 1

## 2021-05-23 MED ORDER — DEXTROSE 50 % IV SOLN
25.0000 g | INTRAVENOUS | Status: DC | PRN
Start: 2021-05-23 — End: 2021-06-01

## 2021-05-23 MED ORDER — SUCCINYLCHOLINE CHLORIDE 20 MG/ML IJ SOLN
INTRAMUSCULAR | Status: DC | PRN
Start: 2021-05-22 — End: 2021-05-23
  Administered 2021-05-22: 120 mg via INTRAVENOUS

## 2021-05-23 MED ORDER — MELATONIN 3 MG PO TABS
3.0000 mg | ORAL_TABLET | Freq: Every evening | ORAL | Status: DC | PRN
Start: 2021-05-23 — End: 2021-05-23

## 2021-05-23 MED ORDER — HYDRALAZINE HCL 20 MG/ML IJ SOLN
5.0000 mg | INTRAMUSCULAR | Status: DC | PRN
Start: 2021-05-23 — End: 2021-05-23

## 2021-05-23 MED ORDER — DEXTROSE 10 % IV BOLUS
25.0000 g | INTRAVENOUS | Status: DC | PRN
Start: 2021-05-23 — End: 2021-06-01

## 2021-05-23 MED ORDER — PIPERACILLIN-TAZOBACTAM 4.5 GM MBP (CNR)
4.5000 g | Freq: Three times a day (TID) | INTRAVENOUS | Status: DC
Start: 2021-05-23 — End: 2021-06-01
  Administered 2021-05-23 – 2021-06-01 (×27): 4500 mg via INTRAVENOUS
  Filled 2021-05-23 (×3): qty 1
  Filled 2021-05-23: qty 4500
  Filled 2021-05-23 (×3): qty 1
  Filled 2021-05-23: qty 4500
  Filled 2021-05-23 (×5): qty 1
  Filled 2021-05-23: qty 4500
  Filled 2021-05-23 (×4): qty 1
  Filled 2021-05-23: qty 4500
  Filled 2021-05-23: qty 1
  Filled 2021-05-23: qty 4500
  Filled 2021-05-23 (×2): qty 1
  Filled 2021-05-23: qty 4500
  Filled 2021-05-23 (×4): qty 1
  Filled 2021-05-23 (×2): qty 4500

## 2021-05-23 MED ORDER — PROMETHAZINE HCL 25 MG/ML IJ SOLN
6.2500 mg | Freq: Once | INTRAMUSCULAR | Status: DC | PRN
Start: 2021-05-23 — End: 2021-05-23

## 2021-05-23 MED ORDER — LABETALOL HCL 5 MG/ML IV SOLN (WRAP)
5.0000 mg | INTRAVENOUS | Status: DC | PRN
Start: 2021-05-23 — End: 2021-05-23

## 2021-05-23 MED ORDER — HEPARIN SODIUM (PORCINE) 5000 UNIT/ML IJ SOLN
5000.0000 [IU] | Freq: Three times a day (TID) | INTRAMUSCULAR | Status: DC
Start: 2021-05-23 — End: 2021-06-01
  Administered 2021-05-23 – 2021-06-01 (×29): 5000 [IU] via SUBCUTANEOUS
  Filled 2021-05-23 (×29): qty 1

## 2021-05-23 MED ORDER — LIDOCAINE HCL (PF) 2 % IJ SOLN
INTRAMUSCULAR | Status: DC | PRN
Start: 2021-05-22 — End: 2021-05-23
  Administered 2021-05-22: 100 mg via INTRADERMAL

## 2021-05-23 MED ORDER — SUGAMMADEX SODIUM 200 MG/2ML IV SOLN
INTRAVENOUS | Status: DC | PRN
Start: 2021-05-23 — End: 2021-05-23
  Administered 2021-05-23: 100 mg via INTRAVENOUS

## 2021-05-23 MED ORDER — ONDANSETRON HCL 4 MG/2ML IJ SOLN
INTRAMUSCULAR | Status: AC
Start: 2021-05-23 — End: ?
  Filled 2021-05-23: qty 2

## 2021-05-23 MED ORDER — LEVOTHYROXINE SODIUM 25 MCG PO TABS
25.0000 ug | ORAL_TABLET | Freq: Every morning | ORAL | Status: DC
Start: 2021-05-23 — End: 2021-06-01
  Administered 2021-05-23 – 2021-06-01 (×10): 25 ug via ORAL
  Filled 2021-05-23 (×10): qty 1

## 2021-05-23 MED ORDER — TRAMADOL HCL 50 MG PO TABS
50.0000 mg | ORAL_TABLET | Freq: Four times a day (QID) | ORAL | Status: DC | PRN
Start: 2021-05-23 — End: 2021-05-23

## 2021-05-23 MED ORDER — MAGNESIUM SULFATE IN D5W 1-5 GM/100ML-% IV SOLN
1.0000 g | INTRAVENOUS | Status: DC | PRN
Start: 2021-05-23 — End: 2021-06-01
  Administered 2021-05-27 (×2): 1 g via INTRAVENOUS
  Filled 2021-05-23: qty 100

## 2021-05-23 MED ORDER — FENTANYL CITRATE (PF) 50 MCG/ML IJ SOLN (WRAP)
INTRAMUSCULAR | Status: AC
Start: 2021-05-23 — End: ?
  Filled 2021-05-23: qty 1

## 2021-05-23 MED ORDER — DEXAMETHASONE SODIUM PHOSPHATE 4 MG/ML IJ SOLN (WRAP)
INTRAMUSCULAR | Status: DC | PRN
Start: 2021-05-23 — End: 2021-05-23
  Administered 2021-05-23: 4 mg via INTRAVENOUS

## 2021-05-23 MED ORDER — AMMONIA AROMATIC IN INHA
1.0000 | Freq: Once | RESPIRATORY_TRACT | Status: DC | PRN
Start: 2021-05-23 — End: 2021-05-23

## 2021-05-23 MED ORDER — NALOXONE HCL 0.4 MG/ML IJ SOLN (WRAP)
0.2000 mg | INTRAMUSCULAR | Status: DC | PRN
Start: 2021-05-23 — End: 2021-06-01

## 2021-05-23 MED ORDER — ONDANSETRON HCL 4 MG/2ML IJ SOLN
INTRAMUSCULAR | Status: DC | PRN
Start: 2021-05-23 — End: 2021-05-23
  Administered 2021-05-23: 4 mg via INTRAVENOUS

## 2021-05-23 MED ORDER — SODIUM CHLORIDE 0.9 % IV BOLUS
500.0000 mL | Freq: Once | INTRAVENOUS | Status: AC
Start: 2021-05-23 — End: 2021-05-23
  Administered 2021-05-23: 10:00:00 500 mL via INTRAVENOUS

## 2021-05-23 MED ORDER — LACTATED RINGERS IV SOLN
INTRAVENOUS | Status: DC
Start: 2021-05-23 — End: 2021-05-23

## 2021-05-23 MED ORDER — AMLODIPINE BESYLATE 5 MG PO TABS
10.0000 mg | ORAL_TABLET | Freq: Every day | ORAL | Status: DC
Start: 2021-05-23 — End: 2021-06-01
  Administered 2021-05-23 – 2021-06-01 (×9): 10 mg via ORAL
  Filled 2021-05-23 (×10): qty 2

## 2021-05-23 MED ORDER — GLUCAGON 1 MG IJ SOLR (WRAP)
1.0000 mg | INTRAMUSCULAR | Status: DC | PRN
Start: 2021-05-23 — End: 2021-06-01

## 2021-05-23 MED ORDER — LIDOCAINE 5 % EX PTCH
1.0000 | MEDICATED_PATCH | CUTANEOUS | Status: AC
Start: 2021-05-23 — End: 2021-05-26
  Administered 2021-05-23 – 2021-05-26 (×4): 1 via TRANSDERMAL
  Filled 2021-05-23 (×5): qty 1

## 2021-05-23 MED ORDER — POTASSIUM CHLORIDE 10 MEQ/100ML IV SOLN
10.0000 meq | INTRAVENOUS | Status: DC | PRN
Start: 2021-05-23 — End: 2021-06-01
  Administered 2021-05-27 (×2): 10 meq via INTRAVENOUS
  Filled 2021-05-23 (×3): qty 100

## 2021-05-23 MED ORDER — LISINOPRIL 20 MG PO TABS
40.0000 mg | ORAL_TABLET | Freq: Every day | ORAL | Status: DC
Start: 2021-05-23 — End: 2021-06-01
  Administered 2021-05-23 – 2021-06-01 (×9): 40 mg via ORAL
  Filled 2021-05-23 (×10): qty 2

## 2021-05-23 MED ORDER — GABAPENTIN 100 MG PO CAPS
100.0000 mg | ORAL_CAPSULE | Freq: Three times a day (TID) | ORAL | Status: DC
Start: 2021-05-23 — End: 2021-05-28
  Administered 2021-05-23 – 2021-05-28 (×15): 100 mg via ORAL
  Filled 2021-05-23 (×15): qty 1

## 2021-05-23 MED ORDER — DIPHENHYDRAMINE HCL 50 MG/ML IJ SOLN
6.2500 mg | INTRAMUSCULAR | Status: DC | PRN
Start: 2021-05-23 — End: 2021-05-23

## 2021-05-23 MED ORDER — HYDROMORPHONE HCL 1 MG/ML IJ SOLN
0.4000 mg | INTRAMUSCULAR | Status: DC | PRN
Start: 2021-05-23 — End: 2021-05-23
  Administered 2021-05-23: 0.4 mg via INTRAVENOUS
  Filled 2021-05-23: qty 1

## 2021-05-23 MED ORDER — FENTANYL CITRATE (PF) 50 MCG/ML IJ SOLN (WRAP)
INTRAMUSCULAR | Status: DC | PRN
Start: 2021-05-22 — End: 2021-05-23
  Administered 2021-05-22: 100 ug via INTRAVENOUS
  Administered 2021-05-23 (×2): 50 ug via INTRAVENOUS

## 2021-05-23 MED ORDER — MECLIZINE HCL 12.5 MG PO TABS
25.0000 mg | ORAL_TABLET | Freq: Three times a day (TID) | ORAL | Status: DC | PRN
Start: 2021-05-23 — End: 2021-06-01
  Filled 2021-05-23 (×3): qty 2

## 2021-05-23 MED ORDER — HYDROMORPHONE HCL 1 MG/ML IJ SOLN
INTRAMUSCULAR | Status: AC
Start: 2021-05-23 — End: ?
  Filled 2021-05-23: qty 1

## 2021-05-23 MED ORDER — INDOCYANINE GREEN 25 MG IV SOLR
INTRAVENOUS | Status: DC | PRN
Start: 2021-05-23 — End: 2021-05-23
  Administered 2021-05-23: 2.5 mg via INTRAVENOUS

## 2021-05-23 MED ORDER — ROSUVASTATIN CALCIUM 10 MG PO TABS
20.0000 mg | ORAL_TABLET | Freq: Every day | ORAL | Status: DC
Start: 2021-05-23 — End: 2021-06-01
  Administered 2021-05-23 – 2021-06-01 (×10): 20 mg via ORAL
  Filled 2021-05-23 (×10): qty 2

## 2021-05-23 MED ORDER — PROPOFOL 10 MG/ML IV EMUL (WRAP)
INTRAVENOUS | Status: DC | PRN
Start: 2021-05-22 — End: 2021-05-23
  Administered 2021-05-22: 120 mg via INTRAVENOUS

## 2021-05-23 MED ORDER — POTASSIUM & SODIUM PHOSPHATES 280-160-250 MG PO PACK
2.0000 | PACK | ORAL | Status: DC | PRN
Start: 2021-05-23 — End: 2021-06-01
  Administered 2021-05-27: 17:00:00 2 via ORAL
  Filled 2021-05-23 (×2): qty 2

## 2021-05-23 MED ORDER — ACETAMINOPHEN 160 MG/5ML PO SOLN
650.0000 mg | Freq: Four times a day (QID) | ORAL | Status: DC
Start: 2021-05-23 — End: 2021-05-25
  Administered 2021-05-23 – 2021-05-25 (×9): 650 mg via ORAL
  Filled 2021-05-23 (×13): qty 20.3

## 2021-05-23 MED ORDER — DEXAMETHASONE SOD PHOSPHATE PF 10 MG/ML IJ SOLN
INTRAMUSCULAR | Status: AC
Start: 2021-05-23 — End: ?
  Filled 2021-05-23: qty 1

## 2021-05-23 MED ORDER — POTASSIUM CHLORIDE CRYS ER 20 MEQ PO TBCR
0.0000 meq | EXTENDED_RELEASE_TABLET | ORAL | Status: DC | PRN
Start: 2021-05-23 — End: 2021-06-01
  Administered 2021-05-27 – 2021-05-29 (×2): 40 meq via ORAL
  Filled 2021-05-23: qty 16
  Filled 2021-05-23: qty 2

## 2021-05-23 MED ORDER — DEXTROSE-SODIUM CHLORIDE 5-0.45 % IV SOLN
INTRAVENOUS | Status: DC
Start: 2021-05-23 — End: 2021-05-29

## 2021-05-23 NOTE — Transfer of Care (Signed)
Anesthesia Transfer of Care Note    Patient: Emily [name]    Procedures performed: Procedure(s):  LAPAROSCOPY, DIAGNOSTIC  EXPLORATORY LAPAROTOMY  LAPAROTOMY, COLECTOMY, RIGHT    Anesthesia type: General ETT    Patient location:Phase I PACU    Last vitals:   Vitals:    05/22/21 2345   BP: 126/60   Pulse: 79   Resp: (!) 35   Temp:    SpO2: 96%       Post pain: Patient not complaining of pain, continue current therapy      Mental Status:awake    Respiratory Function: tolerating face mask    Cardiovascular: stable    Nausea/Vomiting: patient not complaining of nausea or vomiting    Hydration Status: adequate    Post assessment: no apparent anesthetic complications    Signed by: Delma Freeze, MD  05/23/21 1:56 AM

## 2021-05-23 NOTE — Op Note (Addendum)
FULL OPERATIVE NOTE    Date Time: 05/23/21 1:20 AM  Patient Name: Emily [name]  Attending Physician: Gaynelle Adu, MD      Date of Operation:   05/22/2021    Providers Performing:   Surgeon(s):  Cinda Quest, [place]  Delena Serve, MD    Circulator: Baxter Hire, RN  First Assistant: Alinda Sierras    Operative Procedure:   Procedure(s):  LAPAROSCOPY, DIAGNOSTIC  EXPLORATORY LAPAROTOMY  LAPAROTOMY, COLECTOMY, RIGHT ( ileocectomy)    Preoperative Diagnosis:   Pre-Op Diagnosis Codes:     * Ischemia, bowel [K55.9]    Postoperative Diagnosis:   Post-Op Diagnosis Codes:     * Ischemia, bowel [K55.9]     * Gangrene concurrent with and due to internal hernia of abdomen [K46.1]    Indications:   Acute abdomen with ct findings concerning for ischemic bowel      Findings: internal hernia bands and ischemic cecum  Operative Notes:   Patient after informed consent underwent geta. Scds in place, heparin given, therapeutic antibiotics given in ed. Duraprep used for prep and foley and ngt placed.  I made a small opening at the umbilcus and entered with a 5mm port. I looked with 30 degree scope and saw fluid in abdomen and ischemic looking cecum. We converted to midline laparotomy.  Exploration of the entire abdomen revealed an internal hernia that the cecum was not currently entraped but that was adjacent a loop of small bowel in the pelvis created by adhesions from prior surgery to itself and the abdominal wall. I released these adhesions.  The terminal ileum was viable though slightly thickened. The cecum had evidence of  a small  4cm patch of black necrosis without free perforation.  Proximal colon appeared normal once you got to the hepatic flexure and the transverse colon. I took down the white line of toldt and the hepatic flexure. I divided the terminal ileum about 8 cm from the ileocecal valve. I took the ileocecal artery with o ties and the mesentery with ligasure.  I divided colon proximal to this near  the hepatic flexure in a viable location. I confirmed viability with icg intraop. I then performed a side to side functional end to end anastomosis with 60 endogia purple common stapled line and ta 60 to close this channel.  The mucosa was viable and normal.  Some stool thickened was in the ileum suggesting possible chronic obstruction or recurrent obstruction.    We irrigated with saline and then placed seprafilm beneath the fascia. Fascia was closed with #1 pds x2 abd then dermis with 3-0 vicryl, skin with 4-0 monocryl and prevena was placed.    She was extubated and transferred to pacu with foley and ngt in place    Contaminated case    Ivf 1100 LR, 250 albumin  350 uop  50 ngt    She was extubated and transferred to recovery.    All counts were correct  Estimated Blood Loss:    < 20  ml    Implants:   Seprafilm was placed    Drains:   Drains: no    Specimens:     ID Type Source Tests Collected by Time Destination   A : ILEOCECECTOMY Segmental resection Colon (specify site e.g. ascending, transverse, etc.) SURGICAL PATHOLOGY Delena Serve, MD 05/23/2021 [phone]          Complications:   none    Signed by: Delena Serve, MD, MD

## 2021-05-23 NOTE — Plan of Care (Signed)
Pain controlled. Able to slept after IV dilaudid administered. Denies any nausea or shortness of breath. On continuous pulse ox. SR on tele monitor. Abdominal incision with prevena vac. No output noted. Foley intact. IVF infusing. Fall precautions maintained.     Problem: Safety  Goal: Patient will be free from injury during hospitalization  Outcome: Progressing  Flowsheets (Taken 05/23/2021 0608)  Patient will be free from injury during hospitalization:   Assess patient's risk for falls and implement fall prevention plan of care per policy   Provide and maintain safe environment   Hourly rounding  Goal: Patient will be free from infection during hospitalization  Outcome: Progressing  Flowsheets (Taken 05/23/2021 0608)  Free from Infection during hospitalization:   Assess and monitor for signs and symptoms of infection   Monitor all insertion sites (i.e. indwelling lines, tubes, urinary catheters, and drains)   Encourage patient and family to use good hand hygiene technique     Problem: Pain  Goal: Pain at adequate level as identified by patient  Outcome: Progressing  Flowsheets (Taken 05/23/2021 0608)  Pain at adequate level as identified by patient:   Identify patient comfort function goal   Assess for risk of opioid induced respiratory depression, including snoring/sleep apnea. Alert healthcare team of risk factors identified.   Assess pain on admission, during daily assessment and/or before any "as needed" intervention(s)   Reassess pain within 30-60 minutes of any procedure/intervention, per Pain Assessment, Intervention, Reassessment (AIR) Cycle   Evaluate if patient comfort function goal is met   Offer non-pharmacological pain management interventions   Evaluate patient's satisfaction with pain management progress     Problem: Side Effects from Pain Analgesia  Goal: Patient will experience minimal side effects of analgesic therapy  Outcome: Progressing  Flowsheets (Taken 05/23/2021 0608)  Patient will experience  minimal side effects of analgesic therapy:   Monitor/assess patient's respiratory status (RR depth, effort, breath sounds)   Assess for changes in cognitive function   Prevent/manage side effects per LIP orders (i.e. nausea, vomiting, pruritus, constipation, urinary retention, etc.)   Evaluate for opioid-induced sedation with appropriate assessment tool (i.e. POSS)     Problem: Inadequate Airway Clearance  Goal: Patent Airway maintained  Outcome: Progressing  Flowsheets (Taken 05/23/2021 0610)  Patent airway maintained:   Position patient for maximum ventilatory efficiency   Reinforce use of ordered respiratory interventions (i.e. CPAP, BiPAP, Incentive Spirometer, Acapella, etc.)   Reposition patient every 2 hours and as needed unless able to self-reposition     Problem: Infection Prevention  Goal: Free from infection  Outcome: Progressing  Flowsheets (Taken 05/23/2021 0610)  Free from infection:   Assess incision for evidence of healing   Encourage/assist patient to turn, cough and perform deep breathing every 2 hours   Monitor/assess vital signs   Assess for signs and symptoms of infection     Problem: Constipation  Goal: Fluid and electrolyte balance are achieved/maintained  Outcome: Progressing  Flowsheets (Taken 05/23/2021 0610)  Fluid and electrolyte balance are achieved/maintained:   Monitor intake and output every shift   Provide adequate hydration

## 2021-05-23 NOTE — UM Notes (Addendum)
INPATIENT CLINICAL 05/22/21    Emily [name]   75 y.o., 09/03/1946    Small bowel ischemia  HPI: Emily [name] is a 75 y.o. female with sudden onset of abdominal pain right side yesterday that waxed and waned and then got severely worse today bringing her to ED. She had a colonoscopy 07/2020 with only sigmoid polyp and evidence of the prior colitis seen 03/2020 on ct scan.  Ct scan today in comparison is severe colitis on right side extending to the transverse colon. H/o of similar right sided colitis though less severe on review of ct scans 03/2020-- follow up scope was normal.  05/22/21 1910 -- 97.9 F (36.6 C) Oral 75 99 % -- 18 81/45      Lab 05/22/21  1935   WBC 14.81*   RBC 3.87*   Hgb 10.6*   Hematocrit 33.3*   Platelets 324   Glucose 152*   BUN 18.0   Creatinine 1.4*   Calcium 9.5   Sodium 140   Potassium 4.3   Chloride 103   CO2 26      Leukocyte Esterase, UA   Negative Trace      CT ABDOMEN PELVIS W IV/ WO PO CONT  1.  There is increased wall thickening of the cecum and ascending colon with hypoenhancement. A new small focus of gas is seen in the mesenteric  draining vein with new tiny foci of gas in the left hepatic lobe that are favored to reflect portal venous gas. Findings are concerning for bowel ischemia.  2.  Faint areas of hypoenhancement in the kidneys bilaterally can be seen with pyelonephritis, recommend correlation with urinalysis.  3.  Subcentimeter pulmonary nodules measuring up to 3 mm. Follow-up should be obtained based on Fleischner guidelines.  4.  The appendix is not visualized.  5.  Ancillary findings as described above.    Administration from 05/22/2021 1859 to 05/22/2021 2249   Date/Time Order Dose Route Action Comments    05/22/2021 2050 EDT sodium chloride 0.9 % bolus 1,000 mL 0 mL Intravenous Stopped --    05/22/2021 1950 EDT sodium chloride 0.9 % bolus 1,000 mL 1,000 mL Intravenous New Bag --    05/22/2021 1950 EDT piperacillin-tazobactam (ZOSYN) injection 4.5 g 4.5 g Intravenous  Given --    05/22/2021 2050 EDT iohexol (OMNIPAQUE) 350 MG/ML injection 100 mL 100 mL Intravenous Imaging Agent Given --    05/22/2021 2121 EDT ondansetron (ZOFRAN) injection 4 mg 4 mg Intravenous Given --    05/22/2021 2121 EDT HYDROmorphone (DILAUDID) injection 0.5 mg 0.5 mg Intravenous Given --    05/22/2021 2222 EDT sodium chloride 0.9 % bolus 1,000 mL 0 mL Intravenous Stopped stopped in PACU    05/22/2021 2122 EDT sodium chloride 0.9 % bolus 1,000 mL 1,000 mL Intravenous New Bag --     FINAL DIAGNOSIS:  Small bowel ischemia  Right lower quadrant abdominal pain  Admit to Inpatient (Order [id number])  Admission  Date: 05/22/2021 Department: [facility]     A/P:    Patient with abdominal pain, CT scan showed colitis of cecum and ascending colon.  She has leukocytosis 14,000 but lactate is currently 1.2.  Admit for serial exam and serial lactate,  IVF and Zosyn.    Will keep NPO.    General surgery has been consulted by ED doc.    Surgery Assessment:  75 y.o. female with possible ischemic colon or severe colitis. Imaging is concerning for possible  portal venous air, mesenteric vein air-- severe pain on exam. Wbc is 14.8, AKI with cr 1.4. lactate is normal  Today [birth year] birthday.     Surgery Plan:  Diagnostic laparoscopy, possible open, possible exlap and possible bowel resection, possible ostomy. Possible open abdomen also discussed  The ACS Risk Calculator was utilized to estimate the patient's chance of post-operative complications due to existing co-morbidities.  The results were presented to the patient in the pre-op holding area and discussed in detail.  All questions were answered and having understood, the patient agreed to proceed.      Above average - emergency surgery, wbc 14. Possible ischemic bowel.        Operative Procedure:   Procedure(s):  LAPAROSCOPY, DIAGNOSTIC  EXPLORATORY LAPAROTOMY  LAPAROTOMY, COLECTOMY, RIGHT ( ileocectomy)

## 2021-05-23 NOTE — Anesthesia Postprocedure Evaluation (Signed)
Anesthesia Post Evaluation    Patient: Emily [name]    Procedure(s):  LAPAROSCOPY, DIAGNOSTIC  EXPLORATORY LAPAROTOMY  LAPAROTOMY, COLECTOMY, RIGHT    Anesthesia type: general    Last Vitals:   Vitals Value Taken Time   BP 150/67 05/23/21 0200   Temp 98 05/23/21 0212   Pulse 82 05/23/21 0200   Resp 20 05/23/21 0200   SpO2 100 % 05/23/21 0200                 Anesthesia Post Evaluation:     Patient Evaluated: PACU  Patient Participation: complete - patient participated  Level of Consciousness: awake and alert  Pain Score: 0  Pain Management: adequate  Multimodal analgesia pain management approach    Airway Patency: patent    Anesthetic complications: No      PONV Status: none    Cardiovascular status: hemodynamically stable  Respiratory status: nonlabored ventilation and spontaneous ventilation  Hydration status: euvolemic        Signed by: Delma Freeze, MD, 05/23/2021 2:12 AM

## 2021-05-23 NOTE — Progress Notes (Signed)
Patient admitted from PACU  via bed  accompanied by Nurse. Family at the bedside. Oriented to the Unit and hospital policies. No personal or valuable belongings noted.  Updated history and home medications with the help of family member. Skin checked performed with other RN. No skin breakdown noted. Placed on heart monitor and continuous pulse ox.  Discussed plan of care. Verbalized understanding. Reminded to use call bell if needs help.

## 2021-05-23 NOTE — Progress Notes (Signed)
MEDICINE PROGRESS NOTE  [facility], DIVISION OF HOSPITALIST MEDICINE   [place] [facility]   [place] Pager: [phone]      Date Time: 05/23/21 3:34 PM  Patient Name: Emily [name]  Attending Physician: Louann Liv, DO  Hospital Day: 2  Assessment and Plan :     Active Hospital Problems    Diagnosis    Ischemic colon    History of colonic polyps    AKI (acute kidney injury)    Acute colitis    Mixed hyperlipidemia    Hypertension         This is a 75 y.o. female with PMH of breast cancer status post right breast mastectomy, GERD, hypertension, hypothyroidism who presented with acute abdominal pain and was found to have right-sided colitis.        Right sided colitis  Status post exploratory laparotomy and right hemicolectomy  Patient removed NG tube  We will keep patient n.p.o. continue IV fluids  Continue wound VAC  Continue Zosyn  Follow-up blood culture results  Activity as tolerated  Consult PT and OT  General surgery following closely    Essential hypertension  Continue amlodipine and lisinopril    Hypothyroidism  Continue Synthroid    Hyperlipidemia  Continue Crestor      DVT Prophylaxis:  -- Continue Heparin SC 5000 units SC Q12H    Foley/ lines-    I spent 35 minutes on the unit in direct care for this patient  > 50% in counseling and coordination of care for conditions described in my assessment and plan.       Case was discussed with Pt ,RN at bedside.       Disposition:     Today's date: 05/23/2021  Length of Stay: 1  Anticipated medical stability for discharge: 1-2 days   Reason for ongoing hospitalization: colitis  Lines:     Patient Lines/Drains/Airways Status       Active PICC Line / CVC Line / PIV Line / Drain / Airway / Intraosseous Line / Epidural Line / ART Line / Line / Wound / Pressure Ulcer / NG/OG Tube       Name Placement date Placement time Site Days    Peripheral IV 05/22/21 20 G Left Antecubital 05/22/21  1940  Antecubital  less than 1    Urethral Catheter  Non-latex;Temperature probe 16 Fr. 05/22/21  2357  Non-latex;Temperature probe  less than 1    Wound 05/23/21 Surgical Incision Abdomen Other (Comment) 05/23/21  0040  Abdomen  less than 1    NG/OG Tube Nasogastric 16 Fr. Right nostril 05/23/21  0117  Right nostril  less than 1                     Subjective/ROS/24 hr events:   CC: Ischemic colon  Interval History/24 hour events: Hemicolectomy  Diet NPO effective now Except for: SIPS WITH MEDS  HPI/Subjective: Patient seen and examined.  She is status post exploratory laparotomy and right-sided hemicolectomy.  She continues to have abdominal pain but mostly well controlled with current pain medication regimen.  She denies any chest pain or shortness of breath.  No nausea or vomiting.  No fevers.       Review of Systems:   Review of Systems - Negative except as above in HPI    Physical Exam:     Temp:  [97 F (36.1 C)-98.2 F (36.8 C)] 98.2 F (36.8 C)  Heart Rate:  [75-92]  87  Resp Rate:  [12-35] 18  BP: (81-154)/(45-72) 137/71  Intake and Output Summary-last 24 Hrs:  I/O last 3 completed shifts:  In: 2883.33 [I.V.:1633.33; IV Piggyback:1250]  Out: 1425 [Urine:1375; Blood:50]  O2 Flow Rate (L/min)  Avg: 3 L/min  Min: 2 L/min  Max: 5 L/min by O2 Device: None (Room air)       General: WD female  in no acute distress,  Cardiovascular: regular rate and rhythm, no murmurs  Lungs: clear to auscultation bilaterally, no additional sounds  Abdomen: soft, nondistended with mild tenderness to palpation diffusely and wound VAC in place midline incision site  Extremities: no edema  Neurological: Alert and oriented X 3, moves all extremities.   Skin:no rash or lesion  Foley:  Central line:       Meds:   Medications were reviewed:  Scheduled Meds:  Current Facility-Administered Medications   Medication Dose Route Frequency    acetaminophen  650 mg Oral Q6H    amLODIPine  10 mg Oral Daily    docusate sodium  200 mg Oral Daily    gabapentin  100 mg Oral Q8H SCH    heparin  (porcine)  5,000 Units Subcutaneous Q8H SCH    heparin (porcine)  5,000 Units Subcutaneous Pre-Op    levothyroxine  25 mcg Oral QAM    lidocaine  1 patch Transdermal Q24H    lisinopril  40 mg Oral Daily    piperacillin-tazobactam  4.5 g Intravenous Q8H    rosuvastatin  20 mg Oral Daily     Continuous Infusions:   dextrose 5 % and 0.45% NaCl 100 mL/hr at 05/23/21 0322     PRN Meds:.Nursing communication: Adult Hypoglycemia Treatment Algorithm **AND** glucagon (rDNA) **AND** dextrose **AND** dextrose **AND** dextrose, traMADol **OR** HYDROmorphone, magnesium sulfate, meclizine, melatonin, naloxone, potassium & sodium phosphates, potassium chloride **AND** potassium chloride  Labs/Radiology:   Imaging personally reviewed, including: all available   CT Abd/ Pelvis with IV Contrast    Result Date: 05/22/2021  1.  There is increased wall thickening of the cecum and ascending colon with hypoenhancement. A new small focus of gas is seen in the mesenteric draining vein with new tiny foci of gas in the left hepatic lobe that are favored to reflect portal venous gas. Findings are concerning for bowel ischemia. 2.  Faint areas of hypoenhancement in the kidneys bilaterally can be seen with pyelonephritis, recommend correlation with urinalysis. 3.  Subcentimeter pulmonary nodules measuring up to 3 mm. Follow-up should be obtained based on Fleischner guidelines. 4.  The appendix is not visualized. 5.  Ancillary findings as described above. Findings were communicated to Dr. Delphina Cahill at 05/22/2021 9:00 PM. Ewing Schlein MD, MD  05/22/2021 9:05 PM   No results for input(s): GLUCOSEWB in the last 24 hours.  Recent Labs   Lab 05/23/21  0731 05/22/21  1935   Sodium 142 140   Potassium 4.6 4.3   Chloride 107 103   BUN 12.0 18.0   Creatinine 1.0 1.4*   EGFR >60.0 44.2   Glucose 188* 152*   Calcium 8.9 9.5     Recent Labs   Lab 05/23/21  0731 05/22/21  1935   WBC 20.59* 14.81*   Hgb 10.9* 10.6*   Hematocrit 36.7 33.3*   Platelets 335 324      Recent Labs   Lab 05/22/21  2143   PT 12.1   PT INR 1.1     Recent Labs   Lab 05/22/21  1935  Alkaline Phosphatase 80   Bilirubin, Total 0.3   ALT 11   AST (SGOT) 23             Signed by: Louann Liv, DO

## 2021-05-23 NOTE — Progress Notes (Addendum)
POST OPERATIVE PROGRESS NOTE  [facility]    Date Time: 05/23/21 8:58 AM  Patient Name: Danek,Hodaya B      ASSESSMENT:   1 Day Post-Op S/P Procedure(s):  LAPAROSCOPY, DIAGNOSTIC  EXPLORATORY LAPAROTOMY  LAPAROTOMY, COLECTOMY, RIGHT    PLAN:   Needs PT/ot   Ambulate  She self removed ngt-- will hold on replacement for now  Keep npo  Await bowel function in order to advance any diet  Repeat lactate, bolus 500 ml  Keep foley until lactate clears then can remove it-  Lactate on repeat 1.9    Leukocytosis likely reactive  Continue zosyn ,adjusted for improved creatinine clearance  Maintenance ivf tomorrow        SUBJECTIVE:   The patient is doing fairly well.  Post operative pain is well controlled with medications.  Current dietary status:  Diet NPO effective now Except for: SIPS WITH MEDS and tolerating well.  Flatus: no. BM:  no.  Additional complaints: none       OBJECTIVE:   Current Vitals:   Vitals:    05/23/21 0815   BP: 137/71   Pulse: 87   Resp: 18   Temp: 98.2 F (36.8 C)   SpO2: 91%       Intake and Output Summary (Last 24 hours):  I/O last 3 completed shifts:  In: 2883.33 [I.V.:1633.33; IV Piggyback:1250]  Out: 1425 [Urine:1375; Blood:50]    Labs:     Results       Procedure Component Value Units Date/Time    Lactic Acid [[id number]]  (Abnormal) Collected: 05/23/21 0731    Specimen: Blood Updated: 05/23/21 0744     Lactic Acid 2.5 mmol/L     Narrative:      Rescheduled by [id number] at 05/23/2021 07:25 Reason: Patient Declined Blood   Draw    Culture Blood Aerobic and Anaerobic [[id number]] Collected: 05/22/21 1935    Specimen: Arm from Blood, Venipuncture Updated: 05/23/21 0058    Narrative:      1 BLUE+1 PURPLE    ABO/Rh [[id number]] Collected: 05/22/21 1935     Updated: 05/22/21 2259     ABO Rh O POS    Type and Screen [[id number]] Collected: 05/22/21 2143    Specimen: Blood Updated: 05/22/21 2229     ABO Rh O POS     AB Screen Gel NEG    Prothrombin time/INR [[id number]] Collected: 05/22/21 2143     Specimen: Blood Updated: 05/22/21 2155     PT 12.1 sec      PT INR 1.1    COVID-19 (SARS-COV-2) ([facility]) [[id number]] Collected: 05/22/21 2122    Specimen: Nasopharyngeal Swab from Nasopharynx Updated: 05/22/21 2142     Purpose of COVID testing Screening     SARS-CoV-2 Specimen Source Nasopharyngeal     SARS CoV-2 Negative    Narrative:      o Collect and clearly label specimen type:  o Upper respiratory specimen: One Nasopharyngeal Dry Swab NO  Transport Media.  o Hand deliver to laboratory ASAP  Indication for testing->Extended care facility admission to  semi private room  Screening    Troponin I [[id number]] Collected: 05/22/21 1935    Specimen: Blood Updated: 05/22/21 2116     Troponin I <0.01 ng/mL     CBC and differential [[id number]]  (Abnormal) Collected: 05/22/21 1935    Specimen: Blood Updated: 05/22/21 2113     WBC 14.81 x10 3/uL      Hgb 10.6 g/dL  Hematocrit 33.3 %      Platelets 324 x10 3/uL      RBC 3.87 x10 6/uL      MCV 86.0 fL      MCH 27.4 pg      MCHC 31.8 g/dL      RDW 13 %      MPV 10.8 fL      Neutrophils 87.1 %      Lymphocytes Automated 8.6 %      Monocytes 3.7 %      Eosinophils Automated 0.1 %      Basophils Automated 0.2 %      Immature Granulocytes 0.3 %      Nucleated RBC 0.0 /100 WBC      Neutrophils Absolute 12.89 x10 3/uL      Lymphocytes Absolute Automated 1.28 x10 3/uL      Monocytes Absolute Automated 0.55 x10 3/uL      Eosinophils Absolute Automated 0.01 x10 3/uL      Basophils Absolute Automated 0.03 x10 3/uL      Immature Granulocytes Absolute 0.05 x10 3/uL      Absolute NRBC 0.00 x10 3/uL     Lactic Acid [[id number]] Collected: 05/22/21 1935    Specimen: Blood Updated: 05/22/21 2017     Lactic Acid 1.2 mmol/L     Narrative:      Cancel second order for specimen if the initial level is less  than 2.31mEq/L    Lipase [[id number]] Collected: 05/22/21 1935    Specimen: Blood Updated: 05/22/21 2013     Lipase 16 U/L     GFR [[id number]] Collected: 05/22/21 1935     Updated:  05/22/21 2013     EGFR 44.2    Comprehensive metabolic panel [[id number]]  (Abnormal) Collected: 05/22/21 1935    Specimen: Blood Updated: 05/22/21 2013     Glucose 152 mg/dL      BUN 72.5 mg/dL      Creatinine 1.4 mg/dL      Sodium 366 mEq/L      Potassium 4.3 mEq/L      Chloride 103 mEq/L      CO2 26 mEq/L      Calcium 9.5 mg/dL      Protein, Total 6.8 g/dL      Albumin 4.1 g/dL      AST (SGOT) 23 U/L      ALT 11 U/L      Alkaline Phosphatase 80 U/L      Bilirubin, Total 0.3 mg/dL      Globulin 2.7 g/dL      Albumin/Globulin Ratio 1.5     Anion Gap 11.0    Urinalysis Reflex to Microscopic Exam- Reflex to Culture [[id number]]  (Abnormal) Collected: 05/22/21 1935     Updated: 05/22/21 1959     Urine Type Urine, Clean Ca     Color, UA Yellow     Clarity, UA Clear     Specific Gravity UA 1.010     Urine pH 6.0     Leukocyte Esterase, UA Trace     Nitrite, UA Negative     Protein, UR Negative     Glucose, UA Negative     Ketones UA 5     Urobilinogen, UA Negative mg/dL      Bilirubin, UA Negative     Blood, UA Negative     RBC, UA 0 - 2 /hpf      WBC, UA 0 - 5 /hpf      Hyaline  Casts, UA 0 - 3 /lpf      Urine Mucus Present            Rads:     Radiology Results (24 Hour)       Procedure Component Value Units Date/Time    CT Abd/ Pelvis with IV Contrast [[id number]] Collected: 05/22/21 2052    Order Status: Completed Updated: 05/22/21 2107    Narrative:      CLINICAL INDICATION: Right lower quadrant tenderness    TECHNIQUE: Axial CT of the abdomen and pelvis was performed after  contrast. 100 mL Omnipaque 350 was administered intravenously. Coronal  and sagittal reconstructions were performed.    A combination of automatic exposure control, adjustment of the MA and/or  KV according to patient size and/or use of iterative reconstructive  technique was utilized.    COMPARISON: 03/14/2020    FINDINGS:  Mediastinum: Borderline cardiomegaly  Lung bases: Mild right basilar atelectasis with small calcified  granuloma and  subcentimeter pulmonary nodules measuring up to 3 mm.    Liver and biliary system: The subcentimeter focal hypodensity in the  liver is too small to characterize. Gallbladder is normal. No biliary  ductal dilation is noted. Tiny foci of gas are seen in the left hepatic  lobe.  Adrenal glands: Normal  Spleen: Normal  Pancreas: Normal  Kidneys, ureter and bladder: Small renal cysts and subcentimeter focal  hypodensities are too small to characterize. There are faint areas of  hypoenhancement in the kidneys bilaterally. No hydronephrosis. No focal  wall thickening of the urinary bladder.  Genital: Uterus is surgically absent.    Stomach and bowel: There is increased wall thickening of the cecum and  ascending colon with hypoenhancement. There is a new small focus of gas  in the mesenteric draining vein. No evidence of bowel obstruction.  Peritoneum and peritoneal surface: No free air is identified.    Vasculature: Vascular calcifications are noted.   Retroperitoneum and lymph nodes: No pathologically enlarged lymph nodes  are identified    Soft tissue: Postsurgical changes are seen in the anterior abdominal  wall. Calcifications in the subcutaneous fat may reflect granuloma or  fat necrosis.  Osseous structures: There are multilevel degenerative changes in the  spine.      Impression:        1.  There is increased wall thickening of the cecum and ascending colon  with hypoenhancement. A new small focus of gas is seen in the mesenteric  draining vein with new tiny foci of gas in the left hepatic lobe that  are favored to reflect portal venous gas. Findings are concerning for  bowel ischemia.  2.  Faint areas of hypoenhancement in the kidneys bilaterally can be  seen with pyelonephritis, recommend correlation with urinalysis.  3.  Subcentimeter pulmonary nodules measuring up to 3 mm. Follow-up  should be obtained based on Fleischner guidelines.  4.  The appendix is not visualized.  5.  Ancillary findings as described  above.    Findings were communicated to Dr. Delphina Cahill at 05/22/2021 9:00 PM.    Ewing Schlein MD, MD   05/22/2021 9:05 PM            Physical Exam:     Mental status - alert, oriented to person, place, and time, normal mood, behavior, speech, dress, motor activity, and thought processes  Chest - clear to auscultation, no wheezes, rales or rhonchi, symmetric air entry, no tachypnea, retractions or cyanosis  Heart - normal rate, regular  rhythm, normal S1, S2, no murmurs, rubs, clicks or gallops  Abdomen - tenderness noted as expected midline prevena in place  Wound - prevena  Extremities - peripheral pulses normal, no pedal edema, no clubbing or cyanosis      Signed by: Delena Serve, MD

## 2021-05-23 NOTE — PT Progress Note (Signed)
Physical Therapy Cancellation Note    Patient: Emily [name]  WNU:[id number]    Unit: Q034/V425.95    Patient not seen for physical therapy secondary to pt had been premedicated for PT, but upon arrival Pt extremely lethargic. Pt will wake to verbal stimuli, but unable to stay awake for eval. PT to continue to follow as schedule allows.         Signature:   Lennox Pippins, PT  05/23/2021  12:00 PM  Phone: [phone]    (For scheduling questions, please contact rehab tech [phone])

## 2021-05-23 NOTE — Plan of Care (Signed)
Assumed pt care AT 0700. See epic for VS and assessment. Pt c/o pain to abdomen. Pt rated pain 9/10. Pt received dilaudid , tramadol, lidocaine patch, and tylenol as ordered for pain. Pt stated she was not feeling well. Pt remain drowsy through the shift. She was unable to cooperate with PT due to pain.   Problem: Safety  Goal: Patient will be free from injury during hospitalization  Outcome: Progressing  Flowsheets (Taken 05/23/2021 0608 by Robert Bellow, RN)  Patient will be free from injury during hospitalization:   Assess patient's risk for falls and implement fall prevention plan of care per policy   Provide and maintain safe environment   Hourly rounding  Goal: Patient will be free from infection during hospitalization  Outcome: Progressing  Flowsheets (Taken 05/23/2021 0608 by Robert Bellow, RN)  Free from Infection during hospitalization:   Assess and monitor for signs and symptoms of infection   Monitor all insertion sites (i.e. indwelling lines, tubes, urinary catheters, and drains)   Encourage patient and family to use good hand hygiene technique     Problem: Pain  Goal: Pain at adequate level as identified by patient  Outcome: Progressing  Flowsheets (Taken 05/23/2021 0608 by Robert Bellow, RN)  Pain at adequate level as identified by patient:   Identify patient comfort function goal   Assess for risk of opioid induced respiratory depression, including snoring/sleep apnea. Alert healthcare team of risk factors identified.   Assess pain on admission, during daily assessment and/or before any "as needed" intervention(s)   Reassess pain within 30-60 minutes of any procedure/intervention, per Pain Assessment, Intervention, Reassessment (AIR) Cycle   Evaluate if patient comfort function goal is met   Offer non-pharmacological pain management interventions   Evaluate patient's satisfaction with pain management progress     Problem: Side Effects from Pain Analgesia  Goal: Patient will experience  minimal side effects of analgesic therapy  Outcome: Progressing  Flowsheets (Taken 05/23/2021 0608 by Robert Bellow, RN)  Patient will experience minimal side effects of analgesic therapy:   Monitor/assess patient's respiratory status (RR depth, effort, breath sounds)   Assess for changes in cognitive function   Prevent/manage side effects per LIP orders (i.e. nausea, vomiting, pruritus, constipation, urinary retention, etc.)   Evaluate for opioid-induced sedation with appropriate assessment tool (i.e. POSS)     Problem: Discharge Barriers  Goal: Patient will be discharged home or other facility with appropriate resources  Outcome: Progressing  Flowsheets (Taken 05/23/2021 2005)  Discharge to home or other facility with appropriate resources: Provide appropriate patient education     Problem: Psychosocial and Spiritual Needs  Goal: Demonstrates ability to cope with hospitalization/illness  Outcome: Progressing  Flowsheets (Taken 05/23/2021 2005)  Demonstrates ability to cope with hospitalizations/illness:   Encourage verbalization of feelings/concerns/expectations   Provide quiet environment     Problem: Inadequate Airway Clearance  Goal: Patent Airway maintained  Outcome: Progressing  Flowsheets (Taken 05/23/2021 0610 by Robert Bellow, RN)  Patent airway maintained:   Position patient for maximum ventilatory efficiency   Reinforce use of ordered respiratory interventions (i.e. CPAP, BiPAP, Incentive Spirometer, Acapella, etc.)   Reposition patient every 2 hours and as needed unless able to self-reposition     Problem: Infection Prevention  Goal: Free from infection  Outcome: Progressing  Flowsheets (Taken 05/23/2021 0610 by Robert Bellow, RN)  Free from infection:   Assess incision for evidence of healing   Encourage/assist patient to turn, cough and perform deep breathing  every 2 hours   Monitor/assess vital signs   Assess for signs and symptoms of infection

## 2021-05-23 NOTE — Significant Event (Signed)
Patient got restless and agitated ending pulling her NG tube. Did not want to put anything in his nose including oxygen tubing. No bleeding or respiratory distress noted. MD made aware and ordered to leave NG out at this time. Sitter initiated to prevent further pulling lines including prevena vac and foley. Patient able to slept and appears calm after NG tube out.

## 2021-05-24 LAB — CBC AND DIFFERENTIAL
Absolute NRBC: 0 10*3/uL (ref 0.00–0.00)
Basophils Absolute Automated: 0.02 10*3/uL (ref 0.00–0.08)
Basophils Automated: 0.1 %
Eosinophils Absolute Automated: 0 10*3/uL (ref 0.00–0.44)
Eosinophils Automated: 0 %
Hematocrit: 32.1 % — ABNORMAL LOW (ref 34.7–43.7)
Hgb: 9.6 g/dL — ABNORMAL LOW (ref 11.4–14.8)
Immature Granulocytes Absolute: 0.11 10*3/uL — ABNORMAL HIGH (ref 0.00–0.07)
Immature Granulocytes: 0.6 %
Lymphocytes Absolute Automated: 1.09 10*3/uL (ref 0.42–3.22)
Lymphocytes Automated: 6.1 %
MCH: 26.7 pg (ref 25.1–33.5)
MCHC: 29.9 g/dL — ABNORMAL LOW (ref 31.5–35.8)
MCV: 89.2 fL (ref 78.0–96.0)
MPV: 11.9 fL (ref 8.9–12.5)
Monocytes Absolute Automated: 0.91 10*3/uL — ABNORMAL HIGH (ref 0.21–0.85)
Monocytes: 5.1 %
Neutrophils Absolute: 15.79 10*3/uL — ABNORMAL HIGH (ref 1.10–6.33)
Neutrophils: 88.1 %
Nucleated RBC: 0 /100 WBC (ref 0.0–0.0)
Platelets: 276 10*3/uL (ref 142–346)
RBC: 3.6 10*6/uL — ABNORMAL LOW (ref 3.90–5.10)
RDW: 13 % (ref 11–15)
WBC: 17.92 10*3/uL — ABNORMAL HIGH (ref 3.10–9.50)

## 2021-05-24 LAB — BASIC METABOLIC PANEL
Anion Gap: 9 (ref 5.0–15.0)
BUN: 7 mg/dL (ref 7.0–19.0)
CO2: 26 mEq/L (ref 22–29)
Calcium: 8.4 mg/dL (ref 7.9–10.2)
Chloride: 106 mEq/L (ref 100–111)
Creatinine: 0.9 mg/dL (ref 0.6–1.0)
Glucose: 152 mg/dL — ABNORMAL HIGH (ref 70–100)
Potassium: 3.9 mEq/L (ref 3.5–5.1)
Sodium: 141 mEq/L (ref 136–145)

## 2021-05-24 LAB — GFR: EGFR: >60

## 2021-05-24 LAB — LACTIC ACID, PLASMA: Lactic Acid: 1 mmol/L (ref 0.2–2.0)

## 2021-05-24 NOTE — Progress Notes (Signed)
POST OPERATIVE PROGRESS NOTE  [facility] Spectralink: [id number]    Date Time: 05/24/21 9:24 AM  Patient Name: Emily [name]      ASSESSMENT:   2 Days Post-Op S/P Procedure(s):  LAPAROSCOPY, DIAGNOSTIC  EXPLORATORY LAPAROTOMY  LAPAROTOMY, COLECTOMY, RIGHT  Reactive leukocytosis  WBC 17.9, Lactic acid 1.0   PLAN:   -Await bowel function prior to advancing diet. Maintain NPO, IVF  -Remove foley catheter   -Encourage ambulation, PT/OT, IS  -Pain control  -Continue abx  -Plan discussed and agreed upon by Dr. Allena Katz. Discussed w/ RN and primary team.    SUBJECTIVE:   The patient is doing fairly well.  Post operative pain is well controlled with medications.  Current dietary status:  Diet NPO effective now Except for: SIPS WITH MEDS and tolerating well.  Flatus: no. BM:  no.  Additional complaints: none       OBJECTIVE:   Current Vitals:   Vitals:    05/24/21 0826   BP: 119/69   Pulse: 75   Resp: 17   Temp: 98.2 F (36.8 C)   SpO2: 96%       Intake and Output Summary (Last 24 hours):  I/O last 3 completed shifts:  In: 3043.33 [P.O.:150; I.V.:1643.33; IV Piggyback:1250]  Out: 4325 [Urine:4275; Blood:50]    Labs:     Results       Procedure Component Value Units Date/Time    Basic Metabolic Panel [[id number]]  (Abnormal) Collected: 05/24/21 0515    Specimen: Blood Updated: 05/24/21 0642     Glucose 152 mg/dL      BUN 7.0 mg/dL      Creatinine 0.9 mg/dL      Calcium 8.4 mg/dL      Sodium 409 mEq/L      Potassium 3.9 mEq/L      Chloride 106 mEq/L      CO2 26 mEq/L      Anion Gap 9.0    GFR [[id number]] Collected: 05/24/21 0515     Updated: 05/24/21 0642     EGFR >60.0    CBC and differential [[id number]]  (Abnormal) Collected: 05/24/21 0515    Specimen: Blood Updated: 05/24/21 0625     WBC 17.92 x10 3/uL      Hgb 9.6 g/dL      Hematocrit 57.8 %      Platelets 276 x10 3/uL      RBC 3.60 x10 6/uL      MCV 89.2 fL      MCH 26.7 pg      MCHC 29.9 g/dL      RDW 13 %      MPV 11.9 fL      Neutrophils 88.1 %      Lymphocytes Automated 6.1 %       Monocytes 5.1 %      Eosinophils Automated 0.0 %      Basophils Automated 0.1 %      Immature Granulocytes 0.6 %      Nucleated RBC 0.0 /100 WBC      Neutrophils Absolute 15.79 x10 3/uL      Lymphocytes Absolute Automated 1.09 x10 3/uL      Monocytes Absolute Automated 0.91 x10 3/uL      Eosinophils Absolute Automated 0.00 x10 3/uL      Basophils Absolute Automated 0.02 x10 3/uL      Immature Granulocytes Absolute 0.11 x10 3/uL      Absolute NRBC 0.00 x10 3/uL     Culture Blood Aerobic and  Anaerobic [[id number]] Collected: 05/22/21 1935    Specimen: Arm from Blood, Venipuncture Updated: 05/24/21 0121    Narrative:      ORDER#: [id number]                                    ORDERED BY: Macky Lower  SOURCE: Blood, Venipuncture arm                      COLLECTED:  05/22/21 19:35  ANTIBIOTICS AT COLL.:                                RECEIVED :  05/23/21 00:58  Culture Blood Aerobic and Anaerobic        PRELIM      05/24/21 01:21  05/24/21   No Growth after 1 day/s of incubation.      Culture Blood Aerobic and Anaerobic [[id number]] Collected: 05/22/21 2005    Specimen: Blood, Venipuncture Updated: 05/24/21 0121    Narrative:      ORDER#: [id number]                                    ORDERED BY: Macky Lower  SOURCE: Blood, Venipuncture                          COLLECTED:  05/22/21 20:05  ANTIBIOTICS AT COLL.:                                RECEIVED :  05/23/21 00:58  Culture Blood Aerobic and Anaerobic        PRELIM      05/24/21 01:21  05/24/21   No Growth after 1 day/s of incubation.      Lactic Acid [[id number]] Collected: 05/23/21 1252    Specimen: Blood Updated: 05/23/21 1307     Lactic Acid 1.9 mmol/L     Basic Metabolic Panel [[id number]]  (Abnormal) Collected: 05/23/21 0731    Specimen: Blood Updated: 05/23/21 1043     Glucose 188 mg/dL      BUN 53.6 mg/dL      Creatinine 1.0 mg/dL      Calcium 8.9 mg/dL      Sodium 644 mEq/L      Potassium 4.6 mEq/L      Chloride 107 mEq/L      CO2 23 mEq/L      Anion Gap 12.0     GFR [[id number]] Collected: 05/23/21 0731     Updated: 05/23/21 1043     EGFR >60.0    CBC and differential [[id number]]  (Abnormal) Collected: 05/23/21 0731    Specimen: Blood Updated: 05/23/21 1039     WBC 20.59 x10 3/uL      Hgb 10.9 g/dL      Hematocrit 29.5 %      Platelets 335 x10 3/uL      RBC 4.14 x10 6/uL      MCV 88.6 fL      MCH 26.3 pg      MCHC 29.7 g/dL      RDW 13 %      MPV 12.0 fL      Neutrophils 93.4 %  Lymphocytes Automated 2.7 %      Monocytes 3.4 %      Eosinophils Automated 0.0 %      Basophils Automated 0.1 %      Immature Granulocytes 0.4 %      Nucleated RBC 0.0 /100 WBC      Neutrophils Absolute 19.23 x10 3/uL      Lymphocytes Absolute Automated 0.55 x10 3/uL      Monocytes Absolute Automated 0.71 x10 3/uL      Eosinophils Absolute Automated 0.00 x10 3/uL      Basophils Absolute Automated 0.02 x10 3/uL      Immature Granulocytes Absolute 0.08 x10 3/uL      Absolute NRBC 0.00 x10 3/uL             Rads:     Radiology Results (24 Hour)       ** No results found for the last 24 hours. **            Physical Exam:     Mental status - alert, oriented to person, place, and time, normal mood, behavior, speech, dress, motor activity, and thought processes  Chest - no tachypnea, retractions or cyanosis  Heart - normal rate and regular rhythm  Abdomen - soft, appropriately tender, nondistended, no masses or organomegaly  Wound - clean, dry, no drainage, prevena vac in place  Extremities - no pedal edema noted      Signed by:   Joesph July  Speciality Physician Assistant  [phone]

## 2021-05-24 NOTE — Progress Notes (Addendum)
MEDICINE PROGRESS NOTE  [facility], DIVISION OF HOSPITALIST MEDICINE   [facility]   [place] Pager: [phone]  Patient: Emily [name]   MRN: [id number]     Active Hospital Problems    Diagnosis    Ischemic colon    History of colonic polyps    AKI (acute kidney injury)    Acute colitis    Mixed hyperlipidemia    Hypertension        A 75 y.o. female with PMH of breast cancer status post right breast mastectomy, GERD, hypertension, hypothyroidism who presented with acute abdominal pain and was found to have right-sided colitis.     Assessment /Plan  Acute ischemic colitis  Status post exploratory laparotomy and right hemicolectomy 05/23/2021  Patient removed NG tube  keep patient n.p.o.   Continue Zosyn  Follow-up blood culture   Continue IV fluids  Surgery follow-up, Continue wound VAC    Anemia of chronic disease  Monitor CBC    Deconditioning due to generalized weakness  Activity as tolerated  Consult PT and OT recommended skilled nursing facility    Essential hypertension  Continue amlodipine and lisinopril     Hypothyroidism  Continue Synthroid     Hyperlipidemia  Continue Crestor    acetaminophen, 650 mg, Q6H  amLODIPine, 10 mg, Daily  docusate sodium, 200 mg, Daily  gabapentin, 100 mg, Q8H [facility]  heparin (porcine), 5,000 Units, Q8H [facility]  heparin (porcine), 5,000 Units, Pre-Op  levothyroxine, 25 mcg, QAM  lidocaine, 1 patch, Q24H  lisinopril, 40 mg, Daily  piperacillin-tazobactam, 4.5 g, Q8H  rosuvastatin, 20 mg, Daily      glucagon (rDNA), 1 mg, PRN   And  dextrose, 250 mL, PRN   And  dextrose, 25 g, PRN   And  dextrose, 25 g, PRN  traMADol, 50 mg, Q6H PRN   Or  HYDROmorphone, 0.4 mg, Q6H PRN  magnesium sulfate, 1 g, PRN  meclizine, 25 mg, TID PRN  melatonin, 3 mg, QHS PRN  naloxone, 0.2 mg, PRN  potassium & sodium phosphates, 2 packet, PRN  potassium chloride, 0-40 mEq, PRN   And  potassium chloride, 10 mEq, PRN       dextrose 5 % and 0.45% NaCl 100 mL/hr at 05/24/21 1528        Diagnoses,  management and prognosis has been discussed with the patient.  I have spent 30 minutes in direct patient care and coordination of treatment; including reviewing chart (including old records), labs, Vitals, placing orders, d/w attending RN, ICM, and charting extensive complicated medical hx etc.     Subjective/ROS:   Abdominal pain is controlled, complaining of mild nausea but no vomiting, no bowel movement, All other systems were reviewed and were negative.     Physical Exam:  Temp:  [97.5 F (36.4 C)-99.3 F (37.4 C)] 97.5 F (36.4 C)  Heart Rate:  [65-85] 65  Resp Rate:  [17-20] 18  BP: (101-127)/(53-73) 117/67  Intake and Output Summary-last 24 Hrs:  I/O last 3 completed shifts:  In: 3043.33 [P.O.:150; I.V.:1643.33; IV Piggyback:1250]  Out: 4325 [Urine:4275; Blood:50]  O2 Flow Rate (L/min)  Avg: 2 L/min  Min: 2 L/min  Max: 2 L/min by O2 Device: None (Room air)  General: Chronic ill appearance, not in distress.  HEENT: Normocephalic, sclera anicteric, pallor.  Neck: Supple, No JVD.  Respiratory: Clear bilaterally  Cardiovascular: S1 S2, RRR.  Abdominal: Soft, wound VAC in place, mildly tender, no rebound or rigidity, BS positive.  Extremities: No cyanosis or edema.  Neurological: A, O x3, No focal deficit.  Psychiatric: Behavior is appropriate, mood normal, no suicidal ideation     I have reviewed the following pertinent result(s), including: all available   No results found.  No results for input(s): GLUCOSEWB in the last 24 hours.  Recent Labs   Lab 05/24/21  0515 05/23/21  0731 05/22/21  1935   Sodium 141 142 140   Potassium 3.9 4.6 4.3   Chloride 106 107 103   BUN 7.0 12.0 18.0   Creatinine 0.9 1.0 1.4*   EGFR >60.0 >60.0 44.2   Glucose 152* 188* 152*   Calcium 8.4 8.9 9.5     Recent Labs   Lab 05/24/21  0515 05/23/21  0731 05/22/21  1935   WBC 17.92* 20.59* 14.81*   Hgb 9.6* 10.9* 10.6*   Hematocrit 32.1* 36.7 33.3*   Platelets 276 335 324     Recent Labs   Lab 05/22/21  2143   PT 12.1   PT INR 1.1      Recent Labs   Lab 05/22/21  1935   Alkaline Phosphatase 80   Bilirubin, Total 0.3   ALT 11   AST (SGOT) 23           Argentina Ponder, MD, FACP     This note was generated by the Epic EMR system/ Dragon speech recognition and may contain inherent errors or omissions not intended by the user. Grammatical errors, random word insertions, deletions, pronoun errors and incomplete sentences are occasional consequences of this technology due to software limitations. Not all errors are caught or corrected. If there are questions or concerns about the content of this note or information contained within the body of this dictation they should be addressed directly with the author for clarification.

## 2021-05-24 NOTE — Plan of Care (Signed)
Problem: Safety  Goal: Patient will be free from injury during hospitalization  Outcome: Progressing  Flowsheets (Taken 05/24/2021 1623)  Patient will be free from injury during hospitalization:   Assess patient's risk for falls and implement fall prevention plan of care per policy   Provide and maintain safe environment   Ensure appropriate safety devices are available at the bedside   Hourly rounding   Assess for patients risk for elopement and implement Elopement Risk Plan per policy   Provide alternative method of communication if needed (communication boards, writing)  Goal: Patient will be free from infection during hospitalization  Outcome: Progressing  Flowsheets (Taken 05/24/2021 1623)  Free from Infection during hospitalization:   Assess and monitor for signs and symptoms of infection   Monitor all insertion sites (i.e. indwelling lines, tubes, urinary catheters, and drains)   Monitor lab/diagnostic results     Problem: Pain  Goal: Pain at adequate level as identified by patient  Outcome: Progressing  Flowsheets (Taken 05/24/2021 1623)  Pain at adequate level as identified by patient:   Identify patient comfort function goal   Reassess pain within 30-60 minutes of any procedure/intervention, per Pain Assessment, Intervention, Reassessment (AIR) Cycle   Evaluate if patient comfort function goal is met     Problem: Side Effects from Pain Analgesia  Goal: Patient will experience minimal side effects of analgesic therapy  Outcome: Progressing  Flowsheets (Taken 05/24/2021 1623)  Patient will experience minimal side effects of analgesic therapy:   Monitor/assess patient's respiratory status (RR depth, effort, breath sounds)   Prevent/manage side effects per LIP orders (i.e. nausea, vomiting, pruritus, constipation, urinary retention, etc.)     Problem: Discharge Barriers  Goal: Patient will be discharged home or other facility with appropriate resources  Outcome: Progressing  Flowsheets (Taken 05/24/2021  1623)  Discharge to home or other facility with appropriate resources: Provide appropriate patient education     Problem: Psychosocial and Spiritual Needs  Goal: Demonstrates ability to cope with hospitalization/illness  Outcome: Progressing  Flowsheets (Taken 05/24/2021 1623)  Demonstrates ability to cope with hospitalizations/illness:   Encourage verbalization of feelings/concerns/expectations   Provide quiet environment   Include patient/ patient care companion in decisions     Problem: Inadequate Airway Clearance  Goal: Patent Airway maintained  Outcome: Progressing  Flowsheets (Taken 05/24/2021 1623)  Patent airway maintained:   Position patient for maximum ventilatory efficiency   Reinforce use of ordered respiratory interventions (i.e. CPAP, BiPAP, Incentive Spirometer, Acapella, etc.)     Problem: Infection Prevention  Goal: Free from infection  Outcome: Progressing  Flowsheets (Taken 05/24/2021 1623)  Free from infection:   Monitor/assess vital signs   Encourage/assist patient to turn, cough and perform deep breathing every 2 hours   Assess for signs and symptoms of infection     Problem: Constipation  Goal: Fluid and electrolyte balance are achieved/maintained  Outcome: Progressing  Flowsheets (Taken 05/24/2021 1623)  Fluid and electrolyte balance are achieved/maintained:   Monitor intake and output every shift   Monitor/assess lab values and report abnormal values   Assess for confusion/personality changes   Assess and reassess fluid and electrolyte status     Problem: Moderate/High Fall Risk Score >5  Goal: Patient will remain free of falls  Outcome: Progressing  Flowsheets (Taken 05/24/2021 1012)  High (Greater than 13):   LOW-Fall Interventions Appropriate for Low Fall Risk   HIGH-Consider use of low bed   MOD-Place Fall Risk level on whiteboard in room   HIGH-Bed alarm on at  all times while patient in bed   MOD-Use of assistive devices -Bedside Commode if appropriate   MOD-Utilize diversion activities    MOD-Re-orient confused patients     Problem: Compromised Tissue integrity  Goal: Damaged tissue is healing and protected  Outcome: Progressing  Flowsheets (Taken 05/24/2021 1623)  Damaged tissue is healing and protected:   Monitor/assess Braden scale every shift   Reposition patient every 2 hours and as needed unless able to reposition self   Keep intact skin clean and dry  Goal: Nutritional status is improving  Outcome: Progressing  Flowsheets (Taken 05/24/2021 1623)  Nutritional status is improving:   Allow adequate time for meals   Assist patient with eating

## 2021-05-24 NOTE — Plan of Care (Signed)
Problem: Safety  Goal: Patient will be free from injury during hospitalization  Outcome: Progressing  Flowsheets (Taken 05/24/2021 0348)  Patient will be free from injury during hospitalization:   Assess patient's risk for falls and implement fall prevention plan of care per policy   Provide and maintain safe environment   Use appropriate transfer methods   Ensure appropriate safety devices are available at the bedside   Include patient/ family/ care giver in decisions related to safety   Hourly rounding  Goal: Patient will be free from infection during hospitalization  Outcome: Progressing  Flowsheets (Taken 05/24/2021 0348)  Free from Infection during hospitalization:   Assess and monitor for signs and symptoms of infection   Monitor all insertion sites (i.e. indwelling lines, tubes, urinary catheters, and drains)   Encourage patient and family to use good hand hygiene technique     Problem: Pain  Goal: Pain at adequate level as identified by patient  Outcome: Progressing  Flowsheets (Taken 05/24/2021 0348)  Pain at adequate level as identified by patient:   Identify patient comfort function goal   Assess for risk of opioid induced respiratory depression, including snoring/sleep apnea. Alert healthcare team of risk factors identified.   Evaluate if patient comfort function goal is met   Reassess pain within 30-60 minutes of any procedure/intervention, per Pain Assessment, Intervention, Reassessment (AIR) Cycle   Assess pain on admission, during daily assessment and/or before any "as needed" intervention(s)   Evaluate patient's satisfaction with pain management progress   Offer non-pharmacological pain management interventions     Problem: Side Effects from Pain Analgesia  Goal: Patient will experience minimal side effects of analgesic therapy  Outcome: Progressing  Flowsheets (Taken 05/24/2021 0348)  Patient will experience minimal side effects of analgesic therapy:   Monitor/assess patient's respiratory status (RR  depth, effort, breath sounds)   Assess for changes in cognitive function   Prevent/manage side effects per LIP orders (i.e. nausea, vomiting, pruritus, constipation, urinary retention, etc.)   Evaluate for opioid-induced sedation with appropriate assessment tool (i.e. POSS)

## 2021-05-24 NOTE — PT Eval Note (Signed)
[facility]  [address]  [place], [place] [address]  [phone]    Physical Therapy Evaluation    Patient: Emily [name] MRN: [id number]   Unit: [facility] 4C Bed: A213/Y865.78    Time of Treatment: Time Calculation  PT Received On: 05/24/21  Start Time: 4696  Stop Time: 0919  Time Calculation (min): 26 min    Consult received for Emily [name] for PT evaluation and treatment.  Patient's medical condition is appropriate for Physical Therapy  intervention at this time.    Interpreter utilized: no, not indicated      D/C Suggestions   Based on today's performance:  Discharge Recommendation: SNF   DME Recommended for Discharge: Front wheel walker     If recommended d/c disposition is not available, patient will need Home w/ 24/7 superv, HHPT, HHA  Transport Recommendations: wheelchair van/transport    Discharge recommendations are based on patient's progression/regression. Please see most recent note for updated discharge recommendations.    Assessment   Emily [name] is a 75 y.o. female admitted 05/22/2021 sudden onset abdominal pain, nausea and vomiting. CT scan revealed severe colitis on R side extending to the transverse colon. Pt underwent a diagnostic laparoscopy, exploratory laparotomy and R colectomy on 8/20.   Pt in bed upon arrival w/ daughter at bedside. Pt daughter gave PLOF due to pt being droswy. Pt daughter reports that the pt lives alone in an apartment w/ 10 STE. Prior to admission, pt was independent w/ ambulation, all ADLS and was driving. Today, pt required minA to transfer supine to sit via log roll, minA to stand w/ RW and CGA w/ RW to take steps at bedside. Pt returned to supine maintaining abdominal precautions w/ minA. Pt is functioning below baseline and would benefit from skilled PT during her stay. D/c recommendation SNF when medically stable.       Complexity Level Hx and Co  morbidites Examination Clinical Decision Making Clinical Presentation    Low  no impact 1-2 elements Limited options Stable       PMP - Progressive Mobility Protocol   PMP Activity: Step 6 - Walks in Room  Distance Walked (ft) (Step 6,7): 2 Feet       Rehabilitation Potential: good       Interdisciplinary Communication: RN      Plan       Plan  Risks/Benefits/POC Discussed with Pt/Family: With patient  Treatment/Interventions: Exercise;Gait training;Stair training;Functional transfer training;LE strengthening/ROM;Endurance training  PT Frequency: 4-5x/wk         Medical Diagnosis: Small bowel ischemia [K55.9]  Right lower quadrant abdominal pain [R10.31]    History of Present Illness: Emily [name] is a 75 y.o. female admitted on  05/22/2021 with " with sudden onset of abdominal pain right side yesterday that waxed and waned and then got severely worse today bringing her to ED. She had a colonoscopy 07/2020 with only sigmoid polyp and evidence of the prior colitis seen 03/2020 on ct scan.  Ct scan today in comparison is severe colitis on right side extending to the transverse colon. H/o of similar right sided colitis though less severe on review of ct scans 03/2020-- follow up scope was normal."        Patient Active Problem List   Diagnosis    Malignant neoplasm of overlapping sites of right female breast    Hypertension    Mixed hyperlipidemia    Hypothyroid    Right  sided numbness    Chest discomfort    SOB (shortness of breath)    Left sided numbness    Left-sided weakness    COVID-19 ruled out by laboratory testing    Acute colitis    Dizziness    Right lower quadrant abdominal pain    Abnormal CT scan, gastrointestinal tract    History of hypothyroidism    History of breast cancer    Altered mental status    Hypertensive emergency    Ischemic colon    History of colonic polyps    AKI (acute kidney injury)       Past Medical History:   Diagnosis Date    Breast cancer 2014    right breast mastectomy    Exercise-induced angina     negative work up    GERD (gastroesophageal reflux  disease)     Headache     resolved    Hyperlipidemia     Hypertension     Hypothyroidism     Low back pain     Malignant neoplasm of overlapping sites of right female breast 10/19/2015    Syncope and collapse     Chronic Vertigo    Vertigo        Past Surgical History:   Procedure Laterality Date    COLONOSCOPY  2021    COLONOSCOPY, POLYPECTOMY N/A 07/21/2020    Procedure: COLONOSCOPY, POLYPECTOMY;  Surgeon: Einar Grad, MD;  Location: [facility];  Service: Gastroenterology;  Laterality: N/A;    EGD, BIOPSY N/A 07/21/2020    Procedure: EGD, BIOPSY;  Surgeon: Einar Grad, MD;  Location: [facility];  Service: Gastroenterology;  Laterality: N/A;    HYSTERECTOMY      MASTECTOMY Right 2015       Tests/Labs:  Lab Results   Component Value Date/Time    HGB 9.6 (L) 05/24/2021 05:15 AM    HCT 32.1 (L) 05/24/2021 05:15 AM    K 3.9 05/24/2021 05:15 AM    NA 141 05/24/2021 05:15 AM    INR 1.1 05/22/2021 09:43 PM    TROPI <0.01 05/22/2021 07:35 PM    TROPI 0.02 04/21/2021 11:11 AM    TROPI <0.01 03/08/2021 10:15 PM    TROPI <0.01 01/03/2021 02:01 PM    TROPI 0.01 07/15/2009 09:40 AM         Imaging   CT Abd/ Pelvis with IV Contrast    Result Date: 05/22/2021  1.  There is increased wall thickening of the cecum and ascending colon with hypoenhancement. A new small focus of gas is seen in the mesenteric draining vein with new tiny foci of gas in the left hepatic lobe that are favored to reflect portal venous gas. Findings are concerning for bowel ischemia. 2.  Faint areas of hypoenhancement in the kidneys bilaterally can be seen with pyelonephritis, recommend correlation with urinalysis. 3.  Subcentimeter pulmonary nodules measuring up to 3 mm. Follow-up should be obtained based on Fleischner guidelines. 4.  The appendix is not visualized. 5.  Ancillary findings as described above. Findings were communicated to Dr. Delphina Cahill at 05/22/2021 9:00 PM. Ewing Schlein MD, MD  05/22/2021 9:05 PM     Social History: (Per family/care provider, if  patient is not able to give accurate report)  Lives alone in a apartment.  Entry Steps: 10   Rails: y Inside steps: 0  Rails: 0  Equipment at home:  No equipment needs  Prior Level of Function:  independent w/ ambulation and  ADLS   Cognition: good    Mobility/Locomotion: independent   Feeding: independent   Grooming: independent   Bathing: independent   Dressing: independent   Toileting: independent     Subjective   Patient is agreeable to participation in the therapy session. Nursing clears patient for therapy.  Patient's Goal:  none stated   Pain: Pt unable to quantify  Location: abdominal incision  Therapist Intervention: positioned for comfort and RN notified of pt's request for pain medication  Patient is satisfied with therapist intervention.    Objective     Precautions/ Contraindications:   Precautions  Weight Bearing Status: no restrictions  Other Precautions: abdominal precautions, falls, foley, wound vac    Patient is in bed with  Telemetry, Foley, Intravenous (IV), Sequential Compression Device (SCD), Bed Alarm, Blood Pressure Cuff, and Wound Vac in place.       Observation of patient/vitals:        Vitals:    05/23/21 2208 05/23/21 2349 05/24/21 0446 05/24/21 0826   BP:  113/68 101/53 119/69   Pulse: 73 85 80 75   Resp:  18 20 17    Temp:  99.3 F (37.4 C) 99 F (37.2 C) 98.2 F (36.8 C)   TempSrc:  Oral Oral Oral   SpO2: 98% 100% 92% 96%   Weight:       Height:           Orientation/Cognition:  Alert and Oriented x 4  Cognition: good      Musculoskeletal Examination:      ROM Strength   RLE WFL 4/5   LLE WFL 4/5     Sensation: intact  Coordination: fair    Functional Mobility:  Rolling: superv   Supine to sit: minA  Sit to Supine: minA  Sit to stand: minA  Stand to sit: SBA    Ambulation:     Weightbearing: no restricitons   Assistance level: CGA   Distance: 2'   Assistive Device: RW   Gait Deviations: decreased gait speed and step length       Balance:  Static Sit: fair  Dynamic Sit: fair  Static  Stand: fair  Dynamic Stand: fair    Endurance: fair    Participation:  good    Education:  Educated the patient to role of physical therapy, plan of care, goals  of therapy, rationale for progressing mobility and safety with mobility and ADLs, energy conservation techniques, and home safety.    RN notified of session outcome and that patient was left in bed with all needs met and equipment intact.   Safety measures include: handoff to nurse/clin tech/ unit secretary completed, bed alarm activated, oriented to call bell and placed within reach, personal items within reach, assistive device positioned out of reach, and bed placed in lowest position.   Mobility and ADL status posted at bedside and within E.M.R.               Goals  Goal Formulation: With patient  Time for Goal Acheivement: 5 visits  Pt Will Go Supine To Sit: with supervision  Pt Will Perform Sit To Supine: with supervision  Pt Will Perform Sit to Stand: with supervision  Pt Will Ambulate: > 200 feet;with supervision  Pt Will Go Up / Down Stairs: 6-10 stairs;with supervision                    Therapist PPE during session procedural mask and gloves     Signature:  Lennox Pippins, PT  05/24/2021  9:26 AM  Phone: [phone]     (For scheduling questions, please contact rehab tech [phone])

## 2021-05-24 NOTE — Plan of Care (Addendum)
Pt lives alone  and wants to discharge with H. H. and does not want to go to SNF.  Spoke to son Emily [name] [phone] , if home alone not an option, she will d/c with family in [place] . Discussed about transportation cost  if not covered by her insurance, the family will be responsible. Will update son following therapy recommendation.        Situation   Admission DX:   1. Small bowel ischemia    2. Right lower quadrant abdominal pain    3. Ischemia, bowel        A/O Status: X 3      LACE Score: 11    Patient admitted from: ER  Admission Status: inpatient    Health Care Agent: Child  Name: Emily [name]  Phone number: [phone]       Background     Advanced directive:   <no information>    Code Status:   Full Code     Residence: Apartment    PCP: Emily Baton, MD  Patient Contact:   [phone] (home)     [phone] (mobile)     Emergency contact:   Extended Emergency Contact Information  Primary Emergency Contact: Emily [name],Emily [name]  Mobile Phone: [phone]  Relation: Son  Preferred language: English  Interpreter needed? No  Secondary Emergency Contact: Emily [name],Emily [name]  Mobile Phone: [phone]  Relation: Cousin      ADL/IADL's: Independent  Previous Level of function: 7 Independent     DME: None    Pharmacy:     [facility] [address], [place] - [address]  [address]  [place] [address]  Phone: [phone] Fax: [phone]      Prescription Coverage: Yes    Home Health: The patient is not currently receiving home health services.    Previous SNF/AR: No    COVID Vaccine Status: Vaccinated    Date First IMM given: No  UAI on file?: No  Transport for discharge? Mode of transportation: Sales executive - Family/Friend to drive patient  Agreeable to Home Health Services post-discharge:  Yes     Assessment   Assessment completed at bedside/over the phone with patient/spouse/daughter/son.    BARRIERS TO DISCHARGE: None     Recommendation   D/C Plan A: Home with home health    D/C Plan B:  Home with home health    D/C Plan C: Home with home health

## 2021-05-25 LAB — CBC AND DIFFERENTIAL
Absolute NRBC: 0 10*3/uL (ref 0.00–0.00)
Basophils Absolute Automated: 0.02 10*3/uL (ref 0.00–0.08)
Basophils Automated: 0.1 %
Eosinophils Absolute Automated: 0 10*3/uL (ref 0.00–0.44)
Eosinophils Automated: 0 %
Hematocrit: 31.2 % — ABNORMAL LOW (ref 34.7–43.7)
Hgb: 9.4 g/dL — ABNORMAL LOW (ref 11.4–14.8)
Immature Granulocytes Absolute: 0.12 10*3/uL — ABNORMAL HIGH (ref 0.00–0.07)
Immature Granulocytes: 0.6 %
Lymphocytes Absolute Automated: 1.66 10*3/uL (ref 0.42–3.22)
Lymphocytes Automated: 8.7 %
MCH: 26.9 pg (ref 25.1–33.5)
MCHC: 30.1 g/dL — ABNORMAL LOW (ref 31.5–35.8)
MCV: 89.1 fL (ref 78.0–96.0)
MPV: 11.7 fL (ref 8.9–12.5)
Monocytes Absolute Automated: 1.28 10*3/uL — ABNORMAL HIGH (ref 0.21–0.85)
Monocytes: 6.7 %
Neutrophils Absolute: 15.96 10*3/uL — ABNORMAL HIGH (ref 1.10–6.33)
Neutrophils: 83.9 %
Nucleated RBC: 0 /100 WBC (ref 0.0–0.0)
Platelets: 266 10*3/uL (ref 142–346)
RBC: 3.5 10*6/uL — ABNORMAL LOW (ref 3.90–5.10)
RDW: 13 % (ref 11–15)
WBC: 19.04 10*3/uL — ABNORMAL HIGH (ref 3.10–9.50)

## 2021-05-25 LAB — BASIC METABOLIC PANEL
Anion Gap: 11 (ref 5.0–15.0)
BUN: 7 mg/dL (ref 7.0–19.0)
CO2: 25 mEq/L (ref 22–29)
Calcium: 8.1 mg/dL (ref 7.9–10.2)
Chloride: 105 mEq/L (ref 100–111)
Creatinine: 0.8 mg/dL (ref 0.6–1.0)
Glucose: 100 mg/dL (ref 70–100)
Potassium: 3.7 mEq/L (ref 3.5–5.1)
Sodium: 141 mEq/L (ref 136–145)

## 2021-05-25 LAB — ECG 12-LEAD
Atrial Rate: 71 {beats}/min
IHS MUSE NARRATIVE AND IMPRESSION: NORMAL
P Axis: 54 degrees
P-R Interval: 200 ms
Q-T Interval: 390 ms
QRS Duration: 92 ms
QTC Calculation (Bezet): 423 ms
R Axis: 42 degrees
T Axis: 46 degrees
Ventricular Rate: 71 {beats}/min

## 2021-05-25 LAB — GFR: EGFR: >60

## 2021-05-25 LAB — LAB USE ONLY - HISTORICAL SURGICAL PATHOLOGY

## 2021-05-25 MED ORDER — ACETAMINOPHEN 325 MG PO TABS
650.0000 mg | ORAL_TABLET | Freq: Four times a day (QID) | ORAL | Status: DC
Start: 2021-05-25 — End: 2021-06-01
  Administered 2021-05-25 – 2021-06-01 (×30): 650 mg via ORAL
  Filled 2021-05-25 (×30): qty 2

## 2021-05-25 NOTE — Progress Notes (Signed)
POST OPERATIVE PROGRESS NOTE  [facility] Spectralink: [id number]    Date Time: 05/25/21 9:36 AM  Patient Name: Emily [name],Emily [name]      ASSESSMENT:   3 Days Post-Op S/P Procedure(s):  LAPAROSCOPY, DIAGNOSTIC  EXPLORATORY LAPAROTOMY  LAPAROTOMY, COLECTOMY, RIGHT  Reactive leukocytosis  Tmax 100.8, WBC 19k    PLAN:   -Await bowel function prior to advancing diet. Maintain NPO, IVF  -Encourage ambulation, PT/OT, IS  -Pain control  -Continue abx  -Plan discussed and agreed upon by Dr. Joanna Puff.    SUBJECTIVE:   The patient is doing fairly well.  Post operative pain is well controlled with medications.  Current dietary status:  Diet NPO effective now Except for: SIPS WITH MEDS and tolerating well.  Flatus: no. BM:  no.  Additional complaints: none       OBJECTIVE:   Current Vitals:   Vitals:    05/25/21 0919   BP: 132/73   Pulse:    Resp:    Temp:    SpO2:        Intake and Output Summary (Last 24 hours):  I/O last 3 completed shifts:  In: -   Out: 2750 [Urine:2750]    Labs:     Results       Procedure Component Value Units Date/Time    Basic Metabolic Panel [[id number]] Collected: 05/25/21 0508    Specimen: Blood Updated: 05/25/21 0624     Glucose 100 mg/dL      BUN 7.0 mg/dL      Creatinine 0.8 mg/dL      Calcium 8.1 mg/dL      Sodium 409 mEq/L      Potassium 3.7 mEq/L      Chloride 105 mEq/L      CO2 25 mEq/L      Anion Gap 11.0    GFR [[id number]] Collected: 05/25/21 0508     Updated: 05/25/21 0624     EGFR >60.0    CBC and differential [[id number]]  (Abnormal) Collected: 05/25/21 0508    Specimen: Blood Updated: 05/25/21 0603     WBC 19.04 x10 3/uL      Hgb 9.4 g/dL      Hematocrit 57.8 %      Platelets 266 x10 3/uL      RBC 3.50 x10 6/uL      MCV 89.1 fL      MCH 26.9 pg      MCHC 30.1 g/dL      RDW 13 %      MPV 11.7 fL      Neutrophils 83.9 %      Lymphocytes Automated 8.7 %      Monocytes 6.7 %      Eosinophils Automated 0.0 %      Basophils Automated 0.1 %      Immature Granulocytes 0.6 %      Nucleated RBC 0.0 /100 WBC       Neutrophils Absolute 15.96 x10 3/uL      Lymphocytes Absolute Automated 1.66 x10 3/uL      Monocytes Absolute Automated 1.28 x10 3/uL      Eosinophils Absolute Automated 0.00 x10 3/uL      Basophils Absolute Automated 0.02 x10 3/uL      Immature Granulocytes Absolute 0.12 x10 3/uL      Absolute NRBC 0.00 x10 3/uL     Culture Blood Aerobic and Anaerobic [[id number]] Collected: 05/22/21 2005    Specimen: Blood, Venipuncture Updated: 05/25/21 0121    Narrative:  ORDER#: [id number]                                    ORDERED BY: Macky Lower  SOURCE: Blood, Venipuncture                          COLLECTED:  05/22/21 20:05  ANTIBIOTICS AT COLL.:                                RECEIVED :  05/23/21 00:58  Culture Blood Aerobic and Anaerobic        PRELIM      05/25/21 01:21  05/24/21   No Growth after 1 day/s of incubation.  05/25/21   No Growth after 2 day/s of incubation.      Culture Blood Aerobic and Anaerobic [[id number]] Collected: 05/22/21 1935    Specimen: Arm from Blood, Venipuncture Updated: 05/25/21 0121    Narrative:      ORDER#: [id number]                                    ORDERED BY: Macky Lower  SOURCE: Blood, Venipuncture arm                      COLLECTED:  05/22/21 19:35  ANTIBIOTICS AT COLL.:                                RECEIVED :  05/23/21 00:58  Culture Blood Aerobic and Anaerobic        PRELIM      05/25/21 01:21  05/24/21   No Growth after 1 day/s of incubation.  05/25/21   No Growth after 2 day/s of incubation.      Lactic Acid [[id number]] Collected: 05/24/21 1053    Specimen: Blood Updated: 05/24/21 1109     Lactic Acid 1.0 mmol/L             Rads:     Radiology Results (24 Hour)       ** No results found for the last 24 hours. **            Physical Exam:     Mental status - alert, oriented to person, place, and time, normal mood, behavior, speech, dress, motor activity, and thought processes  Chest - no tachypnea, retractions or cyanosis  Heart - normal rate and regular rhythm  Abdomen -  soft, non tender, nondistended, no masses or organomegaly  Wound - clean, dry, no drainage, prevena vac in place  Extremities - no pedal edema noted      Signed by:   Joesph July  Speciality Physician Assistant  [phone]

## 2021-05-25 NOTE — PT Progress Note (Signed)
Physical Therapy Note    [name]  [address]  [place] [address]  [phone]    Physical Therapy Treatment    Patient:  Emily [name]        MRN#:  [id number]  Unit:  [facility] 4C        Room/Bed:  A213/Y865.78    Medical Diagnosis: Small bowel ischemia [K55.9]  Right lower quadrant abdominal pain [R10.31]    Time of treatment:  PT Received On: 05/25/21  Start Time: 1435 Stop Time: 1515  Time Calculation (min): 40 min       Treatment #: PT Visit Number: 1/5    Patient's medical condition is appropriate for Physical Therapy intervention at this time.    Interpreter utilized: no, not indicated    Assessment   Patient is progressing in PT. Able to participate in PT, min A transfers with bed adjustment, patient ambualted 7 ' in room with rw. Cga, unable to leave sitting in shair 2* decreasing BP 99/61 after ambulation., 112/68 when returned to bed, desating on room air 87%, 94% on 2L. Cga transfer to bedside commode. Nursing notified.     PMP - Progressive Mobility Protocol   PMP Activity: Step 6 - Walks in Room  Distance Walked (ft) (Step 6,7): 7 Feet     Plan   Based on today's performance:  Discharge Recommendation: SNF   DME Recommended for Discharge: Front wheel walker     If recommended d/c disposition is not available, patient will need 24 hour care.  Transport Recommendations: stretcher transport    Discharge recommendations are based on patient's progression/regression. Please see most recent note for updated discharge recommendations.    Continue plan of care.    Interdisciplinary Communication: rn      Subjective   I can try and do this  Patient is agreeable to participation in the therapy session.  Pain: 5/10  Location: abdomen  Therapist Intervention: positioned for comfort and premedicated for pain by RN  Patient is satisfied with therapist intervention.    Vitals:   Vitals:    05/25/21 0412 05/25/21 0841 05/25/21 0919 05/25/21 1147   BP: 107/68  132/73 132/73 115/70   Pulse: 72 74  70   Resp: 16 17  17    Temp: 98.4 F (36.9 C) 99.5 F (37.5 C)  98.8 F (37.1 C)   TempSrc: Oral Oral  Oral   SpO2: 98% 99%  97%   Weight:       Height:           Objective     Precautions/ Contraindications: abdominal precautions, NPO  Precautions  Weight Bearing Status: no restrictions    Patient is in bed with  Intravenous (IV), O2 at 2 liters/minute via NC , and Wound Vac in place.      Therapeutic exercises:  Ankle Pumps: 25  Heel Slides: 15B      Functional Mobility:  Supine to Sit: min A  Scooting: CGA  Sit to Supine: MIN A  Sit to Stand: MInA  Stand to Sit: min A  Transfer Training: minA/ CGA    Gait:   WB status: FW  Assistive Device: RW  Assist Level: CGA  Distance: 7'  Pattern: wide bos   Balance Training:  fair    Educated the patient to role of physical therapy, plan of care, goals  of therapy, rationale for progressing mobility and HEP, safety with mobility and ADLs, and  energy conservation techniques.    RN notified of session outcome and that patient was left in bed with all needs met and equipment intact.   Safety measures include: handoff to nurse/clin tech/ unit secretary completed.   Mobility and ADL status posted at bedside and within E.M.R.    Goals per Eval/ Re-eval:   Goals  Goal Formulation: With patient  Time for Goal Acheivement: 5 visits  Pt Will Go Supine To Sit: with supervision  Pt Will Perform Sit To Supine: with supervision  Pt Will Perform Sit to Stand: with supervision  Pt Will Ambulate: > 200 feet, with supervision  Pt Will Go Up / Down Stairs: 6-10 stairs, with supervision        Therapist PPE during session procedural mask    Signature:  Clearnce Sorrel, PTA  05/25/2021 3:29 PM   Phone: [phone]     (For scheduling questions, please contact rehab tech [phone])

## 2021-05-25 NOTE — Plan of Care (Signed)
Called son Dorma Russell to inform him SNF is recommended for the pt.  Plans to discuss with family members tomorrow and get back to me.

## 2021-05-25 NOTE — Progress Notes (Signed)
MEDICINE PROGRESS NOTE  [facility], DIVISION OF HOSPITALIST MEDICINE   [place] [facility]   [place] Pager: [phone]  Patient: Emily [name]   MRN: [id number]     Active Hospital Problems    Diagnosis    Ischemic colon    History of colonic polyps    AKI (acute kidney injury)    Acute colitis    Mixed hyperlipidemia    Hypertension        A 75 y.o. female with PMH of breast cancer status post right breast mastectomy, GERD, hypertension, hypothyroidism who presented with acute abdominal pain and was found to have right-sided colitis.     Assessment /Plan  Acute ischemic colitis  Status post exploratory laparotomy and right hemicolectomy 05/23/2021  Follow-up blood culture   Surgery follow-up, Continue wound VAC  Keep n.p.o.  Continue IV fluids and Zosyn    Anemia of chronic disease, stable  Monitor CBC    Deconditioning due to generalized weakness  Activity as tolerated  Consult PT and OT recommended skilled nursing facility    Essential hypertension  Continue amlodipine and lisinopril     Hypothyroidism  Continue Synthroid     Hyperlipidemia  Continue Crestor    acetaminophen, 650 mg, 4 times per day  amLODIPine, 10 mg, Daily  docusate sodium, 200 mg, Daily  gabapentin, 100 mg, Q8H SCH  heparin (porcine), 5,000 Units, Q8H SCH  heparin (porcine), 5,000 Units, Pre-Op  levothyroxine, 25 mcg, QAM  lidocaine, 1 patch, Q24H  lisinopril, 40 mg, Daily  piperacillin-tazobactam, 4.5 g, Q8H  rosuvastatin, 20 mg, Daily    glucagon (rDNA), 1 mg, PRN   And  dextrose, 250 mL, PRN   And  dextrose, 25 g, PRN   And  dextrose, 25 g, PRN  traMADol, 50 mg, Q6H PRN   Or  HYDROmorphone, 0.4 mg, Q6H PRN  magnesium sulfate, 1 g, PRN  meclizine, 25 mg, TID PRN  melatonin, 3 mg, QHS PRN  naloxone, 0.2 mg, PRN  potassium & sodium phosphates, 2 packet, PRN  potassium chloride, 0-40 mEq, PRN   And  potassium chloride, 10 mEq, PRN     dextrose 5 % and 0.45% NaCl 100 mL/hr at 05/25/21 1209        Diagnoses, management and prognosis  has been discussed with the patient.  I have spent 30 minutes in direct patient care and coordination of treatment; including reviewing chart (including old records), labs, Vitals, placing orders, d/w attending RN, ICM, and charting extensive complicated medical hx etc.     Subjective/ROS:   Patient is somnolent but easily arousable, still having abdominal pain and mild nausea but no vomiting, no bowel movement  All other systems were reviewed and were negative.     Physical Exam:  Temp:  [98.4 F (36.9 C)-100.8 F (38.2 C)] 98.4 F (36.9 C)  Heart Rate:  [67-86] 67  Resp Rate:  [15-17] 16  BP: (107-132)/(66-73) 112/67  Intake and Output Summary-last 24 Hrs:  I/O last 3 completed shifts:  In: -   Out: 2750 [Urine:2750]  O2 Flow Rate (L/min)  Avg: 2 L/min  Min: 2 L/min  Max: 2 L/min by O2 Device: Nasal cannula  General: Chronic ill appearance, not in distress.  HEENT: Normocephalic, sclera anicteric, pallor.  Neck: Supple, No JVD.  Respiratory: Clear bilaterally  Cardiovascular: S1 S2, RRR.  Abdominal: Soft, wound VAC in place, mildly tender, no rebound or rigidity, BS positive.  Extremities: No cyanosis or edema.  I have reviewed the following pertinent result(s), including: all available   No results found.  No results for input(s): GLUCOSEWB in the last 24 hours.  Recent Labs   Lab 05/25/21  0508 05/24/21  0515 05/23/21  0731 05/22/21  1935   Sodium 141 141 142 140   Potassium 3.7 3.9 4.6 4.3   Chloride 105 106 107 103   BUN 7.0 7.0 12.0 18.0   Creatinine 0.8 0.9 1.0 1.4*   EGFR >60.0 >60.0 >60.0 44.2   Glucose 100 152* 188* 152*   Calcium 8.1 8.4 8.9 9.5       Recent Labs   Lab 05/25/21  0508 05/24/21  0515 05/23/21  0731 05/22/21  1935   WBC 19.04* 17.92* 20.59* 14.81*   Hgb 9.4* 9.6* 10.9* 10.6*   Hematocrit 31.2* 32.1* 36.7 33.3*   Platelets 266 276 335 324       Recent Labs   Lab 05/22/21  2143   PT 12.1   PT INR 1.1       Recent Labs   Lab 05/22/21  1935   Alkaline Phosphatase 80   Bilirubin, Total 0.3    ALT 11   AST (SGOT) 23             Argentina Ponder, MD, FACP     This note was generated by the Epic EMR system/ Dragon speech recognition and may contain inherent errors or omissions not intended by the user. Grammatical errors, random word insertions, deletions, pronoun errors and incomplete sentences are occasional consequences of this technology due to software limitations. Not all errors are caught or corrected. If there are questions or concerns about the content of this note or information contained within the body of this dictation they should be addressed directly with the author for clarification.

## 2021-05-25 NOTE — Plan of Care (Signed)
Pt A&OX4. VSS on 2L NC.   Prevena to abdominal incision is compressed/intact/no leak.   CFG 3/10 met.   Pt NPO at this time. Not passing flatus at this time. +BS.   IS encouraged.   Call bell within reach. The Pt agrees to the care plan and goals.      Problem: Safety  Goal: Patient will be free from injury during hospitalization  Outcome: Progressing  Flowsheets (Taken 05/25/2021 1803)  Patient will be free from injury during hospitalization:   Assess patient's risk for falls and implement fall prevention plan of care per policy   Use appropriate transfer methods   Hourly rounding   Provide and maintain safe environment  Goal: Patient will be free from infection during hospitalization  Outcome: Progressing  Flowsheets (Taken 05/25/2021 1803)  Free from Infection during hospitalization:   Assess and monitor for signs and symptoms of infection   Monitor all insertion sites (i.e. indwelling lines, tubes, urinary catheters, and drains)   Encourage patient and family to use good hand hygiene technique   Monitor lab/diagnostic results     Problem: Pain  Goal: Pain at adequate level as identified by patient  Outcome: Progressing  Flowsheets (Taken 05/25/2021 1803)  Pain at adequate level as identified by patient:   Identify patient comfort function goal   Assess for risk of opioid induced respiratory depression, including snoring/sleep apnea. Alert healthcare team of risk factors identified.   Assess pain on admission, during daily assessment and/or before any "as needed" intervention(s)   Reassess pain within 30-60 minutes of any procedure/intervention, per Pain Assessment, Intervention, Reassessment (AIR) Cycle   Evaluate if patient comfort function goal is met   Offer non-pharmacological pain management interventions     Problem: Side Effects from Pain Analgesia  Goal: Patient will experience minimal side effects of analgesic therapy  Outcome: Progressing  Flowsheets (Taken 05/25/2021 1803)  Patient will experience minimal  side effects of analgesic therapy:   Monitor/assess patient's respiratory status (RR depth, effort, breath sounds)   Prevent/manage side effects per LIP orders (i.e. nausea, vomiting, pruritus, constipation, urinary retention, etc.)   Evaluate for opioid-induced sedation with appropriate assessment tool (i.e. POSS)   Assess for changes in cognitive function     Problem: Discharge Barriers  Goal: Patient will be discharged home or other facility with appropriate resources  Outcome: Progressing  Flowsheets (Taken 05/25/2021 1803)  Discharge to home or other facility with appropriate resources: Provide appropriate patient education     Problem: Psychosocial and Spiritual Needs  Goal: Demonstrates ability to cope with hospitalization/illness  Outcome: Progressing  Flowsheets (Taken 05/25/2021 1803)  Demonstrates ability to cope with hospitalizations/illness:   Encourage verbalization of feelings/concerns/expectations   Provide quiet environment     Problem: Inadequate Airway Clearance  Goal: Patent Airway maintained  Outcome: Progressing  Flowsheets (Taken 05/25/2021 1803)  Patent airway maintained:   Position patient for maximum ventilatory efficiency   Provide adequate fluid intake to liquefy secretions   Reinforce use of ordered respiratory interventions (i.e. CPAP, BiPAP, Incentive Spirometer, Acapella, etc.)     Problem: Infection Prevention  Goal: Free from infection  Outcome: Progressing  Flowsheets (Taken 05/25/2021 1803)  Free from infection:   Monitor/assess vital signs   Encourage/assist patient to turn, cough and perform deep breathing every 2 hours     Problem: Constipation  Goal: Fluid and electrolyte balance are achieved/maintained  Outcome: Progressing  Flowsheets (Taken 05/25/2021 1803)  Fluid and electrolyte balance are achieved/maintained:   Monitor intake  and output every shift   Provide adequate hydration     Problem: Moderate/High Fall Risk Score >5  Goal: Patient will remain free of falls  Outcome:  Progressing  Flowsheets (Taken 05/25/2021 0930)  High (Greater than 13):   HIGH-Consider use of low bed   HIGH-Apply yellow "Fall Risk" arm band   HIGH-Bed alarm on at all times while patient in bed   MOD-Place Fall Risk level on whiteboard in room   MOD-Request PT/OT consult order for patients with gait/mobility impairment   MOD-Perform dangle, stand, walk (DSW) prior to mobilization   MOD-Use of assistive devices -Bedside Commode if appropriate   MOD-Remain with patient during toileting   LOW-Anticoagulation education for injury risk   LOW-Fall Interventions Appropriate for Low Fall Risk     Problem: Compromised Tissue integrity  Goal: Damaged tissue is healing and protected  Outcome: Progressing  Flowsheets (Taken 05/25/2021 1803)  Damaged tissue is healing and protected:   Monitor/assess Braden scale every shift   Increase activity as tolerated/progressive mobility   Provide wound care per wound care algorithm  Goal: Nutritional status is improving  Outcome: Progressing  Flowsheets (Taken 05/25/2021 1803)  Nutritional status is improving: Allow adequate time for meals

## 2021-05-25 NOTE — Plan of Care (Signed)
Problem: Safety  Goal: Patient will be free from injury during hospitalization  Outcome: Progressing  Flowsheets (Taken 05/25/2021 0600)  Patient will be free from injury during hospitalization:   Assess patient's risk for falls and implement fall prevention plan of care per policy   Provide and maintain safe environment   Use appropriate transfer methods   Ensure appropriate safety devices are available at the bedside   Include patient/ family/ care giver in decisions related to safety   Hourly rounding  Goal: Patient will be free from infection during hospitalization  Outcome: Progressing  Flowsheets (Taken 05/25/2021 0600)  Free from Infection during hospitalization:   Assess and monitor for signs and symptoms of infection   Monitor lab/diagnostic results   Monitor all insertion sites (i.e. indwelling lines, tubes, urinary catheters, and drains)   Encourage patient and family to use good hand hygiene technique     Problem: Pain  Goal: Pain at adequate level as identified by patient  Outcome: Progressing  Flowsheets (Taken 05/25/2021 0600)  Pain at adequate level as identified by patient:   Identify patient comfort function goal   Assess for risk of opioid induced respiratory depression, including snoring/sleep apnea. Alert healthcare team of risk factors identified.   Assess pain on admission, during daily assessment and/or before any "as needed" intervention(s)   Reassess pain within 30-60 minutes of any procedure/intervention, per Pain Assessment, Intervention, Reassessment (AIR) Cycle   Evaluate if patient comfort function goal is met   Evaluate patient's satisfaction with pain management progress   Offer non-pharmacological pain management interventions     Problem: Side Effects from Pain Analgesia  Goal: Patient will experience minimal side effects of analgesic therapy  Outcome: Progressing  Flowsheets (Taken 05/25/2021 0600)  Patient will experience minimal side effects of analgesic therapy:   Monitor/assess  patient's respiratory status (RR depth, effort, breath sounds)   Prevent/manage side effects per LIP orders (i.e. nausea, vomiting, pruritus, constipation, urinary retention, etc.)   Assess for changes in cognitive function   Evaluate for opioid-induced sedation with appropriate assessment tool (i.e. POSS)     Problem: Discharge Barriers  Goal: Patient will be discharged home or other facility with appropriate resources  Outcome: Progressing  Flowsheets (Taken 05/25/2021 0600)  Discharge to home or other facility with appropriate resources: Provide appropriate patient education     Problem: Psychosocial and Spiritual Needs  Goal: Demonstrates ability to cope with hospitalization/illness  Outcome: Progressing  Flowsheets (Taken 05/25/2021 0600)  Demonstrates ability to cope with hospitalizations/illness:   Encourage verbalization of feelings/concerns/expectations   Provide quiet environment   Encourage patient to set small goals for self   Encourage participation in diversional activity   Include patient/ patient care companion in decisions     Problem: Inadequate Airway Clearance  Goal: Patent Airway maintained  Outcome: Progressing  Flowsheets (Taken 05/25/2021 0600)  Patent airway maintained:   Position patient for maximum ventilatory efficiency   Provide adequate fluid intake to liquefy secretions   Reinforce use of ordered respiratory interventions (i.e. CPAP, BiPAP, Incentive Spirometer, Acapella, etc.)   Reposition patient every 2 hours and as needed unless able to self-reposition     Problem: Infection Prevention  Goal: Free from infection  Outcome: Progressing  Flowsheets (Taken 05/25/2021 0600)  Free from infection:   Monitor/assess vital signs   Assess incision for evidence of healing   Assess for signs and symptoms of infection   Monitor/assess output from surgical drain if present   Monitor/assess lab values and report  abnormal values     Problem: Constipation  Goal: Fluid and electrolyte balance are  achieved/maintained  Outcome: Progressing  Flowsheets (Taken 05/25/2021 0600)  Fluid and electrolyte balance are achieved/maintained:   Monitor intake and output every shift   Monitor/assess lab values and report abnormal values   Provide adequate hydration   Assess and reassess fluid and electrolyte status   Monitor for muscle weakness     Problem: Moderate/High Fall Risk Score >5  Goal: Patient will remain free of falls  Outcome: Progressing  Flowsheets (Taken 05/25/2021 0500)  High (Greater than 13):   HIGH-Apply yellow "Fall Risk" arm band   HIGH-Bed alarm on at all times while patient in bed   MOD-Place Fall Risk level on whiteboard in room   MOD-Use of assistive devices -Bedside Commode if appropriate   MOD-Remain with patient during toileting     Problem: Compromised Tissue integrity  Goal: Damaged tissue is healing and protected  Outcome: Progressing  Flowsheets (Taken 05/25/2021 0600)  Damaged tissue is healing and protected:   Monitor/assess Braden scale every shift   Increase activity as tolerated/progressive mobility   Relieve pressure to bony prominences for patients at moderate and high risk   Avoid shearing injuries   Keep intact skin clean and dry   Use bath wipes, not soap and water, for daily bathing   Use incontinence wipes for cleaning urine, stool and caustic drainage. Foley care as needed   Monitor external devices/tubes for correct placement to prevent pressure, friction and shearing   Encourage use of lotion/moisturizer on skin   Monitor patient's hygiene practices

## 2021-05-26 ENCOUNTER — Ambulatory Visit (INDEPENDENT_AMBULATORY_CARE_PROVIDER_SITE_OTHER): Payer: Medicare Other | Admitting: Internal Medicine

## 2021-05-26 DIAGNOSIS — K559 Vascular disorder of intestine, unspecified: Secondary | ICD-10-CM

## 2021-05-26 LAB — CBC AND DIFFERENTIAL
Absolute NRBC: 0 10*3/uL (ref 0.00–0.00)
Basophils Absolute Automated: 0.04 10*3/uL (ref 0.00–0.08)
Basophils Automated: 0.3 %
Eosinophils Absolute Automated: 0.18 10*3/uL (ref 0.00–0.44)
Eosinophils Automated: 1.3 %
Hematocrit: 29.4 % — ABNORMAL LOW (ref 34.7–43.7)
Hgb: 8.9 g/dL — ABNORMAL LOW (ref 11.4–14.8)
Immature Granulocytes Absolute: 0.08 10*3/uL — ABNORMAL HIGH (ref 0.00–0.07)
Immature Granulocytes: 0.6 %
Lymphocytes Absolute Automated: 1.42 10*3/uL (ref 0.42–3.22)
Lymphocytes Automated: 10.3 %
MCH: 26.9 pg (ref 25.1–33.5)
MCHC: 30.3 g/dL — ABNORMAL LOW (ref 31.5–35.8)
MCV: 88.8 fL (ref 78.0–96.0)
MPV: 12 fL (ref 8.9–12.5)
Monocytes Absolute Automated: 1.11 10*3/uL — ABNORMAL HIGH (ref 0.21–0.85)
Monocytes: 8.1 %
Neutrophils Absolute: 10.9 10*3/uL — ABNORMAL HIGH (ref 1.10–6.33)
Neutrophils: 79.4 %
Nucleated RBC: 0 /100 WBC (ref 0.0–0.0)
Platelets: 274 10*3/uL (ref 142–346)
RBC: 3.31 10*6/uL — ABNORMAL LOW (ref 3.90–5.10)
RDW: 13 % (ref 11–15)
WBC: 13.73 10*3/uL — ABNORMAL HIGH (ref 3.10–9.50)

## 2021-05-26 LAB — BASIC METABOLIC PANEL
Anion Gap: 5 (ref 5.0–15.0)
BUN: 7 mg/dL (ref 7.0–19.0)
CO2: 29 mEq/L (ref 22–29)
Calcium: 8.2 mg/dL (ref 7.9–10.2)
Chloride: 108 mEq/L (ref 100–111)
Creatinine: 0.8 mg/dL (ref 0.6–1.0)
Glucose: 107 mg/dL — ABNORMAL HIGH (ref 70–100)
Potassium: 3.3 mEq/L — ABNORMAL LOW (ref 3.5–5.1)
Sodium: 142 mEq/L (ref 136–145)

## 2021-05-26 LAB — GFR: EGFR: >60

## 2021-05-26 MED ORDER — POTASSIUM CHLORIDE 10 MEQ/100ML IV SOLN (WRAP)
10.0000 meq | INTRAVENOUS | Status: AC
Start: 2021-05-26 — End: 2021-05-26
  Administered 2021-05-26 (×2): 10 meq via INTRAVENOUS
  Filled 2021-05-26 (×2): qty 100

## 2021-05-26 NOTE — Plan of Care (Addendum)
Patient is alert and oriented x 4, can be forgetful. VSS. Denies N/V, CP and SOB.  pt is on RA, need PRN oxygen when up and ambulating. C/o pain tramadol administered per [facility], pt is on scheduled tylenol. Voiding adequately, no Flatus, no BM. Prevena to midline incision C/D/I. Call light within reach, falls precaution maintained, bed in the lowest position., alarm activated. Will continue to monitor patient closely.

## 2021-05-26 NOTE — Plan of Care (Signed)
Problem: Safety  Goal: Patient will be free from injury during hospitalization  Flowsheets (Taken 05/26/2021 0347 by Hermelinda Dellen, RN)  Patient will be free from injury during hospitalization:   Assess patient's risk for falls and implement fall prevention plan of care per policy   Hourly rounding   Provide and maintain safe environment  Goal: Patient will be free from infection during hospitalization  Flowsheets (Taken 05/25/2021 1803 by Allena Napoleon, RN)  Free from Infection during hospitalization:   Assess and monitor for signs and symptoms of infection   Monitor all insertion sites (i.e. indwelling lines, tubes, urinary catheters, and drains)   Encourage patient and family to use good hand hygiene technique   Monitor lab/diagnostic results     Problem: Pain  Goal: Pain at adequate level as identified by patient  Flowsheets (Taken 05/26/2021 0347 by Hermelinda Dellen, RN)  Pain at adequate level as identified by patient:   Identify patient comfort function goal   Evaluate if patient comfort function goal is met   Offer non-pharmacological pain management interventions     Problem: Side Effects from Pain Analgesia  Goal: Patient will experience minimal side effects of analgesic therapy  Flowsheets (Taken 05/25/2021 1803 by Allena Napoleon, RN)  Patient will experience minimal side effects of analgesic therapy:   Monitor/assess patient's respiratory status (RR depth, effort, breath sounds)   Prevent/manage side effects per LIP orders (i.e. nausea, vomiting, pruritus, constipation, urinary retention, etc.)   Evaluate for opioid-induced sedation with appropriate assessment tool (i.e. POSS)   Assess for changes in cognitive function     Problem: Discharge Barriers  Goal: Patient will be discharged home or other facility with appropriate resources  Flowsheets (Taken 05/25/2021 1803 by Allena Napoleon, RN)  Discharge to home or other facility with appropriate resources: Provide appropriate patient education

## 2021-05-26 NOTE — Progress Notes (Signed)
MEDICINE PROGRESS NOTE  [facility], DIVISION OF HOSPITALIST MEDICINE   [facility]   [place] Pager: [phone]  Patient: Emily [name]   MRN: [id number]     Active Hospital Problems    Diagnosis    Ischemic colon    History of colonic polyps    AKI (acute kidney injury)    Acute colitis    Mixed hyperlipidemia    Hypertension        A 75 y.o. female with PMH of breast cancer status post right breast mastectomy, GERD, hypertension, hypothyroidism who presented with acute abdominal pain and was found to have right-sided colitis.     Assessment /Plan  Acute ischemic colitis, Status post exploratory laparotomy and right hemicolectomy 05/23/2021  Leukocytosis improving  Follow-up blood culture, negative to date  Surgery follow-up, Continue wound VAC  Keep n.p.o.  Continue IV fluids and Zosyn  Out of bed, advised to walk    Anemia of chronic disease, stable  Monitor CBC    Deconditioning due to generalized weakness  Activity as tolerated  Consult PT and OT recommended skilled nursing facility when medically ready for discharge    Essential hypertension  Continue amlodipine and lisinopril     Hypothyroidism  Continue Synthroid     Hyperlipidemia  Continue Crestor    acetaminophen, 650 mg, 4 times per day  amLODIPine, 10 mg, Daily  docusate sodium, 200 mg, Daily  gabapentin, 100 mg, Q8H SCH  heparin (porcine), 5,000 Units, Q8H SCH  heparin (porcine), 5,000 Units, Pre-Op  levothyroxine, 25 mcg, QAM  lisinopril, 40 mg, Daily  piperacillin-tazobactam, 4.5 g, Q8H  rosuvastatin, 20 mg, Daily    glucagon (rDNA), 1 mg, PRN   And  dextrose, 250 mL, PRN   And  dextrose, 25 g, PRN   And  dextrose, 25 g, PRN  traMADol, 50 mg, Q6H PRN   Or  HYDROmorphone, 0.4 mg, Q6H PRN  magnesium sulfate, 1 g, PRN  meclizine, 25 mg, TID PRN  melatonin, 3 mg, QHS PRN  naloxone, 0.2 mg, PRN  potassium & sodium phosphates, 2 packet, PRN  potassium chloride, 0-40 mEq, PRN   And  potassium chloride, 10 mEq, PRN     dextrose 5 % and  0.45% NaCl 100 mL/hr at 05/26/21 0805        Diagnoses, management and prognosis has been discussed with the patient.  I have spent 30 minutes in direct patient care and coordination of treatment; including reviewing chart, labs, Vitals, placing orders, d/w attending RN, ICM, and charting extensive complicated medical hx etc.     Subjective/ROS:   Patient is feeling better, denies abdominal pain, no vomiting, no bowel movement or flatus  All other systems were reviewed and were negative.     Physical Exam:  Temp:  [97.5 F (36.4 C)-99 F (37.2 C)] 99 F (37.2 C)  Heart Rate:  [66-82] 82  Resp Rate:  [16-18] 18  BP: (105-126)/(62-69) 117/66  Intake and Output Summary-last 24 Hrs:  I/O last 3 completed shifts:  In: -   Out: 1000 [Urine:1000]  O2 Flow Rate (L/min)  Avg: 1.8 L/min  Min: 1 L/min  Max: 2 L/min by O2 Device: None (Room air)  General: Chronic ill appearance, nodistress.  HEENT: Normocephalic, sclera anicteric, pallor.  Neck: Supple, No JVD.  Respiratory: Clear bilaterally  Cardiovascular: S1 S2  Abdominal: Soft, wound VAC in place, mildly tender, no rebound or rigidity, BS positive.  Extremities: No cyanosis or edema.  I have reviewed the following pertinent result(s), including: all available   No results found.  No results for input(s): GLUCOSEWB in the last 24 hours.  Recent Labs   Lab 05/26/21  0628 05/25/21  0508 05/24/21  0515 05/23/21  0731   Sodium 142 141 141 142   Potassium 3.3* 3.7 3.9 4.6   Chloride 108 105 106 107   BUN 7.0 7.0 7.0 12.0   Creatinine 0.8 0.8 0.9 1.0   EGFR >60.0 >60.0 >60.0 >60.0   Glucose 107* 100 152* 188*   Calcium 8.2 8.1 8.4 8.9       Recent Labs   Lab 05/26/21  0628 05/25/21  0508 05/24/21  0515 05/23/21  0731   WBC 13.73* 19.04* 17.92* 20.59*   Hgb 8.9* 9.4* 9.6* 10.9*   Hematocrit 29.4* 31.2* 32.1* 36.7   Platelets 274 266 276 335       Recent Labs   Lab 05/22/21  2143   PT 12.1   PT INR 1.1       Recent Labs   Lab 05/22/21  1935   Alkaline Phosphatase 80    Bilirubin, Total 0.3   ALT 11   AST (SGOT) 23             Argentina Ponder, MD, FACP     This note was generated by the Epic EMR system/ Dragon speech recognition and may contain inherent errors or omissions not intended by the user. Grammatical errors, random word insertions, deletions, pronoun errors and incomplete sentences are occasional consequences of this technology due to software limitations. Not all errors are caught or corrected. If there are questions or concerns about the content of this note or information contained within the body of this dictation they should be addressed directly with the author for clarification.

## 2021-05-26 NOTE — Progress Notes (Signed)
POST OPERATIVE PROGRESS NOTE  [facility] Spectralink: [id number]    Date Time: 05/26/21 9:48 AM  Patient Name: Emily [name]      ASSESSMENT:   4 Days Post-Op S/P Procedure(s):  LAPAROSCOPY, DIAGNOSTIC  EXPLORATORY LAPAROTOMY  LAPAROTOMY, COLECTOMY, RIGHT    WBC 13.73, H/H 8.9/29, K 3.3    PLAN:   -Await bowel function prior to advancing diet. Maintain NPO, IVF  -Encourage ambulation, PT/OT, IS. Out of bed to chair.   -Pain control  -Continue abx  -Plan discussed and agreed upon by Dr. Allyne Gee.     SUBJECTIVE:   The patient is doing fairly well.  Post operative pain is well controlled with medications.  Current dietary status:  Diet NPO effective now Except for: SIPS WITH MEDS and tolerating well.  Flatus: no. BM:  no.  Additional complaints: none       OBJECTIVE:   Current Vitals:   Vitals:    05/26/21 0758   BP: 126/65   Pulse: 68   Resp: 18   Temp: 98.8 F (37.1 C)   SpO2: 95%       Intake and Output Summary (Last 24 hours):  I/O last 3 completed shifts:  In: -   Out: 1000 [Urine:1000]    Labs:     Results       Procedure Component Value Units Date/Time    Basic Metabolic Panel [[id number]]  (Abnormal) Collected: 05/26/21 0628    Specimen: Blood Updated: 05/26/21 0819     Glucose 107 mg/dL      BUN 7.0 mg/dL      Creatinine 0.8 mg/dL      Calcium 8.2 mg/dL      Sodium 629 mEq/L      Potassium 3.3 mEq/L      Chloride 108 mEq/L      CO2 29 mEq/L      Anion Gap 5.0    GFR [[id number]] Collected: 05/26/21 0628     Updated: 05/26/21 0819     EGFR >60.0    CBC and differential [[id number]]  (Abnormal) Collected: 05/26/21 0628    Specimen: Blood Updated: 05/26/21 0757     WBC 13.73 x10 3/uL      Hgb 8.9 g/dL      Hematocrit 64.4 %      Platelets 274 x10 3/uL      RBC 3.31 x10 6/uL      MCV 88.8 fL      MCH 26.9 pg      MCHC 30.3 g/dL      RDW 13 %      MPV 12.0 fL      Neutrophils 79.4 %      Lymphocytes Automated 10.3 %      Monocytes 8.1 %      Eosinophils Automated 1.3 %      Basophils Automated 0.3 %      Immature Granulocytes  0.6 %      Nucleated RBC 0.0 /100 WBC      Neutrophils Absolute 10.90 x10 3/uL      Lymphocytes Absolute Automated 1.42 x10 3/uL      Monocytes Absolute Automated 1.11 x10 3/uL      Eosinophils Absolute Automated 0.18 x10 3/uL      Basophils Absolute Automated 0.04 x10 3/uL      Immature Granulocytes Absolute 0.08 x10 3/uL      Absolute NRBC 0.00 x10 3/uL     Culture Blood Aerobic and Anaerobic [[id number]] Collected: 05/22/21 1935  Specimen: Arm from Blood, Venipuncture Updated: 05/26/21 0121    Narrative:      ORDER#: [id number]                                    ORDERED BY: Macky Lower  SOURCE: Blood, Venipuncture arm                      COLLECTED:  05/22/21 19:35  ANTIBIOTICS AT COLL.:                                RECEIVED :  05/23/21 00:58  Culture Blood Aerobic and Anaerobic        PRELIM      05/26/21 01:21  05/24/21   No Growth after 1 day/s of incubation.  05/25/21   No Growth after 2 day/s of incubation.  05/26/21   No Growth after 3 day/s of incubation.      Culture Blood Aerobic and Anaerobic [[id number]] Collected: 05/22/21 2005    Specimen: Blood, Venipuncture Updated: 05/26/21 0121    Narrative:      ORDER#: [id number]                                    ORDERED BY: Macky Lower  SOURCE: Blood, Venipuncture                          COLLECTED:  05/22/21 20:05  ANTIBIOTICS AT COLL.:                                RECEIVED :  05/23/21 00:58  Culture Blood Aerobic and Anaerobic        PRELIM      05/26/21 01:21  05/24/21   No Growth after 1 day/s of incubation.  05/25/21   No Growth after 2 day/s of incubation.  05/26/21   No Growth after 3 day/s of incubation.              Rads:     Radiology Results (24 Hour)       ** No results found for the last 24 hours. **            Physical Exam:     Mental status - alert, oriented to person, place, and time, normal mood, behavior, speech, dress, motor activity, and thought processes  Chest - no tachypnea, retractions or cyanosis  Heart - normal rate and  regular rhythm  Abdomen - soft, non tender, nondistended, no masses or organomegaly  Wound - clean, dry, no drainage, prevena vac in place  Extremities - no pedal edema noted      Signed by:   Joesph July  Speciality Physician Assistant  [phone]

## 2021-05-26 NOTE — PT Progress Note (Signed)
Physical Therapy Note  [facility]  [address]  [place] [address]  [phone]    Physical Therapy Treatment    Patient:  Emily [name]        MRN#:  [id number]  Unit:  [facility] 4C        Room/Bed:  C166/A630.16    Medical Diagnosis: Small bowel ischemia [K55.9]  Right lower quadrant abdominal pain [R10.31]    Time of treatment:  PT Received On: 05/26/21  Start Time: 1310 Stop Time: 1342  Time Calculation (min): 32 min       Treatment #: PT Visit Number: 2/5    Patient's medical condition is appropriate for Physical Therapy intervention at this time.    Interpreter utilized: no, not indicated    Assessment   Pt found seated in W/C with IV running, wound drain and on RA. Pt required cues/demo for PLB to restore SpO2 from 90 to 95% on RA. Pt needed CG to stand w/ RW and CG to ambulate 15'w/ RW, progressing to IV pole UE support for second bout of 15'. Noted to have SpO2 95-86% briefly with activity, but able to return to 90% in 60 sec w/PLB. Able to demo LE exs following instructions. May be able to tolerate further ambulation if on O2 support when progressed to outside room next session.    PMP - Progressive Mobility Protocol   PMP Activity: Step 6 - Walks in Room  Distance Walked (ft) (Step 6,7): 15 Feet     Plan   Based on today's performance:  Discharge Recommendation: SNF   DME Recommended for Discharge: Front wheel walker     If recommended d/c disposition is not available, patient will need ALF w/HHPT.  Transport Recommendations: wheelchair van/transport    Discharge recommendations are based on patient's progression/regression. Please see most recent note for updated discharge recommendations.    Continue plan of care. Use IV pole and O2 support for progressing ambulation distance outside room.    Interdisciplinary Communication: Nursing made aware of session outcome.    Subjective     Patient is agreeable to participation in the therapy session. Nursing  clears patient for therapy.  Pain: 8/10  Location: R abdomen  Therapist Intervention: positioned for comfort, premedicated for pain by RN, and RN notified of pt's request for pain medication  Patient is satisfied with therapist intervention.    Vitals: HR 68-84, SpO2 95-86% (briefly with activity), able to use PLB to return to 90% in 60 sec.  Vitals:    05/25/21 2337 05/26/21 0341 05/26/21 0758 05/26/21 1245   BP: 105/62 116/68 126/65 117/66   Pulse: 66 71 68 82   Resp: 16 16 18 18    Temp: 98.6 F (37 C) 97.5 F (36.4 C) 98.8 F (37.1 C) 99 F (37.2 C)   TempSrc: Oral Oral Oral Oral   SpO2: 100% 98% 95% 93%   Weight:       Height:           Objective     Precautions/ Contraindications:   Precautions  Weight Bearing Status: no restrictions  Other Precautions: NPO, limb alert RUE    Patient is seated in a bedside chair (W/C)with  Intravenous (IV), Pulse Oximeter , Dressing, and wound drain in place. Telesitter in use.    Therapeutic exercises:  Pt performed seated LE exercises 10 x each:  Alternating hip flexion  Alternating Knee ext (LAQ)  Ankle DF/PF  Functional Mobility:  Scooting: supv  Sit to Supine: NT  Sit to Stand: CG/cues  Stand to Sit: CG/cues  Transfer Training: on/off W/C, BSC over toilet    Gait:   WB status: FWB  Assistive Device: RW, progressed to IV pole  Assist Level: CG  Distance: 15'x2  Pattern: decreased cadence and clearance, forward flexed trunk     Balance Training:  Supervision for standing dynamic balance with use of toilet, including clothing mgt, peri-care.    Educated the patient to role of physical therapy, plan of care, goals  of therapy, rationale for progressing mobility and abdominal protection, HEP, safety with mobility and ADLs, energy conservation techniques, pursed lip breathing, skin protection, and home safety.    RN notified of session outcome and that patient was left in W/C with all needs met and equipment intact.   Safety measures include: handoff to nurse/clin tech/  unit secretary completed, oriented to call bell and placed within reach, personal items within reach, assistive device positioned out of reach, Telesitter in use and bed placed in lowest position.   Mobility and ADL status posted at bedside and within E.M.R.    Goals per Eval/ Re-eval:   Goals  Goal Formulation: With patient  Time for Goal Acheivement: 5 visits  Pt Will Go Supine To Sit: with supervision  Pt Will Perform Sit To Supine: with supervision  Pt Will Perform Sit to Stand: with supervision  Pt Will Ambulate: > 200 feet, with supervision  Pt Will Go Up / Down Stairs: 6-10 stairs, with supervision    Therapist PPE during session procedural mask and gloves     Signature:  Chester Holstein, PT  05/26/2021 1:46 PM   Phone: [phone]     (For scheduling questions, please contact rehab tech [phone])

## 2021-05-26 NOTE — Plan of Care (Signed)
Pt is alert and oriented X4, on 2L NC, pain management, continuous IV fluids and IV antibiotics. CFG 3/10 met. Pravena to abdominal incision is intact/no leaks. Pt is NPO with sips with medication. Call light within reach of patient, bed alarm on.     Problem: Safety  Goal: Patient will be free from injury during hospitalization  Outcome: Progressing  Flowsheets (Taken 05/26/2021 0347)  Patient will be free from injury during hospitalization:   Assess patient's risk for falls and implement fall prevention plan of care per policy   Hourly rounding   Provide and maintain safe environment     Problem: Pain  Goal: Pain at adequate level as identified by patient  Outcome: Progressing  Flowsheets (Taken 05/26/2021 0347)  Pain at adequate level as identified by patient:   Identify patient comfort function goal   Evaluate if patient comfort function goal is met   Offer non-pharmacological pain management interventions     Problem: Inadequate Airway Clearance  Goal: Patent Airway maintained  Outcome: Progressing  Flowsheets (Taken 05/26/2021 0347)  Patent airway maintained: Reposition patient every 2 hours and as needed unless able to self-reposition     Problem: Infection Prevention  Goal: Free from infection  Flowsheets (Taken 05/26/2021 0347)  Free from infection:   Monitor/assess vital signs   Assess incision for evidence of healing   Monitor/assess output from surgical drain if present   Assess for signs and symptoms of infection

## 2021-05-27 ENCOUNTER — Encounter: Payer: Self-pay | Admitting: Surgery

## 2021-05-27 LAB — RENAL FUNCTION PANEL
Albumin: 2.5 g/dL — ABNORMAL LOW (ref 3.5–5.0)
Anion Gap: 8 (ref 5.0–15.0)
BUN: 5 mg/dL — ABNORMAL LOW (ref 7.0–19.0)
CO2: 29 mEq/L (ref 22–29)
Calcium: 8.4 mg/dL (ref 7.9–10.2)
Chloride: 107 mEq/L (ref 100–111)
Creatinine: 0.7 mg/dL (ref 0.6–1.0)
Glucose: 98 mg/dL (ref 70–100)
Phosphorus: 2.1 mg/dL — ABNORMAL LOW (ref 2.3–4.7)
Potassium: 3.3 mEq/L — ABNORMAL LOW (ref 3.5–5.1)
Sodium: 144 mEq/L (ref 136–145)

## 2021-05-27 LAB — GFR: EGFR: >60

## 2021-05-27 LAB — MAGNESIUM: Magnesium: 1.9 mg/dL (ref 1.6–2.6)

## 2021-05-27 NOTE — Progress Notes (Signed)
POST OPERATIVE PROGRESS NOTE    Date Time: 05/27/21 8:37 AM  Patient Name: Emily [name]  Surgical Attending: Madie Reno., MD    Subjective:     No new complaints.   Diet: Still NPO  Ambulating: Well     No flatus yet and still has abdominal discomfort.    Objective:     Vitals:    Temp (24hrs), Avg:98.9 F (37.2 C), Min:98.6 F (37 C), Max:99.1 F (37.3 C)      Temp: Temp: 98.8 F (37.1 C)   HR: Heart Rate: 76   BP: BP: 135/72   RR: Resp Rate: 16   SpO2 SpO2: 100 %   POCT Glucose:       Physical Exam:     General: awake, alert, oriented x 3; no acute distress.  Neck: supple, no lymphadenopathy, no thyromegaly  Cardiovascular: regular rate and rhythm, no murmurs, rubs or gallops  Lungs: clear to auscultation bilaterally, without wheezing, rhonchi, or rales  Abdomen: soft, non-tender, non-distended; no palpable masses, no hepatosplenomegaly, normoactive bowel sounds, no rebound or guarding; incision clean, dry and intact with VAC dressing.    Skin: no rashes or lesions noted      Results       Procedure Component Value Units Date/Time    GFR [[id number]] Collected: 05/27/21 0550     Updated: 05/27/21 0739     EGFR >60.0    Renal function panel [[id number]]  (Abnormal) Collected: 05/27/21 0550    Specimen: Blood Updated: 05/27/21 0739     Glucose 98 mg/dL      Sodium 962 mEq/L      Potassium 3.3 mEq/L      Chloride 107 mEq/L      CO2 29 mEq/L      BUN 5.0 mg/dL      Calcium 8.4 mg/dL      Creatinine 0.7 mg/dL      Albumin 2.5 g/dL      Phosphorus 2.1 mg/dL      Anion Gap 8.0    Magnesium [[id number]] Collected: 05/27/21 0550    Specimen: Blood Updated: 05/27/21 0739     Magnesium 1.9 mg/dL     Culture Blood Aerobic and Anaerobic [[id number]] Collected: 05/22/21 2005    Specimen: Blood, Venipuncture Updated: 05/27/21 0121    Narrative:      ORDER#: [id number]                                    ORDERED BY: Macky Lower  SOURCE: Blood, Venipuncture                          COLLECTED:  05/22/21  20:05  ANTIBIOTICS AT COLL.:                                RECEIVED :  05/23/21 00:58  Culture Blood Aerobic and Anaerobic        PRELIM      05/27/21 01:21  05/24/21   No Growth after 1 day/s of incubation.  05/25/21   No Growth after 2 day/s of incubation.  05/26/21   No Growth after 3 day/s of incubation.  05/27/21   No Growth after 4 day/s of incubation.      Culture Blood Aerobic and Anaerobic [[id number]] Collected: 05/22/21 1935  Specimen: Arm from Blood, Venipuncture Updated: 05/27/21 0121    Narrative:      ORDER#: [id number]                                    ORDERED BY: Macky Lower  SOURCE: Blood, Venipuncture arm                      COLLECTED:  05/22/21 19:35  ANTIBIOTICS AT COLL.:                                RECEIVED :  05/23/21 00:58  Culture Blood Aerobic and Anaerobic        PRELIM      05/27/21 01:21  05/24/21   No Growth after 1 day/s of incubation.  05/25/21   No Growth after 2 day/s of incubation.  05/26/21   No Growth after 3 day/s of incubation.  05/27/21   No Growth after 4 day/s of incubation.              Radiology Results (24 Hour)       ** No results found for the last 24 hours. **            Assessment/Plan:     5 Days Post-Op  Procedure(s):  LAPAROSCOPY, DIAGNOSTIC  EXPLORATORY LAPAROTOMY  LAPAROTOMY, COLECTOMY, RIGHT    Continue NPO/ IV fluid and ambulate until flatus.  Then can start diet.    Madie Reno., MD  05/27/2021 8:37 AM

## 2021-05-27 NOTE — Plan of Care (Signed)
Ao x 3.forgetful at times. C/o back pain 7/10. Medicated with prn dose of dilaudid.  NPO. Reports 2 bm during this shift. Voiding. MAE. Ambulates with walker and stand by assistance. Able to ambulate in hallway. VSS. RA sats 94-96%. Call bell within reach and side rails up x 3. RVM at bedside.   Problem: Safety  Goal: Patient will be free from injury during hospitalization  Outcome: Progressing  Flowsheets (Taken 05/27/2021 0449 by Odette Horns, RN)  Patient will be free from injury during hospitalization:   Assess patient's risk for falls and implement fall prevention plan of care per policy   Use appropriate transfer methods   Provide and maintain safe environment   Ensure appropriate safety devices are available at the bedside   Hourly rounding   Include patient/ family/ care giver in decisions related to safety  Goal: Patient will be free from infection during hospitalization  Outcome: Progressing     Problem: Pain  Goal: Pain at adequate level as identified by patient  Outcome: Progressing  Flowsheets (Taken 05/27/2021 0449 by Odette Horns, RN)  Pain at adequate level as identified by patient:   Identify patient comfort function goal   Assess pain on admission, during daily assessment and/or before any "as needed" intervention(s)   Evaluate if patient comfort function goal is met   Evaluate patient's satisfaction with pain management progress   Offer non-pharmacological pain management interventions     Problem: Side Effects from Pain Analgesia  Goal: Patient will experience minimal side effects of analgesic therapy  Outcome: Progressing     Problem: Discharge Barriers  Goal: Patient will be discharged home or other facility with appropriate resources  Outcome: Progressing     Problem: Psychosocial and Spiritual Needs  Goal: Demonstrates ability to cope with hospitalization/illness  Outcome: Progressing     Problem: Inadequate Airway Clearance  Goal: Patent Airway maintained  Outcome: Progressing      Problem: Infection Prevention  Goal: Free from infection  Outcome: Progressing     Problem: Constipation  Goal: Fluid and electrolyte balance are achieved/maintained  Outcome: Progressing  Flowsheets (Taken 05/26/2021 0347 by Hermelinda Dellen, RN)  Fluid and electrolyte balance are achieved/maintained:   Observe for seizure activity and initiate seizure precautions if indicated   Provide adequate hydration   Monitor intake and output every shift     Problem: Moderate/High Fall Risk Score >5  Goal: Patient will remain free of falls  Outcome: Progressing     Problem: Compromised Tissue integrity  Goal: Damaged tissue is healing and protected  Outcome: Progressing  Flowsheets (Taken 05/25/2021 1803 by Allena Napoleon, RN)  Damaged tissue is healing and protected:   Monitor/assess Braden scale every shift   Increase activity as tolerated/progressive mobility   Provide wound care per wound care algorithm  Goal: Nutritional status is improving  Outcome: Progressing

## 2021-05-27 NOTE — Progress Notes (Signed)
MEDICINE PROGRESS NOTE  [facility], DIVISION OF HOSPITALIST MEDICINE   [facility]   [place] Pager: [phone]  Patient: Emily [name]   MRN: [id number]     Active Hospital Problems    Diagnosis    Ischemic colon    History of colonic polyps    AKI (acute kidney injury)    Acute colitis    Mixed hyperlipidemia    Hypertension      A 75 y.o. female with PMH of breast cancer status post right breast mastectomy, GERD, hypertension, hypothyroidism who presented with acute abdominal pain and was found to have right-sided colitis.     Assessment /Plan  Acute ischemic colitis, Status post exploratory laparotomy and right hemicolectomy 05/23/2021  Leukocytosis improving  Follow-up blood culture, negative to date  Surgery follow-up, Continue wound VAC  Presently n.p.o., some flatus today, mild abdominal discomfort.  Continue IV fluids and Zosyn  Encourage ambulation  Follow recommendations from surgery team    Anemia of chronic disease, stable  Monitor CBC    Deconditioning due to generalized weakness  Activity as tolerated  PT/OT-recommendations for skilled nursing facility    Essential hypertension  Continue amlodipine and lisinopril     Hypothyroidism  Continue Synthroid     Hyperlipidemia  Continue Crestor    acetaminophen, 650 mg, 4 times per day  amLODIPine, 10 mg, Daily  docusate sodium, 200 mg, Daily  gabapentin, 100 mg, Q8H SCH  heparin (porcine), 5,000 Units, Q8H SCH  heparin (porcine), 5,000 Units, Pre-Op  levothyroxine, 25 mcg, QAM  lisinopril, 40 mg, Daily  piperacillin-tazobactam, 4.5 g, Q8H  rosuvastatin, 20 mg, Daily    glucagon (rDNA), 1 mg, PRN   And  dextrose, 250 mL, PRN   And  dextrose, 25 g, PRN   And  dextrose, 25 g, PRN  traMADol, 50 mg, Q6H PRN   Or  HYDROmorphone, 0.4 mg, Q6H PRN  magnesium sulfate, 1 g, PRN  meclizine, 25 mg, TID PRN  melatonin, 3 mg, QHS PRN  naloxone, 0.2 mg, PRN  potassium & sodium phosphates, 2 packet, PRN  potassium chloride, 0-40 mEq, PRN   And  potassium  chloride, 10 mEq, PRN     dextrose 5 % and 0.45% NaCl 100 mL/hr at 05/26/21 2330        Diagnoses, management and prognosis has been discussed with the patient.  I have spent 30 minutes in direct patient care and coordination of treatment; including reviewing chart, labs, Vitals, placing orders, d/w attending RN, ICM, and charting extensive complicated medical hx etc.     Subjective/ROS:   Patient had little bit of flatus today.  No nausea or vomiting.  Mild abdominal discomfort.  No chest pain or shortness of breath.  All other systems were reviewed and were negative.     Physical Exam:  Temp:  [98.1 F (36.7 C)-99.1 F (37.3 C)] 99 F (37.2 C)  Heart Rate:  [75-87] 87  Resp Rate:  [16-18] 17  BP: (122-145)/(56-76) 145/70  Intake and Output Summary-last 24 Hrs:  I/O last 3 completed shifts:  In: 60 [P.O.:60]  Out: 2575 [Urine:2575]  O2 Flow Rate (L/min)  Avg: 2 L/min  Min: 2 L/min  Max: 2 L/min by O2 Device: None (Room air)  General: Chronic ill appearance, nodistress.  HEENT: Normocephalic, sclera anicteric, pallor.  Neck: Supple, No JVD.  Respiratory: Clear bilaterally  Cardiovascular: S1 S2  Abdominal: Soft, wound VAC in place, mildly tender, no rebound or rigidity, BS  positive.  Extremities: No cyanosis or edema.     I have reviewed the following pertinent result(s), including: all available   No results found.  No results for input(s): GLUCOSEWB in the last 24 hours.  Recent Labs   Lab 05/27/21  0550 05/26/21  0628 05/25/21  0508 05/24/21  0515   Sodium 144 142 141 141   Potassium 3.3* 3.3* 3.7 3.9   Chloride 107 108 105 106   BUN 5.0* 7.0 7.0 7.0   Creatinine 0.7 0.8 0.8 0.9   EGFR >60.0 >60.0 >60.0 >60.0   Glucose 98 107* 100 152*   Calcium 8.4 8.2 8.1 8.4       Recent Labs   Lab 05/26/21  0628 05/25/21  0508 05/24/21  0515 05/23/21  0731   WBC 13.73* 19.04* 17.92* 20.59*   Hgb 8.9* 9.4* 9.6* 10.9*   Hematocrit 29.4* 31.2* 32.1* 36.7   Platelets 274 266 276 335       Recent Labs   Lab 05/22/21  2143   PT  12.1   PT INR 1.1       Recent Labs   Lab 05/22/21  1935   Alkaline Phosphatase 80   Bilirubin, Total 0.3   ALT 11   AST (SGOT) 23             Laretta Bolster, MD, FACP     This note was generated by the Epic EMR system/ Dragon speech recognition and may contain inherent errors or omissions not intended by the user. Grammatical errors, random word insertions, deletions, pronoun errors and incomplete sentences are occasional consequences of this technology due to software limitations. Not all errors are caught or corrected. If there are questions or concerns about the content of this note or information contained within the body of this dictation they should be addressed directly with the author for clarification.

## 2021-05-27 NOTE — Plan of Care (Signed)
Problem: Safety  Goal: Patient will be free from injury during hospitalization  Outcome: Progressing  Flowsheets (Taken 05/27/2021 0449)  Patient will be free from injury during hospitalization:   Assess patient's risk for falls and implement fall prevention plan of care per policy   Use appropriate transfer methods   Provide and maintain safe environment   Ensure appropriate safety devices are available at the bedside   Hourly rounding   Include patient/ family/ care giver in decisions related to safety     Problem: Safety  Goal: Patient will be free from infection during hospitalization  Outcome: Progressing  Flowsheets (Taken 05/27/2021 0449)  Free from Infection during hospitalization:   Assess and monitor for signs and symptoms of infection   Monitor lab/diagnostic results   Monitor all insertion sites (i.e. indwelling lines, tubes, urinary catheters, and drains)   Encourage patient and family to use good hand hygiene technique     Problem: Pain  Goal: Pain at adequate level as identified by patient  Outcome: Progressing  Flowsheets (Taken 05/27/2021 0449)  Pain at adequate level as identified by patient:   Identify patient comfort function goal   Assess pain on admission, during daily assessment and/or before any "as needed" intervention(s)   Evaluate if patient comfort function goal is met   Evaluate patient's satisfaction with pain management progress   Offer non-pharmacological pain management interventions

## 2021-05-27 NOTE — UM Notes (Signed)
DOS: 05/25/21    Per Surgery note:    "Date Time: 05/25/21 9:36 AM  Patient Name: Emily [name],Emily [name]        ASSESSMENT:   3 Days Post-Op S/P Procedure(s):  LAPAROSCOPY, DIAGNOSTIC  EXPLORATORY LAPAROTOMY  LAPAROTOMY, COLECTOMY, RIGHT  Reactive leukocytosis  Tmax 100.8, WBC 19k     PLAN:   -Await bowel function prior to advancing diet. Maintain NPO, IVF  -Encourage ambulation, PT/OT, IS  -Pain control  -Continue abx..."          DOS: 05/26/21    Per Medicine note    "Assessment /Plan  Acute ischemic colitis, Status post exploratory laparotomy and right hemicolectomy 05/23/2021  Leukocytosis improving  Follow-up blood culture, negative to date  Surgery follow-up, Continue wound VAC  Keep n.p.o.  Continue IV fluids and Zosyn  Out of bed, advised to walk     Anemia of chronic disease, stable  Monitor CBC     Deconditioning due to generalized weakness  Activity as tolerated  Consult PT and OT recommended skilled nursing facility when medically ready for discharge..."      WBC: 19.04--> 13.73  Potassium: 3.3  H/H: 8.9/29  Temp: 99  Zosyn 4.5g IV q 8hrs  IVF at 152ml/hr            Isac Caddy.  UR Case Manager, [phone]  [employer]  [address]   [place] [address]   [place] [address]

## 2021-05-27 NOTE — Progress Notes (Signed)
Emily [name] MRN: [id number]  75 y.o.  female    NUTRITION:  Reason for assessment: NPO/CLD    Intervention:  Diet advancement per MD     Assessment   Pt is a 75 y.o. female w/ a PMHx of HLD, HTN, and GERD admitted for ischemic colon. Per MD note, NPO continues until flatus. Per interview, pt consumes 2 meals/day and takes a MVI. She had a BM today per RN. Labs reviewed w/ low phosphorus and potassium noted. If NPO continues consider nutrition support w/ thiamin for refeeding risk if medically appropriate. Will continue to monitor.    Past Medical History:   Diagnosis Date    Breast cancer 2014    right breast mastectomy    Exercise-induced angina     negative work up    GERD (gastroesophageal reflux disease)     Headache     resolved    Hyperlipidemia     Hypertension     Hypothyroidism     Low back pain     Malignant neoplasm of overlapping sites of right female breast 10/19/2015    Syncope and collapse     Chronic Vertigo    Vertigo      Wt Readings from Last 30 Encounters:   05/23/21 70 kg (154 lb 5.2 oz)   04/21/21 70 kg (154 lb 5.2 oz)   04/02/21 69.8 kg (153 lb 12.8 oz)   03/31/21 69.3 kg (152 lb 12.8 oz)   03/09/21 69.5 kg (153 lb 3.2 oz)   03/04/21 68 kg (150 lb)   02/23/21 70.7 kg (155 lb 12.8 oz)   01/03/21 70 kg (154 lb 5.2 oz)   11/24/20 72.2 kg (159 lb 2.8 oz)   11/19/20 72.2 kg (159 lb 3.2 oz)   10/20/20 72.6 kg (160 lb)   09/27/20 70 kg (154 lb 6.4 oz)   08/31/20 72.6 kg (160 lb)   08/30/20 69.7 kg (153 lb 10.6 oz)   07/14/20 68.9 kg (152 lb)   06/25/20 70.5 kg (155 lb 6.4 oz)   05/28/20 71.9 kg (158 lb 8.2 oz)   05/12/20 68.9 kg (151 lb 14.4 oz)   05/10/20 64 kg (141 lb 1.5 oz)   03/11/20 71.2 kg (156 lb 15.5 oz)   03/08/20 71.2 kg (156 lb 15.5 oz)   02/27/20 72.1 kg (159 lb)   01/27/20 71.6 kg (157 lb 12.8 oz)   01/27/20 75.7 kg (166 lb 14.2 oz)   12/28/19 71.5 kg (157 lb 10.1 oz)   12/10/19 71.7 kg (158 lb)   08/14/19 68.6 kg (151 lb 3.2 oz)   08/08/19 68 kg (150 lb)   07/01/19 69.4 kg (153 lb)    04/15/19 65.5 kg (144 lb 8 oz)   Weights reviewed and stable    Social History     Socioeconomic History    Marital status: Widowed   Tobacco Use    Smoking status: Never    Smokeless tobacco: Never   Vaping Use    Vaping Use: Never used   Substance and Sexual Activity    Alcohol use: Not Currently     Comment: occ    Drug use: No    Sexual activity: Not Currently       Active Hospital Problems    Diagnosis    Ischemic colon    History of colonic polyps    AKI (acute kidney injury)    Acute colitis    Mixed hyperlipidemia    Hypertension  Allergies   Allergen Reactions    Morphine Itching    Morphine And Related Itching and Nausea And Vomiting     dizzy     GI Symptoms: WDL, per RN documentation   Skin: WDL, per RN documentation  NFPE:  Head: no overt s/s subcutaneous muscle or fat loss  Upper Body: no overt s/s subcutaneous muscle or fat loss  Lower Body: no s/s subcutaneous fat or muscle loss    Orders Placed This Encounter   Procedures    Diet NPO effective now Except for: SIPS WITH MEDS      Current Meds:  acetaminophen, 650 mg, 4 times per day  amLODIPine, 10 mg, Daily  docusate sodium, 200 mg, Daily  gabapentin, 100 mg, Q8H SCH  heparin (porcine), 5,000 Units, Q8H SCH  heparin (porcine), 5,000 Units, Pre-Op  levothyroxine, 25 mcg, QAM  lisinopril, 40 mg, Daily  piperacillin-tazobactam, 4.5 g, Q8H  rosuvastatin, 20 mg, Daily     dextrose 5 % and 0.45% NaCl 100 mL/hr at 05/26/21 2330       Recent Labs:  Recent Labs   Lab 05/26/21  0628 05/25/21  0508 05/24/21  0515   WBC 13.73* 19.04* 17.92*   Hgb 8.9* 9.4* 9.6*   Hematocrit 29.4* 31.2* 32.1*   MCV 88.8 89.1 89.2   Platelets 274 266 276     Recent Labs   Lab 05/27/21  0550 05/26/21  0628 05/25/21  0508 05/24/21  0515 05/23/21  0731   Sodium 144 142 141 141 142   Potassium 3.3* 3.3* 3.7 3.9 4.6   Chloride 107 108 105 106 107   CO2 29 29 25 26 23    BUN 5.0* 7.0 7.0 7.0 12.0   Creatinine 0.7 0.8 0.8 0.9 1.0   Glucose 98 107* 100 152* 188*   Calcium 8.4 8.2 8.1  8.4 8.9   Magnesium 1.9  --   --   --   --    Phosphorus 2.1*  --   --   --   --    EGFR >60.0 >60.0 >60.0 >60.0 >60.0     Recent Labs   Lab 05/27/21  0550 05/22/21  1935   Albumin 2.5* 4.1       Intake/Output Summary (Last 24 hours) at 05/27/2021 1444  Last data filed at 05/27/2021 0600  Gross per 24 hour   Intake 60 ml   Output 875 ml   Net -815 ml         Anthropometrics  Height: 160 cm (5\' 3" )  Weight: 70 kg (154 lb 5.2 oz)  Weight Change: 0  BMI (calculated): 27.4    Estimated Nutrition Needs:  Estimated Energy Needs  Total Energy Estimated Needs: 1700-1800  Method for Estimating Needs: 25-26 kcal/kg CBW 70 kg    Estimated Protein Needs  Total Protein Estimated Needs: 70-84 g  Method for Estimating Needs: 1.0-1.2 g/kg CBW 70 kg    Fluid Needs  Total Fluid Estimated Needs: 1750 mL or per MD  Method for Estimating Needs: 25 mL/kg CBW 70 kg or per MD      Learning & Discharge Planning Needs:None  Religious/Cultural Food Practices:None    Nutrition Diagnosis:   Inadequate oral intake related to acuity of illness as evidenced by pt NPO/CLD      Goals:  Diet advancement per MD by next visit    M/E:  Monitor:diet order, GI, meds and labs  Follow up:1-4 days    Alba Cory, RD  [phone]

## 2021-05-28 LAB — RENAL FUNCTION PANEL
Albumin: 2.5 g/dL — ABNORMAL LOW (ref 3.5–5.0)
Anion Gap: 10 (ref 5.0–15.0)
BUN: 4 mg/dL — ABNORMAL LOW (ref 7.0–19.0)
CO2: 27 mEq/L (ref 22–29)
Calcium: 8.2 mg/dL (ref 7.9–10.2)
Chloride: 106 mEq/L (ref 100–111)
Creatinine: 0.8 mg/dL (ref 0.6–1.0)
Glucose: 113 mg/dL — ABNORMAL HIGH (ref 70–100)
Phosphorus: 2.3 mg/dL (ref 2.3–4.7)
Potassium: 3.7 mEq/L (ref 3.5–5.1)
Sodium: 143 mEq/L (ref 136–145)

## 2021-05-28 LAB — COMPREHENSIVE METABOLIC PANEL
ALT: 20 U/L (ref 0–55)
AST (SGOT): 31 U/L (ref 5–34)
Albumin/Globulin Ratio: 0.9 (ref 0.9–2.2)
Albumin: 2.5 g/dL — ABNORMAL LOW (ref 3.5–5.0)
Alkaline Phosphatase: 217 U/L — ABNORMAL HIGH (ref 37–117)
Anion Gap: 9 (ref 5.0–15.0)
BUN: 4 mg/dL — ABNORMAL LOW (ref 7.0–19.0)
Bilirubin, Total: 0.2 mg/dL (ref 0.2–1.2)
CO2: 27 mEq/L (ref 22–29)
Calcium: 8 mg/dL (ref 7.9–10.2)
Chloride: 107 mEq/L (ref 100–111)
Creatinine: 0.8 mg/dL (ref 0.6–1.0)
Globulin: 2.8 g/dL (ref 2.0–3.6)
Glucose: 118 mg/dL — ABNORMAL HIGH (ref 70–100)
Potassium: 3.6 mEq/L (ref 3.5–5.1)
Protein, Total: 5.3 g/dL — ABNORMAL LOW (ref 6.0–8.3)
Sodium: 143 mEq/L (ref 136–145)

## 2021-05-28 LAB — CBC AND DIFFERENTIAL
Absolute NRBC: 0 10*3/uL (ref 0.00–0.00)
Basophils Absolute Automated: 0.03 10*3/uL (ref 0.00–0.08)
Basophils Automated: 0.3 %
Eosinophils Absolute Automated: 0.13 10*3/uL (ref 0.00–0.44)
Eosinophils Automated: 1.3 %
Hematocrit: 28.9 % — ABNORMAL LOW (ref 34.7–43.7)
Hgb: 9 g/dL — ABNORMAL LOW (ref 11.4–14.8)
Immature Granulocytes Absolute: 0.12 10*3/uL — ABNORMAL HIGH (ref 0.00–0.07)
Immature Granulocytes: 1.2 %
Lymphocytes Absolute Automated: 1.5 10*3/uL (ref 0.42–3.22)
Lymphocytes Automated: 14.7 %
MCH: 26.8 pg (ref 25.1–33.5)
MCHC: 31.1 g/dL — ABNORMAL LOW (ref 31.5–35.8)
MCV: 86 fL (ref 78.0–96.0)
MPV: 10.6 fL (ref 8.9–12.5)
Monocytes Absolute Automated: 1.28 10*3/uL — ABNORMAL HIGH (ref 0.21–0.85)
Monocytes: 12.5 %
Neutrophils Absolute: 7.17 10*3/uL — ABNORMAL HIGH (ref 1.10–6.33)
Neutrophils: 70 %
Nucleated RBC: 0 /100 WBC (ref 0.0–0.0)
Platelets: 352 10*3/uL — ABNORMAL HIGH (ref 142–346)
RBC: 3.36 10*6/uL — ABNORMAL LOW (ref 3.90–5.10)
RDW: 13 % (ref 11–15)
WBC: 10.23 10*3/uL — ABNORMAL HIGH (ref 3.10–9.50)

## 2021-05-28 LAB — GFR
EGFR: >60
EGFR: >60

## 2021-05-28 NOTE — Progress Notes (Signed)
POST OPERATIVE PROGRESS NOTE  [facility]    Date Time: 05/28/21 1:22 PM  Patient Name: Emily [name], Emily [name]      ASSESSMENT:   6 Days Post-Op S/P Procedure(s):  LAPAROSCOPY, DIAGNOSTIC  EXPLORATORY LAPAROTOMY  LAPAROTOMY, COLECTOMY, RIGHT    Doing well  passing gas, some confusion, has sitter  No pain    PLAN:   Up in chair for meals  Clears today  Ensure  Continue heparin        SUBJECTIVE:   The patient is doing fairly well.  Post operative pain is well controlled with medications.  Current dietary status:  Diet clear liquid  Ensure Clear Quantity: A. One; Frequency: TID (3 times a day) with meals and tolerating well.  Flatus: yes. BM:  yes.  Additional complaints: none       OBJECTIVE:   Current Vitals:   Vitals:    05/28/21 1126   BP: 150/71   Pulse: 89   Resp: 18   Temp: 98.8 F (37.1 C)   SpO2: 94%       Intake and Output Summary (Last 24 hours):  I/O last 3 completed shifts:  In: 1690 [P.O.:90; I.V.:1100; IV Piggyback:500]  Out: 875 [Urine:875]    Labs:     Results       Procedure Component Value Units Date/Time    Comprehensive metabolic panel [[id number]]  (Abnormal) Collected: 05/28/21 1016    Specimen: Blood Updated: 05/28/21 1128     Glucose 118 mg/dL      BUN 4.0 mg/dL      Creatinine 0.8 mg/dL      Sodium 409 mEq/L      Potassium 3.6 mEq/L      Chloride 107 mEq/L      CO2 27 mEq/L      Calcium 8.0 mg/dL      Protein, Total 5.3 g/dL      Albumin 2.5 g/dL      AST (SGOT) 31 U/L      ALT 20 U/L      Alkaline Phosphatase 217 U/L      Bilirubin, Total 0.2 mg/dL      Globulin 2.8 g/dL      Albumin/Globulin Ratio 0.9     Anion Gap 9.0    GFR [[id number]] Collected: 05/28/21 1016     Updated: 05/28/21 1128     EGFR >60.0    CBC and differential [[id number]]  (Abnormal) Collected: 05/28/21 1016    Specimen: Blood Updated: 05/28/21 1029     WBC 10.23 x10 3/uL      Hgb 9.0 g/dL      Hematocrit 57.8 %      Platelets 352 x10 3/uL      RBC 3.36 x10 6/uL      MCV 86.0 fL      MCH 26.8 pg      MCHC 31.1 g/dL       RDW 13 %      MPV 10.6 fL      Neutrophils 70.0 %      Lymphocytes Automated 14.7 %      Monocytes 12.5 %      Eosinophils Automated 1.3 %      Basophils Automated 0.3 %      Immature Granulocytes 1.2 %      Nucleated RBC 0.0 /100 WBC      Neutrophils Absolute 7.17 x10 3/uL      Lymphocytes Absolute Automated 1.50 x10 3/uL  Monocytes Absolute Automated 1.28 x10 3/uL      Eosinophils Absolute Automated 0.13 x10 3/uL      Basophils Absolute Automated 0.03 x10 3/uL      Immature Granulocytes Absolute 0.12 x10 3/uL      Absolute NRBC 0.00 x10 3/uL     Renal function panel [[id number]]  (Abnormal) Collected: 05/28/21 0623    Specimen: Blood Updated: 05/28/21 0739     Glucose 113 mg/dL      Sodium 409 mEq/L      Potassium 3.7 mEq/L      Chloride 106 mEq/L      CO2 27 mEq/L      BUN 4.0 mg/dL      Calcium 8.2 mg/dL      Creatinine 0.8 mg/dL      Albumin 2.5 g/dL      Phosphorus 2.3 mg/dL      Anion Gap 81.1    GFR [[id number]] Collected: 05/28/21 0623     Updated: 05/28/21 0739     EGFR >60.0    Culture Blood Aerobic and Anaerobic [[id number]] Collected: 05/22/21 2005    Specimen: Blood, Venipuncture Updated: 05/28/21 0321    Narrative:      ORDER#: [id number]                                    ORDERED BY: Macky Lower  SOURCE: Blood, Venipuncture                          COLLECTED:  05/22/21 20:05  ANTIBIOTICS AT COLL.:                                RECEIVED :  05/23/21 00:58  Culture Blood Aerobic and Anaerobic        FINAL       05/28/21 03:21  05/28/21   No growth after 5 days of incubation.      Culture Blood Aerobic and Anaerobic [[id number]] Collected: 05/22/21 1935    Specimen: Arm from Blood, Venipuncture Updated: 05/28/21 0321    Narrative:      ORDER#: [id number]                                    ORDERED BY: Macky Lower  SOURCE: Blood, Venipuncture arm                      COLLECTED:  05/22/21 19:35  ANTIBIOTICS AT COLL.:                                RECEIVED :  05/23/21 00:58  Culture Blood Aerobic  and Anaerobic        FINAL       05/28/21 03:21  05/28/21   No growth after 5 days of incubation.              Rads:     Radiology Results (24 Hour)       ** No results found for the last 24 hours. **            Physical Exam:     Mental status - normal mood, behavior, speech, dress, motor activity, and  thought processes, alert to self and place  Chest - clear to auscultation, no wheezes, rales or rhonchi, symmetric air entry, no tachypnea, retractions or cyanosis  Heart - normal rate, regular rhythm, normal S1, S2, no murmurs, rubs, clicks or gallops  Abdomen - soft, nontender, nondistended, no masses or organomegaly  Wound - clean, dry, no drainage  Extremities - peripheral pulses normal, no pedal edema, no clubbing or cyanosis      Signed by: Delena Serve, MD

## 2021-05-28 NOTE — Progress Notes (Signed)
MEDICINE PROGRESS NOTE  [facility], DIVISION OF HOSPITALIST MEDICINE   [facility]   [place] Pager: [phone]  Patient: Emily [name]   MRN: [id number]     Active Hospital Problems    Diagnosis    Ischemic colon    History of colonic polyps    AKI (acute kidney injury)    Acute colitis    Mixed hyperlipidemia    Hypertension      A 75 y.o. female with PMH of breast cancer status post right breast mastectomy, GERD, hypertension, hypothyroidism who presented with acute abdominal pain and was found to have right-sided colitis.     Assessment /Plan  Acute ischemic colitis, Status post exploratory laparotomy and right hemicolectomy 05/23/2021  Leukocytosis improving  Follow-up blood culture, negative   Surgery follow-up, Continue wound VAC  Patient started on clears today since she is having flatus now.  Continue Zosyn  IV fluids can be discontinued when she is tolerating clears well  Encourage ambulation  Appreciate input from surgery team.    Anemia of chronic disease, stable  Monitor CBC    Deconditioning due to generalized weakness  Activity as tolerated  PT/OT-recommendations for skilled nursing facility    Essential hypertension  Continue amlodipine and lisinopril     Hypothyroidism  Continue Synthroid     Hyperlipidemia  Continue Crestor    Situational depression  --Patient was offered support and counseling  --She does not wish to see psychiatry at this time and states there is no previous history of depression.    Discussed with patient, RN at bedside    acetaminophen, 650 mg, 4 times per day  amLODIPine, 10 mg, Daily  docusate sodium, 200 mg, Daily  heparin (porcine), 5,000 Units, Q8H SCH  heparin (porcine), 5,000 Units, Pre-Op  levothyroxine, 25 mcg, QAM  lisinopril, 40 mg, Daily  piperacillin-tazobactam, 4.5 g, Q8H  rosuvastatin, 20 mg, Daily    glucagon (rDNA), 1 mg, PRN   And  dextrose, 250 mL, PRN   And  dextrose, 25 g, PRN   And  dextrose, 25 g, PRN  traMADol, 50 mg, Q6H PRN    Or  HYDROmorphone, 0.4 mg, Q6H PRN  magnesium sulfate, 1 g, PRN  meclizine, 25 mg, TID PRN  melatonin, 3 mg, QHS PRN  naloxone, 0.2 mg, PRN  potassium & sodium phosphates, 2 packet, PRN  potassium chloride, 0-40 mEq, PRN   And  potassium chloride, 10 mEq, PRN     dextrose 5 % and 0.45% NaCl 50 mL/hr at 05/28/21 1349        Diagnoses, management and prognosis has been discussed with the patient.  I have spent 30 minutes in direct patient care and coordination of treatment; including reviewing chart, labs, Vitals, placing orders, d/w attending RN, ICM, and charting extensive complicated medical hx etc.     Subjective/ROS:     Patient is asking about her ailment, "What is going on with me?"  Explained patient's disease process including ischemic colitis and surgery.  Patient seemed depressed, confirms that she is feeling depressed in the hospital.  She denies any previous history of depression.   no chest pain or shortness of breath.    All other systems were reviewed and were negative.     Physical Exam:  Temp:  [97.3 F (36.3 C)-99.1 F (37.3 C)] 99 F (37.2 C)  Heart Rate:  [77-96] 95  Resp Rate:  [15-18] 18  BP: (132-166)/(63-74) 166/68  Intake and Output Summary-last  24 Hrs:  I/O last 3 completed shifts:  In: 1690 [P.O.:90; I.V.:1100; IV Piggyback:500]  Out: 875 [Urine:875]  No data recorded by O2 Device: None (Room air)  General: Chronic ill appearance, nodistress.  HEENT: Normocephalic, sclera anicteric, pallor.  Neck: Supple, No JVD.  Respiratory: Clear bilaterally  Cardiovascular: S1 S2  Abdominal: Soft, wound VAC in place, mildly tender, no rebound or rigidity, BS positive.  Extremities: No cyanosis or edema.     I have reviewed the following pertinent result(s), including: all available   No results found.  No results for input(s): GLUCOSEWB in the last 24 hours.  Recent Labs   Lab 05/28/21  1016 05/28/21  0623 05/27/21  0550 05/26/21  0628   Sodium 143 143 144 142   Potassium 3.6 3.7 3.3* 3.3*   Chloride  107 106 107 108   BUN 4.0* 4.0* 5.0* 7.0   Creatinine 0.8 0.8 0.7 0.8   EGFR >60.0 >60.0 >60.0 >60.0   Glucose 118* 113* 98 107*   Calcium 8.0 8.2 8.4 8.2       Recent Labs   Lab 05/28/21  1016 05/26/21  0628 05/25/21  0508 05/24/21  0515   WBC 10.23* 13.73* 19.04* 17.92*   Hgb 9.0* 8.9* 9.4* 9.6*   Hematocrit 28.9* 29.4* 31.2* 32.1*   Platelets 352* 274 266 276       Recent Labs   Lab 05/22/21  2143   PT 12.1   PT INR 1.1       Recent Labs   Lab 05/28/21  1016 05/22/21  1935   Alkaline Phosphatase 217* 80   Bilirubin, Total 0.2 0.3   ALT 20 11   AST (SGOT) 31 23             Laretta Bolster, MD     This note was generated by the Epic EMR system/ Dragon speech recognition and may contain inherent errors or omissions not intended by the user. Grammatical errors, random word insertions, deletions, pronoun errors and incomplete sentences are occasional consequences of this technology due to software limitations. Not all errors are caught or corrected. If there are questions or concerns about the content of this note or information contained within the body of this dictation they should be addressed directly with the author for clarification.

## 2021-05-28 NOTE — Plan of Care (Signed)
Pt A&OX4. VSS on room air.   Prevena dressing compressed, intact, with no leak to midline abdomen.   CFG 3/10 met.  Tolerating clear liquid diet; denies n/v. BM today. Pt up for meals per order.  IS encouraged.   Call bell within reach. The Pt agrees to the care plan and goals.      Problem: Safety  Goal: Patient will be free from injury during hospitalization  Outcome: Progressing  Flowsheets (Taken 05/28/2021 1701)  Patient will be free from injury during hospitalization:   Assess patient's risk for falls and implement fall prevention plan of care per policy   Use appropriate transfer methods   Provide and maintain safe environment   Hourly rounding  Goal: Patient will be free from infection during hospitalization  Outcome: Progressing  Flowsheets (Taken 05/28/2021 1701)  Free from Infection during hospitalization:   Assess and monitor for signs and symptoms of infection   Monitor all insertion sites (i.e. indwelling lines, tubes, urinary catheters, and drains)   Encourage patient and family to use good hand hygiene technique   Monitor lab/diagnostic results     Problem: Pain  Goal: Pain at adequate level as identified by patient  Outcome: Progressing  Flowsheets (Taken 05/28/2021 1701)  Pain at adequate level as identified by patient:   Identify patient comfort function goal   Assess for risk of opioid induced respiratory depression, including snoring/sleep apnea. Alert healthcare team of risk factors identified.   Assess pain on admission, during daily assessment and/or before any "as needed" intervention(s)   Evaluate if patient comfort function goal is met   Reassess pain within 30-60 minutes of any procedure/intervention, per Pain Assessment, Intervention, Reassessment (AIR) Cycle     Problem: Side Effects from Pain Analgesia  Goal: Patient will experience minimal side effects of analgesic therapy  Outcome: Progressing  Flowsheets (Taken 05/28/2021 1701)  Patient will experience minimal side effects of analgesic  therapy:   Monitor/assess patient's respiratory status (RR depth, effort, breath sounds)   Prevent/manage side effects per LIP orders (i.e. nausea, vomiting, pruritus, constipation, urinary retention, etc.)   Assess for changes in cognitive function   Evaluate for opioid-induced sedation with appropriate assessment tool (i.e. POSS)     Problem: Discharge Barriers  Goal: Patient will be discharged home or other facility with appropriate resources  Outcome: Progressing  Flowsheets (Taken 05/28/2021 1701)  Discharge to home or other facility with appropriate resources: Provide appropriate patient education     Problem: Psychosocial and Spiritual Needs  Goal: Demonstrates ability to cope with hospitalization/illness  Outcome: Progressing  Flowsheets (Taken 05/28/2021 1701)  Demonstrates ability to cope with hospitalizations/illness:   Encourage verbalization of feelings/concerns/expectations   Provide quiet environment     Problem: Inadequate Airway Clearance  Goal: Patent Airway maintained  Outcome: Progressing  Flowsheets (Taken 05/28/2021 1701)  Patent airway maintained:   Position patient for maximum ventilatory efficiency   Reinforce use of ordered respiratory interventions (i.e. CPAP, BiPAP, Incentive Spirometer, Acapella, etc.)     Problem: Infection Prevention  Goal: Free from infection  Outcome: Progressing  Flowsheets (Taken 05/28/2021 1701)  Free from infection:   Monitor/assess vital signs   Encourage/assist patient to turn, cough and perform deep breathing every 2 hours     Problem: Constipation  Goal: Fluid and electrolyte balance are achieved/maintained  Outcome: Progressing  Flowsheets (Taken 05/28/2021 1701)  Fluid and electrolyte balance are achieved/maintained:   Monitor intake and output every shift   Provide adequate hydration   Monitor/assess lab  values and report abnormal values     Problem: Moderate/High Fall Risk Score >5  Goal: Patient will remain free of falls  Outcome: Progressing  Flowsheets  (Taken 05/28/2021 0835)  High (Greater than 13):   HIGH-Consider use of low bed   HIGH-Apply yellow "Fall Risk" arm band   HIGH-Bed alarm on at all times while patient in bed   MOD-Place Fall Risk level on whiteboard in room   MOD-Request PT/OT consult order for patients with gait/mobility impairment   MOD-Perform dangle, stand, walk (DSW) prior to mobilization   MOD-Use of assistive devices -Bedside Commode if appropriate   MOD-Remain with patient during toileting   LOW-Anticoagulation education for injury risk   LOW-Fall Interventions Appropriate for Low Fall Risk     Problem: Compromised Tissue integrity  Goal: Damaged tissue is healing and protected  Outcome: Progressing  Flowsheets (Taken 05/28/2021 1701)  Damaged tissue is healing and protected:   Monitor/assess Braden scale every shift   Reposition patient every 2 hours and as needed unless able to reposition self   Keep intact skin clean and dry   Relieve pressure to bony prominences for patients at moderate and high risk   Increase activity as tolerated/progressive mobility  Goal: Nutritional status is improving  Outcome: Progressing  Flowsheets (Taken 05/28/2021 1701)  Nutritional status is improving: Allow adequate time for meals

## 2021-05-28 NOTE — Plan of Care (Signed)
[facility] willing to accept the pt pending vaccination status, neg covid-19  and authorization from her insurance company.  Patient and son Emily [name] aware. Spoke to [facility] in admissions [phone] ext [phone] , auth initiated .

## 2021-05-28 NOTE — Progress Notes (Signed)
Nutrition Follow-up    Intervention:  Ensure Clear TID w/ meals  This provides 720 kcal and 24 g of protein  RN to document po intake     Assessment   Pt's diet order was advanced to clear liquid on 8/26. Will send Ensure Clear TID to supplement clear liquid diet. Pt had flatus and BM on 8/26. Labs reviewed. Weights reviewed. Will continue to monitor.     Active Hospital Problems    Diagnosis    Ischemic colon    History of colonic polyps    AKI (acute kidney injury)    Acute colitis    Mixed hyperlipidemia    Hypertension       Orders Placed This Encounter   Procedures    Diet clear liquid       Current Meds:  acetaminophen, 650 mg, 4 times per day  amLODIPine, 10 mg, Daily  docusate sodium, 200 mg, Daily  gabapentin, 100 mg, Q8H SCH  heparin (porcine), 5,000 Units, Q8H SCH  heparin (porcine), 5,000 Units, Pre-Op  levothyroxine, 25 mcg, QAM  lisinopril, 40 mg, Daily  piperacillin-tazobactam, 4.5 g, Q8H  rosuvastatin, 20 mg, Daily       dextrose 5 % and 0.45% NaCl 100 mL/hr at 05/28/21 1232       Recent Labs:  Recent Labs   Lab 05/28/21  1016 05/26/21  0628 05/25/21  0508   WBC 10.23* 13.73* 19.04*   Hgb 9.0* 8.9* 9.4*   Hematocrit 28.9* 29.4* 31.2*   MCV 86.0 88.8 89.1   Platelets 352* 274 266     Recent Labs   Lab 05/28/21  1016 05/28/21  0623 05/27/21  0550 05/26/21  0628 05/25/21  0508   Sodium 143 143 144 142 141   Potassium 3.6 3.7 3.3* 3.3* 3.7   Chloride 107 106 107 108 105   CO2 27 27 29 29 25    BUN 4.0* 4.0* 5.0* 7.0 7.0   Creatinine 0.8 0.8 0.7 0.8 0.8   Glucose 118* 113* 98 107* 100   Calcium 8.0 8.2 8.4 8.2 8.1   Magnesium  --   --  1.9  --   --    Phosphorus  --  2.3 2.1*  --   --    EGFR >60.0 >60.0 >60.0 >60.0 >60.0     Recent Labs   Lab 05/28/21  1016 05/28/21  0623 05/27/21  0550   Albumin 2.5* 2.5* 2.5*         Intake/Output Summary (Last 24 hours) at 05/28/2021 1313  Last data filed at 05/28/2021 5784  Gross per 24 hour   Intake 1330 ml   Output --   Net 1330 ml       Anthropometrics  Height: 160  cm (5\' 3" )  Weight: 70 kg (154 lb 5.2 oz)  Weight Change: 0  BMI (calculated): 27.4    Estimated Nutrition Needs:  Estimated Energy Needs  Total Energy Estimated Needs: 1700-1800  Method for Estimating Needs: 25-26 kcal/kg CBW 70 kg    Estimated Protein Needs  Total Protein Estimated Needs: 70-84 g  Method for Estimating Needs: 1.0-1.2 g/kg CBW 70 kg    Fluid Needs  Total Fluid Estimated Needs: 1750 mL or per MD  Method for Estimating Needs: 25 mL/kg CBW 70 kg or per MD      Learning and Discharge Planning Needs:None    Nutrition Diagnosis:   Inadequate oral intake-continue    Goals:  Diet advancement per MD by next visit-met,  continue  Pt will consume at least 75% of meals by next visit     M/E:  Monitor:diet order, GI, meds and labs  Follow up:1-4 days     Alba Cory, RD  [phone]

## 2021-05-28 NOTE — UM Notes (Signed)
CSR:   8/26    Level of Care: Inpatient Floor        6 Days Post-Op S/P Procedure(s):  LAPAROSCOPY, DIAGNOSTIC  EXPLORATORY LAPAROTOMY  LAPAROTOMY, COLECTOMY, RIGHT          SUBJECTIVE:   The patient is doing fairly well.  Post operative pain is well controlled with medications.  Current dietary status:  Diet clear liquid  Ensure Clear Quantity: A. One; Frequency: TID (3 times a day) with meals and tolerating well.  Flatus: yes. BM:  yes.  Additional complaints: none        Doing well  passing gas, some confusion, has sitter  No pain        VS:  98.8, 89, 18, 150/71       Meds:  Zosyn 4.5 gm iv q 8 hr, IVF @ 50cc/hr       PLAN:   Up in chair for meals  Clears today  Ensure  Continue heparin       Plan:  [facility] willing to accept the pt pending vaccination status,         Kendrick Ranch RN,BSN   PRN Utilization Review   [facility]   [address]   [address]   [place], [place] [address]   [place]: [phone]    Fax: [phone]

## 2021-05-28 NOTE — Plan of Care (Addendum)
Patient is alert and oriented x 4, VSS, ON RA, may at times need oxygen with ambulation, SAT>92% tonight, can be forgetful at times, has been reoriented and assisted as needed, AVS in the room for a reminder not to get OOB and safety, ABD midline incision site with prevena wound vac and foam dressing C/D/I, C/o ABD/Back pain at times, medicated with Tylenol as ordered with good outcome, has PRN Tramadol when needed, NPO, IVF infusing, voiding adequately, Has been passing gas, had soft, medium BM tonight.  Safety precaution maintained, bed alarm on, rounded on frequently, has been participating with plan of care and agrees. Continue to monitor patient closely.     Problem: Safety  Goal: Patient will be free from injury during hospitalization  Outcome: Progressing  Flowsheets (Taken 05/28/2021 0234)  Patient will be free from injury during hospitalization:   Assess patient's risk for falls and implement fall prevention plan of care per policy   Provide and maintain safe environment   Use appropriate transfer methods   Ensure appropriate safety devices are available at the bedside   Hourly rounding  Goal: Patient will be free from infection during hospitalization  Outcome: Progressing  Flowsheets (Taken 05/28/2021 0234)  Free from Infection during hospitalization:   Assess and monitor for signs and symptoms of infection   Monitor lab/diagnostic results   Encourage patient and family to use good hand hygiene technique     Problem: Pain  Goal: Pain at adequate level as identified by patient  Outcome: Progressing  Flowsheets (Taken 05/28/2021 0234)  Pain at adequate level as identified by patient:   Identify patient comfort function goal   Evaluate if patient comfort function goal is met   Offer non-pharmacological pain management interventions     Problem: Side Effects from Pain Analgesia  Goal: Patient will experience minimal side effects of analgesic therapy  Outcome: Progressing  Flowsheets (Taken 05/28/2021  0234)  Patient will experience minimal side effects of analgesic therapy:   Monitor/assess patient's respiratory status (RR depth, effort, breath sounds)   Assess for changes in cognitive function   Evaluate for opioid-induced sedation with appropriate assessment tool (i.e. POSS)     Problem: Discharge Barriers  Goal: Patient will be discharged home or other facility with appropriate resources  Outcome: Progressing  Flowsheets (Taken 05/28/2021 0234)  Discharge to home or other facility with appropriate resources: Provide appropriate patient education     Problem: Psychosocial and Spiritual Needs  Goal: Demonstrates ability to cope with hospitalization/illness  Outcome: Progressing  Flowsheets (Taken 05/28/2021 0234)  Demonstrates ability to cope with hospitalizations/illness:   Encourage verbalization of feelings/concerns/expectations   Provide quiet environment   Assist patient to identify own strengths and abilities     Problem: Inadequate Airway Clearance  Goal: Patent Airway maintained  Outcome: Progressing     Problem: Infection Prevention  Goal: Free from infection  Outcome: Progressing  Flowsheets (Taken 05/28/2021 0234)  Free from infection:   Monitor/assess vital signs   Encourage/assist patient to turn, cough and perform deep breathing every 2 hours   Monitor/assess output from surgical drain if present   Monitor/assess lab values and report abnormal values   Assess for signs and symptoms of infection     Problem: Constipation  Goal: Fluid and electrolyte balance are achieved/maintained  Outcome: Progressing  Flowsheets (Taken 05/28/2021 0234)  Fluid and electrolyte balance are achieved/maintained:   Monitor/assess lab values and report abnormal values   Provide adequate hydration     Problem: Moderate/High Fall  Risk Score >5  Goal: Patient will remain free of falls  Outcome: Progressing     Problem: Compromised Tissue integrity  Goal: Damaged tissue is healing and protected  Outcome: Progressing  Flowsheets  (Taken 05/28/2021 0234)  Damaged tissue is healing and protected: Keep intact skin clean and dry  Goal: Nutritional status is improving  Outcome: Progressing

## 2021-05-28 NOTE — Plan of Care (Addendum)
Pt refused SNF  recommendation  earlier , wanted to d/c  with [facility] and now considering  [facility] if bed is available. Son Dorma Russell aware and in agreement . Referral placed at the above and others. Awaiting for their response.

## 2021-05-28 NOTE — Plan of Care (Addendum)
Call received from [facility] indicating that Emily [name] is received and pt can admit to the facility if medically cleared tomorrow. Son Dorma Russell aware and would like CM to call if transferring.

## 2021-05-29 LAB — RENAL FUNCTION PANEL
Albumin: 2.5 g/dL — ABNORMAL LOW (ref 3.5–5.0)
Anion Gap: 9 (ref 5.0–15.0)
BUN: 4 mg/dL — ABNORMAL LOW (ref 7.0–19.0)
CO2: 28 mEq/L (ref 22–29)
Calcium: 8.2 mg/dL (ref 7.9–10.2)
Chloride: 109 mEq/L (ref 100–111)
Creatinine: 0.7 mg/dL (ref 0.6–1.0)
Glucose: 132 mg/dL — ABNORMAL HIGH (ref 70–100)
Phosphorus: 2.5 mg/dL (ref 2.3–4.7)
Potassium: 3.4 mEq/L — ABNORMAL LOW (ref 3.5–5.1)
Sodium: 146 mEq/L — ABNORMAL HIGH (ref 136–145)

## 2021-05-29 LAB — GFR: EGFR: >60

## 2021-05-29 MED ORDER — PSYLLIUM 3.4 G PO PACK (WRAP)
1.0000 | PACK | Freq: Three times a day (TID) | ORAL | Status: DC
Start: 2021-05-29 — End: 2021-05-31
  Filled 2021-05-29 (×5): qty 1

## 2021-05-29 NOTE — PT Progress Note (Signed)
[facility]  [address]  [place] [address]  [phone]    Physical Therapy Treatment    Patient:  Emily [name]        MRN#:  [id number]  Unit:  [facility] 4C        Room/Bed:  Q034/V425.95    Medical Diagnosis: Small bowel ischemia [K55.9]  Right lower quadrant abdominal pain [R10.31]    Time of treatment:  PT Received On: 05/29/21  Start Time: 0928 Stop Time: 0957  Time Calculation (min): 29 min       Treatment #: PT Visit Number: 3/5    Patient's medical condition is appropriate for Physical Therapy intervention at this time.    Interpreter utilized: no, not indicated    Assessment   Pt in bed upon arrival and agreeable to therapy. Pt able to transfer supine to sit w/ SBA and stand w/ CGA and RW. Pt then ambulated 20' to bathroom and was able to sit w/ CGA. Pt able to toilet and perform pt hygiene w/ supervision. Pt able to tolerate standing at sink for  min w/ supervision to brush teethand wash her face. Pt stated that she was dizzy and needed to sit down, pt transferred to sitting in w/c w/ CGA. Symptoms subsided within a min and pt stated she was ready to walk back to bed. Pt able to stand and walk 20' back to bed w/ CGA. Pt stated she wanted to sit up in the chair. Pt transferred to sitting w/ CGA and RW. Pt left in chair with all needs met and call bell in reach. RN aware of pt status.  PMP - Progressive Mobility Protocol   PMP Activity: Step 6 - Walks in Room  Distance Walked (ft) (Step 6,7): 20 Feet     Plan   Based on today's performance:  Discharge Recommendation: SNF   DME Recommended for Discharge: Front wheel walker     If recommended d/c disposition is not available, patient will need Home w/ 24/7 sueprv, HHPT, HHOT.  Transport Recommendations: wheelchair van/transport    Discharge recommendations are based on patient's progression/regression. Please see most recent note for updated discharge recommendations.    Continue plan of  care.    Interdisciplinary Communication: RN      Subjective   "I want to go to the bathroom"  Patient is agreeable to participation in the therapy session. Nursing clears patient for therapy.  Pain: pt denies pain  Vitals:   Vitals:    05/29/21 0329 05/29/21 0432 05/29/21 0813 05/29/21 0818   BP: 159/72 149/74 148/72 148/72   Pulse: 89 89  83   Resp: 17   18   Temp: 99.1 F (37.3 C)   98.6 F (37 C)   TempSrc: Oral   Oral   SpO2: 93% 94%  94%   Weight:       Height:           Objective     Precautions/ Contraindications:   Precautions  Weight Bearing Status: no restrictions  Other Precautions: limb alert RUE    Patient is in bed with  Telemetry, PrimaFit (external female catheter), Intravenous (IV), and Wound Vac in place.        Functional Mobility:  Supine to Sit: SBA  Sit to Stand: CGA   Stand to Sit: CGA    Gait:   WB status: no restrictions  Assistive Device: RW  Assist Level: CGA  Distance:  20'  Pattern: decreased gait speed, wide BOS       Educated the patient to role of physical therapy, plan of care, goals  of therapy, rationale for progressing mobility and safety with mobility and ADLs, energy conservation techniques, and home safety.    RN notified of session outcome and that patient was left in chair with all needs met and equipment intact.   Safety measures include: handoff to nurse/clin tech/ unit secretary completed, oriented to call bell and placed within reach, personal items within reach, assistive device positioned out of reach, and bed placed in lowest position.   Mobility and ADL status posted at bedside and within E.M.R.    Goals per Eval/ Re-eval:   Goals  Goal Formulation: With patient  Time for Goal Acheivement: 5 visits  Pt Will Go Supine To Sit: with supervision  Pt Will Perform Sit To Supine: with supervision  Pt Will Perform Sit to Stand: with supervision  Pt Will Ambulate: > 200 feet, with supervision  Pt Will Go Up / Down Stairs: 6-10 stairs, with supervision        Therapist PPE  during session procedural mask and gloves     Signature:  Lennox Pippins, PT  05/29/2021 10:11 AM   Phone: [phone]     (For scheduling questions, please contact rehab tech [phone])

## 2021-05-29 NOTE — Progress Notes (Signed)
POST OPERATIVE PROGRESS NOTE  [facility]    Date Time: 05/29/21 11:35 AM  Patient Name: Emily [name], Emily [name]      ASSESSMENT:   7 Days Post-Op S/P Procedure(s):  LAPAROSCOPY, DIAGNOSTIC  EXPLORATORY LAPAROTOMY  LAPAROTOMY, COLECTOMY, RIGHT    Diarrhea overnight    PLAN:   Advance to low fiber diet  Add fiber powder with meals      SUBJECTIVE:   The patient is doing fairly well.  Post operative pain is well controlled with medications.  Current dietary status:  Ensure Clear Quantity: A. One; Frequency: TID (3 times a day) with meals  Diet low fiber and tolerating well.  Flatus: yes. BM:  yes.  Additional complaints: none       OBJECTIVE:   Current Vitals:   Vitals:    05/29/21 0818   BP: 148/72   Pulse: 83   Resp: 18   Temp: 98.6 F (37 C)   SpO2: 94%       Intake and Output Summary (Last 24 hours):  I/O last 3 completed shifts:  In: 1330 [P.O.:30; I.V.:1100; IV Piggyback:200]  Out: 0     Labs:     Results       Procedure Component Value Units Date/Time    Renal function panel [[id number]]  (Abnormal) Collected: 05/29/21 0859    Specimen: Blood Updated: 05/29/21 0934     Glucose 132 mg/dL      Sodium 409 mEq/L      Potassium 3.4 mEq/L      Chloride 109 mEq/L      CO2 28 mEq/L      BUN 4.0 mg/dL      Calcium 8.2 mg/dL      Creatinine 0.7 mg/dL      Albumin 2.5 g/dL      Phosphorus 2.5 mg/dL      Anion Gap 9.0    Narrative:      Rescheduled by [id number] at 05/29/2021 04:58 Reason: rn requested pt be   drawn last  Rescheduled by [id number] at 05/29/2021 06:13 Reason: Patient unavailable,   bathroom    GFR [[id number]] Collected: 05/29/21 0859     Updated: 05/29/21 0934     EGFR >60.0    Narrative:      Rescheduled by [id number] at 05/29/2021 04:58 Reason: rn requested pt be   drawn last  Rescheduled by [id number] at 05/29/2021 06:13 Reason: Patient unavailable,   bathroom            Rads:     Radiology Results (24 Hour)       ** No results found for the last 24 hours. **            Physical Exam:     Mental status - normal mood,  behavior, speech, dress, motor activity, and thought processes, alert to self and place  Chest - clear to auscultation, no wheezes, rales or rhonchi, symmetric air entry, no tachypnea, retractions or cyanosis  Heart - normal rate, regular rhythm, normal S1, S2, no murmurs, rubs, clicks or gallops  Abdomen - soft, nontender, nondistended, no masses or organomegaly  Wound - clean, dry, no drainage  Extremities - peripheral pulses normal, no pedal edema, no clubbing or cyanosis      Signed by: Velora Heckler, MD

## 2021-05-29 NOTE — Plan of Care (Addendum)
Patient is alert and oriented x 4, VSS, on RA, SAT>92%, has been monitored on Tele and continuous POX, NSR on the monitor, s/p Exploratory Laparotomy and R Hemicolectomy on 08/21, midline ABD incision site with prevena wound vac and foam dressing C/D/I, no drainage noted, C/o ABD/Back pain at times, medicated with Tylenol as ordered with good outcome, has PRN Tramadol when needed, CFG=3/10 and met, tolerating clear liquid diet, IVF infusing, voiding adequately, ABD tender to touch, +BS, passing gas, watery BM tonight. Has been ambulatory with minimal assist and walker.   Safety precaution maintained, bed alarm on, rounded on frequently, has been participating with plan of care and agrees. Continue to monitor patient closely.      Problem: Safety  Goal: Patient will be free from injury during hospitalization  Outcome: Progressing  Flowsheets (Taken 05/29/2021 0213)  Patient will be free from injury during hospitalization:   Assess patient's risk for falls and implement fall prevention plan of care per policy   Provide and maintain safe environment   Use appropriate transfer methods   Hourly rounding  Goal: Patient will be free from infection during hospitalization  Outcome: Progressing  Flowsheets (Taken 05/29/2021 0213)  Free from Infection during hospitalization:   Assess and monitor for signs and symptoms of infection   Monitor lab/diagnostic results   Encourage patient and family to use good hand hygiene technique     Problem: Pain  Goal: Pain at adequate level as identified by patient  Outcome: Progressing  Flowsheets (Taken 05/29/2021 0213)  Pain at adequate level as identified by patient:   Identify patient comfort function goal   Evaluate if patient comfort function goal is met     Problem: Side Effects from Pain Analgesia  Goal: Patient will experience minimal side effects of analgesic therapy  Outcome: Progressing  Flowsheets (Taken 05/29/2021 0213)  Patient will experience minimal side effects of analgesic  therapy:   Monitor/assess patient's respiratory status (RR depth, effort, breath sounds)   Assess for changes in cognitive function   Evaluate for opioid-induced sedation with appropriate assessment tool (i.e. POSS)     Problem: Discharge Barriers  Goal: Patient will be discharged home or other facility with appropriate resources  Outcome: Progressing  Flowsheets (Taken 05/29/2021 0213)  Discharge to home or other facility with appropriate resources: Provide appropriate patient education     Problem: Psychosocial and Spiritual Needs  Goal: Demonstrates ability to cope with hospitalization/illness  Outcome: Progressing  Flowsheets (Taken 05/29/2021 0213)  Demonstrates ability to cope with hospitalizations/illness:   Encourage verbalization of feelings/concerns/expectations   Provide quiet environment   Encourage patient to set small goals for self     Problem: Inadequate Airway Clearance  Goal: Patent Airway maintained  Outcome: Progressing  Flowsheets (Taken 05/29/2021 0213)  Patent airway maintained: Reinforce use of ordered respiratory interventions (i.e. CPAP, BiPAP, Incentive Spirometer, Acapella, etc.)     Problem: Infection Prevention  Goal: Free from infection  Outcome: Progressing  Flowsheets (Taken 05/29/2021 0213)  Free from infection:   Monitor/assess vital signs   Encourage/assist patient to turn, cough and perform deep breathing every 2 hours   Monitor/assess output from surgical drain if present   Assess incision for evidence of healing   Assess for signs and symptoms of infection   Monitor/assess lab values and report abnormal values     Problem: Constipation  Goal: Fluid and electrolyte balance are achieved/maintained  Outcome: Progressing  Flowsheets (Taken 05/29/2021 0213)  Fluid and electrolyte balance are achieved/maintained:  Monitor intake and output every shift   Monitor/assess lab values and report abnormal values   Provide adequate hydration     Problem: Moderate/High Fall Risk Score >5  Goal:  Patient will remain free of falls  Outcome: Progressing     Problem: Compromised Tissue integrity  Goal: Damaged tissue is healing and protected  Outcome: Progressing  Flowsheets (Taken 05/29/2021 0213)  Damaged tissue is healing and protected:   Keep intact skin clean and dry   Monitor/assess Braden scale every shift  Goal: Nutritional status is improving  Outcome: Progressing

## 2021-05-29 NOTE — Plan of Care (Signed)
Pt A&OX4. VSS on room air.   Prevena dressing compressed, intact, with no leak to midline abdomen.   CFG 3/10 met.  Tolerating full liquid diet; denies n/v. Multiple loose BMs today. Pt up for meals per order.  IS encouraged.   Call bell within reach. The Pt agrees to the care plan and goals.      Problem: Safety  Goal: Patient will be free from injury during hospitalization  Outcome: Progressing  Flowsheets (Taken 05/29/2021 1348)  Patient will be free from injury during hospitalization:   Assess patient's risk for falls and implement fall prevention plan of care per policy   Use appropriate transfer methods   Hourly rounding   Provide and maintain safe environment   Include patient/ family/ care giver in decisions related to safety  Goal: Patient will be free from infection during hospitalization  Outcome: Progressing  Flowsheets (Taken 05/29/2021 1348)  Free from Infection during hospitalization:   Assess and monitor for signs and symptoms of infection   Monitor lab/diagnostic results   Monitor all insertion sites (i.e. indwelling lines, tubes, urinary catheters, and drains)   Encourage patient and family to use good hand hygiene technique     Problem: Pain  Goal: Pain at adequate level as identified by patient  Outcome: Progressing  Flowsheets (Taken 05/29/2021 1348)  Pain at adequate level as identified by patient:   Identify patient comfort function goal   Assess for risk of opioid induced respiratory depression, including snoring/sleep apnea. Alert healthcare team of risk factors identified.   Assess pain on admission, during daily assessment and/or before any "as needed" intervention(s)   Reassess pain within 30-60 minutes of any procedure/intervention, per Pain Assessment, Intervention, Reassessment (AIR) Cycle   Evaluate if patient comfort function goal is met   Offer non-pharmacological pain management interventions     Problem: Side Effects from Pain Analgesia  Goal: Patient will experience minimal side  effects of analgesic therapy  Outcome: Progressing  Flowsheets (Taken 05/29/2021 1348)  Patient will experience minimal side effects of analgesic therapy:   Monitor/assess patient's respiratory status (RR depth, effort, breath sounds)   Prevent/manage side effects per LIP orders (i.e. nausea, vomiting, pruritus, constipation, urinary retention, etc.)   Assess for changes in cognitive function   Evaluate for opioid-induced sedation with appropriate assessment tool (i.e. POSS)     Problem: Discharge Barriers  Goal: Patient will be discharged home or other facility with appropriate resources  Outcome: Progressing  Flowsheets (Taken 05/29/2021 1348)  Discharge to home or other facility with appropriate resources: Provide appropriate patient education     Problem: Psychosocial and Spiritual Needs  Goal: Demonstrates ability to cope with hospitalization/illness  Outcome: Progressing  Flowsheets (Taken 05/29/2021 1348)  Demonstrates ability to cope with hospitalizations/illness:   Encourage verbalization of feelings/concerns/expectations   Provide quiet environment     Problem: Inadequate Airway Clearance  Goal: Patent Airway maintained  Outcome: Progressing  Flowsheets (Taken 05/29/2021 1348)  Patent airway maintained:   Position patient for maximum ventilatory efficiency   Provide adequate fluid intake to liquefy secretions   Reinforce use of ordered respiratory interventions (i.e. CPAP, BiPAP, Incentive Spirometer, Acapella, etc.)     Problem: Infection Prevention  Goal: Free from infection  Outcome: Progressing  Flowsheets (Taken 05/29/2021 1348)  Free from infection:   Monitor/assess vital signs   Encourage/assist patient to turn, cough and perform deep breathing every 2 hours     Problem: Constipation  Goal: Fluid and electrolyte balance are achieved/maintained  Outcome:  Progressing  Flowsheets (Taken 05/29/2021 1348)  Fluid and electrolyte balance are achieved/maintained:   Monitor intake and output every shift   Provide  adequate hydration     Problem: Moderate/High Fall Risk Score >5  Goal: Patient will remain free of falls  Outcome: Progressing  Flowsheets (Taken 05/29/2021 0810)  High (Greater than 13):   HIGH-Consider use of low bed   HIGH-Apply yellow "Fall Risk" arm band   HIGH-Bed alarm on at all times while patient in bed   MOD-Request PT/OT consult order for patients with gait/mobility impairment   MOD-Perform dangle, stand, walk (DSW) prior to mobilization   MOD-Use of assistive devices -Bedside Commode if appropriate   MOD-Remain with patient during toileting   LOW-Anticoagulation education for injury risk   LOW-Fall Interventions Appropriate for Low Fall Risk     Problem: Compromised Tissue integrity  Goal: Damaged tissue is healing and protected  Outcome: Progressing  Flowsheets (Taken 05/29/2021 1348)  Damaged tissue is healing and protected:   Monitor/assess Braden scale every shift   Reposition patient every 2 hours and as needed unless able to reposition self   Provide wound care per wound care algorithm   Keep intact skin clean and dry   Avoid shearing injuries  Goal: Nutritional status is improving  Outcome: Progressing  Flowsheets (Taken 05/29/2021 1348)  Nutritional status is improving: Allow adequate time for meals     Problem: Altered GI Function  Goal: Fluid and electrolyte balance are achieved/maintained  Outcome: Progressing  Flowsheets (Taken 05/29/2021 1348)  Fluid and electrolyte balance are achieved/maintained:   Monitor intake and output every shift   Provide adequate hydration  Goal: Elimination patterns are normal or improving  Outcome: Progressing  Flowsheets (Taken 05/29/2021 1348)  Elimination patterns are normal or improving:   Assess for normal bowel sounds   Monitor for abdominal distension   Monitor for abdominal discomfort

## 2021-05-29 NOTE — Progress Notes (Signed)
MEDICINE PROGRESS NOTE  [facility], DIVISION OF [facility]OSPITALIST MEDICINE   [facility]   [place] Pager: [phone]  Patient: Emily [name]   MRN: [id number]     Active Hospital Problems    Diagnosis    Ischemic colon    History of colonic polyps    AKI (acute kidney injury)    Acute colitis    Mixed hyperlipidemia    Hypertension      A 75 y.o. female with PMH of breast cancer status post right breast mastectomy, GERD, hypertension, hypothyroidism who presented with acute abdominal pain and was found to have right-sided colitis.     Assessment /Plan  Acute ischemic colitis, Status post exploratory laparotomy and right hemicolectomy 05/23/2021  Leukocytosis improving  Follow-up blood culture, negative   Surgery follow-up, Continue wound VAC  Patient tolerated clears, advancing to low-fiber diet  Continue Zosyn -since 8/2, will switch to oral antibiotics in 1 to 2 days  Appreciate input from surgery team.    Anemia of chronic disease, stable  Monitor CBC    Deconditioning due to generalized weakness  Activity as tolerated  PT/OT-recommendations for skilled nursing facility    Essential hypertension  Continue amlodipine and lisinopril     Hypothyroidism  Continue Synthroid     Hyperlipidemia  Continue Crestor    Situational depression  --Patient was offered support and counseling  --She does not wish to see psychiatry at this time and states there is no previous history of depression.    Disposition: Skilled nursing facility per PT/OT    Discussed with patient, RN at bedside  Patient's son and daughter-in-law at bedside.  Patient's daughter-in-law states that patient has been more forgetful lately, even prior to coming to the hospital.    acetaminophen, 650 mg, 4 times per day  amLODIPine, 10 mg, Daily  docusate sodium, 200 mg, Daily  heparin (porcine), 5,000 Units, Q8H SCH  heparin (porcine), 5,000 Units, Pre-Op  levothyroxine, 25 mcg, QAM  lisinopril, 40 mg, Daily  piperacillin-tazobactam, 4.5 g,  Q8H  psyllium, 1 packet, TID MEALS  rosuvastatin, 20 mg, Daily    glucagon (rDNA), 1 mg, PRN   And  dextrose, 250 mL, PRN   And  dextrose, 25 g, PRN   And  dextrose, 25 g, PRN  traMADol, 50 mg, Q6H PRN   Or  HYDROmorphone, 0.4 mg, Q6H PRN  magnesium sulfate, 1 g, PRN  meclizine, 25 mg, TID PRN  melatonin, 3 mg, QHS PRN  naloxone, 0.2 mg, PRN  potassium & sodium phosphates, 2 packet, PRN  potassium chloride, 0-40 mEq, PRN   And  potassium chloride, 10 mEq, PRN     dextrose 5 % and 0.45% NaCl 50 mL/hr at 05/29/21 1037        Diagnoses, management and prognosis has been discussed with the patient.  I have spent 30 minutes in direct patient care and coordination of treatment; including reviewing chart, labs, Vitals, placing orders, d/w attending RN, ICM, and charting extensive complicated medical hx etc.     Subjective/ROS:     Patient seems to be more engaged today while patient son was in the room.  Denies any chest pain or shortness of breath.  Tolerating clear liquids.      All other systems were reviewed and were negative.     Physical Exam:  Temp:  [98.4 F (36.9 C)-99.3 F (37.4 C)] 98.8 F (37.1 C)  Heart Rate:  [80-91] 80  Resp Rate:  [16-18]  17  BP: (142-168)/(60-76) 153/71  Intake and Output Summary-last 24 Hrs:  I/O last 3 completed shifts:  In: 1330 [P.O.:30; I.V.:1100; IV Piggyback:200]  Out: 0   No data recorded by O2 Device: None (Room air)  General: Chronic ill appearance, nodistress.  HEENT: Normocephalic, sclera anicteric, pallor.  Neck: Supple, No JVD.  Respiratory: Clear bilaterally  Cardiovascular: S1 S2  Abdominal: Soft, wound VAC in place, mildly tender, no rebound or rigidity, BS positive.  Extremities: No cyanosis or edema.     I have reviewed the following pertinent result(s), including: all available   No results found.  No results for input(s): GLUCOSEWB in the last 24 hours.  Recent Labs   Lab 05/29/21  0859 05/28/21  1016 05/28/21  0623 05/27/21  0550   Sodium 146* 143 143 144    Potassium 3.4* 3.6 3.7 3.3*   Chloride 109 107 106 107   BUN 4.0* 4.0* 4.0* 5.0*   Creatinine 0.7 0.8 0.8 0.7   EGFR >60.0 >60.0 >60.0 >60.0   Glucose 132* 118* 113* 98   Calcium 8.2 8.0 8.2 8.4       Recent Labs   Lab 05/28/21  1016 05/26/21  0628 05/25/21  0508 05/24/21  0515   WBC 10.23* 13.73* 19.04* 17.92*   Hgb 9.0* 8.9* 9.4* 9.6*   Hematocrit 28.9* 29.4* 31.2* 32.1*   Platelets 352* 274 266 276       Recent Labs   Lab 05/22/21  2143   PT 12.1   PT INR 1.1       Recent Labs   Lab 05/28/21  1016 05/22/21  1935   Alkaline Phosphatase 217* 80   Bilirubin, Total 0.2 0.3   ALT 20 11   AST (SGOT) 31 23             Laretta Bolster, MD     This note was generated by the Epic EMR system/ Dragon speech recognition and may contain inherent errors or omissions not intended by the user. Grammatical errors, random word insertions, deletions, pronoun errors and incomplete sentences are occasional consequences of this technology due to software limitations. Not all errors are caught or corrected. If there are questions or concerns about the content of this note or information contained within the body of this dictation they should be addressed directly with the author for clarification.

## 2021-05-30 DIAGNOSIS — E785 Hyperlipidemia, unspecified: Secondary | ICD-10-CM

## 2021-05-30 DIAGNOSIS — R531 Weakness: Secondary | ICD-10-CM

## 2021-05-30 DIAGNOSIS — E039 Hypothyroidism, unspecified: Secondary | ICD-10-CM

## 2021-05-30 NOTE — Progress Notes (Signed)
MEDICINE PROGRESS NOTE  [facility], DIVISION OF HOSPITAL[facility] MEDICINE   [facility]   [place] Pager: [phone]  Patient: Emily [name]   MRN: [id number]     Active Hospital Problems    Diagnosis    Ischemic colon    History of colonic polyps    AKI (acute kidney injury)    Acute colitis    Mixed hyperlipidemia    Hypertension      A 75 y.o. female with PMH of breast cancer status post right breast mastectomy, GERD, hypertension, hypothyroidism who presented with acute abdominal pain and was found to have right-sided colitis.     Assessment /Plan    # Acute ischemic colitis, Status post exploratory laparotomy and right hemicolectomy 05/23/2021  Leukocytosis improving - last checked 8/26 WBC was 10.   Follow-up blood culture, negative   Patient now on low-fiber diet, but states not hungry. Tolerated clears.  Continue Zosyn -since 8/2, consider switch to oral antibiotics once pt able to tolerate more food PO.  Appreciate input from surgery team    Anemia of chronic disease, stable  Monitor CBC    Deconditioning due to generalized weakness  Activity as tolerated  PT/OT-recommendations for skilled nursing facility    Essential hypertension  Continue amlodipine and lisinopril     Hypothyroidism  Continue Synthroid     Hyperlipidemia  Continue Crestor    Situational depression  --Patient was offered support and counseling  --She does not wish to see psychiatry at this time and states there is no previous history of depression.    Disposition: Skilled nursing facility per PT/OT    Discussed with patient, RN at bedside      acetaminophen, 650 mg, 4 times per day  amLODIPine, 10 mg, Daily  docusate sodium, 200 mg, Daily  heparin (porcine), 5,000 Units, Q8H SCH  heparin (porcine), 5,000 Units, Pre-Op  levothyroxine, 25 mcg, QAM  lisinopril, 40 mg, Daily  piperacillin-tazobactam, 4.5 g, Q8H  psyllium, 1 packet, TID MEALS  rosuvastatin, 20 mg, Daily    glucagon (rDNA), 1 mg, PRN   And  dextrose, 250 mL, PRN    And  dextrose, 25 g, PRN   And  dextrose, 25 g, PRN  traMADol, 50 mg, Q6H PRN   Or  HYDROmorphone, 0.4 mg, Q6H PRN  magnesium sulfate, 1 g, PRN  meclizine, 25 mg, TID PRN  melatonin, 3 mg, QHS PRN  naloxone, 0.2 mg, PRN  potassium & sodium phosphates, 2 packet, PRN  potassium chloride, 0-40 mEq, PRN   And  potassium chloride, 10 mEq, PRN        Subjective/ROS:     Pt states she is doing well, no pain although she continues to have multiple episodes of diarrhea. She states she is just not very hungry, only had tea for breakfast. No other complaints.    All other systems were reviewed and were negative.     Physical Exam:  Temp:  [98.2 F (36.8 C)-99.5 F (37.5 C)] 98.2 F (36.8 C)  Heart Rate:  [79-83] 79  Resp Rate:  [14-20] 16  BP: (134-162)/(66-77) 134/66  Intake and Output Summary-last 24 Hrs:  No intake/output data recorded.  No data recorded by O2 Device: None (Room air)    General: Chronic ill appearance, nodistress.  HEENT: Normocephalic, sclera anicteric, pallor.  Neck: Supple, No JVD.  Respiratory: Clear bilaterally  Cardiovascular: S1 S2  Abdominal: Soft, non tender today, no rebound or rigidity, BS positive.  Extremities:  No cyanosis or edema.     I have reviewed the following pertinent result(s), including: all available   No results found.  No results for input(s): GLUCOSEWB in the last 24 hours.  Recent Labs   Lab 05/29/21  0859 05/28/21  1016 05/28/21  0623 05/27/21  0550   Sodium 146* 143 143 144   Potassium 3.4* 3.6 3.7 3.3*   Chloride 109 107 106 107   BUN 4.0* 4.0* 4.0* 5.0*   Creatinine 0.7 0.8 0.8 0.7   EGFR >60.0 >60.0 >60.0 >60.0   Glucose 132* 118* 113* 98   Calcium 8.2 8.0 8.2 8.4       Recent Labs   Lab 05/28/21  1016 05/26/21  0628 05/25/21  0508 05/24/21  0515   WBC 10.23* 13.73* 19.04* 17.92*   Hgb 9.0* 8.9* 9.4* 9.6*   Hematocrit 28.9* 29.4* 31.2* 32.1*   Platelets 352* 274 266 276             Recent Labs   Lab 05/28/21  1016   Alkaline Phosphatase 217*   Bilirubin, Total 0.2   ALT  20   AST (SGOT) 31             Laren Everts, MD     This note was generated by the Epic EMR system/ Dragon speech recognition and may contain inherent errors or omissions not intended by the user. Grammatical errors, random word insertions, deletions, pronoun errors and incomplete sentences are occasional consequences of this technology due to software limitations. Not all errors are caught or corrected. If there are questions or concerns about the content of this note or information contained within the body of this dictation they should be addressed directly with the author for clarification.

## 2021-05-30 NOTE — Progress Notes (Signed)
POST OPERATIVE PROGRESS NOTE  [facility]    Date Time: 05/30/21 2:03 PM  Patient Name: Emily [name], Emily [name]      ASSESSMENT:   8 Days Post-Op S/P Procedure(s):  LAPAROSCOPY, DIAGNOSTIC  EXPLORATORY LAPAROTOMY  LAPAROTOMY, COLECTOMY, RIGHT    Diarrhea continues    PLAN:   Advance to low fiber diet; encourage ensure and soft foods  Add fiber powder with meals to help with diarrhea      SUBJECTIVE:   The patient is doing fairly well.  Post operative pain is well controlled with medications.  Current dietary status:  Ensure Clear Quantity: A. One; Frequency: TID (3 times a day) with meals  Diet low fiber and tolerating well.  Flatus: yes. BM:  yes.  Additional complaints: none       OBJECTIVE:   Current Vitals:   Vitals:    05/30/21 0920   BP: 134/66   Pulse:    Resp:    Temp:    SpO2:        Intake and Output Summary (Last 24 hours):  No intake/output data recorded.    Labs:     Results       ** No results found for the last 24 hours. **            Rads:     Radiology Results (24 Hour)       ** No results found for the last 24 hours. **            Physical Exam:     Mental status - normal mood, behavior, speech, dress, motor activity, and thought processes, alert to self and place  Chest - clear to auscultation, no wheezes, rales or rhonchi, symmetric air entry, no tachypnea, retractions or cyanosis  Heart - normal rate, regular rhythm, normal S1, S2, no murmurs, rubs, clicks or gallops  Abdomen - soft, nontender, nondistended, no masses or organomegaly  Wound - clean, dry, no drainage  Extremities - peripheral pulses normal, no pedal edema, no clubbing or cyanosis      Signed by: Velora Heckler, MD

## 2021-05-30 NOTE — Plan of Care (Signed)
Pt alert and oriented x4 get out of bed to use the restroom without calling for help in the morning because of urgency with bowel movements. Pt reoriented to the call lights and educated on safety precautions. Pt has poor appetite eat only 25% of meals but able to drink her ensure 100%. Pt stool softener held but refused metamucil MD notified. Will continue to ambulate pt and encourages pt to eat and drink po fluids.   Problem: Safety  Goal: Patient will be free from injury during hospitalization  Outcome: Progressing  Goal: Patient will be free from infection during hospitalization  Outcome: Progressing     Problem: Pain  Goal: Pain at adequate level as identified by patient  Outcome: Progressing     Problem: Side Effects from Pain Analgesia  Goal: Patient will experience minimal side effects of analgesic therapy  Outcome: Progressing     Problem: Discharge Barriers  Goal: Patient will be discharged home or other facility with appropriate resources  Outcome: Progressing     Problem: Psychosocial and Spiritual Needs  Goal: Demonstrates ability to cope with hospitalization/illness  Outcome: Progressing     Problem: Inadequate Airway Clearance  Goal: Patent Airway maintained  Outcome: Progressing     Problem: Infection Prevention  Goal: Free from infection  Outcome: Progressing     Problem: Constipation  Goal: Fluid and electrolyte balance are achieved/maintained  Outcome: Progressing     Problem: Moderate/High Fall Risk Score >5  Goal: Patient will remain free of falls  Outcome: Progressing     Problem: Compromised Tissue integrity  Goal: Damaged tissue is healing and protected  Outcome: Progressing  Goal: Nutritional status is improving  Outcome: Progressing     Problem: Altered GI Function  Goal: Fluid and electrolyte balance are achieved/maintained  Outcome: Progressing  Goal: Elimination patterns are normal or improving  Outcome: Progressing

## 2021-05-30 NOTE — Plan of Care (Signed)
Patient alert and oriented, vital signs stable on room air, full sensation, pulses palpable, abdominal dressing clean/dry/intact. Tolerating diet, voiding well producing adequate urine. IV antibiotic administered as ordered.    Pain goal 2/10. PRN Tramadol administered with good relief. Pt reports adequate pain control. Will continue to monitor.     The patient participates and agrees with the care plan and all precautions. Pt instructed to call for assistance before getting out of bed. Bed alarm on, call bell and overbed table within reach. Safety maintained. Will continue to monitor.      Problem: Safety  Goal: Patient will be free from injury during hospitalization  Outcome: Progressing  Flowsheets (Taken 05/30/2021 2308)  Patient will be free from injury during hospitalization:   Assess patient's risk for falls and implement fall prevention plan of care per policy   Provide and maintain safe environment   Use appropriate transfer methods   Ensure appropriate safety devices are available at the bedside   Include patient/ family/ care giver in decisions related to safety   Hourly rounding  Goal: Patient will be free from infection during hospitalization  Outcome: Progressing  Flowsheets (Taken 05/30/2021 2308)  Free from Infection during hospitalization:   Assess and monitor for signs and symptoms of infection   Monitor all insertion sites (i.e. indwelling lines, tubes, urinary catheters, and drains)   Monitor lab/diagnostic results   Encourage patient and family to use good hand hygiene technique     Problem: Pain  Goal: Pain at adequate level as identified by patient  Outcome: Progressing  Flowsheets (Taken 05/30/2021 2308)  Pain at adequate level as identified by patient:   Identify patient comfort function goal   Assess for risk of opioid induced respiratory depression, including snoring/sleep apnea. Alert healthcare team of risk factors identified.   Assess pain on admission, during daily assessment and/or  before any "as needed" intervention(s)   Reassess pain within 30-60 minutes of any procedure/intervention, per Pain Assessment, Intervention, Reassessment (AIR) Cycle   Evaluate if patient comfort function goal is met   Evaluate patient's satisfaction with pain management progress   Offer non-pharmacological pain management interventions     Problem: Side Effects from Pain Analgesia  Goal: Patient will experience minimal side effects of analgesic therapy  Outcome: Progressing  Flowsheets (Taken 05/30/2021 2308)  Patient will experience minimal side effects of analgesic therapy:   Monitor/assess patient's respiratory status (RR depth, effort, breath sounds)   Assess for changes in cognitive function   Prevent/manage side effects per LIP orders (i.e. nausea, vomiting, pruritus, constipation, urinary retention, etc.)   Evaluate for opioid-induced sedation with appropriate assessment tool (i.e. POSS)     Problem: Discharge Barriers  Goal: Patient will be discharged home or other facility with appropriate resources  Outcome: Progressing  Flowsheets (Taken 05/30/2021 2308)  Discharge to home or other facility with appropriate resources: Provide appropriate patient education     Problem: Psychosocial and Spiritual Needs  Goal: Demonstrates ability to cope with hospitalization/illness  Outcome: Progressing  Flowsheets (Taken 05/30/2021 2308)  Demonstrates ability to cope with hospitalizations/illness:   Encourage verbalization of feelings/concerns/expectations   Include patient/ patient care companion in decisions     Problem: Inadequate Airway Clearance  Goal: Patent Airway maintained  Outcome: Progressing  Flowsheets (Taken 05/30/2021 2308)  Patent airway maintained:   Provide adequate fluid intake to liquefy secretions   Reposition patient every 2 hours and as needed unless able to self-reposition   Position patient for maximum ventilatory efficiency  Reinforce use of ordered respiratory interventions (i.e. CPAP, BiPAP,  Incentive Spirometer, Acapella, etc.)     Problem: Infection Prevention  Goal: Free from infection  Outcome: Progressing  Flowsheets (Taken 05/30/2021 2308)  Free from infection:   Monitor/assess vital signs   Encourage/assist patient to turn, cough and perform deep breathing every 2 hours   Assess incision for evidence of healing   Monitor/assess output from surgical drain if present   Assess for signs and symptoms of infection   Monitor/assess lab values and report abnormal values     Problem: Constipation  Goal: Fluid and electrolyte balance are achieved/maintained  Outcome: Progressing  Flowsheets (Taken 05/30/2021 2308)  Fluid and electrolyte balance are achieved/maintained:   Monitor intake and output every shift   Monitor/assess lab values and report abnormal values   Provide adequate hydration   Assess and reassess fluid and electrolyte status     Problem: Moderate/High Fall Risk Score >5  Goal: Patient will remain free of falls  Outcome: Progressing  Flowsheets (Taken 05/30/2021 2200)  High (Greater than 13):   HIGH-Apply yellow "Fall Risk" arm band   HIGH-Bed alarm on at all times while patient in bed   MOD-Place Fall Risk level on whiteboard in room   MOD-Use of assistive devices -Bedside Commode if appropriate   MOD-Remain with patient during toileting     Problem: Altered GI Function  Goal: Fluid and electrolyte balance are achieved/maintained  Outcome: Progressing  Flowsheets (Taken 05/30/2021 2308)  Fluid and electrolyte balance are achieved/maintained:   Monitor intake and output every shift   Monitor/assess lab values and report abnormal values   Provide adequate hydration   Assess and reassess fluid and electrolyte status  Goal: Elimination patterns are normal or improving  Outcome: Progressing  Flowsheets (Taken 05/30/2021 2308)  Elimination patterns are normal or improving:   Report abnormal assessment to physician   Assess for normal bowel sounds   Monitor for abdominal discomfort   Administer  treatments as ordered   Assess for flatus   Anticipate/assist with toileting needs   Monitor for abdominal distension

## 2021-05-30 NOTE — Plan of Care (Signed)
Patient alert and oriented, vital signs stable on room air, full sensation, pulses palpable, Prevena dressing to midline abdominal incision compressed/intact/no leak. Voiding well producing adequate urine.    Pt reports adequate pain control. Will continue to monitor.     The patient participates and agrees with the care plan and all precautions. Pt instructed to call for assistance before getting out of bed. Bed alarm on, call bell and overbed table within reach. Safety maintained. Will continue to monitor.     Problem: Safety  Goal: Patient will be free from injury during hospitalization  Outcome: Progressing  Flowsheets (Taken 05/30/2021 0254)  Patient will be free from injury during hospitalization:   Assess patient's risk for falls and implement fall prevention plan of care per policy   Provide and maintain safe environment   Use appropriate transfer methods   Ensure appropriate safety devices are available at the bedside   Include patient/ family/ care giver in decisions related to safety   Hourly rounding  Goal: Patient will be free from infection during hospitalization  Outcome: Progressing  Flowsheets (Taken 05/30/2021 0254)  Free from Infection during hospitalization:   Assess and monitor for signs and symptoms of infection   Monitor lab/diagnostic results   Monitor all insertion sites (i.e. indwelling lines, tubes, urinary catheters, and drains)   Encourage patient and family to use good hand hygiene technique     Problem: Pain  Goal: Pain at adequate level as identified by patient  Outcome: Progressing  Flowsheets (Taken 05/30/2021 0254)  Pain at adequate level as identified by patient:   Assess for risk of opioid induced respiratory depression, including snoring/sleep apnea. Alert healthcare team of risk factors identified.   Identify patient comfort function goal   Assess pain on admission, during daily assessment and/or before any "as needed" intervention(s)   Reassess pain within 30-60 minutes of any  procedure/intervention, per Pain Assessment, Intervention, Reassessment (AIR) Cycle   Evaluate if patient comfort function goal is met   Evaluate patient's satisfaction with pain management progress   Offer non-pharmacological pain management interventions   Include patient/patient care companion in decisions related to pain management as needed     Problem: Side Effects from Pain Analgesia  Goal: Patient will experience minimal side effects of analgesic therapy  Outcome: Progressing  Flowsheets (Taken 05/30/2021 0254)  Patient will experience minimal side effects of analgesic therapy:   Monitor/assess patient's respiratory status (RR depth, effort, breath sounds)   Prevent/manage side effects per LIP orders (i.e. nausea, vomiting, pruritus, constipation, urinary retention, etc.)   Assess for changes in cognitive function   Evaluate for opioid-induced sedation with appropriate assessment tool (i.e. POSS)     Problem: Discharge Barriers  Goal: Patient will be discharged home or other facility with appropriate resources  Outcome: Progressing  Flowsheets (Taken 05/30/2021 0254)  Discharge to home or other facility with appropriate resources: Provide appropriate patient education     Problem: Psychosocial and Spiritual Needs  Goal: Demonstrates ability to cope with hospitalization/illness  Outcome: Progressing  Flowsheets (Taken 05/30/2021 0254)  Demonstrates ability to cope with hospitalizations/illness:   Encourage verbalization of feelings/concerns/expectations   Provide quiet environment   Include patient/ patient care companion in decisions     Problem: Inadequate Airway Clearance  Goal: Patent Airway maintained  Outcome: Progressing  Flowsheets (Taken 05/30/2021 0254)  Patent airway maintained:   Position patient for maximum ventilatory efficiency   Provide adequate fluid intake to liquefy secretions     Problem: Infection Prevention  Goal: Free from infection  Outcome: Progressing  Flowsheets (Taken 05/30/2021  0254)  Free from infection:   Monitor/assess vital signs   Encourage/assist patient to turn, cough and perform deep breathing every 2 hours   Assess incision for evidence of healing   Monitor/assess output from surgical drain if present   Assess for signs and symptoms of infection   Monitor/assess lab values and report abnormal values     Problem: Constipation  Goal: Fluid and electrolyte balance are achieved/maintained  Outcome: Progressing  Flowsheets (Taken 05/30/2021 0254)  Fluid and electrolyte balance are achieved/maintained:   Monitor intake and output every shift   Monitor/assess lab values and report abnormal values   Provide adequate hydration   Assess for confusion/personality changes   Assess and reassess fluid and electrolyte status     Problem: Moderate/High Fall Risk Score >5  Goal: Patient will remain free of falls  Outcome: Progressing  Flowsheets (Taken 05/30/2021 0200)  High (Greater than 13):   HIGH-Apply yellow "Fall Risk" arm band   HIGH-Bed alarm on at all times while patient in bed   MOD-Place Fall Risk level on whiteboard in room   MOD-Use of assistive devices -Bedside Commode if appropriate   MOD-Remain with patient during toileting     Problem: Compromised Tissue integrity  Goal: Damaged tissue is healing and protected  Outcome: Progressing  Flowsheets (Taken 05/30/2021 0254)  Damaged tissue is healing and protected:   Monitor/assess Braden scale every shift   Provide wound care per wound care algorithm   Reposition patient every 2 hours and as needed unless able to reposition self   Increase activity as tolerated/progressive mobility   Relieve pressure to bony prominences for patients at moderate and high risk   Avoid shearing injuries   Use bath wipes, not soap and water, for daily bathing   Keep intact skin clean and dry   Use incontinence wipes for cleaning urine, stool and caustic drainage. Foley care as needed   Monitor external devices/tubes for correct placement to prevent pressure,  friction and shearing   Encourage use of lotion/moisturizer on skin   Monitor patient's hygiene practices  Goal: Nutritional status is improving  Outcome: Progressing  Flowsheets (Taken 05/30/2021 0254)  Nutritional status is improving: Include patient/patient care companion in decisions related to nutrition     Problem: Altered GI Function  Goal: Fluid and electrolyte balance are achieved/maintained  Outcome: Progressing  Flowsheets (Taken 05/30/2021 0254)  Fluid and electrolyte balance are achieved/maintained:   Monitor intake and output every shift   Monitor/assess lab values and report abnormal values   Provide adequate hydration   Assess for confusion/personality changes   Assess and reassess fluid and electrolyte status  Goal: Elimination patterns are normal or improving  Outcome: Progressing  Flowsheets (Taken 05/30/2021 0254)  Elimination patterns are normal or improving:   Report abnormal assessment to physician   Monitor for abdominal discomfort   Anticipate/assist with toileting needs   Monitor for abdominal distension

## 2021-05-31 ENCOUNTER — Ambulatory Visit (INDEPENDENT_AMBULATORY_CARE_PROVIDER_SITE_OTHER): Payer: Medicare Other | Admitting: Cardiology

## 2021-05-31 MED ORDER — PSYLLIUM 3.4 G PO PACK (WRAP)
1.0000 | PACK | Freq: Every day | ORAL | Status: DC
Start: 2021-05-31 — End: 2021-06-01
  Filled 2021-05-31: qty 1

## 2021-05-31 MED ORDER — MIRTAZAPINE 15 MG PO TABS
15.0000 mg | ORAL_TABLET | Freq: Every evening | ORAL | Status: DC
Start: 2021-05-31 — End: 2021-06-01
  Administered 2021-05-31: 22:00:00 15 mg via ORAL
  Filled 2021-05-31: qty 1

## 2021-05-31 NOTE — Discharge Summary (Signed)
MEDICINE DISCHARGE SUMMARY    Date Time: 06/01/21 2:07 PM  Patient Name: Emily [name]  Attending Physician: Charlynn Grimes, MD  Primary Care Physician: Rondel Baton, MD    Date of Admission: 05/22/2021  Date of Discharge: 06/01/2021    Discharge Diagnoses:     Principal Diagnosis (Diagnosis after study, that is chiefly responsible for admission):     Acute ischemic colitis post exploratory laparotomy and right hemicolectomy    Disposition:    Disposition: SNF      Procedures/Radiology performed:   Radiology: all results from this admission  CT Abd/ Pelvis with IV Contrast    Result Date: 05/22/2021  1.  There is increased wall thickening of the cecum and ascending colon with hypoenhancement. A new small focus of gas is seen in the mesenteric draining vein with new tiny foci of gas in the left hepatic lobe that are favored to reflect portal venous gas. Findings are concerning for bowel ischemia. 2.  Faint areas of hypoenhancement in the kidneys bilaterally can be seen with pyelonephritis, recommend correlation with urinalysis. 3.  Subcentimeter pulmonary nodules measuring up to 3 mm. Follow-up should be obtained based on Fleischner guidelines. 4.  The appendix is not visualized. 5.  Ancillary findings as described above. Findings were communicated to Dr. Delphina Cahill at 05/22/2021 9:00 PM. Ewing Schlein MD, MD  05/22/2021 9:05 PM   Surgery: all results from this admission  Procedure(s):  LAPAROSCOPY, DIAGNOSTIC (N/A)  EXPLORATORY LAPAROTOMY (N/A)  LAPAROTOMY, COLECTOMY, RIGHT (Right)     Hospital Course:     Reason for admission/ HPI:      A 75 y.o. female with PMH of breast cancer status post right breast mastectomy, GERD, hypertension, hypothyroidism who presented with acute abdominal pain and was found to have right-sided colitis.    Hospital Course:      # Acute ischemic colitis, Status post exploratory laparotomy and right hemicolectomy 05/23/2021  Leukocytosis improved - last checked 8/26 WBC was 10.   Patient now on  low-fiber diet, but appetite is still not great.  Remeron added for appetite stimulant.  Patient was on Zosyn, switched to Augmentin on discharge.  Probiotics added.  Patient will follow-up with general surgery in 2 weeks after discharge.     Anemia of chronic disease, stable     Deconditioning due to generalized weakness  Activity as tolerated  PT/OT-recommendations for skilled nursing facility     Essential hypertension  Continue amlodipine and lisinopril     Hypothyroidism  Continue Synthroid     Hyperlipidemia  Continue Crestor     Situational depression  --Patient was offered support and counseling  --She does not wish to see psychiatry at this time and states there is no previous history of depression.    Discharge condition: stable    Discharge Day Exam:  Today:  BP 131/75   Pulse 68   Temp 98.8 F (37.1 C) (Oral)   Resp 17   Ht 1.6 m (5\' 3" )   Wt 70 kg (154 lb 5.2 oz)   SpO2 96%   BMI 27.34 kg/m   Ranges for the last 24 hours:  Temp:  [98.4 F (36.9 C)-100.2 F (37.9 C)] 98.8 F (37.1 C)  Heart Rate:  [68-87] 68  Resp Rate:  [16-18] 17  BP: (128-159)/(66-75) 131/75  Body mass index is 27.34 kg/m.      Intake/Output Summary (Last 24 hours) at 06/01/2021 1407  Last data filed at 05/31/2021 1718  Gross per  24 hour   Intake 300 ml   Output --   Net 300 ml    General: awake, alert ,in no acute distress  Cardiovascular: regular rate and rhythm, no murmurs, rubs or gallops  Lungs: clear to auscultation bilaterally, no additional sounds  Abdomen: soft, non-tender, non-distended; normoactive bowel sounds  Extremities: no edema  Neurological: Alert and oriented X 3, moves all extremities.   Other:         Wounds/decutibus ulcers/stage:    Consultations:   Treatment Team:   Attending Provider: Charlynn Grimes, MD  Consulting Physician: Delena Serve, MD  Recent Labs - Last 2:       Recent Labs   Lab 05/28/21  1016 05/26/21  0628   WBC 10.23* 13.73*   Hgb 9.0* 8.9*   Hematocrit 28.9* 29.4*   Platelets  352* 274         Recent Labs   Lab 06/01/21  1139 05/29/21  0859 05/28/21  1016 05/28/21  0623 05/27/21  0550 05/26/21  0628   Sodium 146* 146* 143 143 144 142   Potassium 3.7 3.4* 3.6 3.7 3.3* 3.3*   Chloride 107 109 107 106 107 108   CO2 26 28 27 27 29 29    BUN 5.0* 4.0* 4.0* 4.0* 5.0* 7.0   Creatinine 0.8 0.7 0.8 0.8 0.7 0.8   EGFR >60.0 >60.0 >60.0 >60.0 >60.0 >60.0   Glucose 130* 132* 118* 113* 98 107*   Calcium 9.0 8.2 8.0 8.2 8.4 8.2     Recent Labs   Lab 05/29/21  0859 05/28/21  1016   Alkaline Phosphatase  --  217*   Bilirubin, Total  --  0.2   Protein, Total  --  5.3*   Albumin 2.5* 2.5*   ALT  --  20   AST (SGOT)  --  31            Invalid input(s): FREET4         Microbiology Results (last 15 days)       Procedure Component Value Units Date/Time    COVID-19 (SARS-CoV-2) and Influenza A/B, NAA ([facility])- Admission [[id number]]     Order Status: Canceled Specimen: Culturette from Nasopharyngeal     COVID-19 (SARS-COV-2) [facility]) [[id number]] Collected: 05/22/21 2122    Order Status: Completed Specimen: Nasopharyngeal Swab from Nasopharynx Updated: 05/22/21 2142     Purpose of COVID testing Screening     SARS-CoV-2 Specimen Source Nasopharyngeal     SARS CoV-2 Negative     Comment: Test performed using the Abbott ID NOW EUA assay.  Please see Fact Sheets for patients and providers located at:  http://www.rice.biz/    This test is for the qualitative detection of SARS-CoV-2  (COVID19) nucleic acid. Viral nucleic acids may persist in vivo,  independent of viability. Detection of viral nucleic acid does  not imply the presence of infectious virus, or that virus  nucleic acid is the cause of clinical symptoms. Negative  results should be treated as presumptive and, if inconsistent  with clinical signs and symptoms or necessary for patient  management, should be tested with an alternative molecular  assay. Negative results do not preclude SARS-CoV-2 infection  and should not be used as  the sole basis for patient  management decisions. Invalid results may be due to inhibiting  substances in the specimen and recollection should occur.         Narrative:      o Collect and clearly  label specimen type:  o Upper respiratory specimen: One Nasopharyngeal Dry Swab NO  Transport Media.  o Hand deliver to laboratory ASAP  Indication for testing->Extended care facility admission to  semi private room  Screening    Culture Blood Aerobic and Anaerobic [[id number]] Collected: 05/22/21 2005    Order Status: Completed Specimen: Blood, Venipuncture Updated: 05/28/21 0321    Narrative:      ORDER#: [id number]                                    ORDERED BY: Macky Lower  SOURCE: Blood, Venipuncture                          COLLECTED:  05/22/21 20:05  ANTIBIOTICS AT COLL.:                                RECEIVED :  05/23/21 00:58  Culture Blood Aerobic and Anaerobic        FINAL       05/28/21 03:21  05/28/21   No growth after 5 days of incubation.      Culture Blood Aerobic and Anaerobic [[id number]] Collected: 05/22/21 1935    Order Status: Canceled Specimen: Arm from Blood, Venipuncture     Culture Blood Aerobic and Anaerobic [[id number]] Collected: 05/22/21 1935    Order Status: Completed Specimen: Arm from Blood, Venipuncture Updated: 05/28/21 0321    Narrative:      ORDER#: [id number]                                    ORDERED BY: Macky Lower  SOURCE: Blood, Venipuncture arm                      COLLECTED:  05/22/21 19:35  ANTIBIOTICS AT COLL.:                                RECEIVED :  05/23/21 00:58  Culture Blood Aerobic and Anaerobic        FINAL       05/28/21 03:21  05/28/21   No growth after 5 days of incubation.              Discharge Instructions & Follow Up Plan for Patient:   Discharge Diet: Diet low fiber  Ensure Enlive Supplement Quantity: A. One; Frequency: TID (3 times a day) with meals  Activity/Weight Bearing Status: as tolerated   Patient was instructed to follow up with:      Follow-up  Information       Delena Serve, MD. Schedule an appointment as soon as possible for a visit in 2 week(s).    Specialty: Surgery  Why: Post operative follow up  Contact information:  [address]  [place] [address]  [phone]               Rondel Baton, MD .    Specialty: Internal Medicine  Contact information:  [address]  [place] [address]  [phone]  Complete instructions and follow up are in the patient's After Visit Summary    Minutes spent coordinating discharge and reviewing discharge plan:25 minutes    Discharge Medications:        Medication List        START taking these medications      amoxicillin-clavulanate 875-125 MG per tablet  Commonly known as: AUGMENTIN  Take 1 tablet by mouth 2 (two) times daily for 5 days     mirtazapine 15 MG tablet  Commonly known as: REMERON  Take 1 tablet (15 mg total) by mouth nightly     traMADol 50 MG tablet  Commonly known as: ULTRAM  Take 1 tablet (50 mg total) by mouth every 6 (six) hours as needed for Pain            CONTINUE taking these medications      amLODIPine 10 MG tablet  Commonly known as: NORVASC  Take 1 tablet (10 mg total) by mouth daily     anastrozole 1 MG tablet  Commonly known as: ARIMIDEX  Take 1 tablet (1 mg total) by mouth every morning     aspirin 81 MG chewable tablet  Chew 1 tablet (81 mg total) by mouth daily     desonide 0.05 % ointment  Commonly known as: DESOWEN  Apply topically 2 (two) times daily     diclofenac Sodium 1 % Gel topical gel  Commonly known as: VOLTAREN  Apply 2 g topically 4 (four) times daily     ezetimibe 10 MG tablet  Commonly known as: ZETIA  Take 1 tablet (10 mg total) by mouth daily     gabapentin 100 MG capsule  Commonly known as: NEURONTIN  Take 1 capsule (100 mg total) by mouth 2 (two) times daily as needed (Neuropathy)     guaiFENesin 600 MG 12 hr tablet  Commonly known as: MUCINEX  Take 2 tablets (1,200 mg total) by mouth 2 (two) times  daily     IRON PO     lactobacillus/streptococcus Caps  Take 1 capsule by mouth daily     levothyroxine 25 MCG tablet  Commonly known as: SYNTHROID  Take 1 tablet (25 mcg total) by mouth every morning     lidocaine 5 %  Commonly known as: LIDODERM  Place 1 patch onto the skin every 24 hours Remove & Discard patch within 12 hours or as directed by MD     lisinopril 40 MG tablet  Commonly known as: ZESTRIL  Take 1 tablet (40 mg total) by mouth daily     magnesium oxide 400 MG tablet  Commonly known as: MAG-OX  Take 1 tablet (400 mg total) by mouth daily     meclizine 25 MG tablet  Commonly known as: ANTIVERT  Take 1 tablet (25 mg total) by mouth 3 (three) times daily as needed for Dizziness     multivitamin with minerals tablet     pantoprazole 40 MG tablet  Commonly known as: PROTONIX  Take 1 tablet (40 mg total) by mouth every morning before breakfast     PREVAGEN PO     rosuvastatin 40 MG tablet  Commonly known as: CRESTOR  Take 0.5 tablets (20 mg total) by mouth daily     vitamin B-1 100 MG tablet  Commonly known as: THIAMINE               Where to Get Your Medications        These medications were sent to  [facility] [address] [place] - [address]  [address] [place] [address]      Phone: [phone]   amoxicillin-clavulanate 469-629 MG per tablet  anastrozole 1 MG tablet  lactobacillus/streptococcus Caps  mirtazapine 15 MG tablet       You can get these medications from any pharmacy    Bring a paper prescription for each of these medications  gabapentin 100 MG capsule  traMADol 50 MG tablet         Immunizations provided:   Immunization History   Administered Date(s) Administered    COVID-19 mRNA vaccine 12 years and above (Moderna) 100 mcg/0.5 mL 12/19/2019, 01/16/2020    COVID-19 mRNA vaccine 12 years and above [employer] Purple Cap) 30 mcg/0.3 mL 08/14/2020       This note was generated by the Epic EMR system/ Dragon speech recognition and may contain inherent errors or  omissions not intended by the user. Grammatical errors, random word insertions, deletions and pronoun errors  are occasional consequences of this technology due to software limitations. Not all errors are caught or corrected. If there are questions or concerns about the content of this note or information contained within the body of this dictation they should be addressed directly with the author for clarification.    Signed by: Charlynn Grimes, MD, MD  [facility]  Department of Medicine  CC: Rondel Baton, MD

## 2021-05-31 NOTE — Progress Notes (Signed)
POST OPERATIVE PROGRESS NOTE  [facility] Spectralink: [id number]    Date Time: 05/31/21 9:45 AM  Patient Name: Emily [name], Emily [name]      ASSESSMENT:   9 Days Post-Op S/P Procedure(s):  LAPAROSCOPY, DIAGNOSTIC  EXPLORATORY LAPAROTOMY  LAPAROTOMY, COLECTOMY, RIGHT    + diarrhea, abdominal soreness     PLAN:   -Continue low fiber diet as tolerated  -Fiber supplement  -Encourage ambulation, PT/OT, IS  -Pain control, anti emetics  -Disposition planning per primary team. Plan discussed and agreed upon by Dr. Allena Katz.     SUBJECTIVE:   The patient is doing fairly well.  Post operative pain is well controlled with medications.  Current dietary status:  Ensure Clear Quantity: A. One; Frequency: TID (3 times a day) with meals  Diet low fiber and tolerating fairly well.  Flatus: yes. BM:  yes, diarrhea.  Additional complaints:  abdominal soreness.       OBJECTIVE:   Current Vitals:   Vitals:    05/31/21 0857   BP: 132/61   Pulse: 74   Resp: 18   Temp: 98.2 F (36.8 C)   SpO2: 96%       Intake and Output Summary (Last 24 hours):  I/O last 3 completed shifts:  In: 200 [P.O.:200]  Out: 0     Labs:     Results       ** No results found for the last 24 hours. **            Rads:     Radiology Results (24 Hour)       ** No results found for the last 24 hours. **            Physical Exam:     Mental status - normal mood, behavior, speech, dress, motor activity, and thought processes, alert to self and place  Chest - symmetric air entry, no tachypnea, retractions or cyanosis  Heart - normal rate, regular rhythm  Abdomen - soft, nontender, nondistended, no masses or organomegaly  Wound - clean, dry, no drainage, prevena vac in removed  Extremities -  no pedal edema, no clubbing or cyanosis      Signed by:   Joesph July  Speciality Physician Assistant  [phone]

## 2021-05-31 NOTE — Plan of Care (Signed)
Patient Emily [name]  Problem: Safety  Goal: Patient will be free from injury during hospitalization  Outcome: Progressing  Patient will be free from injury during hospitalization:   Assess patient's risk for falls and implement fall prevention plan of care per policy   Provide and maintain safe environment   Use appropriate transfer methods   Ensure appropriate safety devices are available at the bedside   Include patient/ family/ care giver in decisions related to safety   Hourly rounding  Goal: Patient will be free from infection during hospitalization  Outcome: Progressing  Free from Infection during hospitalization:   Assess and monitor for signs and symptoms of infection   Monitor all insertion sites (i.e. indwelling lines, tubes, urinary catheters, and drains)   Monitor lab/diagnostic results   Encourage patient and family to use good hand hygiene technique     Problem: Pain  Goal: Pain at adequate level as identified by patient  Outcome: Progressing  Pain at adequate level as identified by patient:   Identify patient comfort function goal   Assess for risk of opioid induced respiratory depression, including snoring/sleep apnea. Alert healthcare team of risk factors identified.   Assess pain on admission, during daily assessment and/or before any "as needed" intervention(s)   Reassess pain within 30-60 minutes of any procedure/intervention, per Pain Assessment, Intervention, Reassessment (AIR) Cycle   Evaluate if patient comfort function goal is met   Evaluate patient's satisfaction with pain management progress   Offer non-pharmacological pain management interventions     Problem: Side Effects from Pain Analgesia  Goal: Patient will experience minimal side effects of analgesic therapy  Outcome: Progressing  Patient will experience minimal side effects of analgesic therapy:   Monitor/assess patient's respiratory status (RR depth, effort, breath sounds)   Assess for changes in cognitive function   Prevent/manage side  effects per LIP orders (i.e. nausea, vomiting, pruritus, constipation, urinary retention, etc.)   Evaluate for opioid-induced sedation with appropriate assessment tool (i.e. POSS)     Problem: Inadequate Airway Clearance  Goal: Patent Airway maintained  Outcome: Progressing  Flowsheets (Taken 05/30/2021 2308 by Sherrie Sport, RN)  Patent airway maintained:   Provide adequate fluid intake to liquefy secretions   Reposition patient every 2 hours and as needed unless able to self-reposition   Position patient for maximum ventilatory efficiency   Reinforce use of ordered respiratory interventions (i.e. CPAP, BiPAP, Incentive Spirometer, Acapella, etc.)     Problem: Constipation  Goal: Fluid and electrolyte balance are achieved/maintained  Outcome: Progressing  Fluid and electrolyte balance are achieved/maintained:   Monitor intake and output every shift   Monitor/assess lab values and report abnormal values   Provide adequate hydration   Assess and reassess fluid and electrolyte status     Problem: Compromised Tissue integrity  Goal: Damaged tissue is healing and protected  Outcome: Progressing  Damaged tissue is healing and protected:   Monitor/assess Braden scale every shift   Provide wound care per wound care algorithm   Reposition patient every 2 hours and as needed unless able to reposition self   Increase activity as tolerated/progressive mobility   Relieve pressure to bony prominences for patients at moderate and high risk   Avoid shearing injuries   Use bath wipes, not soap and water, for daily bathing   Keep intact skin clean and dry   Use incontinence wipes for cleaning urine, stool and caustic drainage. Foley care as needed   Monitor external devices/tubes for correct placement to prevent pressure, friction  and shearing   Encourage use of lotion/moisturizer on skin   Monitor patient's hygiene practices     Problem: Altered GI Function  Goal: Fluid and electrolyte balance are achieved/maintained  Outcome:  Progressing  Fluid and electrolyte balance are achieved/maintained:   Monitor intake and output every shift   Monitor/assess lab values and report abnormal values   Provide adequate hydration   Assess and reassess fluid and electrolyte status

## 2021-05-31 NOTE — Discharge Instr - AVS First Page (Addendum)
Reason for your Hospital Admission:  ***      Instructions for after your discharge:  ***

## 2021-06-01 LAB — ECG 12-LEAD
Atrial Rate: 50 {beats}/min
P Axis: 51 degrees
P-R Interval: 186 ms
Q-T Interval: 422 ms
QRS Duration: 90 ms
QTC Calculation (Bezet): 384 ms
R Axis: 52 degrees
T Axis: 45 degrees
Ventricular Rate: 50 {beats}/min

## 2021-06-01 LAB — BASIC METABOLIC PANEL
Anion Gap: 13 (ref 5.0–15.0)
BUN: 5 mg/dL — ABNORMAL LOW (ref 7.0–19.0)
CO2: 26 mEq/L (ref 22–29)
Calcium: 9 mg/dL (ref 7.9–10.2)
Chloride: 107 mEq/L (ref 100–111)
Creatinine: 0.8 mg/dL (ref 0.6–1.0)
Glucose: 130 mg/dL — ABNORMAL HIGH (ref 70–100)
Potassium: 3.7 mEq/L (ref 3.5–5.1)
Sodium: 146 mEq/L — ABNORMAL HIGH (ref 136–145)

## 2021-06-01 LAB — COVID-19 (SARS-COV-2): SARS CoV 2 Overall Result: NOT DETECTED

## 2021-06-01 LAB — GFR: EGFR: >60

## 2021-06-01 MED ORDER — TRAMADOL HCL 50 MG PO TABS
50.0000 mg | ORAL_TABLET | Freq: Four times a day (QID) | ORAL | 0 refills | Status: DC | PRN
Start: 2021-06-01 — End: 2022-05-12

## 2021-06-01 MED ORDER — AMOXICILLIN-POT CLAVULANATE 875-125 MG PO TABS
1.0000 | ORAL_TABLET | Freq: Two times a day (BID) | ORAL | 0 refills | Status: AC
Start: 2021-06-01 — End: 2021-06-06

## 2021-06-01 MED ORDER — ANASTROZOLE 1 MG PO TABS: 1.0000 mg | ORAL_TABLET | Freq: Every morning | ORAL | 0 refills | Status: AC

## 2021-06-01 MED ORDER — RISAQUAD PO CAPS
1.0000 | ORAL_CAPSULE | Freq: Every day | ORAL | 0 refills | Status: DC
Start: 2021-06-01 — End: 2022-01-14

## 2021-06-01 MED ORDER — MIRTAZAPINE 15 MG PO TABS
15.0000 mg | ORAL_TABLET | Freq: Every evening | ORAL | 0 refills | Status: DC
Start: 2021-06-01 — End: 2022-05-12

## 2021-06-01 MED ORDER — GABAPENTIN 100 MG PO CAPS
100.0000 mg | ORAL_CAPSULE | Freq: Two times a day (BID) | ORAL | 0 refills | Status: DC | PRN
Start: 2021-06-01 — End: 2024-02-08

## 2021-06-01 NOTE — Progress Notes (Addendum)
Nutrition Follow-up    Intervention:  Ensure Enlive TID w/ meals  This provides 1050 kcal and 60 g of protein  RN to document po intake and weights     Assessment   Meal Intake (%): 20% average intake of 5 documented meals (05/28/2021-05/31/2021)    Pt's diet order was advanced to low fiber on 8/27. She consistently consumes <50% of meals per Flowsheets, but she consumes Ensure TID and drinks 100% of it. Will continue to send at this time. Labs reviewed. Weights reviewed. Last BM was on 8/28 w/ diarrhea noted. She receives Metamucil per RN note. Pt is currently pending d/c. Will continue to monitor.        Active Hospital Problems    Diagnosis    Ischemic colon    History of colonic polyps    AKI (acute kidney injury)    Acute colitis    Mixed hyperlipidemia    Hypertension       Orders Placed This Encounter   Procedures    Diet low fiber    Ensure Clear Quantity: A. One; Frequency: TID (3 times a day) with meals       Current Meds:  acetaminophen, 650 mg, 4 times per day  amLODIPine, 10 mg, Daily  docusate sodium, 200 mg, Daily  heparin (porcine), 5,000 Units, Q8H SCH  heparin (porcine), 5,000 Units, Pre-Op  levothyroxine, 25 mcg, QAM  lisinopril, 40 mg, Daily  mirtazapine, 15 mg, QHS  piperacillin-tazobactam, 4.5 g, Q8H  psyllium, 1 packet, Daily  rosuvastatin, 20 mg, Daily          Recent Labs:  Recent Labs   Lab 05/28/21  1016 05/26/21  0628   WBC 10.23* 13.73*   Hgb 9.0* 8.9*   Hematocrit 28.9* 29.4*   MCV 86.0 88.8   Platelets 352* 274     Recent Labs   Lab 05/29/21  0859 05/28/21  1016 05/28/21  0623 05/27/21  0550 05/26/21  0628   Sodium 146* 143 143 144 142   Potassium 3.4* 3.6 3.7 3.3* 3.3*   Chloride 109 107 106 107 108   CO2 28 27 27 29 29    BUN 4.0* 4.0* 4.0* 5.0* 7.0   Creatinine 0.7 0.8 0.8 0.7 0.8   Glucose 132* 118* 113* 98 107*   Calcium 8.2 8.0 8.2 8.4 8.2   Magnesium  --   --   --  1.9  --    Phosphorus 2.5  --  2.3 2.1*  --    EGFR >60.0 >60.0 >60.0 >60.0 >60.0     Recent Labs   Lab 05/29/21  0859  05/28/21  1016 05/28/21  0623   Albumin 2.5* 2.5* 2.5*         Intake/Output Summary (Last 24 hours) at 06/01/2021 0813  Last data filed at 05/31/2021 1718  Gross per 24 hour   Intake 740 ml   Output --   Net 740 ml       Anthropometrics  Height: 160 cm (5\' 3" )  Weight: 70 kg (154 lb 5.2 oz)  Weight Change: 0  BMI (calculated): 27.4    Estimated Nutrition Needs:  Estimated Energy Needs  Total Energy Estimated Needs: 1700-1800  Method for Estimating Needs: 25-26 kcal/kg CBW 70 kg    Estimated Protein Needs  Total Protein Estimated Needs: 70-84 g  Method for Estimating Needs: 1.0-1.2 g/kg CBW 70 kg    Fluid Needs  Total Fluid Estimated Needs: 1750 mL or per MD  Method for  Estimating Needs: 25 mL/kg CBW 70 kg or per MD    Learning and Discharge Planning Needs:None     Nutrition Diagnosis:   Inadequate oral intake-continue     Goals:  Diet advancement per MD by next visit-met, discontinue w/ goal  Pt will consume at least 75% of meals by next visit-continue     M/E:  Monitor:diet order, GI, meds and labs  Follow up:5-7 days     Alba Cory, RD  [phone]

## 2021-06-01 NOTE — Progress Notes (Addendum)
Son Dorma Russell notified about transfer to SNF this pm with pick up by [facility] [phone] between 5-530pm.           06/01/21 1614   Discharge Disposition   Patient preference/choice provided? Yes   Physical Discharge Disposition SNF   Receiving facility, unit and room number: [facility],room 219 - A   Nursing report phone number: [phone]   Mode of Transportation Wheelchair [place]   Patient/Family/POA notified of transfer plan Yes   Patient agreeable to discharge plan/expected d/c date? Yes   Family/POA agreeable to discharge plan/expected d/c date? Yes   Bedside nurse notified of transport plan? Yes   CM Interventions   Follow up appointment scheduled? No   Reason no follow up scheduled? Discharge to SNF/AR/LTAC   Notified MD? Yes   Is this a Medicare focused or COVID patient? Yes   Is this appointment within 48-72 hours? No   Referral made for home health RN visit? No, Other (comment)   Multidisciplinary rounds/family meeting before d/c? Yes   Medicare Checklist   Is this a Medicare patient? Yes   Medicaid/DMAS   Medicaid Pre-Screening completed No   DMAS 95 MI/MR completed and faxed No   DMAS 95 MI/MR trigger Level 2 Screening? Yes   [place] pre[place] completed? No   [place] completed? No   [place]/MR completed? No

## 2021-06-01 NOTE — PT Progress Note (Signed)
Physical Therapy Cancellation Note    Patient: Emily [name]  ZOX:[id number]    Unit: J811/B147.82    Patient not seen for physical therapy secondary to pt stating " I had a really bad night and I really want to sleep more" Pt educated on importance of mobility especially after surgery, but pt still politely declined. PT to continue to follow as schedule allows.         Signature:   Lennox Pippins, PT  06/01/2021  9:06 AM  Phone: [phone]    (For scheduling questions, please contact rehab tech [phone])

## 2021-06-01 NOTE — Plan of Care (Addendum)
Called [facility] admissions at [phone] to notify them that pt is ready to d/c today, staff unavailable, left them a message to call back. Left a message for son  Dorma Russell regarding the above.

## 2021-06-01 NOTE — Plan of Care (Signed)
Patient seen and examined.  Awaiting discharge to skilled nursing facility.  Patient continued to have frequent loose stool.  Advised to hydrate very well.  Patient to continue Metamucil.  Appetite stimulant added.  Patient to follow-up with general surgery.

## 2021-06-01 NOTE — Progress Notes (Signed)
POST OPERATIVE PROGRESS NOTE  [facility] Spectralink: [id number]    Date Time: 06/01/21 9:24 AM  Patient Name: Emily [name]      ASSESSMENT:   10 Days Post-Op S/P Procedure(s):  LAPAROSCOPY, DIAGNOSTIC  EXPLORATORY LAPAROTOMY  LAPAROTOMY, COLECTOMY, RIGHT    PLAN:   -Continue low fiber diet as tolerated  -Continue fiber supplement  -Encourage ambulation, PT/OT, IS  -Pain control, anti emetics  -Stable for d/c from surgical standpoint. F/u with Dr. Ned Grace in 2 weeks. Plan discussed and agreed upon by Dr. Joanna Puff. Discussed w/ primary team.     SUBJECTIVE:   The patient is doing fairly well.  Post operative pain is well controlled with medications.  Current dietary status:  Diet low fiber  Ensure Enlive Supplement Quantity: A. One; Frequency: TID (3 times a day) with meals and tolerating fairly well.  Flatus: yes. BM:  yes, diarrhea.  Additional complaints: diarrhea       OBJECTIVE:   Current Vitals:   Vitals:    06/01/21 0739   BP: 128/67   Pulse: 69   Resp: 17   Temp: 98.6 F (37 C)   SpO2: 95%       Intake and Output Summary (Last 24 hours):  I/O last 3 completed shifts:  In: 740 [P.O.:740]  Out: 0     Labs:     Results       ** No results found for the last 24 hours. **            Rads:     Radiology Results (24 Hour)       ** No results found for the last 24 hours. **            Physical Exam:     Mental status - normal mood, behavior, speech, dress, motor activity, and thought processes, alert to self and place  Chest - symmetric air entry, no tachypnea, retractions or cyanosis  Heart - normal rate, regular rhythm  Abdomen - soft, nontender, nondistended, no masses or organomegaly  Wound - clean, dry, no drainage, healing well  Extremities -  no pedal edema, no clubbing or cyanosis  Skin - warm, well perfused       Signed by:   Joesph July  Speciality Physician Assistant  [phone]

## 2021-06-01 NOTE — Plan of Care (Signed)
Pt is A&OX4, vss, temp 100-99, sch tylenol given  Incision in the abdomen c/d/I  Abdomen distended, tender, bowel sounds active  Medications swallowed whole tolerated well,   PO fluids encouraged  Pt is able to ambulate to bathroom with minimal support  Voiding good amount  No bowel movement on this shift  Denied pain, sch tylenol given with good effect    Pt participates to plan of care and goals  Safety protocol followed    Problem: Safety  Goal: Patient will be free from injury during hospitalization  Outcome: Progressing  Flowsheets (Taken 06/01/2021 0342)  Patient will be free from injury during hospitalization:   Assess patient's risk for falls and implement fall prevention plan of care per policy   Use appropriate transfer methods   Ensure appropriate safety devices are available at the bedside   Provide and maintain safe environment   Assess for patients risk for elopement and implement Elopement Risk Plan per policy  Goal: Patient will be free from infection during hospitalization  Outcome: Progressing  Flowsheets (Taken 06/01/2021 0342)  Free from Infection during hospitalization:   Assess and monitor for signs and symptoms of infection   Monitor lab/diagnostic results   Encourage patient and family to use good hand hygiene technique     Problem: Pain  Goal: Pain at adequate level as identified by patient  Outcome: Progressing  Flowsheets (Taken 06/01/2021 0342)  Pain at adequate level as identified by patient:   Identify patient comfort function goal   Reassess pain within 30-60 minutes of any procedure/intervention, per Pain Assessment, Intervention, Reassessment (AIR) Cycle   Assess pain on admission, during daily assessment and/or before any "as needed" intervention(s)   Evaluate if patient comfort function goal is met   Offer non-pharmacological pain management interventions   Evaluate patient's satisfaction with pain management progress   Include patient/patient care companion in decisions related to  pain management as needed     Problem: Side Effects from Pain Analgesia  Goal: Patient will experience minimal side effects of analgesic therapy  Outcome: Progressing  Flowsheets (Taken 06/01/2021 0342)  Patient will experience minimal side effects of analgesic therapy:   Evaluate for opioid-induced sedation with appropriate assessment tool (i.e. POSS)   Assess for changes in cognitive function     Problem: Discharge Barriers  Goal: Patient will be discharged home or other facility with appropriate resources  Outcome: Progressing  Flowsheets (Taken 06/01/2021 0342)  Discharge to home or other facility with appropriate resources: Provide appropriate patient education     Problem: Psychosocial and Spiritual Needs  Goal: Demonstrates ability to cope with hospitalization/illness  Outcome: Progressing  Flowsheets (Taken 06/01/2021 0342)  Demonstrates ability to cope with hospitalizations/illness:   Encourage verbalization of feelings/concerns/expectations   Provide quiet environment     Problem: Inadequate Airway Clearance  Goal: Patent Airway maintained  Outcome: Progressing  Flowsheets (Taken 06/01/2021 0342)  Patent airway maintained:   Provide adequate fluid intake to liquefy secretions   Suction secretions as needed   Reinforce use of ordered respiratory interventions (i.e. CPAP, BiPAP, Incentive Spirometer, Acapella, etc.)   Reposition patient every 2 hours and as needed unless able to self-reposition     Problem: Infection Prevention  Goal: Free from infection  Outcome: Progressing  Flowsheets (Taken 06/01/2021 0342)  Free from infection:   Monitor/assess vital signs   Monitor/assess lab values and report abnormal values     Problem: Constipation  Goal: Fluid and electrolyte balance are achieved/maintained  Outcome: Progressing  Flowsheets (Taken 06/01/2021 [phone])  Fluid and electrolyte balance are achieved/maintained:   Monitor intake and output every shift   Monitor/assess lab values and report abnormal values    Provide adequate hydration   Assess and reassess fluid and electrolyte status     Problem: Moderate/High Fall Risk Score >5  Goal: Patient will remain free of falls  Outcome: Progressing  Flowsheets (Taken 05/31/2021 2000)  High (Greater than 13):   HIGH-Consider use of low bed   HIGH-Apply yellow "Fall Risk" arm band   HIGH-Bed alarm on at all times while patient in bed     Problem: Compromised Tissue integrity  Goal: Damaged tissue is healing and protected  Outcome: Progressing  Flowsheets (Taken 06/01/2021 0342)  Damaged tissue is healing and protected:   Monitor/assess Braden scale every shift   Reposition patient every 2 hours and as needed unless able to reposition self   Increase activity as tolerated/progressive mobility   Keep intact skin clean and dry   Avoid shearing injuries  Goal: Nutritional status is improving  Outcome: Progressing  Flowsheets (Taken 06/01/2021 0342)  Nutritional status is improving: Allow adequate time for meals     Problem: Altered GI Function  Goal: Fluid and electrolyte balance are achieved/maintained  Outcome: Progressing  Flowsheets (Taken 06/01/2021 0342)  Fluid and electrolyte balance are achieved/maintained:   Monitor intake and output every shift   Monitor/assess lab values and report abnormal values   Provide adequate hydration   Assess and reassess fluid and electrolyte status  Goal: Elimination patterns are normal or improving  Outcome: Progressing  Flowsheets (Taken 06/01/2021 0342)  Elimination patterns are normal or improving:   Anticipate/assist with toileting needs   Assess for normal bowel sounds   Monitor for abdominal discomfort   Administer treatments as ordered

## 2021-06-01 NOTE — PT Progress Note (Signed)
[facility]  [address]  [place] [address]  [phone]    Physical Therapy Treatment    Patient:  Emily [name]        MRN#:  [id number]  Unit:  [facility] 4C        Room/Bed:  A213/Y865.78    Medical Diagnosis: Small bowel ischemia [K55.9]  Right lower quadrant abdominal pain [R10.31]    Time of treatment:  PT Received On: 06/01/21  Start Time: 1125 Stop Time: 1137  Time Calculation (min): 12 min       Treatment #: PT Visit Number: 4/5    Patient's medical condition is appropriate for Physical Therapy intervention at this time.    Interpreter utilized: no, not indicated    Assessment   Pt in bed upon arrival, agreeable to PT session. Pt able to transfer supine to sit via log roll method w/ SBA. Pt then able to stand w/ SBA and RW. Pt stated she could ambulate while holding onto the IV pole and did not need the walker. Pt ambulated 150' w/ IV pole and SBA. Pt had to take multiple standing rest breaks due to pain and fatigue. Pt returned to supine via log roll and SBA. Pt left in bed w/ all needs met and call bell in reach.     PMP - Progressive Mobility Protocol   PMP Activity: Step 7 - Walks out of Room  Distance Walked (ft) (Step 6,7): 150 Feet     Plan   Based on today's performance:  Discharge Recommendation: SNF   DME Recommended for Discharge: Front wheel walker     If recommended d/c disposition is not available, patient will need Home w/ 24/7 superv, HHPT.   Transport Recommendations: wheelchair van/transport    Discharge recommendations are based on patient's progression/regression. Please see most recent note for updated discharge recommendations.    Continue plan of care.    Interdisciplinary Communication: RN      Subjective   "Why does this hurt so much"  Patient is agreeable to participation in the therapy session. Nursing clears patient for therapy.  Pain: Pt unable to quantify  Location: stomach  Therapist Intervention: positioned for  comfort  Patient is satisfied with therapist intervention.    Vitals:   Vitals:    05/31/21 2358 06/01/21 0449 06/01/21 0739 06/01/21 0935   BP:  145/72 128/67 128/67   Pulse:  70 69    Resp:  16 17    Temp: 99 F (37.2 C) 98.4 F (36.9 C) 98.6 F (37 C)    TempSrc: Oral Oral Oral    SpO2:  97% 95%    Weight:       Height:           Objective     Precautions/ Contraindications:   Precautions  Weight Bearing Status: no restrictions  Other Precautions: limb alert RUE    Patient is in bed with  Telemetry and Intravenous (IV) in place.      Functional Mobility:  Rolling: SBA  Supine to Sit: SBA  Sit to Supine: SBA  Sit to Stand: SBA w/ RW  Stand to Sit: SBA     Gait:   WB status: no restrictions  Assistive Device: IV pole  Assist Level: SBA  Distance: 150'       Educated the patient to role of physical therapy, plan of care, goals  of therapy, rationale for progressing mobility and safety with  mobility and ADLs and home safety.    RN notified of session outcome and that patient was left in bed with all needs met and equipment intact.   Safety measures include: handoff to nurse/clin tech/ unit secretary completed, bed alarm activated, oriented to call bell and placed within reach, personal items within reach, assistive device positioned out of reach, and bed placed in lowest position.   Mobility and ADL status posted at bedside and within E.M.R.    Goals per Eval/ Re-eval:   Goals  Goal Formulation: With patient  Time for Goal Acheivement: 5 visits  Pt Will Go Supine To Sit: with supervision  Pt Will Perform Sit To Supine: with supervision  Pt Will Perform Sit to Stand: with supervision  Pt Will Ambulate: > 200 feet, with supervision  Pt Will Go Up / Down Stairs: 6-10 stairs, with supervision        Therapist PPE during session procedural mask and gloves     Signature:  Lennox Pippins, PT  06/01/2021 12:33 PM   Phone: [phone]     (For scheduling questions, please contact rehab tech [phone])

## 2021-06-01 NOTE — Plan of Care (Signed)
Problem: Safety  Goal: Patient will be free from injury during hospitalization  06/01/2021 1624 by Candice Camp, RN  Outcome: Completed  06/01/2021 0757 by Candice Camp, RN  Outcome: Progressing  Goal: Patient will be free from infection during hospitalization  06/01/2021 1624 by Candice Camp, RN  Outcome: Completed  06/01/2021 0757 by Candice Camp, RN  Outcome: Progressing     Problem: Pain  Goal: Pain at adequate level as identified by patient  06/01/2021 1624 by Candice Camp, RN  Outcome: Completed  06/01/2021 0757 by Candice Camp, RN  Outcome: Progressing     Problem: Side Effects from Pain Analgesia  Goal: Patient will experience minimal side effects of analgesic therapy  06/01/2021 1624 by Candice Camp, RN  Outcome: Completed  06/01/2021 0757 by Candice Camp, RN  Outcome: Progressing     Problem: Discharge Barriers  Goal: Patient will be discharged home or other facility with appropriate resources  06/01/2021 1624 by Candice Camp, RN  Outcome: Completed  06/01/2021 0757 by Candice Camp, RN  Outcome: Progressing     Problem: Psychosocial and Spiritual Needs  Goal: Demonstrates ability to cope with hospitalization/illness  06/01/2021 1624 by Candice Camp, RN  Outcome: Completed  06/01/2021 0757 by Candice Camp, RN  Outcome: Progressing     Problem: Inadequate Airway Clearance  Goal: Patent Airway maintained  06/01/2021 1624 by Candice Camp, RN  Outcome: Completed  06/01/2021 0757 by Candice Camp, RN  Outcome: Progressing     Problem: Infection Prevention  Goal: Free from infection  06/01/2021 1624 by Candice Camp, RN  Outcome: Completed  06/01/2021 0757 by Candice Camp, RN  Outcome: Progressing     Problem: Constipation  Goal: Fluid and electrolyte balance are achieved/maintained  06/01/2021 1624 by Candice Camp, RN  Outcome: Completed  06/01/2021 0757 by Candice Camp, RN  Outcome: Progressing     Problem: Moderate/High Fall Risk Score >5  Goal: Patient will remain free of falls  06/01/2021 1624 by Candice Camp, RN  Outcome:  Completed  06/01/2021 0757 by Candice Camp, RN  Outcome: Progressing     Problem: Compromised Tissue integrity  Goal: Damaged tissue is healing and protected  06/01/2021 1624 by Candice Camp, RN  Outcome: Completed  06/01/2021 0757 by Candice Camp, RN  Outcome: Progressing  Goal: Nutritional status is improving  06/01/2021 1624 by Candice Camp, RN  Outcome: Completed  06/01/2021 0757 by Candice Camp, RN  Outcome: Progressing     Problem: Altered GI Function  Goal: Fluid and electrolyte balance are achieved/maintained  06/01/2021 1624 by Candice Camp, RN  Outcome: Completed  06/01/2021 0757 by Candice Camp, RN  Outcome: Progressing  Goal: Elimination patterns are normal or improving  06/01/2021 1624 by Candice Camp, RN  Outcome: Completed  06/01/2021 0757 by Candice Camp, RN  Outcome: Progressing

## 2021-06-01 NOTE — Progress Notes (Signed)
Patient to discharge to [facility] via [facility] transport at [place]; report called to Maralyn Sago RN at facility; all questions answered and there is a good understanding of need for continued care; Case Management faxed over all pertinent paperwork and home prescriptions called in to patient's local pharmacy; patient disposition stable; PIV DeBary'd await arrival of [facility]; continue POC until that time.

## 2021-06-01 NOTE — UM Notes (Signed)
DOS : 05/31/21    Patient remains medically active as per Surgery's note:    "POST OPERATIVE PROGRESS NOTE  [facility] Spectralink: [id number]     Date Time: 05/31/21 9:45 AM          ASSESSMENT:   9 Days Post-Op S/P Procedure(s):  LAPAROSCOPY, DIAGNOSTIC  EXPLORATORY LAPAROTOMY  LAPAROTOMY, COLECTOMY, RIGHT     + diarrhea, abdominal soreness      PLAN:   -Continue low fiber diet as tolerated  -Fiber supplement  -Encourage ambulation, PT/OT, IS  -Pain control, anti emetics  -Disposition planning per primary team. Plan discussed and agreed upon by Dr. Allena Katz.      SUBJECTIVE:   The patient is doing fairly well.  Post operative pain is well controlled with medications.  Current dietary status:  Ensure Clear Quantity: A. One; Frequency: TID (3 times a day) with meals  Diet low fiber and tolerating fairly well.  Flatus: yes. BM:  yes, diarrhea.  Additional complaints:  abdominal soreness.        OBJECTIVE:   Current Vitals:   Vitals[] Expand by Default       Vitals:     05/31/21 0857   BP: 132/61   Pulse: 74   Resp: 18   Temp: 98.2 F (36.8 C)   SpO2: 96%      ..."        Isac Caddy.  UR Case Manager, [phone]  [employer]  [address]   [address]   [place] [address]    Main UR number: [phone]  Fax: [phone]

## 2021-06-01 NOTE — Progress Notes (Signed)
Patient discharged with [facility] transport without incident.

## 2021-06-01 NOTE — Plan of Care (Signed)
Problem: Safety  Goal: Patient will be free from injury during hospitalization  Outcome: Progressing  Goal: Patient will be free from infection during hospitalization  Outcome: Progressing     Problem: Pain  Goal: Pain at adequate level as identified by patient  Outcome: Progressing     Problem: Side Effects from Pain Analgesia  Goal: Patient will experience minimal side effects of analgesic therapy  Outcome: Progressing     Problem: Discharge Barriers  Goal: Patient will be discharged home or other facility with appropriate resources  Outcome: Progressing     Problem: Psychosocial and Spiritual Needs  Goal: Demonstrates ability to cope with hospitalization/illness  Outcome: Progressing     Problem: Inadequate Airway Clearance  Goal: Patent Airway maintained  Outcome: Progressing     Problem: Infection Prevention  Goal: Free from infection  Outcome: Progressing     Problem: Constipation  Goal: Fluid and electrolyte balance are achieved/maintained  Outcome: Progressing     Problem: Moderate/High Fall Risk Score >5  Goal: Patient will remain free of falls  Outcome: Progressing     Problem: Compromised Tissue integrity  Goal: Damaged tissue is healing and protected  Outcome: Progressing  Goal: Nutritional status is improving  Outcome: Progressing     Problem: Altered GI Function  Goal: Fluid and electrolyte balance are achieved/maintained  Outcome: Progressing  Goal: Elimination patterns are normal or improving  Outcome: Progressing

## 2021-06-01 NOTE — Plan of Care (Signed)
Spoke to [place] at  [facility] admissions that pt is ready for d/c pending Covid -19 screen result. Also room # 219 A received  and staff to call report on [phone]. Order  placed  in epic and  RN notified.

## 2021-09-08 ENCOUNTER — Telehealth (INDEPENDENT_AMBULATORY_CARE_PROVIDER_SITE_OTHER): Payer: Self-pay

## 2021-09-08 ENCOUNTER — Telehealth (INDEPENDENT_AMBULATORY_CARE_PROVIDER_SITE_OTHER): Payer: Self-pay | Admitting: Cardiology

## 2021-09-08 NOTE — Telephone Encounter (Signed)
Patient called stating she had chest pain last night as well as tightness under her left breast this morning. No active symptoms when calling [facility]. I left a message for the patient to return my call.

## 2021-09-08 NOTE — Telephone Encounter (Signed)
Patient called stating she had chest pain last night as well as tightness under her left breast this morning. No active symptoms when calling. PT asked for call back at [phone]    [facility]   [facility]

## 2021-09-29 NOTE — Progress Notes (Deleted)
[name] VISIT    ASSESSMENT & PLAN:   No diagnosis found.       *SOB  -worsening over last month, with exertion  -normal nuclear stress in July 2020, if negative, consider seasonal allergies/lungs    *bradycardia  -14-day MCOT monitor to rule out pauses; no pauses but 3-7 beat run of SVT  -possibly contributing to SOB    *Chest pain  -unchanged from previous visits, atypical, improves with movement, palpable    *HTN  -controlled   -continue CCB, ACEi    No follow-ups on file.      HISTORY OF PRESENT ILLNESS:  I had the pleasure of seeing [name] today in a cardiac follow up office visit for shortness of breath that has worsened over the last month and low heart rate. She called office on 09/08/21 and left message  reporting chest tightness but failed to return out call when we called back.    [name] is a 75 y.o. female with a past medical history significant for HTN, hypercholesterolemia, hypothyroidism, breast CA with mastectomy. Hospital Dec 2021 with arm numbness but no acute event by MRI or clinical evaluation, Cervical and thoracic spondylosis. Hospitalized Aug 2022 with acute ischemic colitis with right hemicolectromy on 05/23/21.     At today's visit [name] is doing well.  She denies edema, presyncope, or syncope.   She continues to report left-sided chest pain that is palpable and improves with movement this is unchanged from previous visits.  She reports shortness of breath that is worsened over the last month especially noticeable when walking upstairs to get to her home she now can only walk up 6 stairs before she needs to rest and she was previously able to walk up more than 12.  She also reports a low heart rate and has sinus bradycardia at 50 bpm today.  Her blood pressure at home runs in the 110s over 60s and her heart rate is in the 50s.  She has some dizziness when she moves from lying down to getting up but it is manageable. She has 1-2 palpitations per  week.***    Cardiographics:  TTE(Apr 2021):  LVEF 60-65%, LV Grade I DD      Nuclear stress Jul 2020: Normal without evidence of inducible ischemia or scar.     MOBILE CARDIAC TELEMETRY  Indication:  Bradycardia     Data      Hook up date:  04/02/2021                                                                   Disconnect date:  04/03/2021  Total days of monitoring:  24 hours  Resulted date:  May 02, 2021  Quality:  good   Tech comments:       Findings      Baseline rhythm:  Sinus rhythm        Impression      Normal sinus rhythm with slowest heart rate 44 bpm at 9 AM.  Rare PAC and PVC  3 SVT from 3-7 beats fastest rate 125 bpm         ECG:sinus bradycardia at 50 bpm with nonspecific T wave abnormality, no significant change      PMH:   Patient Active Problem List    Diagnosis Date Noted    Ischemic colon 05/22/2021    History of colonic polyps 05/22/2021    AKI (acute kidney injury) 05/22/2021    Hypertensive emergency     Altered mental status 03/08/2021    History of hypothyroidism 09/27/2020    History of breast cancer 09/27/2020    Right lower quadrant abdominal pain 07/10/2020    Abnormal CT scan, gastrointestinal tract 07/10/2020    Dizziness 05/28/2020    Acute colitis 03/25/2020    COVID-19 ruled out by laboratory testing 03/10/2020    Left-sided weakness 01/27/2020    Left sided numbness 04/15/2019    Chest discomfort 03/15/2019    SOB (shortness of breath) 03/15/2019    Right sided numbness 11/13/2018    Hypothyroid 04/03/2018    Mixed hyperlipidemia 03/15/2018    Hypertension 01/19/2018    Malignant neoplasm of overlapping sites of right female breast 10/19/2015        MEDICATIONS:     Current Outpatient Medications   Medication Sig Dispense Refill    amLODIPine (NORVASC) 10 MG tablet Take 1 tablet (10 mg total) by mouth daily 90 tablet 1    anastrozole (ARIMIDEX) 1 MG tablet Take 1 tablet (1 mg total) by mouth every morning 30 tablet 0    Apoaequorin (PREVAGEN PO) Take by mouth daily      aspirin  81 MG chewable tablet Chew 1 tablet (81 mg total) by mouth daily 90 tablet 0    desonide (DESOWEN) 0.05 % ointment Apply topically 2 (two) times daily 15 g 0    diclofenac Sodium (VOLTAREN) 1 % Gel topical gel Apply 2 g topically 4 (four) times daily 100 g 0    ezetimibe (ZETIA) 10 MG tablet Take 1 tablet (10 mg total) by mouth daily 90 tablet 3    Ferrous Sulfate (IRON PO) Take by mouth      gabapentin (NEURONTIN) 100 MG capsule Take 1 capsule (100 mg total) by mouth 2 (two) times daily as needed (Neuropathy) 60 capsule 0    guaiFENesin (MUCINEX) 600 MG 12 hr tablet Take 2 tablets (1,200 mg total) by mouth 2 (two) times daily 14 tablet 0    lactobacillus/streptococcus (RISAQUAD) Cap Take 1 capsule by mouth daily 60 capsule 0    levothyroxine (SYNTHROID) 25 MCG tablet Take 1 tablet (25 mcg total) by mouth every morning 30 tablet 0    lidocaine (LIDODERM) 5 % Place 1 patch onto the skin every 24 hours Remove & Discard patch within 12 hours or as directed by MD 30 patch 0    lisinopril (ZESTRIL) 40 MG tablet Take 1 tablet (40 mg total) by mouth daily 90 tablet 1    magnesium oxide (MAG-OX) 400 MG tablet Take 1 tablet (400 mg total) by mouth daily 30 tablet 0    meclizine (ANTIVERT) 25 MG tablet Take 1 tablet (25 mg total) by mouth 3 (three) times daily as needed for Dizziness 10 tablet 0    mirtazapine (REMERON) 15 MG tablet Take 1 tablet (15 mg total) by mouth nightly 30 tablet 0    Multiple Vitamins-Minerals (MULTIVITAMIN WITH MINERALS) tablet Take 1 tablet by mouth every morning          pantoprazole (PROTONIX) 40 MG tablet Take 1 tablet (40 mg total) by mouth every morning before breakfast 30 tablet 0    rosuvastatin (CRESTOR) 40 MG tablet Take 0.5 tablets (20 mg total) by mouth daily 90   tablet 3    traMADol (ULTRAM) 50 MG tablet Take 1 tablet (50 mg total) by mouth every 6 (six) hours as needed for Pain 30 tablet 0    vitamin B-1 (THIAMINE) 100 MG tablet Take 100 mg by mouth every morning.       No current  facility-administered medications for this visit.        SH:   Social History     Tobacco Use    Smoking status: Never    Smokeless tobacco: Never   Vaping Use    Vaping Use: Never used   Substance Use Topics    Alcohol use: Not Currently     Comment: occ    Drug use: No       REVIEW OF SYSTEMS: All other systems reviewed and negative except as above.    PHYSICAL EXAMINATION   General Appearance: A well-appearing female in no acute distress.   Vital Signs: There were no vitals taken for this visit.   HEENT: Sclera anicteric, conjunctiva without pallor, moist mucous membranes, normal dentition.   Neck: Supple without jugular venous distention.Normal carotid upstrokes without bruits.  Chest: Clear to auscultation bilaterally with good air movement and respiratory effort and no wheezes, rales, or rhonchi  Cardiovascular: Normal S1 and physiologically split S2 without murmurs, gallops or rub. PMI of normal size and nondisplaced.   Abdomen: Soft, nontender. No organomegaly.  No pulsatile masses or bruits.    Extremities: Warm without edema. All peripheral pulses are full and equal.  Skin: No rash, xanthoma or xanthelasma.   Neuro: Alert and oriented x3. Grossly intact. Strength is symmetrical. Normal mood and affect.       Basic Metabolic Profile   Lab Results   Component Value Date    NA 146 (H) 06/01/2021    K 3.7 06/01/2021    BUN 5.0 (L) 06/01/2021    CREAT 0.8 06/01/2021    MG 1.9 05/27/2021    CA 9.0 06/01/2021    GLU 130 (H) 06/01/2021         Cardiac Biomarkers   Lab Results   Component Value Date    BNP 35 01/03/2021        CBC with Diff   Lab Results   Component Value Date    WBC 10.23 (H) 05/28/2021    HGB 9.0 (L) 05/28/2021    HCT 28.9 (L) 05/28/2021    PLT 352 (H) 05/28/2021         Cholesterol Panel   Lab Results   Component Value Date    CHOL 196 03/09/2021    HDL 61 03/09/2021    LDL 125 (H) 03/09/2021    TRIG 50 03/09/2021         Endocrine   Lab Results   Component Value Date    HGBA1C 5.5 03/09/2021     HGBA1C 5.4 09/28/2020    HGBA1C 5.2 05/29/2020    TSH 0.56 03/08/2021         Coagulation Studies   Lab Results   Component Value Date    INR 1.1 05/22/2021    DDIMER 0.93 (H) 05/10/2020         ----------------------------  Atzel Mccambridge, AGNP-C   [facility]  [place]   Tel.: [phone], Fax: [phone]  [address], [place], [place] [address]  [address] [place] [address], [place], [place] [address]  _____________________________      Incident to service performed with physician present in the office. The physician's   plan of care was implemented.

## 2021-09-30 ENCOUNTER — Ambulatory Visit (INDEPENDENT_AMBULATORY_CARE_PROVIDER_SITE_OTHER): Payer: Medicare Other | Admitting: Registered Nurse

## 2021-10-11 ENCOUNTER — Ambulatory Visit (INDEPENDENT_AMBULATORY_CARE_PROVIDER_SITE_OTHER): Payer: Medicare Other | Admitting: Registered Nurse

## 2021-10-11 NOTE — Progress Notes (Deleted)
Emily [name]    ASSESSMENT & PLAN:   No diagnosis found.       *SOB  -worsening over last month, with exertion  -normal nuclear stress in July 2020, if negative, consider seasonal allergies/lungs    *bradycardia  -14-day MCOT monitor to rule out pauses; no pauses but 3-7 beat run of SVT  -possibly contributing to SOB    *Chest pain  -unchanged from previous visits, atypical, improves with movement, palpable    *HTN  -controlled   -continue CCB, ACEi    No follow-ups on file.      HISTORY OF PRESENT ILLNESS:  I had the pleasure of seeing Emily [name] in a cardiac follow up office [name] for shortness of breath that has worsened over the last month and low heart rate. She called office on 09/08/21 and left message  reporting chest tightness but failed to return out call when we called back.    Emily [name] is a 76 y.o. female with a past medical history significant for HTN, hypercholesterolemia, hypothyroidism, breast CA with mastectomy. Hospital Dec 2021 with arm numbness but no acute event by MRI or clinical evaluation, Cervical and thoracic spondylosis. Hospitalized Aug 2022 with acute ischemic colitis with right hemicolectromy on 05/23/21.     At [name]'s [name] Emily [name] is doing well.  She denies edema, presyncope, or syncope.   She continues to report left-sided chest pain that is palpable and improves with movement this is unchanged from previous visits.  She reports shortness of breath that is worsened over the last month especially noticeable when walking upstairs to get to her home she now can only walk up 6 stairs before she needs to rest and she was previously able to walk up more than 12.  She also reports a low heart rate and has sinus bradycardia at 50 bpm [name].  Her blood pressure at home runs in the 110s over 60s and her heart rate is in the 50s.  She has some dizziness when she moves from lying down to getting up but it is manageable. She has 1-2 palpitations per  week.***    Cardiographics:  TTE(Apr 2021):  LVEF 60-65%, LV Grade I DD      Nuclear stress Jul 2020: Normal without evidence of inducible ischemia or scar.     MOBILE CARDIAC TELEMETRY  Indication:  Bradycardia     Data      Hook up date:  04/02/2021                                                                   Disconnect date:  04/03/2021  Total days of monitoring:  24 hours  Resulted date:  May 02, 2021  Quality:  good   Tech comments:       Findings      Baseline rhythm:  Sinus rhythm        Impression      Normal sinus rhythm with slowest heart rate 44 bpm at 9 AM.  Rare PAC and PVC  3 SVT from 3-7 beats fastest rate 125 bpm         ZOX:WRUEA bradycardia at 50 bpm with nonspecific T wave abnormality, no significant change  PMH:   Patient Active Problem List    Diagnosis Date Noted    Ischemic colon 05/22/2021    History of colonic polyps 05/22/2021    AKI (acute kidney injury) 05/22/2021    Hypertensive emergency     Altered mental status 03/08/2021    History of hypothyroidism 09/27/2020    History of breast cancer 09/27/2020    Right lower quadrant abdominal pain 07/10/2020    Abnormal CT scan, gastrointestinal tract 07/10/2020    Dizziness 05/28/2020    Acute colitis 03/25/2020    COVID-19 ruled out by laboratory testing 03/10/2020    Left-sided weakness 01/27/2020    Left sided numbness 04/15/2019    Chest discomfort 03/15/2019    SOB (shortness of breath) 03/15/2019    Right sided numbness 11/13/2018    Hypothyroid 04/03/2018    Mixed hyperlipidemia 03/15/2018    Hypertension 01/19/2018    Malignant neoplasm of overlapping sites of right female breast 10/19/2015        MEDICATIONS:     Current Outpatient Medications   Medication Sig Dispense Refill    amLODIPine (NORVASC) 10 MG tablet Take 1 tablet (10 mg total) by mouth daily 90 tablet 1    anastrozole (ARIMIDEX) 1 MG tablet Take 1 tablet (1 mg total) by mouth every morning 30 tablet 0    Apoaequorin (PREVAGEN PO) Take by mouth daily      aspirin  81 MG chewable tablet Chew 1 tablet (81 mg total) by mouth daily 90 tablet 0    desonide (DESOWEN) 0.05 % ointment Apply topically 2 (two) times daily 15 g 0    diclofenac Sodium (VOLTAREN) 1 % Gel topical gel Apply 2 g topically 4 (four) times daily 100 g 0    ezetimibe (ZETIA) 10 MG tablet Take 1 tablet (10 mg total) by mouth daily 90 tablet 3    Ferrous Sulfate (IRON PO) Take by mouth      gabapentin (NEURONTIN) 100 MG capsule Take 1 capsule (100 mg total) by mouth 2 (two) times daily as needed (Neuropathy) 60 capsule 0    guaiFENesin (MUCINEX) 600 MG 12 hr tablet Take 2 tablets (1,200 mg total) by mouth 2 (two) times daily 14 tablet 0    lactobacillus/streptococcus (RISAQUAD) Cap Take 1 capsule by mouth daily 60 capsule 0    levothyroxine (SYNTHROID) 25 MCG tablet Take 1 tablet (25 mcg total) by mouth every morning 30 tablet 0    lidocaine (LIDODERM) 5 % Place 1 patch onto the skin every 24 hours Remove & Discard patch within 12 hours or as directed by MD 30 patch 0    lisinopril (ZESTRIL) 40 MG tablet Take 1 tablet (40 mg total) by mouth daily 90 tablet 1    magnesium oxide (MAG-OX) 400 MG tablet Take 1 tablet (400 mg total) by mouth daily 30 tablet 0    meclizine (ANTIVERT) 25 MG tablet Take 1 tablet (25 mg total) by mouth 3 (three) times daily as needed for Dizziness 10 tablet 0    mirtazapine (REMERON) 15 MG tablet Take 1 tablet (15 mg total) by mouth nightly 30 tablet 0    Multiple Vitamins-Minerals (MULTIVITAMIN WITH MINERALS) tablet Take 1 tablet by mouth every morning          pantoprazole (PROTONIX) 40 MG tablet Take 1 tablet (40 mg total) by mouth every morning before breakfast 30 tablet 0    rosuvastatin (CRESTOR) 40 MG tablet Take 0.5 tablets (20 mg total) by mouth daily 90  tablet 3    traMADol (ULTRAM) 50 MG tablet Take 1 tablet (50 mg total) by mouth every 6 (six) hours as needed for Pain 30 tablet 0    vitamin B-1 (THIAMINE) 100 MG tablet Take 100 mg by mouth every morning.       No current  facility-administered medications for this [name].        SH:   Social History     Tobacco Use    Smoking status: Never    Smokeless tobacco: Never   Vaping Use    Vaping Use: Never used   Substance Use Topics    Alcohol use: Not Currently     Comment: occ    Drug use: No       REVIEW OF SYSTEMS: All other systems reviewed and negative except as above.    PHYSICAL EXAMINATION   General Appearance: A well-appearing female in no acute distress.   Vital Signs: There were no vitals taken for this [name].   HEENT: Sclera anicteric, conjunctiva without pallor, moist mucous membranes, normal dentition.   Neck: Supple without jugular venous distention.Normal carotid upstrokes without bruits.  Chest: Clear to auscultation bilaterally with good air movement and respiratory effort and no wheezes, rales, or rhonchi  Cardiovascular: Normal S1 and physiologically split S2 without murmurs, gallops or rub. PMI of normal size and nondisplaced.   Abdomen: Soft, nontender. No organomegaly.  No pulsatile masses or bruits.    Extremities: Warm without edema. All peripheral pulses are full and equal.  Skin: No rash, xanthoma or xanthelasma.   Neuro: Alert and oriented x3. Grossly intact. Strength is symmetrical. Normal mood and affect.       Basic Metabolic Profile   Lab Results   Component Value Date    NA 146 (H) 06/01/2021    K 3.7 06/01/2021    BUN 5.0 (L) 06/01/2021    CREAT 0.8 06/01/2021    MG 1.9 05/27/2021    CA 9.0 06/01/2021    GLU 130 (H) 06/01/2021         Cardiac Biomarkers   Lab Results   Component Value Date    BNP 35 01/03/2021        CBC with Diff   Lab Results   Component Value Date    WBC 10.23 (H) 05/28/2021    HGB 9.0 (L) 05/28/2021    HCT 28.9 (L) 05/28/2021    PLT 352 (H) 05/28/2021         Cholesterol Panel   Lab Results   Component Value Date    CHOL 196 03/09/2021    HDL 61 03/09/2021    LDL 125 (H) 03/09/2021    TRIG 50 03/09/2021         Endocrine   Lab Results   Component Value Date    HGBA1C 5.5 03/09/2021     HGBA1C 5.4 09/28/2020    HGBA1C 5.2 05/29/2020    TSH 0.56 03/08/2021         Coagulation Studies   Lab Results   Component Value Date    INR 1.1 05/22/2021    DDIMER 0.93 (H) 05/10/2020         ----------------------------  Phillis Haggis, AGNP-C  [facility]   Tel.: [phone], Fax: [phone]  [address], [place], [place] [address]  [address], [place], [place] [address]  _____________________________      Incident to service performed with physician present in the office. The physician's  plan of care was implemented.

## 2021-10-15 ENCOUNTER — Emergency Department
Admission: EM | Admit: 2021-10-15 | Discharge: 2021-10-15 | Disposition: A | Discharge status: Home / Self Care | Payer: Medicare Other | Attending: Emergency Medicine | Admitting: Emergency Medicine

## 2021-10-15 ENCOUNTER — Emergency Department: Payer: Medicare Other

## 2021-10-15 DIAGNOSIS — R0789 Other chest pain: Secondary | ICD-10-CM | POA: Insufficient documentation

## 2021-10-15 LAB — ECG 12-LEAD
Atrial Rate: 72 {beats}/min
IHS MUSE NARRATIVE AND IMPRESSION: NORMAL
P Axis: 58 degrees
P-R Interval: 166 ms
Q-T Interval: 384 ms
QRS Duration: 92 ms
QTC Calculation (Bezet): 420 ms
R Axis: 35 degrees
T Axis: 46 degrees
Ventricular Rate: 72 {beats}/min

## 2021-10-15 LAB — CBC AND DIFFERENTIAL
Absolute NRBC: 0 10*3/uL (ref 0.00–0.00)
Basophils Absolute Automated: 0.04 10*3/uL (ref 0.00–0.08)
Basophils Automated: 0.5 %
Eosinophils Absolute Automated: 0.11 10*3/uL (ref 0.00–0.44)
Eosinophils Automated: 1.4 %
Hematocrit: 33.2 % — ABNORMAL LOW (ref 34.7–43.7)
Hgb: 10 g/dL — ABNORMAL LOW (ref 11.4–14.8)
Immature Granulocytes Absolute: 0.01 10*3/uL (ref 0.00–0.07)
Immature Granulocytes: 0.1 %
Instrument Absolute Neutrophil Count: 3.27 10*3/uL (ref 1.10–6.33)
Lymphocytes Absolute Automated: 3.73 10*3/uL — ABNORMAL HIGH (ref 0.42–3.22)
Lymphocytes Automated: 48.1 %
MCH: 25.3 pg (ref 25.1–33.5)
MCHC: 30.1 g/dL — ABNORMAL LOW (ref 31.5–35.8)
MCV: 84.1 fL (ref 78.0–96.0)
MPV: 10.8 fL (ref 8.9–12.5)
Monocytes Absolute Automated: 0.59 10*3/uL (ref 0.21–0.85)
Monocytes: 7.6 %
Neutrophils Absolute: 3.27 10*3/uL (ref 1.10–6.33)
Neutrophils: 42.3 %
Nucleated RBC: 0 /100 WBC (ref 0.0–0.0)
Platelets: 354 10*3/uL — ABNORMAL HIGH (ref 142–346)
RBC: 3.95 10*6/uL (ref 3.90–5.10)
RDW: 14 % (ref 11–15)
WBC: 7.75 10*3/uL (ref 3.10–9.50)

## 2021-10-15 LAB — COMPREHENSIVE METABOLIC PANEL
ALT: 13 U/L (ref 0–55)
AST (SGOT): 20 U/L (ref 5–41)
Albumin/Globulin Ratio: 1.4 (ref 0.9–2.2)
Albumin: 4 g/dL (ref 3.5–5.0)
Alkaline Phosphatase: 80 U/L (ref 37–117)
Anion Gap: 7 (ref 5.0–15.0)
BUN: 15 mg/dL (ref 7.0–21.0)
Bilirubin, Total: 0.2 mg/dL (ref 0.2–1.2)
CO2: 29 mEq/L (ref 17–29)
Calcium: 9.4 mg/dL (ref 7.9–10.2)
Chloride: 107 mEq/L (ref 99–111)
Creatinine: 1 mg/dL (ref 0.4–1.0)
Globulin: 2.8 g/dL (ref 2.0–3.6)
Glucose: 100 mg/dL (ref 70–100)
Potassium: 4.6 mEq/L (ref 3.5–5.3)
Protein, Total: 6.8 g/dL (ref 6.0–8.3)
Sodium: 143 mEq/L (ref 135–145)

## 2021-10-15 LAB — GFR: EGFR: >60

## 2021-10-15 LAB — HIGH SENSITIVITY TROPONIN-I
hs Troponin-I: 2.7 ng/L
hs Troponin-I: <2.7 ng/L

## 2021-10-15 LAB — IHS D-DIMER: D-Dimer: 3.21 ug/mL FEU — ABNORMAL HIGH (ref 0.00–0.60)

## 2021-10-15 MED ORDER — IBUPROFEN 600 MG PO TABS
600.0000 mg | ORAL_TABLET | Freq: Four times a day (QID) | ORAL | 0 refills | Status: DC | PRN
Start: 2021-10-15 — End: 2022-01-16

## 2021-10-15 MED ORDER — ASPIRIN 81 MG PO CHEW
324.0000 mg | CHEWABLE_TABLET | Freq: Once | ORAL | Status: AC
Start: 2021-10-15 — End: 2021-10-15
  Administered 2021-10-15: 324 mg via ORAL
  Filled 2021-10-15: qty 4

## 2021-10-15 MED ORDER — IOHEXOL 350 MG/ML IV SOLN
80.0000 mL | Freq: Once | INTRAVENOUS | Status: AC | PRN
Start: 2021-10-15 — End: 2021-10-15
  Administered 2021-10-15: 80 mL via INTRAVENOUS

## 2021-10-15 MED ORDER — KETOROLAC TROMETHAMINE 30 MG/ML IJ SOLN
30.0000 mg | Freq: Once | INTRAMUSCULAR | Status: AC
Start: 2021-10-15 — End: 2021-10-15
  Administered 2021-10-15: 30 mg via INTRAVENOUS
  Filled 2021-10-15: qty 1

## 2021-10-15 NOTE — ED Provider Notes (Signed)
EMERGENCY DEPARTMENT NOTE     Patient initially seen and examined at   ED PHYSICIAN ASSIGNED       Date/Time Event User Comments    10/15/21 0938 Physician Assigned Leslee Home, MD assigned as Attending           ED MIDLEVEL (APP) ASSIGNED       None            HISTORY OF PRESENT ILLNESS   Independent Historian:No  Translator Used: no    Chief Complaint: Palpitations       76 y.o. female with no reported hx of chest pain (but multiple charts indicating visits for chest pain) presents with left sided chest pain, right sided head pain, left leg pain. She states the pain in all 3 areas started last night. She cannot tell me the time. It is sharp pain. It is still present now. No associated symptoms. Pain is not exertional nor pleuritic.        MEDICAL HISTORY     Past Medical History:  Past Medical History:   Diagnosis Date    Breast cancer 2014    right breast mastectomy    Exercise-induced angina     negative work up    GERD (gastroesophageal reflux disease)     Headache     resolved    Hyperlipidemia     Hypertension     Hypothyroidism     Low back pain     Malignant neoplasm of overlapping sites of right female breast 10/19/2015    Syncope and collapse     Chronic Vertigo    Vertigo        Past Surgical History:  Past Surgical History:   Procedure Laterality Date    COLONOSCOPY  2021    COLONOSCOPY, POLYPECTOMY N/A 07/21/2020    Procedure: COLONOSCOPY, POLYPECTOMY;  Surgeon: Einar Grad, MD;  Location: [facility];  Service: Gastroenterology;  Laterality: N/A;    EGD, BIOPSY N/A 07/21/2020    Procedure: EGD, BIOPSY;  Surgeon: Einar Grad, MD;  Location: [facility];  Service: Gastroenterology;  Laterality: N/A;    EXPLORATORY LAPAROTOMY N/A 05/22/2021    Procedure: EXPLORATORY LAPAROTOMY;  Surgeon: Delena Serve, MD;  Location: [facility];  Service: General;  Laterality: N/A;    HYSTERECTOMY      LAPAROSCOPY, DIAGNOSTIC N/A 05/22/2021    Procedure: LAPAROSCOPY, DIAGNOSTIC;   Surgeon: Delena Serve, MD;  Location: [facility];  Service: General;  Laterality: N/A;    LAPAROTOMY, COLECTOMY, RIGHT Right 05/22/2021    Procedure: LAPAROTOMY, COLECTOMY, RIGHT;  Surgeon: Delena Serve, MD;  Location: [facility];  Service: General;  Laterality: Right;    MASTECTOMY Right 2015       Social History:  Social History     Socioeconomic History    Marital status: Widowed   Tobacco Use    Smoking status: Never    Smokeless tobacco: Never   Vaping Use    Vaping Use: Never used   Substance and Sexual Activity    Alcohol use: Not Currently     Comment: occ    Drug use: No    Sexual activity: Not Currently       Family History:  Family History   Problem Relation Age of Onset    Breast cancer Neg Hx        Outpatient Medication:  Previous Medications    AMLODIPINE (NORVASC) 10 MG  TABLET    Take 1 tablet (10 mg total) by mouth daily    ANASTROZOLE (ARIMIDEX) 1 MG TABLET    Take 1 tablet (1 mg total) by mouth every morning    APOAEQUORIN (PREVAGEN PO)    Take by mouth daily    ASPIRIN 81 MG CHEWABLE TABLET    Chew 1 tablet (81 mg total) by mouth daily    DESONIDE (DESOWEN) 0.05 % OINTMENT    Apply topically 2 (two) times daily    DICLOFENAC SODIUM (VOLTAREN) 1 % GEL TOPICAL GEL    Apply 2 g topically 4 (four) times daily    EZETIMIBE (ZETIA) 10 MG TABLET    Take 1 tablet (10 mg total) by mouth daily    FERROUS SULFATE (IRON PO)    Take by mouth    GABAPENTIN (NEURONTIN) 100 MG CAPSULE    Take 1 capsule (100 mg total) by mouth 2 (two) times daily as needed (Neuropathy)    GUAIFENESIN (MUCINEX) 600 MG 12 HR TABLET    Take 2 tablets (1,200 mg total) by mouth 2 (two) times daily    LACTOBACILLUS/STREPTOCOCCUS (RISAQUAD) CAP    Take 1 capsule by mouth daily    LEVOTHYROXINE (SYNTHROID) 25 MCG TABLET    Take 1 tablet (25 mcg total) by mouth every morning    LIDOCAINE (LIDODERM) 5 %    Place 1 patch onto the skin every 24 hours Remove & Discard patch within 12 hours or as directed by MD     LISINOPRIL (ZESTRIL) 40 MG TABLET    Take 1 tablet (40 mg total) by mouth daily    MAGNESIUM OXIDE (MAG-OX) 400 MG TABLET    Take 1 tablet (400 mg total) by mouth daily    MECLIZINE (ANTIVERT) 25 MG TABLET    Take 1 tablet (25 mg total) by mouth 3 (three) times daily as needed for Dizziness    MIRTAZAPINE (REMERON) 15 MG TABLET    Take 1 tablet (15 mg total) by mouth nightly    MULTIPLE VITAMINS-MINERALS (MULTIVITAMIN WITH MINERALS) TABLET    Take 1 tablet by mouth every morning        PANTOPRAZOLE (PROTONIX) 40 MG TABLET    Take 1 tablet (40 mg total) by mouth every morning before breakfast    ROSUVASTATIN (CRESTOR) 40 MG TABLET    Take 0.5 tablets (20 mg total) by mouth daily    TRAMADOL (ULTRAM) 50 MG TABLET    Take 1 tablet (50 mg total) by mouth every 6 (six) hours as needed for Pain    VITAMIN B-1 (THIAMINE) 100 MG TABLET    Take 100 mg by mouth every morning.         REVIEW OF SYSTEMS   Review of Systems   Cardiovascular:  Positive for chest pain.   All other systems reviewed and are negative.      PHYSICAL EXAM     ED Triage Vitals   Enc Vitals Group      BP 10/15/21 0504 119/54      Heart Rate 10/15/21 0504 81      Resp --       Temp 10/15/21 0504 97.9 F (36.6 C)      Temp Source 10/15/21 0504 Oral      SpO2 10/15/21 0504 100 %      Weight --       Height --       Head Circumference --       Peak  Flow --       Pain Score 10/15/21 0503 9      Pain Loc --       Pain Edu? --       Excl. in GC? --        Nursing note and vitals reviewed.    Constitutional: non-toxic  Head: Atraumatic.  Eyes: PERRL. EOMI. No scleral icterus.  ENT: Mucous membranes are moist and intact. Oropharynx is clear. Patent airway.  Neck: Supple. No cervical lymphadenopathy.  Cardiovascular: Regular rate. Regular rhythm. No murmurs, rubs, or gallops.  Pulmonary/Chest: No evidence of respiratory distress. Clear to auscultation bilaterally. No wheezing, rales or rhonchi. Left side of chest wall is tender exactly where she indicates  pain  GI: Soft, non-distended abdomen. No tenderness to palpation of abdomen.  Extremities: No edema. No deformity.  Skin: No rash.   Neurological: Awake, alert and oriented x 3. CN II-XII intact. Strength intact. Sensation intact.  Psychiatric: Appropriate affect. Appropriate mood. Appropriate behavior.    MEDICAL DECISION MAKING     PRIMARY PROBLEM LIST     New Problem with uncertain prognosis (based on differential diagnosis) Moderate chest wall pain DIAGNOSIS  Chronic Illness Impacting Care of the above problem: N/A N/A  Diff- see below    DISCUSSION      Problems:  Chest pain. Very reproducible chest wall pain on exam. Hx of similar visit. Diff- acs, pe, ptx, chest wall pain, pleurisy. Ekg read below. Trop # 1 neg. Checking # 2. 2nd trop neg. D-dimer obtained to screen for pe. This is elevated. Cta chest to rule out PE. This is negative. I am doubtful this is acs given work-up. Referring to pcp for follow up. I discussed results, plan with pt. Follow up with pcp Monday. Return to er immediately for new/worsening illness.        External Records Reviewed?: Inpatient Records nuclear stress test 04/2019- normal, cardiology outpatient note with NP [facility] 04/2021- more in there about her atypical chronic chest pain  Additional Notes                     Vital Signs: Reviewed the patient's vital signs.   Nursing Notes: Reviewed and utilized available nursing notes.  Medical Records Reviewed: Reviewed available past medical records.  Counseling: The emergency provider has spoken with the patient and discussed today's findings, in addition to providing specific details for the plan of care.  Questions are answered and there is agreement with the plan.      MIPS DOCUMENTATION                  CARDIAC STUDIES    The following cardiac studies were independently interpreted by me the Emergency Medicine Provider.  For full cardiac study results please see chart.    EKG Interpretation:  Signed and interpreted by ED Provider    Time Interpreted: 0509  Comparison:   Rate: 72  Rhythm: nsr  Axis:   Intervals: normal  Blocks: none  ST segments: nonspecific t changes, no stemi  Interpretation: abnormal ekg    EMERGENCY IMAGING STUDIES    The following imagine studies were independently interpreted by me (emergency medicine provider):    Radiology:  Interpreted by me (ED Provider)  Study: Chest Xray   Results: cardiomegaly, no infiltrate      RADIOLOGY IMAGING STUDIES      CT Angio Chest   Final Result         1. No  evidence of pulmonary embolism.      Aldean Ast, MD    10/15/2021 9:16 AM      Chest AP Portable   Final Result      Stable cardiomegaly with no acute process.               Jorene Guest, MD    10/15/2021 5:37 AM          EMERGENCY DEPT. MEDICATIONS      ED Medication Orders (From admission, onward)      Start Ordered     Status Ordering Provider    10/15/21 0902 10/15/21 0902  iohexol (OMNIPAQUE) 350 MG/ML injection 80 mL  IMG once as needed        Route: Intravenous  Ordered Dose: 80 mL     Last MAR action: Imaging Agent Given Georgana Curio    10/15/21 0705 10/15/21 0704  ketorolac (TORADOL) injection 30 mg  Once        Route: Intravenous  Ordered Dose: 30 mg     Last MAR action: Given Georgana Curio    10/15/21 1610 10/15/21 9604  aspirin chewable tablet 324 mg  Once        Route: Oral  Ordered Dose: 324 mg     Last MAR action: Given Couper Juncaj W            LABORATORY RESULTS    Ordered and independently interpreted AVAILABLE laboratory tests.   Results       Procedure Component Value Units Date/Time    High Sensitivity Troponin-I [[id number]] Collected: 10/15/21 0703    Specimen: Blood Updated: 10/15/21 0744     hs Troponin-I <2.7 ng/L     D-Dimer [[id number]]  (Abnormal) Collected: 10/15/21 0703     Updated: 10/15/21 0728     D-Dimer 3.21 ug/mL FEU     High Sensitivity Troponin-I [[id number]] Collected: 10/15/21 0510    Specimen: Blood Updated: 10/15/21 0607     hs Troponin-I 2.7 ng/L     GFR [[id number]] Collected:  10/15/21 0510     Updated: 10/15/21 0602     EGFR >60.0    Comprehensive metabolic panel [[id number]] Collected: 10/15/21 0510    Specimen: Blood Updated: 10/15/21 0602     Glucose 100 mg/dL      BUN 25.3 mg/dL      Creatinine 1.0 mg/dL      Sodium 664 mEq/L      Potassium 4.6 mEq/L      Chloride 107 mEq/L      CO2 29 mEq/L      Calcium 9.4 mg/dL      Protein, Total 6.8 g/dL      Albumin 4.0 g/dL      AST (SGOT) 20 U/L      ALT 13 U/L      Alkaline Phosphatase 80 U/L      Bilirubin, Total 0.2 mg/dL      Globulin 2.8 g/dL      Albumin/Globulin Ratio 1.4     Anion Gap 7.0    CBC and differential [[id number]]  (Abnormal) Collected: 10/15/21 0510    Specimen: Blood Updated: 10/15/21 0545     WBC 7.75 x10 3/uL      Hgb 10.0 g/dL      Hematocrit 56.3 %      Platelets 354 x10 3/uL      RBC 3.95 x10 6/uL      MCV 84.1 fL      MCH 25.3  pg      MCHC 30.1 g/dL      RDW 14 %      MPV 10.8 fL      Instrument Absolute Neutrophil Count 3.27 x10 3/uL      Neutrophils 42.3 %      Lymphocytes Automated 48.1 %      Monocytes 7.6 %      Eosinophils Automated 1.4 %      Basophils Automated 0.5 %      Immature Granulocytes 0.1 %      Nucleated RBC 0.0 /100 WBC      Neutrophils Absolute 3.27 x10 3/uL      Lymphocytes Absolute Automated 3.73 x10 3/uL      Monocytes Absolute Automated 0.59 x10 3/uL      Eosinophils Absolute Automated 0.11 x10 3/uL      Basophils Absolute Automated 0.04 x10 3/uL      Immature Granulocytes Absolute 0.01 x10 3/uL      Absolute NRBC 0.00 x10 3/uL               CRITICAL CARE/PROCEDURES    Procedures  critical care?    DIAGNOSIS      Diagnosis:  Final diagnoses:   Chest wall pain       Disposition:  ED Disposition       ED Disposition   Discharge    Condition   --    Date/Time   Fri Oct 15, 2021  9:20 AM    Comment   Patriciaann Clan Mccaffrey discharge to home/self care.    Condition at disposition: Stable                 Prescriptions:  Patient's Medications   New Prescriptions    IBUPROFEN (ADVIL) 600 MG TABLET    Take 1  tablet (600 mg) by mouth every 6 (six) hours as needed for Pain   Previous Medications    AMLODIPINE (NORVASC) 10 MG TABLET    Take 1 tablet (10 mg total) by mouth daily    ANASTROZOLE (ARIMIDEX) 1 MG TABLET    Take 1 tablet (1 mg total) by mouth every morning    APOAEQUORIN (PREVAGEN PO)    Take by mouth daily    ASPIRIN 81 MG CHEWABLE TABLET    Chew 1 tablet (81 mg total) by mouth daily    DESONIDE (DESOWEN) 0.05 % OINTMENT    Apply topically 2 (two) times daily    DICLOFENAC SODIUM (VOLTAREN) 1 % GEL TOPICAL GEL    Apply 2 g topically 4 (four) times daily    EZETIMIBE (ZETIA) 10 MG TABLET    Take 1 tablet (10 mg total) by mouth daily    FERROUS SULFATE (IRON PO)    Take by mouth    GABAPENTIN (NEURONTIN) 100 MG CAPSULE    Take 1 capsule (100 mg total) by mouth 2 (two) times daily as needed (Neuropathy)    GUAIFENESIN (MUCINEX) 600 MG 12 HR TABLET    Take 2 tablets (1,200 mg total) by mouth 2 (two) times daily    LACTOBACILLUS/STREPTOCOCCUS (RISAQUAD) CAP    Take 1 capsule by mouth daily    LEVOTHYROXINE (SYNTHROID) 25 MCG TABLET    Take 1 tablet (25 mcg total) by mouth every morning    LIDOCAINE (LIDODERM) 5 %    Place 1 patch onto the skin every 24 hours Remove & Discard patch within 12 hours or as directed by MD    LISINOPRIL (ZESTRIL)  40 MG TABLET    Take 1 tablet (40 mg total) by mouth daily    MAGNESIUM OXIDE (MAG-OX) 400 MG TABLET    Take 1 tablet (400 mg total) by mouth daily    MECLIZINE (ANTIVERT) 25 MG TABLET    Take 1 tablet (25 mg total) by mouth 3 (three) times daily as needed for Dizziness    MIRTAZAPINE (REMERON) 15 MG TABLET    Take 1 tablet (15 mg total) by mouth nightly    MULTIPLE VITAMINS-MINERALS (MULTIVITAMIN WITH MINERALS) TABLET    Take 1 tablet by mouth every morning        PANTOPRAZOLE (PROTONIX) 40 MG TABLET    Take 1 tablet (40 mg total) by mouth every morning before breakfast    ROSUVASTATIN (CRESTOR) 40 MG TABLET    Take 0.5 tablets (20 mg total) by mouth daily    TRAMADOL (ULTRAM)  50 MG TABLET    Take 1 tablet (50 mg total) by mouth every 6 (six) hours as needed for Pain    VITAMIN B-1 (THIAMINE) 100 MG TABLET    Take 100 mg by mouth every morning.   Modified Medications    No medications on file   Discontinued Medications    No medications on file       This note was generated by the Epic EMR system/ Dragon speech recognition and may contain inherent errors or omissions not intended by the user. Grammatical errors, random word insertions, deletions and pronoun errors  are occasional consequences of this technology due to software limitations. Not all errors are caught or corrected. If there are questions or concerns about the content of this note or information contained within the body of this dictation they should be addressed directly with the author for clarification.     Georgana Curioeynolds, Coretta Leisey W, MD  10/15/21 [phone]

## 2021-10-15 NOTE — EDIE (Signed)
COLLECTIVE?NOTIFICATION?10/15/2021 04:54?Emily [name], Strader B?MRN: [id number]    [place] [facility]'s patient encounter information:   JXB:?[id number]  Account [id number]  Billing Account [id number]      Criteria Met      5 ED Visits in 12 Months    Security and Safety  No Security Events were found.  ED Care Guidelines  There are currently no ED Care Guidelines for this patient. Please check your facility's medical records system.        Prescription Monitoring Program  000??- Narcotic Use Score  000??- Sedative Use Score  000??- Stimulant Use Score  000??- Overdose Risk Score  - All Scores range from 000-999 with 75% of the population scoring < 200 and on 1% scoring above 650  - The last digit of the narcotic, sedative, and stimulant score indicates the number of active prescriptions of that type  - Higher Use scores correlate with increased prescribers, pharmacies, mg equiv, and overlapping prescriptions  - Higher Overdose Risk Scores correlate with increased risk of unintentional overdose death   Concerning or unexpectedly high scores should prompt a review of the PMP record; this does not constitute checking PMP for prescribing purposes.    E.D. Visit Count (12 mo.)  Facility Visits   [place] [facility] 7   Total 7   Note: Visits indicate total known visits.     Recent Emergency Department Visit Summary  Date Facility [facility] Type Diagnoses or Chief Complaint    Oct 15, 2021  [place] [facility]  [place]  [place]  Emergency      palpitations shortness of breath      May 22, 2021  [place] [facility]  [place]  [place]  Emergency      Abd Pain/Weakness      Dizziness      Abdominal Pain      Nausea      Diarrhea      Right lower quadrant pain      Vascular disorder of intestine, unspecified      Apr 21, 2021  [place] [facility]  [place]  [place]  Emergency      headache, body aches, leg tingling      Dizziness      Chest wall pain      Chondrocostal junction syndrome [[place]]      Mar 08, 2021  [place] [facility]  [place]  [place]  Emergency      Medic      Altered Mental Status      Altered mental status, unspecified      Mar 04, 2021  [place] - [facility]  [place]  [place]  Emergency      cough/body aches      Generalized Body Aches      Myalgia, unspecified site      Sciatica, left side      Contact with and (suspected) exposure to COVID-19      Urinary tract infection, site not specified      Jan 03, 2021  [place] [facility]  [place]  [place]  Emergency      pain in both legs/cough      Generalized weakness      Dizziness      Chest Pain      Weakness      Paresthesia of skin      Dizziness and giddiness      Chest pain,  unspecified      Nov 24, 2020  [place] [facility]  [place]  [place]  Emergency      dizziness, tingling      dizziness, tingling on left side      dizziness, tingling on left side, bodyaches      Dizziness      Polyneuropathy, unspecified      Malignant neoplasm of unspecified site of unspecified female breast        Recent Inpatient Visit Summary  Date Facility [facility] Type Diagnoses or Chief Complaint    May 22, 2021  [place] [facility]  [place]  [place]  Medical Surgical      Vascular disorder of intestine, unspecified      Right lower quadrant pain      Estrogen receptor positive status [ER+]      Malignant neoplasm of overlapping sites of right female breast        Care Team  Provider Specialty Phone Fax Service Dates   Hatch, [facility] , DO Internal Medicine   Current    Rondel Baton, M.D. Internal Medicine   Current    DAVIDSON, Janet Berlin MD Judie Petit, MD Family Medicine: Adult Medicine [phone] Current    Santa Lighter, M.D. Internal Medicine   Current    Cornelius Moras, NP Nurse Practitioner: Adult Health [phone]  Current    Sandria Bales Nurse Practitioner: Gerontology [phone] Current      Collective Portal  This patient has registered at the [facility] Emergency Department   For more information  visit: [email or web address]     PLEASE NOTE:     1.   Any care recommendations and other clinical information are provided as guidelines or for historical purposes only, and providers should exercise their own clinical judgment when providing care.    2.   You may only use this information for purposes of treatment, payment or health care operations activities, and subject to the limitations of applicable Collective Policies.    3.   You should consult directly with the organization that provided a care guideline or other clinical history with any questions about additional information or accuracy or completeness of information provided.    ? 2023 [place] - [email or web address]

## 2021-11-03 NOTE — Progress Notes (Deleted)
[facility] OFFICE VISIT    ASSESSMENT & PLAN:   No diagnosis found.       *SOB  -worsening over last month, with exertion  -normal nuclear stress in July 2020, if negative, consider seasonal allergies/lungs    *bradycardia  -14-day MCOT monitor to rule out pauses; no pauses but 3-7 beat run of SVT  -possibly contributing to SOB    *Chest pain  -unchanged from previous visits, atypical, improves with movement, palpable    *HTN  -controlled   -continue CCB, ACEi    No follow-ups on file.      HISTORY OF PRESENT ILLNESS:  I had the pleasure of seeing Emily [name] today in a cardiac follow up office visit for shortness of breath that has worsened over the last month and low heart rate. She called office on 09/08/21 and left message  reporting chest tightness but failed to return out call when we called back.    Emily [name] is a 76 y.o. female with a past medical history significant for HTN, hypercholesterolemia, hypothyroidism, breast CA with mastectomy. Hospital Dec 2021 with arm numbness but no acute event by MRI or clinical evaluation, Cervical and thoracic spondylosis. Hospitalized Aug 2022 with acute ischemic colitis with right hemicolectromy on 05/23/21.     At today's visit Emily [name] is doing well.  She denies edema, presyncope, or syncope.   She continues to report left-sided chest pain that is palpable and improves with movement this is unchanged from previous visits.  She reports shortness of breath that is worsened over the last month especially noticeable when walking upstairs to get to her home she now can only walk up 6 stairs before she needs to rest and she was previously able to walk up more than 12.  She also reports a low heart rate and has sinus bradycardia at 50 bpm today.  Her blood pressure at home runs in the 110s over 60s and her heart rate is in the 50s.  She has some dizziness when she moves from lying down to getting up but it is manageable. She has 1-2 palpitations per  week.***    Cardiographics:  TTE(Apr 2021):  LVEF 60-65%, LV Grade I DD      Nuclear stress Jul 2020: Normal without evidence of inducible ischemia or scar.     MOBILE CARDIAC TELEMETRY  Indication:  Bradycardia     Data      Hook up date:  04/02/2021                                                                   Disconnect date:  04/03/2021  Total days of monitoring:  24 hours  Resulted date:  May 02, 2021  Quality:  good   Tech comments:       Findings      Baseline rhythm:  Sinus rhythm        Impression      Normal sinus rhythm with slowest heart rate 44 bpm at 9 AM.  Rare PAC and PVC  3 SVT from 3-7 beats fastest rate 125 bpm         ZOX:WRUEA bradycardia at 50 bpm with nonspecific T wave abnormality, no significant change  PMH:   Patient Active Problem List    Diagnosis Date Noted    Ischemic colon 05/22/2021    History of colonic polyps 05/22/2021    AKI (acute kidney injury) 05/22/2021    Hypertensive emergency     Altered mental status 03/08/2021    History of hypothyroidism 09/27/2020    History of breast cancer 09/27/2020    Right lower quadrant abdominal pain 07/10/2020    Abnormal CT scan, gastrointestinal tract 07/10/2020    Dizziness 05/28/2020    Acute colitis 03/25/2020    COVID-19 ruled out by laboratory testing 03/10/2020    Left-sided weakness 01/27/2020    Left sided numbness 04/15/2019    Chest discomfort 03/15/2019    SOB (shortness of breath) 03/15/2019    Right sided numbness 11/13/2018    Hypothyroid 04/03/2018    Mixed hyperlipidemia 03/15/2018    Hypertension 01/19/2018    Malignant neoplasm of overlapping sites of right female breast 10/19/2015        MEDICATIONS:     Current Outpatient Medications   Medication Sig Dispense Refill    amLODIPine (NORVASC) 10 MG tablet Take 1 tablet (10 mg total) by mouth daily 90 tablet 1    anastrozole (ARIMIDEX) 1 MG tablet Take 1 tablet (1 mg total) by mouth every morning 30 tablet 0    Apoaequorin (PREVAGEN PO) Take by mouth daily      aspirin  81 MG chewable tablet Chew 1 tablet (81 mg total) by mouth daily 90 tablet 0    desonide (DESOWEN) 0.05 % ointment Apply topically 2 (two) times daily 15 g 0    diclofenac Sodium (VOLTAREN) 1 % Gel topical gel Apply 2 g topically 4 (four) times daily 100 g 0    ezetimibe (ZETIA) 10 MG tablet Take 1 tablet (10 mg total) by mouth daily 90 tablet 3    Ferrous Sulfate (IRON PO) Take by mouth      gabapentin (NEURONTIN) 100 MG capsule Take 1 capsule (100 mg total) by mouth 2 (two) times daily as needed (Neuropathy) 60 capsule 0    guaiFENesin (MUCINEX) 600 MG 12 hr tablet Take 2 tablets (1,200 mg total) by mouth 2 (two) times daily 14 tablet 0    ibuprofen (ADVIL) 600 MG tablet Take 1 tablet (600 mg) by mouth every 6 (six) hours as needed for Pain 30 tablet 0    lactobacillus/streptococcus (RISAQUAD) Cap Take 1 capsule by mouth daily 60 capsule 0    levothyroxine (SYNTHROID) 25 MCG tablet Take 1 tablet (25 mcg total) by mouth every morning 30 tablet 0    lidocaine (LIDODERM) 5 % Place 1 patch onto the skin every 24 hours Remove & Discard patch within 12 hours or as directed by MD 30 patch 0    lisinopril (ZESTRIL) 40 MG tablet Take 1 tablet (40 mg total) by mouth daily 90 tablet 1    magnesium oxide (MAG-OX) 400 MG tablet Take 1 tablet (400 mg total) by mouth daily 30 tablet 0    meclizine (ANTIVERT) 25 MG tablet Take 1 tablet (25 mg total) by mouth 3 (three) times daily as needed for Dizziness 10 tablet 0    mirtazapine (REMERON) 15 MG tablet Take 1 tablet (15 mg total) by mouth nightly 30 tablet 0    Multiple Vitamins-Minerals (MULTIVITAMIN WITH MINERALS) tablet Take 1 tablet by mouth every morning          pantoprazole (PROTONIX) 40 MG tablet Take 1 tablet (40 mg total) by  mouth every morning before breakfast 30 tablet 0    rosuvastatin (CRESTOR) 40 MG tablet Take 0.5 tablets (20 mg total) by mouth daily 90 tablet 3    traMADol (ULTRAM) 50 MG tablet Take 1 tablet (50 mg total) by mouth every 6 (six) hours as needed  for Pain 30 tablet 0    vitamin B-1 (THIAMINE) 100 MG tablet Take 100 mg by mouth every morning.       No current facility-administered medications for this visit.        SH:   Social History     Tobacco Use    Smoking status: Never    Smokeless tobacco: Never   Vaping Use    Vaping Use: Never used   Substance Use Topics    Alcohol use: Not Currently     Comment: occ    Drug use: No       REVIEW OF SYSTEMS: All other systems reviewed and negative except as above.    PHYSICAL EXAMINATION   General Appearance: A well-appearing female in no acute distress.   Vital Signs: There were no vitals taken for this visit.   HEENT: Sclera anicteric, conjunctiva without pallor, moist mucous membranes, normal dentition.   Neck: Supple without jugular venous distention.Normal carotid upstrokes without bruits.  Chest: Clear to auscultation bilaterally with good air movement and respiratory effort and no wheezes, rales, or rhonchi  Cardiovascular: Normal S1 and physiologically split S2 without murmurs, gallops or rub. PMI of normal size and nondisplaced.   Abdomen: Soft, nontender. No organomegaly.  No pulsatile masses or bruits.    Extremities: Warm without edema. All peripheral pulses are full and equal.  Skin: No rash, xanthoma or xanthelasma.   Neuro: Alert and oriented x3. Grossly intact. Strength is symmetrical. Normal mood and affect.       Basic Metabolic Profile   Lab Results   Component Value Date    NA 143 10/15/2021    K 4.6 10/15/2021    BUN 15.0 10/15/2021    CREAT 1.0 10/15/2021    MG 1.9 05/27/2021    CA 9.4 10/15/2021    GLU 100 10/15/2021         Cardiac Biomarkers   Lab Results   Component Value Date    BNP 35 01/03/2021        CBC with Diff   Lab Results   Component Value Date    WBC 7.75 10/15/2021    HGB 10.0 (L) 10/15/2021    HCT 33.2 (L) 10/15/2021    PLT 354 (H) 10/15/2021         Cholesterol Panel   Lab Results   Component Value Date    CHOL 196 03/09/2021    HDL 61 03/09/2021    LDL 125 (H) 03/09/2021     TRIG 50 03/09/2021         Endocrine   Lab Results   Component Value Date    HGBA1C 5.5 03/09/2021    HGBA1C 5.4 09/28/2020    HGBA1C 5.2 05/29/2020    TSH 0.56 03/08/2021         Coagulation Studies   Lab Results   Component Value Date    INR 1.1 05/22/2021    DDIMER 3.21 (H) 10/15/2021         ----------------------------  Phillis Haggis, AGNP-C  [facility]   Tel.: [phone], Fax: [phone]  [address], [place], [place] [address]  [address] [facility] [address],  [place], [place] [address]  _____________________________      Incident to service performed with physician present in the office. The physician's plan of care was implemented.

## 2021-11-04 ENCOUNTER — Ambulatory Visit (INDEPENDENT_AMBULATORY_CARE_PROVIDER_SITE_OTHER): Payer: Medicare Other | Admitting: Registered Nurse

## 2021-11-05 ENCOUNTER — Other Ambulatory Visit: Payer: Self-pay | Admitting: Hematology & Oncology

## 2021-11-05 DIAGNOSIS — C50919 Malignant neoplasm of unspecified site of unspecified female breast: Secondary | ICD-10-CM

## 2021-11-11 ENCOUNTER — Emergency Department: Payer: Medicare Other

## 2021-11-11 ENCOUNTER — Emergency Department
Admission: EM | Admit: 2021-11-11 | Discharge: 2021-11-11 | Discharge status: Against Medical Advice | Payer: Medicare Other | Attending: Emergency Medicine | Admitting: Emergency Medicine

## 2021-11-11 DIAGNOSIS — R079 Chest pain, unspecified: Secondary | ICD-10-CM | POA: Insufficient documentation

## 2021-11-11 DIAGNOSIS — R42 Dizziness and giddiness: Secondary | ICD-10-CM | POA: Insufficient documentation

## 2021-11-11 DIAGNOSIS — R55 Syncope and collapse: Secondary | ICD-10-CM | POA: Insufficient documentation

## 2021-11-11 DIAGNOSIS — N179 Acute kidney failure, unspecified: Secondary | ICD-10-CM | POA: Insufficient documentation

## 2021-11-11 DIAGNOSIS — R911 Solitary pulmonary nodule: Secondary | ICD-10-CM | POA: Insufficient documentation

## 2021-11-11 DIAGNOSIS — R202 Paresthesia of skin: Secondary | ICD-10-CM | POA: Insufficient documentation

## 2021-11-11 DIAGNOSIS — Z20822 Contact with and (suspected) exposure to covid-19: Secondary | ICD-10-CM | POA: Insufficient documentation

## 2021-11-11 DIAGNOSIS — R410 Disorientation, unspecified: Secondary | ICD-10-CM | POA: Insufficient documentation

## 2021-11-11 DIAGNOSIS — E041 Nontoxic single thyroid nodule: Secondary | ICD-10-CM

## 2021-11-11 DIAGNOSIS — R109 Unspecified abdominal pain: Secondary | ICD-10-CM | POA: Insufficient documentation

## 2021-11-11 LAB — CBC AND DIFFERENTIAL
Absolute NRBC: 0 10*3/uL (ref 0.00–0.00)
Basophils Absolute Automated: 0.04 10*3/uL (ref 0.00–0.08)
Basophils Automated: 0.6 %
Eosinophils Absolute Automated: 0.03 10*3/uL (ref 0.00–0.44)
Eosinophils Automated: 0.4 %
Hematocrit: 35.2 % (ref 34.7–43.7)
Hgb: 10.6 g/dL — ABNORMAL LOW (ref 11.4–14.8)
Immature Granulocytes Absolute: 0.02 10*3/uL (ref 0.00–0.07)
Immature Granulocytes: 0.3 %
Instrument Absolute Neutrophil Count: 4.67 10*3/uL (ref 1.10–6.33)
Lymphocytes Absolute Automated: 1.9 10*3/uL (ref 0.42–3.22)
Lymphocytes Automated: 26.1 %
MCH: 24.9 pg — ABNORMAL LOW (ref 25.1–33.5)
MCHC: 30.1 g/dL — ABNORMAL LOW (ref 31.5–35.8)
MCV: 82.6 fL (ref 78.0–96.0)
MPV: 11.1 fL (ref 8.9–12.5)
Monocytes Absolute Automated: 0.61 10*3/uL (ref 0.21–0.85)
Monocytes: 8.4 %
Neutrophils Absolute: 4.67 10*3/uL (ref 1.10–6.33)
Neutrophils: 64.2 %
Nucleated RBC: 0 /100 WBC (ref 0.0–0.0)
Platelets: 366 10*3/uL — ABNORMAL HIGH (ref 142–346)
RBC: 4.26 10*6/uL (ref 3.90–5.10)
RDW: 15 % (ref 11–15)
WBC: 7.27 10*3/uL (ref 3.10–9.50)

## 2021-11-11 LAB — ECG 12-LEAD
IHS MUSE NARRATIVE AND IMPRESSION: NORMAL
P Axis: 54 degrees
P-R Interval: 182 ms
Q-T Interval: 346 ms
QTC Calculation (Bezet): 381 ms
T Axis: 10 degrees
Ventricular Rate: 73 {beats}/min

## 2021-11-11 LAB — COMPREHENSIVE METABOLIC PANEL
ALT: 15 U/L (ref 0–55)
AST (SGOT): 28 U/L (ref 5–41)
Albumin/Globulin Ratio: 1.4 (ref 0.9–2.2)
Albumin: 4.8 g/dL (ref 3.5–5.0)
Alkaline Phosphatase: 78 U/L (ref 37–117)
Anion Gap: 11 (ref 5.0–15.0)
BUN: 19 mg/dL (ref 7.0–21.0)
Bilirubin, Total: 0.2 mg/dL (ref 0.2–1.2)
CO2: 25 mEq/L (ref 17–29)
Calcium: 10.4 mg/dL — ABNORMAL HIGH (ref 7.9–10.2)
Chloride: 104 mEq/L (ref 99–111)
Creatinine: 1.3 mg/dL — ABNORMAL HIGH (ref 0.4–1.0)
Globulin: 3.4 g/dL (ref 2.0–3.6)
Glucose: 104 mg/dL — ABNORMAL HIGH (ref 70–100)
Potassium: 4.7 mEq/L (ref 3.5–5.3)
Protein, Total: 8.2 g/dL (ref 6.0–8.3)
Sodium: 140 mEq/L (ref 135–145)

## 2021-11-11 LAB — COVID-19 (SARS-COV-2) & INFLUENZA  A/B, NAA (ROCHE LIAT)
Influenza A: NOT DETECTED
Influenza B: NOT DETECTED
SARS CoV 2 Overall Result: NOT DETECTED

## 2021-11-11 LAB — URINALYSIS REFLEX TO MICROSCOPIC EXAM - REFLEX TO CULTURE
Bilirubin, UA: NEGATIVE
Blood, UA: NEGATIVE
Glucose, UA: NEGATIVE
Ketones UA: NEGATIVE
Leukocyte Esterase, UA: NEGATIVE
Nitrite, UA: NEGATIVE
Protein, UR: NEGATIVE
Specific Gravity UA: 1.006 (ref 1.001–1.035)
Urine pH: 6 (ref 5.0–8.0)
Urobilinogen, UA: NEGATIVE mg/dL (ref 0.2–2.0)

## 2021-11-11 LAB — PT AND APTT
PT INR: 1 (ref 0.9–1.1)
PT: 11.2 s (ref 10.1–12.9)
PTT: 39 s (ref 27–39)

## 2021-11-11 LAB — GFR: EGFR: 48.1

## 2021-11-11 LAB — HIGH SENSITIVITY TROPONIN-I: hs Troponin-I: 2.7 ng/L

## 2021-11-11 MED ORDER — LIDOCAINE 5 % EX PTCH
1.0000 | MEDICATED_PATCH | CUTANEOUS | 0 refills | Status: DC
Start: 2021-11-11 — End: 2021-11-17

## 2021-11-11 MED ORDER — SODIUM CHLORIDE 0.9 % IV BOLUS
500.0000 mL | Freq: Once | INTRAVENOUS | Status: AC
Start: 2021-11-11 — End: 2021-11-11
  Administered 2021-11-11: 500 mL via INTRAVENOUS

## 2021-11-11 MED ORDER — MECLIZINE HCL 25 MG PO TABS
25.0000 mg | ORAL_TABLET | Freq: Three times a day (TID) | ORAL | 0 refills | Status: DC | PRN
Start: 2021-11-11 — End: 2022-10-30

## 2021-11-11 MED ORDER — MECLIZINE HCL 12.5 MG PO TABS
25.0000 mg | ORAL_TABLET | Freq: Once | ORAL | Status: AC
Start: 2021-11-11 — End: 2021-11-11
  Administered 2021-11-11: 25 mg via ORAL
  Filled 2021-11-11: qty 2

## 2021-11-11 MED ORDER — LIDOCAINE 5 % EX PTCH
2.0000 | MEDICATED_PATCH | Freq: Once | CUTANEOUS | Status: DC
Start: 2021-11-11 — End: 2021-11-11
  Administered 2021-11-11: 2 via TRANSDERMAL
  Filled 2021-11-11: qty 2

## 2021-11-11 MED ORDER — ACETAMINOPHEN 325 MG PO TABS
650.0000 mg | ORAL_TABLET | Freq: Once | ORAL | Status: AC
Start: 2021-11-11 — End: 2021-11-11
  Administered 2021-11-11: 650 mg via ORAL
  Filled 2021-11-11: qty 2

## 2021-11-11 NOTE — ED Provider Notes (Signed)
EMERGENCY DEPARTMENT NOTE     Patient initially seen and examined at   ED PHYSICIAN ASSIGNED       Date/Time Event User Comments    11/11/21 1522 Physician Assigned Karenann Cai, Jacelyn Pi, MD assigned as Attending           ED MIDLEVEL (APP) ASSIGNED       Date/Time Event User Comments    11/11/21 1521 PA/NP Provider Assigned Murrell Converse, Gretta Samons Sheldon Silvan, DNP FNP assigned as Nurse Practitioner            HISTORY OF PRESENT ILLNESS   Translator Used : No    Chief Complaint: Chest Pain and Syncope       76 y.o. female with past medical history as below presents with generalized body aches, generalized weakness, tingling from head down to there toes, room spinning dizziness, chest pain, left breast tenderness, abdominal pain with nausea and dysuria that began last night around 1800 while watching TV. Patient states she feels confused. Had a syncopal episode after her shower while putting on her clothes. Unknown duration of LOC.   Denies fever chills, cough congestion, emesis, diarrhea, slurred speech, facial droop, blurry vision, double vision, unilateral weakness, shortness of breath.     Independent Historian (other than patient): No  Additional History Provided by Independent Historian:  MEDICAL HISTORY     Past Medical History:  Past Medical History:   Diagnosis Date    Breast cancer 2014    right breast mastectomy    Exercise-induced angina     negative work up    GERD (gastroesophageal reflux disease)     Headache     resolved    Hyperlipidemia     Hypertension     Hypothyroidism     Low back pain     Malignant neoplasm of overlapping sites of right female breast 10/19/2015    Syncope and collapse     Chronic Vertigo    Vertigo        Past Surgical History:  Past Surgical History:   Procedure Laterality Date    COLONOSCOPY  2021    COLONOSCOPY, POLYPECTOMY N/A 07/21/2020    Procedure: COLONOSCOPY, POLYPECTOMY;  Surgeon: Einar Grad, MD;  Location: [facility];  Service: Gastroenterology;  Laterality:  N/A;    EGD, BIOPSY N/A 07/21/2020    Procedure: EGD, BIOPSY;  Surgeon: Einar Grad, MD;  Location: [facility];  Service: Gastroenterology;  Laterality: N/A;    EXPLORATORY LAPAROTOMY N/A 05/22/2021    Procedure: EXPLORATORY LAPAROTOMY;  Surgeon: Delena Serve, MD;  Location: [facility];  Service: General;  Laterality: N/A;    HYSTERECTOMY      LAPAROSCOPY, DIAGNOSTIC N/A 05/22/2021    Procedure: LAPAROSCOPY, DIAGNOSTIC;  Surgeon: Delena Serve, MD;  Location: [facility];  Service: General;  Laterality: N/A;    LAPAROTOMY, COLECTOMY, RIGHT Right 05/22/2021    Procedure: LAPAROTOMY, COLECTOMY, RIGHT;  Surgeon: Delena Serve, MD;  Location: [facility];  Service: General;  Laterality: Right;    MASTECTOMY Right 2015       Social History:  Social History     Socioeconomic History    Marital status: Widowed   Tobacco Use    Smoking status: Never    Smokeless tobacco: Never   Vaping Use    Vaping Use: Never used   Substance and Sexual Activity    Alcohol use: Not Currently     Comment: occ  Drug use: No    Sexual activity: Not Currently       Family History:  Family History   Problem Relation Age of Onset    Breast cancer Neg Hx        Outpatient Medication:  Discharge Medication List as of 11/11/2021  7:08 PM        CONTINUE these medications which have NOT CHANGED    Details   amLODIPine (NORVASC) 10 MG tablet Take 1 tablet (10 mg total) by mouth daily, Starting Fri 04/02/2021, E-Rx      anastrozole (ARIMIDEX) 1 MG tablet Take 1 tablet (1 mg total) by mouth every morning, Starting Tue 06/01/2021, Normal      Apoaequorin (PREVAGEN PO) Take by mouth daily, Historical Med      aspirin 81 MG chewable tablet Chew 1 tablet (81 mg total) by mouth daily, Starting Wed 01/29/2020, E-Rx      desonide (DESOWEN) 0.05 % ointment Apply topically 2 (two) times daily, Starting Sun 05/10/2020, E-Rx      diclofenac Sodium (VOLTAREN) 1 % Gel topical gel Apply 2 g topically 4 (four) times daily, Starting Thu  03/04/2021, Normal      ezetimibe (ZETIA) 10 MG tablet Take 1 tablet (10 mg total) by mouth daily, Starting Tue 10/20/2020, E-Rx      Ferrous Sulfate (IRON PO) Take by mouth, Historical Med      gabapentin (NEURONTIN) 100 MG capsule Take 1 capsule (100 mg total) by mouth 2 (two) times daily as needed (Neuropathy), Starting Tue 06/01/2021, Print      guaiFENesin (MUCINEX) 600 MG 12 hr tablet Take 2 tablets (1,200 mg total) by mouth 2 (two) times daily, Starting Sun 08/30/2020, E-Rx      ibuprofen (ADVIL) 600 MG tablet Take 1 tablet (600 mg) by mouth every 6 (six) hours as needed for Pain, Starting Fri 10/15/2021, E-Rx      lactobacillus/streptococcus (RISAQUAD) Cap Take 1 capsule by mouth daily, Starting Tue 06/01/2021, E-Rx      levothyroxine (SYNTHROID) 25 MCG tablet Take 1 tablet (25 mcg total) by mouth every morning, Starting Wed 01/29/2020, E-Rx      lisinopril (ZESTRIL) 40 MG tablet Take 1 tablet (40 mg total) by mouth daily, Starting Fri 04/02/2021, E-Rx      magnesium oxide (MAG-OX) 400 MG tablet Take 1 tablet (400 mg total) by mouth daily, Starting Fri 05/29/2020, E-Rx      mirtazapine (REMERON) 15 MG tablet Take 1 tablet (15 mg total) by mouth nightly, Starting Tue 06/01/2021, E-Rx      Multiple Vitamins-Minerals (MULTIVITAMIN WITH MINERALS) tablet Take 1 tablet by mouth every morning    , Historical Med      pantoprazole (PROTONIX) 40 MG tablet Take 1 tablet (40 mg total) by mouth every morning before breakfast, Starting Wed 01/29/2020, E-Rx      rosuvastatin (CRESTOR) 40 MG tablet Take 0.5 tablets (20 mg total) by mouth daily, Starting Mon 09/28/2020, Print      traMADol (ULTRAM) 50 MG tablet Take 1 tablet (50 mg total) by mouth every 6 (six) hours as needed for Pain, Starting Tue 06/01/2021, Print      vitamin B-1 (THIAMINE) 100 MG tablet Take 100 mg by mouth every morning., Historical Med               REVIEW OF SYSTEMS   Review of Systems See History of Present Illness  PHYSICAL EXAM     ED Triage Vitals [11/11/21  1452]   Enc  Vitals Group      BP 121/69      Heart Rate 74      Resp Rate 16      Temp 98.3 F (36.8 C)      Temp Source Oral      SpO2 98 %      Weight 65.8 kg      Height 1.6 m      Head Circumference       Peak Flow       Pain Score 5      Pain Loc       Pain Edu?       Excl. in GC?      Physical Exam   Nursing note and vitals reviewed.  Constitutional:  Well developed, well nourished. Awake & Oriented x3.  Head:  Atraumatic. Normocephalic.    Eyes:  PERRL. EOMI. Conjunctivae are not pale.  No nystagmus.  ENT:  Mucous membranes are moist and intact. Oropharynx is clear and symmetric.  Patent airway.  Neck:  Supple. Full ROM.    Cardiovascular:  Regular rate. Regular rhythm. No murmurs, rubs, or gallops.  Pulmonary/Chest:  No evidence of respiratory distress. Clear to auscultation bilaterally.  No wheezing, rales or rhonchi.   Abdominal:  Soft and non-distended. There is no tenderness. No rebound, guarding, or rigidity.  Right CVA tenderness.    Back:  Full ROM. Nontender.  Extremities:  No edema. No cyanosis. No clubbing. Full range of motion in all extremities.  Skin:  Skin is warm and dry.  No diaphoresis. No rash.   Neurological:  Alert and oriented to person, place, and time.  PERRL.  EOMI.  No visual field deficit. Cranial nerves II-XII are intact.  Strength is equal and 5/5 in the upper extremities bilaterally. Left lower leg weakness compared to right without drift.   No sensory deficits to light touch.  No pronator drift.  Normal speech.  Normal cerebellar function during finger-nose-finger and heel to shin.  DTR's are 2+ and equal throughout.  No acute focal neurological deficits can be appreciated.  Psychiatric:  Good eye contact. Normal interaction, affect, and behavior.    MEDICAL DECISION MAKING     PRIMARY PROBLEM LIST       DIAGNOSIS: Generalized weakness; Dizziness       DISCUSSION      76 yo female with a hx of chronic vertigo, HTN, hypothyroidism, angina, GERD, and breast cancer s/p right  mastectomy previously on chemotherapy was evaluated for generalized tingling, general weakness, dizziness, chest pain, abdominal pain, and confusion.    Patient had left lower extremity weakness compared to right on exam. However, patient was holding her left hip during assessment.  No bony tenderness. No drift noted.   No midline tenderness of lumbar spine.  Otherwise normal neurological exam.  Patient ambulatory in ED without difficulty.  Low suspicion CVA.  Symptoms appear to be vertigo in nature.  Concern for syncope which may likely be due to vasovagal or orthostatic hypotension.  Unknown duration of LOC, per patient. Patient confused about vertigo, when asked if her dizziness feels the same as before. Unable to recall the president's name, but knew he had made a speech last night on TV. Possible acute cystitis causing mild confusion.     Plan for CT head to rule out mass given history of breast cancer.  Left breast tenderness noted without evidence of erythema, swelling, or warmth for concerns of cellulitis or infection.  Low suspicion chest  pain is of cardiac etiology concerning ACS.  Plan for cardiac work-up as well as abdominal work-up.    CT head negative for acute mass or ICH. No ischemia noted. CT abd/pelvis negative for obstruction or inflammation.  Chest x-ray negative for opacities or effusions.  Negative orthostatics.    1810: Patient states her dizziness has improved after p.o. meclizine.  IV fluids currently infusing.  Results discussed patient and recommended admission for several different reasons including syncope and AKI.  Discussed CT findings of no visualized mass but may need MRI follow-up to assess for possible cortical lesions.    Patient declined admission.  Risks of worsening of current conditions, undiagnosed conditions, permanent disability, and death discussed.    Patient's mental status reassessed and noted to be A&Ox4.  Patient was able to tell me her name, DOB, president, which  facility she is currently at, and her primary care doctor's name.  No respiratory distress or hypoxia.  Verbalized understanding of the risks of leaving AGAINST MEDICAL ADVICE.  1840: Patient turned over to [facility], NP pending UA and AMA.      If patient is being hospitalized is severe sepsis or septic shock suspected?: N/A          External Records Reviewed?: Inpatient Records    Additional Notes                     Vital Signs: Reviewed the patient's vital signs.   Nursing Notes: Reviewed and utilized available nursing notes.  Medical Records Reviewed: Reviewed available past medical records.  Counseling: The emergency provider has spoken with the patient and discussed today's findings, in addition to providing specific details for the plan of care.  Questions are answered and there is agreement with the plan.      MIPS DOCUMENTATION              CARDIAC STUDIES    The following cardiac studies were independently interpreted by me the Emergency Medicine Provider.  For full cardiac study results please see chart.    EKG 1 interpreted by me (ED provider)  Comparison: Yes.  No acute changes.  DATE: 10/15/21  Rate: 60-100  Rhythm: Normal Sinus Rhythm  ST segments: No acute changes  EKG interpretation: Nonspecific                EMERGENCY IMAGING STUDIES    The following imagine studies were independently interpreted by me (emergency medicine provider):               CT Head Interpreted by me (ED Provider)  Comparison: Yes.  No acute changes.  DATE: 03/08/21  RESULT: No Hemorrhage or Mass Effect  IMPRESSION: No acute abnormality  RADIOLOGY IMAGING STUDIES      CT Abd/Pelvis without Contrast   Final Result      1.  No acute findings.   2.  Ancillary findings as described above.      Ewing Schlein MD, MD    11/11/2021 6:05 PM      CT Cervical Spine without Contrast   Final Result      1.  No acute fracture or subluxation.   2.  Multilevel degenerative changes of cervical spine as detailed above.   3.  Heterogeneous and  enlarged thyroid gland. Large right thyroid lobe   nodule qualifies for further evaluation with ultrasound. Please consider   evaluation on a nonemergent basis, if not previously performed.      Nelya Ebadirad  11/11/2021 6:11 PM      CT Head WO Contrast   Final Result       No acute intracranial abnormality within limitations of CT. Please note   head CT is suboptimal for evaluation of cortical lesions. If warranted   this should be further evaluated with dedicated brain MRI without and   with contrast.      Nelya Ebadirad    11/11/2021 6:02 PM      Chest AP Portable   Final Result      Low lung volumes. Otherwise, no acute cardiopulmonary findings.       Nelya Ebadirad    11/11/2021 5:35 PM          EMERGENCY DEPT. MEDICATIONS      ED Medication Orders (From admission, onward)      Start Ordered     Status Ordering Provider    11/11/21 1611 11/11/21 1610  acetaminophen (TYLENOL) tablet 650 mg  Once        Route: Oral  Ordered Dose: 650 mg     Last MAR action: Given Lenix Benoist D    11/11/21 1611 11/11/21 1610    Once in ED        Route: Transdermal  Ordered Dose: 2 patch     Discontinued Zeenat Jeanbaptiste D    11/11/21 1559 11/11/21 1558  meclizine (ANTIVERT) tablet 25 mg  Once        Route: Oral  Ordered Dose: 25 mg     Last MAR action: Given Aleja Yearwood D    11/11/21 1559 11/11/21 1558  sodium chloride 0.9 % bolus 500 mL  Once        Route: Intravenous  Ordered Dose: 500 mL     Last MAR action: Stopped Kynedi Profitt D            LABORATORY RESULTS    Ordered and independently interpreted AVAILABLE laboratory tests.   Results       Procedure Component Value Units Date/Time    Urinalysis Reflex to Microscopic Exam- Reflex to Culture [[id number]] Collected: 11/11/21 1726     Updated: 11/11/21 1855     Urine Type Urine, Clean Ca     Color, UA Straw     Clarity, UA Clear     Specific Gravity UA 1.006     Urine pH 6.0     Leukocyte Esterase, UA Negative     Nitrite, UA Negative     Protein, UR Negative     Glucose, UA Negative      Ketones UA Negative     Urobilinogen, UA Negative mg/dL      Bilirubin, UA Negative     Blood, UA Negative     RBC, UA 0 - 2 /hpf      WBC, UA 0 - 5 /hpf     COVID-19 (SARS-CoV-2) and Influenza A/B, NAA (Liat Rapid)- Age 36 and above [[id number]] Collected: 11/11/21 1633    Specimen: Culturette from Nasopharyngeal Updated: 11/11/21 1708     Purpose of COVID testing Diagnostic -[facility]     SARS-CoV-2 Specimen Source Nasal Swab     SARS CoV 2 Overall Result Not Detected     Influenza A Not Detected     Influenza B Not Detected    Narrative:      o Collect and clearly label specimen type:  o PREFERRED-Upper respiratory specimen: One Nasal Swab in  Transport Media.  o Hand deliver to laboratory ASAP  Diagnostic -[facility]  Comprehensive metabolic panel [[id number]]  (Abnormal) Collected: 11/11/21 1633     Updated: 11/11/21 1701     Glucose 104 mg/dL      BUN 35.0 mg/dL      Creatinine 1.3 mg/dL      Sodium 093 mEq/L      Potassium 4.7 mEq/L      Chloride 104 mEq/L      CO2 25 mEq/L      Calcium 10.4 mg/dL      Protein, Total 8.2 g/dL      Albumin 4.8 g/dL      AST (SGOT) 28 U/L      ALT 15 U/L      Alkaline Phosphatase 78 U/L      Bilirubin, Total 0.2 mg/dL      Globulin 3.4 g/dL      Albumin/Globulin Ratio 1.4     Anion Gap 11.0    GFR [[id number]] Collected: 11/11/21 1633     Updated: 11/11/21 1701     EGFR 48.1    PT/APTT [[id number]] Collected: 11/11/21 1633     Updated: 11/11/21 1651     PT 11.2 sec      PT INR 1.0     PTT 39 sec     High Sensitivity Troponin-I [[id number]] Collected: 11/11/21 1505    Specimen: Blood Updated: 11/11/21 1545     hs Troponin-I 2.7 ng/L     CBC and differential [[id number]]  (Abnormal) Collected: 11/11/21 1505    Specimen: Blood Updated: 11/11/21 1520     WBC 7.27 x10 3/uL      Hgb 10.6 g/dL      Hematocrit 53.6 %      Platelets 366 x10 3/uL      RBC 4.26 x10 6/uL      MCV 82.6 fL      MCH 24.9 pg      MCHC 30.1 g/dL      RDW 15 %      MPV 11.1 fL      Instrument Absolute Neutrophil Count 4.67 x10  3/uL      Neutrophils 64.2 %      Lymphocytes Automated 26.1 %      Monocytes 8.4 %      Eosinophils Automated 0.4 %      Basophils Automated 0.6 %      Immature Granulocytes 0.3 %      Nucleated RBC 0.0 /100 WBC      Neutrophils Absolute 4.67 x10 3/uL      Lymphocytes Absolute Automated 1.90 x10 3/uL      Monocytes Absolute Automated 0.61 x10 3/uL      Eosinophils Absolute Automated 0.03 x10 3/uL      Basophils Absolute Automated 0.04 x10 3/uL      Immature Granulocytes Absolute 0.02 x10 3/uL      Absolute NRBC 0.00 x10 3/uL               CRITICAL CARE/PROCEDURES    Procedures    DIAGNOSIS      Diagnosis:  Final diagnoses:   Syncope, unspecified syncope type   AKI (acute kidney injury)   Dizziness   Tingling   Chest pain, unspecified type   Right thyroid nodule       Disposition:  ED Disposition       ED Disposition   AMA    Condition   --    Date/Time   Thu Nov 11, 2021  6:33 PM    Comment  Date: 11/11/2021  Patient: Emily [name]  Admitted: 11/11/2021  3:18 PM  Attending Provider: No att. providers found    Deedra Ehrich Nasworthy or her authorized caregiver has made the decision for the patient to leave the emergency department agains t the advice of her attending physician. She or her authorized caregiver has been informed and understands the inherent risks, including death, recurrent syncopal episode, dizziness, head injury, ICH.  She or her authorized caregiver has decided to a ccept the responsibility for this decision. Deedra Ehrich Lilja and all necessary parties have been advised that she may return for further evaluation or treatment. Her condition at time of discharge was stable.  Deedra Ehrich Blitzer had current vit al signs as follows:  BP 133/64   Pulse 88   Temp 98.3 F (36.8 C) (Oral)   Resp 16   Ht 5\' 3"  (1.6 m)   Wt 65.8 kg                  Prescriptions:  Discharge Medication List as of 11/11/2021  7:08 PM        CONTINUE these medications which have CHANGED    Details   lidocaine  (LIDODERM) 5 % Place 1 patch onto the skin every 24 hours for 14 days Remove & Discard patch within 12 hours or as directed by MD, Starting Thu 11/11/2021, Until Thu 11/25/2021, E-Rx      meclizine (ANTIVERT) 25 MG tablet Take 1 tablet (25 mg) by mouth 3 (three) times daily as needed for Dizziness, Starting Thu 11/11/2021, E-Rx           CONTINUE these medications which have NOT CHANGED    Details   amLODIPine (NORVASC) 10 MG tablet Take 1 tablet (10 mg total) by mouth daily, Starting Fri 04/02/2021, E-Rx      anastrozole (ARIMIDEX) 1 MG tablet Take 1 tablet (1 mg total) by mouth every morning, Starting Tue 06/01/2021, Normal      Apoaequorin (PREVAGEN PO) Take by mouth daily, Historical Med      aspirin 81 MG chewable tablet Chew 1 tablet (81 mg total) by mouth daily, Starting Wed 01/29/2020, E-Rx      desonide (DESOWEN) 0.05 % ointment Apply topically 2 (two) times daily, Starting Sun 05/10/2020, E-Rx      diclofenac Sodium (VOLTAREN) 1 % Gel topical gel Apply 2 g topically 4 (four) times daily, Starting Thu 03/04/2021, Normal      ezetimibe (ZETIA) 10 MG tablet Take 1 tablet (10 mg total) by mouth daily, Starting Tue 10/20/2020, E-Rx      Ferrous Sulfate (IRON PO) Take by mouth, Historical Med      gabapentin (NEURONTIN) 100 MG capsule Take 1 capsule (100 mg total) by mouth 2 (two) times daily as needed (Neuropathy), Starting Tue 06/01/2021, Print      guaiFENesin (MUCINEX) 600 MG 12 hr tablet Take 2 tablets (1,200 mg total) by mouth 2 (two) times daily, Starting Sun 08/30/2020, E-Rx      ibuprofen (ADVIL) 600 MG tablet Take 1 tablet (600 mg) by mouth every 6 (six) hours as needed for Pain, Starting Fri 10/15/2021, E-Rx      lactobacillus/streptococcus (RISAQUAD) Cap Take 1 capsule by mouth daily, Starting Tue 06/01/2021, E-Rx      levothyroxine (SYNTHROID) 25 MCG tablet Take 1 tablet (25 mcg total) by mouth every morning, Starting Wed 01/29/2020, E-Rx      lisinopril (ZESTRIL) 40 MG tablet Take 1 tablet (40 mg total) by mouth  daily, Starting Fri 04/02/2021, E-Rx      magnesium oxide (MAG-OX) 400 MG tablet Take 1 tablet (400 mg total) by mouth daily, Starting Fri 05/29/2020, E-Rx      mirtazapine (REMERON) 15 MG tablet Take 1 tablet (15 mg total) by mouth nightly, Starting Tue 06/01/2021, E-Rx      Multiple Vitamins-Minerals (MULTIVITAMIN WITH MINERALS) tablet Take 1 tablet by mouth every morning    , Historical Med      pantoprazole (PROTONIX) 40 MG tablet Take 1 tablet (40 mg total) by mouth every morning before breakfast, Starting Wed 01/29/2020, E-Rx      rosuvastatin (CRESTOR) 40 MG tablet Take 0.5 tablets (20 mg total) by mouth daily, Starting Mon 09/28/2020, Print      traMADol (ULTRAM) 50 MG tablet Take 1 tablet (50 mg total) by mouth every 6 (six) hours as needed for Pain, Starting Tue 06/01/2021, Print      vitamin B-1 (THIAMINE) 100 MG tablet Take 100 mg by mouth every morning., Historical Med                 This note was generated by the [employer] EMR system/ Dragon speech recognition and may contain inherent errors or omissions not intended by the user. Grammatical errors, random word insertions, deletions and pronoun errors  are occasional consequences of this technology due to software limitations. Not all errors are caught or corrected. If there are questions or concerns about the content of this note or information contained within the body of this dictation they should be addressed directly with the author for clarification.     Sheldon Silvan, DNP FNP  11/13/21 [phone]

## 2021-11-11 NOTE — EDIE (Signed)
COLLECTIVE?NOTIFICATION?11/11/2021 14:39?[name], Emily [name]?MRN: [id number]    [place] [facility] information:   VWU:[id number]  Account [id number]  Billing Account [id number]      Criteria Met      5 ED Visits in 12 Months    Security and Safety  No Security Events were found.  ED Care Guidelines  There are currently no ED Care Guidelines for this patient. Please check your facility's medical records system.        Prescription Monitoring Program  000??- Narcotic Use Score  000??- Sedative Use Score  000??- Stimulant Use Score  000??- Overdose Risk Score  - All Scores range from 000-999 with 75% of the population scoring < 200 and on 1% scoring above 650  - The last digit of the narcotic, sedative, and stimulant score indicates the number of active prescriptions of that type  - Higher Use scores correlate with increased prescribers, pharmacies, mg equiv, and overlapping prescriptions  - Higher Overdose Risk Scores correlate with increased risk of unintentional overdose death   Concerning or unexpectedly high scores should prompt a review of the PMP record; this does not constitute checking PMP for prescribing purposes.    E.D. Visit Count (12 mo.)  Facility Visits   [place] [facility]   Total 8   Note: Visits indicate total known visits.     Recent Emergency Department Visit Summary  [facility] Type Diagnoses or Chief Complaint    Nov 11, 2021  [place] [facility]  [place]  [place]  Emergency      chest pain,dizziness,weakness      Oct 15, 2021  [place] [facility]  [place]  [place]  Emergency      palpitations shortness of breath      Palpitations      Other chest pain      May 22, 2021  [place] [facility]  [place]  [place]  Emergency      Abd Pain/Weakness      Dizziness      Abdominal Pain      Nausea      Diarrhea      Right lower quadrant pain      Vascular disorder of intestine, unspecified      Apr 21, 2021  [place] [facility]  [place]  [place]   Emergency      headache, body aches, leg tingling      Dizziness      Chest wall pain      Chondrocostal junction syndrome [Tietze]      Mar 08, 2021  [place] [facility]  [place]  [place]  Emergency      Medic      Altered Mental Status      Altered mental status, unspecified      Mar 04, 2021  [place] [facility]  [place]  [place]  Emergency      cough/body aches      Generalized Body Aches      Myalgia, unspecified site      Sciatica, left side      Contact with and (suspected) exposure to COVID-19      Urinary tract infection, site not specified      Jan 03, 2021  [place] [facility]  [place]  [place]  Emergency      pain in both legs/cough      Generalized weakness  Dizziness      Chest Pain      Weakness      Paresthesia of skin      Dizziness and giddiness      Chest pain, unspecified      Nov 24, 2020  [place] [facility]  [place]  [place]  Emergency      dizziness, tingling      dizziness, tingling on left side      dizziness, tingling on left side, bodyaches      Dizziness      Polyneuropathy, unspecified      Malignant neoplasm of unspecified site of unspecified female breast        Recent Inpatient Visit Summary  Date Facility [facility] Type Diagnoses or Chief Complaint    May 22, 2021  [place] - [facility]  [place]  [place]  Medical Surgical      Vascular disorder of intestine, unspecified      Right lower quadrant pain      Estrogen receptor positive status [ER+]      Malignant neoplasm of overlapping sites of right female breast        Care Team  Provider Specialty Phone Fax Service Dates   [place], [facility] , DO Internal Medicine   Current    Rondel Baton, M.D. Internal Medicine   Current    DAVIDSON, Janet Berlin MD Judie Petit, MD Family Medicine: Adult Medicine [phone] Current    Santa Lighter, M.D. Internal Medicine   Current    Cornelius Moras, NP Nurse Practitioner: Adult Health [phone]  Current    Sandria Bales Nurse Practitioner: Gerontology [phone]  Current       Collective Portal  This patient has registered at the [facility] Emergency Department   For more information visit: [email or web address]     PLEASE NOTE:     1.   Any care recommendations and other clinical information are provided as guidelines or for historical purposes only, and providers should exercise their own clinical judgment when providing care.    2.   You may only use this information for purposes of treatment, payment or health care operations activities, and subject to the limitations of applicable Collective Policies.    3.   You should consult directly with the organization that provided a care guideline or other clinical history with any questions about additional information or accuracy or completeness of information provided.    ? 2023 [place], [employer] - [email or web address]

## 2021-11-11 NOTE — Discharge Instructions (Addendum)
You were in the emergency department for syncopal episode, dizziness, chest pain, abdominal pain, generalized weakness.    We have recommended admission for your syncopal episode and further evaluation.  However, you declined admission and understood the risks of leaving AGAINST MEDICAL ADVICE.    Please change positions slowly.  Take meclizine as prescribed for your dizziness.  Plenty of fluids for hydration.  Follow-up with your primary care doctor soon as possible regarding your CAT scan findings of enlarged right thyroid lobe nodule.    Please follow-up with neurology and cardiology as well.    If symptoms worsen, return to the emergency department.

## 2021-11-11 NOTE — ED Notes (Signed)
Pt leaving AMA, given clinical references and instructions for f/u. Pt ambulated to d/c area in NAD.

## 2021-11-14 LAB — ECG 12-LEAD
Atrial Rate: 74 {beats}/min
Atrial Rate: 73 {beats}/min
P-R Interval: 136 ms
Q-T Interval: 354 ms
QRS Duration: 64 ms
QRS Duration: 86 ms
QTC Calculation (Bezet): 392 ms
R Axis: 3 degrees
R Axis: 32 degrees
T Axis: 90 degrees
Ventricular Rate: 74 {beats}/min

## 2021-11-17 ENCOUNTER — Emergency Department: Payer: Medicare Other

## 2021-11-17 ENCOUNTER — Emergency Department
Admission: EM | Admit: 2021-11-17 | Discharge: 2021-11-17 | Disposition: A | Discharge status: Home / Self Care | Payer: Medicare Other | Attending: Student in an Organized Health Care Education/Training Program | Admitting: Student in an Organized Health Care Education/Training Program

## 2021-11-17 ENCOUNTER — Ambulatory Visit (INDEPENDENT_AMBULATORY_CARE_PROVIDER_SITE_OTHER): Payer: Self-pay | Admitting: Cardiology

## 2021-11-17 DIAGNOSIS — K8020 Calculus of gallbladder without cholecystitis without obstruction: Secondary | ICD-10-CM | POA: Insufficient documentation

## 2021-11-17 DIAGNOSIS — Z20822 Contact with and (suspected) exposure to covid-19: Secondary | ICD-10-CM | POA: Insufficient documentation

## 2021-11-17 DIAGNOSIS — R109 Unspecified abdominal pain: Secondary | ICD-10-CM

## 2021-11-17 LAB — URINALYSIS REFLEX TO MICROSCOPIC EXAM - REFLEX TO CULTURE
Bilirubin, UA: NEGATIVE
Blood, UA: NEGATIVE
Glucose, UA: NEGATIVE
Ketones UA: NEGATIVE
Nitrite, UA: NEGATIVE
Protein, UR: NEGATIVE
Specific Gravity UA: 1.006 (ref 1.001–1.035)
Urine pH: 7 (ref 5.0–8.0)
Urobilinogen, UA: NEGATIVE mg/dL (ref 0.2–2.0)

## 2021-11-17 LAB — CBC AND DIFFERENTIAL
Absolute NRBC: 0 10*3/uL (ref 0.00–0.00)
Basophils Absolute Automated: 0.03 10*3/uL (ref 0.00–0.08)
Basophils Automated: 0.4 %
Eosinophils Absolute Automated: 0.05 10*3/uL (ref 0.00–0.44)
Eosinophils Automated: 0.6 %
Hematocrit: 34.4 % — ABNORMAL LOW (ref 34.7–43.7)
Hgb: 10.5 g/dL — ABNORMAL LOW (ref 11.4–14.8)
Immature Granulocytes Absolute: 0.02 10*3/uL (ref 0.00–0.07)
Immature Granulocytes: 0.2 %
Instrument Absolute Neutrophil Count: 5.48 10*3/uL (ref 1.10–6.33)
Lymphocytes Absolute Automated: 2.21 10*3/uL (ref 0.42–3.22)
Lymphocytes Automated: 26.4 %
MCH: 25 pg — ABNORMAL LOW (ref 25.1–33.5)
MCHC: 30.5 g/dL — ABNORMAL LOW (ref 31.5–35.8)
MCV: 81.9 fL (ref 78.0–96.0)
MPV: 11.2 fL (ref 8.9–12.5)
Monocytes Absolute Automated: 0.57 10*3/uL (ref 0.21–0.85)
Monocytes: 6.8 %
Neutrophils Absolute: 5.48 10*3/uL (ref 1.10–6.33)
Neutrophils: 65.6 %
Nucleated RBC: 0 /100 WBC (ref 0.0–0.0)
Platelets: 373 10*3/uL — ABNORMAL HIGH (ref 142–346)
RBC: 4.2 10*6/uL (ref 3.90–5.10)
RDW: 15 % (ref 11–15)
WBC: 8.36 10*3/uL (ref 3.10–9.50)

## 2021-11-17 LAB — COMPREHENSIVE METABOLIC PANEL
ALT: 16 U/L (ref 0–55)
AST (SGOT): 30 U/L (ref 5–41)
Albumin/Globulin Ratio: 1.4 (ref 0.9–2.2)
Albumin: 4.5 g/dL (ref 3.5–5.0)
Alkaline Phosphatase: 83 U/L (ref 37–117)
Anion Gap: 10 (ref 5.0–15.0)
BUN: 17 mg/dL (ref 7.0–21.0)
Bilirubin, Total: 0.2 mg/dL (ref 0.2–1.2)
CO2: 26 mEq/L (ref 17–29)
Calcium: 9.8 mg/dL (ref 7.9–10.2)
Chloride: 106 mEq/L (ref 99–111)
Creatinine: 1.1 mg/dL — ABNORMAL HIGH (ref 0.4–1.0)
Globulin: 3.3 g/dL (ref 2.0–3.6)
Glucose: 95 mg/dL (ref 70–100)
Potassium: 4.6 mEq/L (ref 3.5–5.3)
Protein, Total: 7.8 g/dL (ref 6.0–8.3)
Sodium: 142 mEq/L (ref 135–145)

## 2021-11-17 LAB — COVID-19 (SARS-COV-2) & INFLUENZA  A/B, NAA (ROCHE LIAT)
Influenza A: NOT DETECTED
Influenza B: NOT DETECTED
SARS CoV 2 Overall Result: NOT DETECTED

## 2021-11-17 LAB — GFR: EGFR: 58.4

## 2021-11-17 LAB — PT/INR
PT INR: 1 (ref 0.9–1.1)
PT: 11.4 s (ref 10.1–12.9)

## 2021-11-17 LAB — LIPASE: Lipase: 20 U/L (ref 8–78)

## 2021-11-17 LAB — LACTIC ACID, PLASMA: Lactic Acid: 1 mmol/L (ref 0.2–2.0)

## 2021-11-17 MED ORDER — IOHEXOL 350 MG/ML IV SOLN
80.0000 mL | Freq: Once | INTRAVENOUS | Status: AC | PRN
Start: 2021-11-17 — End: 2021-11-17
  Administered 2021-11-17: 80 mL via INTRAVENOUS

## 2021-11-17 MED ORDER — DICLOFENAC SODIUM 1 % EX GEL
2.0000 g | Freq: Four times a day (QID) | CUTANEOUS | 0 refills | Status: DC
Start: 2021-11-17 — End: 2024-02-08

## 2021-11-17 MED ORDER — ONDANSETRON HCL 4 MG/2ML IJ SOLN
4.0000 mg | Freq: Once | INTRAMUSCULAR | Status: AC
Start: 2021-11-17 — End: 2021-11-17
  Administered 2021-11-17: 4 mg via INTRAVENOUS
  Filled 2021-11-17: qty 2

## 2021-11-17 MED ORDER — ACETAMINOPHEN 325 MG PO TABS
650.0000 mg | ORAL_TABLET | Freq: Four times a day (QID) | ORAL | 0 refills | Status: DC | PRN
Start: 2021-11-17 — End: 2022-05-12

## 2021-11-17 MED ORDER — SODIUM CHLORIDE 0.9 % IV BOLUS
1000.0000 mL | Freq: Once | INTRAVENOUS | Status: AC
Start: 2021-11-17 — End: 2021-11-17
  Administered 2021-11-17: 1000 mL via INTRAVENOUS

## 2021-11-17 MED ORDER — OXYCODONE-ACETAMINOPHEN 5-325 MG PO TABS
1.0000 | ORAL_TABLET | Freq: Once | ORAL | Status: AC
Start: 2021-11-17 — End: 2021-11-17
  Administered 2021-11-17: 1 via ORAL
  Filled 2021-11-17: qty 1

## 2021-11-17 MED ORDER — LIDOCAINE 5 % EX PTCH
1.0000 | MEDICATED_PATCH | CUTANEOUS | 0 refills | Status: AC
Start: 2021-11-17 — End: 2021-12-01

## 2021-11-17 NOTE — EDIE (Signed)
COLLECTIVE?NOTIFICATION?11/17/2021 12:04?Emily [name], Emily [name]?MRN: [id number]    [place] [facility] encounter information:   KGM:?[id number]  Account [id number]  Billing Account [id number]      Criteria Met      5 ED Visits in 12 Months    Security and Safety  No Security Events were found.  ED Care Guidelines  There are currently no ED Care Guidelines for this patient. Please check your facility's medical records system.    Flags      Negative COVID-19 Lab Result - [facility] - A specimen collected from this patient was negative for COVID-19 / Attributed By: [place] [facility] / Attributed On: 11/13/2021       Prescription Monitoring Program  000??- Narcotic Use Score  000??- Sedative Use Score  000??- Stimulant Use Score  000??- Overdose Risk Score  - All Scores range from 000-999 with 75% of the population scoring < 200 and on 1% scoring above 650  - The last digit of the narcotic, sedative, and stimulant score indicates the number of active prescriptions of that type  - Higher Use scores correlate with increased prescribers, pharmacies, mg equiv, and overlapping prescriptions  - Higher Overdose Risk Scores correlate with increased risk of unintentional overdose death   Concerning or unexpectedly high scores should prompt a review of the PMP record; this does not constitute checking PMP for prescribing purposes.    E.D. Visit Count (12 mo.)  Facility Visits   [place] - [facility] 9   Total 9   Note: Visits indicate total known visits.     Recent Emergency Department Visit Summary  Date Facility [facility] Type Diagnoses or Chief Complaint    Nov 17, 2021  [place] [facility]  [place]  [place]  Emergency      Pain in right side, breast cancer patient      Nov 11, 2021  [place] [facility]  [place]  [place]  Emergency      chest pain,dizziness,weakness      Syncope      Chest Pain      Paresthesia of skin      Chest pain, unspecified      Acute kidney failure, unspecified       Syncope and collapse      Dizziness and giddiness      Nontoxic single thyroid nodule      Oct 15, 2021  [place] [facility]  [place]  [place]  Emergency      palpitations shortness of breath      Palpitations      Other chest pain      May 22, 2021  [place] [facility]  [place]  [place]  Emergency      Abd Pain/Weakness      Dizziness      Abdominal Pain      Nausea      Diarrhea      Right lower quadrant pain      Vascular disorder of intestine, unspecified      Apr 21, 2021  [place] - [facility]  [place]  [place]  Emergency      headache, body aches, leg tingling      Dizziness      Chest wall pain      Chondrocostal junction syndrome [Tietze]      Mar 08, 2021   - [facility]  [place]  [place]  Emergency  Medic      Altered Mental Status      Altered mental status, unspecified      Mar 04, 2021  [place] - [facility]  [place]  [place]  Emergency      cough/body aches      Generalized Body Aches      Myalgia, unspecified site      Sciatica, left side      Contact with and (suspected) exposure to COVID-19      Urinary tract infection, site not specified      Jan 03, 2021  [place] [facility]  [place]  [place]  Emergency      pain in both legs/cough      Generalized weakness      Dizziness      Chest Pain      Weakness      Paresthesia of skin      Dizziness and giddiness      Chest pain, unspecified      Nov 24, 2020  [place] [facility]  [place]  [place]  Emergency      dizziness, tingling      dizziness, tingling on left side      dizziness, tingling on left side, bodyaches      Dizziness      Polyneuropathy, unspecified      Malignant neoplasm of unspecified site of unspecified female breast        Recent Inpatient Visit Summary  Date Facility [facility] Type Diagnoses or Chief Complaint    May 22, 2021  [place] - [facility]  [place]  [place]  Medical Surgical      Vascular disorder of intestine, unspecified      Right lower quadrant pain      Estrogen receptor positive status [ER+]      Malignant neoplasm of overlapping  sites of right female breast        Care Team  Provider Specialty Phone Fax Service Dates   [place], [facility] , DO Internal Medicine   Current    Rondel Baton, M.D. Internal Medicine   Current    DAVIDSON, Janet Berlin MD Judie Petit, MD Family Medicine: Adult Medicine [phone] Current    Santa Lighter, M.D. Internal Medicine   Current    Cornelius Moras, NP Nurse Practitioner: Adult Health [phone]  Current    Sandria Bales Nurse Practitioner: Gerontology [phone]  Current      Collective Portal  This patient has registered at the [facility] Emergency Department   For more information visit: [email or web address]     PLEASE NOTE:     1.   Any care recommendations and other clinical information are provided as guidelines or for historical purposes only, and providers should exercise their own clinical judgment when providing care.    2.   You may only use this information for purposes of treatment, payment or health care operations activities, and subject to the limitations of applicable Collective Policies.    3.   You should consult directly with the organization that provided a care guideline or other clinical history with any questions about additional information or accuracy or completeness of information provided.    ? 2023 [place], [employer] - [email or web address]

## 2021-11-17 NOTE — ED Notes (Signed)
Pt c/o pain to mastectomy scar on right side. Pt states she had mastectomy 3-4 years ago. Pt requesting u/s to breast and mammogram. Pt informed ED does not do mammograms. Pt requesting to see MD for r breast imaging.

## 2021-11-17 NOTE — ED Provider Notes (Signed)
EMERGENCY DEPARTMENT NOTE     Patient initially seen and examined at   ED PHYSICIAN ASSIGNED       Date/Time Event User Comments    11/17/21 1429 Physician Assigned Loura Pardon, Marisella Puccio, Maelin Kurkowski, MD assigned as Attending             HISTORY OF PRESENT ILLNESS   Independent Historian: Patient    Chief Complaint: Abdominal Pain, Scar Tissue Pain , Nausea, and Dysuria       76 y.o. female presents with right-sided abdominal pain.  Patient states it radiates to her chest towards her surgical scar on the right breast and back.  States her pain started this morning.  States she has some associated nausea as well.  Denies any difficulty breathing, fevers, vomiting or diarrhea.    MEDICAL HISTORY     Past Medical History:  Past Medical History:   Diagnosis Date    Breast cancer 2014    right breast mastectomy    Exercise-induced angina     negative work up    GERD (gastroesophageal reflux disease)     Headache     resolved    Hyperlipidemia     Hypertension     Hypothyroidism     Low back pain     Malignant neoplasm of overlapping sites of right female breast 10/19/2015    Syncope and collapse     Chronic Vertigo    Vertigo     Past Surgical History:  Past Surgical History:   Procedure Laterality Date    COLONOSCOPY  2021    COLONOSCOPY, POLYPECTOMY N/A 07/21/2020    Procedure: COLONOSCOPY, POLYPECTOMY;  Surgeon: Einar Grad, MD;  Location: [facility];  Service: Gastroenterology;  Laterality: N/A;    EGD, BIOPSY N/A 07/21/2020    Procedure: EGD, BIOPSY;  Surgeon: Einar Grad, MD;  Location: [facility];  Service: Gastroenterology;  Laterality: N/A;    EXPLORATORY LAPAROTOMY N/A 05/22/2021    Procedure: EXPLORATORY LAPAROTOMY;  Surgeon: Delena Serve, MD;  Location: [facility];  Service: General;  Laterality: N/A;    HYSTERECTOMY      LAPAROSCOPY, DIAGNOSTIC N/A 05/22/2021    Procedure: LAPAROSCOPY, DIAGNOSTIC;  Surgeon: Delena Serve, MD;  Location: [facility];  Service: General;   Laterality: N/A;    LAPAROTOMY, COLECTOMY, RIGHT Right 05/22/2021    Procedure: LAPAROTOMY, COLECTOMY, RIGHT;  Surgeon: Delena Serve, MD;  Location: [facility];  Service: General;  Laterality: Right;    MASTECTOMY Right 2015      Family History:  Family History   Problem Relation Age of Onset    Breast cancer Neg Hx     Social History:  Social History     Socioeconomic History    Marital status: Widowed   Tobacco Use    Smoking status: Never    Smokeless tobacco: Never   Vaping Use    Vaping Use: Never used   Substance and Sexual Activity    Alcohol use: Not Currently     Comment: occ    Drug use: No    Sexual activity: Not Currently        Outpatient Medication:  Discharge Medication List as of 11/17/2021  6:30 PM        CONTINUE these medications which have NOT CHANGED    Details   amLODIPine (NORVASC) 10 MG tablet Take 1 tablet (10 mg total) by mouth daily, Starting Fri 04/02/2021, E-Rx  anastrozole (ARIMIDEX) 1 MG tablet Take 1 tablet (1 mg total) by mouth every morning, Starting Tue 06/01/2021, Normal      Apoaequorin (PREVAGEN PO) Take by mouth daily, Historical Med      aspirin 81 MG chewable tablet Chew 1 tablet (81 mg total) by mouth daily, Starting Wed 01/29/2020, E-Rx      desonide (DESOWEN) 0.05 % ointment Apply topically 2 (two) times daily, Starting Sun 05/10/2020, E-Rx      ezetimibe (ZETIA) 10 MG tablet Take 1 tablet (10 mg total) by mouth daily, Starting Tue 10/20/2020, E-Rx      Ferrous Sulfate (IRON PO) Take by mouth, Historical Med      gabapentin (NEURONTIN) 100 MG capsule Take 1 capsule (100 mg total) by mouth 2 (two) times daily as needed (Neuropathy), Starting Tue 06/01/2021, Print      guaiFENesin (MUCINEX) 600 MG 12 hr tablet Take 2 tablets (1,200 mg total) by mouth 2 (two) times daily, Starting Sun 08/30/2020, E-Rx      ibuprofen (ADVIL) 600 MG tablet Take 1 tablet (600 mg) by mouth every 6 (six) hours as needed for Pain, Starting Fri 10/15/2021, E-Rx       lactobacillus/streptococcus (RISAQUAD) Cap Take 1 capsule by mouth daily, Starting Tue 06/01/2021, E-Rx      levothyroxine (SYNTHROID) 25 MCG tablet Take 1 tablet (25 mcg total) by mouth every morning, Starting Wed 01/29/2020, E-Rx      lisinopril (ZESTRIL) 40 MG tablet Take 1 tablet (40 mg total) by mouth daily, Starting Fri 04/02/2021, E-Rx      magnesium oxide (MAG-OX) 400 MG tablet Take 1 tablet (400 mg total) by mouth daily, Starting Fri 05/29/2020, E-Rx      meclizine (ANTIVERT) 25 MG tablet Take 1 tablet (25 mg) by mouth 3 (three) times daily as needed for Dizziness, Starting Thu 11/11/2021, E-Rx      mirtazapine (REMERON) 15 MG tablet Take 1 tablet (15 mg total) by mouth nightly, Starting Tue 06/01/2021, E-Rx      Multiple Vitamins-Minerals (MULTIVITAMIN WITH MINERALS) tablet Take 1 tablet by mouth every morning    , Historical Med      pantoprazole (PROTONIX) 40 MG tablet Take 1 tablet (40 mg total) by mouth every morning before breakfast, Starting Wed 01/29/2020, E-Rx      rosuvastatin (CRESTOR) 40 MG tablet Take 0.5 tablets (20 mg total) by mouth daily, Starting Mon 09/28/2020, Print      traMADol (ULTRAM) 50 MG tablet Take 1 tablet (50 mg total) by mouth every 6 (six) hours as needed for Pain, Starting Tue 06/01/2021, Print      vitamin B-1 (THIAMINE) 100 MG tablet Take 100 mg by mouth every morning., Historical Med             PHYSICAL EXAM     Constitutional: Appears stated age  Eyes: Pupils equal, non-icteric  HENT: NC, AT, trachea midline  CV:  Well perfused, normal rate, normal rhythm   Resp: Normal effort, no stridor  GI: Soft, right upper quadrant and right lower quadrant tenderness, not distended  MSK: No acute injuries on extremities  Neuro: A+Ox3, moving extremities spontaneous, normal gait  Psych: Intact judgement and insight  Skin: Warm, dry ED Triage Vitals [11/17/21 1314]   Enc Vitals Group      BP 144/70      Heart Rate 86      Resp Rate 18      Temp 98 F (36.7 C)      Temp  Source Oral      SpO2  97 %      Weight       Height       Head Circumference       Peak Flow       Pain Score 9      Pain Loc       Pain Edu?       Excl. in GC?           MEDICAL DECISION MAKING     PRIMARY PROBLEM LIST     Acute illness/injury with risk to life or bodily function (based on differential diagnosis or evaluation) Moderate abdominal pain  Chronic Illness Impacting Care of the above problem: Advanced age Increases complexity of evaluation  Differential Diagnosis: Appendicitis, colitis, diverticulitis, biliary tree pathology, cholecystitis, pancreatitis, musculoskeletal pain  DISCUSSION           76 year old female presenting with right upper quadrant, right-sided abdominal pain.  Her labs are relatively unremarkable.  CT did show gallstones.  Right upper quadrant ultrasound was ordered which also showed cholelithiasis but no acute cholecystitis.  Patient currently states she feels much better.  She may be having intermittent symptoms for cholelithiasis.  I discussed the findings with the patient.  No acute surgical process at this time.  Discussed follow-up with surgery clinic for elective cholecystectomy.  I also discussed return precautions to the ED such as worsening pain, nausea vomiting or fevers.  She showed good understanding.  Discharged in stable condition.    PULSE OX  CARDIAC STUDIES  EMERGENCY IMAGING STUDIES   The following studies and imaging were independently interpreted by me Chatham Howington, MD.    For full study results please see chart.        CRITICAL CARE/PROCEDURES    Procedures      RADIOLOGY IMAGING STUDIES      US Abdomen Limited RUQ   Final Result    Cholelithiasis. No sonographic stigmata of acute   cholecystitis      Laurena Slimmer, MD    11/17/2021 6:13 PM      CT Abd/Pelvis with IV Contrast   Final Result      1. Cholelithiasis. Further evaluation with ultrasound could be obtained   as it is more sensitive for gallbladder pathology.   2. Bilateral renal cysts.      Wyatt Portela, MD    11/17/2021 4:57  PM                External Records Reviewed?: Physician Office Records and Inpatient Records  Additional Notes                    Vital Signs: Reviewed the patient's vital signs.   Nursing Notes: Reviewed and utilized available nursing notes.  Medical Records Reviewed: Reviewed available past medical records.  Counseling: The emergency provider has spoken with the patient and discussed today's findings, in addition to providing specific details for the plan of care.  Questions are answered and there is agreement with the plan.      MIPS DOCUMENTATION        EMERGENCY DEPT. MEDICATIONS      ED Medication Orders (From admission, onward)      Start Ordered     Status Ordering Provider    11/17/21 1641 11/17/21 1641  iohexol (OMNIPAQUE) 350 MG/ML injection 80 mL  IMG once as needed        Route: Intravenous  Ordered Dose: 80 mL  Last MAR action: Imaging Agent [facility] Lat[facility]    11/17/21 1447 11/17/21 1446  sodium chloride 0.9 % bolus 1,000 mL  Once        Route: Intravenous  Ordered Dose: 1,000 mL     Last MAR action: Stopped Solstice Lastinger    11/17/21 1447 11/17/21 1446  ondansetron (ZOFRAN) injection 4 mg  Once        Route: Intravenous  Ordered Dose: 4 mg     Last MAR action: Given Amya Hlad    11/17/21 1447 11/17/21 1446  oxyCODONE-acetaminophen (PERCOCET) 5-325 MG per tablet 1 tablet  Once        Route: Oral  Ordered Dose: 1 tablet     Last MAR action: Given Shai Rasmussen            LABORATORY RESULTS    Ordered and independently interpreted AVAILABLE laboratory tests.   Results       Procedure Component Value Units Date/Time    GFR [[id number]][[id number]] Collected: 11/17/21 1603     Updated: 11/17/21 1652     EGFR 58.4    Comprehensive metabolic panel [[id number]][[id number]]  (Abnormal) Collected: 11/17/21 1603    Specimen: Blood Updated: 11/17/21 1652     Glucose 95 mg/dL      BUN 95.617.0 mg/dL      Creatinine 1.1 mg/dL      Sodium 387142 mEq/L      Potassium 4.6 mEq/L      Chloride 106 mEq/L       CO2 26 mEq/L      Calcium 9.8 mg/dL      Protein, Total 7.8 g/dL      Albumin 4.5 g/dL      AST (SGOT) 30 U/L      ALT 16 U/L      Alkaline Phosphatase 83 U/L      Bilirubin, Total 0.2 mg/dL      Globulin 3.3 g/dL      Albumin/Globulin Ratio 1.4     Anion Gap 10.0    Lipase [[id number]][[id number]] Collected: 11/17/21 1603    Specimen: Blood Updated: 11/17/21 1652     Lipase 20 U/L     Prothrombin time/INR [[id number]][[id number]] Collected: 11/17/21 1603    Specimen: Blood Updated: 11/17/21 1630     PT 11.4 sec      PT INR 1.0    CBC and differential [[id number]][[id number]]  (Abnormal) Collected: 11/17/21 1603    Specimen: Blood Updated: 11/17/21 1628     WBC 8.36 x10 3/uL      Hgb 10.5 g/dL      Hematocrit 35.534.4 %      Platelets 373 x10 3/uL      RBC 4.20 x10 6/uL      MCV 81.9 fL      MCH 25.0 pg      MCHC 30.5 g/dL      RDW 15 %      MPV 11.2 fL      Instrument Absolute Neutrophil Count 5.48 x10 3/uL      Neutrophils 65.6 %      Lymphocytes Automated 26.4 %      Monocytes 6.8 %      Eosinophils Automated 0.6 %      Basophils Automated 0.4 %      Immature Granulocytes 0.2 %      Nucleated RBC 0.0 /100 WBC      Neutrophils Absolute 5.48 x10 3/uL      Lymphocytes Absolute Automated 2.21 x10 3/uL  Monocytes Absolute Automated 0.57 x10 3/uL      Eosinophils Absolute Automated 0.05 x10 3/uL      Basophils Absolute Automated 0.03 x10 3/uL      Immature Granulocytes Absolute 0.02 x10 3/uL      Absolute NRBC 0.00 x10 3/uL     COVID-19 (SARS-CoV-2) and Influenza A/B, NAA ([facility])- Admission [[id number]] Collected: 11/17/21 1542    Specimen: Culturette from Nasopharyngeal Updated: 11/17/21 1620     Purpose of COVID testing Diagnostic -[facility]     SARS-CoV-2 Specimen Source Nasal Swab     SARS CoV 2 Overall Result Not Detected     Influenza A Not Detected     Influenza B Not Detected    Narrative:      o Collect and clearly label specimen type:  o PREFERRED-Upper respiratory specimen: One Nasal Swab in  Transport Media.  o Hand deliver to laboratory  ASAP  Diagnostic -[facility]    Lactic Acid [[id number]] Collected: 11/17/21 1603    Specimen: Blood Updated: 11/17/21 1617     Lactic Acid 1.0 mmol/L     Urinalysis Reflex to Microscopic Exam- Reflex to Culture [[id number]]  (Abnormal) Collected: 11/17/21 1542     Updated: 11/17/21 1608     Urine Type Urine, Clean Ca     Color, UA Straw     Clarity, UA Clear     Specific Gravity UA 1.006     Urine pH 7.0     Leukocyte Esterase, UA Trace     Nitrite, UA Negative     Protein, UR Negative     Glucose, UA Negative     Ketones UA Negative     Urobilinogen, UA Negative mg/dL      Bilirubin, UA Negative     Blood, UA Negative     RBC, UA 0 - 2 /hpf      WBC, UA 0 - 5 /hpf             DIAGNOSIS      Diagnosis:  Final diagnoses:   Calculus of gallbladder without cholecystitis without obstruction   Right sided abdominal pain       Disposition:  ED Disposition       ED Disposition   Discharge    Condition   --    Date/Time   Wed Nov 17, 2021  6:29 PM    Comment   [facility]row[facility] Headen discharge to home/self care.    Condition at disposition: Stable                 Prescriptions:  Discharge Medication List as of 11/17/2021  6:30 PM        START taking these medications    Details   acetaminophen (TYLENOL) 325 MG tablet Take 2 tablets (650 mg) by mouth every 6 (six) hours as needed for Pain, Starting Wed 11/17/2021, E-Rx           CONTINUE these medications which have CHANGED    Details   diclofenac Sodium (VOLTAREN) 1 % Gel topical gel Apply 2 g topically 4 (four) times daily, Starting Wed 11/17/2021, E-Rx      lidocaine (LIDODERM) 5 % Place 1 patch onto the skin every 24 hours for 14 days Remove & Discard patch within 12 hours or as directed by MD, Starting Wed 11/17/2021, Until Wed 12/01/2021, E-Rx           CONTINUE these medications which have NOT CHANGED    Details  amLODIPine (NORVASC) 10 MG tablet Take 1 tablet (10 mg total) by mouth daily, Starting Fri 04/02/2021, E-Rx      anastrozole (ARIMIDEX) 1 MG tablet Take 1 tablet (1 mg total)  by mouth every morning, Starting Tue 06/01/2021, Normal      Apoaequorin (PREVAGEN PO) Take by mouth daily, Historical Med      aspirin 81 MG chewable tablet Chew 1 tablet (81 mg total) by mouth daily, Starting Wed 01/29/2020, E-Rx      desonide (DESOWEN) 0.05 % ointment Apply topically 2 (two) times daily, Starting Sun 05/10/2020, E-Rx      ezetimibe (ZETIA) 10 MG tablet Take 1 tablet (10 mg total) by mouth daily, Starting Tue 10/20/2020, E-Rx      Ferrous Sulfate (IRON PO) Take by mouth, Historical Med      gabapentin (NEURONTIN) 100 MG capsule Take 1 capsule (100 mg total) by mouth 2 (two) times daily as needed (Neuropathy), Starting Tue 06/01/2021, Print      guaiFENesin (MUCINEX) 600 MG 12 hr tablet Take 2 tablets (1,200 mg total) by mouth 2 (two) times daily, Starting Sun 08/30/2020, E-Rx      ibuprofen (ADVIL) 600 MG tablet Take 1 tablet (600 mg) by mouth every 6 (six) hours as needed for Pain, Starting Fri 10/15/2021, E-Rx      lactobacillus/streptococcus (RISAQUAD) Cap Take 1 capsule by mouth daily, Starting Tue 06/01/2021, E-Rx      levothyroxine (SYNTHROID) 25 MCG tablet Take 1 tablet (25 mcg total) by mouth every morning, Starting Wed 01/29/2020, E-Rx      lisinopril (ZESTRIL) 40 MG tablet Take 1 tablet (40 mg total) by mouth daily, Starting Fri 04/02/2021, E-Rx      magnesium oxide (MAG-OX) 400 MG tablet Take 1 tablet (400 mg total) by mouth daily, Starting Fri 05/29/2020, E-Rx      meclizine (ANTIVERT) 25 MG tablet Take 1 tablet (25 mg) by mouth 3 (three) times daily as needed for Dizziness, Starting Thu 11/11/2021, E-Rx      mirtazapine (REMERON) 15 MG tablet Take 1 tablet (15 mg total) by mouth nightly, Starting Tue 06/01/2021, E-Rx      Multiple Vitamins-Minerals (MULTIVITAMIN WITH MINERALS) tablet Take 1 tablet by mouth every morning    , Historical Med      pantoprazole (PROTONIX) 40 MG tablet Take 1 tablet (40 mg total) by mouth every morning before breakfast, Starting Wed 01/29/2020, E-Rx      rosuvastatin  (CRESTOR) 40 MG tablet Take 0.5 tablets (20 mg total) by mouth daily, Starting Mon 09/28/2020, Print      traMADol (ULTRAM) 50 MG tablet Take 1 tablet (50 mg total) by mouth every 6 (six) hours as needed for Pain, Starting Tue 06/01/2021, Print      vitamin B-1 (THIAMINE) 100 MG tablet Take 100 mg by mouth every morning., Historical Med                 This note was generated by the [employer] EMR system/ Dragon speech recognition and may contain inherent errors or omissions not intended by the user. Grammatical errors, random word insertions, deletions and pronoun errors  are occasional consequences of this technology due to software limitations. Not all errors are caught or corrected. If there are questions or concerns about the content of this note or information contained within the body of this dictation they should be addressed directly with the author for clarification.       Raijon Lindfors, MD  11/17/21 2317

## 2021-11-18 ENCOUNTER — Ambulatory Visit (INDEPENDENT_AMBULATORY_CARE_PROVIDER_SITE_OTHER): Payer: Medicare Other | Admitting: Registered Nurse

## 2021-11-22 ENCOUNTER — Other Ambulatory Visit: Payer: Self-pay | Admitting: Internal Medicine

## 2021-11-22 ENCOUNTER — Ambulatory Visit
Admission: RE | Admit: 2021-11-22 | Discharge: 2021-11-22 | Disposition: A | Discharge status: Home / Self Care | Payer: Medicare Other | Source: Ambulatory Visit | Attending: Internal Medicine | Admitting: Internal Medicine

## 2021-11-22 DIAGNOSIS — G44001 Cluster headache syndrome, unspecified, intractable: Secondary | ICD-10-CM

## 2021-11-30 ENCOUNTER — Emergency Department: Payer: Medicare Other

## 2021-11-30 ENCOUNTER — Emergency Department
Admission: EM | Admit: 2021-11-30 | Discharge: 2021-11-30 | Disposition: A | Discharge status: Home / Self Care | Payer: Medicare Other | Attending: Emergency Medicine | Admitting: Emergency Medicine

## 2021-11-30 DIAGNOSIS — Z20822 Contact with and (suspected) exposure to covid-19: Secondary | ICD-10-CM | POA: Insufficient documentation

## 2021-11-30 DIAGNOSIS — R42 Dizziness and giddiness: Secondary | ICD-10-CM | POA: Insufficient documentation

## 2021-11-30 DIAGNOSIS — R10811 Right upper quadrant abdominal tenderness: Secondary | ICD-10-CM | POA: Insufficient documentation

## 2021-11-30 DIAGNOSIS — J8410 Pulmonary fibrosis, unspecified: Secondary | ICD-10-CM | POA: Insufficient documentation

## 2021-11-30 DIAGNOSIS — I119 Hypertensive heart disease without heart failure: Secondary | ICD-10-CM | POA: Insufficient documentation

## 2021-11-30 LAB — HEPATIC FUNCTION PANEL
ALT: 14 U/L (ref 0–55)
AST (SGOT): 22 U/L (ref 5–41)
Albumin/Globulin Ratio: 1.4 (ref 0.9–2.2)
Albumin: 4.2 g/dL (ref 3.5–5.0)
Alkaline Phosphatase: 84 U/L (ref 37–117)
Bilirubin Direct: 0.1 mg/dL (ref 0.0–0.5)
Bilirubin Indirect: 0.2 mg/dL (ref 0.2–1.0)
Bilirubin, Total: 0.3 mg/dL (ref 0.2–1.2)
Globulin: 3.1 g/dL (ref 2.0–3.6)
Protein, Total: 7.3 g/dL (ref 6.0–8.3)

## 2021-11-30 LAB — CBC AND DIFFERENTIAL
Absolute NRBC: 0 10*3/uL (ref 0.00–0.00)
Basophils Absolute Automated: 0.03 10*3/uL (ref 0.00–0.08)
Basophils Automated: 0.5 %
Eosinophils Absolute Automated: 0.04 10*3/uL (ref 0.00–0.44)
Eosinophils Automated: 0.6 %
Hematocrit: 34.5 % — ABNORMAL LOW (ref 34.7–43.7)
Hgb: 10.4 g/dL — ABNORMAL LOW (ref 11.4–14.8)
Immature Granulocytes Absolute: 0.02 10*3/uL (ref 0.00–0.07)
Immature Granulocytes: 0.3 %
Instrument Absolute Neutrophil Count: 4.19 10*3/uL (ref 1.10–6.33)
Lymphocytes Absolute Automated: 1.6 10*3/uL (ref 0.42–3.22)
Lymphocytes Automated: 25.3 %
MCH: 25 pg — ABNORMAL LOW (ref 25.1–33.5)
MCHC: 30.1 g/dL — ABNORMAL LOW (ref 31.5–35.8)
MCV: 82.9 fL (ref 78.0–96.0)
MPV: 11.3 fL (ref 8.9–12.5)
Monocytes Absolute Automated: 0.45 10*3/uL (ref 0.21–0.85)
Monocytes: 7.1 %
Neutrophils Absolute: 4.19 10*3/uL (ref 1.10–6.33)
Neutrophils: 66.2 %
Nucleated RBC: 0 /100 WBC (ref 0.0–0.0)
Platelets: 364 10*3/uL — ABNORMAL HIGH (ref 142–346)
RBC: 4.16 10*6/uL (ref 3.90–5.10)
RDW: 15 % (ref 11–15)
WBC: 6.33 10*3/uL (ref 3.10–9.50)

## 2021-11-30 LAB — URINALYSIS REFLEX TO MICROSCOPIC EXAM - REFLEX TO CULTURE
Bilirubin, UA: NEGATIVE
Blood, UA: NEGATIVE
Glucose, UA: NEGATIVE
Ketones UA: NEGATIVE
Leukocyte Esterase, UA: NEGATIVE
Nitrite, UA: NEGATIVE
Protein, UR: NEGATIVE
Specific Gravity UA: 1.005 (ref 1.001–1.035)
Urine pH: 6 (ref 5.0–8.0)
Urobilinogen, UA: NEGATIVE mg/dL (ref 0.2–2.0)

## 2021-11-30 LAB — GFR: EGFR: 58.4

## 2021-11-30 LAB — BASIC METABOLIC PANEL
Anion Gap: 8 (ref 5.0–15.0)
BUN: 18 mg/dL (ref 7.0–21.0)
CO2: 29 mEq/L (ref 17–29)
Calcium: 9.8 mg/dL (ref 7.9–10.2)
Chloride: 104 mEq/L (ref 99–111)
Creatinine: 1.1 mg/dL — ABNORMAL HIGH (ref 0.4–1.0)
Glucose: 123 mg/dL — ABNORMAL HIGH (ref 70–100)
Potassium: 4.2 mEq/L (ref 3.5–5.3)
Sodium: 141 mEq/L (ref 135–145)

## 2021-11-30 LAB — LIPASE: Lipase: 21 U/L (ref 8–78)

## 2021-11-30 LAB — COVID-19 (SARS-COV-2) & INFLUENZA  A/B, NAA (ROCHE LIAT)
Influenza A: NOT DETECTED
Influenza B: NOT DETECTED
SARS CoV 2 Overall Result: NOT DETECTED

## 2021-11-30 LAB — MAGNESIUM: Magnesium: 2.2 mg/dL (ref 1.6–2.6)

## 2021-11-30 MED ORDER — MECLIZINE HCL 25 MG PO TABS
25.0000 mg | ORAL_TABLET | Freq: Three times a day (TID) | ORAL | 0 refills | Status: AC | PRN
Start: 2021-11-30 — End: 2021-12-10

## 2021-11-30 MED ORDER — SODIUM CHLORIDE 0.9 % IV BOLUS
1000.0000 mL | Freq: Once | INTRAVENOUS | Status: AC
Start: 2021-11-30 — End: 2021-11-30
  Administered 2021-11-30: 1000 mL via INTRAVENOUS

## 2021-11-30 MED ORDER — MECLIZINE HCL 12.5 MG PO TABS
25.0000 mg | ORAL_TABLET | Freq: Once | ORAL | Status: AC
Start: 2021-11-30 — End: 2021-11-30
  Administered 2021-11-30: 25 mg via ORAL
  Filled 2021-11-30: qty 2

## 2021-11-30 MED ORDER — ONDANSETRON 4 MG PO TBDP
4.0000 mg | ORAL_TABLET | Freq: Four times a day (QID) | ORAL | 0 refills | Status: DC | PRN
Start: 2021-11-30 — End: 2022-05-12

## 2021-11-30 MED ORDER — ONDANSETRON HCL 4 MG/2ML IJ SOLN
4.0000 mg | Freq: Once | INTRAMUSCULAR | Status: AC
Start: 2021-11-30 — End: 2021-11-30
  Administered 2021-11-30: 4 mg via INTRAVENOUS
  Filled 2021-11-30: qty 2

## 2021-11-30 NOTE — ED Provider Notes (Signed)
EMERGENCY DEPARTMENT NOTE     Patient initially seen and examined at   ED PHYSICIAN ASSIGNED       Date/Time Event User Comments    11/30/21 1359 Physician Assigned Azalia Bilis Gracy Racer, MD assigned as Attending           ED MIDLEVEL (APP) ASSIGNED       None            HISTORY OF PRESENT ILLNESS       Chief Complaint: Fatigue       76 y.o. female with past medical history as below presents to the ED with multiple complaints.  Patient reports R flank pain and dysuria since having her mastectomy.  She also started having dizziness and nausea last night.  Now with myalgias.  No fevers.      Independent Historian (other than patient): No  Additional History Provided by Independent Historian:  MEDICAL HISTORY     Past Medical History:  Past Medical History:   Diagnosis Date    Breast cancer 2014    right breast mastectomy    Exercise-induced angina     negative work up    GERD (gastroesophageal reflux disease)     Headache     resolved    Hyperlipidemia     Hypertension     Hypothyroidism     Low back pain     Malignant neoplasm of overlapping sites of right female breast 10/19/2015    Syncope and collapse     Chronic Vertigo    Vertigo        Past Surgical History:  Past Surgical History:   Procedure Laterality Date    COLONOSCOPY  2021    COLONOSCOPY, POLYPECTOMY N/A 07/21/2020    Procedure: COLONOSCOPY, POLYPECTOMY;  Surgeon: Einar Grad, MD;  Location: [facility];  Service: Gastroenterology;  Laterality: N/A;    EGD, BIOPSY N/A 07/21/2020    Procedure: EGD, BIOPSY;  Surgeon: Einar Grad, MD;  Location: [facility];  Service: Gastroenterology;  Laterality: N/A;    EXPLORATORY LAPAROTOMY N/A 05/22/2021    Procedure: EXPLORATORY LAPAROTOMY;  Surgeon: Delena Serve, MD;  Location: [facility];  Service: General;  Laterality: N/A;    HYSTERECTOMY      LAPAROSCOPY, DIAGNOSTIC N/A 05/22/2021    Procedure: LAPAROSCOPY, DIAGNOSTIC;  Surgeon: Delena Serve, MD;  [facility]: [facility];  Service: General;  Laterality: N/A;    LAPAROTOMY, COLECTOMY, RIGHT Right 05/22/2021    Procedure: LAPAROTOMY, COLECTOMY, RIGHT;  Surgeon: Delena Serve, MD;  Location: [facility];  Service: General;  Laterality: Right;    MASTECTOMY Right 2015       Social History:  Social History     Socioeconomic History    Marital status: Widowed   Tobacco Use    Smoking status: Never    Smokeless tobacco: Never   Vaping Use    Vaping Use: Never used   Substance and Sexual Activity    Alcohol use: Not Currently     Comment: occ    Drug use: No    Sexual activity: Not Currently       Family History:  Family History   Problem Relation Age of Onset    Breast cancer Neg Hx        Outpatient Medication:  Discharge Medication List as of 11/30/2021  3:27 PM        CONTINUE these medications which have NOT CHANGED  Details   acetaminophen (TYLENOL) 325 MG tablet Take 2 tablets (650 mg) by mouth every 6 (six) hours as needed for Pain, Starting Wed 11/17/2021, E-Rx      amLODIPine (NORVASC) 10 MG tablet Take 1 tablet (10 mg total) by mouth daily, Starting Fri 04/02/2021, E-Rx      anastrozole (ARIMIDEX) 1 MG tablet Take 1 tablet (1 mg total) by mouth every morning, Starting Tue 06/01/2021, Normal      Apoaequorin (PREVAGEN PO) Take by mouth daily, Historical Med      aspirin 81 MG chewable tablet Chew 1 tablet (81 mg total) by mouth daily, Starting Wed 01/29/2020, E-Rx      desonide (DESOWEN) 0.05 % ointment Apply topically 2 (two) times daily, Starting Sun 05/10/2020, E-Rx      diclofenac Sodium (VOLTAREN) 1 % Gel topical gel Apply 2 g topically 4 (four) times daily, Starting Wed 11/17/2021, E-Rx      ezetimibe (ZETIA) 10 MG tablet Take 1 tablet (10 mg total) by mouth daily, Starting Tue 10/20/2020, E-Rx      Ferrous Sulfate (IRON PO) Take by mouth, Historical Med      gabapentin (NEURONTIN) 100 MG capsule Take 1 capsule (100 mg total) by mouth 2 (two) times daily as needed (Neuropathy), Starting Tue 06/01/2021, Print       guaiFENesin (MUCINEX) 600 MG 12 hr tablet Take 2 tablets (1,200 mg total) by mouth 2 (two) times daily, Starting Sun 08/30/2020, E-Rx      ibuprofen (ADVIL) 600 MG tablet Take 1 tablet (600 mg) by mouth every 6 (six) hours as needed for Pain, Starting Fri 10/15/2021, E-Rx      lactobacillus/streptococcus (RISAQUAD) Cap Take 1 capsule by mouth daily, Starting Tue 06/01/2021, E-Rx      levothyroxine (SYNTHROID) 25 MCG tablet Take 1 tablet (25 mcg total) by mouth every morning, Starting Wed 01/29/2020, E-Rx      lidocaine (LIDODERM) 5 % Place 1 patch onto the skin every 24 hours for 14 days Remove & Discard patch within 12 hours or as directed by MD, Starting Wed 11/17/2021, Until Wed 12/01/2021, E-Rx      lisinopril (ZESTRIL) 40 MG tablet Take 1 tablet (40 mg total) by mouth daily, Starting Fri 04/02/2021, E-Rx      magnesium oxide (MAG-OX) 400 MG tablet Take 1 tablet (400 mg total) by mouth daily, Starting Fri 05/29/2020, E-Rx      !! meclizine (ANTIVERT) 25 MG tablet Take 1 tablet (25 mg) by mouth 3 (three) times daily as needed for Dizziness, Starting Thu 11/11/2021, E-Rx      mirtazapine (REMERON) 15 MG tablet Take 1 tablet (15 mg total) by mouth nightly, Starting Tue 06/01/2021, E-Rx      Multiple Vitamins-Minerals (MULTIVITAMIN WITH MINERALS) tablet Take 1 tablet by mouth every morning    , Historical Med      pantoprazole (PROTONIX) 40 MG tablet Take 1 tablet (40 mg total) by mouth every morning before breakfast, Starting Wed 01/29/2020, E-Rx      rosuvastatin (CRESTOR) 40 MG tablet Take 0.5 tablets (20 mg total) by mouth daily, Starting Mon 09/28/2020, Print      traMADol (ULTRAM) 50 MG tablet Take 1 tablet (50 mg total) by mouth every 6 (six) hours as needed for Pain, Starting Tue 06/01/2021, Print      vitamin B-1 (THIAMINE) 100 MG tablet Take 100 mg by mouth every morning., Historical Med       !! - Potential duplicate medications found. Please discuss with  provider.            REVIEW OF SYSTEMS   Review of Systems See  History of Present Illness  PHYSICAL EXAM     ED Triage Vitals [11/30/21 1338]   Enc Vitals Group      BP 106/64      Heart Rate 83      Resp Rate 19      Temp 97.9 F (36.6 C)      Temp src       SpO2 100 %      Weight       Height       Head Circumference       Peak Flow       Pain Score 0      Pain Loc       Pain Edu?       Excl. in GC?      Physical Exam  Vitals and nursing note reviewed.   Constitutional:       General: She is not in acute distress.     Appearance: Normal appearance.   HENT:      Head: Normocephalic and atraumatic.      Mouth/Throat:      Mouth: Mucous membranes are moist.      Pharynx: Oropharynx is clear.   Eyes:      Conjunctiva/sclera: Conjunctivae normal.      Pupils: Pupils are equal, round, and reactive to light.   Cardiovascular:      Rate and Rhythm: Normal rate and regular rhythm.      Heart sounds: Normal heart sounds.   Pulmonary:      Effort: Pulmonary effort is normal.   Abdominal:      Palpations: Abdomen is soft.      Tenderness: There is abdominal tenderness in the right upper quadrant. There is no guarding or rebound.   Musculoskeletal:      Cervical back: Normal range of motion and neck supple.   Skin:     General: Skin is warm and dry.      Capillary Refill: Capillary refill takes less than 2 seconds.      Findings: No bruising or rash.   Neurological:      General: No focal deficit present.      Mental Status: She is alert and oriented to person, place, and time.      Cranial Nerves: No cranial nerve deficit.      Sensory: No sensory deficit.      Motor: No weakness.      Gait: Gait normal.      MEDICAL DECISION MAKING     PRIMARY PROBLEM LIST      Acute illness/injury with risk to life or bodily function (based on differential diagnosis or evaluation) DIAGNOSIS:flank pain  Chronic Illness Impacting Care of the above problem: Advanced age Increases complexity of evaluation and Increases the risk of severe disease  Differential Diagnosis: Abdominal Pain: bowel obstruction,  abscess, colitis, diverticulitis, appendicitis, peritonitis, bowel perforation, mesenteric ischemia, abdominal aortic aneurysm, cholecystitis, biliay colic, cholangitis   DISCUSSION      Patient with RUQ/R flank pain.  No evidence of pyelo.  No chole on CT.  Likely due to calcification in lungs.  Will refer to pulmonology for further evaluation and treatment.  Nausea and dizziness improved with zofran and meclizine.  Likely chronic vertigo - doubt CVA.  No anemia, hyponatremia, AKI.  Given follow-up and return instructions.    If patient is  being hospitalized is severe sepsis or septic shock suspected?: N/A          External Records Reviewed?: N/A    Additional Notes                     Vital Signs: Reviewed the patient's vital signs.   Nursing Notes: Reviewed and utilized available nursing notes.  Medical Records Reviewed: Reviewed available past medical records.  Counseling: The emergency provider has spoken with the patient and discussed today's findings, in addition to providing specific details for the plan of care.  Questions are answered and there is agreement with the plan.      MIPS DOCUMENTATION              CARDIAC STUDIES    The following cardiac studies were independently interpreted by me the Emergency Medicine Provider.  For full cardiac study results please see chart.    Monitor Strip interpreted by me (ED provider)  Rate: 60-100  Rhythm: Normal Sinus Rhythm  ST segments: No acute changes                              EMERGENCY IMAGING STUDIES    The following imagine studies were independently interpreted by me (emergency medicine provider):                       RADIOLOGY IMAGING STUDIES      CT Abd/Pelvis without Contrast   Final Result        1. No urinary tract stone. No evidence for obstructive uropathy.   2. Nonemergent findings detailed above.      Mitali Bapna, MD    11/30/2021 3:14 PM          EMERGENCY DEPT. MEDICATIONS      ED Medication Orders (From admission, onward)      Start Ordered      Status Ordering Provider    11/30/21 1416 11/30/21 1415  meclizine (ANTIVERT) tablet 25 mg  Once        Route: Oral  Ordered Dose: 25 mg     Last MAR action: Given Kimble Hitchens K    11/30/21 1409 11/30/21 1408  ondansetron (ZOFRAN) injection 4 mg  Once        Route: Intravenous  Ordered Dose: 4 mg     Last MAR action: Given Merrik Puebla K    11/30/21 1409 11/30/21 1408  sodium chloride 0.9 % bolus 1,000 mL  Once        Route: Intravenous  Ordered Dose: 1,000 mL     Last MAR action: Stopped Aava Deland K            LABORATORY RESULTS    Ordered and independently interpreted AVAILABLE laboratory tests.   Results       Procedure Component Value Units Date/Time    COVID-19 (SARS-CoV-2) and Influenza A/B, NAA ([facility])- Age 76 and above [[id number]] Collected: 11/30/21 1438    Specimen: Culturette from Nasopharyngeal Updated: 11/30/21 1516     Purpose of COVID testing Diagnostic -[facility]     SARS-CoV-2 Specimen Source Nasal Swab     SARS CoV 2 Overall Result Not Detected     Influenza A Not Detected     Influenza B Not Detected    Narrative:      o Collect and clearly label specimen type:  o PREFERRED-Upper respiratory specimen: One Nasal Swab  in  [employer].  o Hand deliver to laboratory ASAP  Diagnostic -[facility]    Basic Metabolic Panel [[id number]]  (Abnormal) Collected: 11/30/21 1438    Specimen: Blood Updated: 11/30/21 1515     Glucose 123 mg/dL      BUN 40.9 mg/dL      Creatinine 1.1 mg/dL      Calcium 9.8 mg/dL      Sodium 811 mEq/L      Potassium 4.2 mEq/L      Chloride 104 mEq/L      CO2 29 mEq/L      Anion Gap 8.0    Hepatic function panel (LFT) [[id number]] Collected: 11/30/21 1438    Specimen: Blood Updated: 11/30/21 1515     Bilirubin, Total 0.3 mg/dL      Bilirubin Direct 0.1 mg/dL      Bilirubin Indirect 0.2 mg/dL      AST (SGOT) 22 U/L      ALT 14 U/L      Alkaline Phosphatase 84 U/L      Protein, Total 7.3 g/dL      Albumin 4.2 g/dL      Globulin 3.1 g/dL      Albumin/Globulin Ratio 1.4     Lipase [[id number]] Collected: 11/30/21 1438    Specimen: Blood Updated: 11/30/21 1515     Lipase 21 U/L     GFR [[id number]] Collected: 11/30/21 1438     Updated: 11/30/21 1515     EGFR 58.4    Magnesium [[id number]] Collected: 11/30/21 1438     Updated: 11/30/21 1515     Magnesium 2.2 mg/dL     CBC and differential [[id number]]  (Abnormal) Collected: 11/30/21 1438    Specimen: Blood Updated: 11/30/21 1456     WBC 6.33 x10 3/uL      Hgb 10.4 g/dL      Hematocrit 03.4 %      Platelets 364 x10 3/uL      RBC 4.16 x10 6/uL      MCV 82.9 fL      MCH 25.0 pg      MCHC 30.1 g/dL      RDW 15 %      MPV 11.3 fL      Instrument Absolute Neutrophil Count 4.19 x10 3/uL      Neutrophils 66.2 %      Lymphocytes Automated 25.3 %      Monocytes 7.1 %      Eosinophils Automated 0.6 %      Basophils Automated 0.5 %      Immature Granulocytes 0.3 %      Nucleated RBC 0.0 /100 WBC      Neutrophils Absolute 4.19 x10 3/uL      Lymphocytes Absolute Automated 1.60 x10 3/uL      Monocytes Absolute Automated 0.45 x10 3/uL      Eosinophils Absolute Automated 0.04 x10 3/uL      Basophils Absolute Automated 0.03 x10 3/uL      Immature Granulocytes Absolute 0.02 x10 3/uL      Absolute NRBC 0.00 x10 3/uL     Urinalysis Reflex to Microscopic Exam- Reflex to Culture [[id number]] Collected: 11/30/21 1353     Updated: 11/30/21 1434     Urine Type Urine, Clean Ca     Color, UA Yellow     Clarity, UA Clear     Specific Gravity UA 1.005     Urine pH 6.0     Leukocyte Esterase, UA Negative  Nitrite, UA Negative     Protein, UR Negative     Glucose, UA Negative     Ketones UA Negative     Urobilinogen, UA Negative mg/dL      Bilirubin, UA Negative     Blood, UA Negative     Trans Epithel, UA 0 - 2 /hpf               CRITICAL CARE/PROCEDURES    Procedures    DIAGNOSIS      Diagnosis:  Final diagnoses:   Vertigo   Calcified granuloma of lung       Disposition:  ED Disposition       ED Disposition   Discharge    Condition   --    Date/Time   Tue Nov 30, 2021   3:27 PM    Comment   Emily [name] discharge to home/self care.    Condition at disposition: Stable                 Prescriptions:  Discharge Medication List as of 11/30/2021  3:27 PM        START taking these medications    Details   !! meclizine (ANTIVERT) 25 MG tablet Take 1 tablet (25 mg) by mouth 3 (three) times daily as needed for Dizziness, Starting Tue 11/30/2021, Until Fri 12/10/2021 at 2359, E-Rx      ondansetron (ZOFRAN-ODT) 4 MG disintegrating tablet Take 1 tablet (4 mg) by mouth every 6 (six) hours as needed for Nausea, Starting Tue 11/30/2021, E-Rx       !! - Potential duplicate medications found. Please discuss with provider.        CONTINUE these medications which have NOT CHANGED    Details   acetaminophen (TYLENOL) 325 MG tablet Take 2 tablets (650 mg) by mouth every 6 (six) hours as needed for Pain, Starting Wed 11/17/2021, E-Rx      amLODIPine (NORVASC) 10 MG tablet Take 1 tablet (10 mg total) by mouth daily, Starting Fri 04/02/2021, E-Rx      anastrozole (ARIMIDEX) 1 MG tablet Take 1 tablet (1 mg total) by mouth every morning, Starting Tue 06/01/2021, Normal      Apoaequorin (PREVAGEN PO) Take by mouth daily, Historical Med      aspirin 81 MG chewable tablet Chew 1 tablet (81 mg total) by mouth daily, Starting Wed 01/29/2020, E-Rx      desonide (DESOWEN) 0.05 % ointment Apply topically 2 (two) times daily, Starting Sun 05/10/2020, E-Rx      diclofenac Sodium (VOLTAREN) 1 % Gel topical gel Apply 2 g topically 4 (four) times daily, Starting Wed 11/17/2021, E-Rx      ezetimibe (ZETIA) 10 MG tablet Take 1 tablet (10 mg total) by mouth daily, Starting Tue 10/20/2020, E-Rx      Ferrous Sulfate (IRON PO) Take by mouth, Historical Med      gabapentin (NEURONTIN) 100 MG capsule Take 1 capsule (100 mg total) by mouth 2 (two) times daily as needed (Neuropathy), Starting Tue 06/01/2021, Print      guaiFENesin (MUCINEX) 600 MG 12 hr tablet Take 2 tablets (1,200 mg total) by mouth 2 (two) times daily, Starting Sun  08/30/2020, E-Rx      ibuprofen (ADVIL) 600 MG tablet Take 1 tablet (600 mg) by mouth every 6 (six) hours as needed for Pain, Starting Fri 10/15/2021, E-Rx      lactobacillus/streptococcus (RISAQUAD) Cap Take 1 capsule by mouth daily, Starting Tue 06/01/2021, E-Rx  levothyroxine (SYNTHROID) 25 MCG tablet Take 1 tablet (25 mcg total) by mouth every morning, Starting Wed 01/29/2020, E-Rx      lidocaine (LIDODERM) 5 % Place 1 patch onto the skin every 24 hours for 14 days Remove & Discard patch within 12 hours or as directed by MD, Starting Wed 11/17/2021, Until Wed 12/01/2021, E-Rx      lisinopril (ZESTRIL) 40 MG tablet Take 1 tablet (40 mg total) by mouth daily, Starting Fri 04/02/2021, E-Rx      magnesium oxide (MAG-OX) 400 MG tablet Take 1 tablet (400 mg total) by mouth daily, Starting Fri 05/29/2020, E-Rx      !! meclizine (ANTIVERT) 25 MG tablet Take 1 tablet (25 mg) by mouth 3 (three) times daily as needed for Dizziness, Starting Thu 11/11/2021, E-Rx      mirtazapine (REMERON) 15 MG tablet Take 1 tablet (15 mg total) by mouth nightly, Starting Tue 06/01/2021, E-Rx      Multiple Vitamins-Minerals (MULTIVITAMIN WITH MINERALS) tablet Take 1 tablet by mouth every morning    , Historical Med      pantoprazole (PROTONIX) 40 MG tablet Take 1 tablet (40 mg total) by mouth every morning before breakfast, Starting Wed 01/29/2020, E-Rx      rosuvastatin (CRESTOR) 40 MG tablet Take 0.5 tablets (20 mg total) by mouth daily, Starting Mon 09/28/2020, Print      traMADol (ULTRAM) 50 MG tablet Take 1 tablet (50 mg total) by mouth every 6 (six) hours as needed for Pain, Starting Tue 06/01/2021, Print      vitamin B-1 (THIAMINE) 100 MG tablet Take 100 mg by mouth every morning., Historical Med       !! - Potential duplicate medications found. Please discuss with provider.              This note was generated by the Epic EMR system/ Dragon speech recognition and may contain inherent errors or omissions not intended by the user. Grammatical  errors, random word insertions, deletions and pronoun errors  are occasional consequences of this technology due to software limitations. Not all errors are caught or corrected. If there are questions or concerns about the content of this note or information contained within the body of this dictation they should be addressed directly with the author for clarification.

## 2021-11-30 NOTE — EDIE (Signed)
COLLECTIVE?NOTIFICATION?11/30/2021 13:29?Ithzel, Fedorchak B?MRN: [id number]    [place] [facility]'s patient encounter information:   ZHY:?[id number]  Account [id number]  Billing Account [id number]      Criteria Met      5 ED Visits in 12 Months    Security and Safety  No Security Events were found.  ED Care Guidelines  There are currently no ED Care Guidelines for this patient. Please check your facility's medical records system.    Flags      Negative COVID-19 Lab Result - [facility] - A specimen collected from this patient was negative for COVID-19 / Attributed By: [place] Department [facility] / Attributed On: 11/13/2021       Prescription Monitoring Program  000??- Narcotic Use Score  000??- Sedative Use Score  000??- Stimulant Use Score  000??- Overdose Risk Score  - All Scores range from 000-999 with 75% of the population scoring < 200 and on 1% scoring above 650  - The last digit of the narcotic, sedative, and stimulant score indicates the number of active prescriptions of that type  - Higher Use scores correlate with increased prescribers, pharmacies, mg equiv, and overlapping prescriptions  - Higher Overdose Risk Scores correlate with increased risk of unintentional overdose death   Concerning or unexpectedly high scores should prompt a review of the PMP record; this does not constitute checking PMP for prescribing purposes.    E.D. Visit Count (12 mo.)  Facility Visits   [place] [facility] 9   Total 9   Note: Visits indicate total known visits.     Recent Emergency Department Visit Summary  Date Facility [facility] Type Diagnoses or Chief Complaint    Nov 30, 2021  [place] [facility]  [place]  [place]  Emergency      Weakness; Fatigue      Nov 17, 2021  [place] [facility]  [place]  [place]  Emergency      Pain in right side, breast cancer patient      Dysuria      Abdominal Pain      Nausea      Scar Tissue Pain      Calculus of gallbladder without cholecystitis without obstruction       Unspecified abdominal pain      Nov 11, 2021  [place] [facility]  [place]  [place]  Emergency      chest pain,dizziness,weakness      Syncope      Chest Pain      Paresthesia of skin      Chest pain, unspecified      Acute kidney failure, unspecified      Syncope and collapse      Dizziness and giddiness      Nontoxic single thyroid nodule      Oct 15, 2021  [place] [facility]  [place]  [place]  Emergency      palpitations shortness of breath      Palpitations      Other chest pain      May 22, 2021  [place] [facility]  [place]  [place]  Emergency      Abd Pain/Weakness      Dizziness      Abdominal Pain      Nausea      Diarrhea      Right lower quadrant pain      Vascular disorder of intestine, unspecified  Apr 21, 2021  [place] [facility]  [place]  [place]  Emergency      headache, body aches, leg tingling      Dizziness      Chest wall pain      Chondrocostal junction syndrome [[place]]      Mar 08, 2021  [place] [facility]  [place]  [place]  Emergency      Medic      Altered Mental Status      Altered mental status, unspecified      Mar 04, 2021  [place] [facility]  [place]  [place]  Emergency      cough/body aches      Generalized Body Aches      Myalgia, unspecified site      Sciatica, left side      Contact with and (suspected) exposure to COVID-19      Urinary tract infection, site not specified      Jan 03, 2021  [place] [facility]  [place]  [place]  Emergency      pain in both legs/cough      Generalized weakness      Dizziness      Chest Pain      Weakness      Paresthesia of skin      Dizziness and giddiness      Chest pain, unspecified        Recent Inpatient Visit Summary  Date Facility [facility] Type Diagnoses or Chief Complaint    May 22, 2021  [place] [facility]  [place]  [place]  Medical Surgical      Vascular disorder of intestine, unspecified      Right lower quadrant pain      Estrogen receptor positive status [ER+]      Malignant neoplasm of overlapping sites of right female breast        Care  Team  Provider Specialty Phone Fax Service Dates   [place], [facility] , DO Internal Medicine   Current    Rondel Baton, M.D. Internal Medicine   Current    DAVIDSON, Janet [place] MD Judie Petit, MD Family Medicine: Adult Medicine [phone] Current    Santa Lighter, M.D. Internal Medicine   Current    Cornelius Moras, NP Nurse Practitioner: Adult Health   Current    Sandria Bales Nurse Practitioner: Gerontology [phone]  Current      Collective Portal  This patient has registered at the [facility] Emergency Department   For more information visit: [email or web address]     PLEASE NOTE:     1.   Any care recommendations and other clinical information are provided as guidelines or for historical purposes only, and providers should exercise their own clinical judgment when providing care.    2.   You may only use this information for purposes of treatment, payment or health care operations activities, and subject to the limitations of applicable Collective Policies.    3.   You should consult directly with the organization that provided a care guideline or other clinical history with any questions about additional information or accuracy or completeness of information provided.    ? 2023 [place] - [email or web address]

## 2021-12-09 ENCOUNTER — Ambulatory Visit
Admission: RE | Admit: 2021-12-09 | Discharge: 2021-12-09 | Disposition: A | Discharge status: Home / Self Care | Payer: Medicare Other | Source: Ambulatory Visit | Attending: Hematology & Oncology | Admitting: Hematology & Oncology

## 2021-12-09 ENCOUNTER — Other Ambulatory Visit: Payer: Self-pay | Admitting: Hematology & Oncology

## 2021-12-09 DIAGNOSIS — C50919 Malignant neoplasm of unspecified site of unspecified female breast: Secondary | ICD-10-CM

## 2021-12-09 DIAGNOSIS — N6320 Unspecified lump in the left breast, unspecified quadrant: Secondary | ICD-10-CM | POA: Insufficient documentation

## 2021-12-17 ENCOUNTER — Ambulatory Visit (INDEPENDENT_AMBULATORY_CARE_PROVIDER_SITE_OTHER): Payer: Medicare Other | Admitting: Cardiovascular Disease

## 2021-12-17 VITALS — BP 115/67 | HR 79 | Ht 63.0 in | Wt 149.0 lb

## 2021-12-17 DIAGNOSIS — I10 Essential (primary) hypertension: Secondary | ICD-10-CM

## 2021-12-17 DIAGNOSIS — R072 Precordial pain: Secondary | ICD-10-CM

## 2021-12-17 LAB — ECG 12-LEAD
Atrial Rate: 79 {beats}/min
P Axis: 61 degrees
P-R Interval: 184 ms
Q-T Interval: 338 ms
QRS Duration: 88 ms
QTC Calculation (Bezet): 387 ms
R Axis: 52 degrees
T Axis: 34 degrees
Ventricular Rate: 79 {beats}/min

## 2021-12-17 NOTE — Progress Notes (Signed)
[facility]    Assessment and Plan     76 yo female who presents for inframammary chest pain.  Symptoms     Hypertension  Hypercholesterolemia  Echo preserved EF in 2021  Hospital stay December 2021 with arm numbness but no acute event by MRI or clinically  Cervical and thoracic spondylosis noted  Musculoskeletal left shoulder pain possible rotator cuff injury, noncardiac -- see ortho surgery, PT, or neurology      Plan:  Lexiscan   See Dr. Franchot Erichsen after        History of Present Illness     Seen by Dr. Franchot Erichsen in May 2022.    76 y.o. female with a ry of atypical chest discomfort July 2020 that led to a normal Lexiscan nuclear scan and echocardiogram both July 2020 with ejection fraction of 60% and no significant valvular abnormality.     The patient was hospitalized with numbness in the left arm at the end of December 2021 and at that time it was not clear that she had any central neurologic event with a negative MRI and no findings to suggest that with neurology.      Historically, patient has been having left shoulder and left upper chest discomfort especially if she tries to lift her left arm or if she lays on her left side.     Patient was sent in by PCP after reporting left inframammary discomfort with walking.  Not consistent, atypical sounding.  Continues to report left neck, shoulder and arm discomfort more than inframammary discomfort.  No syncope.    Notes minor LE edema, symmetric, possibly due to Norvasc.  Not bothersome but noted by pt.  Discussed monitoring, reducing salt, observing for asymmetry.  Pt was seen by a provider in 2022 who ordered Lexiscan but it was never performed.    She would feel mor comfortable undergoing stress testing given her symptoms.      Past Medical History     Past Medical History:   Diagnosis Date    Breast cancer 2014    right breast mastectomy    Exercise-induced angina     negative work up    GERD (gastroesophageal reflux disease)     Headache     resolved     Hyperlipidemia     Hypertension     Hypothyroidism     Low back pain     Malignant neoplasm of overlapping sites of right female breast 10/19/2015    Syncope and collapse     Chronic Vertigo    Vertigo        Past Surgical History     Past Surgical History:   Procedure Laterality Date    COLONOSCOPY  2021    COLONOSCOPY, POLYPECTOMY N/A 07/21/2020    Procedure: COLONOSCOPY, POLYPECTOMY;  Surgeon: Einar Grad, MD;  Location: [facility];  Service: Gastroenterology;  Laterality: N/A;    EGD, BIOPSY N/A 07/21/2020    Procedure: EGD, BIOPSY;  Surgeon: Einar Grad, MD;  Location: [facility];  Service: Gastroenterology;  Laterality: N/A;    EXPLORATORY LAPAROTOMY N/A 05/22/2021    Procedure: EXPLORATORY LAPAROTOMY;  Surgeon: Delena Serve, MD;  Location: [facility];  Service: General;  Laterality: N/A;    HYSTERECTOMY      LAPAROSCOPY, DIAGNOSTIC N/A 05/22/2021    Procedure: LAPAROSCOPY, DIAGNOSTIC;  Surgeon: Delena Serve, MD;  Location: [facility];  Service: General;  Laterality: N/A;    LAPAROTOMY, COLECTOMY, RIGHT Right  05/22/2021    Procedure: LAPAROTOMY, COLECTOMY, RIGHT;  Surgeon: Delena Serve, MD;  Location: [facility];  Service: General;  Laterality: Right;    MASTECTOMY Right 2015       Family History     Family History   Problem Relation Age of Onset    Breast cancer Neg Hx        Social History     Social History     Socioeconomic History    Marital status: Widowed   Tobacco Use    Smoking status: Never    Smokeless tobacco: Never   Vaping Use    Vaping status: Never Used   Substance and Sexual Activity    Alcohol use: Not Currently     Comment: occ    Drug use: No    Sexual activity: Not Currently       Allergies     Allergies   Allergen Reactions    Morphine Itching    Morphine And Related Itching and Nausea And Vomiting     dizzy       Medications     Current Outpatient Medications   Medication Sig Dispense Refill    acetaminophen (TYLENOL) 325 MG tablet Take 2 tablets  (650 mg) by mouth every 6 (six) hours as needed for Pain 30 tablet 0    amLODIPine (NORVASC) 10 MG tablet Take 1 tablet (10 mg total) by mouth daily 90 tablet 1    anastrozole (ARIMIDEX) 1 MG tablet Take 1 tablet (1 mg total) by mouth every morning 30 tablet 0    Apoaequorin (PREVAGEN PO) Take by mouth daily      aspirin 81 MG chewable tablet Chew 1 tablet (81 mg total) by mouth daily 90 tablet 0    desonide (DESOWEN) 0.05 % ointment Apply topically 2 (two) times daily 15 g 0    diclofenac Sodium (VOLTAREN) 1 % Gel topical gel Apply 2 g topically 4 (four) times daily 50 g 0    ezetimibe (ZETIA) 10 MG tablet Take 1 tablet (10 mg total) by mouth daily 90 tablet 3    Ferrous Sulfate (IRON PO) Take by mouth      gabapentin (NEURONTIN) 100 MG capsule Take 1 capsule (100 mg total) by mouth 2 (two) times daily as needed (Neuropathy) 60 capsule 0    guaiFENesin (MUCINEX) 600 MG 12 hr tablet Take 2 tablets (1,200 mg total) by mouth 2 (two) times daily 14 tablet 0    ibuprofen (ADVIL) 600 MG tablet Take 1 tablet (600 mg) by mouth every 6 (six) hours as needed for Pain 30 tablet 0    lactobacillus/streptococcus (RISAQUAD) Cap Take 1 capsule by mouth daily 60 capsule 0    levothyroxine (SYNTHROID) 25 MCG tablet Take 1 tablet (25 mcg total) by mouth every morning 30 tablet 0    lisinopril (ZESTRIL) 40 MG tablet Take 1 tablet (40 mg total) by mouth daily 90 tablet 1    magnesium oxide (MAG-OX) 400 MG tablet Take 1 tablet (400 mg total) by mouth daily 30 tablet 0    meclizine (ANTIVERT) 25 MG tablet Take 1 tablet (25 mg) by mouth 3 (three) times daily as needed for Dizziness 10 tablet 0    mirtazapine (REMERON) 15 MG tablet Take 1 tablet (15 mg total) by mouth nightly 30 tablet 0    Multiple Vitamins-Minerals (MULTIVITAMIN WITH MINERALS) tablet Take 1 tablet by mouth every morning          ondansetron (  ZOFRAN-ODT) 4 MG disintegrating tablet Take 1 tablet (4 mg) by mouth every 6 (six) hours as needed for Nausea 12 tablet 0     pantoprazole (PROTONIX) 40 MG tablet Take 1 tablet (40 mg total) by mouth every morning before breakfast 30 tablet 0    rosuvastatin (CRESTOR) 40 MG tablet Take 0.5 tablets (20 mg total) by mouth daily 90 tablet 3    traMADol (ULTRAM) 50 MG tablet Take 1 tablet (50 mg total) by mouth every 6 (six) hours as needed for Pain 30 tablet 0    vitamin B-1 (THIAMINE) 100 MG tablet Take 100 mg by mouth every morning.       No current facility-administered medications for this visit.         Review of Systems     Constitutional: Negative for fevers and chills  Skin: No rash or lesions  Respiratory: Negative for cough, wheezing, or hemoptysis  Cardiovascular: as per HPI  Gastrointestinal: Negative for abdominal pain, nausea, vomiting and diarrhea  Musculoskeletal:  No arthritic symptoms  Genitourinary: Negative for dysuria  Otherwise 10 point review of systems is negative.      Physical Exam   BP 115/67   Pulse 79   Ht 1.6 m (5\' 3" )   Wt 67.6 kg (149 lb)   SpO2 98%   BMI 26.39 kg/m     There is no height or weight on file to calculate BMI.    General:  Patient appears their stated age, well-nourished.  Alert and in no apparent distress.  Eyes: No conjunctivitis, no purulent discharge, no lid lag  ENT:  Hearing grossly intact, Nares patent bilaterally, Lips moist, color appropriate for race.  Respiratory: Clear to auscultation and percussion throughout. Respiratory effort unlabored, chest expansion symmetric.    Cardio: Regular rate and rhythm. Normal S1/S2 No carotid bruits or thrills, no JVD.  Extremities: warm, pulses 2+, no peripheral edema  GI: Soft, nondistended, nontender.  No guarding or rebound.  Skin: Color appropriate for race, Skin warm, dry, and intact  Psychiatric: Good insight and judgment, oriented to person, place, and time    Labs     Lipid Panel   Cholesterol   Date/Time Value Ref Range Status   03/09/2021 05:54 AM 196 0 - 199 mg/dL Final     Triglycerides   Date/Time Value Ref Range Status   03/09/2021  05:54 AM 50 34 - 149 mg/dL Final     HDL   Date/Time Value Ref Range Status   03/09/2021 05:54 AM 61 40 - 9,999 mg/dL Final     Comment:     An HDL cholesterol <40 mg/dL is low and constitutes a  coronary heart disease risk factor, and HDL-C>59 mg/dL is  a negative risk factor for CHD.  Ref: American Heart Association; Circulation 2004       CBC   Lab Results   Component Value Date    WBC 6.33 11/30/2021    HGB 10.4 (L) 11/30/2021    HCT 34.5 (L) 11/30/2021    MCV 82.9 11/30/2021    PLT 364 (H) 11/30/2021      BMP  Lab Results   Component Value Date    CO2 29 11/30/2021    BUN 18.0 11/30/2021     INR   Lab Results   Component Value Date    INR 1.0 11/17/2021    INR 1.0 11/11/2021    INR 1.1 05/22/2021        EKG   I  have reviewed and interpreted the EKG.         I spent 40 mins with the patient, >50% of the time was spent on education and counseling.      Otilio Miu, MD, MD, [facility]

## 2021-12-23 ENCOUNTER — Ambulatory Visit (INDEPENDENT_AMBULATORY_CARE_PROVIDER_SITE_OTHER): Payer: Medicare Other | Admitting: Registered Nurse

## 2022-01-03 ENCOUNTER — Ambulatory Visit: Payer: Medicare Other

## 2022-01-03 ENCOUNTER — Ambulatory Visit: Admission: RE | Admit: 2022-01-03 | Payer: Medicare Other | Source: Ambulatory Visit

## 2022-01-14 ENCOUNTER — Inpatient Hospital Stay: Payer: Medicare Other

## 2022-01-14 ENCOUNTER — Emergency Department: Payer: Medicare Other

## 2022-01-14 ENCOUNTER — Observation Stay
Admission: EM | Admit: 2022-01-14 | Discharge: 2022-01-16 | Disposition: A | Discharge status: Home / Self Care | Payer: Medicare Other | Attending: Internal Medicine | Admitting: Internal Medicine

## 2022-01-14 DIAGNOSIS — Z79899 Other long term (current) drug therapy: Secondary | ICD-10-CM | POA: Insufficient documentation

## 2022-01-14 DIAGNOSIS — E782 Mixed hyperlipidemia: Secondary | ICD-10-CM | POA: Diagnosis present

## 2022-01-14 DIAGNOSIS — E039 Hypothyroidism, unspecified: Secondary | ICD-10-CM | POA: Diagnosis present

## 2022-01-14 DIAGNOSIS — I10 Essential (primary) hypertension: Secondary | ICD-10-CM | POA: Diagnosis present

## 2022-01-14 DIAGNOSIS — Z791 Long term (current) use of non-steroidal anti-inflammatories (NSAID): Secondary | ICD-10-CM | POA: Insufficient documentation

## 2022-01-14 DIAGNOSIS — H547 Unspecified visual loss: Secondary | ICD-10-CM

## 2022-01-14 DIAGNOSIS — E785 Hyperlipidemia, unspecified: Secondary | ICD-10-CM | POA: Insufficient documentation

## 2022-01-14 DIAGNOSIS — R531 Weakness: Secondary | ICD-10-CM | POA: Diagnosis present

## 2022-01-14 DIAGNOSIS — E7800 Pure hypercholesterolemia, unspecified: Secondary | ICD-10-CM

## 2022-01-14 DIAGNOSIS — K219 Gastro-esophageal reflux disease without esophagitis: Secondary | ICD-10-CM | POA: Insufficient documentation

## 2022-01-14 DIAGNOSIS — G459 Transient cerebral ischemic attack, unspecified: Secondary | ICD-10-CM

## 2022-01-14 DIAGNOSIS — R42 Dizziness and giddiness: Secondary | ICD-10-CM | POA: Diagnosis present

## 2022-01-14 DIAGNOSIS — R2990 Unspecified symptoms and signs involving the nervous system: Secondary | ICD-10-CM

## 2022-01-14 DIAGNOSIS — Z853 Personal history of malignant neoplasm of breast: Secondary | ICD-10-CM | POA: Insufficient documentation

## 2022-01-14 DIAGNOSIS — Z7982 Long term (current) use of aspirin: Secondary | ICD-10-CM | POA: Insufficient documentation

## 2022-01-14 LAB — CBC AND DIFFERENTIAL
Absolute NRBC: 0 10*3/uL (ref 0.00–0.00)
Absolute NRBC: 0 10*3/uL (ref 0.00–0.00)
Basophils Absolute Automated: 0.04 10*3/uL (ref 0.00–0.08)
Basophils Absolute Automated: 0.05 10*3/uL (ref 0.00–0.08)
Basophils Automated: 0.4 %
Basophils Automated: 0.5 %
Eosinophils Absolute Automated: 0.11 10*3/uL (ref 0.00–0.44)
Eosinophils Absolute Automated: 0.1 10*3/uL (ref 0.00–0.44)
Eosinophils Automated: 1 %
Eosinophils Automated: 0.9 %
Hematocrit: 34.8 % (ref 34.7–43.7)
Hematocrit: 34.9 % (ref 34.7–43.7)
Hgb: 10.6 g/dL — ABNORMAL LOW (ref 11.4–14.8)
Hgb: 10.6 g/dL — ABNORMAL LOW (ref 11.4–14.8)
Immature Granulocytes Absolute: 0.06 10*3/uL (ref 0.00–0.07)
Immature Granulocytes Absolute: 0.03 10*3/uL (ref 0.00–0.07)
Immature Granulocytes: 0.6 %
Immature Granulocytes: 0.3 %
Instrument Absolute Neutrophil Count: 7.06 10*3/uL — ABNORMAL HIGH (ref 1.10–6.33)
Instrument Absolute Neutrophil Count: 7.35 10*3/uL — ABNORMAL HIGH (ref 1.10–6.33)
Lymphocytes Absolute Automated: 2.38 10*3/uL (ref 0.42–3.22)
Lymphocytes Absolute Automated: 2.35 10*3/uL (ref 0.42–3.22)
Lymphocytes Automated: 22.6 %
Lymphocytes Automated: 22.3 %
MCH: 25.6 pg (ref 25.1–33.5)
MCH: 25.5 pg (ref 25.1–33.5)
MCHC: 30.5 g/dL — ABNORMAL LOW (ref 31.5–35.8)
MCHC: 30.4 g/dL — ABNORMAL LOW (ref 31.5–35.8)
MCV: 84.1 fL (ref 78.0–96.0)
MCV: 84.1 fL (ref 78.0–96.0)
MPV: 11.7 fL (ref 8.9–12.5)
MPV: 11.7 fL (ref 8.9–12.5)
Monocytes Absolute Automated: 0.9 10*3/uL — ABNORMAL HIGH (ref 0.21–0.85)
Monocytes Absolute Automated: 0.68 10*3/uL (ref 0.21–0.85)
Monocytes: 8.5 %
Monocytes: 6.4 %
Neutrophils Absolute: 7.06 10*3/uL — ABNORMAL HIGH (ref 1.10–6.33)
Neutrophils Absolute: 7.35 10*3/uL — ABNORMAL HIGH (ref 1.10–6.33)
Neutrophils: 66.9 %
Neutrophils: 69.6 %
Nucleated RBC: 0 /100 WBC (ref 0.0–0.0)
Nucleated RBC: 0 /100 WBC (ref 0.0–0.0)
Platelets: 363 10*3/uL — ABNORMAL HIGH (ref 142–346)
Platelets: 348 10*3/uL — ABNORMAL HIGH (ref 142–346)
RBC: 4.14 10*6/uL (ref 3.90–5.10)
RBC: 4.15 10*6/uL (ref 3.90–5.10)
RDW: 16 % — ABNORMAL HIGH (ref 11–15)
RDW: 16 % — ABNORMAL HIGH (ref 11–15)
WBC: 10.55 10*3/uL — ABNORMAL HIGH (ref 3.10–9.50)
WBC: 10.56 10*3/uL — ABNORMAL HIGH (ref 3.10–9.50)

## 2022-01-14 LAB — URINALYSIS REFLEX TO MICROSCOPIC EXAM - REFLEX TO CULTURE
Bilirubin, UA: NEGATIVE
Blood, UA: NEGATIVE
Glucose, UA: NEGATIVE
Ketones UA: NEGATIVE
Leukocyte Esterase, UA: NEGATIVE
Nitrite, UA: NEGATIVE
Protein, UR: 30 — AB
Specific Gravity UA: 1.02 (ref 1.001–1.035)
Urine pH: 7 (ref 5.0–8.0)
Urobilinogen, UA: NEGATIVE mg/dL (ref 0.2–2.0)

## 2022-01-14 LAB — COMPREHENSIVE METABOLIC PANEL
ALT: 15 U/L (ref 0–55)
ALT: 12 U/L (ref 0–55)
AST (SGOT): 23 U/L (ref 5–41)
AST (SGOT): 20 U/L (ref 5–41)
Albumin/Globulin Ratio: 1.3 (ref 0.9–2.2)
Albumin/Globulin Ratio: 1.4 (ref 0.9–2.2)
Albumin: 4.2 g/dL (ref 3.5–5.0)
Albumin: 4 g/dL (ref 3.5–5.0)
Alkaline Phosphatase: 84 U/L (ref 37–117)
Alkaline Phosphatase: 84 U/L (ref 37–117)
Anion Gap: 9 (ref 5.0–15.0)
Anion Gap: 9 (ref 5.0–15.0)
BUN: 16 mg/dL (ref 7.0–21.0)
BUN: 14 mg/dL (ref 7.0–21.0)
Bilirubin, Total: 0.2 mg/dL (ref 0.2–1.2)
Bilirubin, Total: 0.3 mg/dL (ref 0.2–1.2)
CO2: 27 mEq/L (ref 17–29)
CO2: 28 mEq/L (ref 17–29)
Calcium: 9.7 mg/dL (ref 7.9–10.2)
Calcium: 9.4 mg/dL (ref 7.9–10.2)
Chloride: 105 mEq/L (ref 99–111)
Chloride: 107 mEq/L (ref 99–111)
Creatinine: 1.3 mg/dL — ABNORMAL HIGH (ref 0.4–1.0)
Creatinine: 1.1 mg/dL — ABNORMAL HIGH (ref 0.4–1.0)
Globulin: 3.2 g/dL (ref 2.0–3.6)
Globulin: 2.9 g/dL (ref 2.0–3.6)
Glucose: 100 mg/dL (ref 70–100)
Glucose: 94 mg/dL (ref 70–100)
Potassium: 4.3 mEq/L (ref 3.5–5.3)
Potassium: 4.1 mEq/L (ref 3.5–5.3)
Protein, Total: 7.4 g/dL (ref 6.0–8.3)
Protein, Total: 6.9 g/dL (ref 6.0–8.3)
Sodium: 141 mEq/L (ref 135–145)
Sodium: 144 mEq/L (ref 135–145)

## 2022-01-14 LAB — GFR
EGFR: 48.1
EGFR: 58.3

## 2022-01-14 LAB — PT/INR
PT INR: 1 (ref 0.9–1.1)
PT: 11.4 s (ref 10.1–12.9)

## 2022-01-14 LAB — HIGH SENSITIVITY TROPONIN-I: hs Troponin-I: 2.9 ng/L

## 2022-01-14 LAB — GLUCOSE WHOLE BLOOD - POCT: Whole Blood Glucose POCT: 102 mg/dL — ABNORMAL HIGH (ref 70–100)

## 2022-01-14 LAB — APTT: PTT: 38 s (ref 27–39)

## 2022-01-14 LAB — HIGH SENSITIVITY TROPONIN-I WITH DELTA
hs Troponin-I Delta: UNDETERMINED ng/L
hs Troponin-I: <2.7 ng/L

## 2022-01-14 MED ORDER — ENOXAPARIN SODIUM 40 MG/0.4ML IJ SOSY
40.0000 mg | PREFILLED_SYRINGE | Freq: Every day | INTRAMUSCULAR | Status: DC
Start: 2022-01-14 — End: 2022-01-16
  Administered 2022-01-14 – 2022-01-16 (×3): 40 mg via SUBCUTANEOUS
  Filled 2022-01-14 (×3): qty 0.4

## 2022-01-14 MED ORDER — ACETAMINOPHEN 325 MG PO TABS
650.0000 mg | ORAL_TABLET | Freq: Four times a day (QID) | ORAL | Status: DC | PRN
Start: 2022-01-14 — End: 2022-01-16
  Administered 2022-01-15 – 2022-01-16 (×2): 650 mg via ORAL
  Filled 2022-01-14 (×2): qty 2

## 2022-01-14 MED ORDER — GADOTERATE MEGLUMINE 7.5 MMOL/15ML IV SOLN (CLARISCAN)
0.2000 mL/kg | Freq: Once | INTRAVENOUS | Status: AC | PRN
Start: 2022-01-14 — End: 2022-01-14
  Administered 2022-01-14: 14 mL via INTRAVENOUS

## 2022-01-14 MED ORDER — ASPIRIN 300 MG RE SUPP
300.0000 mg | Freq: Every day | RECTAL | Status: DC
Start: 2022-01-14 — End: 2022-01-16
  Filled 2022-01-14 (×3): qty 1

## 2022-01-14 MED ORDER — ROSUVASTATIN CALCIUM 10 MG PO TABS
20.0000 mg | ORAL_TABLET | Freq: Every day | ORAL | Status: DC
Start: 2022-01-14 — End: 2022-01-15
  Administered 2022-01-14: 20 mg via ORAL
  Filled 2022-01-14: qty 2

## 2022-01-14 MED ORDER — MIRTAZAPINE 15 MG PO TABS
15.0000 mg | ORAL_TABLET | Freq: Every evening | ORAL | Status: DC
Start: 2022-01-14 — End: 2022-01-16
  Administered 2022-01-14 – 2022-01-15 (×2): 15 mg via ORAL
  Filled 2022-01-14 (×2): qty 1

## 2022-01-14 MED ORDER — BISACODYL 5 MG PO TBEC
5.0000 mg | DELAYED_RELEASE_TABLET | Freq: Every day | ORAL | Status: DC | PRN
Start: 2022-01-14 — End: 2022-01-16

## 2022-01-14 MED ORDER — LISINOPRIL 20 MG PO TABS
40.0000 mg | ORAL_TABLET | Freq: Every day | ORAL | Status: DC
Start: 2022-01-15 — End: 2022-01-16
  Administered 2022-01-15: 40 mg via ORAL
  Filled 2022-01-14 (×2): qty 2

## 2022-01-14 MED ORDER — LEVOTHYROXINE SODIUM 25 MCG PO TABS
25.0000 ug | ORAL_TABLET | Freq: Every day | ORAL | Status: DC
Start: 2022-01-15 — End: 2022-01-16
  Administered 2022-01-15 – 2022-01-16 (×2): 25 ug via ORAL
  Filled 2022-01-14 (×2): qty 1

## 2022-01-14 MED ORDER — DEXTROSE 50 % IV SOLN
12.5000 g | INTRAVENOUS | Status: DC | PRN
Start: 2022-01-14 — End: 2022-01-16

## 2022-01-14 MED ORDER — ANASTROZOLE 1 MG PO TABS
1.0000 mg | ORAL_TABLET | Freq: Every morning | ORAL | Status: DC
  Administered 2022-01-14 – 2022-01-16 (×3): 1 mg via ORAL
  Filled 2022-01-14 (×3): qty 1

## 2022-01-14 MED ORDER — IOHEXOL 350 MG/ML IV SOLN
40.0000 mL | Freq: Once | INTRAVENOUS | Status: AC | PRN
Start: 2022-01-14 — End: 2022-01-14
  Administered 2022-01-14: 40 mL via INTRAVENOUS

## 2022-01-14 MED ORDER — PANTOPRAZOLE SODIUM 40 MG PO TBEC
40.0000 mg | DELAYED_RELEASE_TABLET | Freq: Every morning | ORAL | Status: DC
Start: 2022-01-14 — End: 2022-01-16
  Administered 2022-01-14 – 2022-01-16 (×3): 40 mg via ORAL
  Filled 2022-01-14 (×3): qty 1

## 2022-01-14 MED ORDER — ONDANSETRON HCL 4 MG/2ML IJ SOLN
4.0000 mg | Freq: Four times a day (QID) | INTRAMUSCULAR | Status: DC | PRN
Start: 2022-01-14 — End: 2022-01-16

## 2022-01-14 MED ORDER — ONDANSETRON 4 MG PO TBDP
4.0000 mg | ORAL_TABLET | Freq: Four times a day (QID) | ORAL | Status: DC | PRN
Start: 2022-01-14 — End: 2022-01-16

## 2022-01-14 MED ORDER — NALOXONE HCL 0.4 MG/ML IJ SOLN (WRAP)
0.2000 mg | INTRAMUSCULAR | Status: DC | PRN
Start: 2022-01-14 — End: 2022-01-16

## 2022-01-14 MED ORDER — MELATONIN 3 MG PO TABS
3.0000 mg | ORAL_TABLET | Freq: Every evening | ORAL | Status: DC | PRN
Start: 2022-01-14 — End: 2022-01-16
  Administered 2022-01-15: 3 mg via ORAL
  Filled 2022-01-14: qty 1

## 2022-01-14 MED ORDER — AMLODIPINE BESYLATE 5 MG PO TABS
10.0000 mg | ORAL_TABLET | Freq: Every day | ORAL | Status: DC
Start: 2022-01-14 — End: 2022-01-14

## 2022-01-14 MED ORDER — ASPIRIN 81 MG PO CHEW
81.0000 mg | CHEWABLE_TABLET | Freq: Every day | ORAL | Status: DC
Start: 2022-01-14 — End: 2022-01-14

## 2022-01-14 MED ORDER — AMLODIPINE BESYLATE 5 MG PO TABS
10.0000 mg | ORAL_TABLET | Freq: Every day | ORAL | Status: DC
Start: 2022-01-15 — End: 2022-01-16
  Administered 2022-01-15 – 2022-01-16 (×2): 10 mg via ORAL
  Filled 2022-01-14 (×2): qty 2

## 2022-01-14 MED ORDER — ACETAMINOPHEN 650 MG RE SUPP
650.0000 mg | Freq: Four times a day (QID) | RECTAL | Status: DC | PRN
Start: 2022-01-14 — End: 2022-01-16

## 2022-01-14 MED ORDER — SODIUM CHLORIDE 0.9 % IV BOLUS
1000.0000 mL | Freq: Once | INTRAVENOUS | Status: AC
Start: 2022-01-14 — End: 2022-01-14
  Administered 2022-01-14: 1000 mL via INTRAVENOUS

## 2022-01-14 MED ORDER — DEXTROSE 10 % IV BOLUS
12.5000 g | INTRAVENOUS | Status: DC | PRN
Start: 2022-01-14 — End: 2022-01-16

## 2022-01-14 MED ORDER — IOHEXOL 350 MG/ML IV SOLN
80.0000 mL | Freq: Once | INTRAVENOUS | Status: AC | PRN
Start: 2022-01-14 — End: 2022-01-14
  Administered 2022-01-14: 80 mL via INTRAVENOUS

## 2022-01-14 MED ORDER — GLUCOSE 40 % PO GEL (WRAP)
15.0000 g | ORAL | Status: DC | PRN
Start: 2022-01-14 — End: 2022-01-16

## 2022-01-14 MED ORDER — GLUCAGON 1 MG IJ SOLR (WRAP)
1.0000 mg | INTRAMUSCULAR | Status: DC | PRN
Start: 2022-01-14 — End: 2022-01-16

## 2022-01-14 MED ORDER — MECLIZINE HCL 12.5 MG PO TABS
25.0000 mg | ORAL_TABLET | Freq: Once | ORAL | Status: DC
Start: 2022-01-14 — End: 2022-01-16

## 2022-01-14 MED ORDER — ASPIRIN 81 MG PO CHEW
81.0000 mg | CHEWABLE_TABLET | Freq: Every day | ORAL | Status: DC
Start: 2022-01-14 — End: 2022-01-16
  Administered 2022-01-14 – 2022-01-16 (×3): 81 mg via ORAL
  Filled 2022-01-14 (×3): qty 1

## 2022-01-14 NOTE — H&P (Signed)
ADMISSION HISTORY AND PHYSICAL EXAM    [facility], DIVISION OF HOSPITALIST MEDICINE   [facility]   [place] Pager: [phone]      Date Time: 01/14/22 3:19 PM  Patient Name: Emily [name]  Attending Physician: Macky Lower, MD  Primary Care Physician: Rondel Baton, MD    CC: Posterior central  History Gathered From: Self    Assessment:   There are no active hospital problems to display for this patient.    Emily [name] is a 76 y.o. female with a PMHx of hypertension, hyperlipidemia, GERD, hypothyroidism, low back pain, breast cancer, and chronic vertigo presented to the ER with posterior syndrome.     Plan:   -Admit to [facility] service  # posterior Syndromes R/O stroke/TIA:   Numbness and left leg weakness, vertigo, and vision problem.  Symptoms started around 8 or 9 last night.  Patient was admitted to rule out stroke.    Patient received meclizine 25 mg in the ER  Received sodium chloride bolus 1000 mL  - Neurology was consulted.    - Dr. Francesco Sor saw the patient in the ER.    - CT perfusion studies are negative.    Patient is not a candidate for thrombolytic.     - MRI was ordered.    If MRI is negative for acute ischemia patient will be cleared from neurology standpoint.  - Patient is already on aspirin 81 mg   - she took one pill this morning.  - PT/OT/speech evaluation  - Telemetry monitor  - Fall precaution    #Breast cancer:  Home regimen: Anastrozole 1 mg, mirtazapine 15 mg    #Hypertension  #Hyperlipidemia  -Continue home: Amlodipine 10 mg, lisinopril 40 mg, and rosuvastatin 20 mg  -Continue to monitor BP.    #GERD:  -Continue home med: Pantoprazole 40 mg daily    #Hypothyroidism:  -Check TSH.  -Continue home levothyroxine 25 mcg PO q AM.     DVT prophylaxis: Lovenox 40 mg subcu daily         Patient has BMI=Body mass index is 27.18 kg/m.  Diagnosis: Overweight based on BMI criteria         Nutrition:No diet orders on file    Code status: Full code      Status/Disposition:   Pt is admitted under OBSERVATION with above concerns.    Anticipated medical stability for discharge: 24 Hrs       History of Presenting Illnes   Emily [name] is a 77 y.o. female with  PMHx of hypertension, hyperlipidemia, GERD, hypothyroidism, low back pain, breast cancer, and chronic vertigo presented to the ER with posterior syndrome.  She reports the symptoms started last night around 8:30 PM associated with right-sided facial numbness and left-sided upper and lower extremity subjective weakness.  Patient is also having lightheadedness and dizziness and difficulty ambulating appropriately.  Patient is also complaining of vision problems.  Patient was admitted to rule out stroke.  Patient was seen by neurology.  CT scans negative.  MRIs done await results.  All neurological testing were normal.  Patient stated she was out of her medication and she has not been taking them for a few days.  She has an appointment with her doctor next week.  So far the only medication she still has is her blood pressure medication and her chemo medication.  She has not been taking her levothyroxine for over a week.  Past Medical Histor  Past Medical History:   Diagnosis Date    Breast cancer 2014    right breast mastectomy    Exercise-induced angina     negative work up    GERD (gastroesophageal reflux disease)     Headache     resolved    Hyperlipidemia     Hypertension     Hypothyroidism     Low back pain     Malignant neoplasm of overlapping sites of right female breast 10/19/2015    Syncope and collapse     Chronic Vertigo    Vertigo          Available old records reviewed, including: EPIC     Past Surgical History:     Past Surgical History:   Procedure Laterality Date    COLONOSCOPY  2021    COLONOSCOPY, POLYPECTOMY N/A 07/21/2020    Procedure: COLONOSCOPY, POLYPECTOMY;  Surgeon: Einar Grad, MD;  Location: [facility];  Service: Gastroenterology;  Laterality: N/A;    EGD, BIOPSY N/A  07/21/2020    Procedure: EGD, BIOPSY;  Surgeon: Einar Grad, MD;  Location: [facility];  Service: Gastroenterology;  Laterality: N/A;    EXPLORATORY LAPAROTOMY N/A 05/22/2021    Procedure: EXPLORATORY LAPAROTOMY;  Surgeon: Delena Serve, MD;  Location: [facility];  Service: General;  Laterality: N/A;    HYSTERECTOMY      LAPAROSCOPY, DIAGNOSTIC N/A 05/22/2021    Procedure: LAPAROSCOPY, DIAGNOSTIC;  Surgeon: Delena Serve, MD;  Location: [facility];  Service: General;  Laterality: N/A;    LAPAROTOMY, COLECTOMY, RIGHT Right 05/22/2021    Procedure: LAPAROTOMY, COLECTOMY, RIGHT;  Surgeon: Delena Serve, MD;  Location: [facility];  Service: General;  Laterality: Right;    MASTECTOMY Right 2015       Family History:     Family History   Problem Relation Age of Onset    Breast cancer Neg Hx        Social History:    reports that she has never smoked. She has never used smokeless tobacco. She reports that she does not currently use alcohol. She reports that she does not use drugs.    Allergies:     Allergies   Allergen Reactions    Morphine Itching    Morphine And Related Itching and Nausea And Vomiting     dizzy       Medications:     Home Medications               acetaminophen (TYLENOL) 325 MG tablet     Take 2 tablets (650 mg) by mouth every 6 (six) hours as needed for Pain     amLODIPine (NORVASC) 10 MG tablet     Take 1 tablet (10 mg total) by mouth daily     anastrozole (ARIMIDEX) 1 MG tablet     Take 1 tablet (1 mg total) by mouth every morning     Apoaequorin (PREVAGEN PO)     Take by mouth daily     aspirin 81 MG chewable tablet     Chew 1 tablet (81 mg total) by mouth daily     desonide (DESOWEN) 0.05 % ointment     Apply topically 2 (two) times daily     diclofenac Sodium (VOLTAREN) 1 % Gel topical gel     Apply 2 g topically 4 (four) times daily     ezetimibe (ZETIA) 10 MG tablet     Take 1 tablet (  10 mg total) by mouth daily     Ferrous Sulfate (IRON PO)     Take by mouth      gabapentin (NEURONTIN) 100 MG capsule     Take 1 capsule (100 mg total) by mouth 2 (two) times daily as needed (Neuropathy)     guaiFENesin (MUCINEX) 600 MG 12 hr tablet     Take 2 tablets (1,200 mg total) by mouth 2 (two) times daily     ibuprofen (ADVIL) 600 MG tablet     Take 1 tablet (600 mg) by mouth every 6 (six) hours as needed for Pain     lactobacillus/streptococcus (RISAQUAD) Cap     Take 1 capsule by mouth daily     levothyroxine (SYNTHROID) 25 MCG tablet     Take 1 tablet (25 mcg total) by mouth every morning     lisinopril (ZESTRIL) 40 MG tablet     Take 1 tablet (40 mg total) by mouth daily     magnesium oxide (MAG-OX) 400 MG tablet     Take 1 tablet (400 mg total) by mouth daily     meclizine (ANTIVERT) 25 MG tablet     Take 1 tablet (25 mg) by mouth 3 (three) times daily as needed for Dizziness     mirtazapine (REMERON) 15 MG tablet     Take 1 tablet (15 mg total) by mouth nightly     Multiple Vitamins-Minerals (MULTIVITAMIN WITH MINERALS) tablet     Take 1 tablet by mouth every morning         ondansetron (ZOFRAN-ODT) 4 MG disintegrating tablet     Take 1 tablet (4 mg) by mouth every 6 (six) hours as needed for Nausea     pantoprazole (PROTONIX) 40 MG tablet     Take 1 tablet (40 mg total) by mouth every morning before breakfast     rosuvastatin (CRESTOR) 40 MG tablet     Take 0.5 tablets (20 mg total) by mouth daily     traMADol (ULTRAM) 50 MG tablet     Take 1 tablet (50 mg total) by mouth every 6 (six) hours as needed for Pain     vitamin B-1 (THIAMINE) 100 MG tablet     Take 100 mg by mouth every morning.            Method by which medications were confirmed on admission: EMR      Review of Systems:   All other systems were reviewed and are negative except:as above in HPI     Physical Exam:   Patient Vitals for the past 24 hrs:   BP Temp Temp src Pulse Resp SpO2 Height Weight   01/14/22 1457 125/61 -- -- 69 22 97 % -- --   01/14/22 1349 132/60 98.4 F (36.9 C) Oral 72 18 99 % 1.6 m (5\' 3" )  69.6 kg (153 lb 7 oz)     Body mass index is 27.18 kg/m.  No intake or output data in the 24 hours ending 01/14/22 1519    General: WD female in no acute distress.  HEENT: perrla, eomi, sclera anicteric  oropharynx clear without lesions, mucous membranes moist  Neck: Supple, full range of motion.  Cardiovascular: Normal heart rate, regular rhythm.  Lungs: No respiratory distress, lung sounds clear to auscultation bilaterally.  Abdomen: Abdomen soft nontender bowel sounds present in all quadrants  Extremities: Able to move all extremities, no ankle edema.  Neuro: Alert and oriented x3 all neurochecks are normal.  Skin: Skin warm and dry no cyanosis.        Labs:     Results       Procedure Component Value Units Date/Time    High Sensitivity Troponin-I at 0 hrs [[id number]] Collected: 01/14/22 1403    Specimen: Blood Updated: 01/14/22 1452     hs Troponin-I 2.9 ng/L     Comprehensive metabolic panel [[id number]]  (Abnormal) Collected: 01/14/22 1403    Specimen: Blood Updated: 01/14/22 1450     Glucose 100 mg/dL      BUN 78.2 mg/dL      Creatinine 1.3 mg/dL      Sodium 956 mEq/L      Potassium 4.3 mEq/L      Chloride 105 mEq/L      CO2 27 mEq/L      Calcium 9.7 mg/dL      Protein, Total 7.4 g/dL      Albumin 4.2 g/dL      AST (SGOT) 23 U/L      ALT 15 U/L      Alkaline Phosphatase 84 U/L      Bilirubin, Total 0.2 mg/dL      Globulin 3.2 g/dL      Albumin/Globulin Ratio 1.3     Anion Gap 9.0    GFR [[id number]] Collected: 01/14/22 1403     Updated: 01/14/22 1450     EGFR 48.1    Prothrombin time/INR [[id number]] Collected: 01/14/22 1403    Specimen: Blood Updated: 01/14/22 1448     PT 11.4 sec      PT INR 1.0    APTT [[id number]] Collected: 01/14/22 1403     Updated: 01/14/22 1448     PTT 38 sec     CBC and differential [[id number]]  (Abnormal) Collected: 01/14/22 1403    Specimen: Blood Updated: 01/14/22 1436     WBC 10.55 x10 3/uL      Hgb 10.6 g/dL      Hematocrit 03.4 %      Platelets 363 x10 3/uL      RBC 4.14 x10 6/uL       MCV 84.1 fL      MCH 25.6 pg      MCHC 30.5 g/dL      RDW 16 %      MPV 11.7 fL      Instrument Absolute Neutrophil Count 7.06 x10 3/uL      Neutrophils 66.9 %      Lymphocytes Automated 22.6 %      Monocytes 8.5 %      Eosinophils Automated 1.0 %      Basophils Automated 0.4 %      Immature Granulocytes 0.6 %      Nucleated RBC 0.0 /100 WBC      Neutrophils Absolute 7.06 x10 3/uL      Lymphocytes Absolute Automated 2.38 x10 3/uL      Monocytes Absolute Automated 0.90 x10 3/uL      Eosinophils Absolute Automated 0.11 x10 3/uL      Basophils Absolute Automated 0.04 x10 3/uL      Immature Granulocytes Absolute 0.06 x10 3/uL      Absolute NRBC 0.00 x10 3/uL     Glucose Whole Blood - POCT [[id number]]  (Abnormal) Collected: 01/14/22 1353     Updated: 01/14/22 1408     Whole Blood Glucose POCT 102 mg/dL              EKG: ordered    Imaging personally reviewed,  including: all available   CTA  Head & Neck    Result Date: 01/14/2022   No occlusion or hemodynamically significant stenosis of the major intracranial arteries. No carotid or vertebral stenosis. Normal CT perfusion. Multinodular goiter. If not previously performed consider further evaluation with thyroid ultrasound on nonemergent basis. Comment: Assessment for acute infarct, threshold for ischemia tissue volume is Tmax > 6 s. Threshold for infarct core volume estimated CBF <30 percent. Threshold for poor collateral flow or severe delayed perfusion is Tmax>10 sec. Nelya Ebadirad 01/14/2022 2:53 PM    CT Perfusion Brain    Result Date: 01/14/2022   No occlusion or hemodynamically significant stenosis of the major intracranial arteries. No carotid or vertebral stenosis. Normal CT perfusion. Multinodular goiter. If not previously performed consider further evaluation with thyroid ultrasound on nonemergent basis. Comment: Assessment for acute infarct, threshold for ischemia tissue volume is Tmax > 6 s. Threshold for infarct core volume estimated CBF <30 percent.  Threshold for poor collateral flow or severe delayed perfusion is Tmax>10 sec. Nelya Ebadirad 01/14/2022 2:53 PM    CT Head without Contrast    Result Date: 01/14/2022  No acute intracranial abnormality. CT can be relatively insensitive for acute infarct in the first 24-48 hours, and MRI should be considered if there is high clinical concern. Christy Gentles MD, MD 01/14/2022 2:29 PM     Safety Checklist  DVT prophylaxis:  CHEST guideline (See page e199S) Chemical and Mechanical     This note was generated by the Epic EMR system/ Dragon speech recognition and may contain inherent errors or omissions not intended by the user. Grammatical errors, random word insertions, deletions and pronoun errors  are occasional consequences of this technology due to software limitations. Not all errors are caught or corrected. If there are questions or concerns about the content of this note or information contained within the body of this dictation they should be addressed directly with the author for clarification.    Signed by: Alvy Beal, DNP FNP,    WU:[id number], Collie Siad, MD     Physical exam:   General: No apparent distress.  HEENT: Normocephalic, Conjunctiva pink, anicteric, ENT normal, oropharynx moist.   Neck: Supple, No adenopathy.  Respiratory: Clear to auscultation bilaterally  Cardiovascular: S1 S2 normal, RRR,  intact distal pulses.  Abdominal: Soft, non-tender, no organomegaly, BS positive.  Extremities: No cyanosis or edema.  Neurological: Alert, oriented X 3, No focal neurological deficit.  Lymph: No lymphadenopathy.  Skin: Warm and dry, no suspicious lesions.  Psychiatric: Behavior is appropriate, mood normal, no suicidal ideation     Assessment/plan    Stroke evaluation: MRI to rule out ischemia.  If positive will need ECHO and longer term arrhythmia monitoring.  ASA, statin, dysphagia passed at bedside.     I have personally obtained history and examined the patient. I have reviewed the clinical data  including old records, x-rays, CT Scans, and blood test results. I have discussed the plan of care with the PA/NP, and I agree with their evaluation and management plan as stated in the note.   Sim Boast, MD, FACP  01/14/2022, 7:07 PM      Time spent on patient care for this patient was 77 minutes.      Disclaimer:     This electronic message and its contents and attachments contain information from [facility] and is confidential or otherwise protected from disclosure. The information is intended to be for the addressee only.  If you are not the addressee, any disclosure, copy, distribution or use of the contents of this message is prohibited. If you have received this electronic message in error, please notify us immediately and destroy the original message and all copies.

## 2022-01-14 NOTE — ED Provider Notes (Signed)
EMERGENCY DEPARTMENT NOTE     Patient initially seen and examined at   ED PHYSICIAN ASSIGNED       Date/Time Event User Comments    01/14/22 1358 Physician Assigned Emilie Rutter, MD assigned as Attending     HISTORY OF PRESENT ILLNESS   Patient Name: Emily [name]   Medical Record Number: [id number]  AGE: 76 y.o.  DOB: 01-May-1946    Chief Complaint: Cerebrovascular Accident       Emily [name] is a 76 y.o. female who presents with dizziness and difficulty ambulating that started since 8 PM yesterday.  Patient reports that she started having right facial symptoms and left hand and lower leg weakness since yesterday evening.  Patient reports that this morning she was having some vision problems and prompted her to come to the emergency room for further evaluation.  Patient denies any chest pain shortness of breath or any other symptoms.  Patient was able to ambulate but is having some dizziness.    Independent Historian (other than patient):   Additional History Provided by Independent Historian:  MEDICAL HISTORY     Past Medical History:  Past Medical History:   Diagnosis Date    Breast cancer 2014    right breast mastectomy    Exercise-induced angina     negative work up    GERD (gastroesophageal reflux disease)     Headache     resolved    Hyperlipidemia     Hypertension     Hypothyroidism     Low back pain     Malignant neoplasm of overlapping sites of right female breast 10/19/2015    Syncope and collapse     Chronic Vertigo    Vertigo        Past Surgical History:  Past Surgical History:   Procedure Laterality Date    COLONOSCOPY  2021    COLONOSCOPY, POLYPECTOMY N/A 07/21/2020    Procedure: COLONOSCOPY, POLYPECTOMY;  Surgeon: Einar Grad, MD;  Location: [facility];  Service: Gastroenterology;  Laterality: N/A;    EGD, BIOPSY N/A 07/21/2020    Procedure: EGD, BIOPSY;  Surgeon: Einar Grad, MD;  Location: [facility];  Service: Gastroenterology;  Laterality: N/A;    EXPLORATORY  LAPAROTOMY N/A 05/22/2021    Procedure: EXPLORATORY LAPAROTOMY;  Surgeon: Delena Serve, MD;  Location: [facility];  Service: General;  Laterality: N/A;    HYSTERECTOMY      LAPAROSCOPY, DIAGNOSTIC N/A 05/22/2021    Procedure: LAPAROSCOPY, DIAGNOSTIC;  Surgeon: Delena Serve, MD;  Location: [facility];  Service: General;  Laterality: N/A;    LAPAROTOMY, COLECTOMY, RIGHT Right 05/22/2021    Procedure: LAPAROTOMY, COLECTOMY, RIGHT;  Surgeon: Delena Serve, MD;  Location: [facility];  Service: General;  Laterality: Right;    MASTECTOMY Right 2015       Social History:  Social History     Socioeconomic History    Marital status: Widowed   Tobacco Use    Smoking status: Never    Smokeless tobacco: Never   Vaping Use    Vaping status: Never Used   Substance and Sexual Activity    Alcohol use: Not Currently     Comment: occ    Drug use: No    Sexual activity: Not Currently       Family History:  Family History   Problem Relation Age of Onset    Breast cancer Neg Hx  Outpatient Medication:  Current Discharge Medication List        CONTINUE these medications which have NOT CHANGED    Details   acetaminophen (TYLENOL) 325 MG tablet Take 2 tablets (650 mg) by mouth every 6 (six) hours as needed for Pain  Qty: 30 tablet, Refills: 0      amLODIPine (NORVASC) 10 MG tablet Take 1 tablet (10 mg total) by mouth daily  Qty: 90 tablet, Refills: 1    Associated Diagnoses: Essential hypertension      anastrozole (ARIMIDEX) 1 MG tablet Take 1 tablet (1 mg total) by mouth every morning  Qty: 30 tablet, Refills: 0    Associated Diagnoses: Malignant neoplasm of overlapping sites of right breast in female, estrogen receptor positive      aspirin 81 MG chewable tablet Chew 1 tablet (81 mg total) by mouth daily  Qty: 90 tablet, Refills: 0      desonide (DESOWEN) 0.05 % ointment Apply topically 2 (two) times daily  Qty: 15 g, Refills: 0      diclofenac Sodium (VOLTAREN) 1 % Gel topical gel Apply 2 g  topically 4 (four) times daily  Qty: 50 g, Refills: 0      ezetimibe (ZETIA) 10 MG tablet Take 1 tablet (10 mg total) by mouth daily  Qty: 90 tablet, Refills: 3    Associated Diagnoses: Pure hypercholesterolemia      Ferrous Sulfate (IRON PO) Take by mouth      gabapentin (NEURONTIN) 100 MG capsule Take 1 capsule (100 mg total) by mouth 2 (two) times daily as needed (Neuropathy)  Qty: 60 capsule, Refills: 0      guaiFENesin (MUCINEX) 600 MG 12 hr tablet Take 2 tablets (1,200 mg total) by mouth 2 (two) times daily  Qty: 14 tablet, Refills: 0      ibuprofen (ADVIL) 600 MG tablet Take 1 tablet (600 mg) by mouth every 6 (six) hours as needed for Pain  Qty: 30 tablet, Refills: 0      levothyroxine (SYNTHROID) 25 MCG tablet Take 1 tablet (25 mcg total) by mouth every morning  Qty: 30 tablet, Refills: 0      lisinopril (ZESTRIL) 40 MG tablet Take 1 tablet (40 mg total) by mouth daily  Qty: 90 tablet, Refills: 1    Associated Diagnoses: Essential hypertension      magnesium oxide (MAG-OX) 400 MG tablet Take 1 tablet (400 mg total) by mouth daily  Qty: 30 tablet, Refills: 0      meclizine (ANTIVERT) 25 MG tablet Take 1 tablet (25 mg) by mouth 3 (three) times daily as needed for Dizziness  Qty: 10 tablet, Refills: 0      Multiple Vitamins-Minerals (MULTIVITAMIN WITH MINERALS) tablet Take 1 tablet by mouth every morning          ondansetron (ZOFRAN-ODT) 4 MG disintegrating tablet Take 1 tablet (4 mg) by mouth every 6 (six) hours as needed for Nausea  Qty: 12 tablet, Refills: 0      pantoprazole (PROTONIX) 40 MG tablet Take 1 tablet (40 mg total) by mouth every morning before breakfast  Qty: 30 tablet, Refills: 0      rosuvastatin (CRESTOR) 40 MG tablet Take 0.5 tablets (20 mg total) by mouth daily  Qty: 90 tablet, Refills: 3    Associated Diagnoses: Essential hypertension; Mixed hyperlipidemia      traMADol (ULTRAM) 50 MG tablet Take 1 tablet (50 mg total) by mouth every 6 (six) hours as needed for Pain  Qty: 30  tablet,  Refills: 0      vitamin B-1 (THIAMINE) 100 MG tablet Take 100 mg by mouth every morning.      Apoaequorin (PREVAGEN PO) Take by mouth daily      mirtazapine (REMERON) 15 MG tablet Take 1 tablet (15 mg total) by mouth nightly  Qty: 30 tablet, Refills: 0               REVIEW OF SYSTEMS     Review of System other than what's stated in HPI is negative. See History of Present Illness.      PHYSICAL EXAM     ED Triage Vitals [01/14/22 1349]   Enc Vitals Group      BP 132/60      Heart Rate 72      Resp Rate 18      Temp 98.4 F (36.9 C)      Temp Source Oral      SpO2 99 %      Weight 69.6 kg      Height 1.6 m      Head Circumference       Peak Flow       Pain Score 0      Pain Loc       Pain Edu?       Excl. in GC?          Constitutional: Resting comfortably no acute distress  Head: Normocephalic, atraumatic.  Eyes: Pupils equal, round,reactive to light.  ZOX:WRUEAENT:Clear oropharynx.  No stridor.  Neck: Supple.  Without meningismus.  Respiratory: Lungs clear to auscultation bilaterally.  No wheeze.  Cardiovascular: Regular rate and rhythm.  No rub.  Abdomen: Soft, non-distended.  Extremities: Well-perfused.  No significant edema.  Skin: Warm and dry.  No significant jaundice.  Neurologic: Alert and oriented.  Normal cranial nerve, sensory and motor exam.  5 out of 5 strength bilaterally.  Although patient is able to ambulate she is feeling dizzy.  Back: Normal on inspection. No midline point tenderness or stepoffs.  Psych: Normal affect, mood congruent    CARDIAC STUDIES      The following cardiac studies were independently interpreted by me the Emergency Medicine Provider.  For full cardiac study results please see chart.    Monitor Strip:  Sinus rhythm at 80 BPM with no ectopy.   EKG Interpretation:  The EKG was reviewed, interpreted and analyzed by me, Dr. Macky LowerAbdullah Jerni Selmer  EKG: Sinus Rhythm at 75 BPM, nonspecific T wave abnormalities, possible left atrial enlargement, no significant ST elevation, no QT prolongation, no  blocks, as interpreted by me. EKG interpretation: Abnormal    EMERGENCY IMAGING STUDIES    The following imagine studies were independently interpreted by me (emergency physician):    RADIOLOGY IMAGING STUDIES      MRI Brain with/without Contrast   Final Result      1.No acute intracranial abnormality.   2.Generalized mild atrophy and scattered white matter changes of   presumed chronic small vessel disease.   3.Partially empty appearance of the sella, similar to prior MRI and   potentially related to generalized volume loss. This finding is often   incidental but can be seen with intracranial hypertension.      Christy GentlesJames Evan Tyler MD, MD   01/14/2022 5:23 PM      CT Perfusion Brain   Final Result       No occlusion or hemodynamically significant stenosis of the major   intracranial arteries.   No  carotid or vertebral stenosis.   Normal CT perfusion.   Multinodular goiter. If not previously performed consider further   evaluation with thyroid ultrasound on nonemergent basis.         Comment:   Assessment for acute infarct, threshold for ischemia tissue volume is   Tmax > 6 s. Threshold for infarct core volume estimated CBF <30 percent.   Threshold for poor collateral flow or severe delayed perfusion is   Tmax>10 sec.      Nelya Ebadirad   01/14/2022 2:53 PM      CTA  Head & Neck   Final Result       No occlusion or hemodynamically significant stenosis of the major   intracranial arteries.   No carotid or vertebral stenosis.   Normal CT perfusion.   Multinodular goiter. If not previously performed consider further   evaluation with thyroid ultrasound on nonemergent basis.         Comment:   Assessment for acute infarct, threshold for ischemia tissue volume is   Tmax > 6 s. Threshold for infarct core volume estimated CBF <30 percent.   Threshold for poor collateral flow or severe delayed perfusion is   Tmax>10 sec.      Nelya Ebadirad   01/14/2022 2:53 PM      CT Head without Contrast   Final Result      No acute  intracranial abnormality. CT can be relatively insensitive for   acute infarct in the first 24-48 hours, and MRI should be considered if   there is high clinical concern.      Christy Gentles MD, MD   01/14/2022 2:29 PM          The following imagine studies were independently interpreted by me (emergency medicine provider):    PULSE OXIMETRY    Oxygen Saturation by Pulse Oximetry: 100%  Interventions: none  Interpretation:  Adequate    EMERGENCY DEPT. MEDICATIONS      ED Medication Orders (From admission, onward)      Start Ordered     Status Ordering Provider    01/14/22 2200 01/14/22 1654  mirtazapine (REMERON) tablet 15 mg  At bedtime        Route: Oral  Ordered Dose: 15 mg     Rainey Pines J    01/14/22 2100 01/14/22 1654  rosuvastatin (CRESTOR) tablet 20 mg  daily at 2100        Route: Oral  Ordered Dose: 20 mg     Rainey Pines J    01/14/22 1730 01/14/22 1654  pantoprazole (PROTONIX) EC tablet 40 mg  Every morning before breakfast        Route: Oral  Ordered Dose: 40 mg     Rainey Pines J    01/14/22 1730 01/14/22 1654  enoxaparin (LOVENOX) syringe 40 mg  Daily        Route: Subcutaneous  Ordered Dose: 40 mg     Rainey Pines J    01/14/22 1654 01/14/22 1654  dextrose (GLUCOSE) 40 % oral gel 15 g of glucose  As needed        Route: Oral  Ordered Dose: 15 g of glucose    See Hyperspace for full Linked Orders Report.    Rainey Pines J    01/14/22 1654 01/14/22 1654  dextrose (D10W) 10% bolus 125 mL  As needed        Route: Intravenous  Ordered Dose: 12.5 g  See Hyperspace for full Linked Orders Report.    Rainey Pines J    01/14/22 1654 01/14/22 1654  dextrose 50 % bolus 12.5 g  As needed        Route: Intravenous  Ordered Dose: 12.5 g    See Hyperspace for full Linked Orders Report.    Rainey Pines J    01/14/22 1654 01/14/22 1654  glucagon (rDNA) (GLUCAGEN) injection 1 mg  As needed        Route: Intramuscular  Ordered Dose: 1 mg    See  Hyperspace for full Linked Orders Report.    Rainey Pines J    01/14/22 1654 01/14/22 1654  ondansetron (ZOFRAN-ODT) disintegrating tablet 4 mg  Every 6 hours PRN        Route: Oral  Ordered Dose: 4 mg    See Hyperspace for full Linked Orders Report.    Rainey Pines J    01/14/22 1654 01/14/22 1654  ondansetron (ZOFRAN) injection 4 mg  Every 6 hours PRN        Route: Intravenous  Ordered Dose: 4 mg    See Hyperspace for full Linked Orders Report.    Rainey Pines J    01/14/22 1654 01/14/22 1654  acetaminophen (TYLENOL) tablet 650 mg  Every 6 hours PRN        Route: Oral  Ordered Dose: 650 mg    See Hyperspace for full Linked Orders Report.    Rainey Pines J    01/14/22 1654 01/14/22 1654  acetaminophen (TYLENOL) suppository 650 mg  Every 6 hours PRN        Route: Rectal  Ordered Dose: 650 mg    See Hyperspace for full Linked Orders Report.    Rainey Pines J    01/14/22 1654 01/14/22 1654  bisacodyl (DULCOLAX) EC tablet 5 mg  Daily PRN        Route: Oral  Ordered Dose: 5 mg     Rainey Pines J    01/14/22 1654 01/14/22 1654  melatonin tablet 3 mg  At bedtime PRN        Route: Oral  Ordered Dose: 3 mg     Rainey Pines J    01/14/22 1654 01/14/22 1654  naloxone (NARCAN) injection 0.2 mg  As needed        Route: Intravenous  Ordered Dose: 0.2 mg     Rainey Pines J    01/14/22 1639 01/14/22 1639  gadoterate meglumine (CLARISCAN) 7.5 MMOL/15ML injection 14 mL  IMG once as needed        Route: Intravenous  Ordered Dose: 0.2 mL/kg     Last MAR action: Imaging Agent Given Sim Boast    01/14/22 1514 01/14/22 1513  meclizine (ANTIVERT) tablet 25 mg  Once        Route: Oral  Ordered Dose: 25 mg     Last MAR action: Hold Tymere Depuy    01/14/22 1413 01/14/22 1414  iohexol (OMNIPAQUE) 350 MG/ML injection 80 mL  IMG once as needed        Route: Intravenous  Ordered Dose: 80 mL     Last MAR action: Imaging Agent Given Burnell Blanks, [facility]    01/14/22 1413  01/14/22 1413  iohexol (OMNIPAQUE) 350 MG/ML injection 40 mL  IMG once as needed        Route: Intravenous  Ordered Dose: 40 mL     Last MAR action: Imaging Agent  Given Macky Lower    01/14/22 1402 01/14/22 1401  sodium chloride 0.9 % bolus 1,000 mL  Once        Route: Intravenous  Ordered Dose: 1,000 mL     Last MAR action: Continue to Inpatient Floor Chace Bisch            LABORATORY RESULTS    Ordered and independently interpreted AVAILABLE laboratory tests. Please see results section in chart for full details.  Results for orders placed or performed during the hospital encounter of 01/14/22   CBC and differential   Result Value Ref Range    WBC 10.55 (H) 3.10 - 9.50 x10 3/uL    Hgb 10.6 (L) 11.4 - 14.8 g/dL    Hematocrit 26.3 78.5 - 43.7 %    Platelets 363 (H) 142 - 346 x10 3/uL    RBC 4.14 3.90 - 5.10 x10 6/uL    MCV 84.1 78.0 - 96.0 fL    MCH 25.6 25.1 - 33.5 pg    MCHC 30.5 (L) 31.5 - 35.8 g/dL    RDW 16 (H) 11 - 15 %    MPV 11.7 8.9 - 12.5 fL    Instrument Absolute Neutrophil Count 7.06 (H) 1.10 - 6.33 x10 3/uL    Neutrophils 66.9 None %    Lymphocytes Automated 22.6 None %    Monocytes 8.5 None %    Eosinophils Automated 1.0 None %    Basophils Automated 0.4 None %    Immature Granulocytes 0.6 None %    Nucleated RBC 0.0 0.0 - 0.0 /100 WBC    Neutrophils Absolute 7.06 (H) 1.10 - 6.33 x10 3/uL    Lymphocytes Absolute Automated 2.38 0.42 - 3.22 x10 3/uL    Monocytes Absolute Automated 0.90 (H) 0.21 - 0.85 x10 3/uL    Eosinophils Absolute Automated 0.11 0.00 - 0.44 x10 3/uL    Basophils Absolute Automated 0.04 0.00 - 0.08 x10 3/uL    Immature Granulocytes Absolute 0.06 0.00 - 0.07 x10 3/uL    Absolute NRBC 0.00 0.00 - 0.00 x10 3/uL   Comprehensive metabolic panel   Result Value Ref Range    Glucose 100 70 - 100 mg/dL    BUN 88.5 7.0 - 02.7 mg/dL    Creatinine 1.3 (H) 0.4 - 1.0 mg/dL    Sodium 741 287 - 867 mEq/L    Potassium 4.3 3.5 - 5.3 mEq/L    Chloride 105 99 - 111 mEq/L    CO2 27 17 - 29 mEq/L     Calcium 9.7 7.9 - 10.2 mg/dL    Protein, Total 7.4 6.0 - 8.3 g/dL    Albumin 4.2 3.5 - 5.0 g/dL    AST (SGOT) 23 5 - 41 U/L    ALT 15 0 - 55 U/L    Alkaline Phosphatase 84 37 - 117 U/L    Bilirubin, Total 0.2 0.2 - 1.2 mg/dL    Globulin 3.2 2.0 - 3.6 g/dL    Albumin/Globulin Ratio 1.3 0.9 - 2.2    Anion Gap 9.0 5.0 - 15.0   Prothrombin time/INR   Result Value Ref Range    PT 11.4 10.1 - 12.9 sec    PT INR 1.0 0.9 - 1.1   APTT   Result Value Ref Range    PTT 38 27 - 39 sec   High Sensitivity Troponin-I at 0 hrs   Result Value Ref Range    hs Troponin-I 2.9 SEE BELOW ng/L   GFR  Result Value Ref Range    EGFR 48.1    Urinalysis Reflex to Microscopic Exam- Reflex to Culture   Result Value Ref Range    Urine Type Urine, Clean Ca     Color, UA Yellow Colorless - Yellow    Clarity, UA Clear Clear - Hazy    Specific Gravity UA 1.020 1.001 - 1.035    Urine pH 7.0 5.0 - 8.0    Leukocyte Esterase, UA Negative Negative    Nitrite, UA Negative Negative    Protein, UR 30 (A) Negative    Glucose, UA Negative Negative    Ketones UA Negative Negative    Urobilinogen, UA Negative 0.2 - 2.0 mg/dL    Bilirubin, UA Negative Negative    Blood, UA Negative Negative    RBC, UA 0 - 2 0 - 5 /hpf    WBC, UA 0 - 5 0 - 5 /hpf   CBC and differential   Result Value Ref Range    WBC 10.56 (H) 3.10 - 9.50 x10 3/uL    Hgb 10.6 (L) 11.4 - 14.8 g/dL    Hematocrit 16.1 09.6 - 43.7 %    Platelets 348 (H) 142 - 346 x10 3/uL    RBC 4.15 3.90 - 5.10 x10 6/uL    MCV 84.1 78.0 - 96.0 fL    MCH 25.5 25.1 - 33.5 pg    MCHC 30.4 (L) 31.5 - 35.8 g/dL    RDW 16 (H) 11 - 15 %    MPV 11.7 8.9 - 12.5 fL    Instrument Absolute Neutrophil Count 7.35 (H) 1.10 - 6.33 x10 3/uL    Neutrophils 69.6 None %    Lymphocytes Automated 22.3 None %    Monocytes 6.4 None %    Eosinophils Automated 0.9 None %    Basophils Automated 0.5 None %    Immature Granulocytes 0.3 None %    Nucleated RBC 0.0 0.0 - 0.0 /100 WBC    Neutrophils Absolute 7.35 (H) 1.10 - 6.33 x10 3/uL     Lymphocytes Absolute Automated 2.35 0.42 - 3.22 x10 3/uL    Monocytes Absolute Automated 0.68 0.21 - 0.85 x10 3/uL    Eosinophils Absolute Automated 0.10 0.00 - 0.44 x10 3/uL    Basophils Absolute Automated 0.05 0.00 - 0.08 x10 3/uL    Immature Granulocytes Absolute 0.03 0.00 - 0.07 x10 3/uL    Absolute NRBC 0.00 0.00 - 0.00 x10 3/uL   Glucose Whole Blood - POCT   Result Value Ref Range    Whole Blood Glucose POCT 102 (H) 70 - 100 mg/dL       PROCEDURES    Procedures        MEDICAL DECISION MAKING     Based on my initial evaluations orders written.      Patient initially treated with:   Medications   meclizine (ANTIVERT) tablet 25 mg (0 mg Oral Hold 01/14/22 1528)   mirtazapine (REMERON) tablet 15 mg (has no administration in time range)   pantoprazole (PROTONIX) EC tablet 40 mg (has no administration in time range)   rosuvastatin (CRESTOR) tablet 20 mg (has no administration in time range)   dextrose (GLUCOSE) 40 % oral gel 15 g of glucose (has no administration in time range)     Or   dextrose (D10W) 10% bolus 125 mL (has no administration in time range)     Or   dextrose 50 % bolus 12.5 g (has no administration in time range)  Or   glucagon (rDNA) (GLUCAGEN) injection 1 mg (has no administration in time range)   ondansetron (ZOFRAN-ODT) disintegrating tablet 4 mg (has no administration in time range)     Or   ondansetron (ZOFRAN) injection 4 mg (has no administration in time range)   acetaminophen (TYLENOL) tablet 650 mg (has no administration in time range)     Or   acetaminophen (TYLENOL) suppository 650 mg (has no administration in time range)   bisacodyl (DULCOLAX) EC tablet 5 mg (has no administration in time range)   melatonin tablet 3 mg (has no administration in time range)   naloxone (NARCAN) injection 0.2 mg (has no administration in time range)   enoxaparin (LOVENOX) syringe 40 mg (has no administration in time range)   aspirin chewable tablet 81 mg (has no administration in time range)     Or    aspirin suppository 300 mg (has no administration in time range)   anastrozole (ARIMIDEX) tablet 1 mg (has no administration in time range)   levothyroxine (SYNTHROID) tablet 25 mcg (has no administration in time range)   lisinopril (ZESTRIL) tablet 40 mg (has no administration in time range)   amLODIPine (NORVASC) tablet 10 mg (has no administration in time range)   sodium chloride 0.9 % bolus 1,000 mL (1,000 mLs Intravenous Continue to Inpatient Floor 01/14/22 1610)   iohexol (OMNIPAQUE) 350 MG/ML injection 40 mL (40 mLs Intravenous Imaging Agent Given 01/14/22 1413)   iohexol (OMNIPAQUE) 350 MG/ML injection 80 mL (80 mLs Intravenous Imaging Agent Given 01/14/22 1414)   gadoterate meglumine (CLARISCAN) 7.5 MMOL/15ML injection 14 mL (14 mLs Intravenous Imaging Agent Given 01/14/22 1640)           PRIMARY PROBLEM LIST      Acute illness/injury with risk to life or bodily function (based on differential diagnosis or evaluation) DIAGNOSIS: Strokelike symptoms, dizziness, difficulty ambulating     Differential Diagnosis: Neuro Deficit: CVA, TIA, intracranial hemorrhage, Peripheral Neuropathy, Cranial Nerve Palsy, demyelinating disease  Each of the differential diagnosis has been considered for diagnosis and weighed risk and benefit of further testing and evaluation in the context of patient complaint. Some diagnosis do not warrant further testing including imaging and/or labs. However, all differential diagnosis have been considered.    DISCUSSION      76 year old female who presents with strokelike symptoms.  Given that patient still is within the 24-hour window.  Code stroke was activated.  Case was discussed with Dr. Francesco Sor, who agrees with management of CT, CT angio, CT perfusion.    Patient is outside the window for tPA.    Results were discussed with patient.  No acute abnormalities found.    Patient was still feeling dizzy upon ambulating.  Patient was treated with Antivert.  MRI brain with and without  ordered.    Given that patient is still symptomatic patient was admitted to the hospital for further evaluation for possible posterior stroke.    ED Course as of 01/14/22 1817   Fri Jan 14, 2022   1515 CT Perfusion Brain [AF]      ED Course User Index  [AF] Macky Lower, MD      NIH Stroke Score      Flowsheet Row Most Recent Value   Patient's calculated Stroke Score: 8 filed at 01/14/2022 1430          Alteplase Contraindications:      Flowsheet Row Most Recent Value   Class III Thrombolytic Exclusion Criteria: do NOT give thrombolytic  Onset time greater than 3 or 4.5 hours Yes   Class IIb Thrombolytic Exclusion Criteria: thrombolytic may be considered only in carefully selected patients with these conditions             The emergency provider has discussed at length with patient regarding symptoms and diagnostics findings, in addition to providing specific details for the plan of care.      Based on the patient continued to remain symptomatic, decision made with patient for admission. Plan for admission discussed with the patient, and all questions and concerns addressed accordingly. Patient states a clear understanding of the plan provided and is comfortable and agreeable.    The emergency provider has spoken with the patient and discussed today's findings, in addition to providing specific details for the plan of care.  Questions are answered and there is agreement with the plan.        If patient is being hospitalized is severe sepsis or septic shock suspected?: N/A  Was management discussed with a consultant?: Yes (explain) Neurologist  External Records Reviewed?: Inpatient Records multiple previous visits with neurology and Dr. Francesco Sor with similar presentation.  Past medical records found on epic reviewed.     CRITICAL CARE: The high probability of sudden, clinically significant deterioration in the patient's condition required the highest level of my preparedness to intervene urgently.    The services I  provided to this patient were to treat and/or prevent clinically significant deterioration that could result in: Stroke.  Services included the following: chart data review, reviewing nursing notes and/or old charts, documentation time, consultant collaboration regarding findings and treatment options, medication orders and management, direct patient care, re-evaluations, vital sign assessments and ordering, interpreting and reviewing diagnostic studies/lab tests.    Aggregate critical care time was 31 minutes, which includes only time during which I was engaged in work directly related to the patient's care, as described above, whether at the bedside or elsewhere in the Emergency Department.  It did not include time spent performing other reported procedures or the services.      Vital Signs: Reviewed the patient's vital signs.   Nursing Notes: Reviewed and utilized available nursing notes.  Medical Records Reviewed: Reviewed available past medical records.  Counseling: The emergency provider has spoken with the patient and discussed today's findings, in addition to providing specific details for the plan of care.  Questions are answered and there is agreement with the plan.        Additional Notes                  MIPS DOCUMENTATION              Prescriptions:  Current Discharge Medication List        CONTINUE these medications which have NOT CHANGED    Details   acetaminophen (TYLENOL) 325 MG tablet Take 2 tablets (650 mg) by mouth every 6 (six) hours as needed for Pain  Qty: 30 tablet, Refills: 0      amLODIPine (NORVASC) 10 MG tablet Take 1 tablet (10 mg total) by mouth daily  Qty: 90 tablet, Refills: 1    Associated Diagnoses: Essential hypertension      anastrozole (ARIMIDEX) 1 MG tablet Take 1 tablet (1 mg total) by mouth every morning  Qty: 30 tablet, Refills: 0    Associated Diagnoses: Malignant neoplasm of overlapping sites of right breast in female, estrogen receptor positive      aspirin 81 MG chewable  tablet Chew  1 tablet (81 mg total) by mouth daily  Qty: 90 tablet, Refills: 0      desonide (DESOWEN) 0.05 % ointment Apply topically 2 (two) times daily  Qty: 15 g, Refills: 0      diclofenac Sodium (VOLTAREN) 1 % Gel topical gel Apply 2 g topically 4 (four) times daily  Qty: 50 g, Refills: 0      ezetimibe (ZETIA) 10 MG tablet Take 1 tablet (10 mg total) by mouth daily  Qty: 90 tablet, Refills: 3    Associated Diagnoses: Pure hypercholesterolemia      Ferrous Sulfate (IRON PO) Take by mouth      gabapentin (NEURONTIN) 100 MG capsule Take 1 capsule (100 mg total) by mouth 2 (two) times daily as needed (Neuropathy)  Qty: 60 capsule, Refills: 0      guaiFENesin (MUCINEX) 600 MG 12 hr tablet Take 2 tablets (1,200 mg total) by mouth 2 (two) times daily  Qty: 14 tablet, Refills: 0      ibuprofen (ADVIL) 600 MG tablet Take 1 tablet (600 mg) by mouth every 6 (six) hours as needed for Pain  Qty: 30 tablet, Refills: 0      levothyroxine (SYNTHROID) 25 MCG tablet Take 1 tablet (25 mcg total) by mouth every morning  Qty: 30 tablet, Refills: 0      lisinopril (ZESTRIL) 40 MG tablet Take 1 tablet (40 mg total) by mouth daily  Qty: 90 tablet, Refills: 1    Associated Diagnoses: Essential hypertension      magnesium oxide (MAG-OX) 400 MG tablet Take 1 tablet (400 mg total) by mouth daily  Qty: 30 tablet, Refills: 0      meclizine (ANTIVERT) 25 MG tablet Take 1 tablet (25 mg) by mouth 3 (three) times daily as needed for Dizziness  Qty: 10 tablet, Refills: 0      Multiple Vitamins-Minerals (MULTIVITAMIN WITH MINERALS) tablet Take 1 tablet by mouth every morning          ondansetron (ZOFRAN-ODT) 4 MG disintegrating tablet Take 1 tablet (4 mg) by mouth every 6 (six) hours as needed for Nausea  Qty: 12 tablet, Refills: 0      pantoprazole (PROTONIX) 40 MG tablet Take 1 tablet (40 mg total) by mouth every morning before breakfast  Qty: 30 tablet, Refills: 0      rosuvastatin (CRESTOR) 40 MG tablet Take 0.5 tablets (20 mg total) by  mouth daily  Qty: 90 tablet, Refills: 3    Associated Diagnoses: Essential hypertension; Mixed hyperlipidemia      traMADol (ULTRAM) 50 MG tablet Take 1 tablet (50 mg total) by mouth every 6 (six) hours as needed for Pain  Qty: 30 tablet, Refills: 0      vitamin B-1 (THIAMINE) 100 MG tablet Take 100 mg by mouth every morning.      Apoaequorin (PREVAGEN PO) Take by mouth daily      mirtazapine (REMERON) 15 MG tablet Take 1 tablet (15 mg total) by mouth nightly  Qty: 30 tablet, Refills: 0             DIAGNOSIS      Diagnosis:  Final diagnoses:   Dizziness   Vision problems   Stroke-like symptoms       Disposition:  ED Disposition       ED Disposition   Admit    Condition   --    Date/Time   Fri Jan 14, 2022 1522    Comment   Admitting Physician: Darlyn Chamber  D Anya.Hailstone   Service:: Medicine [106]   Estimated Length of Stay: > or = to 2 midnights   Tentative Discharge Plan?: Home or Self Care [1]   Does patient need telemetry?: Yes   Is patient 18 yrs or greater?: Yes   Telemetry type (separate Telemetry order is also required):: Adult telemetry                       This note was generated by the Epic EMR system/Dragon speech recognition and may contain inherent errors or omissions not intended by the user. Grammatical errors, random word insertions, deletions, pronoun errors and incomplete sentences are occasional consequences of this technology due to software limitations. Not all errors are caught or corrected. If there are questions or concerns about the content of this note or information contained within the body of this dictation they should be addressed directly with the author for clarification.     Macky Lower, MD  01/14/22 [phone]

## 2022-01-14 NOTE — Consults (Signed)
NEUROLOGY CONSULTATION    Date Time: 01/14/22 4:42 PM  Patient Name: Emily [name]  Attending Physician: Sim Boast, MD      Assessment & Plan:   Numbness with left leg weakness, not sure about etiology this is in association with vertigo symptoms, patient has been admitted multiple times over the last several years, with similar symptoms and negative work-up not sure if this is psychogenic.  We will obtain brain MRI to rule out acute ischemia if this is negative can be discharged from neurology standpoint  .  Follow-up in a.m.        History of Present Illness:   Neurology consultation requested by:-->    76 year old lady presents to the emergency room with facial numbness on the right side and left leg weakness along with vertigo spinning sensation since yesterday 9 PM CT angios CT perfusion studies are negative, she is not a candidate for thrombolytics.                   Past Medical History:     Past Medical History:   Diagnosis Date    Breast cancer 2014    right breast mastectomy    Exercise-induced angina     negative work up    GERD (gastroesophageal reflux disease)     Headache     resolved    Hyperlipidemia     Hypertension     Hypothyroidism     Low back pain     Malignant neoplasm of overlapping sites of right female breast 10/19/2015    Syncope and collapse     Chronic Vertigo    Vertigo        Meds:   Tylenol Norvasc Arimidex aspirin Voltaren Zetia gabapentin Advil Synthroid Zestril Prevagen      Allergies   Allergen Reactions    Morphine Itching    Morphine And Related Itching and Nausea And Vomiting     dizzy       Social & Family History:     Social History     Socioeconomic History    Marital status: Widowed   Tobacco Use    Smoking status: Never    Smokeless tobacco: Never   Vaping Use    Vaping status: Never Used   Substance and Sexual Activity    Alcohol use: Not Currently     Comment: occ    Drug use: No    Sexual activity: Not Currently       Family History   Problem Relation Age  of Onset    Breast cancer Neg Hx            CODE STATUS: Full code  I personally reviewed all of the medications.  Medication list generated using all available resources.  Elder abuse (physical)  - negative  Advanced care plan - reviewed from chart or in discussion with pt or family    Review of Systems:   No headache, eye, ear nose, throat problems; no coughing or wheezing or shortness of breath, No chest pain or orthopnea, no abdominal pain, nausea or vomiting, No pain in the body or extremities, no psychiatric, neurological, endocrine, hematological or cardiac complaints except as noted above.          Physical Exam:   Blood pressure 125/61, pulse 69, temperature 98.4 F (36.9 C), temperature source Oral, resp. rate 22, height 1.6 m (5\' 3" ), weight 69.6 kg (153 lb 7 oz), SpO2 97 %.    HEENT: Normocephalic.no  carotid bruits  Lungs:  CTA bil  Abd Soft   Cardiac:  S1,S2, normal rate and rhythm  Neck: supple, no cartoid bruits  Extremities: no edema  Skin: no rashes seen in exposed areas     Neuro:  Level of consciousness:  Alert and appropriate  Oriented:  X 3  Cognition:  Intact naming, recognition, concentration and following complex commands  Cranial Nerves:  II-XII intact  Strength:  No upper extremity drift, 5/5 strength x 4 extremities  Coordination:  Intact FTN testing  Reflexes:  +2 throughout, down going toes bil  Sensation: Intact x 4 extremities to LT, temp  Labs:     Recent Labs   Lab 01/14/22  1403   Glucose 100   BUN 16.0   Creatinine 1.3*   Calcium 9.7   Sodium 141   Potassium 4.3   Chloride 105   CO2 27   Albumin 4.2   AST (SGOT) 23   ALT 15   Bilirubin, Total 0.2   Alkaline Phosphatase 84     Recent Labs   Lab 01/14/22  1403   WBC 10.55*   Hgb 10.6*   Hematocrit 34.8   MCV 84.1   MCH 25.6   MCHC 30.5*   Platelets 363*         Recent Labs     01/14/22  1403   PTT 38   PT 11.4   PT INR 1.0          Radiology Results (24 Hour)       Procedure Component Value Units Date/Time    MRI Brain with/without  Contrast [[id number]][[id number]] Resulted: 01/14/22 1627    Order Status: Sent Updated: 01/14/22 1627    CTA  Head & Neck [[id number]] Collected: 01/14/22 1431    Order Status: Completed Updated: 01/14/22 1455    Narrative:      CLINICAL INFORMATION: Neuro deficit, left-sided weakness    TECHNIQUE: Brain CT perfusion. CT angiography of the head and neck  including MIP reconstructions. Measurement of cervical carotid stenosis  was performed using NASCET criteria.    All CT scans are performed using one of these three dose reduction  techniques: automated exposure control, adjustment of the mA and/or kV  according to patient size, or use of iterative reconstruction  techniques.    CONTRAST DOSE: 120 mL Omnipaque 350 IV    COMPARISON: CT perfusion and CTA head and neck on 03/08/2021    FINDINGS:     CT PERFUSION:  CBF<30% (core infarct) volume: 0 mL  Tmax>6s (hypoperfused brain) volume: 0 mL. Location: Not applicable  Mismatch volume (Tmax>6s volume - CBF<30% volume): 0 mL  Mismatch ratio (Tmax>6s volume/CBF<30% volume): Not applicable   Hypoperfusion index (Tmax>10s/Tmax>6s): Not applicable  CBV index (rCBV in Tmax>6s): Not applicable    Normal CT perfusion.    CT ANGIOGRAM:  HEAD:   -No stenosis or occlusion of the major intracranial arteries. No  aneurysms are identified.  -Mild narrowing cervical cavernous ICAs bilaterally.    NECK: No carotid or vertebral stenosis.    BRAIN: No mass effect or hydrocephalus.    ORBITS: Right lens replacement.  PARANASAL SINUSES: Clear.  AERODIGESTIVE TRACT: No mass or mass effect involving the pharyngeal  mucosal space or larynx.   SALIVARY GLANDS: The parotid and submandibular glands are normal.   THYROID: Multiple thyroid nodules, largest of these in right thyroid  lobe measures 0.9 x 0.7 cm.Marland Kitchen.  LYMPH NODES: No lymphadenopathy.  BONES: No suspicious osseous lesions.  LUNG APICES: Clear.  OTHER: None.      Impression:         No occlusion or hemodynamically significant stenosis of the  major  intracranial arteries.  No carotid or vertebral stenosis.  Normal CT perfusion.  Multinodular goiter. If not previously performed consider further  evaluation with thyroid ultrasound on nonemergent basis.      Comment:  Assessment for acute infarct, threshold for ischemia tissue volume is  Tmax > 6 s. Threshold for infarct core volume estimated CBF <30 percent.  Threshold for poor collateral flow or severe delayed perfusion is  Tmax>10 sec.    Nelya Ebadirad  01/14/2022 2:53 PM    CT Perfusion Brain [[id number]] Collected: 01/14/22 1431    Order Status: Completed Updated: 01/14/22 1455    Narrative:      CLINICAL INFORMATION: Neuro deficit, left-sided weakness    TECHNIQUE: Brain CT perfusion. CT angiography of the head and neck  including MIP reconstructions. Measurement of cervical carotid stenosis  was performed using NASCET criteria.    All CT scans are performed using one of these three dose reduction  techniques: automated exposure control, adjustment of the mA and/or kV  according to patient size, or use of iterative reconstruction  techniques.    CONTRAST DOSE: 120 mL Omnipaque 350 IV    COMPARISON: CT perfusion and CTA head and neck on 03/08/2021    FINDINGS:     CT PERFUSION:  CBF<30% (core infarct) volume: 0 mL  Tmax>6s (hypoperfused brain) volume: 0 mL. Location: Not applicable  Mismatch volume (Tmax>6s volume - CBF<30% volume): 0 mL  Mismatch ratio (Tmax>6s volume/CBF<30% volume): Not applicable   Hypoperfusion index (Tmax>10s/Tmax>6s): Not applicable  CBV index (rCBV in Tmax>6s): Not applicable    Normal CT perfusion.    CT ANGIOGRAM:  HEAD:   -No stenosis or occlusion of the major intracranial arteries. No  aneurysms are identified.  -Mild narrowing cervical cavernous ICAs bilaterally.    NECK: No carotid or vertebral stenosis.    BRAIN: No mass effect or hydrocephalus.    ORBITS: Right lens replacement.  PARANASAL SINUSES: Clear.  AERODIGESTIVE TRACT: No mass or mass effect involving the  pharyngeal  mucosal space or larynx.   SALIVARY GLANDS: The parotid and submandibular glands are normal.   THYROID: Multiple thyroid nodules, largest of these in right thyroid  lobe measures 0.9 x 0.7 cm.Marland Kitchen  LYMPH NODES: No lymphadenopathy.  BONES: No suspicious osseous lesions.  LUNG APICES: Clear.  OTHER: None.      Impression:         No occlusion or hemodynamically significant stenosis of the major  intracranial arteries.  No carotid or vertebral stenosis.  Normal CT perfusion.  Multinodular goiter. If not previously performed consider further  evaluation with thyroid ultrasound on nonemergent basis.      Comment:  Assessment for acute infarct, threshold for ischemia tissue volume is  Tmax > 6 s. Threshold for infarct core volume estimated CBF <30 percent.  Threshold for poor collateral flow or severe delayed perfusion is  Tmax>10 sec.    Nelya Ebadirad  01/14/2022 2:53 PM    CT Head without Contrast [[id number]] Collected: 01/14/22 1426    Order Status: Completed Updated: 01/14/22 1432    Narrative:      CT HEAD WITHOUT CONTRAST    HISTORY: Stroke.    COMPARISON: Same day noncontrast head CT and CT perfusion study.  Multiple priors, including brain MRI from 03/09/2021 and head CT from  11/22/2021.  TECHNIQUE: Axial CT images were obtained from the skull base to the  vertex without administration of iodinated contrast. Reformatted images  in the coronal and sagittal planes were generated. CT scanning at this  site utilizes multiple dose reduction techniques, including automatic  exposure control, adjustment of the mA and/or kV according to patient  size, and iterative reconstruction technique.      FINDINGS:    Calvarium/skull base: No acute fracture or destructive lesions. Mastoid  air cells clear.    Orbits: Right lens replacement. No masses.    Paranasal sinuses: No air-fluid levels.    Brain: Patchy hypoattenuation in the periventricular and subcortical  white matter, likely from chronic small vessel disease.  Intracranial  arterial calcifications. Generalized brain volume loss. Partially empty  sella. No evidence of acute large vascular territory infarct. No acute  hemorrhage. No hydrocephalus. No mass effect or overt mass lesion.        Impression:        No acute intracranial abnormality. CT can be relatively insensitive for  acute infarct in the first 24-48 hours, and MRI should be considered if  there is high clinical concern.    Christy Gentles MD, MD  01/14/2022 2:29 PM             All recent brain and spine imaging (MRI, CT) personally reviewed.    Chart reviewed    Case discussed with:   minutes; >50% time spent in counseling or coordination of care        This note was generated by the Epic EMR system/Speech recognition and may contain inherent errors or omissions not intended by the user. Grammatical errors, random word insertions, deletions and pronoun errors  are occasional consequences of this technology due to software limitations.   Not all errors are caught or corrected. If there are questions or concerns about the content of this note or information contained within the body of this dictation they should be addressed directly with the author for clarification.    Signed by: Cathe Mons, MD, MD  Spectralink: [phone]      Answering Service: [phone]

## 2022-01-14 NOTE — ED Notes (Signed)
[facility]  ED NURSING NOTE FOR THE RECEIVING INPATIENT NURSE   ED NURSE [place] [phone]   ED CHARGE RN Arlys John   ADMISSION INFORMATION   Emily [name] is a 76 y.o. female admitted with an ED diagnosis of:    1. Dizziness    2. Vision problems    3. Stroke-like symptoms         Isolation: None   Allergies: Morphine and Morphine and related   Holding Orders confirmed? No   Belongings Documented? No   Home medications sent to pharmacy confirmed? N/A   NURSING CARE   Patient Comes From:   Mental Status: Home Independent  alert and oriented   ADL: Independent with all ADLs   Ambulation: 2 person assist   Pertinent Information  and Safety Concerns:     Broset Violence Risk Level: Low Patient is adamant about walking to bathroom. She is very unsteady on her feet citing dizziness and L leg weakness. Patient refuses external cath.     CT / NIH   CT Head ordered on this patient?  Yes   NIH/Dysphagia assessment done prior to admission? Yes   VITAL SIGNS (at the time of this note)      Vitals:    01/14/22 1457   BP: 125/61   Pulse: 69   Resp: 22   Temp:    SpO2: 97%

## 2022-01-14 NOTE — Progress Notes (Signed)
Received Pt from ED via stretcher to room 630-2 with a Dx of Dizziness, vision Problem.Oriented Pt to her room, call light, TV, telephone, bed and bathroom. Denies having any pain/sob at the time. Will give plan of care report to Oncoming RN at the bedside.

## 2022-01-14 NOTE — EDIE (Signed)
COLLECTIVE?NOTIFICATION?01/14/2022 13:48?Natelie, Ostrosky B?MRN: [id number]    [place] [facility]'s patient encounter information:   HXT:?[id number]  Account [id number]  Billing Account [id number]      Criteria Met      5 ED Visits in 12 Months    Security and Safety  No Security Events were found.  ED Care Guidelines  There are currently no ED Care Guidelines for this patient. Please check your facility's medical records system.        Prescription Monitoring Program  000??- Narcotic Use Score  000??- Sedative Use Score  000??- Stimulant Use Score  000??- Overdose Risk Score  - All Scores range from 000-999 with 75% of the population scoring < 200 and on 1% scoring above 650  - The last digit of the narcotic, sedative, and stimulant score indicates the number of active prescriptions of that type  - Higher Use scores correlate with increased prescribers, pharmacies, mg equiv, and overlapping prescriptions  - Higher Overdose Risk Scores correlate with increased risk of unintentional overdose death   Concerning or unexpectedly high scores should prompt a review of the PMP record; this does not constitute checking PMP for prescribing purposes.    E.D. Visit Count (12 mo.)  Facility Visits   [place] [facility] 9   Total 9   Note: Visits indicate total known visits.     Recent Emergency Department Visit Summary  Date Facility [facility] Type Diagnoses or Chief Complaint    Jan 14, 2022  [place] [facility]  [place]  [place]  Emergency      L side numbness      Nov 30, 2021  [place] [facility] [place] [facility]  [place]  [place]  Emergency      Weakness; Fatigue      Fatigue      Dizziness and giddiness      Pulmonary fibrosis, unspecified      Nov 17, 2021  [place] [facility]  [place]  [place]  Emergency      Pain in right side, breast cancer patient      Dysuria      Abdominal Pain      Nausea      Scar Tissue Pain      Calculus of gallbladder without cholecystitis without obstruction      Unspecified  abdominal pain      Nov 11, 2021  [place] [facility]  [place]  [place]  Emergency      chest pain,dizziness,weakness      Syncope      Chest Pain      Paresthesia of skin      Chest pain, unspecified      Acute kidney failure, unspecified      Syncope and collapse      Dizziness and giddiness      Nontoxic single thyroid nodule      Oct 15, 2021  [place] [facility]  [place]  [place]  Emergency      palpitations shortness of breath      Palpitations      Other chest pain      May 22, 2021   - [facility]  [place]  [place]  Emergency      Abd Pain/Weakness      Dizziness      Abdominal Pain      Nausea      Diarrhea      Right lower  quadrant pain      Vascular disorder of intestine, unspecified      Apr 21, [facility]  [place] [facility]  [place]  [place]  Emergency      headache, body aches, leg tingling      Dizziness      Chest wall pain      Chondrocostal junction syndrome [[place]]      Mar 08, 2021  [place] [facility]  [place]  [place]  Emergency      Medic      Altered Mental Status      Altered mental status, unspecified      Mar 04, 2021  [place] [facility]  [place]  [place]  Emergency      cough/body aches      Generalized Body Aches      Myalgia, unspecified site      Sciatica, left side      Contact with and (suspected) exposure to COVID-19      Urinary tract infection, site not specified        Recent Inpatient Visit Summary  Date Facility [facility] Type Diagnoses or Chief Complaint    May 22, 2021  [place] [facility]  [place]  [place]  Medical Surgical      Vascular disorder of intestine, unspecified      Right lower quadrant pain      Estrogen receptor positive status [ER+]      Malignant neoplasm of overlapping sites of right female breast        Care Team  Provider Specialty Phone Fax Service Dates   [place] Clemente[place]HIRI, AMR , DO Internal Medicine   Current    Rondel BatonARTER, STEPHANIE D, M.D. Internal Medicine   Current    DAVIDSON, Janet BerlinGARY M MD Judie PetitM, MD Family Medicine: Adult Medicine [phone] Current    Santa LighterKAY, ARAS F,  M.D. Internal Medicine   Current    Cornelius MorasPATTERSON, JESSICA, NP Nurse Practitioner: Adult Health   Current    Sandria BalesPELAYO-PUGH, LOVERLY GLAZE Nurse Practitioner: Gerontology [phone]  Current      Collective Portal  This patient has registered at the [facility]   For more information visit: [email or web address]     PLEASE NOTE:     1.   Any care recommendations and other clinical information are provided as guidelines or for historical purposes only, and providers should exercise their own clinical judgment when providing care.    2.   You may only use this information for purposes of treatment, payment or health care operations activities, and subject to the limitations of applicable Collective Policies.    3.   You should consult directly with the organization that provided a care guideline or other clinical history with any questions about additional information or accuracy or completeness of information provided.    ? 2023 [employer] - [email or web address]

## 2022-01-14 NOTE — Plan of Care (Signed)
The learning abilities of the patient and/or caregiver have been assessed. Today's individualized plan of care includes NIHSS, neuro checks MRI.  The plan of care was discussed with the patient and/or caregiver, who agrees to it and demonstrates understanding of the disease process, risk factors, treatment plan, medications and consequences of noncompliance. All questions and concerns were addressed.    Problem: Day of Admission - Stroke  Goal: Core/Quality measure requirements - Admission  Outcome: Progressing  Flowsheets (Taken 01/14/2022 2337)  Core/Quality measure requirements - Admission:   Document NIH Stroke Scale on admission   Document nursing swallow/dysphagia screen on admission. If patient fails, keep patient NPO (follow your hospital protocol on swallowing screening).   VTE Prevention: Ensure anticoagulant(s) administered and/or anti-embolism stockings/devices documented as ordered   Ensure antithrombotic administered or contraindication documented by LIP   Ensure lipid panel ordered   Begin stroke education on admission (must include Modifiable Risk Factors, Warning Signs and Symptoms of Stroke, Activation of Emergency Medical System and Follow-up Appointments) Ensure handout has been given and documented.   Ensure PT/OT and/or SLP ordered     Problem: Every Day - Stroke  Goal: Core/Quality measure requirements - Daily  Outcome: Progressing  Flowsheets (Taken 01/14/2022 2337)  Core/Quality measure requirements - Daily:   VTE Prevention: Ensure anticoagulant(s) administered and/or anti-embolism stockings/devices documented by end of day 2   Ensure antithrombotic administered or contraindication documented by LIP by end of day 2   Once lipid panel has resulted, check LDL. Contact provider for statin order if LDL > 70 (or ensure contraindication documented by LIP).   Continue stroke education (must include Modifiable Risk Factors, Warning Signs and Symptoms of Stroke, Activation of Emergency Medical System  and Follow-up Appointments). Ensure handout has been given and documented.  Goal: Neurological status is stable or improving  Outcome: Progressing  Flowsheets (Taken 01/14/2022 2337)  Neurological status is stable or improving:   Re-assess NIH Stroke Scale for any change in status   Monitor/assess/document neurological assessment (Stroke: every 4 hours)   Monitor/assess NIH Stroke Scale   Observe for seizure activity and initiate seizure precautions if indicated   Perform CAM Assessment  Goal: Stable vital signs and fluid balance  Outcome: Progressing  Flowsheets (Taken 01/14/2022 2337)  Stable vital signs and fluid balance:   Position patient for maximum circulation/cardiac output   Monitor intake and output. Notify LIP if urine output is < 30 mL/hour.   Encourage oral fluid intake   Apply telemetry monitor as ordered   Monitor and assess vitals every 4 hours or as ordered and hemodynamic parameters  Goal: Patient will maintain adequate oxygenation  Outcome: Progressing  Flowsheets (Taken 01/14/2022 2337)  Patient will maintain adequate oxygenation:   Suction secretions as needed   Maintain SpO2 of greater than 92%  Goal: Patient's risk of aspiration will be minimized  Outcome: Progressing  Flowsheets (Taken 01/14/2022 2337)  Patient's risk of aspiration will be minimized:   Complete new dysphagia screen for any change in status: Keep patient NPO if patient fails   Keep head of bed up a minimum of 30 degrees when hemodynamically stable   HOB up 90 degrees to eat if unable to be OOB   Instruct patient to take small bites   Place patient up in chair to eat, if possible   Assess and monitor ability to swallow   Supervise patient during oral intake  Goal: Nutritional intake is adequate  Outcome: Progressing  Flowsheets (Taken 01/14/2022 2337)  Nutritional intake  is adequate:   Assist patient with meals/food selection   Allow adequate time for meals   Assess anorexia, appetite, and amount of meal/food tolerated    Encourage/perform oral hygiene as appropriate  Goal: Elimination patterns are normal or improving  Outcome: Progressing  Flowsheets (Taken 01/14/2022 2337)  Elimination patterns are normal or improving:   Report abnormal assessment to physician   Assess for normal bowel sounds   Monitor for abdominal discomfort  Goal: Mobility/Activity is maintained at optimal level for patient  Outcome: Progressing  Flowsheets (Taken 01/14/2022 2337)  Mobility/activity is maintained at optimal level for patient:   Increase mobility as tolerated/progressive mobility   Encourage independent activity per ability   Maintain proper body alignment   Plan activities to conserve energy, plan rest periods   Perform active/passive ROM   Consult/collaborate with Physical Therapy and/or Occupational Therapy  Goal: Skin integrity is maintained or improved  Outcome: Progressing  Flowsheets (Taken 01/14/2022 2337)  Skin integrity is maintained or improved:   Assess Braden Scale every shift   Turn or reposition patient every 2 hours or as needed unless able to reposition self   Increase activity as tolerated/progressive mobility   Relieve pressure to bony prominences   Avoid shearing   Keep skin clean and dry   Keep head of bed 30 degrees or less (unless contraindicated)  Goal: Neurovascular status is stable or improving  Outcome: Progressing  Flowsheets (Taken 01/14/2022 2337)  Neurovascular status is stable or improving:   Monitor/assess neurovascular status (pulses, capillary refill, pain, paresthesia, presence of edema)   Monitor/assess for signs of Venous Thrombus Emboli (edema of calf/thigh redness, pain)   Monitor/assess site of invasive procedure for signs of bleeding  Goal: Effective coping demonstrated  Outcome: Progressing  Flowsheets (Taken 01/14/2022 2337)  Effective coping demonstrated: Assess/report to LIP uncontrolled anxiety, depression, or ineffective coping  Goal: Will be able to express needs and understand communication  Outcome:  Progressing  Flowsheets (Taken 01/14/2022 2337)  Able to express needs and understand communication:   Provide alternative method of communication if needed   Consult/collaborate with Speech Language Pathology (SLP)   Consult/collaborate with Case Management/Social Work   Include patient care companion in decisions related to communication   Patient/patient care companion demonstrates understanding on disease process, treatment plan, medications and discharge plan     Problem: Day of Discharge - Stroke  Goal: Able to express understanding of discharge instructions and stroke education  Outcome: Progressing  Goal: Core/Quality measures - Discharge  Outcome: Progressing     Problem: Safety  Goal: Patient will be free from injury during hospitalization  Outcome: Progressing  Flowsheets (Taken 01/14/2022 2337)  Patient will be free from injury during hospitalization:   Assess patient's risk for falls and implement fall prevention plan of care per policy   Provide and maintain safe environment   Ensure appropriate safety devices are available at the bedside   Use appropriate transfer methods   Include patient/ family/ care giver in decisions related to safety   Hourly rounding   Assess for patients risk for elopement and implement Elopement Risk Plan per policy  Goal: Patient will be free from infection during hospitalization  Outcome: Progressing  Flowsheets (Taken 01/14/2022 2337)  Free from Infection during hospitalization:   Assess and monitor for signs and symptoms of infection   Monitor lab/diagnostic results   Monitor all insertion sites (i.e. indwelling lines, tubes, urinary catheters, and drains)   Encourage patient and family to use good hand  hygiene technique     Problem: Pain  Goal: Pain at adequate level as identified by patient  Outcome: Progressing  Flowsheets (Taken 01/14/2022 2337)  Pain at adequate level as identified by patient:   Identify patient comfort function goal   Assess pain on admission, during  daily assessment and/or before any "as needed" intervention(s)   Reassess pain within 30-60 minutes of any procedure/intervention, per Pain Assessment, Intervention, Reassessment (AIR) Cycle   Evaluate if patient comfort function goal is met   Evaluate patient's satisfaction with pain management progress   Offer non-pharmacological pain management interventions   Include patient/patient care companion in decisions related to pain management as needed     Problem: Discharge Barriers  Goal: Patient will be discharged home or other facility with appropriate resources  Outcome: Progressing  Flowsheets (Taken 01/14/2022 2337)  Discharge to home or other facility with appropriate resources: Provide appropriate patient education     Problem: Psychosocial and Spiritual Needs  Goal: Demonstrates ability to cope with hospitalization/illness  Outcome: Progressing  Flowsheets (Taken 01/14/2022 2337)  Demonstrates ability to cope with hospitalizations/illness:   Encourage verbalization of feelings/concerns/expectations   Provide quiet environment   Assist patient to identify own strengths and abilities   Reinforce positive adaptation of new coping behaviors

## 2022-01-14 NOTE — ED Notes (Signed)
Neurology at bedside.

## 2022-01-15 LAB — PT/INR
PT INR: 1.1 (ref 0.9–1.1)
PT: 12.1 s (ref 10.1–12.9)

## 2022-01-15 LAB — CBC AND DIFFERENTIAL
Absolute NRBC: 0 10*3/uL (ref 0.00–0.00)
Basophils Absolute Automated: 0.06 10*3/uL (ref 0.00–0.08)
Basophils Automated: 0.8 %
Eosinophils Absolute Automated: 0.11 10*3/uL (ref 0.00–0.44)
Eosinophils Automated: 1.4 %
Hematocrit: 33.9 % — ABNORMAL LOW (ref 34.7–43.7)
Hgb: 10.2 g/dL — ABNORMAL LOW (ref 11.4–14.8)
Immature Granulocytes Absolute: 0.03 10*3/uL (ref 0.00–0.07)
Immature Granulocytes: 0.4 %
Instrument Absolute Neutrophil Count: 5.3 10*3/uL (ref 1.10–6.33)
Lymphocytes Absolute Automated: 1.6 10*3/uL (ref 0.42–3.22)
Lymphocytes Automated: 20.4 %
MCH: 25.6 pg (ref 25.1–33.5)
MCHC: 30.1 g/dL — ABNORMAL LOW (ref 31.5–35.8)
MCV: 85 fL (ref 78.0–96.0)
MPV: 11.9 fL (ref 8.9–12.5)
Monocytes Absolute Automated: 0.74 10*3/uL (ref 0.21–0.85)
Monocytes: 9.4 %
Neutrophils Absolute: 5.3 10*3/uL (ref 1.10–6.33)
Neutrophils: 67.6 %
Nucleated RBC: 0 /100 WBC (ref 0.0–0.0)
Platelets: 237 10*3/uL (ref 142–346)
RBC: 3.99 10*6/uL (ref 3.90–5.10)
RDW: 16 % — ABNORMAL HIGH (ref 11–15)
WBC: 7.84 10*3/uL (ref 3.10–9.50)

## 2022-01-15 LAB — HEMOGLOBIN A1C
Average Estimated Glucose: 108.3 mg/dL
Hemoglobin A1C: 5.4 % (ref 4.6–5.9)

## 2022-01-15 LAB — RENAL FUNCTION PANEL
Albumin: 3.7 g/dL (ref 3.5–5.0)
Anion Gap: 9 (ref 5.0–15.0)
BUN: 11 mg/dL (ref 7.0–21.0)
CO2: 27 mEq/L (ref 17–29)
Calcium: 9.3 mg/dL (ref 7.9–10.2)
Chloride: 108 mEq/L (ref 99–111)
Creatinine: 0.9 mg/dL (ref 0.4–1.0)
Glucose: 99 mg/dL (ref 70–100)
Phosphorus: 4 mg/dL (ref 2.3–4.7)
Potassium: 4.6 mEq/L (ref 3.5–5.3)
Sodium: 144 mEq/L (ref 135–145)

## 2022-01-15 LAB — LIPID PANEL
Cholesterol / HDL Ratio: 3.7 Index
Cholesterol: 226 mg/dL — ABNORMAL HIGH (ref 0–199)
HDL: 61 mg/dL (ref 40–9999)
LDL Calculated: 153 mg/dL — ABNORMAL HIGH (ref 0–99)
Triglycerides: 62 mg/dL (ref 34–149)
VLDL Calculated: 12 mg/dL (ref 10–40)

## 2022-01-15 LAB — APTT: PTT: 44 s — ABNORMAL HIGH (ref 27–39)

## 2022-01-15 LAB — GFR: EGFR: >60

## 2022-01-15 MED ORDER — ROSUVASTATIN CALCIUM 10 MG PO TABS
40.0000 mg | ORAL_TABLET | Freq: Every day | ORAL | Status: DC
Start: 2022-01-15 — End: 2022-01-16
  Administered 2022-01-15: 40 mg via ORAL
  Filled 2022-01-15: qty 4

## 2022-01-15 NOTE — PT Eval Note (Addendum)
Physical Therapy Evaluation  Emily [name]      Post Acute Care Therapy Recommendations:     Discharge Recommendations:  Home with supervision, Home with home health PT  D/C Milestones: stairs  Anticipate achievement in 1-2 sessions        DME needs IF patient is discharging home: No additional equipment/DME recommended at this time    Therapy discharge recommendations may change with patient status.  Please refer to most recent note for up-to-date recommendations.           [facility]  [address]  [place], [place] [address]  [phone]    Physical Therapy Evaluation    Patient: Emily [name] MRN: [id number]   Unit: [facility] 4C Bed: M450/M450.01    Time of Treatment: Time Calculation  PT Received On: 01/15/22  Start Time: 1315  Stop Time: 1330  Time Calculation (min): 15 min    Consult received for Emily Ehrich Juste for PT evaluation and treatment.  Patient's medical condition is appropriate for Physical Therapy  intervention at this time.    Interpreter utilized: no, not indicated      Assessment   Emily [name] is a 76 y.o. female admitted 01/14/2022 with with left leg numbness weakness, association with vertigo symptoms, patient has been admitted multiple times over the last several years, with similar symptoms and negative work-up not sure if this is psychogenic.Pmhx of hypertension, hyperlipidemia, GERD, hypothyroidism, low back pain, breast cancer, and chronic vertigo.   At baseline pt lives alone in a second floor Apt. Pt is Indep in ADL's, shopping, cooking and ambulation.  Upon eval, pt received in bed PLOF  Pt indep in bed mob, Indep with all mobility amb 88ft with close supv. Pt presents at very close to baseline, however limited 2/2 dizziness. Pt would benefit from skilled PT services to maximize functional potential in order to return home.           Pt's functional mobility is impacted by:  decreased activity tolerance, decreased  balance, and gait impairment.  There are a few comorbidities or other factors that affect plan of care and require modification of task including: has stairs to manage and lives alone.  Standardized tests and exams incorporated into evaluation include balance, ROM , and Strength.  Pt demonstrates a stable clinical presentation due to precautions that need clarification.   Pt would continue to benefit from PT to address these deficits and increase functional independence.     Complexity Level Hx and Co  morbidites Examination Clinical Decision Making Clinical Presentation   Low  no impact 1-2 elements Limited options Stable       PMP - Progressive Mobility Protocol   PMP Activity: Step 7 - Walks out of Room  Distance Walked (ft) (Step 6,7): 100 Feet       Rehabilitation Potential: good        Interdisciplinary Communication: Nursing made aware of session outcome.       Plan   Cont PT per POC    Plan  Risks/Benefits/POC Discussed with Pt/Family: With patient  Treatment/Interventions: Exercise;Stair training;Neuromuscular re-education;LE strengthening/ROM;Cognitive reorientation;Equipment eval/education;Bed mobility  PT Frequency: 3-4x/wk         Medical Diagnosis: Dizziness [R42]  Vision problems [H54.7]  Stroke-like symptoms [R29.90]    History of Present Illness: Emily [name] is a 76 y.o. female admitted on  01/14/2022 with " PMHx of hypertension, hyperlipidemia, GERD, hypothyroidism, low back pain, breast  cancer, and chronic vertigo presented to the ER with posterior syndrome.  She reports the symptoms started last night around 8:30 PM associated with right-sided facial numbness and left-sided upper and lower extremity subjective weakness.  Patient is also having lightheadedness and dizziness and difficulty ambulating appropriately.  Patient is also complaining of vision problems.  Patient was admitted to rule out stroke.  Patient was seen by neurology.  CT scans negative.  MRIs done await results.  All  neurological testing were normal.  Patient stated she was out of her medication and she has not been taking them for a few days.  She has an appointment with her doctor next week.  So far the only medication she still has is her blood pressure medication and her chemo medication.  She has not been taking her levothyroxine for over a week."       Patient Active Problem List   Diagnosis    Malignant neoplasm of overlapping sites of right female breast    Hypertension    Mixed hyperlipidemia    Hypothyroid    Right sided numbness    Chest discomfort    SOB (shortness of breath)    Left sided numbness    Left-sided weakness    COVID-19 ruled out by laboratory testing    Acute colitis    Dizziness    Right lower quadrant abdominal pain    Abnormal CT scan, gastrointestinal tract    History of hypothyroidism    History of breast cancer    Altered mental status    Hypertensive emergency    History of colonic polyps    AKI (acute kidney injury)       Past Medical History:   Diagnosis Date    Breast cancer 2014    right breast mastectomy    Exercise-induced angina     negative work up    GERD (gastroesophageal reflux disease)     Headache     resolved    Hyperlipidemia     Hypertension     Hypothyroidism     Low back pain     Malignant neoplasm of overlapping sites of right female breast 10/19/2015    Syncope and collapse     Chronic Vertigo    Vertigo        Past Surgical History:   Procedure Laterality Date    COLONOSCOPY  2021    COLONOSCOPY, POLYPECTOMY N/A 07/21/2020    Procedure: COLONOSCOPY, POLYPECTOMY;  Surgeon: Einar Grad, MD;  Location: [facility];  Service: Gastroenterology;  Laterality: N/A;    EGD, BIOPSY N/A 07/21/2020    Procedure: EGD, BIOPSY;  Surgeon: Einar Grad, MD;  Location: [facility];  Service: Gastroenterology;  Laterality: N/A;    EXPLORATORY LAPAROTOMY N/A 05/22/2021    Procedure: EXPLORATORY LAPAROTOMY;  Surgeon: Delena Serve, MD;  Location: [facility];  Service: General;   Laterality: N/A;    HYSTERECTOMY      LAPAROSCOPY, DIAGNOSTIC N/A 05/22/2021    Procedure: LAPAROSCOPY, DIAGNOSTIC;  Surgeon: Delena Serve, MD;  Location: [facility];  Service: General;  Laterality: N/A;    LAPAROTOMY, COLECTOMY, RIGHT Right 05/22/2021    Procedure: LAPAROTOMY, COLECTOMY, RIGHT;  Surgeon: Delena Serve, MD;  Location: [facility];  Service: General;  Laterality: Right;    MASTECTOMY Right 2015       Tests/Labs:  Lab Results   Component Value Date/Time    HGB 10.2 (L) 01/15/2022 04:50 AM  HCT 33.9 (L) 01/15/2022 04:50 AM    K 4.6 01/15/2022 04:50 AM    NA 144 01/15/2022 04:50 AM    INR 1.1 01/15/2022 06:11 AM    TROPI <2.7 01/14/2022 05:35 PM    TROPI Unable toCalc. 01/14/2022 05:35 PM    TROPI 2.9 01/14/2022 02:03 PM    TROPI 2.7 11/11/2021 03:05 PM    TROPI <2.7 10/15/2021 07:03 AM               Imaging   MRI Brain with/without Contrast    Result Date: 01/14/2022  1.No acute intracranial abnormality. 2.Generalized mild atrophy and scattered white matter changes of presumed chronic small vessel disease. 3.Partially empty appearance of the sella, similar to prior MRI and potentially related to generalized volume loss. This finding is often incidental but can be seen with intracranial hypertension. Christy Gentles MD, MD 01/14/2022 5:23 PM    CTA  Head & Neck    Result Date: 01/14/2022   No occlusion or hemodynamically significant stenosis of the major intracranial arteries. No carotid or vertebral stenosis. Normal CT perfusion. Multinodular goiter. If not previously performed consider further evaluation with thyroid ultrasound on nonemergent basis. Comment: Assessment for acute infarct, threshold for ischemia tissue volume is Tmax > 6 s. Threshold for infarct core volume estimated CBF <30 percent. Threshold for poor collateral flow or severe delayed perfusion is Tmax>10 sec. Nelya Ebadirad 01/14/2022 2:53 PM    CT Perfusion Brain    Result Date: 01/14/2022   No occlusion or  hemodynamically significant stenosis of the major intracranial arteries. No carotid or vertebral stenosis. Normal CT perfusion. Multinodular goiter. If not previously performed consider further evaluation with thyroid ultrasound on nonemergent basis. Comment: Assessment for acute infarct, threshold for ischemia tissue volume is Tmax > 6 s. Threshold for infarct core volume estimated CBF <30 percent. Threshold for poor collateral flow or severe delayed perfusion is Tmax>10 sec. Nelya Ebadirad 01/14/2022 2:53 PM    CT Head without Contrast    Result Date: 01/14/2022  No acute intracranial abnormality. CT can be relatively insensitive for acute infarct in the first 24-48 hours, and MRI should be considered if there is high clinical concern. Christy Gentles MD, MD 01/14/2022 2:29 PM     Social History: (Per family/care provider, if patient is not able to give accurate report)  Lives alone in a second floor Apt. Pt is Indep in ADL's, shopping, cooking and ambulation   Entry Steps: 2 FOS   Rails: yes   Equipment at home: Tub/shower, cane   Prior Level of Function:   Cognition: WFL   Mobility/Locomotion: Indep   Feeding: Indep   Grooming: Indep   Bathing: Indep   Dressing: indep   Toileting: Indep    Subjective   " I want to get stronger."  Patient is agreeable to participation in the therapy session. Nursing clears patient for therapy.  Patient's Goal:  "go home."  Pain: pt denies pain    Patient is satisfied with therapist intervention.    Objective     Precautions/ Contraindications:   Precautions  Weight Bearing Status: no restrictions  Other Precautions: falls    Patient is in bed with  telemetry, intravenous access, and bed alarm  in place.       Observation of patient/vitals:        Vitals:    01/16/22 0346 01/16/22 0617 01/16/22 0752 01/16/22 0904   BP: 104/59 109/63 106/65 114/66   Pulse: 62  61  Resp: 18  17    Temp: 99.5 F (37.5 C)  98.8 F (37.1 C)    TempSrc: Oral  Oral    SpO2: 96%  97%    Weight:        Height:           Orientation/Cognition:  Alert and Oriented x 3   Cognition: follows all commands       Musculoskeletal Examination:      ROM Strength                  RLE WFL WFL   LLE [facility] WFL     Sensation: Intact to light touch, localization, proprioception throughout B LE's.     Coordination: WFL    Functional Mobility:  Supine to sit: Indep  Scooting: indep  Sit to Supine: Indep  Sit to stand: Indep  Stand to sit: Indep  Transfers: on/off bed  Ambulation: sup, 100 ft without AD and very slow cadence  close supv    Balance:  Static Sit: good   Dynamic Sit: good   Static Stand:good -  Dynamic Stand: Fair +    Endurance: WFL    Participation:  good     Education:  Educated the patient to role of physical therapy, plan of care, goals  of therapy, rationale for progressing mobility and safety with mobility and ADLs and discharge instructions.    RN notified of session outcome and that patient was left in bed with all needs met and equipment intact.   Safety measures include: handoff to nurse/clin tech/ unit secretary completed.   Mobility and ADL status posted at bedside and within E.M.R.           Goals  Goal Formulation: With patient  Time for Goal Acheivement: By time of discharge  Pt Will Go Supine To Sit: independent;to maximize functional mobility and independence  Pt Will Transfer Bed/Chair: independent;to maximize functional mobility and independence  Pt Will Ambulate: > 200 feet;independent       Modified Rankin Scale:      I am certified in mRS: Yes  mRS Assessment Source: Patient  mRS Value: 2 - Slight disability    Therapist PPE during session procedural mask, N95 mask , and gloves     Signature:  Abbey Chatters, PT  01/16/2022  9:50 AM  Phone: [phone]     (For scheduling questions, please contact rehab tech [phone])

## 2022-01-15 NOTE — Progress Notes (Signed)
Pt alert and oriented x4 with stable vitals. Denies having any pain/sob prior to transferred. Transferred report given to Nurse on Unit 4-C via phone. Nurse verbalized understanding of report at the time. Pt was transferred via bed to room 450-1 in stable condition with all her belongings.

## 2022-01-15 NOTE — Progress Notes (Signed)
Date Time: 01/15/22 2:44 PM  Patient Name: Emily [name]  Attending Physician: Laretta Bolster, MD  Patient Class: Inpatient  Hospital Day: 1            NEUROLOGY PROGRESS NOTE             Assessment/Plan   Numbness with left leg weakness, not sure about cause could be psychogenic brain MRI again negative    Recent Labs   Lab 01/15/22  0450   Cholesterol 226*   Triglycerides 62   HDL 61   LDL Calculated 153*     Recent Labs   Lab 01/15/22  0450   LDL Calculated 153*      Given brain MRI negative okay for discharge from my standpoint  Need to adjust statin dose given extremely high LDL    Subjective:   Patient Seen and Examined.  Back to normal now    Review of Systems:   No headache, eye, ear nose, throat problems; no coughing or wheezing or shortness of breath,   No chest pain or orthopnea, no abdominal pain, nausea or vomiting,   No pain in the body or extremities, no psychiatric, neurological, endocrine, hematological   or cardiac complaints except as noted above.          Physical Exam:   BP 127/65   Pulse (!) 58   Temp 98 F (36.7 C) (Oral)   Resp 19   Ht 1.6 m (5\' 3" )   Wt 66.4 kg (146 lb 6.4 oz)   SpO2 100%   BMI 25.93 kg/m     Neuro:  Level of consciousness: Awake alert follows all commands smile symmetric moving both arms and legs equally  Meds:      Scheduled Meds: PRN Meds:    amLODIPine, 10 mg, Oral, Daily  anastrozole, 1 mg, Oral, QAM  aspirin, 81 mg, Oral, Daily   Or  aspirin, 300 mg, Rectal, Daily  enoxaparin, 40 mg, Subcutaneous, Daily  levothyroxine, 25 mcg, Oral, Daily at 0600  lisinopril, 40 mg, Oral, Daily  meclizine, 25 mg, Oral, Once  mirtazapine, 15 mg, Oral, QHS  pantoprazole, 40 mg, Oral, QAM AC  rosuvastatin, 20 mg, Oral, daily at 2100        Continuous Infusions:   acetaminophen, 650 mg, Q6H PRN   Or  acetaminophen, 650 mg, Q6H PRN  bisacodyl, 5 mg, Daily PRN  dextrose, 15 g of glucose, PRN   Or  dextrose, 12.5 g, PRN   Or  dextrose, 12.5 g, PRN   Or  glucagon (rDNA), 1 mg,  PRN  melatonin, 3 mg, QHS PRN  naloxone, 0.2 mg, PRN  ondansetron, 4 mg, Q6H PRN   Or  ondansetron, 4 mg, Q6H PRN                Labs:     Recent Labs   Lab 01/15/22  0450 01/14/22  1735 01/14/22  1403   Glucose 99 94 100   BUN 11.0 14.0 16.0   Creatinine 0.9 1.1* 1.3*   Calcium 9.3 9.4 9.7   Sodium 144 144 141   Potassium 4.6 4.1 4.3   Chloride 108 107 105   CO2 27 28 27    Albumin 3.7 4.0 4.2   Phosphorus 4.0  --   --    AST (SGOT)  --  20 23   ALT  --  12 15   Bilirubin, Total  --  0.3 0.2   Alkaline Phosphatase  --  84 84     Recent Labs   Lab 01/15/22  0450 01/14/22  1735 01/14/22  1403   WBC 7.84 10.56* 10.55*   Hgb 10.2* 10.6* 10.6*   Hematocrit 33.9* 34.9 34.8   MCV 85.0 84.1 84.1   MCH 25.6 25.5 25.6   MCHC 30.1* 30.4* 30.5*   Platelets 237 348* 363*                 Lab Results   Component Value Date    TSH 0.56 03/08/2021               .ll    Recent Labs     01/15/22  0611 01/14/22  1403   PTT 44* 38   PT 12.1 11.4   PT INR 1.1 1.0          Radiology Results (24 Hour)       Procedure Component Value Units Date/Time    MRI Brain with/without Contrast [[id number]] Collected: 01/14/22 1713    Order Status: Completed Updated: 01/14/22 1726    Narrative:      MRI BRAIN WITHOUT AND WITH CONTRAST    HISTORY: Dizziness and left-sided symptoms. Rule out posterior infarct.    COMPARISON: Same day noncontrast head CT, CTA of the head and neck, and  CT perfusion study. Multiple priors, including brain MRI from  03/09/2021.    TECHNIQUE: Multiplanar, multisequence MRI images of the brain were  obtained before and after intravenous administration of 14 mL Clariscan  gadolinium-based contrast material.      FINDINGS:    Calvarium/skull base: No marrow replacing lesions. Mastoid air cells  clear.    Orbits: Right lens replacement. No masses.      Paranasal sinuses: No air-fluid levels. Mild mucosal thickening in the  ethmoid air cells.    Brain: Scattered small areas of T2 hyperintensity in the periventricular  and  subcortical white matter, likely from chronic small vessel disease.  Generalized brain volume loss. Partially empty sella, similar to prior  MRI. No restricted diffusion/acute infarct. No evidence of acute  intracranial hemorrhage. No hydrocephalus. No abnormal enhancement. No  mass effect. Patent appearance of the major intracranial arteries and  dural venous sinuses.    Other: Upper cervical spondylosis.        Impression:        1.No acute intracranial abnormality.  2.Generalized mild atrophy and scattered white matter changes of  presumed chronic small vessel disease.  3.Partially empty appearance of the sella, similar to prior MRI and  potentially related to generalized volume loss. This finding is often  incidental but can be seen with intracranial hypertension.    Christy Gentles MD, MD  01/14/2022 5:23 PM    CTA  Head & Neck [[id number]] Collected: 01/14/22 1431    Order Status: Completed Updated: 01/14/22 1455    Narrative:      CLINICAL INFORMATION: Neuro deficit, left-sided weakness    TECHNIQUE: Brain CT perfusion. CT angiography of the head and neck  including MIP reconstructions. Measurement of cervical carotid stenosis  was performed using NASCET criteria.    All CT scans are performed using one of these three dose reduction  techniques: automated exposure control, adjustment of the mA and/or kV  according to patient size, or use of iterative reconstruction  techniques.    CONTRAST DOSE: 120 mL Omnipaque 350 IV    COMPARISON: CT perfusion and CTA head and neck on 03/08/2021    FINDINGS:  CT PERFUSION:  CBF<30% (core infarct) volume: 0 mL  Tmax>6s (hypoperfused brain) volume: 0 mL. Location: Not applicable  Mismatch volume (Tmax>6s volume - CBF<30% volume): 0 mL  Mismatch ratio (Tmax>6s volume/CBF<30% volume): Not applicable   Hypoperfusion index (Tmax>10s/Tmax>6s): Not applicable  CBV index (rCBV in Tmax>6s): Not applicable    Normal CT perfusion.    CT ANGIOGRAM:  HEAD:   -No stenosis or occlusion  of the major intracranial arteries. No  aneurysms are identified.  -Mild narrowing cervical cavernous ICAs bilaterally.    NECK: No carotid or vertebral stenosis.    BRAIN: No mass effect or hydrocephalus.    ORBITS: Right lens replacement.  PARANASAL SINUSES: Clear.  AERODIGESTIVE TRACT: No mass or mass effect involving the pharyngeal  mucosal space or larynx.   SALIVARY GLANDS: The parotid and submandibular glands are normal.   THYROID: Multiple thyroid nodules, largest of these in right thyroid  lobe measures 0.9 x 0.7 cm.Marland Kitchen  LYMPH NODES: No lymphadenopathy.  BONES: No suspicious osseous lesions.  LUNG APICES: Clear.  OTHER: None.      Impression:         No occlusion or hemodynamically significant stenosis of the major  intracranial arteries.  No carotid or vertebral stenosis.  Normal CT perfusion.  Multinodular goiter. If not previously performed consider further  evaluation with thyroid ultrasound on nonemergent basis.      Comment:  Assessment for acute infarct, threshold for ischemia tissue volume is  Tmax > 6 s. Threshold for infarct core volume estimated CBF <30 percent.  Threshold for poor collateral flow or severe delayed perfusion is  Tmax>10 sec.    Nelya Ebadirad  01/14/2022 2:53 PM    CT Perfusion Brain [[id number]] Collected: 01/14/22 1431    Order Status: Completed Updated: 01/14/22 1455    Narrative:      CLINICAL INFORMATION: Neuro deficit, left-sided weakness    TECHNIQUE: Brain CT perfusion. CT angiography of the head and neck  including MIP reconstructions. Measurement of cervical carotid stenosis  was performed using NASCET criteria.    All CT scans are performed using one of these three dose reduction  techniques: automated exposure control, adjustment of the mA and/or kV  according to patient size, or use of iterative reconstruction  techniques.    CONTRAST DOSE: 120 mL Omnipaque 350 IV    COMPARISON: CT perfusion and CTA head and neck on 03/08/2021    FINDINGS:     CT PERFUSION:  CBF<30%  (core infarct) volume: 0 mL  Tmax>6s (hypoperfused brain) volume: 0 mL. Location: Not applicable  Mismatch volume (Tmax>6s volume - CBF<30% volume): 0 mL  Mismatch ratio (Tmax>6s volume/CBF<30% volume): Not applicable   Hypoperfusion index (Tmax>10s/Tmax>6s): Not applicable  CBV index (rCBV in Tmax>6s): Not applicable    Normal CT perfusion.    CT ANGIOGRAM:  HEAD:   -No stenosis or occlusion of the major intracranial arteries. No  aneurysms are identified.  -Mild narrowing cervical cavernous ICAs bilaterally.    NECK: No carotid or vertebral stenosis.    BRAIN: No mass effect or hydrocephalus.    ORBITS: Right lens replacement.  PARANASAL SINUSES: Clear.  AERODIGESTIVE TRACT: No mass or mass effect involving the pharyngeal  mucosal space or larynx.   SALIVARY GLANDS: The parotid and submandibular glands are normal.   THYROID: Multiple thyroid nodules, largest of these in right thyroid  lobe measures 0.9 x 0.7 cm.Marland Kitchen  LYMPH NODES: No lymphadenopathy.  BONES: No suspicious osseous lesions.  LUNG APICES: Clear.  OTHER: None.      Impression:         No occlusion or hemodynamically significant stenosis of the major  intracranial arteries.  No carotid or vertebral stenosis.  Normal CT perfusion.  Multinodular goiter. If not previously performed consider further  evaluation with thyroid ultrasound on nonemergent basis.      Comment:  Assessment for acute infarct, threshold for ischemia tissue volume is  Tmax > 6 s. Threshold for infarct core volume estimated CBF <30 percent.  Threshold for poor collateral flow or severe delayed perfusion is  Tmax>10 sec.    Nelya Ebadirad  01/14/2022 2:53 PM             All brain imaging (MRI, CT) personally reviewed.    Case discussed with: pt               I personally reviewed all of the medications.  Medication list generated using all available resources.  Elder abuse (physical)  - negative  Advanced care plan - reviewed from chart or in discussion with pt or family        This note  was generated by the Epic EMR system/ Dragon speech recognition and may contain inherent errors or omissions not intended by the user. Grammatical errors, random word insertions, deletions and pronoun errors  are occasional consequences of this technology due to software limitations.   Not all errors are caught or corrected. If there are questions or concerns about the content of this note or information contained within the body of this dictation they should be addressed directly with the author for clarification.      Signed by: Cathe Mons, MD, MD  Spectralink: [id number]      Answering Service: [phone]

## 2022-01-15 NOTE — UM Notes (Addendum)
ADMISSION ORDER 01/15/22 1659   Place for Observation Services  Once        Diagnosis: Dizziness    Level of Care: Intermediate Care    Patient Class: Observation       References:    [facility] Bed Placement Criteria    [facility] Bed Placement Criteria    [facility] Bed Placement Criteria    [facility] Bed Placement Criteria    [facility] Bed Placement Criteria   Question Answer Comment   Admitting Physician Laretta Bolster    Service: Medicine    Estimated Length of Stay < 2 midnights    Tentative Discharge Plan? Home or Self Care    Does patient need telemetry? Yes    Is patient 18 yrs or greater? Yes    Telemetry type (separate Telemetry order is also required): Adult telemetry       01/14/22 1522   Admit to Inpatient  Once        Diagnosis: Dizziness    Level of Care: Intermediate Care    Patient Class: Inpatient       References:    [facility] Bed Placement Criteria    [facility] Bed Placement Criteria    [facility] Bed Placement Criteria    [facility] Bed Placement Criteria    [facility] Bed Placement Criteria    New Service Definitions Feb 2023   Question Answer Comment   Admitting Physician Darlyn Chamber D    Service: Medicine    Estimated Length of Stay > or = to 2 midnights    Tentative Discharge Plan? Home or Self Care    Does patient need telemetry? Yes    Is patient 18 yrs or greater? Yes    Telemetry type (separate Telemetry order is also required): Adult telemetry          ADMISSION CLASS Observation    PATIENT NAME Emily [name],Emily [name]    PATIENT DATE OF BIRTH 1945-11-16      REASON FOR HOSPITALIZATION   The patient is a(n) 76 y.o. female who arrived to the hospital with complaint of Cerebrovascular Accident    Emily [name] is a 76 y.o. female who presents with dizziness and difficulty ambulating that started since 8 PM yesterday.  Patient reports that she started having right facial symptoms and left hand and lower leg weakness since yesterday evening.  Patient reports that this morning she was having some vision problems and prompted her to come to the  emergency room for further evaluation.      PAST MEDICAL HISTORY   Diagnosis    Breast cancer    right breast mastectomy    Exercise-induced angina    negative work up    GERD (gastroesophageal reflux disease)    Headache    resolved    Hyperlipidemia    Hypertension    Hypothyroidism    Low back pain    Malignant neoplasm of overlapping sites of right female breast    Syncope and collapse    Chronic Vertigo    Vertigo        ADMISSION VITAL SIGNS      BP 132/60      Heart Rate 72      Resp Rate 18      Temp 98.4 F (36.9 C)      Temp Source Oral      SpO2 99 %      Weight 69.6 kg (153 lb 7 oz)      Height 1.6 m (5\' 3" )  RADIOLOGY RESULTS (IF APPLICABLE)   MRI Brain with/without Contrast  No acute intracranial abnormality.   Generalized mild atrophy and scattered white matter changes of presumed chronic small vessel disease.   Partially empty appearance of the sella, similar to prior MRI and potentially related to generalized volume loss. This finding is often incidental but can be seen with intracranial hypertension.     CTA  Head & Neck, CT Perfusion Brain  No occlusion or hemodynamically significant stenosis of the major intracranial arteries. No carotid or vertebral stenosis. Normal CT perfusion. Multinodular goiter. If not previously performed consider further evaluation with thyroid ultrasound on nonemergent basis. Comment: Assessment for acute infarct, threshold for ischemia tissue volume is Tmax > 6 s. Threshold for infarct core volume estimated CBF <30 percent. Threshold for poor collateral flow or severe delayed perfusion is Tmax>10 sec.     CT Head without Contrast  No acute intracranial abnormality. CT can be relatively insensitive for acute infarct in the first 24-48 hours, and MRI should be considered if there is high clinical concern.      ED URINE RESULTS (IF APPLICABLE)    01/14/22 16:07   Urine Type Urine, Clean Ca   Color, UA Yellow   Clarity, UA Clear   Specific Gravity, UA 1.020   Urine  pH 7.0   Leukocyte Esterase, UA Negative   Nitrite, UA Negative   Protein, UR 30 !   Glucose, UA Negative   Ketones UA Negative   Urobilinogen, UA Negative   Bilirubin, UA Negative   Blood, UA Negative   RBC UA 0 - 2   WBC, UA 0 - 5   !: Data is abnormal     LAB RESULTS (IF APPLICABLE)    01/14/22 14:03 01/14/22 17:35   WBC 10.55 (H) 10.56 (H)   Hemoglobin 10.6 (L) 10.6 (L)   Hematocrit 34.8 34.9   Platelet Count 363 (H) 348 (H)   RBC 4.14 4.15   MCV 84.1 84.1   MCH 25.6 25.5   MCHC 30.5 (L) 30.4 (L)   RDW 16 (H) 16 (H)   MPV 11.7 11.7   Instrument Absolute Neutrophil Count 7.06 (H) 7.35 (H)   Neutrophils 66.9 69.6   Lymphocytes Automated 22.6 22.3   Lymphocytes Absolute Automated 2.38 2.35   Monocytes 8.5 6.4   Monocytes Absolute Automated 0.90 (H) 0.68   Eosinophils Automated 1.0 0.9   Eosinophils Absolute Automated 0.11 0.10   Basophils Automated 0.4 0.5   Basophils Absolute Automated 0.04 0.05   Immature Granulocytes 0.6 0.3   Immature Granulocytes Absolute 0.06 0.03   Nucleated RBC 0.0 0.0   Neutrophils Absolute 7.06 (H) 7.35 (H)   Nucleated RBC Absolute 0.00 0.00   Glucose 100 94   BUN 16.0 14.0   Creatinine 1.3 (H) 1.1 (H)   Sodium 141 144   Potassium 4.3 4.1   Chloride 105 107   CO2 27 28   Calcium 9.7 9.4   Anion Gap 9.0 9.0   EGFR 48.1 58.3   AST 23 20   ALT 15 12   Alkaline Phosphatase 84 84   Albumin 4.2 4.0   Protein Total 7.4 6.9   Globulin 3.2 2.9   Albumin/Globulin Ratio 1.3 1.4   Bilirubin Total 0.2 0.3   hs Troponin-I 2.9 <2.7   hs Troponin-I Delta  Unable toCalc.   PT 11.4    PT INR 1.0    PTT 38    (H): Data is abnormally high  (  L): Data is abnormally low  !: Data is abnormal     ED MEDICATIONS    Medications   meclizine (ANTIVERT) tablet 25 mg (0 mg Oral Hold 01/14/22 1528)   mirtazapine (REMERON) tablet 15 mg (15 mg Oral Given 01/14/22 2136)   pantoprazole (PROTONIX) EC tablet 40 mg (40 mg Oral Given 01/15/22 0823)   acetaminophen (TYLENOL) tablet 650 mg (650 mg Oral Given 01/15/22 1245)     Or    acetaminophen (TYLENOL) suppository 650 mg ( Rectal See Alternative 01/15/22 1245)   enoxaparin (LOVENOX) syringe 40 mg (40 mg Subcutaneous Given 01/15/22 0823)   aspirin chewable tablet 81 mg (81 mg Oral Given 01/15/22 0823)     Or   aspirin suppository 300 mg ( Rectal See Alternative 01/15/22 0823)   anastrozole (ARIMIDEX) tablet 1 mg (1 mg Oral Given 01/15/22 0823)   levothyroxine (SYNTHROID) tablet 25 mcg (25 mcg Oral Given 01/15/22 0602)   lisinopril (ZESTRIL) tablet 40 mg (40 mg Oral Given 01/15/22 0823)   amLODIPine (NORVASC) tablet 10 mg (10 mg Oral Given 01/15/22 0602)   sodium chloride 0.9 % bolus 1,000 mL (0 mLs Intravenous Stopped 01/14/22 2136)   iohexol (OMNIPAQUE) 350 MG/ML injection 40 mL (40 mLs Intravenous Imaging Agent Given 01/14/22 1413)   iohexol (OMNIPAQUE) 350 MG/ML injection 80 mL (80 mLs Intravenous Imaging Agent Given 01/14/22 1414)   gadoterate meglumine (CLARISCAN) 7.5 MMOL/15ML injection 14 mL (14 mLs Intravenous Imaging Agent Given 01/14/22 1640)        ADMISSION/TREATMENT PLAN (INCLUDES CONSULTS, PLANNED PROCEDURES)   []  Not available/not resulted at time of review completion.     # posterior Syndromes R/O stroke/TIA:   Numbness and left leg weakness, vertigo, and vision problem.  Symptoms started around 8 or 9 last night.  Patient was admitted to rule out stroke.    Patient received meclizine 25 mg in the ER  Received sodium chloride bolus 1000 mL  - Neurology was consulted.    - Dr. Francesco Sor saw the patient in the ER.    - CT perfusion studies are negative.    Patient is not a candidate for thrombolytic.     - MRI was ordered.    If MRI is negative for acute ischemia patient will be cleared from neurology standpoint.  - Patient is already on aspirin 81 mg   - she took one pill this morning.  - PT/OT/speech evaluation  - Telemetry monitor  - Fall precaution     #Breast cancer:  Home regimen: Anastrozole 1 mg, mirtazapine 15 mg     #Hypertension  #Hyperlipidemia  -Continue home: Amlodipine 10 mg,  lisinopril 40 mg, and rosuvastatin 20 mg  -Continue to monitor BP.     #GERD:  -Continue home med: Pantoprazole 40 mg daily     #Hypothyroidism:  -Check TSH.  -Continue home levothyroxine 25 mcg PO q AM.      DVT prophylaxis: Lovenox 40 mg subcu daily      Neurology Recommendations  Numbness with left leg weakness, not sure about etiology this is in association with vertigo symptoms, patient has been admitted multiple times over the last several years, with similar symptoms and negative work-up not sure if this is psychogenic.  We will obtain brain MRI to rule out acute ischemia if this is negative can be discharged from neurology standpoint  .  Follow-up in a.m.     01/15/2022 - Hospital Day: 2   CURRENT LOCATION: [facility]  VS RANGES - LAST 24 H: [RANGE] CURRENT VS  Temp:  [97.5 F (36.4 C)-98.1 F (36.7 C)] 98 F (36.7 C)  Heart Rate:  [54-68] 58  Resp Rate:  [16-19] 19  BP: (102-137)/(54-75) 127/65 LABS (Abnormal Only):     01/15/22 04:50   Hemoglobin 10.2 (L)   Hematocrit 33.9 (L)   MCHC 30.1 (L)   RDW 16 (H)   Cholesterol 226 (H)   LDL Calculated 153 (H)   (L): Data is abnormally low  (H): Data is abnormally high        IMAGING (EKGs, RADIOLOGY, TESTING, PROCEDURES - IF APPLICABLE)   [x]  Not available/not resulted at time of review completion.           CURRENT MEDICATIONS   Current Facility-Administered Medications   Medication Dose Route Frequency    amLODIPine  10 mg Oral Daily    anastrozole  1 mg Oral QAM    aspirin  81 mg Oral Daily    Or    aspirin  300 mg Rectal Daily    enoxaparin  40 mg Subcutaneous Daily    levothyroxine  25 mcg Oral Daily at 0600    lisinopril  40 mg Oral Daily    meclizine  25 mg Oral Once    mirtazapine  15 mg Oral QHS    pantoprazole  40 mg Oral QAM AC    rosuvastatin  40 mg Oral daily at 2100     Current Facility-Administered Medications   Medication Dose Route    acetaminophen  650 mg Oral    Or    acetaminophen  650 mg Rectal    bisacodyl  5 mg Oral    dextrose   15 g of glucose Oral    Or    dextrose  12.5 g Intravenous    Or    dextrose  12.5 g Intravenous    Or    glucagon (rDNA)  1 mg Intramuscular    melatonin  3 mg Oral    naloxone  0.2 mg Intravenous    ondansetron  4 mg Oral    Or    ondansetron  4 mg Intravenous         CONTINUED PLAN OF CARE/TREATMENT PLAN/CONSULTS   []  Not available/not resulted at time of review completion.   # Posterior Syndrome R/O stroke/TIA:   Numbness and left leg weakness, vertigo, and vision problem.  Symptoms started around 8 or 9 last night.  Patient was admitted to rule out stroke.    Patient received meclizine 25 mg in the ER  Patient received bolus, normal saline  - CT perfusion studies are negative.    -Brain MRI negative for stroke  -Continue aspirin 81 mg   - PT/OT/speech evaluation-cleared  -Patient was seen by neurology-cleared question psychogenic symptoms     #Breast cancer:  Home regimen: Anastrozole 1 mg, mirtazapine 15 mg     #Hypertension  #Hyperlipidemia  -Continue home: Amlodipine 10 mg, lisinopril 40 mg, and rosuvastatin 20 mg  -Patient's LDL is elevated, 150s, increase the dose of statin  -Continue to monitor BP.     #GERD:  -Continue home med: Pantoprazole 40 mg daily     #Hypothyroidism:  -Continue home levothyroxine 25 mcg PO q AM.      DVT prophylaxis: Lovenox 40 mg subcu daily      Neuro Recommendations  Numbness with left leg weakness, not sure about cause could be psychogenic brain MRI again negative  Given brain MRI negative okay for discharge from my standpoint  Need to adjust statin dose given extremely high LDL     UTILIZATION REVIEW CONTACT   Janeece Agee, RN, BSN  Utilization Review     [facility]  Finance - Revenue Cycle    [address]  [address]  [place], [place] [address]  [phone]  [email or web address]  Tax ID/TIN: [id number]      [facility]   ([facility]) [facility]   (IFH) [facility]   [facility]) [facility]   [facility]) [facility]  [facility])   [address]  [place], [place] [address] [address] [place], [place] [address] [address]  [place], [place] [address] [phone] [facility]  [place], [place] [address] [address]  [place], [place] [address]   [phone]  Opt 2 [phone]  Opt 1 [phone]  Opt 3 [phone]  Opt 4 [phone]  Opt 5   Fax: [phone] (All Facilities)   NPI: [id number] NPI: [id number] NPI: [id number] NPI: [id number] NPI: [id number]     **This clinical review is based on/compiled from documentation provided by the treatment team within the patient's medical record.**

## 2022-01-15 NOTE — Progress Notes (Signed)
MEDICINE PROGRESS NOTE    Date Time: 01/15/22 3:27 PM  Patient Name: Emily [name]  Attending Physician: Laretta Bolster, MD    Assessment:     Active Hospital Problems    Diagnosis    Dizziness    Left-sided weakness    Hypothyroid    Mixed hyperlipidemia    Hypertension     76 y.o. female with a PMHx of hypertension, hyperlipidemia, GERD, hypothyroidism, low back pain, breast cancer, and chronic vertigo presented to the ER with posterior syndrome.     Plan:     # Posterior Syndrome R/O stroke/TIA:   Numbness and left leg weakness, vertigo, and vision problem.  Symptoms started around 8 or 9 last night.  Patient was admitted to rule out stroke.    Patient received meclizine 25 mg in the ER  Patient received bolus, normal saline  - CT perfusion studies are negative.    -Brain MRI negative for stroke  -Continue aspirin 81 mg   - PT/OT/speech evaluation-cleared  -Patient was seen by neurology-cleared question psychogenic symptoms     #Breast cancer:  Home regimen: Anastrozole 1 mg, mirtazapine 15 mg     #Hypertension  #Hyperlipidemia  -Continue home: Amlodipine 10 mg, lisinopril 40 mg, and rosuvastatin 20 mg  -Patient's LDL is elevated, 150s, increase the dose of statin  -Continue to monitor BP.     #GERD:  -Continue home med: Pantoprazole 40 mg daily     #Hypothyroidism:  -Continue home levothyroxine 25 mcg PO q AM.      DVT prophylaxis: Lovenox 40 mg subcu daily    We will check orthostatics on patient and if negative patient can go home.    Case discussed with: Patient, RN    Safety Checklist:     DVT prophylaxis:  CHEST guideline (See page [id number]) Chemical and Mechanical   Foley:  [place] Rn Foley protocol Not present   IVs:  Peripheral IV   PT/OT: Ordered   Daily CBC & or Chem ordered:  SHM/ABIM guidelines (see #5) Yes, due to clinical and lab instability       Lines:     Patient Lines/Drains/Airways Status       Active PICC Line / CVC Line / PIV Line / Drain / Airway / Intraosseous Line / Epidural Line / ART  Line / Line / Wound / Pressure Ulcer / NG/OG Tube       Name Placement date Placement time Site Days    Peripheral IV 01/14/22 20 G Left Antecubital 01/14/22  1408  Antecubital  1    Wound 05/23/21 Surgical Incision Abdomen Other (Comment) 05/23/21  0040  Abdomen  237                     Disposition: (Please see PAF column for Expected D/C Date)   Today's date: 01/15/2022  Admit Date: 01/14/2022  2:01 PM  LOS: 1    Subjective     CC: Left-sided weakness    Interval History/24 hour events: Admitted    HPI/Subjective: Patient states that she does not feel well.  Complains of dizziness.    Review of Systems:     As per HPI    Physical Exam:     VITAL SIGNS PHYSICAL EXAM   Temp:  [97.5 F (36.4 C)-98.1 F (36.7 C)] 98 F (36.7 C)  Heart Rate:  [54-68] 58  Resp Rate:  [16-19] 19  BP: (102-137)/(54-75) 127/65  Blood Glucose:  Intake/Output Summary (Last 24 hours) at 01/15/2022 1527  Last data filed at 01/15/2022 0600  Gross per 24 hour   Intake 240 ml   Output --   Net 240 ml    Physical Exam  General: awake, alert X oriented x3  Cardiovascular: regular rate and rhythm, no murmurs, rubs or gallops  Lungs: clear to auscultation bilaterally, without wheezing, rhonchi, or rales  Abdomen: soft, non-tender, non-distended; no palpable masses,  normoactive bowel sounds  Extremities: no edema         Meds:     Medications were reviewed:  Current Facility-Administered Medications   Medication Dose Route Frequency    amLODIPine  10 mg Oral Daily    anastrozole  1 mg Oral QAM    aspirin  81 mg Oral Daily    Or    aspirin  300 mg Rectal Daily    enoxaparin  40 mg Subcutaneous Daily    levothyroxine  25 mcg Oral Daily at 0600    lisinopril  40 mg Oral Daily    meclizine  25 mg Oral Once    mirtazapine  15 mg Oral QHS    pantoprazole  40 mg Oral QAM AC    rosuvastatin  20 mg Oral daily at 2100     Current Facility-Administered Medications   Medication Dose Route Frequency Last Rate     Current Facility-Administered Medications    Medication Dose Route    acetaminophen  650 mg Oral    Or    acetaminophen  650 mg Rectal    bisacodyl  5 mg Oral    dextrose  15 g of glucose Oral    Or    dextrose  12.5 g Intravenous    Or    dextrose  12.5 g Intravenous    Or    glucagon (rDNA)  1 mg Intramuscular    melatonin  3 mg Oral    naloxone  0.2 mg Intravenous    ondansetron  4 mg Oral    Or    ondansetron  4 mg Intravenous         Labs:     Labs (last 72 hours):    Recent Labs   Lab 01/15/22  0450 01/14/22  1735   WBC 7.84 10.56*   Hgb 10.2* 10.6*   Hematocrit 33.9* 34.9   Platelets 237 348*       Recent Labs   Lab 01/15/22  0611 01/14/22  1403   PT 12.1 11.4   PT INR 1.1 1.0   PTT 44* 38    Recent Labs   Lab 01/15/22  0450 01/14/22  1735 01/14/22  1403   Sodium 144 144 141   Potassium 4.6 4.1 4.3   Chloride 108 107 105   CO2 27 28 27    BUN 11.0 14.0 16.0   Creatinine 0.9 1.1* 1.3*   Calcium 9.3 9.4 9.7   Albumin 3.7 4.0 4.2   Protein, Total  --  6.9 7.4   Bilirubin, Total  --  0.3 0.2   Alkaline Phosphatase  --  84 84   ALT  --  12 15   AST (SGOT)  --  20 23   Glucose 99 94 100                   Microbiology, reviewed and are significant for:  Microbiology Results (last 15 days)       Procedure Component Value Units Date/Time  COVID-19 (SARS-CoV-2) only ([facility]) asymptomatic admission [[id number]]     Order Status: Canceled Specimen: Nasopharyngeal             Imaging, reviewed and are significant for:  MRI of brain-no stroke noted/no acute findings      Signed by: Laretta Bolster, MD

## 2022-01-15 NOTE — Progress Notes (Signed)
Nutrition Support Services  [facility]   Main Office Telephone: [phone]    Nutrition Assessment    Deedra EhrichMary Beatrice Bilger 76 y.o. female   MRN: [id number]    Summary of Nutrition Recommendations:   1. Vanilla Ensure HP BID w/ lunch and dinner     2. RN to monitor meal intake and weights    -----------------------------------------------------------------------------------------------------------------    Assessment Data   Reason for Assessment: MST 2 for 5# weight loss x 1 month and decreased appetite    Subjective Nutrition:  Pt met at bedside. She reports having less than normal appetite over the past month. She's not sure why her appetite has changed, but states that she does not have an appetite to eat after cooking meals. She typically snacks throughout the day and drinks 2 Ensures daily. She was eating breakfast at time of visit, about 50% consumed so far. Will send Ensure HP per discussion.     Learning Needs:  Writer discussed the importance of adequate protein and energy intake during visit. She was encouraged to incorporate a high protein food in every meal.     Events of Current Admission:  76 y.o. female with a PMHx of hypertension, hyperlipidemia, GERD, hypothyroidism, low back pain, breast cancer, and chronic vertigo presented to the ER with posterior syndrome.     Medical Hx:  has a past medical history of Breast cancer (2014), Exercise-induced angina, GERD (gastroesophageal reflux disease), Headache, Hyperlipidemia, Hypertension, Hypothyroidism, Low back pain, Malignant neoplasm of overlapping sites of right female breast (10/19/2015), Syncope and collapse, and Vertigo.    PSH: has a past surgical history that includes Mastectomy (Right, 2015); Hysterectomy; EGD, BIOPSY (N/A, 07/21/2020); COLONOSCOPY, POLYPECTOMY (N/A, 07/21/2020); Colonoscopy (2021); LAPAROSCOPY, DIAGNOSTIC (N/A, 05/22/2021); EXPLORATORY LAPAROTOMY (N/A, 05/22/2021); and LAPAROTOMY, COLECTOMY, RIGHT (Right,  05/22/2021).     Weight Hx Summary:   Wt Readings from Last 30 Encounters:   01/14/22 66.4 kg (146 lb 6.4 oz)   12/17/21 67.6 kg (149 lb)   11/11/21 65.8 kg (145 lb)   05/23/21 70 kg (154 lb 5.2 oz)   04/21/21 70 kg (154 lb 5.2 oz)   04/02/21 69.8 kg (153 lb 12.8 oz)   03/31/21 69.3 kg (152 lb 12.8 oz)   03/09/21 69.5 kg (153 lb 3.2 oz)   03/04/21 68 kg (150 lb)   02/23/21 70.7 kg (155 lb 12.8 oz)   01/03/21 70 kg (154 lb 5.2 oz)   11/24/20 72.2 kg (159 lb 2.8 oz)   11/19/20 72.2 kg (159 lb 3.2 oz)   10/20/20 72.6 kg (160 lb)   09/27/20 70 kg (154 lb 6.4 oz)   08/31/20 72.6 kg (160 lb)   08/30/20 69.7 kg (153 lb 10.6 oz)   07/14/20 68.9 kg (152 lb)   06/25/20 70.5 kg (155 lb 6.4 oz)   05/28/20 71.9 kg (158 lb 8.2 oz)   05/12/20 68.9 kg (151 lb 14.4 oz)   05/10/20 64 kg (141 lb 1.5 oz)   03/11/20 71.2 kg (156 lb 15.5 oz)   03/08/20 71.2 kg (156 lb 15.5 oz)   02/27/20 72.1 kg (159 lb)   01/27/20 71.6 kg (157 lb 12.8 oz)   01/27/20 75.7 kg (166 lb 14.2 oz)   12/28/19 71.5 kg (157 lb 10.1 oz)   12/10/19 71.7 kg (158 lb)   08/14/19 68.6 kg (151 lb 3.2 oz)   4.4% weight loss x 11 months (03/09/2021-01/14/2022)- not significant  Documented weights prior to 01/14/22 are limited.  Her weight from 12/17/21 does not include a weight method- question accuracy.       ANTHROPOMETRIC  Height: 160 cm (5\' 3" )  Weight: 66.4 kg (146 lb 6.4 oz)     Weight Change: -4.59              Body mass index is 25.93 kg/m.     Social History     Socioeconomic History    Marital status: Widowed   Tobacco Use    Smoking status: Never    Smokeless tobacco: Never   Vaping Use    Vaping status: Never Used   Substance and Sexual Activity    Alcohol use: Not Currently     Comment: occ    Drug use: No    Sexual activity: Not Currently       There are no active hospital problems to display for this patient.    Allergies   Allergen Reactions    Morphine Itching    Morphine And Related Itching and Nausea And Vomiting     dizzy       GI Symptoms: WDL, per RN  documentation   Skin: Scars, per RN documentation   NFPE:  Head: no overt s/s subcutaneous muscle or fat loss  Upper Body: no overt s/s subcutaneous muscle or fat loss  Lower Body: no s/s subcutaneous fat or muscle loss    Orders Placed This Encounter   Procedures    Diet heart healthy        Current Meds:  amLODIPine, 10 mg, Daily  anastrozole, 1 mg, QAM  aspirin, 81 mg, Daily   Or  aspirin, 300 mg, Daily  enoxaparin, 40 mg, Daily  levothyroxine, 25 mcg, Daily at 0600  lisinopril, 40 mg, Daily  meclizine, 25 mg, Once  mirtazapine, 15 mg, QHS  pantoprazole, 40 mg, QAM AC  rosuvastatin, 20 mg, daily at 2100          Recent Labs:  Recent Labs   Lab 01/15/22  0450 01/14/22  1735 01/14/22  1403   WBC 7.84 10.56* 10.55*   Hgb 10.2* 10.6* 10.6*   Hematocrit 33.9* 34.9 34.8   MCV 85.0 84.1 84.1   Platelets 237 348* 363*     Recent Labs   Lab 01/15/22  0450 01/14/22  1735 01/14/22  1403   Sodium 144 144 141   Potassium 4.6 4.1 4.3   Chloride 108 107 105   CO2 27 28 27    BUN 11.0 14.0 16.0   Creatinine 0.9 1.1* 1.3*   Glucose 99 94 100   Calcium 9.3 9.4 9.7   Phosphorus 4.0  --   --    EGFR >60.0 58.3 48.1     Recent Labs   Lab 01/15/22  0450 01/14/22  1735 01/14/22  1403   Albumin 3.7 4.0 4.2   Labs reviewed    Estimated Nutrition Needs:  Total Daily Energy Needs: 1660 to 1992 kcal  Method for Calculating Energy Needs: 25 kcal - 30 kcal per kg  at 66.4 kg (Actual body weight)  Rationale: BMI 22-27     Total Daily Protein Needs: 66.4 to 79.68 g  Method for Calculating Protein Needs: 1 g - 1.2 g per kg at 66.4 kg (0 body weight)  Rationale: BMI 22-27    Total Daily Fluid Needs: 1660 to 1992 ml  Method for Calculating Fluid Needs: 1 ml per kcal energy = 1660 to 1992 kcal  Rationale: BMI 22-27    Nutrition Diagnosis  Inadequate oral intake related to appetite loss as evidenced by pt reports having poor appetite.    Intervention      Please refer to top of note for nutrition interventions and recommendations     Monitoring  and Evaluation      Goals: 1. Pt will consume at least 50% of meals and ONS by next visit    Monitor:meal intake, meds and labs  Follow up: 5-7 days    Alba Cory, RDN  [phone]

## 2022-01-15 NOTE — OT Eval Note (Signed)
Occupational Therapy Evaluation  Emily [name]        Post Acute Care Therapy Recommendations:     Discharge Recommendations:  Home with supervision      DME needs IF patient is discharging home: No additional equipment/DME recommended at this time    Therapy discharge recommendations may change with patient status.  Please refer to most recent note for up-to-date recommendations.         [facility]  [address]  [place], [place] [address]  [phone]    Occupational Therapy Evaluation    Patient: Emily [name] MRN: [id number]   Unit: [facility] INTERMEDIATE CARE Bed: MI630/MI630-02    Time of treatment: Time Calculation  OT Received On: 01/15/22  Start Time: 1434  Stop Time: 1449  Time Calculation (min): 15 min    Consult received for Emily [name] for OT evaluation and treatment.  Patient's medical condition is appropriate for Occupational Therapy  intervention at this time.    Interpreter utilized: no, not indicated    Assessment     Emily [name] is a 76 y.o. female admitted 01/14/2022. with left leg numbness weakness, association with vertigo symptoms, patient has been admitted multiple times over the last several years, with similar symptoms and negative work-up not sure if this is psychogenic.Pmhx of hypertension, hyperlipidemia, GERD, hypothyroidism, low back pain, breast cancer, and chronic vertigo.   At baseline pt lives alone in a second floor Apt. Pt is Indep in ADL's, shopping, cooking and ambulation.  Today pt received in bed, c/o L LE burning pain and L UE tingling. Pt indep in bed mob, Indep LB dressing at EOB, denied the need for grooming or toileting, amb 150' with supv, able to turn body around to greet and speak with son, no LOB noted. Pt returned to bed, needs at reach, Skilled OT services are not indicated. Recommend d/c home with supv.      Brief chart review completed including review of labs, review of imaging, and review of vitals.      Complexity Chart  Review Performance Deficits Clinical Decision Making Hx/Comorbidities Assistance needed   Low  Brief 1-3 Limited options None None (or at baseline)       PMP - Progressive Mobility Protocol   PMP Activity: Step 7 - Walks out of Room  Distance Walked (ft) (Step 6,7): 150 Feet       Interdisciplinary Communication: RN    Plan     OT Plan  Risks/Benefits/POC Discussed with Pt/Family: With patient  Treatment Interventions: No skilled interventions needed at this time  Discharge Recommendation: Home with supervision  DME Recommended for Discharge: No additional equipment/DME recommended at this time  OT Frequency Recommended: therapy discontinued         Medical Diagnosis: Dizziness [R42]  Vision problems [H54.7]  Stroke-like symptoms [R29.90]    History of Present Illness: Emily [name] is a 76 y.o. female admitted on  01/14/2022 with " PMHx of hypertension, hyperlipidemia, GERD, hypothyroidism, low back pain, breast cancer, and chronic vertigo presented to the ER with posterior syndrome.  She reports the symptoms started last night around 8:30 PM associated with right-sided facial numbness and left-sided upper and lower extremity subjective weakness.  Patient is also having lightheadedness and dizziness and difficulty ambulating appropriately.  Patient is also complaining of vision problems.  Patient was admitted to rule out stroke.  Patient was seen by neurology.  CT scans negative.  MRIs done await results.  All  neurological testing were normal.  Patient stated she was out of her medication and she has not been taking them for a few days.  She has an appointment with her doctor next week.  So far the only medication she still has is her blood pressure medication and her chemo medication.  She has not been taking her levothyroxine for over a week."        Patient Active Problem List   Diagnosis    Malignant neoplasm of overlapping sites of right female breast    Hypertension    Mixed hyperlipidemia     Hypothyroid    Right sided numbness    Chest discomfort    SOB (shortness of breath)    Left sided numbness    Left-sided weakness    COVID-19 ruled out by laboratory testing    Acute colitis    Dizziness    Right lower quadrant abdominal pain    Abnormal CT scan, gastrointestinal tract    History of hypothyroidism    History of breast cancer    Altered mental status    Hypertensive emergency    History of colonic polyps    AKI (acute kidney injury)       Past Medical History:   Diagnosis Date    Breast cancer 2014    right breast mastectomy    Exercise-induced angina     negative work up    GERD (gastroesophageal reflux disease)     Headache     resolved    Hyperlipidemia     Hypertension     Hypothyroidism     Low back pain     Malignant neoplasm of overlapping sites of right female breast 10/19/2015    Syncope and collapse     Chronic Vertigo    Vertigo        Past Surgical History:   Procedure Laterality Date    COLONOSCOPY  2021    COLONOSCOPY, POLYPECTOMY N/A 07/21/2020    Procedure: COLONOSCOPY, POLYPECTOMY;  Surgeon: Einar Grad, MD;  Location: [facility];  Service: Gastroenterology;  Laterality: N/A;    EGD, BIOPSY N/A 07/21/2020    Procedure: EGD, BIOPSY;  Surgeon: Einar Grad, MD;  Location: [facility];  Service: Gastroenterology;  Laterality: N/A;    EXPLORATORY LAPAROTOMY N/A 05/22/2021    Procedure: EXPLORATORY LAPAROTOMY;  Surgeon: Delena Serve, MD;  Location: [facility];  Service: General;  Laterality: N/A;    HYSTERECTOMY      LAPAROSCOPY, DIAGNOSTIC N/A 05/22/2021    Procedure: LAPAROSCOPY, DIAGNOSTIC;  Surgeon: Delena Serve, MD;  Location: [facility];  Service: General;  Laterality: N/A;    LAPAROTOMY, COLECTOMY, RIGHT Right 05/22/2021    Procedure: LAPAROTOMY, COLECTOMY, RIGHT;  Surgeon: Delena Serve, MD;  Location: [facility];  Service: General;  Laterality: Right;    MASTECTOMY Right 2015       Tests/Labs:  Lab Results   Component Value Date/Time    HGB  10.2 (L) 01/15/2022 04:50 AM    HCT 33.9 (L) 01/15/2022 04:50 AM    K 4.6 01/15/2022 04:50 AM    NA 144 01/15/2022 04:50 AM    INR 1.1 01/15/2022 06:11 AM    TROPI <2.7 01/14/2022 05:35 PM    TROPI Unable toCalc. 01/14/2022 05:35 PM    TROPI 2.9 01/14/2022 02:03 PM    TROPI 2.7 11/11/2021 03:05 PM    TROPI <2.7 10/15/2021 07:03 AM    TROPI 0.01  07/15/2009 09:40 AM         Imaging:  MRI Brain with/without Contrast    Result Date: 01/14/2022  1.No acute intracranial abnormality. 2.Generalized mild atrophy and scattered white matter changes of presumed chronic small vessel disease. 3.Partially empty appearance of the sella, similar to prior MRI and potentially related to generalized volume loss. This finding is often incidental but can be seen with intracranial hypertension. Christy GentlesJames Evan Tyler MD, MD 01/14/2022 5:23 PM    CTA  Head & Neck    Result Date: 01/14/2022   No occlusion or hemodynamically significant stenosis of the major intracranial arteries. No carotid or vertebral stenosis. Normal CT perfusion. Multinodular goiter. If not previously performed consider further evaluation with thyroid ultrasound on nonemergent basis. Comment: Assessment for acute infarct, threshold for ischemia tissue volume is Tmax > 6 s. Threshold for infarct core volume estimated CBF <30 percent. Threshold for poor collateral flow or severe delayed perfusion is Tmax>10 sec. Nelya Ebadirad 01/14/2022 2:53 PM    CT Perfusion Brain    Result Date: 01/14/2022   No occlusion or hemodynamically significant stenosis of the major intracranial arteries. No carotid or vertebral stenosis. Normal CT perfusion. Multinodular goiter. If not previously performed consider further evaluation with thyroid ultrasound on nonemergent basis. Comment: Assessment for acute infarct, threshold for ischemia tissue volume is Tmax > 6 s. Threshold for infarct core volume estimated CBF <30 percent. Threshold for poor collateral flow or severe delayed perfusion is Tmax>10  sec. Nelya Ebadirad 01/14/2022 2:53 PM    CT Head without Contrast    Result Date: 01/14/2022  No acute intracranial abnormality. CT can be relatively insensitive for acute infarct in the first 24-48 hours, and MRI should be considered if there is high clinical concern. Christy GentlesJames Evan Tyler MD, MD 01/14/2022 2:29 PM      Social History: (Per family/care provider, if patient is not able to give accurate report)  Lives alone in a second floor Apt. Pt is Indep in ADL's, shopping, cooking and ambulation   Entry Steps: 2 FOS   Rails: yes   Equipment at home: Tub/shower, cane   Prior Level of Function:   Cognition: WFL   Mobility/Locomotion: Indep   Feeding: Indep   Grooming: Indep   Bathing: Indep   Dressing: indep   Toileting: Indep    Subjective   My L leg hurts has burning pain.   Patient is agreeable to participation in the therapy session. Nursing clears patient for therapy.  Patient's Goal:  stay independent  Pain: Pt unable to quantify  Location: L LE  Therapist Intervention: positioned for comfort  Patient is satisfied with therapist intervention.    Objective     Precautions:   Precautions  Weight Bearing Status: no restrictions  Other Precautions: falls    Patient is in bed with telemetry, intravenous access, and bed alarm in place.       Observation of patient/vitals:   Vitals:    01/15/22 0424 01/15/22 0559 01/15/22 0806 01/15/22 1200   BP: 102/58 137/75 108/65 127/65   Pulse: (!) 55 (!) 54 (!) 55 (!) 58   Resp:   18 19   Temp: 97.9 F (36.6 C)  98.1 F (36.7 C) 98 F (36.7 C)   TempSrc: Oral  Oral Oral   SpO2: 98%  99% 100%   Weight:       Height:           Orientation/Cognition:     Alert and Oriented x  3  Cognition: WFL      Musculoskeletal Examination:     ROM Strength   RUE WFL WFL   LUE WFL WFL         Sensation: R UE: Intact to light touch, denies numbness/tingling throughout BUE                       L UE: reports tingling   Coordination: Intact gross motor and serial opposition to B hands    Vision: WFL,  glasses   Hearing: WFL      Functional Mobility:   Supine to sit: Indep  Scooting: indep  Sit to Supine: Indep  Sit to stand: Indep  Stand to sit: Indep  Transfers: on/off bed  Ambulation: supv 150', (had 2 standing rest breaks few seconds each due to pain)    Balance:  Static Sit Balance: good  Dynamic Sit Balance: good  Static Stand Balance: good  Dynamic Stand Balance: good    Self Care:  Grooming: denied need  LB Dressing: Indep don socks at EOB  Toileting: denied need      Endurance: fair    Participation:  good    Education:  Educated the Personal assistant to role of occupational therapy, plan of care, goals  of therapy, rationale for progressing mobility and safety with mobility and ADLs.    RN notified of session outcome and that patient was left in bed with all needs met and equipment intact.   Safety measures include: handoff to nurse/clin tech/ unit secretary completed, bed alarm activated, personal items within reach, assistive device positioned out of reach, bed placed in lowest position, and family/caregiver at bedside.   Mobility and ADL status posted at bedside and within E.M.R.                Goals:  Goals  Goals:  (no goals indicated)    Modified Rankin Scale   I am certified in mRS: Yes  mRS Assessment Source: Patient  mRS Value: 1 - No significant disability    Therapist PPE during session procedural mask and gloves      Signature:   Kathlene November, OT  01/15/2022  3:11 PM      (For scheduling questions, please contact rehab tech [phone])

## 2022-01-15 NOTE — Plan of Care (Signed)
Problem: Every Day - Stroke  Goal: Core/Quality measure requirements - Daily  Outcome: Progressing  Flowsheets (Taken 01/15/2022 1152)  Core/Quality measure requirements - Daily:   Ensure antithrombotic administered or contraindication documented by LIP by end of day 2   Continue stroke education (must include Modifiable Risk Factors, Warning Signs and Symptoms of Stroke, Activation of Emergency Medical System and Follow-up Appointments). Ensure handout has been given and documented.  Goal: Neurological status is stable or improving  Outcome: Progressing  Flowsheets (Taken 01/15/2022 1152)  Neurological status is stable or improving:   Monitor/assess/document neurological assessment (Stroke: every 4 hours)   Perform CAM Assessment   Observe for seizure activity and initiate seizure precautions if indicated  Goal: Stable vital signs and fluid balance  Outcome: Progressing  Flowsheets (Taken 01/15/2022 1152)  Stable vital signs and fluid balance:   Position patient for maximum circulation/cardiac output   Monitor and assess vitals every 4 hours or as ordered and hemodynamic parameters   Monitor intake and output. Notify LIP if urine output is < 30 mL/hour.   Encourage oral fluid intake   Apply telemetry monitor as ordered  Goal: Patient's risk of aspiration will be minimized  Outcome: Progressing  Flowsheets (Taken 01/15/2022 1152)  Patient's risk of aspiration will be minimized:   Order modified texture diet as recommend by Speech Pathologist   HOB up 90 degrees to eat if unable to be OOB   Place patient up in chair to eat, if possible   Keep head of bed up a minimum of 30 degrees when hemodynamically stable   Assess and monitor ability to swallow   Instruct patient to take small single sips of liquid   Instruct patient to take small bites  Goal: Nutritional intake is adequate  Outcome: Progressing  Flowsheets (Taken 01/15/2022 1152)  Nutritional intake is adequate:   Monitor daily weights   Assist patient with  meals/food selection   Allow adequate time for meals   Encourage/perform oral hygiene as appropriate   Include patient/patient care companion in decisions related to nutrition  Goal: Elimination patterns are normal or improving  Outcome: Progressing  Flowsheets (Taken 01/15/2022 1152)  Elimination patterns are normal or improving:   Report abnormal assessment to physician   Assess for normal bowel sounds   Monitor for abdominal discomfort  Goal: Mobility/Activity is maintained at optimal level for patient  Outcome: Progressing  Flowsheets (Taken 01/15/2022 1152)  Mobility/activity is maintained at optimal level for patient:   Increase mobility as tolerated/progressive mobility   Encourage independent activity per ability   Maintain proper body alignment   Plan activities to conserve energy, plan rest periods   Perform active/passive ROM   Reposition patient every 2 hours and as needed unless able to reposition self   Assess for changes in respiratory status, level of consciousness and/or development of fatigue  Goal: Skin integrity is maintained or improved  Outcome: Progressing  Flowsheets (Taken 01/15/2022 1152)  Skin integrity is maintained or improved:   Assess Braden Scale every shift   Turn or reposition patient every 2 hours or as needed unless able to reposition self   Increase activity as tolerated/progressive mobility   Relieve pressure to bony prominences   Encourage use of lotion/moisturizer on skin   Keep skin clean and dry   Monitor patient's hygiene practices   Avoid shearing  Goal: Neurovascular status is stable or improving  Outcome: Progressing  Flowsheets (Taken 01/15/2022 1152)  Neurovascular status is stable or improving:  Monitor/assess neurovascular status (pulses, capillary refill, pain, paresthesia, presence of edema)   Monitor/assess site of invasive procedure for signs of bleeding  Goal: Effective coping demonstrated  Outcome: Progressing  Flowsheets (Taken 01/15/2022 1152)  Effective coping  demonstrated:   Assess/report to LIP uncontrolled anxiety, depression, or ineffective coping   Offer reassurance to decrease anxiety  Goal: Will be able to express needs and understand communication  Outcome: Progressing  Flowsheets (Taken 01/15/2022 1152)  Able to express needs and understand communication:   Provide alternative method of communication if needed   Include patient care companion in decisions related to communication   Patient/patient care companion demonstrates understanding on disease process, treatment plan, medications and discharge plan     Problem: Safety  Goal: Patient will be free from injury during hospitalization  Outcome: Progressing  Flowsheets (Taken 01/15/2022 1152)  Patient will be free from injury during hospitalization:   Assess patient's risk for falls and implement fall prevention plan of care per policy   Use appropriate transfer methods   Provide and maintain safe environment   Ensure appropriate safety devices are available at the bedside   Hourly rounding   Include patient/ family/ care giver in decisions related to safety  Goal: Patient will be free from infection during hospitalization  Outcome: Progressing  Flowsheets (Taken 01/15/2022 1152)  Free from Infection during hospitalization:   Assess and monitor for signs and symptoms of infection   Monitor lab/diagnostic results   Encourage patient and family to use good hand hygiene technique     Problem: Pain  Goal: Pain at adequate level as identified by patient  Outcome: Progressing  Flowsheets (Taken 01/15/2022 1152)  Pain at adequate level as identified by patient:   Identify patient comfort function goal   Assess for risk of opioid induced respiratory depression, including snoring/sleep apnea. Alert healthcare team of risk factors identified.   Assess pain on admission, during daily assessment and/or before any "as needed" intervention(s)   Evaluate if patient comfort function goal is met   Include patient/patient care  companion in decisions related to pain management as needed

## 2022-01-16 DIAGNOSIS — E782 Mixed hyperlipidemia: Secondary | ICD-10-CM

## 2022-01-16 DIAGNOSIS — R531 Weakness: Secondary | ICD-10-CM

## 2022-01-16 LAB — ECG 12-LEAD
Atrial Rate: 75 {beats}/min
IHS MUSE NARRATIVE AND IMPRESSION: NORMAL
P Axis: 56 degrees
P-R Interval: 188 ms
Q-T Interval: 370 ms
QRS Duration: 88 ms
QTC Calculation (Bezet): 413 ms
R Axis: 38 degrees
T Axis: 19 degrees
Ventricular Rate: 75 {beats}/min

## 2022-01-16 LAB — RENAL FUNCTION PANEL
Albumin: 3.3 g/dL — ABNORMAL LOW (ref 3.5–5.0)
Anion Gap: 7 (ref 5.0–15.0)
BUN: 13 mg/dL (ref 7.0–21.0)
CO2: 29 mEq/L (ref 17–29)
Calcium: 9 mg/dL (ref 7.9–10.2)
Chloride: 105 mEq/L (ref 99–111)
Creatinine: 1.2 mg/dL — ABNORMAL HIGH (ref 0.4–1.0)
Glucose: 111 mg/dL — ABNORMAL HIGH (ref 70–100)
Phosphorus: 3.7 mg/dL (ref 2.3–4.7)
Potassium: 4.7 mEq/L (ref 3.5–5.3)
Sodium: 141 mEq/L (ref 135–145)

## 2022-01-16 LAB — CBC
Absolute NRBC: 0 10*3/uL (ref 0.00–0.00)
Hematocrit: 30.3 % — ABNORMAL LOW (ref 34.7–43.7)
Hgb: 9.4 g/dL — ABNORMAL LOW (ref 11.4–14.8)
MCH: 25.8 pg (ref 25.1–33.5)
MCHC: 31 g/dL — ABNORMAL LOW (ref 31.5–35.8)
MCV: 83.2 fL (ref 78.0–96.0)
MPV: 11.3 fL (ref 8.9–12.5)
Nucleated RBC: 0 /100 WBC (ref 0.0–0.0)
Platelets: 325 10*3/uL (ref 142–346)
RBC: 3.64 10*6/uL — ABNORMAL LOW (ref 3.90–5.10)
RDW: 16 % — ABNORMAL HIGH (ref 11–15)
WBC: 8.37 10*3/uL (ref 3.10–9.50)

## 2022-01-16 LAB — GFR: EGFR: 52.8

## 2022-01-16 MED ORDER — ROSUVASTATIN CALCIUM 40 MG PO TABS
40.0000 mg | ORAL_TABLET | Freq: Every day | ORAL | 0 refills | Status: DC
Start: 2022-01-16 — End: 2022-05-13

## 2022-01-16 NOTE — Plan of Care (Signed)
4 eyes in 4 hours pressure injury assessment note:      RN or CNA Completed with: [facility]           Bony Prominences: Check appropriate box below     If wound is present add wound to LDA avatar      Occiput:   [x]  WNL   []  Wound present    Face:                     [x]  WNL    []  Wound present    Ears:      [x]  WNL   []  Wound present    Spine:    [x]  WNL   []  Wound present    Shoulders:    [x]  WNL    []  Wound present    Elbows:    [x]  WNL    []  Wound present    Sacrum/coccyx:   [x]  WNL    []  Wound present    Ischial Tuberosity:  [x]  WNL  []  Wound present    Trochanter/Hip:       [x]  WNL  []  Wound present    Knees:     [x]  WNL  []  Wound present    Ankles:     [x]  WNL  []  Wound present    Heels:    [x]  WNL  []  Wound present      Other pressure areas:      Wound Location:      Device related: []  Device:       Consult WOCN for guidance & staging of pressure injuries.          Problem: Safety  Goal: Patient will be free from injury during hospitalization  Outcome: Progressing  Flowsheets (Taken 01/16/2022 0049)  Patient will be free from injury during hospitalization:   Assess patient's risk for falls and implement fall prevention plan of care per policy   Provide and maintain safe environment   Ensure appropriate safety devices are available at the bedside   Use appropriate transfer methods   Hourly rounding     Problem: Pain  Goal: Pain at adequate level as identified by patient  Outcome: Adequate for Discharge  Flowsheets (Taken 01/16/2022 0049)  Pain at adequate level as identified by patient:   Identify patient comfort function goal   Assess pain on admission, during daily assessment and/or before any "as needed" intervention(s)   Reassess pain within 30-60 minutes of any procedure/intervention, per Pain Assessment, Intervention, Reassessment (AIR) Cycle     Problem: Every Day - Stroke  Goal: Neurological status is stable or improving  Outcome: Progressing  Flowsheets (Taken 01/16/2022 0049)  Neurological  status is stable or improving:   Monitor/assess/document neurological assessment (Stroke: every 4 hours)   Observe for seizure activity and initiate seizure precautions if indicated   Perform CAM Assessment  Goal: Mobility/Activity is maintained at optimal level for patient  Outcome: Progressing  Flowsheets (Taken 01/16/2022 0049)  Mobility/activity is maintained at optimal level for patient:   Increase mobility as tolerated/progressive mobility   Encourage independent activity per ability   Maintain proper body alignment   Perform active/passive ROM   Plan activities to conserve energy, plan rest periods   Consult/collaborate with Physical Therapy and/or Occupational Therapy     Problem: Neurological Deficit  Goal: Neurological status is stable or improving  Outcome: Progressing  Flowsheets (Taken 01/16/2022 0049)  Neurological status is stable or improving:   Monitor/assess/document neurological  assessment (Stroke: every 4 hours)   Observe for seizure activity and initiate seizure precautions if indicated   Perform CAM Assessment     Problem: Impaired Mobility  Goal: Mobility/Activity is maintained at optimal level for patient  Outcome: Progressing  Flowsheets (Taken 01/16/2022 0049)  Mobility/activity is maintained at optimal level for patient:   Increase mobility as tolerated/progressive mobility   Encourage independent activity per ability   Maintain proper body alignment   Perform active/passive ROM   Plan activities to conserve energy, plan rest periods   Consult/collaborate with Physical Therapy and/or Occupational Therapy

## 2022-01-16 NOTE — Discharge Instr - AVS First Page (Signed)
Reason for your Hospital Admission:  Left leg weakness, numbness      Instructions for after your discharge:  You did not have a stroke. Your MRI was negative.  Your Crestor dose has been increased.

## 2022-01-16 NOTE — Discharge Summary (Signed)
DISCHARGE SUMMARY/PROGRESS NOTE  [facility], DIVISION OF HOSPITALIST MEDICINE    [facility]      Date Time: 01/16/22 12:41 PM  Patient Name: Emily [name]  Attending Physician: Laretta Bolster, MD  Primary Care Physician: Rondel Baton, MD    Date of Admission: 01/14/2022  Date of Discharge: 01/16/2022    Outcome of Hospitalization:   Principal Problem (Resolved):    Dizziness  Active Problems:    Hypertension    Mixed hyperlipidemia    Hypothyroid  Resolved Problems:    Left-sided weakness     Significant Events:   76 y.o. female with a PMHx of hypertension, hyperlipidemia, GERD, hypothyroidism, low back pain, breast cancer, and chronic vertigo presented to the ER with posterior syndrome.   # Posterior Syndrome R/O stroke/TIA:   Numbness and left leg weakness, vertigo, and vision problem.  Symptoms started around 8 or 9 last night.  Patient was admitted to rule out stroke.    Patient received meclizine 25 mg in the ER  Patient received bolus, normal saline  - CT perfusion studies are negative.    -Brain MRI negative for stroke  -Continue aspirin 81 mg   - PT/OT/speech evaluation-cleared  -Patient was seen by neurology-cleared question psychogenic symptoms     #Breast cancer:  Home regimen: Anastrozole 1 mg, mirtazapine 15 mg   #Hypertension  #Hyperlipidemia  -Continue home: Amlodipine 10 mg, lisinopril 40 mg, and rosuvastatin 20 mg  -Patient's LDL is elevated, 150s, increase the dose of statin  -Continue to monitor BP.   #GERD:  -Continue home med: Pantoprazole 40 mg daily     #Hypothyroidism:  -Continue home levothyroxine 25 mcg PO q AM.     Stable for discharge.     Today:   BP 117/66   Pulse 65   Temp 98.4 F (36.9 C) (Oral)   Resp 17   Ht 1.6 m (5\' 3" )   Wt 66.4 kg (146 lb 6.4 oz)   SpO2 97%   BMI 25.93 kg/m   Ranges for the last 24 hours:  Temp:  [98.3 F (36.8 C)-99.5 F (37.5 C)] 98.4 F (36.9 C)  Heart Rate:  [56-65] 65  Resp Rate:  [16-19] 17  BP:  (103-128)/(57-66) 117/66  Physical Exam  General: awake, alert X oriented x3  Cardiovascular: regular rate and rhythm, no murmurs, rubs or gallops  Lungs: clear to auscultation bilaterally, without wheezing, rhonchi, or rales  Abdomen: soft, non-tender, non-distended; no palpable masses,  normoactive bowel sounds  Extremities: no edema     Procedures performed:   Radiology: all results from this admission  MRI Brain with/without Contrast    Result Date: 01/14/2022  1.No acute intracranial abnormality. 2.Generalized mild atrophy and scattered white matter changes of presumed chronic small vessel disease. 3.Partially empty appearance of the sella, similar to prior MRI and potentially related to generalized volume loss. This finding is often incidental but can be seen with intracranial hypertension. Christy Gentles MD, MD 01/14/2022 5:23 PM    CTA  Head & Neck    Result Date: 01/14/2022   No occlusion or hemodynamically significant stenosis of the major intracranial arteries. No carotid or vertebral stenosis. Normal CT perfusion. Multinodular goiter. If not previously performed consider further evaluation with thyroid ultrasound on nonemergent basis. Comment: Assessment for acute infarct, threshold for ischemia tissue volume is Tmax > 6 s. Threshold for infarct core volume estimated CBF <30 percent. Threshold for poor collateral flow or severe delayed perfusion  is Tmax>10 sec. Nelya Ebadirad 01/14/2022 2:53 PM    CT Perfusion Brain    Result Date: 01/14/2022   No occlusion or hemodynamically significant stenosis of the major intracranial arteries. No carotid or vertebral stenosis. Normal CT perfusion. Multinodular goiter. If not previously performed consider further evaluation with thyroid ultrasound on nonemergent basis. Comment: Assessment for acute infarct, threshold for ischemia tissue volume is Tmax > 6 s. Threshold for infarct core volume estimated CBF <30 percent. Threshold for poor collateral flow or severe delayed  perfusion is Tmax>10 sec. Nelya Ebadirad 01/14/2022 2:53 PM    CT Head without Contrast    Result Date: 01/14/2022  No acute intracranial abnormality. CT can be relatively insensitive for acute infarct in the first 24-48 hours, and MRI should be considered if there is high clinical concern. Christy Gentles MD, MD 01/14/2022 2:29 PM   Surgery: all results from this admission  * No surgery found *  Lab Results last 48 Hours       Procedure Component Value Units Date/Time    GFR [[id number]] Collected: 01/16/22 0455     Updated: 01/16/22 0557     EGFR 52.8    Renal function panel [[id number]]  (Abnormal) Collected: 01/16/22 0455    Specimen: Blood Updated: 01/16/22 0557     Glucose 111 mg/dL      Sodium 782 mEq/L      Potassium 4.7 mEq/L      Chloride 105 mEq/L      CO2 29 mEq/L      BUN 13.0 mg/dL      Calcium 9.0 mg/dL      Creatinine 1.2 mg/dL      Albumin 3.3 g/dL      Phosphorus 3.7 mg/dL      Anion Gap 7.0    CBC without differential [[id number]]  (Abnormal) Collected: 01/16/22 0455    Specimen: Blood Updated: 01/16/22 0531     WBC 8.37 x10 3/uL      Hgb 9.4 g/dL      Hematocrit 57.8 %      Platelets 325 x10 3/uL      RBC 3.64 x10 6/uL      MCV 83.2 fL      MCH 25.8 pg      MCHC 31.0 g/dL      RDW 16 %      MPV 11.3 fL      Nucleated RBC 0.0 /100 WBC      Absolute NRBC 0.00 x10 3/uL     Hemoglobin A1C [[id number]] Collected: 01/14/22 1735     Updated: 01/15/22 2140     Hemoglobin A1C 5.4 %      Average Estimated Glucose 108.3 mg/dL     Lipid panel [[id number]]  (Abnormal) Collected: 01/15/22 0450    Specimen: Blood Updated: 01/15/22 0822     Cholesterol 226 mg/dL      Triglycerides 62 mg/dL      HDL 61 mg/dL      LDL Calculated 272 mg/dL      VLDL Calculated 12 mg/dL      Cholesterol / HDL Ratio 3.7 Index     Narrative:      If not done in the ED    APTT [[id number]]  (Abnormal) Collected: 01/15/22 0611     Updated: 01/15/22 0731     PTT 44 sec     Narrative:      If not done in the ED  Rescheduled by [id number] at 01/15/2022  04:52  Reason: Difficult draw/Unable   to collect specimen    Prothrombin time/INR [[id number]] Collected: 01/15/22 0611    Specimen: Blood Updated: 01/15/22 0731     PT 12.1 sec      PT INR 1.1    Narrative:      If not done in the ED  Rescheduled by [id number] at 01/15/2022 04:52 Reason: Difficult draw/Unable   to collect specimen    GFR [[id number]] Collected: 01/15/22 0450     Updated: 01/15/22 0547     EGFR >60.0    Narrative:      If not done in the ED    Renal function panel [[id number]] Collected: 01/15/22 0450    Specimen: Blood Updated: 01/15/22 0547     Glucose 99 mg/dL      BUN 08.6 mg/dL      Creatinine 0.9 mg/dL      Calcium 9.3 mg/dL      Sodium 578 mEq/L      Potassium 4.6 mEq/L      Chloride 108 mEq/L      CO2 27 mEq/L      Anion Gap 9.0     Albumin 3.7 g/dL      Phosphorus 4.0 mg/dL     Narrative:      If not done in the ED    CBC and differential [[id number]]  (Abnormal) Collected: 01/15/22 0450    Specimen: Blood Updated: 01/15/22 0525     WBC 7.84 x10 3/uL      Hgb 10.2 g/dL      Hematocrit 41.3 %      Platelets 237 x10 3/uL      RBC 3.99 x10 6/uL      MCV 85.0 fL      MCH 25.6 pg      MCHC 30.1 g/dL      RDW 16 %      MPV 11.9 fL      Instrument Absolute Neutrophil Count 5.30 x10 3/uL      Neutrophils 67.6 %      Lymphocytes Automated 20.4 %      Monocytes 9.4 %      Eosinophils Automated 1.4 %      Basophils Automated 0.8 %      Immature Granulocytes 0.4 %      Nucleated RBC 0.0 /100 WBC      Neutrophils Absolute 5.30 x10 3/uL      Lymphocytes Absolute Automated 1.60 x10 3/uL      Monocytes Absolute Automated 0.74 x10 3/uL      Eosinophils Absolute Automated 0.11 x10 3/uL      Basophils Absolute Automated 0.06 x10 3/uL      Immature Granulocytes Absolute 0.03 x10 3/uL      Absolute NRBC 0.00 x10 3/uL     Narrative:      If not done in the ED    High Sensitivity Troponin-I at 2 hrs with calculated Delta [[id number]] Collected: 01/14/22 1735    Specimen: Blood Updated: 01/14/22 1823     hs Troponin-I <2.7  ng/L      hs Troponin-I Delta Unable toCalc. ng/L     Comprehensive metabolic panel [[id number]]  (Abnormal) Collected: 01/14/22 1735    Specimen: Blood Updated: 01/14/22 1819     Glucose 94 mg/dL      BUN 74.2 mg/dL      Creatinine 1.1 mg/dL      Sodium 595 mEq/L      Potassium 4.1 mEq/L  Chloride 107 mEq/L      CO2 28 mEq/L      Calcium 9.4 mg/dL      Protein, Total 6.9 g/dL      Albumin 4.0 g/dL      AST (SGOT) 20 U/L      ALT 12 U/L      Alkaline Phosphatase 84 U/L      Bilirubin, Total 0.3 mg/dL      Globulin 2.9 g/dL      Albumin/Globulin Ratio 1.4     Anion Gap 9.0    Narrative:      Draw, unless already completed in the emergency room.  If not done in the ED    GFR [[id number]] Collected: 01/14/22 1735     Updated: 01/14/22 1819     EGFR 58.3    Narrative:      Draw, unless already completed in the emergency room.  If not done in the ED    CBC and differential [[id number]]  (Abnormal) Collected: 01/14/22 1735    Specimen: Blood Updated: 01/14/22 1759     WBC 10.56 x10 3/uL      Hgb 10.6 g/dL      Hematocrit 78.2 %      Platelets 348 x10 3/uL      RBC 4.15 x10 6/uL      MCV 84.1 fL      MCH 25.5 pg      MCHC 30.4 g/dL      RDW 16 %      MPV 11.7 fL      Instrument Absolute Neutrophil Count 7.35 x10 3/uL      Neutrophils 69.6 %      Lymphocytes Automated 22.3 %      Monocytes 6.4 %      Eosinophils Automated 0.9 %      Basophils Automated 0.5 %      Immature Granulocytes 0.3 %      Nucleated RBC 0.0 /100 WBC      Neutrophils Absolute 7.35 x10 3/uL      Lymphocytes Absolute Automated 2.35 x10 3/uL      Monocytes Absolute Automated 0.68 x10 3/uL      Eosinophils Absolute Automated 0.10 x10 3/uL      Basophils Absolute Automated 0.05 x10 3/uL      Immature Granulocytes Absolute 0.03 x10 3/uL      Absolute NRBC 0.00 x10 3/uL     Narrative:      Draw, unless already completed in the emergency room.  If not done in the ED    Urinalysis Reflex to Microscopic Exam- Reflex to Culture [[id number]]  (Abnormal) Collected:  01/14/22 1607     Updated: 01/14/22 1621     Urine Type Urine, Clean Ca     Color, UA Yellow     Clarity, UA Clear     Specific Gravity UA 1.020     Urine pH 7.0     Leukocyte Esterase, UA Negative     Nitrite, UA Negative     Protein, UR 30     Glucose, UA Negative     Ketones UA Negative     Urobilinogen, UA Negative mg/dL      Bilirubin, UA Negative     Blood, UA Negative     RBC, UA 0 - 2 /hpf      WBC, UA 0 - 5 /hpf     High Sensitivity Troponin-I at 0 hrs [[id number]] Collected: 01/14/22 1403    Specimen:  Blood Updated: 01/14/22 1452     hs Troponin-I 2.9 ng/L     Comprehensive metabolic panel [[id number]]  (Abnormal) Collected: 01/14/22 1403    Specimen: Blood Updated: 01/14/22 1450     Glucose 100 mg/dL      BUN 34.7 mg/dL      Creatinine 1.3 mg/dL      Sodium 425 mEq/L      Potassium 4.3 mEq/L      Chloride 105 mEq/L      CO2 27 mEq/L      Calcium 9.7 mg/dL      Protein, Total 7.4 g/dL      Albumin 4.2 g/dL      AST (SGOT) 23 U/L      ALT 15 U/L      Alkaline Phosphatase 84 U/L      Bilirubin, Total 0.2 mg/dL      Globulin 3.2 g/dL      Albumin/Globulin Ratio 1.3     Anion Gap 9.0    GFR [[id number]] Collected: 01/14/22 1403     Updated: 01/14/22 1450     EGFR 48.1    Prothrombin time/INR [[id number]] Collected: 01/14/22 1403    Specimen: Blood Updated: 01/14/22 1448     PT 11.4 sec      PT INR 1.0    APTT [[id number]] Collected: 01/14/22 1403     Updated: 01/14/22 1448     PTT 38 sec     CBC and differential [[id number]]  (Abnormal) Collected: 01/14/22 1403    Specimen: Blood Updated: 01/14/22 1436     WBC 10.55 x10 3/uL      Hgb 10.6 g/dL      Hematocrit 73.2 %      Platelets 363 x10 3/uL      RBC 4.14 x10 6/uL      MCV 84.1 fL      MCH 25.6 pg      MCHC 30.5 g/dL      RDW 16 %      MPV 11.7 fL      Instrument Absolute Neutrophil Count 7.06 x10 3/uL      Neutrophils 66.9 %      Lymphocytes Automated 22.6 %      Monocytes 8.5 %      Eosinophils Automated 1.0 %      Basophils Automated 0.4 %      Immature  Granulocytes 0.6 %      Nucleated RBC 0.0 /100 WBC      Neutrophils Absolute 7.06 x10 3/uL      Lymphocytes Absolute Automated 2.38 x10 3/uL      Monocytes Absolute Automated 0.90 x10 3/uL      Eosinophils Absolute Automated 0.11 x10 3/uL      Basophils Absolute Automated 0.04 x10 3/uL      Immature Granulocytes Absolute 0.06 x10 3/uL      Absolute NRBC 0.00 x10 3/uL     Glucose Whole Blood - POCT [[id number]]  (Abnormal) Collected: 01/14/22 1353     Updated: 01/14/22 1408     Whole Blood Glucose POCT 102 mg/dL             Treatment Team:   Attending Provider: Laretta Bolster, MD    Disposition:   Disposition: home with family    Condition at Discharge:   stable     Follow up Recommendations for Receiving Provider   Diet- Diet heart healthy      Unresulted Labs  None            Discharge Instructions:      Follow-up Information       Rondel Baton, MD Follow up in 1 week(s).    Specialty: Internal Medicine  Contact information:  [address]  [place] [address]  [phone]                              Medication List        CHANGE how you take these medications      rosuvastatin 40 MG tablet  Commonly known as: CRESTOR  Take 1 tablet (40 mg) by mouth daily  What changed: how much to take            CONTINUE taking these medications      acetaminophen 325 MG tablet  Commonly known as: TYLENOL  Take 2 tablets (650 mg) by mouth every 6 (six) hours as needed for Pain     amLODIPine 10 MG tablet  Commonly known as: NORVASC  Take 1 tablet (10 mg total) by mouth daily     anastrozole 1 MG tablet  Commonly known as: ARIMIDEX  Take 1 tablet (1 mg total) by mouth every morning     aspirin 81 MG chewable tablet  Chew 1 tablet (81 mg total) by mouth daily     desonide 0.05 % ointment  Commonly known as: DESOWEN  Apply topically 2 (two) times daily     diclofenac Sodium 1 % Gel topical gel  Commonly known as: VOLTAREN  Apply 2 g topically 4 (four) times daily     ezetimibe 10 MG tablet  Commonly known as:  ZETIA  Take 1 tablet (10 mg total) by mouth daily     gabapentin 100 MG capsule  Commonly known as: NEURONTIN  Take 1 capsule (100 mg total) by mouth 2 (two) times daily as needed (Neuropathy)     guaiFENesin 600 MG 12 hr tablet  Commonly known as: MUCINEX  Take 2 tablets (1,200 mg total) by mouth 2 (two) times daily     IRON PO     levothyroxine 25 MCG tablet  Commonly known as: SYNTHROID  Take 1 tablet (25 mcg total) by mouth every morning     lisinopril 40 MG tablet  Commonly known as: ZESTRIL  Take 1 tablet (40 mg total) by mouth daily     magnesium oxide 400 MG tablet  Commonly known as: MAG-OX  Take 1 tablet (400 mg total) by mouth daily     meclizine 25 MG tablet  Commonly known as: ANTIVERT  Take 1 tablet (25 mg) by mouth 3 (three) times daily as needed for Dizziness     mirtazapine 15 MG tablet  Commonly known as: REMERON  Take 1 tablet (15 mg total) by mouth nightly     multivitamin with minerals tablet     ondansetron 4 MG disintegrating tablet  Commonly known as: ZOFRAN-ODT  Take 1 tablet (4 mg) by mouth every 6 (six) hours as needed for Nausea     pantoprazole 40 MG tablet  Commonly known as: PROTONIX  Take 1 tablet (40 mg total) by mouth every morning before breakfast     PREVAGEN PO     traMADol 50 MG tablet  Commonly known as: ULTRAM  Take 1 tablet (50 mg total) by mouth every 6 (six) hours as needed for Pain  vitamin B-1 100 MG tablet  Commonly known as: THIAMINE            STOP taking these medications      ibuprofen 600 MG tablet  Commonly known as: ADVIL               Where to Get Your Medications        These medications were sent to [facility] [address] [place] - [address]  [address], [place] [address]      Phone: [phone]   rosuvastatin 40 MG tablet       Signed by: Laretta Bolster, MD

## 2022-01-16 NOTE — Nursing Progress Note (Signed)
New transfer from [facility]. Transported via bed.  Skin assessment done with AM nurse.   Alert and oriented x4. Able to make needs known. Pt denies any pain or respiratory distress at the moment. Oriented to unit and use of call light. Educated on plan of care, medications reason and side effect, verbalize understanding.

## 2022-01-16 NOTE — SLP Progress Note (Signed)
Speech Language Pathology    Consult received/chart review completed. Consult received per stroke protocol as patient admitted with dizziness. MRI negative for acute infarction. Patient with no reported changes in cognitive communicative functioning. Passed RN dysphagia screen and tolerating regular texture diet without concerns. No SLP intervention indicated at this time.     March Rummage, M.S. CCC-SLP  Phone: [phone]  01/16/2022

## 2022-01-16 NOTE — Plan of Care (Signed)
A&Ox4; VSS on room air  Tylenol administered for mild abdominal pain per pt request  CFG 2/10 met; pt denies dizziness at this time  Pt adequate for d/c; IV removed, telemetry monitor removed  Discharge instructions reviewed with pt; all questions answered  Pt will d/c home for self care      Problem: Every Day - Stroke  Goal: Neurological status is stable or improving  Outcome: Progressing  Flowsheets (Taken 01/16/2022 1240)  Neurological status is stable or improving:   Monitor/assess/document neurological assessment (Stroke: every 4 hours)   Perform CAM Assessment   Observe for seizure activity and initiate seizure precautions if indicated  Goal: Mobility/Activity is maintained at optimal level for patient  Outcome: Progressing  Flowsheets (Taken 01/16/2022 1240)  Mobility/activity is maintained at optimal level for patient:   Increase mobility as tolerated/progressive mobility   Maintain proper body alignment   Encourage independent activity per ability   Assess for changes in respiratory status, level of consciousness and/or development of fatigue     Problem: Safety  Goal: Patient will be free from injury during hospitalization  Outcome: Progressing  Flowsheets (Taken 01/16/2022 1240)  Patient will be free from injury during hospitalization:   Assess patient's risk for falls and implement fall prevention plan of care per policy   Provide and maintain safe environment   Use appropriate transfer methods   Ensure appropriate safety devices are available at the bedside   Include patient/ family/ care giver in decisions related to safety   Hourly rounding  Goal: Patient will be free from infection during hospitalization  Outcome: Progressing  Flowsheets (Taken 01/16/2022 1240)  Free from Infection during hospitalization:   Assess and monitor for signs and symptoms of infection   Monitor lab/diagnostic results   Monitor all insertion sites (i.e. indwelling lines, tubes, urinary catheters, and drains)   Encourage patient  and family to use good hand hygiene technique     Problem: Pain  Goal: Pain at adequate level as identified by patient  Outcome: Progressing  Flowsheets (Taken 01/16/2022 1240)  Pain at adequate level as identified by patient:   Identify patient comfort function goal   Assess for risk of opioid induced respiratory depression, including snoring/sleep apnea. Alert healthcare team of risk factors identified.   Assess pain on admission, during daily assessment and/or before any "as needed" intervention(s)   Reassess pain within 30-60 minutes of any procedure/intervention, per Pain Assessment, Intervention, Reassessment (AIR) Cycle   Evaluate if patient comfort function goal is met   Evaluate patient's satisfaction with pain management progress   Offer non-pharmacological pain management interventions     Problem: Discharge Barriers  Goal: Patient will be discharged home or other facility with appropriate resources  Outcome: Progressing  Flowsheets (Taken 01/16/2022 1240)  Discharge to home or other facility with appropriate resources: Initiate discharge planning     Problem: Psychosocial and Spiritual Needs  Goal: Demonstrates ability to cope with hospitalization/illness  Outcome: Progressing  Flowsheets (Taken 01/16/2022 1240)  Demonstrates ability to cope with hospitalizations/illness:   Encourage verbalization of feelings/concerns/expectations   Provide quiet environment   Assist patient to identify own strengths and abilities   Encourage patient to set small goals for self   Reinforce positive adaptation of new coping behaviors     Problem: Moderate/High Fall Risk Score >5  Goal: Patient will remain free of falls  Outcome: Progressing  Flowsheets (Taken 01/16/2022 1240)  Moderate Risk (6-13):   LOW-Fall Interventions Appropriate for Low Fall Risk  LOW-Anticoagulation education for injury risk   MOD-Apply bed exit alarm if patient is confused     Problem: Neurological Deficit  Goal: Neurological status is stable or  improving  Outcome: Progressing  Flowsheets (Taken 01/16/2022 1240)  Neurological status is stable or improving:   Monitor/assess/document neurological assessment (Stroke: every 4 hours)   Perform CAM Assessment   Observe for seizure activity and initiate seizure precautions if indicated     Problem: Impaired Mobility  Goal: Mobility/Activity is maintained at optimal level for patient  Outcome: Progressing  Flowsheets (Taken 01/16/2022 1240)  Mobility/activity is maintained at optimal level for patient:   Increase mobility as tolerated/progressive mobility   Maintain proper body alignment   Encourage independent activity per ability   Assess for changes in respiratory status, level of consciousness and/or development of fatigue

## 2022-02-15 ENCOUNTER — Emergency Department
Admission: EM | Admit: 2022-02-15 | Discharge: 2022-02-15 | Disposition: A | Discharge status: Home / Self Care | Payer: Medicare Other | Attending: Emergency Medical Services | Admitting: Emergency Medical Services

## 2022-02-15 ENCOUNTER — Emergency Department: Payer: Medicare Other

## 2022-02-15 DIAGNOSIS — R1084 Generalized abdominal pain: Secondary | ICD-10-CM | POA: Insufficient documentation

## 2022-02-15 DIAGNOSIS — R079 Chest pain, unspecified: Secondary | ICD-10-CM

## 2022-02-15 DIAGNOSIS — R42 Dizziness and giddiness: Secondary | ICD-10-CM

## 2022-02-15 DIAGNOSIS — R531 Weakness: Secondary | ICD-10-CM | POA: Insufficient documentation

## 2022-02-15 DIAGNOSIS — R0789 Other chest pain: Secondary | ICD-10-CM | POA: Insufficient documentation

## 2022-02-15 DIAGNOSIS — Z8673 Personal history of transient ischemic attack (TIA), and cerebral infarction without residual deficits: Secondary | ICD-10-CM | POA: Insufficient documentation

## 2022-02-15 LAB — CBC AND DIFFERENTIAL
Absolute NRBC: 0 10*3/uL (ref 0.00–0.00)
Basophils Absolute Automated: 0.04 10*3/uL (ref 0.00–0.08)
Basophils Automated: 0.6 %
Eosinophils Absolute Automated: 0.11 10*3/uL (ref 0.00–0.44)
Eosinophils Automated: 1.7 %
Hematocrit: 34 % — ABNORMAL LOW (ref 34.7–43.7)
Hgb: 10.3 g/dL — ABNORMAL LOW (ref 11.4–14.8)
Immature Granulocytes Absolute: 0.02 10*3/uL (ref 0.00–0.07)
Immature Granulocytes: 0.3 %
Instrument Absolute Neutrophil Count: 4.02 10*3/uL (ref 1.10–6.33)
Lymphocytes Absolute Automated: 1.65 10*3/uL (ref 0.42–3.22)
Lymphocytes Automated: 25.9 %
MCH: 25.6 pg (ref 25.1–33.5)
MCHC: 30.3 g/dL — ABNORMAL LOW (ref 31.5–35.8)
MCV: 84.6 fL (ref 78.0–96.0)
MPV: 11.1 fL (ref 8.9–12.5)
Monocytes Absolute Automated: 0.52 10*3/uL (ref 0.21–0.85)
Monocytes: 8.2 %
Neutrophils Absolute: 4.02 10*3/uL (ref 1.10–6.33)
Neutrophils: 63.3 %
Nucleated RBC: 0 /100 WBC (ref 0.0–0.0)
Platelets: 328 10*3/uL (ref 142–346)
RBC: 4.02 10*6/uL (ref 3.90–5.10)
RDW: 15 % (ref 11–15)
WBC: 6.36 10*3/uL (ref 3.10–9.50)

## 2022-02-15 LAB — COMPREHENSIVE METABOLIC PANEL
ALT: 14 U/L (ref 0–55)
AST (SGOT): 23 U/L (ref 5–41)
Albumin/Globulin Ratio: 1.2 (ref 0.9–2.2)
Albumin: 4 g/dL (ref 3.5–5.0)
Alkaline Phosphatase: 93 U/L (ref 37–117)
Anion Gap: 10 (ref 5.0–15.0)
BUN: 10 mg/dL (ref 7.0–21.0)
Bilirubin, Total: 0.2 mg/dL (ref 0.2–1.2)
CO2: 28 mEq/L (ref 17–29)
Calcium: 9.4 mg/dL (ref 7.9–10.2)
Chloride: 105 mEq/L (ref 99–111)
Creatinine: 1.1 mg/dL — ABNORMAL HIGH (ref 0.4–1.0)
Globulin: 3.3 g/dL (ref 2.0–3.6)
Glucose: 89 mg/dL (ref 70–100)
Potassium: 4.3 mEq/L (ref 3.5–5.3)
Protein, Total: 7.3 g/dL (ref 6.0–8.3)
Sodium: 143 mEq/L (ref 135–145)
eGFR: 51.6 mL/min/{1.73_m2} — AB (ref >=60)

## 2022-02-15 LAB — URINALYSIS REFLEX TO MICROSCOPIC EXAM - REFLEX TO CULTURE
Bilirubin, UA: NEGATIVE
Blood, UA: NEGATIVE
Glucose, UA: NEGATIVE
Ketones UA: NEGATIVE
Leukocyte Esterase, UA: NEGATIVE
Nitrite, UA: NEGATIVE
Protein, UR: 30 — AB
Specific Gravity UA: 1.006 (ref 1.001–1.035)
Urine pH: 7 (ref 5.0–8.0)
Urobilinogen, UA: NEGATIVE mg/dL (ref 0.2–2.0)

## 2022-02-15 LAB — MAGNESIUM: Magnesium: 2.3 mg/dL (ref 1.6–2.6)

## 2022-02-15 LAB — HIGH SENSITIVITY TROPONIN-I: hs Troponin-I: 3.4 ng/L

## 2022-02-15 LAB — ECG 12-LEAD: Q-T Interval: 416 ms

## 2022-02-15 LAB — PROBNP: NT-proBNP: 129 pg/mL (ref 0–450)

## 2022-02-15 MED ORDER — SODIUM CHLORIDE 0.9 % IV BOLUS
500.0000 mL | Freq: Once | INTRAVENOUS | Status: AC
Start: 2022-02-15 — End: 2022-02-15
  Administered 2022-02-15: 500 mL via INTRAVENOUS

## 2022-02-15 MED ORDER — IOHEXOL 350 MG/ML IV SOLN
100.0000 mL | Freq: Once | INTRAVENOUS | Status: AC | PRN
Start: 2022-02-15 — End: 2022-02-15
  Administered 2022-02-15: 100 mL via INTRAVENOUS

## 2022-02-15 NOTE — Discharge Instructions (Signed)
Please follow up with your primary doctor in 2 days.   Please return to the Emergency room for Chest Pain, Shortness of breath, Vomiting.  Please take 650 mg of Tylenol up to 3 times a day with food for discomfort.

## 2022-02-15 NOTE — EDIE (Signed)
COLLECTIVE?NOTIFICATION?02/15/2022 10:36?Nhi, Butrum B?MRN: [id number]    [place] [facility]'s patient encounter information:   BMW:[id number]  Account [id number]  Billing Account [id number]      Criteria Met      5 ED Visits in 12 Months    Security and Safety  No Security Events were found.  ED Care Guidelines  There are currently no ED Care Guidelines for this patient. Please check your facility's medical records system.        Prescription Monitoring Program  000??- Narcotic Use Score  000??- Sedative Use Score  000??- Stimulant Use Score  000??- Overdose Risk Score  - All Scores range from 000-999 with 75% of the population scoring < 200 and on 1% scoring above 650  - The last digit of the narcotic, sedative, and stimulant score indicates the number of active prescriptions of that type  - Higher Use scores correlate with increased prescribers, pharmacies, mg equiv, and overlapping prescriptions  - Higher Overdose Risk Scores correlate with increased risk of unintentional overdose death   Concerning or unexpectedly high scores should prompt a review of the PMP record; this does not constitute checking PMP for prescribing purposes.    E.D. Visit Count (12 mo.)  Facility Visits   [place] - [facility] 10   Total 10   Note: Visits indicate total known visits.     Recent Emergency Department Visit Summary  Date Facility [facility] Type Diagnoses or Chief Complaint    Feb 15, 2022  [place] [facility]  [place]  [place]  Emergency      Generalized Weakness      Jan 14, 2022  [place] [facility]  [place]  [place]  Emergency      L side numbness      Cerebrovascular Accident      Dizziness and giddiness      Unspecified visual loss      Unspecified symptoms and signs involving the nervous system      Nov 30, 2021  [place] [facility]  [place]  [place]  Emergency      Weakness; Fatigue      Fatigue      Dizziness and giddiness      Pulmonary fibrosis, unspecified      Nov 17, 2021  [place] -  [facility]  [place]  [place]  Emergency      Pain in right side, breast cancer patient      Dysuria      Abdominal Pain      Nausea      Scar Tissue Pain      Calculus of gallbladder without cholecystitis without obstruction      Unspecified abdominal pain      Nov 11, 2021  [place] [facility]  [place]  [place]  Emergency      chest pain,dizziness,weakness      Syncope      Chest Pain      Paresthesia of skin      Chest pain, unspecified      Acute kidney failure, unspecified      Syncope and collapse      Dizziness and giddiness      Nontoxic single thyroid nodule      Oct 15, 2021  [place] [facility]  [place]    Emergency      palpitations shortness of breath      Palpitations  Other chest pain      May 22, 2021  [place] [facility]  [place]  [place]  Emergency      Abd Pain/Weakness      Dizziness      Abdominal Pain      Nausea      Diarrhea      Right lower quadrant pain      Vascular disorder of intestine, unspecified      Apr 21, 2021  [place] - [facility]  [place]  [place]  Emergency      headache, body aches, leg tingling      Dizziness      Chest wall pain      Chondrocostal junction syndrome [[place]]      Mar 08, 2021  [place] [facility]  [place]  [place]  Emergency      Medic      Altered Mental Status      Altered mental status, unspecified      Mar 04, 2021  [place] - [facility]  [place]  [place]  Emergency      cough/body aches      Generalized Body Aches      Myalgia, unspecified site      Sciatica, left side      Contact with and (suspected) exposure to COVID-19      Urinary tract infection, site not specified        Recent Inpatient Visit Summary  Date Facility [facility] Type Diagnoses or Chief Complaint    Jan 14, 2022  [place] [facility]  [place]  [place]  Medical Surgical      Dizziness and giddiness      Unspecified symptoms and signs involving the nervous system      Unspecified visual loss      May 22, 2021  [place] [facility]  [place]  [place]  Medical Surgical      Vascular disorder of  intestine, unspecified      Right lower quadrant pain      Estrogen receptor positive status [ER+]      Malignant neoplasm of overlapping sites of right female breast        Care Team  Provider Specialty Phone Fax Service Dates   Glenn Dale, [facility] , DO Internal Medicine   Current    Rondel Baton, M.D. Internal Medicine   Current    DAVIDSON, Janet Berlin MD Judie Petit, MD Family Medicine: Adult Medicine [phone] Current    Santa Lighter, M.D. Internal Medicine   Current    Cornelius Moras, NP Nurse Practitioner: Adult Health   Current    Sandria Bales Nurse Practitioner: Gerontology [phone]  Current      Collect[employer] Portal  This patient has registered at the [facility] Emergency Department   For more information visit: [email or web address]     PLEASE NOTE:     1.   Any care recommendations and other clinical information are provided as guidelines or for historical purposes only, and providers should exercise their own clinical judgment when providing care.    2.   You may only use this information for purposes of treatment, payment or health care operations activities, and subject to the limitations of applicable [employer] Polic[employer].    3.   You should consult directly with the organization that provided a care guideline or other clinical history with any questions about additional information  or accuracy or completeness of information provided.    ? 2023 [employer] - [email or web address]

## 2022-02-15 NOTE — ED Triage Notes (Signed)
See quick triage note.

## 2022-02-16 LAB — ECG 12-LEAD
Atrial Rate: 60 {beats}/min
P Axis: 46 degrees
P-R Interval: 192 ms
QRS Duration: 92 ms
QTC Calculation (Bezet): 416 ms
R Axis: 54 degrees
T Axis: 48 degrees
Ventricular Rate: 60 {beats}/min

## 2022-02-18 NOTE — ED Provider Notes (Signed)
EMERGENCY DEPARTMENT NOTE     Patient initially seen and examined at   ED PHYSICIAN ASSIGNED       Date/Time Event User Comments    02/15/22 1105 Physician Assigned Emilie Rutter, MD assigned as Attending       HISTORY OF PRESENT ILLNESS   Patient Name: Emily [name]   Medical Record Number: [id number]  AGE: 76 y.o.  DOB: December 23, 1945    Chief Complaint: No chief complaint on file.       Emily [name] is a 76 y.o. female with past medical history of hypertension, hyperlipidemia who presents to the emergency room for headache and feeling dizzy.  Patient is also complaining of nausea symptoms that started on Saturday.  Patient is complaining of right-sided chest pain that is at the site of her surgical site where she had her breast cancer.  Patient is also complaining of abdominal discomfort.  Patient is also complaining of overall feeling weak.  No new neurofocal deficits.    Independent Historian (other than patient):   Additional History Provided by Independent Historian:  MEDICAL HISTORY     Past Medical History:  Past Medical History:   Diagnosis Date    Breast cancer 2014    right breast mastectomy    Exercise-induced angina     negative work up    GERD (gastroesophageal reflux disease)     Headache     resolved    Hyperlipidemia     Hypertension     Hypothyroidism     Low back pain     Malignant neoplasm of overlapping sites of right female breast 10/19/2015    Syncope and collapse     Chronic Vertigo    Vertigo        Past Surgical History:  Past Surgical History:   Procedure Laterality Date    COLONOSCOPY  2021    COLONOSCOPY, POLYPECTOMY N/A 07/21/2020    Procedure: COLONOSCOPY, POLYPECTOMY;  Surgeon: Einar Grad, MD;  Location: [facility];  Service: Gastroenterology;  Laterality: N/A;    EGD, BIOPSY N/A 07/21/2020    Procedure: EGD, BIOPSY;  Surgeon: Einar Grad, MD;  Location: [facility];  Service: Gastroenterology;  Laterality: N/A;    EXPLORATORY LAPAROTOMY N/A 05/22/2021     Procedure: EXPLORATORY LAPAROTOMY;  Surgeon: Delena Serve, MD;  Location: [facility];  Service: General;  Laterality: N/A;    HYSTERECTOMY      LAPAROSCOPY, DIAGNOSTIC N/A 05/22/2021    Procedure: LAPAROSCOPY, DIAGNOSTIC;  Surgeon: Delena Serve, MD;  Location: [facility];  Service: General;  Laterality: N/A;    LAPAROTOMY, COLECTOMY, RIGHT Right 05/22/2021    Procedure: LAPAROTOMY, COLECTOMY, RIGHT;  Surgeon: Delena Serve, MD;  Location: [facility];  Service: General;  Laterality: Right;    MASTECTOMY Right 2015       Social History:  Social History     Socioeconomic History    Marital status: Widowed   Tobacco Use    Smoking status: Never    Smokeless tobacco: Never   Vaping Use    Vaping status: Never Used   Substance and Sexual Activity    Alcohol use: Not Currently     Comment: occ    Drug use: No    Sexual activity: Not Currently       Family History:  Family History   Problem Relation Age of Onset    Breast cancer Neg Hx  Outpatient Medication:  Discharge Medication List as of 02/15/2022  2:19 PM        CONTINUE these medications which have NOT CHANGED    Details   amLODIPine (NORVASC) 10 MG tablet Take 1 tablet (10 mg total) by mouth daily, Starting Fri 04/02/2021, E-Rx      anastrozole (ARIMIDEX) 1 MG tablet Take 1 tablet (1 mg total) by mouth every morning, Starting Tue 06/01/2021, Normal      aspirin 81 MG chewable tablet Chew 1 tablet (81 mg total) by mouth daily, Starting Wed 01/29/2020, E-Rx      desonide (DESOWEN) 0.05 % ointment Apply topically 2 (two) times daily, Starting Sun 05/10/2020, E-Rx      diclofenac Sodium (VOLTAREN) 1 % Gel topical gel Apply 2 g topically 4 (four) times daily, Starting Wed 11/17/2021, E-Rx      ezetimibe (ZETIA) 10 MG tablet Take 1 tablet (10 mg total) by mouth daily, Starting Tue 10/20/2020, E-Rx      gabapentin (NEURONTIN) 100 MG capsule Take 1 capsule (100 mg total) by mouth 2 (two) times daily as needed (Neuropathy), Starting Tue  06/01/2021, Print      meclizine (ANTIVERT) 25 MG tablet Take 1 tablet (25 mg) by mouth 3 (three) times daily as needed for Dizziness, Starting Thu 11/11/2021, E-Rx      ondansetron (ZOFRAN-ODT) 4 MG disintegrating tablet Take 1 tablet (4 mg) by mouth every 6 (six) hours as needed for Nausea, Starting Tue 11/30/2021, E-Rx      pantoprazole (PROTONIX) 40 MG tablet Take 1 tablet (40 mg total) by mouth every morning before breakfast, Starting Wed 01/29/2020, E-Rx      rosuvastatin (CRESTOR) 40 MG tablet Take 1 tablet (40 mg) by mouth daily, Starting Sun 01/16/2022, E-Rx      acetaminophen (TYLENOL) 325 MG tablet Take 2 tablets (650 mg) by mouth every 6 (six) hours as needed for Pain, Starting Wed 11/17/2021, E-Rx      Apoaequorin (PREVAGEN PO) Take by mouth daily, Historical Med      Ferrous Sulfate (IRON PO) Take by mouth, Historical Med      guaiFENesin (MUCINEX) 600 MG 12 hr tablet Take 2 tablets (1,200 mg total) by mouth 2 (two) times daily, Starting Sun 08/30/2020, E-Rx      levothyroxine (SYNTHROID) 25 MCG tablet Take 1 tablet (25 mcg total) by mouth every morning, Starting Wed 01/29/2020, E-Rx      lisinopril (ZESTRIL) 40 MG tablet Take 1 tablet (40 mg total) by mouth daily, Starting Fri 04/02/2021, E-Rx      magnesium oxide (MAG-OX) 400 MG tablet Take 1 tablet (400 mg total) by mouth daily, Starting Fri 05/29/2020, E-Rx      mirtazapine (REMERON) 15 MG tablet Take 1 tablet (15 mg total) by mouth nightly, Starting Tue 06/01/2021, E-Rx      Multiple Vitamins-Minerals (MULTIVITAMIN WITH MINERALS) tablet Take 1 tablet by mouth every morning    , Historical Med      traMADol (ULTRAM) 50 MG tablet Take 1 tablet (50 mg total) by mouth every 6 (six) hours as needed for Pain, Starting Tue 06/01/2021, Print      vitamin B-1 (THIAMINE) 100 MG tablet Take 100 mg by mouth every morning., Historical Med             REVIEW OF SYSTEMS     Review of System other than what's stated in HPI is negative. See History of Present  Illness.    PHYSICAL EXAM  ED Triage Vitals   Enc Vitals Group      BP 02/15/22 1044 139/63      Heart Rate 02/15/22 1044 61      Resp Rate 02/15/22 1044 16      Temp 02/15/22 1044 98.9 F (37.2 C)      Temp Source 02/15/22 1434 Oral      SpO2 02/15/22 1044 99 %      Weight 02/15/22 1044 65.2 kg      Height 02/15/22 1044 1.6 m      Head Circumference --       Peak Flow --       Pain Score 02/15/22 1044 7      Pain Loc --       Pain Edu? --       Excl. in GC? --        Constitutional: Resting comfortably no acute distress  Head: Normocephalic, atraumatic.  Eyes: Pupils equal, round,reactive to light.  DPO:EUMPN oropharynx.  No stridor.  Neck: Supple.  Without meningismus.  Respiratory: Lungs clear to auscultation bilaterally.  No wheeze.  Cardiovascular: Regular rate and rhythm.  No rub.  Abdomen: Slight tenderness diffusely otherwise soft, non-distended.  Extremities: Well-perfused.  No significant edema.  Skin: Warm and dry.  No significant jaundice.  Neurologic: Alert and oriented.  Normal cranial nerve, sensory and motor exam.  Back: Normal on inspection. No midline point tenderness or stepoffs.  Psych: Normal affect, mood congruent    CARDIAC STUDIES      The following cardiac studies were independently interpreted by me the Emergency Medicine Provider.  For full cardiac study results please see chart.    Monitor Strip:  Sinus rhythm at 68 BPM with no ectopy.   EKG Interpretation:  The EKG was reviewed, interpreted and analyzed by me, Dr. Macky Lower  EKG: Sinus Rhythm at 60 BPM, no significant ST/T elevation, no QT prolongation, no blocks, as interpreted by me. EKG interpretation: Normal    EMERGENCY IMAGING STUDIES    The following imagine studies were independently interpreted by me (emergency physician):    RADIOLOGY IMAGING STUDIES      CT Angio Chest   Final Result       1.No evidence of pulmonary embolism.   2.Scattered tree-in-bud opacities in the posterior right lower lobe, likely infectious  bronchiolitis. Recommend follow-up chest radiograph in 4-6 weeks reassess.   3.No acute abdominopelvic abnormality.   4.Other chronic findings as described.      Baruch Merl, MD   02/15/2022 2:00 PM      CT Abd/Pelvis with IV Contrast   Final Result       1.No evidence of pulmonary embolism.   2.Scattered tree-in-bud opacities in the posterior right lower lobe, likely infectious bronchiolitis. Recommend follow-up chest radiograph in 4-6 weeks reassess.   3.No acute abdominopelvic abnormality.   4.Other chronic findings as described.      Baruch Merl, MD   02/15/2022 2:00 PM      CT Head without Contrast   Final Result       No acute intracranial abnormality.      Baruch Merl, MD   02/15/2022 1:44 PM          The following imagine studies were independently interpreted by me (emergency medicine provider):    PULSE OXIMETRY    Oxygen Saturation by Pulse Oximetry: 99%  Interventions: none  Interpretation:  Adequate    EMERGENCY DEPT. MEDICATIONS      ED  Medication Orders (From admission, onward)      Start Ordered     Status Ordering Provider    02/15/22 1340 02/15/22 1340  iohexol (OMNIPAQUE) 350 MG/ML injection 100 mL  IMG once as needed        Route: Intravenous  Ordered Dose: 100 mL       Last MAR action: Imaging Agent Given Macky Lower    02/15/22 1234 02/15/22 1233  sodium chloride 0.9 % bolus 500 mL  Once        Route: Intravenous  Ordered Dose: 500 mL       Last MAR action: Stopped Gladie Gravette            LABORATORY RESULTS    Ordered and independently interpreted AVAILABLE laboratory tests. Please see results section in chart for full details.  Results for orders placed or performed during the hospital encounter of 02/15/22   Comprehensive metabolic panel   Result Value Ref Range    Glucose 89 70 - 100 mg/dL    BUN 16.1 7.0 - 09.6 mg/dL    Creatinine 1.1 (H) 0.4 - 1.0 mg/dL    Sodium 045 409 - 811 mEq/L    Potassium 4.3 3.5 - 5.3 mEq/L    Chloride 105 99 - 111 mEq/L    CO2 28 17 - 29 mEq/L    Calcium 9.4 7.9 -  10.2 mg/dL    Protein, Total 7.3 6.0 - 8.3 g/dL    Albumin 4.0 3.5 - 5.0 g/dL    AST (SGOT) 23 5 - 41 U/L    ALT 14 0 - 55 U/L    Alkaline Phosphatase 93 37 - 117 U/L    Bilirubin, Total 0.2 0.2 - 1.2 mg/dL    Globulin 3.3 2.0 - 3.6 g/dL    Albumin/Globulin Ratio 1.2 0.9 - 2.2    Anion Gap 10.0 5.0 - 15.0    eGFR 51.6 (A) >=60 mL/min/1.73 m2   CBC and differential   Result Value Ref Range    WBC 6.36 3.10 - 9.50 x10 3/uL    Hgb 10.3 (L) 11.4 - 14.8 g/dL    Hematocrit 91.4 (L) 34.7 - 43.7 %    Platelets 328 142 - 346 x10 3/uL    RBC 4.02 3.90 - 5.10 x10 6/uL    MCV 84.6 78.0 - 96.0 fL    MCH 25.6 25.1 - 33.5 pg    MCHC 30.3 (L) 31.5 - 35.8 g/dL    RDW 15 11 - 15 %    MPV 11.1 8.9 - 12.5 fL    Instrument Absolute Neutrophil Count 4.02 1.10 - 6.33 x10 3/uL    Neutrophils 63.3 None %    Lymphocytes Automated 25.9 None %    Monocytes 8.2 None %    Eosinophils Automated 1.7 None %    Basophils Automated 0.6 None %    Immature Granulocytes 0.3 None %    Nucleated RBC 0.0 0.0 - 0.0 /100 WBC    Neutrophils Absolute 4.02 1.10 - 6.33 x10 3/uL    Lymphocytes Absolute Automated 1.65 0.42 - 3.22 x10 3/uL    Monocytes Absolute Automated 0.52 0.21 - 0.85 x10 3/uL    Eosinophils Absolute Automated 0.11 0.00 - 0.44 x10 3/uL    Basophils Absolute Automated 0.04 0.00 - 0.08 x10 3/uL    Immature Granulocytes Absolute 0.02 0.00 - 0.07 x10 3/uL    Absolute NRBC 0.00 0.00 - 0.00 x10 3/uL   Urinalysis Reflex to Microscopic Exam-  Reflex to Culture   Result Value Ref Range    Urine Type Urine, Clean Ca     Color, UA Yellow Colorless - Yellow    Clarity, UA Clear Clear - Hazy    Specific Gravity UA 1.006 1.001 - 1.035    Urine pH 7.0 5.0 - 8.0    Leukocyte Esterase, UA Negative Negative    Nitrite, UA Negative Negative    Protein, UR 30 (A) Negative    Glucose, UA Negative Negative    Ketones UA Negative Negative    Urobilinogen, UA Negative 0.2 - 2.0 mg/dL    Bilirubin, UA Negative Negative    Blood, UA Negative Negative    RBC, UA 0 - 2 0 -  5 /hpf    WBC, UA 0 - 5 0 - 5 /hpf    Squamous Epithelial Cells, Urine 0 - 5 0 - 25 /hpf   Magnesium   Result Value Ref Range    Magnesium 2.3 1.6 - 2.6 mg/dL   High Sensitivity Troponin-I at 0 hrs   Result Value Ref Range    hs Troponin-I 3.4 SEE BELOW ng/L   NT-proBNP   Result Value Ref Range    NT-proBNP 129 0 - 450 pg/mL   ECG 12 Lead   Result Value Ref Range    Ventricular Rate 60 BPM    Atrial Rate 60 BPM    P-R Interval 192 ms    QRS Duration 92 ms    Q-T Interval 416 ms    QTC Calculation (Bezet) 416 ms    P Axis 46 degrees    R Axis 54 degrees    T Axis 48 degrees    [facility] MUSE NARRATIVE AND IMPRESSION       SINUS RHYTHM WITH MARKED SINUS ARRYTHMIA    Confirmed by Katrinka Blazing MD, Beckey Rutter [phone]) on 02/16/2022 11:55:41 PM         PROCEDURES    Procedures        MEDICAL DECISION MAKING     Based on my initial evaluations orders written.      Patient initially treated with:   Medications   sodium chloride 0.9 % bolus 500 mL (0 mLs Intravenous Stopped 02/15/22 1400)   iohexol (OMNIPAQUE) 350 MG/ML injection 100 mL (100 mLs Intravenous Imaging Agent Given 02/15/22 1341)           PRIMARY PROBLEM LIST      Acute illness/injury with risk to life or bodily function (based on differential diagnosis or evaluation) DIAGNOSIS: Chest pain, abdominal pain, generalized weakness     Differential Diagnosis: Chest Pain: Pneumothorax, Unstable Angina, Pericarditis, Aortic Dissection, Esophageal Perforation, Pleurisy, Musculoskeletal, pulmonary embolism, acute coronary syndrome, myocardial infarction, myocarditis, pericardial effusion, pericardial tamponade  Each of the differential diagnosis has been considered for diagnosis and weighed risk and benefit of further testing and evaluation in the context of patient complaint. Some diagnosis do not warrant further testing including imaging and/or labs. However, all differential diagnosis have been considered.    DISCUSSION      76 year old female with past medical history of stroke who  presents to the ED for multiple complaints including dizziness, chest pain, abdominal pain.  Patient is also complaining of generalized weakness.  Decision was made to rule out intracranial pathology in addition to CT angiogram was to rule out pulmonary embolism given that she is complaining of chest pain has history of cancer.  CT abdomen was ordered to rule out any type of intra-abdominal pathology  as she was complaining of generalized abdominal pain and was slightly tender on exam.    The emergency provider has discussed at length with patient regarding symptoms and diagnostics findings, in addition to providing specific details for the plan of care.             Based on the patient's improved condition, decision made with patient for discharge. Plan discussed with the patient, and all questions and concerns addressed accordingly. Appropriate return precautions explained, patient states a clear understanding of the plan provided and is comfortable and agreeable for discharge.      If patient is being hospitalized is severe sepsis or septic shock suspected?: N/A  Was management discussed with a consultant?: N/A  External Records Reviewed?: Physician Office Records  Past medical records found on epic reviewed.     Vital Signs: Reviewed the patient's vital signs.   Nursing Notes: Reviewed and utilized available nursing notes.  Medical Records Reviewed: Reviewed available past medical records.  Counseling: The emergency provider has spoken with the patient and discussed today's findings, in addition to providing specific details for the plan of care.  Questions are answered and there is agreement with the plan.      Additional Notes                  MIPS DOCUMENTATION              Prescriptions:  Discharge Medication List as of 02/15/2022  2:19 PM        CONTINUE these medications which have NOT CHANGED    Details   amLODIPine (NORVASC) 10 MG tablet Take 1 tablet (10 mg total) by mouth daily, Starting Fri 04/02/2021,  E-Rx      anastrozole (ARIMIDEX) 1 MG tablet Take 1 tablet (1 mg total) by mouth every morning, Starting Tue 06/01/2021, Normal      aspirin 81 MG chewable tablet Chew 1 tablet (81 mg total) by mouth daily, Starting Wed 01/29/2020, E-Rx      desonide (DESOWEN) 0.05 % ointment Apply topically 2 (two) times daily, Starting Sun 05/10/2020, E-Rx      diclofenac Sodium (VOLTAREN) 1 % Gel topical gel Apply 2 g topically 4 (four) times daily, Starting Wed 11/17/2021, E-Rx      ezetimibe (ZETIA) 10 MG tablet Take 1 tablet (10 mg total) by mouth daily, Starting Tue 10/20/2020, E-Rx      gabapentin (NEURONTIN) 100 MG capsule Take 1 capsule (100 mg total) by mouth 2 (two) times daily as needed (Neuropathy), Starting Tue 06/01/2021, Print      meclizine (ANTIVERT) 25 MG tablet Take 1 tablet (25 mg) by mouth 3 (three) times daily as needed for Dizziness, Starting Thu 11/11/2021, E-Rx      ondansetron (ZOFRAN-ODT) 4 MG disintegrating tablet Take 1 tablet (4 mg) by mouth every 6 (six) hours as needed for Nausea, Starting Tue 11/30/2021, E-Rx      pantoprazole (PROTONIX) 40 MG tablet Take 1 tablet (40 mg total) by mouth every morning before breakfast, Starting Wed 01/29/2020, E-Rx      rosuvastatin (CRESTOR) 40 MG tablet Take 1 tablet (40 mg) by mouth daily, Starting Sun 01/16/2022, E-Rx      acetaminophen (TYLENOL) 325 MG tablet Take 2 tablets (650 mg) by mouth every 6 (six) hours as needed for Pain, Starting Wed 11/17/2021, E-Rx      Apoaequorin (PREVAGEN PO) Take by mouth daily, Historical Med      Ferrous Sulfate (IRON PO) Take by mouth,  Historical Med      guaiFENesin (MUCINEX) 600 MG 12 hr tablet Take 2 tablets (1,200 mg total) by mouth 2 (two) times daily, Starting Sun 08/30/2020, E-Rx      levothyroxine (SYNTHROID) 25 MCG tablet Take 1 tablet (25 mcg total) by mouth every morning, Starting Wed 01/29/2020, E-Rx      lisinopril (ZESTRIL) 40 MG tablet Take 1 tablet (40 mg total) by mouth daily, Starting Fri 04/02/2021, E-Rx      magnesium  oxide (MAG-OX) 400 MG tablet Take 1 tablet (400 mg total) by mouth daily, Starting Fri 05/29/2020, E-Rx      mirtazapine (REMERON) 15 MG tablet Take 1 tablet (15 mg total) by mouth nightly, Starting Tue 06/01/2021, E-Rx      Multiple Vitamins-Minerals (MULTIVITAMIN WITH MINERALS) tablet Take 1 tablet by mouth every morning    , Historical Med      traMADol (ULTRAM) 50 MG tablet Take 1 tablet (50 mg total) by mouth every 6 (six) hours as needed for Pain, Starting Tue 06/01/2021, Print      vitamin B-1 (THIAMINE) 100 MG tablet Take 100 mg by mouth every morning., Historical Med             DIAGNOSIS      Diagnosis:  Final diagnoses:   Generalized weakness   Chest pain, unspecified type   Generalized abdominal pain       Disposition:  ED Disposition       ED Disposition   Discharge    Condition   --    Date/Time   Tue Feb 15, 2022  2:06 PM    Comment   Deedra EhrichMary Beatrice Dinger discharge to home/self care.    Condition at disposition: Stable                 This note was generated by the Epic EMR system/Dragon speech recognition and may contain inherent errors or omissions not intended by the user. Grammatical errors, random word insertions, deletions, pronoun errors and incomplete sentences are occasional consequences of this technology due to software limitations. Not all errors are caught or corrected. If there are questions or concerns about the content of this note or information contained within the body of this dictation they should be addressed directly with the author for clarification.     Macky LowerFaiq, Rylynn Kobs, MD  02/18/22 0100

## 2022-04-07 ENCOUNTER — Encounter (INDEPENDENT_AMBULATORY_CARE_PROVIDER_SITE_OTHER): Payer: Self-pay | Admitting: Internal Medicine

## 2022-04-07 ENCOUNTER — Ambulatory Visit (INDEPENDENT_AMBULATORY_CARE_PROVIDER_SITE_OTHER): Payer: Medicare Other | Admitting: Internal Medicine

## 2022-04-07 VITALS — BP 112/69 | HR 84 | Ht 63.0 in | Wt 143.0 lb

## 2022-04-07 DIAGNOSIS — R9431 Abnormal electrocardiogram [ECG] [EKG]: Secondary | ICD-10-CM

## 2022-04-07 DIAGNOSIS — I10 Essential (primary) hypertension: Secondary | ICD-10-CM

## 2022-04-07 DIAGNOSIS — R079 Chest pain, unspecified: Secondary | ICD-10-CM

## 2022-04-07 LAB — ECG 12-LEAD
Atrial Rate: 60 {beats}/min
IHS MUSE NARRATIVE AND IMPRESSION: NORMAL
P Axis: 61 degrees
P-R Interval: 176 ms
Q-T Interval: 414 ms
QRS Duration: 90 ms
QTC Calculation (Bezet): 414 ms
R Axis: 61 degrees
T Axis: 44 degrees
Ventricular Rate: 60 {beats}/min

## 2022-04-07 NOTE — Progress Notes (Signed)
[facility] CONSULTATION    I had the pleasure of seeing Ms. Rauth today for cardiovascular evaluation. She is a pleasant 76 y.o. female with a history of hypertension, hyperlipidemia who presents for follow-up.  Patient complains of central chest pain for the last few days.  Symptoms occur at rest, no associated shortness of breath.  She denies palpitations, dizziness, syncope.  Nuclear stress test was ordered earlier this year but she has not yet done the stress test.  She is compliant with medications.  No other complaints.          PAST MEDICAL HISTORY: She has a past medical history of Breast cancer (2014), Exercise-induced angina, GERD (gastroesophageal reflux disease), Headache, Hyperlipidemia, Hypertension, Hypothyroidism, Low back pain, Malignant neoplasm of overlapping sites of right female breast (10/19/2015), Syncope and collapse, and Vertigo. She has a past surgical history that includes Mastectomy (Right, 2015); Hysterectomy; EGD, BIOPSY (N/A, 07/21/2020); COLONOSCOPY, POLYPECTOMY (N/A, 07/21/2020); Colonoscopy (2021); LAPAROSCOPY, DIAGNOSTIC (N/A, 05/22/2021); EXPLORATORY LAPAROTOMY (N/A, 05/22/2021); and LAPAROTOMY, COLECTOMY, RIGHT (Right, 05/22/2021).        MEDICATIONS:   Current Outpatient Medications   Medication Sig Dispense Refill    acetaminophen (TYLENOL) 325 MG tablet Take 2 tablets (650 mg) by mouth every 6 (six) hours as needed for Pain 30 tablet 0    amLODIPine (NORVASC) 10 MG tablet Take 1 tablet (10 mg total) by mouth daily 90 tablet 1    anastrozole (ARIMIDEX) 1 MG tablet Take 1 tablet (1 mg total) by mouth every morning 30 tablet 0    Apoaequorin (PREVAGEN PO) Take by mouth daily      aspirin 81 MG chewable tablet Chew 1 tablet (81 mg total) by mouth daily 90 tablet 0    desonide (DESOWEN) 0.05 % ointment Apply topically 2 (two) times daily 15 g 0    diclofenac Sodium (VOLTAREN) 1 % Gel topical gel Apply 2 g topically 4 (four) times daily 50 g 0    ezetimibe  (ZETIA) 10 MG tablet Take 1 tablet (10 mg total) by mouth daily 90 tablet 3    Ferrous Sulfate (IRON PO) Take by mouth      gabapentin (NEURONTIN) 100 MG capsule Take 1 capsule (100 mg total) by mouth 2 (two) times daily as needed (Neuropathy) 60 capsule 0    guaiFENesin (MUCINEX) 600 MG 12 hr tablet Take 2 tablets (1,200 mg total) by mouth 2 (two) times daily 14 tablet 0    levothyroxine (SYNTHROID) 25 MCG tablet Take 1 tablet (25 mcg total) by mouth every morning 30 tablet 0    lisinopril (ZESTRIL) 40 MG tablet Take 1 tablet (40 mg total) by mouth daily 90 tablet 1    magnesium oxide (MAG-OX) 400 MG tablet Take 1 tablet (400 mg total) by mouth daily 30 tablet 0    meclizine (ANTIVERT) 25 MG tablet Take 1 tablet (25 mg) by mouth 3 (three) times daily as needed for Dizziness 10 tablet 0    mirtazapine (REMERON) 15 MG tablet Take 1 tablet (15 mg total) by mouth nightly 30 tablet 0    Multiple Vitamins-Minerals (MULTIVITAMIN WITH MINERALS) tablet Take 1 tablet by mouth every morning      ondansetron (ZOFRAN-ODT) 4 MG disintegrating tablet Take 1 tablet (4 mg) by mouth every 6 (six) hours as needed for Nausea 12 tablet 0    pantoprazole (PROTONIX) 40 MG tablet Take 1 tablet (40 mg total) by mouth every morning before breakfast 30 tablet 0  rosuvastatin (CRESTOR) 40 MG tablet Take 1 tablet (40 mg) by mouth daily 30 tablet 0    traMADol (ULTRAM) 50 MG tablet Take 1 tablet (50 mg total) by mouth every 6 (six) hours as needed for Pain 30 tablet 0    vitamin B-1 (THIAMINE) 100 MG tablet Take 1 tablet (100 mg) by mouth every morning       No current facility-administered medications for this visit.           ALLERGIES:   Allergies   Allergen Reactions    Morphine Itching    Morphine And Related Itching and Nausea And Vomiting     dizzy         FAMILY HISTORY: Her family history is not on file.      SOCIAL HISTORY: She reports that she has never smoked. She has never used smokeless tobacco. She reports that she does not  currently use alcohol. She reports that she does not use drugs.      REVIEW OF SYSTEMS: All other systems reviewed and negative except as above.         PHYSICAL EXAMINATION  General Appearance:  A well-appearing female in no acute distress.    Vital Signs: BP 112/69 (BP Site: Left arm, Patient Position: Sitting, Cuff Size: Large)   Pulse 84   Ht 1.6 m (5\' 3" )   Wt 64.9 kg (143 lb)   SpO2 97%   BMI 25.33 kg/m    HEENT: Sclera anicteric, conjunctiva without pallor, moist mucous membranes, normal dentition. No arcus.   Neck:  Supple without jugular venous distention. Thyroid nonpalpable. Normal carotid upstrokes without bruits.   Chest: Clear to auscultation bilaterally with good air movement and respiratory effort and no wheezes, rales, or rhonchi   Cardiovascular: Normal S1 and physiologically split S2 without murmurs. No gallops or rub. PMI of normal size and nondisplaced.   Abdomen: Soft, nontender, nondistended, with normoactive bowel sounds. No organomegaly.  No pulsatile masses, or bruits.   Extremities: Warm without edema  Skin: No rash, xanthoma or xanthelasma.   Neuro: Alert and oriented x3. Grossly intact. Strength is symmetrical. Normal mood and affect.           ECG: Today, I have independently reviewed the tracing, sinus rhythm, borderline LVH, nonspecific T changes.            LABS:   Lab Results   Component Value Date    WBC 6.36 02/15/2022    HGB 10.3 (L) 02/15/2022    HCT 34.0 (L) 02/15/2022    PLT 328 02/15/2022    NA 143 02/15/2022    K 4.3 02/15/2022    MG 2.3 02/15/2022    BUN 10.0 02/15/2022    CREAT 1.1 (H) 02/15/2022    GLU 89 02/15/2022    AST 23 02/15/2022    ALT 14 02/15/2022    HGBA1C 5.4 01/14/2022    TSH 0.56 03/08/2021    TROPI 3.4 02/15/2022    DDIMER 3.21 (H) 10/15/2021    BNP 129 02/15/2022     Recent Labs     01/15/22  0450 03/09/21  0554 09/28/20  0530 05/29/20  0516 01/28/20  0549   Cholesterol 226* 196 216* 211*  213* 182   Triglycerides 62 50 93 91  91 82   HDL 61 61 61  61  60 47   LDL Calculated 153* 125* 136* 132*  135* 119*          ECHOCARDIOGRAM: 01/28/2020  Summary    * Left ventricular ejection fraction is normal with an estimated ejection  fraction of 60-65%.    * Left ventricular wall thickness is mildly increased.    * No significant valvular dysfunction.           Nuclear stress test 04/17/2019    Summary    1. Normal stress and rest myocardial perfusion study without evidence of  inducible ischemia or scar.    2. Normal left ventricular function with calculated LVEF 77 %.    3. No prior studies are available for comparison.        IMPRESSION/RECOMMENDATIONS:     Chest pain -at rest, no obvious exertional component.  Nonspecific T changes on ECG today.  Will further evaluate with exercise nuclear stress test and echocardiogram.    HTN -well-controlled.  Occasional dizziness which appears to be chronic.  Patient advised to check blood pressure at home when she feels dizzy.  If blood pressure is low or too tightly controlled, will need to decrease doses of antihypertensives.  She is currently taking amlodipine 10 mg p.o. daily and lisinopril 40 mg p.o. daily.  Stable renal function, potassium 4.1 BUN 10 creatinine 1.1.  Echocardiogram 01/2020 showed preserved ejection fraction 60 to 65% with mild LVH.    HLD -poorly controlled, LDL 150 03 January 2022.  She is uncertain whether she is taking her statin.  Advised importance of compliance with statin, she has been maintained on rosuvastatin 40 mg p.o. daily.  Normal AST and ALT 23 and 14.  Counseled on low-cholesterol diet.  Continue with regular exercise.    Anemia -hemoglobin 10.3, normal platelets 328.  Advised further evaluation and management per PCP.  She agrees to follow-up.    Follow-up in 6 weeks.        Rich Number, MD  04/07/2022, 11:59 AM  [facility]  [phone]

## 2022-04-11 ENCOUNTER — Ambulatory Visit: Admission: RE | Admit: 2022-04-11 | Payer: Medicare Other | Source: Ambulatory Visit

## 2022-04-12 ENCOUNTER — Other Ambulatory Visit (INDEPENDENT_AMBULATORY_CARE_PROVIDER_SITE_OTHER): Payer: Medicare Other

## 2022-04-12 ENCOUNTER — Ambulatory Visit: Payer: Medicare Other

## 2022-04-18 ENCOUNTER — Telehealth (INDEPENDENT_AMBULATORY_CARE_PROVIDER_SITE_OTHER): Payer: Self-pay | Admitting: Internal Medicine

## 2022-04-18 NOTE — Telephone Encounter (Signed)
Returned patient's call, but I had to LVM as she was unavailable.  I advised her to call back when she becomes available.

## 2022-04-18 NOTE — Telephone Encounter (Signed)
Patient reached out requesting a call back regarding test results.    Patient can be reached at [phone]      [facility]  [facility]

## 2022-05-04 ENCOUNTER — Encounter (INDEPENDENT_AMBULATORY_CARE_PROVIDER_SITE_OTHER): Payer: Self-pay | Admitting: Internal Medicine

## 2022-05-06 ENCOUNTER — Other Ambulatory Visit: Payer: Self-pay | Admitting: Internal Medicine

## 2022-05-06 DIAGNOSIS — R519 Headache, unspecified: Secondary | ICD-10-CM

## 2022-05-10 ENCOUNTER — Other Ambulatory Visit: Payer: Self-pay | Admitting: Internal Medicine

## 2022-05-10 DIAGNOSIS — N2889 Other specified disorders of kidney and ureter: Secondary | ICD-10-CM

## 2022-05-12 ENCOUNTER — Observation Stay: Payer: Medicare Other

## 2022-05-12 ENCOUNTER — Emergency Department: Payer: Medicare Other

## 2022-05-12 ENCOUNTER — Observation Stay
Admission: EM | Admit: 2022-05-12 | Discharge: 2022-05-12 | Disposition: A | Discharge status: Home / Self Care | Payer: Medicare Other | Attending: Internal Medicine | Admitting: Internal Medicine

## 2022-05-12 DIAGNOSIS — R262 Difficulty in walking, not elsewhere classified: Secondary | ICD-10-CM | POA: Insufficient documentation

## 2022-05-12 DIAGNOSIS — E039 Hypothyroidism, unspecified: Secondary | ICD-10-CM | POA: Insufficient documentation

## 2022-05-12 DIAGNOSIS — R200 Anesthesia of skin: Secondary | ICD-10-CM | POA: Insufficient documentation

## 2022-05-12 DIAGNOSIS — Z6829 Body mass index (BMI) 29.0-29.9, adult: Secondary | ICD-10-CM | POA: Insufficient documentation

## 2022-05-12 DIAGNOSIS — M79605 Pain in left leg: Secondary | ICD-10-CM | POA: Insufficient documentation

## 2022-05-12 DIAGNOSIS — M545 Low back pain, unspecified: Secondary | ICD-10-CM | POA: Insufficient documentation

## 2022-05-12 DIAGNOSIS — E7841 Elevated Lipoprotein(a): Secondary | ICD-10-CM | POA: Insufficient documentation

## 2022-05-12 DIAGNOSIS — R531 Weakness: Secondary | ICD-10-CM | POA: Insufficient documentation

## 2022-05-12 DIAGNOSIS — E663 Overweight: Secondary | ICD-10-CM | POA: Insufficient documentation

## 2022-05-12 DIAGNOSIS — Z853 Personal history of malignant neoplasm of breast: Secondary | ICD-10-CM | POA: Insufficient documentation

## 2022-05-12 DIAGNOSIS — I10 Essential (primary) hypertension: Secondary | ICD-10-CM | POA: Insufficient documentation

## 2022-05-12 DIAGNOSIS — M5432 Sciatica, left side: Secondary | ICD-10-CM

## 2022-05-12 DIAGNOSIS — E785 Hyperlipidemia, unspecified: Secondary | ICD-10-CM | POA: Insufficient documentation

## 2022-05-12 DIAGNOSIS — R29818 Other symptoms and signs involving the nervous system: Secondary | ICD-10-CM | POA: Insufficient documentation

## 2022-05-12 DIAGNOSIS — R251 Tremor, unspecified: Secondary | ICD-10-CM | POA: Insufficient documentation

## 2022-05-12 DIAGNOSIS — R42 Dizziness and giddiness: Principal | ICD-10-CM | POA: Insufficient documentation

## 2022-05-12 LAB — CBC AND DIFFERENTIAL
Absolute NRBC: 0 10*3/uL (ref 0.00–0.00)
Basophils Absolute Automated: 0.03 10*3/uL (ref 0.00–0.08)
Basophils Automated: 0.5 %
Eosinophils Absolute Automated: 0.09 10*3/uL (ref 0.00–0.44)
Eosinophils Automated: 1.5 %
Hematocrit: 34.1 % — ABNORMAL LOW (ref 34.7–43.7)
Hgb: 10.6 g/dL — ABNORMAL LOW (ref 11.4–14.8)
Immature Granulocytes Absolute: 0.01 10*3/uL (ref 0.00–0.07)
Immature Granulocytes: 0.2 %
Instrument Absolute Neutrophil Count: 3.69 10*3/uL (ref 1.10–6.33)
Lymphocytes Absolute Automated: 1.78 10*3/uL (ref 0.42–3.22)
Lymphocytes Automated: 29.2 %
MCH: 26.7 pg (ref 25.1–33.5)
MCHC: 31.1 g/dL — ABNORMAL LOW (ref 31.5–35.8)
MCV: 85.9 fL (ref 78.0–96.0)
MPV: 11 fL (ref 8.9–12.5)
Monocytes Absolute Automated: 0.5 10*3/uL (ref 0.21–0.85)
Monocytes: 8.2 %
Neutrophils Absolute: 3.69 10*3/uL (ref 1.10–6.33)
Neutrophils: 60.4 %
Nucleated RBC: 0 /100 WBC (ref 0.0–0.0)
Platelets: 359 10*3/uL — ABNORMAL HIGH (ref 142–346)
RBC: 3.97 10*6/uL (ref 3.90–5.10)
RDW: 16 % — ABNORMAL HIGH (ref 11–15)
WBC: 6.1 10*3/uL (ref 3.10–9.50)

## 2022-05-12 LAB — HIGH SENSITIVITY TROPONIN-I WITH DELTA
hs Troponin-I Delta: UNDETERMINED ng/L
hs Troponin-I: <2.7 ng/L

## 2022-05-12 LAB — URINALYSIS, REFLEX TO MICROSCOPIC EXAM IF INDICATED
Bilirubin, UA: NEGATIVE
Blood, UA: NEGATIVE
Glucose, UA: NEGATIVE
Ketones UA: NEGATIVE
Leukocyte Esterase, UA: NEGATIVE
Nitrite, UA: NEGATIVE
Protein, UR: NEGATIVE
Specific Gravity UA: 1.025 (ref 1.001–1.035)
Urine pH: 7 (ref 5.0–8.0)
Urobilinogen, UA: NEGATIVE mg/dL (ref 0.2–2.0)

## 2022-05-12 LAB — COMPREHENSIVE METABOLIC PANEL
ALT: 11 U/L (ref 0–55)
AST (SGOT): 25 U/L (ref 5–41)
Albumin/Globulin Ratio: 1.3 (ref 0.9–2.2)
Albumin: 4.1 g/dL (ref 3.5–5.0)
Alkaline Phosphatase: 81 U/L (ref 37–117)
Anion Gap: 10 (ref 5.0–15.0)
BUN: 11 mg/dL (ref 7.0–21.0)
Bilirubin, Total: 0.2 mg/dL (ref 0.2–1.2)
CO2: 26 mEq/L (ref 17–29)
Calcium: 9.4 mg/dL (ref 7.9–10.2)
Chloride: 109 mEq/L (ref 99–111)
Creatinine: 1.2 mg/dL — ABNORMAL HIGH (ref 0.4–1.0)
Globulin: 3.1 g/dL (ref 2.0–3.6)
Glucose: 97 mg/dL (ref 70–100)
Potassium: 4.3 mEq/L (ref 3.5–5.3)
Protein, Total: 7.2 g/dL (ref 6.0–8.3)
Sodium: 145 mEq/L (ref 135–145)
eGFR: 46.4 mL/min/{1.73_m2} — AB (ref >=60)

## 2022-05-12 LAB — LIPID PANEL
Cholesterol / HDL Ratio: 3.7 Index
Cholesterol: 265 mg/dL — ABNORMAL HIGH (ref 0–199)
HDL: 72 mg/dL (ref 40–9999)
LDL Calculated: 181 mg/dL — ABNORMAL HIGH (ref 0–99)
Triglycerides: 61 mg/dL (ref 34–149)
VLDL Calculated: 12 mg/dL (ref 10–40)

## 2022-05-12 LAB — HIGH SENSITIVITY TROPONIN-I: hs Troponin-I: <2.7 ng/L

## 2022-05-12 LAB — PT/INR
PT INR: 0.9 (ref 0.9–1.1)
PT: 10.5 s (ref 10.1–12.9)

## 2022-05-12 LAB — GLUCOSE WHOLE BLOOD - POCT
Whole Blood Glucose POCT: 94 mg/dL (ref 70–100)
Whole Blood Glucose POCT: 97 mg/dL (ref 70–100)

## 2022-05-12 LAB — APTT: PTT: 35 s (ref 27–39)

## 2022-05-12 MED ORDER — IOHEXOL 350 MG/ML IV SOLN
120.0000 mL | Freq: Once | INTRAVENOUS | Status: AC | PRN
Start: 2022-05-12 — End: 2022-05-12
  Administered 2022-05-12: 120 mL via INTRAVENOUS

## 2022-05-12 MED ORDER — POTASSIUM CHLORIDE CRYS ER 20 MEQ PO TBCR
0.0000 meq | EXTENDED_RELEASE_TABLET | ORAL | Status: DC | PRN
Start: 2022-05-12 — End: 2022-05-12

## 2022-05-12 MED ORDER — GLUCAGON 1 MG IJ SOLR (WRAP)
1.0000 mg | INTRAMUSCULAR | Status: DC | PRN
Start: 2022-05-12 — End: 2022-05-12

## 2022-05-12 MED ORDER — ONDANSETRON 4 MG PO TBDP
4.0000 mg | ORAL_TABLET | Freq: Four times a day (QID) | ORAL | Status: DC | PRN
Start: 2022-05-12 — End: 2022-05-12

## 2022-05-12 MED ORDER — POTASSIUM & SODIUM PHOSPHATES 280-160-250 MG PO PACK
2.0000 | PACK | ORAL | Status: DC | PRN
Start: 2022-05-12 — End: 2022-05-12

## 2022-05-12 MED ORDER — DEXTROSE 50 % IV SOLN
12.5000 g | INTRAVENOUS | Status: DC | PRN
Start: 2022-05-12 — End: 2022-05-12

## 2022-05-12 MED ORDER — MAGNESIUM SULFATE IN D5W 1-5 GM/100ML-% IV SOLN
1.0000 g | INTRAVENOUS | Status: DC | PRN
Start: 2022-05-12 — End: 2022-05-12

## 2022-05-12 MED ORDER — SODIUM CHLORIDE 0.9 % IV BOLUS
500.0000 mL | Freq: Once | INTRAVENOUS | Status: AC
Start: 2022-05-12 — End: 2022-05-12
  Administered 2022-05-12: 500 mL via INTRAVENOUS

## 2022-05-12 MED ORDER — ANASTROZOLE 1 MG PO TABS
1.0000 mg | ORAL_TABLET | Freq: Every morning | ORAL | Status: DC
  Administered 2022-05-12: 1 mg via ORAL
  Filled 2022-05-12: qty 1

## 2022-05-12 MED ORDER — ASPIRIN 81 MG PO CHEW
81.0000 mg | CHEWABLE_TABLET | Freq: Every day | ORAL | Status: DC
Start: 2022-05-12 — End: 2022-05-12
  Administered 2022-05-12: 81 mg via ORAL
  Filled 2022-05-12: qty 1

## 2022-05-12 MED ORDER — GLUCOSE 40 % PO GEL (WRAP)
15.0000 g | ORAL | Status: DC | PRN
Start: 2022-05-12 — End: 2022-05-12

## 2022-05-12 MED ORDER — HEPARIN SODIUM (PORCINE) 5000 UNIT/ML IJ SOLN
5000.0000 [IU] | Freq: Two times a day (BID) | INTRAMUSCULAR | Status: DC
Start: 2022-05-12 — End: 2022-05-12
  Administered 2022-05-12: 5000 [IU] via SUBCUTANEOUS
  Filled 2022-05-12: qty 1

## 2022-05-12 MED ORDER — ONDANSETRON HCL 4 MG/2ML IJ SOLN
4.0000 mg | Freq: Four times a day (QID) | INTRAMUSCULAR | Status: DC | PRN
Start: 2022-05-12 — End: 2022-05-12

## 2022-05-12 MED ORDER — LISINOPRIL 10 MG PO TABS
20.0000 mg | ORAL_TABLET | Freq: Every day | ORAL | Status: DC
Start: 2022-05-12 — End: 2022-05-12
  Administered 2022-05-12: 20 mg via ORAL
  Filled 2022-05-12: qty 2

## 2022-05-12 MED ORDER — AMLODIPINE BESYLATE 5 MG PO TABS
10.0000 mg | ORAL_TABLET | Freq: Every day | ORAL | Status: DC
Start: 2022-05-12 — End: 2022-05-12
  Administered 2022-05-12: 10 mg via ORAL
  Filled 2022-05-12: qty 2

## 2022-05-12 MED ORDER — SODIUM CHLORIDE 0.9 % IV SOLN
INTRAVENOUS | Status: DC
Start: 2022-05-12 — End: 2022-05-12

## 2022-05-12 MED ORDER — MECLIZINE HCL 12.5 MG PO TABS
25.0000 mg | ORAL_TABLET | Freq: Three times a day (TID) | ORAL | Status: DC | PRN
Start: 2022-05-12 — End: 2022-05-12

## 2022-05-12 MED ORDER — MELATONIN 3 MG PO TABS
3.0000 mg | ORAL_TABLET | Freq: Every evening | ORAL | Status: DC | PRN
Start: 2022-05-12 — End: 2022-05-12

## 2022-05-12 MED ORDER — ACETAMINOPHEN 650 MG RE SUPP
650.0000 mg | Freq: Four times a day (QID) | RECTAL | Status: DC | PRN
Start: 2022-05-12 — End: 2022-05-12

## 2022-05-12 MED ORDER — ROSUVASTATIN CALCIUM 10 MG PO TABS
40.0000 mg | ORAL_TABLET | Freq: Every evening | ORAL | Status: DC
Start: 2022-05-12 — End: 2022-05-12

## 2022-05-12 MED ORDER — POTASSIUM CHLORIDE 10 MEQ/100ML IV SOLN
10.0000 meq | INTRAVENOUS | Status: DC | PRN
Start: 2022-05-12 — End: 2022-05-12

## 2022-05-12 MED ORDER — LEVOTHYROXINE SODIUM 25 MCG PO TABS
25.0000 ug | ORAL_TABLET | Freq: Every morning | ORAL | Status: DC
Start: 2022-05-13 — End: 2022-05-12

## 2022-05-12 MED ORDER — ACETAMINOPHEN 325 MG PO TABS
650.0000 mg | ORAL_TABLET | Freq: Four times a day (QID) | ORAL | Status: DC | PRN
Start: 2022-05-12 — End: 2022-05-12
  Administered 2022-05-12: 650 mg via ORAL
  Filled 2022-05-12: qty 2

## 2022-05-12 MED ORDER — NALOXONE HCL 0.4 MG/ML IJ SOLN (WRAP)
0.2000 mg | INTRAMUSCULAR | Status: DC | PRN
Start: 2022-05-12 — End: 2022-05-12

## 2022-05-12 MED ORDER — DEXTROSE 10 % IV BOLUS
12.5000 g | INTRAVENOUS | Status: DC | PRN
Start: 2022-05-12 — End: 2022-05-12

## 2022-05-12 NOTE — ED Notes (Signed)
Patient difficulty IV. Left arm is the only arm to use for IV/BP usage. Ultrasound was used to access IV. Right is off limits due to Mastectomy

## 2022-05-12 NOTE — PT Progress Note (Signed)
Physical Therapy Evaluation  Emily [name]      Post Acute Care Therapy Recommendations:     Discharge Recommendations:  Home with supervision, Home with home health PT      DME needs IF patient is discharging home: Front wheel walker    Therapy discharge recommendations may change with patient status.  Please refer to most recent note for up-to-date recommendations.           [facility]  [address]  [place], [place] [address]  [phone]    Physical Therapy Evaluation    Patient: Emily [name] MRN: [id number]   Unit: [facility] Bed: E 33/E33    Time of Treatment: Time Calculation  PT Received On: 05/12/22  Start Time: 1339  Stop Time: 1356  Time Calculation (min): 17 min    Consult received for Emily Ehrich Septer for PT evaluation and treatment.  Patient's medical condition is appropriate for Physical Therapy  intervention at this time.    Interpreter utilized: no, not indicated      Assessment   Emily [name] is a 76 y.o. female admitted 05/12/2022 due to dizziness and L sided weakness. CT neg for acute abnormality, MRI pending. Pt has a PMH of with a PMHx of hypertension, hyperlipidemia, history of breast cancer, hypothyroidism and GERD.  Pt in bed upon arrival and agreeable to PT. Orthostatics taken (see below), negative, but pt did report increased dizziness in standing. Pt lives alone in apartment w/ 10 STE. At baseline, pt is independent with ambulation and ADLs, driving. Today, pt required minA for supine to sit transfer. Pt then able to stand w/ minA. Pt ambulated 20' w/ HHA. Gait trial with RW done, pt able to ambulate 20' w/ SBA. Pt is currently functioning below her baseline and would benefit from skilled PT during her stay. D/c recommendation home w/ superv and HHPT.         Complexity Level Hx and Co  morbidites Examination Clinical Decision Making Clinical Presentation   Low  no impact 1-2 elements Limited options Stable       PMP -  Progressive Mobility Protocol   PMP Activity: Step 7 - Walks out of Room  Distance Walked (ft) (Step 6,7): 40 Feet       Rehabilitation Potential: Good       Interdisciplinary Communication: RN      Plan     Plan  Risks/Benefits/POC Discussed with Pt/Family: With patient  Treatment/Interventions: Exercise;Gait training;Stair training;Functional transfer training;LE strengthening/ROM;Endurance training  PT Frequency: 2-3x/wk         Medical Diagnosis: Dizziness [R42]    History of Present Illness: Emily [name] is a 76 y.o. female admitted on  05/12/2022 with "with a PMHx of hypertension, hyperlipidemia, history of breast cancer, hypothyroidism, GERD presented to the emergency room with complaints of dizziness and chronic vertigo along with left-sided weakness.   "        Patient Active Problem List   Diagnosis    Malignant neoplasm of overlapping sites of right female breast    Hypertension    Mixed hyperlipidemia    Hypothyroid    Right sided numbness    Left sided numbness    COVID-19 ruled out by laboratory testing    Acute colitis    Right lower quadrant abdominal pain    Abnormal CT scan, gastrointestinal tract    History of hypothyroidism    History of breast cancer    History  of colonic polyps    Dizziness       Past Medical History:   Diagnosis Date    Breast cancer 2014    right breast mastectomy    Exercise-induced angina     negative work up    GERD (gastroesophageal reflux disease)     Headache     resolved    Hyperlipidemia     Hypertension     Hypothyroidism     Low back pain     Malignant neoplasm of overlapping sites of right female breast 10/19/2015    Syncope and collapse     Chronic Vertigo    Vertigo        Past Surgical History:   Procedure Laterality Date    COLONOSCOPY  2021    COLONOSCOPY, POLYPECTOMY N/A 07/21/2020    Procedure: COLONOSCOPY, POLYPECTOMY;  Surgeon: Einar Grad, MD;  Location: [facility];  Service: Gastroenterology;  Laterality: N/A;    EGD, BIOPSY N/A 07/21/2020     Procedure: EGD, BIOPSY;  Surgeon: Einar Grad, MD;  Location: [facility];  Service: Gastroenterology;  Laterality: N/A;    EXPLORATORY LAPAROTOMY N/A 05/22/2021    Procedure: EXPLORATORY LAPAROTOMY;  Surgeon: Delena Serve, MD;  Location: [facility];  Service: General;  Laterality: N/A;    HYSTERECTOMY      LAPAROSCOPY, DIAGNOSTIC N/A 05/22/2021    Procedure: LAPAROSCOPY, DIAGNOSTIC;  Surgeon: Delena Serve, MD;  Location: [facility];  Service: General;  Laterality: N/A;    LAPAROTOMY, COLECTOMY, RIGHT Right 05/22/2021    Procedure: LAPAROTOMY, COLECTOMY, RIGHT;  Surgeon: Delena Serve, MD;  Location: [facility];  Service: General;  Laterality: Right;    MASTECTOMY Right 2015       Tests/Labs:  Lab Results   Component Value Date/Time    HGB 10.6 (L) 05/12/2022 07:38 AM    HCT 34.1 (L) 05/12/2022 07:38 AM    K 4.3 05/12/2022 07:38 AM    NA 145 05/12/2022 07:38 AM    INR 0.9 05/12/2022 07:38 AM    TROPI <2.7 05/12/2022 10:17 AM    TROPI Unable toCalc. 05/12/2022 10:17 AM    TROPI <2.7 05/12/2022 07:38 AM    TROPI 3.4 02/15/2022 11:36 AM    TROPI <2.7 01/14/2022 05:35 PM    TROPI Unable toCalc. 01/14/2022 05:35 PM    TROPI 0.01 07/15/2009 09:40 AM         Imaging   CTA  Head & Neck    Result Date: 05/12/2022   1.No occlusion or hemodynamically significant stenosis of the major intracranial arteries. 2.No hemodynamically significant carotid stenosis. 3.New mild narrowing of distal V2 segment of the left vertebral artery at left C2 neural foramen. Unchanged mild focal narrowing of distal V2 segments of right and left vertebral artery at C4 and C1 level respectively due to degenerative disc disease. 4.CT perfusion without a or evidence of core infarct. 5.Right thyroid lobe and isthmic nodules as detailed above are unchanged. Correlation with dedicated thyroid ultrasound if not previously performed is recommended. 6.Please see body of report for detailed discussion. Comment: Assessment  for acute infarct, threshold for ischemia tissue volume is Tmax > 6 s. Threshold for infarct core volume estimated CBF <30 percent. Threshold for poor collateral flow or severe delayed perfusion is Tmax>10 sec. Nelya Ebadirad 05/12/2022 8:27 AM    CT Perfusion Brain    Result Date: 05/12/2022   1.No occlusion or hemodynamically significant stenosis of the major  intracranial arteries. 2.No hemodynamically significant carotid stenosis. 3.New mild narrowing of distal V2 segment of the left vertebral artery at left C2 neural foramen. Unchanged mild focal narrowing of distal V2 segments of right and left vertebral artery at C4 and C1 level respectively due to degenerative disc disease. 4.CT perfusion without a or evidence of core infarct. 5.Right thyroid lobe and isthmic nodules as detailed above are unchanged. Correlation with dedicated thyroid ultrasound if not previously performed is recommended. 6.Please see body of report for detailed discussion. Comment: Assessment for acute infarct, threshold for ischemia tissue volume is Tmax > 6 s. Threshold for infarct core volume estimated CBF <30 percent. Threshold for poor collateral flow or severe delayed perfusion is Tmax>10 sec. Nelya Ebadirad 05/12/2022 8:27 AM    CT Head WO Contrast    Result Date: 05/12/2022  No acute intracranial abnormality. Maylon Cos 05/12/2022 7:15 AM     Social History: (Per family/care provider, if patient is not able to give accurate report)  Lives alone in a apartment.  Entry Steps: 10   Rails: y        Inside steps: 0  Rails: 0  Equipment at home:  No equipment needs  Prior Level of Function:  independent w/ ambulation and ADLS              Cognition: good                         Mobility/Locomotion: independent              Feeding: independent              Grooming: independent              Bathing: independent              Dressing: independent              Toileting: independent     Subjective   "I am dizzy"  Patient is agreeable to  participation in the therapy session. Nursing clears patient for therapy.  Patient's Goal:  none stated  Pain: pt denies pain      Objective     Precautions/ Contraindications:   Precautions  Weight Bearing Status: no restrictions  Other Precautions: falls, dizzy    Patient is in bed with telemetry, intravenous (IV), pulse oximeter, and blood pressure cuff in place.       Observation of patient/vitals:     Blood Pressure   Supine 167/69   Sitting 163/69   Standing  149/64                 Orientation/Cognition:  Alert and Oriented x 4  Cognition: WFL      Musculoskeletal Examination:      ROM Strength   RLE WFL 4+/5   LLE WFL 4-/5     Sensation: intact  Coordination: fair     Functional Mobility:   Supine to sit: minA  Sit to Supine: SBA  Sit to stand: minA  Stand to sit: CGA    Ambulation:     Weightbearing: no restrictinos   Assistance level: HHA/ SBA   Distance: 40'   Assistive Device: RW   Gait Deviations: decreased gait speed       Balance:  Static Sit: good  Dynamic Sit: good  Static Stand: godo  Dynamic Stand: fair    Endurance: good    Participation:  good    Education:  Educated the patient to role of physical therapy, plan of care, goals  of therapy, rationale for progressing mobility and safety with mobility and ADLs, energy conservation techniques, and home safety.    RN notified of session outcome and that patient was left in bed with all needs met and equipment intact.   Safety measures include: handoff to nurse/clin tech/ unit secretary completed, oriented to call bell and placed within reach, personal items within reach, assistive device positioned out of reach, and bed placed in lowest position.   Mobility and ADL status posted at bedside and within E.M.R.               Goals  Goal Formulation: With patient  Time for Goal Acheivement: 5 visits  Pt Will Go Supine To Sit: with supervision  Pt Will Perform Sit To Supine: with supervision  Pt Will Perform Sit to Stand: modified independent  Pt Will  Ambulate: 51-100 feet;with rolling walker;modified independent  Pt Will Go Up / Down Stairs: 6-10 stairs;modified independent       Modified Rankin Scale:      I am certified in mRS: Yes  mRS Assessment Source: Patient  mRS Value: 2 - Slight disability    Therapist PPE during session gloves     Signature:  Lennox Pippins, PT  05/12/2022  2:16 PM  Phone: [phone]     (For scheduling questions, please contact rehab tech [phone])    Attention MD:   Thank you for allowing [place] to participate in the care of [facility]. Regulations from the [employer] for Medicare and Medicaid Services (CMS) require your review and approval of this plan of care.     Please cosign this note indicating you are in agreement with the Therapy Plan of Care so we may initiate the therapy treatment plan.

## 2022-05-12 NOTE — PT Progress Note (Signed)
Physical Therapy Attempt Note    Patient: Emily [name]  ZOX:[id number]    Unit: E 33/E33    Patient not seen for physical therapy secondary to pt off the floor for imaging. PT to continue to follow as schedule allows.         Signature:   Lennox Pippins, PT  05/12/2022  1:13 PM  Phone: [phone]    (For scheduling questions, please contact rehab tech [phone])

## 2022-05-12 NOTE — Plan of Care (Signed)
Problem: Day of Admission - Stroke  Goal: Core/Quality measure requirements - Admission  Outcome: Progressing  Flowsheets (Taken 05/12/2022 1442)  Core/Quality measure requirements - Admission:   Document NIH Stroke Scale on admission   Document nursing swallow/dysphagia screen on admission. If patient fails, keep patient NPO (follow your hospital protocol on swallowing screening).   VTE Prevention: Ensure anticoagulant(s) administered and/or anti-embolism stockings/devices documented as ordered   Ensure antithrombotic administered or contraindication documented by LIP     Problem: Every Day - Stroke  Goal: Core/Quality measure requirements - Daily  Outcome: Progressing  Flowsheets (Taken 05/12/2022 1442)  Core/Quality measure requirements - Daily:   VTE Prevention: Ensure anticoagulant(s) administered and/or anti-embolism stockings/devices documented by end of day 2   Ensure antithrombotic administered or contraindication documented by LIP by end of day 2   Once lipid panel has resulted, check LDL. Contact provider for statin order if LDL > 70 (or ensure contraindication documented by LIP).  Goal: Neurological status is stable or improving  Outcome: Progressing  Flowsheets (Taken 05/12/2022 1442)  Neurological status is stable or improving:   Monitor/assess/document neurological assessment (Stroke: every 4 hours)   Monitor/assess NIH Stroke Scale   Re-assess NIH Stroke Scale for any change in status   Observe for seizure activity and initiate seizure precautions if indicated   Perform CAM Assessment  Goal: Stable vital signs and fluid balance  Outcome: Progressing  Flowsheets (Taken 05/12/2022 1442)  Stable vital signs and fluid balance:   Position patient for maximum circulation/cardiac output   Monitor and assess vitals every 4 hours or as ordered and hemodynamic parameters   Monitor intake and output. Notify LIP if urine output is < 30 mL/hour.  Goal: Patient will maintain adequate oxygenation  Outcome:  Progressing  Flowsheets (Taken 05/12/2022 1442)  Patient will maintain adequate oxygenation:   Suction secretions as needed   Maintain SpO2 of greater than 92%  Goal: Patient's risk of aspiration will be minimized  Outcome: Progressing  Flowsheets (Taken 05/12/2022 1442)  Patient's risk of aspiration will be minimized:   Complete new dysphagia screen for any change in status: Keep patient NPO if patient fails   Place swallow precaution signage above bed   Monitor/assess for signs of aspiration (tachypnea, cough, wheezing, clearing throat, hoarseness after eating, decrease in SaO2   Order modified texture diet as recommend by Speech Pathologist   Thicken liquids as recommended by Speech Pathologist   Keep head of bed up a minimum of 30 degrees when hemodynamically stable   Assess and monitor ability to swallow   HOB up 90 degrees to eat if unable to be OOB   Place patient up in chair to eat, if possible  Goal: Nutritional intake is adequate  Outcome: Progressing  Flowsheets (Taken 05/12/2022 1442)  Nutritional intake is adequate:   Assist patient with meals/food selection   Allow adequate time for meals   Monitor daily weights   Encourage/perform oral hygiene as appropriate   Encourage/administer dietary supplements as ordered (i.e. tube feed, TPN, oral, OGT/NGT, supplements)  Goal: Elimination patterns are normal or improving  Outcome: Progressing  Flowsheets (Taken 05/12/2022 1442)  Elimination patterns are normal or improving:   Report abnormal assessment to physician   Anticipate/assist with toileting needs   Assess for normal bowel sounds   Monitor for abdominal distension   Monitor for abdominal discomfort   Assess for signs and symptoms of bleeding.  Report signs of bleeding to physician   Administer treatments as ordered  Goal:  Mobility/Activity is maintained at optimal level for patient  Outcome: Progressing  Flowsheets (Taken 05/12/2022 1442)  Mobility/activity is maintained at optimal level for patient:    Increase mobility as tolerated/progressive mobility   Encourage independent activity per ability   Perform active/passive ROM   Maintain proper body alignment   Assess for changes in respiratory status, level of consciousness and/or development of fatigue   Plan activities to conserve energy, plan rest periods  Goal: Skin integrity is maintained or improved  Outcome: Progressing  Flowsheets (Taken 05/12/2022 1442)  Skin integrity is maintained or improved:   Assess Braden Scale every shift   Turn or reposition patient every 2 hours or as needed unless able to reposition self   Relieve pressure to bony prominences   Avoid shearing   Keep skin clean and dry   Collaborate with Wound, Ostomy, and Continence Nurse   Monitor patient's hygiene practices   Encourage use of lotion/moisturizer on skin   Keep head of bed 30 degrees or less (unless contraindicated)  Goal: Neurovascular status is stable or improving  Outcome: Progressing  Flowsheets (Taken 05/12/2022 1442)  Neurovascular status is stable or improving:   Monitor/assess neurovascular status (pulses, capillary refill, pain, paresthesia, presence of edema)   Monitor/assess for signs of Venous Thrombus Emboli (edema of calf/thigh redness, pain)   Monitor/assess site of invasive procedure for signs of bleeding  Goal: Effective coping demonstrated  Outcome: Progressing  Flowsheets (Taken 05/12/2022 1442)  Effective coping demonstrated: Assess/report to LIP uncontrolled anxiety, depression, or ineffective coping  Goal: Will be able to express needs and understand communication  Outcome: Progressing  Flowsheets (Taken 05/12/2022 1442)  Able to express needs and understand communication:   Provide alternative method of communication if needed   Consult/collaborate with Speech Language Pathology (SLP)   Consult/collaborate with Case Management/Social Work   Include patient care companion in decisions related to communication     Problem: Day of Discharge - Stroke  Goal: Able  to express understanding of discharge instructions and stroke education  Outcome: Progressing  Flowsheets (Taken 05/12/2022 1442)  Able to express understanding of discharge instructions and stroke education:   Provide alternative method of communication if needed   Consult/collaborate with Case Management/Social Work   Include patient care companion in decisions related to communication   Patient/patient care companion demonstrates understanding on disease process, treatment plan, medications and discharge plan  Goal: Core/Quality measures - Discharge  Outcome: Progressing  Flowsheets (Taken 05/12/2022 1442)  Core/Quality measures - Discharge:   Document discharge NIH Stroke Scale   Document Modified Rankin Scale (Trainer only)   Ensure antithrombotic ordered or contraindication documented by LIP   Ensure statin ordered or contraindication documented by LIP   If diagnosis or history of A-fib/A-flutter, ensure anticoagulation ordered or contraindication documented by LIP

## 2022-05-12 NOTE — EDIE (Signed)
[facility]?NOTIFICATION?05/12/2022 06:47?Emily [name], Emily [name]?MRN: [id number]    [place] [facility]'s patient encounter information:   VWU:?[id number]  Account [id number]  Billing Account [id number]      Criteria Met      5 ED Visits in 12 Months    Security and Safety  No Security Events were found.  ED Care Guidelines  There are currently no ED Care Guidelines for this patient. Please check your facility's medical records system.        Prescription Monitoring Program  000??- Narcotic Use Score  000??- Sedative Use Score  000??- Stimulant Use Score  000??- Overdose Risk Score  - All Scores range from 000-999 with 75% of the population scoring < 200 and on 1% scoring above 650  - The last digit of the narcotic, sedative, and stimulant score indicates the number of active prescriptions of that type  - Higher Use scores correlate with increased prescribers, pharmacies, mg equiv, and overlapping prescriptions  - Higher Overdose Risk Scores correlate with increased risk of unintentional overdose death   Concerning or unexpectedly high scores should prompt a review of the PMP record; this does not constitute checking PMP for prescribing purposes.    E.D. Visit Count (12 mo.)  Facility Visits   [place] [facility] 8   Total 8   Note: Visits indicate total known visits.     Recent Emergency Department Visit Summary  Date Facility [facility] Type Diagnoses or Chief Complaint    May 12, 2022  [place] [facility]  [place]  [place]  Emergency      Dizziness, weakness, left side uneasy      Feb 15, 2022  [place] [facility]  [place]  [place]  Emergency      Chest pain, unspecified      Generalized abdominal pain      Weakness      Generalized Weakness      Jan 14, 2022  [place] [facility]  [place]  [place]  Emergency      Unspecified symptoms and signs involving the nervous system      Dizziness and giddiness      Unspecified visual loss      Cerebrovascular Accident      L side numbness      Nov 30, 2021  [place] [facility]  [place]  [place]  Emergency      Pulmonary fibrosis, unspecified      Dizziness and giddiness      Fatigue      Weakness; Fatigue      Nov 17, 2021  [place] [facility]  [place]    Emergency      Calculus of gallbladder without cholecystitis without obstruction      Unspecified abdominal pain      Dysuria      Abdominal Pain      Nausea      Scar Tissue Pain      Pain in right side, breast cancer patient      Nov 11, 2021   [facility]  [place]    Emergency      Nontoxic single thyroid nodule      Paresthesia of skin      Chest pain, unspecified      Acute kidney failure, unspecified      Syncope and collapse      Dizziness and giddiness      Syncope  Chest Pain      chest pain,dizziness,weakness      Oct 15, 2021  [place] [facility]  [place]  [place]  Emergency      Other chest pain      Palpitations      palpitations shortness of breath      May 22, 2021  [place] [facility]  [place]  [place]  Emergency      Right lower quadrant pain      Vascular disorder of intestine, unspecified      Dizziness      Abdominal Pain      Nausea      Diarrhea      Abd Pain/Weakness        Recent Inpatient Visit Summary  Date Facility [facility] Type Diagnoses or Chief Complaint    Jan 14, 2022  [place] [facility]  [place]  [place]  Medical Surgical      Dizziness and giddiness      Unspecified symptoms and signs involving the nervous system      Unspecified visual loss      May 22, 2021  [place] [facility]  [place]  [place]  Medical Surgical      Estrogen receptor positive status [ER+]      Malignant neoplasm of overlapping sites of right female breast      Vascular disorder of intestine, unspecified      Right lower quadrant pain        Care Team  Provider Specialty Phone Fax Service Dates   [place], [facility] , DO Internal Medicine   Current    Rondel Baton, M.D. Internal Medicine   Current    DAVIDSON, Janet Berlin MD Judie Petit, MD Family Medicine: Adult Medicine [phone]  Current    Santa Lighter, M.D. Internal Medicine   Current    Cornelius Moras, NP Nurse Practitioner: Adult Health   Current    Sandria Bales Nurse Practitioner: Gerontology [phone]  Current      [facility]  This patient has registered at the [facility] Emergency Department   For more information visit: [email or web address]     PLEASE NOTE:     1.   Any care recommendations and other clinical information are provided as guidelines or for historical purposes only, and providers should exercise their own clinical judgment when providing care.    2.   You may only use this information for purposes of treatment, payment or health care operations activities, and subject to the limitations of applicable [facility] Policies.    3.   You should consult directly with the organization that provided a care guideline or other clinical history with any questions about additional information or accuracy or completeness of information provided.    ? 2023 [facility] - [email or web address]

## 2022-05-12 NOTE — Progress Notes (Signed)
Walker provided to patient.

## 2022-05-12 NOTE — Progress Notes (Signed)
HH Liaison notified PT recommends Home with home health PT/OT and walker.

## 2022-05-12 NOTE — ED Provider Notes (Addendum)
EMERGENCY DEPARTMENT NOTE     Patient initially seen and examined at   ED PHYSICIAN ASSIGNED       Date/Time Event User Comments    05/12/22 0650 Physician Assigned Leslee Home, MD assigned as Attending           ED MIDLEVEL (APP) ASSIGNED       None            HISTORY OF PRESENT ILLNESS   Translator Used : No    Chief Complaint: Dizziness and Extremity Weakness       76 y.o. female with past medical history as below presents with dizziness, difficulty walking, left lower back pain that radiates down her left leg.  Location of symptoms: see above  Onset of symptoms: yesterday evening from best I can tell but states she was last normal around 8 am   What was patient doing when symptoms started (Context):   Severity: severe  Timing: acute onset, constant  Activities that worsen symptoms: none  Activities that improve symptoms: none  Quality: patient states that she feels dizzy and is having difficulty walking as a result of this. She feels like she is leaning to the side. She feels "drunk". She states that this started in the evening. When I pressed her for more information, last normal, she states it was yesterday morning around 8 am to the best of her ability to remember. She also endorses some left lower back pain that radiates down her left leg. This was the first complaint that she mentioned to me. This started yesterday as well.   Radiation of symptoms: pain radiating down left leg  Associated signs and Symptoms: none   Are symptoms worsening? yes      Independent Historian (other than patient): No  Additional History Provided by Independent Historian:  MEDICAL HISTORY     Past Medical History:  Past Medical History:   Diagnosis Date    Breast cancer 2014    right breast mastectomy    Exercise-induced angina     negative work up    GERD (gastroesophageal reflux disease)     Headache     resolved    Hyperlipidemia     Hypertension     Hypothyroidism     Low back pain     Malignant  neoplasm of overlapping sites of right female breast 10/19/2015    Syncope and collapse     Chronic Vertigo    Vertigo        Past Surgical History:  Past Surgical History:   Procedure Laterality Date    COLONOSCOPY  2021    COLONOSCOPY, POLYPECTOMY N/A 07/21/2020    Procedure: COLONOSCOPY, POLYPECTOMY;  Surgeon: Einar Grad, MD;  Location: [facility];  Service: Gastroenterology;  Laterality: N/A;    EGD, BIOPSY N/A 07/21/2020    Procedure: EGD, BIOPSY;  Surgeon: Einar Grad, MD;  Location: [facility];  Service: Gastroenterology;  Laterality: N/A;    EXPLORATORY LAPAROTOMY N/A 05/22/2021    Procedure: EXPLORATORY LAPAROTOMY;  Surgeon: Delena Serve, MD;  Location: [facility];  Service: General;  Laterality: N/A;    HYSTERECTOMY      LAPAROSCOPY, DIAGNOSTIC N/A 05/22/2021    Procedure: LAPAROSCOPY, DIAGNOSTIC;  Surgeon: Delena Serve, MD;  Location: [facility];  Service: General;  Laterality: N/A;    LAPAROTOMY, COLECTOMY, RIGHT Right 05/22/2021    Procedure: LAPAROTOMY, COLECTOMY, RIGHT;  Surgeon: Delena Serve, MD;  Location: [facility];  Service: General;  Laterality: Right;    MASTECTOMY Right 2015       Social History:  Social History     Socioeconomic History    Marital status: Widowed   Tobacco Use    Smoking status: Never    Smokeless tobacco: Never   Vaping Use    Vaping Use: Never used   Substance and Sexual Activity    Alcohol use: Not Currently     Comment: occ    Drug use: No    Sexual activity: Not Currently       Family History:  Family History   Problem Relation Age of Onset    Breast cancer Neg Hx        Outpatient Medication:  Previous Medications    AMLODIPINE (NORVASC) 10 MG TABLET    Take 1 tablet (10 mg total) by mouth daily    ANASTROZOLE (ARIMIDEX) 1 MG TABLET    Take 1 tablet (1 mg total) by mouth every morning    ASPIRIN 81 MG CHEWABLE TABLET    Chew 1 tablet (81 mg total) by mouth daily    DICLOFENAC SODIUM (VOLTAREN) 1 % GEL TOPICAL GEL    Apply 2 g  topically 4 (four) times daily    FERROUS SULFATE (IRON PO)    Take by mouth    GABAPENTIN (NEURONTIN) 100 MG CAPSULE    Take 1 capsule (100 mg total) by mouth 2 (two) times daily as needed (Neuropathy)    LEVOTHYROXINE (SYNTHROID) 25 MCG TABLET    Take 1 tablet (25 mcg total) by mouth every morning    LISINOPRIL (ZESTRIL) 40 MG TABLET    Take 1 tablet (40 mg total) by mouth daily    MECLIZINE (ANTIVERT) 25 MG TABLET    Take 1 tablet (25 mg) by mouth 3 (three) times daily as needed for Dizziness    MULTIPLE VITAMINS-MINERALS (MULTIVITAMIN WITH MINERALS) TABLET    Take 1 tablet by mouth every morning    ROSUVASTATIN (CRESTOR) 40 MG TABLET    Take 1 tablet (40 mg) by mouth daily         REVIEW OF SYSTEMS   Review of Systems   Musculoskeletal:  Positive for back pain.   Neurological:  Positive for dizziness.       PHYSICAL EXAM     ED Triage Vitals   Enc Vitals Group      BP       Pulse       Resp       Temp       Temp src       SpO2       Weight       Height       Head Circumference       Peak Flow       Pain Score       Pain Loc       Pain Edu?       Excl. in GC?      Nursing note and vitals reviewed.    Constitutional: non-toxic  Head: Atraumatic.  Eyes: PERRL. EOMI. No scleral icterus.  ENT: Mucous membranes are moist and intact. Oropharynx is clear. Patent airway.  Neck: Supple. No cervical lymphadenopathy.  Cardiovascular: Regular rate. Regular rhythm. No murmurs, rubs, or gallops.  Pulmonary/Chest: No evidence of respiratory distress. Clear to auscultation bilaterally. No wheezing, rales or rhonchi.   GI: Soft, non-distended abdomen. No tenderness to  palpation of abdomen.  Extremities: No edema. No deformity.  Skin: No rash.   Neurological: Awake, alert and oriented x 3. CN II-XII intact. Strength intact. Sensation intact but patient states he subjectively feels less or different on the left side of her face, left upper and lower extremity. Wide but steady gait. Normal coordination.  Normal finger-to-nose and  heel-to-shin  Psychiatric: Appropriate affect. Appropriate mood. Appropriate behavior.    MEDICAL DECISION MAKING     PRIMARY PROBLEM LIST      Acute illness/injury with risk to life or bodily function (based on differential diagnosis or evaluation) DIAGNOSIS: Dizziness  Chronic Illness Impacting Care of the above problem: Hypertension and Advanced age Increases complexity of evaluation, Increases the risk of severe disease, and Increase the risk of disease progression    DISCUSSION      Dizziness. Last normal yesterday. Multiple hospitalizations, work-up for this per review of neuro consult note with dr. Francesco Sor 01/14/22. I called a code stroke after seeing patient in triage. She is getting CT head, CTA head/neck, CT perfusion. She displays a wide but steady gait with no observable deficit on quick neuro exam. Will perform more comprehensive exam when she gets back from CT. Will consult neuro.    I consulted Dr. Francesco Sor, neurology.  We discussed case.  He agreed to see patient in consultation.  Thrombolytic therapy not indicated as out of window.  Do not suspect LVO.  LVO was not apparent on advanced imaging.  Patient admitted to medicine.  Discussed case with Dr. Angelene Giovanni.  I ordered MRI brain as discussed with neurology.    Patient had a secondary complaint of left lower back pain rating down the left leg.  Suspect this is sciatica.  Do not suspect any acute spine emergency such as cauda equina.    If patient is being hospitalized is severe sepsis or septic shock suspected?: N/A          External Records Reviewed?: Inpatient Records  See above, neuro consult note 01/14/22, discussed above  Additional Notes                 NIH Stroke Score      Flowsheet Row Most Recent Value   Patient's calculated Stroke Score: 4 filed at 05/12/2022 1610            Vital Signs: Reviewed the patient's vital signs.   Nursing Notes: Reviewed and utilized available nursing notes.  Medical Records Reviewed: Reviewed available past  medical records.  Counseling: The emergency provider has spoken with the patient and discussed today's findings, in addition to providing specific details for the plan of care.  Questions are answered and there is agreement with the plan.      MIPS DOCUMENTATION              CARDIAC STUDIES    The following cardiac studies were independently interpreted by me the Emergency Medicine Provider.  For full cardiac study results please see chart.      I ordered an ekg for this patient but cannot confirm that it was done. There is no record digitally or on paper that I can find.                                  EMERGENCY IMAGING STUDIES    The following imagine studies were independently interpreted by me (emergency medicine provider):    I read CT  head and appreciate no intracranial hemorrhage.                  RADIOLOGY IMAGING STUDIES      CTA  Head & Neck   Final Result       1.No occlusion or hemodynamically significant stenosis of the major   intracranial arteries.   2.No hemodynamically significant carotid stenosis.   3.New mild narrowing of distal V2 segment of the left vertebral artery at   left C2 neural foramen. Unchanged mild focal narrowing of distal V2   segments of right and left vertebral artery at C4 and C1 level respectively   due to degenerative disc disease.   4.CT perfusion without a or evidence of core infarct.   5.Right thyroid lobe and isthmic nodules as detailed above are unchanged.   Correlation with dedicated thyroid ultrasound if not previously performed   is recommended.   6.Please see body of report for detailed discussion.         Comment:   Assessment for acute infarct, threshold for ischemia tissue volume is Tmax   > 6 s.    Threshold for infarct core volume estimated CBF <30 percent.   Threshold for poor collateral flow or severe delayed perfusion is Tmax>10   sec.      Nelya Ebadirad   05/12/2022 8:27 AM      CT Perfusion Brain   Final Result       1.No occlusion or hemodynamically  significant stenosis of the major   intracranial arteries.   2.No hemodynamically significant carotid stenosis.   3.New mild narrowing of distal V2 segment of the left vertebral artery at   left C2 neural foramen. Unchanged mild focal narrowing of distal V2   segments of right and left vertebral artery at C4 and C1 level respectively   due to degenerative disc disease.   4.CT perfusion without a or evidence of core infarct.   5.Right thyroid lobe and isthmic nodules as detailed above are unchanged.   Correlation with dedicated thyroid ultrasound if not previously performed   is recommended.   6.Please see body of report for detailed discussion.         Comment:   Assessment for acute infarct, threshold for ischemia tissue volume is Tmax   > 6 s.    Threshold for infarct core volume estimated CBF <30 percent.   Threshold for poor collateral flow or severe delayed perfusion is Tmax>10   sec.      Nelya Ebadirad   05/12/2022 8:27 AM      CT Head WO Contrast   Final Result      No acute intracranial abnormality.      Maylon Cos   05/12/2022 7:15 AM      MRI Brain WO Contrast    (Results Pending)       EMERGENCY DEPT. MEDICATIONS      ED Medication Orders (From admission, onward)      Start Ordered     Status Ordering Provider    05/13/22 0800 05/12/22 1018  levothyroxine (SYNTHROID) tablet 25 mcg  Every morning        Route: Oral  Ordered Dose: 25 mcg       Acknowledged JAVED, NAUSHEEN    05/12/22 2200 05/12/22 1018  rosuvastatin (CRESTOR) tablet 40 mg  At bedtime        Route: Oral  Ordered Dose: 40 mg       Acknowledged JAVED, NAUSHEEN  05/12/22 1230 05/12/22 1018  anastrozole (ARIMIDEX) tablet 1 mg  Every morning        Route: Oral  Ordered Dose: 1 mg       Acknowledged JAVED, NAUSHEEN    05/12/22 1200 05/12/22 1018  aspirin chewable tablet 81 mg  Daily        Route: Oral  Ordered Dose: 81 mg       Acknowledged JAVED, NAUSHEEN    05/12/22 1025 05/12/22 1024  heparin (porcine) injection 5,000 Units  Every 12 hours  scheduled        Route: Subcutaneous  Ordered Dose: 5,000 Units       Acknowledged JAVED, NAUSHEEN    05/12/22 1023 05/12/22 1024  ondansetron (ZOFRAN-ODT) disintegrating tablet 4 mg  Every 6 hours PRN        Route: Oral  Ordered Dose: 4 mg      See Hyperspace for full Linked Orders Report.    Acknowledged JAVED, NAUSHEEN    05/12/22 1023 05/12/22 1024  ondansetron (ZOFRAN) injection 4 mg  Every 6 hours PRN        Route: Intravenous  Ordered Dose: 4 mg      See Hyperspace for full Linked Orders Report.    Acknowledged JAVED, NAUSHEEN    05/12/22 1023 05/12/22 1024  acetaminophen (TYLENOL) tablet 650 mg  Every 6 hours PRN        Route: Oral  Ordered Dose: 650 mg      See Hyperspace for full Linked Orders Report.    Acknowledged JAVED, NAUSHEEN    05/12/22 1023 05/12/22 1024  acetaminophen (TYLENOL) suppository 650 mg  Every 6 hours PRN        Route: Rectal  Ordered Dose: 650 mg      See Hyperspace for full Linked Orders Report.    Acknowledged JAVED, NAUSHEEN    05/12/22 1021 05/12/22 1024  magnesium sulfate 1g in dextrose 5% IVPB (premix)  As needed        Route: Intravenous  Ordered Dose: 1 g       Acknowledged JAVED, NAUSHEEN    05/12/22 1021 05/12/22 1024  potassium chloride (KLOR-CON M20) CR tablet 0-40 mEq  As needed        Route: Oral  Ordered Dose: 0-40 mEq      See Hyperspace for full Linked Orders Report.    Acknowledged JAVED, NAUSHEEN    05/12/22 1021 05/12/22 1024  potassium chloride 10 mEq in 100 mL IVPB (premix)  As needed        Route: Intravenous  Ordered Dose: 10 mEq      See Hyperspace for full Linked Orders Report.    Acknowledged JAVED, NAUSHEEN    05/12/22 1021 05/12/22 1024  potassium & sodium phosphates (PHOS-NAK) 280-160-250 MG packet 2 packet  As needed        Route: Oral  Ordered Dose: 2 packet       Acknowledged JAVED, NAUSHEEN    05/12/22 1021 05/12/22 1024  naloxone (NARCAN) injection 0.2 mg  As needed        Route: Intravenous  Ordered Dose: 0.2 mg       Acknowledged JAVED,  NAUSHEEN    05/12/22 1021 05/12/22 1024  dextrose (GLUCOSE) 40 % oral gel 15 g of glucose  As needed        Route: Oral  Ordered Dose: 15 g of glucose      See Hyperspace for full Linked Orders Report.    Acknowledged  JAVED, NAUSHEEN    05/12/22 1021 05/12/22 1024  dextrose (D10W) 10% bolus 125 mL  As needed        Route: Intravenous  Ordered Dose: 12.5 g      See Hyperspace for full Linked Orders Report.    Acknowledged JAVED, NAUSHEEN    05/12/22 1021 05/12/22 1024  dextrose 50 % bolus 12.5 g  As needed        Route: Intravenous  Ordered Dose: 12.5 g      See Hyperspace for full Linked Orders Report.    Acknowledged JAVED, NAUSHEEN    05/12/22 1021 05/12/22 1024  glucagon (rDNA) (GLUCAGEN) injection 1 mg  As needed        Route: Intramuscular  Ordered Dose: 1 mg      See Hyperspace for full Linked Orders Report.    Acknowledged JAVED, NAUSHEEN    05/12/22 1021 05/12/22 1024  melatonin tablet 3 mg  At bedtime PRN        Route: Oral  Ordered Dose: 3 mg       Acknowledged JAVED, NAUSHEEN    05/12/22 1019 05/12/22 1018  sodium chloride 0.9 % bolus 500 mL  Once        Route: Intravenous  Ordered Dose: 500 mL       Last MAR action: Stopped JAVED, NAUSHEEN    05/12/22 1018 05/12/22 1018  meclizine (ANTIVERT) tablet 25 mg  3 times daily PRN        Route: Oral  Ordered Dose: 25 mg       Acknowledged JAVED, NAUSHEEN    05/12/22 0959 05/12/22 0958  0.9% NaCl infusion  Continuous        Route: Intravenous       Last MAR action: New Bag JAVED, NAUSHEEN    05/12/22 0710 05/12/22 0710  iohexol (OMNIPAQUE) 350 MG/ML injection 120 mL  IMG once as needed        Route: Intravenous  Ordered Dose: 120 mL       Last MAR action: Imaging Agent Given Joshwa Hemric W            LABORATORY RESULTS    Ordered and independently interpreted AVAILABLE laboratory tests.   Results       Procedure Component Value Units Date/Time    High Sensitivity Troponin-I at 2 hrs with calculated Delta [[id number]] Collected: 05/12/22 1017    Specimen: Blood  Updated: 05/12/22 1110     hs Troponin-I <2.7 ng/L      hs Troponin-I Delta Unable toCalc. ng/L     Lipid panel [[id number]] Collected: 05/12/22 0738    Specimen: Blood Updated: 05/12/22 1001    Narrative:      Fasting specimen    Urinalysis Reflex to Microscopic Exam [[id number]] Collected: 05/12/22 0916    Specimen: Urine Updated: 05/12/22 0932     Urine Type Clean Catch     Color, UA Straw     Clarity, UA Clear     Specific Gravity UA 1.025     Urine pH 7.0     Leukocyte Esterase, UA Negative     Nitrite, UA Negative     Protein, UR Negative     Glucose, UA Negative     Ketones UA Negative     Urobilinogen, UA Negative mg/dL      Bilirubin, UA Negative     Blood, UA Negative     RBC, UA 0 - 2 /hpf      WBC, UA 0 -  5 /hpf     High Sensitivity Troponin-I at 0 hrs [[id number]] Collected: 05/12/22 0738    Specimen: Blood Updated: 05/12/22 0811     hs Troponin-I <2.7 ng/L     Narrative:      Rescheduled by [id number] at 05/12/2022 07:34 Reason: Unable to Scan   Armband/label - use downtime procedure to collect.    Comprehensive metabolic panel [[id number]]  (Abnormal) Collected: 05/12/22 0738    Specimen: Blood Updated: 05/12/22 0805     Glucose 97 mg/dL      BUN 08.6 mg/dL      Creatinine 1.2 mg/dL      Sodium 578 mEq/L      Potassium 4.3 mEq/L      Chloride 109 mEq/L      CO2 26 mEq/L      Calcium 9.4 mg/dL      Protein, Total 7.2 g/dL      Albumin 4.1 g/dL      AST (SGOT) 25 U/L      ALT 11 U/L      Alkaline Phosphatase 81 U/L      Bilirubin, Total 0.2 mg/dL      Globulin 3.1 g/dL      Albumin/Globulin Ratio 1.3     Anion Gap 10.0     eGFR 46.4 mL/min/1.73 m2     Narrative:      Rescheduled by [id number] at 05/12/2022 07:34 Reason: Unable to Scan   Armband/label - use downtime procedure to collect.    Prothrombin time/INR [[id number]] Collected: 05/12/22 0738    Specimen: Blood Updated: 05/12/22 0753     PT 10.5 sec      PT INR 0.9    Narrative:      Rescheduled by [id number] at 05/12/2022 07:34 Reason: Unable to Scan   Armband/label  - use downtime procedure to collect.    APTT [[id number]] Collected: 05/12/22 0738     Updated: 05/12/22 0753     PTT 35 sec     Narrative:      Rescheduled by [id number] at 05/12/2022 07:34 Reason: Unable to Scan   Armband/label - use downtime procedure to collect.    CBC and differential [[id number]]  (Abnormal) Collected: 05/12/22 0738    Specimen: Blood Updated: 05/12/22 0745     WBC 6.10 x10 3/uL      Hgb 10.6 g/dL      Hematocrit 38.7 %      Platelets 359 x10 3/uL      RBC 3.97 x10 6/uL      MCV 85.9 fL      MCH 26.7 pg      MCHC 31.1 g/dL      RDW 16 %      MPV 11.0 fL      Instrument Absolute Neutrophil Count 3.69 x10 3/uL      Neutrophils 60.4 %      Lymphocytes Automated 29.2 %      Monocytes 8.2 %      Eosinophils Automated 1.5 %      Basophils Automated 0.5 %      Immature Granulocytes 0.2 %      Nucleated RBC 0.0 /100 WBC      Neutrophils Absolute 3.69 x10 3/uL      Lymphocytes Absolute Automated 1.78 x10 3/uL      Monocytes Absolute Automated 0.50 x10 3/uL      Eosinophils Absolute Automated 0.09 x10 3/uL      Basophils Absolute Automated 0.03 x10 3/uL  Immature Granulocytes Absolute 0.01 x10 3/uL      Absolute NRBC 0.00 x10 3/uL     Narrative:      Rescheduled by [id number] at 05/12/2022 07:34 Reason: Unable to Scan   Armband/label - use downtime procedure to collect.    Glucose Whole Blood - POCT [[id number]] Collected: 05/12/22 0656     Updated: 05/12/22 0659     Whole Blood Glucose POCT 94 mg/dL               CRITICAL CARE/PROCEDURES    Procedures    DIAGNOSIS      Diagnosis:  Final diagnoses:   Dizziness   Sciatica, left side       Disposition:  ED Disposition       ED Disposition   Expedited Observation    Condition   --    Date/Time   Thu May 12, 2022 0848    Comment   Admitting Physician: Arlyn Dunning [[id number]]   Estimated Length of Stay: < 2 midnights   Tentative Discharge Plan?: Home or Self Care [1]                 Prescriptions:  Patient's Medications   New Prescriptions    No medications on file    Previous Medications    AMLODIPINE (NORVASC) 10 MG TABLET    Take 1 tablet (10 mg total) by mouth daily    ANASTROZOLE (ARIMIDEX) 1 MG TABLET    Take 1 tablet (1 mg total) by mouth every morning    ASPIRIN 81 MG CHEWABLE TABLET    Chew 1 tablet (81 mg total) by mouth daily    DICLOFENAC SODIUM (VOLTAREN) 1 % GEL TOPICAL GEL    Apply 2 g topically 4 (four) times daily    FERROUS SULFATE (IRON PO)    Take by mouth    GABAPENTIN (NEURONTIN) 100 MG CAPSULE    Take 1 capsule (100 mg total) by mouth 2 (two) times daily as needed (Neuropathy)    LEVOTHYROXINE (SYNTHROID) 25 MCG TABLET    Take 1 tablet (25 mcg total) by mouth every morning    LISINOPRIL (ZESTRIL) 40 MG TABLET    Take 1 tablet (40 mg total) by mouth daily    MECLIZINE (ANTIVERT) 25 MG TABLET    Take 1 tablet (25 mg) by mouth 3 (three) times daily as needed for Dizziness    MULTIPLE VITAMINS-MINERALS (MULTIVITAMIN WITH MINERALS) TABLET    Take 1 tablet by mouth every morning    ROSUVASTATIN (CRESTOR) 40 MG TABLET    Take 1 tablet (40 mg) by mouth daily   Modified Medications    No medications on file   Discontinued Medications    ACETAMINOPHEN (TYLENOL) 325 MG TABLET    Take 2 tablets (650 mg) by mouth every 6 (six) hours as needed for Pain    APOAEQUORIN (PREVAGEN PO)    Take by mouth daily    DESONIDE (DESOWEN) 0.05 % OINTMENT    Apply topically 2 (two) times daily    EZETIMIBE (ZETIA) 10 MG TABLET    Take 1 tablet (10 mg total) by mouth daily    GUAIFENESIN (MUCINEX) 600 MG 12 HR TABLET    Take 2 tablets (1,200 mg total) by mouth 2 (two) times daily    MAGNESIUM OXIDE (MAG-OX) 400 MG TABLET    Take 1 tablet (400 mg total) by mouth daily    MIRTAZAPINE (REMERON) 15 MG TABLET    Take 1 tablet (  15 mg total) by mouth nightly    ONDANSETRON (ZOFRAN-ODT) 4 MG DISINTEGRATING TABLET    Take 1 tablet (4 mg) by mouth every 6 (six) hours as needed for Nausea    PANTOPRAZOLE (PROTONIX) 40 MG TABLET    Take 1 tablet (40 mg total) by mouth every morning before  breakfast    TRAMADOL (ULTRAM) 50 MG TABLET    Take 1 tablet (50 mg total) by mouth every 6 (six) hours as needed for Pain    VITAMIN B-1 (THIAMINE) 100 MG TABLET    Take 1 tablet (100 mg) by mouth every morning           This note was generated by the [employer] EMR system/ Dragon speech recognition and may contain inherent errors or omissions not intended by the user. Grammatical errors, random word insertions, deletions and pronoun errors  are occasional consequences of this technology due to software limitations. Not all errors are caught or corrected. If there are questions or concerns about the content of this note or information contained within the body of this dictation they should be addressed directly with the author for clarification.           Georgana Curio, MD  05/12/22 1212       Georgana Curio, MD  05/12/22 1215

## 2022-05-12 NOTE — Discharge Summary (Signed)
DISCHARGE SUMMARY/PROGRESS NOTE  [facility], DIVISION OF HOSPITALIST MEDICINE    [place] [place] [facility]      Date Time: 05/12/22 6:11 PM  Patient Name: Emily [name]  Attending Physician: Sim Boast, MD  Primary Care Physician: Rondel Baton, MD    Date of Admission: 05/12/2022  Date of Discharge: 05/12/2022    Outcome of Hospitalization:   Dizziness/vertigo, ruled out for acute CVA  History of breast cancer  Hypertension  Hyperlipidemia, with high LDL  Hypothyroidism    Significant Events:   Emily [name] is a 76 y.o. female with a PMHx of hypertension, hyperlipidemia, history of breast cancer, hypothyroidism, GERD presented to the emergency room with complaints of dizziness and chronic vertigo along with left-sided weakness/numbness.  Please refer to admission H&P for full details.  Patient had a CT brain and CT angio done in the emergency room followed by an MRI of the brain.  MRI of the brain is unremarkable.  Patient's orthostatics were checked and she was initially noted to be orthostatic, she responded well to the fluids and repeat orthostatics are normal.  UA is unremarkable, no signs of infection.  Patient seen by Dr. Francesco Sor from the neurology team and patient is cleared for discharge.  Physical therapy recommends home with home health services.  Patient has clinically improved.  Her LDL is noted to be high and patient reports she does not take her Crestor regularly, compliance to medications and outpatient follow-up is recommended.  She has an outpatient echo and stress test scheduled with her cardiologist recommend follow-up for same.  CT angio results recommend thyroid ultrasound to be done as an outpatient for thyroid nodules. POC d/w patient, neurology, nurse and Dr. Raford Pitcher.   Time spent: 45 min.     Today:   BP 152/64   Pulse (!) 49   Temp 98.2 F (36.8 C) (Oral)   Resp (!) 34   Ht 1.6 m (5' 2.99")   Wt 74.8 kg (164 lb 14.5 oz)   SpO2 97%   BMI 29.22  kg/m   Ranges for the last 24 hours:  Temp:  [97.6 F (36.4 C)-98.4 F (36.9 C)] 98.2 F (36.8 C)  Heart Rate:  [49-89] 49  Resp Rate:  [14-43] 34  BP: (129-183)/(60-75) 152/64       Procedures performed:   Radiology: all results from this admission  MRI Brain WO Contrast    Result Date: 05/12/2022   No acute intracranial abnormality. Chronic findings as detailed above. Nelya Ebadirad 05/12/2022 2:33 PM    CTA  Head & Neck    Result Date: 05/12/2022   1.No occlusion or hemodynamically significant stenosis of the major intracranial arteries. 2.No hemodynamically significant carotid stenosis. 3.New mild narrowing of distal V2 segment of the left vertebral artery at left C2 neural foramen. Unchanged mild focal narrowing of distal V2 segments of right and left vertebral artery at C4 and C1 level respectively due to degenerative disc disease. 4.CT perfusion without a or evidence of core infarct. 5.Right thyroid lobe and isthmic nodules as detailed above are unchanged. Correlation with dedicated thyroid ultrasound if not previously performed is recommended. 6.Please see body of report for detailed discussion. Comment: Assessment for acute infarct, threshold for ischemia tissue volume is Tmax > 6 s. Threshold for infarct core volume estimated CBF <30 percent. Threshold for poor collateral flow or severe delayed perfusion is Tmax>10 sec. Nelya Ebadirad 05/12/2022 8:27 AM    CT Perfusion Brain  Result Date: 05/12/2022   1.No occlusion or hemodynamically significant stenosis of the major intracranial arteries. 2.No hemodynamically significant carotid stenosis. 3.New mild narrowing of distal V2 segment of the left vertebral artery at left C2 neural foramen. Unchanged mild focal narrowing of distal V2 segments of right and left vertebral artery at C4 and C1 level respectively due to degenerative disc disease. 4.CT perfusion without a or evidence of core infarct. 5.Right thyroid lobe and isthmic nodules as detailed above are  unchanged. Correlation with dedicated thyroid ultrasound if not previously performed is recommended. 6.Please see body of report for detailed discussion. Comment: Assessment for acute infarct, threshold for ischemia tissue volume is Tmax > 6 s. Threshold for infarct core volume estimated CBF <30 percent. Threshold for poor collateral flow or severe delayed perfusion is Tmax>10 sec. Nelya Ebadirad 05/12/2022 8:27 AM    CT Head WO Contrast    Result Date: 05/12/2022  No acute intracranial abnormality. Casimiro Needle [facility] 05/12/2022 7:15 AM   Surgery: all results from this admission  * No surgery found *  Lab Results last 48 Hours       Procedure Component Value Units Date/Time    Lipid panel [[id number]]  (Abnormal) Collected: 05/12/22 0738    Specimen: Blood Updated: 05/12/22 1654     Cholesterol 265 mg/dL      Triglycerides 61 mg/dL      HDL 72 mg/dL      LDL Calculated 409 mg/dL      VLDL Calculated 12 mg/dL      Cholesterol / HDL Ratio 3.7 Index     Glucose Whole Blood - POCT [[id number]] Collected: 05/12/22 1332     Updated: 05/12/22 1335     Whole Blood Glucose POCT 97 mg/dL     High Sensitivity Troponin-I at 2 hrs with calculated Delta [[id number]] Collected: 05/12/22 1017    Specimen: Blood Updated: 05/12/22 1110     hs Troponin-I <2.7 ng/L      hs Troponin-I Delta Unable toCalc. ng/L     Urinalysis Reflex to Microscopic Exam [[id number]] Collected: 05/12/22 0916    Specimen: Urine Updated: 05/12/22 0932     Urine Type Clean Catch     Color, UA Straw     Clarity, UA Clear     Specific Gravity UA 1.025     Urine pH 7.0     Leukocyte Esterase, UA Negative     Nitrite, UA Negative     Protein, UR Negative     Glucose, UA Negative     Ketones UA Negative     Urobilinogen, UA Negative mg/dL      Bilirubin, UA Negative     Blood, UA Negative     RBC, UA 0 - 2 /hpf      WBC, UA 0 - 5 /hpf     High Sensitivity Troponin-I at 0 hrs [[id number]] Collected: 05/12/22 0738    Specimen: Blood Updated: 05/12/22 0811     hs Troponin-I <2.7  ng/L     Narrative:      Rescheduled by [id number] at 05/12/2022 07:34 Reason: Unable to Scan   Armband/label - use downtime procedure to collect.    Comprehensive metabolic panel [[id number]]  (Abnormal) Collected: 05/12/22 0738    Specimen: Blood Updated: 05/12/22 0805     Glucose 97 mg/dL      BUN 47.4 mg/dL      Creatinine 1.2 mg/dL      Sodium 259 mEq/L      Potassium 4.3 mEq/L  Chloride 109 mEq/L      CO2 26 mEq/L      Calcium 9.4 mg/dL      Protein, Total 7.2 g/dL      Albumin 4.1 g/dL      AST (SGOT) 25 U/L      ALT 11 U/L      Alkaline Phosphatase 81 U/L      Bilirubin, Total 0.2 mg/dL      Globulin 3.1 g/dL      Albumin/Globulin Ratio 1.3     Anion Gap 10.0     eGFR 46.4 mL/min/1.73 m2     Narrative:      Rescheduled by [id number] at 05/12/2022 07:34 Reason: Unable to Scan   Armband/label - use downtime procedure to collect.    Prothrombin time/INR [[id number]] Collected: 05/12/22 0738    Specimen: Blood Updated: 05/12/22 0753     PT 10.5 sec      PT INR 0.9    Narrative:      Rescheduled by [id number] at 05/12/2022 07:34 Reason: Unable to Scan   Armband/label - use downtime procedure to collect.    APTT [[id number]] Collected: 05/12/22 0738     Updated: 05/12/22 0753     PTT 35 sec     Narrative:      Rescheduled by [id number] at 05/12/2022 07:34 Reason: Unable to Scan   Armband/label - use downtime procedure to collect.    CBC and differential [[id number]]  (Abnormal) Collected: 05/12/22 0738    Specimen: Blood Updated: 05/12/22 0745     WBC 6.10 x10 3/uL      Hgb 10.6 g/dL      Hematocrit 41.3 %      Platelets 359 x10 3/uL      RBC 3.97 x10 6/uL      MCV 85.9 fL      MCH 26.7 pg      MCHC 31.1 g/dL      RDW 16 %      MPV 11.0 fL      Instrument Absolute Neutrophil Count 3.69 x10 3/uL      Neutrophils 60.4 %      Lymphocytes Automated 29.2 %      Monocytes 8.2 %      Eosinophils Automated 1.5 %      Basophils Automated 0.5 %      Immature Granulocytes 0.2 %      Nucleated RBC 0.0 /100 WBC      Neutrophils Absolute 3.69 x10  3/uL      Lymphocytes Absolute Automated 1.78 x10 3/uL      Monocytes Absolute Automated 0.50 x10 3/uL      Eosinophils Absolute Automated 0.09 x10 3/uL      Basophils Absolute Automated 0.03 x10 3/uL      Immature Granulocytes Absolute 0.01 x10 3/uL      Absolute NRBC 0.00 x10 3/uL     Narrative:      Rescheduled by [id number] at 05/12/2022 07:34 Reason: Unable to Scan   Armband/label - use downtime procedure to collect.    Glucose Whole Blood - POCT [[id number]] Collected: 05/12/22 0656     Updated: 05/12/22 0659     Whole Blood Glucose POCT 94 mg/dL             Treatment Team:   Attending Provider: Sim Boast, MD  Consulting Physician: Cathe Mons, MD    Disposition:   Disposition: home with family    Condition at Discharge:   stable  Follow up Recommendations for Receiving Provider   Diet- Diet heart healthy    Continue taking your preadmission medications at home.  Your cholesterol is very high, recommend continue to take Crestor 40 mg at bedtime and follow-up with your primary care doctor as an outpatient for close monitoring.  Recommend a low-fat diet.  An outpatient thyroid ultrasound is recommended to further evaluate the nodules evident on the CT scan.  Recommend follow-up with your cardiologist and get the 2D echocardiogram and stress test as an outpatient as already scheduled.  Unresulted Labs       None            Discharge Instructions:      Follow-up Information       Rondel Baton, MD. Schedule an appointment as soon as possible for a visit in 1 week(s).    Specialty: Internal Medicine  Contact information:  [address]  [place] [address]  [phone]                              Medication List        CONTINUE taking these medications      amLODIPine 10 MG tablet  Commonly known as: NORVASC  Take 1 tablet (10 mg total) by mouth daily     anastrozole 1 MG tablet  Commonly known as: ARIMIDEX  Take 1 tablet (1 mg total) by mouth every morning     aspirin 81 MG chewable  tablet  Chew 1 tablet (81 mg total) by mouth daily     diclofenac Sodium 1 % Gel topical gel  Commonly known as: VOLTAREN  Apply 2 g topically 4 (four) times daily     gabapentin 100 MG capsule  Commonly known as: NEURONTIN  Take 1 capsule (100 mg total) by mouth 2 (two) times daily as needed (Neuropathy)     IRON PO     levothyroxine 25 MCG tablet  Commonly known as: SYNTHROID  Take 1 tablet (25 mcg total) by mouth every morning     lisinopril 40 MG tablet  Commonly known as: ZESTRIL  Take 1 tablet (40 mg total) by mouth daily     meclizine 25 MG tablet  Commonly known as: ANTIVERT  Take 1 tablet (25 mg) by mouth 3 (three) times daily as needed for Dizziness     multivitamin with minerals tablet     rosuvastatin 40 MG tablet  Commonly known as: CRESTOR  Take 1 tablet (40 mg) by mouth daily            Signed by: Uvaldo Rising, PA    I have personally obtained history and examined the patient. I have reviewed the clinical data including old records, x-rays, CT Scans, and blood test results. I have discussed the plan of care with the PA/NP, and I agree with their evaluation and management plan as stated in the note.   Sim Boast, MD, FACP  05/12/2022, 6:51 PM           Disclaimer:     This electronic message and its contents and attachments contain information from [facility] and is confidential or otherwise protected from disclosure. The information is intended to be for the addressee only.   If you are not the addressee, any disclosure, copy, distribution or use of the contents of this message is prohibited. If you have received this electronic message in error, please  notify us immediately and destroy the original message and all copies.

## 2022-05-12 NOTE — Consults (Signed)
NEUROLOGY CONSULTATION    Date Time: 05/12/22 9:30 AM  Patient Name: Emily [name]  Attending Physician: Sim Boast, MD      Assessment & Plan:   Admitted for Dizziness, and difficulty walking. Multiple hospitalizations for similar symptoms. DDX: CVA / BPPV /Lumbar radiculopathy/ psychogenic.     Ct head, CTA & Perfusion in ED- no acute infarct. Mild narrowing of distal left vertebral artery. Unlikely cause of current complaint.   MRI Pending?  Continue asa and statin  PT/OT evaluate   A1c, Lipid   Will continue to follow   D/w Dr. Francesco Sor        Addendum:------>>patient seen and discussed with NP, patient personally examined on exam she is awake alert oriented x 3 speech is management of symmetric eye movements intact moving both upper and lower Nidryl temperature intact bilaterally if MRI is negative, can be discharged, agree with addressing orthostasis, patient was somewhat orthostatic.      History of Present Illness:   Neurology consultation requested by:--> Dr. Thad Ranger Emergency Physician    76 y.o female presented to the ed for room spinning dizziness that is better if she sits still and difficulty walking because she had left leg pain and numbness. States the pain started in the hip and radiates down the leg. Symptoms started yesterday evening. Pt has had hospital admission with similar complaints earlier this year.       Past Medical History:     Past Medical History:   Diagnosis Date    Breast cancer 2014    right breast mastectomy    Exercise-induced angina     negative work up    GERD (gastroesophageal reflux disease)     Headache     resolved    Hyperlipidemia     Hypertension     Hypothyroidism     Low back pain     Malignant neoplasm of overlapping sites of right female breast 10/19/2015    Syncope and collapse     Chronic Vertigo    Vertigo        Meds:    home meds     Home Medications       Med List Status: Complete Set By: Uvaldo Rising, PA at 05/12/2022  9:05 AM               amLODIPine (NORVASC) 10 MG tablet     Take 1 tablet (10 mg total) by mouth daily     anastrozole (ARIMIDEX) 1 MG tablet     Take 1 tablet (1 mg total) by mouth every morning     Apoaequorin (PREVAGEN PO)     Take by mouth daily     aspirin 81 MG chewable tablet     Chew 1 tablet (81 mg total) by mouth daily     desonide (DESOWEN) 0.05 % ointment     Apply topically 2 (two) times daily     diclofenac Sodium (VOLTAREN) 1 % Gel topical gel     Apply 2 g topically 4 (four) times daily     Ferrous Sulfate (IRON PO)     Take by mouth     gabapentin (NEURONTIN) 100 MG capsule     Take 1 capsule (100 mg total) by mouth 2 (two) times daily as needed (Neuropathy)     levothyroxine (SYNTHROID) 25 MCG tablet     Take 1 tablet (25 mcg total) by mouth every morning     lisinopril (ZESTRIL) 40 MG tablet  Take 1 tablet (40 mg total) by mouth daily     meclizine (ANTIVERT) 25 MG tablet     Take 1 tablet (25 mg) by mouth 3 (three) times daily as needed for Dizziness     Multiple Vitamins-Minerals (MULTIVITAMIN WITH MINERALS) tablet     Take 1 tablet by mouth every morning     rosuvastatin (CRESTOR) 40 MG tablet     Take 1 tablet (40 mg) by mouth daily                                                    Allergies   Allergen Reactions    Morphine Itching    Morphine And Related Itching and Nausea And Vomiting     dizzy       Social & Family History:     Social History     Socioeconomic History    Marital status: Widowed   Tobacco Use    Smoking status: Never    Smokeless tobacco: Never   Vaping Use    Vaping Use: Never used   Substance and Sexual Activity    Alcohol use: Not Currently     Comment: occ    Drug use: No    Sexual activity: Not Currently       Family History   Problem Relation Age of Onset    Breast cancer Neg Hx            CODE STATUS:   I personally reviewed all of the medications.  Medication list generated using all available resources.  Elder abuse (physical)  - negative  Advanced care plan - reviewed from chart or  in discussion with pt or family    Review of Systems:   No headache, eye, ear nose, throat problems; no coughing or wheezing or shortness of breath, No chest pain or orthopnea, no abdominal pain, nausea or vomiting, No pain in the body or extremities, no psychiatric, neurological, endocrine, hematological or cardiac complaints except as noted above.         Physical Exam:   Blood pressure 151/68, pulse 73, temperature 98.1 F (36.7 C), temperature source Oral, resp. rate 18, height 1.6 m (5\' 3" ), weight 74.8 kg (164 lb 14.5 oz), SpO2 98 %.    HEENT: Normocephalic.no carotid bruits  Lungs:  CTA bil  Abd Soft   Cardiac:  S1,S2, normal rate and rhythm  Neck: supple, no cartoid bruits  Extremities: no edema  Skin: no rashes seen in exposed areas     Neuro:  Level of consciousness:  Alert and appropriate  Oriented:  X 3  Cognition:  Intact naming, recognition, concentration and following complex commands  Cranial Nerves:  II-XII intact  Strength:  No upper extremity drift, 5/5 strength x 4 extremities  Coordination:  Intact FTN testing  Reflexes:  +2 throughout, down going toes bil  Sensation: Intact x 4 extremities to LT, temp, vibration      Labs:     Recent Labs   Lab 05/12/22  0738   Glucose 97   BUN 11.0   Creatinine 1.2*   Calcium 9.4   Sodium 145   Potassium 4.3   Chloride 109   CO2 26   Albumin 4.1   AST (SGOT) 25   ALT 11   Bilirubin, Total 0.2   Alkaline Phosphatase  81     Recent Labs   Lab 05/12/22  0738   WBC 6.10   Hgb 10.6*   Hematocrit 34.1*   MCV 85.9   MCH 26.7   MCHC 31.1*   Platelets 359*         Recent Labs     05/12/22  0738   PTT 35   PT 10.5   PT INR 0.9          Radiology Results (24 Hour)       Procedure Component Value Units Date/Time    CTA  Head & Neck [[id number]] Collected: 05/12/22 0801    Order Status: Completed Updated: 05/12/22 0830    Narrative:      CLINICAL INFORMATION: Neuro deficit, acute, stroke suspected    TECHNIQUE: Brain CT perfusion. CT angiography of the head and  neck  including MIP reconstructions. Measurement of cervical carotid stenosis was  performed using NASCET criteria.    All CT scans are performed using one of these three dose reduction  techniques: automated exposure control, adjustment of the mA and/or kV  according to patient size, or use of iterative reconstruction techniques.    CONTRAST DOSE: 120 mL Omnipaque 350 IV    COMPARISON: CT perfusion and CT head and neck on 01/14/2022    FINDINGS:     CT PERFUSION:  CBF<30% (core infarct) volume: 0 mL  Tmax>6s (hypoperfused brain) volume: 0 mL. Location: Not applicable  Mismatch volume (Tmax>6s volume - CBF<30% volume): 0 mL  Mismatch ratio (Tmax>6s volume/CBF<30% volume): Not applicable   Hypoperfusion index (Tmax>10s/Tmax>6s): Not applicable  CBV index (rCBV in Tmax>6s): Not applicable    The perfusion without evidence of penumbra or core infarct.    CT ANGIOGRAM:  HEAD:   *No occlusion of the major intracranial arteries. No aneurysm.  *Mild atherosclerotic narrowing of the cavernous segments of the ICAs  bilaterally      NECK:   *No hemodynamically significant carotid or vertebral stenosis.  *Mild focal narrowing of distal V2 segment of the left vertebral artery has  its enters the left C2 neural foramen, new, due to adjacent degenerative  changes. Unchanged mild focal narrowing of distal V2 segment of the left  vertebral artery as it crosses behind the left arch of C1.  *Mild focal narrowing of distal V2 segment of the right vertebral artery  within the C4 right neural foramen, due to adjacent degenerative changes.  *Minimal calcifications of the common carotid arteries bilaterally without  significant narrowing. Mild calcifications in the carotid bifurcations  bilaterally without hemodynamically significant narrowing.    BRAIN: No mass effect or hydrocephalus.    ORBITS: Right lens replacement  PARANASAL SINUSES: Clear.  AERODIGESTIVE TRACT: No mass or mass effect involving the pharyngeal  mucosal space or  larynx.   SALIVARY GLANDS: The parotid and submandibular glands are normal.   THYROID: Right thyroid lobe nodule measuring 0.8 x 1.7 cm, previously 0.9 x  1.7 cm. Nodule in the isthmus measuring 0.7 x 1.7, previously 0.8 x 2.1 cm.  LYMPH NODES: No lymphadenopathy.  BONES: No suspicious osseous lesions.  LUNG APICES: Clear.  OTHER: Mixing artifact within the internal jugular veins.      Impression:         1.No occlusion or hemodynamically significant stenosis of the major  intracranial arteries.  2.No hemodynamically significant carotid stenosis.  3.New mild narrowing of distal V2 segment of the left vertebral artery at  left C2 neural foramen. Unchanged mild focal narrowing  of distal V2  segments of right and left vertebral artery at C4 and C1 level respectively  due to degenerative disc disease.  4.CT perfusion without a or evidence of core infarct.  5.Right thyroid lobe and isthmic nodules as detailed above are unchanged.  Correlation with dedicated thyroid ultrasound if not previously performed  is recommended.  6.Please see body of report for detailed discussion.      Comment:  Assessment for acute infarct, threshold for ischemia tissue volume is Tmax  > 6 s.   Threshold for infarct core volume estimated CBF <30 percent.  Threshold for poor collateral flow or severe delayed perfusion is Tmax>10  sec.    Nelya Ebadirad  05/12/2022 8:27 AM    CT Perfusion Brain [[id number]] Collected: 05/12/22 0801    Order Status: Completed Updated: 05/12/22 0830    Narrative:      CLINICAL INFORMATION: Neuro deficit, acute, stroke suspected    TECHNIQUE: Brain CT perfusion. CT angiography of the head and neck  including MIP reconstructions. Measurement of cervical carotid stenosis was  performed using NASCET criteria.    All CT scans are performed using one of these three dose reduction  techniques: automated exposure control, adjustment of the mA and/or kV  according to patient size, or use of iterative reconstruction  techniques.    CONTRAST DOSE: 120 mL Omnipaque 350 IV    COMPARISON: CT perfusion and CT head and neck on 01/14/2022    FINDINGS:     CT PERFUSION:  CBF<30% (core infarct) volume: 0 mL  Tmax>6s (hypoperfused brain) volume: 0 mL. Location: Not applicable  Mismatch volume (Tmax>6s volume - CBF<30% volume): 0 mL  Mismatch ratio (Tmax>6s volume/CBF<30% volume): Not applicable   Hypoperfusion index (Tmax>10s/Tmax>6s): Not applicable  CBV index (rCBV in Tmax>6s): Not applicable    The perfusion without evidence of penumbra or core infarct.    CT ANGIOGRAM:  HEAD:   *No occlusion of the major intracranial arteries. No aneurysm.  *Mild atherosclerotic narrowing of the cavernous segments of the ICAs  bilaterally      NECK:   *No hemodynamically significant carotid or vertebral stenosis.  *Mild focal narrowing of distal V2 segment of the left vertebral artery has  its enters the left C2 neural foramen, new, due to adjacent degenerative  changes. Unchanged mild focal narrowing of distal V2 segment of the left  vertebral artery as it crosses behind the left arch of C1.  *Mild focal narrowing of distal V2 segment of the right vertebral artery  within the C4 right neural foramen, due to adjacent degenerative changes.  *Minimal calcifications of the common carotid arteries bilaterally without  significant narrowing. Mild calcifications in the carotid bifurcations  bilaterally without hemodynamically significant narrowing.    BRAIN: No mass effect or hydrocephalus.    ORBITS: Right lens replacement  PARANASAL SINUSES: Clear.  AERODIGESTIVE TRACT: No mass or mass effect involving the pharyngeal  mucosal space or larynx.   SALIVARY GLANDS: The parotid and submandibular glands are normal.   THYROID: Right thyroid lobe nodule measuring 0.8 x 1.7 cm, previously 0.9 x  1.7 cm. Nodule in the isthmus measuring 0.7 x 1.7, previously 0.8 x 2.1 cm.  LYMPH NODES: No lymphadenopathy.  BONES: No suspicious osseous lesions.  LUNG APICES:  Clear.  OTHER: Mixing artifact within the internal jugular veins.      Impression:         1.No occlusion or hemodynamically significant stenosis of the major  intracranial arteries.  2.No hemodynamically  significant carotid stenosis.  3.New mild narrowing of distal V2 segment of the left vertebral artery at  left C2 neural foramen. Unchanged mild focal narrowing of distal V2  segments of right and left vertebral artery at C4 and C1 level respectively  due to degenerative disc disease.  4.CT perfusion without a or evidence of core infarct.  5.Right thyroid lobe and isthmic nodules as detailed above are unchanged.  Correlation with dedicated thyroid ultrasound if not previously performed  is recommended.  6.Please see body of report for detailed discussion.      Comment:  Assessment for acute infarct, threshold for ischemia tissue volume is Tmax  > 6 s.   Threshold for infarct core volume estimated CBF <30 percent.  Threshold for poor collateral flow or severe delayed perfusion is Tmax>10  sec.    Nelya Ebadirad  05/12/2022 8:27 AM    CT Head WO Contrast [[id number]] Collected: 05/12/22 0708    Order Status: Completed Updated: 05/12/22 0717    Narrative:      CT HEAD WO CONTRAST: 05/12/2022 7:01 AM    CLINICAL INFORMATION  Neuro deficit, acute, stroke suspected.    COMPARISON  CT, head dated 02/15/2022    TECHNIQUE  Axial CT images of the brain acquired. Sagittal and coronal reformations  performed.    FINDINGS   INTRA-AXIAL STRUCTURES  Cerebrum/cerebellum: No evidence of intracranial mass or acute hemorrhage.  Maintained gray-white differentiation. Heterogeneous hypoattenuation noted  within the periventricular and subcortical white matter, consistent with  chronic microvascular ischemic changes.  Ventricular system: Mild compensatory ventricular enlargement.  Midline shift: No evidence of midline shift.  Sella: The sella turcica and hypothalamic region are within normal limits.    EXTRA-AXIAL STRUCTURES  Extra-axial  spaces: Normal in size and configuration for patient age.  Orbits: Status post right-sided lens replacement.  Sinuses: Mild ethmoidal mucosal thickening is present.  Visualized  paranasal sinuses are otherwise clear.  Visualized upper cervical spinal cord: Normal.    VASCULATURE  Intracranial atherosclerosis is present.    OSSEOUS STRUCTURES  No suspicious lesion within the calvarium or skull base. No evidence of  acute fracture.      Impression:        No acute intracranial abnormality.    Maylon CosMichael Doxey  05/12/2022 7:15 AM             All recent brain and spine imaging (MRI, CT) personally reviewed.    Chart reviewed    45 minutes; >50% time spent in counseling or coordination of care        This note was generated by the Epic EMR system/Speech recognition and may contain inherent errors or omissions not intended by the user. Grammatical errors, random word insertions, deletions and pronoun errors  are occasional consequences of this technology due to software limitations.   Not all errors are caught or corrected. If there are questions or concerns about the content of this note or information contained within the body of this dictation they should be addressed directly with the author for clarification.    Signed by: Geralynn RileKara S Johnson, NP,  Spectralink: [phone]      Answering Service: [phone]

## 2022-05-12 NOTE — Progress Notes (Signed)
Pt is from home alone; DCP home w/ HH    Pt son assist pt with getting to appts and grocery store.    At [place] pt will need a taxi       05/12/22 1506   Patient Type   Within 30 Days of Previous Admission? No   Healthcare Decisions   Interviewed: Patient   Orientation/Decision Making Abilities of Patient Alert and Oriented x3, able to make decisions   Advance Directive Patient does not have advance directive   Prior to admission   Prior level of function Independent with ADLs;Ambulates independently   Type of Residence Private residence   Home Layout One level   Have running water, electricity, heat, etc? Yes   Living Arrangements Alone   How do you get to your MD appointments? son   How do you get your groceries? son   Who fixes your meals? self   Who does your laundry? self   Who picks up your prescriptions? son   Dressing Independent   Grooming Independent   Feeding Independent   Bathing Independent   Toileting Independent   Discharge Planning   Support Systems Family members;Children   Patient expects to be discharged to: private home   Anticipated [place] plan discussed with: Same as interviewed   Mode of transportation: Taxi/taxi voucher   Does the patient have perscription coverage? Yes   Financial Resource Strain   How hard is it for you to pay for the very basics like food, housing, medical care, and heating? Not hard   Housing Stability   In the last 12 months, was there a time when you were not able to pay the mortgage or rent on time? N   In the last 12 months, was there a time when you did not have a steady place to sleep or slept in a shelter (including now)? N   Transportation Needs   In the past 12 months, has lack of transportation kept you from medical appointments or from getting medications? no   In the past 12 months, has lack of transportation kept you from meetings, work, or from getting things needed for daily living? No   Consults/Providers   PT Evaluation Needed 1   OT Evalulation Needed 1   SLP  Evaluation Needed 2   Correct PCP listed in Epic? Yes   Family and PCP   PCP on file was verified as the current PCP? Yes   In case you are admitted, transferred or discharged, would like family notified? Yes   Name of family member to be notified Dorma Russell   In case you are admitted, transferred or discharged, would like your PCP notified? Yes   Important Message from [facility] Notice   Patient received 1st IMM Letter?  --

## 2022-05-12 NOTE — ED to IP RN Note (Signed)
[facility] DEPART[facility]  ED NURSING NOTE FOR THE RECEIVING INPATIENT NURSE   ED NURSE Jill Side [phone]   ED CHARGE RN Megan   ADMISSION INFORMATION   Margy Sumler Teal is a 76 y.o. female admitted with an ED diagnosis of:    No diagnosis found.     Isolation: None   Allergies: Morphine and Morphine and related   Holding Orders confirmed? No   Belongings Documented? Yes   Home medications sent to pharmacy confirmed? No   NURSING CARE   Patient Comes From:   Mental Status: Home Independent  alert and oriented   ADL: Independent with all ADLs   Ambulation: mild difficulty   Pertinent Information  and Safety Concerns:     [place] Violence Risk Level: Low Right limb alert, fall risk     CT / NIH   CT Head ordered on this patient?  Yes   NIH/Dysphagia assessment done prior to admission? Yes   VITAL SIGNS (at the time of this note)      Vitals:    05/12/22 0854   BP: 151/68   Pulse: 73   Resp: 18   Temp: 98.1 F (36.7 C)   SpO2: 98%

## 2022-05-12 NOTE — Discharge Instr - AVS First Page (Addendum)
-  Continue taking your preadmission medications at home.  -Your cholesterol is very high, recommend continue to take Crestor 40 mg at bedtime and follow-up with your primary care doctor as an outpatient for close monitoring.  Recommend a low-fat diet.  -An outpatient thyroid ultrasound is recommended to further evaluate the nodules evident on the CT scan.  -Recommend follow-up with your cardiologist and get the 2D echocardiogram and stress test as an outpatient as already scheduled.      Home Health Discharge Information    Your doctor has ordered Physical Therapy and Occupational Therapy in-home service(s) for you while you recuperate at home, to assist you in the transition from hospital to home.    The agency that you or your representative chose to provide the service:  Name of Home Health Agency Placement: Other (comment box) ([facility] Phone: [phone])]        The above services were set up by:  Hoover Brunette, RN [facility] Liaison) Phone  [phone]      IF YOU HAVE NOT HEARD FROM YOUR HOME YOUR HOME HEALTH AGENCY WITHIN 24-48 HOURS AFTER DISCHARGE PLEASE CALL YOUR AGENCY TO ARRANGE A TIME FOR YOUR FIRST VISIT. FOR ANY SCHEDULING CONCERNS OR QUESTIONS RELATED TO HOME HEALTH, SUCH AS TIME OR DATE PLEASE CONTACT YOUR HOME HEALTH AGENCY AT THE NUMBER LISTED ABOVE.

## 2022-05-12 NOTE — Progress Notes (Signed)
Patient leaving for MRI

## 2022-05-12 NOTE — H&P (Signed)
ADMISSION HISTORY AND PHYSICAL EXAM  [facility], DIVISION OF HOSPITALIST MEDICINE   [place] [place] [facility]   [place] Pager: [phone]      Date Time: 05/12/22 10:25 AM  Patient Name: Emily [name]  Attending Physician: Sim Boast, MD  Primary Care Physician: Rondel Baton, MD    CC: dizziness, left sided weakness  History Gathered From: Self    Assessment:     Active Hospital Problems    Diagnosis    Dizziness       Emily [name] is a 76 y.o. female with a PMHx of hypertension, hyperlipidemia, history of breast cancer, hypothyroidism, GERD presented to the emergency room with complaints of dizziness and chronic vertigo along with left-sided weakness.    Plan:   -Admit to [facility] Hospitalist service    #1 dizziness/vertigo, left-sided weakness, rule out acute CVA  CT brain and CT angio noted  MRI of the brain without contrast  Continue aspirin/statin   PT eval  Check orthostatics, IV fluid hydration  No signs of infection, UA is unremarkable  Neurology consulted  Check lipid panel   Meclizine as needed    #2 history of breast cancer  Resume anastrozole    #3 hypertension  Hold BP medications to allow for permissive hypertension    #4 hyperlipidemia  Resume Crestor    #5 hypothyroidism   resume levothyroxine, CT angio results noted recommended thyroid ultrasound to be done as an outpatient    POC d/w patient, neurology, nurse and Dr. Raford Pitcher.   Time spent: 55 min.      Patient has BMI=Body mass index is 29.21 kg/m.  Diagnosis: Overweight based on BMI criteria         Nutrition:Diet NPO effective now    Code status: Full code     Status/Disposition:   Pt is admitted under OBSERVATION with above concerns.    Anticipated medical stability for discharge: 24 Hrs       History of Presenting Illnes   Emily [name] is a 76 y.o. female with past medical history significant for hypertension, hyperlipidemia, GERD, hypothyroidism, history of breast cancer presented to the emergency room  with complaints of dizziness and chronic vertigo and left-sided weakness.  Patient is alert oriented x 3.  She reports of having dizziness, difficulty walking with the left-sided shaking/weakness that happened yesterday midday.  She denies of any chest pain or shortness of breath.  Denies of any nausea vomiting or diarrhea.  She presented for further evaluation.    Past Medical Histor     Past Medical History:   Diagnosis Date    Breast cancer 2014    right breast mastectomy    Exercise-induced angina     negative work up    GERD (gastroesophageal reflux disease)     Headache     resolved    Hyperlipidemia     Hypertension     Hypothyroidism     Low back pain     Malignant neoplasm of overlapping sites of right female breast 10/19/2015    Syncope and collapse     Chronic Vertigo    Vertigo        Available old records reviewed, including: EPIC     Past Surgical History:     Past Surgical History:   Procedure Laterality Date    COLONOSCOPY  2021    COLONOSCOPY, POLYPECTOMY N/A 07/21/2020    Procedure: COLONOSCOPY, POLYPECTOMY;  Surgeon: Einar Grad, MD;  Location: [facility];  Service: Gastroenterology;  Laterality: N/A;    EGD, BIOPSY N/A 07/21/2020    Procedure: EGD, BIOPSY;  Surgeon: Einar Grad, MD;  Location: [facility];  Service: Gastroenterology;  Laterality: N/A;    EXPLORATORY LAPAROTOMY N/A 05/22/2021    Procedure: EXPLORATORY LAPAROTOMY;  Surgeon: Delena Serve, MD;  Location: [facility];  Service: General;  Laterality: N/A;    HYSTERECTOMY      LAPAROSCOPY, DIAGNOSTIC N/A 05/22/2021    Procedure: LAPAROSCOPY, DIAGNOSTIC;  Surgeon: Delena Serve, MD;  Location: [facility];  Service: General;  Laterality: N/A;    LAPAROTOMY, COLECTOMY, RIGHT Right 05/22/2021    Procedure: LAPAROTOMY, COLECTOMY, RIGHT;  Surgeon: Delena Serve, MD;  Location: [facility];  Service: General;  Laterality: Right;    MASTECTOMY Right 2015       Family History:     Family History   Problem  Relation Age of Onset    Breast cancer Neg Hx        Social History:    reports that she has never smoked. She has never used smokeless tobacco. She reports that she does not currently use alcohol. She reports that she does not use drugs.    Allergies:     Allergies   Allergen Reactions    Morphine Itching    Morphine And Related Itching and Nausea And Vomiting     dizzy       Medications:     Home Medications       Med List Status: Complete Set By: Uvaldo Rising, PA at 05/12/2022  9:05 AM              amLODIPine (NORVASC) 10 MG tablet     Take 1 tablet (10 mg total) by mouth daily     anastrozole (ARIMIDEX) 1 MG tablet     Take 1 tablet (1 mg total) by mouth every morning     aspirin 81 MG chewable tablet     Chew 1 tablet (81 mg total) by mouth daily     diclofenac Sodium (VOLTAREN) 1 % Gel topical gel     Apply 2 g topically 4 (four) times daily     Ferrous Sulfate (IRON PO)     Take by mouth     gabapentin (NEURONTIN) 100 MG capsule     Take 1 capsule (100 mg total) by mouth 2 (two) times daily as needed (Neuropathy)     levothyroxine (SYNTHROID) 25 MCG tablet     Take 1 tablet (25 mcg total) by mouth every morning     lisinopril (ZESTRIL) 40 MG tablet     Take 1 tablet (40 mg total) by mouth daily     meclizine (ANTIVERT) 25 MG tablet     Take 1 tablet (25 mg) by mouth 3 (three) times daily as needed for Dizziness     Multiple Vitamins-Minerals (MULTIVITAMIN WITH MINERALS) tablet     Take 1 tablet by mouth every morning     rosuvastatin (CRESTOR) 40 MG tablet     Take 1 tablet (40 mg) by mouth daily                                                        Method by which medications were confirmed on  admission: patient       Review of Systems:   All other systems were reviewed and are negative except:as above in HPI     Physical Exam:   Patient Vitals for the past 24 hrs:   BP Temp Temp src Pulse Resp SpO2 Height Weight   05/12/22 1009 129/60 -- -- 74 20 97 % -- --   05/12/22 1003 135/64 -- -- 60 19 98 % -- --    05/12/22 1000 150/65 97.6 F (36.4 C) Oral (!) 56 16 98 % -- --   05/12/22 0854 151/68 98.1 F (36.7 C) Oral 73 18 98 % -- --   05/12/22 0755 145/65 -- -- 65 14 97 % -- --   05/12/22 0750 -- -- -- 64 16 97 % -- --   05/12/22 0748 147/67 -- -- -- -- -- -- --   05/12/22 0722 134/60 -- -- 89 20 100 % -- --   05/12/22 0656 134/60 98.4 F (36.9 C) Oral 63 20 100 % 1.6 m (5\' 3" ) 74.8 kg (164 lb 14.5 oz)     Body mass index is 29.21 kg/m.  No intake or output data in the 24 hours ending 05/12/22 1025    General: WD female in no acute distress.  HEENT: perrla, eomi, sclera anicteric  oropharynx clear without lesions, mucous membranes moist  Neck: supple  Cardiovascular: Normal S1 and S2, no murmurs, rubs or gallops  Lungs: clear to auscultation bilaterally, without wheezing, rhonchi, or rales  Abdomen: soft, non-tender, non-distended; normoactive bowel sounds, no rebound or guarding  Extremities: no edema , pulses palpable   Neuro: alert, oriented x 3,   Skin: no rashes or lesions noted  Other:       Labs:     Results       Procedure Component Value Units Date/Time    High Sensitivity Troponin-I at 2 hrs with calculated Delta [[id number]] Collected: 05/12/22 1017    Specimen: Blood Updated: 05/12/22 1017    Lipid panel [[id number]] Collected: 05/12/22 0738    Specimen: Blood Updated: 05/12/22 1001    Narrative:      Fasting specimen    Urinalysis Reflex to Microscopic Exam [[id number]] Collected: 05/12/22 0916    Specimen: Urine Updated: 05/12/22 0932     Urine Type Clean Catch     Color, UA Straw     Clarity, UA Clear     Specific Gravity UA 1.025     Urine pH 7.0     Leukocyte Esterase, UA Negative     Nitrite, UA Negative     Protein, UR Negative     Glucose, UA Negative     Ketones UA Negative     Urobilinogen, UA Negative mg/dL      Bilirubin, UA Negative     Blood, UA Negative     RBC, UA 0 - 2 /hpf      WBC, UA 0 - 5 /hpf     High Sensitivity Troponin-I at 0 hrs [[id number]] Collected: 05/12/22 0738    Specimen:  Blood Updated: 05/12/22 0811     hs Troponin-I <2.7 ng/L     Narrative:      Rescheduled by [id number] at 05/12/2022 07:34 Reason: Unable to Scan   Armband/label - use downtime procedure to collect.    Comprehensive metabolic panel [[id number]]  (Abnormal) Collected: 05/12/22 0738    Specimen: Blood Updated: 05/12/22 0805     Glucose 97 mg/dL      BUN 11.0  mg/dL      Creatinine 1.2 mg/dL      Sodium 284 mEq/L      Potassium 4.3 mEq/L      Chloride 109 mEq/L      CO2 26 mEq/L      Calcium 9.4 mg/dL      Protein, Total 7.2 g/dL      Albumin 4.1 g/dL      AST (SGOT) 25 U/L      ALT 11 U/L      Alkaline Phosphatase 81 U/L      Bilirubin, Total 0.2 mg/dL      Globulin 3.1 g/dL      Albumin/Globulin Ratio 1.3     Anion Gap 10.0     eGFR 46.4 mL/min/1.73 m2     Narrative:      Rescheduled by [id number] at 05/12/2022 07:34 Reason: Unable to Scan   Armband/label - use downtime procedure to collect.    Prothrombin time/INR [[id number]] Collected: 05/12/22 0738    Specimen: Blood Updated: 05/12/22 0753     PT 10.5 sec      PT INR 0.9    Narrative:      Rescheduled by [id number] at 05/12/2022 07:34 Reason: Unable to Scan   Armband/label - use downtime procedure to collect.    APTT [[id number]] Collected: 05/12/22 0738     Updated: 05/12/22 0753     PTT 35 sec     Narrative:      Rescheduled by [id number] at 05/12/2022 07:34 Reason: Unable to Scan   Armband/label - use downtime procedure to collect.    CBC and differential [[id number]]  (Abnormal) Collected: 05/12/22 0738    Specimen: Blood Updated: 05/12/22 0745     WBC 6.10 x10 3/uL      Hgb 10.6 g/dL      Hematocrit 16.6 %      Platelets 359 x10 3/uL      RBC 3.97 x10 6/uL      MCV 85.9 fL      MCH 26.7 pg      MCHC 31.1 g/dL      RDW 16 %      MPV 11.0 fL      Instrument Absolute Neutrophil Count 3.69 x10 3/uL      Neutrophils 60.4 %      Lymphocytes Automated 29.2 %      Monocytes 8.2 %      Eosinophils Automated 1.5 %      Basophils Automated 0.5 %      Immature Granulocytes 0.2 %      Nucleated  RBC 0.0 /100 WBC      Neutrophils Absolute 3.69 x10 3/uL      Lymphocytes Absolute Automated 1.78 x10 3/uL      Monocytes Absolute Automated 0.50 x10 3/uL      Eosinophils Absolute Automated 0.09 x10 3/uL      Basophils Absolute Automated 0.03 x10 3/uL      Immature Granulocytes Absolute 0.01 x10 3/uL      Absolute NRBC 0.00 x10 3/uL     Narrative:      Rescheduled by [id number] at 05/12/2022 07:34 Reason: Unable to Scan   Armband/label - use downtime procedure to collect.    Glucose Whole Blood - POCT [[id number]] Collected: 05/12/22 0656     Updated: 05/12/22 0659     Whole Blood Glucose POCT 94 mg/dL              EKG: not available for review.  Imaging personally reviewed, including: all available   CTA  Head & Neck    Result Date: 05/12/2022   1.No occlusion or hemodynamically significant stenosis of the major intracranial arteries. 2.No hemodynamically significant carotid stenosis. 3.New mild narrowing of distal V2 segment of the left vertebral artery at left C2 neural foramen. Unchanged mild focal narrowing of distal V2 segments of right and left vertebral artery at C4 and C1 level respectively due to degenerative disc disease. 4.CT perfusion without a or evidence of core infarct. 5.Right thyroid lobe and isthmic nodules as detailed above are unchanged. Correlation with dedicated thyroid ultrasound if not previously performed is recommended. 6.Please see body of report for detailed discussion. Comment: Assessment for acute infarct, threshold for ischemia tissue volume is Tmax > 6 s. Threshold for infarct core volume estimated CBF <30 percent. Threshold for poor collateral flow or severe delayed perfusion is Tmax>10 sec. Nelya Ebadirad 05/12/2022 8:27 AM    CT Perfusion Brain    Result Date: 05/12/2022   1.No occlusion or hemodynamically significant stenosis of the major intracranial arteries. 2.No hemodynamically significant carotid stenosis. 3.New mild narrowing of distal V2 segment of the left vertebral artery at  left C2 neural foramen. Unchanged mild focal narrowing of distal V2 segments of right and left vertebral artery at C4 and C1 level respectively due to degenerative disc disease. 4.CT perfusion without a or evidence of core infarct. 5.Right thyroid lobe and isthmic nodules as detailed above are unchanged. Correlation with dedicated thyroid ultrasound if not previously performed is recommended. 6.Please see body of report for detailed discussion. Comment: Assessment for acute infarct, threshold for ischemia tissue volume is Tmax > 6 s. Threshold for infarct core volume estimated CBF <30 percent. Threshold for poor collateral flow or severe delayed perfusion is Tmax>10 sec. Nelya Ebadirad 05/12/2022 8:27 AM    CT Head WO Contrast    Result Date: 05/12/2022  No acute intracranial abnormality. Maylon Cos 05/12/2022 7:15 AM     Safety Checklist  DVT prophylaxis:  CHEST guideline (See page e199S) Chemical  Heparin      This note was generated by the Epic EMR system/ Dragon speech recognition and may contain inherent errors or omissions not intended by the user. Grammatical errors, random word insertions, deletions and pronoun errors  are occasional consequences of this technology due to software limitations. Not all errors are caught or corrected. If there are questions or concerns about the content of this note or information contained within the body of this dictation they should be addressed directly with the author for clarification.    Signed by: Uvaldo Rising, PA, MD   [id number], Collie Siad, MD     Physical exam:   General: No apparent distress.  HEENT: Normocephalic, Conjunctiva pink, anicteric, ENT normal, oropharynx moist.   Neck: Supple, No adenopathy.  Respiratory: Clear to auscultation bilaterally  Cardiovascular: S1 S2 normal, RRR,  intact distal pulses.  Abdominal: Soft, non-tender, no organomegaly, BS positive.  Extremities: No cyanosis or edema.  Neurological: Alert, oriented X 3,   Lymph: No  lymphadenopathy.  Skin: Warm and dry, no suspicious lesions.  Psychiatric: Behavior is appropriate, mood normal, no suicidal ideation     Assessment/plan    76 yo female with pmh of HTN, HLD, Breast CA, hypothyroidism presenting with dizziness, chronic vertigo and left sided weakness.  Will obtain an MRI of the brain to evaluate for CVA, monitor on telemetry, TEE with bubble if MRI positive, Neurology consulted.  Continue Crestor, add ASA.  I have personally obtained history and examined the patient. I have reviewed the clinical data including old records, x-rays, CT Scans, and blood test results. I have discussed the plan of care with the PA/NP, and I agree with their evaluation and management plan as stated in the note.   Sim Boast, MD, FACP  05/12/2022, 11:36 AM      Time spent on patient care for this patient was 76 minutes.      Disclaimer:     This electronic message and its contents and attachments contain information from [facility] and is confidential or otherwise protected from disclosure. The information is intended to be for the addressee only.   If you are not the addressee, any disclosure, copy, distribution or use of the contents of this message is prohibited. If you have received this electronic message in error, please notify us immediately and destroy the original message and all copies.

## 2022-05-13 ENCOUNTER — Other Ambulatory Visit (INDEPENDENT_AMBULATORY_CARE_PROVIDER_SITE_OTHER): Payer: Self-pay

## 2022-05-13 ENCOUNTER — Telehealth (INDEPENDENT_AMBULATORY_CARE_PROVIDER_SITE_OTHER): Payer: Self-pay | Admitting: Internal Medicine

## 2022-05-13 DIAGNOSIS — I10 Essential (primary) hypertension: Secondary | ICD-10-CM

## 2022-05-13 DIAGNOSIS — E782 Mixed hyperlipidemia: Secondary | ICD-10-CM

## 2022-05-13 MED ORDER — ROSUVASTATIN CALCIUM 40 MG PO TABS
40.0000 mg | ORAL_TABLET | Freq: Every day | ORAL | 0 refills | Status: DC
Start: 2022-05-13 — End: 2022-09-17

## 2022-05-13 NOTE — Progress Notes (Addendum)
[facility]  Home Health Referral    Referral from Harmon PierJohnson, Shanice (Case Manager) for home health care upon discharge.    By [employer] law, the patient has the right to freely choose a home care provider.    A company of the patients choosing. We have supplied the patient with a listing of providers in your area who asked to be included and participate in Medicare.   [facility], formerly [facility], a home care agency that provides adult home care services and participates in Medicare   The preferred provider of your insurance company. Choosing a home care provider other than your insurance company's preferred provider may affect your insurance coverage.      Home Health Discharge Information    Your doctor has ordered Physical Therapy and Occupational Therapy in-home service(s) for you while you recuperate at home, to assist you in the transition from hospital to home.    The agency that you or your representative chose to provide the service:  Name of Home Health Agency Placement: [facility]  [phone]      The above services were set up by:  Hoover BrunetteAlfred C Lorie Cleckley, RN [facility](Home Health Liaison) Phone  [phone]      IF YOU HAVE NOT HEARD FROM YOUR HOME YOUR HOME HEALTH AGENCY WITHIN 24-48 HOURS AFTER DISCHARGE PLEASE CALL YOUR AGENCY TO ARRANGE A TIME FOR YOUR FIRST VISIT. FOR ANY SCHEDULING CONCERNS OR QUESTIONS RELATED TO HOME HEALTH, SUCH AS TIME OR DATE PLEASE CONTACT YOUR HOME HEALTH AGENCY AT THE NUMBER LISTED ABOVE.    Additional comments:  History of Presenting Illnes   Aletha HalimMary Beatrice Schleifer is a 76 y.o. female with past medical history significant for hypertension, hyperlipidemia, GERD, hypothyroidism, history of breast cancer presented to the emergency room with complaints of dizziness and chronic vertigo and left-sided weakness.  Patient is alert oriented x 3.  She reports of having dizziness, difficulty walking with the left-sided shaking/weakness that happened  yesterday midday.  She denies of any chest pain or shortness of breath.  Denies of any nausea vomiting or diarrhea.  She presented for further evaluation.         START PATIENT REGISTRATION INFORMATION     Order Information  Order Signing Physician: Raford PitcherBarrett, Charisse MarchLaura D, MD     Service Ordered RN ?: No    Service Ordered PT ?: Yes  Service Ordered OT ?: Yes  Service Ordered ST ?: No    Service Ordered MSW?: No    Service Ordered HHA?: No    Following Physician:   Rondel Batonarter, Stephanie D, MD       General - Internal Medicine     [phone]     Date/Time of Call: 05/13/22 11:55 AM      Care Coordination   [facility]  Primary Care Physician:Stephanie Rejeana Brock Carter, MD  Primary Care Physician [phone]  Primary Care Physician Address: [address] / [place] [id number]  PCP NPI: [id number]  Visit Instructions: N/A  Service Discharge Location Type: Home  Service Facility Name: N/A  Service Floor Facility: N/A  Service Room No: N/A    Demographics  Patient Last Name: Ihde   Patient First Name: [facility]  Language/Communication Barrier: none   Service Address: [address]  [place] [id number]   Service Home Phone: [phone] (home)   Other phone numbers:    Telephone Information:   Mobile [phone]  Emergency Contact: Extended Emergency Contact Information  Primary Emergency Contact: AMOAH,EDWIN  Mobile Phone: [phone]  Relation: Son  Preferred language: English  Interpreter needed? No  Secondary Emergency Contact: Harris,Gloria  Mobile Phone: [phone]  Relation: Cousin    Admission Information  Admit Date: 05/12/2022  Patient Status at discharge: Inpatient  Admitting Diagnosis: Dizziness [R42]     Caregiver Information  Caregiver First Name: N/A  Caregiver Last Name: N/A  Caregiver Relationship to Patient: Child  Caregiver Phone Number: N/A  Caregiver Notes: N/A            Data processing manager Information  Primary Subscriber:   Primary Subscriber  Relation To Guarantor:   Primary Payor:   Primary Plan:   Primary Group #:    Estate agent ID:    Estate agent DOB:   Secondary Insurance Information  Secondary Subscriber:   Secondary Subscriber Relation To Guarantor:   Secondary Payor:   Secondary Plan:   Secondary Group #:   Secondary Subscriber ID:   Secondary Subscriber DOB:   HITECH  NO    END PATIENT REGISTRATION INFORMATION       Diagnosis:   Dizziness R42   Malignant neoplasm of overlapping sites of right female breast C50.811   Hypertension I10   Mixed hyperlipidemia E78.2   Hypothyroid E03.9   Right sided numbness R20.0   COVID-19 ruled out by laboratory testing Z20.822   Abnormal CT scan, gastrointestinal tract R93.3   History of hypothyroidism Z86.39   History of breast cancer Z85.3   History of colonic polyps Z86.010    [facility] Summary        Additional Comments:      Home Health face-to-face (FTF) Encounter (Order [id number])  Consult  Date: 05/13/2022 Department[facility] Ordering: Raford Pitcher Charisse March, MD Authorizing: Uvaldo Rising, PA     Order Information    Order Date/Time Release Date/Time Start Date/Time End Date/Time   05/13/22 10:01 AM None 05/12/22 12:00 AM 05/12/22 12:00 AM     Order Details    Frequency Duration Priority Order Class   Once 1  occurrence STAT Hospital Performed     Standing Order Information    Remaining Occurrences Interval Last Released     0/1 Once 05/13/2022              Provider Information    Ordering User Ordering Provider Authorizing Provider   Hoover Brunette, RN Barrett, Charisse March, MD Uvaldo Rising, [place]   Attending Provider(s) Admitting Provider PCP   Georgana Curio, MD; Raford Pitcher, Charisse March, MD Arlyn Dunning, MD Rondel Baton, MD     Verbal Order Info    Action Created on Order Mode Entered by Responsible Provider Signed by Signed on   Ordering 05/13/22 1001 Telephone with readback Hoover Brunette, RN Barrett, Charisse March, MD             Comments    Home nursing required for  skilled assessment including cardiopulmonary assessment and dietary education for disease management, and medication instruction. Home PT/OT required for gait and balance training, strengthening, mobility, fall prevention, and ADL training.       Dizziness R42   Malignant neoplasm of overlapping sites of right female breast C50.811   Hypertension I10   Mixed hyperlipidemia E78.2   Hypothyroid E03.9   Right sided numbness R20.0   COVID-19 ruled out by laboratory testing Z20.822   Abnormal CT scan, gastrointestinal tract R93.3  History of hypothyroidism Z86.39   History of breast cancer Z85.3   History of colonic polyps Z86.010                Home Health face-to-face (FTF) Encounter: Patient Communication     Not Released  Not seen         Order Questions    Question Answer   Date I saw the patient face-to-face: 05/12/2022   Evidence this patient is homebound because: C.  Decreased endurance, strength, ROM, cadence, safety/judgment during mobility    B.  Profound weakness, poor balance/unsteady gait d/t illness/treatment/procedure    F.  Deconditioned due to advance disease process requiring assistance to leave home   Medical conditions that necessitate Home Health care: B.  Functional impairment due to recent hospitalization/procedure/treatment    C.  Risk for complication/infection/pain requiring follow up and monitoring   Per clinical findings, following services are medically necessary: PT    OT   Clinical findings that support the need for Physical Therapy. PT will C.  Educate on weight bearing status, stair/gait training, balance & coordination    D.  Provide services to help restore function, mobility, and releive pain    A.  Evaluate and treat functional impairment and improve mobility    E.  Educate on functional mobility; bed, chair, sit, stand and transfer activities    F.  Perform home safety assessment & develop safe in home exercise program    G.  Implement activities to improve stance time, cadence &  step length    H.  Educate on the safe use of assistive device/ durable medical equipment    I.  Instruct on restorative activities to restore ability to perform ADL   OT will provide assistance with: Home program to improve ability to perform ADLs    Recovery and maintenance skills    Basic motor function and reasoning abilities    Restorative program to improve mobility and independence    Strategies to compensate for loss of function   Other (please specify) See comment section for orders                    Process Instructions    Please select Home Care Services medically necessary.     Based on the above findings, I certify that this patient is confined to the home and needs intermittent skilled nursing care, physical therapry and / or speech therapy or continues to need occupational therapy. The patient is under my care, and I have initiated the establishment of the plan of care. This patient will be followed by a physician who will periodically review the plan of care.      Collection Information            Consult Order Info    ID Description Priority Start Date Start Time   [id number] Home Health face-to-face (FTF) Encounter STAT 05/12/2022 12:00 AM   Provider Specialty Referred to   ______________________________________ _____________________________________                         Verbal Order Info    Action Created on Order Mode Entered by Responsible Provider Signed by Signed on   Ordering 05/13/22 1001 Telephone with readback Hoover Brunette, RN Barrett, Charisse March, MD             Patient Information    Patient Name   Somma, Deedra Ehrich Legal Sex   Female  DOB   16-Sep-1946       Reprint Order Requisition    Home Health face-to-face (FTF) Encounter (Order #[id number]) on 05/13/22       Additional Information    Associated Reports External References   Priority and Order Details [employer]              [facility]  [facility]     Patient Name: YALITZA, TEED     MRN: [id number]       CSN: [id number]         Account Information     Hosp Acct #   [id number] Patient Class   Observation Service  Emergency Medicine Accommodation Code  Semi-Private      Admission Information     Admitting Physician:  Attending Physician: Arlyn Dunning, MD    Unit  [facility]* L&D Status      Admitting Diagnosis: Dizziness Room / Bed  E 33/E33 L&D - Last Menstrual Cycle      Chief Complaint: Dizziness; Extremity Wea*     Admit Type:  Admit Date/Time:  Discharge Date/Time: Emergency  05/12/2022 / 8657  05/12/2022 / 1830 Length of Stay: 0 Days    L&D EDD   Estimated Date of Delivery: None noted.      Patient Information              Home Address: [address]  [address]  [place] [address] Employer:  Employer Address:       ,     Main Phone: [phone] Employer Phone:     SSN: [id number]       DOB: December 15, 1945 (75 yrs)       Sex: Female Primary Care Physician: Rondel Baton, MD   Marital Status: Widowed Referring Physician:       No ref. provider found   Race: Black or African American       Ethnicity: Not of Hispanic/Latino/S*       Emergency Contacts  Name Home Phone Work Phone Mobile Phone Relationship Blane Ohara     [phone] Son No   Milagros Reap     [phone] [facility]           Guarantor Information     Guarantor Name: HAVANNA, GRONER Guarantor ID: [id number]   Guarantor Relationship to Pt: Self Guarantor Type: Personal/Family   Guarantor DOB:    [birth year]       Guarantor Address: [address]  [place], [place] [address]          Guarantor Home Phone: [phone] Guarantor Employer:        Guarantor Work Phone:   Pharmacologist Phone:                     [employer]     Insurance Name: MEDICARE [facility] MEDICARE [employer] Subscriber Name: [employer]   Insurance Address:    [address] [address]  [place], [place] [address] Subscriber DOB: 1946-03-28      Subscriber ID: [id number]   Insurance Phone: [phone] Pt Relationship to Sub:   Self   Insurance  ID:         Group Name:   Preauthorization #: [id number]   Group #: [id number] Preauthorization Days:        Secondary Insurance     Insurance Name: - Subscriber Name:     Insurance Address:       ,   Subscriber DOB:  Subscriber ID:     Press photographer:   Pt Relationship to Sub:       Insurance ID:         Group Name:   Preauthorization #:     Group #:   Preauthorization Days:        [employer] Name: - Subscriber Name:     Community education officer Address:       ,   Statistician DOB:        Subscriber ID:     Press photographer:   Pt Relationship to Sub:       Insurance ID:         Group Name:   Preauthorization #:     Group #:   Preauthorization Days:           05/13/2022 10:13 AM           End PACC Summary     Discharge Date:  05/12/2022    Referral Source  Signed by: Hoover Brunette, RN  Date Time: 05/13/22 11:55 AM      End PACC Note

## 2022-05-13 NOTE — Telephone Encounter (Signed)
Name, strength, directions of requested refill(s):    Rosuvastatin 40 mg    Pharmacy to send refill to or patient to pick up rx from office (mark requested pharmacy in [place]):      [facility] [address], [place] - [address]  [address]  [place] [address]  Phone: [phone] Fax: [phone]        Patient Preferred Callback Number: [phone]        Next visit: 06/07/2022      [facility]  [facility]

## 2022-05-13 NOTE — Telephone Encounter (Signed)
Med sent to pts preferred pharmacy.

## 2022-05-27 ENCOUNTER — Other Ambulatory Visit: Payer: Self-pay | Admitting: Internal Medicine

## 2022-06-01 ENCOUNTER — Ambulatory Visit
Admission: RE | Admit: 2022-06-01 | Discharge: 2022-06-01 | Disposition: A | Discharge status: Home / Self Care | Payer: Medicare Other | Source: Ambulatory Visit | Attending: Internal Medicine | Admitting: Internal Medicine

## 2022-06-01 DIAGNOSIS — N2889 Other specified disorders of kidney and ureter: Secondary | ICD-10-CM

## 2022-06-02 ENCOUNTER — Ambulatory Visit: Payer: Medicare Other

## 2022-06-02 ENCOUNTER — Other Ambulatory Visit: Payer: Self-pay | Admitting: Internal Medicine

## 2022-06-02 DIAGNOSIS — N2889 Other specified disorders of kidney and ureter: Secondary | ICD-10-CM

## 2022-06-03 ENCOUNTER — Ambulatory Visit
Admission: RE | Admit: 2022-06-03 | Discharge: 2022-06-03 | Disposition: A | Discharge status: Home / Self Care | Payer: Medicare Other | Source: Ambulatory Visit | Attending: Internal Medicine | Admitting: Internal Medicine

## 2022-06-03 DIAGNOSIS — N2889 Other specified disorders of kidney and ureter: Secondary | ICD-10-CM | POA: Insufficient documentation

## 2022-06-03 MED ORDER — IOHEXOL 350 MG/ML IV SOLN
100.0000 mL | Freq: Once | INTRAVENOUS | Status: AC | PRN
Start: 2022-06-03 — End: 2022-06-03
  Administered 2022-06-03: 100 mL via INTRAVENOUS

## 2022-06-07 ENCOUNTER — Encounter (INDEPENDENT_AMBULATORY_CARE_PROVIDER_SITE_OTHER): Payer: Self-pay | Admitting: Internal Medicine

## 2022-06-07 ENCOUNTER — Ambulatory Visit (INDEPENDENT_AMBULATORY_CARE_PROVIDER_SITE_OTHER): Payer: Medicare Other | Admitting: Internal Medicine

## 2022-06-07 VITALS — BP 124/60 | HR 81 | Ht 62.9 in | Wt 150.4 lb

## 2022-06-07 DIAGNOSIS — E782 Mixed hyperlipidemia: Secondary | ICD-10-CM

## 2022-06-07 DIAGNOSIS — R9431 Abnormal electrocardiogram [ECG] [EKG]: Secondary | ICD-10-CM

## 2022-06-07 DIAGNOSIS — I10 Essential (primary) hypertension: Secondary | ICD-10-CM

## 2022-06-07 DIAGNOSIS — R0789 Other chest pain: Secondary | ICD-10-CM

## 2022-06-07 LAB — ECG 12-LEAD
Atrial Rate: 60 {beats}/min
P Axis: 4 degrees
P-R Interval: 184 ms
Q-T Interval: 410 ms
QRS Duration: 96 ms
QTC Calculation (Bezet): 410 ms
R Axis: -5 degrees
T Axis: 27 degrees
Ventricular Rate: 60 {beats}/min

## 2022-06-07 MED ORDER — EZETIMIBE 10 MG PO TABS
10.0000 mg | ORAL_TABLET | Freq: Every day | ORAL | 1 refills | Status: DC
Start: 2022-06-07 — End: 2022-09-17

## 2022-06-07 NOTE — Progress Notes (Signed)
[facility] OFFICE CONSULTATION    I had the pleasure of seeing Emily [name] today for cardiovascular evaluation. She is a pleasant 76 y.o. female with a history of hypertension, hyperlipidemia who presents for follow-up.  She was admitted to [facility] 05/12/2022 with dizziness.  She was seen by neurology.  She had MRI brain which showed no significant finding.  She was diagnosed with vertigo.  She was also orthostatic on admission, resolved prior to discharge with IV fluid hydration.  Medications were not adjusted.  She feels well since discharge.  She has not had recurrence of dizziness.  Occasional left-sided chest pain but at rest.  Exercise capacity is limited by back pain.  She denies shortness of breath, palpitations, syncope.          PAST MEDICAL HISTORY: She has a past medical history of Breast cancer (2014), Exercise-induced angina, GERD (gastroesophageal reflux disease), Headache, Hyperlipidemia, Hypertension, Hypothyroidism, Low back pain, Malignant neoplasm of overlapping sites of right female breast (10/19/2015), Syncope and collapse, and Vertigo. She has a past surgical history that includes Mastectomy (Right, 2015); Hysterectomy; EGD, BIOPSY (N/A, 07/21/2020); COLONOSCOPY, POLYPECTOMY (N/A, 07/21/2020); Colonoscopy (2021); LAPAROSCOPY, DIAGNOSTIC (N/A, 05/22/2021); EXPLORATORY LAPAROTOMY (N/A, 05/22/2021); and LAPAROTOMY, COLECTOMY, RIGHT (Right, 05/22/2021).        MEDICATIONS:   Current Outpatient Medications   Medication Sig Dispense Refill    amLODIPine (NORVASC) 10 MG tablet Take 1 tablet (10 mg total) by mouth daily 90 tablet 1    anastrozole (ARIMIDEX) 1 MG tablet Take 1 tablet (1 mg total) by mouth every morning 30 tablet 0    aspirin 81 MG chewable tablet Chew 1 tablet (81 mg total) by mouth daily 90 tablet 0    diclofenac Sodium (VOLTAREN) 1 % Gel topical gel Apply 2 g topically 4 (four) times daily 50 g 0    Ferrous Sulfate (IRON PO) Take by mouth      gabapentin  (NEURONTIN) 100 MG capsule Take 1 capsule (100 mg total) by mouth 2 (two) times daily as needed (Neuropathy) 60 capsule 0    levothyroxine (SYNTHROID) 25 MCG tablet Take 1 tablet (25 mcg total) by mouth every morning 30 tablet 0    lisinopril (ZESTRIL) 40 MG tablet Take 1 tablet (40 mg total) by mouth daily 90 tablet 1    meclizine (ANTIVERT) 25 MG tablet Take 1 tablet (25 mg) by mouth 3 (three) times daily as needed for Dizziness 10 tablet 0    Multiple Vitamins-Minerals (MULTIVITAMIN WITH MINERALS) tablet Take 1 tablet by mouth every morning      rosuvastatin (CRESTOR) 40 MG tablet Take 1 tablet (40 mg) by mouth daily 30 tablet 0     No current facility-administered medications for this visit.           ALLERGIES:   Allergies   Allergen Reactions    Morphine Itching    Morphine And Related Itching and Nausea And Vomiting     dizzy         FAMILY HISTORY: Her family history is not on file.      SOCIAL HISTORY: She reports that she has never smoked. She has never used smokeless tobacco. She reports that she does not currently use alcohol. She reports that she does not use drugs.      REVIEW OF SYSTEMS: All other systems reviewed and negative except as above.         PHYSICAL EXAMINATION  General Appearance:  A well-appearing female in no  acute distress.    Vital Signs: BP 124/60 (BP Site: Left arm, Patient Position: Sitting, Cuff Size: Medium)   Pulse 81   Ht 1.598 m (5' 2.9")   Wt 68.2 kg (150 lb 6.4 oz)   SpO2 98%   BMI 26.73 kg/m    HEENT: Sclera anicteric, conjunctiva without pallor, moist mucous membranes, normal dentition. No arcus.   Neck:  Supple without jugular venous distention. Thyroid nonpalpable. Normal carotid upstrokes without bruits.   Chest: Clear to auscultation bilaterally with good air movement and respiratory effort and no wheezes, rales, or rhonchi   Cardiovascular: Normal S1 and physiologically split S2 without murmurs. No gallops or rub. PMI of normal size and nondisplaced.   Abdomen:  Soft, nontender, nondistended, with normoactive bowel sounds. No organomegaly.  No pulsatile masses, or bruits.   Extremities: Warm without edema  Skin: No rash, xanthoma or xanthelasma.   Neuro: Alert and oriented x3. Grossly intact. Strength is symmetrical. Normal mood and affect.           ECG: Today, I have independently reviewed the tracing, sinus rhythm, PACs, LVH            LABS:   Lab Results   Component Value Date    WBC 6.10 05/12/2022    HGB 10.6 (L) 05/12/2022    HCT 34.1 (L) 05/12/2022    PLT 359 (H) 05/12/2022    NA 145 05/12/2022    K 4.3 05/12/2022    MG 2.3 02/15/2022    BUN 11.0 05/12/2022    CREAT 1.2 (H) 05/12/2022    GLU 97 05/12/2022    AST 25 05/12/2022    ALT 11 05/12/2022    HGBA1C 5.4 01/14/2022    TSH 0.56 03/08/2021    TROPI <2.7 05/12/2022    TROPI Unable toCalc. 05/12/2022    DDIMER 3.21 (H) 10/15/2021    BNP 129 02/15/2022     Recent Labs     05/12/22  0738 01/15/22  0450 03/09/21  0554 09/28/20  0530 05/29/20  0516   Cholesterol 265* 226* 196 216* 211*  213*   Triglycerides 61 62 50 93 91  91   HDL 72 61 61 61 61  60   LDL Calculated 181* 153* 125* 136* 132*  135*          ECHOCARDIOGRAM: 01/28/2020       Summary    * Left ventricular ejection fraction is normal with an estimated ejection  fraction of 60-65%.    * Left ventricular wall thickness is mildly increased.    * No significant valvular dysfunction.           Nuclear stress test 04/17/2019    Summary    1. Normal stress and rest myocardial perfusion study without evidence of  inducible ischemia or scar.    2. Normal left ventricular function with calculated LVEF 77 %.    3. No prior studies are available for comparison.        IMPRESSION/RECOMMENDATIONS:     Chest pain -at rest, no obvious exertional component.  Nuclear stress test was ordered at previous visit, not yet done.  Will reorder Lexiscan nuclear stress test.  She is unable to walk the treadmill due to back pain.    HTN -adequately controlled.  Continue with  amlodipine and lisinopril.  Advised low-sodium diet.  Stable renal function potassium 4.3 BUN 11 creatinine 1.2.  LVH on ECG, will get an echocardiogram for EF and left ventricular wall  thickness.    HLD -remains poorly controlled, LDL has increased from 153 to 181.  Goal of less than 100 discussed.  She does not follow a low-cholesterol diet.  Counseled on low-cholesterol diet.  Weight loss.  Advise activity as tolerated.  She reports being compliant with rosuvastatin 40 mg p.o. daily.  She will bring all medications to her follow-up visit.  Stable AST and ALT 25 and 11.  We will add Zetia 10 mg p.o. daily.  Eventual repeat fasting lipid panel.    Anemia -hemoglobin 10.6.  Advised evaluation and management per PCP.    Dizziness -requiring hospital admission 05/2022.  Dizziness has resolved.  Admission records from hospitalist and neurology reviewed.  High-sensitivity troponin was negative x2, less than 2.7.  No acute findings on MRI brain.  No significant carotid artery stenosis on CTA neck.  Found to be mildly orthostatic on admission, resolved prior to discharge with IV fluid hydration.  Will repeat echocardiogram.    Follow-up in 3 months.        Rich Number, MD  06/07/2022, 1:15 PM  [facility]  [phone]

## 2022-06-07 NOTE — Patient Instructions (Signed)
Please bring all of your medications to your follow up visit.

## 2022-06-16 ENCOUNTER — Ambulatory Visit
Admission: RE | Admit: 2022-06-16 | Discharge: 2022-06-16 | Disposition: A | Discharge status: Home / Self Care | Payer: Medicare Other | Source: Ambulatory Visit | Attending: Hematology & Oncology | Admitting: Hematology & Oncology

## 2022-06-16 DIAGNOSIS — C50919 Malignant neoplasm of unspecified site of unspecified female breast: Secondary | ICD-10-CM | POA: Insufficient documentation

## 2022-06-16 LAB — COMPREHENSIVE METABOLIC PANEL
ALT: 20 U/L (ref 0–55)
AST (SGOT): 30 U/L (ref 5–41)
Albumin/Globulin Ratio: 1.4 (ref 0.9–2.2)
Albumin: 4 g/dL (ref 3.5–5.0)
Alkaline Phosphatase: 81 U/L (ref 37–117)
Anion Gap: 11 (ref 5.0–15.0)
BUN: 13 mg/dL (ref 7.0–21.0)
Bilirubin, Total: 0.3 mg/dL (ref 0.2–1.2)
CO2: 24 mEq/L (ref 17–29)
Calcium: 9 mg/dL (ref 7.9–10.2)
Chloride: 107 mEq/L (ref 99–111)
Creatinine: 1.1 mg/dL — ABNORMAL HIGH (ref 0.4–1.0)
Globulin: 2.8 g/dL (ref 2.0–3.6)
Glucose: 90 mg/dL (ref 70–100)
Potassium: 4.5 mEq/L (ref 3.5–5.3)
Protein, Total: 6.8 g/dL (ref 6.0–8.3)
Sodium: 142 mEq/L (ref 135–145)
eGFR: 51.7 mL/min/{1.73_m2} — AB (ref >=60)

## 2022-06-16 LAB — CBC AND DIFFERENTIAL
Absolute NRBC: 0 10*3/uL (ref 0.00–0.00)
Basophils Absolute Automated: 0.02 10*3/uL (ref 0.00–0.08)
Basophils Automated: 0.4 %
Eosinophils Absolute Automated: 0.06 10*3/uL (ref 0.00–0.44)
Eosinophils Automated: 1.1 %
Hematocrit: 34.1 % — ABNORMAL LOW (ref 34.7–43.7)
Hgb: 10.5 g/dL — ABNORMAL LOW (ref 11.4–14.8)
Immature Granulocytes Absolute: 0.02 10*3/uL (ref 0.00–0.07)
Immature Granulocytes: 0.4 %
Instrument Absolute Neutrophil Count: 3.06 10*3/uL (ref 1.10–6.33)
Lymphocytes Absolute Automated: 1.72 10*3/uL (ref 0.42–3.22)
Lymphocytes Automated: 32.2 %
MCH: 26.4 pg (ref 25.1–33.5)
MCHC: 30.8 g/dL — ABNORMAL LOW (ref 31.5–35.8)
MCV: 85.7 fL (ref 78.0–96.0)
MPV: 10.3 fL (ref 8.9–12.5)
Monocytes Absolute Automated: 0.46 10*3/uL (ref 0.21–0.85)
Monocytes: 8.6 %
Neutrophils Absolute: 3.06 10*3/uL (ref 1.10–6.33)
Neutrophils: 57.3 %
Nucleated RBC: 0 /100 WBC (ref 0.0–0.0)
Platelets: 288 10*3/uL (ref 142–346)
RBC: 3.98 10*6/uL (ref 3.90–5.10)
RDW: 14 % (ref 11–15)
WBC: 5.34 10*3/uL (ref 3.10–9.50)

## 2022-06-16 LAB — VITAMIN B12: Vitamin B-12: 1253 pg/mL — ABNORMAL HIGH (ref 211–911)

## 2022-06-16 LAB — HEMOLYSIS INDEX: Hemolysis Index: 21 Index (ref 0–24)

## 2022-06-16 LAB — IRON PROFILE
Iron Saturation: 20 % (ref 15–50)
Iron: 61 ug/dL (ref 32–157)
TIBC: 298 ug/dL (ref 265–497)
UIBC: 237 ug/dL (ref 126–382)

## 2022-06-16 LAB — FOLATE: Folate: 16.3 ng/mL

## 2022-06-16 LAB — FERRITIN: Ferritin: 17.6 ng/mL (ref 4.60–204.00)

## 2022-06-16 LAB — TRANSFERRIN: Transferrin: 263 mg/dL (ref 174–382)

## 2022-06-30 ENCOUNTER — Emergency Department: Payer: Medicare Other

## 2022-06-30 ENCOUNTER — Emergency Department
Admission: EM | Admit: 2022-06-30 | Discharge: 2022-06-30 | Disposition: A | Discharge status: Home / Self Care | Payer: Medicare Other | Attending: Emergency Medicine | Admitting: Emergency Medicine

## 2022-06-30 DIAGNOSIS — R42 Dizziness and giddiness: Secondary | ICD-10-CM | POA: Insufficient documentation

## 2022-06-30 DIAGNOSIS — K5900 Constipation, unspecified: Secondary | ICD-10-CM | POA: Insufficient documentation

## 2022-06-30 DIAGNOSIS — R55 Syncope and collapse: Secondary | ICD-10-CM

## 2022-06-30 DIAGNOSIS — K625 Hemorrhage of anus and rectum: Secondary | ICD-10-CM | POA: Insufficient documentation

## 2022-06-30 LAB — COMPREHENSIVE METABOLIC PANEL
ALT: 21 U/L (ref 0–55)
AST (SGOT): 32 U/L (ref 5–41)
Albumin/Globulin Ratio: 1.4 (ref 0.9–2.2)
Albumin: 4.1 g/dL (ref 3.5–5.0)
Alkaline Phosphatase: 84 U/L (ref 37–117)
Anion Gap: 10 (ref 5.0–15.0)
BUN: 12 mg/dL (ref 7.0–21.0)
Bilirubin, Total: 0.2 mg/dL (ref 0.2–1.2)
CO2: 25 mEq/L (ref 17–29)
Calcium: 9.2 mg/dL (ref 7.9–10.2)
Chloride: 109 mEq/L (ref 99–111)
Creatinine: 1.1 mg/dL — ABNORMAL HIGH (ref 0.4–1.0)
Globulin: 3 g/dL (ref 2.0–3.6)
Glucose: 86 mg/dL (ref 70–100)
Potassium: 4.6 mEq/L (ref 3.5–5.3)
Protein, Total: 7.1 g/dL (ref 6.0–8.3)
Sodium: 144 mEq/L (ref 135–145)
eGFR: 51.7 mL/min/{1.73_m2} — AB (ref >=60)

## 2022-06-30 LAB — ECG 12-LEAD
Atrial Rate: 50 {beats}/min
P Axis: 42 degrees
P-R Interval: 218 ms
Q-T Interval: 418 ms
QRS Duration: 96 ms
QTC Calculation (Bezet): 381 ms
R Axis: 26 degrees
T Axis: 17 degrees
Ventricular Rate: 50 {beats}/min

## 2022-06-30 LAB — CBC AND DIFFERENTIAL
Absolute NRBC: 0 10*3/uL (ref 0.00–0.00)
Basophils Absolute Automated: 0.04 10*3/uL (ref 0.00–0.08)
Basophils Automated: 0.6 %
Eosinophils Absolute Automated: 0.15 10*3/uL (ref 0.00–0.44)
Eosinophils Automated: 2.3 %
Hematocrit: 34.9 % (ref 34.7–43.7)
Hgb: 10.8 g/dL — ABNORMAL LOW (ref 11.4–14.8)
Immature Granulocytes Absolute: 0.01 10*3/uL (ref 0.00–0.07)
Immature Granulocytes: 0.2 %
Instrument Absolute Neutrophil Count: 3.1 10*3/uL (ref 1.10–6.33)
Lymphocytes Absolute Automated: 2.69 10*3/uL (ref 0.42–3.22)
Lymphocytes Automated: 41.1 %
MCH: 27.1 pg (ref 25.1–33.5)
MCHC: 30.9 g/dL — ABNORMAL LOW (ref 31.5–35.8)
MCV: 87.5 fL (ref 78.0–96.0)
MPV: 11.6 fL (ref 8.9–12.5)
Monocytes Absolute Automated: 0.56 10*3/uL (ref 0.21–0.85)
Monocytes: 8.5 %
Neutrophils Absolute: 3.1 10*3/uL (ref 1.10–6.33)
Neutrophils: 47.3 %
Nucleated RBC: 0 /100 WBC (ref 0.0–0.0)
Platelets: 298 10*3/uL (ref 142–346)
RBC: 3.99 10*6/uL (ref 3.90–5.10)
RDW: 14 % (ref 11–15)
WBC: 6.55 10*3/uL (ref 3.10–9.50)

## 2022-06-30 LAB — URINALYSIS WITH REFLEX TO MICROSCOPIC EXAM - REFLEX TO CULTURE
Bilirubin, UA: NEGATIVE
Blood, UA: NEGATIVE
Glucose, UA: NEGATIVE
Ketones UA: NEGATIVE
Leukocyte Esterase, UA: NEGATIVE
Nitrite, UA: NEGATIVE
Protein, UR: NEGATIVE
Specific Gravity UA: 1.006 (ref 1.001–1.035)
Urine pH: 7 (ref 5.0–8.0)
Urobilinogen, UA: NEGATIVE mg/dL (ref 0.2–2.0)

## 2022-06-30 LAB — WHOLE BLOOD GLUCOSE POCT: Whole Blood Glucose POCT: 101 mg/dL — ABNORMAL HIGH (ref 70–100)

## 2022-06-30 LAB — HIGH SENSITIVITY TROPONIN-I: hs Troponin-I: <2.7 ng/L

## 2022-06-30 LAB — STOOL OCCULT BLOOD: Stool Occult Blood: POSITIVE — AB

## 2022-06-30 NOTE — EDIE (Signed)
[facility]?NOTIFICATION?06/30/2022 10:31?Kaelyn, Innocent B?MRN: [id number]    [facility] [facility]'s patient encounter information:   VWU:?[id number]  Account [id number]  Billing Account [id number]      Criteria Met      5 ED Visits in 12 Months    Security and Safety  No Security Events were found.  ED Care Guidelines  There are currently no ED Care Guidelines for this patient. Please check your facility's medical records system.        Prescription Monitoring Program  000??- Narcotic Use Score  000??- Sedative Use Score  000??- Stimulant Use Score  000??- Overdose Risk Score  - All Scores range from 000-999 with 75% of the population scoring < 200 and on 1% scoring above 650  - The last digit of the narcotic, sedative, and stimulant score indicates the number of active prescriptions of that type  - Higher Use scores correlate with increased prescribers, pharmacies, mg equiv, and overlapping prescriptions  - Higher Overdose Risk Scores correlate with increased risk of unintentional overdose death   Concerning or unexpectedly high scores should prompt a review of the PMP record; this does not constitute checking PMP for prescribing purposes.    E.D. Visit Count (12 mo.)  Facility Visits   [place] [facility] 8   Total 8   Note: Visits indicate total known visits.     Recent Emergency Department Visit Summary  Date Facility [facility] Type Diagnoses or Chief Complaint    Jun 30, 2022  [place] [facility]  [place]  [place]  Emergency      MEDIC #[id number]      May 12, 2022  [place] [facility]  [place]  [place]  Emergency      Extremity Weakness      Dizziness      Dizziness, weakness, left side uneasy      Feb 15, 2022  [place] [facility]  [place]  [place]  Emergency      Chest pain, unspecified      Generalized abdominal pain      Weakness      Generalized Weakness      Jan 14, 2022  [place] [facility]  [place]  [place]  Emergency      Unspecified symptoms and signs involving the nervous system       Dizziness and giddiness      Unspecified visual loss      Cerebrovascular Accident      L side numbness      Nov 30, 2021  [place] [facility]  [place]  [place]  Emergency      Pulmonary fibrosis, unspecified      Dizziness and giddiness      Fatigue      Weakness; Fatigue      Nov 17, 2021  [place] [facility]  [place]  [place]  Emergency      Calculus of gallbladder without cholecystitis without obstruction      Unspecified abdominal pain      Dysuria      Abdominal Pain      Nausea      Scar Tissue Pain      Pain in right side, breast cancer patient      Nov 11, 2021  [place] [facility]  [place]  [place]  Emergency      Nontoxic single thyroid nodule      Paresthesia of skin  Chest pain, unspecified      Acute kidney failure, unspecified      Syncope and collapse      Dizziness and giddiness      Syncope      Chest Pain      chest pain,dizziness,weakness      Oct 15, 2021  [place] [facility]  [place]  [place]  Emergency      Other chest pain      Palpitations      palpitations shortness of breath        Recent Inpatient Visit Summary  Date Facility [facility] Type Diagnoses or Chief Complaint    Jan 14, 2022  [place] [facility]  [place]  [place]  Medical Surgical      Dizziness and giddiness      Unspecified symptoms and signs involving the nervous system      Unspecified visual loss        Care Team  Provider Specialty Phone Fax Service Dates   [place], [facility] , DO Internal Medicine   Current    Rondel Baton, M.D. Internal Medicine   Current    DAVIDSON, Janet Berlin MD Judie Petit, MD Family Medicine: Adult Medicine [phone] Current    Santa Lighter, M.D. Internal Medicine   Current    Cornelius Moras, NP Nurse Practitioner: Adult Health   Current    Sandria Bales Nurse Practitioner: Gerontology [phone]  Current      [facility]  This patient has registered at the [facility] Emergency Department   For more information visit:  [email or web address]     PLEASE NOTE:     1.   Any care recommendations and other clinical information are provided as guidelines or for historical purposes only, and providers should exercise their own clinical judgment when providing care.    2.   You may only use this information for purposes of treatment, payment or health care operations activities, and subject to the limitations of applicable [facility] Policies.    3.   You should consult directly with the organization that provided a care guideline or other clinical history with any questions about additional information or accuracy or completeness of information provided.    ? 2023 [facility] - [email or web address]

## 2022-06-30 NOTE — ED Notes (Signed)
Bed: E20  Expected date:   Expected time:   Means of arrival:   Comments:  Medic 409

## 2022-06-30 NOTE — ED Provider Notes (Signed)
EMERGENCY DEPARTMENT NOTE     Patient initially seen and examined at   ED PHYSICIAN ASSIGNED       None           ED MIDLEVEL (APP) ASSIGNED       Date/Time Event User Comments    06/30/22 1102 PA/NP Provider Assigned Experiment, Sophronia Simas, NP assigned as Nurse Practitioner            HISTORY OF PRESENT ILLNESS   Translator Used : No    Chief Complaint: Abdominal Pain, Hematuria, Rectal Bleeding, Dizziness, and Headache       76 y.o. female with past medical history as below presents with dizziness, left side sensation change, abd pain and hematuria/rectal bleeding. Pt has had several visits for similar symptoms related to Dizziness and left sided sensation/weakness in the past with a MRI in 05/2022 that had no significant acute abnormality. Pt unable to provide good timeline today as to when symptoms worsened/started. Pt c/o of bright red blood in her urine and stool that stated today. Not large amounts and pt states abd pain is generalized and waxes and wanes.     Independent Historian (other than patient): No  Additional History Provided by Independent Historian:  MEDICAL HISTORY     Past Medical History:  Past Medical History:   Diagnosis Date    Breast cancer 2014    right breast mastectomy    Exercise-induced angina     negative work up    GERD (gastroesophageal reflux disease)     Headache     resolved    Hyperlipidemia     Hypertension     Hypothyroidism     Low back pain     Malignant neoplasm of overlapping sites of right female breast 10/19/2015    Syncope and collapse     Chronic Vertigo    Vertigo        Past Surgical History:  Past Surgical History:   Procedure Laterality Date    COLONOSCOPY  2021    COLONOSCOPY, POLYPECTOMY N/A 07/21/2020    Procedure: COLONOSCOPY, POLYPECTOMY;  Surgeon: Einar Grad, MD;  Location: [facility];  Service: Gastroenterology;  Laterality: N/A;    EGD, BIOPSY N/A 07/21/2020    Procedure: EGD, BIOPSY;  Surgeon: Einar Grad, MD;  Location: [facility];  Service:  Gastroenterology;  Laterality: N/A;    EXPLORATORY LAPAROTOMY N/A 05/22/2021    Procedure: EXPLORATORY LAPAROTOMY;  Surgeon: Delena Serve, MD;  Location: [facility];  Service: General;  Laterality: N/A;    HYSTERECTOMY      LAPAROSCOPY, DIAGNOSTIC N/A 05/22/2021    Procedure: LAPAROSCOPY, DIAGNOSTIC;  Surgeon: Delena Serve, MD;  Location: [facility];  Service: General;  Laterality: N/A;    LAPAROTOMY, COLECTOMY, RIGHT Right 05/22/2021    Procedure: LAPAROTOMY, COLECTOMY, RIGHT;  Surgeon: Delena Serve, MD;  Location: [facility];  Service: General;  Laterality: Right;    MASTECTOMY Right 2015       Social History:  Social History     Socioeconomic History    Marital status: Widowed   Tobacco Use    Smoking status: Never    Smokeless tobacco: Never   Vaping Use    Vaping Use: Never used   Substance and Sexual Activity    Alcohol use: Yes     Comment: occ    Drug use: No    Sexual activity: Not Currently  Social Determinants of Health     Financial Resource Strain: Low Risk  (05/12/2022)    Overall Financial Resource Strain (CARDIA)     Difficulty of Paying Living Expenses: Not hard at all   Transportation Needs: No Transportation Needs (05/12/2022)    PRAPARE - Therapist, art (Medical): No     Lack of Transportation (Non-Medical): No   Housing Stability: Unknown (05/12/2022)    Housing Stability Vital Sign     Unable to Pay for Housing in the Last Year: No     Unstable Housing in the Last Year: No       Family History:  Family History   Problem Relation Age of Onset    Breast cancer Neg Hx        Outpatient Medication:  Discharge Medication List as of 06/30/2022  2:55 PM        CONTINUE these medications which have NOT CHANGED    Details   amLODIPine (NORVASC) 10 MG tablet Take 1 tablet (10 mg total) by mouth daily, Starting Fri 04/02/2021, E-Rx      anastrozole (ARIMIDEX) 1 MG tablet Take 1 tablet (1 mg total) by mouth every morning, Starting Tue  06/01/2021, Normal      aspirin 81 MG chewable tablet Chew 1 tablet (81 mg total) by mouth daily, Starting Wed 01/29/2020, E-Rx      celecoxib (CeleBREX) 200 MG capsule Take 1 capsule (200 mg) by mouth daily, Historical Med      diclofenac Sodium (VOLTAREN) 1 % Gel topical gel Apply 2 g topically 4 (four) times daily, Starting Wed 11/17/2021, E-Rx      ezetimibe (ZETIA) 10 MG tablet Take 1 tablet (10 mg) by mouth daily, Starting Tue 06/07/2022, E-Rx      Ferrous Sulfate (IRON PO) Take by mouth, Historical Med      gabapentin (NEURONTIN) 100 MG capsule Take 1 capsule (100 mg total) by mouth 2 (two) times daily as needed (Neuropathy), Starting Tue 06/01/2021, Print      levothyroxine (SYNTHROID) 25 MCG tablet Take 1 tablet (25 mcg total) by mouth every morning, Starting Wed 01/29/2020, E-Rx      lisinopril (ZESTRIL) 40 MG tablet Take 1 tablet (40 mg total) by mouth daily, Starting Fri 04/02/2021, E-Rx      meclizine (ANTIVERT) 25 MG tablet Take 1 tablet (25 mg) by mouth 3 (three) times daily as needed for Dizziness, Starting Thu 11/11/2021, E-Rx      Multiple Vitamins-Minerals (MULTIVITAMIN WITH MINERALS) tablet Take 1 tablet by mouth every morning, Historical Med      pantoprazole (PROTONIX) 40 MG tablet Take 1 tablet (40 mg) by mouth daily, Historical Med      rosuvastatin (CRESTOR) 40 MG tablet Take 1 tablet (40 mg) by mouth daily, Starting Fri 05/13/2022, E-Rx               REVIEW OF SYSTEMS   Review of Systems See History of Present Illness  PHYSICAL EXAM     ED Triage Vitals [06/30/22 1037]   Enc Vitals Group      BP 150/68      Heart Rate 60      Resp Rate 22      Temp 98.7 F (37.1 C)      Temp Source Oral      SpO2 97 %      Weight 66.8 kg      Height 1.6 m      Head Circumference  Peak Flow       Pain Score 0      Pain Loc       Pain Edu?       Excl. in GC?      Physical Exam   Nursing note and vitals reviewed.  Constitutional:  Well developed, well nourished.  Awake & alert.    Head:  Atraumatic.  Normocephalic.   No temporal ttp.  Normal temporal artery pulsation  Eyes:  PERRL.  EOMI.  Conjunctivae are not pale. No discharge from eyes b/l.  ENT:  Mucous membranes are moist and intact.  Patent airway. No facial ttp.   Neck:  Supple.  Full ROM.  No meningeal signs.  Cardiovascular:  No JVD  Pulmonary/Chest:  No evidence of respiratory distress.    Extremities:  No edema.   No cyanosis.  No clubbing.    Skin:  Skin is warm and dry.  No diaphoresis. No facial rash.   Neurological:  Alert, awake, and appropriate.  Normal speech.  Sensation normal b/l UE and LE. cranial nerves 2-10 intact. Motor is intact b/l UE and LE.  Normal gate.  Psychiatric:  Good eye contact.  Normal interaction, affect, and behavior    MEDICAL DECISION MAKING     PRIMARY PROBLEM LIST      Acute illness/injury DIAGNOSIS:rectal bleeding  Chronic Illness Impacting Care of the above problem: Hypertension, Cancer, and Advanced age Increases complexity of evaluation, Increases the risk of severe disease, Increase the risk of disease progression, and Increases the risk of poor wound healing  Ddx: collits, gi bleed, hemrroids, constipation anal fissure  DISCUSSION      I consulted dr. Francesco Sor, neurology about case. Ct head in ed unremarkable. Pt symptoms according to dr .,Francesco Sor no change from previous and DO not suspect LVO. He did not recommend admission for Mri at this time. Pt labs unremarkable and CT abd shows moderate stool. D/w GI as H&H show no concerns for severe anemia and pt had very minimal bleeding on rectal exam. Pt was given option to admit for GI or monitor for worsening symptoms and follow up outpt or return to ED if worsened. Pt declined admission. GI referral placed to set up appt asap.     If patient is being hospitalized is severe sepsis or septic shock suspected?: N/A      Was management discussed with a consultant?: Yes (explain)    External Records Reviewed?: Inpatient Records    Additional Notes    Diagnostic test considered and not  performed: N/A    I considered admission however urgent outpatient follow obtained with PCP, Consultant, dispatch health         NIH Stroke Score      Flowsheet Row Most Recent Value   NIH Stroke Scale    Interval Baseline admission to ED   Level of Consciousness (1a. ) 0   Question - age, month 0   Commands; Open and close eyes; Grip and release good hand 0   Gaze 0   Visual Fields 1   Facial Palsy 1   Motor Left Arm:  Arms (palm down) x 10 seconds sitting = 90 degrees supine = 45 degress 1   Motor Right Arm:  Arms (palm down) x 10 seconds sitting = 90 degrees supine = 45 degress 0   Total Motor Arms 1   Motor Left Leg x 5 seconds supine = 30 degrees 1   Motor Right  Leg x 5 seconds supine =  30 degrees 0   Total Motor Legs 1   Limb Ataxia finger to nose 0   Sensory to pin prick 1   Best language describe picture, name items 0   Dysarthria reads words from list 0   Extinction and Inattention 0   NIHSS Total 5            Vital Signs: Reviewed the patient's vital signs.   Nursing Notes: Reviewed and utilized available nursing notes.  Medical Records Reviewed: Reviewed available past medical records.  Counseling: The emergency provider has spoken with the patient and discussed today's findings, in addition to providing specific details for the plan of care.  Questions are answered and there is agreement with the plan.      MIPS DOCUMENTATION              CARDIAC STUDIES    The following cardiac studies were independently interpreted by me the Emergency Medicine Provider.  For full cardiac study results please see chart.    Monitor Strip interpreted by me (ED provider)  Rate: Less than 60  Rhythm: Sinus Bradycardia  ST segments: No acute changes    EKG 1 interpreted by me (ED provider)  Comparison: Yes.  No acute changes.  DATE: 1142   06/30/2022  Rate: Less than 60  Rhythm: Normal Sinus Rhythm w/ 1st degree block  ST segments: Other (explain) PR interval longer than previous  EKG interpretation:  Nonspecific                EMERGENCY IMAGING STUDIES    The following imagine studies were independently interpreted by me (emergency medicine provider):               CT Head Interpreted by me (ED Provider)  Comparison: Yes.  No acute changes.  DATE:  RESULT: No Hemorrhage or Mass Effect  IMPRESSION: No acute abnormality  RADIOLOGY IMAGING STUDIES      CT Abd/ Pelvis without Contrast   Final Result        1. No acute process in the abdomen or pelvis   2. Ancillary findings described above.      Mitali Bapna, MD   06/30/2022 1:28 PM      CT Head WO Contrast   Final Result     No acute intracranial abnormality.          Mitali Bapna, MD   06/30/2022 11:28 AM          EMERGENCY DEPT. MEDICATIONS      ED Medication Orders (From admission, onward)      None            LABORATORY RESULTS    Ordered and independently interpreted AVAILABLE laboratory tests.   Results       Procedure Component Value Units Date/Time    Stool Occult Blood [[id number]]  (Abnormal) Collected: 06/30/22 1337    Specimen: Stool Updated: 06/30/22 1414     Stool Occult Blood Positive    Urinalysis Reflex to Microscopic Exam- Reflex to Culture [[id number]] Collected: 06/30/22 1244     Updated: 06/30/22 1301     Urine Type Urine, Clean Ca     Color, UA Straw     Clarity, UA Clear     Specific Gravity UA 1.006     Urine pH 7.0     Leukocyte Esterase, UA Negative     Nitrite, UA Negative     Protein, UR Negative     Glucose, UA Negative  Ketones UA Negative     Urobilinogen, UA Negative mg/dL      Bilirubin, UA Negative     Blood, UA Negative     RBC, UA 0 - 2 /hpf      WBC, UA 0 - 5 /hpf     High Sensitivity Troponin-I [[id number]] Collected: 06/30/22 1136    Specimen: Blood Updated: 06/30/22 1210     hs Troponin-I <2.7 ng/L     Comprehensive metabolic panel [[id number]]  (Abnormal) Collected: 06/30/22 1136    Specimen: Blood Updated: 06/30/22 1203     Glucose 86 mg/dL      BUN 78.2 mg/dL      Creatinine 1.1 mg/dL      Sodium 956 mEq/L      Potassium 4.6  mEq/L      Chloride 109 mEq/L      CO2 25 mEq/L      Calcium 9.2 mg/dL      Protein, Total 7.1 g/dL      Albumin 4.1 g/dL      AST (SGOT) 32 U/L      ALT 21 U/L      Alkaline Phosphatase 84 U/L      Bilirubin, Total 0.2 mg/dL      Globulin 3.0 g/dL      Albumin/Globulin Ratio 1.4     Anion Gap 10.0     eGFR 51.7 mL/min/1.73 m2     CBC and differential [[id number]]  (Abnormal) Collected: 06/30/22 1136    Specimen: Blood Updated: 06/30/22 1147     WBC 6.55 x10 3/uL      Hgb 10.8 g/dL      Hematocrit 46.9 %      Platelets 298 x10 3/uL      RBC 3.99 x10 6/uL      MCV 87.5 fL      MCH 27.1 pg      MCHC 30.9 g/dL      RDW 14 %      MPV 11.6 fL      Instrument Absolute Neutrophil Count 3.10 x10 3/uL      Neutrophils 47.3 %      Lymphocytes Automated 41.1 %      Monocytes 8.5 %      Eosinophils Automated 2.3 %      Basophils Automated 0.6 %      Immature Granulocytes 0.2 %      Nucleated RBC 0.0 /100 WBC      Neutrophils Absolute 3.10 x10 3/uL      Lymphocytes Absolute Automated 2.69 x10 3/uL      Monocytes Absolute Automated 0.56 x10 3/uL      Eosinophils Absolute Automated 0.15 x10 3/uL      Basophils Absolute Automated 0.04 x10 3/uL      Immature Granulocytes Absolute 0.01 x10 3/uL      Absolute NRBC 0.00 x10 3/uL     Glucose Whole Blood - POCT [[id number]]  (Abnormal) Collected: 06/30/22 1040     Updated: 06/30/22 1044     Whole Blood Glucose POCT 101 mg/dL               CRITICAL CARE/PROCEDURES    Procedures    DIAGNOSIS      Diagnosis:  Final diagnoses:   Constipation, unspecified constipation type   Rectal bleeding   Dizziness       Disposition:  ED Disposition       ED Disposition   Discharge    Condition   --  Date/Time   Thu Jun 30, 2022  2:52 PM    Comment   Emily [name] discharge to home/self care.    Condition at disposition: Stable                 Prescriptions:  Discharge Medication List as of 06/30/2022  2:55 PM        CONTINUE these medications which have NOT CHANGED    Details   amLODIPine (NORVASC)  10 MG tablet Take 1 tablet (10 mg total) by mouth daily, Starting Fri 04/02/2021, E-Rx      anastrozole (ARIMIDEX) 1 MG tablet Take 1 tablet (1 mg total) by mouth every morning, Starting Tue 06/01/2021, Normal      aspirin 81 MG chewable tablet Chew 1 tablet (81 mg total) by mouth daily, Starting Wed 01/29/2020, E-Rx      celecoxib (CeleBREX) 200 MG capsule Take 1 capsule (200 mg) by mouth daily, Historical Med      diclofenac Sodium (VOLTAREN) 1 % Gel topical gel Apply 2 g topically 4 (four) times daily, Starting Wed 11/17/2021, E-Rx      ezetimibe (ZETIA) 10 MG tablet Take 1 tablet (10 mg) by mouth daily, Starting Tue 06/07/2022, E-Rx      Ferrous Sulfate (IRON PO) Take by mouth, Historical Med      gabapentin (NEURONTIN) 100 MG capsule Take 1 capsule (100 mg total) by mouth 2 (two) times daily as needed (Neuropathy), Starting Tue 06/01/2021, Print      levothyroxine (SYNTHROID) 25 MCG tablet Take 1 tablet (25 mcg total) by mouth every morning, Starting Wed 01/29/2020, E-Rx      lisinopril (ZESTRIL) 40 MG tablet Take 1 tablet (40 mg total) by mouth daily, Starting Fri 04/02/2021, E-Rx      meclizine (ANTIVERT) 25 MG tablet Take 1 tablet (25 mg) by mouth 3 (three) times daily as needed for Dizziness, Starting Thu 11/11/2021, E-Rx      Multiple Vitamins-Minerals (MULTIVITAMIN WITH MINERALS) tablet Take 1 tablet by mouth every morning, Historical Med      pantoprazole (PROTONIX) 40 MG tablet Take 1 tablet (40 mg) by mouth daily, Historical Med      rosuvastatin (CRESTOR) 40 MG tablet Take 1 tablet (40 mg) by mouth daily, Starting Fri 05/13/2022, E-Rx                 This note was generated by the Epic EMR system/ Dragon speech recognition and may contain inherent errors or omissions not intended by the user. Grammatical errors, random word insertions, deletions and pronoun errors  are occasional consequences of this technology due to software limitations. Not all errors are caught or corrected. If there are questions or concerns  about the content of this note or information contained within the body of this dictation they should be addressed directly with the author for clarification.           Geralynn Rile, NP  07/03/22 0004

## 2022-06-30 NOTE — Discharge Instructions (Addendum)
1. Return immediately if worse in any way.    2. Follow up with your primary medical doctor for recheck.  Return to the emergency deparment if unable to follow up as instructed for any reason.    3. If you have any issues with follow up please call the [facility] Case Manager at [phone] between 9am and 4pm Monday-Friday for assistance.

## 2022-07-07 ENCOUNTER — Encounter (INDEPENDENT_AMBULATORY_CARE_PROVIDER_SITE_OTHER): Payer: Medicare Other

## 2022-07-22 ENCOUNTER — Ambulatory Visit
Admission: RE | Admit: 2022-07-22 | Discharge: 2022-07-22 | Disposition: A | Discharge status: Home / Self Care | Payer: Medicare Other | Source: Ambulatory Visit | Attending: Hematology & Oncology | Admitting: Hematology & Oncology

## 2022-07-22 DIAGNOSIS — C50919 Malignant neoplasm of unspecified site of unspecified female breast: Secondary | ICD-10-CM | POA: Insufficient documentation

## 2022-07-22 DIAGNOSIS — E538 Deficiency of other specified B group vitamins: Secondary | ICD-10-CM | POA: Insufficient documentation

## 2022-07-22 LAB — CBC AND DIFFERENTIAL
Absolute NRBC: 0 10*3/uL (ref 0.00–0.00)
Basophils Absolute Automated: 0.03 10*3/uL (ref 0.00–0.08)
Basophils Automated: 0.4 %
Eosinophils Absolute Automated: 0.03 10*3/uL (ref 0.00–0.44)
Eosinophils Automated: 0.4 %
Hematocrit: 38.3 % (ref 34.7–43.7)
Hgb: 11.4 g/dL (ref 11.4–14.8)
Immature Granulocytes Absolute: 0.02 10*3/uL (ref 0.00–0.07)
Immature Granulocytes: 0.3 %
Instrument Absolute Neutrophil Count: 5.1 10*3/uL (ref 1.10–6.33)
Lymphocytes Absolute Automated: 1.69 10*3/uL (ref 0.42–3.22)
Lymphocytes Automated: 23.1 %
MCH: 26.2 pg (ref 25.1–33.5)
MCHC: 29.8 g/dL — ABNORMAL LOW (ref 31.5–35.8)
MCV: 88 fL (ref 78.0–96.0)
MPV: 12.8 fL — ABNORMAL HIGH (ref 8.9–12.5)
Monocytes Absolute Automated: 0.46 10*3/uL (ref 0.21–0.85)
Monocytes: 6.3 %
Neutrophils Absolute: 5.1 10*3/uL (ref 1.10–6.33)
Neutrophils: 69.5 %
Nucleated RBC: 0 /100 WBC (ref 0.0–0.0)
Platelets: 263 10*3/uL (ref 142–346)
RBC: 4.35 10*6/uL (ref 3.90–5.10)
RDW: 14 % (ref 11–15)
WBC: 7.33 10*3/uL (ref 3.10–9.50)

## 2022-07-22 LAB — COMPREHENSIVE METABOLIC PANEL
ALT: 14 U/L (ref 0–55)
AST (SGOT): 25 U/L (ref 5–41)
Albumin/Globulin Ratio: 1.5 (ref 0.9–2.2)
Albumin: 4.2 g/dL (ref 3.5–5.0)
Alkaline Phosphatase: 76 U/L (ref 37–117)
Anion Gap: 10 (ref 5.0–15.0)
BUN: 12 mg/dL (ref 7.0–21.0)
Bilirubin, Total: 0.3 mg/dL (ref 0.2–1.2)
CO2: 27 mEq/L (ref 17–29)
Calcium: 9.4 mg/dL (ref 7.9–10.2)
Chloride: 104 mEq/L (ref 99–111)
Creatinine: 1.1 mg/dL — ABNORMAL HIGH (ref 0.4–1.0)
Globulin: 2.8 g/dL (ref 2.0–3.6)
Glucose: 107 mg/dL — ABNORMAL HIGH (ref 70–100)
Potassium: 4.1 mEq/L (ref 3.5–5.3)
Protein, Total: 7 g/dL (ref 6.0–8.3)
Sodium: 141 mEq/L (ref 135–145)
eGFR: 51.7 mL/min/{1.73_m2} — AB (ref >=60)

## 2022-07-22 LAB — TRANSFERRIN: Transferrin: 256 mg/dL (ref 174–382)

## 2022-07-22 LAB — FOLATE: Folate: 34.4 ng/mL

## 2022-07-22 LAB — FERRITIN: Ferritin: 20.5 ng/mL (ref 4.60–204.00)

## 2022-07-22 LAB — HEMOLYSIS INDEX: Hemolysis Index: 8 Index (ref 0–24)

## 2022-07-22 LAB — VITAMIN B12: Vitamin B-12: 1521 pg/mL — ABNORMAL HIGH (ref 211–911)

## 2022-07-24 LAB — CA 27, 29: CA 27.29: <10 U/mL (ref <38)

## 2022-07-24 LAB — IRON PROFILE
Iron Saturation: 26 % (calc) (ref 16–45)
Iron: 80 ug/dL (ref 45–160)
TIBC: 312 mcg/dL (calc) (ref 250–450)

## 2022-08-02 ENCOUNTER — Other Ambulatory Visit: Payer: Self-pay | Admitting: Hematology & Oncology

## 2022-08-02 DIAGNOSIS — C50011 Malignant neoplasm of nipple and areola, right female breast: Secondary | ICD-10-CM

## 2022-08-31 ENCOUNTER — Ambulatory Visit (INDEPENDENT_AMBULATORY_CARE_PROVIDER_SITE_OTHER): Payer: Medicare Other | Admitting: Gastroenterology

## 2022-09-16 ENCOUNTER — Observation Stay
Admission: EM | Admit: 2022-09-16 | Discharge: 2022-09-17 | Disposition: A | Discharge status: Home / Self Care | Payer: Medicare Other | Attending: Student in an Organized Health Care Education/Training Program | Admitting: Student in an Organized Health Care Education/Training Program

## 2022-09-16 ENCOUNTER — Observation Stay: Payer: Medicare Other

## 2022-09-16 ENCOUNTER — Emergency Department: Payer: Medicare Other

## 2022-09-16 DIAGNOSIS — I10 Essential (primary) hypertension: Secondary | ICD-10-CM | POA: Diagnosis present

## 2022-09-16 DIAGNOSIS — E663 Overweight: Secondary | ICD-10-CM | POA: Insufficient documentation

## 2022-09-16 DIAGNOSIS — R42 Dizziness and giddiness: Secondary | ICD-10-CM | POA: Diagnosis present

## 2022-09-16 DIAGNOSIS — Z853 Personal history of malignant neoplasm of breast: Secondary | ICD-10-CM | POA: Insufficient documentation

## 2022-09-16 DIAGNOSIS — C50811 Malignant neoplasm of overlapping sites of right female breast: Secondary | ICD-10-CM | POA: Diagnosis present

## 2022-09-16 DIAGNOSIS — R079 Chest pain, unspecified: Secondary | ICD-10-CM | POA: Insufficient documentation

## 2022-09-16 DIAGNOSIS — R531 Weakness: Secondary | ICD-10-CM | POA: Diagnosis present

## 2022-09-16 DIAGNOSIS — K219 Gastro-esophageal reflux disease without esophagitis: Secondary | ICD-10-CM | POA: Insufficient documentation

## 2022-09-16 DIAGNOSIS — E039 Hypothyroidism, unspecified: Secondary | ICD-10-CM | POA: Diagnosis present

## 2022-09-16 DIAGNOSIS — Q2549 Other congenital malformations of aorta: Secondary | ICD-10-CM | POA: Insufficient documentation

## 2022-09-16 DIAGNOSIS — Z6826 Body mass index (BMI) 26.0-26.9, adult: Secondary | ICD-10-CM | POA: Insufficient documentation

## 2022-09-16 DIAGNOSIS — R200 Anesthesia of skin: Secondary | ICD-10-CM | POA: Diagnosis present

## 2022-09-16 DIAGNOSIS — I6782 Cerebral ischemia: Secondary | ICD-10-CM | POA: Insufficient documentation

## 2022-09-16 DIAGNOSIS — E782 Mixed hyperlipidemia: Secondary | ICD-10-CM | POA: Insufficient documentation

## 2022-09-16 DIAGNOSIS — R410 Disorientation, unspecified: Secondary | ICD-10-CM | POA: Insufficient documentation

## 2022-09-16 LAB — ECG 12-LEAD
P Axis: 54 degrees
P-R Interval: 184 ms
Q-T Interval: 434 ms

## 2022-09-16 LAB — CBC AND DIFFERENTIAL
Absolute NRBC: 0 10*3/uL (ref 0.00–0.00)
Basophils Absolute Automated: 0.03 10*3/uL (ref 0.00–0.08)
Basophils Automated: 0.5 %
Eosinophils Absolute Automated: 0.14 10*3/uL (ref 0.00–0.44)
Eosinophils Automated: 2.2 %
Hematocrit: 34.8 % (ref 34.7–43.7)
Hgb: 10.6 g/dL — ABNORMAL LOW (ref 11.4–14.8)
Immature Granulocytes Absolute: 0.01 10*3/uL (ref 0.00–0.07)
Immature Granulocytes: 0.2 %
Instrument Absolute Neutrophil Count: 3.14 10*3/uL (ref 1.10–6.33)
Lymphocytes Absolute Automated: 2.49 10*3/uL (ref 0.42–3.22)
Lymphocytes Automated: 39.5 %
MCH: 27.4 pg (ref 25.1–33.5)
MCHC: 30.5 g/dL — ABNORMAL LOW (ref 31.5–35.8)
MCV: 89.9 fL (ref 78.0–96.0)
MPV: 11.4 fL (ref 8.9–12.5)
Monocytes Absolute Automated: 0.49 10*3/uL (ref 0.21–0.85)
Monocytes: 7.8 %
Neutrophils Absolute: 3.14 10*3/uL (ref 1.10–6.33)
Neutrophils: 49.8 %
Nucleated RBC: 0 /100 WBC (ref 0.0–0.0)
Platelets: 313 10*3/uL (ref 142–346)
RBC: 3.87 10*6/uL — ABNORMAL LOW (ref 3.90–5.10)
RDW: 14 % (ref 11–15)
WBC: 6.3 10*3/uL (ref 3.10–9.50)

## 2022-09-16 LAB — COMPREHENSIVE METABOLIC PANEL
ALT: 11 U/L (ref 0–55)
AST (SGOT): 20 U/L (ref 5–41)
Albumin/Globulin Ratio: 1.3 (ref 0.9–2.2)
Albumin: 3.9 g/dL (ref 3.5–5.0)
Alkaline Phosphatase: 79 U/L (ref 37–117)
Anion Gap: 7 (ref 5.0–15.0)
BUN: 16 mg/dL (ref 7.0–21.0)
Bilirubin, Total: 0.2 mg/dL (ref 0.2–1.2)
CO2: 30 mEq/L — ABNORMAL HIGH (ref 17–29)
Calcium: 9.2 mg/dL (ref 7.9–10.2)
Chloride: 108 mEq/L (ref 99–111)
Creatinine: 1.1 mg/dL — ABNORMAL HIGH (ref 0.4–1.0)
Globulin: 2.9 g/dL (ref 2.0–3.6)
Glucose: 92 mg/dL (ref 70–100)
Potassium: 4 mEq/L (ref 3.5–5.3)
Protein, Total: 6.8 g/dL (ref 6.0–8.3)
Sodium: 145 mEq/L (ref 135–145)
eGFR: 51.7 mL/min/{1.73_m2} — AB (ref >=60)

## 2022-09-16 LAB — HIGH SENSITIVITY TROPONIN-I WITH DELTA
hs Troponin-I Delta: UNDETERMINED ng/L
hs Troponin-I: <2.7 ng/L

## 2022-09-16 LAB — APTT: PTT: 35 s (ref 27–39)

## 2022-09-16 LAB — PT/INR
PT INR: 0.9 (ref 0.9–1.1)
PT: 11 s (ref 10.1–12.9)

## 2022-09-16 LAB — HIGH SENSITIVITY TROPONIN-I: hs Troponin-I: <2.7 ng/L

## 2022-09-16 LAB — GLUCOSE WHOLE BLOOD - POCT: Whole Blood Glucose POCT: 105 mg/dL — ABNORMAL HIGH (ref 70–100)

## 2022-09-16 MED ORDER — KETOROLAC TROMETHAMINE 30 MG/ML IJ SOLN
15.0000 mg | Freq: Once | INTRAMUSCULAR | Status: AC
Start: 2022-09-16 — End: 2022-09-16
  Administered 2022-09-16: 15 mg via INTRAVENOUS
  Filled 2022-09-16: qty 1

## 2022-09-16 MED ORDER — METOCLOPRAMIDE HCL 5 MG/ML IJ SOLN
5.0000 mg | Freq: Once | INTRAMUSCULAR | Status: AC
Start: 2022-09-16 — End: 2022-09-16
  Administered 2022-09-16: 5 mg via INTRAVENOUS
  Filled 2022-09-16: qty 2

## 2022-09-16 MED ORDER — DIPHENHYDRAMINE HCL 50 MG/ML IJ SOLN
25.0000 mg | Freq: Once | INTRAMUSCULAR | Status: AC
Start: 2022-09-16 — End: 2022-09-16
  Administered 2022-09-16: 25 mg via INTRAVENOUS
  Filled 2022-09-16: qty 1

## 2022-09-16 MED ORDER — ASPIRIN 325 MG PO TABS
325.0000 mg | ORAL_TABLET | Freq: Every day | ORAL | Status: DC
Start: 2022-09-17 — End: 2022-09-17

## 2022-09-16 NOTE — ED Notes (Signed)
Bed: E06  Expected date:   Expected time:   Means of arrival:   Comments:  Medic 409 Diarrhea, H/A, CP, Dizziness, Abdl Pain  ETA 

## 2022-09-16 NOTE — ED Notes (Signed)
MD at bedside to evaluate patient.

## 2022-09-16 NOTE — ED Triage Notes (Signed)
pt BIBA from home with multiple complaints. c/o of weakness and numbness on the right arm that started this afternoon at 2pm. she also states she has numbness on her left leg and blurred vision. she also has abd pain and nausea. hx of htn and breast cancer. dexi 105.

## 2022-09-16 NOTE — ED Provider Notes (Signed)
EMERGENCY DEPARTMENT NOTE     Patient initially seen and examined at   ED PHYSICIAN ASSIGNED       Date/Time Event User Comments    09/16/22 2016 Physician Assigned [place], Shela Nevin, Veverly Fells, MD assigned as Attending    09/16/22 2339 Physician Assigned Kris Hartmann, MD assigned as Attending           ED MIDLEVEL (APP) ASSIGNED       None            HISTORY OF PRESENT ILLNESS       Chief Complaint: Numbness and Generalized weakness       76 y.o. female with past medical history as below presents emergency department after developing left arm and leg numbness 6 hours prior to arrival.  No gross weakness.  Patient did have some lightheadedness with standing but no vertigo.  Patient denies vision changes to me.  No speech difficulty.  Patient did note some palpitations but no gross chest pain or shortness of breath.  No lower extremity swelling or pain.  No neck or back pain.    Independent Historian (other than patient): No  Additional History Provided by Independent Historian:  MEDICAL HISTORY     Past Medical History:  Past Medical History:   Diagnosis Date    Breast cancer 2014    right breast mastectomy    Exercise-induced angina     negative work up    GERD (gastroesophageal reflux disease)     Headache     resolved    Hyperlipidemia     Hypertension     Hypothyroidism     Low back pain     Malignant neoplasm of overlapping sites of right female breast 10/19/2015    Syncope and collapse     Chronic Vertigo    Vertigo        Past Surgical History:  Past Surgical History:   Procedure Laterality Date    COLONOSCOPY  2021    COLONOSCOPY, POLYPECTOMY N/A 07/21/2020    Procedure: COLONOSCOPY, POLYPECTOMY;  Surgeon: Einar Grad, MD;  Location: [facility];  Service: Gastroenterology;  Laterality: N/A;    EGD, BIOPSY N/A 07/21/2020    Procedure: EGD, BIOPSY;  Surgeon: Einar Grad, MD;  Location: [facility];  Service: Gastroenterology;  Laterality: N/A;    EXPLORATORY LAPAROTOMY N/A  05/22/2021    Procedure: EXPLORATORY LAPAROTOMY;  Surgeon: Delena Serve, MD;  Location: [facility];  Service: General;  Laterality: N/A;    HYSTERECTOMY      LAPAROSCOPY, DIAGNOSTIC N/A 05/22/2021    Procedure: LAPAROSCOPY, DIAGNOSTIC;  Surgeon: Delena Serve, MD;  Location: [facility];  Service: General;  Laterality: N/A;    LAPAROTOMY, COLECTOMY, RIGHT Right 05/22/2021    Procedure: LAPAROTOMY, COLECTOMY, RIGHT;  Surgeon: Delena Serve, MD;  Location: [facility];  Service: General;  Laterality: Right;    MASTECTOMY Right 2015       Social History:  Social History     Socioeconomic History    Marital status: Widowed   Tobacco Use    Smoking status: Never    Smokeless tobacco: Never   Vaping Use    Vaping Use: Never used   Substance and Sexual Activity    Alcohol use: Yes     Comment: occ    Drug use: No    Sexual activity: Not Currently     Social Determinants of Health  Financial Resource Strain: Low Risk  (05/12/2022)    Overall Financial Resource Strain (CARDIA)     Difficulty of Paying Living Expenses: Not hard at all   Food Insecurity: No Food Insecurity (09/16/2022)    Hunger Vital Sign     Worried About Running Out of Food in the Last Year: Never true     Ran Out of Food in the Last Year: Never true   Transportation Needs: No Transportation Needs (05/12/2022)    PRAPARE - Therapist, art (Medical): No     Lack of Transportation (Non-Medical): No   Intimate Partner Violence: Not At Risk (09/16/2022)    Humiliation, Afraid, Rape, and Kick questionnaire     Fear of Current or Ex-Partner: No     Emotionally Abused: No     Physically Abused: No     Sexually Abused: No   Housing Stability: Unknown (05/12/2022)    Housing Stability Vital Sign     Unable to Pay for Housing in the Last Year: No     Unstable Housing in the Last Year: No       Family History:  Family History   Problem Relation Age of Onset    Breast cancer Neg Hx        Outpatient  Medication:  Previous Medications    AMLODIPINE (NORVASC) 10 MG TABLET    Take 1 tablet (10 mg total) by mouth daily    ANASTROZOLE (ARIMIDEX) 1 MG TABLET    Take 1 tablet (1 mg total) by mouth every morning    ASPIRIN 81 MG CHEWABLE TABLET    Chew 1 tablet (81 mg total) by mouth daily    CELECOXIB (CELEBREX) 200 MG CAPSULE    Take 1 capsule (200 mg) by mouth daily    DICLOFENAC SODIUM (VOLTAREN) 1 % GEL TOPICAL GEL    Apply 2 g topically 4 (four) times daily    EZETIMIBE (ZETIA) 10 MG TABLET    Take 1 tablet (10 mg) by mouth daily    FERROUS SULFATE (IRON PO)    Take by mouth    GABAPENTIN (NEURONTIN) 100 MG CAPSULE    Take 1 capsule (100 mg total) by mouth 2 (two) times daily as needed (Neuropathy)    LEVOTHYROXINE (SYNTHROID) 25 MCG TABLET    Take 1 tablet (25 mcg total) by mouth every morning    LISINOPRIL (ZESTRIL) 40 MG TABLET    Take 1 tablet (40 mg total) by mouth daily    MECLIZINE (ANTIVERT) 25 MG TABLET    Take 1 tablet (25 mg) by mouth 3 (three) times daily as needed for Dizziness    MULTIPLE VITAMINS-MINERALS (MULTIVITAMIN WITH MINERALS) TABLET    Take 1 tablet by mouth every morning    PANTOPRAZOLE (PROTONIX) 40 MG TABLET    Take 1 tablet (40 mg) by mouth daily    ROSUVASTATIN (CRESTOR) 40 MG TABLET    Take 1 tablet (40 mg) by mouth daily         REVIEW OF SYSTEMS   Review of Systems See History of Present Illness  PHYSICAL EXAM     ED Triage Vitals [09/16/22 2014]   Enc Vitals Group      BP (!) 210/86      Heart Rate (!) 59      Resp Rate 20      Temp 98.2 F (36.8 C)      Temp src       SpO2  98 %      Weight 69.1 kg      Height 1.626 m      Head Circumference       Peak Flow       Pain Score 5      Pain Loc       Pain Edu?       Excl. in GC?      Physical Exam   Nursing note and vitals reviewed.  Constitutional:  Well developed, well nourished.  Awake & alert.    Head:  Atraumatic.  Normocephalic.    Eyes:  PERRL.  EOMI.  Conjunctivae are not pale.  ENT:  Mucous membranes are moist and intact.   Oropharynx is clear and symmetric.  Patent airway.  Neck:  Supple.  Full ROM.  No JVD.    Cardiovascular:  Regular rate.  Regular rhythm.   Pulmonary/Chest:  No evidence of respiratory distress.  Clear to auscultation bilaterally.  No wheezing, rales or rhonchi.   Abdominal:  Soft and non-distended.  There is no tenderness.  No rebound, guarding, or rigidity.    Back:  No swelling or deformity  Extremities:  No edema.   No cyanosis.  No clubbing.  Full range of motion in all extremities.  No calf tenderness.  Skin:  Skin is warm and dry.  No diaphoresis.  Neurological: Motor intact bilateral upper and lower extremities.  Cranial nerves II through XII are intact.  Normal speech.  Sensation intact bilateral upper and lower extremities.  Psychiatric:  Good eye contact.  Normal interaction, affect, and behavior    MEDICAL DECISION MAKING     PRIMARY PROBLEM LIST      Acute illness/injury with risk to life or bodily function (based on differential diagnosis or evaluation) DIAGNOSIS: Left-sided numbness         DISCUSSION    CT head rule out ICH or CVA negative for acute process.  TNK considered but given last known normal 6 hours prior arrival not indicated.  Blood pressure poorly controlled in the ED will allow for permissive hypertension now during CVA rule out.  Will admit for further evaluation.  Dr. Carmela Hurt accepts to his service for further care.    If patient is being hospitalized is severe sepsis or septic shock suspected?: No infection is suspected and no antibiotics were given          External Records Reviewed?: Inpatient Records inpatient admission May 12, 2022 admitted for dizziness    Additional Notes                       Vital Signs: Reviewed the patient's vital signs.   Nursing Notes: Reviewed and utilized available nursing notes.  Medical Records Reviewed: Reviewed available past medical records.  Counseling: The emergency provider has spoken with the patient and discussed today's findings, in  addition to providing specific details for the plan of care.  Questions are answered and there is agreement with the plan.      MIPS DOCUMENTATION              CARDIAC STUDIES    The following cardiac studies were independently interpreted by me the Emergency Medicine Provider.  For full cardiac study results please see chart.    Monitor Strip interpreted by me (ED provider)  Rate: Less than 60  Rhythm: Sinus Bradycardia  ST segments: No acute changes    EKG 1 interpreted by me (ED provider)  Comparison:  Yes.  No acute changes.  DATE:06/30/22  Time Interpreted:2028  Rate: Less than 60  Rhythm: Sinus Bradycardia  ST segments: No acute changes  STEMI?: NO  EKG interpretation: Nonspecific                    EMERGENCY IMAGING STUDIES    The following imagine studies were independently interpreted by me (emergency medicine provider):               CT Head Interpreted by me (ED Provider)  Comparison: Yes.  No acute changes.  DATE:06/30/22  RESULT: No Hemorrhage or Mass Effect  IMPRESSION: No acute abnormality  RADIOLOGY IMAGING STUDIES      CT Head WO Contrast   Final Result         No acute abnormality detected.      Carleene Overlie, MD   09/16/2022 10:09 PM      Chest AP Portable   Final Result    No acute cardiopulmonary abnormality.      Gustavus Messing, MD   09/16/2022 9:26 PM      MRI Brain W WO Contrast    (Results Pending)   MR Angiogram Head WO Contrast    (Results Pending)   MR Angiogram Neck W WO Contrast    (Results Pending)       EMERGENCY DEPT. MEDICATIONS      ED Medication Orders (From admission, onward)      Start Ordered     Status Ordering Provider    09/17/22 0900 09/16/22 2340  aspirin tablet 325 mg  Daily        Route: Oral  Ordered Dose: 325 mg       Ordered Kemia Wendel D    09/16/22 2218 09/16/22 2217  ketorolac (TORADOL) injection 15 mg  Once        Route: Intravenous  Ordered Dose: 15 mg       Last MAR action: Given Dustie Brittle D    09/16/22 2218 09/16/22 2217  metoclopramide (REGLAN) injection  5 mg  Once        Route: Intravenous  Ordered Dose: 5 mg       Last MAR action: Given Nizhoni Parlow D    09/16/22 2218 09/16/22 2217  diphenhydrAMINE (BENADRYL) injection 25 mg  Once        Route: Intravenous  Ordered Dose: 25 mg       Last MAR action: Given Damaris Abeln D            LABORATORY RESULTS    Ordered and independently interpreted AVAILABLE laboratory tests.   Results       Procedure Component Value Units Date/Time    Lipid panel [[id number]] Collected: 09/16/22 2043    Specimen: Blood Updated: 09/16/22 2345    Narrative:      Fasting specimen    High Sensitivity Troponin-I at 2 hrs with calculated Delta [[id number]] Collected: 09/16/22 2259    Specimen: Blood Updated: 09/16/22 2328     hs Troponin-I <2.7 ng/L      hs Troponin-I Delta Unable toCalc. ng/L     High Sensitivity Troponin-I [[id number]] Collected: 09/16/22 2043    Specimen: Blood Updated: 09/16/22 2124     hs Troponin-I <2.7 ng/L     Comprehensive metabolic panel [[id number]]  (Abnormal) Collected: 09/16/22 2043    Specimen: Blood Updated: 09/16/22 2121     Glucose 92 mg/dL      BUN 57.3 mg/dL  Creatinine 1.1 mg/dL      Sodium 161 mEq/L      Potassium 4.0 mEq/L      Chloride 108 mEq/L      CO2 30 mEq/L      Calcium 9.2 mg/dL      Protein, Total 6.8 g/dL      Albumin 3.9 g/dL      AST (SGOT) 20 U/L      ALT 11 U/L      Alkaline Phosphatase 79 U/L      Bilirubin, Total 0.2 mg/dL      Globulin 2.9 g/dL      Albumin/Globulin Ratio 1.3     Anion Gap 7.0     eGFR 51.7 mL/min/1.73 m2     Prothrombin time/INR [[id number]] Collected: 09/16/22 2048    Specimen: Blood Updated: 09/16/22 2108     PT 11.0 sec      PT INR 0.9    APTT [[id number]] Collected: 09/16/22 2048     Updated: 09/16/22 2108     PTT 35 sec     CBC and differential [[id number]]  (Abnormal) Collected: 09/16/22 2043    Specimen: Blood Updated: 09/16/22 2100     WBC 6.30 x10 3/uL      Hgb 10.6 g/dL      Hematocrit 57.8 %      Platelets 313 x10 3/uL      RBC 3.87 x10 6/uL      MCV 89.9  fL      MCH 27.4 pg      MCHC 30.5 g/dL      RDW 14 %      MPV 11.4 fL      Instrument Absolute Neutrophil Count 3.14 x10 3/uL      Neutrophils 49.8 %      Lymphocytes Automated 39.5 %      Monocytes 7.8 %      Eosinophils Automated 2.2 %      Basophils Automated 0.5 %      Immature Granulocytes 0.2 %      Nucleated RBC 0.0 /100 WBC      Neutrophils Absolute 3.14 x10 3/uL      Lymphocytes Absolute Automated 2.49 x10 3/uL      Monocytes Absolute Automated 0.49 x10 3/uL      Eosinophils Absolute Automated 0.14 x10 3/uL      Basophils Absolute Automated 0.03 x10 3/uL      Immature Granulocytes Absolute 0.01 x10 3/uL      Absolute NRBC 0.00 x10 3/uL     Glucose Whole Blood - POCT [[id number]]  (Abnormal) Collected: 09/16/22 2021     Updated: 09/16/22 2031     Whole Blood Glucose POCT 105 mg/dL               CRITICAL CARE/PROCEDURES    Procedures    DIAGNOSIS      Diagnosis:  Final diagnoses:   Left sided numbness       Disposition:  ED Disposition       ED Disposition   Expedited Observation    Condition   --    Date/Time   Fri Sep 16, 2022 11:39 PM    Comment   Admitting Physician: Henderson Newcomer [[id number]]   Estimated Length of Stay: < 2 midnights   Tentative Discharge Plan?: Home or Self Care [1]                 Prescriptions:  Patient's Medications   New Prescriptions  No medications on file   Previous Medications    AMLODIPINE (NORVASC) 10 MG TABLET    Take 1 tablet (10 mg total) by mouth daily    ANASTROZOLE (ARIMIDEX) 1 MG TABLET    Take 1 tablet (1 mg total) by mouth every morning    ASPIRIN 81 MG CHEWABLE TABLET    Chew 1 tablet (81 mg total) by mouth daily    CELECOXIB (CELEBREX) 200 MG CAPSULE    Take 1 capsule (200 mg) by mouth daily    DICLOFENAC SODIUM (VOLTAREN) 1 % GEL TOPICAL GEL    Apply 2 g topically 4 (four) times daily    EZETIMIBE (ZETIA) 10 MG TABLET    Take 1 tablet (10 mg) by mouth daily    FERROUS SULFATE (IRON PO)    Take by mouth    GABAPENTIN (NEURONTIN) 100 MG CAPSULE    Take 1 capsule (100  mg total) by mouth 2 (two) times daily as needed (Neuropathy)    LEVOTHYROXINE (SYNTHROID) 25 MCG TABLET    Take 1 tablet (25 mcg total) by mouth every morning    LISINOPRIL (ZESTRIL) 40 MG TABLET    Take 1 tablet (40 mg total) by mouth daily    MECLIZINE (ANTIVERT) 25 MG TABLET    Take 1 tablet (25 mg) by mouth 3 (three) times daily as needed for Dizziness    MULTIPLE VITAMINS-MINERALS (MULTIVITAMIN WITH MINERALS) TABLET    Take 1 tablet by mouth every morning    PANTOPRAZOLE (PROTONIX) 40 MG TABLET    Take 1 tablet (40 mg) by mouth daily    ROSUVASTATIN (CRESTOR) 40 MG TABLET    Take 1 tablet (40 mg) by mouth daily   Modified Medications    No medications on file   Discontinued Medications    No medications on file           This note was generated by the Epic EMR system/ Dragon speech recognition and may contain inherent errors or omissions not intended by the user. Grammatical errors, random word insertions, deletions and pronoun errors  are occasional consequences of this technology due to software limitations. Not all errors are caught or corrected. If there are questions or concerns about the content of this note or information contained within the body of this dictation they should be addressed directly with the author for clarification.           Shela Nevin, MD  09/16/22 [phone]

## 2022-09-16 NOTE — ED to IP RN Note (Signed)
[facility]  ED NURSING NOTE FOR THE RECEIVING INPATIENT NURSE   ED NURSE Romanda Turrubiates   [place] [phone]   ED CHARGE RN Charisse Klinefelter   ADMISSION INFORMATION   Emily [name] is a 76 y.o. female admitted with an ED diagnosis of:    1. Left sided numbness         Isolation: None   Allergies: Morphine and Morphine and related   Holding Orders confirmed? Yes   Belongings Documented? Yes   Home medications sent to pharmacy confirmed? No   NURSING CARE   Patient Comes From:   Mental Status: Home Independent  alert and oriented   ADL: Independent with all ADLs   Ambulation: mild difficulty   Pertinent Information  and Safety Concerns:     Broset Violence Risk Level: Emily [name] Elevated BP. Dr. Nonda Lou aware of elevated BP and gave orders for pain medication.      CT / NIH   CT Head ordered on this patient?  Yes   NIH/Dysphagia assessment done prior to admission? Yes   VITAL SIGNS (at the time of this note)      Vitals:    09/16/22 2354   BP: (!) 192/94   Pulse: (!) 49   Resp: 20   Temp: 97.9 F (36.6 C)   SpO2: 97%

## 2022-09-16 NOTE — EDIE (Signed)
[facility]?NOTIFICATION?09/16/2022 20:11?Emily [name], Emily [name]?MRN: [id number]    [facility]'s patient encounter information:   VWU:?[id number]  Account [id number]  Billing Account [id number]      Criteria Met      5 ED Visits in 12 Months    Security and Safety  No Security Events were found.  ED Care Guidelines  There are currently no ED Care Guidelines for this patient. Please check your facility's medical records system.        Prescription Monitoring Program  000 ??- Narcotic Use Score   000 ??- Sedative Use Score   000 ??- Stimulant Use Score   000??- Overdose Risk Score  - All Scores range from 000-999 with 75% of the population scoring < 200 and on 1% scoring above 650  - The last digit of the narcotic, sedative, and stimulant score indicates the number of active prescriptions of that type  - Higher Use scores correlate with increased prescribers, pharmacies, mg equiv, and overlapping prescriptions   - Higher Overdose Risk Scores correlate with increased risk of unintentional overdose death   Concerning or unexpectedly high scores should prompt a review of the PMP record; this does not constitute checking PMP for prescribing purposes.    E.D. Visit Count (12 mo.)  Facility Visits   [facility] 9   Total 9   Note: Visits indicate total known visits.     Recent Emergency Department Visit Summary  Date Facility [facility] Type Diagnoses or Chief Complaint    Sep 16, 2022  [place] [facility]  [place]  [place]  Emergency      medic      Jun 30, 2022  [place] [facility]  [place]  [place]  Emergency      Dizziness and giddiness      Constipation, unspecified      Hemorrhage of anus and rectum      Rectal Bleeding      Hematuria      Headache      Abdominal Pain      Dizziness      MEDIC #[id number]      May 12, 2022  [place] [facility]  [place]  [place]  Emergency      Extremity Weakness      Dizziness      Dizziness, weakness, left side uneasy      Feb 15, 2022  [place] [facility]  [place]  [place]  Emergency      Chest pain, unspecified      Generalized abdominal pain      Weakness      Generalized Weakness      Jan 14, 2022  [place] [facility]  [place]  [place]  Emergency      Unspecified symptoms and signs involving the nervous system      Dizziness and giddiness      Unspecified visual loss      Cerebrovascular Accident      L side numbness      Nov 30, 2021  [place] [facility]  [place]  [place]  Emergency      Pulmonary fibrosis, unspecified      Dizziness and giddiness      Fatigue      Weakness; Fatigue      Nov 17, 2021   [facility]  [place]  [place]  Emergency      Calculus  of gallbladder without cholecystitis without obstruction      Unspecified abdominal pain      Dysuria      Abdominal Pain      Nausea      Scar Tissue Pain      Pain in right side, breast cancer patient      Nov 11, 2021  [place] [facility]  [place]  [place]  Emergency      Nontoxic single thyroid nodule      Paresthesia of skin      Chest pain, unspecified      Acute kidney failure, unspecified      Syncope and collapse      Dizziness and giddiness      Syncope      Chest Pain      chest pain,dizziness,weakness      Oct 15, 2021  [place] [facility]  [place]  [place]  Emergency      Other chest pain      Palpitations      palpitations shortness of breath        Recent Inpatient Visit Summary  Date Facility [facility] Type Diagnoses or Chief Complaint    Jan 14, 2022  [place] [facility]  [place]    [facility]      Dizziness and giddiness      Unspecified symptoms and signs involving the nervous system      Unspecified visual loss        Care Team  Provider Specialty Phone Fax Service Dates   [place], [facility] , DO Internal Medicine   Current    Rondel Baton, M.D. Internal Medicine   Current    DAVIDSON, Janet Berlin MD Judie Petit, MD Family Medicine: Adult Medicine [phone] Current    Everlean Patterson, M.D. Internal Medicine   Current    Santa Lighter, M.D. Internal Medicine   Current     Cornelius Moras, NP Nurse Practitioner: Adult Health   Current    Sandria Bales Nurse Practitioner: Gerontology [phone]  Current      [facility]  This patient has registered at the [facility] Emergency Department  For more information visit: [email or web address]     PLEASE NOTE:     1.   Any care recommendations and other clinical information are provided as guidelines or for historical purposes only, and providers should exercise their own clinical judgment when providing care.    2.   You may only use this information for purposes of treatment, payment or health care operations activities, and subject to the limitations of applicable [facility] Policies.    3.   You should consult directly with the organization that provided a care guideline or other clinical history with any questions about additional information or accuracy or completeness of information provided.    ? 2023 [employer] - [email or web address]

## 2022-09-17 ENCOUNTER — Observation Stay: Payer: Medicare Other

## 2022-09-17 ENCOUNTER — Encounter: Payer: Self-pay | Admitting: Internal Medicine

## 2022-09-17 DIAGNOSIS — R200 Anesthesia of skin: Secondary | ICD-10-CM

## 2022-09-17 LAB — BASIC METABOLIC PANEL
Anion Gap: 9 (ref 5.0–15.0)
BUN: 11 mg/dL (ref 7.0–21.0)
CO2: 30 mEq/L — ABNORMAL HIGH (ref 17–29)
Calcium: 10.1 mg/dL (ref 7.9–10.2)
Chloride: 106 mEq/L (ref 99–111)
Creatinine: 0.9 mg/dL (ref 0.4–1.0)
Glucose: 110 mg/dL — ABNORMAL HIGH (ref 70–100)
Potassium: 4.3 mEq/L (ref 3.5–5.3)
Sodium: 145 mEq/L (ref 135–145)
eGFR: >60 mL/min/{1.73_m2} (ref >=60)

## 2022-09-17 LAB — URINALYSIS REFLEX TO MICROSCOPIC EXAM - REFLEX TO CULTURE
Bilirubin, UA: NEGATIVE
Blood, UA: NEGATIVE
Glucose, UA: NEGATIVE
Ketones UA: NEGATIVE
Leukocyte Esterase, UA: NEGATIVE
Nitrite, UA: NEGATIVE
Protein, UR: NEGATIVE
Specific Gravity UA: 1.005 (ref 1.001–1.035)
Urine pH: 8 (ref 5.0–8.0)
Urobilinogen, UA: NEGATIVE mg/dL (ref 0.2–2.0)

## 2022-09-17 LAB — HEMOGLOBIN A1C
Average Estimated Glucose: 116.9 mg/dL
Average Estimated Glucose: 111.2 mg/dL
Hemoglobin A1C: 5.7 % — ABNORMAL HIGH (ref 4.6–5.6)
Hemoglobin A1C: 5.5 % (ref 4.6–5.6)

## 2022-09-17 LAB — PHOSPHORUS: Phosphorus: 3.4 mg/dL (ref 2.3–4.7)

## 2022-09-17 LAB — CBC AND DIFFERENTIAL
Absolute NRBC: 0 10*3/uL (ref 0.00–0.00)
Basophils Absolute Automated: 0.02 10*3/uL (ref 0.00–0.08)
Basophils Automated: 0.4 %
Eosinophils Absolute Automated: 0.08 10*3/uL (ref 0.00–0.44)
Eosinophils Automated: 1.5 %
Hematocrit: 39.5 % (ref 34.7–43.7)
Hgb: 12.1 g/dL (ref 11.4–14.8)
Immature Granulocytes Absolute: 0 10*3/uL (ref 0.00–0.07)
Immature Granulocytes: 0 %
Instrument Absolute Neutrophil Count: 3.18 10*3/uL (ref 1.10–6.33)
Lymphocytes Absolute Automated: 1.75 10*3/uL (ref 0.42–3.22)
Lymphocytes Automated: 32.3 %
MCH: 27.4 pg (ref 25.1–33.5)
MCHC: 30.6 g/dL — ABNORMAL LOW (ref 31.5–35.8)
MCV: 89.4 fL (ref 78.0–96.0)
MPV: 11.7 fL (ref 8.9–12.5)
Monocytes Absolute Automated: 0.38 10*3/uL (ref 0.21–0.85)
Monocytes: 7 %
Neutrophils Absolute: 3.18 10*3/uL (ref 1.10–6.33)
Neutrophils: 58.8 %
Nucleated RBC: 0 /100 WBC (ref 0.0–0.0)
Platelets: 322 10*3/uL (ref 142–346)
RBC: 4.42 10*6/uL (ref 3.90–5.10)
RDW: 14 % (ref 11–15)
WBC: 5.41 10*3/uL (ref 3.10–9.50)

## 2022-09-17 LAB — VITAMIN B12: Vitamin B-12: 1130 pg/mL — ABNORMAL HIGH (ref 211–911)

## 2022-09-17 LAB — LIPID PANEL
Cholesterol / HDL Ratio: 3.4 Index
Cholesterol / HDL Ratio: 3.4 Index
Cholesterol: 248 mg/dL — ABNORMAL HIGH (ref 0–199)
Cholesterol: 293 mg/dL — ABNORMAL HIGH (ref 0–199)
HDL: 74 mg/dL (ref 40–9999)
HDL: 85 mg/dL (ref 40–9999)
LDL Calculated: 158 mg/dL — ABNORMAL HIGH (ref 0–99)
LDL Calculated: 195 mg/dL — ABNORMAL HIGH (ref 0–99)
Triglycerides: 80 mg/dL (ref 34–149)
Triglycerides: 67 mg/dL (ref 34–149)
VLDL Calculated: 16 mg/dL (ref 10–40)
VLDL Calculated: 13 mg/dL (ref 10–40)

## 2022-09-17 LAB — MAGNESIUM: Magnesium: 2.2 mg/dL (ref 1.6–2.6)

## 2022-09-17 LAB — HEMOLYSIS INDEX: Hemolysis Index: 10 Index (ref 0–24)

## 2022-09-17 LAB — HIGH SENSITIVITY TROPONIN-I: hs Troponin-I: <2.7 ng/L

## 2022-09-17 MED ORDER — CARBOXYMETHYLCELLULOSE SOD PF 0.5 % OP SOLN
1.0000 [drp] | Freq: Three times a day (TID) | OPHTHALMIC | Status: DC | PRN
Start: 2022-09-17 — End: 2022-09-17

## 2022-09-17 MED ORDER — MELATONIN 3 MG PO TABS
3.0000 mg | ORAL_TABLET | Freq: Every evening | ORAL | Status: DC | PRN
Start: 2022-09-17 — End: 2022-09-17

## 2022-09-17 MED ORDER — EZETIMIBE 10 MG PO TABS
10.0000 mg | ORAL_TABLET | Freq: Every day | ORAL | Status: DC
Start: 2022-09-17 — End: 2022-09-17
  Administered 2022-09-17 (×2): 10 mg via ORAL
  Filled 2022-09-17 (×2): qty 1

## 2022-09-17 MED ORDER — ENOXAPARIN SODIUM 40 MG/0.4ML IJ SOSY
40.0000 mg | PREFILLED_SYRINGE | Freq: Every day | INTRAMUSCULAR | Status: DC
Start: 2022-09-17 — End: 2022-09-17
  Administered 2022-09-17: 40 mg via SUBCUTANEOUS
  Filled 2022-09-17: qty 0.4

## 2022-09-17 MED ORDER — MECLIZINE HCL 12.5 MG PO TABS
25.0000 mg | ORAL_TABLET | Freq: Three times a day (TID) | ORAL | Status: DC | PRN
Start: 2022-09-17 — End: 2022-09-17

## 2022-09-17 MED ORDER — DEXTROSE 10 % IV BOLUS
12.5000 g | INTRAVENOUS | Status: DC | PRN
Start: 2022-09-17 — End: 2022-09-17

## 2022-09-17 MED ORDER — LISINOPRIL 10 MG PO TABS
40.0000 mg | ORAL_TABLET | Freq: Every day | ORAL | Status: DC
Start: 2022-09-17 — End: 2022-09-17
  Administered 2022-09-17: 40 mg via ORAL
  Filled 2022-09-17: qty 4

## 2022-09-17 MED ORDER — ROSUVASTATIN CALCIUM 10 MG PO TABS
40.0000 mg | ORAL_TABLET | Freq: Every evening | ORAL | Status: DC
Start: 2022-09-17 — End: 2022-09-17

## 2022-09-17 MED ORDER — LEVOTHYROXINE SODIUM 25 MCG PO TABS
25.0000 ug | ORAL_TABLET | Freq: Every morning | ORAL | Status: DC
Start: 2022-09-17 — End: 2022-09-17
  Administered 2022-09-17: 25 ug via ORAL
  Filled 2022-09-17: qty 1

## 2022-09-17 MED ORDER — SALINE SPRAY 0.65 % NA SOLN
2.0000 | NASAL | Status: DC | PRN
Start: 2022-09-17 — End: 2022-09-17

## 2022-09-17 MED ORDER — EZETIMIBE 10 MG PO TABS
10.0000 mg | ORAL_TABLET | Freq: Every day | ORAL | 0 refills | Status: DC
Start: 2022-09-17 — End: 2024-02-08

## 2022-09-17 MED ORDER — PANTOPRAZOLE SODIUM 40 MG PO TBEC
40.0000 mg | DELAYED_RELEASE_TABLET | Freq: Every morning | ORAL | Status: DC
Start: 2022-09-17 — End: 2022-09-17
  Administered 2022-09-17: 40 mg via ORAL
  Filled 2022-09-17: qty 1

## 2022-09-17 MED ORDER — HYDRALAZINE HCL 20 MG/ML IJ SOLN
10.0000 mg | INTRAMUSCULAR | Status: DC | PRN
Start: 2022-09-17 — End: 2022-09-17

## 2022-09-17 MED ORDER — GLUCAGON 1 MG IJ SOLR (WRAP)
1.0000 mg | INTRAMUSCULAR | Status: DC | PRN
Start: 2022-09-17 — End: 2022-09-17

## 2022-09-17 MED ORDER — ACETAMINOPHEN 325 MG PO TABS
650.0000 mg | ORAL_TABLET | Freq: Four times a day (QID) | ORAL | Status: DC | PRN
Start: 2022-09-17 — End: 2022-09-17

## 2022-09-17 MED ORDER — AMLODIPINE BESYLATE 5 MG PO TABS
10.0000 mg | ORAL_TABLET | Freq: Every day | ORAL | Status: DC
Start: 2022-09-17 — End: 2022-09-17
  Administered 2022-09-17: 10 mg via ORAL
  Filled 2022-09-17: qty 2

## 2022-09-17 MED ORDER — LABETALOL HCL 5 MG/ML IV SOLN (WRAP)
10.0000 mg | INTRAVENOUS | Status: DC | PRN
Start: 2022-09-17 — End: 2022-09-17

## 2022-09-17 MED ORDER — ROSUVASTATIN CALCIUM 40 MG PO TABS
40.0000 mg | ORAL_TABLET | Freq: Every day | ORAL | 0 refills | Status: DC
Start: 2022-09-17 — End: 2024-02-08

## 2022-09-17 MED ORDER — GLUCOSE 40 % PO GEL (WRAP)
15.0000 g | ORAL | Status: DC | PRN
Start: 2022-09-17 — End: 2022-09-17

## 2022-09-17 MED ORDER — ASPIRIN 81 MG PO CHEW
81.0000 mg | CHEWABLE_TABLET | Freq: Every day | ORAL | Status: DC
Start: 2022-09-17 — End: 2022-09-17
  Administered 2022-09-17: 81 mg via ORAL
  Filled 2022-09-17: qty 1

## 2022-09-17 MED ORDER — NALOXONE HCL 0.4 MG/ML IJ SOLN (WRAP)
0.2000 mg | INTRAMUSCULAR | Status: DC | PRN
Start: 2022-09-17 — End: 2022-09-17

## 2022-09-17 MED ORDER — ANASTROZOLE 1 MG PO TABS
1.0000 mg | ORAL_TABLET | Freq: Every morning | ORAL | Status: DC
  Administered 2022-09-17 (×2): 1 mg via ORAL
  Filled 2022-09-17 (×2): qty 1

## 2022-09-17 MED ORDER — DEXTROSE 50 % IV SOLN
12.5000 g | INTRAVENOUS | Status: DC | PRN
Start: 2022-09-17 — End: 2022-09-17

## 2022-09-17 MED ORDER — GABAPENTIN 100 MG PO CAPS
100.0000 mg | ORAL_CAPSULE | Freq: Two times a day (BID) | ORAL | Status: DC | PRN
Start: 2022-09-17 — End: 2022-09-17

## 2022-09-17 MED ORDER — GADOTERATE MEGLUMINE 7.5 MMOL/15ML IV SOLN (CLARISCAN)
15.0000 mL | Freq: Once | INTRAVENOUS | Status: AC | PRN
Start: 2022-09-17 — End: 2022-09-17
  Administered 2022-09-17: 15 mL via INTRAVENOUS

## 2022-09-17 NOTE — H&P (Signed)
ADMISSION HISTORY AND PHYSICAL EXAM    [facility], DIVISION OF HOSPITALIST MEDICINE   [facility]   [place] Pager: [phone]      Date Time: 09/17/22 4:06 AM  Patient Name: Emily [name]  Attending Physician: Henderson Newcomer, MD  Primary Care Physician: Rondel Baton, MD    CC: left sided weakness and numbness  History Gathered From: Self and at bedside    Assessment:     Active Hospital Problems    Diagnosis    Left sided numbness     Priority: High    Left-sided weakness     Priority: High    Dizziness     Priority: Medium    Hypothyroid    Mixed hyperlipidemia    Hypertension    Malignant neoplasm of overlapping sites of right female breast     Emily [name] is a 76 y.o. female PMH HTN, HLD, breast CA 2014, 2017, hypothyroidism, GERD, LBP, dizziness presenting with LUE and LLE weakness. Pt reports this started around 2 pm today. Also notes when she stands she feels her left leg is going to go out from under her. Also c/o dizziness today. Pt had admission here for dizziness work up with neg stroke work up and was discharged with meclizine. Pt did not try meclizine for the dizziness. Attempted to review medic with pt but she is unclear on many of them and what she is taking. Unsure if this is a new finding. Pt does live alone but daughter lives nearby. Denies vision changes/diplopia. Denies speech diffic. Denies H/A.   Denies CP, SOB, cough, F/C, abd pain, urinary symptoms.    Plan:   -Admit to [facility] Hospitalist service  #Left sided weakness  #Left sided numbness  #?confusion, unclear if this is new   r/o CVA  - admit Obs  - NCHCT: no acute abnormality  - Consult Neuro in am - Dr. Francesco Sor  - initiate stroke pathway with Q 4 hr neuro checks  - on asa, statin  - check new A1C and lipid in am  - A1C 01/2022: 5.4  - Lipids 05/2022: chol 265;HDL 72; LDL 181; TGL 61  - pt recently had ezetimibe added to medic regimen by cardiology, Dr. Celene Skeen. Pt was already on rosuvastatin  and pt states she has been compliant but last dispense of it was a 30 day supply in Aug. Pt now unclear if she is taking  - Permissive HTN  - LKW 2 pm Fri; OOW for TNK  - monitor for dysrhythmias on tele  - 12 lead shows SB  - PT/OT consults  - passed bedside swallow  - MRI brain: No acute process; stable chronic small vessel ischemic changes and white matter  - MRA head/neck: No evidence of aneurysm or significant stenosis or major branch occlusion intracranially; no hemodynamically significant carotid or vertebral artery stenosis, dissection, or occlusion within the neck  - meclizine prn dizziness  - nutritional markers pending    #HTN  #HLD  - cont home amlodipine, lisinopril  - cont rosuvastatin, ezetimibe    #breast cancer  - cont home anastrozole    #Hypothyroidism  - cont home levothyroxine    #GERD  - pantoprazole as subst for non-formulary omeprazole        Patient has BMI=Body mass index is 26.87 kg/m.  Diagnosis: Overweight based on BMI criteria         Nutrition:Diet heart healthy  VTE Prophylaxis- lovenox    Code status:  Full code     Status/Disposition:   Pt is admitted under OBSERVATION with above concerns.    Anticipated medical stability for discharge: 24 Hrs       History of Presenting Illnes   Emily [name] is a 76 y.o. female PMH HTN, HLD, breast CA 2014, 2017, hypothyroidism, GERD, LBP, dizziness presenting with LUE and LLE weakness. Pt reports this started around 2 pm today. Also notes when she stands she feels her left leg is going to go out from under her. Also c/o dizziness today. Pt had admission here for dizziness work up with neg stroke work up and was discharged with meclizine. Pt did not try meclizine for the dizziness. Attempted to review medic with pt but she is unclear on many of them and what she is taking. Unsure if this is a new finding. Pt does live alone but daughter lives nearby. Denies vision changes/diplopia. Denies speech diffic. Denies H/A.   Denies CP, SOB, cough,  F/C, abd pain, urinary symptoms.    Past Medical Histor     Past Medical History:   Diagnosis Date    Breast cancer 2014    right breast mastectomy    Exercise-induced angina     negative work up    GERD (gastroesophageal reflux disease)     Headache     resolved    Hyperlipidemia     Hypertension     Hypothyroidism     Low back pain     Malignant neoplasm of overlapping sites of right female breast 10/19/2015    Syncope and collapse     Chronic Vertigo    Vertigo          Available old records reviewed, including: EPIC     Past Surgical History:     Past Surgical History:   Procedure Laterality Date    COLONOSCOPY  2021    COLONOSCOPY, POLYPECTOMY N/A 07/21/2020    Procedure: COLONOSCOPY, POLYPECTOMY;  Surgeon: Einar Grad, MD;  Location: [facility];  Service: Gastroenterology;  Laterality: N/A;    EGD, BIOPSY N/A 07/21/2020    Procedure: EGD, BIOPSY;  Surgeon: Einar Grad, MD;  Location: [facility];  Service: Gastroenterology;  Laterality: N/A;    EXPLORATORY LAPAROTOMY N/A 05/22/2021    Procedure: EXPLORATORY LAPAROTOMY;  Surgeon: Delena Serve, MD;  Location: [facility];  Service: General;  Laterality: N/A;    HYSTERECTOMY      LAPAROSCOPY, DIAGNOSTIC N/A 05/22/2021    Procedure: LAPAROSCOPY, DIAGNOSTIC;  Surgeon: Delena Serve, MD;  Location: [facility];  Service: General;  Laterality: N/A;    LAPAROTOMY, COLECTOMY, RIGHT Right 05/22/2021    Procedure: LAPAROTOMY, COLECTOMY, RIGHT;  Surgeon: Delena Serve, MD;  Location: [facility];  Service: General;  Laterality: Right;    MASTECTOMY Right 2015       Family History:     Family History   Problem Relation Age of Onset    Breast cancer Neg Hx        Social History:    reports that she has never smoked. She has never used smokeless tobacco. She reports current alcohol use. She reports that she does not use drugs.    Allergies:     Allergies   Allergen Reactions    Morphine Itching    Morphine And Related Itching and Nausea And  Vomiting     dizzy       Medications:     Home  Medications       Med List Status: Complete Set By: Doristine Bosworth, RN at 09/17/2022  2:14 AM              amLODIPine (NORVASC) 10 MG tablet     Take 1 tablet (10 mg total) by mouth daily     anastrozole (ARIMIDEX) 1 MG tablet     Take 1 tablet (1 mg total) by mouth every morning     aspirin 81 MG chewable tablet     Chew 1 tablet (81 mg total) by mouth daily     celecoxib (CeleBREX) 200 MG capsule     Take 1 capsule (200 mg) by mouth daily     diclofenac Sodium (VOLTAREN) 1 % Gel topical gel     Apply 2 g topically 4 (four) times daily     ezetimibe (ZETIA) 10 MG tablet     Take 1 tablet (10 mg) by mouth daily     Ferrous Sulfate (IRON PO)     Take by mouth     furosemide (LASIX) 10 MG/ML solution     Take 1 mL (10 mg) by mouth daily     gabapentin (NEURONTIN) 100 MG capsule     Take 1 capsule (100 mg total) by mouth 2 (two) times daily as needed (Neuropathy)     levothyroxine (SYNTHROID) 25 MCG tablet     Take 1 tablet (25 mcg total) by mouth every morning     lisinopril (ZESTRIL) 40 MG tablet     Take 1 tablet (40 mg total) by mouth daily     meclizine (ANTIVERT) 25 MG tablet     Take 1 tablet (25 mg) by mouth 3 (three) times daily as needed for Dizziness     Multiple Vitamins-Minerals (MULTIVITAMIN WITH MINERALS) tablet     Take 1 tablet by mouth every morning     pantoprazole (PROTONIX) 40 MG tablet     Take 1 tablet (40 mg) by mouth daily     rosuvastatin (CRESTOR) 40 MG tablet     Take 1 tablet (40 mg) by mouth daily            Method by which medications were confirmed on admission: at bedside with pt but she is unsure of many of her meds.      Review of Systems:   All other systems were reviewed and are negative except:as above in HPI     Physical Exam:   Patient Vitals for the past 24 hrs:   BP Temp Temp src Pulse Resp SpO2 Height Weight   09/17/22 0144 (!) 203/83 97.8 F (36.6 C) Oral 60 -- 99 % 1.6 m (5\' 3" ) 68.8 kg (151 lb 10.8 oz)   09/16/22 2354 (!)  192/94 97.9 F (36.6 C) Oral (!) 49 20 97 % -- --   09/16/22 2300 195/79 -- -- (!) 55 17 97 % -- --   09/16/22 2230 (!) 211/105 -- -- (!) 53 17 97 % -- --   09/16/22 2200 (!) 222/92 -- -- (!) 45 17 98 % -- --   09/16/22 2059 (!) 205/91 -- -- (!) 53 15 98 % -- --   09/16/22 2014 (!) 210/86 98.2 F (36.8 C) -- (!) 59 20 98 % 1.626 m (5\' 4" ) 69.1 kg (152 lb 5.4 oz)     Body mass index is 26.87 kg/m.  No intake or output data in the 24 hours ending 09/17/22 0406    General:  WD female in no acute distress.  HEENT: perrla, eomi, sclera anicteric  oropharynx clear without lesions, mucous membranes moist  Neck: supple  Cardiovascular: Normal S1 and S2, no murmurs, rubs or gallops  Lungs: clear to auscultation bilaterally, without wheezing, rhonchi, or rales  Abdomen: soft, non-tender, non-distended; normoactive bowel sounds, no rebound or guarding  Extremities: no edema , pulses palpable   Neuro: alert, oriented x 3, cranial nerves grossly intact, strength 5/5 in upper and lower extremities, sensation intact NIH 3-5. Was unable to follow directions when assessing pupil gaze, pt kept closing eyes.   Skin: no rashes or lesions noted        Labs:     Results       Procedure Component Value Units Date/Time    Lipid panel [[id number]] Collected: 09/16/22 2043    Specimen: Blood Updated: 09/16/22 2345    Narrative:      Fasting specimen    High Sensitivity Troponin-I at 2 hrs with calculated Delta [[id number]] Collected: 09/16/22 2259    Specimen: Blood Updated: 09/16/22 2328     hs Troponin-I <2.7 ng/L      hs Troponin-I Delta Unable toCalc. ng/L     High Sensitivity Troponin-I [[id number]] Collected: 09/16/22 2043    Specimen: Blood Updated: 09/16/22 2124     hs Troponin-I <2.7 ng/L     Comprehensive metabolic panel [[id number]]  (Abnormal) Collected: 09/16/22 2043    Specimen: Blood Updated: 09/16/22 2121     Glucose 92 mg/dL      BUN 36.6 mg/dL      Creatinine 1.1 mg/dL      Sodium 440 mEq/L      Potassium 4.0 mEq/L       Chloride 108 mEq/L      CO2 30 mEq/L      Calcium 9.2 mg/dL      Protein, Total 6.8 g/dL      Albumin 3.9 g/dL      AST (SGOT) 20 U/L      ALT 11 U/L      Alkaline Phosphatase 79 U/L      Bilirubin, Total 0.2 mg/dL      Globulin 2.9 g/dL      Albumin/Globulin Ratio 1.3     Anion Gap 7.0     eGFR 51.7 mL/min/1.73 m2     Prothrombin time/INR [[id number]] Collected: 09/16/22 2048    Specimen: Blood Updated: 09/16/22 2108     PT 11.0 sec      PT INR 0.9    APTT [[id number]] Collected: 09/16/22 2048     Updated: 09/16/22 2108     PTT 35 sec     CBC and differential [[id number]]  (Abnormal) Collected: 09/16/22 2043    Specimen: Blood Updated: 09/16/22 2100     WBC 6.30 x10 3/uL      Hgb 10.6 g/dL      Hematocrit 06.3 %      Platelets 313 x10 3/uL      RBC 3.87 x10 6/uL      MCV 89.9 fL      MCH 27.4 pg      MCHC 30.5 g/dL      RDW 14 %      MPV 11.4 fL      Instrument Absolute Neutrophil Count 3.14 x10 3/uL      Neutrophils 49.8 %      Lymphocytes Automated 39.5 %      Monocytes 7.8 %      Eosinophils Automated 2.2 %  Basophils Automated 0.5 %      Immature Granulocytes 0.2 %      Nucleated RBC 0.0 /100 WBC      Neutrophils Absolute 3.14 x10 3/uL      Lymphocytes Absolute Automated 2.49 x10 3/uL      Monocytes Absolute Automated 0.49 x10 3/uL      Eosinophils Absolute Automated 0.14 x10 3/uL      Basophils Absolute Automated 0.03 x10 3/uL      Immature Granulocytes Absolute 0.01 x10 3/uL      Absolute NRBC 0.00 x10 3/uL     Glucose Whole Blood - POCT [[id number]]  (Abnormal) Collected: 09/16/22 2021     Updated: 09/16/22 2031     Whole Blood Glucose POCT 105 mg/dL              EKG: SB, NSTWA lateral leads    Imaging personally reviewed, including: all available   MR Angiogram Head WO Contrast    Result Date: 09/17/2022  No evidence of an aneurysm, significant stenosis or major branch occlusion intracranially. Alric Seton MD, MD 09/17/2022 1:59 AM    MRI Brain W WO Contrast    Result Date: 09/17/2022   1. No MR evidence  of acute process. 2. Stable chronic small vessel ischemic changes in white matter. Ivin Poot, MD 09/17/2022 1:57 AM    MR Angiogram Neck W WO Contrast    Result Date: 09/17/2022  No hemodynamically significant carotid or vertebral artery is stenosis, dissection or occlusion within the neck. Alric Seton MD, MD 09/17/2022 1:57 AM    CT Head WO Contrast    Result Date: 09/16/2022  No acute abnormality detected. Carleene Overlie, MD 09/16/2022 10:09 PM    Chest AP Portable    Result Date: 09/16/2022   No acute cardiopulmonary abnormality. Gustavus Messing, MD 09/16/2022 9:26 PM         This note was generated by the Epic EMR system/ Dragon speech recognition and may contain inherent errors or omissions not intended by the user. Grammatical errors, random word insertions, deletions and pronoun errors  are occasional consequences of this technology due to software limitations. Not all errors are caught or corrected. If there are questions or concerns about the content of this note or information contained within the body of this dictation they should be addressed directly with the author for clarification.    Signed by: Alena Bills, NP  GE:[id number], Collie Siad, MD

## 2022-09-17 NOTE — Progress Notes (Signed)
4 eyes in 4 hours pressure injury assessment note:      Completed with: Amy  Unit & Time admitted: observation at 0144             Bony Prominences: Check appropriate box; if wound is present enter wound assessment in LDA     Occiput:                 [x] WNL  []  Wound present  Face:                     [x] WNL  []  Wound present  Ears:                      [x] WNL  []  Wound present  Spine:                    [x] WNL  []  Wound present  Shoulders:             [x] WNL  []  Wound present  Elbows:                  [x] WNL  []  Wound present  Sacrum/coccyx:     [x] WNL  []  Wound present  Ischial Tuberosity:  [x] WNL  []  Wound present  Trochanter/Hip:      [x] WNL  []  Wound present  Knees:                   [x] WNL  []  Wound present  Ankles:                   [x] WNL  []  Wound present  Heels:                    [x] WNL  []  Wound present  Other pressure areas:  []  Wound location       Device related: []  Device name:         LDA completed if wound present: yes/no  Consult WOCN if necessary    Other skin related issues, ie tears, rash, etc, document in Integumentary flowsheet

## 2022-09-17 NOTE — Consults (Signed)
NEUROLOGY CONSULTATION    Date Time: 09/17/22 1:07 PM  Patient Name: Emily [name]  Attending Physician: Alfred Levins, MD      Assessment & Plan:   Episode of left-sided numbness still ongoing in the setting of negative brain MRI not sure what to make of this given patient has had multiple such admissions in the past?  Some form of functional disorder  Remain on aspirin  Address hyperlipidemia  Given MRI MRA studies negative no further neurowork-up required  Please call if new questions can be discharged from my standpoint        History of Present Illness:   Neurology consultation requested by:--> Dr. Nonda Lou  76 year old lady comes into the hospital with new onset of left-sided arm and leg numbness, symptoms are still ongoing she also reports some lightheadedness and some chest pain of note patient has had multiple prior admissions with similar symptoms in the past with no clear-cut answers and negative testing.  This time as well patient has undergone a brain MRI which is negative, MRA head and neck are negative as well        Recent Labs   Lab 09/16/22  2043   Hemoglobin A1C 5.7*     Recent Labs   Lab 09/16/22  2043   Cholesterol 248*   Triglycerides 80   HDL 74   LDL Calculated 158*     Recent Labs   Lab 09/16/22  2043   LDL Calculated 158*        Past Medical History:     Past Medical History:   Diagnosis Date    Breast cancer 2014    right breast mastectomy    Exercise-induced angina     negative work up    GERD (gastroesophageal reflux disease)     Headache     resolved    Hyperlipidemia     Hypertension     Hypothyroidism     Low back pain     Malignant neoplasm of overlapping sites of right female breast 10/19/2015    Syncope and collapse     Chronic Vertigo    Vertigo        Meds:   Home medications Norvasc Arimidex aspirin Celebrex Zetia iron gabapentin Synthroid lisinopril meclizine      Allergies   Allergen Reactions    Morphine Itching    Morphine And Related Itching and Nausea And Vomiting      dizzy       Social & Family History:     Social History     Socioeconomic History    Marital status: Widowed   Tobacco Use    Smoking status: Never    Smokeless tobacco: Never   Vaping Use    Vaping Use: Never used   Substance and Sexual Activity    Alcohol use: Yes     Comment: occ    Drug use: No    Sexual activity: Not Currently     Social Determinants of Health     Financial Resource Strain: Low Risk  (05/12/2022)    Overall Financial Resource Strain (CARDIA)     Difficulty of Paying Living Expenses: Not hard at all   Food Insecurity: No Food Insecurity (09/16/2022)    Hunger Vital Sign     Worried About Running Out of Food in the Last Year: Never true     Ran Out of Food in the Last Year: Never true   Transportation Needs: No Transportation Needs (05/12/2022)  PRAPARE - Therapist, art (Medical): No     Lack of Transportation (Non-Medical): No   Intimate Partner Violence: Not At Risk (09/16/2022)    Humiliation, Afraid, Rape, and Kick questionnaire     Fear of Current or Ex-Partner: No     Emotionally Abused: No     Physically Abused: No     Sexually Abused: No   Housing Stability: Unknown (05/12/2022)    Housing Stability Vital Sign     Unable to Pay for Housing in the Last Year: No     Unstable Housing in the Last Year: No       Family History   Problem Relation Age of Onset    Breast cancer Neg Hx            CODE STATUS: Full code  I personally reviewed all of the medications.  Medication list generated using all available resources.  Elder abuse (physical)  - negative  Advanced care plan - reviewed from chart or in discussion with pt or family    Review of Systems:   No headache, eye, ear nose, throat problems; no coughing or wheezing or shortness of breath, No chest pain or orthopnea, no abdominal pain, nausea or vomiting, No pain in the body or extremities, no psychiatric, neurological, endocrine, hematological or cardiac complaints except as noted above.     Complaining of chest  pain otherwise negative    Physical Exam:   Blood pressure 155/72, pulse 63, temperature 98.3 F (36.8 C), resp. rate 19, height 1.6 m (5\' 3" ), weight 68.8 kg (151 lb 10.8 oz), SpO2 97 %.    HEENT: Normocephalic.no carotid bruits  Lungs:  CTA bil  Abd Soft   Cardiac:  S1,S2, normal rate and rhythm  Neck: supple, no cartoid bruits  Extremities: no edema  Skin: no rashes seen in exposed areas     Neuro:  Level of consciousness:  Alert and appropriate  Oriented:  X 3  Cognition:  Intact naming, recognition, concentration and following complex commands  Cranial Nerves:  II-XII intact  Strength:  No upper extremity drift, 5/5 strength x 4 extremities  Coordination:  Intact FTN testing  Reflexes:  +2 throughout, down going toes bil  Sensation: Reduced to light touch temperature on the left face arm and leg  Labs:     Recent Labs   Lab 09/16/22  2043   Glucose 92   BUN 16.0   Creatinine 1.1*   Calcium 9.2   Sodium 145   Potassium 4.0   Chloride 108   CO2 30*   Albumin 3.9   AST (SGOT) 20   ALT 11   Bilirubin, Total 0.2   Alkaline Phosphatase 79     Recent Labs   Lab 09/16/22  2043   WBC 6.30   Hgb 10.6*   Hematocrit 34.8   MCV 89.9   MCH 27.4   MCHC 30.5*   Platelets 313         Recent Labs     09/16/22  2048   PTT 35   PT 11.0   PT INR 0.9          Radiology Results (24 Hour)       Procedure Component Value Units Date/Time    MR Angiogram Head WO Contrast [[id number]] Collected: 09/17/22 0157    Order Status: Completed Updated: 09/17/22 0201    Narrative:      Clinical History:    left sided  numbness.     Technique:    MR ANGIOGRAM HEAD WO CONTRAST MRA of the brain was performed without  intravenous contrast as per departmental protocol. Source images and 3-D  MIP reformatted images were submitted for review.    Comparison:    CT angiogram of the neck and brain dated 05/12/2022    Findings:    The internal carotid arteries, middle cerebral arteries and anterior  cerebral arteries are unremarkable. The intracranial  segments of the  vertebral arteries, posterior inferior cerebellar arteries, basilar artery,  superior cerebellar arteries, and posterior cerebral arteries are  unremarkable. The right posterior communicating artery is unremarkable. The  left posterior communicating artery is hypoplastic or absent. There is no  evidence of an aneurysm or major branch occlusion.      Impression:          No evidence of an aneurysm, significant stenosis or major branch occlusion  intracranially.    Alric Seton MD, MD  09/17/2022 1:59 AM    MRI Brain W Ilda Basset Contrast [[id number]] Collected: 09/17/22 0150    Order Status: Completed Updated: 09/17/22 0159    Narrative:      HISTORY: Left-sided numbness    EXAMINATION/TECHNIQUE: MRI of brain performed. Unenhanced images were  obtained in the sagittal and axial planes utilizing T1, proton density,  T2-weighted spin-echo pulse sequences.  Axial images were obtained with  diffusion-weighted technique gradient-echo, and FLAIR technique.  Following  intravenous administration of 15 cc of Clariscan, multiprojectional  T1-weighted images were obtained.    COMPARISON: MR 05/12/2022 and 01/14/2022    FINDINGS: There is no mass effect or midline shift on the unenhanced  images. No abnormal enhancement demonstrated on contrast enhanced  sequences. No MRI evidence of acute hemorrhage or infarct. Ventricular size  is within normal limits. Stable scattered mild focal signal changes in the  subcortical and periventricular white matter most suggestive of chronic  small vessel ischemic changes. Visualized paranasal sinuses  are clear.      Impression:         1. No MR evidence of acute process.  2. Stable chronic small vessel ischemic changes in white matter.    Ivin Poot, MD  09/17/2022 1:57 AM    MR Angiogram Neck W WO Contrast [[id number]] Collected: 09/17/22 0152    Order Status: Completed Updated: 09/17/22 0159    Narrative:      Clinical History:    left sided numbness.     Technique:    MR ANGIOGRAM  NECK W WO CONTRAST MRA of the neck was performed both with and  without intravenous contrast as per departmental protocol. Source images  and 3-D MIP reformatted images were submitted for review. 15 cc Clariscan  intravenous contrast was administered.    Comparison:    CT angiogram of the neck and brain dated 05/12/2022    Findings:    There is a left-sided aortic arch. The common carotid arteries are  unremarkable. The carotid bifurcation is free of flow-limiting stenosis  bilaterally. The internal carotid arteries are unremarkable. The vertebral  arteries are codominant and show no hemodynamically significant stenosis or  occlusion.      Impression:          No hemodynamically significant carotid or vertebral artery is stenosis,  dissection or occlusion within the neck.    Alric Seton MD, MD  09/17/2022 1:57 AM    CT Head WO Contrast [[id number]] Collected: 09/16/22 2208    Order Status: Completed  Updated: 09/16/22 2211    Narrative:      Clinical History:  Neuro deficit, acute stroke suspected. Left-sided numbness.    Examination:  CT HEAD WO CONTRAST    TECHNIQUE:  5 mm helical images obtained from the skull base through the vertex without  contrast. 3 mm sagittal and coronal reformatted images provided.  CT images  were acquired using Automated Exposure Control for dose reduction.     COMPARISON:   06/30/2022    FINDINGS:     There is no evidence of acute intracranial hemorrhage, extra-axial  collection, mass effect, midline shift, herniation or hydrocephalus. The  ventricles, sulci and cisterns are age appropriate.  The gray-white  differentiation is intact.   The visualized paranasal sinuses and mastoid  air cells are clear.   The surrounding soft tissues and osseous structures  are unremarkable.        Impression:          No acute abnormality detected.    Carleene Overlie, MD  09/16/2022 10:09 PM    Chest AP Portable [[id number]] Collected: 09/16/22 2125    Order Status: Completed Updated: 09/16/22 2129     Narrative:      HISTORY: Chest Pain.     TECHNIQUE: Single AP view of the chest.    COMPARISON: 11/11/2021 and several studies going back to 03/04/2021    FINDINGS:  No acute consolidations present. No evidence of pulmonary edema.  There are no pleural effusions.  There is stable cardiomegaly. No  pneumothorax is seen. There are stable reticulonodular scarring within the  right lower lung. Surgical clips project over the right lower lung.       Impression:       No acute cardiopulmonary abnormality.    Gustavus Messing, MD  09/16/2022 9:26 PM             All recent brain and spine imaging (MRI, CT) personally reviewed.    Chart reviewed    Case discussed with: ptminutes; >50% time spent in counseling or coordination of care        This note was generated by the Epic EMR system/Speech recognition and may contain inherent errors or omissions not intended by the user. Grammatical errors, random word insertions, deletions and pronoun errors  are occasional consequences of this technology due to software limitations.   Not all errors are caught or corrected. If there are questions or concerns about the content of this note or information contained within the body of this dictation they should be addressed directly with the author for clarification.    Signed by: Cathe Mons, MD, MD  Spectralink: [phone]      Answering Service: [phone]

## 2022-09-17 NOTE — Plan of Care (Addendum)
Pt is an admission from ED to observation who arrived from MRI. Pt is alert and oriented x4, BP 203/83, repeat BP 197/81, permissive hypertension orders in place, on stroke pathway and neuro checks Q4, passed dysphagia screening, sinus brady on the monitor, HR as low as 40, MD aware, pt denies numbness, tingling,n/v or chest pain at this time. Skin assessment completed, belongings assessed, discussed the plan of care, she expressed understanding, call light within reach, plan of care will continue.    The learning abilities of the patient and/or caregiver have been assessed. Today's individualized plan of care includes stroke education and providing stroke patient hand book. The patient or caregiver states the following personal goal related to the patient's deficit(s): "By discharge I want to be able to go home without feeling dizzy" The plan of care was discussed with the patient and/or caregiver, who agrees to it and demonstrates understanding of the disease process, risk factors, treatment plan, medications and consequences of noncompliance. All questions and concerns were addressed.    Problem: Pain interferes with ability to perform ADL  Goal: Pain at adequate level as identified by patient  Outcome: Progressing  Flowsheets (Taken 09/17/2022 0315)  Pain at adequate level as identified by patient:   Identify patient comfort function goal   Assess pain on admission, during daily assessment and/or before any "as needed" intervention(s)   Include patient/patient care companion in decisions related to pain management as needed     Problem: Day of Admission - Stroke  Goal: Core/Quality measure requirements - Admission  09/17/2022 0315 by Doristine Bosworth, RN  Outcome: Progressing  Flowsheets (Taken 09/17/2022 0309)  Core/Quality measure requirements - Admission:   Document NIH Stroke Scale on admission   Document nursing swallow/dysphagia screen on admission. If patient fails, keep patient NPO (follow your hospital  protocol on swallowing screening).   VTE Prevention: Ensure anticoagulant(s) administered and/or anti-embolism stockings/devices documented as ordered   Ensure antithrombotic administered or contraindication documented by LIP   Ensure lipid panel ordered   Begin stroke education on admission (must include Modifiable Risk Factors, Warning Signs and Symptoms of Stroke, Activation of Emergency Medical System and Follow-up Appointments) Ensure handout has been given and documented.   Ensure PT/OT and/or SLP ordered  09/17/2022 0309 by Doristine Bosworth, RN  Outcome: Progressing  Flowsheets (Taken 09/17/2022 0309)  Core/Quality measure requirements - Admission:   Document NIH Stroke Scale on admission   Document nursing swallow/dysphagia screen on admission. If patient fails, keep patient NPO (follow your hospital protocol on swallowing screening).   VTE Prevention: Ensure anticoagulant(s) administered and/or anti-embolism stockings/devices documented as ordered   Ensure antithrombotic administered or contraindication documented by LIP   Ensure lipid panel ordered   Begin stroke education on admission (must include Modifiable Risk Factors, Warning Signs and Symptoms of Stroke, Activation of Emergency Medical System and Follow-up Appointments) Ensure handout has been given and documented.   Ensure PT/OT and/or SLP ordered     Problem: Every Day - Stroke  Goal: Neurological status is stable or improving  09/17/2022 0315 by Doristine Bosworth, RN  Outcome: Progressing  Flowsheets (Taken 09/17/2022 0309)  Neurological status is stable or improving:   Monitor/assess/document neurological assessment (Stroke: every 4 hours)   Re-assess NIH Stroke Scale for any change in status   Perform CAM Assessment   Monitor/assess NIH Stroke Scale   Observe for seizure activity and initiate seizure precautions if indicated  09/17/2022 0309 by Doristine Bosworth, RN  Outcome: Progressing  Flowsheets (Taken 09/17/2022 0309)  Neurological status is  stable or improving:   Monitor/assess/document neurological assessment (Stroke: every 4 hours)   Re-assess NIH Stroke Scale for any change in status   Perform CAM Assessment   Monitor/assess NIH Stroke Scale   Observe for seizure activity and initiate seizure precautions if indicated  Goal: Stable vital signs and fluid balance  09/17/2022 0315 by Doristine Bosworth, RN  Outcome: Progressing  Flowsheets (Taken 09/17/2022 0309)  Stable vital signs and fluid balance:   Monitor and assess vitals every 4 hours or as ordered and hemodynamic parameters   Apply telemetry monitor as ordered   Encourage oral fluid intake  09/17/2022 0309 by Doristine Bosworth, RN  Outcome: Progressing  Flowsheets (Taken 09/17/2022 0309)  Stable vital signs and fluid balance:   Monitor and assess vitals every 4 hours or as ordered and hemodynamic parameters   Apply telemetry monitor as ordered   Encourage oral fluid intake     Problem: Safety  Goal: Patient will be free from injury during hospitalization  Outcome: Progressing  Flowsheets (Taken 09/17/2022 0315)  Patient will be free from injury during hospitalization:   Assess patient's risk for falls and implement fall prevention plan of care per policy   Ensure appropriate safety devices are available at the bedside   Include patient/ family/ care giver in decisions related to safety   Hourly rounding   Provide and maintain safe environment   Assess for patients risk for elopement and implement Elopement Risk Plan per policy  Goal: Patient will be free from infection during hospitalization  Outcome: Progressing  Flowsheets (Taken 09/17/2022 0315)  Free from Infection during hospitalization:   Assess and monitor for signs and symptoms of infection   Monitor all insertion sites (i.e. indwelling lines, tubes, urinary catheters, and drains)   Encourage patient and family to use good hand hygiene technique   Monitor lab/diagnostic results     Problem: Pain  Goal: Pain at adequate level as identified by  patient  Outcome: Progressing  Flowsheets (Taken 09/17/2022 0315)  Pain at adequate level as identified by patient:   Identify patient comfort function goal   Assess pain on admission, during daily assessment and/or before any "as needed" intervention(s)   Include patient/patient care companion in decisions related to pain management as needed     Problem: Discharge Barriers  Goal: Patient will be discharged home or other facility with appropriate resources  Outcome: Progressing  Flowsheets (Taken 09/17/2022 0315)  Discharge to home or other facility with appropriate resources:   Provide appropriate patient education   Provide information on available health resources   Initiate discharge planning

## 2022-09-17 NOTE — OT Eval Note (Signed)
Occupational Therapy Evaluation  Emily Ehrich Haste        Post Acute Care Therapy Recommendations:     Discharge Recommendations:  Home with supervision, Home with home health OT    DME needs IF patient is discharging home: Grab bars, Shower chair    Therapy discharge recommendations may change with patient status.  Please refer to most recent note for up-to-date recommendations.         [facility]  [address]  [place], [place] [address]  [phone]    Occupational Therapy Evaluation    Patient: Emily [name] MRN: [id number]   Unit: [facility] Bed: E 31/E31    Time of treatment:   OT Received On: 09/17/22  Start Time: 0900  Stop Time: 0910  Time Calculation (min): 10 min       Consult received for Emily [name] for OT evaluation and treatment.  Patient's medical condition is appropriate for Occupational Therapy  intervention at this time.    Interpreter utilized: no, not indicated    Assessment     Emily [name] is a 76 y.o. female admitted 09/16/2022 with L side weakness, numbness/tingling, dizziness; r/o CVA. MRI (-) for acute process. Pt with hx breast ca s/p R mastectomy, HTN, HLD, hypothyroidism, GERD, LBP, dizziness. At baseline pt lives alone in an apartment with 6+6 STE with rail; bathroom setup tub/shower combo with no modifications. Pt PLOF indep for ADLs/IADLs, amb with no AD, driving. Pt received amb out of room with PT, handoff appreciated, educated on role of OT in acute care setting and agreeable to initial evaluation. Pt amb 10' mod I, completed oral hygiene standing sinkside with mod I. Pt returned to EOB, completed LBD sock mgmt in tailor sit, left with needs in place. Pt's ability to complete ADLs and functional transfers is at/near fxn'l baseline. Pt does not have any acute OT needs at this time. D/C OT. D/c recommendation to home with supv, HHOT (home safety evaluation).       Brief chart review completed including review of labs  review of imaging review of vitals.          Complexity Chart Review Performance Deficits Clinical Decision Making Hx/Comorbidities Assistance needed   Low  Brief  1-3 Limited options None None (or at baseline)       PMP - Progressive Mobility Protocol   PMP Activity: Step 7 - Walks out of Room  Distance Walked (ft) (Step 6,7): 10 Feet     Interdisciplinary Communication: PT, RN    Plan     OT Plan  Risks/Benefits/POC Discussed with Pt/Family: With patient  Treatment Interventions: No skilled interventions needed at this time  Discharge Recommendation: Home with supervision;Home with home health OT  DME Recommended for Discharge: Grab bars;Shower chair  OT Frequency Recommended: one time visit - therapy discontinued         Medical Diagnosis: Left sided numbness [R20.0]    History of Present Illness: Emily [name] is a 76 y.o. female admitted on  09/16/2022 with "PMH HTN, HLD, breast CA 2014, 2017, hypothyroidism, GERD, LBP, dizziness presenting with LUE and LLE weakness. Pt reports this started around 2 pm today. Also notes when she stands she feels her left leg is going to go out from under her. Also c/o dizziness today. Pt had admission here for dizziness work up with neg stroke work up and was discharged with meclizine. Pt did not try meclizine for the dizziness. Attempted  to review medic with pt but she is unclear on many of them and what she is taking. Unsure if this is a new finding. Pt does live alone but daughter lives nearby. Denies vision changes/diplopia. Denies speech diffic. Denies H/A.   Denies CP, SOB, cough, F/C, abd pain, urinary symptoms."        Patient Active Problem List   Diagnosis    Malignant neoplasm of overlapping sites of right female breast    Hypertension    Mixed hyperlipidemia    Hypothyroid    Left-sided weakness    Right sided numbness    COVID-19 ruled out by laboratory testing    Abnormal CT scan, gastrointestinal tract    History of hypothyroidism    History of breast  cancer    History of colonic polyps    Dizziness    Left sided numbness       Past Medical History:   Diagnosis Date    Breast cancer 2014    right breast mastectomy    Exercise-induced angina     negative work up    GERD (gastroesophageal reflux disease)     Headache     resolved    Hyperlipidemia     Hypertension     Hypothyroidism     Low back pain     Malignant neoplasm of overlapping sites of right female breast 10/19/2015    Syncope and collapse     Chronic Vertigo    Vertigo        Past Surgical History:   Procedure Laterality Date    COLONOSCOPY  2021    COLONOSCOPY, POLYPECTOMY N/A 07/21/2020    Procedure: COLONOSCOPY, POLYPECTOMY;  Surgeon: Einar Grad, MD;  Location: [facility];  Service: Gastroenterology;  Laterality: N/A;    EGD, BIOPSY N/A 07/21/2020    Procedure: EGD, BIOPSY;  Surgeon: Einar Grad, MD;  Location: [facility];  Service: Gastroenterology;  Laterality: N/A;    EXPLORATORY LAPAROTOMY N/A 05/22/2021    Procedure: EXPLORATORY LAPAROTOMY;  Surgeon: Delena Serve, MD;  Location: [facility];  Service: General;  Laterality: N/A;    HYSTERECTOMY      LAPAROSCOPY, DIAGNOSTIC N/A 05/22/2021    Procedure: LAPAROSCOPY, DIAGNOSTIC;  Surgeon: Delena Serve, MD;  Location: [facility];  Service: General;  Laterality: N/A;    LAPAROTOMY, COLECTOMY, RIGHT Right 05/22/2021    Procedure: LAPAROTOMY, COLECTOMY, RIGHT;  Surgeon: Delena Serve, MD;  Location: [facility];  Service: General;  Laterality: Right;    MASTECTOMY Right 2015       Tests/Labs:  Lab Results   Component Value Date/Time    HGB 10.6 (L) 09/16/2022 08:43 PM    HCT 34.8 09/16/2022 08:43 PM    K 4.0 09/16/2022 08:43 PM    NA 145 09/16/2022 08:43 PM    INR 0.9 09/16/2022 08:48 PM    TROPI <2.7 09/16/2022 10:59 PM    TROPI Unable toCalc. 09/16/2022 10:59 PM    TROPI <2.7 09/16/2022 08:43 PM    TROPI <2.7 06/30/2022 11:36 AM    TROPI <2.7 05/12/2022 10:17 AM    TROPI Unable toCalc. 05/12/2022 10:17 AM    TROPI  0.01 07/15/2009 09:40 AM         Imaging:  MR Angiogram Head WO Contrast    Result Date: 09/17/2022  No evidence of an aneurysm, significant stenosis or major branch occlusion intracranially. Emily Seton MD, MD 09/17/2022 1:59 AM  MRI Brain W WO Contrast    Result Date: 09/17/2022   1. No MR evidence of acute process. 2. Stable chronic small vessel ischemic changes in white matter. Ivin Poot, MD 09/17/2022 1:57 AM    MR Angiogram Neck W WO Contrast    Result Date: 09/17/2022  No hemodynamically significant carotid or vertebral artery is stenosis, dissection or occlusion within the neck. Emily Seton MD, MD 09/17/2022 1:57 AM    CT Head WO Contrast    Result Date: 09/16/2022  No acute abnormality detected. Carleene Overlie, MD 09/16/2022 10:09 PM    Chest AP Portable    Result Date: 09/16/2022   No acute cardiopulmonary abnormality. Gustavus Messing, MD 09/16/2022 9:26 PM      Social History:   Lives alone in an apartment.  Entry Steps: 6+6   Rails: Y   Equipment at home:  rolling walker and tub/shower combination  Prior Level of Function:  Driving    Cognition: WFL    Mobility: indep   Feeding: indep   Grooming: indep   Bathing: indep   Dressing: indep   Toileting: indep    Subjective   "I hope they send me someone nice."  Patient is agreeable to participation in the therapy session. Nursing clears patient for therapy.  Patient's Goal:  to go home  Pain: Pt unable to quantify  Location: L shoulder  Therapist Intervention: positioned for comfort  Patient is satisfied with therapist intervention.    Objective     Precautions:   Precautions  Weight Bearing Status: no restrictions  Restricted BP Precautions: no R UE  Other Precautions: falls, aspiration, seizure, skin    Patient is out of bed, ambulating with intravenous access in place.       Observation of patient/vitals:   Vitals:    09/17/22 0144 09/17/22 0408 09/17/22 0715 09/17/22 0836   BP: (!) 203/83 174/73 193/81 186/81   Pulse: 60 (!) 48 (!) 45    Resp:  16 15     Temp: 97.8 F (36.6 C) 97.8 F (36.6 C) 97.9 F (36.6 C)    TempSrc: Oral Oral Oral    SpO2: 99% 97% 97%    Weight: 68.8 kg (151 lb 10.8 oz)      Height: 1.6 m (5\' 3" )          Orientation/Cognition:     Alert and Oriented x 4  Cognition: follows all commands      Musculoskeletal Examination:     ROM Strength   Neck/ Trunk WFL WFL   RUE WFL WFL   LUE WFL WFL         Sensation: Intact to light touch, reports intermittent numbness/tingling throughout L palmar/dorsal aspect of hand and L shoulder   Coordination: Intact gross motor and serial opposition to B hands    Vision: WFL  Hearing: WFL      Functional Mobility:  Sit to stand: mod I  Stand to sit: mod I  Ambulation: 10' mod I    Balance:  Static Sit Balance: good  Dynamic Sit Balance: good  Static Stand Balance: good  Dynamic Stand Balance: fair+    Self Care:  Grooming: mod I  LB Dressing: mod I      Endurance: good    Participation:  good    Education:  Educated the patient/family/caregiver to role of occupational therapy, plan of care, goals  of therapy, rationale for progressing mobility and safety with mobility and ADLs and home safety.  RN notified of session outcome and that patient was left in EOB with all needs met and equipment intact.   Safety measures include: handoff to nurse/clin tech/ unit secretary completed.   Mobility and ADL status posted at bedside and within E.M.R.        AM-PACT "6 Clicks" Daily Activity Inpatient Short Form  Inpatient AM-PACT Performed?: yes  Put On/Take Off Lower Body Clothing: 4  Assist with Bathing: 4  Assist with Toileting: 4  Put On/Take Off Upper Body Clothing: 4  Assist with Grooming: 4  Assist with Eating: 4  OT Daily Activity Raw Score: 24  CMS 0-100% Score: 0.00%       Goals:  Goals  Goal Formulation: Patient  Time For Goal Achievement: other (comment) (goals not indicated)    Modified Rankin Scale   I am certified in mRS: Yes  mRS Assessment Source: Patient  mRS Value: 1 - No significant  disability    Therapist PPE during session procedural mask and gloves      Signature:   Jearld Lesch Aristeo Hankerson, OTD, OTR/L  09/17/2022  9:20 AM    (For scheduling questions, please contact rehab tech [phone])      Attention MD:   Thank you for allowing [place] to participate in the care of [facility]. Regulations from the Center for Medicare and Medicaid Services (CMS) require your review and approval of this plan of care.     Please cosign this note indicating you are in agreement with the Therapy Plan of Care so we may initiate the therapy treatment plan.

## 2022-09-17 NOTE — PT Eval Note (Signed)
Physical Therapy Evaluation  Deedra Ehrich Lodwick      Post Acute Care Therapy Recommendations:     Discharge Recommendations:  Home with home health PT      DME needs IF patient is discharging home: No additional equipment/DME recommended at this time    Therapy discharge recommendations may change with patient status.  Please refer to most recent note for up-to-date recommendations.           [facility]  [address]  [place], [place] [address]  [phone]    Physical Therapy Evaluation    Patient: Zyriah Mask Rico MRN: [id number]   Unit: [facility] Bed: E 31/E31    Time of Treatment:   PT Received On: 09/17/22  Start Time: 0840  Stop Time: 0859  Time Calculation (min): 19 min       Consult received for Deedra Ehrich Belmonte for PT evaluation and treatment.  Patient's medical condition is appropriate for Physical Therapy  intervention at this time.    Interpreter utilized: no, not indicated      Assessment   Lei Dower is a 76 y.o. female admitted 09/16/2022 L sided numbess and dizziness. Brain MRI negative for acute process. Pt has a PMHx of HTN, HLD, breast CA 2014, 2017, hypothyroidism, GERD, LBP, dizziness. At baseline, pt resides alone in an apartment with 6 + 6 STE. She does not require use of DME.   Upon evaluation, pt presents with decreased strength of LLE (possibly effort limited) and reports decreased sensation to LLE. Pt completed independent mobility including ambulation without device. No gait deviations noted to indicate LLE weakness. Pt is cleared from an acute care perspective but could benefit from HHPT to ensure home safety.       Pt's functional mobility is impacted by:  sensation deficits and decreased strength.  There are a few comorbidities or other factors that affect plan of care and require modification of task including: has stairs to manage and lives alone.  Standardized tests and exams incorporated into evaluation include AMPAC mobility,  ROM , and Strength.  Pt demonstrates a stable clinical presentation.   Pt would continue to benefit from PT to address these deficits and increase functional independence.     Complexity Level Hx and Co  morbidites Examination Clinical Decision Making Clinical Presentation   Low  no impact 1-2 elements Limited options Stable       PMP - Progressive Mobility Protocol   PMP Activity: Step 7 - Walks out of Room  Distance Walked (ft) (Step 6,7): 10 Feet       Interdisciplinary Communication: RN, OT      Plan     Plan  Risks/Benefits/POC Discussed with Pt/Family: With patient  Treatment/Interventions: No skilled interventions needed at this time  PT Frequency: one time visit - therapy discontinued         Medical Diagnosis: Left sided numbness [R20.0]    History of Present Illness: Emiley Digiacomo Burningham is a 76 y.o. female admitted on  09/16/2022 with "PMH HTN, HLD, breast CA 2014, 2017, hypothyroidism, GERD, LBP, dizziness presenting with LUE and LLE weakness. Pt reports this started around 2 pm today. Also notes when she stands she feels her left leg is going to go out from under her. Also c/o dizziness today. Pt had admission here for dizziness work up with neg stroke work up and was discharged with meclizine. Pt did not try meclizine for the dizziness. Attempted to review medic  with pt but she is unclear on many of them and what she is taking. Unsure if this is a new finding. Pt does live alone but daughter lives nearby. Denies vision changes/diplopia. Denies speech diffic. Denies H/A.   Denies CP, SOB, cough, F/C, abd pain, urinary symptoms." Per MD Tempesta on 09/16/22        Patient Active Problem List   Diagnosis    Malignant neoplasm of overlapping sites of right female breast    Hypertension    Mixed hyperlipidemia    Hypothyroid    Left-sided weakness    Right sided numbness    COVID-19 ruled out by laboratory testing    Abnormal CT scan, gastrointestinal tract    History of hypothyroidism    History of breast  cancer    History of colonic polyps    Dizziness    Left sided numbness       Past Medical History:   Diagnosis Date    Breast cancer 2014    right breast mastectomy    Exercise-induced angina     negative work up    GERD (gastroesophageal reflux disease)     Headache     resolved    Hyperlipidemia     Hypertension     Hypothyroidism     Low back pain     Malignant neoplasm of overlapping sites of right female breast 10/19/2015    Syncope and collapse     Chronic Vertigo    Vertigo        Past Surgical History:   Procedure Laterality Date    COLONOSCOPY  2021    COLONOSCOPY, POLYPECTOMY N/A 07/21/2020    Procedure: COLONOSCOPY, POLYPECTOMY;  Surgeon: Einar Grad, MD;  Location: [facility];  Service: Gastroenterology;  Laterality: N/A;    EGD, BIOPSY N/A 07/21/2020    Procedure: EGD, BIOPSY;  Surgeon: Einar Grad, MD;  Location: [facility];  Service: Gastroenterology;  Laterality: N/A;    EXPLORATORY LAPAROTOMY N/A 05/22/2021    Procedure: EXPLORATORY LAPAROTOMY;  Surgeon: Delena Serve, MD;  [facility]: [facility];  Service: General;  Laterality: N/A;    HYSTERECTOMY      LAPAROSCOPY, DIAGNOSTIC N/A 05/22/2021    Procedure: LAPAROSCOPY, DIAGNOSTIC;  Surgeon: Delena Serve, MD;  Location: [facility];  Service: General;  Laterality: N/A;    LAPAROTOMY, COLECTOMY, RIGHT Right 05/22/2021    Procedure: LAPAROTOMY, COLECTOMY, RIGHT;  Surgeon: Delena Serve, MD;  Location: [facility];  Service: General;  Laterality: Right;    MASTECTOMY Right 2015       Tests/Labs:  Lab Results   Component Value Date/Time    HGB 10.6 (L) 09/16/2022 08:43 PM    HCT 34.8 09/16/2022 08:43 PM    K 4.0 09/16/2022 08:43 PM    NA 145 09/16/2022 08:43 PM    INR 0.9 09/16/2022 08:48 PM    TROPI <2.7 09/16/2022 10:59 PM    TROPI Unable toCalc. 09/16/2022 10:59 PM    TROPI <2.7 09/16/2022 08:43 PM    TROPI <2.7 06/30/2022 11:36 AM    TROPI <2.7 05/12/2022 10:17 AM    TROPI Unable toCalc. 05/12/2022 10:17 AM    TROPI  0.01 07/15/2009 09:40 AM         Imaging   MR Angiogram Head WO Contrast    Result Date: 09/17/2022  No evidence of an aneurysm, significant stenosis or major branch occlusion intracranially. Alric Seton MD, MD 09/17/2022 1:59  AM    MRI Brain W WO Contrast    Result Date: 09/17/2022   1. No MR evidence of acute process. 2. Stable chronic small vessel ischemic changes in white matter. Ivin Poot, MD 09/17/2022 1:57 AM    MR Angiogram Neck W WO Contrast    Result Date: 09/17/2022  No hemodynamically significant carotid or vertebral artery is stenosis, dissection or occlusion within the neck. Alric Seton MD, MD 09/17/2022 1:57 AM    CT Head WO Contrast    Result Date: 09/16/2022  No acute abnormality detected. Carleene Overlie, MD 09/16/2022 10:09 PM    Chest AP Portable    Result Date: 09/16/2022   No acute cardiopulmonary abnormality. Gustavus Messing, MD 09/16/2022 9:26 PM     Social History: (Per family/care provider, if patient is not able to give accurate report)  Lives alone in a apartment.  Inside steps: 6 down then 6 up  Rails: 1  Equipment at home:  rolling walker  Prior Level of Function:     Cognition: intact    Mobility/Locomotion: independent   Feeding: independent   Grooming: independent   Bathing: independent   Dressing: independent   Toileting: independent  Retired, drives  Subjective   "I have a headache"  Patient is agreeable to participation in the therapy session.  Patient's Goal:  return to independence"  Pain: 7/10  Location: head  Therapist Intervention: pain reduced after sitting up  Patient is satisfied with therapist intervention.    Objective     Precautions/ Contraindications:   Precautions  Weight Bearing Status: no restrictions  Restricted BP Precautions: no R UE  Other Precautions: falls, aspiration, seizure, skin    Patient is in bed with telemetry and intravenous (IV) in place.       Observation of patient/vitals:        Vitals:    09/17/22 0144 09/17/22 0408 09/17/22 0715 09/17/22 0836    BP: (!) 203/83 174/73 193/81 186/81   Pulse: 60 (!) 48 (!) 45    Resp:  16 15    Temp: 97.8 F (36.6 C) 97.8 F (36.6 C) 97.9 F (36.6 C)    TempSrc: Oral Oral Oral    SpO2: 99% 97% 97%    Weight: 68.8 kg (151 lb 10.8 oz)      Height: 1.6 m (5\' 3" )          Orientation/Cognition:  Alert and Oriented x 4  Cognition: intact      Musculoskeletal Examination:      ROM Strength   RLE WFL WFL   LLE WFL Reduced, possibly effort limited     Sensation: pt reports diminished sensation to LLE  Coordination: intact    Functional Mobility:   Supine to sit: independent  Sit to Supine: independent  Sit to stand: independent  Stand to sit: independent    Ambulation:     Weightbearing: no restrictions   Assistance level: independent   Distance: 136ft   Assistive Device: none   Gait Deviations: slow cadence   Stairs: mod I step ups with R railing x10 reps    Balance:  Static Sit: Good  Dynamic Sit: Good  Static Stand: Good  Dynamic Stand: Good    Endurance: Good    Participation:  Good    Education:  Educated the patient to role of physical therapy, plan of care, goals  of therapy, rationale for progressing mobility and safety with mobility and ADLs.    RN notified of session  outcome and that patient was left in bed with all needs met and equipment intact.   Safety measures include: handoff to nurse/clin tech/ unit secretary completed, oriented to call bell and placed within reach, personal items within reach, and bed placed in lowest position.   Mobility and ADL status posted at bedside and within E.M.R.    AM-PACT Inpatient Short Forms  Inpatient AM-PACT Performed? (PT): Basic Mobility Inpatient Short Form   AM-PACT "6 Clicks" Basic Mobility Inpatient Short Form  Turning Over in Bed: None  Sitting Down On/Standing From Armchair: None  Lying on Back to Sitting on Side of Bed: None  Assist Moving to/from Bed to Chair: None  Assist to Walk in Hospital Room: A little  Assist to Climb 3-5 Steps with Railing: A little  PT Basic  Mobility Raw Score: 22  CMS 0-100% Score: 20.91%              Modified Rankin Scale:      I am certified in mRS: Yes  mRS Assessment Source: Patient  mRS Value: 1 - No significant disability    Therapist PPE during session procedural mask and gloves     Signature:  Jeananne Rama, PT  09/17/2022  10:09 AM       (For scheduling questions, please contact rehab tech x [phone])     Attention MD:   Thank you for allowing [place] to participate in the care of [facility]. Regulations from the Center for Medicare and Medicaid Services (CMS) require your review and approval of this plan of care.     Please cosign this note indicating you are in agreement with the Therapy Plan of Care so we may initiate the therapy treatment plan.

## 2022-09-17 NOTE — Final Progress Note (DC Note for stay less than 48 (Signed)
DISCHARGE SUMMARY/PROGRESS NOTE  [facility], DIVISION OF HOSPITALIST MEDICINE    [facility]      Date Time: 09/17/22 2:23 PM  Patient Name: Emily [name]  Attending Physician: Alfred Levins, MD  Primary Care Physician: Rondel Baton, MD    Date of Admission: 09/16/2022  Date of Discharge: 09/17/2022    Outcome of Hospitalization:   Left-sided numbness/weakness  Dizziness  Right sided breast cancer 2014, 2017  Hypertension  Mixed hyperlipidemia  Hypothyroidism    Emily [name] is a 76 y.o. female PMH HTN, HLD, breast CA 2014, 2017, hypothyroidism, GERD, LBP, dizziness presenting with LUE and LLE weakness. Pt reports this started around 2 pm  yesterday. Also notes when she stands she feels her left leg is going to go out from under her. Also c/o dizziness as well. Pt had admission here for dizziness work up with neg stroke work up and was discharged with meclizine. Pt did not try meclizine for the dizziness. Attempted to review medic with pt but she is unclear on many of them and what she is taking. Unsure if this is a new finding. Pt does live alone but daughter lives nearby. Denies vision changes/diplopia. Denies speech diffic. Denies H/A.   Denies CP, SOB, cough, F/C, abd pain, urinary symptoms.  Patient was admitted for CVA rule out.  CT of the head and neck shows no acute abnormality.  Neuro was consulted patient was seen by Dr. Francesco Sor started on aspirin and statin.  And had a lipid panel and A1c done.  Lipid panel shows total cholesterol 248, HDL 74, LDL 158, triglyceride 80.  Patient had A1c of 5.7.  MRI of the brain shows no acute process stable chronic small vessel ischemic changes in white matter.  MRA head and neck shows no evidence of aneurysm or significant stenosis.  Patient was started on meclizine as needed for dizziness.  Patient's symptoms have improved, patient was seen by Dr. Francesco Sor again and is cleared from neurological standpoint to be discharged  home.    Her LDL is 158 despite crestor and zetia (which she claims to take everyday). She should be considered for PCSK-9 inhibitors at the cardiology office. I asked her to follow up with Dr. Celene Skeen as soon as possible.    Today:   BP 155/72   Pulse 63   Temp 98.3 F (36.8 C)   Resp 19   Ht 1.6 m (5\' 3" )   Wt 68.8 kg (151 lb 10.8 oz)   SpO2 97%   BMI 26.87 kg/m   Ranges for the last 24 hours:  Temp:  [97.8 F (36.6 C)-98.3 F (36.8 C)] 98.3 F (36.8 C)  Heart Rate:  [45-63] 63  Resp Rate:  [15-20] 19  BP: (155-222)/(72-105) 155/72  BP 155/72   Pulse 63   Temp 98.3 F (36.8 C)   Resp 19   Ht 1.6 m (5\' 3" )   Wt 68.8 kg (151 lb 10.8 oz)   SpO2 97%   BMI 26.87 kg/m   General appearance: alert, appears stated age, and cooperative  Lungs: clear to auscultation bilaterally  Heart: regular rate and rhythm, S1, S2 normal, no murmur, click, rub or gallop  Abdomen: soft, non-tender; bowel sounds normal; no masses,  no organomegaly  Extremities: extremities normal, atraumatic, no cyanosis or edema     Procedures performed:   Radiology: all results from this admission  MR Angiogram Head WO Contrast    Result Date: 09/17/2022  No evidence of an aneurysm, significant stenosis or major branch occlusion intracranially. Alric Seton MD, MD 09/17/2022 1:59 AM    MRI Brain W WO Contrast    Result Date: 09/17/2022   1. No MR evidence of acute process. 2. Stable chronic small vessel ischemic changes in white matter. Ivin Poot, MD 09/17/2022 1:57 AM    MR Angiogram Neck W WO Contrast    Result Date: 09/17/2022  No hemodynamically significant carotid or vertebral artery is stenosis, dissection or occlusion within the neck. Alric Seton MD, MD 09/17/2022 1:57 AM    CT Head WO Contrast    Result Date: 09/16/2022  No acute abnormality detected. Carleene Overlie, MD 09/16/2022 10:09 PM    Chest AP Portable    Result Date: 09/16/2022   No acute cardiopulmonary abnormality. Gustavus Messing, MD 09/16/2022 9:26 PM    Surgery: all results from this admission  * No surgery found *  Lab Results last 48 Hours       Procedure Component Value Units Date/Time    Lipid panel [[id number]] Collected: 09/17/22 1348    Specimen: Blood Updated: 09/17/22 1352    Narrative:      Fasting specimen    Phosphorus [[id number]] Collected: 09/17/22 1348    Specimen: Blood Updated: 09/17/22 1352    Vitamin B6 [[id number]] Collected: 09/17/22 1348    Specimen: Blood Updated: 09/17/22 1352    Narrative:      Protect from light    Vitamin B12 [[id number]] Collected: 09/17/22 1348    Specimen: Blood Updated: 09/17/22 1352    Whole Blood Vitamin B1 (Thiamine) [[id number]] Collected: 09/17/22 1348    Specimen: Blood Updated: 09/17/22 1352    Narrative:      Transfer whole blood to plastic shipping vial. Wrap tube in aluminum foil to  protect from light. Freeze immediatley.    Urinalysis Reflex to Microscopic Exam- Reflex to Culture [[id number]] Collected: 09/17/22 1348     Updated: 09/17/22 1352    High Sensitivity Troponin-I at 0 hrs [[id number]] Collected: 09/17/22 1348    Specimen: Blood Updated: 09/17/22 1352    Hemoglobin A1C [[id number]] Collected: 09/17/22 1348    Specimen: Blood Updated: 09/17/22 1352    Basic Metabolic Panel [[id number]] Collected: 09/17/22 1348    Specimen: Blood Updated: 09/17/22 1352    CBC and differential [[id number]] Collected: 09/17/22 1348    Specimen: Blood Updated: 09/17/22 1352    Magnesium [[id number]] Collected: 09/17/22 1348    Specimen: Blood Updated: 09/17/22 1352    Lipid panel [[id number]]  (Abnormal) Collected: 09/16/22 2043    Specimen: Blood Updated: 09/17/22 0800     Cholesterol 248 mg/dL      Triglycerides 80 mg/dL      HDL 74 mg/dL      LDL Calculated 106 mg/dL      VLDL Calculated 16 mg/dL      Cholesterol / HDL Ratio 3.4 Index     Hemoglobin A1C [[id number]]  (Abnormal) Collected: 09/16/22 2043     Updated: 09/17/22 0748     Hemoglobin A1C 5.7 %      Average Estimated Glucose 116.9 mg/dL     High Sensitivity  Troponin-I at 2 hrs with calculated Delta [[id number]] Collected: 09/16/22 2259    Specimen: Blood Updated: 09/16/22 2328     hs Troponin-I <2.7 ng/L      hs Troponin-I Delta Unable toCalc. ng/L     High Sensitivity Troponin-I [[id number]] Collected: 09/16/22 2043    Specimen: Blood Updated: 09/16/22 2124  hs Troponin-I <2.7 ng/L     Comprehensive metabolic panel [[id number]]  (Abnormal) Collected: 09/16/22 2043    Specimen: Blood Updated: 09/16/22 2121     Glucose 92 mg/dL      BUN 40.9 mg/dL      Creatinine 1.1 mg/dL      Sodium 811 mEq/L      Potassium 4.0 mEq/L      Chloride 108 mEq/L      CO2 30 mEq/L      Calcium 9.2 mg/dL      Protein, Total 6.8 g/dL      Albumin 3.9 g/dL      AST (SGOT) 20 U/L      ALT 11 U/L      Alkaline Phosphatase 79 U/L      Bilirubin, Total 0.2 mg/dL      Globulin 2.9 g/dL      Albumin/Globulin Ratio 1.3     Anion Gap 7.0     eGFR 51.7 mL/min/1.73 m2     Prothrombin time/INR [[id number]] Collected: 09/16/22 2048    Specimen: Blood Updated: 09/16/22 2108     PT 11.0 sec      PT INR 0.9    APTT [[id number]] Collected: 09/16/22 2048     Updated: 09/16/22 2108     PTT 35 sec     CBC and differential [[id number]]  (Abnormal) Collected: 09/16/22 2043    Specimen: Blood Updated: 09/16/22 2100     WBC 6.30 x10 3/uL      Hgb 10.6 g/dL      Hematocrit 41.3 %      Platelets 313 x10 3/uL      RBC 3.87 x10 6/uL      MCV 89.9 fL      MCH 27.4 pg      MCHC 30.5 g/dL      RDW 14 %      MPV 11.4 fL      Instrument Absolute Neutrophil Count 3.14 x10 3/uL      Neutrophils 49.8 %      Lymphocytes Automated 39.5 %      Monocytes 7.8 %      Eosinophils Automated 2.2 %      Basophils Automated 0.5 %      Immature Granulocytes 0.2 %      Nucleated RBC 0.0 /100 WBC      Neutrophils Absolute 3.14 x10 3/uL      Lymphocytes Absolute Automated 2.49 x10 3/uL      Monocytes Absolute Automated 0.49 x10 3/uL      Eosinophils Absolute Automated 0.14 x10 3/uL      Basophils Absolute Automated 0.03 x10 3/uL      Immature  Granulocytes Absolute 0.01 x10 3/uL      Absolute NRBC 0.00 x10 3/uL     Glucose Whole Blood - POCT [[id number]]  (Abnormal) Collected: 09/16/22 2021     Updated: 09/16/22 2031     Whole Blood Glucose POCT 105 mg/dL             Treatment Team:   Attending Provider: Alfred Levins, MD  Consulting Physician: Cathe Mons, MD  Consulting Physician: Alfred Levins, MD    Disposition:   Disposition: home with family    Condition at Discharge:   stable     Follow up Recommendations for Receiving Provider   Diet- Diet heart healthy    Continue with all your home medications  Follow-up with your primary doctor within 1 week of discharge  from the hospital  Return to the emergency room if your symptoms worsen  Prescriptions for Crestor and  Zetia was sent to your pharmacy  Follow-up with your cardiologist to discuss PCSK9 inhibitor for lowering LDL.                Unresulted Labs       Procedure . . . Date/Time    Lipid panel [id number] Collected: 09/17/22 1348    Specimen: Blood Updated: 09/17/22 1352    Narrative:      Fasting specimen    Phosphorus [[id number]] Collected: 09/17/22 1348    Specimen: Blood Updated: 09/17/22 1352    Vitamin B6 [[id number]] Collected: 09/17/22 1348    Specimen: Blood Updated: 09/17/22 1352    Narrative:      Protect from light    Vitamin B12 [[id number]] Collected: 09/17/22 1348    Specimen: Blood Updated: 09/17/22 1352    Whole Blood Vitamin B1 (Thiamine) [[id number]] Collected: 09/17/22 1348    Specimen: Blood Updated: 09/17/22 1352    Narrative:      Transfer whole blood to plastic shipping vial. Wrap tube in aluminum foil to  protect from light. Freeze immediatley.    Urinalysis Reflex to Microscopic Exam- Reflex to Culture [[id number]] Collected: 09/17/22 1348     Updated: 09/17/22 1352    High Sensitivity Troponin-I at 0 hrs [[id number]] Collected: 09/17/22 1348    Specimen: Blood Updated: 09/17/22 1352    Hemoglobin A1C [[id number]] Collected: 09/17/22 1348    Specimen: Blood Updated:  09/17/22 1352    Basic Metabolic Panel [[id number]] Collected: 09/17/22 1348    Specimen: Blood Updated: 09/17/22 1352    CBC and differential [[id number]] Collected: 09/17/22 1348    Specimen: Blood Updated: 09/17/22 1352    Magnesium [[id number]] Collected: 09/17/22 1348    Specimen: Blood Updated: 09/17/22 1352            Discharge Instructions:        Medication List        ASK your doctor about these medications      amLODIPine 10 MG tablet  Commonly known as: NORVASC  Take 1 tablet (10 mg total) by mouth daily     anastrozole 1 MG tablet  Commonly known as: ARIMIDEX  Take 1 tablet (1 mg total) by mouth every morning     aspirin 81 MG chewable tablet  Chew 1 tablet (81 mg total) by mouth daily     celecoxib 200 MG capsule  Commonly known as: CeleBREX     diclofenac Sodium 1 % Gel topical gel  Commonly known as: VOLTAREN  Apply 2 g topically 4 (four) times daily     ezetimibe 10 MG tablet  Commonly known as: ZETIA  Take 1 tablet (10 mg) by mouth daily     furosemide 10 MG/ML solution  Commonly known as: LASIX     gabapentin 100 MG capsule  Commonly known as: NEURONTIN  Take 1 capsule (100 mg total) by mouth 2 (two) times daily as needed (Neuropathy)     IRON PO     levothyroxine 25 MCG tablet  Commonly known as: SYNTHROID  Take 1 tablet (25 mcg total) by mouth every morning     lisinopril 40 MG tablet  Commonly known as: ZESTRIL  Take 1 tablet (40 mg total) by mouth daily     meclizine 25 MG tablet  Commonly known as: ANTIVERT  Take 1 tablet (25 mg) by mouth 3 (three) times daily as needed  for Dizziness     multivitamin with minerals tablet     pantoprazole 40 MG tablet  Commonly known as: PROTONIX     rosuvastatin 40 MG tablet  Commonly known as: CRESTOR  Take 1 tablet (40 mg) by mouth daily            Signed by: Alvy Beal, DNP FNP       I have examined this patient and have reviewed the notes, assessments and/or procedures performed by [facility]. I concur with her/his documentation of this patient--with  these modifications made directly to the note.      Alfred Levins, MD  Hospitalist

## 2022-09-17 NOTE — UM Notes (Signed)
Utilization Review - Initial/Admission Review   Patient Name Emily [name], Emily [name]   Patient Date of Birth Sep 27, 1946   Admission Class Observation   Admission Order 09/16/22 2339   Admit to Expedited Observation  Once        Diagnosis: Left Sided Numbness   Level of Care: Acute   Patient Class: Observation    Question Answer Comment   Admitting Physician Henderson Newcomer    Estimated Length of Stay < 2 midnights    Tentative Discharge Plan? Home or Self Care              Primary Coverage Listed Primary Coverage (Medicare [employer])  Authorization number not found.  Subscriber number: [id number]    Secondary Coverage Listed (If Applicable) This patient's hospital account has no active coverages at the specified level.        Reason for Hospitalization The patient is a(n) 76 y.o. female who arrived to the hospital with complaint of Numbness and Generalized weakness    76 y.o. female with past medical history as below presents emergency department after developing left arm and leg numbness 6 hours prior to arrival.  No gross weakness.  Patient did have some lightheadedness with standing    Past Medical History Diagnosis    Breast cancer    right breast mastectomy    Exercise-induced angina    negative work up    GERD (gastroesophageal reflux disease)    Headache    resolved    Hyperlipidemia    Hypertension    Hypothyroidism    Low back pain    Malignant neoplasm of overlapping sites of right female breast    Syncope and collapse    Chronic Vertigo    Vertigo      Admission Vitals    BP 09/16/22 2014 (!) 210/86      Heart Rate 09/16/22 2014 (!) 59      Resp Rate 09/16/22 2014 20      Temp 09/16/22 2014 98.2 F (36.8 C)      Temp Source 09/16/22 2354 Oral      SpO2 09/16/22 2014 98 %      Weight 09/16/22 2014 69.1 kg (152 lb 5.4 oz)      Height 09/16/22 2014 1.626 m (5\' 4" )      Pain Score 09/16/22 2014 5      EKG (If Applicable) Last EKG Result       Procedure Component Value Units Date/Time    ECG 12 lead [[id number]]  Collected: 09/16/22 2028     Updated: 09/16/22 2028     Ventricular Rate 56 BPM      Atrial Rate 56 BPM      P-R Interval 184 ms      QRS Duration 86 ms      Q-T Interval 434 ms      QTC Calculation (Bezet) 418 ms      P Axis 54 degrees      R Axis 41 degrees      T Axis 50 degrees      IHS MUSE NARRATIVE AND IMPRESSION --     SINUS BRADYCARDIA  NONSPECIFIC T WAVE ABNORMALITY  ABNORMAL ECG  WHEN COMPARED WITH ECG OF 30-Jun-2022 11:42,  PR INTERVAL HAS DECREASED  NONSPECIFIC T WAVE ABNORMALITY NOW EVIDENT IN LATERAL LEADS      Narrative:      SINUS BRADYCARDIA  NONSPECIFIC T WAVE ABNORMALITY  ABNORMAL ECG  WHEN COMPARED WITH ECG OF 30-Jun-2022 11:42,  PR INTERVAL HAS DECREASED  NONSPECIFIC T WAVE ABNORMALITY NOW EVIDENT IN LATERAL LEADS      Radiology Results (If Applicable) CT Head WO Contrast  No acute abnormality detected.     Chest AP Portable  No acute cardiopulmonary abnormality.    Lab Results (If Applicable)  09/16/22 20:21 09/16/22 20:43   Hemoglobin  10.6 (L)   RBC  3.87 (L)   MCHC  30.5 (L)   Whole Blood Glucose POCT 105 (H)    Creatinine  1.1 (H)   CO2  30 (H)   eGFR  51.7 !   Cholesterol  248 (H)   LDL Calculated  158 (H)   Hemoglobin A1C  5.7 (H)   (L): Data is abnormally low  (H): Data is abnormally high  !: Data is abnormal   Admission Medications Medications   amLODIPine (NORVASC) tablet 10 mg (10 mg Oral Given 09/17/22 0837)   anastrozole (ARIMIDEX) tablet 1 mg (1 mg Oral Given 09/17/22 0837)   aspirin chewable tablet 81 mg (81 mg Oral Given 09/17/22 0836)   ezetimibe (ZETIA) tablet 10 mg (10 mg Oral Given 09/17/22 0840)   levothyroxine (SYNTHROID) tablet 25 mcg (25 mcg Oral Given 09/17/22 0625)   pantoprazole (PROTONIX) EC tablet 40 mg (40 mg Oral Given 09/17/22 0838)   enoxaparin (LOVENOX) syringe 40 mg (40 mg Subcutaneous Given 09/17/22 0838)   lisinopril (ZESTRIL) tablet 40 mg (40 mg Oral Given 09/17/22 0836)   ketorolac (TORADOL) injection 15 mg (15 mg Intravenous Given 09/16/22 2220)    metoclopramide (REGLAN) injection 5 mg (5 mg Intravenous Given 09/16/22 2220)   diphenhydrAMINE (BENADRYL) injection 25 mg (25 mg Intravenous Given 09/16/22 2220)   gadoterate meglumine (CLARISCAN) 7.5 MMOL/15ML injection 15 mL (15 mLs Intravenous Imaging Agent Given 09/17/22 0108)          09/17/2022  Hospital Day: 2 CURRENT LOCATION: [facility] DEPT    VS RANGES - LAST 24 H: [RANGE] CURRENT VS  Temp:  [97.8 F (36.6 C)-98.3 F (36.8 C)] 98.3 F (36.8 C)  Heart Rate:  [45-63] 63  Resp Rate:  [15-20] 19  BP: (155-222)/(72-105) 155/72   LABS (If Applicable - Abnormal Only):    Not Available/Resulted at Time of Review Completion     Imaging  []  Not Available/Resulted at Time of Review Completion  MR Angiogram Head WO Contrast  No evidence of an aneurysm, significant stenosis or major branch occlusion intracranially.     MRI Brain W WO Contrast  No MR evidence of acute process.   Stable chronic small vessel ischemic changes in white matter.    MR Angiogram Neck W WO Contrast  No hemodynamically significant carotid or vertebral artery is stenosis, dissection or occlusion within the neck.   Continued Plan of Care/Treatment Plan/Consults []  Not Available/Resulted at Time of Review Completion  #Left sided weakness  #Left sided numbness  #?confusion, unclear if this is new   r/o CVA  - admit Obs  - NCHCT: no acute abnormality  - Consult Neuro in am - Dr. Francesco Sor  - initiate stroke pathway with Q 4 hr neuro checks  - on asa, statin  - check new A1C and lipid in am  - A1C 01/2022: 5.4  - Lipids 05/2022: chol 265;HDL 72; LDL 181; TGL 61  - pt recently had ezetimibe added to medic regimen by cardiology, Dr. Celene Skeen. Pt was already on rosuvastatin and pt states she has been compliant but last dispense of it was a 30 day  supply in Aug. Pt now unclear if she is taking  - Permissive HTN  - LKW 2 pm Fri; OOW for TNK  - monitor for dysrhythmias on tele  - 12 lead shows SB  - PT/OT consults  - passed bedside swallow  - MRI  brain: No acute process; stable chronic small vessel ischemic changes and white matter  - MRA head/neck: No evidence of aneurysm or significant stenosis or major branch occlusion intracranially; no hemodynamically significant carotid or vertebral artery stenosis, dissection, or occlusion within the neck  - meclizine prn dizziness  - nutritional markers pending     #HTN  #HLD  - cont home amlodipine, lisinopril  - cont rosuvastatin, ezetimibe     #breast cancer  - cont home anastrozole     #Hypothyroidism  - cont home levothyroxine     #GERD  - pantoprazole as subst for non-formulary omeprazole      Neuro Consulted  Episode of left-sided numbness still ongoing in the setting of negative brain MRI not sure what to make of this given patient has had multiple such admissions in the past?  Some form of functional disorder  Remain on aspirin  Address hyperlipidemia  Given MRI MRA studies negative no further neurowork-up required   Current Medications Current Facility-Administered Medications   Medication Dose Route Frequency    amLODIPine  10 mg Oral Daily    anastrozole  1 mg Oral QAM    aspirin  81 mg Oral Daily    enoxaparin  40 mg Subcutaneous Daily    ezetimibe  10 mg Oral Daily    levothyroxine  25 mcg Oral QAM    lisinopril  40 mg Oral Daily    pantoprazole  40 mg Oral QAM AC    rosuvastatin  40 mg Oral QHS     Current Facility-Administered Medications   Medication Dose Route    acetaminophen  650 mg Oral    artificial tears (REFRESH PLUS)  1 drop Both Eyes    dextrose  15 g of glucose Oral    Or    dextrose  12.5 g Intravenous    Or    dextrose  12.5 g Intravenous    Or    glucagon (rDNA)  1 mg Intramuscular    gabapentin  100 mg Oral    hydrALAZINE  10 mg Intravenous    labetalol  10 mg Intravenous    meclizine  25 mg Oral    melatonin  3 mg Oral    naloxone  0.2 mg Intravenous    saline  2 spray Each Nare          Utilization Review Contact Janeece Agee, RN, BSN  Utilization Review      Finance - [employer]    [address]  [address]  [place], [place] [address]  [phone]  [email or web address] [facility]  [address] [place], [place] [address]  NPI: [id number]     [facility]  [address] [place], [place] [address]  NPI: [id number]     [facility]  [address] [place], [place] [address]  NPI: [id number]     [facility]  [address] [place], [place] [address]  NPI: [id number]     [facility]  [address] [place], [place] [address]  NPI: [id number]     Centralized Fax -- All Facilities  For Authorizations/Inquiries/Requests/Denials  [phone]   **This clinical  review is based on/compiled from documentation provided by the treatment team within the patient's medical record.**    This communication may contain confidential and/or privileged information. Additionally, this communication may contain protected health information (PHI) that is legally protected from inappropriate disclosure by the Privacy Standards of the [employer] Insurance Portability and Accountability Act (HIPAA) and relevant [place] Laws. If you are not the intended recipient, please note that any dissemination, distribution or copying of this communication is strictly prohibited. If you have received this message in error, you should notify the sender immediately by telephone or by return e-mail and delete this message from your computer. Direct questions to the Personal assistantChief Privacy Officer at [phone].

## 2022-09-17 NOTE — Discharge Instr - AVS First Page (Addendum)
Continue with all your home medications  Follow-up with your primary doctor within 1 week of discharge from the hospital  Return to the emergency room if your symptoms worsen  Prescriptions for Crestor and  Zetia was sent to your pharmacy  Follow-up with your cardiologist Dr. Celene Skeen to discuss PCSK9 inhibitor for lowering LDL.

## 2022-09-17 NOTE — SLP Eval Note (Signed)
Attention MD:   Thank you for allowing [place] to participate in the care of [facility]. Regulations from the Center for Medicare and Medicaid Services (CMS) require your review and approval of this plan of care.     Please cosign this note indicating you are in agreement with the Therapy Plan of Care so we may initiate the therapy treatment plan.    [facility]  [address]  [place], [place] [address]  [phone]    SPEECH LANGUAGE COGNITIVE EVALUATION    Patient: Emily [name]    MRN#: [id number]    Date/Time of Evaluation:   SLP Received On: 09/17/22  Start Time: 0803  Stop Time: 0822  Time Calculation (min): 19 min    Consult received for Emily [name] for SLP Evaluation and Treatment.    Referring Physician:     Shela Nevin, MD       Date of Referral: 09/16/22    Interpreter utilized: no, not indicated    Therapist PPE during session gloves     Assessment and Clinical Impression   Emily [name] is a 76 y.o. female admitted 09/16/2022 for  Left sided numbness [R20.0] presenting with LUE and LLE weakness. Consult received as part of stroke rule out pathway. PMHx significant for GERD, HTN, LBP, hypothyroidism and breast CA 2014, 2017. Per RN, no concerns at this time. MRI clear for acute infarct.    Upon interview, pt denies changes in swallowing. Currently tolerating regular/thin diet without difficulty. Denies hx of dysphagia. Pt denies changes in cognitive communication skills or receptive language skills. Engaged in conversation with SLP appropriately following commands.     Pt endorsed changes in expressive language skills over the past few days. Stated she has been having some difficulty finding her words. Completed modified WAB assessment to assess expressive language. Pt with fluent speech speaking in grammatically correct complete sentences 8-12 words in length during picture description. No difficulty naming common objects around room. No moments of word  finding difficulty during conversation with clinician.    Given presentation today and negative stroke workup pt does not appear to require acute SLP intervention at this time. Recommend continuing current diet with d/c from SLP service. Please re-consult if pt has change in status or further concerns arise.     Plan    No acute SLP intervention indicated at this time. Will d/c from SLP service.    Goals: Pt will participate in language evaluation (met today, 09/17/22)    Rehabilitation Prognosis: excellent due to participation and PLOF      History     Prior Speech Therapy Intervention: Seen at [facility] April of this year without need for SLP intervention.    Social History: obtained by pt at bedside  Baseline Diet: regular thin  Feeding: independent  Hearing: WFL  Vision:wear glasses  Cognition: WFL    Medical Diagnosis: Left sided numbness [R20.0]    History of Present Illness: Emily [name] is a 76 y.o. female admitted on 09/16/2022 with   Patient Active Problem List   Diagnosis    Malignant neoplasm of overlapping sites of right female breast    Hypertension    Mixed hyperlipidemia    Hypothyroid    Left-sided weakness    Right sided numbness    COVID-19 ruled out by laboratory testing    Abnormal CT scan, gastrointestinal tract    History of hypothyroidism    History of breast cancer  History of colonic polyps    Dizziness    Left sided numbness        Past Medical/Surgical History  Past Medical History:   Diagnosis Date    Breast cancer 2014    right breast mastectomy    Exercise-induced angina     negative work up    GERD (gastroesophageal reflux disease)     Headache     resolved    Hyperlipidemia     Hypertension     Hypothyroidism     Low back pain     Malignant neoplasm of overlapping sites of right female breast 10/19/2015    Syncope and collapse     Chronic Vertigo    Vertigo       Past Surgical History:   Procedure Laterality Date    COLONOSCOPY  2021    COLONOSCOPY, POLYPECTOMY N/A 07/21/2020     Procedure: COLONOSCOPY, POLYPECTOMY;  Surgeon: Einar Grad, MD;  Location: [facility];  Service: Gastroenterology;  Laterality: N/A;    EGD, BIOPSY N/A 07/21/2020    Procedure: EGD, BIOPSY;  Surgeon: Einar Grad, MD;  Location: [facility];  Service: Gastroenterology;  Laterality: N/A;    EXPLORATORY LAPAROTOMY N/A 05/22/2021    Procedure: EXPLORATORY LAPAROTOMY;  Surgeon: Delena Serve, MD;  Location: [facility];  Service: General;  Laterality: N/A;    HYSTERECTOMY      LAPAROSCOPY, DIAGNOSTIC N/A 05/22/2021    Procedure: LAPAROSCOPY, DIAGNOSTIC;  Surgeon: Delena Serve, MD;  Location: [facility];  Service: General;  Laterality: N/A;    LAPAROTOMY, COLECTOMY, RIGHT Right 05/22/2021    Procedure: LAPAROTOMY, COLECTOMY, RIGHT;  Surgeon: Delena Serve, MD;  Location: [facility];  Service: General;  Laterality: Right;    MASTECTOMY Right 2015             Subjective   Patient is agreeable to participation in the therapy session. Nursing clears patient for therapy. Patient's medical condition is  appropriate for speech therapy intervention at this time.  Patient's goal:  go home  Pain: no evidence of pain noted during session     Objective     Language Comprehension  Auditory Comprehension: Within Functional Limits           Following Commands: Follows all commands and directions without difficulty    Language Expression  Primary Mode of Expression    Single Words: WFL  Connected Speech: WFL          Cognitive Linguistic Skills  Arousal/Alertness: Appropriate responses to stimuli  Attention Span: Appears intact  Orientation Level: Oriented X4  Memory: Appears intact     Insights: Fully aware of deficits     Dysphagia Screen Part 1 Score: 0  Dysphagia Screen Part 2 Score: 0    Formal Testing  Western Aphasia Battery-Bedside (WAB-R Bedside)   Pt was evaluated with the Western Aphasia Battery-Bedside (WAB-B). This assessment is designed to evaluate a patient's language function following  a stroke, dementia, or other acquired neurological disorder. Pt demonstrated moderate-severe deficits in the areas of receptive and expressive language.     Spontaneous Speech Content: 10/10  Spontaneous Speech Fluency: 10/10  Repetition: 10/10  Object Naming: 10/10    Signature  Ok Anis McClusky, SLP   09/17/2022    (For scheduling questions, please contact rehab tech x [phone])

## 2022-09-17 NOTE — H&P (Incomplete)
ADMISSION HISTORY AND PHYSICAL EXAM    [facility], DIVISION OF HOSPITALIST MEDICINE   [facility]   [place] Pager: [phone]      Date Time: 09/17/22 12:24 AM  Patient Name: Emily [name]  Attending Physician: Henderson Newcomer, MD  Primary Care Physician: Rondel Baton, MD    CC: ***  History Gathered From: {HISTORNIAN:24685}    Assessment:     Active Hospital Problems    Diagnosis   . Left sided numbness       Emily [name] is a 76 y.o. female with a PMHx of ***     Plan:   -Admit to [facility] Hospitalist service  ***    ***    {Vanishing Tip  Patient meets BMI criteria for a weight-based diagnosis. Patient has high (BMI=Body mass index is 26.15 kg/m.)      Overweight = BMI 25-29.9 kg/m^2     Obesity = BMI 30-39.9 kg/m^2     Obesity Class 3 (formerly known as Morbid Obesity) = BMI 40+ kg/m^2  reference. :02725366}  Patient has BMI=Body mass index is 26.15 kg/m.  Diagnosis: {BMI High:57938} based on BMI criteria         Nutrition:Diet NPO effective now  VTE Prophylaxis-Medication VTE Prophylaxis Orders: enoxaparin (LOVENOX) syringe 40 mg  Mechanical VTE Prophylaxis Orders: No active orders.  Add orders and refresh SmartLink. ***    Code status: {code status :53572}    Status/Disposition:   Pt is admitted under {INPATIENT VS OSB:24129} with above concerns.    Anticipated medical stability for discharge: {Anticipated YQI:34742}      History of Presenting Illnes   Emily [name] is a 76 y.o. female presenting with ***    Past Medical Histor     Past Medical History:   Diagnosis Date   . Breast cancer 2014    right breast mastectomy   . Exercise-induced angina     negative work up   . GERD (gastroesophageal reflux disease)    . Headache     resolved   . Hyperlipidemia    . Hypertension    . Hypothyroidism    . Low back pain    . Malignant neoplasm of overlapping sites of right female breast 10/19/2015   . Syncope and collapse     Chronic Vertigo   . Vertigo         ***  Available old records reviewed, including: EPIC     Past Surgical History:     Past Surgical History:   Procedure Laterality Date   . COLONOSCOPY  2021   . COLONOSCOPY, POLYPECTOMY N/A 07/21/2020    Procedure: COLONOSCOPY, POLYPECTOMY;  Surgeon: Einar Grad, MD;  Location: [facility];  Service: Gastroenterology;  Laterality: N/A;   . EGD, BIOPSY N/A 07/21/2020    Procedure: EGD, BIOPSY;  Surgeon: Einar Grad, MD;  Location: [facility];  Service: Gastroenterology;  Laterality: N/A;   . EXPLORATORY LAPAROTOMY N/A 05/22/2021    Procedure: EXPLORATORY LAPAROTOMY;  Surgeon: Delena Serve, MD;  Location: [facility];  Service: General;  Laterality: N/A;   . HYSTERECTOMY     . LAPAROSCOPY, DIAGNOSTIC N/A 05/22/2021    Procedure: LAPAROSCOPY, DIAGNOSTIC;  Surgeon: Delena Serve, MD;  Location: [facility];  Service: General;  Laterality: N/A;   . LAPAROTOMY, COLECTOMY, RIGHT Right 05/22/2021    Procedure: LAPAROTOMY, COLECTOMY, RIGHT;  Surgeon: Delena Serve, MD;  Location: [facility];  Service:  General;  Laterality: Right;   . MASTECTOMY Right 2015       Family History:     Family History   Problem Relation Age of Onset   . Breast cancer Neg Hx      ***  Social History:    reports that she has never smoked. She has never used smokeless tobacco. She reports current alcohol use. She reports that she does not use drugs.    Allergies:     Allergies   Allergen Reactions   . Morphine Itching   . Morphine And Related Itching and Nausea And Vomiting     dizzy       Medications:     Home Medications               amLODIPine (NORVASC) 10 MG tablet     Take 1 tablet (10 mg total) by mouth daily     anastrozole (ARIMIDEX) 1 MG tablet     Take 1 tablet (1 mg total) by mouth every morning     aspirin 81 MG chewable tablet     Chew 1 tablet (81 mg total) by mouth daily     celecoxib (CeleBREX) 200 MG capsule     Take 1 capsule (200 mg) by mouth daily     diclofenac Sodium (VOLTAREN) 1 % Gel  topical gel     Apply 2 g topically 4 (four) times daily     ezetimibe (ZETIA) 10 MG tablet     Take 1 tablet (10 mg) by mouth daily     Ferrous Sulfate (IRON PO)     Take by mouth     gabapentin (NEURONTIN) 100 MG capsule     Take 1 capsule (100 mg total) by mouth 2 (two) times daily as needed (Neuropathy)     levothyroxine (SYNTHROID) 25 MCG tablet     Take 1 tablet (25 mcg total) by mouth every morning     lisinopril (ZESTRIL) 40 MG tablet     Take 1 tablet (40 mg total) by mouth daily     meclizine (ANTIVERT) 25 MG tablet     Take 1 tablet (25 mg) by mouth 3 (three) times daily as needed for Dizziness     Multiple Vitamins-Minerals (MULTIVITAMIN WITH MINERALS) tablet     Take 1 tablet by mouth every morning     pantoprazole (PROTONIX) 40 MG tablet     Take 1 tablet (40 mg) by mouth daily     rosuvastatin (CRESTOR) 40 MG tablet     Take 1 tablet (40 mg) by mouth daily            Method by which medications were confirmed on admission: ***      Review of Systems:   All other systems were reviewed and are negative except:as above in HPI     Physical Exam:   Patient Vitals for the past 24 hrs:   BP Temp Temp src Pulse Resp SpO2 Height Weight   09/16/22 2354 (!) 192/94 97.9 F (36.6 C) Oral (!) 49 20 97 % -- --   09/16/22 2300 195/79 -- -- (!) 55 17 97 % -- --   09/16/22 2230 (!) 211/105 -- -- (!) 53 17 97 % -- --   09/16/22 2200 (!) 222/92 -- -- (!) 45 17 98 % -- --   09/16/22 2059 (!) 205/91 -- -- (!) 53 15 98 % -- --   09/16/22 2014 (!) 210/86 98.2 F (36.8 C) -- (!)  59 20 98 % 1.626 m (5\' 4" ) 69.1 kg (152 lb 5.4 oz)     Body mass index is 26.15 kg/m.  No intake or output data in the 24 hours ending 09/17/22 0024    General: WD female in no acute distress.  HEENT: perrla, eomi, sclera anicteric  oropharynx clear without lesions, mucous membranes moist  Neck: supple  Cardiovascular: Normal S1 and S2, no murmurs, rubs or gallops  Lungs: clear to auscultation bilaterally, without wheezing, rhonchi, or  rales  Abdomen: soft, non-tender, non-distended; normoactive bowel sounds, no rebound or guarding  Extremities: no edema , pulses palpable   Neuro: alert, oriented x 3, cranial nerves grossly intact, strength 5/5 in upper and lower extremities, sensation intact,   Skin: no rashes or lesions noted  Other: ***      Labs:     Results       Procedure Component Value Units Date/Time    Lipid panel [[id number]] Collected: 09/16/22 2043    Specimen: Blood Updated: 09/16/22 2345    Narrative:      Fasting specimen    High Sensitivity Troponin-I at 2 hrs with calculated Delta [[id number]] Collected: 09/16/22 2259    Specimen: Blood Updated: 09/16/22 2328     hs Troponin-I <2.7 ng/L      hs Troponin-I Delta Unable toCalc. ng/L     High Sensitivity Troponin-I [[id number]] Collected: 09/16/22 2043    Specimen: Blood Updated: 09/16/22 2124     hs Troponin-I <2.7 ng/L     Comprehensive metabolic panel [[id number]]  (Abnormal) Collected: 09/16/22 2043    Specimen: Blood Updated: 09/16/22 2121     Glucose 92 mg/dL      BUN 66.4 mg/dL      Creatinine 1.1 mg/dL      Sodium 403 mEq/L      Potassium 4.0 mEq/L      Chloride 108 mEq/L      CO2 30 mEq/L      Calcium 9.2 mg/dL      Protein, Total 6.8 g/dL      Albumin 3.9 g/dL      AST (SGOT) 20 U/L      ALT 11 U/L      Alkaline Phosphatase 79 U/L      Bilirubin, Total 0.2 mg/dL      Globulin 2.9 g/dL      Albumin/Globulin Ratio 1.3     Anion Gap 7.0     eGFR 51.7 mL/min/1.73 m2     Prothrombin time/INR [[id number]] Collected: 09/16/22 2048    Specimen: Blood Updated: 09/16/22 2108     PT 11.0 sec      PT INR 0.9    APTT [[id number]] Collected: 09/16/22 2048     Updated: 09/16/22 2108     PTT 35 sec     CBC and differential [[id number]]  (Abnormal) Collected: 09/16/22 2043    Specimen: Blood Updated: 09/16/22 2100     WBC 6.30 x10 3/uL      Hgb 10.6 g/dL      Hematocrit 63.0 %      Platelets 313 x10 3/uL      RBC 3.87 x10 6/uL      MCV 89.9 fL      MCH 27.4 pg      MCHC 30.5 g/dL      RDW 14 %       MPV 11.4 fL      Instrument Absolute Neutrophil Count 3.14 x10 3/uL  Neutrophils 49.8 %      Lymphocytes Automated 39.5 %      Monocytes 7.8 %      Eosinophils Automated 2.2 %      Basophils Automated 0.5 %      Immature Granulocytes 0.2 %      Nucleated RBC 0.0 /100 WBC      Neutrophils Absolute 3.14 x10 3/uL      Lymphocytes Absolute Automated 2.49 x10 3/uL      Monocytes Absolute Automated 0.49 x10 3/uL      Eosinophils Absolute Automated 0.14 x10 3/uL      Basophils Absolute Automated 0.03 x10 3/uL      Immature Granulocytes Absolute 0.01 x10 3/uL      Absolute NRBC 0.00 x10 3/uL     Glucose Whole Blood - POCT [[id number]]  (Abnormal) Collected: 09/16/22 2021     Updated: 09/16/22 2031     Whole Blood Glucose POCT 105 mg/dL              EKG: SB, NSTWA lateral leads    Imaging personally reviewed, including: all available   CT Head WO Contrast    Result Date: 09/16/2022  No acute abnormality detected. Carleene Overlie, MD 09/16/2022 10:09 PM    Chest AP Portable    Result Date: 09/16/2022   No acute cardiopulmonary abnormality. Gustavus Messing, MD 09/16/2022 9:26 PM         This note was generated by the Epic EMR system/ Dragon speech recognition and may contain inherent errors or omissions not intended by the user. Grammatical errors, random word insertions, deletions and pronoun errors  are occasional consequences of this technology due to software limitations. Not all errors are caught or corrected. If there are questions or concerns about the content of this note or information contained within the body of this dictation they should be addressed directly with the author for clarification.    Signed by: Alena Bills, NP  NF:[id number], Collie Siad, MD

## 2022-09-18 LAB — ECG 12-LEAD
Atrial Rate: 56 {beats}/min
QRS Duration: 86 ms
QTC Calculation (Bezet): 418 ms
R Axis: 41 degrees
T Axis: 50 degrees
Ventricular Rate: 56 {beats}/min

## 2022-09-21 ENCOUNTER — Encounter: Payer: Self-pay | Admitting: Hematology & Oncology

## 2022-09-22 LAB — VITAMIN B6: Vitamin B6: 40.6 ng/mL — ABNORMAL HIGH (ref 2.1–21.7)

## 2022-09-23 ENCOUNTER — Encounter: Payer: Self-pay | Admitting: Hematology & Oncology

## 2022-09-23 DIAGNOSIS — Z1231 Encounter for screening mammogram for malignant neoplasm of breast: Secondary | ICD-10-CM

## 2022-09-23 LAB — VITAMIN B1, WHOLE BLOOD: Whole Blood Vitamin B1: 136 nmol/L (ref 78–185)

## 2022-10-17 ENCOUNTER — Emergency Department
Admission: EM | Admit: 2022-10-17 | Discharge: 2022-10-17 | Disposition: A | Discharge status: Home / Self Care | Payer: Medicare Other | Attending: Emergency Medicine | Admitting: Emergency Medicine

## 2022-10-17 DIAGNOSIS — W19XXXA Unspecified fall, initial encounter: Secondary | ICD-10-CM | POA: Insufficient documentation

## 2022-10-17 DIAGNOSIS — R350 Frequency of micturition: Secondary | ICD-10-CM | POA: Insufficient documentation

## 2022-10-17 DIAGNOSIS — M5442 Lumbago with sciatica, left side: Secondary | ICD-10-CM | POA: Insufficient documentation

## 2022-10-17 LAB — URINALYSIS WITH REFLEX TO MICROSCOPIC EXAM - REFLEX TO CULTURE
Bilirubin, UA: NEGATIVE
Blood, UA: NEGATIVE
Glucose, UA: NEGATIVE
Ketones UA: NEGATIVE
Leukocyte Esterase, UA: NEGATIVE
Nitrite, UA: NEGATIVE
Protein, UR: NEGATIVE
Specific Gravity UA: 1.01 (ref 1.001–1.035)
Urine pH: 6 (ref 5.0–8.0)
Urobilinogen, UA: 0.2 mg/dL (ref 0.2–2.0)

## 2022-10-17 MED ORDER — ACETAMINOPHEN 500 MG PO TABS
1000.0000 mg | ORAL_TABLET | Freq: Once | ORAL | Status: AC
Start: 2022-10-17 — End: 2022-10-17
  Administered 2022-10-17: 1000 mg via ORAL
  Filled 2022-10-17: qty 2

## 2022-10-17 MED ORDER — LIDOCAINE 5 % EX PTCH
1.0000 | MEDICATED_PATCH | Freq: Once | CUTANEOUS | Status: DC
Start: 2022-10-17 — End: 2022-10-17
  Administered 2022-10-17: 1 via TRANSDERMAL
  Filled 2022-10-17: qty 1

## 2022-10-17 MED ORDER — KETOROLAC TROMETHAMINE 30 MG/ML IJ SOLN
15.0000 mg | Freq: Once | INTRAMUSCULAR | Status: AC
Start: 2022-10-17 — End: 2022-10-17
  Administered 2022-10-17: 15 mg via INTRAMUSCULAR
  Filled 2022-10-17: qty 1

## 2022-10-17 NOTE — EDIE (Signed)
[facility]?NOTIFICATION?10/17/2022 09:18?Emily, Posch [name]?MRN: [id number]    [facility]'s patient encounter information:   ZDG:[id number]  Account [id number]  Billing Account [id number]      Criteria Met      5 ED Visits in 12 Months    Security and Safety  No Security Events were found.  ED Care Guidelines  There are currently no ED Care Guidelines for this patient. Please check your facility's medical records system.        Prescription Monitoring Program  Narx Score not available at this time.    E.D. Visit Count (12 mo.)  Facility Visits   [facility] 9   Total 9   Note: Visits indicate total known visits.     Recent Emergency Department Visit Summary  Date Facility [facility] Type Diagnoses or Chief Complaint    Oct 17, 2022  [place].  [place]  [place]  Emergency      L leg pain      Sep 16, 2022  [place]  [place]  [place]  Emergency      Anesthesia of skin      Generalized weakness      Numbness      medic      Jun 30, 2022  [place].  [place]  [place]  Emergency      Dizziness and giddiness      Constipation, unspecified      Hemorrhage of anus and rectum      Rectal Bleeding      Hematuria      Headache      Abdominal Pain      Dizziness      MEDIC #[id number]      May 12, 2022  [place].  [place]  [place]  Emergency      Extremity Weakness      Dizziness      Dizziness, weakness, left side uneasy      Feb 15, 2022  [place].  [place]  [place]  Emergency      Chest pain, unspecified      Generalized abdominal pain      Weakness      Generalized Weakness      Jan 14, 2022  [place].  [place]  [place]  Emergency      Unspecified symptoms and signs involving the nervous system      Dizziness and giddiness      Unspecified visual loss      Cerebrovascular Accident      L side numbness      Nov 30, 2021  [place].  [place]  [place]  Emergency      Pulmonary fibrosis, unspecified      Dizziness and giddiness       Fatigue      Weakness; Fatigue      Nov 17, 2021  [place]  [place]    Emergency      Calculus of gallbladder without cholecystitis without obstruction      Unspecified abdominal pain      Dysuria      Abdominal Pain      Nausea      Scar Tissue Pain      Pain in right side, breast cancer patient      Nov 11, 2021  [place]  [place].  [place]  [place]  Emergency      Nontoxic single thyroid nodule      Paresthesia of skin      Chest pain, unspecified      Acute kidney failure, unspecified      Syncope and collapse      Dizziness and giddiness      Syncope      Chest Pain      chest pain,dizziness,weakness        Recent Inpatient Visit Summary  Date [place] Type Diagnoses or Chief Complaint    Jan 14, 2022  [place].  [place]  [place]  Medical Surgical      Dizziness and giddiness      Unspecified symptoms and signs involving the nervous system      Unspecified visual loss        Care Team  Provider Specialty Phone Fax Service Dates   Gu [place], [facility] , DO Internal Medicine   Current    Elmyra Ricks, M.D. Internal Medicine   Current    DAVIDSON, Hardin Negus MD Jerilynn Mages, MD Family Medicine: Adult Medicine [phone] Current    Valeria Batman, M.D. Internal Medicine   Current    Waylan Rocher, M.D. Internal Medicine   Current    Quintella Baton, NP Nurse Practitioner: Adult Health   Current    Melina Fiddler Nurse Practitioner: Gerontology [phone]  Current      [facility]  This patient has registered at the [facility] Emergency Department  For more information visit: [email or web address]     PLEASE NOTE:     1.   Any care recommendations and other clinical information are provided as guidelines or for historical purposes only, and providers should exercise their own clinical judgment when providing care.    2.   You may only use this information for purposes of  treatment, payment or health care operations activities, and subject to the limitations of applicable [facility] Policies.    3.   You should consult directly with the organization that provided a care guideline or other clinical history with any questions about additional information or accuracy or completeness of information provided.    ? 8828 [facility] - [email or web address]

## 2022-10-17 NOTE — Discharge Instructions (Signed)
You were seen in the emergency department for left sided low back pain with shooting pain down your left leg after a fall today.     There was no concern for a fracture of your spine today.    Your pain has improved with medications today. Please continue supportive care with alternating Tylenol and Ibuprofen every 3-4 hours as needed for pain.   Apply ice and heat therapy, alternating 20 minutes on and off.    If symptoms worsen, numbness of your inner thighs, inability to walk, or inability to control your bowel or bladder, come back to the emergency department.

## 2022-10-17 NOTE — ED Provider Notes (Signed)
EMERGENCY DEPARTMENT NOTE     Patient initially seen and examined at   ED PHYSICIAN ASSIGNED       Date/Time Event User Comments    10/17/22 0947 Physician Assigned Sharlett Iles, Wayland Salinas, DO assigned as Attending           ED MIDLEVEL (APP) ASSIGNED       Date/Time Event User Comments    10/17/22 [id number] PA/NP Provider Assigned Murrell Converse, Kedron Uno Sheldon Silvan, DNP FNP assigned as Nurse Practitioner            HISTORY OF PRESENT ILLNESS       Chief Complaint: Back Pain       77 y.o. female with past medical history as below presents with left low back pain with left leg radiculopathy. Symptoms started today after she fell onto her buttock when she stood up while getting out of bed today. Reports her legs just gave out. Hx of chronic low back pain. However, pain is different today. Pain is an achy pain that starts from her hip and travels all the way down to her knee. Pain is intermittent, worse with movement and certain positions. No pain medication taken. No saddle anesthesia, urinary retention, incontinence of bowel/bladder. Does report urinary frequency, but endorses she has been drinking a lot of water. She reports nausea without emesis. No nausea at this time. Last normal BM yesterday. Denies headache, dizziness, lightheadedness, numbness/tingling, changes in vision, CP, palpitations, abdominal pain, emesis, diarrhea, dysuria, hematuria, urinary urgency, constipation. Ambulatory at home after fall and in ED.     Independent Historian (other than patient): No  Additional History Provided by Independent Historian:  MEDICAL HISTORY     Past Medical History:  Past Medical History:   Diagnosis Date    Breast cancer 2014    right breast mastectomy    Exercise-induced angina     negative work up    GERD (gastroesophageal reflux disease)     Headache     resolved    Hyperlipidemia     Hypertension     Hypothyroidism     Low back pain     Malignant neoplasm of overlapping sites of right female breast 10/19/2015     Syncope and collapse     Chronic Vertigo    Vertigo        Past Surgical History:  Past Surgical History:   Procedure Laterality Date    COLONOSCOPY  2021    COLONOSCOPY, POLYPECTOMY N/A 07/21/2020    Procedure: COLONOSCOPY, POLYPECTOMY;  Surgeon: Einar Grad, MD;  Location: [facility];  Service: Gastroenterology;  Laterality: N/A;    EGD, BIOPSY N/A 07/21/2020    Procedure: EGD, BIOPSY;  Surgeon: Einar Grad, MD;  Location: [facility];  Service: Gastroenterology;  Laterality: N/A;    EXPLORATORY LAPAROTOMY N/A 05/22/2021    Procedure: EXPLORATORY LAPAROTOMY;  Surgeon: Delena Serve, MD;  Location: [facility];  Service: General;  Laterality: N/A;    HYSTERECTOMY      LAPAROSCOPY, DIAGNOSTIC N/A 05/22/2021    Procedure: LAPAROSCOPY, DIAGNOSTIC;  Surgeon: Delena Serve, MD;  Location: [facility];  Service: General;  Laterality: N/A;    LAPAROTOMY, COLECTOMY, RIGHT Right 05/22/2021    Procedure: LAPAROTOMY, COLECTOMY, RIGHT;  Surgeon: Delena Serve, MD;  Location: [facility];  Service: General;  Laterality: Right;    MASTECTOMY Right 2015       Social History:  Social History  Socioeconomic History    Marital status: Widowed   Tobacco Use    Smoking status: Never    Smokeless tobacco: Never   Vaping Use    Vaping Use: Never used   Substance and Sexual Activity    Alcohol use: Yes     Comment: occ    Drug use: No    Sexual activity: Not Currently     Social Determinants of Health     Financial Resource Strain: Low Risk  (05/12/2022)    Overall Financial Resource Strain (CARDIA)     Difficulty of Paying Living Expenses: Not hard at all   Food Insecurity: No Food Insecurity (10/17/2022)    Hunger Vital Sign     Worried About Running Out of Food in the Last Year: Never true     Ran Out of Food in the Last Year: Never true   Transportation Needs: No Transportation Needs (05/12/2022)    PRAPARE - Armed forces logistics/support/administrative officer (Medical): No     Lack of Transportation  (Non-Medical): No   Intimate Partner Violence: Not At Risk (10/17/2022)    Humiliation, Afraid, Rape, and Kick questionnaire     Fear of Current or Ex-Partner: No     Emotionally Abused: No     Physically Abused: No     Sexually Abused: No   Housing Stability: Unknown (05/12/2022)    Housing Stability Vital Sign     Unable to Pay for Housing in the Last Year: No     Unstable Housing in the Last Year: No       Family History:  Family History   Problem Relation Age of Onset    Breast cancer Neg Hx        Outpatient Medication:  Discharge Medication List as of 10/17/2022 11:34 AM        CONTINUE these medications which have NOT CHANGED    Details   amLODIPine (NORVASC) 10 MG tablet Take 1 tablet (10 mg total) by mouth daily, Starting Fri 04/02/2021, E-Rx      anastrozole (ARIMIDEX) 1 MG tablet Take 1 tablet (1 mg total) by mouth every morning, Starting Tue 06/01/2021, Normal      aspirin 81 MG chewable tablet Chew 1 tablet (81 mg total) by mouth daily, Starting Wed 01/29/2020, E-Rx      celecoxib (CeleBREX) 200 MG capsule Take 1 capsule (200 mg) by mouth daily, Historical Med      diclofenac Sodium (VOLTAREN) 1 % Gel topical gel Apply 2 g topically 4 (four) times daily, Starting Wed 11/17/2021, E-Rx      ezetimibe (ZETIA) 10 MG tablet Take 1 tablet (10 mg) by mouth daily, Starting Sat 09/17/2022, E-Rx      Ferrous Sulfate (IRON PO) Take by mouth, Historical Med      furosemide (LASIX) 10 MG/ML solution Take 1 mL (10 mg) by mouth daily, Historical Med      gabapentin (NEURONTIN) 100 MG capsule Take 1 capsule (100 mg total) by mouth 2 (two) times daily as needed (Neuropathy), Starting Tue 06/01/2021, Print      levothyroxine (SYNTHROID) 25 MCG tablet Take 1 tablet (25 mcg total) by mouth every morning, Starting Wed 01/29/2020, E-Rx      lisinopril (ZESTRIL) 40 MG tablet Take 1 tablet (40 mg total) by mouth daily, Starting Fri 04/02/2021, E-Rx      meclizine (ANTIVERT) 25 MG tablet Take 1 tablet (25 mg) by mouth 3 (three) times daily  as needed for  Dizziness, Starting Thu 11/11/2021, E-Rx      Multiple Vitamins-Minerals (MULTIVITAMIN WITH MINERALS) tablet Take 1 tablet by mouth every morning, Historical Med      pantoprazole (PROTONIX) 40 MG tablet Take 1 tablet (40 mg) by mouth daily, Historical Med      rosuvastatin (CRESTOR) 40 MG tablet Take 1 tablet (40 mg) by mouth daily, Starting Sat 09/17/2022, E-Rx               REVIEW OF SYSTEMS   Review of Systems See History of Present Illness  PHYSICAL EXAM     ED Triage Vitals   Enc Vitals Group      BP 10/17/22 0933 (!) 173/92      Heart Rate 10/17/22 0933 (!) 59      Resp Rate 10/17/22 0933 16      Temp 10/17/22 0933 98.1 F (36.7 C)      Temp Source 10/17/22 0933 Oral      SpO2 10/17/22 0933 100 %      Weight 10/17/22 0931 69 kg      Height 10/17/22 0931 1.6 m      Head Circumference --       Peak Flow --       Pain Score 10/17/22 0931 9      Pain Loc --       Pain Edu? --       Excl. in GC? --      Physical Exam   Nursing note and vitals reviewed.   Constitutional: Well developed, well nourished. Awake & Oriented x3.   Head: Atraumatic. Normocephalic.   Eyes: PERRL. EOMI. Conjunctivae are not pale.   ENT: Mucous membranes are moist and intact. Oropharynx is clear and symmetric.  Patent airway.   Neck:  Supple. Full ROM.   Cardiovascular: Regular rate. Regular rhythm. No murmurs, rubs, or gallops.   Pulmonary/Chest:  No evidence of respiratory distress. Clear to auscultation bilaterally.  No wheezing, rales or rhonchi.   Abdominal: Soft and non-distended. No tenderness.   Back: Full ROM. Nontender. No midline tenderness of thoracic or lumbar spine. No left paraspinal tenderness. +Left SLR test. No CVA tenderness.  Extremities: No edema. No cyanosis. No clubbing. Full range of motion in all extremities.   Skin:  Skin is warm and dry. No diaphoresis. No rash.   Neurological: Alert, awake, and appropriate. Normal speech. Motor normal.   Psychiatric: Good eye contact. Normal interaction, affect, and  behavior.     MEDICAL DECISION MAKING     PRIMARY PROBLEM LIST      Acute illness/injury DIAGNOSIS:Acute left sided low back pain with left sided sciatica  Chronic Illness Impacting Care of the above problem: Hypertension, Cancer, and Advanced age Increases complexity of evaluation, Increases the risk of severe disease, and Increase the risk of disease progression      DISCUSSION      77 yo female with a hx of HTN, HLD, breast cancer (2014, 2017) was evaluated for left low back pain with left leg radiculopathy s/p fall onto her buttock this morning after her legs gave out while standing up from getting out of bed.     No red flag signs concerning cauda equina or spinal cord compression. +L SLR test without significant tenderness of lumbar spine on exam. Symptoms appear to be sciatica vs MSK. Normal neurological exam without focal deficits. Will check UA to r/o UTI, give tylenol/IM toradol/lidoderm patch, and reassess. Will consider imaging if pain unimproved.  Case discussed with Dr. Sharlett Iles who was in agreement with the treatment and plan of care for this patient.       If patient is being hospitalized is severe sepsis or septic shock suspected?: N/A      Was management discussed with a consultant?: N/A    External Records Reviewed?: N/A    Additional Notes              ED Course as of 10/17/22 1609   Mon Oct 17, 2022   1123 UA negative for bacteria. [GY]   1132 Pain improved with medications.     UA results discussed. Recommended supportive care with Tylenol/Motrin, Ice/heat, and follow up with PCP. Red flag signs and strict return precautions discussed and all questions answered.  Clinically stable discharge home. [GY]      ED Course User Index  [GY] Sheldon Silvan, DNP FNP         Vital Signs: Reviewed the patient's vital signs.   Nursing Notes: Reviewed and utilized available nursing notes.  Medical Records Reviewed: Reviewed available past medical records.  Counseling: The emergency provider has spoken with the  patient and discussed today's findings, in addition to providing specific details for the plan of care.  Questions are answered and there is agreement with the plan.      MIPS DOCUMENTATION              CARDIAC STUDIES    The following cardiac studies were independently interpreted by me the Emergency Medicine Provider.  For full cardiac study results please see chart.    EMERGENCY IMAGING STUDIES    The following imagine studies were independently interpreted by me (emergency medicine provider):    RADIOLOGY IMAGING STUDIES      No orders to display       EMERGENCY DEPT. MEDICATIONS      ED Medication Orders (From admission, onward)      Start Ordered     Status Ordering Provider    10/17/22 1020 10/17/22 1019  acetaminophen (TYLENOL) tablet 1,000 mg  Once        Route: Oral  Ordered Dose: 1,000 mg       Last MAR action: Given Raphaela Cannaday D    10/17/22 1020 10/17/22 1019  ketorolac (TORADOL) injection 15 mg  Once        Route: Intramuscular  Ordered Dose: 15 mg       Last MAR action: Given Maye Parkinson D    10/17/22 1020 10/17/22 1019    Once in ED        Route: Transdermal  Ordered Dose: 1 patch       Discontinued Norvin Ohlin D            LABORATORY RESULTS    Ordered and independently interpreted AVAILABLE laboratory tests.   Results       Procedure Component Value Units Date/Time    Urinalysis Reflex to Microscopic Exam- Reflex to Culture [[id number]] Collected: 10/17/22 1033     Updated: 10/17/22 1051     Urine Type Urine, Clean Ca     Color, UA LT. YELLOW     Clarity, UA CLEAR     Specific Gravity UA 1.010     Urine pH 6.0     Leukocyte Esterase, UA NEGATIVE     Nitrite, UA NEGATIVE     Protein, UR NEGATIVE     Glucose, UA NEGATIVE     Ketones UA NEGATIVE  Urobilinogen, UA 0.2 mg/dL      Bilirubin, UA NEGATIVE     Blood, UA NEGATIVE              CRITICAL CARE/PROCEDURES    Procedures    DIAGNOSIS      Diagnosis:  Final diagnoses:   Acute left-sided low back pain with left-sided sciatica   Fall, initial  encounter   Urinary frequency       Disposition:  ED Disposition       ED Disposition   Discharge    Condition   --    Date/Time   Mon Oct 17, 2022 11:34 AM    Comment   Cephus Slater Misiaszek discharge to home/self care.    Condition at disposition: Stable                 Prescriptions:  Discharge Medication List as of 10/17/2022 11:34 AM        CONTINUE these medications which have NOT CHANGED    Details   amLODIPine (NORVASC) 10 MG tablet Take 1 tablet (10 mg total) by mouth daily, Starting Fri 04/02/2021, E-Rx      anastrozole (ARIMIDEX) 1 MG tablet Take 1 tablet (1 mg total) by mouth every morning, Starting Tue 06/01/2021, Normal      aspirin 81 MG chewable tablet Chew 1 tablet (81 mg total) by mouth daily, Starting Wed 01/29/2020, E-Rx      celecoxib (CeleBREX) 200 MG capsule Take 1 capsule (200 mg) by mouth daily, Historical Med      diclofenac Sodium (VOLTAREN) 1 % Gel topical gel Apply 2 g topically 4 (four) times daily, Starting Wed 11/17/2021, E-Rx      ezetimibe (ZETIA) 10 MG tablet Take 1 tablet (10 mg) by mouth daily, Starting Sat 09/17/2022, E-Rx      Ferrous Sulfate (IRON PO) Take by mouth, Historical Med      furosemide (LASIX) 10 MG/ML solution Take 1 mL (10 mg) by mouth daily, Historical Med      gabapentin (NEURONTIN) 100 MG capsule Take 1 capsule (100 mg total) by mouth 2 (two) times daily as needed (Neuropathy), Starting Tue 06/01/2021, Print      levothyroxine (SYNTHROID) 25 MCG tablet Take 1 tablet (25 mcg total) by mouth every morning, Starting Wed 01/29/2020, E-Rx      lisinopril (ZESTRIL) 40 MG tablet Take 1 tablet (40 mg total) by mouth daily, Starting Fri 04/02/2021, E-Rx      meclizine (ANTIVERT) 25 MG tablet Take 1 tablet (25 mg) by mouth 3 (three) times daily as needed for Dizziness, Starting Thu 11/11/2021, E-Rx      Multiple Vitamins-Minerals (MULTIVITAMIN WITH MINERALS) tablet Take 1 tablet by mouth every morning, Historical Med      pantoprazole (PROTONIX) 40 MG tablet Take 1 tablet (40 mg) by  mouth daily, Historical Med      rosuvastatin (CRESTOR) 40 MG tablet Take 1 tablet (40 mg) by mouth daily, Starting Sat 09/17/2022, E-Rx                 This note was generated by the [employer] EMR system/ Dragon speech recognition and may contain inherent errors or omissions not intended by the user. Grammatical errors, random word insertions, deletions and pronoun errors  are occasional consequences of this technology due to software limitations. Not all errors are caught or corrected. If there are questions or concerns about the content of this note or information contained within the body of this dictation they  should be addressed directly with the author for clarification.           Sheldon Silvan, DNP FNP  10/17/22 1609       Suanne Marker, DO  10/18/22 [phone]

## 2022-10-17 NOTE — ED Triage Notes (Signed)
C/o lower back pain and fall this morning

## 2022-10-30 ENCOUNTER — Emergency Department: Payer: Medicare Other

## 2022-10-30 ENCOUNTER — Emergency Department
Admission: EM | Admit: 2022-10-30 | Discharge: 2022-10-30 | Disposition: A | Discharge status: Home / Self Care | Payer: Medicare Other | Attending: Emergency Medicine | Admitting: Emergency Medicine

## 2022-10-30 DIAGNOSIS — K625 Hemorrhage of anus and rectum: Secondary | ICD-10-CM | POA: Insufficient documentation

## 2022-10-30 DIAGNOSIS — R42 Dizziness and giddiness: Secondary | ICD-10-CM | POA: Insufficient documentation

## 2022-10-30 DIAGNOSIS — K5900 Constipation, unspecified: Secondary | ICD-10-CM | POA: Insufficient documentation

## 2022-10-30 DIAGNOSIS — I639 Cerebral infarction, unspecified: Secondary | ICD-10-CM

## 2022-10-30 LAB — TYPE AND SCREEN
AB Screen Gel: NEGATIVE
ABO Rh: O POS

## 2022-10-30 LAB — COMPREHENSIVE METABOLIC PANEL
ALT: 15 U/L (ref 0–55)
AST (SGOT): 26 U/L (ref 5–41)
Albumin/Globulin Ratio: 1.3 (ref 0.9–2.2)
Albumin: 4 g/dL (ref 3.5–5.0)
Alkaline Phosphatase: 97 U/L (ref 37–117)
Anion Gap: 10 (ref 5.0–15.0)
BUN: 12 mg/dL (ref 7.0–21.0)
Bilirubin, Total: 0.4 mg/dL (ref 0.2–1.2)
CO2: 26 mEq/L (ref 17–29)
Calcium: 9.6 mg/dL (ref 7.9–10.2)
Chloride: 109 mEq/L (ref 99–111)
Creatinine: 0.9 mg/dL (ref 0.4–1.0)
Globulin: 3.1 g/dL (ref 2.0–3.6)
Glucose: 89 mg/dL (ref 70–100)
Potassium: 4.3 mEq/L (ref 3.5–5.3)
Protein, Total: 7.1 g/dL (ref 6.0–8.3)
Sodium: 145 mEq/L (ref 135–145)
eGFR: >60 mL/min/{1.73_m2} (ref >=60)

## 2022-10-30 LAB — CBC AND DIFFERENTIAL
Absolute NRBC: 0 10*3/uL (ref 0.00–0.00)
Basophils Absolute Automated: 0.02 10*3/uL (ref 0.00–0.08)
Basophils Automated: 0.5 %
Eosinophils Absolute Automated: 0.16 10*3/uL (ref 0.00–0.44)
Eosinophils Automated: 3.6 %
Hematocrit: 40.5 % (ref 34.7–43.7)
Hgb: 12.1 g/dL (ref 11.4–14.8)
Immature Granulocytes Absolute: 0.01 10*3/uL (ref 0.00–0.07)
Immature Granulocytes: 0.2 %
Instrument Absolute Neutrophil Count: 2.33 10*3/uL (ref 1.10–6.33)
Lymphocytes Absolute Automated: 1.54 10*3/uL (ref 0.42–3.22)
Lymphocytes Automated: 34.8 %
MCH: 26.7 pg (ref 25.1–33.5)
MCHC: 29.9 g/dL — ABNORMAL LOW (ref 31.5–35.8)
MCV: 89.2 fL (ref 78.0–96.0)
MPV: 11.6 fL (ref 8.9–12.5)
Monocytes Absolute Automated: 0.36 10*3/uL (ref 0.21–0.85)
Monocytes: 8.1 %
Neutrophils Absolute: 2.33 10*3/uL (ref 1.10–6.33)
Neutrophils: 52.8 %
Nucleated RBC: 0 /100 WBC (ref 0.0–0.0)
Platelets: 303 10*3/uL (ref 142–346)
RBC: 4.54 10*6/uL (ref 3.90–5.10)
RDW: 13 % (ref 11–15)
WBC: 4.42 10*3/uL (ref 3.10–9.50)

## 2022-10-30 LAB — APTT: PTT: 22 s — ABNORMAL LOW (ref 27–39)

## 2022-10-30 LAB — HIGH SENSITIVITY TROPONIN-I WITH DELTA
hs Troponin-I Delta: UNDETERMINED ng/L
hs Troponin-I: <2.7 ng/L

## 2022-10-30 LAB — PT/INR
PT INR: 0.9 (ref 0.9–1.1)
PT: <10 s — ABNORMAL LOW (ref 10.1–12.9)

## 2022-10-30 LAB — HIGH SENSITIVITY TROPONIN-I: hs Troponin-I: <2.7 ng/L

## 2022-10-30 LAB — STOOL OCCULT BLOOD: Stool Occult Blood: NEGATIVE

## 2022-10-30 MED ORDER — IOHEXOL 350 MG/ML IV SOLN
100.0000 mL | Freq: Once | INTRAVENOUS | Status: AC | PRN
Start: 2022-10-30 — End: 2022-10-30
  Administered 2022-10-30: 125 mL via INTRAVENOUS

## 2022-10-30 MED ORDER — MECLIZINE HCL 12.5 MG PO TABS
25.0000 mg | ORAL_TABLET | Freq: Once | ORAL | Status: AC
Start: 2022-10-30 — End: 2022-10-30
  Administered 2022-10-30: 25 mg via ORAL
  Filled 2022-10-30: qty 2

## 2022-10-30 MED ORDER — POLYETHYLENE GLYCOL 3350 17 G PO PACK
17.0000 g | PACK | Freq: Every day | ORAL | 0 refills | Status: DC
Start: 2022-10-30 — End: 2024-02-08

## 2022-10-30 MED ORDER — MECLIZINE HCL 25 MG PO TABS
25.0000 mg | ORAL_TABLET | Freq: Three times a day (TID) | ORAL | 0 refills | Status: DC | PRN
Start: 2022-10-30 — End: 2023-11-25

## 2022-10-30 NOTE — Consults (Signed)
NEUROLOGY CONSULTATION    Date Time: 10/30/22 2:38 PM  Patient Name: Emily [name]  Attending Physician: Georgana Curio, MD      Assessment & Plan:   Dizziness gait ataxia, likely psychogenic, given multiple prior presentations with multiple neurological symptoms and no clear-cut etiology having been found  Severe constipation  Rectal bleeding  Prior history of multiple strokelike presentations  History of anxiety as well    CT angio CT perfusion studies are negative  Will keep clinical watch on patient, before deciding further workup  Do not feel strongly about getting MRI at this time  Check orthostatics  Try and address issue of rectal bleeding  Will follow-up tomorrow        History of Present Illness:   Neurology consultation requested by:--> Dr. Thad [name]  Reason for consultation stroke alert  Patient is a 77 year old lady, presented to the ED because of rectal bleeding and dizziness, when she was assessed by the ED attending she was found to be severely ataxic barely while to stand up and stroke alert was called she underwent a stat head CT CT angio and perfusion studies were done which were negative as well    Patient currently reports to me that when she woke up, she was feeling fine she was constipated she was unable to defecate, and when she looked, she thought that there was rectal bleeding she became quite anxious and worried and hence presented to the ED.                             Past Medical History:     Past Medical History:   Diagnosis Date    Breast cancer 2014    right breast mastectomy    Exercise-induced angina     negative work up    GERD (gastroesophageal reflux disease)     Headache     resolved    Hyperlipidemia     Hypertension     Hypothyroidism     Low back pain     Malignant neoplasm of overlapping sites of right female breast 10/19/2015    Syncope and collapse     Chronic Vertigo    Vertigo        Meds:        Current medications Norvasc Arimidex aspirin Celebrex diclofenac  Zetia Lasix gabapentin Synthroid lisinopril Protonix Crestor meclizine      Allergies   Allergen Reactions    Morphine Itching    Morphine And Related Itching and Nausea And Vomiting     dizzy       Social & Family History:     Social History     Socioeconomic History    Marital status: Widowed   Tobacco Use    Smoking status: Never    Smokeless tobacco: Never   Vaping Use    Vaping Use: Never used   Substance and Sexual Activity    Alcohol use: Yes     Comment: occ    Drug use: No    Sexual activity: Not Currently     Social Determinants of Health     Financial Resource Strain: Low Risk  (05/12/2022)    Overall Financial Resource Strain (CARDIA)     Difficulty of Paying Living Expenses: Not hard at all   Food Insecurity: No Food Insecurity (10/30/2022)    Hunger Vital Sign     Worried About Running Out of Food in the Last Year:  Never true     Ran Out of Food in the Last Year: Never true   Transportation Needs: No Transportation Needs (05/12/2022)    PRAPARE - Therapist, art (Medical): No     Lack of Transportation (Non-Medical): No   Intimate Partner Violence: Not At Risk (10/30/2022)    Humiliation, Afraid, Rape, and Kick questionnaire     Fear of Current or Ex-Partner: No     Emotionally Abused: No     Physically Abused: No     Sexually Abused: No   Housing Stability: Unknown (05/12/2022)    Housing Stability Vital Sign     Unable to Pay for Housing in the Last Year: No     Unstable Housing in the Last Year: No       Family History   Problem Relation Age of Onset    Breast cancer Neg Hx            CODE STATUS: Patient is full code  I personally reviewed all of the medications.  Medication list generated using all available resources.  Elder abuse (physical)  - negative  Advanced care plan - reviewed from chart or in discussion with pt or family    Review of Systems:   No headache, eye, ear nose, throat problems; no coughing or wheezing or shortness of breath, No chest pain or orthopnea, no  abdominal pain, nausea or vomiting, No pain in the body or extremities, no psychiatric, neurological, endocrine, hematological or cardiac complaints except as noted above.          Physical Exam:   Blood pressure 195/81, pulse 64, temperature 97.1 F (36.2 C), temperature source Oral, resp. rate 19, SpO2 98 %.    HEENT: Normocephalic.no carotid bruits  Lungs:  CTA bil  Abd Soft   Cardiac:  S1,S2, normal rate and rhythm  Neck: supple, no cartoid bruits  Extremities: no edema  Skin: no rashes seen in exposed areas     Neuro:  Level of consciousness:  Alert and appropriate  Oriented:  X 3  Cognition:  Intact naming, recognition, concentration and following complex commands  Cranial Nerves:  II-XII intact  Strength:  No upper extremity drift, 5/5 strength x 4 extremities  Coordination:  Intact FTN testing  Reflexes:  +2 throughout, down going toes bil  Sensation: Intact x 4 extremities to LT, temp    no evidence of nystagmus on my exam  Labs:     Recent Labs   Lab 10/30/22  1156   Glucose 89   BUN 12.0   Creatinine 0.9   Calcium 9.6   Sodium 145   Potassium 4.3   Chloride 109   CO2 26   Albumin 4.0   AST (SGOT) 26   ALT 15   Bilirubin, Total 0.4   Alkaline Phosphatase 97     Recent Labs   Lab 10/30/22  1156   WBC 4.42   Hgb 12.1   Hematocrit 40.5   MCV 89.2   MCH 26.7   MCHC 29.9*   Platelets 303         Recent Labs     10/30/22  1236   PTT 22*   PT <10.0*   PT INR 0.9          Radiology Results (24 Hour)       Procedure Component Value Units Date/Time    CTA  Head & Neck [[id number]] Collected: 10/30/22 1253    Order  Status: Completed Updated: 10/30/22 1303    Narrative:      HISTORY: TIA OR CVA. stroke, dizziness.     COMPARISON: CT angiogram head and neck 05/12/2022    TECHNIQUE:  CT angiogram of the head and neck and CT perfusion of the head  performed with intravenous contrast. Multiplanar reformatted and 3D maximum  intensity projection (MIP) images were created and reviewed. Images  analyzed utilizing Viz ContaCT  AI algorithm for LVO detection and computer  aided triage. VIZ CT Perfusion analysis was performed and multiple relative  perfusion maps were created. Proximal internal carotid artery narrowing was  determined utilizing NASCET methodology. The following dose reduction  techniques were utilized: automated exposure control and/or adjustment of  the mA and/or KV according to patient size, and the use of an iterative  reconstruction technique.    CONTRAST: iohexol (OMNIPAQUE) 350 MG/ML injection 100 mL    FINDINGS:    CT Perfusion:  There are no matched or mismatched territorial perfusion defects.    CTA Head and Neck:  The great vessels originate from the aortic arch.  The brachiocephalic and  visualized subclavian arteries are patent.    The right common carotid artery is patent. There is mild atherosclerotic  change right carotid bifurcation the neck. There is no stenosis of the  proximal right internal carotid artery. The more distal cervical right  internal carotid artery is patent.  The intracranial right internal carotid  artery is patent and shows mild atherosclerotic calcification.    The left common carotid artery is patent.  There is mild atherosclerotic  change left carotid bifurcation the neck. There is no stenosis of the  proximal left internal carotid artery.  The more distal cervical left  internal carotid artery is patent.  The intracranial left internal carotid  artery is patent and shows mild atherosclerotic change.    The vertebral arteries are codominant. There is mild right vertebral artery  narrowing at C4-C5 due to indentation by adjacent uncovertebral  hypertrophy. The vertebral arteries are otherwise patent. The basilar  artery is patent.    The proximal visualized portions of the anterior cerebral, middle cerebral,  and posterior cerebral arteries are patent.    The surrounding tissues are within normal limits.      Impression:        1.Normal CT perfusion examination of the head. There is no  territorial  perfusion abnormality.  2.There is no stenosis of the proximal right internal carotid artery based  on NASCET criteria.    3.There is no stenosis of the proximal left internal carotid artery based  on NASCET criteria.  4.Mild narrowing right vertebral artery C4-C5 due to indentation by  adjacent uncovertebral hypertrophy.  5.No proximal large vessel occlusion detected on CT angiogram brain.  6.Scattered mild atherosclerotic changes.  7.There are advanced degenerative changes in the cervical spine.    Marcello Fennel, MD  10/30/2022 1:01 PM    CT Perfusion Brain [[id number]] Collected: 10/30/22 1253    Order Status: Completed Updated: 10/30/22 1303    Narrative:      HISTORY: TIA OR CVA. stroke, dizziness.     COMPARISON: CT angiogram head and neck 05/12/2022    TECHNIQUE:  CT angiogram of the head and neck and CT perfusion of the head  performed with intravenous contrast. Multiplanar reformatted and 3D maximum  intensity projection (MIP) images were created and reviewed. Images  analyzed utilizing Viz ContaCT AI algorithm for LVO detection and computer  aided triage. VIZ  CT Perfusion analysis was performed and multiple relative  perfusion maps were created. Proximal internal carotid artery narrowing was  determined utilizing NASCET methodology. The following dose reduction  techniques were utilized: automated exposure control and/or adjustment of  the mA and/or KV according to patient size, and the use of an iterative  reconstruction technique.    CONTRAST: iohexol (OMNIPAQUE) 350 MG/ML injection 100 mL    FINDINGS:    CT Perfusion:  There are no matched or mismatched territorial perfusion defects.    CTA Head and Neck:  The great vessels originate from the aortic arch.  The brachiocephalic and  visualized subclavian arteries are patent.    The right common carotid artery is patent. There is mild atherosclerotic  change right carotid bifurcation the neck. There is no stenosis of the  proximal right internal  carotid artery. The more distal cervical right  internal carotid artery is patent.  The intracranial right internal carotid  artery is patent and shows mild atherosclerotic calcification.    The left common carotid artery is patent.  There is mild atherosclerotic  change left carotid bifurcation the neck. There is no stenosis of the  proximal left internal carotid artery.  The more distal cervical left  internal carotid artery is patent.  The intracranial left internal carotid  artery is patent and shows mild atherosclerotic change.    The vertebral arteries are codominant. There is mild right vertebral artery  narrowing at C4-C5 due to indentation by adjacent uncovertebral  hypertrophy. The vertebral arteries are otherwise patent. The basilar  artery is patent.    The proximal visualized portions of the anterior cerebral, middle cerebral,  and posterior cerebral arteries are patent.    The surrounding tissues are within normal limits.      Impression:        1.Normal CT perfusion examination of the head. There is no territorial  perfusion abnormality.  2.There is no stenosis of the proximal right internal carotid artery based  on NASCET criteria.    3.There is no stenosis of the proximal left internal carotid artery based  on NASCET criteria.  4.Mild narrowing right vertebral artery C4-C5 due to indentation by  adjacent uncovertebral hypertrophy.  5.No proximal large vessel occlusion detected on CT angiogram brain.  6.Scattered mild atherosclerotic changes.  7.There are advanced degenerative changes in the cervical spine.    Marcello Fennel, MD  10/30/2022 1:01 PM    CT Head WO Contrast [[id number]] Collected: 10/30/22 1211    Order Status: Completed Updated: 10/30/22 1215    Narrative:      HISTORY: Neuro deficit, acute, stroke suspected. dizziness, unstable gait,  last normal 9-10 pm yesterday.     COMPARISON: CT head 09/16/2022.     TECHNIQUE: CT of the head performed without intravenous contrast. The  following  dose reduction techniques were utilized: automated exposure  control and/or adjustment of the mA and/or KV according to patient size,  and the use of an iterative reconstruction technique.    CONTRAST: None.    FINDINGS:  There is again mild cerebral volume loss and mild chronic small vessel  ischemic change in the periventricular white matter. There is no acute  cranial hemorrhage or mass effect. The calvarium appears intact. Visualized  upper paranasal sinuses and mastoid air cells are clear.        Impression:         1.No acute intracranial hemorrhage or mass effect identified. Chronic  findings as above.  Melody Haver, MD  10/30/2022 12:13 PM             All recent brain and spine imaging (MRI, CT) personally reviewed.    Chart reviewed    Case discussed with:  minutes; >50% time spent in counseling or coordination of care        This note was generated by the Epic EMR system/Speech recognition and may contain inherent errors or omissions not intended by the user. Grammatical errors, random word insertions, deletions and pronoun errors  are occasional consequences of this technology due to software limitations.   Not all errors are caught or corrected. If there are questions or concerns about the content of this note or information contained within the body of this dictation they should be addressed directly with the author for clarification.    Signed by: Cathe Mons, MD, MD  Spectralink: [phone]      Answering Service: [phone]

## 2022-10-30 NOTE — ED Notes (Signed)
Delay in CT with contrast due to difficulty with IV placement. Ultrasound IV attempted x 3.

## 2022-10-30 NOTE — ED Triage Notes (Signed)
Pt biba from home for blood in stool. Pt complaining of weakness, dizziness, left upper abdominal pain and dark red rectal bleeding that starting around 5am this morning. Pt reports constipation with passing of a hard stool yesterday. Per EMS, pt takes 81mg  Aspirin daily and blood glucose of 157 prior to arrival to ED. Pt denies SOB, CP, fevers, falls/injury.

## 2022-10-30 NOTE — EDIE (Signed)
[facility]?NOTIFICATION?10/30/2022 11:26?Evelen, Vazguez B?MRN: [id number]    [facility]'s patient encounter information:   IHK:?[id number]  Account [id number]  Billing Account [id number]      Criteria Met      5 ED Visits in 12 Months    Security and Safety  No Security Events were found.  ED Care Guidelines  There are currently no ED Care Guidelines for this patient. Please check your facility's medical records system.        Prescription Monitoring Program  000 ??- Narcotic Use Score   000 ??- Sedative Use Score   000 ??- Stimulant Use Score   000??- Overdose Risk Score  - All Scores range from 000-999 with 75% of the population scoring < 200 and only 1% scoring above 650  - The last digit of the narcotic, sedative, and stimulant score indicates the number of active prescriptions of that type  - Higher Use scores correlate with increased prescribers, pharmacies, mg equiv, and overlapping prescriptions   - Higher Overdose Risk Scores correlate with increased risk of unintentional overdose death   Concerning or unexpectedly high scores should prompt a review of the PMP record; this does not constitute checking PMP for prescribing purposes.    E.D. Visit Count (12 mo.)  Facility Visits   [facility] 10   Total 10   Note: Visits indicate total known visits.     Recent Emergency Department Visit Summary  Date Facility [facility] Type Diagnoses or Chief Complaint    Oct 30, 2022  [place].  [place]  [place]  Emergency      Blood in Stool      Oct 17, 2022  [place].  [place]  [place]  Emergency      Unspecified fall, initial encounter      Lumbago with sciatica, left side      Frequency of micturition      Back Pain      L leg pain      Sep 16, 2022  [place].  [place]  [place]  Emergency      Anesthesia of skin      Generalized weakness      Numbness      medic      Jun 30, 2022  [place].  [place]  [place]  Emergency      Dizziness  and giddiness      Constipation, unspecified      Hemorrhage of anus and rectum      Rectal Bleeding      Hematuria      Headache      Abdominal Pain      Dizziness      MEDIC #[id number]      May 12, 2022  [place].  [place]  [place]  Emergency      Extremity Weakness      Dizziness      Dizziness, weakness, left side uneasy      Feb 15, 2022  [place].  [place]  [place]  Emergency      Chest pain, unspecified      Generalized abdominal pain      Weakness      Generalized Weakness      Jan 14, 2022  [place].  [place]  [place]  Emergency      Unspecified symptoms and  signs involving the nervous system      Dizziness and giddiness      Unspecified visual loss      Cerebrovascular Accident      L side numbness      Nov 30, 2021  [place]  [place]  [place]  Emergency      Pulmonary fibrosis, unspecified      Dizziness and giddiness      Fatigue      Weakness; Fatigue      Nov 17, 2021  [place]  [place]  [place]  Emergency      Calculus of gallbladder without cholecystitis without obstruction      Unspecified abdominal pain      Dysuria      Abdominal Pain      Nausea      Scar Tissue Pain      Pain in right side, breast cancer patient      Nov 11, 2021  [place].  [place]  [place]  Emergency      Nontoxic single thyroid nodule      Paresthesia of skin      Chest pain, unspecified      Acute kidney failure, unspecified      Syncope and collapse      Dizziness and giddiness      Syncope      Chest Pain      chest pain,dizziness,weakness        Recent Inpatient Visit Summary  Date [place] Diagnoses or Chief Complaint    Jan 14, 2022  [place].  [place]  [place]  [facility]      Dizziness and giddiness      Unspecified symptoms and signs involving the nervous system      Unspecified visual loss        Care Team  Provider Specialty Phone Fax Service Dates   Floyd Hill, [facility] , DO Internal Medicine   Current    Elmyra Ricks, M.D. Internal Medicine    Current    DAVIDSON, Hardin Negus MD Jerilynn Mages, MD Family Medicine: Adult Medicine [phone] Current    Valeria Batman, M.D. Internal Medicine   Current    Waylan Rocher, M.D. Internal Medicine   Current    Quintella Baton, NP Nurse Practitioner: Adult Health   Current    Melina Fiddler Nurse Practitioner: Gerontology [phone]  Current      [facility]  This patient has registered at the [facility] Emergency Department  For more information visit: [email or web address]     PLEASE NOTE:     1.   Any care recommendations and other clinical information are provided as guidelines or for historical purposes only, and providers should exercise their own clinical judgment when providing care.    2.   You may only use this information for purposes of treatment, payment or health care operations activities, and subject to the limitations of applicable [facility] Policies.    3.   You should consult directly with the organization that provided a care guideline or other clinical history with any questions about additional information or accuracy or completeness of information provided.    ? 0623 [facility] - [email or web address]

## 2022-10-30 NOTE — ED Provider Notes (Signed)
EMERGENCY DEPARTMENT NOTE     Patient initially seen and examined at   ED PHYSICIAN ASSIGNED       Date/Time Event User Comments    10/30/22 1149 Physician Assigned Gearlean Alf, MD assigned as Attending           ED MIDLEVEL (APP) ASSIGNED       None            HISTORY OF PRESENT ILLNESS   Translator Used : No    Chief Complaint: Rectal Bleeding       77 y.o. female with past medical history as below   Location of symptoms: dizziness, bloody stool  Onset of symptoms: 8 am today, upon waking, last normal 9-10 pm last night (dizziness wise)  What was patient doing when symptoms started (Context): hx of chronic vertigo per chart (this was not given to me by pt)  Severity: dizziness  Timing: acute onset, constant dizziness; single episode of blood stool today with straining  Activities that worsen symptoms: standing- dizzy worse  Activities that improve symptoms: resting  Quality: patient is dizzy. She feels unsteady on her feet. Doesn't usually use walker or cane. She has difficulty taking a single step away from the stretcher here for me. Patient had a primary complaint of blood in stool today after straining. No bm yesterday. Has hx of constipation.   Radiation of symptoms: none  Associated signs and Symptoms: none   Are symptoms worsening? yes      Independent Historian (other than patient): No  Additional History Provided by Independent Historian:  MEDICAL HISTORY     Past Medical History:  Past Medical History:   Diagnosis Date    Breast cancer 2014    right breast mastectomy    Exercise-induced angina     negative work up    GERD (gastroesophageal reflux disease)     Headache     resolved    Hyperlipidemia     Hypertension     Hypothyroidism     Low back pain     Malignant neoplasm of overlapping sites of right female breast 10/19/2015    Syncope and collapse     Chronic Vertigo    Vertigo        Past Surgical History:  Past Surgical History:   Procedure Laterality Date    COLONOSCOPY   2021    COLONOSCOPY, POLYPECTOMY N/A 07/21/2020    Procedure: COLONOSCOPY, POLYPECTOMY;  Surgeon: Lisabeth Devoid, MD;  Location: [facility];  Service: Gastroenterology;  Laterality: N/A;    EGD, BIOPSY N/A 07/21/2020    Procedure: EGD, BIOPSY;  Surgeon: Lisabeth Devoid, MD;  Location: [facility];  Service: Gastroenterology;  Laterality: N/A;    EXPLORATORY LAPAROTOMY N/A 05/22/2021    Procedure: EXPLORATORY LAPAROTOMY;  Surgeon: Iran Sizer, MD;  Location: [facility];  Service: General;  Laterality: N/A;    HYSTERECTOMY      LAPAROSCOPY, DIAGNOSTIC N/A 05/22/2021    Procedure: LAPAROSCOPY, DIAGNOSTIC;  Surgeon: Iran Sizer, MD;  Location: [facility];  Service: General;  Laterality: N/A;    LAPAROTOMY, COLECTOMY, RIGHT Right 05/22/2021    Procedure: LAPAROTOMY, COLECTOMY, RIGHT;  Surgeon: Iran Sizer, MD;  Location: [facility];  Service: General;  Laterality: Right;    MASTECTOMY Right 2015       Social History:  Social History     Socioeconomic History    Marital status: Widowed  Tobacco Use    Smoking status: Never    Smokeless tobacco: Never   Vaping Use    Vaping Use: Never used   Substance and Sexual Activity    Alcohol use: Yes     Comment: occ    Drug use: No    Sexual activity: Not Currently     Social Determinants of Health     Financial Resource Strain: Low Risk  (05/12/2022)    Overall Financial Resource Strain (CARDIA)     Difficulty of Paying Living Expenses: Not hard at all   Food Insecurity: No Food Insecurity (10/30/2022)    Hunger Vital Sign     Worried About Running Out of Food in the Last Year: Never true     Ran Out of Food in the Last Year: Never true   Transportation Needs: No Transportation Needs (05/12/2022)    PRAPARE - Armed forces logistics/support/administrative officer (Medical): No     Lack of Transportation (Non-Medical): No   Intimate Partner Violence: Not At Risk (10/30/2022)    Humiliation, Afraid, Rape, and Kick questionnaire     Fear of Current or Ex-Partner:  No     Emotionally Abused: No     Physically Abused: No     Sexually Abused: No   Housing Stability: Unknown (05/12/2022)    Housing Stability Vital Sign     Unable to Pay for Housing in the Last Year: No     Unstable Housing in the Last Year: No       Family History:  Family History   Problem Relation Age of Onset    Breast cancer Neg Hx        Outpatient Medication:  Previous Medications    AMLODIPINE (NORVASC) 10 MG TABLET    Take 1 tablet (10 mg total) by mouth daily    ANASTROZOLE (ARIMIDEX) 1 MG TABLET    Take 1 tablet (1 mg total) by mouth every morning    ASPIRIN 81 MG CHEWABLE TABLET    Chew 1 tablet (81 mg total) by mouth daily    CELECOXIB (CELEBREX) 200 MG CAPSULE    Take 1 capsule (200 mg) by mouth daily    DICLOFENAC SODIUM (VOLTAREN) 1 % GEL TOPICAL GEL    Apply 2 g topically 4 (four) times daily    EZETIMIBE (ZETIA) 10 MG TABLET    Take 1 tablet (10 mg) by mouth daily    FERROUS SULFATE (IRON PO)    Take by mouth    FUROSEMIDE (LASIX) 10 MG/ML SOLUTION    Take 1 mL (10 mg) by mouth daily    GABAPENTIN (NEURONTIN) 100 MG CAPSULE    Take 1 capsule (100 mg total) by mouth 2 (two) times daily as needed (Neuropathy)    LEVOTHYROXINE (SYNTHROID) 25 MCG TABLET    Take 1 tablet (25 mcg total) by mouth every morning    LISINOPRIL (ZESTRIL) 40 MG TABLET    Take 1 tablet (40 mg total) by mouth daily    MULTIPLE VITAMINS-MINERALS (MULTIVITAMIN WITH MINERALS) TABLET    Take 1 tablet by mouth every morning    PANTOPRAZOLE (PROTONIX) 40 MG TABLET    Take 1 tablet (40 mg) by mouth daily    ROSUVASTATIN (CRESTOR) 40 MG TABLET    Take 1 tablet (40 mg) by mouth daily         REVIEW OF SYSTEMS   Review of Systems   Cardiovascular:  Negative for chest pain.  Gastrointestinal:  Positive for abdominal pain and blood in stool.   Neurological:  Positive for dizziness. Negative for sensory change, focal weakness and loss of consciousness.       PHYSICAL EXAM     ED Triage Vitals [10/30/22 1142]   Enc Vitals Group      BP  175/84      Heart Rate 60      Resp Rate 18      Temp 97.1 F (36.2 C)      Temp Source Oral      SpO2 99 %      Weight       Height       Head Circumference       Peak Flow       Pain Score 8      Pain Loc       Pain Edu?       Excl. in [place]?      Nursing note and vitals reviewed.    Constitutional: non-toxic  Head: Atraumatic.  Eyes: PERRL. EOMI. No scleral icterus. No nystagmus.  ENT: Mucous membranes are moist and intact. Oropharynx is clear. Patent airway.  Neck: Supple. No cervical lymphadenopathy.  Cardiovascular: Regular rate. Regular rhythm. No murmurs, rubs, or gallops.  Pulmonary/Chest: No evidence of respiratory distress. Clear to auscultation bilaterally. No wheezing, rales or rhonchi.   GI: Soft, non-distended abdomen. No tenderness to palpation of abdomen.  Extremities: No edema. No deformity.  Skin: No rash.   Neurological: Awake, alert and oriented x 3. CN II-XII intact. Strength intact. Sensation intact. Able to walk while holding on to the stretcher. Exaggerated gait when walking away from stretcher. Normal finger to nose.  Psychiatric: Appropriate affect. Appropriate mood. Appropriate behavior.    MEDICAL DECISION MAKING     PRIMARY PROBLEM LIST      Acute illness/injury with risk to life or bodily function (based on differential diagnosis or evaluation) DIAGNOSIS:dizziness, constipation  Chronic Illness Impacting Care of the above problem: Hypertension Increases complexity of evaluation, Increases the risk of severe disease, and Increase the risk of disease progression      DISCUSSION      I initially saw patient with concern for GI bleed. She complains of seeing a large amount of blood in stool today. Patient had stable vitals. She had normal abdominal exam. Rectal was normal. While I was awaiting a hemoccult card to arrive in room, I discussed more with patient her complaint of dizziness. She is very vague in describing this. I appreciate a normal neuro exam. I had her stand up and walk. This  was very exaggerated. I thought it was possible she could be having stroke. I called a code stroke. She was out of window for thrombolytic medication. Woke up with symptoms. Last normal last night. Ct head, angio, perfusion obtained. I discussed case with dr.khosla. he is familiar with patient. Multiple ED presentations for various neurological complaints. Hx of chronic vertigo. I doubt this is a stroke. I gave meclizine. I did not appreciate anything by labs to suggest alternative cause of dizziness such as acs. D/c home. Follow up with pcp.    If patient is being hospitalized is severe sepsis or septic shock suspected?: N/A      Was management discussed with a consultant?: Yes (explain)    External Records Reviewed?: N/A    Additional Notes                 NIH Stroke Score  Flowsheet Row Most Recent Value   NIH Stroke Scale    Interval Baseline admission to ED   1a. Level of Consciousness 0   1b. LOC Questions (age, month) 0  [Pt joking that she is 77 years old. After explaining the importance of the question, pt stated correct age.]   1c. LOC Commands (Open and close eyes, Grip AND release good hand) 0   2. Best Gaze 0  [pt difficulty following eye movement due to dizziness]   3. Visual Fields 1  [pt c/o difficulty seeing out of left eye]   4. Facial Palsy 0   5a. Motor Left Arm: (Arms with palm down X 10 seconds. Sitting = arms at 90 degrees. Supine = arms 45 degrees) 0   5b. Motor Right Arm: (Arms with palm down X 10 seconds. Sitting = arms at 90 degrees Supine = arms 45 degrees) 0   Total Motor Arms 0   6a. Motor Left Leg: (Leg elevated X 5 seconds Supine = Leg 30 degrees) 0   6b. Motor Right Leg: (Leg elevated X 5 seconds Supine = Leg 30 degrees) 0   Total Motor Legs 0   7. Limb Ataxia (Finger to nose, heel to shin) 0   8. Sensory (Sensation or grimace to pin prick on face, arm, trunk, and leg) 1  [Pt states left face, left arm and left leg feel "rough"]   9. Best language (Describe picture, name items,  read sentences from NIHSS booklet) 0   10. Dysarthria (Read list from NIHSS booklet) 1  [pt has thick accent. Per pt, son stated that she was talking "differently" last night. Time unknown by pt.]   11. Extinction and Inattention (formerly Neglect) - Test tactile and visual stimulation 0   NIHSS Total 3            Vital Signs: Reviewed the patient's vital signs.   Nursing Notes: Reviewed and utilized available nursing notes.  Medical Records Reviewed: Reviewed available past medical records.  Counseling: The emergency provider has spoken with the patient and discussed today's findings, in addition to providing specific details for the plan of care.  Questions are answered and there is agreement with the plan.      MIPS DOCUMENTATION              CARDIAC STUDIES    The following cardiac studies were independently interpreted by me the Emergency Medicine Provider.  For full cardiac study results please see chart.              EKG 1 interpreted by me (ED provider)    Time Interpreted:1256  Rate: Less than 60  Rhythm: Sinus Bradycardia  ST segments: No acute changes  STEMI?: NO  EKG interpretation: Normal                    EMERGENCY IMAGING STUDIES    The following imagine studies were independently interpreted by me (emergency medicine provider):    I read ct head to rule out ich                       RADIOLOGY IMAGING STUDIES      CTA  Head & Neck   Final Result      1.Normal CT perfusion examination of the head. There is no territorial   perfusion abnormality.   2.There is no stenosis of the proximal right internal carotid artery based   on NASCET criteria.  3.There is no stenosis of the proximal left internal carotid artery based   on NASCET criteria.   4.Mild narrowing right vertebral artery C4-C5 due to indentation by   adjacent uncovertebral hypertrophy.   5.No proximal large vessel occlusion detected on CT angiogram brain.   6.Scattered mild atherosclerotic changes.   7.There are advanced degenerative  changes in the cervical spine.      Marcello Fennel, MD   10/30/2022 1:01 PM      CT Perfusion Brain   Final Result      1.Normal CT perfusion examination of the head. There is no territorial   perfusion abnormality.   2.There is no stenosis of the proximal right internal carotid artery based   on NASCET criteria.     3.There is no stenosis of the proximal left internal carotid artery based   on NASCET criteria.   4.Mild narrowing right vertebral artery C4-C5 due to indentation by   adjacent uncovertebral hypertrophy.   5.No proximal large vessel occlusion detected on CT angiogram brain.   6.Scattered mild atherosclerotic changes.   7.There are advanced degenerative changes in the cervical spine.      Marcello Fennel, MD   10/30/2022 1:01 PM      CT Head WO Contrast   Final Result       1.No acute intracranial hemorrhage or mass effect identified. Chronic   findings as above.      Marcello Fennel, MD   10/30/2022 12:13 PM          EMERGENCY DEPT. MEDICATIONS      ED Medication Orders (From admission, onward)      Start Ordered     Status Ordering Provider    10/30/22 1328 10/30/22 1327  meclizine (ANTIVERT) tablet 25 mg  Once        Route: Oral  Ordered Dose: 25 mg       Last MAR action: Given Schuyler Amor W    10/30/22 1221 10/30/22 1221  iohexol (OMNIPAQUE) 350 MG/ML injection 100 mL  IMG once as needed        Route: Intravenous  Ordered Dose: 100 mL       Last MAR action: Imaging Agent Given Jamirah Zelaya W            LABORATORY RESULTS    Ordered and independently interpreted AVAILABLE laboratory tests.   Results       Procedure Component Value Units Date/Time    Type and Screen [[id number]] Collected: 10/30/22 1156    Specimen: Blood Updated: 10/30/22 1336     ABO Rh O POS     AB Screen Gel NEG    Narrative:      Pre-Surgical/Pre-Procedure->No  For transfusion?->No    High Sensitivity Troponin-I at 0 hrs [[id number]] Collected: 10/30/22 1236    Specimen: Blood Updated: 10/30/22 1333     hs Troponin-I <2.7 ng/L      Stool Occult Blood [[id number]] Collected: 10/30/22 1321    Specimen: Stool Updated: 10/30/22 1329     Stool Occult Blood Negative    Prothrombin time/INR [[id number]]  (Abnormal) Collected: 10/30/22 1236    Specimen: Blood Updated: 10/30/22 1329     PT <10.0 sec      PT INR 0.9    APTT [[id number]]  (Abnormal) Collected: 10/30/22 1236     Updated: 10/30/22 1329     PTT 22 sec     Comprehensive metabolic panel [[id number]] Collected: 10/30/22 1156    Specimen: Blood Updated: 10/30/22 1223  Glucose 89 mg/dL      BUN 12.0 mg/dL      Creatinine 0.9 mg/dL      Sodium 145 mEq/L      Potassium 4.3 mEq/L      Chloride 109 mEq/L      CO2 26 mEq/L      Calcium 9.6 mg/dL      Protein, Total 7.1 g/dL      Albumin 4.0 g/dL      AST (SGOT) 26 U/L      ALT 15 U/L      Alkaline Phosphatase 97 U/L      Bilirubin, Total 0.4 mg/dL      Globulin 3.1 g/dL      Albumin/Globulin Ratio 1.3     Anion Gap 10.0     eGFR >60.0 mL/min/1.73 m2     CBC and differential [id number]  (Abnormal) Collected: 10/30/22 1156    Specimen: Blood Updated: 10/30/22 1208     WBC 4.42 x10 3/uL      Hgb 12.1 g/dL      Hematocrit 40.5 %      Platelets 303 x10 3/uL      RBC 4.54 x10 6/uL      MCV 89.2 fL      MCH 26.7 pg      MCHC 29.9 g/dL      RDW 13 %      MPV 11.6 fL      Instrument Absolute Neutrophil Count 2.33 x10 3/uL      Neutrophils 52.8 %      Lymphocytes Automated 34.8 %      Monocytes 8.1 %      Eosinophils Automated 3.6 %      Basophils Automated 0.5 %      Immature Granulocytes 0.2 %      Nucleated RBC 0.0 /100 WBC      Neutrophils Absolute 2.33 x10 3/uL      Lymphocytes Absolute Automated 1.54 x10 3/uL      Monocytes Absolute Automated 0.36 x10 3/uL      Eosinophils Absolute Automated 0.16 x10 3/uL      Basophils Absolute Automated 0.02 x10 3/uL      Immature Granulocytes Absolute 0.01 x10 3/uL      Absolute NRBC 0.00 x10 3/uL     PT/APTT [id number] Collected: 10/30/22 1156     Updated: 10/30/22 1156              CRITICAL CARE/PROCEDURES     Procedures    DIAGNOSIS      Diagnosis:  Final diagnoses:   Vertigo   Constipation, unspecified constipation type       Disposition:  ED Disposition       ED Disposition   Discharge    Condition   --    Date/Time   Sun Oct 30, 2022  1:59 PM    Comment   Emily [name] discharge to home/self care.    Condition at disposition: Stable                 Prescriptions:  Patient's Medications   New Prescriptions    MECLIZINE (ANTIVERT) 25 MG TABLET    Take 1 tablet (25 mg) by mouth 3 (three) times daily as needed for Dizziness    POLYETHYLENE GLYCOL (MIRALAX) 17 G PACKET    Take 17 g by mouth daily   Previous Medications    AMLODIPINE (NORVASC) 10 MG TABLET    Take 1 tablet (10  mg total) by mouth daily    ANASTROZOLE (ARIMIDEX) 1 MG TABLET    Take 1 tablet (1 mg total) by mouth every morning    ASPIRIN 81 MG CHEWABLE TABLET    Chew 1 tablet (81 mg total) by mouth daily    CELECOXIB (CELEBREX) 200 MG CAPSULE    Take 1 capsule (200 mg) by mouth daily    DICLOFENAC SODIUM (VOLTAREN) 1 % GEL TOPICAL GEL    Apply 2 g topically 4 (four) times daily    EZETIMIBE (ZETIA) 10 MG TABLET    Take 1 tablet (10 mg) by mouth daily    FERROUS SULFATE (IRON PO)    Take by mouth    FUROSEMIDE (LASIX) 10 MG/ML SOLUTION    Take 1 mL (10 mg) by mouth daily    GABAPENTIN (NEURONTIN) 100 MG CAPSULE    Take 1 capsule (100 mg total) by mouth 2 (two) times daily as needed (Neuropathy)    LEVOTHYROXINE (SYNTHROID) 25 MCG TABLET    Take 1 tablet (25 mcg total) by mouth every morning    LISINOPRIL (ZESTRIL) 40 MG TABLET    Take 1 tablet (40 mg total) by mouth daily    MULTIPLE VITAMINS-MINERALS (MULTIVITAMIN WITH MINERALS) TABLET    Take 1 tablet by mouth every morning    PANTOPRAZOLE (PROTONIX) 40 MG TABLET    Take 1 tablet (40 mg) by mouth daily    ROSUVASTATIN (CRESTOR) 40 MG TABLET    Take 1 tablet (40 mg) by mouth daily   Modified Medications    No medications on file   Discontinued Medications    MECLIZINE (ANTIVERT) 25 MG TABLET    Take 1  tablet (25 mg) by mouth 3 (three) times daily as needed for Dizziness           This note was generated by the Epic EMR system/ Dragon speech recognition and may contain inherent errors or omissions not intended by the user. Grammatical errors, random word insertions, deletions and pronoun errors  are occasional consequences of this technology due to software limitations. Not all errors are caught or corrected. If there are questions or concerns about the content of this note or information contained within the body of this dictation they should be addressed directly with the author for clarification.           Lucia Bitter, MD  10/30/22 1459

## 2022-11-01 LAB — ECG 12-LEAD
Atrial Rate: 59 {beats}/min
P Axis: 57 degrees
P-R Interval: 184 ms
Q-T Interval: 418 ms
QRS Duration: 90 ms
QTC Calculation (Bezet): 413 ms
R Axis: 37 degrees
T Axis: 33 degrees
Ventricular Rate: 59 {beats}/min

## 2022-11-08 ENCOUNTER — Ambulatory Visit (INDEPENDENT_AMBULATORY_CARE_PROVIDER_SITE_OTHER): Payer: Medicare Other | Admitting: Internal Medicine

## 2022-11-08 ENCOUNTER — Encounter (INDEPENDENT_AMBULATORY_CARE_PROVIDER_SITE_OTHER): Payer: Self-pay | Admitting: Internal Medicine

## 2022-11-08 ENCOUNTER — Ambulatory Visit: Payer: Medicare Other | Admitting: Physician Assistant

## 2022-11-08 VITALS — BP 132/58 | HR 73 | Ht 63.0 in | Wt 123.8 lb

## 2022-11-08 DIAGNOSIS — I10 Essential (primary) hypertension: Secondary | ICD-10-CM

## 2022-11-08 DIAGNOSIS — R42 Dizziness and giddiness: Secondary | ICD-10-CM

## 2022-11-08 DIAGNOSIS — R0789 Other chest pain: Secondary | ICD-10-CM

## 2022-11-08 NOTE — Progress Notes (Signed)
[facility] OFFICE CONSULTATION    I had the pleasure of seeing Emily [name] today for cardiovascular evaluation. She is a pleasant 77 y.o. female with a history of hypertension, hyperlipidemia, chronic dizziness who presents for follow-up.  Patient was seen in the emergency room 10/22/2022 with dizziness and reported bloody stool.  Stool for occult blood was negative.  Normal hemoglobin.  CT head and neck negative for acute process.  She has had dizziness chronically.  She feels that the room is spinning.  She also complains of bilateral shoulder pains.  She feels pain going down her left arm.  She also has left-sided and right-sided chest discomfort.  Symptoms are usually at rest, no obvious exertional component.  She reports that she walks, exercise capacity is limited by back pain.  She reports being compliant with medications but is unsure of which medication she is taking.        PAST MEDICAL HISTORY: She has a past medical history of Breast cancer (2014), Exercise-induced angina, GERD (gastroesophageal reflux disease), Headache, Hyperlipidemia, Hypertension, Hypothyroidism, Low back pain, Malignant neoplasm of overlapping sites of right female breast (10/19/2015), Syncope and collapse, and Vertigo. She has a past surgical history that includes Mastectomy (Right, 2015); Hysterectomy; EGD, BIOPSY (N/A, 07/21/2020); COLONOSCOPY, POLYPECTOMY (N/A, 07/21/2020); Colonoscopy (2021); LAPAROSCOPY, DIAGNOSTIC (N/A, 05/22/2021); EXPLORATORY LAPAROTOMY (N/A, 05/22/2021); and LAPAROTOMY, COLECTOMY, RIGHT (Right, 05/22/2021).        MEDICATIONS:   Current Outpatient Medications   Medication Sig Dispense Refill    amLODIPine (NORVASC) 10 MG tablet Take 1 tablet (10 mg total) by mouth daily 90 tablet 1    anastrozole (ARIMIDEX) 1 MG tablet Take 1 tablet (1 mg total) by mouth every morning 30 tablet 0    aspirin 81 MG chewable tablet Chew 1 tablet (81 mg total) by mouth daily 90 tablet 0    celecoxib (CeleBREX) 200  MG capsule Take 1 capsule (200 mg) by mouth daily      diclofenac Sodium (VOLTAREN) 1 % Gel topical gel Apply 2 g topically 4 (four) times daily 50 g 0    ezetimibe (ZETIA) 10 MG tablet Take 1 tablet (10 mg) by mouth daily 90 tablet 0    Ferrous Sulfate (IRON PO) Take by mouth      furosemide (LASIX) 10 MG/ML solution Take 1 mL (10 mg) by mouth daily      gabapentin (NEURONTIN) 100 MG capsule Take 1 capsule (100 mg total) by mouth 2 (two) times daily as needed (Neuropathy) 60 capsule 0    levothyroxine (SYNTHROID) 25 MCG tablet Take 1 tablet (25 mcg total) by mouth every morning 30 tablet 0    lisinopril (ZESTRIL) 40 MG tablet Take 1 tablet (40 mg total) by mouth daily 90 tablet 1    meclizine (ANTIVERT) 25 MG tablet Take 1 tablet (25 mg) by mouth 3 (three) times daily as needed for Dizziness 15 tablet 0    Multiple Vitamins-Minerals (MULTIVITAMIN WITH MINERALS) tablet Take 1 tablet by mouth every morning      pantoprazole (PROTONIX) 40 MG tablet Take 1 tablet (40 mg) by mouth daily      polyethylene glycol (MIRALAX) 17 g packet Take 17 g by mouth daily 5 packet 0    rosuvastatin (CRESTOR) 40 MG tablet Take 1 tablet (40 mg) by mouth daily 30 tablet 0     No current facility-administered medications for this visit.           ALLERGIES:   Allergies  Allergen Reactions    Morphine Itching    Morphine And Related Itching and Nausea And Vomiting     dizzy         FAMILY HISTORY: Her family history is not on file.      SOCIAL HISTORY: She reports that she has never smoked. She has never used smokeless tobacco. She reports current alcohol use. She reports that she does not use drugs.      REVIEW OF SYSTEMS: All other systems reviewed and negative except as above.         PHYSICAL EXAMINATION  General Appearance:  A well-appearing female in no acute distress.    Visit Vitals  BP 132/58 (BP Site: Left arm, Patient Position: Sitting, Cuff Size: Medium)   Pulse 73   Ht 1.6 m (5\' 3" )   Wt 56.2 kg (123 lb 12.8 oz)   BMI 21.93  kg/m      HEENT: Sclera anicteric, conjunctiva without pallor, moist mucous membranes, normal dentition. No arcus.   Neck:  Supple without jugular venous distention. Thyroid nonpalpable. Normal carotid upstrokes without bruits.   Chest: Clear to auscultation bilaterally with good air movement and respiratory effort and no wheezes, rales, or rhonchi   Cardiovascular: Normal S1 and physiologically split S2 without murmurs. No gallops or rub. PMI of normal size and nondisplaced.   Extremities: Warm without edema  Skin: No rash, xanthoma or xanthelasma.   Neuro: Alert and oriented x3. Grossly intact. Strength is symmetrical. Normal mood and affect.           ECG: 10/30/2022, I have independently reviewed the tracing, sinus bradycardia at 59 bpm, otherwise normal ECG.            LABS:   Lab Results   Component Value Date    WBC 4.42 10/30/2022    HGB 12.1 10/30/2022    HCT 40.5 10/30/2022    PLT 303 10/30/2022    NA 145 10/30/2022    K 4.3 10/30/2022    MG 2.2 09/17/2022    BUN 12.0 10/30/2022    CREAT 0.9 10/30/2022    GLU 89 10/30/2022    AST 26 10/30/2022    ALT 15 10/30/2022    HGBA1C 5.5 09/17/2022    TSH 0.56 03/08/2021    TROPI <2.7 10/30/2022    TROPI Unable toCalc. 10/30/2022    DDIMER 3.21 (H) 10/15/2021    BNP 129 02/15/2022     Recent Labs     09/17/22  1348 09/16/22  2043 05/12/22  0738 01/15/22  0450 03/09/21  0554   Cholesterol 293* 248* 265* 226* 196   Triglycerides 67 80 61 62 50   HDL 85 74 72 61 61   LDL Calculated 195* 158* 181* 153* 125*          ECHOCARDIOGRAM: 01/28/2020       Summary    * Left ventricular ejection fraction is normal with an estimated ejection  fraction of 60-65%.    * Left ventricular wall thickness is mildly increased.    * No significant valvular dysfunction.           Nuclear stress test 04/17/2019    Summary    1. Normal stress and rest myocardial perfusion study without evidence of  inducible ischemia or scar.    2. Normal left ventricular function with calculated LVEF 77 %.     3. No prior studies are available for comparison.        IMPRESSION/RECOMMENDATIONS:  Chest pain -right and left-sided, mostly at rest, no significant exertional component.  Also has bilateral shoulder pain.  Symptoms do not appear consistent with cardiac etiology.  ECG from the emergency room 10/2022, tracings independently reviewed, no acute ST-T changes.  High-sensitivity troponins x 2 negative, less than 2.7.  Nuclear stress test ordered on multiple occasions, not yet done.  Patient is agreeable to have nuclear stress test done.  She says she may be able to walk the treadmill but is uncertain due to back pain.  Will order exercise nuclear stress test, okay to convert to [facility] if she is unable to walk the treadmill.    HTN -well-controlled on amlodipine, lisinopril.  Continue same.  Stable renal function, potassium 4.3 creatinine 0.9.  Continue with low-sodium diet.    HLD -LDL 195 December 2023.  She has rosuvastatin 40 mg p.o. daily on her medication list.  She does not know the names of the medications.  Importance of bringing all medications to follow-up visit discussed.  Will need verification.  Normal AST 26 ALT 15.  Further adjustments based on above.  LDL goal less than 100.    Anemia -resolved.  Most recent hemoglobin 12.1.  Patient reports bloody stools during ER visit/24.  Stool for blood was negative.    Dizziness -chronic.  Symptoms are suggestive of vertigo.  Uncertain whether she is taking meclizine, it does appear in her medication list.  ED records from 10/22/2022 reviewed, CT head with no acute process.  CTA head and neck, no significant vascular stenoses.  Advised to follow-up with PCP for evaluation and management of vertigo.  Will repeat echocardiogram.  Last echocardiogram in 2021 showed preserved EF 60 to 65%, no significant valvular abnormality.  Orthostatics today negative.    Follow-up in 6 months.        Elvina Sidle, MD  11/08/2022, 1:19 PM  [facility]  Cardiology  [phone]

## 2022-11-18 ENCOUNTER — Emergency Department
Admission: EM | Admit: 2022-11-18 | Discharge: 2022-11-18 | Discharge status: Against Medical Advice | Payer: Medicare Other | Attending: Emergency Medicine | Admitting: Emergency Medicine

## 2022-11-18 ENCOUNTER — Other Ambulatory Visit: Payer: Self-pay | Admitting: General Acute Care Hospital

## 2022-11-18 ENCOUNTER — Other Ambulatory Visit: Payer: Self-pay | Admitting: Family

## 2022-11-18 DIAGNOSIS — Z539 Procedure and treatment not carried out, unspecified reason: Secondary | ICD-10-CM | POA: Insufficient documentation

## 2022-11-18 DIAGNOSIS — Z1231 Encounter for screening mammogram for malignant neoplasm of breast: Secondary | ICD-10-CM

## 2022-11-18 NOTE — ED Triage Notes (Signed)
Pt wheeled to triage from wtg room. Pt c/o back pain that radiates down to left leg. Pt reports that pain in back and left leg started last night and woke pt up this morning at 8am this morning. Pt reports weakness and dizziness. Pt has a history of vertigo, hypertension, right breast cancer. Pt reports that she took her daily medication, denies taking medication for pain/discomfort today.

## 2022-11-18 NOTE — EDIE (Signed)
[facility]?NOTIFICATION?11/18/2022 10:42?Emily [name], Emily [name]?MRN: [id number]    [facility]'s patient encounter information:   [id number]  Account [id number]  Billing Account [id number]      Criteria Met      5 ED Visits in 12 Months    Security and Safety  No Security Events were found.  ED Care Guidelines  There are currently no ED Care Guidelines for this patient. Please check your facility's medical records system.        Prescription Monitoring Program  000 ??- Narcotic Use Score   000 ??- Sedative Use Score   000 ??- Stimulant Use Score   000??- Overdose Risk Score  - All Scores range from 000-999 with 75% of the population scoring < 200 and only 1% scoring above 650  - The last digit of the narcotic, sedative, and stimulant score indicates the number of active prescriptions of that type  - Higher Use scores correlate with increased prescribers, pharmacies, mg equiv, and overlapping prescriptions   - Higher Overdose Risk Scores correlate with increased risk of unintentional overdose death   Concerning or unexpectedly high scores should prompt a review of the PMP record; this does not constitute checking PMP for prescribing purposes.    E.D. Visit Count (12 mo.)  Facility Visits   [facility] 9   Total 9   Note: Visits indicate total known visits.     Recent Emergency Department Visit Summary  Date Facility [facility] Type Diagnoses or Chief Complaint    Nov 18, 2022  [place].  [place]  [place]  Emergency      tingling; dizziness      Oct 30, 2022  [place].  [place].  [place]  Emergency      Constipation, unspecified      Dizziness and giddiness      Melena      Rectal Bleeding      Blood in Stool      Oct 17, 2022  [place].  [place].  [place]  Emergency      Unspecified fall, initial encounter      Lumbago with sciatica, left side      Frequency of micturition      Back Pain      L leg pain      Sep 16, 2022  [place].   [place].  [place]  Emergency      Anesthesia of skin      Generalized weakness      Numbness      medic      Jun 30, 2022  [place].  [place].  [place]  Emergency      Dizziness and giddiness      Constipation, unspecified      Hemorrhage of anus and rectum      Rectal Bleeding      Hematuria      Headache      Abdominal Pain      Dizziness      MEDIC #[id number]      May 12, 2022  [place].  [place].  [place]  Emergency      Extremity Weakness      Dizziness      Dizziness, weakness, left side uneasy      Feb 15, 2022  [place].  [place].  [place]  Emergency  Chest pain, unspecified      Generalized abdominal pain      Weakness      Generalized Weakness      Jan 14, 2022  [place].  [place].  [place]  Emergency      Unspecified symptoms and signs involving the nervous system      Dizziness and giddiness      Unspecified visual loss      Cerebrovascular Accident      L side numbness      Nov 30, 2021  [place].  [place].  [place]  Emergency      Pulmonary fibrosis, unspecified      Dizziness and giddiness      Fatigue      Weakness; Fatigue        Recent Inpatient Visit Summary  Date Facility [facility] Type Diagnoses or Chief Complaint    Jan 14, 2022  [place].  [place].    [facility]      Dizziness and giddiness      Unspecified symptoms and signs involving the nervous system      Unspecified visual loss        Care Team  Provider Specialty Phone Fax Service Dates   Parker, AMR , DO Internal Medicine   Current    Elmyra Ricks, M.D. Internal Medicine   Current    DAVIDSON, Hardin Negus MD Jerilynn Mages, MD Family Medicine: Adult Medicine [phone] Current    Valeria Batman, M.D. Internal Medicine   Current    Waylan Rocher, M.D. Internal Medicine   Current    Quintella Baton, NP Nurse Practitioner: Adult Health   Current    Melina Fiddler Nurse Practitioner: Gerontology [phone]  Current      [facility]  This patient has registered  at the [facility] Emergency Department  For more information visit: [email or web address]     PLEASE NOTE:     1.   Any care recommendations and other clinical information are provided as guidelines or for historical purposes only, and providers should exercise their own clinical judgment when providing care.    2.   You may only use this information for purposes of treatment, payment or health care operations activities, and subject to the limitations of applicable [facility] Policies.    3.   You should consult directly with the organization that provided a care guideline or other clinical history with any questions about additional information or accuracy or completeness of information provided.    ? 123456 [facility] - [email or web address]

## 2022-11-21 ENCOUNTER — Emergency Department
Admission: EM | Admit: 2022-11-21 | Discharge: 2022-11-21 | Disposition: A | Discharge status: Home / Self Care | Payer: Medicare Other | Attending: Emergency Medicine | Admitting: Emergency Medicine

## 2022-11-21 DIAGNOSIS — M5432 Sciatica, left side: Secondary | ICD-10-CM | POA: Insufficient documentation

## 2022-11-21 DIAGNOSIS — R42 Dizziness and giddiness: Secondary | ICD-10-CM

## 2022-11-21 DIAGNOSIS — R030 Elevated blood-pressure reading, without diagnosis of hypertension: Secondary | ICD-10-CM | POA: Insufficient documentation

## 2022-11-21 DIAGNOSIS — R102 Pelvic and perineal pain: Secondary | ICD-10-CM | POA: Insufficient documentation

## 2022-11-21 DIAGNOSIS — R531 Weakness: Secondary | ICD-10-CM | POA: Insufficient documentation

## 2022-11-21 LAB — CBC AND DIFFERENTIAL
Absolute NRBC: 0 10*3/uL (ref 0.00–0.00)
Basophils Absolute Automated: 0.04 10*3/uL (ref 0.00–0.08)
Basophils Automated: 0.8 %
Eosinophils Absolute Automated: 0.15 10*3/uL (ref 0.00–0.44)
Eosinophils Automated: 2.9 %
Hematocrit: 39.3 % (ref 34.7–43.7)
Hgb: 11.9 g/dL (ref 11.4–14.8)
Immature Granulocytes Absolute: 0.01 10*3/uL (ref 0.00–0.07)
Immature Granulocytes: 0.2 %
Instrument Absolute Neutrophil Count: 2.94 10*3/uL (ref 1.10–6.33)
Lymphocytes Absolute Automated: 1.72 10*3/uL (ref 0.42–3.22)
Lymphocytes Automated: 33.1 %
MCH: 27.1 pg (ref 25.1–33.5)
MCHC: 30.3 g/dL — ABNORMAL LOW (ref 31.5–35.8)
MCV: 89.5 fL (ref 78.0–96.0)
MPV: 11.1 fL (ref 8.9–12.5)
Monocytes Absolute Automated: 0.33 10*3/uL (ref 0.21–0.85)
Monocytes: 6.4 %
Neutrophils Absolute: 2.94 10*3/uL (ref 1.10–6.33)
Neutrophils: 56.6 %
Nucleated RBC: 0 /100 WBC (ref 0.0–0.0)
Platelets: 256 10*3/uL (ref 142–346)
RBC: 4.39 10*6/uL (ref 3.90–5.10)
RDW: 13 % (ref 11–15)
WBC: 5.19 10*3/uL (ref 3.10–9.50)

## 2022-11-21 LAB — URINALYSIS WITH REFLEX TO MICROSCOPIC EXAM - REFLEX TO CULTURE
Bilirubin, UA: NEGATIVE
Blood, UA: NEGATIVE
Glucose, UA: NEGATIVE
Ketones UA: NEGATIVE
Leukocyte Esterase, UA: NEGATIVE
Nitrite, UA: NEGATIVE
Protein, UR: NEGATIVE
Specific Gravity UA: 1.009 (ref 1.001–1.035)
Urine pH: 7.5 (ref 5.0–8.0)
Urobilinogen, UA: NORMAL mg/dL (ref 0.2–2.0)

## 2022-11-21 LAB — COMPREHENSIVE METABOLIC PANEL
ALT: 29 U/L (ref 0–55)
AST (SGOT): 39 U/L (ref 5–41)
Albumin/Globulin Ratio: 1.4 (ref 0.9–2.2)
Albumin: 3.9 g/dL (ref 3.5–5.0)
Alkaline Phosphatase: 87 U/L (ref 37–117)
Anion Gap: 7 (ref 5.0–15.0)
BUN: 15 mg/dL (ref 7.0–21.0)
Bilirubin, Total: 0.3 mg/dL (ref 0.2–1.2)
CO2: 30 mEq/L — ABNORMAL HIGH (ref 17–29)
Calcium: 9 mg/dL (ref 7.9–10.2)
Chloride: 109 mEq/L (ref 99–111)
Creatinine: 1 mg/dL (ref 0.4–1.0)
Globulin: 2.8 g/dL (ref 2.0–3.6)
Glucose: 99 mg/dL (ref 70–100)
Potassium: 4.2 mEq/L (ref 3.5–5.3)
Protein, Total: 6.7 g/dL (ref 6.0–8.3)
Sodium: 146 mEq/L — ABNORMAL HIGH (ref 135–145)
eGFR: 57.8 mL/min/{1.73_m2} — AB (ref >=60)

## 2022-11-21 LAB — HIGH SENSITIVITY TROPONIN-I: hs Troponin-I: <2.7 ng/L

## 2022-11-21 MED ORDER — CYCLOBENZAPRINE HCL 10 MG PO TABS
10.0000 mg | ORAL_TABLET | Freq: Three times a day (TID) | ORAL | 0 refills | Status: AC | PRN
Start: 2022-11-21 — End: 2022-12-06

## 2022-11-21 MED ORDER — SODIUM CHLORIDE 0.9 % IV BOLUS
1000.0000 mL | Freq: Once | INTRAVENOUS | Status: AC
Start: 2022-11-21 — End: 2022-11-21
  Administered 2022-11-21: 1000 mL via INTRAVENOUS

## 2022-11-21 MED ORDER — KETOROLAC TROMETHAMINE 30 MG/ML IJ SOLN
30.0000 mg | Freq: Once | INTRAMUSCULAR | Status: AC
Start: 2022-11-21 — End: 2022-11-21
  Administered 2022-11-21: 30 mg via INTRAVENOUS
  Filled 2022-11-21: qty 1

## 2022-11-21 NOTE — ED Notes (Signed)
Pt difficult IV access due to limb restriction. Using [place] to locate IV access.

## 2022-11-21 NOTE — EDIE (Signed)
[facility]?NOTIFICATION?11/21/2022 10:19?[place], Phillipp B?MRN: [id number]    [facility]'s patient encounter information:   [id number]  Account [id number]  Billing Account [id number]      Criteria Met      5 ED Visits in 12 Months    Security and Safety  No Security Events were found.  ED Care Guidelines  There are currently no ED Care Guidelines for this patient. Please check your facility's medical records system.        Prescription Monitoring Program  000 ??- Narcotic Use Score   000 ??- Sedative Use Score   000 ??- Stimulant Use Score   000??- Overdose Risk Score  - All Scores range from 000-999 with 75% of the population scoring < 200 and only 1% scoring above 650  - The last digit of the narcotic, sedative, and stimulant score indicates the number of active prescriptions of that type  - Higher Use scores correlate with increased prescribers, pharmacies, mg equiv, and overlapping prescriptions   - Higher Overdose Risk Scores correlate with increased risk of unintentional overdose death   Concerning or unexpectedly high scores should prompt a review of the PMP record; this does not constitute checking PMP for prescribing purposes.    E.D. Visit Count (12 mo.)  Facility Visits   [facility] 10   Total 10   Note: Visits indicate total known visits.     Recent Emergency Department Visit Summary  Date Facility [facility] Type Diagnoses or Chief Complaint    Nov 21, 2022  [place].  [place].  [place]  Emergency      Generalized Weakness      Nov 18, 2022  [place].  [place].  [place]  Emergency      Encounter for screening mammogram for malignant neoplasm of breast      Dizziness      Back Pain      Leg Pain      tingling; dizziness      Oct 30, 2022  [place].  [place].  [place]  Emergency      Constipation, unspecified      Dizziness and giddiness      Melena      Rectal Bleeding      Blood in Stool      Oct 17, 2022  [place].  [place].  [place]  Emergency      Unspecified fall, initial encounter      Lumbago with sciatica, left side      Frequency of micturition      Back Pain      L leg pain      Sep 16, 2022  [place].  [place].  [place]  Emergency      Anesthesia of skin      Generalized weakness      Numbness      medic      Jun 30, 2022  [place].  [place].  [place]  Emergency      Dizziness and giddiness      Constipation, unspecified      Hemorrhage of anus and rectum      Rectal Bleeding      Hematuria      Headache      Abdominal Pain      Dizziness      MEDIC #[id number]  May 12, 2022  [place]  [place].  [place]  Emergency      Extremity Weakness      Dizziness      Dizziness, weakness, left side uneasy      Feb 15, 2022  [place].  [place].  [place]  Emergency      Chest pain, unspecified      Generalized abdominal pain      Weakness      Generalized Weakness      Jan 14, 2022  [place].  [place].  [place]  Emergency      Unspecified symptoms and signs involving the nervous system      Dizziness and giddiness      Unspecified visual loss      Cerebrovascular Accident      L side numbness      Nov 30, 2021  [place].  [place]  [place]  Emergency      Pulmonary fibrosis, unspecified      Dizziness and giddiness      Fatigue      Weakness; Fatigue        Recent Inpatient Visit Summary  Date Facility [facility] Type Diagnoses or Chief Complaint    Jan 14, 2022  [place].  [place].    Medical Surgical      Dizziness and giddiness      Unspecified symptoms and signs involving the nervous system      Unspecified visual loss        Care Team  Provider Specialty Phone Fax Service Dates   [place], AMR , DO Internal Medicine   Current    Elmyra Ricks, M.D. Internal Medicine   Current    DAVIDSON, Hardin Negus MD Jerilynn Mages, MD Family Medicine: Adult Medicine [phone] Current    Valeria Batman, M.D. Internal Medicine   Current    Waylan Rocher, M.D. Internal Medicine    Current    Quintella Baton, NP Nurse Practitioner: Adult Health   Current    Melina Fiddler Nurse Practitioner: Gerontology [phone]  Current      [facility]  This patient has registered at the [facility] Emergency Department  For more information visit: [email or web address]     PLEASE NOTE:     1.   Any care recommendations and other clinical information are provided as guidelines or for historical purposes only, and providers should exercise their own clinical judgment when providing care.    2.   You may only use this information for purposes of treatment, payment or health care operations activities, and subject to the limitations of applicable [facility] Policies.    3.   You should consult directly with the organization that provided a care guideline or other clinical history with any questions about additional information or accuracy or completeness of information provided.    ? [id number] [facility] - [email or web address]

## 2022-11-21 NOTE — ED Provider Notes (Signed)
EMERGENCY DEPARTMENT NOTE     Patient initially seen and examined at   ED PHYSICIAN ASSIGNED       Date/Time Event User Comments    11/21/22 1021 Physician Assigned Freddi Che, Arrie Senate, DO assigned as Attending           ED MIDLEVEL (APP) ASSIGNED       None            HISTORY OF PRESENT ILLNESS       Chief Complaint: Generalized weakness       77 y.o. female with past medical history as below who presents emergency department the chief complaint of some left low back pain radiating down her left leg.  Patient also has some other complaints of some irritation in her vaginal area as well as some breast pain on the left and some intermittent lightheadedness.  Patient does not feel lightheaded at this time.  She has no chest pain.  No shortness of breath.  No abdominal pain.  She has no other complaints at this time.    Independent Historian: No  Additional History Provided:  MEDICAL HISTORY     Past Medical History:  Past Medical History:   Diagnosis Date    Breast cancer 2014    right breast mastectomy    Exercise-induced angina     negative work up    GERD (gastroesophageal reflux disease)     Headache     resolved    Hyperlipidemia     Hypertension     Hypothyroidism     Low back pain     Malignant neoplasm of overlapping sites of right female breast 10/19/2015    Syncope and collapse     Chronic Vertigo    Vertigo        Past Surgical History:  Past Surgical History:   Procedure Laterality Date    COLONOSCOPY  2021    COLONOSCOPY, POLYPECTOMY N/A 07/21/2020    Procedure: COLONOSCOPY, POLYPECTOMY;  Surgeon: Lisabeth Devoid, MD;  Location: [facility];  Service: Gastroenterology;  Laterality: N/A;    EGD, BIOPSY N/A 07/21/2020    Procedure: EGD, BIOPSY;  Surgeon: Lisabeth Devoid, MD;  Location: [facility];  Service: Gastroenterology;  Laterality: N/A;    EXPLORATORY LAPAROTOMY N/A 05/22/2021    Procedure: EXPLORATORY LAPAROTOMY;  Surgeon: Iran Sizer, MD;  Location: [facility];  Service: General;   Laterality: N/A;    HYSTERECTOMY      LAPAROSCOPY, DIAGNOSTIC N/A 05/22/2021    Procedure: LAPAROSCOPY, DIAGNOSTIC;  Surgeon: Iran Sizer, MD;  Location: [facility];  Service: General;  Laterality: N/A;    LAPAROTOMY, COLECTOMY, RIGHT Right 05/22/2021    Procedure: LAPAROTOMY, COLECTOMY, RIGHT;  Surgeon: Iran Sizer, MD;  Location: [facility];  Service: General;  Laterality: Right;    MASTECTOMY Right 2015       Social History:  Social History     Socioeconomic History    Marital status: Widowed   Tobacco Use    Smoking status: Never    Smokeless tobacco: Never   Vaping Use    Vaping Use: Never used   Substance and Sexual Activity    Alcohol use: Yes     Comment: occ    Drug use: No    Sexual activity: Not Currently     Social Determinants of Health     Financial Resource Strain: Low Risk  (05/12/2022)    Overall Financial Resource  Strain (CARDIA)     Difficulty of Paying Living Expenses: Not hard at all   Food Insecurity: No Food Insecurity (11/21/2022)    Hunger Vital Sign     Worried About Running Out of Food in the Last Year: Never true     Ran Out of Food in the Last Year: Never true   Transportation Needs: No Transportation Needs (05/12/2022)    PRAPARE - Armed forces logistics/support/administrative officer (Medical): No     Lack of Transportation (Non-Medical): No   Intimate Partner Violence: Not At Risk (11/18/2022)    Humiliation, Afraid, Rape, and Kick questionnaire     Fear of Current or Ex-Partner: No     Emotionally Abused: No     Physically Abused: No     Sexually Abused: No   Housing Stability: Unknown (05/12/2022)    Housing Stability Vital Sign     Unable to Pay for Housing in the Last Year: No     Unstable Housing in the Last Year: No       Family History:  Family History   Problem Relation Age of Onset    Breast cancer Neg Hx        Outpatient Medication:  Previous Medications    AMLODIPINE (NORVASC) 10 MG TABLET    Take 1 tablet (10 mg total) by mouth daily    ANASTROZOLE (ARIMIDEX) 1  MG TABLET    Take 1 tablet (1 mg total) by mouth every morning    ASPIRIN 81 MG CHEWABLE TABLET    Chew 1 tablet (81 mg total) by mouth daily    CELECOXIB (CELEBREX) 200 MG CAPSULE    Take 1 capsule (200 mg) by mouth daily    DICLOFENAC SODIUM (VOLTAREN) 1 % GEL TOPICAL GEL    Apply 2 g topically 4 (four) times daily    EZETIMIBE (ZETIA) 10 MG TABLET    Take 1 tablet (10 mg) by mouth daily    FERROUS SULFATE (IRON PO)    Take by mouth    FUROSEMIDE (LASIX) 10 MG/ML SOLUTION    Take 1 mL (10 mg) by mouth daily    GABAPENTIN (NEURONTIN) 100 MG CAPSULE    Take 1 capsule (100 mg total) by mouth 2 (two) times daily as needed (Neuropathy)    LEVOTHYROXINE (SYNTHROID) 25 MCG TABLET    Take 1 tablet (25 mcg total) by mouth every morning    LISINOPRIL (ZESTRIL) 40 MG TABLET    Take 1 tablet (40 mg total) by mouth daily    MECLIZINE (ANTIVERT) 25 MG TABLET    Take 1 tablet (25 mg) by mouth 3 (three) times daily as needed for Dizziness    MULTIPLE VITAMINS-MINERALS (MULTIVITAMIN WITH MINERALS) TABLET    Take 1 tablet by mouth every morning    PANTOPRAZOLE (PROTONIX) 40 MG TABLET    Take 1 tablet (40 mg) by mouth daily    POLYETHYLENE GLYCOL (MIRALAX) 17 G PACKET    Take 17 g by mouth daily    ROSUVASTATIN (CRESTOR) 40 MG TABLET    Take 1 tablet (40 mg) by mouth daily         REVIEW OF SYSTEMS   Review of Systems   Respiratory:  Negative for shortness of breath.    Cardiovascular:  Negative for chest pain.   Gastrointestinal:  Negative for abdominal pain.   Musculoskeletal:  Positive for back pain and myalgias (left leg pain).   Neurological:  Positive for dizziness.  All other systems reviewed and are negative.   See History of Present Illness   PHYSICAL EXAM     ED Triage Vitals [11/21/22 1021]   Enc Vitals Group      BP (!) 160/97      Heart Rate 70      Resp Rate 16      Temp 98.3 F (36.8 C)      Temp Source Oral      SpO2 99 %      Weight 71.6 kg      Height       Head Circumference       Peak Flow       Pain Score 9       Pain Loc       Pain Edu?       Excl. in [place]?      Physical Exam   Constitutional:  Well developed, well nourished. alert and awake  Head:  Atraumatic. Normocephalic.    Eyes:  PERRL. EOMI. No scleral icterus  ENT:  Mucous membranes are slightly tacky. Oropharynx is clear.  External ears normal. Patent airway.  Neck:  Supple. Full ROM.    Cardiovascular:  Regular rate. Regular rhythm. No murmurs, rubs, or gallops.  Pulmonary/Chest:  No evidence of respiratory distress. Clear to auscultation bilaterally.  No wheezing, rales or rhonchi.   Abdominal:  Soft and non-distended. No tenderness. No rebound, guarding, or rigidity.  Back:  Full ROM. No midline spinal tenderness. 2+ DTRs of the patella and achilles bilaterally. 5/5 strength with knee flexion and extension and plantar flexion and extension. Able to raise great toe bilaterally. Gross sensation intact throughout.  Negative bilateral straight leg raise.   Extremities:  No edema. No cyanosis. Full range of motion in all extremities.  Skin:  Skin is warm and dry.  No diaphoresis. No rash.   Neurological:  Alert, awake, and appropriate. Normal speech. Motor grossly normal. Cranial Nerves grossly intact by observation.   Psychiatric:  Good eye contact. Normal interaction, affect, and behavior.    MEDICAL DECISION MAKING     PRIMARY PROBLEM LIST     Acute illness/injury DIAGNOSIS:Left sided sciatica  Chronic Illness Impacting Care of the above problem: Cancer and Advanced age Increases complexity of evaluation, Increases the risk of severe disease, and Increase the risk of disease progression  Differential Diagnosis: Back Pain: spinal vertebrae fracture, cauda equina, transverse myelitis, conus medullaris syndrome, musculoskeletal strain, spinal stenosis, osteoarthritis, spinal abscess, IV drug abuse, metastatic disease  DISCUSSION      Patient presents emergency department the chief complaint of left sided back pain radiating to her left leg.  Signs and symptoms appear  to be most consistent with sciatica.  Patient will be given muscle relaxers for management of her symptoms as an outpatient.  She has no urinary difficulty.  She does have some vaginal irritation but UA shows no evidence of UTI.  No vaginal discharge.  labs are grossly unremarkable.  Patient is complaining of breast pain but not chest pain.  Troponin negative x 1 but given the constant nature of the pain I do not feel that further workup is necessary for this particular complaint.  Blood pressure is elevated but patient does have a history of hypertension and is on medication.  Do not feel the patient requires further workup at this time.  Patient has no signs or symptoms at this time consistent with significantly elevated blood pressure.  Patient was given IV fluids and  appropriate pain medication.  She is improving.  Plan is to have patient discharged home and follow-up with her primary care physician.  Overall nontoxic and well-appearing.  Overall improved.  Stable for outpatient management of symptoms.  She agrees with disposition and plan.    If patient is being hospitalized is severe sepsis or septic shock suspected?: N/A          External Records Reviewed?: [place] Prescription Drug Monitor Reviewed, and no concerning prescriptions noted.  Additional Notes                     Vital Signs: Reviewed the patient's vital signs.   Nursing Notes: Reviewed and utilized available nursing notes.  Medical Records Reviewed: Reviewed available past medical records.  Counseling: The emergency provider has spoken with the patient and discussed today's findings, in addition to providing specific details for the plan of care.  Questions are answered and there is agreement with the plan.      MIPS DOCUMENTATION              CARDIAC STUDIES    The following cardiac studies were independently interpreted by me the Emergency Medicine Provider.  For full cardiac study results please see chart.        EKG Interpretation:  Signed  and interpreted by ED Provider   EKG 1  Time of ekg: 10:26 AM  Comparison: 30 October 2022 no significant acute interval changes when compared with previous EKG.  Rate: 54  Rhythm: Sinus bradycardia  ST segments: Nonspecific ST/T wave changes without evidence of ST elevation MI.  Interpretation: Nonspecific  EKG    EMERGENCY IMAGING STUDIES    The following imagine studies were independently interpreted by me (emergency medicine provider):                     RADIOLOGY IMAGING STUDIES      No orders to display       EMERGENCY DEPT. MEDICATIONS      ED Medication Orders (From admission, onward)      Start Ordered     Status Ordering Provider    11/21/22 1202 11/21/22 1201  ketorolac (TORADOL) injection 30 mg  Once        Route: Intravenous  Ordered Dose: 30 mg       Last MAR action: Given Quinlan Mcfall    11/21/22 1202 11/21/22 1201  sodium chloride 0.9 % bolus 1,000 mL  Once        Route: Intravenous  Ordered Dose: 1,000 mL       Last MAR action: New Bag [place]            LABORATORY RESULTS    Ordered and independently interpreted AVAILABLE laboratory tests.   Results       Procedure Component Value Units Date/Time    Urinalysis Reflex to Microscopic Exam- Reflex to Culture [id number] Collected: 11/21/22 1317     Updated: 11/21/22 1328     Urine Type Urine, Clean Ca     Color, UA Straw     Clarity, UA Clear     Specific Gravity UA 1.009     Urine pH 7.5     Leukocyte Esterase, UA Negative     Nitrite, UA Negative     Protein, UR Negative     Glucose, UA Negative     Ketones UA Negative     Urobilinogen, UA Normal mg/dL      Bilirubin,  UA Negative     Blood, UA Negative    High Sensitivity Troponin-I [id number] Collected: 11/21/22 1216     Updated: 11/21/22 1256     hs Troponin-I <2.7 ng/L     Comprehensive metabolic panel [id number]  (Abnormal) Collected: 11/21/22 1101    Specimen: Blood Updated: 11/21/22 1139     Glucose 99 mg/dL      BUN 15.0 mg/dL      Creatinine 1.0 mg/dL      Sodium 146 mEq/L       Potassium 4.2 mEq/L      Chloride 109 mEq/L      CO2 30 mEq/L      Calcium 9.0 mg/dL      Protein, Total 6.7 g/dL      Albumin 3.9 g/dL      AST (SGOT) 39 U/L      ALT 29 U/L      Alkaline Phosphatase 87 U/L      Bilirubin, Total 0.3 mg/dL      Globulin 2.8 g/dL      Albumin/Globulin Ratio 1.4     Anion Gap 7.0     eGFR 57.8 mL/min/1.73 m2     CBC and differential [id number]  (Abnormal) Collected: 11/21/22 1101    Specimen: Blood Updated: 11/21/22 1117     WBC 5.19 x10 3/uL      Hgb 11.9 g/dL      Hematocrit 39.3 %      Platelets 256 x10 3/uL      RBC 4.39 x10 6/uL      MCV 89.5 fL      MCH 27.1 pg      MCHC 30.3 g/dL      RDW 13 %      MPV 11.1 fL      Instrument Absolute Neutrophil Count 2.94 x10 3/uL      Neutrophils 56.6 %      Lymphocytes Automated 33.1 %      Monocytes 6.4 %      Eosinophils Automated 2.9 %      Basophils Automated 0.8 %      Immature Granulocytes 0.2 %      Nucleated RBC 0.0 /100 WBC      Neutrophils Absolute 2.94 x10 3/uL      Lymphocytes Absolute Automated 1.72 x10 3/uL      Monocytes Absolute Automated 0.33 x10 3/uL      Eosinophils Absolute Automated 0.15 x10 3/uL      Basophils Absolute Automated 0.04 x10 3/uL      Immature Granulocytes Absolute 0.01 x10 3/uL      Absolute NRBC 0.00 x10 3/uL               CRITICAL CARE/PROCEDURES    Procedures    DIAGNOSIS      Diagnosis:  Final diagnoses:   Left sided sciatica   Elevated blood pressure reading       Disposition:  ED Disposition       ED Disposition   Discharge    Condition   --    Date/Time   Mon Nov 21, 2022  1:56 PM    Comment   Wannie Balzarini Mcnally discharge to home/self care.    Condition at disposition: Stable                 Prescriptions:  Patient's Medications   New Prescriptions    CYCLOBENZAPRINE (FLEXERIL) 10 MG TABLET    Take 1 tablet (10 mg) by  mouth 3 (three) times daily as needed for Muscle spasms   Previous Medications    AMLODIPINE (NORVASC) 10 MG TABLET    Take 1 tablet (10 mg total) by mouth daily    ANASTROZOLE  (ARIMIDEX) 1 MG TABLET    Take 1 tablet (1 mg total) by mouth every morning    ASPIRIN 81 MG CHEWABLE TABLET    Chew 1 tablet (81 mg total) by mouth daily    CELECOXIB (CELEBREX) 200 MG CAPSULE    Take 1 capsule (200 mg) by mouth daily    DICLOFENAC SODIUM (VOLTAREN) 1 % GEL TOPICAL GEL    Apply 2 g topically 4 (four) times daily    EZETIMIBE (ZETIA) 10 MG TABLET    Take 1 tablet (10 mg) by mouth daily    FERROUS SULFATE (IRON PO)    Take by mouth    FUROSEMIDE (LASIX) 10 MG/ML SOLUTION    Take 1 mL (10 mg) by mouth daily    GABAPENTIN (NEURONTIN) 100 MG CAPSULE    Take 1 capsule (100 mg total) by mouth 2 (two) times daily as needed (Neuropathy)    LEVOTHYROXINE (SYNTHROID) 25 MCG TABLET    Take 1 tablet (25 mcg total) by mouth every morning    LISINOPRIL (ZESTRIL) 40 MG TABLET    Take 1 tablet (40 mg total) by mouth daily    MECLIZINE (ANTIVERT) 25 MG TABLET    Take 1 tablet (25 mg) by mouth 3 (three) times daily as needed for Dizziness    MULTIPLE VITAMINS-MINERALS (MULTIVITAMIN WITH MINERALS) TABLET    Take 1 tablet by mouth every morning    PANTOPRAZOLE (PROTONIX) 40 MG TABLET    Take 1 tablet (40 mg) by mouth daily    POLYETHYLENE GLYCOL (MIRALAX) 17 G PACKET    Take 17 g by mouth daily    ROSUVASTATIN (CRESTOR) 40 MG TABLET    Take 1 tablet (40 mg) by mouth daily   Modified Medications    No medications on file   Discontinued Medications    No medications on file           This note was generated by the Epic EMR system/ Dragon speech recognition and may contain inherent errors or omissions not intended by the user. Grammatical errors, random word insertions, deletions and pronoun errors  are occasional consequences of this technology due to software limitations. Not all errors are caught or corrected. If there are questions or concerns about the content of this note or information contained within the body of this dictation they should be addressed directly with the author for clarification.     Fayrene Fearing, DO  11/21/22 1422

## 2022-11-23 ENCOUNTER — Other Ambulatory Visit: Payer: Self-pay | Admitting: Family

## 2022-11-23 ENCOUNTER — Ambulatory Visit
Admission: RE | Admit: 2022-11-23 | Discharge: 2022-11-23 | Disposition: A | Discharge status: Home / Self Care | Payer: Medicare Other | Source: Ambulatory Visit | Attending: Family | Admitting: Family

## 2022-11-23 DIAGNOSIS — Z1231 Encounter for screening mammogram for malignant neoplasm of breast: Secondary | ICD-10-CM | POA: Insufficient documentation

## 2022-11-23 LAB — ECG 12-LEAD
Atrial Rate: 54 {beats}/min
P Axis: 46 degrees
P-R Interval: 168 ms
Q-T Interval: 428 ms
QRS Duration: 92 ms
QTC Calculation (Bezet): 405 ms
R Axis: 24 degrees
T Axis: 31 degrees
Ventricular Rate: 54 {beats}/min

## 2022-12-01 ENCOUNTER — Encounter: Payer: Self-pay | Admitting: Hematology & Oncology

## 2022-12-06 ENCOUNTER — Telehealth (INDEPENDENT_AMBULATORY_CARE_PROVIDER_SITE_OTHER): Payer: Self-pay

## 2022-12-06 NOTE — Telephone Encounter (Signed)
I spoke with pt who reports left arm weakness with left leg tingling - started last night when she went to sleep. She states she felt the same symptoms when she woke up at 9am this morning. She sat on the bed and drifted to her left side. Pt did not fall. No trauma or injury.    No chest pain, facial droop, speech does not sound slurred over the phone.   She notes that she feels weak and dizzy. She has been off ASA for 2 days. She tells me her symptoms are similar from 2/19 ED visit.     Since pt has active symptoms, I offered to call 911 for her as she does not have anyone at home. She declined and said she will call herself. Explained importance of getting timely evaluation and she verbalized understanding. She will call back post hospital discharge to schedule tests that Dr. Fredia Beets has ordered.

## 2022-12-20 ENCOUNTER — Telehealth (INDEPENDENT_AMBULATORY_CARE_PROVIDER_SITE_OTHER): Payer: Self-pay

## 2022-12-20 NOTE — Telephone Encounter (Signed)
Emily [name] is calling to report left arm tingling and chest pain consistent with what she had at ov w/ Dr Fredia Beets 11/08/22. She is requesting to be seen asap and will not go to the ER. Transferred to [facility] in [place] to arrange OV asap. Pt encouraged to call 911.

## 2022-12-21 ENCOUNTER — Ambulatory Visit (INDEPENDENT_AMBULATORY_CARE_PROVIDER_SITE_OTHER): Payer: Medicare Other | Admitting: Internal Medicine

## 2022-12-29 ENCOUNTER — Encounter (INDEPENDENT_AMBULATORY_CARE_PROVIDER_SITE_OTHER): Payer: Self-pay

## 2023-01-25 ENCOUNTER — Other Ambulatory Visit: Payer: Self-pay | Admitting: Internal Medicine

## 2023-01-25 DIAGNOSIS — M5416 Radiculopathy, lumbar region: Secondary | ICD-10-CM

## 2023-01-25 DIAGNOSIS — M25512 Pain in left shoulder: Secondary | ICD-10-CM

## 2023-01-30 ENCOUNTER — Emergency Department: Payer: Medicare Other

## 2023-01-30 ENCOUNTER — Observation Stay
Admission: EM | Admit: 2023-01-30 | Discharge: 2023-01-31 | Disposition: A | Discharge status: Home / Self Care | Payer: Medicare Other | Attending: Internal Medicine | Admitting: Internal Medicine

## 2023-01-30 DIAGNOSIS — E785 Hyperlipidemia, unspecified: Secondary | ICD-10-CM | POA: Insufficient documentation

## 2023-01-30 DIAGNOSIS — R5381 Other malaise: Secondary | ICD-10-CM | POA: Insufficient documentation

## 2023-01-30 DIAGNOSIS — I10 Essential (primary) hypertension: Secondary | ICD-10-CM

## 2023-01-30 DIAGNOSIS — R42 Dizziness and giddiness: Secondary | ICD-10-CM | POA: Insufficient documentation

## 2023-01-30 DIAGNOSIS — F419 Anxiety disorder, unspecified: Secondary | ICD-10-CM | POA: Insufficient documentation

## 2023-01-30 DIAGNOSIS — I639 Cerebral infarction, unspecified: Secondary | ICD-10-CM

## 2023-01-30 DIAGNOSIS — R200 Anesthesia of skin: Principal | ICD-10-CM | POA: Insufficient documentation

## 2023-01-30 DIAGNOSIS — R001 Bradycardia, unspecified: Secondary | ICD-10-CM

## 2023-01-30 DIAGNOSIS — R079 Chest pain, unspecified: Secondary | ICD-10-CM | POA: Insufficient documentation

## 2023-01-30 DIAGNOSIS — M47812 Spondylosis without myelopathy or radiculopathy, cervical region: Secondary | ICD-10-CM | POA: Insufficient documentation

## 2023-01-30 DIAGNOSIS — I6782 Cerebral ischemia: Secondary | ICD-10-CM | POA: Insufficient documentation

## 2023-01-30 DIAGNOSIS — Z9011 Acquired absence of right breast and nipple: Secondary | ICD-10-CM | POA: Insufficient documentation

## 2023-01-30 DIAGNOSIS — R531 Weakness: Secondary | ICD-10-CM | POA: Insufficient documentation

## 2023-01-30 DIAGNOSIS — Z853 Personal history of malignant neoplasm of breast: Secondary | ICD-10-CM | POA: Insufficient documentation

## 2023-01-30 DIAGNOSIS — I6501 Occlusion and stenosis of right vertebral artery: Secondary | ICD-10-CM | POA: Insufficient documentation

## 2023-01-30 DIAGNOSIS — Z602 Problems related to living alone: Secondary | ICD-10-CM | POA: Insufficient documentation

## 2023-01-30 DIAGNOSIS — R262 Difficulty in walking, not elsewhere classified: Secondary | ICD-10-CM

## 2023-01-30 DIAGNOSIS — E039 Hypothyroidism, unspecified: Secondary | ICD-10-CM | POA: Insufficient documentation

## 2023-01-30 DIAGNOSIS — R0602 Shortness of breath: Secondary | ICD-10-CM | POA: Insufficient documentation

## 2023-01-30 LAB — COMPREHENSIVE METABOLIC PANEL
ALT: 18 U/L (ref 0–55)
AST (SGOT): 26 U/L (ref 5–41)
Albumin/Globulin Ratio: 1.4 (ref 0.9–2.2)
Albumin: 3.6 g/dL (ref 3.5–5.0)
Alkaline Phosphatase: 71 U/L (ref 37–117)
Anion Gap: 6 (ref 5.0–15.0)
BUN: 15 mg/dL (ref 7.0–21.0)
Bilirubin, Total: 0.3 mg/dL (ref 0.2–1.2)
CO2: 30 mEq/L — ABNORMAL HIGH (ref 17–29)
Calcium: 9.4 mg/dL (ref 7.9–10.2)
Chloride: 103 mEq/L (ref 99–111)
Creatinine: 0.8 mg/dL (ref 0.4–1.0)
Globulin: 2.5 g/dL (ref 2.0–3.6)
Glucose: 95 mg/dL (ref 70–100)
Potassium: 4.1 mEq/L (ref 3.5–5.3)
Protein, Total: 6.1 g/dL (ref 6.0–8.3)
Sodium: 139 mEq/L (ref 135–145)
eGFR: >60 mL/min/{1.73_m2} (ref >=60)

## 2023-01-30 LAB — CBC AND DIFFERENTIAL
Absolute NRBC: 0 10*3/uL (ref 0.00–0.00)
Basophils Absolute Automated: 0.03 10*3/uL (ref 0.00–0.08)
Basophils Automated: 0.4 %
Eosinophils Absolute Automated: 0.03 10*3/uL (ref 0.00–0.44)
Eosinophils Automated: 0.4 %
Hematocrit: 36.1 % (ref 34.7–43.7)
Hgb: 11.1 g/dL — ABNORMAL LOW (ref 11.4–14.8)
Immature Granulocytes Absolute: 0.01 10*3/uL (ref 0.00–0.07)
Immature Granulocytes: 0.1 %
Instrument Absolute Neutrophil Count: 4.24 10*3/uL (ref 1.10–6.33)
Lymphocytes Absolute Automated: 2.3 10*3/uL (ref 0.42–3.22)
Lymphocytes Automated: 31.6 %
MCH: 27.3 pg (ref 25.1–33.5)
MCHC: 30.7 g/dL — ABNORMAL LOW (ref 31.5–35.8)
MCV: 88.7 fL (ref 78.0–96.0)
MPV: 11.1 fL (ref 8.9–12.5)
Monocytes Absolute Automated: 0.66 10*3/uL (ref 0.21–0.85)
Monocytes: 9.1 %
Neutrophils Absolute: 4.24 10*3/uL (ref 1.10–6.33)
Neutrophils: 58.4 %
Nucleated RBC: 0 /100 WBC (ref 0.0–0.0)
Platelets: 256 10*3/uL (ref 142–346)
RBC: 4.07 10*6/uL (ref 3.90–5.10)
RDW: 13 % (ref 11–15)
WBC: 7.27 10*3/uL (ref 3.10–9.50)

## 2023-01-30 LAB — URINALYSIS WITH REFLEX TO MICROSCOPIC EXAM - REFLEX TO CULTURE
Bilirubin, UA: NEGATIVE
Blood, UA: NEGATIVE
Glucose, UA: NEGATIVE
Ketones UA: NEGATIVE
Leukocyte Esterase, UA: NEGATIVE
Nitrite, UA: NEGATIVE
Protein, UR: NEGATIVE
Specific Gravity UA: 1.012 (ref 1.001–1.035)
Urine pH: 6.5 (ref 5.0–8.0)
Urobilinogen, UA: NORMAL mg/dL (ref 0.2–2.0)

## 2023-01-30 LAB — PT/INR
PT INR: 1 (ref 0.9–1.1)
PT: 11.7 s (ref 10.1–12.9)

## 2023-01-30 LAB — APTT: PTT: 36 s (ref 27–39)

## 2023-01-30 LAB — HIGH SENSITIVITY TROPONIN-I: hs Troponin-I: <2.7 ng/L

## 2023-01-30 LAB — WHOLE BLOOD GLUCOSE POCT: Whole Blood Glucose POCT: 139 mg/dL — ABNORMAL HIGH (ref 70–100)

## 2023-01-30 MED ORDER — AMLODIPINE BESYLATE 5 MG PO TABS
10.0000 mg | ORAL_TABLET | Freq: Every day | ORAL | Status: DC
Start: 2023-01-31 — End: 2023-01-31
  Administered 2023-01-31: 10 mg via ORAL
  Filled 2023-01-30: qty 2

## 2023-01-30 MED ORDER — GABAPENTIN 100 MG PO CAPS
100.0000 mg | ORAL_CAPSULE | Freq: Two times a day (BID) | ORAL | Status: DC | PRN
Start: 2023-01-30 — End: 2023-01-31

## 2023-01-30 MED ORDER — MELATONIN 3 MG PO TABS
3.0000 mg | ORAL_TABLET | Freq: Every evening | ORAL | Status: DC | PRN
Start: 2023-01-30 — End: 2023-01-31

## 2023-01-30 MED ORDER — DEXTROSE 10 % IV BOLUS
12.5000 g | INTRAVENOUS | Status: DC | PRN
Start: 2023-01-30 — End: 2023-01-31

## 2023-01-30 MED ORDER — ANASTROZOLE 1 MG PO TABS
1.0000 mg | ORAL_TABLET | Freq: Every morning | ORAL | Status: DC
  Administered 2023-01-31: 1 mg via ORAL
  Filled 2023-01-30: qty 1

## 2023-01-30 MED ORDER — ACETAMINOPHEN 325 MG PO TABS
650.0000 mg | ORAL_TABLET | Freq: Three times a day (TID) | ORAL | Status: DC | PRN
Start: 2023-01-30 — End: 2023-01-31

## 2023-01-30 MED ORDER — HYDRALAZINE HCL 20 MG/ML IJ SOLN
10.0000 mg | Freq: Once | INTRAMUSCULAR | Status: AC
Start: 2023-01-30 — End: 2023-01-30
  Administered 2023-01-30: 10 mg via INTRAVENOUS
  Filled 2023-01-30: qty 1

## 2023-01-30 MED ORDER — ROSUVASTATIN CALCIUM 10 MG PO TABS
40.0000 mg | ORAL_TABLET | Freq: Every day | ORAL | Status: DC
Start: 2023-01-31 — End: 2023-01-31
  Administered 2023-01-31: 40 mg via ORAL
  Filled 2023-01-30: qty 4

## 2023-01-30 MED ORDER — PANTOPRAZOLE SODIUM 40 MG PO TBEC
40.0000 mg | DELAYED_RELEASE_TABLET | Freq: Every morning | ORAL | Status: DC
Start: 2023-01-31 — End: 2023-01-31
  Administered 2023-01-31: 40 mg via ORAL
  Filled 2023-01-30: qty 1

## 2023-01-30 MED ORDER — BENZONATATE 100 MG PO CAPS
100.0000 mg | ORAL_CAPSULE | Freq: Three times a day (TID) | ORAL | Status: DC | PRN
Start: 2023-01-30 — End: 2023-01-31

## 2023-01-30 MED ORDER — LEVOTHYROXINE SODIUM 50 MCG PO TABS
25.0000 ug | ORAL_TABLET | Freq: Every day | ORAL | Status: DC
Start: 2023-01-31 — End: 2023-01-31
  Administered 2023-01-31: 25 ug via ORAL
  Filled 2023-01-30: qty 1

## 2023-01-30 MED ORDER — HEPARIN SODIUM (PORCINE) 5000 UNIT/ML IJ SOLN
5000.0000 [IU] | Freq: Three times a day (TID) | INTRAMUSCULAR | Status: DC
Start: 2023-01-30 — End: 2023-01-31
  Administered 2023-01-30 – 2023-01-31 (×2): 5000 [IU] via SUBCUTANEOUS
  Filled 2023-01-30 (×2): qty 1

## 2023-01-30 MED ORDER — DEXTROSE 50 % IV SOLN
12.5000 g | INTRAVENOUS | Status: DC | PRN
Start: 2023-01-30 — End: 2023-01-31

## 2023-01-30 MED ORDER — ASPIRIN 81 MG PO CHEW
81.0000 mg | CHEWABLE_TABLET | Freq: Every day | ORAL | Status: DC
Start: 2023-01-31 — End: 2023-01-31
  Administered 2023-01-31: 81 mg via ORAL
  Filled 2023-01-30: qty 1

## 2023-01-30 MED ORDER — GLUCOSE 40 % PO GEL (WRAP)
15.0000 g | ORAL | Status: DC | PRN
Start: 2023-01-30 — End: 2023-01-31

## 2023-01-30 MED ORDER — LABETALOL HCL 5 MG/ML IV SOLN (WRAP)
20.0000 mg | Freq: Once | INTRAVENOUS | Status: AC
Start: 2023-01-30 — End: 2023-01-30
  Administered 2023-01-30: 20 mg via INTRAVENOUS
  Filled 2023-01-30: qty 4

## 2023-01-30 MED ORDER — CELECOXIB 200 MG PO CAPS
200.0000 mg | ORAL_CAPSULE | Freq: Every day | ORAL | Status: DC
Start: 2023-01-31 — End: 2023-01-31
  Administered 2023-01-31: 200 mg via ORAL
  Filled 2023-01-30: qty 1

## 2023-01-30 MED ORDER — FUROSEMIDE 20 MG PO TABS
10.0000 mg | ORAL_TABLET | Freq: Every day | ORAL | Status: DC
Start: 2023-01-31 — End: 2023-01-31
  Administered 2023-01-31: 10 mg via ORAL
  Filled 2023-01-30 (×2): qty 1

## 2023-01-30 MED ORDER — MECLIZINE HCL 12.5 MG PO TABS
25.0000 mg | ORAL_TABLET | Freq: Three times a day (TID) | ORAL | Status: DC | PRN
Start: 2023-01-30 — End: 2023-01-31

## 2023-01-30 MED ORDER — IOHEXOL 350 MG/ML IV SOLN
120.0000 mL | Freq: Once | INTRAVENOUS | Status: AC | PRN
Start: 2023-01-30 — End: 2023-01-30
  Administered 2023-01-30: 120 mL via INTRAVENOUS

## 2023-01-30 MED ORDER — SALINE SPRAY 0.65 % NA SOLN
2.0000 | NASAL | Status: DC | PRN
Start: 2023-01-30 — End: 2023-01-31

## 2023-01-30 MED ORDER — HYDROCODONE-ACETAMINOPHEN 5-325 MG PO TABS
1.0000 | ORAL_TABLET | Freq: Four times a day (QID) | ORAL | Status: DC | PRN
Start: 2023-01-30 — End: 2023-01-31
  Administered 2023-01-30: 1 via ORAL
  Filled 2023-01-30: qty 1

## 2023-01-30 MED ORDER — LISINOPRIL 20 MG PO TABS
40.0000 mg | ORAL_TABLET | Freq: Every day | ORAL | Status: DC
Start: 2023-01-31 — End: 2023-01-31
  Administered 2023-01-31: 40 mg via ORAL
  Filled 2023-01-30: qty 2

## 2023-01-30 MED ORDER — BENZOCAINE-MENTHOL MT LOZG (WRAP)
1.0000 | LOZENGE | OROMUCOSAL | Status: DC | PRN
Start: 2023-01-30 — End: 2023-01-31

## 2023-01-30 MED ORDER — CARBOXYMETHYLCELLULOSE SOD PF 0.5 % OP SOLN
1.0000 [drp] | Freq: Three times a day (TID) | OPHTHALMIC | Status: DC | PRN
Start: 2023-01-30 — End: 2023-01-31

## 2023-01-30 MED ORDER — POLYETHYLENE GLYCOL 3350 17 G PO PACK
17.0000 g | PACK | Freq: Every day | ORAL | Status: DC
Start: 2023-01-31 — End: 2023-01-31
  Administered 2023-01-31: 17 g via ORAL
  Filled 2023-01-30: qty 1

## 2023-01-30 MED ORDER — EZETIMIBE 10 MG PO TABS
10.0000 mg | ORAL_TABLET | Freq: Every day | ORAL | Status: DC
Start: 2023-01-31 — End: 2023-01-31
  Administered 2023-01-31: 10 mg via ORAL
  Filled 2023-01-30: qty 1

## 2023-01-30 MED ORDER — NALOXONE HCL 0.4 MG/ML IJ SOLN (WRAP)
0.2000 mg | INTRAMUSCULAR | Status: DC | PRN
Start: 2023-01-30 — End: 2023-01-31

## 2023-01-30 MED ORDER — GLUCAGON 1 MG IJ SOLR (WRAP)
1.0000 mg | INTRAMUSCULAR | Status: DC | PRN
Start: 2023-01-30 — End: 2023-01-31

## 2023-01-30 NOTE — ED Provider Notes (Signed)
EMERGENCY DEPARTMENT NOTE     Patient initially seen and examined at   ED PHYSICIAN ASSIGNED       Date/Time Event User Comments    01/30/23 1509 Physician Assigned [name], DO assigned as Attending           ED MIDLEVEL (APP) ASSIGNED       None            HISTORY OF PRESENT ILLNESS   {Translator Used (Optional):59393}    Chief Complaint: Dizziness and Numbness       77 y.o. female with past medical history as below presents to the ED with left facial numbness left arm numbness and left leg numbness and weakness that started at 10 PM last night.  States it continued throughout today.  She complains of dizziness headache chest pain and shortness of breath.  Denies trauma fall or injury.  She took a cab to the ER.  Blood sugar was 139 in the ED    Independent Historian (other than patient): No  Additional History Provided by Independent Historian:  MEDICAL HISTORY     Past Medical History:  Past Medical History:   Diagnosis Date    Breast cancer 2014    right breast mastectomy    Exercise-induced angina     negative work up    GERD (gastroesophageal reflux disease)     Headache     resolved    Hyperlipidemia     Hypertension     Hypothyroidism     Low back pain     Malignant neoplasm of overlapping sites of right female breast 10/19/2015    Syncope and collapse     Chronic Vertigo    Vertigo        Past Surgical History:  Past Surgical History:   Procedure Laterality Date    COLONOSCOPY, DIAGNOSTIC (SCREENING)  2021    COLONOSCOPY, REMOVAL OF TUMOR, POLYP, OR OTHER LESION BY SNARE TECHNIQUE N/A 07/21/2020    Procedure: COLONOSCOPY, POLYPECTOMY;  Surgeon: Einar Grad, MD;  Location: [facility];  Service: Gastroenterology;  Laterality: N/A;    EGD, BIOPSY N/A 07/21/2020    Procedure: EGD, BIOPSY;  Surgeon: Einar Grad, MD;  Location: [facility];  Service: Gastroenterology;  Laterality: N/A;    EXPLORATORY LAPAROTOMY N/A 05/22/2021    Procedure: EXPLORATORY LAPAROTOMY;  Surgeon: Delena Serve, MD;  Location: [facility];  Service: General;  Laterality: N/A;    HYSTERECTOMY      LAPAROSCOPY, DIAGNOSTIC N/A 05/22/2021    Procedure: LAPAROSCOPY, DIAGNOSTIC;  Surgeon: Delena Serve, MD;  Location: [facility];  Service: General;  Laterality: N/A;    LAPAROTOMY, COLECTOMY, RIGHT Right 05/22/2021    Procedure: LAPAROTOMY, COLECTOMY, RIGHT;  Surgeon: Delena Serve, MD;  Location: [facility];  Service: General;  Laterality: Right;    MASTECTOMY Right 2015       Social History:  Social History     Socioeconomic History    Marital status: Widowed   Tobacco Use    Smoking status: Never    Smokeless tobacco: Never   Vaping Use    Vaping status: Never Used   Substance and Sexual Activity    Alcohol use: Yes     Comment: occ    Drug use: No    Sexual activity: Not Currently     Social Determinants of Health     Financial Resource Strain: Low Risk  (  05/12/2022)    Overall Financial Resource Strain (CARDIA)     Difficulty of Paying Living Expenses: Not hard at all   Food Insecurity: No Food Insecurity (01/30/2023)    Hunger Vital Sign     Worried About Running Out of Food in the Last Year: Never true     Ran Out of Food in the Last Year: Never true   Transportation Needs: No Transportation Needs (05/12/2022)    PRAPARE - Therapist, art (Medical): No     Lack of Transportation (Non-Medical): No   Intimate Partner Violence: Not At Risk (11/18/2022)    Humiliation, Afraid, Rape, and Kick questionnaire     Fear of Current or Ex-Partner: No     Emotionally Abused: No     Physically Abused: No     Sexually Abused: No   Housing Stability: Unknown (05/12/2022)    Housing Stability Vital Sign     Unable to Pay for Housing in the Last Year: No     Unstable Housing in the Last Year: No       Family History:  Family History   Problem Relation Age of Onset    Breast cancer Neg Hx        Outpatient Medication:  Previous Medications    AMLODIPINE (NORVASC) 10 MG TABLET    Take 1  tablet (10 mg total) by mouth daily    ANASTROZOLE (ARIMIDEX) 1 MG TABLET    Take 1 tablet (1 mg total) by mouth every morning    ASPIRIN 81 MG CHEWABLE TABLET    Chew 1 tablet (81 mg total) by mouth daily    CELECOXIB (CELEBREX) 200 MG CAPSULE    Take 1 capsule (200 mg) by mouth daily    DICLOFENAC SODIUM (VOLTAREN) 1 % GEL TOPICAL GEL    Apply 2 g topically 4 (four) times daily    EZETIMIBE (ZETIA) 10 MG TABLET    Take 1 tablet (10 mg) by mouth daily    FERROUS SULFATE (IRON PO)    Take by mouth    FUROSEMIDE (LASIX) 10 MG/ML SOLUTION    Take 1 mL (10 mg) by mouth daily    GABAPENTIN (NEURONTIN) 100 MG CAPSULE    Take 1 capsule (100 mg total) by mouth 2 (two) times daily as needed (Neuropathy)    LEVOTHYROXINE (SYNTHROID) 25 MCG TABLET    Take 1 tablet (25 mcg total) by mouth every morning    LISINOPRIL (ZESTRIL) 40 MG TABLET    Take 1 tablet (40 mg total) by mouth daily    MECLIZINE (ANTIVERT) 25 MG TABLET    Take 1 tablet (25 mg) by mouth 3 (three) times daily as needed for Dizziness    MULTIPLE VITAMINS-MINERALS (MULTIVITAMIN WITH MINERALS) TABLET    Take 1 tablet by mouth every morning    PANTOPRAZOLE (PROTONIX) 40 MG TABLET    Take 1 tablet (40 mg) by mouth daily    POLYETHYLENE GLYCOL (MIRALAX) 17 G PACKET    Take 17 g by mouth daily    ROSUVASTATIN (CRESTOR) 40 MG TABLET    Take 1 tablet (40 mg) by mouth daily         REVIEW OF SYSTEMS   Review of Systems See History of Present Illness  PHYSICAL EXAM     ED Triage Vitals   Enc Vitals Group      BP 01/30/23 1508 155/67      Heart Rate 01/30/23 1508 66  Resp Rate 01/30/23 1508 20      Temp 01/30/23 1501 97.7 F (36.5 C)      Temp Source 01/30/23 1501 Temporal      SpO2 01/30/23 1508 99 %      Weight --       Height --       Head Circumference --       Peak Flow --       Pain Score --       Pain Loc --       Pain Edu? --       Excl. in GC? --      Physical Exam   Nursing note and vitals reviewed.  Constitutional:  Well developed, well nourished. Awake &  Oriented x3.  Head:  Atraumatic. Normocephalic.    Eyes:  PERRL. EOMI. Conjunctivae are not pale.  ENT:  Mucous membranes are moist and intact. Oropharynx is clear and symmetric.  Patent airway.  Neck:  Supple. Full ROM.    Cardiovascular:  Regular rate. Regular rhythm. No murmurs, rubs, or gallops.  Pulmonary/Chest:  No evidence of respiratory distress. Clear to auscultation bilaterally.  No wheezing, rales or rhonchi.  Right mastectomy  Abdominal:  Soft and non-distended. There is no tenderness. No rebound, guarding, or rigidity.  Back:  Full ROM. Nontender.  Extremities:  No edema.Full range of motion in all extremities.    Skin:  Skin is warm and dry.  No diaphoresis. No rash.   Neurological:  Alert, awake, and appropriate. Normal speech. Motor normal. No pronator drift. Face symmetric. + 5/ 5 muscle strength in upper and lower extremities bilaterally    Psychiatric:  Good eye contact. Normal interaction, affect, and behavior.    MEDICAL DECISION MAKING   Pt given IV fluids for hydration  CXR to r/o pneumonia  Troponin to r/o ischemia    Ct head to r/o sdh    Ct angio head and neck and ct perfusion to r/o stroke    Work up negative    Case discussed with Dr Francesco Sor- neurology on call who states will be on consult if pt is admitted    Pt has had multiple admissions for the same this past year.     CBC CMP negative CT head angio head and neck and CT perfusion negative, EKG negative, troponin negative, chest x-ray negative, UA negative    Results discussed in length with patient  Will discharge home     Patient refusing to be discharged.  I discussed case in length with patient's son and daughter-in-law who live in [place].  Patient's son Tamsen Meek [phone]        PRIMARY PROBLEM LIST      {CEP ACUITY:59028} DIAGNOSIS:***  {Chronic Illness Impacting Care of the above problem:59030} {Explain (Optional):59078}  {Differential Diagnosis:59053}    DISCUSSION      ***    {If patient is being hospitalized  is severe sepsis or septic shock suspected?:59467}      {Was management discussed with a consultant?:59037}  {Was the decision around the need for surgery discussed with consultant:59056::"N/A"}  {External Records Reviewed?:59023}    Additional Notes    {Diagnostic test considered and not performed:59031::"N/A"}  {Prescription medications considered and not given:59033::"N/A"}  {Hospitalization considered but not done:59032::"N/A"}  {Social Determinants of Health Considerations:59036::"N/A"}  {Was there decision to not resuscitate or to de-escalate care due to poor prognosis?:59057}     NIH Stroke Score      Flowsheet Row Most Recent  Value   NIH Stroke Scale    Interval Baseline admission to ED   1a. Level of Consciousness 0   1b. LOC Questions (age, month) 2  [Pt stated that it is May and that she's [age]yrs old]   1c. LOC Commands (Open and close eyes, Grip AND release good hand) 0   2. Best Gaze 0   3. Visual Fields 0  [pt unable to answer question. Pt repats that she needs new prescription glasses and needs Visine eye drops.]   4. Facial Palsy 0   5a. Motor Left Arm: (Arms with palm down X 10 seconds. Sitting = arms at 90 degrees. Supine = arms 45 degrees) 0   5b. Motor Right Arm: (Arms with palm down X 10 seconds. Sitting = arms at 90 degrees Supine = arms 45 degrees) 0   Total Motor Arms 0   6a. Motor Left Leg: (Leg elevated X 5 seconds Supine = Leg 30 degrees) 0   6b. Motor Right Leg: (Leg elevated X 5 seconds Supine = Leg 30 degrees) 0   Total Motor Legs 0   7. Limb Ataxia (Finger to nose, heel to shin) 0   8. Sensory (Sensation or grimace to pin prick on face, arm, trunk, and leg) 1  [rough right side of face, pinching on right arm and tickling on right leg]   9. Best language (Describe picture, name items, read sentences from NIHSS booklet) 0   10. Dysarthria (Read list from NIHSS booklet) 0   11. Extinction and Inattention (formerly Neglect) - Test tactile and visual stimulation 0   NIHSS Total 3           Alteplase Contraindications:      Flowsheet Row Most Recent Value   Class III Thrombolytic Exclusion Criteria: do NOT give thrombolytic    Onset time greater than 3 or 4.5 hours Yes   Class IIb Thrombolytic Exclusion Criteria: thrombolytic may be considered only in carefully selected patients with these conditions             Vital Signs: Reviewed the patient's vital signs.   Nursing Notes: Reviewed and utilized available nursing notes.  Medical Records Reviewed: Reviewed available past medical records.  Counseling: The emergency provider has spoken with the patient and discussed today's findings, in addition to providing specific details for the plan of care.  Questions are answered and there is agreement with the plan.      MIPS DOCUMENTATION    {ORAL ANTIBIOTICS given for otitis externa due to:59079}  {ACUTE BRONCHITIS Medical reasoning for giving this patient oral antibiotics for acute bronchitis are the following (Optional):59080}  {Medical reasoning for giving this patient oral antibiotics for acute sinusitis are the following (Optional):59081}  {HEAD TRAUMA Indications for head CT due to trauma (Optional):59082}    CARDIAC STUDIES    The following cardiac studies were independently interpreted by me the Emergency Medicine Provider.  For full cardiac study results please see chart.    {Monitor Strip Interpretation:59688}  {ZOXW:96045}  {Rhythm:59687}  {ST segments:59689}    {EKG interpretation:59684}  {Comparison:59859}  {Time:64077}  {Rate:59685}  {Rhythm:59687}  {ST segments:59689}  {STEMI?:64073}  {EKG interpretation:59690}    {EKG interpretation:59684}  {Comparison:59859}  {Time:64077}  {Rate:59685}  {Rhythm:59687}  {ST segments:59689}  {STEMI?:64073}  {EKG interpretation:59690}  EMERGENCY IMAGING STUDIES    The following imagine studies were independently interpreted by me (emergency medicine provider):    {Xray interpreted by ED provider? (Optional):59468} {SIDE  (Optional):59475}  {Comparison:59859}  [id number]  {  IMPRESSION:59470}    {CT interpreted by provider? (Optional):59471}  {Comparison:59859}  {ZOXWRU:04540}  {IMPRESSION:59474}  RADIOLOGY IMAGING STUDIES      CTA  Head & Neck   Final Result      1.Normal CT perfusion examination of the head. There is no territorial   perfusion abnormality.   2.There is no stenosis of the proximal right internal carotid artery based   on NASCET criteria.     3.There is no stenosis of the proximal left internal carotid artery based   on NASCET criteria.   4.The vertebrobasilar arterial system is patent. Unchanged mild stenosis of   the right cervical vertebral artery at the C4-C5 level due to surrounding   degenerative changes of the cervical spine.    5.Normal intracranial CT angiogram. There is no proximal intracranial large   vessel occlusion.   6.Advanced multilevel degenerative changes of the cervical spine.      Milan Waunita Schooner, MD   01/30/2023 4:16 PM      Chest  AP Portable   Final Result      No acute thoracic abnormality.      Nonda Lou, MD   01/30/2023 4:23 PM      CT Perfusion Brain   Final Result      1.Normal CT perfusion examination of the head. There is no territorial   perfusion abnormality.   2.There is no stenosis of the proximal right internal carotid artery based   on NASCET criteria.     3.There is no stenosis of the proximal left internal carotid artery based   on NASCET criteria.   4.The vertebrobasilar arterial system is patent. Unchanged mild stenosis of   the right cervical vertebral artery at the C4-C5 level due to surrounding   degenerative changes of the cervical spine.    5.Normal intracranial CT angiogram. There is no proximal intracranial large   vessel occlusion.   6.Advanced multilevel degenerative changes of the cervical spine.      Milan Waunita Schooner, MD   01/30/2023 4:16 PM      CT Head WO Contrast   Final Result       1.No CT evidence of acute territorial infarction or  hemorrhage.   2.Mild chronic appearing microvascular ischemic changes.      Garnette Gunner, MD   01/30/2023 3:24 PM          EMERGENCY DEPT. MEDICATIONS      ED Medication Orders (From admission, onward)      Start Ordered     Status Ordering Provider    01/30/23 1738 01/30/23 1737  hydrALAZINE (APRESOLINE) injection 10 mg  Once        Route: Intravenous  Ordered Dose: 10 mg       Last MAR action: Given Loetta Rough    01/30/23 1623 01/30/23 1622  hydrALAZINE (APRESOLINE) injection 10 mg  Once        Route: Intravenous  Ordered Dose: 10 mg       Last MAR action: Given Loetta Rough    01/30/23 1521 01/30/23 1521  iohexol (OMNIPAQUE) 350 MG/ML injection 120 mL  IMG once as needed        Route: Intravenous  Ordered Dose: 120 mL       Last MAR action: Imaging Agent Given Elie Goody G            LABORATORY RESULTS    Ordered and independently interpreted AVAILABLE laboratory tests.   Results  Procedure Component Value Units Date/Time    Urinalysis Reflex to Microscopic Exam- Reflex to Culture [[id number]] Collected: 01/30/23 1701    Specimen: Urine Updated: 01/30/23 1721     Urine Type Urine, Clean Ca     Color, UA Straw     Clarity, UA Clear     Specific Gravity UA 1.012     Urine pH 6.5     Leukocyte Esterase, UA Negative     Nitrite, UA Negative     Protein, UR Negative     Glucose, UA Negative     Ketones UA Negative     Urobilinogen, UA Normal mg/dL      Bilirubin, UA Negative     Blood, UA Negative    High Sensitivity Troponin-I [[id number]] Collected: 01/30/23 1556    Specimen: Blood Updated: 01/30/23 1641     hs Troponin-I <2.7 ng/L     Comprehensive metabolic panel [[id number]]  (Abnormal) Collected: 01/30/23 1556    Specimen: Blood Updated: 01/30/23 1634     Glucose 95 mg/dL      BUN 09.3 mg/dL      Creatinine 0.8 mg/dL      Sodium 235 mEq/L      Potassium 4.1 mEq/L      Chloride 103 mEq/L      CO2 30 mEq/L      Calcium 9.4 mg/dL      Protein, Total 6.1 g/dL      Albumin 3.6 g/dL      AST (SGOT) 26 U/L       ALT 18 U/L      Alkaline Phosphatase 71 U/L      Bilirubin, Total 0.3 mg/dL      Globulin 2.5 g/dL      Albumin/Globulin Ratio 1.4     Anion Gap 6.0     eGFR >60.0 mL/min/1.73 m2     Prothrombin time/INR [[id number]] Collected: 01/30/23 1556    Specimen: Blood Updated: 01/30/23 1622     PT 11.7 sec      PT INR 1.0    APTT [[id number]] Collected: 01/30/23 1556     Updated: 01/30/23 1622     PTT 36 sec     CBC and differential [[id number]]  (Abnormal) Collected: 01/30/23 1556    Specimen: Blood Updated: 01/30/23 1617     WBC 7.27 x10 3/uL      Hgb 11.1 g/dL      Hematocrit 07.3 %      Platelets 256 x10 3/uL      RBC 4.07 x10 6/uL      MCV 88.7 fL      MCH 27.3 pg      MCHC 30.7 g/dL      RDW 13 %      MPV 11.1 fL      Instrument Absolute Neutrophil Count 4.24 x10 3/uL      Neutrophils 58.4 %      Lymphocytes Automated 31.6 %      Monocytes 9.1 %      Eosinophils Automated 0.4 %      Basophils Automated 0.4 %      Immature Granulocytes 0.1 %      Nucleated RBC 0.0 /100 WBC      Neutrophils Absolute 4.24 x10 3/uL      Lymphocytes Absolute Automated 2.30 x10 3/uL      Monocytes Absolute Automated 0.66 x10 3/uL      Eosinophils Absolute Automated 0.03 x10 3/uL      Basophils  Absolute Automated 0.03 x10 3/uL      Immature Granulocytes Absolute 0.01 x10 3/uL      Absolute NRBC 0.00 x10 3/uL     Glucose Whole Blood - POCT [[id number]]  (Abnormal) Collected: 01/30/23 1507     Updated: 01/30/23 1548     Whole Blood Glucose POCT 139 mg/dL               CRITICAL CARE/PROCEDURES    Procedures  ***Critical care?  DIAGNOSIS      Diagnosis:  Final diagnoses:   Left sided numbness   Elevated blood pressure reading with diagnosis of hypertension       Disposition:  ED Disposition       ED Disposition   Discharge    Condition   --    Date/Time   Mon Jan 30, 2023  6:22 PM    Comment   Emily [name] discharge to home/self care.    Condition at disposition: Stable                 Prescriptions:  Patient's Medications   New  Prescriptions    No medications on file   Previous Medications    AMLODIPINE (NORVASC) 10 MG TABLET    Take 1 tablet (10 mg total) by mouth daily    ANASTROZOLE (ARIMIDEX) 1 MG TABLET    Take 1 tablet (1 mg total) by mouth every morning    ASPIRIN 81 MG CHEWABLE TABLET    Chew 1 tablet (81 mg total) by mouth daily    CELECOXIB (CELEBREX) 200 MG CAPSULE    Take 1 capsule (200 mg) by mouth daily    DICLOFENAC SODIUM (VOLTAREN) 1 % GEL TOPICAL GEL    Apply 2 g topically 4 (four) times daily    EZETIMIBE (ZETIA) 10 MG TABLET    Take 1 tablet (10 mg) by mouth daily    FERROUS SULFATE (IRON PO)    Take by mouth    FUROSEMIDE (LASIX) 10 MG/ML SOLUTION    Take 1 mL (10 mg) by mouth daily    GABAPENTIN (NEURONTIN) 100 MG CAPSULE    Take 1 capsule (100 mg total) by mouth 2 (two) times daily as needed (Neuropathy)    LEVOTHYROXINE (SYNTHROID) 25 MCG TABLET    Take 1 tablet (25 mcg total) by mouth every morning    LISINOPRIL (ZESTRIL) 40 MG TABLET    Take 1 tablet (40 mg total) by mouth daily    MECLIZINE (ANTIVERT) 25 MG TABLET    Take 1 tablet (25 mg) by mouth 3 (three) times daily as needed for Dizziness    MULTIPLE VITAMINS-MINERALS (MULTIVITAMIN WITH MINERALS) TABLET    Take 1 tablet by mouth every morning    PANTOPRAZOLE (PROTONIX) 40 MG TABLET    Take 1 tablet (40 mg) by mouth daily    POLYETHYLENE GLYCOL (MIRALAX) 17 G PACKET    Take 17 g by mouth daily    ROSUVASTATIN (CRESTOR) 40 MG TABLET    Take 1 tablet (40 mg) by mouth daily   Modified Medications    No medications on file   Discontinued Medications    No medications on file           This note was generated by the Epic EMR system/ Dragon speech recognition and may contain inherent errors or omissions not intended by the user. Grammatical errors, random word insertions, deletions and pronoun errors  are occasional consequences of this technology due to software  limitations. Not all errors are caught or corrected. If there are questions or concerns about the  content of this note or information contained within the body of this dictation they should be addressed directly with the author for clarification.    {THIS REVIEW SECTION WILL AUTODELETE ONCE NOTE IS SIGNED    END REVIEW SECTION(Optional):55325}

## 2023-01-30 NOTE — ED Notes (Signed)
Pt told that she would be discharged home. Pt walked outside pt room and yelled for help that she was going to fall and exhibited an unsteady stance. Pt walked from bedside commode to doorway and waited for staff to arrive at her side to help her into bed. Pt was reminded to use her call bell that was located next to the bedside commode, on her bed.

## 2023-01-30 NOTE — Discharge Instructions (Addendum)
____________________________    Resources for Seniors       SSA maintains a comprehensive list of website links to nonprofit organizations and government programs that provide seniors and caregivers needed resources on a variety of topics including where to find information on: caregiver support, volunteer / employment opportunities, housing, education, technology, transportation, legal resources, and much more!      Dementia Programs    [facility] [place] provides a safe and supportive environment for older adults and respite for their caregivers.  Fees are on a sliding scale based on income level.    [facility] is a wonderful non-profit, based in [place], that provides many supportive services (some on-line) for families of those living with dementia.    [place] [address] Map:  A Guide for People Impacted by Dementia  This new resource takes people stage by stage through the changes resulting from cognitive decline and dementia, highlighting some things that can be expected and things to do at each stage, and includes practical, concrete Action Steps to be taken at each stage.  The Road Map also includes information on caregiver selfcare and a list of resources. Click on https://vda.[place].gov/dementia.htm.    AARP Dementia and Caregiver Support Resources:  AARP Resources for Caregivers and their Families  https://www.aarp.org/health/dementia/    Division on Aging and Adult Services  www.[place].gov/aging - The Division on Aging and Adult Services (DAAS) serves [place]'s seniors and adults with resources ranging from caregiving, delivered food through Meals on Wheels operated by [facility] [place], employment and volunteer opportunities, financial assistance, housing, in-home services, recreation, and transportation to help citizens remain independent. DAAS also has a Directory of Resources which you can download by clicking here.  ([phone] [phone]    [place]  Council of Human Services Organizations (ACHSO)  www.[place]ri[place].gov/ACHSO - The Index Council of Human Services Organizations, or ACHSO, released a needs assessment report this week identifying 5 Key Issues affecting the quality of our human services network in the city. Those issues include Affordable Housing, Early Intervention Services for Youth, The Information Gap, The Eligibility Gap, and Nonprofit Advocacy. Meeting Needs Today provides a snapshot of the state of [place]'s human services system - including services, service gaps, and service improvements - since the last needs assessment in 2008. [facility] [place] is a member of ACHSO and participated in the development of the report, which is available online at the web address above.    [place] [facility]  [facility] - 703-751-2766  [facility] - 703-746-5456    [place] Department for the Aging  www.vda.[place].gov - The  Department for the Aging (VDA) works with 25 local Area Agencies on Aging (AAA) as well as various other public and private organizations to help older [place] and their families find the services and information they need. The Department operates the Center for Elder Rights, which is a central point of contact for older [place]ians to access information and services.    Senior Housing  [place].gov Renter Resources   Consider Homesharing as a First Step- http://www.silvernesthome.com/[place]    Help with Paying Bills  RAMP- RAMP Brochure - 2019RAMP is a grant program which modifies rental units for tenants with mobility impairments. Typical improvements include grab bars, bathroom renovations like the installation of walk-in showers and comfort height toilets, and exterior modifications like ramps, shallow stairs, and handicapped parking spaces. These are done at no cost to the tenant or landlord with the   only stipulation being  the landlord retain the improvements for five years and rent the unit to a mobility-impaired tenant during that time.    HRLP-This program provides no-interest, deferred payment loans for the design and construction of home improvements to help low- and moderate-income City residents remain in their homes. The program serves owner occupants with combined incomes below 80% of the area median income by family size (which ranged from $70,560 for a household of one to $108,880 for a household of five in 2020). Eligible households may receive loans of up to $135,000 for construction costs, plus additional funds for related costs to improve the safety, accessibility, and quality of their homes. Repayment of the loans is deferred for 99 years or until the property is sold or the owner(s) move, whichever comes first. To learn more, contact Arthur Thomas at [phone]  Home Safety Checklist for Wheelchair Users - https://uk.rs-online.com/web/generalDisplay.html?id=/home-safety-checklist-wheelchair-users      Senior Navigator (www.seniornavigator.org) - Senior Navigator is a nonprofit organization that provides free information about resources related to health and aging. These resources include: financial and legal matters, health facilities, assisted living and housing, exercise programs, support groups, and more. The site is searchable by ZIP code to identify resources available by community.  [address], [address]  [place], [place] [address]  Toll Free phone number: ([phone]    Guide to Retirement Living (https://www.retirementlivingsourcebook.comwww.retirement-living.com) - The Source Book magazine list senior housing options, including independent, assisted living, nursing facilities, home care agencies, and other care resources.  (866) 393-0957    SeniorHomes.com (www.seniorhomes.com) - Free directory of Senior Living communities in the [place]. The directory has a rich listing of Senior Living  Communities grouped by region and type (independent living, assisted living, care homes, Alzheimer's care, and nursing homes). Additionally, the website also offers rich and detailed information about each type of Senior Living community - useful to families who are deciding what is best for their parents/grandparents.    Transportation    Senior Taxi - Reservations for [place] Senior Taxi service should be made directly with Yellow Cab. In order to obtain a discounted fare, Seniors must enroll for the new program directly with the City of [place]'s Division on Aging and Adult Services at ([phone] (then press 1) to obtain the new Senior Taxi Yellow Card. If you do not have a Senior Taxi Yellow Card you will pay full fare for taxi service.    DOT Transportation for Disabled - SSA continues to manage reservations for the DOT Paratransit transportation program which may be reached at ([phone].    [place] GrandDriver - https://www.granddriver.net/#  This initiative aimed at helping older [place]ians on staying safe on the road.    Continuing Education Opportunities      At Home in [place] - www.athomeinalexandria.org    Volunteer [place] - www.volunteeralexandria.org    Caregiver Support    Caring Bridge - www.caringbridge.org    [place] Helping Hands - www.lotsahelpinghands.com    Meal Train - www.mealtrain.com    Drop in Grief Support Group (Sponsored by [facility]) - https://www.capitalcaring.org/grief-support-in-[place]/    City of [place] Caregiver Support Group - Caregiver Support Group Flyer  Caregiver Support Group- Caregiver Support Group 2020    Legal Information    Legal Services of [place]  Statewide Senior Legal Help Line - 844-803-5910     The [place] [employer]'s statewide Senior Legal Helpline is now up and running.  The helpline is made possible through a contract between   [employer] and the Department for Aging and Rehabilitative Services, with funding from a 3-year Model  Approaches to Statewide Legal Assistance Systems grant from the Administration on Community Living. The toll-free number (844-802-5910) is now available for [place]ians aged 60 and older and for those calling on their behalf.  We will seek to advise seniors on the following topics:  long term care issues, public benefits (including Medicaid, SSI and Social Security, Medicare), guardianship and alternatives to guardianship, financial exploitation, adult abuse and neglect, age discrimination, and some limited consumer issues.    Recreation    [place] Department of Parks & Recreation - www.alexandriava.gov/recreation    [facility] [address] - www.ymcadc.org  Scams    Common Fraud Schemes - www.fbi.gov/scams-safety/fraud    Phone Scams  Consumer Information - www.consumer.ftc.gov/articles/0076-phone-scams    Guide to Preventing Home Improvement Fraud - www.homeadvisor.com/article.show.Preventing-Home-Improvement-Fraud.17322.html    Guide to Shopping Online Safely - www.onguardonline.gov/articles/0020-shopping-online    International Lottery Scams - www.consumer.ftc.gov/articles/0086-international-lottery-scams    Top 10 Scams Targeting Seniors - www.ncoa.org/enhance-economic-security/economic-security-Initiative/savvy-saving-seniors/top-10-scams-targeting.html    For more information on scams check out SSA's senior scams webpage!    Area on Aging:     Provides Case Management services, Disease Prevention and Health promotion, Home Delivered Meals, Information and Assistance, [place] Long-Term Ombudsman Program, Tax Preparation Assistance, and [place] Insurance Counseling and Assistance Program     Aging Programs and Services [place], [place]   703-777-0257  TTY [phone]  [facility] [place], [place]   703-430-2397  [facility] [place], [place] [phone]  [place] [facility] [place], [place]  703-737-8039  Adult Day Care Center [place],  [place]   [phone]  Adult Day Care Center [place], [place]   571-258-3402 or [phone]    Other Day Programs:   [facility]     703-204-4664    Life Line:   [place] Lifeline        800-LIFELINE (543-3546)  Link to Life        800-338-4176  Nova Medical Alert ([place])  571-283-1328 or novamedicalalert.com  Valued Relationship Inc (VRI)    800-921-2008    Secretary Information Line for Health and Human Services  2-1-1    VICAP ([place] Insurance Counseling and Assistance Program)  800-552-3402    [place] Health Insurance Exchange      800-318-2596 or https://localhelp.healthcare.gov/    Retirement Living Source Book     703-992-1102 or 800-394-9990 x1102   Call for your FREE guide on Nursing Homes, Assisted Living, Retirement Homes, Day Cares, and other Elderly Resources    Resource Placement/Finding Agency (No Cost)  A Family Tie     240-778-9920  A Place for Mom    913-800-6336  [facility] Be There     240-462-8027    [place] Social Services  703-777-0353  [place] Social Services 703-324-7450  [place] Social Services  540-955-3700    Alzheimer's Association   800-272-3900  Medicare     800-MEDICARE (633-4227)  TTY 877-486-2048  Social Security    800-772-1213    Transportation:   [place] [employer] Bus Services (Need 48 hours notice)    877-777-2708  [place] Volunteer Caregivers (Medical transport for frail elderly and disabled)  703-779-8617  [employer] (Medicaid Recipients ONLY)      866-679-6330  AAA transport (Stretcher and Wheelchair transport call for rates)   301-952-1193 option 2  H&M transport (Wheelchair transport call for rates)     703-304-7889  Home Helpers (Companion Transports Call for rates)       Humble Transport                                                                                           [phone]     PRIMARY CARE PHYSICIANS that do HOUSECALLS:   [facility] PCP   ([phone]    [facility]    [phone]    HouseCalls     ([phone]    Need to check zipcode  eligibility    Need to be >24 years old     Check Insurance accepted    ____________________________________________________________    How to apply for Medicaid  Apply in one of these ways:    Apply online at [email or web address]    Call the Cover [facility] Monday through Friday, 8 a.m. to 7 p.m. and Saturday 9 a.m. to 12 p.m. at 833-5CALLVA (TDD: [phone]).    Apply online at the [employer] at Sun[employer].ScottsdaleCommunities.com.ee.    Mail or drop off a paper application to your local Department of Social Services (DSS) ([PDF]English application [PDF]Spanish application). Mailing may take longer than other ways of applying. To find your local DSS, go to dss.PublicityAid.at.    If you also want to apply for other benefits, you can apply online at [email or web address]://[email or web address]/, or call the [place] Department of Social [employer] at [phone].    Application Assisters  If you need help with your application, find an Application Assister.    How to send information we ask for  We will send you a letter if we need more information. You can send your information to [place] in one of these ways:    Online: [email or web address]  Email: [email or web address]  You can scan and upload, attach electronic copies, or take a photo of your information and attach it to the email. To keep your information secure, this email address only receives information. You will not get a reply.    Mail: Use the address on the letter we send asking for information.  Fax: [phone]  For questions, call Cover [place] at 833-5CALLVA.    Medicaid for Pregnant Women  When you are enrolled in Medicaid for pregnant members, you get comprehensive health care benefits during your pregnancy and for one full year following your baby's birth. Medicaid may provide up to 3 months coverage prior to the date you apply. You also receive dental benefits during your pregnancy and  postpartum. Dental services are administered through the Smiles For Children program. Doula services are also now available to pregnant and postpartum members.    Please visit CoverVA or Cubre[place] for more information on Medicaid for Pregnant Individuals.    Please contact the Smiles for Children program at [phone] for more information on dental services for pregnant members.    How Do I Switch My Medicaid Coverage From One State to Another?  Because each state has its own Medicaid eligibility requirements, there is no option that allows you to switch Medicaid coverage from one state to another. Therefore, you should reapply for Medicaid in the state where you're planning to live.    Keep in mind that while you may qualify for  Medicaid coverage in one state, you might not qualify in another, so it's important to research eligibility requirements before you move. Additionally, you cannot be eligible to receive Medicaid benefits in two states at the same time.    You will therefore need to terminate your original Medicaid coverage before applying for Medicaid in your new state.     How Long Does It Take to Transfer Medicaid to Another State?  How long it takes to switch state Medicaid coverage depends on the state in which you're applying for coverage. After submitting an application, it usually takes between 15 and 90 days to receive a letter of approval.    Most states offer retroactive Medicaid coverage, meaning you can receive coverage for medical services prior to the date of your application's approval. However, you may need to pay for services out of pocket until the retroactive coverage begins.    For this reason, it's important to keep all the medical bills and receipts that you receive while you are waiting for Medicaid approval.    Information for Noncitizens    New Health Coverage for [employer]  In the past, Green Nurse, adult (also called lawful permanent residents) with five years or  more of residency in the [place] States had to show proof of 40 quarters (10 years) of work history to qualify for health coverage from [place]. This work requirement ended on January 02, 2020.    Green Nurse, adult (lawful permanent residents) with five years or more of residency in the [place] may now be eligible for free or low-cost health coverage from [place]. The five years starts from the date you became a permanent resident, as shown on your immigration documentation.    There are other ways to get health coverage including for asylees, refugees, legally residing children, pregnant women, immigrants and other qualified non-citizens needing emergency care.?It is important to apply to see if you qualify!    New Prenatal Coverage  New prenatal coverage information is now available for you and your family! FAMIS Prenatal Coverage is full coverage for pregnant women who do not meet immigration status rules for other coverage.    You now do not need to meet immigration status rules, provide immigration documents, or have a Social Security number to be eligible for [facility] Prenatal Coverage. You do need to provide the estimated date of your delivery and how many children you are expecting, live in [place], meet income rules, and be uninsured.    Apply online at www.http://donovan-[email or web address]/ or call the Cover [facility] at 1-833-5CALLVA (TDD: [phone]) to receive comprehensive medical coverage including doctor visits, prescription medication, your hospital stay for the birth, and more!    Learn More: Visit CoverVA.org or Cubre[place].org    Medicaid Waiver Program    The Medicaid waivers are home and community based offering supports and services to a Medicaid individual, both children and adults. The waiver services will differ based on individual need and program criteria met.    For anyone interested in applying for one of the DD (Developmental Disability) waivers, contact your  local Community Services Board to inquire further.    For anyone interested in obtaining the [facility] Plus waiver, the following action steps must occur:    The individual may request a LTSS Screening through their local Dept. of Social Services. A Land (i.e.: Company secretary. Nurse), will meet with the individual and a family member/caregiver, if available, to  assess the individual's need for the [facility] Plus waiver. If an individual is hospitalized and needs a screening, this can be done in the hospital by a discharge planner; and  The individual must be eligible for Medicaid in order to receive and have waiver services paid by Medicaid.    The individual may:  Complete a Human resources officer at http://donovan-tate.com/,  Call the [facility] at 1-833-5CALLVA (([phone], TDD: [phone]) to complete the application over the phone, or  Contact their local Dept. of Social Services agency to request a Medicaid paper application and request the Appendix D, which is specific to the long-term services and supports.  Once approved for Medicaid eligibility, screened and approved for the [facility] Plus waiver, the individual may choose the desired waiver services. The screening team will assist the individual with locating providers in their area.   _____________________________________________________________________________________________    Private Hire Caregivers    [facility] [phone] WealthyDonor.cz     [facility]  [phone] https://[email or web address]/    (Coverage in includes all of [place], [place] and [place] and [place])    [employer]   [phone]   https://capitalcitynurses.com/  Visiting [place]     330-031-6810 https://www.[email or web address]/  Home Instead   2268332360    https://www.homeinstead.com/                587-689-6412   The Medical Team                320-750-1958 https://www.hill.org/  [facility]                [phone] http://www.gregory-burns.com/  Right at Home               [phone] WirelessMortgages.dk  Thrive at Home                [phone] https://thriveathome.com/  Care People                  [phone] ResellerReport.com.cy  [facility]               6151530081 InternetActor.at  [facility]                     [phone] https://www.warmanhomecare.com/  [facility]                             [phone] https://www.brightstarcare.com/  LifeMatters                [phone] SharpAnalyst.uy  Care With Love               [phone] http://huff.com/  Comfort [facility]                          [phone]    CommodityPost.es.com/  [phone]  Home Helpers           [phone] https://www.[email or web address]/   810-427-2394    Home Care                        [phone]   http://www.johnson-munoz.net/    Elder Options          [phone] https://elderoptionsseniorcare.com/          Medicaid Waiver [employer]  [phone] https://www.capitalhomecare.com/  [facility]  2390169816 https://oasiscaregivers.com/  The Medical Team  [phone] https://www.hill.org/  [facility] [phone] CaymanRegister.uy      [phone]  [facility]  [phone] FlavorBlog.is  [facility]  [phone] http://www.gregory-burns.com/  [facility]  7810397152 https://www.figueroa.com/  [facility] [phone] TwinGame.no  Care People   [phone] ResellerReport.com.cy  [facility] [phone] http://www.assuredhealthhomecare.com/  [facility]   [phone] HandymanRating.si

## 2023-01-30 NOTE — Progress Notes (Addendum)
4 eyes in 4 hours pressure injury assessment note:      Completed with: NIKKI @6b   Unit & Time admitted: 2120             Bony Prominences: Check appropriate box; if wound is present enter wound assessment in LDA     Occiput:                 [x] WNL  []  Wound present  Face:                     [x] WNL  []  Wound present  Ears:                      [x] WNL  []  Wound present  Spine:                    [x] WNL  []  Wound present  Shoulders:             [x] WNL  []  Wound present  Elbows:                  [x] WNL  []  Wound present  Sacrum/coccyx:     [x] WNL  []  Wound present  Ischial Tuberosity:  [x] WNL  []  Wound present  Trochanter/Hip:      [x] WNL  []  Wound present  Knees:                   [x] WNL  []  Wound present  Ankles:                   [x] WNL  []  Wound present  Heels:                    [x] WNL  []  Wound present  Other pressure areas:  []  Wound location       Device related: []  Device name:         LDA completed if wound present: yes/no  Consult WOCN if necessary    Other skin related issues, ie tears, rash, etc, document in Integumentary flowsheet

## 2023-01-30 NOTE — H&P (Signed)
Cc: left-sided numbness and leg weakness    HPI:   77 yo female presented to the ED with similar symptoms for the past year with multiple admissions.  She reported dizziness and difficulty walking.  Left side of the face, arm and leg were numb and some left leg weakness. In the ED the pressure was 212/85.  She denied change in vision or speech    Allergies:     Allergies   Allergen Reactions    Morphine Itching    Morphine And Related Itching and Nausea And Vomiting     dizzy       Medication:   (Not in a hospital admission)      Past Medical History:     Past Medical History:   Diagnosis Date    Breast cancer 2014    right breast mastectomy    Exercise-induced angina     negative work up    GERD (gastroesophageal reflux disease)     Headache     resolved    Hyperlipidemia     Hypertension     Hypothyroidism     Low back pain     Malignant neoplasm of overlapping sites of right female breast 10/19/2015    Syncope and collapse     Chronic Vertigo    Vertigo        Past Surgical History:     Past Surgical History:   Procedure Laterality Date    COLONOSCOPY, DIAGNOSTIC (SCREENING)  2021    COLONOSCOPY, REMOVAL OF TUMOR, POLYP, OR OTHER LESION BY SNARE TECHNIQUE N/A 07/21/2020    Procedure: COLONOSCOPY, POLYPECTOMY;  Surgeon: Einar Grad, MD;  Location: [facility];  Service: Gastroenterology;  Laterality: N/A;    EGD, BIOPSY N/A 07/21/2020    Procedure: EGD, BIOPSY;  Surgeon: Einar Grad, MD;  Location: [facility];  Service: Gastroenterology;  Laterality: N/A;    EXPLORATORY LAPAROTOMY N/A 05/22/2021    Procedure: EXPLORATORY LAPAROTOMY;  Surgeon: Delena Serve, MD;  Location: [facility];  Service: General;  Laterality: N/A;    HYSTERECTOMY      LAPAROSCOPY, DIAGNOSTIC N/A 05/22/2021    Procedure: LAPAROSCOPY, DIAGNOSTIC;  Surgeon: Delena Serve, MD;  Location: [facility];  Service: General;  Laterality: N/A;    LAPAROTOMY, COLECTOMY, RIGHT Right 05/22/2021    Procedure: LAPAROTOMY, COLECTOMY,  RIGHT;  Surgeon: Delena Serve, MD;  Location: [facility];  Service: General;  Laterality: Right;    MASTECTOMY Right 2015       Family History:     Family History   Problem Relation Age of Onset    Breast cancer Neg Hx        Social History:     Social History     Socioeconomic History    Marital status: Widowed     Spouse name: Not on file    Number of children: Not on file    Years of education: Not on file    Highest education level: Not on file   Occupational History    Not on file   Tobacco Use    Smoking status: Never    Smokeless tobacco: Never   Vaping Use    Vaping status: Never Used   Substance and Sexual Activity    Alcohol use: Yes     Comment: occ    Drug use: No    Sexual activity: Not Currently   Other Topics Concern    Not on  file   Social History Narrative    Not on file     Social Determinants of Health     Financial Resource Strain: Low Risk  (05/12/2022)    Overall Financial Resource Strain (CARDIA)     Difficulty of Paying Living Expenses: Not hard at all   Food Insecurity: No Food Insecurity (01/30/2023)    Hunger Vital Sign     Worried About Running Out of Food in the Last Year: Never true     Ran Out of Food in the Last Year: Never true   Transportation Needs: No Transportation Needs (05/12/2022)    PRAPARE - Therapist, art (Medical): No     Lack of Transportation (Non-Medical): No   Physical Activity: Not on file   Stress: Not on file   Social Connections: Not on file   Intimate Partner Violence: Not At Risk (11/18/2022)    Humiliation, Afraid, Rape, and Kick questionnaire     Fear of Current or Ex-Partner: No     Emotionally Abused: No     Physically Abused: No     Sexually Abused: No   Housing Stability: Unknown (05/12/2022)    Housing Stability Vital Sign     Unable to Pay for Housing in the Last Year: No     Number of Places Lived in the Last Year: Not on file     Unstable Housing in the Last Year: No       Review of Systems:   Review of Systems    Constitutional: Negative.    HENT: Negative.     Eyes: Negative.    Respiratory: Negative.     Cardiovascular: Negative.    Gastrointestinal: Negative.    Musculoskeletal: Negative.    Skin: Negative.    Neurological:  Positive for dizziness, sensory change and focal weakness. Negative for headaches.   Endo/Heme/Allergies: Negative.    Psychiatric/Behavioral: Negative.         Physical Exam:   Physical Exam  Constitutional:       Appearance: Normal appearance.   HENT:      Head: Normocephalic and atraumatic.      Mouth/Throat:      Mouth: Mucous membranes are moist.   Eyes:      Extraocular Movements: Extraocular movements intact.      Pupils: Pupils are equal, round, and reactive to light.   Cardiovascular:      Rate and Rhythm: Normal rate and regular rhythm.      Pulses: Normal pulses.      Heart sounds: Normal heart sounds.   Pulmonary:      Effort: Pulmonary effort is normal.      Breath sounds: Normal breath sounds.   Abdominal:      General: Abdomen is flat. There is no distension.      Palpations: Abdomen is soft.   Musculoskeletal:         General: No swelling or tenderness.      Cervical back: Normal range of motion and neck supple.   Skin:     General: Skin is warm and dry.   Neurological:      Mental Status: She is alert.   Psychiatric:         Mood and Affect: Mood normal.         Vitals:    01/30/23 1854   BP: (!) 181/96   Pulse: (!) 117   Resp:    Temp:    SpO2:  98%       Labs:     Recent Labs   Lab 01/30/23  1556   WBC 7.27   RBC 4.07   Hgb 11.1*   Hematocrit 36.1   Platelets 256   Glucose 95   BUN 15.0   Creatinine 0.8   Calcium 9.4   Sodium 139   Potassium 4.1   Chloride 103   CO2 30*       Rads:   Radiological Procedure reviewed.      CT ANGIOGRAM HEAD NECK  XR CHEST AP PORTABLE  CT ANGIOGRAM CEREBRAL PERFUSION W 3D RECONSTRUCTION  CT HEAD WO CONTRAST  IMPRESSION:      1.Normal CT perfusion examination of the head. There is no territorial  perfusion abnormality.  2.There is no stenosis of the  proximal right internal carotid artery based  on NASCET criteria.    3.There is no stenosis of the proximal left internal carotid artery based  on NASCET criteria.  4.The vertebrobasilar arterial system is patent. Unchanged mild stenosis of  the right cervical vertebral artery at the C4-C5 level due to surrounding  degenerative changes of the cervical spine.   5.Normal intracranial CT angiogram. There is no proximal intracranial large  vessel occlusion.  6.Advanced multilevel degenerative changes of the cervical spine.      A/P:  Patient has work up again for similar symptoms with multiple admissions the past year.  This time head CT/perfusion were unremarkable. ED doc suggested discharge to home but patient refused to be discharge.  Her son is out of town.  Will admit for social reason, neurology consult Dr. Francesco Sor was called and will see her in the morning

## 2023-01-30 NOTE — Progress Notes (Signed)
Pt received from ED, alert and oriented x3, Stable vital signs, on RA, NS on tele, continent X2, denies any chest pain or dizziness.      Pt oriented to unit, Fall prevention contract completed, Patient informed of visitor policy, Pt's belonging documented. Skin assessment done with second nurse verification.     Bed in lowest position, bed alarm activated, call bell within reach, pt agrees to call for assistance, hourly rounding and care plan in place, will continue to monitor throughout shift.

## 2023-01-30 NOTE — EDIE (Signed)
[facility]?NOTIFICATION?01/30/2023 14:48?Emily, Zilberman [name]?MRN: [id number]    [facility]'s patient encounter information:   FAO:?[id number]  Account [id number]  Billing Account [id number]      Criteria Met      5 ED Visits in 12 Months    Security and Safety  No Security Events were found.  ED Care Guidelines  There are currently no ED Care Guidelines for this patient. Please check your facility's medical records system.        Prescription Monitoring Program  Narx Score not available at this time.    E.D. Visit Count (12 mo.)  Facility Visits   [place] [facility] 9   Total 9   Note: Visits indicate total known visits.     Recent Emergency Department Visit Summary  Date Facility [facility] Type Diagnoses or Chief Complaint    Jan 30, 2023  [place] [facility]  [place]  [place]  Emergency      Leg Pains, Chest Pains      Nov 21, 2022  [place] [facility]  [place]  [place]  Emergency      Elevated blood-pressure reading, without diagnosis of hypertension      Sciatica, left side      Generalized Weakness      Nov 18, 2022  [facility] [facility]  [place]  [place]  Emergency      Encounter for screening mammogram for malignant neoplasm of breast      Dizziness      Back Pain      Leg Pain      tingling; dizziness      Oct 30, 2022  [place] [facility]  [place]  [place]  Emergency      Constipation, unspecified      Dizziness and giddiness      Melena      Rectal Bleeding      Blood in Stool      Oct 17, 2022  [place] [facility]  [place]  [place]  Emergency      Unspecified fall, initial encounter      Lumbago with sciatica, left side      Frequency of micturition      Back Pain      L leg pain      Sep 16, 2022  [place] [facility]  [place]  [place]  Emergency      Anesthesia of skin      Generalized weakness      Numbness      medic      Jun 30, 2022  [place] [facility]  [place]  [place]  Emergency      Dizziness and giddiness      Constipation, unspecified      Hemorrhage of anus and  rectum      Rectal Bleeding      Hematuria      Headache      Abdominal Pain      Dizziness      MEDIC #[id number]      May 12, 2022  [place] [facility]  [place]  [place]  Emergency      Extremity Weakness      Dizziness      Dizziness, weakness, left side uneasy      Feb 15, 2022   [facility]  [place]    Emergency      Chest  pain, unspecified      Generalized abdominal pain      Weakness      Generalized Weakness        Recent Inpatient Visit Summary  No Recent Inpatient Visits were found.  Care Team  Provider Specialty Phone Fax Service Dates   [place], [facility] , DO Internal Medicine   Current    Rondel Baton, M.D. Internal Medicine   Current    DAVIDSON, Janet Berlin MD Judie Petit, MD Family Medicine: Adult Medicine [phone] Current    Everlean Patterson, M.D. Internal Medicine   Current    Santa Lighter, M.D. Internal Medicine   Current    Cornelius Moras, NP Nurse Practitioner: Adult Health   Current    Sandria Bales Nurse Practitioner: Gerontology [phone]  Current      [facility]  This patient has registered at the [facility] Emergency Department  For more information visit: [email or web address]     PLEASE NOTE:     1.   Any care recommendations and other clinical information are provided as guidelines or for historical purposes only, and providers should exercise their own clinical judgment when providing care.    2.   You may only use this information for purposes of treatment, payment or health care operations activities, and subject to the limitations of applicable [facility] Policies.    3.   You should consult directly with the organization that provided a care guideline or other clinical history with any questions about additional information or accuracy or completeness of information provided.    ? 2024 [facility] - [email or web address]

## 2023-01-30 NOTE — ED to IP RN Note (Addendum)
[facility]  ED NURSING NOTE FOR THE RECEIVING INPATIENT NURSE   ED NURSE [place] [phone]   ED CHARGE RN Suzette Battiest   ADMISSION INFORMATION   Lilian Fuhs Nolton is a 77 y.o. female admitted with an ED diagnosis of:    No diagnosis found.     Isolation: None   Allergies: Morphine and Morphine and related   Holding Orders confirmed? Yes   Belongings Documented? Purse, glasses, hat, shirt, pants, socks, shoes, cell phone. Pt placed earrings in purse   Home medications sent to pharmacy confirmed? No   NURSING CARE   Patient Comes From:   Mental Status: Home Independent  alert and oriented to person, place, situation. Disoriented to time. June/May   ADL: Independent with all ADLs   Ambulation: mild difficulty, pt shuffles her feet to walk. Denies use of assisted device.   Pertinent Information  and Safety Concerns:     Broset Violence Risk Level: Low NIH 3, put unable to correctly answer month and age and right sided numbness and tingling.       CT / NIH   CT Head ordered on this patient?  Yes   NIH/Dysphagia assessment done prior to admission? Yes   VITAL SIGNS (at the time of this note)      Vitals:    01/30/23 1730   BP: 194/82   Pulse: 78   Resp: 18   Temp: 98.4 F (36.9 C)   SpO2: 99%     Pain Score: 7-severe pain (01/30/23 1730)

## 2023-01-31 DIAGNOSIS — R200 Anesthesia of skin: Secondary | ICD-10-CM

## 2023-01-31 DIAGNOSIS — Z515 Encounter for palliative care: Secondary | ICD-10-CM

## 2023-01-31 LAB — ECG 12-LEAD
Atrial Rate: 67 {beats}/min
Atrial Rate: 58 {beats}/min
IHS MUSE NARRATIVE AND IMPRESSION: NORMAL
P Axis: 48 degrees
P Axis: 41 degrees
P-R Interval: 160 ms
P-R Interval: 188 ms
Q-T Interval: 370 ms
Q-T Interval: 408 ms
QRS Duration: 90 ms
QRS Duration: 84 ms
QTC Calculation (Bezet): 390 ms
QTC Calculation (Bezet): 400 ms
R Axis: 52 degrees
R Axis: 39 degrees
T Axis: 33 degrees
T Axis: 43 degrees
Ventricular Rate: 67 {beats}/min
Ventricular Rate: 58 {beats}/min

## 2023-01-31 NOTE — Progress Notes (Signed)
CM Update:     [place] Plan- return home w/ HHC.     HHC- referral sent to [facility]) via secure epic chat. [place] address and PCP verified.       PCP follow-up- this CM arranged for 02/02/23 at 9:15am with NP Leticia Clas (pt's PCP office).        Transportation- cab voucher.       Berline Chough, BSN, RN  RN Case Manager   [phone]  (F): [phone]

## 2023-01-31 NOTE — Progress Notes (Signed)
Palliative Medicine & Comprehensive Care  Service Phone Number: MV: [phone], Mon-Fri 9a-4p    Clinical Therapist - Progress Note       Situation: Palliative Clinical Therapist and Palliative NP conducted joint visit with patient.     Background: Per Palliative NP note, Emily [name] is a 77 y.o. female with significant PMH of breast Cancer s/p right mastectomy, Hypertension, Hyperlipidemia, Hypothyroidism and Vertigo. Pt was admitted to [facility] on 01/30/2023 for left sided numbness. CT/perfusion  head, neck and brain were unremarkable , neurology consulted.    Assessment: Palliative NP and Palliative Clinical Therapist conducted joint visit with patient. Provided introduction and explanation of role. Engaged in active listening and rapport building conversation. Patient shared she is discharging home today. Her plan is to temporarily live w/daughter in [place] and then, son in [place]. Patient reported she knows she can't live alone and wants to try living with her children. Provided validation and normalization of her feelings. Patient endorsed support from 5 children and 9 great/grandchildren.    Explored ACP. PC NP asked patient who we should contact in the event patient is unable to communicate. Patient verbally named daughter, Diane as Social research officer, government. PC NP discussed code status w/patient. Patient reported she wants to remain full code.    Patient appreciative of visit and emotional support provided. Patient to discharge today.     Interventions:  Evidence-Based Clinical Interventions provided during this admission? Yes - Established patient/family goals of care, Psychological education, Advance care planning, and Provided Psychosocial Support    Recommendations/Plan:  Patient to discharge today.   Overall, patient appears to be coping appropriately with no acute needs noted at this time.             Rayford Halsted, MSW  Palliative Care Clinical Therapist I  Palliative Medicine  & Comprehensive Care  [phone]    Spectra: [phone]

## 2023-01-31 NOTE — Progress Notes (Addendum)
Initial Case Management Assessment and Discharge Planning      Situation     Requested to speak with patient and family regarding discharge planning.  Verified demographics.  Observation status.     Background     Patient with confusion per family.  Spoke with son Tamsen Meek [phone]) over phone.  His spouse was also on the phone.    Patient has a son Chauncey Reading) local, a son (Theophilus Amoah) in [place], and a daughter Jeoffrey Massed) in [place] out of town.  Apparently the son that is local has been disconnected recently according to the other son.    She lives alone in a multi-level home with stairs to get in to and out of home.  She has been recently weak according to son.  Cognitively, son states she has had hallucinations and been forgetful.  He states that he thinks she goes to the hospital when she gets afraid.  Son states that sometimes stays with her daughter, and was scheduled to stay with him soon.  They have been encouraging her to relocate to stay with them.  They also talked about how her money seems to disappear when she gets it, and they think she gives it to people.  She is disorganized per family.  Family states that she loses time.    Chart reviewed.    On previous assessments months ago, patient was alert and oriented.  She was previously independent in ADL's.  She has previously received home health services ([facility]), and has also previously been to rehabilitation [facility]).          Assessment   PMH of breast ca, htn, hypothyroidism, syncope, vertigo.    Here with left facial numbness and weakness.  Patient got ride to emergency department according to notes.    She was disoriented to time in emergency department.  Patient was shuffling feet to ambulate.         Recommendation   Observation status with therapy evaluation.  Family to decide on coming to pick her up and take her home to [place] versus private caregiver until they are able.    Watch therapy  recommendations.    Extensive conversation regarding medical criteria for inpatient admission.  Family aware that she can not stay in hospital while they work on getting her to [place].  They are aware that we could have therapy evaluate her to see if she is a candidate for rehabilitation.      We discussed private pay caregiver, however, they stated she would not have funds for this.  We talked about Medicaid, however, they prefer to wait until she is in [place] with them to apply for Medicaid there.    Family is working on getting vacation hours to come and get her.  Only other option would be non-medical transport to get her to her son, as he is closer than daughter.  The son's spouse already cares for her father in the home, and they do have room for her to come there.    We talked about transitioning her physician to that area, and family requested that we send any available records to [facility] at [address]  My chart access was also discussed with the family ([email or web address]) as this would give them option to see her records.  We talked about patient giving her son access.      They plan on following Dr. Arita Miss at [facility] (phone [phone] / fax [phone]).  I was not able to speak with patient this evening, so case manager is going to need to speak with patient in morning regarding plans and to see her cognitive status.    According to records, she is only listed as having Medicare [employer] with no Medicaid.

## 2023-01-31 NOTE — UM Notes (Signed)
Admit to Observation [[id number]]    Electronically signed by: Loetta Rough, DO on 01/30/23 1934 Status: Completed   Ordering user: Loetta Rough, DO 01/30/23 1934 Ordering provider: Loetta Rough, DO   Authorized by: Loetta Rough, DO   Cosigning events  Electronically cosigned by Gaynelle Adu, MD 01/31/23 [phone] for Ordering     77 yo female presented to the ED with similar symptoms for the past year with multiple admissions. She reported dizziness and difficulty walking. Left side of the face, arm and leg were numb and some left leg weakness. In the ED the pressure was 212/85.     CXR: No acute thoracic abnormality.     4/29 Medicine plan:  Patient has work up again for similar symptoms with multiple admissions the past year. This time head CT/perfusion were unremarkable. ED doc suggested discharge to home but patient refused to be discharge. Her son is out of town. Will admit for social reason, neurology consult was called and will see her in the morning            01/30/23 15:07 01/30/23 15:56   Hemoglobin  11.1 (L)   MCHC  30.7 (L)   Whole Blood Glucose POCT 139 (H)    CO2  30 (H)          Gillian Scarce, MSN, RN  [facility], Revenue Cycle Department, Utilization Review  Phone [phone]   Fax [phone]   [email or web address]

## 2023-01-31 NOTE — Discharge Summary (Signed)
MEDICINE DISCHARGE SUMMARY    Date Time: 01/31/23 1:36 PM  Patient Name: Emily [name]  Attending Physician: Darryl Nestle, MD  Primary Care Physician: Rondel Baton, MD    Date of Admission: 01/30/2023  Date of Discharge: 01/31/2023    Discharge Diagnoses:     Principal Diagnosis (Diagnosis after study, that is chiefly responsible for admission): Numbness                Disposition:    Disposition: home with family    Pending Results, Recommendations & Instructions to providers after discharge:     Micro / Labs / Path pending:   Unresulted Labs       None          Wound Care Instructions: n/a  Date of completion for antibiotics or other medications: n/a  Other: n/a  Code Status:full    Procedures/Radiology performed:   Radiology: all results from this admission  Chest  AP Portable    Result Date: 01/30/2023  No acute thoracic abnormality. Nonda Lou, MD 01/30/2023 4:23 PM    CTA  Head & Neck    Result Date: 01/30/2023  1.Normal CT perfusion examination of the head. There is no territorial perfusion abnormality. 2.There is no stenosis of the proximal right internal carotid artery based on NASCET criteria.  3.There is no stenosis of the proximal left internal carotid artery based on NASCET criteria. 4.The vertebrobasilar arterial system is patent. Unchanged mild stenosis of the right cervical vertebral artery at the C4-C5 level due to surrounding degenerative changes of the cervical spine. 5.Normal intracranial CT angiogram. There is no proximal intracranial large vessel occlusion. 6.Advanced multilevel degenerative changes of the cervical spine. Milan Waunita Schooner, MD 01/30/2023 4:16 PM    CT Perfusion Brain    Result Date: 01/30/2023  1.Normal CT perfusion examination of the head. There is no territorial perfusion abnormality. 2.There is no stenosis of the proximal right internal carotid artery based on NASCET criteria.  3.There is no stenosis of the proximal left internal carotid artery  based on NASCET criteria. 4.The vertebrobasilar arterial system is patent. Unchanged mild stenosis of the right cervical vertebral artery at the C4-C5 level due to surrounding degenerative changes of the cervical spine. 5.Normal intracranial CT angiogram. There is no proximal intracranial large vessel occlusion. 6.Advanced multilevel degenerative changes of the cervical spine. Milan Waunita Schooner, MD 01/30/2023 4:16 PM    CT Head WO Contrast    Result Date: 01/30/2023   1.No CT evidence of acute territorial infarction or hemorrhage. 2.Mild chronic appearing microvascular ischemic changes. Garnette Gunner, MD 01/30/2023 3:24 PM   Surgery: all results from this admission  * No surgery found [facility] Course:     Reason for admission/ HPI: numbness     Hospital Course:     77 yo female presented to the ED with similar symptoms for the past year with multiple admissions. She reported dizziness and difficulty walking. Left side of the face, arm and leg were numb and some left leg weakness. In the ED the pressure was 212/85. She denied change in vision or speech     CT Head and CXR were normal with acute abnormalities, while the patient's hospital course remained benign. Her symptoms self-resolved. On the day of discharge, patient felt well and was comfortable with going home. She was asked to follow up with her Pcp for routine post hospital follow up. Given improvement in her symptoms, she was evaluated as stable  for [place] with the following recs;     1) Take all other medications as prescribed and how you normally take them at home.  2) Please follow up with your primary care physician within 1-2 weeks of discharge for routine post-hospital follow up.  3) Avoid over strenuous activities and heavy lifting.     #Weakness  A/P:  Patient has work up again for similar symptoms with multiple admissions the past year.  This time head CT/perfusion were unremarkable. ED doc suggested discharge to home but patient refused to be  discharge.  Her son is out of town.  Will admit for social reason, neurology consult Dr. Francesco Sor was called and will see her in the morning           Discharge condition: stable    Discharge Day Exam:  Today:  BP 137/73   Pulse 63   Temp 98.8 F (37.1 C) (Oral)   Resp 16   Ht 1.6 m (5\' 3" )   Wt 67 kg (147 lb 11.2 oz)   SpO2 99%   BMI 26.16 kg/m   Ranges for the last 24 hours:  Temp:  [97.7 F (36.5 C)-98.8 F (37.1 C)] 98.8 F (37.1 C)  Heart Rate:  [54-117] 63  Resp Rate:  [15-20] 16  BP: (107-212)/(55-96) 137/73  Body mass index is 26.16 kg/m.    No intake or output data in the 24 hours ending 01/31/23 1336 General: awake, alert ,in no acute distress  Cardiovascular: regular rate and rhythm, no murmurs, rubs or gallops  Lungs: clear to auscultation bilaterally, no additional sounds  Abdomen: soft, non-tender, non-distended; normoactive bowel sounds  Extremities: no edema  Neurological: Alert and oriented X 3, moves all extremities.   Other:         Wounds/decutibus ulcers/stage:    Consultations:   Treatment Team:   Attending Provider: Darryl Nestle, MD  Recent Labs - Last 2:       Recent Labs   Lab 01/30/23  1556   WBC 7.27   Hgb 11.1*   Hematocrit 36.1   Platelets 256      Recent Labs   Lab 01/30/23  1556   PT 11.7   PT INR 1.0   PTT 36      Recent Labs   Lab 01/30/23  1556   Sodium 139   Potassium 4.1   Chloride 103   CO2 30*   BUN 15.0   Creatinine 0.8   eGFR >60.0   Glucose 95   Calcium 9.4     Recent Labs   Lab 01/30/23  1556   Alkaline Phosphatase 71   Bilirubin, Total 0.3   Protein, Total 6.1   Albumin 3.6   ALT 18   AST (SGOT) 26            Invalid input(s): "FREET4"         Microbiology Results (last 15 days)       ** No results found for the last 360 hours. **            Discharge Instructions & Follow Up Plan for Patient:   Discharge Diet: Adult diet Diet type: Regular  Activity/Weight Bearing Status: as tolerated   Patient was instructed to follow up with:      Follow-up Information        [facility] [place]. Schedule an appointment as soon as possible for a visit .    Specialty: Neurology  Contact information:  [address]  [place] [address]  [phone]  Additional information:  From [place]:   Take [address] for [address]. Take exit # 4 for [address]   Turn left onto [address] Turn left onto [address] at the second light.   We are at the 2nd corner on the right.   From [place]             Beckey Downing, Phoebe Sharps, NP. Go in 2 day(s).    Specialty: Nurse Practitioner  Why: 02/02/2023 at 9:15 am.  Contact information:  [address]  [place] [address]  [phone]               Rondel Baton, MD .    Specialty: Internal Medicine  Contact information:  [address]  [place] [address]  [phone]                             Complete instructions and follow up are in the patient's After Visit Summary    Minutes spent coordinating discharge and reviewing discharge plan:>35 minutes    Discharge Medications:        Medication List        CONTINUE taking these medications      amLODIPine 10 MG tablet  Commonly known as: NORVASC  Take 1 tablet (10 mg total) by mouth daily     anastrozole 1 MG tablet  Commonly known as: ARIMIDEX  Take 1 tablet (1 mg total) by mouth every morning     aspirin 81 MG chewable tablet  Chew 1 tablet (81 mg total) by mouth daily     celecoxib 200 MG capsule  Commonly known as: CeleBREX     diclofenac Sodium 1 % Gel topical gel  Commonly known as: VOLTAREN  Apply 2 g topically 4 (four) times daily     ezetimibe 10 MG tablet  Commonly known as: ZETIA  Take 1 tablet (10 mg) by mouth daily     furosemide 10 MG/ML solution  Commonly known as: LASIX     gabapentin 100 MG capsule  Commonly known as: NEURONTIN  Take 1 capsule (100 mg total) by mouth 2 (two) times daily as needed (Neuropathy)     IRON PO     levothyroxine 25 MCG tablet  Commonly known  as: SYNTHROID  Take 1 tablet (25 mcg total) by mouth every morning     lisinopril 40 MG tablet  Commonly known as: ZESTRIL  Take 1 tablet (40 mg total) by mouth daily     meclizine 25 MG tablet  Commonly known as: ANTIVERT  Take 1 tablet (25 mg) by mouth 3 (three) times daily as needed for Dizziness     multivitamin with minerals tablet     pantoprazole 40 MG tablet  Commonly known as: PROTONIX     polyethylene glycol 17 g packet  Commonly known as: MIRALAX  Take 17 g by mouth daily     rosuvastatin 40 MG tablet  Commonly known as: CRESTOR  Take 1 tablet (40 mg) by mouth daily              Immunizations provided:   Immunization History   Administered Date(s) Administered    COVID-19 mRNA MONOVALENT vaccine PRIMARY SERIES 12 years and above (Moderna) 100 mcg/0.5 mL 12/19/2019, 01/16/2020  COVID-19 mRNA MONOVALENT vaccine PRIMARY SERIES 12 years and above Proofreader) 30 mcg/0.3 mL (DILUTE BEFORE USE) 08/14/2020    COVID-19 mRNA MONOVALENT vaccine PRIMARY SERIES 12 years and above (Pfizer) 30 mcg/0.3 mL (DO NOT DILUTE) 01/09/2021       This note was generated by the Epic EMR system/ Dragon speech recognition and may contain inherent errors or omissions not intended by the user. Grammatical errors, random word insertions, deletions and pronoun errors  are occasional consequences of this technology due to software limitations. Not all errors are caught or corrected. If there are questions or concerns about the content of this note or information contained within the body of this dictation they should be addressed directly with the author for clarification.    Signed by: Darryl Nestle, MD, MD  [facility]  Department of Medicine  CC: Rondel Baton, MD

## 2023-01-31 NOTE — Plan of Care (Signed)
Problem: Pain interferes with ability to perform ADL  Goal: Pain at adequate level as identified by patient  Outcome: Progressing  Flowsheets (Taken 01/31/2023 0025)  Pain at adequate level as identified by patient:   Identify patient comfort function goal   Assess for risk of opioid induced respiratory depression, including snoring/sleep apnea. Alert healthcare team of risk factors identified.   Assess pain on admission, during daily assessment and/or before any "as needed" intervention(s)   Reassess pain within 30-60 minutes of any procedure/intervention, per Pain Assessment, Intervention, Reassessment (AIR) Cycle   Evaluate if patient comfort function goal is met   Offer non-pharmacological pain management interventions   Evaluate patient's satisfaction with pain management progress     Problem: Moderate/High Fall Risk Score >5  Goal: Patient will remain free of falls  Outcome: Progressing  Flowsheets (Taken 01/31/2023 0025)  High (Greater than 13):   HIGH-Apply yellow "Fall Risk" arm band   HIGH-Bed alarm on at all times while patient in bed   MOD-Perform dangle, stand, walk (DSW) prior to mobilization  VH Moderate Risk (6-13): YELLOW "FALL RISK" ARM BAND     Problem: Compromised Activity/Mobility  Goal: Activity/Mobility Interventions  Outcome: Progressing  Flowsheets (Taken 01/31/2023 0025)  Activity/Mobility Interventions: Pad bony prominences, TAP Seated positioning system when OOB, Promote PMP, Reposition q 2 hrs / turn clock, Offload heels     Problem: Day of Admission - Stroke  Goal: Core/Quality measure requirements - Admission  Outcome: Progressing  Flowsheets (Taken 01/31/2023 0025)  Core/Quality measure requirements - Admission:   Document NIH Stroke Scale on admission   Document nursing swallow/dysphagia screen on admission. If patient fails, keep patient NPO (follow your hospital protocol on swallowing screening).   VTE Prevention: Ensure anticoagulant(s) administered and/or anti-embolism  stockings/devices documented as ordered   Ensure antithrombotic administered or contraindication documented by LIP   Ensure lipid panel ordered   Begin stroke education on admission (must include Modifiable Risk Factors, Warning Signs and Symptoms of Stroke, Activation of Emergency Medical System and Follow-up Appointments) Ensure handout has been given and documented.   Ensure PT/OT and/or SLP ordered   If diagnosis or history of Atrial Fib/Atrial Flutter, ensure oral anticoagulation is initiated or contraindication documented by LIP     Problem: Every Day - Stroke  Goal: Neurological status is stable or improving  Outcome: Progressing  Flowsheets (Taken 01/31/2023 0025)  Neurological status is stable or improving:   Monitor/assess/document neurological assessment (Stroke: every 4 hours)   Re-assess NIH Stroke Scale for any change in status   Observe for seizure activity and initiate seizure precautions if indicated   Monitor/assess NIH Stroke Scale  Goal: Stable vital signs and fluid balance  Outcome: Progressing  Flowsheets (Taken 01/31/2023 0025)  Stable vital signs and fluid balance:   Position patient for maximum circulation/cardiac output   Monitor intake and output. Notify LIP if urine output is < 30 mL/hour.   Monitor and assess vitals every 4 hours or as ordered and hemodynamic parameters   Encourage oral fluid intake   Apply telemetry monitor as ordered  Goal: Mobility/Activity is maintained at optimal level for patient  Outcome: Progressing  Flowsheets (Taken 01/31/2023 0025)  Mobility/activity is maintained at optimal level for patient:   Encourage independent activity per ability   Consult/collaborate with Physical Therapy and/or Occupational Therapy

## 2023-01-31 NOTE — Discharge Summary -  Nursing (Signed)
Discharge instruction given to pt. Verbalized understanding of discharge instruction including home med regimen, s/s to notify MD and f/u appointments. Afebrile. Vss. Denies pain or discomfort upon discharge.

## 2023-01-31 NOTE — Discharge Instr - AVS First Page (Addendum)
Reason for your Hospital Admission:  Numbness      Instructions for after your discharge:  1) Take all other medications as prescribed and how you normally take them at home.  2) Please follow up with your primary care physician within 1-2 weeks of discharge for routine post-hospital follow up.  3) Avoid over strenuous activities and heavy lifting.     Discharge Instructions:     Call Your Doctor if you have:  Chest pain  Shortness of breath or difficulty breathing  Temperature greater than 100.4  Persistent nausea and vomiting  Severe uncontrolled pain  Signs of infection (pain, swelling, redness, tenderness, odor, or green/yellow discharge)  Headache or visual disturbances  Hives  Persistent dizziness or light-headedness  Extreme fatigue  Bleeding  Any other questions or concerns you may have after discharge    In an emergency, call 911 or go to an Emergency Department at a nearby hospital      Additional Instructions:  -Schedule a follow up appointment with your Primary Care Provider within 1 week of discharge.     -Carry a list of your medications and allergies with you at all times    -Resume your usual diet unless otherwise directed. Good nutrition promotes healing.    -Call your pharmacy at least 1 week in advance to refill prescriptions    -Please have your nurse go over all the discharge medications/ instruction with you before discharge.     Information on Taking Medicine Safely  Medicine is given to help treat or prevent illness. But if you don't take it correctly, it might not help. It might even harm you. Your doctor or pharmacist can help you learn the right way to take your medicine. Listed below are some tips to help you take medicine safely.    Safety Tips  Have a routine for taking each medicine. Make it part of something you do each day, such as brushing your teeth or eating a meal.  When you go to the hospital or your doctor's office, bring all your current medicines in their original boxes or  bottles. If you can't do that, bring an up-to-date list of your medicines.  Do not stop taking a prescription medicine unless your doctor tells you to. Doing so could make your condition worse.  Do not share medicines.  Let your doctor and pharmacist know of any allergies you have.  Taking prescription medicines with alcohol, street drugs, herbs, supplements, or even some over-the-counter medicines can be harmful. Talk to your doctor or pharmacist before using any of these things while taking a prescription medcine.  When filling your prescriptions, try using the same pharmacy for all your medicines. If not, let the pharmacist know what medicines you are already taking.  Keep medicines out of the reach of children and pets. Store medicines in a cool, dry, dark place--not in the bathroom or in the kitchen near moisture or heat.  Do not use medicine that has expired or that doesn't look or smell right. Bring expired or old medicine to your pharmacy for disposal.    Using Generic Medicines  Medicines have brand names and generic (chemical) names. When a medicine is first made, it is sold only under its brand name. Later, it can be made and sold as a generic. Generic medicines cost less than brand-name medicines and most work just as well. Unless their doctor says otherwise, most people can use the generic medicine instead of the brand-name medicine.  Always follow  your healthcare professional's instructions.

## 2023-01-31 NOTE — Consults (Signed)
[facility] Palliative Medicine & Comprehensive Care Follow Up   Service Spectralink: [phone]  Mon-Fri 9a-4p  [place] Pager: #[phone]    24/7        Palliative Care Consult   Date Time: 01/31/23 9:20 AM   Patient Name: Emily [name], Emily [name]   Location: [id number]/WG956-21   Attending Physician: Darryl Nestle, MD   Primary Care Physician: Rondel Baton, MD   Consulting Provider: Lisbeth Renshaw, FNP   Consulting Service: Palliative Medicine and Comprehensive Care  Consult request from Darryl Nestle, MD to see patient regarding:   Reason for Referral: Clarify goals of care; Advance care planning     Palliative Diagnosis Category: Sepsis/infectious disease and Neurology (CVA/Dementia...)  Patient Type: New    Planssment & Plan  Assessment & Plan   Impression      Emily [name] is a 77 y.o. female with significant PMH of breast Cancer s/p right mastectomy, Hypertension, Hyperlipidemia, Hypothyroidism and Vertigo. Pt was admitted to [facility] on 01/30/2023 for left sided numbness .  CT/perfusion  head, neck and brain were unremarkable , neurology consulted.    Palliative medicine and comprehensive care consult: Clarify goals of care and advance care planning    Thank you for allowing [place] to participate in the care of this patient.    Recommendations   1. Goals of Care:      # What matters most to the patient:  To return home and spend time with her children    # Code Status:       - Full Code patient's choice        -Discussed Code status in detail with the patient . Introduced cardiopulmonary    resuscitation as an emergency lifesaving procedure performed when the heart stops beating. I explained in depth about possible outcomes during and after CPR with decreased chance of survival as time slip.       -I explained that  the choice about CPR does not affect the care she  will receive or decisions about other treatments.       # Advance Care Planning:       - ACP Validation: Advance care  planning discussion initialized       - Medical Decision Maker: next of kin daughter Lafonda Mosses .patient has 5 adult children;Jenean Lindau and Dorma Russell       - ACP Document: None      # Palliative Care:       - Introduced the philosophy of palliative care to the patient and family as care that is aimed  in assisting with symptom management and establishing goals of care with the patient and family. Discussed guidance with medical decision making, diagnosis, past medical history and  current  treatment plans.Provide an  overview of the interdisciplinary team for Palliative Medicine that consist of clinical therapist, nurse practitioner, and chaplain.     Visited the patient at bedside. She appears comfortable and resting in bed  The patient has full  capacity for high risk medical decision making and able  to participate in palliative care conversation.  Family was not present at bedside. I introduced palliative care services.  We discussed/reviewed past medical history, reason for admissions and current treatment plans/goals of care.  The patient reported that she has been cleared by the doctor to be medically stable for discharge home . Her daughter Lafonda Mosses will be coming to take her to [place] to spend some time with her . According to  the patient her children will be taking turns having her live with them to prevent her from living alone   Palliative care listened empathetically, answered all questions appropriately and provided psychosocial support.          2. Symptom Management:  Palliative Care was consulted for goals of care, Advance care planning,  and psychosocial support. Will Defer symptom management to the primary team      # Debility:     # Poor Performance Status:       -Generalized weakness       -Appreciate PT/OT's input    3. Psychosocial: Great family support    4. Spiritual:        Management consultant services available     5. Outcomes: Clarified goals of care, Provided advance care  planning, and Provided psychosocial or spiritual support      PC Team follow-up plans: today      Discharge Disposition: Palliative care sign off (patient is still admitted)      Outpatient Follow Up Recommended: No Due to multiple co morbidities and limited functional status.   Discussed case with: bedside RN    69 minutes.  Total time today in care of patient including chart review, evaluation, management, counseling & coordination of care with recommendations     Lisbeth Renshaw, APRN, FNP-C  Palliative Medicine & Comprehensive Care  Phone Number: MV: [phone], Mon-Fri 9a-4p     History of Presenting Illness   Medical Records Reviewed: [place] Records via Epic (current and past), Outside Records via [employer], Outside Records via paper records, and [employer]  Program ([place] PMP)    Emily [name] is a 77 y.o. female with significant PMH of .breast Cancer s/p right mastectomy, Hypertension, Hyperlipidemia, Hypothyroidism and Vertigo. Pt was admitted to [facility] on 01/30/2023 for left sided numbness .  CT/perfusion  head, neck and brain were unremarkable , neurology consulted.    Palliative medicine and comprehensive care consult: Clarify goals of care and advance care planning    I Introduced the philosophy of palliative care as a patient and family -centered care that  optimizes quality of life throughout the continuum of illness. Palliative care addresses physical, intellectual, emotional , social , and spiritual needs of the patient and facilitate the patient's autonomy , access to information and choice.         Goals of Care   CURRENT CPR Status: Full Code       Advanced Care Planning:      Decisional Capacity: yes      Advance Directives have been completed in the past: no      Advance Directives are available in chart: no      Discussed this admission: yes      Advance Care Planning Discussion:   Provided counseling/education regarding medical advance  directive, The [place] and [place] Physician Orders for [employer] Treatment (POLST) form:   POLST forms tell your health care team what you want. They are used when you cannot communicate and you need medical care.      POLST forms are medical orders and this means that they travel with you. Wherever you are, your POLST form tells health care team what treatments you want and your goals of care, even if you transfer from hospital to nursing home, back to your home, or to hospice or another settings.      POLST forms are must be filled out and signed  by a healthcare provider (MD/NP/PA).  The patient  appreciated counseling on POLST form. However,she is not interested in completing a form at this time.    I have spent 17 minutes on face-to-face Advance Care Planning Services.    100% of the time was spent on discussion and counseling the patient.    No active management of the problems listed above was undertaken during the time reported.     Palliative Functional and Symptom Assessment      ADLs prior to admission:    Independent Needs assistance Dependent   Ambulation [x]  []  []    Transferring [x]  []  []    Dressing [x]  []  []    Bathing [x]  []  []    Toileting [x]  []  []    Feeding [x]  []  []      Palliative Performance Scale: 70% - Reduced ambulation, unable to do normal work, some evidence of disease, full self-care, normal or reduced intake, full LOC    FAST Score (Dementia Patients): N/A    [place] Symptom Assessment Scale (ESAS): Completed       Review of Systems     [x]  As per HPI and physical exam, otherwise all systems negative    Pain: not applicable  Pain Location and Description: NA  General: Fatigue, decreased energy, no F/C/S, no Headache, no insomnia  Eyes: no visual changes, eye pain, eye redness  Mouth:  no oral sores, lesions or thrush   Pulm: no cough, wheeze, or congestion, no dyspnea no pleuritic pain  Cards: no chest pain or palpitations, no edema  GI: anorexia, early satiety, constipation or  diarrhea   no abdominal pain or bloating, no weight loss  GU: no hematuria, dysuria, or incontinence, no urinary retention   Skin: no rash, lesions no skin breakdown, no puritis  MS: no new muscle weakness, joint stiffness, aches, swelling  Neuro: no focal weakness, numbness, tingling, no dizziness/vertigo   Psyche: Mood is calm no history of Anxiety or depression  Heme: no easy bleeding, bruising, petechiae       Past Medical, Surgical and Family History   Past Medical History:  Past Medical History:   Diagnosis Date    Breast cancer 2014    right breast mastectomy    Exercise-induced angina     negative work up    GERD (gastroesophageal reflux disease)     Headache     resolved    Hyperlipidemia     Hypertension     Hypothyroidism     Low back pain     Malignant neoplasm of overlapping sites of right female breast 10/19/2015    Syncope and collapse     Chronic Vertigo    Vertigo         Past Surgical History:  Past Surgical History:   Procedure Laterality Date    COLONOSCOPY, DIAGNOSTIC (SCREENING)  2021    COLONOSCOPY, REMOVAL OF TUMOR, POLYP, OR OTHER LESION BY SNARE TECHNIQUE N/A 07/21/2020    Procedure: COLONOSCOPY, POLYPECTOMY;  Surgeon: Einar Grad, MD;  Location: [facility];  Service: Gastroenterology;  Laterality: N/A;    EGD, BIOPSY N/A 07/21/2020    Procedure: EGD, BIOPSY;  Surgeon: Einar Grad, MD;  Location: [facility];  Service: Gastroenterology;  Laterality: N/A;    EXPLORATORY LAPAROTOMY N/A 05/22/2021    Procedure: EXPLORATORY LAPAROTOMY;  Surgeon: Delena Serve, MD;  Location: [facility];  Service: General;  Laterality: N/A;    HYSTERECTOMY      LAPAROSCOPY, DIAGNOSTIC N/A 05/22/2021  Procedure: LAPAROSCOPY, DIAGNOSTIC;  Surgeon: Delena Serve, MD;  Location: [facility];  Service: General;  Laterality: N/A;    LAPAROTOMY, COLECTOMY, RIGHT Right 05/22/2021    Procedure: LAPAROTOMY, COLECTOMY, RIGHT;  Surgeon: Delena Serve, MD;  Location: [facility];  Service:  General;  Laterality: Right;    MASTECTOMY Right 2015        Past Family History:  Family History   Problem Relation Age of Onset    Breast cancer Neg Hx       I have reviewed the medical records, current labs, imaging studies, progress notes and consults.  The following aspects of pertinent.   Social History   Substance Use:    reports that she has never smoked. She has never used smokeless tobacco.    reports current alcohol use.    reports no history of drug use.     Marital Status: widowed    Home/Family: 5 adult children     Cultural Concerns:    Nurse, learning disability needed: [x]  NO   []  YES                          Spirituality and Importance: [employer]      Medications   Scheduled Meds  Current Facility-Administered Medications   Medication Dose Route Frequency    amLODIPine  10 mg Oral Daily    anastrozole  1 mg Oral QAM    aspirin  81 mg Oral Daily    celecoxib  200 mg Oral Daily    ezetimibe  10 mg Oral Daily    furosemide  10 mg Oral Daily    heparin (porcine)  5,000 Units Subcutaneous Q8H [facility]    levothyroxine  25 mcg Oral Daily at 0600    lisinopril  40 mg Oral Daily    pantoprazole  40 mg Oral QAM AC    polyethylene glycol  17 g Oral Daily    rosuvastatin  40 mg Oral Daily      Drips     PRN Meds  Current Facility-Administered Medications   Medication Dose    acetaminophen  650 mg    benzocaine-menthol  1 lozenge    benzonatate  100 mg    carboxymethylcellulose sodium  1 drop    dextrose  15 g of glucose    Or    dextrose  12.5 g    Or    dextrose  12.5 g    Or    glucagon (rDNA)  1 mg    gabapentin  100 mg    HYDROcodone-acetaminophen  1 tablet    meclizine  25 mg    melatonin  3 mg    naloxone  0.2 mg    saline  2 spray       Allergies   Allergies   Allergen Reactions    Morphine Itching    Morphine And Related Itching and Nausea And Vomiting     dizzy       Physical Exam   BP 126/65   Pulse 63   Temp 98 F (36.7 C) (Rectal)   Resp 16   Ht 1.6 m (5\' 3" )   Wt 67 kg (147 lb 11.2 oz)   SpO2 98%   BMI 26.16  kg/m    Physical Exam:  General: Chronically ill-appearing ,thin with cachexia; no acute distress   HEAD:  Normal with no signs of head trauma.  EYES:  PERRLA,  EOMI, conjunctiva normal, no discharge.    EARS:  Hearing grossly intact.  NOSE: Normal.  THROAT:  Oropharynx is normal.   NECK:  Normal range of motion, no tenderness,   CHEST:  Clear breath sounds, no difficulty breathing   CARDIAC:  Regular rate and rhythm.    VASCULAR:  No Edema.    ABDOMEN: Normal and soft with no tenderness, no masses or pulsatile masses.  GASTROINTESTINAL: Bowel sounds normal  GENITOURINARY: Normal, No tenderness  MUSCULOSKELETAL:  Able to move major extremities  NEUROLOGICAL:  Alert and oriented x 3.  No focal sensory or strength deficits.   Speech normal.  Follows commands appropriately.  PSYCHIATRIC:  Normal Affect, judgement and mood.  SKIN:  Normal appearance with no rashes or lesions.      Labs / Radiology   Lab and diagnostics: reviewed in Epic  Recent Labs   Lab 01/30/23  1556   WBC 7.27   Hgb 11.1*   Hematocrit 36.1   Platelets 256       Recent Labs   Lab 01/30/23  1556   PT 11.7   PT INR 1.0   PTT 36        Recent Labs   Lab 01/30/23  1556   Sodium 139   Potassium 4.1   Chloride 103   CO2 30*   BUN 15.0   Creatinine 0.8   eGFR >60.0   Glucose 95   Calcium 9.4     Recent Labs   Lab 01/30/23  1556   Bilirubin, Total 0.3   Protein, Total 6.1   Albumin 3.6   ALT 18   AST (SGOT) 26          Chest  AP Portable    Result Date: 01/30/2023  No acute thoracic abnormality. Nonda Lou, MD 01/30/2023 4:23 PM    CTA  Head & Neck    Result Date: 01/30/2023  1.Normal CT perfusion examination of the head. There is no territorial perfusion abnormality. 2.There is no stenosis of the proximal right internal carotid artery based on NASCET criteria.  3.There is no stenosis of the proximal left internal carotid artery based on NASCET criteria. 4.The vertebrobasilar arterial system is patent. Unchanged mild stenosis of the right cervical vertebral  artery at the C4-C5 level due to surrounding degenerative changes of the cervical spine. 5.Normal intracranial CT angiogram. There is no proximal intracranial large vessel occlusion. 6.Advanced multilevel degenerative changes of the cervical spine. Milan Waunita Schooner, MD 01/30/2023 4:16 PM    CT Perfusion Brain    Result Date: 01/30/2023  1.Normal CT perfusion examination of the head. There is no territorial perfusion abnormality. 2.There is no stenosis of the proximal right internal carotid artery based on NASCET criteria.  3.There is no stenosis of the proximal left internal carotid artery based on NASCET criteria. 4.The vertebrobasilar arterial system is patent. Unchanged mild stenosis of the right cervical vertebral artery at the C4-C5 level due to surrounding degenerative changes of the cervical spine. 5.Normal intracranial CT angiogram. There is no proximal intracranial large vessel occlusion. 6.Advanced multilevel degenerative changes of the cervical spine. Milan Waunita Schooner, MD 01/30/2023 4:16 PM    CT Head WO Contrast    Result Date: 01/30/2023   1.No CT evidence of acute territorial infarction or hemorrhage. 2.Mild chronic appearing microvascular ischemic changes. Garnette Gunner, MD 01/30/2023 3:24 PM

## 2023-01-31 NOTE — Progress Notes (Signed)
The learning abilities of the patient and/or caregiver have been assessed. Today's individualized plan of care includes neuro checks, vital signs, pain management and safety. The patient or caregiver states the following personal goal related to the patient's deficit(s): "By discharge I want to be able to get better and go to my daughters house." The plan of care was discussed with the patient and/or caregiver, who agrees to it and demonstrates understanding of the disease process, risk factors, treatment plan, medications and consequences of noncompliance. All questions and concerns were addressed.

## 2023-01-31 NOTE — Progress Notes (Signed)
[facility] called patient to confirm demographics and discuss HH. Patient declined HH setup at the moment. Patient states " I'm not ready for that right now, when I get home I will call you." [facility] informed patient setup is for after discharge. Patient again repeated "when I get home I will call you." CM made aware via secure chat.

## 2023-02-01 NOTE — Progress Notes (Signed)
AMBULATORY CARE MANAGEMENT [facility] patient admitted to: [facility]     Hospital Admission/Discharge Dates:  4/29- 01/31/2023     Reason for admission to hospital: left sided numbness    Name: Emily [name]    ### Patient Details  Date of Birth: 1946/03/25  MRN: [id number]    ### Encounter Details  Arrival Date: 01/30/2023 03:05 PM EDT  Discharge Date: 01/31/2023 03:29 PM EDT  Encounter ID: [id number] TCM4/30/2024   3:29:00PM    ### Related interaction  [place] TCM Post Discharge Outreach (Post Discharge TCM Outreach 1) ([email or web address])    ### Questions     Question 1   General Status   How are you feeling now compared to when you left the hospital? If you're feeling better press 1, if you're feeling the same press 2, or if you're feeling worse press 3.   Feeling Worse (Issue Panel: Post Discharge - TCM)    ### Required Interventions and Feedback     Call Status         Comments::     Placed call to pt for TCM hospital discharge follow up. Left a voice message identifying myself and advising of purpose of call. Provided my contact information and encouraged return call to myself. JF 5/1 @09 :30    Placed call to pt for second attempt hospital discharge follow up. Left a voice message identifying myself and advising of purpose of call. Provided my contact information and encouraged return call for any questions or needs. JF 5/1 @15 :48 (edited by JF on 02/01/2023 03:48 PM EDT)      Rudolpho Sevin, RN, BSN, ACM-RN  Case Manager II  [facility]  [address]  [place], [place] [address]  (T) [phone]

## 2023-04-13 ENCOUNTER — Telehealth (INDEPENDENT_AMBULATORY_CARE_PROVIDER_SITE_OTHER): Payer: Self-pay | Admitting: Internal Medicine

## 2023-04-13 NOTE — Telephone Encounter (Signed)
This patient has been experiencing palpitations and pain in her left arm (she declined to speak with triage).  She has an appointment on 7/12 to see Dr. Loma Newton.    Phone:[phone]    [facility]  Cardiac [employer]

## 2023-04-13 NOTE — Telephone Encounter (Signed)
I called patient and she declined to discuss any symptoms and refused triage.  I asked if she has chest pain and she declined to discuss and /just said, "I will come for my visit tomorrow at 11 am" and disconnected the call.  Her appt is at [facility] at 11 a.m. with Dr. Loma Newton.

## 2023-04-14 ENCOUNTER — Ambulatory Visit (INDEPENDENT_AMBULATORY_CARE_PROVIDER_SITE_OTHER): Payer: Medicare Other | Admitting: Cardiovascular Disease

## 2023-04-20 LAB — COLOGUARD

## 2023-04-20 LAB — UNMAPPED LAB RESULTS

## 2023-04-28 LAB — COLOGUARD

## 2023-04-28 LAB — UNMAPPED LAB RESULTS

## 2023-05-12 LAB — COLOGUARD

## 2023-05-12 LAB — UNMAPPED LAB RESULTS

## 2023-08-06 ENCOUNTER — Emergency Department: Payer: Medicare Other

## 2023-08-06 ENCOUNTER — Emergency Department
Admission: EM | Admit: 2023-08-06 | Discharge: 2023-08-06 | Disposition: A | Discharge status: Home / Self Care | Payer: Medicare Other | Attending: Student in an Organized Health Care Education/Training Program | Admitting: Student in an Organized Health Care Education/Training Program

## 2023-08-06 DIAGNOSIS — R2240 Localized swelling, mass and lump, unspecified lower limb: Secondary | ICD-10-CM | POA: Insufficient documentation

## 2023-08-06 DIAGNOSIS — M7989 Other specified soft tissue disorders: Secondary | ICD-10-CM | POA: Insufficient documentation

## 2023-08-06 DIAGNOSIS — M549 Dorsalgia, unspecified: Secondary | ICD-10-CM | POA: Insufficient documentation

## 2023-08-06 DIAGNOSIS — N3000 Acute cystitis without hematuria: Secondary | ICD-10-CM | POA: Insufficient documentation

## 2023-08-06 DIAGNOSIS — N179 Acute kidney failure, unspecified: Secondary | ICD-10-CM | POA: Insufficient documentation

## 2023-08-06 LAB — URINALYSIS WITH REFLEX TO MICROSCOPIC EXAM - REFLEX TO CULTURE
Urine Bilirubin: NEGATIVE
Urine Blood: NEGATIVE
Urine Glucose: NEGATIVE
Urine Ketones: NEGATIVE mg/dL
Urine Nitrite: NEGATIVE
Urine Protein: NEGATIVE
Urine Specific Gravity: 1.009 (ref 1.001–1.035)
Urine Urobilinogen: NORMAL mg/dL (ref 0.2–2.0)
Urine pH: 6.5 (ref 5.0–8.0)

## 2023-08-06 LAB — COMPREHENSIVE METABOLIC PANEL
ALT: 16 U/L (ref 0–55)
AST (SGOT): 28 U/L (ref 5–41)
Albumin/Globulin Ratio: 1.3 (ref 0.9–2.2)
Albumin: 4.5 g/dL (ref 3.5–5.0)
Alkaline Phosphatase: 105 U/L (ref 37–117)
Anion Gap: 10 (ref 5.0–15.0)
BUN: 24 mg/dL — ABNORMAL HIGH (ref 7–21)
Bilirubin, Total: 0.4 mg/dL (ref 0.2–1.2)
CO2: 27 meq/L (ref 17–29)
Calcium: 9.9 mg/dL (ref 7.9–10.2)
Chloride: 107 meq/L (ref 99–111)
Creatinine: 1.4 mg/dL — ABNORMAL HIGH (ref 0.4–1.0)
GFR: 38.1 mL/min/{1.73_m2} — ABNORMAL LOW (ref >=60.0)
Globulin: 3.5 g/dL (ref 2.0–3.6)
Glucose: 95 mg/dL (ref 70–100)
Potassium: 4.3 meq/L (ref 3.5–5.3)
Protein, Total: 8 g/dL (ref 6.0–8.3)
Sodium: 144 meq/L (ref 135–145)

## 2023-08-06 LAB — LAB USE ONLY - CBC WITH DIFFERENTIAL
Absolute Basophils: 0.04 10*3/uL (ref 0.00–0.08)
Absolute Eosinophils: 0.13 10*3/uL (ref 0.00–0.44)
Absolute Immature Granulocytes: 0.02 10*3/uL (ref 0.00–0.07)
Absolute Lymphocytes: 1.9 10*3/uL (ref 0.42–3.22)
Absolute Monocytes: 0.5 10*3/uL (ref 0.21–0.85)
Absolute Neutrophils: 4.34 10*3/uL (ref 1.10–6.33)
Absolute nRBC: 0 10*3/uL (ref <=0.00)
Basophils %: 0.6 %
Eosinophils %: 1.9 %
Hematocrit: 39.4 % (ref 34.7–43.7)
Hemoglobin: 12.1 g/dL (ref 11.4–14.8)
Immature Granulocytes %: 0.3 %
Lymphocytes %: 27.4 %
MCH: 27.1 pg (ref 25.1–33.5)
MCHC: 30.7 g/dL — ABNORMAL LOW (ref 31.5–35.8)
MCV: 88.3 fL (ref 78.0–96.0)
MPV: 11 fL (ref 8.9–12.5)
Monocytes %: 7.2 %
Neutrophils %: 62.6 %
Platelet Count: 276 10*3/uL (ref 142–346)
Preliminary Absolute Neutrophil Count: 4.34 10*3/uL (ref 1.10–6.33)
RBC: 4.46 10*6/uL (ref 3.90–5.10)
RDW: 14 % (ref 11–15)
WBC: 6.93 10*3/uL (ref 3.10–9.50)
nRBC %: 0 /100{WBCs} (ref <=0.0)

## 2023-08-06 LAB — COVID-19 (SARS-COV-2) & INFLUENZA  A/B, NAA (ROCHE LIAT)
Influenza A RNA: NOT DETECTED
Influenza B RNA: NOT DETECTED
SARS-CoV-2 (COVID-19) RNA: NOT DETECTED

## 2023-08-06 LAB — HIGH SENSITIVITY TROPONIN-I: hs Troponin: 3.7 ng/L (ref <=14.0)

## 2023-08-06 LAB — LAB USE ONLY - URINE GRAY CULTURE HOLD TUBE

## 2023-08-06 LAB — NT-PROBNP: NT-ProBNP: 193 pg/mL (ref <450)

## 2023-08-06 LAB — LIPASE: Lipase: 20 U/L (ref 8–78)

## 2023-08-06 MED ORDER — CEPHALEXIN 500 MG PO CAPS
500.0000 mg | ORAL_CAPSULE | Freq: Two times a day (BID) | ORAL | 0 refills | Status: AC
Start: 2023-08-06 — End: 2023-08-13

## 2023-08-06 MED ORDER — STERILE WATER FOR INJECTION IJ/IV SOLN (WRAP)
1.0000 g | Freq: Once | INTRAMUSCULAR | Status: AC
Start: 2023-08-06 — End: 2023-08-06
  Administered 2023-08-06: 1 g via INTRAVENOUS
  Filled 2023-08-06: qty 1000

## 2023-08-06 MED ORDER — ACETAMINOPHEN 325 MG PO TABS
650.0000 mg | ORAL_TABLET | Freq: Once | ORAL | Status: AC
Start: 2023-08-06 — End: 2023-08-06
  Administered 2023-08-06: 650 mg via ORAL
  Filled 2023-08-06: qty 2

## 2023-08-06 NOTE — Discharge Instructions (Addendum)
Take tylenol as needed for pain.  Do not use motrin, ibuprofen, aleve products at this time.  Increase fluids.  Follow-up with PCP for re-evaluation and repeat kidney function testing.  Return immediately if worse.

## 2023-08-06 NOTE — ED Provider Notes (Signed)
 EMERGENCY DEPARTMENT NOTE     Patient initially seen and examined at     [facility] (APP) ASSIGNED       Date/Time Event User Comments    08/06/23 0905 PA/NP Provider Assigned Mikalah Skyles Elbert Ewings Dora Sims, FNP assigned as Nurse Practitioner

## 2023-08-06 NOTE — EDIE (Signed)
 [facility] NOTIFICATION 08/06/2023 08:49 Laurin, Paulo Sondi Desch DOB: 11/30/1945 MRN: [id number]     - [facility]'s patient encounter information:   VWU:?[id number]  Account [id number]  Billing Account [id number]      Criteria Met

## 2023-08-07 LAB — CULTURE, URINE: Culture Urine: NO GROWTH

## 2023-08-08 LAB — ECG 12-LEAD
Atrial Rate: 68 {beats}/min
IHS MUSE NARRATIVE AND IMPRESSION: NORMAL
P Axis: 58 degrees
P-R Interval: 186 ms
Q-T Interval: 396 ms
QRS Duration: 88 ms
QTC Calculation (Bezet): 421 ms
R Axis: 42 degrees
T Axis: 19 degrees
Ventricular Rate: 68 {beats}/min

## 2023-10-19 ENCOUNTER — Emergency Department: Payer: Medicare Other

## 2023-10-19 ENCOUNTER — Emergency Department
Admission: EM | Admit: 2023-10-19 | Discharge: 2023-10-19 | Disposition: A | Discharge status: Home / Self Care | Payer: Medicare Other | Attending: Student in an Organized Health Care Education/Training Program | Admitting: Student in an Organized Health Care Education/Training Program

## 2023-10-19 DIAGNOSIS — W009XXA Unspecified fall due to ice and snow, initial encounter: Secondary | ICD-10-CM

## 2023-10-19 DIAGNOSIS — M25551 Pain in right hip: Secondary | ICD-10-CM | POA: Insufficient documentation

## 2023-10-19 DIAGNOSIS — W000XXA Fall on same level due to ice and snow, initial encounter: Secondary | ICD-10-CM | POA: Insufficient documentation

## 2023-10-19 DIAGNOSIS — M25511 Pain in right shoulder: Secondary | ICD-10-CM | POA: Insufficient documentation

## 2023-10-19 DIAGNOSIS — R0789 Other chest pain: Secondary | ICD-10-CM | POA: Insufficient documentation

## 2023-10-19 DIAGNOSIS — M79604 Pain in right leg: Secondary | ICD-10-CM

## 2023-10-19 DIAGNOSIS — M25561 Pain in right knee: Secondary | ICD-10-CM | POA: Insufficient documentation

## 2023-10-19 DIAGNOSIS — M545 Low back pain, unspecified: Secondary | ICD-10-CM | POA: Insufficient documentation

## 2023-10-19 LAB — BASIC METABOLIC PANEL
Anion Gap: 12 (ref 5.0–15.0)
BUN: 12 mg/dL (ref 7–21)
CO2: 22 meq/L (ref 17–29)
Calcium: 9.1 mg/dL (ref 7.9–10.2)
Chloride: 107 meq/L (ref 99–111)
Creatinine: 1 mg/dL (ref 0.4–1.0)
GFR: 57 mL/min/{1.73_m2} — ABNORMAL LOW (ref >=60.0)
Glucose: 78 mg/dL (ref 70–100)
Potassium: 4 meq/L (ref 3.5–5.3)
Sodium: 141 meq/L (ref 135–145)

## 2023-10-19 MED ORDER — ACETAMINOPHEN 325 MG PO TABS
650.0000 mg | ORAL_TABLET | Freq: Once | ORAL | Status: AC
Start: 2023-10-19 — End: 2023-10-19
  Administered 2023-10-19: 650 mg via ORAL

## 2023-10-19 MED ORDER — KETOROLAC TROMETHAMINE 30 MG/ML IJ SOLN
15.0000 mg | Freq: Once | INTRAMUSCULAR | Status: AC
Start: 2023-10-19 — End: 2023-10-19
  Administered 2023-10-19: 15 mg via INTRAVENOUS
  Filled 2023-10-19: qty 1

## 2023-10-19 MED ORDER — LIDOCAINE 5 % EX PTCH
1.0000 | MEDICATED_PATCH | Freq: Once | CUTANEOUS | Status: DC
Start: 2023-10-19 — End: 2023-10-20
  Administered 2023-10-19: 1 via TRANSDERMAL
  Filled 2023-10-19: qty 1

## 2023-10-19 NOTE — Discharge Instructions (Signed)
 You have been seen today for injuries after a fall.  We do not see any signs of severe life-threatening or dangerous injuries on your examination.  Please note that you are probably going to be more sore tomorrow than you are today.  Make sure that you stay home and get some rest, take pain medicine as needed.  Follow-up with your doctor if you are still not feeling any better in the next several days.  Return immediately to the emergency room if start to feel any intractable vomiting, vision changes, numbness, tingling, weakness, fainting, chest pain, shortness of breath or any new acute changes

## 2023-10-19 NOTE — EDIE (Signed)
 [facility] NOTIFICATION 10/19/2023 17:46 Emily [name], Emily [name] DOB: [birth year] MRN: [id number]    [facility]'s patient encounter information:   FMW:?[id number]  Account [id number]  Billing Account [id number]      Criteria Met      5 ED Visits in 12 Months    Security and Safety  No Security Events were found.  ED Care Guidelines  There are currently no ED Care Guidelines for this patient. Please check your facility's medical records system.        Prescription Monitoring Program  Narx Score not available at this time.    E.D. Visit Count (12 mo.)  Facility Visits   [place] [facility] 6   Total 6   Note: Visits indicate total known visits.     Recent Emergency Department Visit Summary  Date Facility [facility] Type Diagnoses or Chief Complaint    Oct 19, 2023  [place] [facility]  [place]  Emergency      Fall      Aug 06, 2023  [place] [facility]  [place]  [place]  Emergency      Acute kidney failure, unspecified      Other specified soft tissue disorders      Acute cystitis without hematuria      Leg Swelling      WAISTE AND LEG PAIN      Jan 30, 2023  [place] [facility]  [place]  [place]  Emergency      Essential (primary) hypertension      Anesthesia of skin      Dizziness      Numbness      Leg Pains, Chest Pains      Nov 21, 2022  [place] [facility]  [place]  [place]  Emergency      Elevated blood-pressure reading, without diagnosis of hypertension      Sciatica, left side      Generalized Weakness      Nov 18, 2022  [place] [facility]  [place]  [place]  Emergency      Encounter for screening mammogram for malignant neoplasm of breast      Dizziness      Back Pain      Leg Pain      tingling; dizziness      Oct 30, 2022  [place] [facility]  [place]  [place]  Emergency      Constipation, unspecified      Dizziness and giddiness      Melena      Rectal Bleeding      Blood in Stool        Recent Inpatient Visit Summary  No Recent Inpatient Visits were found.  Care  Team  Provider Specialty Phone Fax Service Dates   [place], [facility] , DO Internal Medicine   Current    DAVIDSON, ARLEY HERO MD HERO, MD Family Medicine: Adult Medicine [phone] Current    SHAWNEE MERL FALCON, M.D. Internal Medicine   Current    JAKIE RAISIN, NP Nurse Practitioner: Adult Health   Current    JACKLYN MERRIANNE GOODNIGHT Nurse Practitioner: Gerontology [phone] Current      [facility]  This patient has registered at the [facility] [place] [facility] Emergency Department  For more information visit: [email or web address]     PLEASE NOTE:     1.   Any care recommendations  and other clinical information are provided as guidelines or for historical purposes only, and providers should exercise their own clinical judgment when providing care.    2.   You may only use this information for purposes of treatment, payment or health care operations activities, and subject to the limitations of applicable [facility] Policies.    3.   You should consult directly with the organization that provided a care guideline or other clinical history with any questions about additional information or accuracy or completeness of information provided.    ? 2025 [facility] - [email or web address]

## 2023-10-19 NOTE — ED Provider Notes (Signed)
 EMERGENCY DEPARTMENT NOTE     Patient initially seen and examined at   ED PHYSICIAN ASSIGNED       Date/Time Event User Comments    10/19/23 1900 Physician Assigned SHAUN BARROWS Shaun Barrows, MD assigned as Attending           ED MIDLEVEL (APP) ASSIGNED       None            HISTORY OF PRESENT ILLNESS       Chief Complaint: Fall       78 y.o. female with past medical history as below presenting today for fall.  Says that yesterday she was walking and slipped on ice, said that her right leg went back and she fell on her right side landing on her right leg, hip, shoulder and lightly struck her head.  Did not lose consciousness.  Not on anticoagulation.  Denies any dizziness, lightheadedness, no syncope.  Says that since then she has been having worsening pain in the right side of her body worse in the right head, shoulder, hip and knee.  Additionally has pain on the right chest.  Says that she previously had a mastectomy due to breast cancer.  No bleeding.  No open wounds.  Has been able to ambulate today.  Took Tylenol  without improvement in her pain this morning.  Denies any numbness tingling or weakness, no ataxia.  Prior to the fall did not have any lightheadedness, dizziness, chest pain, palpitations, shortness of breath or other symptoms    Independent Historian (other than patient): No  Additional History Provided by Independent Historian:  MEDICAL HISTORY     Past Medical History:  Past Medical History:   Diagnosis Date    Breast cancer 2014    right breast mastectomy    Exercise-induced angina     negative work up    GERD (gastroesophageal reflux disease)     Headache     resolved    Hyperlipidemia     Hypertension     Hypothyroidism     Low back pain     Malignant neoplasm of overlapping sites of right female breast 10/19/2015    Syncope and collapse     Chronic Vertigo    Vertigo        Past Surgical History:  Past Surgical History[1]    Social History:  Social History[2]    Family History:  Family  History[3]    Outpatient Medication:  Discharge Medication List as of 10/19/2023  9:57 PM        CONTINUE these medications which have NOT CHANGED    Details   amLODIPine  (NORVASC ) 10 MG tablet Take 1 tablet (10 mg total) by mouth daily, Starting Fri 04/02/2021, E-Rx      anastrozole  (ARIMIDEX ) 1 MG tablet Take 1 tablet (1 mg total) by mouth every morning, Starting Tue 06/01/2021, Normal      aspirin  81 MG chewable tablet Chew 1 tablet (81 mg total) by mouth daily, Starting Wed 01/29/2020, E-Rx      celecoxib  (CeleBREX ) 200 MG capsule Take 1 capsule (200 mg) by mouth daily, Historical Med      diclofenac  Sodium (VOLTAREN ) 1 % Gel topical gel Apply 2 g topically 4 (four) times daily, Starting Wed 11/17/2021, E-Rx      ezetimibe  (ZETIA ) 10 MG tablet Take 1 tablet (10 mg) by mouth daily, Starting Sat 09/17/2022, E-Rx      Ferrous Sulfate (IRON PO) Take by mouth, Historical Med  furosemide  (LASIX ) 10 MG/ML solution Take 1 mL (10 mg) by mouth daily, Historical Med      gabapentin  (NEURONTIN ) 100 MG capsule Take 1 capsule (100 mg total) by mouth 2 (two) times daily as needed (Neuropathy), Starting Tue 06/01/2021, Print      levothyroxine  (SYNTHROID ) 25 MCG tablet Take 1 tablet (25 mcg total) by mouth every morning, Starting Wed 01/29/2020, E-Rx      lisinopril  (ZESTRIL ) 40 MG tablet Take 1 tablet (40 mg total) by mouth daily, Starting Fri 04/02/2021, E-Rx      meclizine  (ANTIVERT ) 25 MG tablet Take 1 tablet (25 mg) by mouth 3 (three) times daily as needed for Dizziness, Starting Sun 10/30/2022, E-Rx      Multiple Vitamins-Minerals (MULTIVITAMIN WITH MINERALS) tablet Take 1 tablet by mouth every morning, Historical Med      pantoprazole  (PROTONIX ) 40 MG tablet Take 1 tablet (40 mg) by mouth daily, Historical Med      polyethylene glycol (MIRALAX ) 17 g packet Take 17 g by mouth daily, Starting Sun 10/30/2022, E-Rx      rosuvastatin  (CRESTOR ) 40 MG tablet Take 1 tablet (40 mg) by mouth daily, Starting Sat 09/17/2022, E-Rx                REVIEW OF SYSTEMS   Review of Systems See History of Present Illness  PHYSICAL EXAM     ED Triage Vitals   Encounter Vitals Group      BP 10/19/23 1756 (!) 145/94      Systolic BP Percentile --       Diastolic BP Percentile --       Heart Rate 10/19/23 1756 96      Resp Rate 10/19/23 1756 17      Temp 10/19/23 1756 98.2 F (36.8 C)      Temp src 10/19/23 1756 Oral      SpO2 10/19/23 1756 96 %      Weight --       Height --       Head Circumference --       Peak Flow --       Pain Score 10/19/23 1753 10      Pain Loc --       Pain Education --       Exclude from Growth Chart --      Physical Exam   Constitutional: Alert, well appearing and in no distress.   Head: Normocephalic and atraumatic. No evidence of facial trauma  Mouth/Throat: Oropharynx is clear and moist. No epistaxis or bleeding  Eyes: Conjunctivae normal. Pupils are equal and round.   Nose:  No obvious deformity.  No nasal septal hematoma  Neck: Normal range of motion. Neck supple. No midline C spine tenderness or stepoffs  Cardiovascular: Normal rate, regular rhythm. 2+ radial and dp pulses  Pulmonary/Chest: TTP R mid chest wall, s/p mastectomy. No swelling, ecchymosis, abrasions on chest wall.  Effort normal and breath sounds normal.   Abdominal: Non tender. Soft. Non distended. No lacerations, ecchymoses, abrasions  Back:  TTP lower midline L spine.  No C/T spine tenderness, bony deformities or stepoffs  Musculoskeletal: TTP to R shoulder, elbow, R hip, pain on hip flexion.  TTP R knee.  No obvious dislocation or bony deformity.  FROM intact at R knee, shoulder, elbow to flexion/extension and internal/external rotation.  No signs of trauma externally. No peripheral edema, gross deformity, to all extremities.  Neurological: Patient is alert and GCS score is 15.  Sensation intact 2/2 all extremities to light touch. Strength 5/5 in all extremities.   Skin: Skin is warm and dry.  Psychiatric: Affect normal.     MEDICAL DECISION MAKING      PRIMARY PROBLEM LIST      Acute illness/injury DIAGNOSIS:fall, R sided msk pain     Differential Diagnosis: Neck/Back trauma:   sprain, fracture, contusion, ICH, hip fx, dislocation    DISCUSSION      Patient presenting today for a fall after slip on ice.  Do not suspect syncope based on history, appears to be consistent with a mechanical fall.  Patient with diffuse tenderness on the right side on examination but particularly right posterior scalp without evidence of hematoma or skull fracture, no lacerations, does have tenderness to the right shoulder, elbow, right hip with reproducible pain on hip flexion and right knee.  Additionally has point tenderness in the right lower back and chest.  Will plan for x-rays, pain management.  Checking BMP.  Anticipate if imaging negative and able to pass a trial of ambulation can be discharged as symptoms are more likely to be musculoskeletal contusion    If patient is being hospitalized is severe sepsis or septic shock suspected?: N/A                    Additional Notes              ED Course as of 10/19/23 2340   Thu Oct 19, 2023   2122 Creatinine: 1.0 [BL]   2152 Patient passed trial of ambulation.  Stable for [place].  Feeling better but asking for one more dose of pain med prior to discharge [BL]      ED Course User Index  [BL] Shaun Barrows, MD         Vital Signs: Reviewed the patient's vital signs.   Nursing Notes: Reviewed and utilized available nursing notes.  Medical Records Reviewed: Reviewed available past medical records.  Counseling: The emergency provider has spoken with the patient and discussed today's findings, in addition to providing specific details for the plan of care.  Questions are answered and there is agreement with the plan.      CARDIAC STUDIES    The following cardiac studies were independently interpreted by me the Emergency Medicine Provider.  For full cardiac study results please see  chart.                                                  RADIOLOGY IMAGING STUDIES      Ribs Right with PA Chest   Final Result      No radiographically detectable fracture of the right-sided ribs.      Derick Clap, MD   10/19/2023 9:40 PM      Knee 4+ Views Right   Final Result         No fracture of the knee. If there is persistent pain and difficulty with   weightbearing, then further assessment with MRI or CT would be recommended   to assess for occult fracture or internal derangement.          Derick Clap, MD   10/19/2023 9:34 PM      Hip Right AP and Lateral with Pelvis   Final Result      No fracture of the right  hip or pelvis. However, assessment for   nondisplaced fracture is limited. If there is persistent pain and   difficulty weightbearing, then further assessment with MRI or CT would be   recommended to assess for occult fracture.      Derick Clap, MD   10/19/2023 9:32 PM      Elbow Right AP Lateral and Obliques   Final Result      No fracture of the elbow.      Derick Clap, MD   10/19/2023 9:31 PM      Shoulder Right 2+ Views   Final Result      1. No fracture.   2. Mild glenohumeral osteoarthritis. Small loose body adjacent to the   inferior glenoid.   3. Moderate acromioclavicular osteoarthritis.      Derick Clap, MD   10/19/2023 9:30 PM      CT L- Spine without Contrast   Final Result          1.No acute fracture or traumatic malalignment of the lumbar spine.   2.Multilevel degenerative changes. Severe facet remodeling of the right at   L5-S1 has progressed from CT of the abdomen/pelvis on 06/30/2022. This may   be degenerative but could be correlated with clinical and laboratory   evidence of inflammatory/infectious arthritis. There is potentially severe   bilateral foraminal stenosis L5-S1.   3.Slight lumbar dextrocurvature.   4.Focal fat along the thecal sac at L3. Small lipoma is possible. MRI could   further evaluate.      Lynwood Artist Gee MD, MD   10/19/2023 9:09 PM      CT Head without  Contrast   Final Result          1.No acute intracranial abnormality.   2.Atherosclerosis, mild chronic small vessel ischemic changes, and mild   brain volume loss.      Lynwood Artist Gee MD, MD   10/19/2023 9:00 PM          EMERGENCY IMAGING STUDIES    The following imagine studies were independently interpreted by me (emergency medicine provider):    Shoulder Xray Interpreted by me (ED provider) SIDE : RIGHT  Comparison: None available  RESULT: No fracture.  No dislocation. No foreign body.  IMPRESSION: No acute abnormality              EMERGENCY DEPT. MEDICATIONS      ED Medication Orders (From admission, onward)      Start Ordered     Status Ordering Provider    10/19/23 2154 10/19/23 2153  ketorolac  (TORADOL ) injection 15 mg  Once        Route: Intravenous  Ordered Dose: 15 mg       Last MAR action: Given Kayse Puccini    10/19/23 1943 10/19/23 1942  acetaminophen  (TYLENOL ) tablet 650 mg  Once        Route: Oral  Ordered Dose: 650 mg       Last MAR action: Given Ronna Herskowitz    10/19/23 1943 10/19/23 1942  lidocaine  (LIDODERM ) 5 % 1 patch  Once        Route: Transdermal  Ordered Dose: 1 patch       Last MAR action: Patch Applied Heidee Audi            LABORATORY RESULTS    Ordered and independently interpreted AVAILABLE laboratory tests.   Results       Procedure Component Value Units Date/Time    Basic Metabolic Panel [[id number]]  (  Abnormal) Collected: 10/19/23 2037    Specimen: Blood, Venous Updated: 10/19/23 2106     Glucose 78 mg/dL      BUN 12 mg/dL      Creatinine 1.0 mg/dL      Calcium  9.1 mg/dL      Sodium 858 mEq/L      Potassium 4.0 mEq/L      Chloride 107 mEq/L      CO2 22 mEq/L      Anion Gap 12.0     GFR 57.0 mL/min/1.73 m2               CRITICAL CARE/PROCEDURES    Procedures  DIAGNOSIS      Diagnosis:  Final diagnoses:   Fall on ice   Musculoskeletal chest pain   Acute pain of right shoulder   Right leg pain   Acute right-sided low back pain without sciatica       Disposition:  ED Disposition        ED Disposition   Discharge from ED Observation    Condition   --    Date/Time   Thu Oct 19, 2023  9:58 PM    Comment   --               Prescriptions:  Discharge Medication List as of 10/19/2023  9:57 PM        CONTINUE these medications which have NOT CHANGED    Details   amLODIPine  (NORVASC ) 10 MG tablet Take 1 tablet (10 mg total) by mouth daily, Starting Fri 04/02/2021, E-Rx      anastrozole  (ARIMIDEX ) 1 MG tablet Take 1 tablet (1 mg total) by mouth every morning, Starting Tue 06/01/2021, Normal      aspirin  81 MG chewable tablet Chew 1 tablet (81 mg total) by mouth daily, Starting Wed 01/29/2020, E-Rx      celecoxib  (CeleBREX ) 200 MG capsule Take 1 capsule (200 mg) by mouth daily, Historical Med      diclofenac  Sodium (VOLTAREN ) 1 % Gel topical gel Apply 2 g topically 4 (four) times daily, Starting Wed 11/17/2021, E-Rx      ezetimibe  (ZETIA ) 10 MG tablet Take 1 tablet (10 mg) by mouth daily, Starting Sat 09/17/2022, E-Rx      Ferrous Sulfate (IRON PO) Take by mouth, Historical Med      furosemide  (LASIX ) 10 MG/ML solution Take 1 mL (10 mg) by mouth daily, Historical Med      gabapentin  (NEURONTIN ) 100 MG capsule Take 1 capsule (100 mg total) by mouth 2 (two) times daily as needed (Neuropathy), Starting Tue 06/01/2021, Print      levothyroxine  (SYNTHROID ) 25 MCG tablet Take 1 tablet (25 mcg total) by mouth every morning, Starting Wed 01/29/2020, E-Rx      lisinopril  (ZESTRIL ) 40 MG tablet Take 1 tablet (40 mg total) by mouth daily, Starting Fri 04/02/2021, E-Rx      meclizine  (ANTIVERT ) 25 MG tablet Take 1 tablet (25 mg) by mouth 3 (three) times daily as needed for Dizziness, Starting Sun 10/30/2022, E-Rx      Multiple Vitamins-Minerals (MULTIVITAMIN WITH MINERALS) tablet Take 1 tablet by mouth every morning, Historical Med      pantoprazole  (PROTONIX ) 40 MG tablet Take 1 tablet (40 mg) by mouth daily, Historical Med      polyethylene glycol (MIRALAX ) 17 g packet Take 17 g by mouth daily, Starting Sun 10/30/2022, E-Rx       rosuvastatin  (CRESTOR ) 40 MG tablet Take 1 tablet (40 mg) by  mouth daily, Starting Sat 09/17/2022, E-Rx                 This note was generated by the [employer] EMR system/ Dragon speech recognition and may contain inherent errors or omissions not intended by the user. Grammatical errors, random word insertions, deletions and pronoun errors  are occasional consequences of this technology due to software limitations. Not all errors are caught or corrected. If there are questions or concerns about the content of this note or information contained within the body of this dictation they should be addressed directly with the author for clarification.           [1]   Past Surgical History:  Procedure Laterality Date    COLONOSCOPY, DIAGNOSTIC (SCREENING)  2021    COLONOSCOPY, REMOVAL OF TUMOR, POLYP, OR OTHER LESION BY SNARE TECHNIQUE N/A 07/21/2020    Procedure: COLONOSCOPY, POLYPECTOMY;  Surgeon: Jerri Nicolas, MD;  Location: [facility];  Service: Gastroenterology;  Laterality: N/A;    EGD, BIOPSY N/A 07/21/2020    Procedure: EGD, BIOPSY;  Surgeon: Jerri Nicolas, MD;  Location: [facility];  Service: Gastroenterology;  Laterality: N/A;    EXPLORATORY LAPAROTOMY N/A 05/22/2021    Procedure: EXPLORATORY LAPAROTOMY;  Surgeon: Odean Rollo HERO, MD;  Location: [facility];  Service: General;  Laterality: N/A;    HYSTERECTOMY      LAPAROSCOPY, DIAGNOSTIC N/A 05/22/2021    Procedure: LAPAROSCOPY, DIAGNOSTIC;  Surgeon: Odean Rollo HERO, MD;  Location: [facility];  Service: General;  Laterality: N/A;    LAPAROTOMY, COLECTOMY, RIGHT Right 05/22/2021    Procedure: LAPAROTOMY, COLECTOMY, RIGHT;  Surgeon: Odean Rollo HERO, MD;  Location: [facility];  Service: General;  Laterality: Right;    MASTECTOMY Right 2015   [2]   Social History  Socioeconomic History    Marital status: Widowed   Tobacco Use    Smoking status: Never    Smokeless tobacco: Never   Vaping Use    Vaping status: Never Used   Substance and Sexual  Activity    Alcohol use: Yes     Comment: occ    Drug use: No    Sexual activity: Not Currently     Social Drivers of Health     Financial Resource Strain: Low Risk  (05/12/2022)    Overall Financial Resource Strain (CARDIA)     Difficulty of Paying Living Expenses: Not hard at all   Food Insecurity: No Food Insecurity (08/06/2023)    Hunger Vital Sign     Worried About Running Out of Food in the Last Year: Never true     Ran Out of Food in the Last Year: Never true   Transportation Needs: No Transportation Needs (08/06/2023)    PRAPARE - Therapist, Art (Medical): No     Lack of Transportation (Non-Medical): No   Intimate Partner Violence: Not At Risk (08/06/2023)    Humiliation, Afraid, Rape, and Kick questionnaire     Fear of Current or Ex-Partner: No     Emotionally Abused: No     Physically Abused: No     Sexually Abused: No   Housing Stability: Low Risk  (08/06/2023)    Housing Stability Vital Sign     Unable to Pay for Housing in the Last Year: No     Number of Times Moved in the Last Year: 0     Homeless in the Last Year: No   [  3]   Family History  Problem Relation Name Age of Onset    Breast cancer Neg Hx          Shaun Barrows, MD  10/19/23 2340

## 2023-11-03 ENCOUNTER — Other Ambulatory Visit: Payer: Self-pay | Admitting: General Acute Care Hospital

## 2023-11-03 DIAGNOSIS — Z1231 Encounter for screening mammogram for malignant neoplasm of breast: Secondary | ICD-10-CM

## 2023-11-25 ENCOUNTER — Emergency Department: Payer: Medicare Other

## 2023-11-25 ENCOUNTER — Emergency Department
Admission: EM | Admit: 2023-11-25 | Discharge: 2023-11-25 | Disposition: A | Discharge status: Home / Self Care | Payer: Medicare Other | Attending: Emergency Medicine | Admitting: Emergency Medicine

## 2023-11-25 DIAGNOSIS — R791 Abnormal coagulation profile: Secondary | ICD-10-CM | POA: Insufficient documentation

## 2023-11-25 DIAGNOSIS — R42 Dizziness and giddiness: Secondary | ICD-10-CM | POA: Insufficient documentation

## 2023-11-25 DIAGNOSIS — T8089XA Other complications following infusion, transfusion and therapeutic injection, initial encounter: Secondary | ICD-10-CM | POA: Insufficient documentation

## 2023-11-25 DIAGNOSIS — R079 Chest pain, unspecified: Secondary | ICD-10-CM

## 2023-11-25 DIAGNOSIS — T801XXA Vascular complications following infusion, transfusion and therapeutic injection, initial encounter: Secondary | ICD-10-CM

## 2023-11-25 DIAGNOSIS — M79602 Pain in left arm: Secondary | ICD-10-CM | POA: Insufficient documentation

## 2023-11-25 DIAGNOSIS — Y848 Other medical procedures as the cause of abnormal reaction of the patient, or of later complication, without mention of misadventure at the time of the procedure: Secondary | ICD-10-CM | POA: Insufficient documentation

## 2023-11-25 DIAGNOSIS — R0789 Other chest pain: Secondary | ICD-10-CM | POA: Insufficient documentation

## 2023-11-25 DIAGNOSIS — Y92238 Other place in hospital as the place of occurrence of the external cause: Secondary | ICD-10-CM | POA: Insufficient documentation

## 2023-11-25 LAB — COMPREHENSIVE METABOLIC PANEL
ALT: 12 U/L (ref <=55)
AST (SGOT): 27 U/L (ref <=41)
Albumin/Globulin Ratio: 1.3 (ref 0.9–2.2)
Albumin: 3.9 g/dL (ref 3.5–5.0)
Alkaline Phosphatase: 67 U/L (ref 37–117)
Anion Gap: 5 (ref 5.0–15.0)
BUN: 20 mg/dL (ref 7–21)
Bilirubin, Total: 0.3 mg/dL (ref 0.2–1.2)
CO2: 31 meq/L — ABNORMAL HIGH (ref 17–29)
Calcium: 9.6 mg/dL (ref 7.9–10.2)
Chloride: 108 meq/L (ref 99–111)
Creatinine: 1.1 mg/dL — ABNORMAL HIGH (ref 0.4–1.0)
GFR: 50.8 mL/min/{1.73_m2} — ABNORMAL LOW (ref >=60.0)
Globulin: 3.1 g/dL (ref 2.0–3.6)
Glucose: 90 mg/dL (ref 70–100)
Potassium: 4 meq/L (ref 3.5–5.3)
Protein, Total: 7 g/dL (ref 6.0–8.3)
Sodium: 144 meq/L (ref 135–145)

## 2023-11-25 LAB — LAB USE ONLY - CBC WITH DIFFERENTIAL
Absolute Basophils: 0.03 10*3/uL (ref 0.00–0.08)
Absolute Eosinophils: 0.14 10*3/uL (ref 0.00–0.44)
Absolute Immature Granulocytes: 0.01 10*3/uL (ref 0.00–0.07)
Absolute Lymphocytes: 1.93 10*3/uL (ref 0.42–3.22)
Absolute Monocytes: 0.4 10*3/uL (ref 0.21–0.85)
Absolute Neutrophils: 3.99 10*3/uL (ref 1.10–6.33)
Absolute nRBC: 0 10*3/uL (ref <=0.00)
Basophils %: 0.5 %
Eosinophils %: 2.2 %
Hematocrit: 37.7 % (ref 34.7–43.7)
Hemoglobin: 11.6 g/dL (ref 11.4–14.8)
Immature Granulocytes %: 0.2 %
Lymphocytes %: 29.7 %
MCH: 27 pg (ref 25.1–33.5)
MCHC: 30.8 g/dL — ABNORMAL LOW (ref 31.5–35.8)
MCV: 87.7 fL (ref 78.0–96.0)
MPV: 11.9 fL (ref 8.9–12.5)
Monocytes %: 6.2 %
Neutrophils %: 61.2 %
Platelet Count: 247 10*3/uL (ref 142–346)
Preliminary Absolute Neutrophil Count: 3.99 10*3/uL (ref 1.10–6.33)
RBC: 4.3 10*6/uL (ref 3.90–5.10)
RDW: 13 % (ref 11–15)
WBC: 6.5 10*3/uL (ref 3.10–9.50)
nRBC %: 0 /100{WBCs} (ref <=0.0)

## 2023-11-25 LAB — COVID-19 (SARS-COV-2) & INFLUENZA  A/B, NAA (ROCHE LIAT)
Influenza A RNA: NOT DETECTED
Influenza B RNA: NOT DETECTED
SARS-CoV-2 (COVID-19) RNA: NOT DETECTED

## 2023-11-25 LAB — PT/INR
INR: 1 (ref 0.9–1.1)
PT: 11.2 s (ref 10.1–12.9)

## 2023-11-25 LAB — D-DIMER: D-Dimer: 2.75 ug{FEU}/mL — ABNORMAL HIGH (ref <0.50)

## 2023-11-25 LAB — APTT: PTT: 35 s (ref 27–39)

## 2023-11-25 LAB — WHOLE BLOOD GLUCOSE POCT: Whole Blood Glucose POCT: 92 mg/dL (ref 70–100)

## 2023-11-25 LAB — HIGH SENSITIVITY TROPONIN-I: hs Troponin: <2.7 ng/L (ref <=14.0)

## 2023-11-25 MED ORDER — IOHEXOL 350 MG/ML IV SOLN
75.0000 mL | Freq: Once | INTRAVENOUS | Status: DC | PRN
Start: 2023-11-25 — End: 2023-11-25

## 2023-11-25 MED ORDER — MECLIZINE HCL 12.5 MG PO TABS
25.0000 mg | ORAL_TABLET | Freq: Once | ORAL | Status: AC
Start: 2023-11-25 — End: 2023-11-25
  Administered 2023-11-25: 25 mg via ORAL
  Filled 2023-11-25: qty 2

## 2023-11-25 MED ORDER — ACETAMINOPHEN 500 MG PO TABS
1000.0000 mg | ORAL_TABLET | Freq: Once | ORAL | Status: AC
Start: 2023-11-25 — End: 2023-11-25
  Administered 2023-11-25: 1000 mg via ORAL
  Filled 2023-11-25: qty 2

## 2023-11-25 MED ORDER — MECLIZINE HCL 25 MG PO TABS
25.0000 mg | ORAL_TABLET | Freq: Three times a day (TID) | ORAL | 0 refills | Status: DC | PRN
Start: 2023-11-25 — End: 2024-02-08

## 2023-11-25 MED ORDER — TECHNETIUM TC 99M ALBUMIN AGGREGATED
6.0000 | Freq: Once | Status: AC | PRN
Start: 2023-11-25 — End: 2023-11-25
  Administered 2023-11-25: 6 via INTRAVENOUS

## 2023-11-25 NOTE — ED Provider Notes (Addendum)
 EMERGENCY DEPARTMENT NOTE     Patient initially seen and examined at   ED PHYSICIAN ASSIGNED       Date/Time Event User Comments    11/25/23 [id number] Physician Assigned Emily [name] LELON Emily [name] LELON, MD assigned as Attending           ED MIDLEVEL (APP) ASSIGNED       None            HISTORY OF PRESENT ILLNESS   Translator Used : No    Chief Complaint: Headache, Dizziness, and body pain       78 y.o. female with past medical history as below   History of Present Illness  Emily [name] is a 78 year old female who presents with worsening dizziness and chest pain.    She has been experiencing chronic dizziness, which has worsened since yesterday afternoon. The dizziness is described as occurring 'in my head' and is a frequent issue, occurring most days of the week. She does not use any assistive devices for mobility.    She reports pain in both her left and right breasts, as well as pain in the right side of her chest. The chest pain is exacerbated by taking deep breaths. She mentions having had surgery on her breasts due to cancer. The pain in her right chest has been present since earlier this week.        Independent Historian (other than patient): No  Additional History Provided by Independent Historian:  MEDICAL HISTORY     Past Medical History:  Past Medical History:   Diagnosis Date    Breast cancer 2014    right breast mastectomy    Exercise-induced angina     negative work up    GERD (gastroesophageal reflux disease)     Headache     resolved    Hyperlipidemia     Hypertension     Hypothyroidism     Low back pain     Malignant neoplasm of overlapping sites of right female breast 10/19/2015    Syncope and collapse     Chronic Vertigo    Vertigo        Past Surgical History:  Past Surgical History[1]    Social History:  Social History[2]    Family History:  Family History[3]    Outpatient Medication:  Previous Medications    AMLODIPINE  (NORVASC ) 10 MG TABLET    Take 1 tablet (10 mg total) by mouth daily     ANASTROZOLE  (ARIMIDEX ) 1 MG TABLET    Take 1 tablet (1 mg total) by mouth every morning    ASPIRIN  81 MG CHEWABLE TABLET    Chew 1 tablet (81 mg total) by mouth daily    CELECOXIB  (CELEBREX ) 200 MG CAPSULE    Take 1 capsule (200 mg) by mouth daily    DICLOFENAC  SODIUM (VOLTAREN ) 1 % GEL TOPICAL GEL    Apply 2 g topically 4 (four) times daily    EZETIMIBE  (ZETIA ) 10 MG TABLET    Take 1 tablet (10 mg) by mouth daily    FERROUS SULFATE (IRON PO)    Take by mouth    FUROSEMIDE  (LASIX ) 10 MG/ML SOLUTION    Take 1 mL (10 mg) by mouth daily    GABAPENTIN  (NEURONTIN ) 100 MG CAPSULE    Take 1 capsule (100 mg total) by mouth 2 (two) times daily as needed (Neuropathy)    LEVOTHYROXINE  (SYNTHROID ) 25 MCG TABLET    Take 1 tablet (25  mcg total) by mouth every morning    LISINOPRIL  (ZESTRIL ) 40 MG TABLET    Take 1 tablet (40 mg total) by mouth daily    MULTIPLE VITAMINS-MINERALS (MULTIVITAMIN WITH MINERALS) TABLET    Take 1 tablet by mouth every morning    PANTOPRAZOLE  (PROTONIX ) 40 MG TABLET    Take 1 tablet (40 mg) by mouth daily    POLYETHYLENE GLYCOL (MIRALAX ) 17 G PACKET    Take 17 g by mouth daily    ROSUVASTATIN  (CRESTOR ) 40 MG TABLET    Take 1 tablet (40 mg) by mouth daily         REVIEW OF SYSTEMS   Review of Systems See History of Present Illness  PHYSICAL EXAM     ED Triage Vitals   Encounter Vitals Group      BP 11/25/23 0701 167/83      Systolic BP Percentile --       Diastolic BP Percentile --       Heart Rate 11/25/23 0701 (!) 56      Resp Rate 11/25/23 0701 18      Temp 11/25/23 0701 97.7 F (36.5 C)      Temp src 11/25/23 0657 Oral      SpO2 11/25/23 0701 98 %      Weight 11/25/23 0657 71.5 kg      Height 11/25/23 0657 1.6 m      Head Circumference --       Peak Flow --       Pain Score 11/25/23 0657 10      Pain Loc --       Pain Education --       Exclude from Growth Chart --      Physical Exam   Nursing note and vitals reviewed.    Constitutional: non-toxic  Head: Atraumatic.  Eyes: PERRL. EOMI. No scleral  icterus. No nystagmus.  ENT: Mucous membranes are moist and intact. Oropharynx is clear. Patent airway.  Neck: Supple. No cervical lymphadenopathy.  Cardiovascular: Regular rate. Regular rhythm. No murmurs, rubs, or gallops.  Pulmonary/Chest: No evidence of respiratory distress. Clear to auscultation bilaterally. No wheezing, rales or rhonchi.   GI: Soft, non-distended abdomen. No tenderness to palpation of abdomen.  Extremities: No edema. No deformity.  Skin: No rash.   Neurological: Awake, alert and oriented x 3. CN II-XII intact. Strength intact. Sensation intact. Norma finger to nose. Normal gait/balance/coordination.  Psychiatric: Appropriate affect. Appropriate mood. Appropriate behavior.    MEDICAL DECISION MAKING     PRIMARY PROBLEM LIST      Acute illness/injury with risk to life or bodily function (based on differential diagnosis or evaluation) DIAGNOSIS:see below  Chronic Illness Impacting Care of the above problem: Hypertension and Advanced age Increases complexity of evaluation, Increases the risk of severe disease, and Increase the risk of disease progression      DISCUSSION      Dizziness- acute on chronic. Ambulatory here for me. Good balance. Good coordination. Doubt stroke. Getting ct head. Rule out ich, brain mass, past stroke. Multiple work ups in past for stroke like presentations with no evidence of stroke. CT head normal. I observed patient with normal gait, balance in room and later walking to bathroom. She feels better with meclizine . Referring to pcp, cardiologist for follow up.    Right sided chest pain. Pleuritic. Checking d-dimer to eval for pe. Cxr to rule out ptx, effusion, infiltrate. Cxr normal. D-dimer elevated. Cta chest ordered to rule out pe.  Unfortunately, IV that I placed in left arm infiltrated. I inspected left arm. No significant swelling. Mild tenderness. I attempted to draw back on iv. Nothing returned. I removed IV. Ice pack applied to arm. We discussed management of iv  infiltration. I obtained an EJ IV. We got VQ scan. CT refused to do CTA chest with the EJ. Low risk VQ scan. I don't think we need any additional imaging for further risk stratification of PE. Don't suspect this now. Referring to pcp, cardiologist for follow up.    Instructed patient to return to the ER immediately for new/worsening illness.    If patient is being hospitalized is severe sepsis or septic shock suspected?: N/A                  External Records Reviewed?: N/A    Additional Notes                 NIH Stroke Score      Flowsheet Row Most Recent Value   NIH Stroke Scale    Interval Baseline admission to ED   1a. Level of Consciousness 0   1b. LOC Questions (age, month) 0   1c. LOC Commands (Open and close eyes, Grip AND release good hand) 0   2. Best Gaze 0   3. Visual Fields 0   4. Facial Palsy 0   5a. Motor Left Arm: (Arms with palm down X 10 seconds. Sitting = arms at 90 degrees. Supine = arms 45 degrees) 0   5b. Motor Right Arm: (Arms with palm down X 10 seconds. Sitting = arms at 90 degrees Supine = arms 45 degrees) 0   Total Motor Arms 0   6a. Motor Left Leg: (Leg elevated X 5 seconds Supine = Leg 30 degrees) 0   6b. Motor Right Leg: (Leg elevated X 5 seconds Supine = Leg 30 degrees) 0   Total Motor Legs 0   7. Limb Ataxia (Finger to nose, heel to shin) 0   8. Sensory (Sensation or grimace to pin prick on face, arm, trunk, and leg) 1   9. Best language (Describe picture, name items, read sentences from NIHSS booklet) 0   10. Dysarthria (Read list from NIHSS booklet) 0   11. Extinction and Inattention (formerly Neglect) - Test tactile and visual stimulation 0   NIHSS Total 1            Vital Signs: Reviewed the patient's vital signs.   Nursing Notes: Reviewed and utilized available nursing notes.  Medical Records Reviewed: Reviewed available past medical records.  Counseling: The emergency provider has spoken with the patient and discussed today's findings, in addition to providing specific details  for the plan of care.  Questions are answered and there is agreement with the plan.      CARDIAC STUDIES    The following cardiac studies were independently interpreted by me the Emergency Medicine Provider.  For full cardiac study results please see chart.      EKG read by me  Time 706  Rate 50  Sinus bradycardia  No stemi  Abnormal ekg                                              RADIOLOGY IMAGING STUDIES      NM Pulmonary Vent/Perf (VQ Scan)   Final Result  1.Low probability for acute pulmonary embolism.      Lillette CHRISTELLA Gillis, MD   11/25/2023 3:02 PM      CT Head WO Contrast   Final Result          1.No acute intracranial abnormality.   2.Atherosclerosis, mild chronic small vessel ischemic changes, and   generalized brain volume loss.      Lynwood Artist Gee MD, MD   11/25/2023 8:41 AM      Chest AP Portable   Final Result      No acute cardiopulmonary process.      Sue CHRISTELLA Fila, DO   11/25/2023 8:17 AM      CT Angio Chest    (Results Pending)       EMERGENCY IMAGING STUDIES    The following imagine studies were independently interpreted by me (emergency medicine provider):    I read chest xray and appreciate no ptx                         EMERGENCY DEPT. MEDICATIONS      ED Medication Orders (From admission, onward)      Start Ordered     Status Ordering Provider    11/25/23 1438 11/25/23 1438  technetium Tc 99m  albumin  aggregated (MAA) injection 6 millicurie  IMG once as needed        Route: Intravenous  Ordered Dose: 6 millicurie       Last MAR action: Imaging Agent Given Emily [name] ORN    11/25/23 0853 11/25/23 0853  iohexol  (OMNIPAQUE ) 350 MG/ML injection 75 mL  IMG once as needed        Route: Intravenous  Ordered Dose: 75 mL       Acknowledged Loriana Samad W    11/25/23 0757 11/25/23 0756  meclizine  (ANTIVERT ) tablet 25 mg  Once        Route: Oral  Ordered Dose: 25 mg       Last MAR action: Given Aaryn Sermon W    11/25/23 0720 11/25/23 0719  acetaminophen  (TYLENOL ) tablet 1,000 mg  Once         Route: Oral  Ordered Dose: 1,000 mg       Last MAR action: Given Tori Dattilio W            LABORATORY RESULTS    Ordered and independently interpreted AVAILABLE laboratory tests.   Results       Procedure Component Value Units Date/Time    High Sensitivity Troponin-I [[id number]]  (Normal) Collected: 11/25/23 0800    Specimen: Blood, Venous Updated: 11/25/23 0849     hs Troponin <2.7 ng/L     Comprehensive Metabolic Panel [[id number]]  (Abnormal) Collected: 11/25/23 0800    Specimen: Blood, Venous Updated: 11/25/23 0842     Glucose 90 mg/dL      BUN 20 mg/dL      Creatinine 1.1 mg/dL      Sodium 855 mEq/L      Potassium 4.0 mEq/L      Chloride 108 mEq/L      CO2 31 mEq/L      Calcium  9.6 mg/dL      Anion Gap 5.0     GFR 50.8 mL/min/1.73 m2      AST (SGOT) 27 U/L      ALT 12 U/L      Alkaline Phosphatase 67 U/L      Albumin  3.9 g/dL      Protein, Total  7.0 g/dL      Globulin 3.1 g/dL      Albumin /Globulin Ratio 1.3     Bilirubin, Total 0.3 mg/dL     PT/INR [[id number]]  (Normal) Collected: 11/25/23 0800    Specimen: Blood, Venous Updated: 11/25/23 0819     PT 11.2 sec      INR 1.0    D-Dimer [[id number]]  (Abnormal) Collected: 11/25/23 0800    Specimen: Blood, Venous Updated: 11/25/23 0819     D-Dimer 2.75 ug/mL FEU     APTT [[id number]]  (Normal) Collected: 11/25/23 0800    Specimen: Blood, Venous Updated: 11/25/23 0819     PTT 35 sec     CBC with Differential (Order) [[id number]]  (Abnormal) Collected: 11/25/23 0800    Specimen: Blood, Venous Updated: 11/25/23 0810    Narrative:      The following orders were created for panel order CBC with Differential (Order).  Procedure                               Abnormality         Status                     ---------                               -----------         ------                     CBC with Differential (...[[id number]]  Abnormal            Final result                 Please view results for these tests on the individual orders.    CBC with Differential  (Component) [[id number]]  (Abnormal) Collected: 11/25/23 0800    Specimen: Blood, Venous Updated: 11/25/23 0810     WBC 6.50 x10 3/uL      Hemoglobin 11.6 g/dL      Hematocrit 62.2 %      Platelet Count 247 x10 3/uL      MPV 11.9 fL      RBC 4.30 x10 6/uL      MCV 87.7 fL      MCH 27.0 pg      MCHC 30.8 g/dL      RDW 13 %      nRBC % 0.0 /100 WBC      Absolute nRBC 0.00 x10 3/uL      Preliminary Absolute Neutrophil Count 3.99 x10 3/uL      Neutrophils % 61.2 %      Lymphocytes % 29.7 %      Monocytes % 6.2 %      Eosinophils % 2.2 %      Basophils % 0.5 %      Immature Granulocytes % 0.2 %      Absolute Neutrophils 3.99 x10 3/uL      Absolute Lymphocytes 1.93 x10 3/uL      Absolute Monocytes 0.40 x10 3/uL      Absolute Eosinophils 0.14 x10 3/uL      Absolute Basophils 0.03 x10 3/uL      Absolute Immature Granulocytes 0.01 x10 3/uL     COVID-19 (SARS-CoV-2) and Influenza A/B, NAA (Liat) [[id number]]  (Normal) Collected: 11/25/23 0732  Specimen: Swab from Anterior Nares Updated: 11/25/23 9191     SARS-CoV-2 (COVID-19) RNA Not Detected     Influenza A RNA Not Detected     Influenza B RNA Not Detected    Narrative:      A result of Detected indicates POSITIVE for the presence of viral RNA  A result of Not Detected indicates NEGATIVE for the presence of viral RNA    Test performed using the Roche cobas Liat SARS-CoV-2 & Influenza A/B assay. This is a multiplex real-time RT-PCR assay for the detection of SARS-CoV-2, influenza A, and influenza B virus RNA. Viral nucleic acids may persist in vivo, independent of viability. Detection of viral nucleic acid does not imply the presence of infectious virus, or that virus nucleic acid is the cause of clinical symptoms. Negative results do not preclude SARS-CoV-2, influenza A, and/or influenza B infection and should not be used as the sole basis for diagnosis, treatment or other patient management decisions. Invalid results may be due to inhibiting substances in the specimen  and recollection should occur.     Whole Blood Glucose POCT [[id number]]  (Normal) Collected: 11/25/23 0750    Specimen: Blood, Capillary Updated: 11/25/23 0803     Whole Blood Glucose POCT 92 mg/dL               CRITICAL CARE/PROCEDURES    Procedures    DIAGNOSIS      Diagnosis:  Final diagnoses:   Intravenous infiltration, initial encounter   Dizziness   Chest pain, unspecified type       Disposition:  ED Disposition       ED Disposition   Discharge    Condition   --    Date/Time   Sat Nov 25, 2023  3:11 PM    Comment   Kriya Westra Corsetti discharge to home/self care.    Condition at disposition: Stable                 Prescriptions:  Patient's Medications   New Prescriptions    MECLIZINE  (ANTIVERT ) 25 MG TABLET    Take 1 tablet (25 mg) by mouth 3 (three) times daily as needed for Dizziness   Previous Medications    AMLODIPINE  (NORVASC ) 10 MG TABLET    Take 1 tablet (10 mg total) by mouth daily    ANASTROZOLE  (ARIMIDEX ) 1 MG TABLET    Take 1 tablet (1 mg total) by mouth every morning    ASPIRIN  81 MG CHEWABLE TABLET    Chew 1 tablet (81 mg total) by mouth daily    CELECOXIB  (CELEBREX ) 200 MG CAPSULE    Take 1 capsule (200 mg) by mouth daily    DICLOFENAC  SODIUM (VOLTAREN ) 1 % GEL TOPICAL GEL    Apply 2 g topically 4 (four) times daily    EZETIMIBE  (ZETIA ) 10 MG TABLET    Take 1 tablet (10 mg) by mouth daily    FERROUS SULFATE (IRON PO)    Take by mouth    FUROSEMIDE  (LASIX ) 10 MG/ML SOLUTION    Take 1 mL (10 mg) by mouth daily    GABAPENTIN  (NEURONTIN ) 100 MG CAPSULE    Take 1 capsule (100 mg total) by mouth 2 (two) times daily as needed (Neuropathy)    LEVOTHYROXINE  (SYNTHROID ) 25 MCG TABLET    Take 1 tablet (25 mcg total) by mouth every morning    LISINOPRIL  (ZESTRIL ) 40 MG TABLET    Take 1 tablet (40 mg total) by mouth daily  MULTIPLE VITAMINS-MINERALS (MULTIVITAMIN WITH MINERALS) TABLET    Take 1 tablet by mouth every morning    PANTOPRAZOLE  (PROTONIX ) 40 MG TABLET    Take 1 tablet (40 mg) by mouth daily     POLYETHYLENE GLYCOL (MIRALAX ) 17 G PACKET    Take 17 g by mouth daily    ROSUVASTATIN  (CRESTOR ) 40 MG TABLET    Take 1 tablet (40 mg) by mouth daily   Modified Medications    No medications on file   Discontinued Medications    MECLIZINE  (ANTIVERT ) 25 MG TABLET    Take 1 tablet (25 mg) by mouth 3 (three) times daily as needed for Dizziness           This note was generated by the Epic EMR system/ Dragon speech recognition and may contain inherent errors or omissions not intended by the user. Grammatical errors, random word insertions, deletions and pronoun errors  are occasional consequences of this technology due to software limitations. Not all errors are caught or corrected. If there are questions or concerns about the content of this note or information contained within the body of this dictation they should be addressed directly with the author for clarification.         Emily [name] ORN, MD  11/25/23 1531         [1]   Past Surgical History:  Procedure Laterality Date    COLONOSCOPY, DIAGNOSTIC (SCREENING)  2021    COLONOSCOPY, REMOVAL OF TUMOR, POLYP, OR OTHER LESION BY SNARE TECHNIQUE N/A 07/21/2020    Procedure: COLONOSCOPY, POLYPECTOMY;  Surgeon: Jerri Nicolas, MD;  Location: [facility];  Service: Gastroenterology;  Laterality: N/A;    EGD, BIOPSY N/A 07/21/2020    Procedure: EGD, BIOPSY;  Surgeon: Jerri Nicolas, MD;  Location: [facility];  Service: Gastroenterology;  Laterality: N/A;    EXPLORATORY LAPAROTOMY N/A 05/22/2021    Procedure: EXPLORATORY LAPAROTOMY;  Surgeon: Odean Rollo HERO, MD;  Location: [facility];  Service: General;  Laterality: N/A;    HYSTERECTOMY      LAPAROSCOPY, DIAGNOSTIC N/A 05/22/2021    Procedure: LAPAROSCOPY, DIAGNOSTIC;  Surgeon: Odean Rollo HERO, MD;  Location: [facility];  Service: General;  Laterality: N/A;    LAPAROTOMY, COLECTOMY, RIGHT Right 05/22/2021    Procedure: LAPAROTOMY, COLECTOMY, RIGHT;  Surgeon: Odean Rollo HERO, MD;  Location: [facility];  Service: General;  Laterality: Right;    MASTECTOMY Right 2015   [2]   Social History  Socioeconomic History    Marital status: Widowed   Tobacco Use    Smoking status: Never    Smokeless tobacco: Never   Vaping Use    Vaping status: Never Used   Substance and Sexual Activity    Alcohol use: Yes     Comment: occ    Drug use: No    Sexual activity: Not Currently     Social Drivers of Health     Financial Resource Strain: Low Risk  (05/12/2022)    Overall Financial Resource Strain (CARDIA)     Difficulty of Paying Living Expenses: Not hard at all   Food Insecurity: No Food Insecurity (08/06/2023)    Hunger Vital Sign     Worried About Running Out of Food in the Last Year: Never true     Ran Out of Food in the Last Year: Never true   Transportation Needs: No Transportation Needs (08/06/2023)    PRAPARE - Transportation     Lack of  Transportation (Medical): No     Lack of Transportation (Non-Medical): No   Intimate Partner Violence: Not At Risk (08/06/2023)    Humiliation, Afraid, Rape, and Kick questionnaire     Fear of Current or Ex-Partner: No     Emotionally Abused: No     Physically Abused: No     Sexually Abused: No   Housing Stability: Low Risk  (08/06/2023)    Housing Stability Vital Sign     Unable to Pay for Housing in the Last Year: No     Number of Times Moved in the Last Year: 0     Homeless in the Last Year: No   [3]   Family History  Problem Relation Name Age of Onset    Breast cancer Neg Hx          Emily [name] ORN, MD  12/11/23 1048

## 2023-11-25 NOTE — ED Notes (Signed)
 Pt sent to nuclear medicine.

## 2023-11-25 NOTE — ED Triage Notes (Signed)
 patient came to ED ambulatory. patient is complaining of body weakness, body pain, headache and dizziness since yesterday. patient has history of cancer and on right arm precaution. patient took 2 tablets of tylenol at 0400h.

## 2023-11-26 LAB — ECG 12-LEAD
Atrial Rate: 50 {beats}/min
P Axis: 43 degrees
P-R Interval: 192 ms
Q-T Interval: 422 ms
QRS Duration: 92 ms
QTC Calculation (Bezet): 384 ms
R Axis: 37 degrees
T Axis: 66 degrees
Ventricular Rate: 50 {beats}/min

## 2023-11-27 ENCOUNTER — Ambulatory Visit: Payer: Medicare Other

## 2023-11-27 ENCOUNTER — Ambulatory Visit: Payer: Self-pay

## 2023-12-23 ENCOUNTER — Emergency Department: Payer: Self-pay

## 2023-12-23 ENCOUNTER — Emergency Department
Admission: EM | Admit: 2023-12-23 | Discharge: 2023-12-23 | Disposition: A | Discharge status: Home / Self Care | Payer: Self-pay | Attending: Emergency Medicine | Admitting: Emergency Medicine

## 2023-12-23 DIAGNOSIS — M51372 Other intervertebral disc degeneration, lumbosacral region with discogenic back pain and lower extremity pain: Secondary | ICD-10-CM | POA: Insufficient documentation

## 2023-12-23 DIAGNOSIS — N898 Other specified noninflammatory disorders of vagina: Secondary | ICD-10-CM | POA: Insufficient documentation

## 2023-12-23 DIAGNOSIS — W06XXXA Fall from bed, initial encounter: Secondary | ICD-10-CM | POA: Insufficient documentation

## 2023-12-23 DIAGNOSIS — W19XXXA Unspecified fall, initial encounter: Secondary | ICD-10-CM

## 2023-12-23 DIAGNOSIS — Y92003 Bedroom of unspecified non-institutional (private) residence as the place of occurrence of the external cause: Secondary | ICD-10-CM | POA: Insufficient documentation

## 2023-12-23 DIAGNOSIS — G8929 Other chronic pain: Secondary | ICD-10-CM | POA: Insufficient documentation

## 2023-12-23 DIAGNOSIS — Y9384 Activity, sleeping: Secondary | ICD-10-CM | POA: Insufficient documentation

## 2023-12-23 LAB — LAB USE ONLY - CBC WITH DIFFERENTIAL
Absolute Basophils: 0.03 10*3/uL (ref 0.00–0.08)
Absolute Eosinophils: 0.1 10*3/uL (ref 0.00–0.44)
Absolute Immature Granulocytes: 0.01 10*3/uL (ref 0.00–0.07)
Absolute Lymphocytes: 1.86 10*3/uL (ref 0.42–3.22)
Absolute Monocytes: 0.38 10*3/uL (ref 0.21–0.85)
Absolute Neutrophils: 3.99 10*3/uL (ref 1.10–6.33)
Absolute nRBC: 0 10*3/uL (ref <=0.00)
Basophils %: 0.5 %
Eosinophils %: 1.6 %
Hematocrit: 38.8 % (ref 34.7–43.7)
Hemoglobin: 11.9 g/dL (ref 11.4–14.8)
Immature Granulocytes %: 0.2 %
Lymphocytes %: 29.2 %
MCH: 27 pg (ref 25.1–33.5)
MCHC: 30.7 g/dL — ABNORMAL LOW (ref 31.5–35.8)
MCV: 88.2 fL (ref 78.0–96.0)
MPV: 12 fL (ref 8.9–12.5)
Monocytes %: 6 %
Neutrophils %: 62.5 %
Platelet Count: 264 10*3/uL (ref 142–346)
Preliminary Absolute Neutrophil Count: 3.99 10*3/uL (ref 1.10–6.33)
RBC: 4.4 10*6/uL (ref 3.90–5.10)
RDW: 14 % (ref 11–15)
WBC: 6.37 10*3/uL (ref 3.10–9.50)
nRBC %: 0 /100{WBCs} (ref <=0.0)

## 2023-12-23 LAB — COMPREHENSIVE METABOLIC PANEL
ALT: 22 U/L (ref <=55)
AST (SGOT): 32 U/L (ref <=41)
Albumin/Globulin Ratio: 1.3 (ref 0.9–2.2)
Albumin: 4.2 g/dL (ref 3.5–5.0)
Alkaline Phosphatase: 81 U/L (ref 37–117)
Anion Gap: 7 (ref 5.0–15.0)
BUN: 15 mg/dL (ref 7–21)
Bilirubin, Total: 0.3 mg/dL (ref 0.2–1.2)
CO2: 29 meq/L (ref 17–29)
Calcium: 9.4 mg/dL (ref 7.9–10.2)
Chloride: 107 meq/L (ref 99–111)
Creatinine: 1 mg/dL (ref 0.4–1.0)
GFR: 57 mL/min/{1.73_m2} — ABNORMAL LOW (ref >=60.0)
Globulin: 3.2 g/dL (ref 2.0–3.6)
Glucose: 91 mg/dL (ref 70–100)
Potassium: 4.5 meq/L (ref 3.5–5.3)
Protein, Total: 7.4 g/dL (ref 6.0–8.3)
Sodium: 143 meq/L (ref 135–145)

## 2023-12-23 LAB — URINALYSIS WITH REFLEX TO MICROSCOPIC EXAM - REFLEX TO CULTURE
Urine Bilirubin: NEGATIVE
Urine Blood: NEGATIVE
Urine Glucose: NEGATIVE
Urine Ketones: NEGATIVE mg/dL
Urine Leukocyte Esterase: NEGATIVE
Urine Nitrite: NEGATIVE
Urine Protein: NEGATIVE
Urine Specific Gravity: 1.008 (ref 1.001–1.035)
Urine Urobilinogen: NORMAL mg/dL (ref 0.2–2.0)
Urine pH: 6.5 (ref 5.0–8.0)

## 2023-12-23 LAB — LAB USE ONLY - URINE GRAY CULTURE HOLD TUBE

## 2023-12-23 MED ORDER — METHOCARBAMOL 750 MG PO TABS
750.0000 mg | ORAL_TABLET | Freq: Three times a day (TID) | ORAL | 0 refills | Status: DC | PRN
Start: 2023-12-23 — End: 2024-02-08

## 2023-12-23 MED ORDER — METRONIDAZOLE 250 MG PO TABS
500.0000 mg | ORAL_TABLET | Freq: Once | ORAL | Status: AC
Start: 2023-12-23 — End: 2023-12-23
  Administered 2023-12-23: 500 mg via ORAL
  Filled 2023-12-23: qty 2

## 2023-12-23 MED ORDER — LISINOPRIL 10 MG PO TABS
40.0000 mg | ORAL_TABLET | Freq: Once | ORAL | Status: AC
Start: 2023-12-23 — End: 2023-12-23
  Administered 2023-12-23: 40 mg via ORAL
  Filled 2023-12-23: qty 4

## 2023-12-23 MED ORDER — METHOCARBAMOL 500 MG PO TABS
1000.0000 mg | ORAL_TABLET | Freq: Once | ORAL | Status: AC
Start: 2023-12-23 — End: 2023-12-23
  Administered 2023-12-23: 1000 mg via ORAL
  Filled 2023-12-23: qty 2

## 2023-12-23 MED ORDER — METRONIDAZOLE 500 MG PO TABS
500.0000 mg | ORAL_TABLET | Freq: Two times a day (BID) | ORAL | 0 refills | Status: AC
Start: 2023-12-23 — End: 2023-12-30

## 2023-12-23 MED ORDER — KETOROLAC TROMETHAMINE 30 MG/ML IJ SOLN
15.0000 mg | Freq: Once | INTRAMUSCULAR | Status: AC
Start: 2023-12-23 — End: 2023-12-23
  Administered 2023-12-23: 15 mg via INTRAVENOUS
  Filled 2023-12-23: qty 1

## 2023-12-23 MED ORDER — LIDOCAINE 5 % EX PTCH
1.0000 | MEDICATED_PATCH | CUTANEOUS | 0 refills | Status: DC
Start: 2023-12-23 — End: 2024-02-08

## 2023-12-23 MED ORDER — LIDOCAINE 5 % EX PTCH
2.0000 | MEDICATED_PATCH | CUTANEOUS | Status: DC
Start: 2023-12-23 — End: 2023-12-23
  Administered 2023-12-23: 2 via TRANSDERMAL
  Filled 2023-12-23: qty 2

## 2023-12-23 NOTE — ED Notes (Signed)
 EMERGENCY DEPARTMENT ATTENDING PHYSICIAN NOTE     I performed the substantive portion of the MDM. For the problems addressed, I personally developed, reviewed, and/or approved the plan and assessment as documented by the APP.  Patient was seen by APP, see their note for further documentation of history and exam.    BRIEF HISTORY OF PRESENT ILLNESS AND ADDITIONAL EXAM FINDINGS     Chief Complaint: Fall and Urinary Tract Infection Symptoms       History per APP    78 y.o. female presents with generalized weakness after a fall out of bed.  She rolled out of bed.  She reports some dysuria, vaginal discharge and odor.    Triage Vitals:  ED Triage Vitals [12/23/23 1149]   Encounter Vitals Group      BP 178/79      Systolic BP Percentile       Diastolic BP Percentile       Heart Rate (!) 56      Resp Rate 16      Temp 98.8 F (37.1 C)      Temp src Oral      SpO2 97 %      Weight 76.2 kg      Height 1.626 m      Head Circumference       Peak Flow       Pain Score 8      Pain Loc       Pain Education       Exclude from Growth Chart             MEDICAL DECISION MAKING     Patient with fall out of bed and dysuria with vaginal odor.  No injuries identified.  No UTI.  Will cover for BV/trich but patient declines rocephin and azithro pending STI testing.  Will arrange close follow-up.  Given follow-up and return instructions.      Vital Signs: Reviewed the patient's vital signs.   Nursing Notes: Reviewed and utilized available nursing notes.  Medical Records Reviewed: Reviewed available past medical records.    CARDIAC STUDIES    The following cardiac studies were independently interpreted by me the Emergency Medicine Physician.  For full cardiac study results please see chart. I discussed testing results with the APP.    EMERGENCY IMAGING STUDIES    The following imagine studies were independently interpreted by me (emergency medicine physician). I discussed testing results with the APP.    CT Head Interpreted by me (ED  Provider)  RESULT: No Hemorrhage or Mass Effect  IMPRESSION: No acute abnormality    RADIOLOGY IMAGING STUDIES      CT L- Spine without Contrast   Final Result       1.No acute lumbar spine pathology.   2.Severe facet > disc degeneration again causes severe right neural   foraminal stenosis at L5-S1.      Dannielle Burn   12/23/2023 1:35 PM      CT Head without Contrast   Final Result       1.No acute intracranial pathology.      Dannielle Burn   12/23/2023 1:00 PM      CT Cervical Spine without Contrast   Final Result       1.No acute cervical spine pathology.   2.Severe multilevel disc > facet degeneration.      Dannielle Burn   12/23/2023 1:19 PM          EMERGENCY DEPT. MEDICATIONS  ED Medication Orders (From admission, onward)      Start Ordered     Status Ordering Provider    12/23/23 1445 12/23/23 1444  lisinopril (ZESTRIL) tablet 40 mg  Once        Route: Oral  Ordered Dose: 40 mg       Last MAR action: Given Mylo Driskill K    12/23/23 1436 12/23/23 1435  metroNIDAZOLE (FLAGYL) tablet 500 mg  Once        Route: Oral  Ordered Dose: 500 mg       Last MAR action: Given RATLIFF, SHEILA B    12/23/23 1216 12/23/23 1215  ketorolac (TORADOL) injection 15 mg  Once        Route: Intravenous  Ordered Dose: 15 mg       Last MAR action: Given RATLIFF, SHEILA B    12/23/23 1216 12/23/23 1215    Every 24 hours        Route: Transdermal  Ordered Dose: 2 patch       Discontinued RATLIFF, SHEILA B    12/23/23 1216 12/23/23 1215  methocarbamol (ROBAXIN) tablet 1,000 mg  Once        Route: Oral  Ordered Dose: 1,000 mg       Last MAR action: Given RATLIFF, SHEILA B            LABORATORY RESULTS    Ordered and independently interpreted AVAILABLE laboratory tests.   Results       Procedure Component Value Units Date/Time    Comprehensive Metabolic Panel [[id number]]  (Abnormal) Collected: 12/23/23 1341    Specimen: Blood, Venous Updated: 12/23/23 1427     Glucose 91 mg/dL      BUN 15 mg/dL      Creatinine 1.0 mg/dL      Sodium 130  mEq/L      Potassium 4.5 mEq/L      Chloride 107 mEq/L      CO2 29 mEq/L      Calcium 9.4 mg/dL      Anion Gap 7.0     GFR 57.0 mL/min/1.73 m2      AST (SGOT) 32 U/L      ALT 22 U/L      Alkaline Phosphatase 81 U/L      Albumin 4.2 g/dL      Protein, Total 7.4 g/dL      Globulin 3.2 g/dL      Albumin/Globulin Ratio 1.3     Bilirubin, Total 0.3 mg/dL     CBC with Differential (Order) [[id number]]  (Abnormal) Collected: 12/23/23 1341    Specimen: Blood, Venous Updated: 12/23/23 1417    Narrative:      The following orders were created for panel order CBC with Differential (Order).  Procedure                               Abnormality         Status                     ---------                               -----------         ------                     CBC with Differential (.Marland KitchenMarland Kitchen[  [id number]]  Abnormal            Final result                 Please view results for these tests on the individual orders.    CBC with Differential (Component) [[id number]]  (Abnormal) Collected: 12/23/23 1341    Specimen: Blood, Venous Updated: 12/23/23 1417     WBC 6.37 x10 3/uL      Hemoglobin 11.9 g/dL      Hematocrit 57.8 %      Platelet Count 264 x10 3/uL      MPV 12.0 fL      RBC 4.40 x10 6/uL      MCV 88.2 fL      MCH 27.0 pg      MCHC 30.7 g/dL      RDW 14 %      nRBC % 0.0 /100 WBC      Absolute nRBC 0.00 x10 3/uL      Preliminary Absolute Neutrophil Count 3.99 x10 3/uL      Neutrophils % 62.5 %      Lymphocytes % 29.2 %      Monocytes % 6.0 %      Eosinophils % 1.6 %      Basophils % 0.5 %      Immature Granulocytes % 0.2 %      Absolute Neutrophils 3.99 x10 3/uL      Absolute Lymphocytes 1.86 x10 3/uL      Absolute Monocytes 0.38 x10 3/uL      Absolute Eosinophils 0.10 x10 3/uL      Absolute Basophils 0.03 x10 3/uL      Absolute Immature Granulocytes 0.01 x10 3/uL     Urinalysis with Reflex to Microscopic Exam and Culture [[id number]]  (Normal) Collected: 12/23/23 1230    Specimen: Urine, Clean Catch Updated: 12/23/23 1252      Urine Color Colorless     Urine Clarity Clear     Urine Specific Gravity 1.008     Urine pH 6.5     Urine Leukocyte Esterase Negative     Urine Nitrite Negative     Urine Protein Negative     Urine Glucose Negative     Urine Ketones Negative mg/dL      Urine Urobilinogen Normal mg/dL      Urine Bilirubin Negative     Urine Blood Negative    Narrative:      Urine Microscopic not indicated    Chlamydia trachomatis, Neisseria gonorrhea and Trichomonas vaginalis, PCR [[id number]] Collected: 12/23/23 1230    Specimen: Urine, First Catch Updated: 12/23/23 1248              CRITICAL CARE/PROCEDURES        DIAGNOSIS    I (ED Physician) discussed final disposition with the APP    Diagnosis:  Final diagnoses:   Chronic right-sided low back pain with right-sided sciatica   Fall, initial encounter   Vaginal discharge       Disposition:  ED Disposition       ED Disposition   Discharge from ED Observation    Condition   --    Date/Time   Sat Dec 23, 2023  2:36 PM    Comment   --               Prescriptions:  Discharge Medication List as of 12/23/2023  2:38 PM        START taking these medications  Details   lidocaine (LIDODERM) 5 % Place 1 patch onto the skin in the morning. Remove & Discard patch within 12 hours or as directed by MD., Starting Sat 12/23/2023, E-Rx      methocarbamol (ROBAXIN) 750 MG tablet Take 1 tablet (750 mg) by mouth 3 (three) times daily as needed (for spasm), Starting Sat 12/23/2023, Normal      metroNIDAZOLE (FLAGYL) 500 MG tablet Take 1 tablet (500 mg) by mouth 2 (two) times daily for 7 days, Starting Sat 12/23/2023, Until Sat 12/30/2023, E-Rx           CONTINUE these medications which have NOT CHANGED    Details   amLODIPine (NORVASC) 10 MG tablet Take 1 tablet (10 mg total) by mouth daily, Starting Fri 04/02/2021, E-Rx      anastrozole (ARIMIDEX) 1 MG tablet Take 1 tablet (1 mg total) by mouth every morning, Starting Tue 06/01/2021, Normal      aspirin 81 MG chewable tablet Chew 1 tablet (81 mg total) by  mouth daily, Starting Wed 01/29/2020, E-Rx      celecoxib (CeleBREX) 200 MG capsule Take 1 capsule (200 mg) by mouth daily, Historical Med      diclofenac Sodium (VOLTAREN) 1 % Gel topical gel Apply 2 g topically 4 (four) times daily, Starting Wed 11/17/2021, E-Rx      ezetimibe (ZETIA) 10 MG tablet Take 1 tablet (10 mg) by mouth daily, Starting Sat 09/17/2022, E-Rx      Ferrous Sulfate (IRON PO) Take by mouth, Historical Med      furosemide (LASIX) 10 MG/ML solution Take 1 mL (10 mg) by mouth daily, Historical Med      gabapentin (NEURONTIN) 100 MG capsule Take 1 capsule (100 mg total) by mouth 2 (two) times daily as needed (Neuropathy), Starting Tue 06/01/2021, Print      levothyroxine (SYNTHROID) 25 MCG tablet Take 1 tablet (25 mcg total) by mouth every morning, Starting Wed 01/29/2020, E-Rx      lisinopril (ZESTRIL) 40 MG tablet Take 1 tablet (40 mg total) by mouth daily, Starting Fri 04/02/2021, E-Rx      meclizine (ANTIVERT) 25 MG tablet Take 1 tablet (25 mg) by mouth 3 (three) times daily as needed for Dizziness, Starting Sat 11/25/2023, E-Rx      Multiple Vitamins-Minerals (MULTIVITAMIN WITH MINERALS) tablet Take 1 tablet by mouth every morning, Historical Med      pantoprazole (PROTONIX) 40 MG tablet Take 1 tablet (40 mg) by mouth daily, Historical Med      polyethylene glycol (MIRALAX) 17 g packet Take 17 g by mouth daily, Starting Sun 10/30/2022, E-Rx      rosuvastatin (CRESTOR) 40 MG tablet Take 1 tablet (40 mg) by mouth daily, Starting Sat 09/17/2022, E-Rx                Annie Paras, MD  12/24/23 [phone]

## 2023-12-23 NOTE — Discharge Instructions (Addendum)
 Please follow-up with Ortho and the pain appears worse.  Sending symptom  If you develop any new or please return to the emergency department.

## 2023-12-23 NOTE — EDIE (Signed)
 [facility] NOTIFICATION 12/23/2023 11:49 Emily [name], Emily [name] DOB: Oct 25, 1945 MRN: [id number]    [facility]'s patient encounter information:   VWU:[id number]  Account [id number]  Billing Account [id number]      Criteria Met      5 ED Visits in 12 Months    Security and Safety  No Security Events were found.  ED Care Guidelines  There are currently no ED Care Guidelines for this patient. Please check your facility's medical records system.        Prescription Monitoring Program  Narx Score not available at this time.    E.D. Visit Count (12 mo.)  Facility Visits   [place] [facility] 5   Total 5   Note: Visits indicate total known visits.     Recent Emergency Department Visit Summary  Date Facility [facility] Type Diagnoses or Chief Complaint    Dec 23, 2023  [facility]  [place]  [place]  Emergency      medic      Nov 25, 2023  [facility] [facility]  [place]  [place]  Emergency      Chest pain, unspecified      Dizziness and giddiness      Vascular complications following infusion, transfusion and therapeutic injection, initial encounter      Headache      Dizziness      BODY PAIN      Oct 19, 2023  [place] [facility]  [place]  [place]  Emergency      Low back pain, unspecified      Pain in right leg      Pain in right shoulder      Other chest pain      Unspecified fall due to ice and snow, initial encounter      Fall      Aug 06, 2023  [place] [facility]  [place]  [place]  Emergency      Acute kidney failure, unspecified      Other specified soft tissue disorders      Acute cystitis without hematuria      Leg Swelling      WAISTE AND LEG PAIN      Jan 30, 2023  [place] [facility]  [place]  [place]  Emergency      Essential (primary) hypertension      Anesthesia of skin      Dizziness      Numbness      Leg Pains, Chest Pains        Recent Inpatient Visit Summary  No Recent Inpatient Visits were found.  Care Team  Provider Specialty Phone Fax Service Dates   Martin, [facility] , DO  Internal Medicine   Current    DAVIDSON, Janet Berlin MD Judie Petit, MD Family Medicine: Adult Medicine [phone] Current    Santa Lighter, M.D. Internal Medicine   Current    Cornelius Moras, NP Nurse Practitioner: Adult Health [phone]  Current    Sandria Bales Nurse Practitioner: Gerontology [phone] Current      [facility]  This patient has registered at the [facility] Emergency Department  For more information visit: [email or web address]     PLEASE NOTE:     1.   Any care recommendations and other clinical information are provided as guidelines or for historical purposes only, and providers should exercise their  own clinical judgment when providing care.    2.   You may only use this information for purposes of treatment, payment or health care operations activities, and subject to the limitations of applicable [facility] Policies.    3.   You should consult directly with the organization that provided a care guideline or other clinical history with any questions about additional information or accuracy or completeness of information provided.    ? 2025 [facility] - [email or web address]

## 2023-12-23 NOTE — ED Provider Notes (Signed)
 EMERGENCY DEPARTMENT NOTE     Patient initially seen and examined at   ED PHYSICIAN ASSIGNED       None           ED MIDLEVEL (APP) ASSIGNED       Date/Time Event User Comments    12/23/23 1153 PA/NP Provider Assigned Yanina Knupp, Rulon Eisenmenger, FNP assigned as Nurse Practitioner            HISTORY OF PRESENT ILLNESS   Translator Used : No    Chief Complaint: Fall and Urinary Tract Infection Symptoms       History of Present Illness  The patient, with a history of cancer and chronic pain, presents with increased pain and weakness following a fall from bed. The patient reports waking up to go to the bathroom and falling while trying to get up. The patient hit the side of her head during the fall and is now experiencing pain in the front of her head and the underside of her neck. The patient also reports feeling very weak and nauseous since the fall. The patient's chronic pain is typically managed with Tylenol.  Patient states that she also has vaginal discharge and odor when she urinates.  She states she is sexually active.  Does not answer further questions regarding with multiple partners or concern for STI.    Independent Historian (other than patient): EMS  Additional History Provided by Independent Historian:  MEDICAL HISTORY     Past Medical History:  Past Medical History:   Diagnosis Date    Breast cancer (CMS/HCC) 2014    right breast mastectomy    Exercise-induced angina     negative work up    GERD (gastroesophageal reflux disease)     Headache     resolved    Hyperlipidemia     Hypertension     Hypothyroidism     Low back pain     Malignant neoplasm of overlapping sites of right female breast (CMS/HCC) 10/19/2015    Syncope and collapse     Chronic Vertigo    Vertigo        Past Surgical History:  Past Surgical History[1]    Social History:  Social History[2]     Family History:  Family History[3]    Outpatient Medication:  Discharge Medication List as of 12/23/2023  2:38 PM        CONTINUE these  medications which have NOT CHANGED    Details   amLODIPine (NORVASC) 10 MG tablet Take 1 tablet (10 mg total) by mouth daily, Starting Fri 04/02/2021, E-Rx      anastrozole (ARIMIDEX) 1 MG tablet Take 1 tablet (1 mg total) by mouth every morning, Starting Tue 06/01/2021, Normal      aspirin 81 MG chewable tablet Chew 1 tablet (81 mg total) by mouth daily, Starting Wed 01/29/2020, E-Rx      celecoxib (CeleBREX) 200 MG capsule Take 1 capsule (200 mg) by mouth daily, Historical Med      diclofenac Sodium (VOLTAREN) 1 % Gel topical gel Apply 2 g topically 4 (four) times daily, Starting Wed 11/17/2021, E-Rx      ezetimibe (ZETIA) 10 MG tablet Take 1 tablet (10 mg) by mouth daily, Starting Sat 09/17/2022, E-Rx      Ferrous Sulfate (IRON PO) Take by mouth, Historical Med      furosemide (LASIX) 10 MG/ML solution Take 1 mL (10 mg) by mouth daily, Historical Med      gabapentin (  NEURONTIN) 100 MG capsule Take 1 capsule (100 mg total) by mouth 2 (two) times daily as needed (Neuropathy), Starting Tue 06/01/2021, Print      levothyroxine (SYNTHROID) 25 MCG tablet Take 1 tablet (25 mcg total) by mouth every morning, Starting Wed 01/29/2020, E-Rx      lisinopril (ZESTRIL) 40 MG tablet Take 1 tablet (40 mg total) by mouth daily, Starting Fri 04/02/2021, E-Rx      meclizine (ANTIVERT) 25 MG tablet Take 1 tablet (25 mg) by mouth 3 (three) times daily as needed for Dizziness, Starting Sat 11/25/2023, E-Rx      Multiple Vitamins-Minerals (MULTIVITAMIN WITH MINERALS) tablet Take 1 tablet by mouth every morning, Historical Med      pantoprazole (PROTONIX) 40 MG tablet Take 1 tablet (40 mg) by mouth daily, Historical Med      polyethylene glycol (MIRALAX) 17 g packet Take 17 g by mouth daily, Starting Sun 10/30/2022, E-Rx      rosuvastatin (CRESTOR) 40 MG tablet Take 1 tablet (40 mg) by mouth daily, Starting Sat 09/17/2022, E-Rx               REVIEW OF SYSTEMS   Review of Systems See History of Present Illness  PHYSICAL EXAM     ED Triage Vitals    Encounter Vitals Group      BP       Systolic BP Percentile       Diastolic BP Percentile       Pulse       Resp       Temp       Temp src       SpO2       Weight       Height       Head Circumference       Peak Flow       Pain Score       Pain Loc       Pain Education       Exclude from Growth Chart      Physical Exam  Vitals and nursing note reviewed.   Constitutional:       General: She is not in acute distress.     Appearance: Normal appearance.   HENT:      Head: Normocephalic and atraumatic.      Nose: Nose normal.      Mouth/Throat:      Mouth: Mucous membranes are moist.   Eyes:      Extraocular Movements: Extraocular movements intact.   Pulmonary:      Effort: Pulmonary effort is normal.   Musculoskeletal:      Cervical back: Neck supple.      Comments: Paraspinal cervical and lumbar tenderness   Skin:     General: Skin is dry.   Neurological:      General: No focal deficit present.      Mental Status: She is alert. Mental status is at baseline.      Comments: ambulatory   Psychiatric:         Mood and Affect: Mood normal.          MEDICAL DECISION MAKING     PRIMARY PROBLEM LIST      Acute illness/injury with risk to life or bodily function (based on differential diagnosis or evaluation) DIAGNOSIS: Fall, back pain, neck pain, head pain  Chronic Illness Impacting Care of the above problem: Hypertension, Cancer, and Advanced age Increases complexity of evaluation and Increases the  risk of severe disease  Differential Diagnosis including but not limited to: ICH, fracture, infection    DISCUSSION      78 year old female presenting for fall out of bed with right body pain and vaginal discharge.  CTs are negative for ICH or fracture.  Patient states that she has chronic right-sided body pain she typically only takes Tylenol for the pain, will give referral for pain specialist.  Slightly decreased renal function on blood work but is consistent with patient's previous values.  UA is negative for infection, STI  testing sent.  Patient states she would like to defer treatment for STIs until test results, will prophylactically cover for BV.  Patient is concerned about returning home states that she has a home health aide but she has trouble paying her and that she comes in frequently.  Upon chart review patient has semifrequent ED visits will place referral for ED dispatch health to home.  Discussed follow-up and return precautions.  Patient participate in plan of care and is agreeable to disposition.      External Records Reviewed?: Inpatient Records and Other (explain)    Discussed case with attending, Dr. Ermelinda Das,  who is agreeable to plan of care, treatment, and disposition.       Vital Signs: Reviewed the patient's vital signs.   Nursing Notes: Reviewed and utilized available nursing notes.  Medical Records Reviewed: Reviewed available past medical records.  Counseling: The emergency provider has spoken with the patient and discussed today's findings, in addition to providing specific details for the plan of care.  Questions are answered and there is agreement with the plan.        RADIOLOGY IMAGING STUDIES      CT L- Spine without Contrast   Final Result       1.No acute lumbar spine pathology.   2.Severe facet > disc degeneration again causes severe right neural   foraminal stenosis at L5-S1.      Dannielle Burn   12/23/2023 1:35 PM      CT Head without Contrast   Final Result       1.No acute intracranial pathology.      Dannielle Burn   12/23/2023 1:00 PM      CT Cervical Spine without Contrast   Final Result       1.No acute cervical spine pathology.   2.Severe multilevel disc > facet degeneration.      Dannielle Burn   12/23/2023 1:19 PM          EMERGENCY IMAGING STUDIES    The following imagine studies were independently interpreted by me (emergency medicine provider):    CT Head Interpreted by me (ED Provider)  Comparison: Yes.  No acute changes.  DATE: 11/25/23  RESULT: No Hemorrhage or Mass Effect  IMPRESSION: No acute  abnormality    EMERGENCY DEPT. MEDICATIONS      ED Medication Orders (From admission, onward)      Start Ordered     Status Ordering Provider    12/23/23 1445 12/23/23 1444  lisinopril (ZESTRIL) tablet 40 mg  Once        Route: Oral  Ordered Dose: 40 mg       Last MAR action: Given OLDEROG, CAMERON K    12/23/23 1436 12/23/23 1435  metroNIDAZOLE (FLAGYL) tablet 500 mg  Once        Route: Oral  Ordered Dose: 500 mg       Last MAR action: Given Ayo Smoak B  12/23/23 1216 12/23/23 1215  ketorolac (TORADOL) injection 15 mg  Once        Route: Intravenous  Ordered Dose: 15 mg       Last MAR action: Given Ashea Winiarski B    12/23/23 1216 12/23/23 1215  lidocaine (LIDODERM) 5 % 2 patch  Every 24 hours        Route: Transdermal  Ordered Dose: 2 patch       Last MAR action: Patch Applied Arriah Wadle B    12/23/23 1216 12/23/23 1215  methocarbamol (ROBAXIN) tablet 1,000 mg  Once        Route: Oral  Ordered Dose: 1,000 mg       Last MAR action: Given Renita Brocks B            LABORATORY RESULTS    Ordered and independently interpreted AVAILABLE laboratory tests.   Results  LABS       Results       Procedure Component Value Units Date/Time    Comprehensive Metabolic Panel [[id number]]  (Abnormal) Collected: 12/23/23 1341    Specimen: Blood, Venous Updated: 12/23/23 1427     Glucose 91 mg/dL      BUN 15 mg/dL      Creatinine 1.0 mg/dL      Sodium 098 mEq/L      Potassium 4.5 mEq/L      Chloride 107 mEq/L      CO2 29 mEq/L      Calcium 9.4 mg/dL      Anion Gap 7.0     GFR 57.0 mL/min/1.73 m2      AST (SGOT) 32 U/L      ALT 22 U/L      Alkaline Phosphatase 81 U/L      Albumin 4.2 g/dL      Protein, Total 7.4 g/dL      Globulin 3.2 g/dL      Albumin/Globulin Ratio 1.3     Bilirubin, Total 0.3 mg/dL     CBC with Differential (Order) [[id number]]  (Abnormal) Collected: 12/23/23 1341    Specimen: Blood, Venous Updated: 12/23/23 1417    Narrative:      The following orders were created for panel order CBC with  Differential (Order).  Procedure                               Abnormality         Status                     ---------                               -----------         ------                     CBC with Differential (...[[id number]]  Abnormal            Final result                 Please view results for these tests on the individual orders.    CBC with Differential (Component) [[id number]]  (Abnormal) Collected: 12/23/23 1341    Specimen: Blood, Venous Updated: 12/23/23 1417     WBC 6.37 x10 3/uL      Hemoglobin 11.9 g/dL      Hematocrit 13.2 %  Platelet Count 264 x10 3/uL      MPV 12.0 fL      RBC 4.40 x10 6/uL      MCV 88.2 fL      MCH 27.0 pg      MCHC 30.7 g/dL      RDW 14 %      nRBC % 0.0 /100 WBC      Absolute nRBC 0.00 x10 3/uL      Preliminary Absolute Neutrophil Count 3.99 x10 3/uL      Neutrophils % 62.5 %      Lymphocytes % 29.2 %      Monocytes % 6.0 %      Eosinophils % 1.6 %      Basophils % 0.5 %      Immature Granulocytes % 0.2 %      Absolute Neutrophils 3.99 x10 3/uL      Absolute Lymphocytes 1.86 x10 3/uL      Absolute Monocytes 0.38 x10 3/uL      Absolute Eosinophils 0.10 x10 3/uL      Absolute Basophils 0.03 x10 3/uL      Absolute Immature Granulocytes 0.01 x10 3/uL     Urinalysis with Reflex to Microscopic Exam and Culture [[id number]]  (Normal) Collected: 12/23/23 1230    Specimen: Urine, Clean Catch Updated: 12/23/23 1252     Urine Color Colorless     Urine Clarity Clear     Urine Specific Gravity 1.008     Urine pH 6.5     Urine Leukocyte Esterase Negative     Urine Nitrite Negative     Urine Protein Negative     Urine Glucose Negative     Urine Ketones Negative mg/dL      Urine Urobilinogen Normal mg/dL      Urine Bilirubin Negative     Urine Blood Negative    Narrative:      Urine Microscopic not indicated    Chlamydia trachomatis, Neisseria gonorrhea and Trichomonas vaginalis, PCR [[id number]] Collected: 12/23/23 1230    Specimen: Urine, First Catch Updated: 12/23/23 1248     Urine Gray Culture Hold Tube [[id number]] Collected: 12/23/23 1230    Specimen: Urine, Clean Catch Updated: 12/23/23 1248              CRITICAL CARE/PROCEDURES    Procedures    DIAGNOSIS      Diagnosis:  Final diagnoses:   Chronic right-sided low back pain with right-sided sciatica   Fall, initial encounter   Vaginal discharge       Disposition:  ED Disposition       ED Disposition   Discharge from ED Observation    Condition   --    Date/Time   Sat Dec 23, 2023  2:36 PM    Comment   --               Prescriptions:  Discharge Medication List as of 12/23/2023  2:38 PM        START taking these medications    Details   lidocaine (LIDODERM) 5 % Place 1 patch onto the skin in the morning. Remove & Discard patch within 12 hours or as directed by MD., Starting Sat 12/23/2023, E-Rx      methocarbamol (ROBAXIN) 750 MG tablet Take 1 tablet (750 mg) by mouth 3 (three) times daily as needed (for spasm), Starting Sat 12/23/2023, Normal      metroNIDAZOLE (FLAGYL) 500 MG tablet Take 1 tablet (500 mg) by mouth  2 (two) times daily for 7 days, Starting Sat 12/23/2023, Until Sat 12/30/2023, E-Rx           CONTINUE these medications which have NOT CHANGED    Details   amLODIPine (NORVASC) 10 MG tablet Take 1 tablet (10 mg total) by mouth daily, Starting Fri 04/02/2021, E-Rx      anastrozole (ARIMIDEX) 1 MG tablet Take 1 tablet (1 mg total) by mouth every morning, Starting Tue 06/01/2021, Normal      aspirin 81 MG chewable tablet Chew 1 tablet (81 mg total) by mouth daily, Starting Wed 01/29/2020, E-Rx      celecoxib (CeleBREX) 200 MG capsule Take 1 capsule (200 mg) by mouth daily, Historical Med      diclofenac Sodium (VOLTAREN) 1 % Gel topical gel Apply 2 g topically 4 (four) times daily, Starting Wed 11/17/2021, E-Rx      ezetimibe (ZETIA) 10 MG tablet Take 1 tablet (10 mg) by mouth daily, Starting Sat 09/17/2022, E-Rx      Ferrous Sulfate (IRON PO) Take by mouth, Historical Med      furosemide (LASIX) 10 MG/ML solution Take 1 mL (10 mg) by  mouth daily, Historical Med      gabapentin (NEURONTIN) 100 MG capsule Take 1 capsule (100 mg total) by mouth 2 (two) times daily as needed (Neuropathy), Starting Tue 06/01/2021, Print      levothyroxine (SYNTHROID) 25 MCG tablet Take 1 tablet (25 mcg total) by mouth every morning, Starting Wed 01/29/2020, E-Rx      lisinopril (ZESTRIL) 40 MG tablet Take 1 tablet (40 mg total) by mouth daily, Starting Fri 04/02/2021, E-Rx      meclizine (ANTIVERT) 25 MG tablet Take 1 tablet (25 mg) by mouth 3 (three) times daily as needed for Dizziness, Starting Sat 11/25/2023, E-Rx      Multiple Vitamins-Minerals (MULTIVITAMIN WITH MINERALS) tablet Take 1 tablet by mouth every morning, Historical Med      pantoprazole (PROTONIX) 40 MG tablet Take 1 tablet (40 mg) by mouth daily, Historical Med      polyethylene glycol (MIRALAX) 17 g packet Take 17 g by mouth daily, Starting Sun 10/30/2022, E-Rx      rosuvastatin (CRESTOR) 40 MG tablet Take 1 tablet (40 mg) by mouth daily, Starting Sat 09/17/2022, E-Rx                 This note was generated by the Epic EMR system/ Dragon speech recognition and may contain inherent errors or omissions not intended by the user. Grammatical errors, random word insertions, deletions and pronoun errors  are occasional consequences of this technology due to software limitations. Not all errors are caught or corrected. If there are questions or concerns about the content of this note or information contained within the body of this dictation they should be addressed directly with the author for clarification.           [1]   Past Surgical History:  Procedure Laterality Date    COLONOSCOPY, DIAGNOSTIC (SCREENING)  2021    COLONOSCOPY, REMOVAL OF TUMOR, POLYP, OR OTHER LESION BY SNARE TECHNIQUE N/A 07/21/2020    Procedure: COLONOSCOPY, POLYPECTOMY;  Surgeon: Einar Grad, MD;  Location: [facility];  Service: Gastroenterology;  Laterality: N/A;    EGD, BIOPSY N/A 07/21/2020    Procedure: EGD, BIOPSY;  Surgeon: Einar Grad, MD;  Location: [facility];  Service: Gastroenterology;  Laterality: N/A;    EXPLORATORY LAPAROTOMY N/A 05/22/2021    Procedure: EXPLORATORY LAPAROTOMY;  Surgeon: Delena Serve, MD;  Location: [facility];  Service: General;  Laterality: N/A;    HYSTERECTOMY      LAPAROSCOPY, DIAGNOSTIC N/A 05/22/2021    Procedure: LAPAROSCOPY, DIAGNOSTIC;  Surgeon: Delena Serve, MD;  Location: [facility];  Service: General;  Laterality: N/A;    LAPAROTOMY, COLECTOMY, RIGHT Right 05/22/2021    Procedure: LAPAROTOMY, COLECTOMY, RIGHT;  Surgeon: Delena Serve, MD;  Location: [facility];  Service: General;  Laterality: Right;    MASTECTOMY Right 2015   [2]   Social History  Tobacco Use    Smoking status: Never    Smokeless tobacco: Never   Vaping Use    Vaping status: Never Used   Substance Use Topics    Alcohol use: Yes     Comment: occ    Drug use: No   [3]   Family History  Problem Relation Name Age of Onset    Breast cancer Neg Hx          Lennette Bihari, FNP  12/23/23 1631

## 2023-12-24 LAB — CHLAMYDIA TRACHOMATIS, NEISSERIA GONORRHEA AND TRICHOMONAS VAGINALIS, PCR
Chlamydia trachomatis DNA: NEGATIVE
Neisseria gonorrhoeae DNA: NEGATIVE
Trichomonas vaginalis DNA: NEGATIVE

## 2023-12-25 ENCOUNTER — Telehealth

## 2024-02-04 ENCOUNTER — Emergency Department: Payer: Self-pay

## 2024-02-04 ENCOUNTER — Observation Stay: Payer: Self-pay

## 2024-02-04 ENCOUNTER — Inpatient Hospital Stay
Admission: EM | Admit: 2024-02-04 | Discharge: 2024-02-09 | DRG: 305 | Disposition: A | Discharge status: Home Health | Payer: Self-pay | Attending: Internal Medicine | Admitting: Internal Medicine

## 2024-02-04 DIAGNOSIS — E785 Hyperlipidemia, unspecified: Secondary | ICD-10-CM | POA: Diagnosis present

## 2024-02-04 DIAGNOSIS — C50919 Malignant neoplasm of unspecified site of unspecified female breast: Secondary | ICD-10-CM | POA: Diagnosis present

## 2024-02-04 DIAGNOSIS — E039 Hypothyroidism, unspecified: Secondary | ICD-10-CM | POA: Diagnosis present

## 2024-02-04 DIAGNOSIS — R079 Chest pain, unspecified: Secondary | ICD-10-CM

## 2024-02-04 DIAGNOSIS — I10 Essential (primary) hypertension: Secondary | ICD-10-CM

## 2024-02-04 DIAGNOSIS — I959 Hypotension, unspecified: Secondary | ICD-10-CM | POA: Diagnosis present

## 2024-02-04 DIAGNOSIS — Z91148 Patient's other noncompliance with medication regimen for other reason: Secondary | ICD-10-CM

## 2024-02-04 DIAGNOSIS — E782 Mixed hyperlipidemia: Secondary | ICD-10-CM

## 2024-02-04 DIAGNOSIS — D638 Anemia in other chronic diseases classified elsewhere: Secondary | ICD-10-CM | POA: Diagnosis present

## 2024-02-04 DIAGNOSIS — R202 Paresthesia of skin: Secondary | ICD-10-CM | POA: Diagnosis present

## 2024-02-04 DIAGNOSIS — Z79899 Other long term (current) drug therapy: Secondary | ICD-10-CM

## 2024-02-04 DIAGNOSIS — I161 Hypertensive emergency: Principal | ICD-10-CM | POA: Diagnosis present

## 2024-02-04 DIAGNOSIS — E119 Type 2 diabetes mellitus without complications: Secondary | ICD-10-CM | POA: Diagnosis present

## 2024-02-04 DIAGNOSIS — K219 Gastro-esophageal reflux disease without esophagitis: Secondary | ICD-10-CM | POA: Diagnosis present

## 2024-02-04 DIAGNOSIS — R791 Abnormal coagulation profile: Secondary | ICD-10-CM | POA: Diagnosis present

## 2024-02-04 DIAGNOSIS — Z7982 Long term (current) use of aspirin: Secondary | ICD-10-CM

## 2024-02-04 DIAGNOSIS — Z79811 Long term (current) use of aromatase inhibitors: Secondary | ICD-10-CM

## 2024-02-04 DIAGNOSIS — R42 Dizziness and giddiness: Secondary | ICD-10-CM | POA: Diagnosis present

## 2024-02-04 LAB — COMPREHENSIVE METABOLIC PANEL
ALT: 12 U/L (ref <=55)
AST (SGOT): 26 U/L (ref <=41)
Albumin/Globulin Ratio: 1.2 (ref 0.9–2.2)
Albumin: 3.7 g/dL (ref 3.5–5.0)
Alkaline Phosphatase: 90 U/L (ref 37–117)
Anion Gap: 7 (ref 5.0–15.0)
BUN: 16 mg/dL (ref 7–21)
Bilirubin, Total: 0.2 mg/dL (ref 0.2–1.2)
CO2: 26 meq/L (ref 17–29)
Calcium: 8.6 mg/dL (ref 7.9–10.2)
Chloride: 108 meq/L (ref 99–111)
Creatinine: 0.9 mg/dL (ref 0.4–1.0)
GFR: >60 mL/min/{1.73_m2} (ref >=60.0)
Globulin: 3 g/dL (ref 2.0–3.6)
Glucose: 149 mg/dL — ABNORMAL HIGH (ref 70–100)
Potassium: 4.1 meq/L (ref 3.5–5.3)
Protein, Total: 6.7 g/dL (ref 6.0–8.3)
Sodium: 141 meq/L (ref 135–145)

## 2024-02-04 LAB — HIGH SENSITIVITY TROPONIN-I: hs Troponin: <2.7 ng/L (ref <=14.0)

## 2024-02-04 LAB — ECG 12-LEAD
Atrial Rate: 59 {beats}/min
P Axis: 61 degrees
P-R Interval: 206 ms
Q-T Interval: 448 ms
QRS Duration: 92 ms
QTC Calculation (Bezet): 443 ms
R Axis: 63 degrees
T Axis: 52 degrees
Ventricular Rate: 59 {beats}/min

## 2024-02-04 LAB — LAB USE ONLY - CBC WITH DIFFERENTIAL
Absolute Basophils: 0.02 10*3/uL (ref 0.00–0.08)
Absolute Eosinophils: 0.09 10*3/uL (ref 0.00–0.44)
Absolute Immature Granulocytes: 0.01 10*3/uL (ref 0.00–0.07)
Absolute Lymphocytes: 2.11 10*3/uL (ref 0.42–3.22)
Absolute Monocytes: 0.33 10*3/uL (ref 0.21–0.85)
Absolute Neutrophils: 2.99 10*3/uL (ref 1.10–6.33)
Absolute nRBC: 0 10*3/uL (ref <=0.00)
Basophils %: 0.4 %
Eosinophils %: 1.6 %
Hematocrit: 36.6 % (ref 34.7–43.7)
Hemoglobin: 11.1 g/dL — ABNORMAL LOW (ref 11.4–14.8)
Immature Granulocytes %: 0.2 %
Lymphocytes %: 38 %
MCH: 26.7 pg (ref 25.1–33.5)
MCHC: 30.3 g/dL — ABNORMAL LOW (ref 31.5–35.8)
MCV: 88.2 fL (ref 78.0–96.0)
MPV: 11.7 fL (ref 8.9–12.5)
Monocytes %: 5.9 %
Neutrophils %: 53.9 %
Platelet Count: 269 10*3/uL (ref 142–346)
Preliminary Absolute Neutrophil Count: 2.99 10*3/uL (ref 1.10–6.33)
RBC: 4.15 10*6/uL (ref 3.90–5.10)
RDW: 14 % (ref 11–15)
WBC: 5.55 10*3/uL (ref 3.10–9.50)
nRBC %: 0 /100{WBCs} (ref <=0.0)

## 2024-02-04 LAB — LIPID PANEL
Cholesterol / HDL Ratio: 2.6 {index}
Cholesterol: 151 mg/dL (ref <=199)
HDL: 59 mg/dL
LDL Calculated: 77 mg/dL (ref 0–99)
Triglycerides: 75 mg/dL
VLDL Calculated: 15 mg/dL (ref 10–40)

## 2024-02-04 LAB — NT-PROBNP: NT-ProBNP: 108 pg/mL (ref <450)

## 2024-02-04 LAB — HEMOGLOBIN A1C
Average Estimated Glucose: 125.5 mg/dL
Hemoglobin A1C: 6 % — ABNORMAL HIGH (ref 4.6–5.6)

## 2024-02-04 LAB — D-DIMER: D-Dimer: 5.42 ug{FEU}/mL — ABNORMAL HIGH (ref <0.50)

## 2024-02-04 LAB — TSH: TSH: 0.66 u[IU]/mL (ref 0.35–4.94)

## 2024-02-04 MED ORDER — AMLODIPINE BESYLATE 5 MG PO TABS
10.0000 mg | ORAL_TABLET | Freq: Every day | ORAL | Status: AC
Start: 2024-02-04 — End: ?
  Administered 2024-02-04 – 2024-02-09 (×5): 10 mg via ORAL
  Filled 2024-02-04 (×6): qty 2

## 2024-02-04 MED ORDER — POTASSIUM CHLORIDE CRYS ER 20 MEQ PO TBCR
0.0000 meq | EXTENDED_RELEASE_TABLET | ORAL | Status: AC | PRN
Start: 2024-02-04 — End: ?

## 2024-02-04 MED ORDER — POTASSIUM CHLORIDE 10 MEQ/100ML IV SOLN (WRAP)
10.0000 meq | INTRAVENOUS | Status: AC | PRN
Start: 2024-02-04 — End: ?

## 2024-02-04 MED ORDER — POTASSIUM & SODIUM PHOSPHATES 280-160-250 MG PO PACK
2.0000 | PACK | ORAL | Status: AC | PRN
Start: 2024-02-04 — End: ?

## 2024-02-04 MED ORDER — LABETALOL HCL 5 MG/ML IV SOLN (WRAP)
10.0000 mg | Freq: Once | INTRAVENOUS | Status: DC
Start: 2024-02-04 — End: 2024-02-04

## 2024-02-04 MED ORDER — LISINOPRIL 20 MG PO TABS
40.0000 mg | ORAL_TABLET | Freq: Every day | ORAL | Status: AC
Start: 2024-02-04 — End: ?
  Administered 2024-02-04 – 2024-02-09 (×5): 40 mg via ORAL
  Filled 2024-02-04 (×6): qty 2

## 2024-02-04 MED ORDER — ROSUVASTATIN CALCIUM 10 MG PO TABS
40.0000 mg | ORAL_TABLET | Freq: Every day | ORAL | Status: AC
Start: 2024-02-04 — End: ?
  Administered 2024-02-04 – 2024-02-08 (×5): 40 mg via ORAL
  Filled 2024-02-04 (×5): qty 4

## 2024-02-04 MED ORDER — DEXTROSE 10 % IV BOLUS
12.5000 g | INTRAVENOUS | Status: AC | PRN
Start: 2024-02-04 — End: ?

## 2024-02-04 MED ORDER — FUROSEMIDE 10 MG/ML IJ SOLN
40.0000 mg | Freq: Once | INTRAMUSCULAR | Status: DC
Start: 2024-02-04 — End: 2024-02-07
  Filled 2024-02-04: qty 4

## 2024-02-04 MED ORDER — LISINOPRIL 20 MG PO TABS
40.0000 mg | ORAL_TABLET | Freq: Every day | ORAL | Status: DC
Start: 2024-02-05 — End: 2024-02-04

## 2024-02-04 MED ORDER — POTASSIUM CHLORIDE 20 MEQ PO PACK
0.0000 meq | PACK | ORAL | Status: AC | PRN
Start: 2024-02-04 — End: ?

## 2024-02-04 MED ORDER — AMLODIPINE BESYLATE 5 MG PO TABS
10.0000 mg | ORAL_TABLET | Freq: Every day | ORAL | Status: DC
Start: 2024-02-05 — End: 2024-02-04

## 2024-02-04 MED ORDER — LABETALOL HCL 5 MG/ML IV SOLN (WRAP)
20.0000 mg | INTRAVENOUS | Status: AC | PRN
Start: 2024-02-04 — End: ?
  Filled 2024-02-04: qty 4

## 2024-02-04 MED ORDER — NALOXONE HCL 0.4 MG/ML IJ SOLN (WRAP)
0.2000 mg | INTRAMUSCULAR | Status: AC | PRN
Start: 2024-02-04 — End: ?

## 2024-02-04 MED ORDER — LABETALOL HCL 5 MG/ML IV SOLN (WRAP)
10.0000 mg | Freq: Once | INTRAVENOUS | Status: AC
Start: 2024-02-04 — End: 2024-02-04
  Administered 2024-02-04: 10 mg via INTRAVENOUS
  Filled 2024-02-04: qty 4

## 2024-02-04 MED ORDER — ACETAMINOPHEN 325 MG PO TABS
650.0000 mg | ORAL_TABLET | ORAL | Status: AC | PRN
Start: 2024-02-04 — End: ?
  Administered 2024-02-04 – 2024-02-08 (×6): 650 mg via ORAL
  Filled 2024-02-04 (×6): qty 2

## 2024-02-04 MED ORDER — MELATONIN 3 MG PO TABS
3.0000 mg | ORAL_TABLET | Freq: Every evening | ORAL | Status: AC | PRN
Start: 2024-02-04 — End: ?
  Administered 2024-02-06: 3 mg via ORAL
  Filled 2024-02-04: qty 1

## 2024-02-04 MED ORDER — MAGNESIUM SULFATE IN D5W 1-5 GM/100ML-% IV SOLN
1.0000 g | INTRAVENOUS | Status: AC | PRN
Start: 2024-02-04 — End: ?

## 2024-02-04 MED ORDER — BENZONATATE 100 MG PO CAPS
100.0000 mg | ORAL_CAPSULE | Freq: Three times a day (TID) | ORAL | Status: AC | PRN
Start: 2024-02-04 — End: ?

## 2024-02-04 MED ORDER — SALINE SPRAY 0.65 % NA SOLN
2.0000 | NASAL | Status: AC | PRN
Start: 2024-02-04 — End: ?

## 2024-02-04 MED ORDER — ANASTROZOLE 1 MG PO TABS
1.0000 mg | ORAL_TABLET | Freq: Every morning | ORAL | Status: DC
  Administered 2024-02-04 – 2024-02-09 (×6): 1 mg via ORAL
  Filled 2024-02-04 (×6): qty 1

## 2024-02-04 MED ORDER — GLUCOSE 40 % PO GEL (WRAP)
15.0000 g | ORAL | Status: AC | PRN
Start: 2024-02-04 — End: ?

## 2024-02-04 MED ORDER — CARBOXYMETHYLCELLULOSE SOD PF 0.5 % OP SOLN
1.0000 [drp] | Freq: Three times a day (TID) | OPHTHALMIC | Status: AC | PRN
Start: 2024-02-04 — End: ?

## 2024-02-04 MED ORDER — IOHEXOL 350 MG/ML IV SOLN
100.0000 mL | Freq: Once | INTRAVENOUS | Status: AC | PRN
Start: 2024-02-04 — End: 2024-02-04
  Administered 2024-02-04: 100 mL via INTRAVENOUS
  Filled 2024-02-04: qty 100

## 2024-02-04 MED ORDER — GLUCAGON 1 MG IJ SOLR (WRAP)
1.0000 mg | INTRAMUSCULAR | Status: AC | PRN
Start: 2024-02-04 — End: ?

## 2024-02-04 MED ORDER — PANTOPRAZOLE SODIUM 40 MG PO TBEC
40.0000 mg | DELAYED_RELEASE_TABLET | Freq: Every morning | ORAL | Status: AC
Start: 2024-02-04 — End: ?
  Administered 2024-02-04 – 2024-02-09 (×6): 40 mg via ORAL
  Filled 2024-02-04 (×6): qty 1

## 2024-02-04 MED ORDER — MECLIZINE HCL 12.5 MG PO TABS
12.5000 mg | ORAL_TABLET | Freq: Three times a day (TID) | ORAL | Status: AC | PRN
Start: 2024-02-04 — End: ?

## 2024-02-04 MED ORDER — DEXTROSE 50 % IV SOLN
12.5000 g | INTRAVENOUS | Status: AC | PRN
Start: 2024-02-04 — End: ?

## 2024-02-04 MED ORDER — HYDRALAZINE HCL 20 MG/ML IJ SOLN
10.0000 mg | INTRAMUSCULAR | Status: AC | PRN
Start: 2024-02-04 — End: ?
  Administered 2024-02-04: 10 mg via INTRAVENOUS
  Filled 2024-02-04: qty 1

## 2024-02-04 MED ORDER — BENZOCAINE-MENTHOL MT LOZG (WRAP)
1.0000 | LOZENGE | OROMUCOSAL | Status: AC | PRN
Start: 2024-02-04 — End: ?

## 2024-02-04 MED ORDER — ENOXAPARIN SODIUM 40 MG/0.4ML IJ SOSY
40.0000 mg | PREFILLED_SYRINGE | Freq: Every day | INTRAMUSCULAR | Status: AC
Start: 2024-02-04 — End: ?
  Administered 2024-02-04 – 2024-02-09 (×6): 40 mg via SUBCUTANEOUS
  Filled 2024-02-04 (×6): qty 0.4

## 2024-02-04 MED ORDER — ONDANSETRON HCL 4 MG/2ML IJ SOLN
4.0000 mg | Freq: Three times a day (TID) | INTRAMUSCULAR | Status: AC | PRN
Start: 2024-02-04 — End: ?
  Administered 2024-02-06: 4 mg via INTRAVENOUS
  Filled 2024-02-04: qty 2

## 2024-02-04 MED ORDER — ASPIRIN 81 MG PO CHEW
81.0000 mg | CHEWABLE_TABLET | Freq: Every day | ORAL | Status: DC
Start: 2024-02-04 — End: 2024-02-06
  Administered 2024-02-04 – 2024-02-06 (×3): 81 mg via ORAL
  Filled 2024-02-04 (×3): qty 1

## 2024-02-04 NOTE — ED Notes (Addendum)
 Pt asking to go to bathroom, disconnected from all monitoring cables, Pt almost fell after 2 steps out of bed, RN provided education on getting out of bed at this time is unsafe, offered Bedpan, Pt attempted but cannot toilet with bedpan, RN provided with instructions on calling for bedpan with call bell, Pt consented to RN applying purwick to Pt, Pt repeatedly asked RN to escort Pt to bathroom, this RN provided reeducation that going to bathroom right now was unsafe. Informed MD

## 2024-02-04 NOTE — Nursing Progress Note (Signed)
 4 eyes in 4 hours pressure injury assessment note:      Completed with: [place] RN   Unit & Time admitted:  B 633-2. 02/04/24, 1830 PM            Bony Prominences: Check appropriate box; if wound is present enter wound assessment in LDA     Occiput:                 [x] WNL  []  Wound present  Face:                     [x] WNL  []  Wound present  Ears:                      [x] WNL  []  Wound present  Spine:                    [x] WNL  []  Wound present  Shoulders:             [x] WNL  []  Wound present  Elbows:                  [x] WNL  []  Wound present  Sacrum/coccyx:     [x] WNL  []  Wound present  Ischial Tuberosity:  [x] WNL  []  Wound present  Trochanter/Hip:      [x] WNL  []  Wound present  Knees:                   [x] WNL  []  Wound present  Ankles:                   [x] WNL  []  Wound present  Heels:                    [x] WNL  []  Wound present  Other pressure areas:  []  Wound location       Device related: []  Device name:         LDA completed if wound present: yes/no  Consult WOCN if necessary    Other skin related issues, ie tears, rash, etc, document in Integumentary flowsheet

## 2024-02-04 NOTE — ED to IP RN Note (Signed)
 [facility]  ED NURSING NOTE FOR THE RECEIVING INPATIENT NURSE   ED NURSE Verlee Glen [phone]   ED CHARGE RN Adrain Alar   ADMISSION INFORMATION   Sanam Dwiggins Oconnell is a 78 y.o. female admitted with an ED diagnosis of:    1. Hypertensive emergency         Isolation: None   Allergies: Morphine  and Morphine  and codeine    Holding Orders confirmed? Yes   Belongings Documented? Yes   Home medications sent to pharmacy confirmed? N/A   NURSING CARE   Patient Comes From:   Mental Status: Home Independent (apartment)  alert and oriented (poor historian. A&Ox4)   ADL: Independent with all ADLs   Ambulation: High fall risk (see nurses notes)   Pertinent Information  and Safety Concerns:     Broset Violence Risk Level: Low MRI screening form completed. Please call nurse with any questions or concerns.      CT / NIH   CT Head ordered on this patient?  Yes   NIH/Dysphagia assessment done prior to admission? Yes   VITAL SIGNS (at the time of this note)      Vitals:    02/04/24 1323   BP: 177/79   Pulse: 65   Resp: 17   Temp: 98 F (36.7 C)   SpO2: 96%     Pain Score: 8-severe pain (02/04/24 1323)

## 2024-02-04 NOTE — H&P (Addendum)
 ADMISSION HISTORY AND PHYSICAL EXAM    [facility], DIVISION OF HOSPITALIST MEDICINE         Date Time: 02/04/24 12:48 PM  Patient Name: Emily [name]  Attending Physician: Maryelizabeth Smolder, MD  Primary Care Physician: Obadiah Bellow, MD    CC: Dizziness/vomiting      There are no active hospital problems to display for this patient.      Patient has a BMI of 44.87 kg/m2    Class 3 Obesity: BMI of 40 or greater, or BMI of 35 or greater with Hypertension or Diabetes Mellitus     Recent Labs   Lab 02/04/24  1103   Hemoglobin 11.1*   Hematocrit 36.6   MCV 88.2   WBC 5.55   Platelet Count 269         Anemia Diagnosis: Chronic: Anemia of Chronic Disease     Assessment and Plan :   Emily [name] is a 78 y.o. female with Past medical history of breast cancer, hypertension, hyperlipidemia, GERD, came in today with dizziness/vomiting, found to be hypertensive to 220/90, admitted with hypertensive emergency    #Hypertensive emergency  Initially hypertensive to 220/90, improved with labetalol   D-dimer elevated, CT chest negative for PE showing cardiomegaly and mild atelectasis  Trop/BNP normal  Given IV Lasix  40 mg once in ED  Will reduce BP by 25% in 24 hours - goal SBP around 180 until tomorrow   Still feeling dizzy and nauseous, will add meclizine /Zofran   Cardiology consulted, unlikely hyperaldosterone/pheochromocytoma/renal artery stenosis - appreciate recs  Takes lisinopril  and amlodipine  at home, starting from tomorrow    #Right arm numbness and tingling  CT head negative, check MRI brain, consider neurology consult depending on findings, could be related to her chemo therapy  Check lipid panel, TSH, A1c    #History of breast cancer, continue previous meds    #Hyperlipidemia, continue statin    #GERD, continue Protonix         Code status: Full code         History of Presenting Illnes   Emily [name] is a 78 y.o. female presenting with nausea and vomiting, began last night, reports  not take her blood pressure medications this morning because of the nausea and vomiting and dizziness she has been experiencing.  She is not able to sit up because of all the dizziness.  She reports she has not seen her cardiologist this year, no changes in her medications since the last time she was hospitalized.  No chest pain.  Also reports right arm numbness and tingling since last night.    Past Medical Histor     Past Medical History:   Diagnosis Date    Breast cancer (CMS/HCC) 2014    right breast mastectomy    Exercise-induced angina     negative work up    GERD (gastroesophageal reflux disease)     Headache     resolved    Hyperlipidemia     Hypertension     Hypothyroidism     Low back pain     Malignant neoplasm of overlapping sites of right female breast (CMS/HCC) 10/19/2015    Syncope and collapse     Chronic Vertigo    Vertigo          Available old records reviewed, including: EPIC     Past Surgical History:   Past Surgical History[1]    Family History:   Family History[2]    Social History:  reports that she has never smoked. She has never used smokeless tobacco. She reports current alcohol use. She reports that she does not use drugs.    Allergies:   Allergies[3]    Medications:     Home Medications               amLODIPine  (NORVASC ) 10 MG tablet     Take 1 tablet (10 mg total) by mouth daily     anastrozole  (ARIMIDEX ) 1 MG tablet     Take 1 tablet (1 mg total) by mouth every morning     aspirin  81 MG chewable tablet     Chew 1 tablet (81 mg total) by mouth daily     celecoxib  (CeleBREX ) 200 MG capsule     Take 1 capsule (200 mg) by mouth daily     diclofenac  Sodium (VOLTAREN ) 1 % Gel topical gel     Apply 2 g topically 4 (four) times daily     ezetimibe  (ZETIA ) 10 MG tablet     Take 1 tablet (10 mg) by mouth daily     Ferrous Sulfate (IRON PO)     Take by mouth     furosemide  (LASIX ) 10 MG/ML solution     Take 1 mL (10 mg) by mouth daily     gabapentin  (NEURONTIN ) 100 MG capsule     Take 1 capsule  (100 mg total) by mouth 2 (two) times daily as needed (Neuropathy)     levothyroxine  (SYNTHROID ) 25 MCG tablet     Take 1 tablet (25 mcg total) by mouth every morning     lidocaine  (LIDODERM ) 5 %     Place 1 patch onto the skin in the morning. Remove & Discard patch within 12 hours or as directed by MD.     lisinopril  (ZESTRIL ) 40 MG tablet     Take 1 tablet (40 mg total) by mouth daily     meclizine  (ANTIVERT ) 25 MG tablet     Take 1 tablet (25 mg) by mouth 3 (three) times daily as needed for Dizziness     methocarbamol  (ROBAXIN ) 750 MG tablet     Take 1 tablet (750 mg) by mouth 3 (three) times daily as needed (for spasm)     Multiple Vitamins-Minerals (MULTIVITAMIN WITH MINERALS) tablet     Take 1 tablet by mouth every morning     pantoprazole  (PROTONIX ) 40 MG tablet     Take 1 tablet (40 mg) by mouth daily     polyethylene glycol (MIRALAX ) 17 g packet     Take 17 g by mouth daily     rosuvastatin  (CRESTOR ) 40 MG tablet     Take 1 tablet (40 mg) by mouth daily                  Review of Systems:   All other systems were reviewed and are negative except:as above in HPI     Physical Exam:   Patient Vitals for the past 24 hrs:   BP Temp Temp src Pulse Resp SpO2 Height Weight   02/04/24 1130 174/76 -- -- (!) 53 15 90 % -- --   02/04/24 1048 (!) 213/90 98 F (36.7 C) Oral 78 20 92 % 1.295 m (4\' 3" ) 75.3 kg (166 lb 0.1 oz)     Body mass index is 44.87 kg/m.  No intake or output data in the 24 hours ending 02/04/24 1248    General: WD female in no acute distress.  Cardiovascular: Normal rate and rhythm  Lungs: clear to auscultation bilaterally, without wheezing, rhonchi, or rales  Abdomen: non-tender, non-distended  Extremities: no edema , pulses palpable   Neuro: alert, cranial nerves grossly intact      Labs:     Results for orders placed or performed during the hospital encounter of 02/04/24 (from the past 24 hours)   Comprehensive Metabolic Panel    Collection Time: 02/04/24 11:03 AM   Result Value    Glucose 149  (H)    BUN 16    Creatinine 0.9    Sodium 141    Potassium 4.1    Chloride 108    CO2 26    Calcium  8.6    Anion Gap 7.0    GFR >60.0    AST (SGOT) 26    ALT 12    Alkaline Phosphatase 90    Albumin  3.7    Protein, Total 6.7    Globulin 3.0    Albumin /Globulin Ratio 1.2    Bilirubin, Total 0.2   High Sensitivity Troponin-I    Collection Time: 02/04/24 11:03 AM   Result Value    hs Troponin <2.7   CBC with Differential (Component)    Collection Time: 02/04/24 11:03 AM   Result Value    WBC 5.55    Hemoglobin 11.1 (L)    Hematocrit 36.6    Platelet Count 269    MPV 11.7    RBC 4.15    MCV 88.2    MCH 26.7    MCHC 30.3 (L)    RDW 14    nRBC % 0.0    Absolute nRBC 0.00    Preliminary Absolute Neutrophil Count 2.99    Neutrophils % 53.9    Lymphocytes % 38.0    Monocytes % 5.9    Eosinophils % 1.6    Basophils % 0.4    Immature Granulocytes % 0.2    Absolute Neutrophils 2.99    Absolute Lymphocytes 2.11    Absolute Monocytes 0.33    Absolute Eosinophils 0.09    Absolute Basophils 0.02    Absolute Immature Granulocytes 0.01    Collection Time: 02/04/24 11:03 AM   Result Value    NT-ProBNP 108    Collection Time: 02/04/24 11:24 AM   Result Value    D-Dimer 5.42 (H)           Imaging personally reviewed, including: all available   CT Angio Chest (PE study)  Result Date: 02/04/2024  1.No pulmonary embolism. 2.Cardiomegaly. 3.Mild atelectasis. Marc Senior, MD 02/04/2024 12:17 PM    XR Chest  AP Portable  Result Date: 02/04/2024  Moderate pulmonary edema. Regino Caprio, MD 02/04/2024 11:37 AM          This note was generated by the Epic EMR system/ Dragon speech recognition and may contain inherent errors or omissions not intended by the user. Grammatical errors, random word insertions, deletions and pronoun errors  are occasional consequences of this technology due to software limitations. Not all errors are caught or corrected. If there are questions or concerns about the content of this note or information contained within the body  of this dictation they should be addressed directly with the author for clarification.    Signed by: Bradd Cabot, DO  [email or web address], Davey Erp, MD         [1]   Past Surgical History:  Procedure Laterality Date    COLONOSCOPY, DIAGNOSTIC (SCREENING)  2021    COLONOSCOPY, REMOVAL  OF TUMOR, POLYP, OR OTHER LESION BY SNARE TECHNIQUE N/A 07/21/2020    Procedure: COLONOSCOPY, POLYPECTOMY;  Surgeon: Ebbie Goldmann, MD;  Location: [facility];  Service: Gastroenterology;  Laterality: N/A;    EGD, BIOPSY N/A 07/21/2020    Procedure: EGD, BIOPSY;  Surgeon: Ebbie Goldmann, MD;  Location: [facility];  Service: Gastroenterology;  Laterality: N/A;    EXPLORATORY LAPAROTOMY N/A 05/22/2021    Procedure: EXPLORATORY LAPAROTOMY;  Surgeon: Levie Ream, MD;  Location: [facility];  Service: General;  Laterality: N/A;    HYSTERECTOMY      LAPAROSCOPY, DIAGNOSTIC N/A 05/22/2021    Procedure: LAPAROSCOPY, DIAGNOSTIC;  Surgeon: Levie Ream, MD;  Location: [facility];  Service: General;  Laterality: N/A;    LAPAROTOMY, COLECTOMY, RIGHT Right 05/22/2021    Procedure: LAPAROTOMY, COLECTOMY, RIGHT;  Surgeon: Levie Ream, MD;  Location: [facility];  Service: General;  Laterality: Right;    MASTECTOMY Right 2015   [2]   Family History  Problem Relation Name Age of Onset    Breast cancer Neg Hx     [3]   Allergies  Allergen Reactions    Morphine  Itching    Morphine  And Codeine  Itching and Nausea And Vomiting     dizzy

## 2024-02-04 NOTE — ED Provider Notes (Signed)
 EMERGENCY DEPARTMENT NOTE     Patient initially seen and examined at   ED PHYSICIAN ASSIGNED       Date/Time Event User Comments    02/04/24 1102 Physician Assigned Emmie Harness, MD assigned as Attending           ED MIDLEVEL (APP) ASSIGNED       None            HISTORY OF PRESENT ILLNESS   Independent Historian:No  Translator Used: mvtranslator: no    Chief Complaint: Nausea, Dizziness, and Hypertension       78 y.o. female who presents today for evaluation of dizziness.  Feels like she is going fall over when she walks.  Associated with vomiting.  Started this morning.  States she felt normal when she went to bed last night.  Denies any chest pain shortness of breath leg pain or swelling swelling.  Does note she has a history of breast cancer.  She states that she is unsure of if the cancer is in remission or not.  Has a history of hypertension, did not take her blood pressure medications this morning.  No overt headache.    MEDICAL HISTORY     Past Medical History:  Past Medical History:   Diagnosis Date    Breast cancer (CMS/HCC) 2014    right breast mastectomy    Exercise-induced angina     negative work up    GERD (gastroesophageal reflux disease)     Headache     resolved    Hyperlipidemia     Hypertension     Hypothyroidism     Low back pain     Malignant neoplasm of overlapping sites of right female breast (CMS/HCC) 10/19/2015    Syncope and collapse     Chronic Vertigo    Vertigo        Past Surgical History:  Past Surgical History[1]    Social History:  Social History[2]    Family History:  Family History[3]    Outpatient Medication:  Previous Medications    AMLODIPINE  (NORVASC ) 10 MG TABLET    Take 1 tablet (10 mg total) by mouth daily    ANASTROZOLE  (ARIMIDEX ) 1 MG TABLET    Take 1 tablet (1 mg total) by mouth every morning    ASPIRIN  81 MG CHEWABLE TABLET    Chew 1 tablet (81 mg total) by mouth daily    CELECOXIB  (CELEBREX ) 200 MG CAPSULE    Take 1 capsule (200 mg) by mouth  daily    DICLOFENAC  SODIUM (VOLTAREN ) 1 % GEL TOPICAL GEL    Apply 2 g topically 4 (four) times daily    EZETIMIBE  (ZETIA ) 10 MG TABLET    Take 1 tablet (10 mg) by mouth daily    FERROUS SULFATE (IRON PO)    Take by mouth    FUROSEMIDE  (LASIX ) 10 MG/ML SOLUTION    Take 1 mL (10 mg) by mouth daily    GABAPENTIN  (NEURONTIN ) 100 MG CAPSULE    Take 1 capsule (100 mg total) by mouth 2 (two) times daily as needed (Neuropathy)    LEVOTHYROXINE  (SYNTHROID ) 25 MCG TABLET    Take 1 tablet (25 mcg total) by mouth every morning    LIDOCAINE  (LIDODERM ) 5 %    Place 1 patch onto the skin in the morning. Remove & Discard patch within 12 hours or as directed by MD.    LISINOPRIL  (ZESTRIL ) 40 MG TABLET    Take 1  tablet (40 mg total) by mouth daily    MECLIZINE  (ANTIVERT ) 25 MG TABLET    Take 1 tablet (25 mg) by mouth 3 (three) times daily as needed for Dizziness    METHOCARBAMOL  (ROBAXIN ) 750 MG TABLET    Take 1 tablet (750 mg) by mouth 3 (three) times daily as needed (for spasm)    MULTIPLE VITAMINS-MINERALS (MULTIVITAMIN WITH MINERALS) TABLET    Take 1 tablet by mouth every morning    PANTOPRAZOLE  (PROTONIX ) 40 MG TABLET    Take 1 tablet (40 mg) by mouth daily    POLYETHYLENE GLYCOL (MIRALAX ) 17 G PACKET    Take 17 g by mouth daily    ROSUVASTATIN  (CRESTOR ) 40 MG TABLET    Take 1 tablet (40 mg) by mouth daily         REVIEW OF SYSTEMS   Review of Systems   Constitutional:  Negative for chills and fever.   HENT:  Negative for sore throat.    Eyes:  Negative for visual disturbance.   Respiratory:  Negative for cough and shortness of breath.    Cardiovascular:  Negative for chest pain.   Gastrointestinal:  Positive for vomiting. Negative for diarrhea and nausea.   Genitourinary:  Negative for dysuria, flank pain, hematuria and vaginal discharge.   Musculoskeletal:  Negative for arthralgias and myalgias.   Skin:  Negative for rash.   Allergic/Immunologic: Negative for immunocompromised state.   Neurological:  Positive for dizziness.    Psychiatric/Behavioral:  Negative for suicidal ideas.     See History of Present Illness  PHYSICAL EXAM     ED Triage Vitals [02/04/24 1048]   Encounter Vitals Group      BP (!) 213/90      Systolic BP Percentile       Diastolic BP Percentile       Heart Rate 78      Resp Rate 20      Temp 98 F (36.7 C)      Temp src Oral      SpO2 92 %      Weight 75.3 kg      Height 1.295 m      Head Circumference       Peak Flow       Pain Score 0      Pain Loc       Pain Education       Exclude from Growth Chart        Physical Exam  Vitals and nursing note reviewed.   Constitutional:       Appearance: Normal appearance.   HENT:      Head: Normocephalic and atraumatic.      Mouth/Throat:      Mouth: Mucous membranes are moist.   Eyes:      Conjunctiva/sclera: Conjunctivae normal.   Cardiovascular:      Rate and Rhythm: Normal rate and regular rhythm.   Pulmonary:      Breath sounds: Normal breath sounds.   Abdominal:      Palpations: Abdomen is soft.   Musculoskeletal:         General: No tenderness or deformity.   Skin:     General: Skin is warm and dry.   Neurological:      General: No focal deficit present.      Mental Status: She is alert and oriented to person, place, and time.      Cranial Nerves: No cranial nerve deficit.  Sensory: No sensory deficit.      Motor: No weakness.      Coordination: Coordination normal.   Psychiatric:         Mood and Affect: Mood normal.           MEDICAL DECISION MAKING     PRIMARY PROBLEM LIST     Acute illness/injury Moderate CHF DIAGNOSIS  Chronic Illness Impacting Care of the above problem: Hypertension Increases the risk of severe disease  Differential Diagnosis: Dizziness:  central vertigo, peripheral vertigo, dehydration, anemia, electrolyte abnormality, endocrine abnormality  DISCUSSION      Medical Decision Making  78 year old female presents today for evaluation of dizziness.  Concern for hypertensive emergency or intracranial process such as intracranial hemorrhage.  Of  note patient hypoxic satting 90 to 92% on room air while sitting upright in a week.  Given history of cancer this raises concerns of PE.  D-dimer significantly elevated.  Will obtain CT angiogram of the chest.    CT chest negative but workup notable for heart failure.  Suspect longstanding CHF although no formal diagnosis in her chart.  I reviewed her prior records, she had an echo done in 2021 that showed thickened LV with normal EF.  She has had multiple echoes ordered since then but has not completed any of them.  Will admit to hospitalist, consult cardiology.  Accepted by Dr. Biagio Bucy    Amount and/or Complexity of Data Reviewed  Labs: ordered.  Radiology: ordered.  ECG/medicine tests: ordered.    Risk  Prescription drug management.  Decision regarding hospitalization.        I do not suspect severe sepsis or septic shock    ED Course as of 02/04/24 1535   Sun Feb 04, 2024   1201 Chest x-ray independently interpreted, bilateral infiltrates concerning for pulmonary edema [KC]      ED Course User Index  [KC] Maryelizabeth Smolder, MD     NIH Stroke Score      Flowsheet Row Most Recent Value   NIH Stroke Scale    Interval Baseline admission to ED   1a. Level of Consciousness 0   1b. LOC Questions (age, month) 0   1c. LOC Commands (Open and close eyes, Grip AND release good hand) 0   2. Best Gaze 0   3. Visual Fields 0   4. Facial Palsy 0   5a. Motor Left Arm: (Arms with palm down X 10 seconds. Sitting = arms at 90 degrees. Supine = arms 45 degrees) 0   5b. Motor Right Arm: (Arms with palm down X 10 seconds. Sitting = arms at 90 degrees Supine = arms 45 degrees) 0   Total Motor Arms 0   6a. Motor Left Leg: (Leg elevated X 5 seconds Supine = Leg 30 degrees) 0   6b. Motor Right Leg: (Leg elevated X 5 seconds Supine = Leg 30 degrees) 0   Total Motor Legs 0   7. Limb Ataxia (Finger to nose, heel to shin) 0   8. Sensory (Sensation or grimace to pin prick on face, arm, trunk, and leg) 0   9. Best language (Describe picture,  name items, read sentences from NIHSS booklet) 0   10. Dysarthria (Read list from NIHSS booklet) 0   11. Extinction and Inattention (formerly Neglect) - Test tactile and visual stimulation 0   NIHSS Total 0            Vital Signs: Reviewed the patient's vital signs.   Nursing  Notes: Reviewed and utilized available nursing notes.  Medical Records Reviewed: Reviewed available past medical records.  Counseling: The emergency provider has spoken with the patient and discussed today's findings, in addition to providing specific details for the plan of care.  Questions are answered and there is agreement with the plan.      CARDIAC STUDIES    The following cardiac studies were independently interpreted by me the Emergency Medicine Provider.  For full cardiac study results please see chart.    Monitor Strip  Interpreted by ED Provider  Rate: 60  Rhythm: NSR   ST Changes: none    EKG Interpretation:  Signed and interpreted by ED Provider   EKG independently interpreted.  Timestamp 1222.  Normal sinus rhythm, rate of 59, normal axis.  No signs of acute ischemia.        RADIOLOGY IMAGING STUDIES      MRI Brain WO Contrast   Final Result          1.No acute intracranial abnormality. Specifically, no acute infarct.      2.Mild chronic small vessel ischemic disease and generalized cerebral   volume loss.      Milan Tony Frederickson, MD   02/04/2024 2:59 PM      CT Angio Chest (PE study)   Final Result      1.No pulmonary embolism.   2.Cardiomegaly.   3.Mild atelectasis.      Marc Senior, MD   02/04/2024 12:17 PM      CT Head WO Contrast   Final Result       1.Stable partially empty sella.   2.No intracranial hemorrhage or mass is seen.   3.There is been no change since the prior study of December 23, 2023.      Mervin Abu, MD   02/04/2024 12:46 PM      XR Chest  AP Portable   Final Result      Moderate pulmonary edema.      Nitin Kumar, MD   02/04/2024 11:37 AM          EMERGENCY DEPT. MEDICATIONS      ED Medication Orders (From  admission, onward)      Start Ordered     Status Ordering Provider    02/05/24 0900 02/04/24 1324  lisinopril  (ZESTRIL ) tablet 40 mg  Daily        Route: Oral  Ordered Dose: 40 mg       Acknowledged SHARIF, MUHAMMAD H    02/05/24 0900 02/04/24 1324  amLODIPine  (NORVASC ) tablet 10 mg  Daily        Route: Oral  Ordered Dose: 10 mg       Acknowledged SHARIF, MUHAMMAD H    02/04/24 2100 02/04/24 1324  rosuvastatin  (CRESTOR ) tablet 40 mg  Daily        Route: Oral  Ordered Dose: 40 mg       Acknowledged SHARIF, MUHAMMAD H    02/04/24 1415 02/04/24 1323  enoxaparin  (LOVENOX ) syringe 40 mg  Daily        Route: Subcutaneous  Ordered Dose: 40 mg       Last MAR action: Given SHARIF, MUHAMMAD H    02/04/24 1415 02/04/24 1324  anastrozole  (ARIMIDEX ) tablet 1 mg  Every morning        Route: Oral  Ordered Dose: 1 mg       Last MAR action: Given SHARIF, MUHAMMAD H    02/04/24 1415 02/04/24 1324  pantoprazole  (PROTONIX )  EC tablet 40 mg  Every morning before breakfast        Route: Oral  Ordered Dose: 40 mg       Last MAR action: Given SHARIF, MUHAMMAD H    02/04/24 1400 02/04/24 1324  aspirin  chewable tablet 81 mg  Daily        Route: Oral  Ordered Dose: 81 mg       Last MAR action: Given SHARIF, MUHAMMAD H    02/04/24 1322 02/04/24 1323  dextrose  (GLUCOSE) 40 % oral gel 15 g of glucose  As needed        Route: Oral  Ordered Dose: 15 g of glucose      Placed in "Or" Linked Group    Acknowledged SHARIF, MUHAMMAD H    02/04/24 1322 02/04/24 1323  dextrose  (D10W) 10% bolus 125 mL  As needed        Route: Intravenous  Ordered Dose: 12.5 g      Placed in "Or" Linked Group    Acknowledged Bradd Cabot    02/04/24 1322 02/04/24 1323  dextrose  50 % bolus 12.5 g  As needed        Route: Intravenous  Ordered Dose: 12.5 g      Placed in "Or" Linked Group    Acknowledged SHARIF, MUHAMMAD H    02/04/24 1322 02/04/24 1323  glucagon  (rDNA) (GLUCAGEN) injection 1 mg  As needed        Route: Intramuscular  Ordered Dose: 1 mg      Placed in  "Or" Linked Group    Acknowledged SHARIF, MUHAMMAD H    02/04/24 1322 02/04/24 1323  acetaminophen  (TYLENOL ) tablet 650 mg  Every 4 hours PRN        Route: Oral  Ordered Dose: 650 mg       Acknowledged SHARIF, MUHAMMAD H    02/04/24 1322 02/04/24 1323  melatonin tablet 3 mg  At bedtime PRN        Route: Oral  Ordered Dose: 3 mg       Acknowledged SHARIF, MUHAMMAD H    02/04/24 1322 02/04/24 1323  saline (OCEAN NASAL SPRAY) 0.65 % nasal solution 2 spray  Every 4 hours PRN        Route: Each Nare  Ordered Dose: 2 spray       Acknowledged SHARIF, MUHAMMAD H    02/04/24 1322 02/04/24 1323  carboxymethylcellulose (PF) (REFRESH PLUS) 0.5 % ophthalmic solution 1 drop  3 times daily PRN        Route: Both Eyes  Ordered Dose: 1 drop       Acknowledged SHARIF, MUHAMMAD H    02/04/24 1322 02/04/24 1323  benzocaine -menthol  (CEPACOL/CHLORASEPTIC) lozenge 1 lozenge  Every 2 hours PRN        Route: Buccal  Ordered Dose: 1 lozenge       Acknowledged SHARIF, MUHAMMAD H    02/04/24 1322 02/04/24 1323  benzonatate  (TESSALON ) capsule 100 mg  3 times daily PRN        Route: Oral  Ordered Dose: 100 mg       Acknowledged SHARIF, MUHAMMAD H    02/04/24 1322 02/04/24 1323  magnesium  sulfate 1g in dextrose  5% IVPB (premix)  As needed        Route: Intravenous  Ordered Dose: 1 g       Acknowledged SHARIF, MUHAMMAD H    02/04/24 1322 02/04/24 1323  potassium chloride  (KLOR-CON  M20) CR tablet 0-60 mEq  As needed        Route: Oral  Ordered Dose: 0-60 mEq      Placed in "Or" Linked Group    Acknowledged Perla Bradford H    02/04/24 1322 02/04/24 1323  potassium chloride  (KLOR-CON ) packet 0-60 mEq  As needed        Route: Oral  Ordered Dose: 0-60 mEq      Placed in "Or" Linked Group    Acknowledged Bradd Cabot    02/04/24 1322 02/04/24 1323  potassium chloride  10 mEq in 100 mL IVPB (premix)  As needed        Route: Intravenous  Ordered Dose: 10 mEq      Placed in "Or" Linked Group    Acknowledged SHARIF, MUHAMMAD H    02/04/24 1322  02/04/24 1323  potassium & sodium phosphates  (PHOS-NAK) 280-160-250 MG packet 2 packet  As needed        Route: Oral  Ordered Dose: 2 packet       Acknowledged SHARIF, MUHAMMAD H    02/04/24 1322 02/04/24 1323  naloxone  (NARCAN ) injection 0.2 mg  As needed        Route: Intravenous  Ordered Dose: 0.2 mg       Acknowledged SHARIF, MUHAMMAD H    02/04/24 1309 02/04/24 1309  meclizine  (ANTIVERT ) tablet 12.5 mg  3 times daily PRN        Route: Oral  Ordered Dose: 12.5 mg       Acknowledged SHARIF, MUHAMMAD H    02/04/24 1307 02/04/24 1307  ondansetron  (ZOFRAN ) injection 4 mg  Every 8 hours PRN        Route: Intravenous  Ordered Dose: 4 mg       Acknowledged SHARIF, MUHAMMAD H    02/04/24 1257 02/04/24 1258  labetalol  (NORMODYNE ,TRANDATE ) injection 20 mg  Every 3 hours PRN        Route: Intravenous  Ordered Dose: 20 mg       Acknowledged SHARIF, MUHAMMAD H    02/04/24 1223 02/04/24 1222  furosemide  (LASIX ) injection 40 mg  Once        Route: Intravenous  Ordered Dose: 40 mg       Last MAR action: Not Given [name] A    02/04/24 1204 02/04/24 1204  iohexol  (OMNIPAQUE ) 350 MG/ML injection 100 mL  IMG once as needed        Route: Intravenous  Ordered Dose: 100 mL       Last MAR action: Imaging Agent Given Elzie Sheets A    02/04/24 1117 02/04/24 1116    Once        Route: Intravenous  Ordered Dose: 10 mg       Discontinued Yumna Ebers A    02/04/24 1117 02/04/24 1116  labetalol  (NORMODYNE ,TRANDATE ) injection 10 mg  Once        Route: Intravenous  Ordered Dose: 10 mg       Last MAR action: Given Tauni Sanks A            LABORATORY RESULTS    Ordered and independently interpreted AVAILABLE laboratory tests.   Results for orders placed or performed during the hospital encounter of 02/04/24 (from the past 24 hours)   Comprehensive Metabolic Panel    Collection Time: 02/04/24 11:03 AM   Result Value    Glucose 149 (H)    BUN 16    Creatinine 0.9    Sodium 141    Potassium 4.1  Chloride 108    CO2  26    Calcium  8.6    Anion Gap 7.0    GFR >60.0    AST (SGOT) 26    ALT 12    Alkaline Phosphatase 90    Albumin  3.7    Protein, Total 6.7    Globulin 3.0    Albumin /Globulin Ratio 1.2    Bilirubin, Total 0.2   High Sensitivity Troponin-I    Collection Time: 02/04/24 11:03 AM   Result Value    hs Troponin <2.7   CBC with Differential (Component)    Collection Time: 02/04/24 11:03 AM   Result Value    WBC 5.55    Hemoglobin 11.1 (L)    Hematocrit 36.6    Platelet Count 269    MPV 11.7    RBC 4.15    MCV 88.2    MCH 26.7    MCHC 30.3 (L)    RDW 14    nRBC % 0.0    Absolute nRBC 0.00    Preliminary Absolute Neutrophil Count 2.99    Neutrophils % 53.9    Lymphocytes % 38.0    Monocytes % 5.9    Eosinophils % 1.6    Basophils % 0.4    Immature Granulocytes % 0.2    Absolute Neutrophils 2.99    Absolute Lymphocytes 2.11    Absolute Monocytes 0.33    Absolute Eosinophils 0.09    Absolute Basophils 0.02    Absolute Immature Granulocytes 0.01    Collection Time: 02/04/24 11:03 AM   Result Value    NT-ProBNP 108    Collection Time: 02/04/24 11:24 AM   Result Value    D-Dimer 5.42 (H)         CRITICAL CARE/PROCEDURES    Procedures    DIAGNOSIS      Diagnosis:  Final diagnoses:   Hypertensive emergency       Disposition:  ED Disposition       ED Disposition   Observation    Condition   --    Date/Time   Sun Feb 04, 2024 12:55 PM    Comment   Admitting Physician: Bradd Cabot [[id number]]   Service:: Medicine [[id number]]   Estimated Length of Stay: < 2 midnights   Tentative Discharge Plan?: Home or Self Care [1]   Does patient need telemetry?: Yes   Is patient 18 yrs or greater?: Yes   Telemetry type (separate Telemetry order is also required):: Adult telemetry                 Prescriptions:  Patient's Medications   New Prescriptions    No medications on file   Previous Medications    AMLODIPINE  (NORVASC ) 10 MG TABLET    Take 1 tablet (10 mg total) by mouth daily    ANASTROZOLE  (ARIMIDEX ) 1 MG TABLET    Take 1 tablet (1 mg total) by  mouth every morning    ASPIRIN  81 MG CHEWABLE TABLET    Chew 1 tablet (81 mg total) by mouth daily    CELECOXIB  (CELEBREX ) 200 MG CAPSULE    Take 1 capsule (200 mg) by mouth daily    DICLOFENAC  SODIUM (VOLTAREN ) 1 % GEL TOPICAL GEL    Apply 2 g topically 4 (four) times daily    EZETIMIBE  (ZETIA ) 10 MG TABLET    Take 1 tablet (10 mg) by mouth daily    FERROUS SULFATE (IRON PO)    Take by mouth  FUROSEMIDE  (LASIX ) 10 MG/ML SOLUTION    Take 1 mL (10 mg) by mouth daily    GABAPENTIN  (NEURONTIN ) 100 MG CAPSULE    Take 1 capsule (100 mg total) by mouth 2 (two) times daily as needed (Neuropathy)    LEVOTHYROXINE  (SYNTHROID ) 25 MCG TABLET    Take 1 tablet (25 mcg total) by mouth every morning    LIDOCAINE  (LIDODERM ) 5 %    Place 1 patch onto the skin in the morning. Remove & Discard patch within 12 hours or as directed by MD.    LISINOPRIL  (ZESTRIL ) 40 MG TABLET    Take 1 tablet (40 mg total) by mouth daily    MECLIZINE  (ANTIVERT ) 25 MG TABLET    Take 1 tablet (25 mg) by mouth 3 (three) times daily as needed for Dizziness    METHOCARBAMOL  (ROBAXIN ) 750 MG TABLET    Take 1 tablet (750 mg) by mouth 3 (three) times daily as needed (for spasm)    MULTIPLE VITAMINS-MINERALS (MULTIVITAMIN WITH MINERALS) TABLET    Take 1 tablet by mouth every morning    PANTOPRAZOLE  (PROTONIX ) 40 MG TABLET    Take 1 tablet (40 mg) by mouth daily    POLYETHYLENE GLYCOL (MIRALAX ) 17 G PACKET    Take 17 g by mouth daily    ROSUVASTATIN  (CRESTOR ) 40 MG TABLET    Take 1 tablet (40 mg) by mouth daily   Modified Medications    No medications on file   Discontinued Medications    No medications on file       This note was generated by the Epic EMR system/ Dragon speech recognition and may contain inherent errors or omissions not intended by the user. Grammatical errors, random word insertions, deletions and pronoun errors  are occasional consequences of this technology due to software limitations. Not all errors are caught or corrected. If there are  questions or concerns about the content of this note or information contained within the body of this dictation they should be addressed directly with the author for clarification.        [1]   Past Surgical History:  Procedure Laterality Date    COLONOSCOPY, DIAGNOSTIC (SCREENING)  2021    COLONOSCOPY, REMOVAL OF TUMOR, POLYP, OR OTHER LESION BY SNARE TECHNIQUE N/A 07/21/2020    Procedure: COLONOSCOPY, POLYPECTOMY;  Surgeon: Ebbie Goldmann, MD;  Location: [facility];  Service: Gastroenterology;  Laterality: N/A;    EGD, BIOPSY N/A 07/21/2020    Procedure: EGD, BIOPSY;  Surgeon: Ebbie Goldmann, MD;  Location: [facility];  Service: Gastroenterology;  Laterality: N/A;    EXPLORATORY LAPAROTOMY N/A 05/22/2021    Procedure: EXPLORATORY LAPAROTOMY;  Surgeon: Levie Ream, MD;  Location: [facility];  Service: General;  Laterality: N/A;    HYSTERECTOMY      LAPAROSCOPY, DIAGNOSTIC N/A 05/22/2021    Procedure: LAPAROSCOPY, DIAGNOSTIC;  Surgeon: Levie Ream, MD;  Location: [facility];  Service: General;  Laterality: N/A;    LAPAROTOMY, COLECTOMY, RIGHT Right 05/22/2021    Procedure: LAPAROTOMY, COLECTOMY, RIGHT;  Surgeon: Levie Ream, MD;  Location: [facility];  Service: General;  Laterality: Right;    MASTECTOMY Right 2015   [2]   Social History  Socioeconomic History    Marital status: Widowed   Tobacco Use    Smoking status: Never    Smokeless tobacco: Never   Vaping Use    Vaping status: Never Used   Substance and Sexual Activity  Alcohol use: Yes     Comment: wine    Drug use: No    Sexual activity: Not Currently     Social Drivers of Health     Financial Resource Strain: Low Risk  (05/12/2022)    Overall Financial Resource Strain (CARDIA)     Difficulty of Paying Living Expenses: Not hard at all   Food Insecurity: No Food Insecurity (08/06/2023)    Hunger Vital Sign     Worried About Running Out of Food in the Last Year: Never true     Ran Out of Food in the Last Year: Never true    Transportation Needs: No Transportation Needs (08/06/2023)    PRAPARE - Therapist, art (Medical): No     Lack of Transportation (Non-Medical): No   Intimate Partner Violence: Not At Risk (08/06/2023)    Humiliation, Afraid, Rape, and Kick questionnaire     Fear of Current or Ex-Partner: No     Emotionally Abused: No     Physically Abused: No     Sexually Abused: No   Housing Stability: Low Risk  (08/06/2023)    Housing Stability Vital Sign     Unable to Pay for Housing in the Last Year: No     Number of Times Moved in the Last Year: 0     Homeless in the Last Year: No   [3]   Family History  Problem Relation Name Age of Onset    Breast cancer Neg Hx          Maryelizabeth Smolder, MD  02/04/24 1535

## 2024-02-04 NOTE — Nursing Progress Note (Signed)
 Pt admitted to the unit from ED. Tele placed. Sinas Brady. SBP elevated >200. Dr Biagio Bucy notified. PRN hydralazine  ordered and adminstered. Home meds reschedule but prn to be given first per MD. SBP 191/68 continue to closely monitor.

## 2024-02-04 NOTE — ED Notes (Signed)
 Bed: E08  Expected date:   Expected time:   Means of arrival:   Comments:  Medic 409

## 2024-02-04 NOTE — EDIE (Signed)
 [facility] NOTIFICATION 02/04/2024 04:47 Emily [name], Emily [name] DOB: May 02, 1946 MRN: [id number]    [place] [name]'s patient encounter information:   VWU:[id number]  Account [id number]  Billing Account [id number]      Criteria Met      5 ED Visits in 12 Months    Security and Safety  No Security Events were found.  ED Care Guidelines  There are currently no ED Care Guidelines for this patient. Please check your facility's medical records system.        Prescription Monitoring Program  Narx Score not available at this time.    E.D. Visit Count (12 mo.)  Facility Visits   [place] [facility] 5   Total 5   Note: Visits indicate total known visits.     Recent Emergency Department Visit Summary  Date Facility [facility] Type Diagnoses or Chief Complaint    Feb 04, 2024  [place] [facility]  [place]  [place]  Emergency      Nausea/Vomitting      Dec 23, 2023  [place] [name]  [place]  [place]  Emergency      Other specified noninflammatory disorders of vagina      Unspecified fall, initial encounter      Lumbago with sciatica, right side      Other chronic pain      Urinary Tract Infection Symptoms      Fall      medic      Nov 25, 2023  [place] [facility]  [place]  [place]  Emergency      Chest pain, unspecified      Dizziness and giddiness      Vascular complications following infusion, transfusion and therapeutic injection, initial encounter      Headache      Dizziness      BODY PAIN      Oct 19, 2023   [name]  [place]  Emergency      Low back pain, unspecified      Pain in right leg      Pain in right shoulder      Other chest pain      Unspecified fall due to ice and snow, initial encounter      Fall      Aug 06, 2023   [name]  [place]  [place]  Emergency      Acute kidney failure, unspecified      Other specified soft tissue disorders      Acute cystitis without hematuria      Leg Swelling      WAISTE AND LEG PAIN        Recent Inpatient Visit Summary  No  Recent Inpatient Visits were found.  Care Team  Provider Specialty Phone Fax Service Dates   Jasmine Estates, [facility] , DO Internal Medicine   Current    DAVIDSON, Nigel Bart MD Melven Stable, MD Family Medicine: Adult Medicine [phone] Current    Charlsie Cool, M.D. Internal Medicine   Current    Rian Chafe, NP Nurse Practitioner: Adult Health [phone]  Current    Sampson Critchley Nurse Practitioner: Gerontology [phone] Current      [facility]  This patient has registered at the [facility] Emergency Department  For more information visit: [email or web address]     PLEASE NOTE:     1.  Any care recommendations and other clinical information are provided as guidelines or for historical purposes only, and providers should exercise their own clinical judgment when providing care.    2.   You may only use this information for purposes of treatment, payment or health care operations activities, and subject to the limitations of applicable [facility] Policies.    3.   You should consult directly with the organization that provided a care guideline or other clinical history with any questions about additional information or accuracy or completeness of information provided.    ? 2025 [facility] - [email or web address]

## 2024-02-05 LAB — COMPREHENSIVE METABOLIC PANEL
ALT: 15 U/L (ref <=55)
AST (SGOT): 23 U/L (ref <=41)
Albumin/Globulin Ratio: 1.3 (ref 0.9–2.2)
Albumin: 3.8 g/dL (ref 3.5–5.0)
Alkaline Phosphatase: 78 U/L (ref 37–117)
Anion Gap: 9 (ref 5.0–15.0)
BUN: 12 mg/dL (ref 7–21)
Bilirubin, Total: 0.2 mg/dL (ref 0.2–1.2)
CO2: 26 meq/L (ref 17–29)
Calcium: 8.9 mg/dL (ref 7.9–10.2)
Chloride: 107 meq/L (ref 99–111)
Creatinine: 0.9 mg/dL (ref 0.4–1.0)
GFR: >60 mL/min/{1.73_m2} (ref >=60.0)
Globulin: 2.9 g/dL (ref 2.0–3.6)
Glucose: 102 mg/dL — ABNORMAL HIGH (ref 70–100)
Potassium: 4.6 meq/L (ref 3.5–5.3)
Protein, Total: 6.7 g/dL (ref 6.0–8.3)
Sodium: 142 meq/L (ref 135–145)

## 2024-02-05 LAB — LAB USE ONLY - CBC WITH DIFFERENTIAL
Absolute Basophils: 0.02 10*3/uL (ref 0.00–0.08)
Absolute Eosinophils: 0.03 10*3/uL (ref 0.00–0.44)
Absolute Immature Granulocytes: 0.01 10*3/uL (ref 0.00–0.07)
Absolute Lymphocytes: 1.83 10*3/uL (ref 0.42–3.22)
Absolute Monocytes: 0.53 10*3/uL (ref 0.21–0.85)
Absolute Neutrophils: 4.89 10*3/uL (ref 1.10–6.33)
Absolute nRBC: 0 10*3/uL (ref <=0.00)
Basophils %: 0.3 %
Eosinophils %: 0.4 %
Hematocrit: 37.3 % (ref 34.7–43.7)
Hemoglobin: 11.6 g/dL (ref 11.4–14.8)
Immature Granulocytes %: 0.1 %
Lymphocytes %: 25 %
MCH: 27.2 pg (ref 25.1–33.5)
MCHC: 31.1 g/dL — ABNORMAL LOW (ref 31.5–35.8)
MCV: 87.6 fL (ref 78.0–96.0)
MPV: 12.2 fL (ref 8.9–12.5)
Monocytes %: 7.3 %
Neutrophils %: 66.9 %
Platelet Count: 275 10*3/uL (ref 142–346)
Preliminary Absolute Neutrophil Count: 4.89 10*3/uL (ref 1.10–6.33)
RBC: 4.26 10*6/uL (ref 3.90–5.10)
RDW: 14 % (ref 11–15)
WBC: 7.31 10*3/uL (ref 3.10–9.50)
nRBC %: 0 /100{WBCs} (ref <=0.0)

## 2024-02-05 LAB — MAGNESIUM: Magnesium: 2.2 mg/dL (ref 1.6–2.6)

## 2024-02-05 LAB — PHOSPHORUS: Phosphorus: 4.4 mg/dL (ref 2.3–4.7)

## 2024-02-05 NOTE — Plan of Care (Addendum)
 [facility] rounding template   02/05/2024    Brief  background  -Pt arrived with Chief Complaint of Nausea, Dizziness, and Hypertension  .   Pt length of stay is day 1  -Pt remains in the hospital for HTN,Nausea    -Last 24 hours critical events : Patient is alert and oriented x4. Did c/o back pain,right sided headache and right sided neck pain ,and nausea at the beginning of the shift. Tylenol  administered with positive result. Ice chips given for the nausea too with positive result.  -Abnormal labs/ VS : BP was elevated at the start of the shift, administered scheduled BP meds and BP is stable now.  -Respiratory  : On room air. No sign of SOB noted.  -GI/GU  No data found.    Intake/Output Summary (Last 24 hours) at 02/05/2024 0534  Last data filed at 02/05/2024 0500  Gross per 24 hour   Intake 160 ml   Output 450 ml   Net -290 ml     .No data found.    -Neuro changes: None  -Nutrition Questions: Pt has poor appetite. Did not eat most of her dinner.  -Activity / Safety/ mobility questions : Pt stayed in bed tonight  -LDA access issues, line removal: None  -Weight trend  Last 3 Weights for the past 72 hrs (Last 3 readings):   Weight   02/04/24 1831 69.9 kg (154 lb 1.6 oz)   02/04/24 1048 75.3 kg (166 lb 0.1 oz)        -Tele/Rhythm: :  NSR  -Skin/Wounds issus/questions: None  -VTE prophylaxis:   Medication VTE Prophylaxis Orders: enoxaparin  (LOVENOX ) syringe 40 mg  Mechanical VTE Prophylaxis Orders: Maintain sequential compression device  -Upcoming Procedures : None  -Discharge Date/ Plans: TBD  -Questions/ orders needed: None

## 2024-02-05 NOTE — Progress Notes (Signed)
 Unsteady on feet when ambulating. PT/OT ordered

## 2024-02-05 NOTE — Progress Notes (Signed)
 Patient has no Psychiatrist sent to finance

## 2024-02-05 NOTE — Progress Notes (Signed)
 [place] Plan: patient plans to discharge back to home with self care  [place] Transportation: patient reports that she will take take a cab, uber, lyft home at discharge  Note: Pt was interviewed at her bedside. She is a 64 yoF that lives alone and shared that she is concerned her family wants her to live in a ALF. She reports that she wants to continue to live independently.  She does all of her shopping and gets to all of her medical appointments without assistance. Pt was given a handout for medication assistance in the community at discharge.       02/05/24 1514   Patient Type   Within 30 Days of Previous Admission? No   Healthcare Decisions   Interviewed: Patient   Orientation/Decision Making Abilities of Patient Alert and Oriented x3, able to make decisions   Advance Directive Patient does not have advance directive   Prior to admission   Prior level of function Independent with ADLs   Type of Residence Private residence   Home Layout One level   Have running water , electricity, heat, etc? Yes   Living Arrangements Alone   How do you get to your MD appointments? self   How do you get your groceries? self   Who fixes your meals? self   Who does your laundry? self   Who picks up your prescriptions? self   Dressing Independent   Grooming Independent   Feeding Independent   Bathing Independent   Toileting Independent   DME Currently at Home BP Cuff   Home Care/Community Services None   Discharge Planning   Support Systems None   Patient expects to be discharged to: discharge back home with self care   [place] discussion contact information: self   Mode of transportation: Taxi/taxi voucher   Does the patient have perscription coverage? No   Financial Resource Strain   How hard is it for you to pay for the very basics like food, housing, medical care, and heating? Not hard   Housing Stability   In the last 12 months, was there a time when you were not able to pay the mortgage or rent on time? N   At any time in the past 12 months,  were you homeless or living in a shelter (including now)? N   Transportation Needs   In the past 12 months, has lack of transportation kept you from medical appointments or from getting medications? no   In the past 12 months, has lack of transportation kept you from meetings, work, or from getting things needed for daily living? No   Family and PCP   PCP on file was verified as the current PCP? Yes   In case you are admitted, transferred or discharged, would like family notified? No   In case you are admitted, transferred or discharged, would like your PCP notified? Yes     Jacky Massing, MSW   Case Management  [facility]  [phone]

## 2024-02-05 NOTE — Consults (Signed)
 CARDIOLOGY HOSPITAL NOTE  Rosamond Comes, [place]   [facility]  Office phone:  [phone]     [facility] phone [phone]  [facility] phone [phone]    Date:  02/05/2024  1:01 PM    Patient:  Emily [name].  DOB: January 28, 1946.  78 y.o.  female , MRN [id number]   Attending:  Delfina Feller, MD   Admission date:  02/04/2024       PHYSICIAN ATTESTATION     I personally spoke to the patient, performed a physical exam, and reviewed pertinent labs/radiology/diagnostic studies.  The medical decision making was formulated by me as documented below.  I agree with the note written by the APP Rosamond Comes) after review and edited with caveats when appropriate.    Assessment and Plan;  78 yo patient presents with N/V and hypertensive urgency.  Noncompliance due to finances.  Continue amlodipine  and lisinopril  .  Repeat ECHO.  Social work eval to help with paying for medications.        Moni Rothrock Montez Ape, MD, [facility]       ASSESSMENT & PLAN:     78 y.o. female with medical history significant for hypertension, hyperlipidemia, breast cancer who presented on 02/04/24 with nausea and vomiting and was found to be hypertensive with BP 220/90.    Hypertensive urgency  - end organ function intact, will order repeat limited echo to rule out acute left ventricular dysfunction (echo in 2021 demonstrated normal biventricular function)  - troponin <2.7, defer further ACS workup   - achieved 25% reduction in BP in first 24 hours, continue amlodipine  10 mg QD and lisinopril  40 mg QD for normotensive goals     Hyperlipidemia  - continue rosuvastatin  40 mg QD     HISTORY OF PRESENT ILLNESS:     78 y.o. female with medical history significant for hypertension, hyperlipidemia, breast cancer who presented on 02/04/24 with nausea and vomiting and was found to be hypertensive with BP 220/90. Pt states she had been feeling increasingly nauseous since early that morning. Of note, pt states she has not been compliant  with her medications, taking them every other day for the past several weeks due to financial burdens. She denies visual or neurological changes, chest pain, shortness of breath, difficulty breathing, or palpitations.     REVIEW OF SYSTEMS:     Review of Systems   Constitutional:  Negative for chills, fever and weight loss.   HENT:  Negative for hearing loss.    Eyes:  Negative for blurred vision and double vision.   Respiratory:  Negative for cough, shortness of breath and wheezing.    Cardiovascular:  Negative for chest pain, palpitations and leg swelling.   Gastrointestinal:  Positive for nausea and vomiting. Negative for abdominal pain, constipation and diarrhea.   Musculoskeletal:  Positive for back pain, joint pain and neck pain.   Neurological:  Negative for dizziness and headaches.        PAST MEDICAL HISTORY:     Past Medical History:   Diagnosis Date    Breast cancer (CMS/HCC) 2014    right breast mastectomy    Exercise-induced angina     negative work up    GERD (gastroesophageal reflux disease)     Headache     resolved    Hyperlipidemia     Hypertension     Hypothyroidism     Low back pain     Malignant neoplasm of overlapping sites of right female  breast (CMS/HCC) 10/19/2015    Syncope and collapse     Chronic Vertigo    Vertigo      Past Surgical History[1]    SOCIAL HISTORY:     Social History     Tobacco Use    Smoking status: Never    Smokeless tobacco: Never   Substance Use Topics    Alcohol use: Yes     Comment: wine       FAMILY HISTORY:   family history is not on file.    ALLERGIES:   Allergies[2]     MEDICATIONS:    INFUSION MEDS:      SCHEDULED MEDS:     Current Facility-Administered Medications   Medication Dose Route Frequency    amLODIPine   10 mg Oral Daily    anastrozole   1 mg Oral QAM    aspirin   81 mg Oral Daily    enoxaparin   40 mg Subcutaneous Daily    furosemide   40 mg Intravenous Once    lisinopril   40 mg Oral Daily    pantoprazole   40 mg Oral QAM AC    rosuvastatin   40 mg Oral Daily       PRN MEDS:   acetaminophen , benzocaine -menthol , benzonatate , carboxymethylcellulose sodium, dextrose  **OR** dextrose  **OR** dextrose  **OR** glucagon  (rDNA), hydrALAZINE , labetalol , magnesium  sulfate, meclizine , melatonin, naloxone , ondansetron , potassium & sodium phosphates , potassium chloride  **OR** potassium chloride  **OR** potassium chloride , saline     DIAGNOSTICS:     02/05/24: TTE  Pending     02/05/24: Telemetry  Normal sinus at 70 BPM     02/04/24: ECG  Sinus nradycardia at 59 BPM    01/28/20: TTE  * Left ventricular ejection fraction is normal with an estimated ejection  fraction of 60-65%.    * Left ventricular wall thickness is mildly increased.    * No significant valvular dysfunction.    04/18/19: Cardiac stress test  Negative for ischemia    LABS:     Recent Labs   Lab 02/05/24  0653 02/04/24  1103   WBC 7.31 5.55   Hemoglobin 11.6 11.1*   Hematocrit 37.3 36.6   Platelet Count 275 269     Recent Labs   Lab 02/05/24  0653 02/04/24  1103   Sodium 142 141   Potassium 4.6 4.1   Chloride 107 108   CO2 26 26   BUN 12 16   Creatinine 0.9 0.9   GFR >60.0 >60.0   Glucose 102* 149*   Calcium  8.9 8.6     Recent Labs   Lab 02/04/24  1103   hs Troponin <2.7            FLUID BALANCE AND WEIGHT:                                           Last filed weight: Weight: 69.9 kg (154 lb 1.6 oz)  Weight on Admission: Weight: 75.3 kg (166 lb 0.1 oz)  Net IO Since Admission: -290 mL [02/05/24 1301]     Intake/Output Summary (Last 24 hours) at 02/05/2024 0700  Last data filed at 02/05/2024 0500  Gross per 24 hour   Intake 160 ml   Output 450 ml   Net -290 ml   Weight change:     Last 3 Weights for the past 72 hrs (Last 3 readings):   Weight   02/04/24 1831  69.9 kg (154 lb 1.6 oz)   02/04/24 1048 75.3 kg (166 lb 0.1 oz)        PHYSICAL EXAM:                                                                 BP 124/59   Pulse 74   Temp 97.6 F (36.4 C) (Oral)   Resp 13   Ht 1.346 m (4\' 5" )   Wt 69.9 kg (154 lb 1.6 oz)   SpO2 99%    BMI 38.57 kg/m     Physical Exam  Constitutional:       General: She is not in acute distress.     Appearance: Normal appearance.   Cardiovascular:      Rate and Rhythm: Normal rate and regular rhythm.      Pulses: Normal pulses.      Heart sounds: Normal heart sounds.   Pulmonary:      Effort: Pulmonary effort is normal. No respiratory distress.      Breath sounds: Normal breath sounds. No wheezing.   Abdominal:      General: Abdomen is flat. There is no distension.      Palpations: Abdomen is soft.      Tenderness: There is no abdominal tenderness.   Musculoskeletal:         General: No swelling.      Right lower leg: No edema.      Left lower leg: No edema.   Neurological:      General: No focal deficit present.      Mental Status: She is alert and oriented to person, place, and time.                [1]   Past Surgical History:  Procedure Laterality Date    COLONOSCOPY, DIAGNOSTIC (SCREENING)  2021    COLONOSCOPY, REMOVAL OF TUMOR, POLYP, OR OTHER LESION BY SNARE TECHNIQUE N/A 07/21/2020    Procedure: COLONOSCOPY, POLYPECTOMY;  Surgeon: Ebbie Goldmann, MD;  Location: [facility];  Service: Gastroenterology;  Laterality: N/A;    EGD, BIOPSY N/A 07/21/2020    Procedure: EGD, BIOPSY;  Surgeon: Ebbie Goldmann, MD;  Location: [facility];  Service: Gastroenterology;  Laterality: N/A;    EXPLORATORY LAPAROTOMY N/A 05/22/2021    Procedure: EXPLORATORY LAPAROTOMY;  Surgeon: Levie Ream, MD;  Location: [facility];  Service: General;  Laterality: N/A;    HYSTERECTOMY      LAPAROSCOPY, DIAGNOSTIC N/A 05/22/2021    Procedure: LAPAROSCOPY, DIAGNOSTIC;  Surgeon: Levie Ream, MD;  Location: [facility];  Service: General;  Laterality: N/A;    LAPAROTOMY, COLECTOMY, RIGHT Right 05/22/2021    Procedure: LAPAROTOMY, COLECTOMY, RIGHT;  Surgeon: Levie Ream, MD;  Location: [facility];  Service: General;  Laterality: Right;    MASTECTOMY Right 2015   [2]   Allergies  Allergen Reactions    Morphine   Itching    Morphine  And Codeine  Itching and Nausea And Vomiting     dizzy

## 2024-02-05 NOTE — Progress Notes (Signed)
 [facility]  Internal Medicine Hospitalists  Progress Note        Assessment / Plan:        Emily [name] is a 78 y.o. female with Past medical history of breast cancer, hypertension, hyperlipidemia, GERD, came in today with dizziness/vomiting, found to be hypertensive to 220/90, admitted with hypertensive emergency     #Hypertensive emergency  Initially hypertensive to 220/90, improved with labetalol   D-dimer elevated, CT chest negative for PE showing cardiomegaly and mild atelectasis  Trop/BNP normal  - Achieved 25% reduction in BP in first 24 hours, continue amlodipine  10 mg QD and lisinopril  40 mg QD for normotensive goals   - TTE ordered per cardiology to rule out acute LV dysfunction     # Dizziness, ambulatory dysfunction  PT/OT       #Right arm numbness and tingling  Pt states its related to shoulder surgery a few years back   CT head negative, MRI negative   Lipid panel, TSH, A1c unremarkable      #History of breast cancer, continue previous meds     #Hyperlipidemia, continue statin     #GERD, continue Protonix     #Social issues  Patient states that she has not been compliant with her medications taking them every other day for the past several weeks due to financial burdens  Case manager consulted          VTE Prophylaxis: enoxaparin  (LOVENOX ) syringe 40 mg  Place sequential compression device     Foley Catheter: No Foley Present    Venous Access: No Temporary Central Line Present    Medical Readiness for Discharge: Anticipated Tomorrow    Open Handoff Activity in Sidebar    Additional Diagnoses:               Subjective:      Pt doing okay today. Denies any CP or SOB. Continues to endorse dizziness and trouble walking            Objective:      Temp:  [97.6 F (36.4 C)-98.8 F (37.1 C)] 98.2 F (36.8 C)  Heart Rate:  [53-76] 74  Resp Rate:  [13-20] 14  BP: (108-212)/(47-86) 127/64   General: awake, alert, oriented; no acute distress  HEENT: anicteric sclerae,, EOMI;  MMM  Cardiovascular: regular rate and rhythm; no murmurs, rubs, or gallops  Lungs: clear to auscultation bilaterally without wheezing, rhonchi, or rales  Abdomen: soft, non-distended; non-tender to palpation, no rebound or guarding  Extremities: warm; no LE edema; No upper ext edema noted. Right leg slighter bigger than left  Neuro: symmetric facial movements, clear speech, moving all extremities

## 2024-02-05 NOTE — Plan of Care (Signed)
 Problem: Moderate/High Fall Risk Score >5  Goal: Patient will remain free of falls  Outcome: Progressing  Flowsheets (Taken 02/04/2024 2200)  High (Greater than 13):   HIGH-Consider use of low bed   HIGH-Pharmacy to initiate evaluation and intervention per protocol   HIGH-Apply yellow "Fall Risk" arm band   HIGH-Utilize chair pad alarm for patient while in the chair   HIGH-Bed alarm on at all times while patient in bed   HIGH-Visual cue at entrance to patient's room     Problem: Pain interferes with ability to perform ADL  Goal: Pain at adequate level as identified by patient  Outcome: Progressing  Flowsheets (Taken 02/05/2024 0115)  Pain at adequate level as identified by patient:   Identify patient comfort function goal   Assess pain on admission, during daily assessment and/or before any "as needed" intervention(s)   Reassess pain within 30-60 minutes of any procedure/intervention, per Pain Assessment, Intervention, Reassessment (AIR) Cycle   Evaluate if patient comfort function goal is met   Evaluate patient's satisfaction with pain management progress   Offer non-pharmacological pain management interventions   Consult/collaborate with Pain Service   Include patient/patient care companion in decisions related to pain management as needed     Problem: Safety  Goal: Patient will be free from injury during hospitalization  Outcome: Progressing  Flowsheets (Taken 02/05/2024 0115)  Patient will be free from injury during hospitalization:   Assess patient's risk for falls and implement fall prevention plan of care per policy   Provide and maintain safe environment   Use appropriate transfer methods   Ensure appropriate safety devices are available at the bedside   Include patient/ family/ care giver in decisions related to safety   [place] rounding  Goal: Patient will be free from infection during hospitalization  Outcome: Progressing  Flowsheets (Taken 02/05/2024 0115)  Free from Infection during hospitalization:   Assess  and monitor for signs and symptoms of infection   Monitor lab/diagnostic results   Monitor all insertion sites (i.e. indwelling lines, tubes, urinary catheters, and drains)   Encourage patient and family to use good hand hygiene technique     Problem: Pain  Goal: Pain at adequate level as identified by patient  Outcome: Progressing  Flowsheets (Taken 02/05/2024 0115)  Pain at adequate level as identified by patient:   Identify patient comfort function goal   Assess pain on admission, during daily assessment and/or before any "as needed" intervention(s)   Reassess pain within 30-60 minutes of any procedure/intervention, per Pain Assessment, Intervention, Reassessment (AIR) Cycle   Evaluate if patient comfort function goal is met   Evaluate patient's satisfaction with pain management progress   Offer non-pharmacological pain management interventions   Consult/collaborate with Pain Service   Include patient/patient care companion in decisions related to pain management as needed     Problem: Hemodynamic Status: Cardiac  Goal: Stable vital signs and fluid balance  Outcome: Progressing  Flowsheets (Taken 02/05/2024 0115)  Stable vital signs and fluid balance:   Assess signs and symptoms associated with cardiac rhythm changes   Monitor lab values

## 2024-02-06 ENCOUNTER — Observation Stay: Payer: Self-pay

## 2024-02-06 ENCOUNTER — Observation Stay (HOSPITAL_BASED_OUTPATIENT_CLINIC_OR_DEPARTMENT_OTHER): Payer: Self-pay

## 2024-02-06 DIAGNOSIS — Z853 Personal history of malignant neoplasm of breast: Secondary | ICD-10-CM

## 2024-02-06 DIAGNOSIS — I161 Hypertensive emergency: Secondary | ICD-10-CM

## 2024-02-06 LAB — LAB USE ONLY - CBC WITH DIFFERENTIAL
Absolute Basophils: 0.04 10*3/uL (ref 0.00–0.08)
Absolute Eosinophils: 0.04 10*3/uL (ref 0.00–0.44)
Absolute Immature Granulocytes: 0.01 10*3/uL (ref 0.00–0.07)
Absolute Lymphocytes: 1.98 10*3/uL (ref 0.42–3.22)
Absolute Monocytes: 0.43 10*3/uL (ref 0.21–0.85)
Absolute Neutrophils: 3.47 10*3/uL (ref 1.10–6.33)
Absolute nRBC: 0 10*3/uL (ref <=0.00)
Basophils %: 0.7 %
Eosinophils %: 0.7 %
Hematocrit: 35.6 % (ref 34.7–43.7)
Hemoglobin: 11.1 g/dL — ABNORMAL LOW (ref 11.4–14.8)
Immature Granulocytes %: 0.2 %
Lymphocytes %: 33.2 %
MCH: 27.3 pg (ref 25.1–33.5)
MCHC: 31.2 g/dL — ABNORMAL LOW (ref 31.5–35.8)
MCV: 87.5 fL (ref 78.0–96.0)
MPV: 11.7 fL (ref 8.9–12.5)
Monocytes %: 7.2 %
Neutrophils %: 58 %
Platelet Count: 285 10*3/uL (ref 142–346)
Preliminary Absolute Neutrophil Count: 3.47 10*3/uL (ref 1.10–6.33)
RBC: 4.07 10*6/uL (ref 3.90–5.10)
RDW: 14 % (ref 11–15)
WBC: 5.97 10*3/uL (ref 3.10–9.50)
nRBC %: 0 /100{WBCs} (ref <=0.0)

## 2024-02-06 LAB — ECHO ADULT TTE LIMITED
BP Mod LV Ejection Fraction: 70
IVS Diastolic Thickness (2D): 1.04
LVID diastole (2D): 4.01
LVID systole (2D): 2.15
Mitral Valve Findings: NORMAL

## 2024-02-06 LAB — COMPREHENSIVE METABOLIC PANEL
ALT: 10 U/L (ref <=55)
AST (SGOT): 22 U/L (ref <=41)
Albumin/Globulin Ratio: 1.3 (ref 0.9–2.2)
Albumin: 3.7 g/dL (ref 3.5–5.0)
Alkaline Phosphatase: 70 U/L (ref 37–117)
Anion Gap: 5 (ref 5.0–15.0)
BUN: 15 mg/dL (ref 7–21)
Bilirubin, Total: 0.3 mg/dL (ref 0.2–1.2)
CO2: 31 meq/L — ABNORMAL HIGH (ref 17–29)
Calcium: 9.2 mg/dL (ref 7.9–10.2)
Chloride: 105 meq/L (ref 99–111)
Creatinine: 0.9 mg/dL (ref 0.4–1.0)
GFR: >60 mL/min/{1.73_m2} (ref >=60.0)
Globulin: 2.8 g/dL (ref 2.0–3.6)
Glucose: 97 mg/dL (ref 70–100)
Potassium: 4.6 meq/L (ref 3.5–5.3)
Protein, Total: 6.5 g/dL (ref 6.0–8.3)
Sodium: 141 meq/L (ref 135–145)

## 2024-02-06 LAB — MAGNESIUM: Magnesium: 2.3 mg/dL (ref 1.6–2.6)

## 2024-02-06 LAB — PHOSPHORUS: Phosphorus: 3.6 mg/dL (ref 2.3–4.7)

## 2024-02-06 MED ORDER — HYDROCHLOROTHIAZIDE 12.5 MG PO TABS
12.5000 mg | ORAL_TABLET | Freq: Every day | ORAL | Status: DC
Start: 2024-02-06 — End: 2024-02-07
  Administered 2024-02-06 – 2024-02-07 (×2): 12.5 mg via ORAL
  Filled 2024-02-06 (×2): qty 1

## 2024-02-06 NOTE — Plan of Care (Signed)
 Problem: Safety  Goal: Patient will be free from injury during hospitalization  Outcome: Progressing  Flowsheets (Taken 02/06/2024 0253)  Patient will be free from injury during hospitalization:   Assess patient's risk for falls and implement fall prevention plan of care per policy   Ensure appropriate safety devices are available at the bedside   Provide and maintain safe environment   Include patient/ family/ care giver in decisions related to safety   Use appropriate transfer methods   [place] rounding     Problem: Hemodynamic Status: Cardiac  Goal: Stable vital signs and fluid balance  Outcome: Progressing  Flowsheets (Taken 02/06/2024 0253)  Stable vital signs and fluid balance:   Assess signs and symptoms associated with cardiac rhythm changes   Monitor lab values

## 2024-02-06 NOTE — Progress Notes (Signed)
 CM Update:       Pt noted with [employer] as [employer].   CM placed [facility] consult for home health SN/PT/OT/FWW.       Marlowe Sinclair, BSN, RN  RN Case Manager   [phone]  (F): [phone]

## 2024-02-06 NOTE — Progress Notes (Signed)
 CARDIOLOGY HOSPITAL NOTE  Rosamond Comes, [place]   [facility]  Office phone:  [phone]     [facility] phone [phone]  [facility] phone [phone]    Date:  02/06/2024  11:50 AM    Patient:  Emily [name].  DOB: January 12, 1946.  78 y.o.  female , MRN: [id number]   Attending:  Delfina Feller, MD   Admission date:  02/04/2024       PHYSICIAN ATTESTATION     I personally spoke to the patient, performed a physical exam, and reviewed pertinent labs/radiology/diagnostic studies.  The medical decision making was formulated by me as documented below.  I agree with the note written by the APP Rosamond Comes) after review and edited with caveats when appropriate.    Assessment and Plan;  78 yo patient presents with N/V -          1) hypertensive urgency.  Noncompliance due to finances.  BP improved 127-164 systolic.  Continue amlodipine  and lisinopril  and add HCTZ.  ECHO normal EF Potential discharge in am  .  -          2) hyperlipidemia .  Continue crestor .  D/c ECASA         Glendi Mohiuddin Montez Ape, MD, [facility]       ASSESSMENT & PLAN:     78 y.o.female with hypertension, hyperlipidemia, breast cancer who presented on 02/04/24 with nausea and vomiting and was found to be hypertensive with BP 220/90.     Hypertensive urgency, primary diagnosis   - echo on 02/06/24 demonstrated normal LV global systolic function with EF 65-70%   - improved blood pressure control, SBP 134-164  - continue amlodipine  10 mg QD, Lisinopril  40 mg QD, add HCTZ 12.5 mg QD     Hyperlipidemia  - continue rosuvastatin  40 mg QD     INTERVAL HISTORY:     BP controlled  TTE with EF 65-70%    MEDICATIONS:    INFUSION MEDS:      SCHEDULED MEDS:     Current Facility-Administered Medications   Medication Dose Route Frequency    amLODIPine   10 mg Oral Daily    anastrozole   1 mg Oral QAM    aspirin   81 mg Oral Daily    enoxaparin   40 mg Subcutaneous Daily    furosemide   40 mg Intravenous Once    lisinopril   40 mg Oral Daily     pantoprazole   40 mg Oral QAM AC    rosuvastatin   40 mg Oral Daily      PRN MEDS:   acetaminophen , benzocaine -menthol , benzonatate , carboxymethylcellulose sodium, dextrose  **OR** dextrose  **OR** dextrose  **OR** glucagon  (rDNA), hydrALAZINE , labetalol , magnesium  sulfate, meclizine , melatonin, naloxone , ondansetron , potassium & sodium phosphates , potassium chloride  **OR** potassium chloride  **OR** potassium chloride , saline    DIAGNOSTICS:       02/06/24: TTE  Summary    * Limited study for LVEF assessment.    * Left ventricular global systolic function is normal.  Calculated LV  ejection fraction is  70 % by the Simpsons biplane method and 65-70% visually.    * Compared to the prior study dated 01/28/2020, there has been no significant  change.     02/06/24: Telemetry  Normal sinus at 64 BPM      02/04/24: ECG  Sinus nradycardia at 59 BPM     01/28/20: TTE  * Left ventricular ejection fraction is normal with an estimated ejection  fraction of 60-65%.    *  Left ventricular wall thickness is mildly increased.    * No significant valvular dysfunction.     04/18/19: Cardiac stress test  Negative for ischemia    LABS:   Labs reviewed:  Recent Labs   Lab 02/06/24  0632 02/05/24  0653 02/04/24  1103   WBC 5.97 7.31 5.55   Hemoglobin 11.1* 11.6 11.1*   Hematocrit 35.6 37.3 36.6   Platelet Count 285 275 269     Recent Labs   Lab 02/06/24  0632 02/05/24  0653 02/04/24  1103   Sodium 141 142 141   Potassium 4.6 4.6 4.1   Chloride 105 107 108   CO2 31* 26 26   BUN 15 12 16    Creatinine 0.9 0.9 0.9   GFR >60.0 >60.0 >60.0   Glucose 97 102* 149*   Calcium  9.2 8.9 8.6     Recent Labs   Lab 02/04/24  1103   hs Troponin <2.7             FLUID BALANCE AND WEIGHT:                                           Last filed weight: Weight: 69.9 kg (154 lb 1.6 oz)  Weight on Admission: Weight: 75.3 kg (166 lb 0.1 oz)  Net IO Since Admission: -240 mL [02/06/24 1150]   No intake or output data in the 24 hours ending 02/06/24 0700Weight change:     Last 3  Weights for the past 72 hrs (Last 3 readings):   Weight   02/04/24 1831 69.9 kg (154 lb 1.6 oz)   02/04/24 1048 75.3 kg (166 lb 0.1 oz)        PHYSICAL EXAM:                                              BP 134/85   Pulse 66   Temp 98.2 F (36.8 C) (Oral)   Resp 15   Ht 1.346 m (4\' 5" )   Wt 69.9 kg (154 lb 1.6 oz)   SpO2 96%   BMI 38.57 kg/m     Physical Exam  Constitutional:       Appearance: Normal appearance.   Cardiovascular:      Rate and Rhythm: Normal rate and regular rhythm.      Pulses: Normal pulses.      Heart sounds: Normal heart sounds.   Pulmonary:      Effort: Pulmonary effort is normal. No respiratory distress.      Breath sounds: Normal breath sounds. No wheezing.   Abdominal:      General: Abdomen is flat. There is no distension.      Palpations: Abdomen is soft.      Tenderness: There is no abdominal tenderness.   Musculoskeletal:      Right lower leg: No edema.      Left lower leg: No edema.   Skin:     General: Skin is warm.   Neurological:      General: No focal deficit present.      Mental Status: She is alert and oriented to person, place, and time.

## 2024-02-06 NOTE — Discharge Instr - AVS First Page (Addendum)
 Reason for your Hospital Admission:  ***      Instructions for after your discharge:  ***             Home Health Discharge Information    Your doctor has ordered Skilled Nursing, Physical Therapy, and Occupational Therapy in-home service(s) for you while you recuperate at home, to assist you in the transition from hospital to home.    The agency that you or your representative chose to provide the service:        [facility]    [phone]      The above services were set up by:  Johnathan Myron, RN (Post Acute Care Coordinator)   Phone: [phone]      IF YOU HAVE NOT HEARD FROM YOUR HOME HEALTH AGENCY WITHIN 24-48 HOURS AFTER DISCHARGE PLEASE CALL YOUR AGENCY TO ARRANGE A TIME FOR YOUR FIRST VISIT. FOR ANY SCHEDULING CONCERNS OR QUESTIONS RELATED TO HOME HEALTH, SUCH AS TIME OR DATE PLEASE CONTACT YOUR HOME HEALTH AGENCY AT THE NUMBER LISTED ABOVE.

## 2024-02-06 NOTE — OT Eval Note (Signed)
 Occupational Therapy Evaluation  Emily [name]        Post Acute Care Therapy Recommendations:     Discharge Recommendations:  Home with home health OT, Home with supervision, 24/7 SPV d/t balance and cognition.    DME needs IF patient is discharging home: Front wheel walker    Therapy discharge recommendations may change with patient status.  Please refer to most recent note for up-to-date recommendations.         [facility]  [address]  [place], [place] [address]  [phone]    Occupational Therapy Evaluation    Patient: Emily [name] MRN: [id number]   Unit: [facility] INTERMEDIATE CARE Bed: MW102/VO536-64    Time of treatment:   OT Received On: 02/06/24  Start Time: 1458  Stop Time: 1546  Time Calculation (min): 48 min       Consult received for Emily Peat Swett for OT evaluation and treatment.  Patient's medical condition is appropriate for Occupational Therapy  intervention at this time.  Assessment     Emily [name] is a 78 y.o. female admitted 02/04/2024 for dizziness and vomiting, confusion, hypertensive emergency.    At baseline, pt is indep.  This date, pt required minA for bed mobility, SBA for sitting balance, CGA for standing balance, CGA for fxnl tux and mobility in prep for ADL. Pt required SBA for safety in stance for G/H, SBA for UB ADL, minA for LB ADL and SPV for toileting needs. Pt's indep with ADL is impacted by: impaired activity tolerance, balance, strength, impaired cognition/confusion, decr safety awareness and insight to deficits, pain.        Expanded chart review completed including review of labs, review of imaging, and review of vitals.  Pt's ability to complete ADLs and functional transfers is impaired due to the following deficits: decreased activity tolerance, decreased balance, decreased bed mobility, confusion, gait impairment, decreased insight, decreased judgment, decreased memory, decreased safety awareness, pain, decreased strength, and  transfers .  Pt demonstrates performance deficits with grooming, bathing, dressing, toileting, and functional mobility. There are a few comorbidities or other factors that affect plan of care and require modification of task including: has stairs to manage and lives alone. Standardized tests and exams incorporated into evaluation include AM-PAC Daily Activity, balance, coordination, vision, mobility, ROM/strength, and cognition/orientation. Pt would continue to benefit from OT to address these deficits and increase functional independence.        Complexity Chart Review Performance Deficits Clinical Decision Making Hx/Comorbidities Assistance needed   Moderate Expanded 3-5 Several Options 1-2 Min/Mod assist (not at baseline)     PMP - Progressive Mobility Protocol   PMP Activity: Step 6 - Walks in Room  Distance Walked (ft) (Step 6,7): 15 Feet     Rehabilitation Potential: Good/gaurded pending cognition    Interdisciplinary Communication: RN/MD re. Pt status    Plan     OT Plan  Risks/Benefits/POC Discussed with Pt/Family: With patient  Treatment Interventions: ADL retraining;Functional transfer training;UE strengthening/ROM;Endurance training;Patient/Family training;Equipment eval/education;Compensatory technique education  Discharge Recommendation: Home with home health OT;Home with supervision  DME Recommended for Discharge: Front wheel walker  OT Frequency Recommended: 3-4x/wk         Medical Diagnosis: Hypertensive emergency [I16.1]    History of Present Illness: Emily [name] is a 78 y.o. female admitted on  02/04/2024 with "dizziness and vomiting. Pt found to be in hypertensive emergency"      Problem List[1]  Medical History[2]  Past Surgical  History[3]    Tests/Labs:  Lab Results   Component Value Date/Time    HGB 11.1 (L) 02/06/2024 06:32 AM    HGB 11.1 (L) 01/30/2023 03:56 PM    HCT 35.6 02/06/2024 06:32 AM    HCT 36.1 01/30/2023 03:56 PM    K 4.6 02/06/2024 06:32 AM    K 4.1 01/30/2023 03:56 PM     NA 141 02/06/2024 06:32 AM    NA 139 01/30/2023 03:56 PM    INR 1.0 11/25/2023 08:00 AM    INR 1.0 01/30/2023 03:56 PM    TROPI <2.7 02/04/2024 11:03 AM    TROPI <2.7 11/25/2023 08:00 AM    TROPI 3.7 08/06/2023 09:38 AM    TROPI <2.7 01/30/2023 03:56 PM    TROPI <2.7 11/21/2022 12:16 PM    TROPI <2.7 10/30/2022 03:02 PM    TROPI Unable toCalc. 10/30/2022 03:02 PM    TROPI <2.7 10/30/2022 12:36 PM    TROPI 0.01 07/15/2009 09:40 AM         Imaging:  US  Venous Duplex Doppler Leg Right  Result Date: 02/06/2024   No evidence of right lower extremity DVT. Maximiano Spanish, MD 02/06/2024 9:25 AM    MRI Brain WO Contrast  Result Date: 02/04/2024   1.No acute intracranial abnormality. Specifically, no acute infarct. 2.Mild chronic small vessel ischemic disease and generalized cerebral volume loss. Milan Tony Frederickson, MD 02/04/2024 2:59 PM    CT Head WO Contrast  Result Date: 02/04/2024   1.Stable partially empty sella. 2.No intracranial hemorrhage or mass is seen. 3.There is been no change since the prior study of December 23, 2023. Mervin Abu, MD 02/04/2024 12:46 PM    CT Angio Chest (PE study)  Result Date: 02/04/2024  1.No pulmonary embolism. 2.Cardiomegaly. 3.Mild atelectasis. Marc Senior, MD 02/04/2024 12:17 PM    XR Chest  AP Portable  Result Date: 02/04/2024  Moderate pulmonary edema. Regino Caprio, MD 02/04/2024 11:37 AM       Social History: (Per family/care provider, if patient is not able to give accurate report)  Lives alone in an apartment.  Entry Steps: 1 FOS   Rails: Y Inside steps: 0   Equipment at home:  No equipment needs and walk in shower  Prior Level of Function:  Walks most places, takes taxi to appointments. Cleans, cooks, active, states family is out of state              Cognition: intact                        Mobility/Locomotion: Independent              Feeding: Independent              Grooming: Independent              Bathing: Independent              Dressing: Independent              Toileting:  Independent    Subjective   "My head and neck hurts"  Patient is agreeable to participation in the therapy session. Nursing clears patient for therapy.  Patient's Goal:  Resolution of symptoms  Pain: Pt unable to quantify  Location: posterior head/neck/ low back  Therapist Intervention: positioned for comfort  Patient is satisfied with therapist intervention.    Objective     Precautions:  Precautions  Weight Bearing Status: no restrictions  Other  Precautions: fall risk    Patient is in bed with intravenous access in place.       Observation of patient/vitals:   Vitals:    02/06/24 1102 02/06/24 1104 02/06/24 1146 02/06/24 1733   BP: 164/66 150/72 134/85 139/76   Pulse: (!) 59 64 66 (!) 53   Resp:    14   Temp:   98.2 F (36.8 C) 98.2 F (36.8 C)   TempSrc:   Oral Oral   SpO2:   96% 98%   Weight:       Height:           Orientation/Cognition:     Alert and Oriented x2-3 (states 2004, unsure why she is here and declines memory of recent events)  Cognition: somewhat confused and questionable historian, endorses feeling increasingly confused recently leading up to admission ? Unreliable report. Impaired insight/memory, fair command following       Musculoskeletal Examination:     ROM Strength   Neck/ Trunk WFL WFL   RUE WFL 3+/5   LUE Reports hx shoulder sx 2/5         Sensation: Intact to light touch, denies numbness/tingling throughout BUE  Coordination: Intact gross motor and serial opposition to B hands  Vision: WFL with glasses, not accurately following commands for formal visual screen   Hearing: WFL      Endurance: G    Participation:  G    Education:  Educated the patient/family/caregiver to role of occupational therapy, plan of care, goals  of therapy, rationale for progressing mobility and safety with mobility and ADLs, discharge instructions, and home safety.    RN notified of session outcome and that patient was left in semisupine with all needs met and equipment intact.   Safety measures include: handoff  to nurse/clin tech/ unit secretary completed, bed alarm activated, oriented to call bell and placed within reach, personal items within reach, assistive device positioned out of reach, and bed placed in lowest position.   Mobility and ADL status posted at bedside and within E.M.R.        AM-PACT "6 Clicks" Daily Activity Inpatient Short Form  Inpatient AM-PACT Performed?: yes  Put On/Take Off Lower Body Clothing: 3  Assist with Bathing: 3  Assist with Toileting: 3  Put On/Take Off Upper Body Clothing: 3  Assist with Grooming: 3  Assist with Eating: 4  OT Daily Activity Raw Score: 19  CMS 0-100% Score: 42.80%       Goals:  Goals  Goal Formulation: Patient  Time For Goal Achievement: by time of discharge  Goals: Select goal  Patient will dress upper body: Modified Independent  Patient will dress lower body: Modified Independent  Patient will toilet: Modified Independent  Pt will perform functional transfers: Modified Independent      Signature:   Carron Clap, OT  02/06/2024  5:43 PM             [1]   Patient Active Problem List  Diagnosis    Malignant neoplasm of overlapping sites of right female breast (CMS/HCC)    Hypertension    Mixed hyperlipidemia    Hypothyroid    Left-sided weakness    Right sided numbness    COVID-19 ruled out by laboratory testing    Abnormal CT scan, gastrointestinal tract    History of hypothyroidism    History of breast cancer    History of colonic polyps    Dizziness    Left sided numbness  Hypertensive emergency   [2]   Past Medical History:  Diagnosis Date    Breast cancer (CMS/HCC) 2014    right breast mastectomy    Exercise-induced angina     negative work up    GERD (gastroesophageal reflux disease)     Headache     resolved    Hyperlipidemia     Hypertension     Hypothyroidism     Low back pain     Malignant neoplasm of overlapping sites of right female breast (CMS/HCC) 10/19/2015    Syncope and collapse     Chronic Vertigo    Vertigo    [3]   Past Surgical History:  Procedure  Laterality Date    COLONOSCOPY, DIAGNOSTIC (SCREENING)  2021    COLONOSCOPY, REMOVAL OF TUMOR, POLYP, OR OTHER LESION BY SNARE TECHNIQUE N/A 07/21/2020    Procedure: COLONOSCOPY, POLYPECTOMY;  Surgeon: Ebbie Goldmann, MD;  Location: [facility];  Service: Gastroenterology;  Laterality: N/A;    EGD, BIOPSY N/A 07/21/2020    Procedure: EGD, BIOPSY;  Surgeon: Ebbie Goldmann, MD;  Location: [facility];  Service: Gastroenterology;  Laterality: N/A;    EXPLORATORY LAPAROTOMY N/A 05/22/2021    Procedure: EXPLORATORY LAPAROTOMY;  Surgeon: Levie Ream, MD;  Location: [facility];  Service: General;  Laterality: N/A;    HYSTERECTOMY      LAPAROSCOPY, DIAGNOSTIC N/A 05/22/2021    Procedure: LAPAROSCOPY, DIAGNOSTIC;  Surgeon: Levie Ream, MD;  Location: [facility];  Service: General;  Laterality: N/A;    LAPAROTOMY, COLECTOMY, RIGHT Right 05/22/2021    Procedure: LAPAROTOMY, COLECTOMY, RIGHT;  Surgeon: Levie Ream, MD;  Location: [facility];  Service: General;  Laterality: Right;    MASTECTOMY Right 2015

## 2024-02-06 NOTE — PT Progress Note (Signed)
 [facility]  Physical Therapy Attempt Note    Patient:  Rhodes Borunda Quinnell MRN#:  [id number]    Unit:  [facility] INTERMEDIATE CARE Room/Bed:  AO130/QM578-46    PT Cancellation: Visit  PT Visit Cancellation Reason: Testing/Procedure           Signature:   Rometta Coad, PT  02/06/2024  8:58 AM       (For scheduling questions, please contact rehab tech x [phone])

## 2024-02-06 NOTE — PT Eval Note (Cosign Needed)
 Physical Therapy Evaluation  Emily [name]      Post Acute Care Therapy Recommendations:     Discharge Recommendations:  Home with supervision, Home with home health PT, Home with home health OT    DME needs IF patient is discharging home: Front wheel walker    Therapy discharge recommendations may change with patient status.  Please refer to most recent note for up-to-date recommendations.           [facility]  [address]  [place], [place] [address]  [phone]    Physical Therapy Evaluation    Patient: Emily [name] MRN: [id number]   Unit: [facility] INTERMEDIATE CARE Bed: [id number]/[id number]    Time of Treatment:   PT Received On: 02/06/24  Start Time: 1005  Stop Time: 1018  Time Calculation (min): 13 min       Consult received for Emily Peat Stribling for PT evaluation and treatment.  Patient's medical condition is appropriate for Physical Therapy  intervention at this time.    Interpreter utilized: no, not indicated      Assessment   Emily [name] is a 78 y.o. female admitted 02/04/2024 due to complaints of dizziness and vomiting. Pt found to be in hypertensive emergency.   At baseline, pt lives alone in an apartment. Pt reports independence for all mobility and ADLs. States she does not use an AD.   Pt received ambulating out of bathroom with RN. Agreeable to PT evaluation. Reports dizziness during all mobility that is unchanged with mobility, head movements, or rest. Does not describe spinning or vertigo sensation. Performs hand hygiene and brushes teeth with SBA. Performs gait within room x 20 feet with CG-minA and no AD. Performs STS from EOB with CGA. Performs bed mobility with SBA. Notes feeling increased nausea and states "I feel terrible", declines further mobility.   Pt is presenting below her stated functional baseline and will benefit from skilled PT to improve balance and functional mobility. Recommend d/c home with supervision and services.       Pt's functional mobility is  impacted by:  decreased balance, gait impairment, and decreased safety awareness.  There are a few comorbidities or other factors that affect plan of care and require modification of task including: vertigo/dizziness, has stairs to manage, and lives alone.  Standardized tests and exams incorporated into evaluation include AMPAC mobility, balance, and Strength.  Pt demonstrates a stable clinical presentation.   Pt would continue to benefit from PT to address these deficits and increase functional independence.     Complexity Level Hx and Co  morbidites Examination Clinical Decision Making Clinical Presentation   Moderate   1-2 factors 3 or more   Several options Evolving, plan may alter       PMP - Progressive Mobility Protocol   PMP Activity: Step 6 - Walks in Room  Distance Walked (ft) (Step 6,7): 20 Feet       Rehabilitation Potential: Good       Interdisciplinary Communication: RN      Plan     Plan  Risks/Benefits/POC Discussed with Pt/Family: With patient  Treatment/Interventions: Exercise;Gait training;Stair training;Neuromuscular re-education;Functional transfer training;LE strengthening/ROM;Patient/family training;Equipment eval/education;Bed mobility  PT Frequency: 2-3x/wk         Medical Diagnosis: Hypertensive emergency [I16.1]    History of Present Illness: Emily [name] is a 78 y.o. female admitted on  02/04/2024 with "Past medical history of breast cancer, hypertension, hyperlipidemia, GERD, came in today with dizziness/vomiting, found to  be hypertensive to 220/90, admitted with hypertensive emergency " per MD note      Problem List[1]  Medical History[2]  Past Surgical History[3]    Tests/Labs:  Lab Results   Component Value Date/Time    HGB 11.1 (L) 02/06/2024 06:32 AM    HGB 11.1 (L) 01/30/2023 03:56 PM    HCT 35.6 02/06/2024 06:32 AM    HCT 36.1 01/30/2023 03:56 PM    K 4.6 02/06/2024 06:32 AM    K 4.1 01/30/2023 03:56 PM    NA 141 02/06/2024 06:32 AM    NA 139 01/30/2023 03:56 PM    INR 1.0  11/25/2023 08:00 AM    INR 1.0 01/30/2023 03:56 PM    TROPI <2.7 02/04/2024 11:03 AM    TROPI <2.7 11/25/2023 08:00 AM    TROPI 3.7 08/06/2023 09:38 AM    TROPI <2.7 01/30/2023 03:56 PM    TROPI <2.7 11/21/2022 12:16 PM    TROPI <2.7 10/30/2022 03:02 PM    TROPI Unable toCalc. 10/30/2022 03:02 PM    TROPI <2.7 10/30/2022 12:36 PM    TROPI 0.01 07/15/2009 09:40 AM         Imaging   US  Venous Duplex Doppler Leg Right  Result Date: 02/06/2024   No evidence of right lower extremity DVT. Maximiano Spanish, MD 02/06/2024 9:25 AM    MRI Brain WO Contrast  Result Date: 02/04/2024   1.No acute intracranial abnormality. Specifically, no acute infarct. 2.Mild chronic small vessel ischemic disease and generalized cerebral volume loss. Milan Tony Frederickson, MD 02/04/2024 2:59 PM    CT Head WO Contrast  Result Date: 02/04/2024   1.Stable partially empty sella. 2.No intracranial hemorrhage or mass is seen. 3.There is been no change since the prior study of December 23, 2023. Mervin Abu, MD 02/04/2024 12:46 PM    CT Angio Chest (PE study)  Result Date: 02/04/2024  1.No pulmonary embolism. 2.Cardiomegaly. 3.Mild atelectasis. Marc Senior, MD 02/04/2024 12:17 PM    XR Chest  AP Portable  Result Date: 02/04/2024  Moderate pulmonary edema. Regino Caprio, MD 02/04/2024 11:37 AM      Social History: (Per family/care provider, if patient is not able to give accurate report)  Lives alone in an apartment.  Entry Steps: 1 FOS   Rails: Y Inside steps: 0   Equipment at home:  No equipment needs and walk in shower  Prior Level of Function:  Walks most places, takes taxi to appointments. Cleans, cooks, active   Cognition: intact    Mobility/Locomotion: Independent   Feeding: Independent   Grooming: Independent   Bathing: Independent   Dressing: Independent   Toileting: Independent    Subjective   "I feel terrible"  Patient is agreeable to participation in the therapy session. Nursing clears patient for therapy.  Patient's Goal:  To feel better  Pain: pt  denies pain    Objective     Precautions/ Contraindications:   Precautions  Weight Bearing Status: no restrictions  Other Precautions: fall risk    Patient is out of bed, ambulating with telemetry and intravenous access in place.       Observation of patient/vitals:        Vitals:    02/05/24 1951 02/06/24 0014 02/06/24 0433 02/06/24 0754   BP: 113/60 146/68 127/68 134/70   Pulse: 74 64 61    Resp: 17 17 17 15    Temp: 99 F (37.2 C) 97.7 F (36.5 C) 98.4 F (36.9 C) 98.6 F (37 C)  TempSrc: Oral Oral Oral Oral   SpO2: 95% 95% 96% 97%   Weight:       Height:           Orientation/Cognition:  Alert and Oriented x 4  Cognition: intact      Musculoskeletal Examination:      ROM Strength   RLE WFL WFL   LLE WFL WFL     Sensation: intact to light touch  Coordination: intact gross motor    Functional Mobility:  Rolling: independent    Scooting: SBA  Sit to Supine: SBA  Sit to stand: CGA  Stand to sit: CGA  Ambulation:     Weightbearing: no restrictions   Assistance level: CG-minA   Distance: 20 feet in room   Assistive Device: none   Gait Deviations: slow cadence, occasional LOB with need for increased assist    Balance:  Static Sit: good  Dynamic Sit: good-  Static Stand: fair  Dynamic Stand: poor    Endurance: good    Participation:  good    Education:  Educated the patient to role of physical therapy, plan of care, goals  of therapy, rationale for progressing mobility and safety with mobility and ADLs.    RN notified of session outcome and that patient was left in bed with all needs met and equipment intact.   Safety measures include: handoff to nurse/clin tech/ unit secretary completed, oriented to call bell and placed within reach, and personal items within reach.   Mobility and ADL status posted at bedside and within E.M.R.    AM-PACT Inpatient Short Forms  Inpatient AM-PACT Performed? (PT): Basic Mobility Inpatient Short Form   AM-PACT "6 Clicks" Basic Mobility Inpatient Short Form  Turning Over in Bed:  None  Sitting Down On/Standing From Armchair: A little  Lying on Back to Sitting on Side of Bed: None  Assist Moving to/from Bed to Chair: A little  Assist to Walk in Hospital Room: A little  Assist to Climb 3-5 Steps with Railing: A little  PT Basic Mobility Raw Score: 20  CMS 0-100% Score: 35.83%      Goals  Goal Formulation: With patient  Time for Goal Acheivement: 5 visits  Pt Will Go Supine To Sit: independent  Pt Will Perform Sit To Supine: independent  Pt Will Perform Sit to Stand: independent  Pt Will Ambulate: 151-200 feet;with rolling walker;modified independent  Pt Will Go Up / Down Stairs: 1 flight;with supervision                  Therapist PPE during session procedural mask and gown     Signature:  Rometta Coad, PT  02/06/2024  10:21 AM       (For scheduling questions, please contact rehab tech x [phone])    Attention MD:   Thank you for allowing us  to participate in the care of [facility]. Regulations from the [employer] for Medicare and Medicaid Services (CMS) require your review and approval of this plan of care.     Please cosign this note indicating you are in agreement with the Therapy Plan of Care so we may initiate the therapy treatment plan.           [1]   Patient Active Problem List  Diagnosis    Malignant neoplasm of overlapping sites of right female breast (CMS/HCC)    Hypertension    Mixed hyperlipidemia    Hypothyroid    Left-sided weakness    Right  sided numbness    COVID-19 ruled out by laboratory testing    Abnormal CT scan, gastrointestinal tract    History of hypothyroidism    History of breast cancer    History of colonic polyps    Dizziness    Left sided numbness    Hypertensive emergency   [2]   Past Medical History:  Diagnosis Date    Breast cancer (CMS/HCC) 2014    right breast mastectomy    Exercise-induced angina     negative work up    GERD (gastroesophageal reflux disease)     Headache     resolved    Hyperlipidemia     Hypertension     Hypothyroidism      Low back pain     Malignant neoplasm of overlapping sites of right female breast (CMS/HCC) 10/19/2015    Syncope and collapse     Chronic Vertigo    Vertigo    [3]   Past Surgical History:  Procedure Laterality Date    COLONOSCOPY, DIAGNOSTIC (SCREENING)  2021    COLONOSCOPY, REMOVAL OF TUMOR, POLYP, OR OTHER LESION BY SNARE TECHNIQUE N/A 07/21/2020    Procedure: COLONOSCOPY, POLYPECTOMY;  Surgeon: Ebbie Goldmann, MD;  Location: [facility];  Service: Gastroenterology;  Laterality: N/A;    EGD, BIOPSY N/A 07/21/2020    Procedure: EGD, BIOPSY;  Surgeon: Ebbie Goldmann, MD;  Location: [facility];  Service: Gastroenterology;  Laterality: N/A;    EXPLORATORY LAPAROTOMY N/A 05/22/2021    Procedure: EXPLORATORY LAPAROTOMY;  Surgeon: Levie Ream, MD;  Location: [facility];  Service: General;  Laterality: N/A;    HYSTERECTOMY      LAPAROSCOPY, DIAGNOSTIC N/A 05/22/2021    Procedure: LAPAROSCOPY, DIAGNOSTIC;  Surgeon: Levie Ream, MD;  Location: [facility];  Service: General;  Laterality: N/A;    LAPAROTOMY, COLECTOMY, RIGHT Right 05/22/2021    Procedure: LAPAROTOMY, COLECTOMY, RIGHT;  Surgeon: Levie Ream, MD;  Location: [facility];  Service: General;  Laterality: Right;    MASTECTOMY Right 2015

## 2024-02-06 NOTE — Progress Notes (Signed)
 [facility]  Internal Medicine Hospitalists  Progress Note        Assessment / Plan:        Emily [name] is a 78 y.o. female with Past medical history of breast cancer, hypertension, hyperlipidemia, GERD, came in today with dizziness/vomiting, found to be hypertensive to 220/90, admitted with hypertensive emergency     #Hypertensive emergency  -Likely secondary to noncompliance due to finances  Initially hypertensive to 220/90, improved with labetalol   D-dimer elevated, CT chest negative for PE showing cardiomegaly and mild atelectasis  Trop/BNP normal  - Achieved 25% reduction in BP in first 24 hours, continue amlodipine  10 mg QD and lisinopril  40 mg QD for normotensive goals   - TTE with normal EF.    # Dizziness, ambulatory dysfunction  PT/OT recommend home with home health and FWW       #Right arm numbness and tingling  Pt states its related to shoulder surgery a few years back   CT head negative, MRI negative   Lipid panel, TSH, A1c unremarkable      #History of breast cancer, continue previous meds     #Hyperlipidemia, continue statin     #GERD, continue Protonix     #Social issues  Patient states that she has not been compliant with her medications taking them every other day for the past several weeks due to financial burdens  Patient lives alone at home.  Her children are in [place] , [place]  [place] .  Case manager consulted          VTE Prophylaxis: enoxaparin  (LOVENOX ) syringe 40 mg  Place sequential compression device     Foley Catheter: No Foley Present    Venous Access: No Temporary Central Line Present    Medical Readiness for Discharge: Anticipated Tomorrow    Open Handoff Activity in Sidebar    Additional Diagnoses:               Subjective:      Pt doing okay today. Denies any CP or SOB. Continues to endorse dizziness and trouble walking but feels better today         Objective:      Temp:  [97.7 F (36.5 C)-99 F (37.2 C)] 98.2 F (36.8 C)  Heart Rate:   [53-74] 53  Resp Rate:  [14-17] 14  BP: (113-164)/(60-85) 139/76   General: awake, alert, oriented; no acute distress  HEENT: anicteric sclerae,, EOMI; MMM  Cardiovascular: regular rate and rhythm; no murmurs, rubs, or gallops  Lungs: clear to auscultation bilaterally without wheezing, rhonchi, or rales  Abdomen: soft, non-distended; non-tender to palpation, no rebound or guarding  Extremities: warm; no LE edema; No upper ext edema noted.   Neuro: symmetric facial movements, clear speech, moving all extremities

## 2024-02-06 NOTE — Progress Notes (Signed)
 CM Update: CM requested SCM charity (for 1SN, 2PT and front wheel walker) via secure chat with SM Director and awaiting response.  Jacky Massing, MSW   Case Management  [facility]  [phone]

## 2024-02-06 NOTE — Progress Notes (Addendum)
 NOTE:    CM met patient at her bedside. She presented A&Ox3. We discussed her PT recommendations and she agreed to [facility] SN PT OT and Front wheel walker.   Jacky Massing, MSW   Case Management  [facility]  [phone]

## 2024-02-06 NOTE — Consults (Addendum)
 Start [facility] Note  Home Health Referral    Referral from Ira E. (Case Manager) for home health care upon discharge.    By [employer], Emily patient has Emily right to freely choose a home care provider.    A company of Emily patients choosing. We have supplied Emily patient with a listing of providers in your area who asked to be included and participate in Medicare.   [facility] a home care agency that provides adult home care services and participates in Medicare   Emily preferred provider of your insurance company. Choosing a home care provider other than your insurance company's preferred provider may affect your insurance coverage.      Home Health Discharge Information    Your doctor has ordered Skilled Nursing, Physical Therapy, and Occupational Therapy in-home service(s) for you while you recuperate at home, to assist you in Emily transition from hospital to home.    Emily agency that you or your representative chose to provide Emily service:        [facility]    [phone]      Emily above services were set up by:  Johnathan Myron, RN (Post Acute Care Coordinator)   Phone: [phone]      IF YOU HAVE NOT HEARD FROM YOUR HOME HEALTH AGENCY WITHIN 24-48 HOURS AFTER DISCHARGE PLEASE CALL YOUR AGENCY TO ARRANGE A TIME FOR YOUR FIRST VISIT. FOR ANY SCHEDULING CONCERNS OR QUESTIONS RELATED TO HOME HEALTH, SUCH AS TIME OR DATE PLEASE CONTACT YOUR HOME HEALTH AGENCY AT Emily NUMBER LISTED ABOVE.    Additional comments:        START PATIENT REGISTRATION INFORMATION     Order Information  Order Signing Physician: Juhardeen, Hamzah, MD    Service Ordered RN ?: Yes  Service Ordered PT ?: Yes  Service Ordered OT ?: Yes  Service Ordered ST ?: No    Service Ordered MSW?: No    Service Ordered HHA?: No    Following Physician: Obadiah Bellow, MD   Following Physician Phone: [phone]   Overseeing Physician: N/A  (Required for Residents only)   Agreeable to Follow?: N/A  Spoke with: N/A  Date/Time of Call:  02/06/24 4:35 PM      Care Coordination   [facility] Call from [facility] Required?: no  [facility]?: no  Primary Care Physician:Stephanie Shirlyn Dowdy, MD  Primary Care Physician [phone]  Primary Care Physician Address: [address] / [place] [address]  PCP NPI: [id number]  Visit Instructions: N/A  Service Discharge Location Type: Home  Service Facility Name: N/A  Service Floor Facility: N/A  Service Room No: N/A    Demographics  Patient Last Name: Emily [name]   Patient First Name: Emily [name]  Language/Communication Barrier: no  Service Address: [address]  [place] [address]   Service Home Phone: [phone] (home)   Other phone numbers:    Telephone Information:   Mobile [phone]     Emergency Contact: Extended Emergency Contact Information  Primary Emergency Contact: Emily [name],Emily [name]  Mobile Phone: [phone]  Relation: Son  Preferred language: English  Interpreter needed? No  Secondary Emergency Contact: Harris,Gloria  Mobile Phone: [phone]  Relation: Cousin    Admission Information  Admit Date: 02/04/2024  Patient Status at discharge: Inpatient  Admitting Diagnosis: Hypertensive emergency [I16.1]     Caregiver Information  Caregiver First Name: N/A  Caregiver Last Name: N/A  Caregiver Relationship to Patient: Other  Caregiver Phone Number: N/A  Caregiver Notes: N/A            Psychologist, counselling Subscriber:   Primary Subscriber Relation To Guarantor:   Primary Payor:   Primary Plan:   Primary Group #:    Primary Subscriber ID:    Primary Subscriber DOB:   Secondary Insurance Information  Secondary Subscriber:   Secondary Subscriber Relation To Guarantor:   Secondary Payor:   Secondary Plan:   Secondary Group #:   Secondary Subscriber ID:   Secondary Subscriber DOB:   HITECH  NO      END PATIENT REGISTRATION INFORMATION       Diagnosis: Hypertensive emergency [I16.1]    Start [facility] Summary        Additional Comments:     End [facility] Summary      Discharge Date: 5.6.2025    Referral Source  Signed by: Johnathan Myron, RN  Date Time: 02/06/24 4:35 PM      End [facility] Note             [facility]     Patient Name: Emily [name], Emily [name]     MRN: [id number]      CSN: [id number]         Account Information     Hosp Acct #   [id number] Patient Class   Observation Service  Medicine Accommodation Code  Intermediate Care      Admission Information     Admitting Physician:  Attending Physician: Bradd Cabot, DO  Juhardeen, Hamzah, MD Unit  [facility] [place] INTERMED* L&D Status      Admitting Diagnosis: Hypertensive emergency Room / Bed  [id number] L&D - Last Menstrual Cycle      Chief Complaint: Nausea; Dizziness; Hyper*     Admit Type:  Admit Date/Time:  Discharge Date/Time: Emergency  02/04/2024 / 1057   /  Length of Stay: 0 Days    L&D EDD   Estimated Date of Delivery: None noted.      Patient Information              Home Address: [address] [address] [address] Employer:  Employer Address:       ,     Main Phone: [phone] Employer Phone:     SSN: [id number]       DOB: [birth year] (77 yrs)       Sex: Female Primary Care Physician: Obadiah Bellow, MD   Marital Status: Widowed Referring Physician:       No ref. provider found   Race: Black or African American       Ethnicity: Not of Hispanic/Latino/S*       Emergency Contacts  Name Home Phone Work Phone Mobile Phone Relationship Emily [name]   Emily [name],Emily [name]     [phone] Son No   Garrie Kand     [phone] [facility]           Guarantor Information     Guarantor Name: Emily [name], Emily [name] Guarantor ID: [id number]   Guarantor Relationship to Pt: Self Guarantor Type: Personal/Family   Guarantor DOB:    11-Nov-1945 Billing Indicator: DECO No App Submitted  DECO Vendor Review      Guarantor Address: [address]   [place], [place] [address]          Guarantor Home Phone: [phone] Guarantor Employer:        Guarantor Work Phone:   Pensions consultant Emp Phone:  Primary Insurance     Insurance Name: MEDICARE Annamarie Barrier MEDICARE [employer] Subscriber Name: [facility]   Insurance Address:    [address] [address]  [place], [place]  [address] Subscriber DOB: [birth year]      Subscriber ID: [id number]   Insurance Phone:   Pt Relationship to Sub:   Self   Insurance ID:   Coverage Eff From: 11/04/2023   Group Name:   Preauthorization #:     Group #: [id number] Preauthorization Days:        Secondary Insurance     Insurance Name: - Subscriber Name:     Community education officer Address:       ,   Statistician DOB:        Subscriber ID:     Press photographer:   Pt Relationship to Sub:       Insurance ID:   Coverage Eff From:     Group Name:   Preauthorization #:     Group #:   Preauthorization Days:        [employer] Name: Scientist, clinical (histocompatibility and immunogenetics) Name:     Community education officer Address:       ,   Statistician DOB:        Subscriber ID:     Press photographer:   Pt Relationship to Sub:       Insurance ID:   Coverage Eff From:     Group Name:   Preauthorization #:     Group #:   Preauthorization Days:           02/06/2024 4:35 PM        Home Health face-to-face (FTF) Encounter (Order [id number])  Consult  Date: 02/06/2024 Department: [facility] Intermediate Care Ordering/Authorizing: Juhardeen, Hamzah, MD     Order Information    Order Date/Time Release Date/Time Start Date/Time End Date/Time   02/06/24 04:35 PM None 02/06/24 04:36 PM 02/06/24 04:36 PM     Order Details    Frequency Duration Priority Order Class   Once 1  occurrence Routine Hospital Performed     Standing Order Information    Remaining Occurrences Interval Last Released     0/1 Once 02/06/2024              Provider Information    Ordering User Ordering Provider Authorizing Provider   Davis, Florina Husbands, RN Delfina Feller, MD Delfina Feller, MD   Attending Provider(s) Admitting Provider PCP   Maryelizabeth Smolder, MD; Prentiss Brocks; Juhardeen, Hamzah, MD Bradd Cabot, DO Obadiah Bellow, MD     Verbal Order Info    Action Created on Order Mode  Entered by Responsible Provider Signed by Signed on   Ordering 02/06/24 1635 Telephone with Galvin Jules, RN Juhardeen, Hamzah, MD             Comments    Provider to follow: Obadiah Bellow, MD -- [phone]    Home nursing required for skilled assessment including cardiopulmonary assessment and dietary education for disease management, and medication instruction. Home PT required for gait and balance training, strengthening, mobility, fall prevention, and ADL training. Home OT required for program to improve ability to perform ADLs, restorative program to improve mobility and independence, recovery and maintenance skills, strategies to compensate for loss of functions.    Dx: Hypertensive emergency [I16.1]                Home Health face-to-face (FTF) Encounter: Patient Communication     Not Released  Not seen         Order Questions    Question Answer   Date I saw Emily patient face-to-face: 02/06/2024   Evidence this patient is homebound because: E.  Requires assistance of another person or assistive device to ambulate > 5 feet    I.  Restricted to home to decrease risk of infection    L.  Transfer/ambulation requires stand by assist and verbal cues to perform safely   Medical conditions that necessitate Home Health care: C.  Risk for complication/infection/pain requiring follow up and monitoring    D.  Chronic illness & risk for re-hospitalization due to unstable disease status    E.  Exacerbation of disease requiring follow up monitoring    G.  High risk/complex medications requiring instruction and management    H.  Multiple new medications requiring management and monitoring    F.  New diagnosis & treatment requiring follow up monitoring and management   Per clinical findings, following services are medically necessary: Skilled Nursing    OT    PT   Clinical findings that support Emily need for Skilled Nursing. SN will: C. Monitor for signs and symptoms of exacerbation of disease and management     D. Review medication reconciliation, manage and educate on use and side effects    G. Educate on new diagnosis, treatment & management to prevent re-hospitalization    H. Assess cardiopulmonary status and monitor for signs &symptoms of exacerbation    I.  Educate dietary and or fluid restrictions and weight management   Clinical findings that support Emily need for Physical Therapy. PT will A.  Evaluate and treat functional impairment and improve mobility    C.  Educate on weight bearing status, stair/gait training, balance & coordination    D.  Provide services to help restore function, mobility, and releive pain    F.  Perform home safety assessment & develop safe in home exercise program    E.  Educate on functional mobility; bed, chair, sit, stand and transfer activities    G.  Implement activities to improve stance time, cadence & step length    H.  Educate on Emily safe use of assistive device/ durable medical equipment    I.  Instruct on restorative activities to restore ability to perform ADL   OT will provide assistance with: Home program to improve ability to perform ADLs    Recovery and maintenance skills    Basic motor function and reasoning abilities    Strategies to compensate for loss of function    Restorative program to improve mobility and independence   Other (please specify) See comments                    Process Instructions    Please select Home Care Services medically necessary.    Based on Emily above findings, I certify that this patient is confined to Emily home and needs intermittent skilled nursing care, physical therapry and / or speech therapy or continues to need occupational therapy. Emily patient is under my care, and I have initiated Emily establishment of Emily plan of care. This patient will be followed by a physician who will periodically review Emily plan of care.     Collection Information            Consult Order Info    ID Description Priority Start Date Start Time   [id number] Home Health  face-to-face (FTF) Encounter Routine 02/06/2024  4:36  PM   Provider Specialty Referred to   ______________________________________ _____________________________________                         Verbal Order Info    Action Created on Order Mode Entered by Responsible Provider Signed by Signed on   Ordering 02/06/24 1635 Telephone with Galvin Jules, RN Juhardeen, Hamzah, MD             Patient Information    Patient Name  Sheerin, Keri Peat Legal Sex  Female DOB  04-25-1946       Reprint Order Requisition    Home Health face-to-face (FTF) Encounter (Order #[id number]) on 02/06/24       Additional Information    Associated Reports External References   Priority and Order Details [employer]

## 2024-02-07 LAB — COMPREHENSIVE METABOLIC PANEL
ALT: 15 U/L (ref <=55)
AST (SGOT): 26 U/L (ref <=41)
Albumin/Globulin Ratio: 1.2 (ref 0.9–2.2)
Albumin: 3.8 g/dL (ref 3.5–5.0)
Alkaline Phosphatase: 72 U/L (ref 37–117)
Anion Gap: 13 (ref 5.0–15.0)
BUN: 13 mg/dL (ref 7–21)
Bilirubin, Total: 0.3 mg/dL (ref 0.2–1.2)
CO2: 29 meq/L (ref 17–29)
Calcium: 9.3 mg/dL (ref 7.9–10.2)
Chloride: 101 meq/L (ref 99–111)
Creatinine: 1 mg/dL (ref 0.4–1.0)
GFR: 57 mL/min/{1.73_m2} — ABNORMAL LOW (ref >=60.0)
Globulin: 3.1 g/dL (ref 2.0–3.6)
Glucose: 104 mg/dL — ABNORMAL HIGH (ref 70–100)
Potassium: 4.4 meq/L (ref 3.5–5.3)
Protein, Total: 6.9 g/dL (ref 6.0–8.3)
Sodium: 143 meq/L (ref 135–145)

## 2024-02-07 LAB — LAB USE ONLY - CBC WITH DIFFERENTIAL
Absolute Basophils: 0.04 10*3/uL (ref 0.00–0.08)
Absolute Eosinophils: 0.04 10*3/uL (ref 0.00–0.44)
Absolute Immature Granulocytes: 0.02 10*3/uL (ref 0.00–0.07)
Absolute Lymphocytes: 1.9 10*3/uL (ref 0.42–3.22)
Absolute Monocytes: 0.69 10*3/uL (ref 0.21–0.85)
Absolute Neutrophils: 4.08 10*3/uL (ref 1.10–6.33)
Absolute nRBC: 0 10*3/uL (ref <=0.00)
Basophils %: 0.6 %
Eosinophils %: 0.6 %
Hematocrit: 40.2 % (ref 34.7–43.7)
Hemoglobin: 12.2 g/dL (ref 11.4–14.8)
Immature Granulocytes %: 0.3 %
Lymphocytes %: 28.1 %
MCH: 26.5 pg (ref 25.1–33.5)
MCHC: 30.3 g/dL — ABNORMAL LOW (ref 31.5–35.8)
MCV: 87.4 fL (ref 78.0–96.0)
MPV: 11.7 fL (ref 8.9–12.5)
Monocytes %: 10.2 %
Neutrophils %: 60.2 %
Platelet Count: 285 10*3/uL (ref 142–346)
Preliminary Absolute Neutrophil Count: 4.08 10*3/uL (ref 1.10–6.33)
RBC: 4.6 10*6/uL (ref 3.90–5.10)
RDW: 14 % (ref 11–15)
WBC: 6.77 10*3/uL (ref 3.10–9.50)
nRBC %: 0 /100{WBCs} (ref <=0.0)

## 2024-02-07 LAB — MAGNESIUM: Magnesium: 2.3 mg/dL (ref 1.6–2.6)

## 2024-02-07 LAB — PHOSPHORUS: Phosphorus: 4.2 mg/dL (ref 2.3–4.7)

## 2024-02-07 NOTE — Plan of Care (Signed)
 CARE PLAN:  Problem: Moderate/High Fall Risk Score >5  Goal: Patient will remain free of falls  Outcome: Progressing  Flowsheets (Taken 02/07/2024 2100)  High (Greater than 13):   MOD-Remain with patient during toileting   MOD-Consider a move closer to Nurses Station   MOD-Use of chair-pad alarm when appropriate   MOD-Use of assistive devices -Bedside Commode if appropriate   MOD-Request PT/OT consult order for patients with gait/mobility impairment   MOD-Perform dangle, stand, walk (DSW) prior to mobilization   MOD-Include family in multidisciplinary POC discussions   MOD-Place Fall Risk level on whiteboard in room     Problem: Pain interferes with ability to perform ADL  Goal: Pain at adequate level as identified by patient  Outcome: Progressing     Problem: Side Effects from Pain Analgesia  Goal: Patient will experience minimal side effects of analgesic therapy  Outcome: Progressing  Flowsheets (Taken 02/07/2024 2100)  Patient will experience minimal side effects of analgesic therapy:   Monitor/assess patient's respiratory status (RR depth, effort, breath sounds)   Assess for changes in cognitive function   Prevent/manage side effects per LIP orders (i.e. nausea, vomiting, pruritus, constipation, urinary retention, etc.)   Evaluate for opioid-induced sedation with appropriate assessment tool (i.e. POSS)     Problem: Safety  Goal: Patient will be free from injury during hospitalization  Outcome: Progressing  Flowsheets (Taken 02/07/2024 2100)  Patient will be free from injury during hospitalization:   Assess patient's risk for falls and implement fall prevention plan of care per policy   Provide and maintain safe environment   Use appropriate transfer methods   Ensure appropriate safety devices are available at the bedside   Include patient/ family/ care giver in decisions related to safety   Hourly rounding   Assess for patients risk for elopement and implement Elopement Risk Plan per policy   Provide alternative  method of communication if needed (communication boards, writing)  Goal: Patient will be free from infection during hospitalization  Outcome: Progressing  Flowsheets (Taken 02/07/2024 2100)  Free from Infection during hospitalization:   Assess and monitor for signs and symptoms of infection   Monitor lab/diagnostic results   Monitor all insertion sites (i.e. indwelling lines, tubes, urinary catheters, and drains)   Encourage patient and family to use good hand hygiene technique     Problem: Pain  Goal: Pain at adequate level as identified by patient  Outcome: Progressing     Problem: Hemodynamic Status: Cardiac  Goal: Stable vital signs and fluid balance  Outcome: Progressing  Flowsheets (Taken 02/07/2024 2100)  Stable vital signs and fluid balance:   Assess signs and symptoms associated with cardiac rhythm changes   Monitor lab values     Problem: Inadequate Tissue Perfusion  Goal: Adequate tissue perfusion will be maintained  Outcome: Progressing  Flowsheets (Taken 02/07/2024 2100)  Adequate tissue perfusion will be maintained:   Monitor/assess lab values and report abnormal values   Monitor/assess neurovascular status (pulses, capillary refill, pain, paresthesia, paralysis, presence of edema)   Monitor for signs and symptoms of a pulmonary embolism (dyspnea, tachypnea, tachycardia, confusion)   Monitor/assess for signs of VTE (edema of calf/thigh redness, pain)   Encourage/assist patient as needed to turn, cough, and perform deep breathing every 2 hours     Problem: Ineffective Gas Exchange  Goal: Effective breathing pattern  Outcome: Progressing  Flowsheets (Taken 02/07/2024 2100)  Effective breathing pattern:   Teach/reinforce use of ordered respiratory interventions (ie. CPAP, BiPAP, Incentive Spirometer, Acapella)  Maintain CO2 level per LIP order   Monitor end tidal CO2 level per LIP order     Problem: Compromised Sensory Perception  Goal: Sensory Perception Interventions  Outcome: Progressing  Flowsheets (Taken  02/07/2024 2100)  Sensory Perception Interventions: Offload heels, Pad bony prominences, Reposition q 2hrs/turn Clock, Q2 hour skin assessment under devices if present     Problem: Compromised Moisture  Goal: Moisture level Interventions  Outcome: Progressing  Flowsheets (Taken 02/07/2024 2100)  Moisture level Interventions: Moisture wicking products, Moisture barrier cream     Problem: Compromised Activity/Mobility  Goal: Activity/Mobility Interventions  Outcome: Progressing  Flowsheets (Taken 02/07/2024 2100)  Activity/Mobility Interventions: Pad bony prominences, TAP Seated positioning system when OOB, Promote PMP, Reposition q 2 hrs / turn clock, Offload heels     Problem: Compromised Nutrition  Goal: Nutrition Interventions  Outcome: Progressing  Flowsheets (Taken 02/07/2024 2100)  Nutrition Interventions: Discuss nutrition at Rounds, I&Os, Document % meal eaten, Daily weights

## 2024-02-07 NOTE — Plan of Care (Signed)
 Problem: Moderate/High Fall Risk Score >5  Goal: Patient will remain free of falls  Outcome: Progressing  Flowsheets (Taken 02/07/2024 1408)  High (Greater than 13):   HIGH-Pharmacy to initiate evaluation and intervention per protocol   HIGH-Apply yellow Fall Risk arm band  VH Moderate Risk (6-13):   Use assistive devices   Remain with patient during toileting  VH High Risk (Greater than 13): Use assistive devices     Problem: Pain interferes with ability to perform ADL  Goal: Pain at adequate level as identified by patient  Outcome: Progressing  Flowsheets (Taken 02/07/2024 1408)  Pain at adequate level as identified by patient:   Identify patient comfort function goal   Offer non-pharmacological pain management interventions   Consult/collaborate with Physical Therapy, Occupational Therapy, and/or Speech Therapy   Include patient/patient care companion in decisions related to pain management as needed   Evaluate if patient comfort function goal is met   Reassess pain within 30-60 minutes of any procedure/intervention, per Pain Assessment, Intervention, Reassessment (AIR) Cycle   Assess pain on admission, during daily assessment and/or before any as needed intervention(s)     Problem: Side Effects from Pain Analgesia  Goal: Patient will experience minimal side effects of analgesic therapy  Outcome: Progressing     Problem: Safety  Goal: Patient will be free from injury during hospitalization  Outcome: Progressing  Flowsheets (Taken 02/07/2024 1408)  Patient will be free from injury during hospitalization:   Assess patient's risk for falls and implement fall prevention plan of care per policy   Provide and maintain safe environment   Ensure appropriate safety devices are available at the bedside  Goal: Patient will be free from infection during hospitalization  Outcome: Progressing  Flowsheets (Taken 02/07/2024 1408)  Free from Infection during hospitalization:   Assess and monitor for signs and symptoms of  infection   Monitor lab/diagnostic results   Monitor all insertion sites (i.e. indwelling lines, tubes, urinary catheters, and drains)   Encourage patient and family to use good hand hygiene technique     Problem: Pain  Goal: Pain at adequate level as identified by patient  Outcome: Progressing  Flowsheets (Taken 02/07/2024 1408)  Pain at adequate level as identified by patient:   Identify patient comfort function goal   Offer non-pharmacological pain management interventions   Consult/collaborate with Physical Therapy, Occupational Therapy, and/or Speech Therapy   Include patient/patient care companion in decisions related to pain management as needed   Evaluate if patient comfort function goal is met   Reassess pain within 30-60 minutes of any procedure/intervention, per Pain Assessment, Intervention, Reassessment (AIR) Cycle   Assess pain on admission, during daily assessment and/or before any as needed intervention(s)     Problem: Hemodynamic Status: Cardiac  Goal: Stable vital signs and fluid balance  Outcome: Progressing  Flowsheets (Taken 02/06/2024 0253 by Eugenie Lade A, RN)  Stable vital signs and fluid balance:   Assess signs and symptoms associated with cardiac rhythm changes   Monitor lab values     Problem: Inadequate Tissue Perfusion  Goal: Adequate tissue perfusion will be maintained  Outcome: Progressing     Problem: Ineffective Gas Exchange  Goal: Effective breathing pattern  Outcome: Progressing

## 2024-02-07 NOTE — PT Progress Note (Addendum)
 Physical Therapy Treatment  Ronal Cutter Dillehay  Post Acute Care Therapy Recommendations:     Discharge Recommendations:  Home with supervision, Home with home health PT    If Home with supervision, Home with home health PT  recommended discharge disposition is not available, patient will need CGA<>supervision assist for all mobility and likely transition to ALF.     DME needs IF patient is discharging home: Front wheel walker    Therapy discharge recommendations may change with patient status.  Please refer to most recent note for up-to-date recommendations.           [facility]  [address]  [place] [address]  [phone]    Physical Therapy Treatment    Patient:  Emily [name]        MRN#:  [id number]  Unit:  [facility]        Room/Bed:  FP366/FP366-97    Medical Diagnosis: Hypertensive emergency [I16.1]    Time of treatment:  PT Received On: 02/07/24  Start Time: 1442   Stop Time: 1509  Time Calculation (min): 27 min  Total Treatment Time (min): 27    Treatment #: PT Visit Number: 1/5    Patient's medical condition is appropriate for Physical Therapy intervention at this time.    Interpreter utilized: no, not indicated    Assessment   Pt seen for PT follow up this date. She was sitting in chair on approach. She was agreeable to activity with encouragement, and declined use of RW throughout session. She ambulated around unit with SBA for safety progressing to supervision, and was able to negotiate 10 steps with use of single and B rails intermittently. She will benefit from skilled PT services to address ongoing deficits. Discharge recommendation is home with supervision and home PT; pt stated she may be going to stay with her son in [place] for a while after discharge. Noted that pt was inconsistent with report of PLOF and home setup, and was not able to identify appropriate number to call should an emergency occur while at home. For long term plan, consider living  situation with increased supervision/assistance, such as assisted living.     PMP - Progressive Mobility Protocol   PMP Activity: Step 7 - Walks out of Room  Distance Walked (ft) (Step 6,7): 180 Feet     Plan     Continue plan of care.    Plan  Risks/Benefits/POC Discussed with Pt/Family: With patient  Treatment/Interventions: Exercise;Gait training;Stair training;Neuromuscular re-education;Functional transfer training;LE strengthening/ROM;Patient/family training;Equipment eval/education;Bed mobility  PT Frequency: 2-3x/wk    Interdisciplinary Communication: OT, RN      Subjective   I am not old enough to use a walker  Patient is agreeable to participation in the therapy session. Nursing clears patient for therapy.  Pain: pt denies pain  Location: n/a  Therapist Intervention: positioned for comfort  Patient is satisfied with therapist intervention.    Asked pt to identify who she would call if an emergency were to occur while she is home alone. She statedmy son, or I have neighbors. When writer suggested calling emergency services, she stated I hope an emergency doesn't happen, but I have neighbors. They'll tell me if something is happening.  She did not identify number for emergency services when asked.     Inconsistent report of home set up; at times reporting that there are two entrances to her apartment, one without stairs (level entry, side door) and one with 5 stairs (  report of if they are up/down from building entry varies). However, she then also reports that there are no stairs at all.     Oriented x3, unable to state correct date    Vitals:   Vitals:    02/07/24 1257 02/07/24 1353 02/07/24 1357 02/07/24 1359   BP: 116/54 128/83 142/53 122/72   Pulse: 77 71 92 (!) 102   Resp: 18      Temp: 98.2 F (36.8 C)      TempSrc:       SpO2: 98%      Weight:       Height:           Objective     Precautions/ Contraindications:  Precautions  Weight Bearing Status: no restrictions  Other Precautions: fall  risk    Patient is seated in a bedside chair with telemetry and intravenous access in place.      Functional Mobility:  Sit to Stand: SBA  Stand to Sit: SBA    Gait:   WB status: no restrictions  Assistive Device: none; pt refusing RW despite encouragement, stating I am not old enough for a walker yet  Assist Level: SBA>supervision  Distance: 177ft  Pattern: decreased foot clearance, decreased knee flexion in swing phase, increased lateral sway. No overt LOB or buckling. Intermittently reaching for furniture in environment; pt states this is only because it is there and that she does not want to trial an assistive device  Stairs/Curbs: SBA   Number of steps: 10  Wheelchair management:     Educated the patient to role of physical therapy, plan of care, goals  of therapy, rationale for progressing mobility and safety with mobility and ADLs and home safety. Extensive discussion with pt about the benefits of using assistive device to improve safety.     RN notified of session outcome and that patient was left in chair with all needs met and equipment intact.   Safety measures include: handoff to nurse/clin tech/ unit secretary completed, chair alarm activated, oriented to call bell and placed within reach, personal items within reach, and assistive device positioned out of reach.   Mobility and ADL status posted at bedside and within E.M.R.    Goals per Eval/ Re-eval:   Goals  Goal Formulation: With patient  Time for Goal Acheivement: 5 visits  Pt Will Go Supine To Sit: independent  Pt Will Perform Sit To Supine: independent  Pt Will Perform Sit to Stand: independent  Pt Will Ambulate: 151-200 feet, with rolling walker, modified independent  Pt Will Go Up / Down Stairs: 1 flight, with supervision        Therapist PPE during session procedural mask and gloves     Signature:  Lauraine Downy, PT, DPT  02/07/2024 3:21 PM        (For scheduling questions, please contact rehab tech x [phone])

## 2024-02-07 NOTE — Progress Notes (Addendum)
 [facility]  Internal Medicine Hospitalists  Progress Note        Assessment / Plan:      78 y.o. female with past medical history of breast cancer, hypertension, hyperlipidemia, GERD, came in today with dizziness/vomiting, found to be hypertensive to 220/90, admitted with hypertensive emergency     #Hypertensive urgency  -Likely secondary to noncompliance due to finances  -Initially hypertensive to 220/90, improved with labetalol   -D-dimer elevated, CT chest negative for PE showing cardiomegaly and mild atelectasis  - TTE 02/06/2024 with normal EF of 70%  -Follow BP on amlodipine  10 mg daily, lisinopril  40 mg daily     # Dizziness, ambulatory dysfunction  -PT/OT recommend home with home health and FWW   -Noted this morning with SBP decreasing into 70s with sitting although with standing SBP 105   - Continue to follow BP on above regimen and with therapy     #Right arm numbness and tingling  -Pt states its related to shoulder surgery a few years back   -CT head negative, MRI negative   -On 5/4 TSH 0.66, A1c 6.0 unremarkable      #History of breast cancer  - Continue Arimidex      #Hyperlipidemia  -continue statin     #GERD  -continue Protonix      #Social issues  -Patient states that she has not been compliant with her medications taking them every other day for the past several weeks due to financial burdens  -Patient lives alone at home.  Her children are in [place] , [place]  [place] .  -Case manager evaluation for discharge needs  - Discussed with son/Theo and he anticipates coming on Saturday, 5/10 to visit mother and would bring her back to live with him for the next month if she is agreeable.  He reports that he attempted this 2 weeks ago and she was not agreeable to return with him at that time.     VTE Prophylaxis: enoxaparin  (LOVENOX ) syringe 40 mg  Place sequential compression device     Foley Catheter: No Foley Present    Venous Access: No Temporary Central Line  Present    Medical Readiness for Discharge: possibly 1 day        Open Handoff Activity in Sidebar  Subjective:      Pt seen and examined.  Patient reports some mild intermittent dizziness which has been going on for several months.  Patient denies any shortness of breath.       Objective:      Temp:  [97.5 F (36.4 C)-98.6 F (37 C)] 98.2 F (36.8 C)  Heart Rate:  [51-102] 102  Resp Rate:  [14-18] 18  BP: (74-168)/(48-83) 122/72   General: Alert and awake, no distress  CVS: Normal S1 and S2, no gallop or rub  Lungs: Clear to auscultation bilaterally, no rhonchi or wheezing  Abdomen: Soft, nontender  Extremities: No pedal edema, no cyanosis       Labs/Radiology:   Imaging personally reviewed, including: all available   No results found.  No results for input(s): GLUCOSEWB in the last 24 hours.  Recent Labs   Lab 02/07/24  0557 02/06/24  0632 02/05/24  0653 02/04/24  1103   Sodium 143 141 142 141   Potassium 4.4 4.6 4.6 4.1   Chloride 101 105 107 108   BUN 13 15 12 16    Creatinine 1.0 0.9 0.9 0.9   GFR 57.0* >60.0 >60.0 >60.0   Glucose 104* 97 102* 149*  Calcium  9.3 9.2 8.9 8.6     Recent Labs   Lab 02/07/24  0557 02/06/24  0632 02/05/24  0653 02/04/24  1103   WBC 6.77 5.97 7.31 5.55   Hemoglobin 12.2 11.1* 11.6 11.1*   Hematocrit 40.2 35.6 37.3 36.6   Platelet Count 285 285 275 269         Recent Labs   Lab 02/07/24  0557 02/06/24  0632 02/05/24  0653 02/04/24  1103   Alkaline Phosphatase 72 70 78 90   Bilirubin, Total 0.3 0.3 0.2 0.2   ALT 15 10 15 12    AST (SGOT) 26 22 23 26      Recent Labs   Lab 02/04/24  1124   D-Dimer 5.42*         [place] Pager: [phone]  Signed by: Dorn CHRISTELLA Kubas, MD

## 2024-02-07 NOTE — Progress Notes (Signed)
 CARDIOLOGY HOSPITAL NOTE  Algernon Prier, [place]   [facility] Cardiology  Office phone:  [phone]     [facility] phone [phone]  [facility] phone [phone]    Date:  02/07/2024  9:46 AM    Patient:  Emily [name].  DOB: 1946-06-25.  78 y.o.  female , MRN: [id number]   Attending:  Elvera Dorn HERO, MD   Admission date:  02/04/2024       PHYSICIAN ATTESTATION     I personally spoke to the patient, performed a physical exam, and reviewed pertinent labs/radiology/diagnostic studies.  The medical decision making was formulated by me as documented below.  I agree with the note written by the APP Henrene Prier) after review and edited with caveats when appropriate.    Assessment and Plan:  78 yo patient presents with N/V -          1) hypertensive urgency.  Noncompliance due to finances.  BP improved 127-164 systolic.  BP low this morning and will d/c HCTZ and Continue amlodipine  and lisinopril .  Can be discharged to home .  -          2) hyperlipidemia .  Continue crestor .  D/c ECASA        Maythe Deramo Fleeta Rockers, MD, [facility]       ASSESSMENT & PLAN:     78 y.o.female with hypertension, hyperlipidemia, breast cancer who presented on 02/04/24 with nausea and vomiting and was found to be hypertensive with BP 220/90.     Hypertensive urgency, primary diagnosis   - echo on 02/06/24 demonstrated normal LV global systolic function with EF 65-70%   - improved blood pressure control, SBP 124-152  - continue amlodipine  10 mg QD, Lisinopril  40 mg QD  - hold HCTZ     Hyperlipidemia  - continue rosuvastatin  40 mg QD     INTERVAL HISTORY:     5/4: admitted for hypertensive emergency   5/6: echo with normal LV systolic function with EF 65-70%, HCTZ added  5/7: hypotensive with BP 74/48, hold HCTZ     MEDICATIONS:    INFUSION MEDS:      SCHEDULED MEDS:     Current Facility-Administered Medications   Medication Dose Route Frequency    amLODIPine   10 mg Oral Daily    anastrozole   1 mg Oral QAM    enoxaparin   40 mg  Subcutaneous Daily    furosemide   40 mg Intravenous Once    hydroCHLOROthiazide   12.5 mg Oral Daily    lisinopril   40 mg Oral Daily    pantoprazole   40 mg Oral QAM AC    rosuvastatin   40 mg Oral Daily      PRN MEDS:   acetaminophen , benzocaine -menthol , benzonatate , carboxymethylcellulose sodium, dextrose  **OR** dextrose  **OR** dextrose  **OR** glucagon  (rDNA), hydrALAZINE , labetalol , magnesium  sulfate, meclizine , melatonin, naloxone , ondansetron , potassium & sodium phosphates , potassium chloride  **OR** potassium chloride  **OR** potassium chloride , saline    DIAGNOSTICS:       02/07/24: Telemetry  Sinus bradycardia at 59 BPM     02/06/24: TTE  Summary    * Limited study for LVEF assessment.    * Left ventricular global systolic function is normal.  Calculated LV  ejection fraction is  70 % by the Simpsons biplane method and 65-70% visually.    * Compared to the prior study dated 01/28/2020, there has been no significant  change.      02/04/24: ECG  Sinus nradycardia at 59 BPM  01/28/20: TTE  * Left ventricular ejection fraction is normal with an estimated ejection  fraction of 60-65%.    * Left ventricular wall thickness is mildly increased.    * No significant valvular dysfunction.     04/18/19: Cardiac stress test  Negative for ischemia    LABS:   Labs reviewed:  Recent Labs   Lab 02/07/24  0557 02/06/24  0632 02/05/24  0653   WBC 6.77 5.97 7.31   Hemoglobin 12.2 11.1* 11.6   Hematocrit 40.2 35.6 37.3   Platelet Count 285 285 275     Recent Labs   Lab 02/07/24  0557 02/06/24  0632 02/05/24  0653   Sodium 143 141 142   Potassium 4.4 4.6 4.6   Chloride 101 105 107   CO2 29 31* 26   BUN 13 15 12    Creatinine 1.0 0.9 0.9   GFR 57.0* >60.0 >60.0   Glucose 104* 97 102*   Calcium  9.3 9.2 8.9     Recent Labs   Lab 02/04/24  1103   hs Troponin <2.7             FLUID BALANCE AND WEIGHT:                                           Last filed weight: Weight: 69.9 kg (154 lb 1.6 oz)  Weight on Admission: Weight: 75.3 kg (166 lb 0.1  oz)  Net IO Since Admission: -180 mL [02/07/24 0946]     Intake/Output Summary (Last 24 hours) at 02/07/2024 0700  Last data filed at 02/06/2024 0830  Gross per 24 hour   Intake 50 ml   Output --   Net 50 ml   Weight change:     Last 3 Weights for the past 72 hrs (Last 3 readings):   Weight   02/04/24 1831 69.9 kg (154 lb 1.6 oz)   02/04/24 1048 75.3 kg (166 lb 0.1 oz)        PHYSICAL EXAM:                                              BP 152/70   Pulse (!) 51   Temp 97.5 F (36.4 C)   Resp 18   Ht 1.346 m (4' 5)   Wt 69.9 kg (154 lb 1.6 oz)   SpO2 98%   BMI 38.57 kg/m     Physical Exam  Constitutional:       Appearance: Normal appearance.   Cardiovascular:      Rate and Rhythm: Normal rate and regular rhythm.      Pulses: Normal pulses.      Heart sounds: Normal heart sounds.   Pulmonary:      Effort: Pulmonary effort is normal. No respiratory distress.      Breath sounds: Normal breath sounds. No wheezing.   Abdominal:      General: Abdomen is flat. There is no distension.      Palpations: Abdomen is soft.      Tenderness: There is no abdominal tenderness.   Musculoskeletal:      Right lower leg: No edema.      Left lower leg: No edema.   Skin:     General: Skin is  warm.   Neurological:      General: No focal deficit present.      Mental Status: She is alert and oriented to person, place, and time.

## 2024-02-07 NOTE — Plan of Care (Signed)
 Problem: Moderate/High Fall Risk Score >5  Goal: Patient will remain free of falls  Outcome: Progressing  Flowsheets (Taken 02/07/2024 0142)  High (Greater than 13):   MOD-Remain with patient during toileting   MOD-Re-orient confused patients   MOD-Perform dangle, stand, walk (DSW) prior to mobilization   MOD-Include family in multidisciplinary POC discussions   MOD-Request PT/OT consult order for patients with gait/mobility impairment   MOD-Place Fall Risk level on whiteboard in room   MOD-Utilize diversion activities   MOD-Use of assistive devices -Bedside Commode if appropriate   MOD-Consider a move closer to Nurses Station     Problem: Pain interferes with ability to perform ADL  Goal: Pain at adequate level as identified by patient  Outcome: Progressing  Flowsheets (Taken 02/07/2024 0142)  Pain at adequate level as identified by patient:   Identify patient comfort function goal   Assess for risk of opioid induced respiratory depression, including snoring/sleep apnea. Alert healthcare team of risk factors identified.   Reassess pain within 30-60 minutes of any procedure/intervention, per Pain Assessment, Intervention, Reassessment (AIR) Cycle     Problem: Side Effects from Pain Analgesia  Goal: Patient will experience minimal side effects of analgesic therapy  Outcome: Progressing     Problem: Pain  Goal: Pain at adequate level as identified by patient  Outcome: Progressing  Flowsheets (Taken 02/07/2024 0142)  Pain at adequate level as identified by patient:   Identify patient comfort function goal   Assess for risk of opioid induced respiratory depression, including snoring/sleep apnea. Alert healthcare team of risk factors identified.   Reassess pain within 30-60 minutes of any procedure/intervention, per Pain Assessment, Intervention, Reassessment (AIR) Cycle

## 2024-02-07 NOTE — Progress Notes (Signed)
 02/07/24 1042 02/07/24 1046 02/07/24 1054   Vital Signs   BP 90/48 (!) 74/48 105/67   BP Location Left arm  --  Left arm   BP Method Automatic Automatic Automatic   MAP (mmHg) (!) 62 (!) 57 80   Patient Position Lying Sitting Standing

## 2024-02-07 NOTE — Progress Notes (Signed)
 Trio rounding template   02/07/2024    - Brief  background -Emily [name] is a 78 y.o. female with Past medical history of breast cancer, hypertension, hyperlipidemia, GERD, came in today with dizziness/vomiting, found to be hypertensive to 220/90, admitted with hypertensive emergency Per Md note    -Last 24 Hrs (any critical events ,abnormal VS, treatment, trend ): Visit Vitals  BP 108/55   Pulse (!) 58   Temp 98.2 F (36.8 C) (Oral)   Resp 18   Ht 1.346 m (4\' 5" )   Wt 69.9 kg (154 lb 1.6 oz)   SpO2 96%   BMI 38.57 kg/m     Pt noted to have episodes of intermittent confusion, virtual safety monitoring initiated  -Respiratory (changes in oxygenation): room air   -Cardiac Rhythm - if changes-  Normal sinus rhythm on tele   - Weight trend if applicable -       11/25/2023 12/23/2023 02/04/2024   Weight Monitoring   Height 160 cm 162.6 cm 134.6 cm    129.5 cm   Height Method Stated Stated Stated   Weight 71.5 kg 76.2 kg 69.899 kg    75.3 kg   Weight Method Standing Scale Bed Scale Bed Scale   BMI (calculated) 27.9 kg/m2 28.8 kg/m2 41.7 kg/m2    44.9 kg/m2       Multiple values from one day are sorted in reverse-chronological order      -GI/GU (changes in out put, BM, Foley): X1 assist to the bathroom   -Nutrition (diabetic, NPO): Regular thin   -Activity / Safety/ mobility  questions (PMP, need for PT/ OT , sitter) : standby assist top the bathroom   -Upcoming Procedures :  TTE completed, EF 70%, Doppler's completed   -Discharge Date/ Plans:  TBD   -Questions/ orders needed  none at this time     Note : Night shift to complete prior to Trio rounding   Arnold Lapine, Charity fundraiser

## 2024-02-07 NOTE — OT Progress Note (Signed)
 .Occupational Therapy Treatment  Emily [name]        Post Acute Care Therapy Recommendations:     Discharge Recommendations:  Home with home health OT, Home with 24/7 supervision    If Home with home health OT, Home with 24/7 supervision  recommended discharge disposition is not available, patient will need SPV assist for ADL/mobility and ALF.     DME needs IF patient is discharging home: Front wheel walker    Therapy discharge recommendations may change with patient status.  Please refer to most recent note for up-to-date recommendations.         [facility]  [address]  [place] [address]  [phone]    Occupational Therapy Treatment    Patient: Emily [name]    MRN#: [id number]  Unit: [facility]         Bed: FP366/FP366-97    Medical Diagnosis: Hypertensive emergency [I16.1]    Time of treatment:    OT Received On: 02/07/24  Start Time: 1348  Stop Time: 1432  Time Calculation (min): 44 min  Total Treatment Time (min):  (+ 35 nonbillable mins coordinating with PT, communicating with IDT and d/c planning)    Treatment #:  OT Visit Number: 1/7    Patient's medical condition is appropriate for Occupational Therapy  intervention at this time.  Assessment   Received bed level. Bed mobility SBA, STS close SBA. Orthostatics negative. Pt ambulated to RR with one episode L knee buckling, self corrected though req education on insight to deficits and safety concerns. Sat at sink on [facility] for bathing set up/SPV for safety. UBD set up, LBD SBA and cues. Pt ambulated back to bedside SBA and left seated in chair.     Incr time providing handoff to PT, MD, ICM re. Pt concerns for d/c and safe d/c planning.    Pt p/w impaired activity tolerance, strength/ROM, balance, cognition (possible progressive cognitive decline), safety awareness, upright tolerance, indep with ADL's and tux/mobility required for ADL's, functional skill, leisure skill, functional endurance, caregiver/parent  knowledge on how to assist with positioning, transfers, equipment, ambulation, stairs, HEP, or adapted occupations.       PMP - Progressive Mobility Protocol   PMP Activity: Step 6 - Walks in Room  Distance Walked (ft) (Step 6,7): 50 Feet     Plan     Continue plan of care.  OT Frequency Recommended: 3-4x/wk    Interdisciplinary Communication: RN re. Pt status    Subjective   Patient is agreeable to participation in the therapy session. Nursing clears patient for therapy.  Pain: Pt unable to quantify  Location: posterior neck  Therapist Intervention: positioned for comfort  Patient is satisfied with therapist intervention.    Objective   Precautions:   Precautions  Weight Bearing Status: no restrictions  Other Precautions: fall risk    Patient is in bed with telemetry, intravenous access, bed alarm, and telesitter  in place.    Orientation/Cognition:     Alert and Oriented x 3  Cognition: impaired insight and safety awareness    Self Care:  Feeding: Independent  Grooming: mod-I/set up  Bathing: SPV for safety  UB Dressing: set up  LB Dressing: SPV  Toileting: SPV    Vitals:     Vitals:    02/07/24 1257 02/07/24 1353 02/07/24 1357 02/07/24 1359   BP: 116/54 128/83 142/53 122/72   Pulse: 77 71 92 (!) 102   Resp: 18  Temp: 98.2 F (36.8 C)      TempSrc:       SpO2: 98%      Weight:       Height:           Education:   Educated the patient/family/caregiver to the role of occupational therapy, plan of care,  goals of therapy, rationale for progressing mobility and safety with mobility and ADLs, discharge instructions, and home safety.    RN notified of session outcome and that patient was left in seated in chair with all needs met and equipment intact.   Safety measures include: handoff to nurse/clin tech/ unit secretary completed, chair alarm activated, oriented to call bell and placed within reach, personal items within reach, assistive device positioned out of reach, and telesitter in use.   Mobility and ADL  status posted at bedside and within E.M.R.        AM-PACT 6 Clicks Daily Activity Inpatient Short Form  Inpatient AM-PACT Performed?: yes  Put On/Take Off Lower Body Clothing: 3  Assist with Bathing: 3  Assist with Toileting: 3  Put On/Take Off Upper Body Clothing: 3  Assist with Grooming: 3  Assist with Eating: 4  OT Daily Activity Raw Score: 19  CMS 0-100% Score: 42.80%     Goals:  Goals  Goal Formulation: Patient  Time For Goal Achievement: by time of discharge  Goals: Select goal  Patient will dress upper body: Modified Independent  Patient will dress lower body: Modified Independent  Patient will toilet: Modified Independent  Pt will perform functional transfers: Modified Independent      Signature:   Emily [name], OT  02/07/2024 3:17 PM

## 2024-02-07 NOTE — Progress Notes (Signed)
 CM Update: per DME Tech they will deliver junior FWW to patient's home since the hospital doesn't have in stock. Pt was noted to have been given a list of [facility] providers as well.  Jacky Massing, MSW   Case Management  [facility]  [phone]

## 2024-02-07 NOTE — Progress Notes (Signed)
 Trio rounding template   02/07/2024    - Brief  background -Shelbylynn Walczyk is a 78 y.o. female with  Past medical history of breast cancer, hypertension, hyperlipidemia, GERD, came in today with dizziness/vomiting, found to be hypertensive to 220/90, admitted with hypertensive emergency.    -Last 24 Hrs (any critical events ,abnormal VS, treatment, trend ): none    -Respiratory (changes in oxygenation): on room air with O2 sats on high 90s    -Cardiac Rhythm - if changes-  Sinus Rhythm low 80s to 60s HR    - Weight trend if applicable - n/a    -GI/GU (changes in out put, BM, Foley): continent of both; voiding to bathroom commode with adequate amount of yellow colored urine output with no bowel movement overnight    -Pain: c/o posterior headache 5/10 as she rated it; PRN Tylenol  650 mg pills po administered,with relief    -Nutrition (diabetic, NPO): Regular diet with thin liquids    -Activity / Safety/ mobility  questions (PMP, need for PT/ OT , sitter) : RVM camera in patient room; 1 assist to the bathroom; PT/OT recommended-  Home with supervision, Home with home health PT      -Upcoming Procedures :  none at this time    -Discharge Date/ Plans:  possibly during daytime 5/8th    -Questions/ orders needed      Note : Night shift to complete prior to Trio rounding   Darice JINNY Gartner, RN

## 2024-02-07 NOTE — Plan of Care (Signed)
 Patient remain alert with occasional periods of confusion noted. Room air breathing easy.. Ambulate with rolling walker staff standby. Seen by PT/OT. Tolerated sitting up in the chair. Denies pain. Bp stable. Per team, patient stable for discharge. Son Heinz Llano visiting, updated with plan of care.Patient up in the chair eating dinner. Chair alarm in place.

## 2024-02-08 MED ORDER — AMLODIPINE BESYLATE 10 MG PO TABS: 10.0000 mg | ORAL_TABLET | Freq: Every day | ORAL | 1 refills | Status: AC

## 2024-02-08 MED ORDER — MECLIZINE HCL 12.5 MG PO TABS: 12.5000 mg | ORAL_TABLET | Freq: Three times a day (TID) | ORAL | 0 refills | Status: AC | PRN

## 2024-02-08 MED ORDER — LISINOPRIL 40 MG PO TABS
40.0000 mg | ORAL_TABLET | Freq: Every day | ORAL | 1 refills | Status: AC
Start: 2024-02-09 — End: 2024-04-09

## 2024-02-08 MED ORDER — PANTOPRAZOLE SODIUM 40 MG PO TBEC
40.0000 mg | DELAYED_RELEASE_TABLET | Freq: Every morning | ORAL | 0 refills | Status: DC
Start: 2024-02-09 — End: 2024-06-09

## 2024-02-08 MED ORDER — ROSUVASTATIN CALCIUM 40 MG PO TABS
40.0000 mg | ORAL_TABLET | Freq: Every day | ORAL | 0 refills | Status: DC
Start: 2024-02-08 — End: 2024-06-09

## 2024-02-08 NOTE — Plan of Care (Signed)
 Problem: Moderate/High Fall Risk Score >5  Goal: Patient will remain free of falls  Outcome: Progressing  Flowsheets (Taken 02/08/2024 2346)  High (Greater than 13):   HIGH-Consider use of low bed   HIGH-Apply yellow Fall Risk arm band   HIGH-Bed alarm on at all times while patient in bed     Problem: Safety  Goal: Patient will be free from injury during hospitalization  Outcome: Progressing  Flowsheets (Taken 02/08/2024 2349)  Patient will be free from injury during hospitalization:   Assess patient's risk for falls and implement fall prevention plan of care per policy   Provide and maintain safe environment   Use appropriate transfer methods   Ensure appropriate safety devices are available at the bedside   Include patient/ family/ care giver in decisions related to safety   Hourly rounding  Goal: Patient will be free from infection during hospitalization  Outcome: Progressing  Flowsheets (Taken 02/08/2024 2349)  Free from Infection during hospitalization:   Assess and monitor for signs and symptoms of infection   Monitor lab/diagnostic results   Monitor all insertion sites (i.e. indwelling lines, tubes, urinary catheters, and drains)   Encourage patient and family to use good hand hygiene technique     Problem: Hemodynamic Status: Cardiac  Goal: Stable vital signs and fluid balance  Outcome: Progressing  Flowsheets (Taken 02/08/2024 2349)  Stable vital signs and fluid balance: Monitor lab values     Problem: Compromised Sensory Perception  Goal: Sensory Perception Interventions  Outcome: Progressing     Problem: Compromised Moisture  Goal: Moisture level Interventions  Outcome: Progressing     Problem: Compromised Activity/Mobility  Goal: Activity/Mobility Interventions  Outcome: Progressing     Problem: Compromised Nutrition  Goal: Nutrition Interventions  Outcome: Progressing

## 2024-02-08 NOTE — Discharge Summary (Signed)
 [facility]  Internal Medicine Hospitalists  Discharge Summary          Date of Admission: 02/04/2024  Date of Discharge: 02/08/2024    Discharge Diagnoses:   Principal diagnosis--hypertensive urgency  #History of breast cancer  #Hyperlipidemia  #GERD  #History of hypothyroidism         Hospital Course:      Reason for Admission:  See H&P for full details regarding his patient's admission.  Briefly, the patient is a 78 y.o. female with past medical history of breast cancer, hypertension, hyperlipidemia, GERD, came in today with dizziness/vomiting, found to be hypertensive to 220/90, admitted with hypertensive urgency      Hospital Course:  #Hypertensive urgency  -Likely secondary to noncompliance due to finances  -Initially hypertensive to 220/90, improved with labetalol   - TTE 02/06/2024 with normal EF of 65-70%  -Patient had adjustment of BP meds and was noted with fair BP control on amlodipine  10 mg daily, lisinopril  40 mg daily     # Dizziness, ambulatory dysfunction--uncertain exact etiology of dizziness but possibly related to vertigo  - The patient was followed by PT in hospital  - Patient did have improvement of symptoms in hospital  - Patient did have CT head 5/4 with no acute findings and MRI brain 5/4 with no acute findings.     #Right arm numbness and tingling  -Pt states its related to shoulder surgery a few years back   -As above, patient did have CT head 5/4 with no acute findings and MRI brain 5/4 with no acute findings.     #History of breast cancer  - Continued Arimidex      #Hyperlipidemia  -continued statin     #GERD  -continued Protonix     #Hx of hypothyroidism  -noted TSH of 0.66 in hospital on 02/04/2024 and pt appears not to have been taking synthroid  for possibly 6 months  -patient to continue off of synthroid  on discharge and would benefit from repeat TFT's as outpatient.    #Elevated D-dimer  - Right lower extremity Doppler negative for DVT 5/6  - CTA chest negative for PE on  5/4        Discharge Day Exam:   Temp:  [97.7 F (36.5 C)-98.2 F (36.8 C)] 97.9 F (36.6 C)  Heart Rate:  [60-102] 78  Resp Rate:  [16-18] 18  BP: (74-142)/(48-83) 132/73  General: Alert and awake, no distress  CVS: Normal S1 and S2, no gallop or rub  Lungs: Clear to auscultation bilaterally, no rhonchi or wheezing  Abdomen: Soft, nontender  Extremities: No pedal edema, no cyanosis    Pertinent Labs:     Recent Labs   Lab 02/07/24  0557 02/06/24  0632 02/05/24  0653   WBC 6.77 5.97 7.31   Hemoglobin 12.2 11.1* 11.6   Hematocrit 40.2 35.6 37.3   Platelet Count 285 285 275     BMP:   Recent Labs   Lab 02/07/24  0557 02/06/24  0632 02/05/24  0653   Sodium 143 141 142   Potassium 4.4 4.6 4.6   Chloride 101 105 107   CO2 29 31* 26   BUN 13 15 12    Creatinine 1.0 0.9 0.9   Calcium  9.3 9.2 8.9   Glucose 104* 97 102*     LFT:   Recent Labs   Lab 02/07/24  0557 02/06/24  0632 02/05/24  0653   Albumin  3.8 3.7 3.8   Protein, Total 6.9 6.5 6.7  Bilirubin, Total 0.3 0.3 0.2   Alkaline Phosphatase 72 70 78   ALT 15 10 15    AST (SGOT) 26 22 23              Radiology and Procedures:   Radiology: all results from this admission  US  Venous Duplex Doppler Leg Right  Result Date: 02/06/2024   No evidence of right lower extremity DVT. Lonni Hamburg, MD 02/06/2024 9:25 AM    MRI Brain WO Contrast  Result Date: 02/04/2024   1.No acute intracranial abnormality. Specifically, no acute infarct. 2.Mild chronic small vessel ischemic disease and generalized cerebral volume loss. Milan Arne Jumbo, MD 02/04/2024 2:59 PM    CT Head WO Contrast  Result Date: 02/04/2024   1.Stable partially empty sella. 2.No intracranial hemorrhage or mass is seen. 3.There is been no change since the prior study of December 23, 2023. Belvie Lack, MD 02/04/2024 12:46 PM    CT Angio Chest (PE study)  Result Date: 02/04/2024  1.No pulmonary embolism. 2.Cardiomegaly. 3.Mild atelectasis. JINNY Alverta Confer, MD 02/04/2024 12:17 PM    XR Chest  AP Portable  Result Date:  02/04/2024  Moderate pulmonary edema. Nitin Kumar, MD 02/04/2024 11:37 AM     Discharge Medications and Documented Allergies:        Discharge Medication List        Taking      amLODIPine  10 MG tablet  Dose: 10 mg  Commonly known as: NORVASC   For: High Blood Pressure  Take 1 tablet (10 mg) by mouth once daily     anastrozole  1 MG tablet  Dose: 1 mg  Commonly known as: ARIMIDEX   For: Early Cancer of the Breast in Postmenopausal Women  Take 1 tablet (1 mg total) by mouth every morning     aspirin  81 MG chewable tablet  Dose: 81 mg  Chew 1 tablet (81 mg total) by mouth daily     lisinopril  40 MG tablet  Dose: 40 mg  Commonly known as: ZESTRIL   For: High Blood Pressure  Start taking on: Feb 09, 2024  Take 1 tablet (40 mg) by mouth once daily     meclizine  12.5 MG tablet  Dose: 12.5 mg  What changed:   medication strength  how much to take  Commonly known as: ANTIVERT   For: Dizzy  Take 1 tablet (12.5 mg) by mouth 3 (three) times daily as needed for Dizziness     multivitamin with minerals tablet  Dose: 1 tablet  Take 1 tablet by mouth once every morning     pantoprazole  40 MG tablet  Dose: 40 mg  What changed: when to take this  Commonly known as: PROTONIX   For: Gastroesophageal Reflux Disease  Start taking on: Feb 09, 2024  Take 1 tablet (40 mg) by mouth every morning before breakfast     rosuvastatin  40 MG tablet  Dose: 40 mg  Commonly known as: CRESTOR   For: High Amount of Fats in the Blood  Take 1 tablet (40 mg) by mouth once daily            STOP taking these medications      celecoxib  200 MG capsule  Commonly known as: CeleBREX      furosemide  10 MG/ML solution  Commonly known as: LASIX      IRON PO              Allergies[1]         Disposition:     Discharge Disposition: Home with family  Discharge Code Status: Full Code    Patient Emergency Contact:  Extended Emergency Contact Information  Primary Emergency Contact: AMOAH,Theophilus  Mobile Phone: [phone]  Relation: Son  Preferred language: English  Interpreter  needed? No  Secondary Emergency Contact: Harris,Gloria  Mobile Phone: [phone]  Relation: Cousin    Discharge Instructions:     Patient Instructions: (See AVS for full details)      Outpatient Follow-Up Plan:     Instructions for PCP:      Appointments:   Follow-up Information       [facility].    Specialty: Home Health Services  Contact information:  [address]  [place]  [address]  [phone]             Franchot Corean BIRCH, MD. Schedule an appointment as soon as possible for a visit.    Specialty: Internal Medicine  Contact information:  [address]  [place] [address]  [phone]                             Pending Labs, Microbiology, and Pathology:  Unresulted Labs       None            Attestations and Signatures:     Minutes spent coordinating discharge and reviewing discharge plan: 45 minutes; additionally discussed with son over phone    Signed by: Dorn CHRISTELLA Kubas, MD           [1]   Allergies  Allergen Reactions    Morphine  Itching    Morphine  And Codeine  Itching and Nausea And Vomiting     dizzy

## 2024-02-08 NOTE — Progress Notes (Signed)
 CM Update: CM called AMOAH,Theophilus (Son) at [phone] [facility]). There was no answer and CM left voice message requesting a return call. CM called son Aliene at [phone] and reviewed patient's second IMM letter. He stated he is coming from [place] and will be able to pick her up on Friday 5/9 at 10am to transport home and pick up any needed prescriptions. CM met patient at her bedside to issue her IMM letter. CM explained the letter to patient that she might accrue a bill for the additional night stay. Patient verbalized an understanding and signed IMM letter.   Montie Due, MSW   Case Management  [place] [facility]  [phone]

## 2024-02-08 NOTE — Progress Notes (Signed)
 CM Update: CM called AMOAH,Theophilus (Son) at  [phone] [facility]). Son answered and CM verified DOB. Son reported that he agrees with discharge plans and plans to be in he area on Saturday. He went on to report that his brother, patient's sn Aliene will be in the area on Friday 5/9 and is willing to pick up patient and transport home.  Family will provide supervision and assist with ADLs as needed.  Aliene can be reached at [phone]. Patient was provided a list of discount medications.  Montie Due, MSW   Case Management  [facility]  [phone]

## 2024-02-08 NOTE — Progress Notes (Signed)
 CARDIOLOGY HOSPITAL NOTE  Algernon Prier, [place]   [facility] Cardiology  Office phone:  [phone]     [facility] phone [phone]  [facility] phone [phone]    Date:  02/08/2024  8:10 AM    Patient:  Emily [name].  DOB: 03/29/1946.  78 y.o.  female , MRN: [id number]   Attending:  Elvera Dorn HERO, MD   Admission date:  02/04/2024         Assessment and Plan:   I personally spoke to the patient, performed a physical exam, and reviewed pertinent labs/radiology/diagnostic studies. The medical decision making was formulated by me in discussion with the APP. I agree with the note written by the APP after review and edited with caveats when appropriate.     36 F with HTN, HL, breast CA, pw hypertensive urgency.    BPs better controlled. Cont current management. Reinforced compliance. No need for cardiology follow up - f/u with PCP        ASSESSMENT & PLAN:     78 y.o.female with hypertension, hyperlipidemia, breast cancer who presented on 02/04/24 with nausea and vomiting and was found to be hypertensive with BP 220/90.     Hypertensive urgency, primary diagnosis   - echo on 02/06/24 demonstrated normal LV global systolic function with EF 65-70%   - BP well-controlled at this time   - continue amlodipine  10 mg QD, Lisinopril  40 mg QD    Hyperlipidemia  - continue rosuvastatin  40 mg QD     Cardiology will sign off.     INTERVAL HISTORY:     5/4: admitted for hypertensive emergency   5/6: echo with normal LV systolic function with EF 65-70%, HCTZ added  5/7: hypotensive with BP 74/48, hold HCTZ   5/8: normotensive, no further cardiac events     MEDICATIONS:    INFUSION MEDS:      SCHEDULED MEDS:     Current Facility-Administered Medications   Medication Dose Route Frequency    amLODIPine   10 mg Oral Daily    anastrozole   1 mg Oral QAM    enoxaparin   40 mg Subcutaneous Daily    lisinopril   40 mg Oral Daily    pantoprazole   40 mg Oral QAM AC    rosuvastatin   40 mg Oral Daily      PRN  MEDS:   acetaminophen , benzocaine -menthol , benzonatate , carboxymethylcellulose sodium, dextrose  **OR** dextrose  **OR** dextrose  **OR** glucagon  (rDNA), hydrALAZINE , labetalol , magnesium  sulfate, meclizine , melatonin, naloxone , ondansetron , potassium & sodium phosphates , potassium chloride  **OR** potassium chloride  **OR** potassium chloride , saline    DIAGNOSTICS:       02/08/24: Telemetry  Normal sinus at 63 BPM     02/06/24: TTE  Summary    * Limited study for LVEF assessment.    * Left ventricular global systolic function is normal.  Calculated LV  ejection fraction is  70 % by the Simpsons biplane method and 65-70% visually.    * Compared to the prior study dated 01/28/2020, there has been no significant  change.      02/04/24: ECG  Sinus nradycardia at 59 BPM     01/28/20: TTE  * Left ventricular ejection fraction is normal with an estimated ejection  fraction of 60-65%.    * Left ventricular wall thickness is mildly increased.    * No significant valvular dysfunction.     04/18/19: Cardiac stress test  Negative for ischemia    LABS:   Labs reviewed:  Recent Labs  Lab 02/07/24  0557 02/06/24  0632 02/05/24  0653   WBC 6.77 5.97 7.31   Hemoglobin 12.2 11.1* 11.6   Hematocrit 40.2 35.6 37.3   Platelet Count 285 285 275     Recent Labs   Lab 02/07/24  0557 02/06/24  0632 02/05/24  0653   Sodium 143 141 142   Potassium 4.4 4.6 4.6   Chloride 101 105 107   CO2 29 31* 26   BUN 13 15 12    Creatinine 1.0 0.9 0.9   GFR 57.0* >60.0 >60.0   Glucose 104* 97 102*   Calcium  9.3 9.2 8.9     Recent Labs   Lab 02/04/24  1103   hs Troponin <2.7             FLUID BALANCE AND WEIGHT:                                           Last filed weight: Weight: 69.9 kg (154 lb 1.6 oz)  Weight on Admission: Weight: 75.3 kg (166 lb 0.1 oz)  Net IO Since Admission: 440 mL [02/08/24 0810]     Intake/Output Summary (Last 24 hours) at 02/08/2024 0700  Last data filed at 02/07/2024 2130  Gross per 24 hour   Intake 620 ml   Output --   Net 620 ml   Weight  change:     No data found.       PHYSICAL EXAM:                                              BP 132/73   Pulse 78   Temp 97.9 F (36.6 C) (Oral)   Resp 18   Ht 1.346 m (4' 5)   Wt 69.9 kg (154 lb 1.6 oz)   SpO2 99%   BMI 38.57 kg/m     Physical Exam  Constitutional:       General: She is not in acute distress.     Appearance: Normal appearance.   Cardiovascular:      Rate and Rhythm: Normal rate and regular rhythm.      Pulses: Normal pulses.      Heart sounds: Normal heart sounds.   Pulmonary:      Effort: Pulmonary effort is normal. No respiratory distress.      Breath sounds: Normal breath sounds. No wheezing.   Abdominal:      General: Abdomen is flat. There is no distension.      Palpations: Abdomen is soft.      Tenderness: There is no abdominal tenderness.   Musculoskeletal:      Right lower leg: No edema.      Left lower leg: No edema.   Skin:     General: Skin is warm.   Neurological:      General: No focal deficit present.      Mental Status: She is alert and oriented to person, place, and time.

## 2024-02-08 NOTE — OT Progress Note (Signed)
 Occupational Therapy Treatment  Ronal Cutter Lehrman        Post Acute Care Therapy Recommendations:     Discharge Recommendations:  Home with home health OT, Home with supervision    If Home with home health OT, Home with supervision  recommended discharge disposition is not available, patient will need SBA/min assist for ADL and ALF.     DME needs IF patient is discharging home: Front wheel walker    Therapy discharge recommendations may change with patient status.  Please refer to most recent note for up-to-date recommendations.         [facility]  [address]  [place] [address]  [phone]    Occupational Therapy Treatment    Patient: Emily [name]    MRN#: [id number]  Unit: [facility] INTERMEDIATE CARE         Bed: FP366/FP366-97    Medical Diagnosis: Hypertensive emergency [I16.1]    Time of treatment:    OT Received On: 02/08/24  Start Time: 1437  Stop Time: 1508  Time Calculation (min): 31 min  Total Treatment Time (min):  (+ 35 nonbillable mins coordinating with PT, communicating with IDT and d/c planning)    Treatment #:  OT Visit Number: 2/7    Patient's medical condition is appropriate for Occupational Therapy  intervention at this time.  Assessment   Received bed level, orthostatics negative, results below and in flow sheet. Pt more confused today, repeated cues to remain standing though pt continued to attempt to sit d/t reduced attention. Perseverative on missing purse, though no record of purse in house on admission. Pt does have personal belonging back with house keys and clothing from admit, states why would she leave the house with a plastic bag. Continued to redirect to hospital process placing personal belongings in these bags, pt unreceptive. Additionally upset about the clin tech asking if pt had a BM, stating this is not their job and it's too personal and inappropriate. Offered therapeutic use of self addressing concerns and provided insight on nursing staff protocol  to monitor these functions for pt safety - again unreceptive to this education. Redirected to functional mobility trial d/t leg buckling episode last session. Pt ambulated in hall >200 feet without LOB or buckling. Distracted by personnel and interacting frequently with other individuals in the hall, very sociable. Upon return to room, pt again expressing concern someone has stolen her belongings. Offered reassurance and charge is involved with issue.         02/08/24 1441 02/08/24 1444 02/08/24 1446   Vital Signs   Heart Rate 77 79 91   BP 129/65 128/77 109/64   BP Location Left arm Left arm Left arm   BP Method Automatic Automatic Automatic   MAP (mmHg) 86 94 79   Patient Position Lying Sitting Standing  (1 minute)      02/08/24 1448   Vital Signs   Heart Rate (!) 105   BP 121/80   BP Location Left arm   BP Method Automatic   MAP (mmHg) 94   Patient Position Standing  (2 minutes)     PMP - Progressive Mobility Protocol   PMP Activity: Step 7 - Walks out of Room  Distance Walked (ft) (Step 6,7): 200 Feet     Plan     Continue plan of care.  OT Frequency Recommended: 3-4x/wk    Interdisciplinary Communication: RN re. Pt status    Subjective   Why would they ask  me if I had a BM? That is very upsetting to me.    Patient is agreeable to participation in the therapy session. Nursing clears patient for therapy.  Pain: no evidence of pain noted during session  Objective   Precautions:   Precautions  Weight Bearing Status: no restrictions  Other Precautions: fall risk, intermittent confusion    Patient is in bed with intravenous access in place.    Orientation/Cognition:     Alert and Oriented x 3  Cognition: intermittent confusion, reduced attention    Self Care:  Feeding: Independent  Grooming: Indep  Bathing: SPV  UB Dressing: indep  LB Dressing: SPV  Toileting: SPV    Functional Mobility:  Bed mobility: indep  Transfers: without DME, SPV for safety only  Mobility/Ambulation: SPV for safety only, occasionally  utilizing hallway railings for stability, no LOB or buckling today    Vitals:     Vitals:    02/08/24 1441 02/08/24 1444 02/08/24 1446 02/08/24 1448   BP: 129/65 128/77 109/64 121/80   Pulse: 77 79 91 (!) 105   Resp:       Temp:       TempSrc:       SpO2:       Weight:       Height:           Education:   Educated the patient/family/caregiver to the role of occupational therapy, plan of care,  goals of therapy, rationale for progressing mobility and safety with mobility and ADLs, discharge instructions, and home safety.    RN notified of session outcome and that patient was left in seated in room chair with all needs met and equipment intact.   Safety measures include: handoff to nurse/clin tech/ unit secretary completed, chair alarm activated, oriented to call bell and placed within reach, personal items within reach, assistive device positioned out of reach, and telesitter in use.   Mobility and ADL status posted at bedside and within E.M.R.        AM-PACT 6 Clicks Daily Activity Inpatient Short Form  Inpatient AM-PACT Performed?: yes  Put On/Take Off Lower Body Clothing: 3  Assist with Bathing: 3  Assist with Toileting: 3  Put On/Take Off Upper Body Clothing: 3  Assist with Grooming: 3  Assist with Eating: 4  OT Daily Activity Raw Score: 19  CMS 0-100% Score: 42.80%     Goals:  Goals  Goal Formulation: Patient  Time For Goal Achievement: by time of discharge  Goals: Select goal  Patient will dress upper body: Modified Independent  Patient will dress lower body: Modified Independent  Patient will toilet: Modified Independent  Pt will perform functional transfers: Modified Independent    Signature:   Chiquita Dunnings, OT  02/08/2024 4:52 PM

## 2024-02-08 NOTE — Plan of Care (Signed)
 Problem: Moderate/High Fall Risk Score >5  Goal: Patient will remain free of falls  Flowsheets (Taken 02/08/2024 0916)  Moderate Risk (6-13):   MOD-Remain with patient during toileting   MOD- Consider video monitoring   MOD-Apply bed exit alarm if patient is confused   MOD-Utilize diversion activities   MOD-Request PT/OT consult order for patients with gait/mobility impairment     Problem: Safety  Goal: Patient will be free from injury during hospitalization  Flowsheets (Taken 02/08/2024 0916)  Patient will be free from injury during hospitalization:   Assess patient's risk for falls and implement fall prevention plan of care per policy   Provide and maintain safe environment   Use appropriate transfer methods   Hourly rounding   Include patient/ family/ care giver in decisions related to safety   Ensure appropriate safety devices are available at the bedside  Goal: Patient will be free from infection during hospitalization  Flowsheets (Taken 02/08/2024 0916)  Free from Infection during hospitalization:   Assess and monitor for signs and symptoms of infection   Monitor all insertion sites (i.e. indwelling lines, tubes, urinary catheters, and drains)   Monitor lab/diagnostic results   Encourage patient and family to use good hand hygiene technique     Problem: Hemodynamic Status: Cardiac  Goal: Stable vital signs and fluid balance  Flowsheets (Taken 02/08/2024 0916)  Stable vital signs and fluid balance:   Assess signs and symptoms associated with cardiac rhythm changes   Monitor lab values     Problem: Ineffective Gas Exchange  Goal: Effective breathing pattern  Flowsheets (Taken 02/08/2024 0916)  Effective breathing pattern: Teach/reinforce use of ordered respiratory interventions (ie. CPAP, BiPAP, Incentive Spirometer, Acapella)     Problem: Compromised Sensory Perception  Goal: Sensory Perception Interventions  Flowsheets (Taken 02/08/2024 0916)  Sensory Perception Interventions: Offload heels, Pad bony prominences,  Reposition q 2hrs/turn Clock, Q2 hour skin assessment under devices if present     Problem: Compromised Activity/Mobility  Goal: Activity/Mobility Interventions  Flowsheets (Taken 02/08/2024 0916)  Activity/Mobility Interventions: Pad bony prominences, TAP Seated positioning system when OOB, Promote PMP, Reposition q 2 hrs / turn clock, Offload heels     Problem: Compromised Nutrition  Goal: Nutrition Interventions  Flowsheets (Taken 02/08/2024 0916)  Nutrition Interventions: Discuss nutrition at Rounds, I&Os, Document % meal eaten, Daily weights

## 2024-02-09 DIAGNOSIS — R42 Dizziness and giddiness: Secondary | ICD-10-CM

## 2024-02-09 DIAGNOSIS — I10 Essential (primary) hypertension: Secondary | ICD-10-CM

## 2024-02-09 LAB — URINALYSIS WITH REFLEX TO MICROSCOPIC EXAM - REFLEX TO CULTURE
Urine Bilirubin: NEGATIVE
Urine Blood: NEGATIVE
Urine Glucose: NEGATIVE
Urine Ketones: NEGATIVE mg/dL
Urine Leukocyte Esterase: NEGATIVE
Urine Nitrite: NEGATIVE
Urine Specific Gravity: 1.007 (ref 1.001–1.035)
Urine Urobilinogen: NORMAL mg/dL (ref 0.2–2.0)
Urine pH: 6.5 (ref 5.0–8.0)

## 2024-02-09 LAB — LAB USE ONLY - URINE GRAY CULTURE HOLD TUBE

## 2024-02-09 NOTE — Plan of Care (Signed)
 Problem: Moderate/High Fall Risk Score >5  Goal: Patient will remain free of falls  Outcome: Progressing  Flowsheets (Taken 02/09/2024 1300)  High (Greater than 13):   HIGH-Visual cue at entrance to patient's room   HIGH-Utilize chair pad alarm for patient while in the chair   HIGH-Bed alarm on at all times while patient in bed     Problem: Pain interferes with ability to perform ADL  Goal: Pain at adequate level as identified by patient  Outcome: Progressing  Flowsheets (Taken 02/09/2024 1536)  Pain at adequate level as identified by patient:   Identify patient comfort function goal   Assess pain on admission, during daily assessment and/or before any as needed intervention(s)   Evaluate if patient comfort function goal is met   Evaluate patient's satisfaction with pain management progress   Consult/collaborate with Physical Therapy, Occupational Therapy, and/or Speech Therapy   Include patient/patient care companion in decisions related to pain management as needed   Offer non-pharmacological pain management interventions   Reassess pain within 30-60 minutes of any procedure/intervention, per Pain Assessment, Intervention, Reassessment (AIR) Cycle     Problem: Safety  Goal: Patient will be free from injury during hospitalization  Outcome: Progressing  Flowsheets (Taken 02/09/2024 1536)  Patient will be free from injury during hospitalization:   Use appropriate transfer methods   Assess for patients risk for elopement and implement Elopement Risk Plan per policy   Include patient/ family/ care giver in decisions related to safety   Hourly rounding   Ensure appropriate safety devices are available at the bedside   Provide and maintain safe environment   Assess patient's risk for falls and implement fall prevention plan of care per policy  Goal: Patient will be free from infection during hospitalization  Outcome: Progressing  Flowsheets (Taken 02/09/2024 1536)  Free from Infection during hospitalization:   Assess and  monitor for signs and symptoms of infection   Monitor all insertion sites (i.e. indwelling lines, tubes, urinary catheters, and drains)   Monitor lab/diagnostic results   Encourage patient and family to use good hand hygiene technique     Problem: Pain  Goal: Pain at adequate level as identified by patient  Outcome: Progressing  Flowsheets (Taken 02/09/2024 1536)  Pain at adequate level as identified by patient:   Identify patient comfort function goal   Assess pain on admission, during daily assessment and/or before any as needed intervention(s)   Evaluate if patient comfort function goal is met   Evaluate patient's satisfaction with pain management progress   Consult/collaborate with Physical Therapy, Occupational Therapy, and/or Speech Therapy   Include patient/patient care companion in decisions related to pain management as needed   Offer non-pharmacological pain management interventions   Reassess pain within 30-60 minutes of any procedure/intervention, per Pain Assessment, Intervention, Reassessment (AIR) Cycle

## 2024-02-09 NOTE — Discharge Summary -  Nursing (Signed)
 Pt provided w/ D/C instructions, all questions and concerns addressed. PIV removed. Pt transported via wheelchair from the unit w/ all belongings.

## 2024-02-09 NOTE — Discharge Summary (Signed)
 [facility]  Internal Medicine Hospitalists  Discharge Summary          Date of Admission: 02/04/2024  Date of Discharge: 02/09/2024    Discharge Diagnoses:   Principal diagnosis--hypertensive urgency  #History of breast cancer  #Hyperlipidemia  #GERD  #History of hypothyroidism         Hospital Course:      Reason for Admission:  See H&P for full details regarding his patient's admission.  Briefly, the patient is a 78 y.o. female with past medical history of breast cancer, hypertension, hyperlipidemia, GERD, came in today with dizziness/vomiting, found to be hypertensive to 220/90, admitted with hypertensive urgency      Hospital Course:  #Hypertensive urgency  -Likely secondary to noncompliance due to finances  -Initially hypertensive to 220/90, improved with labetalol   - TTE 02/06/2024 with normal EF of 65-70%  -Patient had adjustment of BP meds and was noted with fair BP control on amlodipine  10 mg daily, lisinopril  40 mg daily     # Dizziness, ambulatory dysfunction--uncertain exact etiology of dizziness but possibly related to vertigo  - The patient was followed by PT in hospital  - Patient did have improvement of symptoms in hospital  - Patient did have CT head 5/4 with no acute findings and MRI brain 5/4 with no acute findings.     #Right arm numbness and tingling  -Pt states its related to shoulder surgery a few years back   -As above, patient did have CT head 5/4 with no acute findings and MRI brain 5/4 with no acute findings.     #History of breast cancer  - Continued Arimidex      #Hyperlipidemia  -continued statin     #GERD  -continued Protonix     #Hx of hypothyroidism  -noted TSH of 0.66 in hospital on 02/04/2024 and pt appears not to have been taking synthroid  for possibly 6 months  -patient to continue off of synthroid  on discharge and would benefit from repeat TFT's as outpatient.    #Elevated D-dimer  - Right lower extremity Doppler negative for DVT 5/6  - CTA chest negative for PE on  5/4    ADDENDUM:  Above discharge summary completed on 5/8.  The patient remained in hospital while finalizing discharge arrangements with casemanager.      Discharge Day Exam:   Temp:  [97.7 F (36.5 C)-98.1 F (36.7 C)] 97.9 F (36.6 C)  Heart Rate:  [60-105] 66  Resp Rate:  [16-19] 16  BP: (95-144)/(57-80) 130/57  General: Alert and awake, no distress  CVS: Normal S1 and S2, no gallop or rub  Lungs: Clear to auscultation bilaterally, no rhonchi or wheezing  Abdomen: Soft, nontender  Extremities: No pedal edema, no cyanosis    Pertinent Labs:     Recent Labs   Lab 02/07/24  0557 02/06/24  0632 02/05/24  0653   WBC 6.77 5.97 7.31   Hemoglobin 12.2 11.1* 11.6   Hematocrit 40.2 35.6 37.3   Platelet Count 285 285 275     BMP:   Recent Labs   Lab 02/07/24  0557 02/06/24  0632 02/05/24  0653   Sodium 143 141 142   Potassium 4.4 4.6 4.6   Chloride 101 105 107   CO2 29 31* 26   BUN 13 15 12    Creatinine 1.0 0.9 0.9   Calcium  9.3 9.2 8.9   Glucose 104* 97 102*     LFT:   Recent Labs   Lab 02/07/24  9442 02/06/24  9367 02/05/24  0653   Albumin  3.8 3.7 3.8   Protein, Total 6.9 6.5 6.7   Bilirubin, Total 0.3 0.3 0.2   Alkaline Phosphatase 72 70 78   ALT 15 10 15    AST (SGOT) 26 22 23        Radiology and Procedures:   Radiology: all results from this admission  US  Venous Duplex Doppler Leg Right  Result Date: 02/06/2024   No evidence of right lower extremity DVT. Lonni Hamburg, MD 02/06/2024 9:25 AM    MRI Brain WO Contrast  Result Date: 02/04/2024   1.No acute intracranial abnormality. Specifically, no acute infarct. 2.Mild chronic small vessel ischemic disease and generalized cerebral volume loss. Milan Arne Jumbo, MD 02/04/2024 2:59 PM    CT Head WO Contrast  Result Date: 02/04/2024   1.Stable partially empty sella. 2.No intracranial hemorrhage or mass is seen. 3.There is been no change since the prior study of December 23, 2023. Belvie Lack, MD 02/04/2024 12:46 PM    CT Angio Chest (PE study)  Result Date:  02/04/2024  1.No pulmonary embolism. 2.Cardiomegaly. 3.Mild atelectasis. JINNY Alverta Confer, MD 02/04/2024 12:17 PM    XR Chest  AP Portable  Result Date: 02/04/2024  Moderate pulmonary edema. Nitin Kumar, MD 02/04/2024 11:37 AM      Discharge Medications and Documented Allergies:        Discharge Medication List        Taking      amLODIPine  10 MG tablet  Dose: 10 mg  Commonly known as: NORVASC   For: High Blood Pressure  Take 1 tablet (10 mg) by mouth once daily     anastrozole  1 MG tablet  Dose: 1 mg  Commonly known as: ARIMIDEX   For: Early Cancer of the Breast in Postmenopausal Women  Take 1 tablet (1 mg total) by mouth every morning     aspirin  81 MG chewable tablet  Dose: 81 mg  Chew 1 tablet (81 mg total) by mouth daily     lisinopril  40 MG tablet  Dose: 40 mg  Commonly known as: ZESTRIL   For: High Blood Pressure  Take 1 tablet (40 mg) by mouth once daily     meclizine  12.5 MG tablet  Dose: 12.5 mg  What changed:   medication strength  how much to take  Commonly known as: ANTIVERT   For: Dizzy  Take 1 tablet (12.5 mg) by mouth 3 (three) times daily as needed for Dizziness     multivitamin with minerals tablet  Dose: 1 tablet  Take 1 tablet by mouth once every morning     pantoprazole  40 MG tablet  Dose: 40 mg  What changed: when to take this  Commonly known as: PROTONIX   For: Gastroesophageal Reflux Disease  Take 1 tablet (40 mg) by mouth every morning before breakfast     rosuvastatin  40 MG tablet  Dose: 40 mg  Commonly known as: CRESTOR   For: High Amount of Fats in the Blood  Take 1 tablet (40 mg) by mouth once daily            STOP taking these medications      celecoxib  200 MG capsule  Commonly known as: CeleBREX      furosemide  10 MG/ML solution  Commonly known as: LASIX      IRON PO              Allergies[1]         Disposition:     Discharge Disposition: Home with  family    Discharge Code Status: Full Code    Patient Emergency Contact:  Extended Emergency Contact Information  Primary Emergency Contact:  AMOAH,Theophilus  Mobile Phone: [phone]  Relation: Son  Preferred language: English  Interpreter needed? No  Secondary Emergency Contact: Harris,Gloria  Mobile Phone: [phone]  Relation: Cousin    Discharge Instructions:     Patient Instructions: (See AVS for full details)      Outpatient Follow-Up Plan:     Instructions for PCP:      Appointments:   Follow-up Information       [facility] Follow up.    Specialty: Home Health Services  Contact information:  [address]  [place]   [address]  [phone]             Franchot Corean BIRCH, MD. Schedule an appointment as soon as possible for a visit.    Specialty: Internal Medicine  Contact information:  [address]  [place] [address]  [phone]                             Pending Labs, Microbiology, and Pathology:  Unresulted Labs       Procedure . . . Date/Time    Urine Elnor Culture Hold Tube [[id number]] Collected: 02/09/24 1148    Specimen: Urine, Clean Catch Updated: 02/09/24 1154            Attestations and Signatures:     Minutes spent coordinating discharge and reviewing discharge plan: 45 minutes; additionally discussed with son over phone    Signed by: Dorn CHRISTELLA Kubas, MD           [1]   Allergies  Allergen Reactions    Morphine  Itching    Morphine  And Codeine  Itching and Nausea And Vomiting     dizzy

## 2024-02-19 NOTE — Progress Notes (Signed)
 Name: Emily [name]    ### Patient Details  Date of Birth: Sep 04, 1946  MRN: [id number]    ### Encounter Details  Arrival Date: 02/04/2024 10:57 AM EDT  Discharge Date: 02/09/2024 04:15 PM EDT  Encounter ID: [id number] TCM5/06/2024   4:15:00PM    ### Related interaction  [facility] - TCM V2 Post Discharge Outreach (Post Discharge TCM V2 Outreach 1) ([email or web address])    ### Required Interventions and Feedback     Call Status         Call Status:     No Attempt (edited by TF on 02/19/2024 12:18 PM EDT)    Comments::     Patient received an automated post-discharge outreach call. Incomplete (1st attempt), Incomplete (2nd attempt), Incomplete (3rd attempt). Unable to reach the patient, no further action/outreach needed at this time. (edited by TF on 02/19/2024 12:19 PM EDT)    Verneita Leverne) F.  Care Coordinator, Ambulatory Care Management  [facility]   [address] [place], [place] [address]

## 2024-02-27 ENCOUNTER — Other Ambulatory Visit: Payer: Self-pay | Admitting: Internal Medicine

## 2024-02-27 DIAGNOSIS — Z780 Asymptomatic menopausal state: Secondary | ICD-10-CM

## 2024-02-27 DIAGNOSIS — Z1239 Encounter for other screening for malignant neoplasm of breast: Secondary | ICD-10-CM

## 2024-03-11 ENCOUNTER — Ambulatory Visit
Admission: RE | Admit: 2024-03-11 | Discharge: 2024-03-11 | Disposition: A | Discharge status: Home / Self Care | Source: Ambulatory Visit | Attending: Internal Medicine | Admitting: Internal Medicine

## 2024-03-11 ENCOUNTER — Other Ambulatory Visit: Payer: Self-pay | Admitting: Internal Medicine

## 2024-03-11 DIAGNOSIS — Z780 Asymptomatic menopausal state: Secondary | ICD-10-CM

## 2024-03-11 DIAGNOSIS — Z1239 Encounter for other screening for malignant neoplasm of breast: Secondary | ICD-10-CM

## 2024-03-11 DIAGNOSIS — M810 Age-related osteoporosis without current pathological fracture: Secondary | ICD-10-CM | POA: Insufficient documentation

## 2024-03-11 DIAGNOSIS — Z1231 Encounter for screening mammogram for malignant neoplasm of breast: Secondary | ICD-10-CM | POA: Insufficient documentation

## 2024-04-11 ENCOUNTER — Ambulatory Visit
Admission: RE | Admit: 2024-04-11 | Discharge: 2024-04-11 | Disposition: A | Discharge status: Home / Self Care | Source: Ambulatory Visit | Attending: Internal Medicine | Admitting: Internal Medicine

## 2024-04-11 ENCOUNTER — Other Ambulatory Visit: Payer: Self-pay | Admitting: Internal Medicine

## 2024-04-11 DIAGNOSIS — M545 Low back pain, unspecified: Secondary | ICD-10-CM

## 2024-04-19 ENCOUNTER — Other Ambulatory Visit: Payer: Self-pay | Admitting: Internal Medicine

## 2024-04-19 DIAGNOSIS — R2243 Localized swelling, mass and lump, lower limb, bilateral: Secondary | ICD-10-CM

## 2024-05-16 NOTE — Progress Notes (Signed)
 Nephrology clinic initial evaluation note:    Reason for Consultation: AKI on CKD    History of Present illness:    Dear Tobey Brasil, MD ,    Thank you for allowing me to participate in the care of Emily [name]  whom I had the pleasure of meeting today in the office for a nephrology consultation.    Emily [name] is a 78 y.o. female with past medical history of HTN, HLD and mild CKD stage IIIA who presents for initial evaluation of a recent rise in Cr due to AKI , baseline Cr 1.0, most recent Cr at 1.5    The patient has no active complaints except for chronic back pain that she takes tylenol  and sometimes motrin  for      The following chart segments were reviewed:   Allergies  Meds  Problems  Med Hx  Surg Hx  Fam Hx          Current medications:    Current Outpatient Medications:   .  amLODIPine  (NORVASC ) 5 MG tablet, Take 1 tablet (5 mg total) by mouth 1 (one) time each day, Disp: 90 tablet, Rfl: 1  .  anastrozole  (ARIMIDEX ) 1 MG chemo tablet, Take 1 mg by mouth 1 (one) time each day, Disp: , Rfl:   .  Apoaequorin (PREVAGEN PO), Take by mouth, Disp: , Rfl:   .  aspirin  81 MG chewable tablet, Chew 81 mg 1 (one) time each day, Disp: , Rfl:   .  ibandronate (BONIVA) 150 MG tablet, Take 150 mg by mouth every 30 (thirty) days Take in morning with full glass of water  on an empty stomach. No food, drink, meds, or lying down for 60 minutes after., Disp: , Rfl:   .  levothyroxine  (SYNTHROID , LEVOTHROID) 25 MCG tablet, Take 25 mcg by mouth 1 (one) time each day, Disp: , Rfl:   .  lisinopril  40 MG tablet, Take 40 mg by mouth 1 (one) time each day, Disp: , Rfl:   .  rosuvastatin  (CRESTOR ) 40 MG tablet, Take 40 mg by mouth every night, Disp: , Rfl:   .  thiamine  (VITAMIN B-1) 100 MG tablet, Take 100 mg by mouth 1 (one) time each day, Disp: , Rfl:     Review of systems:    No fever  No cough   No chest pain   No shortness of breath or orthopnea  No abdominal pain, vomiting or diarrhea.  No urinary symptoms.  No  headache or lightheadedness.  No new joint pain, no new rash  All other systems reviewed and found negative for new problems.     Past medical history:  Emily [name]  has a past medical history of Cancer (HCC), Hypertension, Osteopenia, and Osteoporosis. she  has a past surgical history that includes Breast surgery.    Past surgical history:  Past Surgical History:   Procedure Laterality Date   . BREAST SURGERY          Family history  family history is not on file.    Social history:   reports that she has never smoked. She has never used smokeless tobacco. She reports current alcohol use. She reports that she does not use drugs.       Physical exam:  BP 90/56 (BP Location: Left upper arm, Patient Position: Sitting, BP Cuff Size: Adult)   Pulse 59   Resp 16   Ht 5' 4 (1.626 m)   Wt 147 lb (66.7 kg)  SpO2 95%   BMI 25.23 kg/m   Wt Readings from Last 4 Encounters:   05/16/24 147 lb (66.7 kg)         General: Alert and in no acute distress.  Neck: No jugular venous distention.  Chest: Clear to auscultation bilaterally. No added sounds.  Heart: Regular rate and rhythm, normal S1 and S2.  Abdomen: Soft, nontender, and nondistended. Bowel sounds normal.  Extremities: No edema    Skin: Intact; no rash.    Relevant labs:  Labs   Lab Units 04/11/24  0000 02/07/24  0557 02/06/24  9367 02/05/24  0653 02/04/24  1103 12/23/23  1341 12/08/23  0000   SODIUM  142 143 141 142 141   < > 142   POTASSIUM  4.7 4.4 4.6 4.6 4.1   < > 4.7   CHLORIDE  101.0 101 105 107 108   < > 103.0   CO2 mmol/L 21 29 31* 26 26   < > 26   BUN mg/dL 18 13 15 12 16    < > 10   CREATININE mg/dL 8.42* 1.0 0.9 0.9 0.9   < > 0.94   EGFRAFR  34  --   --   --   --   --  62   TOTAL PROTEIN G/DL 7.6 6.9 6.5 6.7 6.7   < > 6.6   ALBUMIN  g/dL 4.7 3.8 3.7 3.8 3.7   < > 4.3   GLOBULIN, TOTAL g/dL 2.9 3.1 2.8 2.9 3.0   < > 2.3   AST U/L 25 26 22 23 26    < > 22   ALT U/L 14 15 10 15 12    < > 15   BILIRUBIN TOTAL MG/DL 9.79 0.3 0.3 0.2 0.2   < > 0.20   WBC AUTO  10*3/ML 7.7 6.77 5.97 7.31 5.55   < > 5.5   HEMOGLOBIN  11.3* 12.2 11.1* 11.6 11.1*   < > 11.1*   HEMATOCRIT  38.0 40.2 35.6 37.3 36.6   < > 37.2   PLATELETS AUTO 10*3/UL 278 285 285 275 269   < > 284   HEMOGLOBIN A1C %  --   --   --   --  6.0*  --   --    CHOLESTEROL TOTAL   --   --   --   --   --   --  153   HDL mg/dL  --   --   --   --   --   --  68   LDL CALC   --   --   --   --   --   --  73   TRIGLYCERIDES   --   --   --   --   --   --  57    < > = values in this interval not displayed.         Bone Mineral   Lab Units 04/11/24  0000 02/07/24  0557 02/06/24  0632 02/05/24  0653   CALCIUM  mg/dL 9.8 9.3 9.2 8.9   PHOSPHORUS mg/dL  --  4.2 3.6 4.4     Urine   Lab Units 04/11/24  0000 02/07/24  0557 02/06/24  0632 02/05/24  0653   HEMOGLOBIN  11.3* 12.2 11.1* 11.6       No results found for: PHUR, BLOODUR, URMICRO    Assessment:  Acute kidney injury likely due to ATN, Cr at 1.5 now  Chronic kidney  disease stage IIIA due to age related atherosclerotic changes and HTN nephrosclerosis with baseline Cr 1.0   Hypertension with CKD: Currently on lisinopril  and amlodipine , low BP today    Recommendations:  - Decrease amlodipine  to 5 mg qd  - Patient is currently on lisinopril  which known to slow down the progression of CKD.  - BP  goal <130/80   - Low salt diet < 2 gm/day  - Avoid to use NSAIDs (Advil /Ibuprofen /Naproxen ) for pain was discussed with the patient and was instructed to use tylenol  PRN for pain.      Return in about 3 months (around 08/16/2024).    Orders Placed This Encounter   . CBC   . Renal function panel   . Uric acid   . Urinalysis with microscopic   . Urine Albumin  / Creatinine Ratio   . Urine Protein / creatinine ratio   . amLODIPine  (NORVASC ) 5 MG tablet        Zaid A Hammoodi, MD  05/16/2024 11:44 AM EDT    Visit our website: [email or web address]

## 2024-05-28 ENCOUNTER — Observation Stay

## 2024-05-28 ENCOUNTER — Observation Stay
Admission: EM | Admit: 2024-05-28 | Discharge: 2024-05-29 | Disposition: A | Discharge status: Home / Self Care | Attending: Internal Medicine | Admitting: Internal Medicine

## 2024-05-28 ENCOUNTER — Emergency Department

## 2024-05-28 DIAGNOSIS — R531 Weakness: Secondary | ICD-10-CM | POA: Insufficient documentation

## 2024-05-28 DIAGNOSIS — Z6836 Body mass index (BMI) 36.0-36.9, adult: Secondary | ICD-10-CM | POA: Insufficient documentation

## 2024-05-28 DIAGNOSIS — I639 Cerebral infarction, unspecified: Secondary | ICD-10-CM

## 2024-05-28 DIAGNOSIS — R2689 Other abnormalities of gait and mobility: Secondary | ICD-10-CM | POA: Insufficient documentation

## 2024-05-28 DIAGNOSIS — R200 Anesthesia of skin: Principal | ICD-10-CM | POA: Diagnosis present

## 2024-05-28 DIAGNOSIS — Z7982 Long term (current) use of aspirin: Secondary | ICD-10-CM | POA: Insufficient documentation

## 2024-05-28 DIAGNOSIS — E785 Hyperlipidemia, unspecified: Secondary | ICD-10-CM | POA: Insufficient documentation

## 2024-05-28 DIAGNOSIS — E66812 Obesity, class 2: Secondary | ICD-10-CM | POA: Insufficient documentation

## 2024-05-28 DIAGNOSIS — E860 Dehydration: Secondary | ICD-10-CM | POA: Insufficient documentation

## 2024-05-28 DIAGNOSIS — I10 Essential (primary) hypertension: Secondary | ICD-10-CM | POA: Insufficient documentation

## 2024-05-28 DIAGNOSIS — G459 Transient cerebral ischemic attack, unspecified: Principal | ICD-10-CM | POA: Insufficient documentation

## 2024-05-28 DIAGNOSIS — Z79899 Other long term (current) drug therapy: Secondary | ICD-10-CM | POA: Insufficient documentation

## 2024-05-28 DIAGNOSIS — Z853 Personal history of malignant neoplasm of breast: Secondary | ICD-10-CM | POA: Insufficient documentation

## 2024-05-28 DIAGNOSIS — I6782 Cerebral ischemia: Secondary | ICD-10-CM | POA: Insufficient documentation

## 2024-05-28 DIAGNOSIS — R1110 Vomiting, unspecified: Secondary | ICD-10-CM | POA: Insufficient documentation

## 2024-05-28 LAB — LAB USE ONLY - CBC WITH DIFFERENTIAL
Absolute Basophils: 0.02 x10 3/uL (ref 0.00–0.08)
Absolute Basophils: 0.02 x10 3/uL (ref 0.00–0.08)
Absolute Eosinophils: 0.08 x10 3/uL (ref 0.00–0.44)
Absolute Eosinophils: 0.07 x10 3/uL (ref 0.00–0.44)
Absolute Immature Granulocytes: 0.01 x10 3/uL (ref 0.00–0.07)
Absolute Immature Granulocytes: 0.01 x10 3/uL (ref 0.00–0.07)
Absolute Lymphocytes: 1.59 x10 3/uL (ref 0.42–3.22)
Absolute Lymphocytes: 2.21 x10 3/uL (ref 0.42–3.22)
Absolute Monocytes: 0.57 x10 3/uL (ref 0.21–0.85)
Absolute Monocytes: 0.63 x10 3/uL (ref 0.21–0.85)
Absolute Neutrophils: 3.23 x10 3/uL (ref 1.10–6.33)
Absolute Neutrophils: 4.19 x10 3/uL (ref 1.10–6.33)
Absolute nRBC: 0 x10 3/uL (ref <=0.00)
Absolute nRBC: 0 x10 3/uL (ref <=0.00)
Basophils %: 0.4 %
Basophils %: 0.3 %
Eosinophils %: 1.5 %
Eosinophils %: 1 %
Hematocrit: 28.6 % — ABNORMAL LOW (ref 34.7–43.7)
Hematocrit: 34.5 % — ABNORMAL LOW (ref 34.7–43.7)
Hemoglobin: 8.7 g/dL — ABNORMAL LOW (ref 11.4–14.8)
Hemoglobin: 10.7 g/dL — ABNORMAL LOW (ref 11.4–14.8)
Immature Granulocytes %: 0.2 %
Immature Granulocytes %: 0.1 %
Lymphocytes %: 28.9 %
Lymphocytes %: 31 %
MCH: 27 pg (ref 25.1–33.5)
MCH: 27.3 pg (ref 25.1–33.5)
MCHC: 30.4 g/dL — ABNORMAL LOW (ref 31.5–35.8)
MCHC: 31 g/dL — ABNORMAL LOW (ref 31.5–35.8)
MCV: 88.8 fL (ref 78.0–96.0)
MCV: 88 fL (ref 78.0–96.0)
MPV: 11 fL (ref 8.9–12.5)
MPV: 11.7 fL (ref 8.9–12.5)
Monocytes %: 10.4 %
Monocytes %: 8.8 %
Neutrophils %: 58.6 %
Neutrophils %: 58.8 %
Platelet Count: 252 x10 3/uL (ref 142–346)
Platelet Count: 297 x10 3/uL (ref 142–346)
Preliminary Absolute Neutrophil Count: 3.23 x10 3/uL (ref 1.10–6.33)
Preliminary Absolute Neutrophil Count: 4.19 x10 3/uL (ref 1.10–6.33)
RBC: 3.22 x10 6/uL — ABNORMAL LOW (ref 3.90–5.10)
RBC: 3.92 x10 6/uL (ref 3.90–5.10)
RDW: 14 % (ref 11–15)
RDW: 14 % (ref 11–15)
WBC: 5.5 x10 3/uL (ref 3.10–9.50)
WBC: 7.13 x10 3/uL (ref 3.10–9.50)
nRBC %: 0 /100{WBCs} (ref <=0.0)
nRBC %: 0 /100{WBCs} (ref <=0.0)

## 2024-05-28 LAB — COMPREHENSIVE METABOLIC PANEL
ALT: 22 U/L (ref <=55)
ALT: 28 U/L (ref <=55)
AST (SGOT): 31 U/L (ref <=41)
AST (SGOT): 36 U/L (ref <=41)
Albumin/Globulin Ratio: 1.4 (ref 0.9–2.2)
Albumin/Globulin Ratio: 1.3 (ref 0.9–2.2)
Albumin: 3.4 g/dL — ABNORMAL LOW (ref 3.5–5.0)
Albumin: 4.2 g/dL (ref 3.5–5.0)
Alkaline Phosphatase: 61 U/L (ref 37–117)
Alkaline Phosphatase: 79 U/L (ref 37–117)
Anion Gap: 7 (ref 5.0–15.0)
Anion Gap: 13 (ref 5.0–15.0)
BUN: 28 mg/dL — ABNORMAL HIGH (ref 7–21)
BUN: 21 mg/dL (ref 7–21)
Bilirubin, Total: 0.2 mg/dL (ref 0.2–1.2)
Bilirubin, Total: 0.3 mg/dL (ref 0.2–1.2)
CO2: 27 meq/L (ref 17–29)
CO2: 26 meq/L (ref 17–29)
Calcium: 8.3 mg/dL (ref 7.9–10.2)
Calcium: 9.6 mg/dL (ref 7.9–10.2)
Chloride: 105 meq/L (ref 99–111)
Chloride: 107 meq/L (ref 99–111)
Creatinine: 1.5 mg/dL — ABNORMAL HIGH (ref 0.4–1.0)
Creatinine: 1.2 mg/dL — ABNORMAL HIGH (ref 0.4–1.0)
GFR: 34.8 mL/min/1.73 m2 — ABNORMAL LOW (ref >=60.0)
GFR: 45.5 mL/min/1.73 m2 — ABNORMAL LOW (ref >=60.0)
Globulin: 2.4 g/dL (ref 2.0–3.6)
Globulin: 3.2 g/dL (ref 2.0–3.6)
Glucose: 93 mg/dL (ref 70–100)
Glucose: 87 mg/dL (ref 70–100)
Potassium: 4.3 meq/L (ref 3.5–5.3)
Potassium: 4.2 meq/L (ref 3.5–5.3)
Protein, Total: 5.8 g/dL — ABNORMAL LOW (ref 6.0–8.3)
Protein, Total: 7.4 g/dL (ref 6.0–8.3)
Sodium: 139 meq/L (ref 135–145)
Sodium: 146 meq/L — ABNORMAL HIGH (ref 135–145)

## 2024-05-28 LAB — ECG 12-LEAD
Atrial Rate: 80 {beats}/min
IHS MUSE NARRATIVE AND IMPRESSION: NORMAL
P Axis: 54 degrees
P-R Interval: 200 ms
Q-T Interval: 392 ms
QRS Duration: 80 ms
QTC Calculation (Bezet): 452 ms
R Axis: 32 degrees
T Axis: 21 degrees
Ventricular Rate: 80 {beats}/min

## 2024-05-28 LAB — PT/INR
INR: 1 (ref 0.9–1.1)
PT: 11.4 s (ref 10.1–12.9)

## 2024-05-28 LAB — APTT: PTT: 34 s (ref 27–39)

## 2024-05-28 LAB — WHOLE BLOOD GLUCOSE POCT: Whole Blood Glucose POCT: 80 mg/dL (ref 70–100)

## 2024-05-28 LAB — HIGH SENSITIVITY TROPONIN-I: hs Troponin: <2.7 ng/L (ref <=14.0)

## 2024-05-28 MED ORDER — MECLIZINE HCL 12.5 MG PO TABS
12.5000 mg | ORAL_TABLET | Freq: Three times a day (TID) | ORAL | Status: DC | PRN
Start: 2024-05-28 — End: 2024-05-29

## 2024-05-28 MED ORDER — SALINE SPRAY 0.65 % NA SOLN
2.0000 | NASAL | Status: DC | PRN
Start: 2024-05-28 — End: 2024-05-29

## 2024-05-28 MED ORDER — ROSUVASTATIN CALCIUM 10 MG PO TABS
40.0000 mg | ORAL_TABLET | Freq: Every day | ORAL | Status: DC
Start: 2024-05-28 — End: 2024-05-29
  Administered 2024-05-28 – 2024-05-29 (×2): 40 mg via ORAL
  Filled 2024-05-28 (×2): qty 4

## 2024-05-28 MED ORDER — BENZONATATE 100 MG PO CAPS
100.0000 mg | ORAL_CAPSULE | Freq: Three times a day (TID) | ORAL | Status: DC | PRN
Start: 2024-05-28 — End: 2024-05-29

## 2024-05-28 MED ORDER — ATORVASTATIN CALCIUM 40 MG PO TABS
40.0000 mg | ORAL_TABLET | Freq: Every evening | ORAL | Status: DC
Start: 2024-05-28 — End: 2024-05-28

## 2024-05-28 MED ORDER — IOHEXOL 350 MG/ML IV SOLN
125.0000 mL | Freq: Once | INTRAVENOUS | Status: AC | PRN
Start: 2024-05-28 — End: 2024-05-28
  Administered 2024-05-28: 125 mL via INTRAVENOUS
  Filled 2024-05-28: qty 150

## 2024-05-28 MED ORDER — NALOXONE HCL 0.4 MG/ML IJ SOLN (WRAP)
0.2000 mg | INTRAMUSCULAR | Status: DC | PRN
Start: 2024-05-28 — End: 2024-05-29

## 2024-05-28 MED ORDER — HEPARIN SODIUM (PORCINE) 5000 UNIT/ML IJ SOLN
5000.0000 [IU] | Freq: Three times a day (TID) | INTRAMUSCULAR | Status: DC
Start: 2024-05-28 — End: 2024-05-29
  Administered 2024-05-28 – 2024-05-29 (×4): 5000 [IU] via SUBCUTANEOUS
  Filled 2024-05-28 (×3): qty 1

## 2024-05-28 MED ORDER — GLUCAGON 1 MG IJ SOLR (WRAP)
1.0000 mg | INTRAMUSCULAR | Status: DC | PRN
Start: 2024-05-28 — End: 2024-05-29

## 2024-05-28 MED ORDER — LABETALOL HCL 5 MG/ML IV SOLN (WRAP)
10.0000 mg | INTRAVENOUS | Status: DC | PRN
Start: 2024-05-28 — End: 2024-05-29

## 2024-05-28 MED ORDER — ACETAMINOPHEN 500 MG PO TABS
500.0000 mg | ORAL_TABLET | ORAL | Status: DC | PRN
Start: 2024-05-28 — End: 2024-05-29
  Administered 2024-05-28: 500 mg via ORAL
  Filled 2024-05-28: qty 1

## 2024-05-28 MED ORDER — MAGNESIUM SULFATE IN D5W 1-5 GM/100ML-% IV SOLN
1.0000 g | INTRAVENOUS | Status: DC | PRN
Start: 2024-05-28 — End: 2024-05-29

## 2024-05-28 MED ORDER — GLUCOSE 40 % PO GEL (WRAP)
15.0000 g | ORAL | Status: DC | PRN
Start: 2024-05-28 — End: 2024-05-29

## 2024-05-28 MED ORDER — HYDRALAZINE HCL 20 MG/ML IJ SOLN
10.0000 mg | INTRAMUSCULAR | Status: DC | PRN
Start: 2024-05-28 — End: 2024-05-29

## 2024-05-28 MED ORDER — DEXTROSE 50 % IV SOLN
12.5000 g | INTRAVENOUS | Status: DC | PRN
Start: 2024-05-28 — End: 2024-05-29

## 2024-05-28 MED ORDER — POTASSIUM CHLORIDE 20 MEQ PO PACK
0.0000 meq | PACK | ORAL | Status: DC | PRN
Start: 2024-05-28 — End: 2024-05-29

## 2024-05-28 MED ORDER — POTASSIUM & SODIUM PHOSPHATES 280-160-250 MG PO PACK
2.0000 | PACK | ORAL | Status: DC | PRN
Start: 2024-05-28 — End: 2024-05-29

## 2024-05-28 MED ORDER — LACTATED RINGERS IV BOLUS
1000.0000 mL | Freq: Once | INTRAVENOUS | Status: DC
Start: 2024-05-28 — End: 2024-05-29
  Filled 2024-05-28: qty 1000

## 2024-05-28 MED ORDER — CARBOXYMETHYLCELLULOSE SOD PF 0.5 % OP SOLN
1.0000 [drp] | Freq: Three times a day (TID) | OPHTHALMIC | Status: DC | PRN
Start: 2024-05-28 — End: 2024-05-29

## 2024-05-28 MED ORDER — POTASSIUM CHLORIDE CRYS ER 20 MEQ PO TBCR
0.0000 meq | EXTENDED_RELEASE_TABLET | ORAL | Status: DC | PRN
Start: 2024-05-28 — End: 2024-05-29

## 2024-05-28 MED ORDER — POTASSIUM CHLORIDE 10 MEQ/100ML IV SOLN (WRAP)
10.0000 meq | INTRAVENOUS | Status: DC | PRN
Start: 2024-05-28 — End: 2024-05-29

## 2024-05-28 MED ORDER — DEXTROSE 10 % IV BOLUS
12.5000 g | INTRAVENOUS | Status: DC | PRN
Start: 2024-05-28 — End: 2024-05-29

## 2024-05-28 MED ORDER — PANTOPRAZOLE SODIUM 40 MG PO TBEC
40.0000 mg | DELAYED_RELEASE_TABLET | Freq: Every morning | ORAL | Status: DC
Start: 2024-05-28 — End: 2024-05-29
  Administered 2024-05-28 – 2024-05-29 (×2): 40 mg via ORAL
  Filled 2024-05-28 (×2): qty 1

## 2024-05-28 MED ORDER — BENZOCAINE-MENTHOL MT LOZG (WRAP)
1.0000 | LOZENGE | OROMUCOSAL | Status: DC | PRN
Start: 2024-05-28 — End: 2024-05-29

## 2024-05-28 MED ORDER — MELATONIN 3 MG PO TABS
3.0000 mg | ORAL_TABLET | Freq: Every evening | ORAL | Status: DC | PRN
Start: 2024-05-28 — End: 2024-05-29

## 2024-05-28 NOTE — ED Notes (Signed)
Bed: E09  Expected date:   Expected time:   Means of arrival:   Comments:  Stroke alert

## 2024-05-28 NOTE — ED Provider Notes (Addendum)
 [facility]  Emergency Department Physician Note      Diagnosis/Disposition     ED Disposition:  Observation    ED Diagnosis:  Leg numbness    Current Discharge Medication List          History of Present Illness     Chief Complaint: Generalized weakness and Dizziness (/)       Emily [name] is a 78 y.o. female with a PMH of breast cancer, HTN, HLD presenting with dizziness.    Patient said last night she had a HA and felt weak. She went to bed at 10PM and then when she woke up today, she again felt very weak and dizzy. She had an episode of vomiting. She said that she felt her right leg was numb. She denies this happening before. Patient states she does follow with oncology and is on hormone therapy but does not remember the name of her oncologist.     Severity of CC: severe  History obtained ab:Ejupzwu  Translator used: No      Review of Systems     ROS:   CONSTITUTIONAL:  Weakness  HEENT: HA  EYES: no eye reddness  CARDS: normal heart rate  RESP:  normal work of breathing  GI: normal/unchanged bowel habits  GU: normal urineoutput  SKIN: no rash  NEURO: right leg numbness  HEME: no abnormal bruising       Physical Exam     ED Triage Vitals [05/28/24 0753]   Encounter Vitals Group      BP 120/64      Girls Systolic BP Percentile       Girls Diastolic BP Percentile       Boys Systolic BP Percentile       Boys Diastolic BP Percentile       Heart Rate 82      Resp Rate 17      Temp 97.9 F (36.6 C)      Temp src Oral      SpO2 100 %      Weight       Height       Head Circumference       Peak Flow       Pain Score       Pain Loc       Pain Education       Exclude from Growth Chart       Constitutional: No acute distress  HENT:   Head: Normocephalic and atraumatic.   Eyes: Conjunctivae are normal. PERRL. No scleral icterus.   Cardiovascular: Normal rate and regular rhythm. No murmur heard.  Pulmonary: Clear to auscultation b/l. No respiratory distress.   Abdominal: Soft, non-tender,  non-distended  Musculoskeletal: Normal ROM of extremities. No edema.   Neurological: Awake, alert, motor/sensory grossly intact.  Skin: Skin is warm and dry.   Psychiatric: Behavior is normal. Thought content normal.        Medical Decision Making            Emily [name] is a 78 y.o. female presenting with dizziness, weakness.    Brief exam prior to CT without appreciable facial droop. Is able to raise both arms and legs up without noticeable weakness. Does report reduced sensation along the right leg compared to the left. Clearly not a lytic candidate given LKN being > 4.5 hours. Is within the thrombectomy window so would obtain [facility] CTA CTP. Will re-examine when back from CT for for complete  neuro exam. Other consideration includes brain mets given brain cancer (though patient states her disease has been stable for many years) vs metabolic derangement vs ACS.    Plan for labs, imaging.      Acute illness/injury with risk to life or bodily function (based on differential diagnosis or evaluation)      ED Course as of 05/28/24 1450   Tue May 28, 2024   0844 Reassessed. Do not appreciate any drift when raising both arms. Able to keep arms/legs off the bed. Remains reporting reduced sensation along right leg. NIHSS of 1 for numbness/sensory deficit [GR]   9094 Spoke with Dr. Joni from neurology. They have been consulted on the patient for dizziness, vomiting, right leg numbness     [GR]   0909 CTA/CTP negative [GR]   0919 Mild dehydration based on Cr. 3.5 drop in hemoglobin from prior 3 months ago but patient denies any any bleeding or dark stool.  [GR]   [phone] Patient has been signed out to Dr. Irl from the admitting team for stroke rule out      [GR]      ED Course User Index  [GR] Garon Eden, MD           Evaluation    External Records Reviewed: Inpatient records  Date of Records reviewed: 02/09/24      Vital Signs: Reviewed the patient's vital signs.   Nursing Notes: Reviewed and utilized available  nursing notes.    CARDIAC STUDIES    The following cardiac studies were independently interpreted by myself    EKG Interpretation:  Date of EKG: 05/28/24  Time interrupted 859  STEMI vs No STEMI: No STEMI  Rate: 80  Rhythm: Sinus  Intervals: Narrow  Blocks: No blocks  PROCEDURES    Critical Care    Performed by: Garon Eden, MD  Authorized by: Garon Eden, MD    Critical care provider statement:     Critical care time (minutes):  45    Critical care time was exclusive of:  Separately billable procedures and treating other patients      EMERGENCY IMAGING STUDIES        RADIOLOGY IMAGING STUDIES      CTA  Head & Neck   Final Result      1.Normal CT perfusion examination of the head. There is no territorial   perfusion abnormality.   2.There is no stenosis of the proximal right internal carotid artery based   on NASCET criteria.   3.There is no stenosis of the proximal left internal carotid artery based   on NASCET criteria.   4.The vertebrobasilar arterial system is patent.   5.Normal intracranial CT angiogram. There is no proximal intracranial large   vessel occlusion.      Milan Arne Jumbo, MD   05/28/2024 9:04 AM      CT Perfusion Brain   Final Result      1.Normal CT perfusion examination of the head. There is no territorial   perfusion abnormality.   2.There is no stenosis of the proximal right internal carotid artery based   on NASCET criteria.   3.There is no stenosis of the proximal left internal carotid artery based   on NASCET criteria.   4.The vertebrobasilar arterial system is patent.   5.Normal intracranial CT angiogram. There is no proximal intracranial large   vessel occlusion.      Milan Arne Jumbo, MD   05/28/2024 9:04 AM      CT  Head WO Contrast   Final Result       1.Mild chronic small vessel ischemic disease.      Doylene Patee, MD   05/28/2024 8:09 AM          EMERGENCY DEPT. MEDICATIONS      ED Medication Orders (From admission, onward)      Start Ordered     Status Ordering  Provider    05/28/24 0930 05/28/24 0929  lactated ringers  bolus 1,000 mL  Once        Route: Intravenous  Ordered Dose: 1,000 mL       Acknowledged Christophe Rising    05/28/24 0831 05/28/24 0831  iohexol  (OMNIPAQUE ) 350 MG/ML injection 125 mL  IMG once as needed        Route: Intravenous  Ordered Dose: 125 mL       Last MAR action: Imaging Agent Given Ahmadou Bolz            LABORATORY RESULTS    Ordered and independently interpreted AVAILABLE laboratory tests.   Results for orders placed or performed during the hospital encounter of 05/28/24   Whole Blood Glucose POCT    Collection Time: 05/28/24  7:54 AM   Result Value Ref Range    Whole Blood Glucose POCT 80 70 - 100 mg/dL   Comprehensive Metabolic Panel    Collection Time: 05/28/24  8:48 AM   Result Value Ref Range    Glucose 93 70 - 100 mg/dL    BUN 28 (H) 7 - 21 mg/dL    Creatinine 1.5 (H) 0.4 - 1.0 mg/dL    Sodium 860 864 - 854 mEq/L    Potassium 4.3 3.5 - 5.3 mEq/L    Chloride 105 99 - 111 mEq/L    CO2 27 17 - 29 mEq/L    Calcium  8.3 7.9 - 10.2 mg/dL    Anion Gap 7.0 5.0 - 15.0    GFR 34.8 (L) >=60.0 mL/min/1.73 m2    AST (SGOT) 31 <=41 U/L    ALT 22 <=55 U/L    Alkaline Phosphatase 61 37 - 117 U/L    Albumin  3.4 (L) 3.5 - 5.0 g/dL    Protein, Total 5.8 (L) 6.0 - 8.3 g/dL    Globulin 2.4 2.0 - 3.6 g/dL    Albumin /Globulin Ratio 1.4 0.9 - 2.2    Bilirubin, Total 0.2 0.2 - 1.2 mg/dL   PT/INR    Collection Time: 05/28/24  8:48 AM   Result Value Ref Range    PT 11.4 10.1 - 12.9 sec    INR 1.0 0.9 - 1.1   APTT    Collection Time: 05/28/24  8:48 AM   Result Value Ref Range    PTT 34 27 - 39 sec   High Sensitivity Troponin-I    Collection Time: 05/28/24  8:48 AM   Result Value Ref Range    hs Troponin <2.7 <=14.0 ng/L   CBC with Differential (Component)    Collection Time: 05/28/24  8:48 AM   Result Value Ref Range    WBC 5.50 3.10 - 9.50 x10 3/uL    Hemoglobin 8.7 (L) 11.4 - 14.8 g/dL    Hematocrit 71.3 (L) 34.7 - 43.7 %    Platelet Count 252 142 - 346 x10 3/uL     MPV 11.0 8.9 - 12.5 fL    RBC 3.22 (L) 3.90 - 5.10 x10 6/uL    MCV 88.8 78.0 - 96.0 fL    MCH 27.0 25.1 -  33.5 pg    MCHC 30.4 (L) 31.5 - 35.8 g/dL    RDW 14 11 - 15 %    nRBC % 0.0 <=0.0 /100 WBC    Absolute nRBC 0.00 <=0.00 x10 3/uL    Preliminary Absolute Neutrophil Count 3.23 1.10 - 6.33 x10 3/uL    Neutrophils % 58.6 Not Established %    Lymphocytes % 28.9 Not Established %    Monocytes % 10.4 Not Established %    Eosinophils % 1.5 Not Established %    Basophils % 0.4 Not Established %    Immature Granulocytes % 0.2 Not Established %    Absolute Neutrophils 3.23 1.10 - 6.33 x10 3/uL    Absolute Lymphocytes 1.59 0.42 - 3.22 x10 3/uL    Absolute Monocytes 0.57 0.21 - 0.85 x10 3/uL    Absolute Eosinophils 0.08 0.00 - 0.44 x10 3/uL    Absolute Basophils 0.02 0.00 - 0.08 x10 3/uL    Absolute Immature Granulocytes 0.01 0.00 - 0.07 x10 3/uL   ECG 12 lead    Collection Time: 05/28/24  8:57 AM   Result Value Ref Range    Ventricular Rate 80 BPM    Atrial Rate 80 BPM    P-R Interval 200 ms    QRS Duration 80 ms    Q-T Interval 392 ms    QTC Calculation (Bezet) 452 ms    P Axis 54 degrees    R Axis 32 degrees    T Axis 21 degrees    IHS MUSE NARRATIVE AND IMPRESSION       NORMAL SINUS RHYTHM  WHEN COMPARED WITH ECG OF 04-Feb-2024 12:22,  NO SIGNIFICANT CHANGE    Confirmed by LETICIA, MD, RODGERS ([id number]) on 05/28/2024 9:12:40 AM            Supplemental Encounter Data   Medical History[1]  Past Surgical History[2]  Social History[3]  Family History[4]  Allergies[5]    Encounter Orders:  Orders Placed This Encounter   Procedures    Critical Care    CT Head WO Contrast    CTA  Head & Neck    CT Perfusion Brain    CBC with Differential (Order)    Comprehensive Metabolic Panel    PT/INR    APTT    High Sensitivity Troponin-I    CBC with Differential (Component)    Stool Occult Blood, Single Specimen    Adult diet Therapeutic/ Modified; Solid; Regular (IDDSI level 7); Thin (IDDSI level 0); Heart healthy    Cardiac monitoring  (ED ONLY)    Continuous Pulse Oximetry    Dysphagia Screen    NPO (Including Aspirin ) until dysphagia screen done and passed    Nursing Stroke Protocol and NIHSS    Pupillometer Check    do NOT wait for creatinine/GFR prior to IV contrast administration for acute stroke neuroimaging    Glucose POC    Vital signs    Pulse Oximetry    Progressive Mobility Protocol    I/O    Height    Weight    Skin assessment    Notify physician (Critical Blood Glucose Value)    Notify physician (Communication: Document Abnormal Blood Glucose)    POCT order (PRN hypoglycemia)    Adult Hypoglycemia Treatment Algorithm    Place sequential compression device    Maintain sequential compression device    Education: Activity    Education: Disease Process & Condition    Education: Pain Management    Education: Falls Risk  Telemetry Monitoring - Long Term    Telemetry Box Communication    Full Code    Consult to Neurology    ECG 12 lead    Electrocardiogram, 12-lead - PRN    Insert peripheral IV    Saline lock IV    Saline lock IV    Admit to Observation     Medications Administered:  Medications   lactated ringers  bolus 1,000 mL (has no administration in time range)   meclizine  (ANTIVERT ) tablet 12.5 mg (has no administration in time range)   pantoprazole  (PROTONIX ) EC tablet 40 mg (40 mg Oral Given 05/28/24 1247)   rosuvastatin  (CRESTOR ) tablet 40 mg (40 mg Oral Given 05/28/24 1247)   dextrose  (GLUCOSE) 40 % oral gel 15 g of glucose (has no administration in time range)     Or   dextrose  (D10W) 10% bolus 125 mL (has no administration in time range)     Or   dextrose  50 % bolus 12.5 g (has no administration in time range)     Or   glucagon  (rDNA) (GLUCAGEN) injection 1 mg (has no administration in time range)   acetaminophen  (TYLENOL ) tablet 500 mg (has no administration in time range)   melatonin tablet 3 mg (has no administration in time range)   saline (OCEAN NASAL SPRAY) 0.65 % nasal solution 2 spray (has no administration in time range)    carboxymethylcellulose (PF) (REFRESH PLUS) 0.5 % ophthalmic solution 1 drop (has no administration in time range)   benzocaine -menthol  (CEPACOL/CHLORASEPTIC) lozenge 1 lozenge (has no administration in time range)   benzonatate  (TESSALON ) capsule 100 mg (has no administration in time range)   magnesium  sulfate 1g in dextrose  5% IVPB (premix) (has no administration in time range)   potassium chloride  (KLOR-CON  M20) CR tablet 0-60 mEq (has no administration in time range)     Or   potassium chloride  (KLOR-CON ) packet 0-60 mEq (has no administration in time range)     Or   potassium chloride  10 mEq in 100 mL IVPB (premix) (has no administration in time range)   potassium & sodium phosphates  (PHOS-NAK) 280-160-250 MG packet 2 packet (has no administration in time range)   naloxone  (NARCAN ) injection 0.2 mg (has no administration in time range)   heparin  (porcine) injection 5,000 Units (5,000 Units Subcutaneous Given 05/28/24 1436)   iohexol  (OMNIPAQUE ) 350 MG/ML injection 125 mL (125 mLs Intravenous Imaging Agent Given 05/28/24 0831)     Laboratory and Imaging Studies:  Results for orders placed or performed during the hospital encounter of 05/28/24 (from the past 24 hours)    Collection Time: 05/28/24  7:54 AM   Result Value    Whole Blood Glucose POCT 80   Comprehensive Metabolic Panel    Collection Time: 05/28/24  8:48 AM   Result Value    Glucose 93    BUN 28 (H)    Creatinine 1.5 (H)    Sodium 139    Potassium 4.3    Chloride 105    CO2 27    Calcium  8.3    Anion Gap 7.0    GFR 34.8 (L)    AST (SGOT) 31    ALT 22    Alkaline Phosphatase 61    Albumin  3.4 (L)    Protein, Total 5.8 (L)    Globulin 2.4    Albumin /Globulin Ratio 1.4    Bilirubin, Total 0.2   PT/INR    Collection Time: 05/28/24  8:48 AM   Result Value  PT 11.4    INR 1.0   APTT    Collection Time: 05/28/24  8:48 AM   Result Value    PTT 34   High Sensitivity Troponin-I    Collection Time: 05/28/24  8:48 AM   Result Value    hs Troponin <2.7    CBC with Differential (Component)    Collection Time: 05/28/24  8:48 AM   Result Value    WBC 5.50    Hemoglobin 8.7 (L)    Hematocrit 28.6 (L)    Platelet Count 252    MPV 11.0    RBC 3.22 (L)    MCV 88.8    MCH 27.0    MCHC 30.4 (L)    RDW 14    nRBC % 0.0    Absolute nRBC 0.00    Preliminary Absolute Neutrophil Count 3.23    Neutrophils % 58.6    Lymphocytes % 28.9    Monocytes % 10.4    Eosinophils % 1.5    Basophils % 0.4    Immature Granulocytes % 0.2    Absolute Neutrophils 3.23    Absolute Lymphocytes 1.59    Absolute Monocytes 0.57    Absolute Eosinophils 0.08    Absolute Basophils 0.02    Absolute Immature Granulocytes 0.01     CTA  Head & Neck   Final Result      1.Normal CT perfusion examination of the head. There is no territorial   perfusion abnormality.   2.There is no stenosis of the proximal right internal carotid artery based   on NASCET criteria.   3.There is no stenosis of the proximal left internal carotid artery based   on NASCET criteria.   4.The vertebrobasilar arterial system is patent.   5.Normal intracranial CT angiogram. There is no proximal intracranial large   vessel occlusion.      Milan Arne Jumbo, MD   05/28/2024 9:04 AM      CT Perfusion Brain   Final Result      1.Normal CT perfusion examination of the head. There is no territorial   perfusion abnormality.   2.There is no stenosis of the proximal right internal carotid artery based   on NASCET criteria.   3.There is no stenosis of the proximal left internal carotid artery based   on NASCET criteria.   4.The vertebrobasilar arterial system is patent.   5.Normal intracranial CT angiogram. There is no proximal intracranial large   vessel occlusion.      Milan Arne Jumbo, MD   05/28/2024 9:04 AM      CT Head WO Contrast   Final Result       1.Mild chronic small vessel ischemic disease.      Doylene Patee, MD   05/28/2024 8:09 AM                    Garon Eden, MD  05/28/24 1431         [1]   Past Medical  History:  Diagnosis Date    Breast cancer (CMS/HCC) 2014    right breast mastectomy    Exercise-induced angina     negative work up    GERD (gastroesophageal reflux disease)     Headache     resolved    Hyperlipidemia     Hypertension     Hypothyroidism     Low back pain     Malignant neoplasm of overlapping sites of right female breast (CMS/HCC) 10/19/2015  Syncope and collapse     Chronic Vertigo    Vertigo    [2]   Past Surgical History:  Procedure Laterality Date    COLONOSCOPY, DIAGNOSTIC (SCREENING)  2021    COLONOSCOPY, REMOVAL OF TUMOR, POLYP, OR OTHER LESION BY SNARE TECHNIQUE N/A 07/21/2020    Procedure: COLONOSCOPY, POLYPECTOMY;  Surgeon: Jerri Nicolas, MD;  Location: [facility];  Service: Gastroenterology;  Laterality: N/A;    EGD, BIOPSY N/A 07/21/2020    Procedure: EGD, BIOPSY;  Surgeon: Jerri Nicolas, MD;  Location: [facility];  Service: Gastroenterology;  Laterality: N/A;    EXPLORATORY LAPAROTOMY N/A 05/22/2021    Procedure: EXPLORATORY LAPAROTOMY;  Surgeon: Odean Rollo HERO, MD;  Location: [facility];  Service: General;  Laterality: N/A;    HYSTERECTOMY      LAPAROSCOPY, DIAGNOSTIC N/A 05/22/2021    Procedure: LAPAROSCOPY, DIAGNOSTIC;  Surgeon: Odean Rollo HERO, MD;  Location: [facility];  Service: General;  Laterality: N/A;    LAPAROTOMY, COLECTOMY, RIGHT Right 05/22/2021    Procedure: LAPAROTOMY, COLECTOMY, RIGHT;  Surgeon: Odean Rollo HERO, MD;  Location: [facility];  Service: General;  Laterality: Right;    MASTECTOMY Right 2015   [3]   Social History  Tobacco Use    Smoking status: Never    Smokeless tobacco: Never   Vaping Use    Vaping status: Never Used   Substance Use Topics    Alcohol use: Yes     Comment: wine    Drug use: No   [4]   Family History  Problem Relation Name Age of Onset    Breast cancer Neg Hx     [5]   Allergies  Allergen Reactions    Morphine  Itching    Morphine  And Codeine  Itching and Nausea And Vomiting     dizzy        Garon Eden,  MD  05/28/24 1450

## 2024-05-28 NOTE — Nursing Progress Note (Signed)
 4 eyes in 4 hours pressure injury assessment note:      Completed with: clin tech  Unit & Time admitted: 6B 1100             Bony Prominences: Check appropriate box; if Pressure Injury is present enter Pressure Injury assessment in LDA    Occiput:                    []  Pressure Injury present  Face:                        []  Pressure Injury present  Ears:                         []  Pressure Injury present  Spine:                       []  Pressure Injury present  Shoulders:                []  Pressure Injury present  Elbows:                     []  Pressure Injury present  Sacrum/coccyx:        []  Pressure Injury present  Ischial Tuberosity:    []  Pressure Injury present  Trochanter/Hip:        []  Pressure Injury present  Knees:                      []  Pressure Injury present  Ankles:                     []  Pressure Injury present  Heels:                       []  Pressure Injury present  Other pressure areas: []  Pressure Injury location       Device related: []  Device name:         LDA completed if pressure injury present: yes/no  Consult WOCN if necessary    Other skin related issues, ie tears, rash, etc, document in Integumentary flowsheet

## 2024-05-28 NOTE — EDIE (Signed)
 [facility] NOTIFICATION 05/28/2024 01:49 Emily [name], Emily [name] DOB: 06/19/1946 MRN: [id number]    [place] [facility]'s patient encounter information:   FMW:?[id number]  Account [id number]  Billing Account [id number]      Criteria Met      5 ED Visits in 12 Months    Security and Safety  No Security Events were found.  ED Care Guidelines  There are currently no ED Care Guidelines for this patient. Please check your facility's medical records system.        Prescription Monitoring Program  000 ??- Narcotic Use Score   000 ??- Sedative Use Score   000 ??- Stimulant Use Score   000??- Overdose Risk Score  - All Scores range from 000-999 with 75% of the population scoring < 200 and only 1% scoring above 650  - The last digit of the narcotic, sedative, and stimulant score indicates the number of active prescriptions of that type  - Higher Use scores correlate with increased prescribers, pharmacies, mg equiv, and overlapping prescriptions   - Higher Overdose Risk Scores correlate with increased risk of unintentional overdose death   Concerning or unexpectedly high scores should prompt a review of the PMP record; this does not constitute checking PMP for prescribing purposes.    E.D. Visit Count (12 mo.)  Facility Visits   [place] [facility] 6   Total 6   Note: Visits indicate total known visits.     Recent Emergency Department Visit Summary  Date Facility [facility] Type Diagnoses or Chief Complaint    May 28, 2024  [facility] [place] [facility]  [place]  [place]  Emergency      Dizziness,Weakness      Feb 04, 2024  [place] [facility] [place] [facility]  [place]  [place]  Emergency      Hypertensive emergency      Dizziness      Hypertension      Nausea      Nausea/Vomitting      Dec 23, 2023  [place] [facility] [place] [facility]  [place]  [place]  Emergency      Other specified noninflammatory disorders of vagina      Unspecified fall, initial encounter      Lumbago with sciatica, right side      Other chronic pain      Urinary Tract  Infection Symptoms      Fall      medic      Nov 25, 2023  [place] [facility] [place] [facility]  [place]  [place]  Emergency      Chest pain, unspecified      Dizziness and giddiness      Vascular complications following infusion, transfusion and therapeutic injection, initial encounter      Headache      Dizziness      BODY PAIN      Oct 19, 2023  [place] [facility]  [place]    Emergency      Low back pain, unspecified      Pain in right leg      Pain in right shoulder      Other chest pain      Unspecified fall due to ice and snow, initial encounter      Fall      Aug 06, 2023  [place] [facility]  [place]    Emergency      Acute kidney failure, unspecified  Other specified soft tissue disorders      Acute cystitis without hematuria      Leg Swelling      WAISTE AND LEG PAIN        Recent Inpatient Visit Summary  Date Facility [facility] Type Diagnoses or Chief Complaint    Feb 04, 2024  [place] [facility] [place] [facility]  [place]  [place]  Medical Surgical      Mixed hyperlipidemia      Essential (primary) hypertension      Hypertensive emergency        Care Team  Provider Specialty Phone Fax Service Dates   [place], [facility] , DO Internal Medicine   Current    DAVIDSON, ARLEY HERO MD M, MD Family Medicine: Adult Medicine [phone] Current    Jannett Jay HERO., M.D. Internal Medicine   Current    SHAWNEE MERL FALCON, M.D. Internal Medicine   Current    JAKIE RAISIN, NP Nurse Practitioner: Adult Health [phone]  Current    JACKLYN MERRIANNE GOODNIGHT Nurse Practitioner: Gerontology [phone] Current      [facility]  This patient has registered at the [facility] Emergency Department  For more information visit: [email or web address]     PLEASE NOTE:     1.   Any care recommendations and other clinical information are provided as guidelines or for historical purposes only, and providers should  exercise their own clinical judgment when providing care.    2.   You may only use this information for purposes of treatment, payment or health care operations activities, and subject to the limitations of applicable [facility] Policies.    3.   You should consult directly with the organization that provided a care guideline or other clinical history with any questions about additional information or accuracy or completeness of information provided.    ? 2025 [facility] - [email or web address]

## 2024-05-28 NOTE — Plan of Care (Signed)
 Problem: Day of Admission - Stroke  Goal: Core/Quality measure requirements - Admission  Flowsheets (Taken 05/28/2024 1449)  Core/Quality measure requirements - Admission:   Document NIH Stroke Scale on admission   Document nursing swallow/dysphagia screen on admission. If patient fails, keep patient NPO (follow your hospital protocol on swallowing screening).   VTE Prevention: Ensure anticoagulant(s) administered and/or anti-embolism stockings/devices documented as ordered   Ensure antithrombotic administered or contraindication documented by LIP   Ensure lipid panel ordered   Begin stroke education on admission (must include Modifiable Risk Factors, Warning Signs and Symptoms of Stroke, Activation of Emergency Medical System and Follow-up Appointments) Ensure handout has been given and documented.   If diagnosis or history of Atrial Fib/Atrial Flutter, ensure oral anticoagulation is initiated or contraindication documented by LIP   Ensure PT/OT and/or SLP ordered

## 2024-05-28 NOTE — ED Notes (Signed)
 IV removed post infiltration

## 2024-05-28 NOTE — Nursing Progress Note (Signed)
 Pt off the unit for MRI.

## 2024-05-28 NOTE — H&P (Signed)
 Internal Medicine Hospitalists  History and Physical        Assessment / Plan:        Emily [name] is a 78 y.o. female with history of breast cancer, hypertension, hyperlipidemia coming in with symptoms of rule out CVA.    # Rule out CVA  --Initiate stroke protocol  -- CTA head was normal  -- Head CT with mild chronic small vessel ischemic changes  --Neurology consulted by emergency room  --MRI of the brain  --Check echocardiogram if MRI is positive  --Monitor on telemetry  --Check B12, folate, TSH, hemoglobin A1c  --Aspirin  81 mg daily  -- Permissive hypertension      # History of hypertension  -- Will hold off BP medications for now  -- Permissive hypertension as above    # History of hyperlipidemia  -- Continue Crestor          VTE Prophylaxis: heparin  (porcine) injection 5,000 Units  Place sequential compression device     Foley Catheter: No Foley Present    Venous Access: No Temporary Central Line Present    Medical Readiness for Discharge: Anticipated Tomorrow    Open Handoff Activity in Sidebar    Additional Diagnoses:     Patient has a BMI of 36.69 kg/m2    Class 2 Obesity: BMI of 35 to 39.9     Recent Labs   Lab 05/28/24  0848   Hemoglobin 8.7*   Hematocrit 28.6*   MCV 88.8   WBC 5.50   Platelet Count 252         Anemia Diagnosis: Chronic: Anemia of Chronic Disease       HPI:      78 year old female with history of breast cancer, history of hypertension and hyperlipidemia who had symptoms of dizziness and hence came to the emergency room.  Patient went to bed around 10 PM and when she woke up in the morning she felt weak and dizzy.  Last night also she had a headache and felt weak.  She had an episode of vomiting.  Noted to have right leg numbness.  Patient states her left arm is also weak.  Patient follows up with oncology for hormone therapy for her breast cancer.  Unable to recall the name of the oncologist.  Patient's speech appeared to be slurred not sure if that is her baseline.  No fever or  chills.  Patient denies any chest pain or shortness of breath.       Past Medical / Surgical / Family / Social History:     Past Medical History:   Diagnosis Date    Breast cancer (CMS/HCC) 2014    right breast mastectomy    Exercise-induced angina     negative work up    GERD (gastroesophageal reflux disease)     Headache     resolved    Hyperlipidemia     Hypertension     Hypothyroidism     Low back pain     Malignant neoplasm of overlapping sites of right female breast (CMS/HCC) 10/19/2015    Syncope and collapse     Chronic Vertigo    Vertigo      Past Surgical History[1]   Family History[2]  Social History[3]    Allergies and Home Medications:     Allergies[4]  Home Medications               amLODIPine  (NORVASC ) 10 MG tablet     Take 1 tablet (10 mg)  by mouth once daily     anastrozole  (ARIMIDEX ) 1 MG tablet     Take 1 tablet (1 mg total) by mouth every morning     aspirin  81 MG chewable tablet     Chew 1 tablet (81 mg total) by mouth daily     meclizine  (ANTIVERT ) 12.5 MG tablet     Take 1 tablet (12.5 mg) by mouth 3 (three) times daily as needed for Dizziness     Multiple Vitamins-Minerals (MULTIVITAMIN WITH MINERALS) tablet     Take 1 tablet by mouth once every morning     pantoprazole  (PROTONIX ) 40 MG tablet (Expired)     Take 1 tablet (40 mg) by mouth every morning before breakfast     rosuvastatin  (CRESTOR ) 40 MG tablet (Expired)     Take 1 tablet (40 mg) by mouth once daily            Review of Systems:     As per HPI    Objective:      Patient Vitals for the past 24 hrs:   BP Temp Temp src Pulse Resp SpO2 Weight   05/28/24 1055 128/58 97.5 F (36.4 C) Oral 60 -- 98 % --   05/28/24 1054 128/58 97.5 F (36.4 C) Oral 71 17 98 % --   05/28/24 0930 150/66 -- -- 72 17 100 % --   05/28/24 0841 150/66 -- -- 73 -- 100 % --   05/28/24 0800 -- -- -- -- -- -- 66.5 kg (146 lb 9.7 oz)   05/28/24 0753 120/64 97.9 F (36.6 C) Oral 82 17 100 % --      General: awake, alert, oriented x 3; no acute distress  HEENT:  anicteric sclerae, PERRL, EOMI; MMM  Cardiovascular: regular rate and rhythm; no murmurs, rubs, or gallops  Lungs: clear to auscultation bilaterally without wheezing, rhonchi, or rales  Abdomen: soft, non-distended; non-tender to palpation, no rebound or guarding  Extremities: warm; no LE edema; no clubbing or cyanosis  Neuro: symmetric facial movements, slightly muffled speech-unclear whether this is new, moving all extremities          CBC:  Recent Labs   Lab 05/28/24  0848   WBC 5.50   Hemoglobin 8.7*   Hematocrit 28.6*   Platelet Count 252        BMP:   Recent Labs   Lab 05/28/24  0848   Sodium 139   Potassium 4.3   Chloride 105   CO2 27   BUN 28*   Creatinine 1.5*   Calcium  8.3   Glucose 93     LFT:   Recent Labs   Lab 05/28/24  0848   Albumin  3.4*   Protein, Total 5.8*   Bilirubin, Total 0.2   Alkaline Phosphatase 61   ALT 22   AST (SGOT) 31     Coags:   Recent Labs   Lab 05/28/24  0848   PT 11.4   INR 1.0   PTT 34     DIC:       Images personally reviewed:  @image @    EKG personally reviewed:    Current Facility-Administered Medications[5]  Infusion Meds[6]  PRN Medications[7]       Signed by: Michiel Lulas, MD         [1]   Past Surgical History:  Procedure Laterality Date    COLONOSCOPY, DIAGNOSTIC (SCREENING)  2021    COLONOSCOPY, REMOVAL OF TUMOR, POLYP, OR OTHER LESION BY SNARE TECHNIQUE  N/A 07/21/2020    Procedure: COLONOSCOPY, POLYPECTOMY;  Surgeon: Jerri Nicolas, MD;  Location: [facility];  Service: Gastroenterology;  Laterality: N/A;    EGD, BIOPSY N/A 07/21/2020    Procedure: EGD, BIOPSY;  Surgeon: Jerri Nicolas, MD;  Location: [facility];  Service: Gastroenterology;  Laterality: N/A;    EXPLORATORY LAPAROTOMY N/A 05/22/2021    Procedure: EXPLORATORY LAPAROTOMY;  Surgeon: Odean Rollo HERO, MD;  Location: [facility];  Service: General;  Laterality: N/A;    HYSTERECTOMY      LAPAROSCOPY, DIAGNOSTIC N/A 05/22/2021    Procedure: LAPAROSCOPY, DIAGNOSTIC;  Surgeon: Odean Rollo HERO, MD;   Location: [facility];  Service: General;  Laterality: N/A;    LAPAROTOMY, COLECTOMY, RIGHT Right 05/22/2021    Procedure: LAPAROTOMY, COLECTOMY, RIGHT;  Surgeon: Odean Rollo HERO, MD;  Location: [facility];  Service: General;  Laterality: Right;    MASTECTOMY Right 2015   [2]   Family History  Problem Relation Name Age of Onset    Breast cancer Neg Hx     [3]   Social History  Tobacco Use    Smoking status: Never    Smokeless tobacco: Never   Vaping Use    Vaping status: Never Used   Substance Use Topics    Alcohol use: Yes     Comment: wine    Drug use: No   [4]   Allergies  Allergen Reactions    Morphine  Itching    Morphine  And Codeine  Itching and Nausea And Vomiting     dizzy   [5]   Current Facility-Administered Medications   Medication Dose Route Frequency    heparin  (porcine)  5,000 Units Subcutaneous Q8H [facility]    lactated ringers   1,000 mL Intravenous Once    pantoprazole   40 mg Oral QAM AC    rosuvastatin   40 mg Oral Daily   [6]   Current Facility-Administered Medications   Medication Dose Route Frequency Last Rate   [7]   Current Facility-Administered Medications   Medication Dose Route    acetaminophen   500 mg Oral    benzocaine -menthol   1 lozenge Buccal    benzonatate   100 mg Oral    carboxymethylcellulose sodium  1 drop Both Eyes    dextrose   15 g of glucose Oral    Or    dextrose   12.5 g Intravenous    Or    dextrose   12.5 g Intravenous    Or    glucagon  (rDNA)  1 mg Intramuscular    magnesium  sulfate  1 g Intravenous    meclizine   12.5 mg Oral    melatonin  3 mg Oral    naloxone   0.2 mg Intravenous    potassium & sodium phosphates   2 packet Oral    potassium chloride   0-60 mEq Oral    Or    potassium chloride   0-60 mEq Oral    Or    potassium chloride   10 mEq Intravenous    saline  2 spray Each Nare

## 2024-05-28 NOTE — ED to IP RN Note (Signed)
 [facility]  ED NURSING NOTE FOR THE RECEIVING INPATIENT NURSE   ED NURSE [place] [phone]   ED CHARGE RN Nur   ADMISSION INFORMATION   Emily [name] is a 78 y.o. female admitted with an ED diagnosis of:    1. Leg numbness         Isolation: None   Allergies: Morphine  and Morphine  and codeine    Holding Orders confirmed? No   Belongings Documented? Yes   Home medications sent to pharmacy confirmed? No   NURSING CARE   Patient Comes From:   Mental Status: Home Independent  alert and oriented   ADL: Independent with all ADLs   Ambulation: no difficulty   Pertinent Information  and Safety Concerns:     Broset Violence Risk Level: Low Arrived in ER for left sided numbness, admit for left leg numbness     CT / NIH   CT Head ordered on this patient?  Yes   NIH/Dysphagia assessment done prior to admission? Yes   VITAL SIGNS (at the time of this note)      Vitals:    05/28/24 0930   BP: 150/66   Pulse: 72   Resp: 17   Temp:    SpO2: 100%     Pain Score: 0-No pain (05/28/24 0800)

## 2024-05-29 ENCOUNTER — Observation Stay (HOSPITAL_BASED_OUTPATIENT_CLINIC_OR_DEPARTMENT_OTHER)

## 2024-05-29 DIAGNOSIS — R202 Paresthesia of skin: Secondary | ICD-10-CM

## 2024-05-29 DIAGNOSIS — R200 Anesthesia of skin: Secondary | ICD-10-CM

## 2024-05-29 DIAGNOSIS — I10 Essential (primary) hypertension: Secondary | ICD-10-CM

## 2024-05-29 LAB — RENAL FUNCTION PANEL
Albumin: 3.6 g/dL (ref 3.5–5.0)
Anion Gap: 9 (ref 5.0–15.0)
BUN: 18 mg/dL (ref 7–21)
CO2: 26 meq/L (ref 17–29)
Calcium: 9 mg/dL (ref 7.9–10.2)
Chloride: 108 meq/L (ref 99–111)
Creatinine: 1.3 mg/dL — ABNORMAL HIGH (ref 0.4–1.0)
GFR: 41.3 mL/min/1.73 m2 — ABNORMAL LOW (ref >=60.0)
Glucose: 86 mg/dL (ref 70–100)
Phosphorus: 4.1 mg/dL (ref 2.3–4.7)
Potassium: 5 meq/L (ref 3.5–5.3)
Sodium: 143 meq/L (ref 135–145)

## 2024-05-29 LAB — HEMOGLOBIN A1C
Average Estimated Glucose: 122.6 mg/dL
Hemoglobin A1C: 5.9 % — ABNORMAL HIGH (ref 4.6–5.6)

## 2024-05-29 LAB — LIPID PANEL
Cholesterol / HDL Ratio: 3.2 {index}
Cholesterol: 149 mg/dL (ref <=199)
HDL: 47 mg/dL (ref >=40)
LDL Calculated: 93 mg/dL (ref 0–99)
Triglycerides: 47 mg/dL (ref 34–149)
VLDL Calculated: 9 mg/dL — ABNORMAL LOW (ref 10–40)

## 2024-05-29 LAB — ECHO ADULT TTE COMPLETE
AV Area (Cont Eq VTI): 1.8676
AV Mean Gradient: 5
AV Peak Velocity: 1.48
BP Mod LV Ejection Fraction: 67
IVS Diastolic Thickness (2D): 0.839
LA Dimension (2D): 2.8
LA Volume Index (BP A-L): 26.5851
LVID diastole (2D): 3.67
LVID systole (2D): 2.29
MV E/A: 0.624
MV E/e' (Average): 9.8046
Prox Ascending Aorta Diameter: 3.2
Pulmonary Valve Findings: NORMAL
RV Basal Diastolic Dimension: 3.02
RV Systolic Pressure: 24.3444
TAPSE: 2.7
Tricuspid Valve Findings: NORMAL

## 2024-05-29 LAB — CBC
Absolute nRBC: 0 x10 3/uL (ref <=0.00)
Hematocrit: 34.4 % — ABNORMAL LOW (ref 34.7–43.7)
Hemoglobin: 10.3 g/dL — ABNORMAL LOW (ref 11.4–14.8)
MCH: 26.7 pg (ref 25.1–33.5)
MCHC: 29.9 g/dL — ABNORMAL LOW (ref 31.5–35.8)
MCV: 89.1 fL (ref 78.0–96.0)
MPV: 12.7 fL — ABNORMAL HIGH (ref 8.9–12.5)
Platelet Count: 224 x10 3/uL (ref 142–346)
RBC: 3.86 x10 6/uL — ABNORMAL LOW (ref 3.90–5.10)
RDW: 14 % (ref 11–15)
WBC: 6 x10 3/uL (ref 3.10–9.50)
nRBC %: 0 /100{WBCs} (ref <=0.0)

## 2024-05-29 LAB — PT/INR
INR: 1 (ref 0.9–1.1)
PT: 11.6 s (ref 10.1–12.9)

## 2024-05-29 LAB — THYROID STIMULATING HORMONE (TSH) WITH REFLEX TO FREE T4: TSH: 0.93 u[IU]/mL (ref 0.35–4.94)

## 2024-05-29 NOTE — Discharge Summary (Signed)
 Internal Medicine Hospitalists  Discharge Summary          Date of Admission: 05/28/2024  Date of Discharge: No discharge date for patient encounter.    Discharge Diagnoses:   TIA  Dizziness    Additional Diagnoses:        History of hypertension  History of hyperlipidemia    Hospital Course:      Reason for Admission:  From H&P: 78 y.o. female with history of breast cancer, hypertension, hyperlipidemia coming in with symptoms of rule out CVA.      Hospital Course:  Patient was admitted with:  # Rule out CVA  --Initiate stroke protocol  -- CTA head was normal  -- Head CT with mild chronic small vessel ischemic changes  -- Dr. Edd, the neurologist saw the patient and is cleared for discharge  --MRI of the brain-negative for stroke  -- Echocardiogram was within normal range  --Monitored on telemetry  --Check B12, folate, TSH, hemoglobin A1c  --Aspirin  81 mg daily  -- Permissive hypertension was done   --PT recommendations for home, patient able to ambulate, now at baseline    # History of hypertension  -- Will hold off BP medications for now  -- Permissive hypertension as above     # History of hyperlipidemia  -- Continue Crestor     Stable for discharge.     Discharge Day Exam:   Temp:  [97.5 F (36.4 C)-97.7 F (36.5 C)] 97.5 F (36.4 C)  Heart Rate:  [55-85] 69  Resp Rate:  [14-20] 15  BP: (99-136)/(52-79) 125/62  General: awake, alert, oriented x 3; no acute distress  HEENT: anicteric sclerae, PERRL, EOMI; MMM  Cardiovascular: regular rate and rhythm; no murmurs, rubs, or gallops  Lungs: clear to auscultation bilaterally without wheezing, rhonchi, or rales  Abdomen: soft, non-distended; non-tender to palpation, no rebound or guarding  Extremities: warm; no LE edema; no clubbing or cyanosis  Neuro: symmetric facial movements, clear speech, moving all extremities    Pertinent Labs:   CBC:   Recent Labs   Lab 05/29/24  0520 05/28/24  1610 05/28/24  0848   WBC 6.00 7.13 5.50   Hemoglobin 10.3* 10.7* 8.7*    Hematocrit 34.4* 34.5* 28.6*   Platelet Count 224 297 252   MCV 89.1 88.0 88.8     BMP:   Recent Labs   Lab 05/29/24  0520 05/28/24  1610 05/28/24  0848   Sodium 143 146* 139   Potassium 5.0 4.2 4.3   Chloride 108 107 105   CO2 26 26 27    BUN 18 21 28*   Creatinine 1.3* 1.2* 1.5*   Calcium  9.0 9.6 8.3   Glucose 86 87 93     LFT:   Recent Labs   Lab 05/29/24  0520 05/28/24  1610 05/28/24  0848   Albumin  3.6 4.2 3.4*   Protein, Total  --  7.4 5.8*   Bilirubin, Total  --  0.3 0.2   Alkaline Phosphatase  --  79 61   ALT  --  28 22   AST (SGOT)  --  36 31     Coags:   Recent Labs   Lab 05/29/24  0520 05/28/24  0848   PT 11.6 11.4   INR 1.0 1.0   PTT  --  34           Radiology and Procedures:   Radiology: all results from this admission  MRI brain without contrast  Result  Date: 05/28/2024   1.No acute infarct, intracranial hemorrhage, or mass effect. 2.Chronic small vessel ischemic changes. Reyes Argyle, MD 05/28/2024 10:55 PM    CTA  Head & Neck  Result Date: 05/28/2024  1.Normal CT perfusion examination of the head. There is no territorial perfusion abnormality. 2.There is no stenosis of the proximal right internal carotid artery based on NASCET criteria. 3.There is no stenosis of the proximal left internal carotid artery based on NASCET criteria. 4.The vertebrobasilar arterial system is patent. 5.Normal intracranial CT angiogram. There is no proximal intracranial large vessel occlusion. Milan Arne Jumbo, MD 05/28/2024 9:04 AM    CT Perfusion Brain  Result Date: 05/28/2024  1.Normal CT perfusion examination of the head. There is no territorial perfusion abnormality. 2.There is no stenosis of the proximal right internal carotid artery based on NASCET criteria. 3.There is no stenosis of the proximal left internal carotid artery based on NASCET criteria. 4.The vertebrobasilar arterial system is patent. 5.Normal intracranial CT angiogram. There is no proximal intracranial large vessel occlusion. Milan Arne Jumbo, MD 05/28/2024 9:04 AM    CT Head WO Contrast  Result Date: 05/28/2024   1.Mild chronic small vessel ischemic disease. Doylene Patee, MD 05/28/2024 8:09 AM     Discharge Medications and Documented Allergies:        Discharge Medication List        Taking      amLODIPine  10 MG tablet  Dose: 10 mg  Commonly known as: NORVASC   For: High Blood Pressure  Take 1 tablet (10 mg) by mouth once daily     anastrozole  1 MG tablet  Dose: 1 mg  Commonly known as: ARIMIDEX   For: Early Cancer of the Breast in Postmenopausal Women  Take 1 tablet (1 mg total) by mouth every morning     aspirin  81 MG chewable tablet  Dose: 81 mg  Chew 1 tablet (81 mg total) by mouth daily     meclizine  12.5 MG tablet  Dose: 12.5 mg  Commonly known as: ANTIVERT   For: Dizzy  Take 1 tablet (12.5 mg) by mouth 3 (three) times daily as needed for Dizziness     multivitamin with minerals tablet  Dose: 1 tablet  Take 1 tablet by mouth once every morning     pantoprazole  40 MG tablet  Dose: 40 mg  Commonly known as: PROTONIX   For: Gastroesophageal Reflux Disease  Take 1 tablet (40 mg) by mouth every morning before breakfast     rosuvastatin  40 MG tablet  Dose: 40 mg  Commonly known as: CRESTOR   For: High Amount of Fats in the Blood  Take 1 tablet (40 mg) by mouth once daily              Allergies[1]         Disposition:     Discharge Disposition: Home with family    Discharge Code Status: Full Code    Patient Emergency Contact:  Extended Emergency Contact Information  Primary Emergency Contact: AMOAH,Theophilus  Mobile Phone: [phone]  Relation: Son  Preferred language: English  Interpreter needed? No    Discharge Instructions:     Patient Instructions: (See AVS for full details)  Follow-up with PMD in 1 week    Most Recent Diet Order:  Orders Placed This Encounter   Procedures    Adult diet Therapeutic/ Modified; Solid; Regular (IDDSI level 7); Thin (IDDSI level 0); Heart healthy       Activity/Weight Bearing Status: Activity as  tolerated  Outpatient Follow-Up Plan:     Instructions for PCP:  Follow-up with PMD in 1 week.  Patient's MRI was negative for stroke.    Appointments:   Follow-up Information       Elesho, Reyno  M, MD Follow up in 1 week(s).    Specialty: Internal Medicine  Contact information:  [address]  [place] [address]  [phone]                             Pending Labs, Microbiology, and Pathology:  Unresulted Labs       None            Attestations and Signatures:     Minutes spent coordinating discharge and reviewing discharge plan: 45 minutes      CBC:  Recent Labs   Lab 05/29/24  0520 05/28/24  1610 05/28/24  0848   WBC 6.00 7.13 5.50   Hemoglobin 10.3* 10.7* 8.7*   Hematocrit 34.4* 34.5* 28.6*   Platelet Count 224 297 252        BMP:   Recent Labs   Lab 05/29/24  0520 05/28/24  1610 05/28/24  0848   Sodium 143 146* 139   Potassium 5.0 4.2 4.3   Chloride 108 107 105   CO2 26 26 27    BUN 18 21 28*   Creatinine 1.3* 1.2* 1.5*   Calcium  9.0 9.6 8.3   Glucose 86 87 93     LFT:   Recent Labs   Lab 05/29/24  0520 05/28/24  1610 05/28/24  0848   Albumin  3.6 4.2 3.4*   Protein, Total  --  7.4 5.8*   Bilirubin, Total  --  0.3 0.2   Alkaline Phosphatase  --  79 61   ALT  --  28 22   AST (SGOT)  --  36 31     Coags:   Recent Labs   Lab 05/29/24  0520 05/28/24  0848   PT 11.6 11.4   INR 1.0 1.0   PTT  --  34       Images personally reviewed:  @image @    EKG personally reviewed:    Current Facility-Administered Medications[2]  Infusion Meds[3]  PRN Medications[4]       Signed by: Michiel Lulas, MD         [1]   Allergies  Allergen Reactions    Morphine  Itching    Morphine  And Codeine  Itching and Nausea And Vomiting     dizzy   [2]   Current Facility-Administered Medications   Medication Dose Route Frequency    heparin  (porcine)  5,000 Units Subcutaneous Q8H [facility]    lactated ringers   1,000 mL Intravenous Once    pantoprazole   40 mg Oral QAM AC    rosuvastatin   40 mg Oral Daily   [3]   Current  Facility-Administered Medications   Medication Dose Route Frequency Last Rate   [4]   Current Facility-Administered Medications   Medication Dose Route    acetaminophen   500 mg Oral    benzocaine -menthol   1 lozenge Buccal    benzonatate   100 mg Oral    carboxymethylcellulose sodium  1 drop Both Eyes    dextrose   15 g of glucose Oral    Or    dextrose   12.5 g Intravenous    Or    dextrose   12.5 g Intravenous    Or    glucagon  (  rDNA)  1 mg Intramuscular    hydrALAZINE   10 mg Intravenous    labetalol   10 mg Intravenous    magnesium  sulfate  1 g Intravenous    meclizine   12.5 mg Oral    melatonin  3 mg Oral    naloxone   0.2 mg Intravenous    potassium & sodium phosphates   2 packet Oral    potassium chloride   0-60 mEq Oral    Or    potassium chloride   0-60 mEq Oral    Or    potassium chloride   10 mEq Intravenous    saline  2 spray Each Nare

## 2024-05-29 NOTE — Progress Notes (Signed)
 05/29/24 9161   CMA Tasks   CMA tasks MOON delivered

## 2024-05-29 NOTE — Consults (Signed)
 NEUROLOGY CONSULTATION    Date Time: 05/29/24 5:59 PM  Patient Name: Emily [name]  Attending Physician: No att. providers found      Assessment & Plan:   New onset of dizziness spinning sensation and right leg numbness currently doing better symptoms have all resolved brain MRI negative  Prior multiple events with similar different presentations with one-sided weakness and numbness with multiple times patient has had negative scans?  Psychogenic anxiety effect  Given brain scan negative okay for discharge from neurostandpoint  Please call if any other questions        History of Present Illness:   Neurology consultation requested by:-->  Patient is a very pleasant 78 year old lady, comes into the hospital because of new onset of dizziness generalized weakness and right leg numbness all her symptoms are currently resolved    She has a prior history of high blood pressure hyperlipidemia breast cancer she has had similar events in the past, but with negative imaging      Recent Labs   Lab 05/29/24  0520   Hemoglobin A1C 5.9*     Recent Labs   Lab 05/29/24  0520   Cholesterol 149   Triglycerides 47   HDL 47   LDL Calculated 93     Recent Labs   Lab 05/29/24  0520   LDL Calculated 93        Past Medical History:   Medical History[1]    Meds:   Crestor  Protonix       Allergies[2]    Social & Family History:   Social History[3]  Family History[4]        CODE STATUS: Full code  I personally reviewed all of the medications.  Medication list generated using all available resources.  Elder abuse (physical)  - negative  Advanced care plan - reviewed from chart or in discussion with pt or family    Review of Systems:   No headache, eye, ear nose, throat problems; no coughing or wheezing or shortness of breath, No chest pain or orthopnea, no abdominal pain, nausea or vomiting, No pain in the body or extremities, no psychiatric, neurological, endocrine, hematological or cardiac complaints except as noted above.        Physical Exam:   Blood pressure 125/62, pulse 69, temperature 97.5 F (36.4 C), temperature source Oral, resp. rate 15, height 1.63 m (5' 4.17), weight 65.6 kg (144 lb 11.2 oz), SpO2 100%.    HEENT: Normocephalic.no carotid bruits  Lungs:  CTA bil  Abd Soft   Cardiac:  S1,S2, normal rate and rhythm  Neck: supple, no cartoid bruits  Extremities: no edema  Skin: no rashes seen in exposed areas     Neuro:  Level of consciousness:  Alert and appropriate  Oriented:  X 3  Cognition:  Intact naming, recognition, concentration and following complex commands  Cranial Nerves:  II-XII intact  Strength:  No upper extremity drift, 5/5 strength x 4 extremities  Coordination:  Intact FTN testing  Reflexes:  +2 throughout, down going toes bil  Sensation: Intact x 4 extremities to LT, temp,   Labs:     Recent Labs   Lab 05/29/24  0520 05/28/24  1610 05/28/24  0848   Glucose 86 87 93   BUN 18 21 28*   Creatinine 1.3* 1.2* 1.5*   Calcium  9.0 9.6 8.3   Sodium 143 146* 139   Potassium 5.0 4.2 4.3   Chloride 108 107 105   CO2 26 26 27  Albumin  3.6 4.2 3.4*   Phosphorus 4.1  --   --    AST (SGOT)  --  36 31   ALT  --  28 22   Bilirubin, Total  --  0.3 0.2   Alkaline Phosphatase  --  79 61     Recent Labs   Lab 05/29/24  0520 05/28/24  1610 05/28/24  0848   WBC 6.00 7.13 5.50   Hemoglobin 10.3* 10.7* 8.7*   Hematocrit 34.4* 34.5* 28.6*   MCV 89.1 88.0 88.8   MCH 26.7 27.3 27.0   MCHC 29.9* 31.0* 30.4*   Platelet Count 224 297 252         Recent Labs     05/29/24  0520 05/28/24  0848   PTT  --  34   PT 11.6 11.4   INR 1.0 1.0        MRI brain without contrast  Result Date: 05/28/2024   1.No acute infarct, intracranial hemorrhage, or mass effect. 2.Chronic small vessel ischemic changes. Reyes Argyle, MD 05/28/2024 10:55 PM       All recent brain and spine imaging (MRI, CT) personally reviewed.    Chart reviewed    Case discussed with: pt    TTS ; > Time spent examining patient, in counseling or coordination of care, reviewing  test results, and in documentation. 50% time spent in counseling or coordination of care        This note was generated by the Epic EMR system/Speech recognition and may contain inherent errors or omissions not intended by the user. Grammatical errors, random word insertions, deletions and pronoun errors  are occasional consequences of this technology due to software limitations.   Not all errors are caught or corrected. If there are questions or concerns about the content of this note or information contained within the body of this dictation they should be addressed directly with the author for clarification.    Signed by: Reese GORMAN Erps, MD, MD  Spectralink: [phone]      Answering Service: [phone]           [1]   Past Medical History:  Diagnosis Date    Breast cancer (CMS/HCC) 2014    right breast mastectomy    Exercise-induced angina     negative work up    GERD (gastroesophageal reflux disease)     Headache     resolved    Hyperlipidemia     Hypertension     Hypothyroidism     Low back pain     Malignant neoplasm of overlapping sites of right female breast (CMS/HCC) 10/19/2015    Syncope and collapse     Chronic Vertigo    Vertigo    [2]   Allergies  Allergen Reactions    Morphine  Itching    Morphine  And Codeine  Itching and Nausea And Vomiting     dizzy   [3]   Social History  Socioeconomic History    Marital status: Widowed   Tobacco Use    Smoking status: Never    Smokeless tobacco: Never   Vaping Use    Vaping status: Never Used   Substance and Sexual Activity    Alcohol use: Yes     Comment: wine    Drug use: No    Sexual activity: Not Currently     Social Drivers of Health     Financial Resource Strain: Low Risk  (02/05/2024)    Overall Financial Resource Strain (CARDIA)  Difficulty of Paying Living Expenses: Not hard at all   Food Insecurity: No Food Insecurity (05/28/2024)    Hunger Vital Sign     Worried About Running Out of Food in the Last Year: Never true     Ran Out of Food in the Last Year:  Never true   Transportation Needs: No Transportation Needs (05/28/2024)    PRAPARE - Therapist, art (Medical): No     Lack of Transportation (Non-Medical): No   Physical Activity: Inactive (02/06/2024)    Exercise Vital Sign     Days of Exercise per Week: 0 days     Minutes of Exercise per Session: 0 min   Stress: No Stress Concern Present (02/06/2024)    [employer] of Occupational Health - Occupational Stress Questionnaire     Feeling of Stress : Only a little   Social Connections: Moderately Isolated (02/06/2024)    Social Connection and Isolation Panel     Frequency of Communication with Friends and Family: More than three times a week     Frequency of Social Gatherings with Friends and Family: Three times a week     Attends Religious Services: More than 4 times per year     Active Member of Clubs or Organizations: No     Attends Banker Meetings: Never     Marital Status: Widowed   Intimate Partner Violence: Not At Risk (05/28/2024)    Humiliation, Afraid, Rape, and Kick questionnaire     Fear of Current or Ex-Partner: No     Emotionally Abused: No     Physically Abused: No     Sexually Abused: No   Housing Stability: Not At Risk (05/28/2024)    Housing Stability NCSS     Do you have housing?: Yes     Are you worried about losing your housing?: No   [4]   Family History  Problem Relation Name Age of Onset    Breast cancer Neg Hx

## 2024-05-29 NOTE — Discharge Instr - AVS First Page (Signed)
 Reason for your Hospital Admission:  Dizziness, Leg numbness      Instructions for after your discharge:    Please follow-up with your primary care physician in 1 week.  If you have recurrence of symptoms please return to the emergency room.

## 2024-05-29 NOTE — Progress Notes (Cosign Needed)
 [facility] Clinical Nutrition  Nutrition Assessment    Patient: Emily [name] 78 y.o. female  MRN: [id number]  Location/Room: MI622/MI622-01  Isolation Status: No active isolations    Reason for Assessment: MST 2 - 2-13# unintentional weight loss and decreased appetite.    Nutrition Plan and Recommendations:     Interventions:   Continue adult regular heart healthy diet  Document % intake of meals in I&O   Document weekly scale weights in flowsheet  Nutrition interventions consistent with goals of care.    Goals:   Maintain >75% intake of meals through LOS - new    Assessment Data:     Hospital Admission: admitted 8/26 for s/s of stroke - MRI yesterday to rule out CVA. Pt w/ hx of HTN, HLD presenting w/ dizziness, headache, vomiting and R leg numbness which brought her to ED.     Nutrition Overview: Pt seen in bed preparing for discharge. States a 10# weight loss over past 2 months from UBW of 155#  d/t decrease of intake and appetite. Per EMR, pt with ongoing, significant weight loss since March. Pt usually consumes a grain/bread for breakfast, juice w/ snack for lunch, and a large dinner of meat w/ starch and or veg. States over last two months she skips breakfast. Recommended intake of supplemental drinks like Ensure to help prevent weight loss on days when appetite is low. Pt politely declined NFPE. Pt meets criteria for malnutrition. Will follow per POC.    History:  Medical History[1]  Past Surgical History[2]    Medications:   Scheduled Medications[3] LR bolus  Infusion Meds[4]  Pertinent PRN meds (given in the past 48 hours): omnipaque      Labs: elevated A1c - see recs.  Recent Labs   Lab 05/29/24  0520 05/28/24  1610 05/28/24  0848   Sodium 143 146* 139   Potassium 5.0 4.2 4.3   Chloride 108 107 105   CO2 26 26 27    BUN 18 21 28*   Creatinine 1.3* 1.2* 1.5*   Glucose 86 87 93   Calcium  9.0 9.6 8.3   Phosphorus 4.1  --   --    GFR 41.3* 45.5* 34.8*   WBC 6.00 7.13 5.50   Hematocrit 34.4* 34.5* 28.6*    Hemoglobin 10.3* 10.7* 8.7*   Triglycerides 47  --   --    Albumin  3.6 4.2 3.4*   Hemoglobin A1C 5.9*  --   --      Recent Labs   Lab 05/28/24  0754   Whole Blood Glucose POCT 80     Diet/Intake:   Orders Placed This Encounter   Procedures    Adult diet Therapeutic/ Modified; Solid; Regular (IDDSI level 7); Thin (IDDSI level 0); Heart healthy     Intake: no recorded intakes    Allergies[5]    Nutrition Focused Physical Exam: Pt declined in context of discharge at this time.  Skin: per EMR, pt w/ nonpitting BLE edema, generalized edema  GI Function: No data recorded, WDL    Anthropometrics: 5/4 height - incorrect, pt of normal BMI. Weight down 13% in 6 months, clinically significant.  Height: 163 cm (5' 4.17)  Weight: 65.6 kg (144 lb 11.2 oz)  Body mass index is 24.7 kg/m.   Weight Monitoring        Height Height Method Weight Weight Method BMI (calculated)   08/06/2023 160 cm  Stated  67.132 kg  Stated  26.2 kg/m2    11/25/2023 160 cm  Stated  71.5 kg  Standing Scale  27.9 kg/m2    12/23/2023 162.6 cm  Stated  76.2 kg  Bed Scale  28.8 kg/m2    02/04/2024 129.5 cm  Stated  75.3 kg  Bed Scale  44.9 kg/m2     134.6 cm   69.899 kg   41.7 kg/m2    05/28/2024 163 cm   66.5 kg  Standing Scale  24.7 kg/m2       65.635 kg            Estimated Needs:   Total Estimated Energy Needs:1640 to 1968 kcal  Method for Calculating Energy Needs: 25 kcal - 30 kcal per kg  at 65.6 kg (Actual body weight)    Total Estimated Protein Needs:65.6 to 131.2 g  Method for Calculating Protein Needs: 1 g - 2 g per kg at 65.6 kg (Actual body weight)    Total Estimated Fluid Needs:1312 to 1640 ml  Method for Calculating Fluid Needs: 20 ml - 25 ml  per kg at 65.6 kg (Actual body weight)    Education Provided: Ensure at home for weight loss prevention.    Nutrition Diagnosis:     Severe Malnutrition--related to inadequate oral intake in the context of acute illness as evidenced by </=75% EER for >/= 1 month and >13% weight loss in 6 months. -  new.    Monitoring/Evaluation:   weight, intake %, labs, meds. Neuro    Asberry Hutch, MS, Registration Eligible  Nutrition Support  [phone]             [1]   Past Medical History:  Diagnosis Date    Breast cancer (CMS/HCC) 2014    right breast mastectomy    Exercise-induced angina     negative work up    GERD (gastroesophageal reflux disease)     Headache     resolved    Hyperlipidemia     Hypertension     Hypothyroidism     Low back pain     Malignant neoplasm of overlapping sites of right female breast (CMS/HCC) 10/19/2015    Syncope and collapse     Chronic Vertigo    Vertigo    [2]   Past Surgical History:  Procedure Laterality Date    COLONOSCOPY, DIAGNOSTIC (SCREENING)  2021    COLONOSCOPY, REMOVAL OF TUMOR, POLYP, OR OTHER LESION BY SNARE TECHNIQUE N/A 07/21/2020    Procedure: COLONOSCOPY, POLYPECTOMY;  Surgeon: Jerri Nicolas, MD;  Location: [facility];  Service: Gastroenterology;  Laterality: N/A;    EGD, BIOPSY N/A 07/21/2020    Procedure: EGD, BIOPSY;  Surgeon: Jerri Nicolas, MD;  Location: [facility];  Service: Gastroenterology;  Laterality: N/A;    EXPLORATORY LAPAROTOMY N/A 05/22/2021    Procedure: EXPLORATORY LAPAROTOMY;  Surgeon: Odean Rollo HERO, MD;  Location: [facility];  Service: General;  Laterality: N/A;    HYSTERECTOMY      LAPAROSCOPY, DIAGNOSTIC N/A 05/22/2021    Procedure: LAPAROSCOPY, DIAGNOSTIC;  Surgeon: Odean Rollo HERO, MD;  Location: [facility];  Service: General;  Laterality: N/A;    LAPAROTOMY, COLECTOMY, RIGHT Right 05/22/2021    Procedure: LAPAROTOMY, COLECTOMY, RIGHT;  Surgeon: Odean Rollo HERO, MD;  Location: [facility];  Service: General;  Laterality: Right;    MASTECTOMY Right 2015   [3]   Current Facility-Administered Medications   Medication Dose    heparin  (porcine)  5,000 Units    lactated ringers   1,000 mL  pantoprazole   40 mg    rosuvastatin   40 mg   [4]   Current Facility-Administered Medications   Medication Dose Route Frequency Last Rate    [5]   Allergies  Allergen Reactions    Morphine  Itching    Morphine  And Codeine  Itching and Nausea And Vomiting     dizzy

## 2024-05-29 NOTE — Progress Notes (Signed)
 05/29/24 1049 05/29/24 1050 05/29/24 1051   Vital Signs   Heart Rate (!) 59 68 85   BP 106/53 128/71 110/63   BP Location Left arm Left arm Left arm   MAP (mmHg) 71 90 79   Patient Position Lying Sitting Standing

## 2024-05-29 NOTE — Plan of Care (Signed)
 The learning abilities of the patient and/or caregiver have been assessed. Today's individualized plan of care includes stroke education, NIH, Q4 neuro and vital sign. The patient or caregiver states the following personal goal related to the patient's deficit(s): By discharge I want to be able to not feel weak. The plan of care was discussed with the patient and/or caregiver, who agrees to it and demonstrates understanding of the disease process, risk factors, treatment plan, medications and consequences of noncompliance. All questions and concerns at this time has been addressed.    Problem: Every Day - Stroke  Goal: Core/Quality measure requirements - Daily  Outcome: Progressing  Flowsheets (Taken 05/29/2024 0143)  Core/Quality measure requirements - Daily:   VTE Prevention: Ensure anticoagulant(s) administered and/or anti-embolism stockings/devices documented by end of day 2   Ensure antithrombotic administered or contraindication documented by LIP by end of day 2   Once lipid panel has resulted, check LDL. Contact provider for statin order if LDL > 70 (or ensure contraindication documented by LIP).   Continue stroke education (must include Modifiable Risk Factors, Warning Signs and Symptoms of Stroke, Activation of Emergency Medical System and Follow-up Appointments). Ensure handout has been given and documented.  Goal: Neurological status is stable or improving  Outcome: Progressing  Flowsheets (Taken 05/29/2024 0143)  Neurological status is stable or improving:   Monitor/assess/document neurological assessment (Stroke: every 4 hours)   Re-assess NIH Stroke Scale for any change in status   Perform CAM Assessment   Monitor/assess NIH Stroke Scale   Observe for seizure activity and initiate seizure precautions if indicated  Goal: Stable vital signs and fluid balance  Outcome: Progressing  Flowsheets (Taken 05/29/2024 0143)  Stable vital signs and fluid balance:   Position patient for maximum circulation/cardiac  output   Monitor and assess vitals every 4 hours or as ordered and hemodynamic parameters   Monitor intake and output. Notify LIP if urine output is < 30 mL/hour.   Encourage oral fluid intake   Apply telemetry monitor as ordered  Goal: Patient will maintain adequate oxygenation  Outcome: Progressing  Flowsheets (Taken 05/29/2024 0143)  Patient will maintain adequate oxygenation: Maintain SpO2 of greater than 92%     Problem: Neurological Deficit  Goal: Neurological status is stable or improving  Outcome: Progressing  Flowsheets (Taken 05/29/2024 0143)  Neurological status is stable or improving:   Monitor/assess/document neurological assessment (Stroke: every 4 hours)   Re-assess NIH Stroke Scale for any change in status   Perform CAM Assessment   Monitor/assess NIH Stroke Scale   Observe for seizure activity and initiate seizure precautions if indicated     Problem: Pain interferes with ability to perform ADL  Goal: Pain at adequate level as identified by patient  Outcome: Progressing  Flowsheets (Taken 05/29/2024 0143)  Pain at adequate level as identified by patient:   Identify patient comfort function goal   Assess pain on admission, during daily assessment and/or before any as needed intervention(s)   Reassess pain within 30-60 minutes of any procedure/intervention, per Pain Assessment, Intervention, Reassessment (AIR) Cycle   Evaluate if patient comfort function goal is met   Evaluate patient's satisfaction with pain management progress   Offer non-pharmacological pain management interventions     Problem: Safety  Goal: Patient will be free from injury during hospitalization  Outcome: Progressing  Flowsheets (Taken 05/29/2024 0143)  Patient will be free from injury during hospitalization:   Hourly rounding   Ensure appropriate safety devices are available at the bedside  Include patient/ family/ care giver in decisions related to safety   Use appropriate transfer methods   Assess patient's risk for falls and  implement fall prevention plan of care per policy   Provide and maintain safe environment

## 2024-05-29 NOTE — OT Progress Note (Signed)
 [facility]  Occupational Therapy Attempt Note    Patient:  Emily [name] MRN#:  [id number]    Unit:  [facility] INTERMEDIATE CARE Room/Bed:  MI622/MI622-01      OT Cancellation: Visit  OT Visit Cancellation Reason: Testing/Procedure. Pt off unit for echo. OT to continue to follow for evaluation completion as schedule allows.            Signature:   Ronal Neas, OTD, OTR/L  05/29/2024  11:55 AM    (For scheduling questions, please contact rehab tech x [phone])

## 2024-05-29 NOTE — OT Eval Note (Signed)
 Occupational Therapy Evaluation  Emily [name]        Post Acute Care Therapy Recommendations:     Discharge Recommendations:  Home with no needs    DME needs IF patient is discharging home: No additional equipment/DME recommended at this time, Patient already has needed equipment    Therapy discharge recommendations may change with patient status.  Please refer to most recent note for up-to-date recommendations.         [facility]  [address]  [place], [place] [address]  [phone]    Occupational Therapy Evaluation    Patient: Emily [name] MRN: [id number]   Unit: [facility] Bed: MI622/MI622-01    Time of treatment:   OT Received On: 05/29/24  Start Time: 1344  Stop Time: 1358  Time Calculation (min): 14 min       Consult received for Emily [name] for OT evaluation and treatment.  Patient's medical condition is appropriate for Occupational Therapy  intervention at this time.    Interpreter utilized: no, not indicated    Assessment     Emily [name] is a 78 y.o. female admitted 05/28/2024 with RLE numbness, dizziness, weakness, headache, vomiting; r/o CVA. MRI(-) acute infarct. Pt with hx HTN, HLD, breast ca s/p R mastectomy, GERD, LBP, chronic vertigo. At baseline pt lives alone in an apartment with 5 STE building with rail, FOS with rail; bathroom setup tub/shower combo with no modifications. Pt PLOF indep for ADLs/IADLs, amb with no AD, does not drive.    Pt received semi-supine in bed, educated on role of OT in acute care setting and agreeable to initial evaluation. Pt reports all symptoms resolved, no difficulty completing ADLs earlier in day. Pt transferred supine>sit indep, completed LBD sock mgmt indep, transferred sit<>Stand indep, amb 100' indep, transferred up/down FOS with rail with mod I. Pt completed hand hygiene standing sinkside indep, returned to sit at EOB to eat lunch, left with needs in place. D/c recommendation to home with no therapy  needs.     Brief chart review completed including review of labs review of imaging review of vitals.  Pt's ability to complete ADLs and functional transfers is at/near fxn'l baseline. Pt does not have any acute OT needs at this time. D/C OT.         Complexity Chart Review Performance Deficits Clinical Decision Making Hx/Comorbidities Assistance needed   Low  Brief  1-3 Limited options None None (or at baseline)       PMP - Progressive Mobility Protocol   PMP Activity: Step 7 - Walks out of Room  Distance Walked (ft) (Step 6,7): 100 Feet     Interdisciplinary Communication: PT, RN, neuro MD    Plan     OT Plan  Risks/Benefits/POC Discussed with Pt/Family: With patient  Treatment Interventions: No skilled interventions needed at this time  Discharge Recommendation: Home with no needs  DME Recommended for Discharge: No additional equipment/DME recommended at this time;Patient already has needed equipment  OT Frequency Recommended: one time visit - therapy discontinued         Medical Diagnosis: Leg numbness [R20.0]    History of Present Illness: Emily [name] is a 77 y.o. female admitted on  05/28/2024 with history of breast cancer, history of hypertension and hyperlipidemia who had symptoms of dizziness and hence came to the emergency room.  Patient went to bed around 10 PM and when she woke up in the morning she felt weak and  dizzy.  Last night also she had a headache and felt weak.  She had an episode of vomiting.  Noted to have right leg numbness.  Patient states her left arm is also weak.  Patient follows up with oncology for hormone therapy for her breast cancer.  Unable to recall the name of the oncologist.  Patient's speech appeared to be slurred not sure if that is her baseline.  No fever or chills.  Patient denies any chest pain or shortness of breath.      Problem List[1]  Medical History[2]  Past Surgical History[3]    Tests/Labs:  Lab Results   Component Value Date/Time    HGB 10.3 (L) 05/29/2024  05:20 AM    HGB 11.1 (L) 01/30/2023 03:56 PM    HCT 34.4 (L) 05/29/2024 05:20 AM    HCT 36.1 01/30/2023 03:56 PM    K 5.0 05/29/2024 05:20 AM    K 4.1 01/30/2023 03:56 PM    NA 143 05/29/2024 05:20 AM    NA 139 01/30/2023 03:56 PM    INR 1.0 05/29/2024 05:20 AM    INR 1.0 01/30/2023 03:56 PM    TROPI <2.7 05/28/2024 08:48 AM    TROPI <2.7 02/04/2024 11:03 AM    TROPI <2.7 11/25/2023 08:00 AM    TROPI 3.7 08/06/2023 09:38 AM    TROPI <2.7 01/30/2023 03:56 PM    TROPI <2.7 11/21/2022 12:16 PM    TROPI <2.7 10/30/2022 03:02 PM    TROPI Unable toCalc. 10/30/2022 03:02 PM    TROPI <2.7 10/30/2022 12:36 PM    TROPI 0.01 07/15/2009 09:40 AM         Imaging:  MRI brain without contrast  Result Date: 05/28/2024   1.No acute infarct, intracranial hemorrhage, or mass effect. 2.Chronic small vessel ischemic changes. Emily Argyle, MD 05/28/2024 10:55 PM    CTA  Head & Neck  Result Date: 05/28/2024  1.Normal CT perfusion examination of the head. There is no territorial perfusion abnormality. 2.There is no stenosis of the proximal right internal carotid artery based on NASCET criteria. 3.There is no stenosis of the proximal left internal carotid artery based on NASCET criteria. 4.The vertebrobasilar arterial system is patent. 5.Normal intracranial CT angiogram. There is no proximal intracranial large vessel occlusion. Emily Arne Jumbo, MD 05/28/2024 9:04 AM    CT Perfusion Brain  Result Date: 05/28/2024  1.Normal CT perfusion examination of the head. There is no territorial perfusion abnormality. 2.There is no stenosis of the proximal right internal carotid artery based on NASCET criteria. 3.There is no stenosis of the proximal left internal carotid artery based on NASCET criteria. 4.The vertebrobasilar arterial system is patent. 5.Normal intracranial CT angiogram. There is no proximal intracranial large vessel occlusion. Emily Arne Jumbo, MD 05/28/2024 9:04 AM    CT Head WO Contrast  Result Date: 05/28/2024   1.Mild  chronic small vessel ischemic disease. Emily Patee, MD 05/28/2024 8:09 AM       Social History:   Lives alone in an apartment.  Entry Steps: 5   Rails: Y Inside steps: FOS  Rails: Y  Equipment at home:  tub/shower combination  Prior Level of Function:      Cognition: intact    Mobility: indep   Feeding: indep   Grooming: indep   Bathing: indep   Dressing: indep   Toileting: indep    Subjective   I have 5 boys, they all came for my birthday last week.  Patient is agreeable to participation in  the therapy session. Nursing clears patient for therapy.  Patient's Goal:  to go home  Pain: pt denies pain and no evidence of pain noted during session    Objective     Precautions:   Precautions  Weight Bearing Status: no restrictions  Other Precautions: falls, aspiration, seizure, skin    Patient is in bed with telemetry and intravenous access in place.       Observation of patient/vitals:   Vitals:    05/29/24 1051 05/29/24 1108 05/29/24 1112 05/29/24 1206   BP: 110/63   116/60   Pulse: 85   (!) 55   Resp:    14   Temp:    97.5 F (36.4 C)   TempSrc:    Oral   SpO2:    99%   Weight:       Height:  1.63 m (5' 4.17) 1.63 m (5' 4.17)        Orientation/Cognition:     Alert and Oriented x 4  Cognition: follows all commands      Musculoskeletal Examination:     ROM Strength   Neck/ Trunk WFL WFL   RUE WFL WFL   LUE WFL WFL         Sensation: Intact to light touch, denies numbness/tingling throughout BUE  Coordination: Intact gross motor and serial opposition to B hands  Vision: WFL  Hearing: WFL      Functional Mobility:    Supine to sit: indep  Sit to Supine: indep  Sit to stand: indep  Stand to sit: indep  Stairs: FOS with R rail mod I  Mobility/Ambulation: 100' indep    Balance:  Static Sit Balance: good  Dynamic Sit Balance: good  Static Stand Balance: good  Dynamic Stand Balance: good    Self Care:  Grooming: indep  LB Dressing: indep      Endurance: good    Participation:  good    Education:  Educated the  Personal assistant to role of occupational therapy, plan of care, goals  of therapy, rationale for progressing mobility and safety with mobility and ADLs.    RN notified of session outcome and that patient was left in bed with all needs met and equipment intact.   Safety measures include: handoff to nurse/clin tech/ unit secretary completed, oriented to call bell and placed within reach, personal items within reach, assistive device positioned out of reach, and bed placed in lowest position.   Mobility and ADL status posted at bedside and within E.M.R.        AM-PACT 6 Clicks Daily Activity Inpatient Short Form  Inpatient AM-PACT Performed?: yes  Put On/Take Off Lower Body Clothing: 4  Assist with Bathing: 4  Assist with Toileting: 4  Put On/Take Off Upper Body Clothing: 4  Assist with Grooming: 4  Assist with Eating: 4  OT Daily Activity Raw Score: 24  CMS 0-100% Score: 0.00%       Goals:  Goals  Goal Formulation: Patient  Time For Goal Achievement: other (comment) (goals not indicated)    Modified Rankin Scale   I am certified in mRS: Yes  mRS Assessment Source: Other (comment) (per OT per pt)  mRS Value: 0 - No symptoms    Therapist PPE during session procedural mask and gloves      Signature:   Emily Neas, OTD, OTR/L  05/29/2024  2:53 PM    (For scheduling questions, please contact rehab tech x [phone])     Attention MD:  Thank you for allowing us  to participate in the care of [facility]. Regulations from the Center for Medicare and Medicaid Services (CMS) require your review and approval of this plan of care.     Please cosign this note indicating you are in agreement with the Therapy Plan of Care so we may initiate the therapy treatment plan.         [1]   Patient Active Problem List  Diagnosis    Malignant neoplasm of overlapping sites of right female breast (CMS/HCC)    Hypertension    Mixed hyperlipidemia    Hypothyroid    Left-sided weakness    Right sided numbness    COVID-19 ruled out  by laboratory testing    Abnormal CT scan, gastrointestinal tract    History of hypothyroidism    History of breast cancer    History of colonic polyps    Dizziness    Left sided numbness    Leg numbness   [2]   Past Medical History:  Diagnosis Date    Breast cancer (CMS/HCC) 2014    right breast mastectomy    Exercise-induced angina     negative work up    GERD (gastroesophageal reflux disease)     Headache     resolved    Hyperlipidemia     Hypertension     Hypothyroidism     Low back pain     Malignant neoplasm of overlapping sites of right female breast (CMS/HCC) 10/19/2015    Syncope and collapse     Chronic Vertigo    Vertigo    [3]   Past Surgical History:  Procedure Laterality Date    COLONOSCOPY, DIAGNOSTIC (SCREENING)  2021    COLONOSCOPY, REMOVAL OF TUMOR, POLYP, OR OTHER LESION BY SNARE TECHNIQUE N/A 07/21/2020    Procedure: COLONOSCOPY, POLYPECTOMY;  Surgeon: Jerri Nicolas, MD;  Location: [facility];  Service: Gastroenterology;  Laterality: N/A;    EGD, BIOPSY N/A 07/21/2020    Procedure: EGD, BIOPSY;  Surgeon: Jerri Nicolas, MD;  Location: [facility];  Service: Gastroenterology;  Laterality: N/A;    EXPLORATORY LAPAROTOMY N/A 05/22/2021    Procedure: EXPLORATORY LAPAROTOMY;  Surgeon: Odean Rollo HERO, MD;  Location: [facility];  Service: General;  Laterality: N/A;    HYSTERECTOMY      LAPAROSCOPY, DIAGNOSTIC N/A 05/22/2021    Procedure: LAPAROSCOPY, DIAGNOSTIC;  Surgeon: Odean Rollo HERO, MD;  Location: [facility];  Service: General;  Laterality: N/A;    LAPAROTOMY, COLECTOMY, RIGHT Right 05/22/2021    Procedure: LAPAROTOMY, COLECTOMY, RIGHT;  Surgeon: Odean Rollo HERO, MD;  Location: [facility];  Service: General;  Laterality: Right;    MASTECTOMY Right 2015

## 2024-05-29 NOTE — Progress Notes (Signed)
 [place] Plan: patient plans to discharge back home with self care.    [place] Transportation: patient reports that she will take, lyft home at discharge  Note: Pt was interviewed at her bedside. She is a 78 yo mF that lives alone and shared that she is concerned her family wants her to live in an ALF. She reports that she wants to continue to live independently. She does all of her shopping and gets to all her medical appointments without assistance. Pt was given a handout for medication assistance in the community at discharge.          05/29/24 1600   Patient Type   Within 30 Days of Previous Admission? No   Healthcare Decisions   Interviewed: Patient   Orientation/Decision Making Abilities of Patient Alert and Oriented x3, able to make decisions   Advance Directive Patient does not have advance directive   Prior to admission   Prior level of function Independent with ADLs   Type of Residence Private residence   Home Layout One level   Have running water , electricity, heat, etc? Yes   Living Arrangements Alone   How do you get to your MD appointments? self   How do you get your groceries? self   Who fixes your meals? self   Who does your laundry? self   Who picks up your prescriptions? self   Dressing Independent   Grooming Independent   Feeding Independent   Bathing Independent   Toileting Independent   DME Currently at Home BP Cuff   Home Care/Community Services None   Discharge Planning   Support Systems None   Patient expects to be discharged to: discharge back home with self care   [place]olliday discussion contact information: self   Mode of transportation: Taxi/taxi voucher   Does the patient have perscription coverage? No   Financial Resource Strain   How hard is it for you to pay for the very basics like food, housing, medical care, and heating? Not hard   Housing Stability   In the last 12 months, was there a time when you were not able to pay the mortgage or rent on time? N   At any time in the past 12 months, were you  homeless or living in a shelter (including now)? N   Transportation Needs   In the past 12 months, has lack of transportation kept you from medical appointments or from getting medications? no   In the past 12 months, has lack of transportation kept you from meetings, work, or from getting things needed for daily living? No   Family and PCP   PCP on file was verified as the current PCP? Yes   In case you are admitted, transferred or discharged, would like family notified? No

## 2024-05-29 NOTE — Plan of Care (Signed)
 Problem: Moderate/High Fall Risk Score >5  Goal: Patient will remain free of falls  Outcome: Progressing     Problem: Day of Admission - Stroke  Goal: Core/Quality measure requirements - Admission  Outcome: Progressing  Flowsheets (Taken 05/28/2024 1449 by Roberto Elenor Norris, RN)  Core/Quality measure requirements - Admission:   Document NIH Stroke Scale on admission   Document nursing swallow/dysphagia screen on admission. If patient fails, keep patient NPO (follow your hospital protocol on swallowing screening).   VTE Prevention: Ensure anticoagulant(s) administered and/or anti-embolism stockings/devices documented as ordered   Ensure antithrombotic administered or contraindication documented by LIP   Ensure lipid panel ordered   Begin stroke education on admission (must include Modifiable Risk Factors, Warning Signs and Symptoms of Stroke, Activation of Emergency Medical System and Follow-up Appointments) Ensure handout has been given and documented.   If diagnosis or history of Atrial Fib/Atrial Flutter, ensure oral anticoagulation is initiated or contraindication documented by LIP   Ensure PT/OT and/or SLP ordered     Problem: Every Day - Stroke  Goal: Core/Quality measure requirements - Daily  Outcome: Progressing  Flowsheets (Taken 05/29/2024 0143 by Jadene Kaufmann, RN)  Core/Quality measure requirements - Daily:   VTE Prevention: Ensure anticoagulant(s) administered and/or anti-embolism stockings/devices documented by end of day 2   Ensure antithrombotic administered or contraindication documented by LIP by end of day 2   Once lipid panel has resulted, check LDL. Contact provider for statin order if LDL > 70 (or ensure contraindication documented by LIP).   Continue stroke education (must include Modifiable Risk Factors, Warning Signs and Symptoms of Stroke, Activation of Emergency Medical System and Follow-up Appointments). Ensure handout has been given and documented.  Goal: Neurological status is  stable or improving  Outcome: Progressing  Flowsheets (Taken 05/29/2024 0143 by Jadene Kaufmann, RN)  Neurological status is stable or improving:   Monitor/assess/document neurological assessment (Stroke: every 4 hours)   Re-assess NIH Stroke Scale for any change in status   Perform CAM Assessment   Monitor/assess NIH Stroke Scale   Observe for seizure activity and initiate seizure precautions if indicated  Goal: Stable vital signs and fluid balance  Outcome: Progressing  Flowsheets (Taken 05/29/2024 0143 by Jadene Kaufmann, RN)  Stable vital signs and fluid balance:   Position patient for maximum circulation/cardiac output   Monitor and assess vitals every 4 hours or as ordered and hemodynamic parameters   Monitor intake and output. Notify LIP if urine output is < 30 mL/hour.   Encourage oral fluid intake   Apply telemetry monitor as ordered  Goal: Patient will maintain adequate oxygenation  Outcome: Progressing  Flowsheets (Taken 05/29/2024 0143 by Jadene Kaufmann, RN)  Patient will maintain adequate oxygenation: Maintain SpO2 of greater than 92%  Goal: Patient's risk of aspiration will be minimized  Outcome: Progressing  Flowsheets (Taken 05/28/2024 1449 by Roberto Elenor Norris, RN)  Patient's risk of aspiration will be minimized:   Complete new dysphagia screen for any change in status: Keep patient NPO if patient fails   Place swallow precaution signage above bed   Monitor/assess for signs of aspiration (tachypnea, cough, wheezing, clearing throat, hoarseness after eating, decrease in SaO2   Order modified texture diet as recommend by Speech Pathologist   Thicken liquids as recommended by Speech Pathologist   Keep head of bed up a minimum of 30 degrees when hemodynamically stable   Place patient up in chair to eat, if possible   Instruct patient to take small bites  Consult/Collaborate with Speech Pathologist for dysphagia   Assess and monitor ability to swallow   HOB up 90 degrees to eat if unable to be OOB    Instruct patient to take small single sips of liquid   Supervise patient during oral intake   Do not use a straw  Goal: Nutritional intake is adequate  Outcome: Progressing  Goal: Elimination patterns are normal or improving  Outcome: Progressing  Flowsheets (Taken 05/28/2024 1449 by Roberto Elenor Norris, RN)  Elimination patterns are normal or improving: Assess for and discuss C. diff screening with LIP  Goal: Mobility/Activity is maintained at optimal level for patient  Outcome: Progressing  Flowsheets (Taken 05/28/2024 1449 by Roberto Elenor Norris, RN)  Mobility/activity is maintained at optimal level for patient:   Encourage independent activity per ability   Consult/collaborate with Physical Therapy and/or Occupational Therapy  Goal: Skin integrity is maintained or improved  Outcome: Progressing  Goal: Neurovascular status is stable or improving  Outcome: Progressing  Goal: Effective coping demonstrated  Outcome: Progressing  Flowsheets (Taken 05/28/2024 1449 by Roberto Elenor Norris, RN)  Effective coping demonstrated:   Assess/report to LIP uncontrolled anxiety, depression, or ineffective coping   Offer reassurance to decrease anxiety  Goal: Will be able to express needs and understand communication  Outcome: Progressing     Problem: Day of Discharge - Stroke  Goal: Able to express understanding of discharge instructions and stroke education  Outcome: Progressing  Goal: Core/Quality measures - Discharge  Outcome: Progressing     Problem: Neurological Deficit  Goal: Neurological status is stable or improving  Outcome: Progressing  Flowsheets (Taken 05/29/2024 0143 by Jadene Kaufmann, RN)  Neurological status is stable or improving:   Monitor/assess/document neurological assessment (Stroke: every 4 hours)   Re-assess NIH Stroke Scale for any change in status   Perform CAM Assessment   Monitor/assess NIH Stroke Scale   Observe for seizure activity and initiate seizure precautions if indicated      Problem: Potential for Aspiration  Goal: Risk of aspiration will be minimized  Outcome: Progressing     Problem: Peripheral Neurovascular Impairment  Goal: Extremity color, movement, sensation are maintained or improved  Outcome: Progressing     Problem: Impaired Mobility  Goal: Mobility/Activity is maintained at optimal level for patient  Outcome: Progressing  Flowsheets (Taken 05/28/2024 1449 by Roberto Elenor Norris, RN)  Mobility/activity is maintained at optimal level for patient:   Encourage independent activity per ability   Consult/collaborate with Physical Therapy and/or Occupational Therapy     Problem: Communication Impairment  Goal: Will be able to express needs and understand communication  Outcome: Progressing     Problem: Pain interferes with ability to perform ADL  Goal: Pain at adequate level as identified by patient  Outcome: Progressing     Problem: Side Effects from Pain Analgesia  Goal: Patient will experience minimal side effects of analgesic therapy  Outcome: Progressing     Problem: Safety  Goal: Patient will be free from injury during hospitalization  Outcome: Progressing  Goal: Patient will be free from infection during hospitalization  Outcome: Progressing     Problem: Compromised Sensory Perception  Goal: Sensory Perception Interventions  Outcome: Progressing     Problem: Compromised Activity/Mobility  Goal: Activity/Mobility Interventions  Outcome: Progressing

## 2024-05-29 NOTE — Nursing Progress Note (Signed)
Discharge orders received from physician. Patient was given discharge instructions and verbalized understanding of all instructions, medications, and follow-ups necessary. The patients IV was discontinued without complication. The patient is leaving the unit in stable condition at this time with all of her belongings.

## 2024-05-29 NOTE — Plan of Care (Signed)
 Problem: Moderate/High Fall Risk Score >5  Goal: Patient will remain free of falls  05/29/2024 1606 by Yale Crank, RN  Outcome: Adequate for Discharge  05/29/2024 1011 by Yale Crank, RN  Outcome: Progressing     Problem: Day of Admission - Stroke  Goal: Core/Quality measure requirements - Admission  05/29/2024 1606 by Yale Crank, RN  Outcome: Adequate for Discharge  05/29/2024 1011 by Yale Crank, RN  Outcome: Progressing  Flowsheets (Taken 05/28/2024 1449 by Roberto Elenor Norris, RN)  Core/Quality measure requirements - Admission:   Document NIH Stroke Scale on admission   Document nursing swallow/dysphagia screen on admission. If patient fails, keep patient NPO (follow your hospital protocol on swallowing screening).   VTE Prevention: Ensure anticoagulant(s) administered and/or anti-embolism stockings/devices documented as ordered   Ensure antithrombotic administered or contraindication documented by LIP   Ensure lipid panel ordered   Begin stroke education on admission (must include Modifiable Risk Factors, Warning Signs and Symptoms of Stroke, Activation of Emergency Medical System and Follow-up Appointments) Ensure handout has been given and documented.   If diagnosis or history of Atrial Fib/Atrial Flutter, ensure oral anticoagulation is initiated or contraindication documented by LIP   Ensure PT/OT and/or SLP ordered     Problem: Every Day - Stroke  Goal: Core/Quality measure requirements - Daily  05/29/2024 1606 by Yale Crank, RN  Outcome: Adequate for Discharge  05/29/2024 1011 by Yale Crank, RN  Outcome: Progressing  Flowsheets (Taken 05/29/2024 0143 by Jadene Kaufmann, RN)  Core/Quality measure requirements - Daily:   VTE Prevention: Ensure anticoagulant(s) administered and/or anti-embolism stockings/devices documented by end of day 2   Ensure antithrombotic administered or contraindication documented by LIP by end of day 2   Once lipid  panel has resulted, check LDL. Contact provider for statin order if LDL > 70 (or ensure contraindication documented by LIP).   Continue stroke education (must include Modifiable Risk Factors, Warning Signs and Symptoms of Stroke, Activation of Emergency Medical System and Follow-up Appointments). Ensure handout has been given and documented.  Goal: Neurological status is stable or improving  05/29/2024 1606 by Yale Crank, RN  Outcome: Adequate for Discharge  05/29/2024 1011 by Yale Crank, RN  Outcome: Progressing  Flowsheets (Taken 05/29/2024 0143 by Jadene Kaufmann, RN)  Neurological status is stable or improving:   Monitor/assess/document neurological assessment (Stroke: every 4 hours)   Re-assess NIH Stroke Scale for any change in status   Perform CAM Assessment   Monitor/assess NIH Stroke Scale   Observe for seizure activity and initiate seizure precautions if indicated  Goal: Stable vital signs and fluid balance  05/29/2024 1606 by Yale Crank, RN  Outcome: Adequate for Discharge  05/29/2024 1011 by Yale Crank, RN  Outcome: Progressing  Flowsheets (Taken 05/29/2024 0143 by Jadene Kaufmann, RN)  Stable vital signs and fluid balance:   Position patient for maximum circulation/cardiac output   Monitor and assess vitals every 4 hours or as ordered and hemodynamic parameters   Monitor intake and output. Notify LIP if urine output is < 30 mL/hour.   Encourage oral fluid intake   Apply telemetry monitor as ordered  Goal: Patient will maintain adequate oxygenation  05/29/2024 1606 by Yale Crank, RN  Outcome: Adequate for Discharge  05/29/2024 1011 by Yale Crank, RN  Outcome: Progressing  Flowsheets (Taken 05/29/2024 0143 by Jadene Kaufmann, RN)  Patient will maintain adequate oxygenation: Maintain SpO2 of greater than 92%  Goal: Patient's risk of aspiration will be minimized  05/29/2024 1606 by Yale Crank,  RN  Outcome: Adequate for  Discharge  05/29/2024 1011 by Yale Crank, RN  Outcome: Progressing  Flowsheets (Taken 05/28/2024 1449 by Roberto Elenor Norris, RN)  Patient's risk of aspiration will be minimized:   Complete new dysphagia screen for any change in status: Keep patient NPO if patient fails   Place swallow precaution signage above bed   Monitor/assess for signs of aspiration (tachypnea, cough, wheezing, clearing throat, hoarseness after eating, decrease in SaO2   Order modified texture diet as recommend by Speech Pathologist   Thicken liquids as recommended by Speech Pathologist   Keep head of bed up a minimum of 30 degrees when hemodynamically stable   Place patient up in chair to eat, if possible   Instruct patient to take small bites   Consult/Collaborate with Speech Pathologist for dysphagia   Assess and monitor ability to swallow   HOB up 90 degrees to eat if unable to be OOB   Instruct patient to take small single sips of liquid   Supervise patient during oral intake   Do not use a straw  Goal: Nutritional intake is adequate  05/29/2024 1606 by Yale Crank, RN  Outcome: Adequate for Discharge  05/29/2024 1011 by Yale Crank, RN  Outcome: Progressing  Goal: Elimination patterns are normal or improving  05/29/2024 1606 by Yale Crank, RN  Outcome: Adequate for Discharge  05/29/2024 1011 by Yale Crank, RN  Outcome: Progressing  Flowsheets (Taken 05/28/2024 1449 by Roberto Elenor Norris, RN)  Elimination patterns are normal or improving: Assess for and discuss C. diff screening with LIP  Goal: Mobility/Activity is maintained at optimal level for patient  05/29/2024 1606 by Yale Crank, RN  Outcome: Adequate for Discharge  05/29/2024 1011 by Yale Crank, RN  Outcome: Progressing  Flowsheets (Taken 05/28/2024 1449 by Roberto Elenor Norris, RN)  Mobility/activity is maintained at optimal level for patient:   Encourage independent activity per ability    Consult/collaborate with Physical Therapy and/or Occupational Therapy  Goal: Skin integrity is maintained or improved  05/29/2024 1606 by Yale Crank, RN  Outcome: Adequate for Discharge  05/29/2024 1011 by Yale Crank, RN  Outcome: Progressing  Goal: Neurovascular status is stable or improving  05/29/2024 1606 by Yale Crank, RN  Outcome: Adequate for Discharge  05/29/2024 1011 by Yale Crank, RN  Outcome: Progressing  Goal: Effective coping demonstrated  05/29/2024 1606 by Yale Crank, RN  Outcome: Adequate for Discharge  05/29/2024 1011 by Yale Crank, RN  Outcome: Progressing  Flowsheets (Taken 05/28/2024 1449 by Roberto Elenor Norris, RN)  Effective coping demonstrated:   Assess/report to LIP uncontrolled anxiety, depression, or ineffective coping   Offer reassurance to decrease anxiety  Goal: Will be able to express needs and understand communication  05/29/2024 1606 by Yale Crank, RN  Outcome: Adequate for Discharge  05/29/2024 1011 by Yale Crank, RN  Outcome: Progressing     Problem: Day of Discharge - Stroke  Goal: Able to express understanding of discharge instructions and stroke education  05/29/2024 1606 by Yale Crank, RN  Outcome: Adequate for Discharge  05/29/2024 1011 by Yale Crank, RN  Outcome: Progressing  Goal: Core/Quality measures - Discharge  05/29/2024 1606 by Yale Crank, RN  Outcome: Adequate for Discharge  05/29/2024 1011 by Yale Crank, RN  Outcome: Progressing     Problem: Neurological Deficit  Goal: Neurological status is stable or improving  05/29/2024 1606 by Yale Crank, RN  Outcome: Adequate for Discharge  05/29/2024 1011 by Yale Crank, RN  Outcome: Progressing  Flowsheets (Taken 05/29/2024 0143 by Jadene Kaufmann, RN)  Neurological status is stable or improving:   Monitor/assess/document neurological assessment (Stroke: every 4 hours)    Re-assess NIH Stroke Scale for any change in status   Perform CAM Assessment   Monitor/assess NIH Stroke Scale   Observe for seizure activity and initiate seizure precautions if indicated     Problem: Potential for Aspiration  Goal: Risk of aspiration will be minimized  05/29/2024 1606 by Yale Crank, RN  Outcome: Adequate for Discharge  05/29/2024 1011 by Yale Crank, RN  Outcome: Progressing     Problem: Peripheral Neurovascular Impairment  Goal: Extremity color, movement, sensation are maintained or improved  05/29/2024 1606 by Yale Crank, RN  Outcome: Adequate for Discharge  05/29/2024 1011 by Yale Crank, RN  Outcome: Progressing     Problem: Impaired Mobility  Goal: Mobility/Activity is maintained at optimal level for patient  05/29/2024 1606 by Yale Crank, RN  Outcome: Adequate for Discharge  05/29/2024 1011 by Yale Crank, RN  Outcome: Progressing  Flowsheets (Taken 05/28/2024 1449 by Roberto Elenor Norris, RN)  Mobility/activity is maintained at optimal level for patient:   Encourage independent activity per ability   Consult/collaborate with Physical Therapy and/or Occupational Therapy     Problem: Communication Impairment  Goal: Will be able to express needs and understand communication  05/29/2024 1606 by Yale Crank, RN  Outcome: Adequate for Discharge  05/29/2024 1011 by Yale Crank, RN  Outcome: Progressing     Problem: Pain interferes with ability to perform ADL  Goal: Pain at adequate level as identified by patient  05/29/2024 1606 by Yale Crank, RN  Outcome: Adequate for Discharge  05/29/2024 1011 by Yale Crank, RN  Outcome: Progressing     Problem: Side Effects from Pain Analgesia  Goal: Patient will experience minimal side effects of analgesic therapy  05/29/2024 1606 by Yale Crank, RN  Outcome: Adequate for Discharge  05/29/2024 1011 by Yale Crank, RN  Outcome:  Progressing     Problem: Safety  Goal: Patient will be free from injury during hospitalization  05/29/2024 1606 by Yale Crank, RN  Outcome: Adequate for Discharge  05/29/2024 1011 by Yale Crank, RN  Outcome: Progressing  Goal: Patient will be free from infection during hospitalization  05/29/2024 1606 by Yale Crank, RN  Outcome: Adequate for Discharge  05/29/2024 1011 by Yale Crank, RN  Outcome: Progressing     Problem: Compromised Sensory Perception  Goal: Sensory Perception Interventions  05/29/2024 1606 by Yale Crank, RN  Outcome: Adequate for Discharge  05/29/2024 1011 by Yale Crank, RN  Outcome: Progressing     Problem: Compromised Activity/Mobility  Goal: Activity/Mobility Interventions  05/29/2024 1606 by Yale Crank, RN  Outcome: Adequate for Discharge  05/29/2024 1011 by Yale Crank, RN  Outcome: Progressing

## 2024-05-29 NOTE — PT Progress Note (Signed)
 [facility]  Physical Therapy Attempt Note    Patient:  Emily [name] MRN#:  [id number]    Unit:  [facility] INTERMEDIATE CARE Room/Bed:  MI622/MI622-01    PT Cancellation: Order     PT Order Cancellation Reason: Not clinically indicated at this time (comment required) - Rehab decision (screened by OT)    1402:  PT eval orders received. Chart review completed. Patient not seen for physical therapy evaluation as pt is at/near functional baseline of independent with all transfers, and mobility (including stairs) per discussion with treating OT. Per OT, pt asymptomatic. Stroke work-up (-) per chart review. No acute PT needs identified, d/c PT. Will need new orders if functional status changes.      Signature:   Anastasia Sofia, PT, DPT  05/29/2024  2:16 PM       (For scheduling questions, please contact rehab tech x [phone])

## 2024-05-29 NOTE — Progress Notes (Signed)
 05/29/24 1618   Medicare Checklist   Is this a Medicare patient? Yes   Discharge Disposition   Patient preference/choice provided? N/A   Physical Discharge Disposition Home   Receiving facility, unit and room number: N/A   Nursing report phone number: N/A   Mode of Transportation   (lyft ride)   Patient/Family/POA notified of transfer plan Yes   Patient agreeable to discharge plan/expected d/c date? Yes   Bedside nurse notified of transport plan? Yes   CM Interventions   Follow up appointment scheduled?(For PNA, COPD, MI) Yes   Referral made for home health RN visit? Yes   Multidisciplinary rounds/family meeting before d/c? Yes     DCP: Home alone, with support from family, Transportation at [employer]

## 2024-06-07 ENCOUNTER — Emergency Department

## 2024-06-07 ENCOUNTER — Observation Stay

## 2024-06-07 ENCOUNTER — Observation Stay
Admission: EM | Admit: 2024-06-07 | Discharge: 2024-06-09 | Disposition: A | Discharge status: Home / Self Care | Attending: Student in an Organized Health Care Education/Training Program | Admitting: Student in an Organized Health Care Education/Training Program

## 2024-06-07 DIAGNOSIS — Z79890 Hormone replacement therapy (postmenopausal): Secondary | ICD-10-CM | POA: Insufficient documentation

## 2024-06-07 DIAGNOSIS — Z79811 Long term (current) use of aromatase inhibitors: Secondary | ICD-10-CM | POA: Insufficient documentation

## 2024-06-07 DIAGNOSIS — N179 Acute kidney failure, unspecified: Principal | ICD-10-CM | POA: Diagnosis present

## 2024-06-07 DIAGNOSIS — S0990XA Unspecified injury of head, initial encounter: Secondary | ICD-10-CM | POA: Insufficient documentation

## 2024-06-07 DIAGNOSIS — I129 Hypertensive chronic kidney disease with stage 1 through stage 4 chronic kidney disease, or unspecified chronic kidney disease: Secondary | ICD-10-CM | POA: Insufficient documentation

## 2024-06-07 DIAGNOSIS — E870 Hyperosmolality and hypernatremia: Secondary | ICD-10-CM | POA: Insufficient documentation

## 2024-06-07 DIAGNOSIS — Z6826 Body mass index (BMI) 26.0-26.9, adult: Secondary | ICD-10-CM | POA: Insufficient documentation

## 2024-06-07 DIAGNOSIS — L731 Pseudofolliculitis barbae: Secondary | ICD-10-CM | POA: Insufficient documentation

## 2024-06-07 DIAGNOSIS — Z79899 Other long term (current) drug therapy: Secondary | ICD-10-CM | POA: Insufficient documentation

## 2024-06-07 DIAGNOSIS — N182 Chronic kidney disease, stage 2 (mild): Secondary | ICD-10-CM | POA: Insufficient documentation

## 2024-06-07 DIAGNOSIS — B3731 Acute candidiasis of vulva and vagina: Secondary | ICD-10-CM | POA: Insufficient documentation

## 2024-06-07 DIAGNOSIS — E782 Mixed hyperlipidemia: Secondary | ICD-10-CM

## 2024-06-07 DIAGNOSIS — D649 Anemia, unspecified: Secondary | ICD-10-CM | POA: Insufficient documentation

## 2024-06-07 DIAGNOSIS — M542 Cervicalgia: Secondary | ICD-10-CM | POA: Insufficient documentation

## 2024-06-07 DIAGNOSIS — E663 Overweight: Secondary | ICD-10-CM | POA: Insufficient documentation

## 2024-06-07 DIAGNOSIS — E039 Hypothyroidism, unspecified: Secondary | ICD-10-CM | POA: Insufficient documentation

## 2024-06-07 DIAGNOSIS — K8020 Calculus of gallbladder without cholecystitis without obstruction: Secondary | ICD-10-CM | POA: Insufficient documentation

## 2024-06-07 DIAGNOSIS — R200 Anesthesia of skin: Secondary | ICD-10-CM | POA: Insufficient documentation

## 2024-06-07 DIAGNOSIS — W19XXXA Unspecified fall, initial encounter: Secondary | ICD-10-CM | POA: Insufficient documentation

## 2024-06-07 DIAGNOSIS — I10 Essential (primary) hypertension: Secondary | ICD-10-CM

## 2024-06-07 DIAGNOSIS — Z853 Personal history of malignant neoplasm of breast: Secondary | ICD-10-CM | POA: Insufficient documentation

## 2024-06-07 DIAGNOSIS — N281 Cyst of kidney, acquired: Secondary | ICD-10-CM | POA: Insufficient documentation

## 2024-06-07 DIAGNOSIS — E860 Dehydration: Secondary | ICD-10-CM | POA: Insufficient documentation

## 2024-06-07 DIAGNOSIS — R296 Repeated falls: Secondary | ICD-10-CM | POA: Insufficient documentation

## 2024-06-07 DIAGNOSIS — R42 Dizziness and giddiness: Principal | ICD-10-CM | POA: Insufficient documentation

## 2024-06-07 DIAGNOSIS — R079 Chest pain, unspecified: Secondary | ICD-10-CM | POA: Insufficient documentation

## 2024-06-07 DIAGNOSIS — Z7982 Long term (current) use of aspirin: Secondary | ICD-10-CM | POA: Insufficient documentation

## 2024-06-07 LAB — COMPREHENSIVE METABOLIC PANEL
ALT: 28 U/L (ref <=55)
AST (SGOT): 38 U/L (ref <=41)
Albumin/Globulin Ratio: 1.3 (ref 0.9–2.2)
Albumin: 4 g/dL (ref 3.5–5.0)
Alkaline Phosphatase: 82 U/L (ref 37–117)
Anion Gap: 11 (ref 5.0–15.0)
BUN: 23 mg/dL — ABNORMAL HIGH (ref 7–21)
Bilirubin, Total: 0.3 mg/dL (ref 0.2–1.2)
CO2: 27 meq/L (ref 17–29)
Calcium: 9.3 mg/dL (ref 7.9–10.2)
Chloride: 109 meq/L (ref 99–111)
Creatinine: 2 mg/dL — ABNORMAL HIGH (ref 0.4–1.0)
GFR: 24.6 mL/min/1.73 m2 — ABNORMAL LOW (ref >=60.0)
Globulin: 3 g/dL (ref 2.0–3.6)
Glucose: 93 mg/dL (ref 70–100)
Potassium: 4.1 meq/L (ref 3.5–5.3)
Protein, Total: 7 g/dL (ref 6.0–8.3)
Sodium: 147 meq/L — ABNORMAL HIGH (ref 135–145)

## 2024-06-07 LAB — URINALYSIS WITH REFLEX TO MICROSCOPIC EXAM - REFLEX TO CULTURE
Urine Bilirubin: NEGATIVE
Urine Blood: NEGATIVE
Urine Glucose: NEGATIVE
Urine Ketones: NEGATIVE mg/dL
Urine Nitrite: NEGATIVE
Urine Protein: NEGATIVE
Urine Specific Gravity: 1.004 (ref 1.001–1.035)
Urine Urobilinogen: NORMAL mg/dL (ref 0.2–2.0)
Urine pH: 6 (ref 5.0–8.0)

## 2024-06-07 LAB — VAGINAL BACTERIAL VAGINOSIS, CANDIDA SPP., AND TRICHOMONAS VAGINALIS, PCR
Bacterial vaginosis marker DNA: NOT DETECTED
Candida glabrata DNA: NOT DETECTED
Candida group DNA: DETECTED — AB
Candida krusei DNA: NOT DETECTED
Trichomonas vaginalis DNA: NOT DETECTED

## 2024-06-07 LAB — LAB USE ONLY - CBC WITH DIFFERENTIAL
Absolute Basophils: 0.04 x10 3/uL (ref 0.00–0.08)
Absolute Eosinophils: 0.17 x10 3/uL (ref 0.00–0.44)
Absolute Immature Granulocytes: 0.01 x10 3/uL (ref 0.00–0.07)
Absolute Lymphocytes: 2.2 x10 3/uL (ref 0.42–3.22)
Absolute Monocytes: 0.5 x10 3/uL (ref 0.21–0.85)
Absolute Neutrophils: 3.8 x10 3/uL (ref 1.10–6.33)
Absolute nRBC: 0 x10 3/uL (ref <=0.00)
Basophils %: 0.6 %
Eosinophils %: 2.5 %
Hematocrit: 33.6 % — ABNORMAL LOW (ref 34.7–43.7)
Hemoglobin: 10.2 g/dL — ABNORMAL LOW (ref 11.4–14.8)
Immature Granulocytes %: 0.1 %
Lymphocytes %: 32.7 %
MCH: 26.8 pg (ref 25.1–33.5)
MCHC: 30.4 g/dL — ABNORMAL LOW (ref 31.5–35.8)
MCV: 88.4 fL (ref 78.0–96.0)
MPV: 10.7 fL (ref 8.9–12.5)
Monocytes %: 7.4 %
Neutrophils %: 56.7 %
Platelet Count: 262 x10 3/uL (ref 142–346)
Preliminary Absolute Neutrophil Count: 3.8 x10 3/uL (ref 1.10–6.33)
RBC: 3.8 x10 6/uL — ABNORMAL LOW (ref 3.90–5.10)
RDW: 14 % (ref 11–15)
WBC: 6.72 x10 3/uL (ref 3.10–9.50)
nRBC %: 0 /100{WBCs} (ref <=0.0)

## 2024-06-07 LAB — ECG 12-LEAD
Atrial Rate: 78 {beats}/min
IHS MUSE NARRATIVE AND IMPRESSION: NORMAL
P Axis: 65 degrees
P-R Interval: 196 ms
Q-T Interval: 378 ms
QRS Duration: 86 ms
QTC Calculation (Bezet): 430 ms
R Axis: 56 degrees
T Axis: 23 degrees
Ventricular Rate: 78 {beats}/min

## 2024-06-07 LAB — COVID-19 (SARS-COV-2) & INFLUENZA  A/B, NAA (ROCHE LIAT)
Influenza A RNA: NOT DETECTED
Influenza B RNA: NOT DETECTED
SARS-CoV-2 (COVID-19) RNA: NOT DETECTED

## 2024-06-07 LAB — PT AND APTT
INR: 0.9 (ref 0.9–1.1)
PT: 10.6 s (ref 10.1–12.9)
PTT: 35 s (ref 27–39)

## 2024-06-07 LAB — LAB USE ONLY - URINE GRAY CULTURE HOLD TUBE

## 2024-06-07 MED ORDER — ANASTROZOLE 1 MG PO TABS
1.0000 mg | ORAL_TABLET | Freq: Every morning | ORAL | Status: DC
  Administered 2024-06-08 – 2024-06-09 (×2): 1 mg via ORAL
  Filled 2024-06-07 (×3): qty 1

## 2024-06-07 MED ORDER — ROSUVASTATIN CALCIUM 10 MG PO TABS
10.0000 mg | ORAL_TABLET | Freq: Every day | ORAL | Status: DC
Start: 2024-06-08 — End: 2024-06-09
  Administered 2024-06-08 – 2024-06-09 (×2): 10 mg via ORAL
  Filled 2024-06-07 (×2): qty 1

## 2024-06-07 MED ORDER — ACETAMINOPHEN 500 MG PO TABS
500.0000 mg | ORAL_TABLET | ORAL | Status: DC | PRN
Start: 2024-06-07 — End: 2024-06-09
  Administered 2024-06-07 – 2024-06-08 (×3): 500 mg via ORAL
  Filled 2024-06-07 (×3): qty 1

## 2024-06-07 MED ORDER — CARBOXYMETHYLCELLULOSE SOD PF 0.5 % OP SOLN
1.0000 [drp] | Freq: Three times a day (TID) | OPHTHALMIC | Status: DC | PRN
Start: 2024-06-07 — End: 2024-06-09

## 2024-06-07 MED ORDER — BENZONATATE 100 MG PO CAPS
100.0000 mg | ORAL_CAPSULE | Freq: Three times a day (TID) | ORAL | Status: DC | PRN
Start: 2024-06-07 — End: 2024-06-09

## 2024-06-07 MED ORDER — SALINE SPRAY 0.65 % NA SOLN
2.0000 | NASAL | Status: DC | PRN
Start: 2024-06-07 — End: 2024-06-09

## 2024-06-07 MED ORDER — PANTOPRAZOLE SODIUM 40 MG PO TBEC
40.0000 mg | DELAYED_RELEASE_TABLET | Freq: Every morning | ORAL | Status: DC
Start: 2024-06-08 — End: 2024-06-09
  Administered 2024-06-08 – 2024-06-09 (×2): 40 mg via ORAL
  Filled 2024-06-07 (×2): qty 1

## 2024-06-07 MED ORDER — GLUCOSE 40 % PO GEL (WRAP)
15.0000 g | ORAL | Status: DC | PRN
Start: 2024-06-07 — End: 2024-06-09

## 2024-06-07 MED ORDER — DEXTROSE 50 % IV SOLN
12.5000 g | INTRAVENOUS | Status: DC | PRN
Start: 2024-06-07 — End: 2024-06-09

## 2024-06-07 MED ORDER — THIAMINE (VITAMIN B1) 100 MG PO TABS (WRAP)
100.0000 mg | ORAL_TABLET | Freq: Every day | ORAL | Status: DC
Start: 2024-06-08 — End: 2024-06-09
  Administered 2024-06-08 – 2024-06-09 (×2): 100 mg via ORAL
  Filled 2024-06-07 (×2): qty 1

## 2024-06-07 MED ORDER — LEVOTHYROXINE SODIUM 25 MCG PO TABS
25.0000 ug | ORAL_TABLET | Freq: Every day | ORAL | Status: DC
Start: 2024-06-08 — End: 2024-06-09
  Administered 2024-06-08 – 2024-06-09 (×2): 25 ug via ORAL
  Filled 2024-06-07 (×2): qty 1

## 2024-06-07 MED ORDER — BENZOCAINE-MENTHOL MT LOZG (WRAP)
1.0000 | LOZENGE | OROMUCOSAL | Status: DC | PRN
Start: 2024-06-07 — End: 2024-06-09

## 2024-06-07 MED ORDER — HEPARIN SODIUM (PORCINE) 5000 UNIT/ML IJ SOLN
5000.0000 [IU] | Freq: Two times a day (BID) | INTRAMUSCULAR | Status: DC
Start: 2024-06-07 — End: 2024-06-09
  Administered 2024-06-07 – 2024-06-09 (×5): 5000 [IU] via SUBCUTANEOUS
  Filled 2024-06-07 (×5): qty 1

## 2024-06-07 MED ORDER — MECLIZINE HCL 12.5 MG PO TABS
25.0000 mg | ORAL_TABLET | Freq: Once | ORAL | Status: AC
Start: 2024-06-07 — End: 2024-06-07
  Administered 2024-06-07: 25 mg via ORAL
  Filled 2024-06-07: qty 2

## 2024-06-07 MED ORDER — NALOXONE HCL 0.4 MG/ML IJ SOLN (WRAP)
0.2000 mg | INTRAMUSCULAR | Status: DC | PRN
Start: 2024-06-07 — End: 2024-06-09

## 2024-06-07 MED ORDER — MELATONIN 3 MG PO TABS
3.0000 mg | ORAL_TABLET | Freq: Every evening | ORAL | Status: DC | PRN
Start: 2024-06-07 — End: 2024-06-09

## 2024-06-07 MED ORDER — ROSUVASTATIN CALCIUM 10 MG PO TABS
40.0000 mg | ORAL_TABLET | Freq: Every day | ORAL | Status: DC
Start: 2024-06-08 — End: 2024-06-07

## 2024-06-07 MED ORDER — GLUCAGON 1 MG IJ SOLR (WRAP)
1.0000 mg | INTRAMUSCULAR | Status: DC | PRN
Start: 2024-06-07 — End: 2024-06-09

## 2024-06-07 MED ORDER — SODIUM CHLORIDE 0.9 % IV BOLUS
500.0000 mL | Freq: Once | INTRAVENOUS | Status: AC
Start: 2024-06-07 — End: 2024-06-07
  Administered 2024-06-07: 500 mL via INTRAVENOUS
  Filled 2024-06-07: qty 500

## 2024-06-07 MED ORDER — DEXTROSE 10 % IV BOLUS
12.5000 g | INTRAVENOUS | Status: DC | PRN
Start: 2024-06-07 — End: 2024-06-09

## 2024-06-07 NOTE — Progress Notes (Signed)
 06/07/24 1114   Vital Signs   Temp 98.2 F (36.8 C)   Temp src Oral   Heart Rate 84   Resp Rate 20   BP 136/63   BP Location Left forearm   BP Method Automatic   MAP (mmHg) 91   Patient Position Lying   Oxygen  Therapy   SpO2 96 %   O2 Device None (Room air)     Admitted to expedited obs #34 from ED. Arrival vitals see above. Alert, oriented to person,place,time and situation. Very weak/dozing off frequently, I am weak. Initiated tele, sinus rhythm. C/o right foot toe pain, left buttock,oral tylenol  with some positive effect. Fall precautions initiated, declined breakfast.   4 eyes in 4 hours pressure injury assessment note:      Completed with: Dagmar SQUIBB., RN  Unit & Time admitted:              Bony Prominences: Check appropriate box; if Pressure Injury is present enter Pressure Injury assessment in LDA    Occiput:                    []  Pressure Injury present  Face:                        []  Pressure Injury present  Ears:                         []  Pressure Injury present  Spine:                       []  Pressure Injury present  Shoulders:                []  Pressure Injury present  Elbows:                     []  Pressure Injury present  Sacrum/coccyx:        []  Pressure Injury present  Ischial Tuberosity:    []  Pressure Injury present  Trochanter/Hip:        []  Pressure Injury present  Knees:                      []  Pressure Injury present  Ankles:                     []  Pressure Injury present  Heels:                       []  Pressure Injury present  Other pressure areas: []  Pressure Injury location       Device related: []  Device name:         LDA completed if pressure injury present: yes/no  Consult WOCN if necessary    Other skin related issues, ie tears, rash, etc, document in Integumentary flowsheet

## 2024-06-07 NOTE — Consults (Signed)
 NEUROLOGY CONSULTATION    Date Time: 06/07/24 10:42 AM  Patient Name: Emily [name]  Attending Physician: Honey Muss, Ardeth, MD      Assessment & Plan:   Dizziness vertigo, and fall in the setting of AKI and prior history of chronic vertigo could be exacerbation of prior deficits, due to AKI dehydration could be orthostasis not entirely certain at this time doubt CVA  Correct AKI  Monitor orthostatics  Do not feel strongly about further neurotesting at this time head CT was done through the ED which is negative  Will follow-up        History of Present Illness:   Neurology consultation requested by:--> Dr. Muss  78 year old lady, with a prior history of chronic vertigo, syncope breast cancer hyperlipidemia hypertension hypothyroidism low back pain comes to the hospital because of new onset of dizziness she says she woke up in the middle of the night to go to the bathroom felt extremely dizzy and weak felt nauseous and because of the dizziness fell down call 911, and was brought to the ED she reports of frank spinning she reports no diplopia or slurred speech per se    Her symptoms are much better, but she feels extremely weak and has no dizziness when she is lying flat  When checked by paramedics blood pressure was 106/61 initial blood pressure was 145/74 pulse ox 98% blood glucose 128 heart rate was 91                     Past Medical History:   Medical History[1]    Meds:      Home medications Norvasc  Arimidex  aspirin  meclizine  Protonix  Crestor     Allergies[2]    Social & Family History:   Social History[3]  Family History[4]        CODE STATUS: Full code  I personally reviewed all of the medications.  Medication list generated using all available resources.  Elder abuse (physical)  - negative  Advanced care plan - reviewed from chart or in discussion with pt or family    Review of Systems:   No headache, eye, ear nose, throat problems; no coughing or wheezing or shortness of breath, No chest pain or  orthopnea, no abdominal pain, nausea or vomiting, No pain in the body or extremities, no psychiatric, neurological, endocrine, hematological or cardiac complaints except as noted above.          Physical Exam:   Blood pressure 116/56, pulse 60, temperature 98 F (36.7 C), temperature source Oral, resp. rate 22, height 1.626 m (5' 4), weight 69.5 kg (153 lb 3.5 oz), SpO2 97%.    HEENT: Normocephalic.no carotid bruits  Lungs:  CTA bil  Abd Soft   Cardiac:  S1,S2, normal rate and rhythm  Neck: supple, no cartoid bruits  Extremities: no edema  Skin: no rashes seen in exposed areas     Neuro:  Level of consciousness:  Alert and appropriate  Oriented:  X 3  Cognition:  Intact naming, recognition, concentration and following complex commands  Cranial Nerves:  II-XII intact pupils are equal reactive no nystagmus  Strength:  No upper extremity drift, 5/5 strength x 4 extremities  Coordination:  Intact FTN testing  Reflexes:  +2 throughout, down going toes bil  Sensation intact light touch temperature bilaterally removing the right leg less as compared to the left due to pain in the right leg     Recent Labs   Lab 06/07/24  0502  Glucose 93   BUN 23*   Creatinine 2.0*   Calcium  9.3   Sodium 147*   Potassium 4.1   Chloride 109   CO2 27   Albumin  4.0   AST (SGOT) 38   ALT 28   Bilirubin, Total 0.3   Alkaline Phosphatase 82     Recent Labs   Lab 06/07/24  0502   WBC 6.72   Hemoglobin 10.2*   Hematocrit 33.6*   MCV 88.4   MCH 26.8   MCHC 30.4*   Platelet Count 262         Recent Labs     06/07/24  0502   PTT 35   PT 10.6   INR 0.9        US  Abdomen Limited RUQ  Result Date: 06/07/2024   1. Cholelithiasis 2. Mild gallbladder wall thickening may represent cholesterolosis or chronic cholecystitis. HIDA scan may be useful for further evaluation depending on symptoms Jeana Sor, MD 06/07/2024 9:59 AM    CT Cervical Spine WO Contrast  Result Date: 06/07/2024   No acute fracture is seen. [name], MD 06/07/2024 6:48 AM    CT Head WO  Contrast  Result Date: 06/07/2024   No acute intracranial abnormality is seen. [name], MD 06/07/2024 6:39 AM    CT Abd/Pelvis without Contrast  Result Date: 06/07/2024  No acute intra-abdominal noncontrast CT findings. Cholelithiasis. Yancy Revering, MD 06/07/2024 6:35 AM    XR Chest  AP Portable  Result Date: 06/07/2024  1. Stable enlarged cardiac silhouette. 2. No acute pulmonary process. Lamarr Bellini, MD 06/07/2024 5:56 AM       All recent brain and spine imaging (MRI, CT) personally reviewed.    Chart reviewed    Case discussed with: Patient, primary care team    TTS ; > Time spent examining patient, in counseling or coordination of care, reviewing test results, and in documentation. 50% time spent in counseling or coordination of care        This note was generated by the Epic EMR system/Speech recognition and may contain inherent errors or omissions not intended by the user. Grammatical errors, random word insertions, deletions and pronoun errors  are occasional consequences of this technology due to software limitations.   Not all errors are caught or corrected. If there are questions or concerns about the content of this note or information contained within the body of this dictation they should be addressed directly with the author for clarification.    Signed by: Reese GORMAN Erps, MD,  Spectralink: [id number]      Answering Service: [phone]           [1]   Past Medical History:  Diagnosis Date    Breast cancer (CMS/HCC) 2014    right breast mastectomy    Exercise-induced angina     negative work up    GERD (gastroesophageal reflux disease)     Headache     resolved    Hyperlipidemia     Hypertension     Hypothyroidism     Low back pain     Malignant neoplasm of overlapping sites of right female breast (CMS/HCC) 10/19/2015    Syncope and collapse     Chronic Vertigo    Vertigo    [2]   Allergies  Allergen Reactions    Morphine  Itching    Morphine  And Codeine  Itching and Nausea And Vomiting     dizzy    [3]   Social History  Socioeconomic  History    Marital status: Widowed   Tobacco Use    Smoking status: Never    Smokeless tobacco: Never   Vaping Use    Vaping status: Never Used   Substance and Sexual Activity    Alcohol use: Yes     Comment: wine    Drug use: No    Sexual activity: Not Currently     Social Drivers of Health     Financial Resource Strain: Low Risk  (02/05/2024)    Overall Financial Resource Strain (CARDIA)     Difficulty of Paying Living Expenses: Not hard at all   Food Insecurity: No Food Insecurity (05/28/2024)    Hunger Vital Sign     Worried About Running Out of Food in the Last Year: Never true     Ran Out of Food in the Last Year: Never true   Transportation Needs: No Transportation Needs (05/28/2024)    PRAPARE - Therapist, art (Medical): No     Lack of Transportation (Non-Medical): No   Physical Activity: Inactive (02/06/2024)    Exercise Vital Sign     Days of Exercise per Week: 0 days     Minutes of Exercise per Session: 0 min   Stress: No Stress Concern Present (02/06/2024)    [employer] of Occupational Health - Occupational Stress Questionnaire     Feeling of Stress : Only a little   Social Connections: Moderately Isolated (02/06/2024)    Social Connection and Isolation Panel     Frequency of Communication with Friends and Family: More than three times a week     Frequency of Social Gatherings with Friends and Family: Three times a week     Attends Religious Services: More than 4 times per year     Active Member of Clubs or Organizations: No     Attends Banker Meetings: Never     Marital Status: Widowed   Intimate Partner Violence: Not At Risk (05/28/2024)    Humiliation, Afraid, Rape, and Kick questionnaire     Fear of Current or Ex-Partner: No     Emotionally Abused: No     Physically Abused: No     Sexually Abused: No   Housing Stability: Not At Risk (05/28/2024)    Housing Stability NCSS     Do you have housing?: Yes     Are you worried about  losing your housing?: No   [4]   Family History  Problem Relation Name Age of Onset    Breast cancer Neg Hx

## 2024-06-07 NOTE — H&P (Signed)
 [facility]  Internal Medicine Hospitalists  History and Physical        Assessment / Plan:        Emily [name] is a 78 y.o. female with past medical history of breast cancer, hypertension, hyperlipidemia, hypothyroidism,  GERD, came in today after a fall with dizziness and generalized weakness    #Generalized weakness  #s/p fall  #dizziness  #AKI  #Hypernatremia  -had AKI last admission as well that improved but did not resolve  -head CT noncontrast showing no acute intracranial abnormality, same with cervical CT  -CT AP showing cholelithiasis with mild gallbladder wall thickening   -US  ordered  -gentle hydration  -antiemetics  -prn meclizine     Addendum: US  RUQ showing mild gallbladder wall thickening, consult gen surg in AM to consider HIDA scan    #HTN  -hold blood pressure for now given weakness and recent fall, and AKI    #Hypothyroidism  -continue with levothyroxine     #HLD  -continue with crestor     #Hx of breast cancer  -continue with arimidex      #Ingrown hair  -outpt follow up with GYN or derm    VTE Prophylaxis: heparin  (porcine) injection 5,000 Units  Place sequential compression device     Foley Catheter: No Foley Present    Venous Access: No Temporary Central Line Present    Medical Readiness for Discharge: Anticipated Tomorrow    Open Handoff Activity in Sidebar    Additional Diagnoses:     Patient has a BMI of 28.65 kg/m2    Overweight: BMI of 25 to 29.9   Recent Labs     06/07/24  0502   Sodium 147*     Diagnosis: Hypernatremia       Recent Labs   Lab 06/07/24  0502   Hemoglobin 10.2*   Hematocrit 33.6*   MCV 88.4   WBC 6.72   Platelet Count 262         Anemia Diagnosis: Unspecified Anemia (currently unable to determine type)  Cr Baseline Estimation (minimum in last 3 months): 1.2 mg/dL  Maximum Cr in last 36 hours: 2 mg/dL    Recent Labs (Last 3 Months)     06/07/24  0502 05/29/24  0520 05/28/24  1610   Creatinine 2.0* 1.3* 1.2*   BUN 23* 18 21       Acute Kidney Injury  Diagnosis: Acute kidney injury, present on admission       HPI:      Emily [name] presented to the hospital after a fall. Patient states she was trying to use the toilet when she felt a weird sensation around her perineum. She landed on the ground of the toilet and hit her head but did not lose consciousness. She said she crawled to the parking lot and met EMS, saying she felt to weak to try to get up by herself.    In the ER, initial vitals signs are stable. Labs are significant for AKI with Creatinine of 2. Less than 6 months ago she had a normal creatinine but 10 days ago it was mildly elevated. Neurology was consulted. During my evaluation, I assessed with a chaperone what the patient felt was on her perineum. It was several ingrown hair bumps. There are no signs of erythema, swelling, rash.         Past Medical / Surgical / Family / Social History:     Past Medical History:   Diagnosis Date  Breast cancer (CMS/HCC) 2014    right breast mastectomy    Exercise-induced angina     negative work up    GERD (gastroesophageal reflux disease)     Headache     resolved    Hyperlipidemia     Hypertension     Hypothyroidism     Low back pain     Malignant neoplasm of overlapping sites of right female breast (CMS/HCC) 10/19/2015    Syncope and collapse     Chronic Vertigo    Vertigo      Past Surgical History[1]   Family History[2]  Social History[3]    Allergies and Home Medications:     Allergies[4]  Home Medications               amLODIPine  (NORVASC ) 10 MG tablet     Take 1 tablet (10 mg) by mouth once daily     anastrozole  (ARIMIDEX ) 1 MG tablet     Take 1 tablet (1 mg total) by mouth every morning     aspirin  81 MG chewable tablet     Chew 1 tablet (81 mg total) by mouth daily     meclizine  (ANTIVERT ) 12.5 MG tablet     Take 1 tablet (12.5 mg) by mouth 3 (three) times daily as needed for Dizziness     Multiple Vitamins-Minerals (MULTIVITAMIN WITH MINERALS) tablet     Take 1 tablet by mouth once every morning      pantoprazole  (PROTONIX ) 40 MG tablet (Expired)     Take 1 tablet (40 mg) by mouth every morning before breakfast     rosuvastatin  (CRESTOR ) 40 MG tablet (Expired)     Take 1 tablet (40 mg) by mouth once daily            Review of Systems:   All other systems were reviewed and negative except as described above.      Objective:      Patient Vitals for the past 24 hrs:   BP Temp Temp src Pulse Resp SpO2 Height Weight   06/07/24 0607 125/60 -- -- 67 -- 98 % -- --   06/07/24 0527 121/59 -- -- 66 -- 97 % -- --   06/07/24 0517 121/58 -- -- 71 -- 96 % -- --   06/07/24 0428 131/60 -- -- 74 16 98 % -- --   06/07/24 0427 123/56 -- -- -- -- -- -- --   06/07/24 0323 129/60 -- -- 78 -- 98 % -- --   06/07/24 0259 137/62 98.3 F (36.8 C) Oral 82 16 98 % 1.626 m (5' 4) 75.7 kg (166 lb 14.2 oz)        Vital Signs and Nursing notes reviewed  General: awake; No acute distress.  Eyes: Normal conjunctiva  ENT: moist mucous membranes, no lesions  Cardiovascular: Normal S1 and S2 RRR  Lungs: no distress, clear and equal breath sounds bilaterally  Abdomen: soft, non-tender, non-distended; +BS  Extremities: no edema or tenderness  Neuro: alert, oriented x 3, no gross abnormality, no focal neurological deficits  Psych: Normal behavior and mood             CBC:  Recent Labs   Lab 06/07/24  0502   WBC 6.72   Hemoglobin 10.2*   Hematocrit 33.6*   Platelet Count 262        BMP:   Recent Labs   Lab 06/07/24  0502   Sodium 147*   Potassium 4.1   Chloride  109   CO2 27   BUN 23*   Creatinine 2.0*   Calcium  9.3   Glucose 93     LFT:   Recent Labs   Lab 06/07/24  0502   Albumin  4.0   Protein, Total 7.0   Bilirubin, Total 0.3   Alkaline Phosphatase 82   ALT 28   AST (SGOT) 38     Coags:   Recent Labs   Lab 06/07/24  0502   PT 10.6   INR 0.9   PTT 35     Images personally reviewed:  CT Cervical Spine WO Contrast  Result Date: 06/07/2024   No acute fracture is seen. Emily Marchi, MD 06/07/2024 6:48 AM    CT Head WO Contrast  Result Date: 06/07/2024   No  acute intracranial abnormality is seen. Emily Marchi, MD 06/07/2024 6:39 AM    CT Abd/Pelvis without Contrast  Result Date: 06/07/2024  No acute intra-abdominal noncontrast CT findings. Cholelithiasis. Yancy Revering, MD 06/07/2024 6:35 AM    XR Chest  AP Portable  Result Date: 06/07/2024  1. Stable enlarged cardiac silhouette. 2. No acute pulmonary process. Lamarr Bellini, MD 06/07/2024 5:56 AM    MRI brain without contrast  Result Date: 05/28/2024   1.No acute infarct, intracranial hemorrhage, or mass effect. 2.Chronic small vessel ischemic changes. Reyes Argyle, MD 05/28/2024 10:55 PM    CTA  Head & Neck  Result Date: 05/28/2024  1.Normal CT perfusion examination of the head. There is no territorial perfusion abnormality. 2.There is no stenosis of the proximal right internal carotid artery based on NASCET criteria. 3.There is no stenosis of the proximal left internal carotid artery based on NASCET criteria. 4.The vertebrobasilar arterial system is patent. 5.Normal intracranial CT angiogram. There is no proximal intracranial large vessel occlusion. Milan Arne Jumbo, MD 05/28/2024 9:04 AM    CT Perfusion Brain  Result Date: 05/28/2024  1.Normal CT perfusion examination of the head. There is no territorial perfusion abnormality. 2.There is no stenosis of the proximal right internal carotid artery based on NASCET criteria. 3.There is no stenosis of the proximal left internal carotid artery based on NASCET criteria. 4.The vertebrobasilar arterial system is patent. 5.Normal intracranial CT angiogram. There is no proximal intracranial large vessel occlusion. Milan Arne Jumbo, MD 05/28/2024 9:04 AM    CT Head WO Contrast  Result Date: 05/28/2024   1.Mild chronic small vessel ischemic disease. Doylene Patee, MD 05/28/2024 8:09 AM        EKG personally reviewed: NSR, no significant change from prior    Current Facility-Administered Medications[5]  Infusion Meds[6]  PRN Medications[7]       Signed by: Ardeth Honey Muss,  MD            [1]   Past Surgical History:  Procedure Laterality Date    COLONOSCOPY, DIAGNOSTIC (SCREENING)  2021    COLONOSCOPY, REMOVAL OF TUMOR, POLYP, OR OTHER LESION BY SNARE TECHNIQUE N/A 07/21/2020    Procedure: COLONOSCOPY, POLYPECTOMY;  Surgeon: Jerri Nicolas, MD;  Location: [facility];  Service: Gastroenterology;  Laterality: N/A;    EGD, BIOPSY N/A 07/21/2020    Procedure: EGD, BIOPSY;  Surgeon: Jerri Nicolas, MD;  Location: [facility];  Service: Gastroenterology;  Laterality: N/A;    EXPLORATORY LAPAROTOMY N/A 05/22/2021    Procedure: EXPLORATORY LAPAROTOMY;  Surgeon: Odean Rollo HERO, MD;  Location: [facility];  Service: General;  Laterality: N/A;    HYSTERECTOMY      LAPAROSCOPY, DIAGNOSTIC N/A 05/22/2021  Procedure: LAPAROSCOPY, DIAGNOSTIC;  Surgeon: Odean Rollo HERO, MD;  Location: [facility];  Service: General;  Laterality: N/A;    LAPAROTOMY, COLECTOMY, RIGHT Right 05/22/2021    Procedure: LAPAROTOMY, COLECTOMY, RIGHT;  Surgeon: Odean Rollo HERO, MD;  Location: [facility];  Service: General;  Laterality: Right;    MASTECTOMY Right 2015   [2]   Family History  Problem Relation Name Age of Onset    Breast cancer Neg Hx     [3]   Social History  Tobacco Use    Smoking status: Never    Smokeless tobacco: Never   Vaping Use    Vaping status: Never Used   Substance Use Topics    Alcohol use: Yes     Comment: wine    Drug use: No   [4]   Allergies  Allergen Reactions    Morphine  Itching    Morphine  And Codeine  Itching and Nausea And Vomiting     dizzy   [5] [6] [7]

## 2024-06-07 NOTE — Plan of Care (Signed)
 Problem: Pain interferes with ability to perform ADL  Goal: Pain at adequate level as identified by patient  Outcome: Progressing  Flowsheets (Taken 06/07/2024 2145)  Pain at adequate level as identified by patient:   Identify patient comfort function goal   Assess for risk of opioid induced respiratory depression, including snoring/sleep apnea. Alert healthcare team of risk factors identified.   Assess pain on admission, during daily assessment and/or before any as needed intervention(s)   Reassess pain within 30-60 minutes of any procedure/intervention, per Pain Assessment, Intervention, Reassessment (AIR) Cycle   Evaluate if patient comfort function goal is met   Evaluate patient's satisfaction with pain management progress     Problem: Moderate/High Fall Risk Score >5  Goal: Patient will remain free of falls  Outcome: Progressing  Flowsheets (Taken 06/07/2024 2145)  High (Greater than 13):   MOD-Remain with patient during toileting   MOD-Perform dangle, stand, walk (DSW) prior to mobilization   MOD-Request PT/OT consult order for patients with gait/mobility impairment     Problem: Hemodynamic Status: Cardiac  Goal: Stable vital signs and fluid balance  Outcome: Progressing  Flowsheets (Taken 06/07/2024 2145)  Stable vital signs and fluid balance:   Assess signs and symptoms associated with cardiac rhythm changes   Monitor lab values     Problem: Inadequate Tissue Perfusion  Goal: Adequate tissue perfusion will be maintained  Outcome: Progressing  Flowsheets (Taken 06/07/2024 2145)  Adequate tissue perfusion will be maintained:   Monitor/assess lab values and report abnormal values   Monitor/assess for signs of VTE (edema of calf/thigh redness, pain)   Monitor/assess neurovascular status (pulses, capillary refill, pain, paresthesia, paralysis, presence of edema)   Monitor for signs and symptoms of a pulmonary embolism (dyspnea, tachypnea, tachycardia, confusion)   Encourage/assist patient as needed to turn, cough,  and perform deep breathing every 2 hours

## 2024-06-07 NOTE — ED Provider Notes (Signed)
 [facility]  Emergency Department Physician Attestation Note      Diagnosis/Disposition     ED Disposition:  Expedited Observation    ED Diagnosis:     Vertigo  AKI (acute kidney injury)  Gallstones    Discharge Prescription    No medications on file       Clinical Summary        Nursing Triage Note:    Chief Complaint: Fall (Pt BIB EMS with c/o fall. Patient stated that she woke up and fell when getting out the bed, patient stated that she felt dizzy when she fell, denies LOC, bld thinners. Endorses hitting the back of her head and crawling downstairs for EMS to arrive. ) and Foreign Body in Vagina (Patient stated that she feels like something is crawling out her vagina and notices a rash around her vagina)    Midlevel Provider/Resident Attestation: The patient was seen and examined by the mid-level (resident, physician assistant or nurse practitioner) and the plan of care was discussed with me. I am the primary attending physician of record for this patient. I personally made/approved the management plan and take responsibility for the patient management.         PRIMARY PROBLEM LIST      Medical Decision Making  See APP note for complete details    Dizzy with standing at home with fall, no CP or abd pain at rest but + with palpation  No focal deficits    Patient signed out to me pending CT results for observation given new AKI with dizziness and fall.  CT with no significant evidence of infection or blockage at this time.  Did demonstrate cholelithiasis and due to this as well as tenderness on palpation but no tenderness on rest and otherwise normal labs, ultrasound was ordered to further evaluate gallbladder.  Hospitalist was contacted and case discussed and hospitalist accepted observation.  Patient placed in observation    Amount and/or Complexity of Data Reviewed  Labs: ordered.  Radiology: ordered.  ECG/medicine tests: ordered.    Risk  Decision regarding hospitalization.          DISCUSSION         The patient was admitted and handed off to Dr Honey Far. We discussed all aspects of the work-up that had been completed in the ED, any pending studies/therapies, and all clinical decision making at the time care was handed off.     Additional Notes                                   ED Course as of 06/07/24 2213   Fri Jun 07, 2024   0534 SO: dizzy, new AKI. IVF order. CT pending [AD]   0657 CT abd/pelvis evaluated by myself and radiology demonstrated  Right lower lobe pleural-parenchymal scarring and granulomatous  calcifications. Atherosclerotic calcifications of the coronary arteries and  aorta.     Fluid attenuated right renal cysts. Depending cholelithiasis. Solid viscera  otherwise normal. Postsurgical changes of partial right colonic resection  with reanastomosis. No bowel obstruction. Uterus is surgically absent. No  free fluid or inflammatory fat stranding. Retroperitoneal and pelvic  phleboliths. No urinary tract stone. No lymphadenopathy. Postsurgical  changes of right mastectomy. Posterior gluteal soft tissue granulomatous  calcifications. Degenerative changes of the skeleton and osteopenia.     IMPRESSION:      No acute intra-abdominal noncontrast CT findings.  Cholelithiasis.   [AD]   9342 CT head evaluated by myself and radiology demonstrated  IMPRESSION:      No acute intracranial abnormality is seen.   [AD]   9342 CT cervical evaluated by myself and radiology demonstrated  IMPRESSION:      No acute fracture is seen.      [AD]   9292 Dr Honey Muss accepts exp obs   [AD]      ED Course User Index  [AD] Dreon Pineda, Jon PARAS, MD      NIH Stroke Score      Flowsheet Row Most Recent Value   NIH Stroke Scale    Interval Shift Assessment   1a. Level of Consciousness 0   1b. LOC Questions (age, month) 0   1c. LOC Commands (Open and close eyes, Grip AND release good hand) 0   2. Best Gaze 0   3. Visual Fields 0   4. Facial Palsy 0   5a. Motor Left Arm: (Arms with palm down X 10 seconds. Sitting = arms  at 90 degrees. Supine = arms 45 degrees) 0   5b. Motor Right Arm: (Arms with palm down X 10 seconds. Sitting = arms at 90 degrees Supine = arms 45 degrees) 0   Total Motor Arms 0   6a. Motor Left Leg: (Leg elevated X 5 seconds Supine = Leg 30 degrees) 0   6b. Motor Right Leg: (Leg elevated X 5 seconds Supine = Leg 30 degrees) 0   Total Motor Legs 0   7. Limb Ataxia (Finger to nose, heel to shin) 0   8. Sensory (Sensation or grimace to pin prick on face, arm, trunk, and leg) 0   9. Best language (Describe picture, name items, read sentences from NIHSS booklet) 0   10. Dysarthria (Read list from NIHSS booklet) 0   11. Extinction and Inattention (formerly Neglect) - Test tactile and visual stimulation 0   NIHSS Total 0        Vital Signs: Reviewed the patient's vital signs.   Nursing Notes: Reviewed and utilized available nursing notes.   Medical Records Reviewed: Reviewed available past medical records.   Counseling: The emergency provider has spoken with the patient and discussed today's findings, in addition to providing specific details for the plan of care. Questions are answered and there is agreement with the plan.     CRITICAL CARE/PROCEDURES    Procedures         CARDIAC STUDIES      The following cardiac studies were independently interpreted by me the Emergency Medicine Provider. For full cardiac study results please see chart.                                                                   EMERGENCY IMAGING STUDIES      The following imaging studies were independently interpreted by me (emergency medicine provider):                                              Supplemental Encounter Data   Medical History[1]  Past Surgical History[2]  Social History[3]  Family History[4]  Allergies[5]    Encounter Orders:  Orders Placed This Encounter   Procedures    COVID-19 and Influenza ([place]) (symptomatic)    Vaginal Bacterial vaginosis, Candida spp., and Trichomonas vaginalis, PCR    CT Head WO Contrast    CT  Cervical Spine WO Contrast    CT Abd/Pelvis without Contrast    XR Chest  AP Portable    US  Abdomen Limited RUQ    Urinalysis with Reflex to Microscopic Exam and Culture    Urine Elnor Culture Hold Tube    CBC with Differential (Order)    Comprehensive Metabolic Panel    PT/APTT    CBC with Differential (Component)    Basic Metabolic Panel    Adult diet Regular    Glucose POC    NIH Stroke Scale    Orthostatic Vital Signs    Vital signs    Pulse Oximetry    Progressive Mobility Protocol    Notify physician    I/O    Height    Weight    Skin assessment    Notify physician (Critical Blood Glucose Value)    Notify physician (Communication: Document Abnormal Blood Glucose)    POCT order (PRN hypoglycemia)    Adult Hypoglycemia Treatment Algorithm    Place sequential compression device    Maintain sequential compression device    Education: Activity    Education: Disease Process & Condition    Education: Pain Management    Education: Falls Risk    Education: Smoking Cessation    Pain Assessment    Incentive spirometry nursing    Orthostatic blood pressure    Full Code    ECG 12 lead    Saline lock IV    Admit to Expedited Observation    Fall precautions     Medications Administered:  Medications   dextrose  (GLUCOSE) 40 % oral gel 15 g of glucose (has no administration in time range)     Or   dextrose  (D10W) 10% bolus 125 mL (has no administration in time range)     Or   dextrose  50 % bolus 12.5 g (has no administration in time range)     Or   glucagon  (rDNA) (GLUCAGEN) injection 1 mg (has no administration in time range)   acetaminophen  (TYLENOL ) tablet 500 mg (500 mg Oral Given 06/07/24 1120)   melatonin tablet 3 mg (has no administration in time range)   saline (OCEAN NASAL SPRAY) 0.65 % nasal solution 2 spray (has no administration in time range)   carboxymethylcellulose (PF) (REFRESH PLUS) 0.5 % ophthalmic solution 1 drop (has no administration in time range)   benzocaine -menthol  (CEPACOL/CHLORASEPTIC) lozenge 1 lozenge  (has no administration in time range)   benzonatate  (TESSALON ) capsule 100 mg (has no administration in time range)   naloxone  (NARCAN ) injection 0.2 mg (has no administration in time range)   heparin  (porcine) injection 5,000 Units (5,000 Units Subcutaneous Given 06/07/24 2124)   anastrozole  (ARIMIDEX ) tablet 1 mg (has no administration in time range)   levothyroxine  (SYNTHROID ) tablet 25 mcg (has no administration in time range)   pantoprazole  (PROTONIX ) EC tablet 40 mg (has no administration in time range)   thiamine  (VITAMIN B1) tablet 100 mg (has no administration in time range)   rosuvastatin  (CRESTOR ) tablet 10 mg (has no administration in time range)   sodium chloride  0.9 % bolus 500 mL (0 mLs Intravenous Stopped 06/07/24 0623)   meclizine  (ANTIVERT ) tablet 25 mg (25 mg Oral Given 06/07/24 0418)  Laboratory and Imaging Studies:  Results for orders placed or performed during the hospital encounter of 06/07/24 (from the past 24 hours)   Urinalysis with Reflex to Microscopic Exam and Culture    Collection Time: 06/07/24  3:38 AM    Specimen: Urine, Clean Catch   Result Value    Urine Color Colorless    Urine Clarity Clear    Urine Specific Gravity 1.004    Urine pH 6.0    Urine Leukocyte Esterase Small (A)    Urine Nitrite Negative    Urine Protein Negative    Urine Glucose Negative    Urine Ketones Negative    Urine Urobilinogen Normal    Urine Bilirubin Negative    Urine Blood Negative    RBC, UA 0-2    Urine WBC 0-5    Urine Squamous Epithelial Cells 0-5   Urine Gray Culture Hold Tube    Collection Time: 06/07/24  3:38 AM   Result Value    Extra Tube Hold for add-ons.   COVID-19 and Influenza ([place]) (symptomatic)    Collection Time: 06/07/24  3:52 AM    Specimen: Anterior Nares; Swab   Result Value    SARS-CoV-2 (COVID-19) RNA Not Detected    Influenza A RNA Not Detected    Influenza B RNA Not Detected   Vaginal Bacterial vaginosis, Candida spp., and Trichomonas vaginalis, PCR    Collection Time: 06/07/24  3:52  AM    Specimen: Vagina ; Swab   Result Value    Bacterial vaginosis marker DNA Not Detected    Candida group DNA Detected (A)    Candida glabrata DNA Not Detected    Candida krusei DNA Not Detected    Trichomonas vaginalis DNA Not Detected   Comprehensive Metabolic Panel    Collection Time: 06/07/24  5:02 AM   Result Value    Glucose 93    BUN 23 (H)    Creatinine 2.0 (H)    Sodium 147 (H)    Potassium 4.1    Chloride 109    CO2 27    Calcium  9.3    Anion Gap 11.0    GFR 24.6 (L)    AST (SGOT) 38    ALT 28    Alkaline Phosphatase 82    Albumin  4.0    Protein, Total 7.0    Globulin 3.0    Albumin /Globulin Ratio 1.3    Bilirubin, Total 0.3   PT/APTT    Collection Time: 06/07/24  5:02 AM   Result Value    PT 10.6    INR 0.9    PTT 35   CBC with Differential (Component)    Collection Time: 06/07/24  5:02 AM   Result Value    WBC 6.72    Hemoglobin 10.2 (L)    Hematocrit 33.6 (L)    Platelet Count 262    MPV 10.7    RBC 3.80 (L)    MCV 88.4    MCH 26.8    MCHC 30.4 (L)    RDW 14    nRBC % 0.0    Absolute nRBC 0.00    Preliminary Absolute Neutrophil Count 3.80    Neutrophils % 56.7    Lymphocytes % 32.7    Monocytes % 7.4    Eosinophils % 2.5    Basophils % 0.6    Immature Granulocytes % 0.1    Absolute Neutrophils 3.80    Absolute Lymphocytes 2.20    Absolute Monocytes 0.50  Absolute Eosinophils 0.17    Absolute Basophils 0.04    Absolute Immature Granulocytes 0.01     US  Abdomen Limited RUQ   Final Result          1. Cholelithiasis   2. Mild gallbladder wall thickening may represent cholesterolosis or   chronic cholecystitis. HIDA scan may be useful for further evaluation   depending on symptoms      Jeana Sor, MD   06/07/2024 9:59 AM      CT Abd/Pelvis without Contrast   Final Result      No acute intra-abdominal noncontrast CT findings.      Cholelithiasis.      Yancy Revering, MD   06/07/2024 6:35 AM      CT Cervical Spine WO Contrast   Final Result       No acute fracture is seen.      [name], MD   06/07/2024  6:48 AM      CT Head WO Contrast   Final Result       No acute intracranial abnormality is seen.      [name], MD   06/07/2024 6:39 AM      XR Chest  AP Portable   Final Result         1. Stable enlarged cardiac silhouette.   2. No acute pulmonary process.      Lamarr Bellini, MD   06/07/2024 5:56 AM                 [1]   Past Medical History:  Diagnosis Date    Breast cancer (CMS/HCC) 2014    right breast mastectomy    Exercise-induced angina     negative work up    GERD (gastroesophageal reflux disease)     Headache     resolved    Hyperlipidemia     Hypertension     Hypothyroidism     Low back pain     Malignant neoplasm of overlapping sites of right female breast (CMS/HCC) 10/19/2015    Syncope and collapse     Chronic Vertigo    Vertigo    [2]   Past Surgical History:  Procedure Laterality Date    COLONOSCOPY, DIAGNOSTIC (SCREENING)  2021    COLONOSCOPY, REMOVAL OF TUMOR, POLYP, OR OTHER LESION BY SNARE TECHNIQUE N/A 07/21/2020    Procedure: COLONOSCOPY, POLYPECTOMY;  Surgeon: Jerri Nicolas, MD;  Location: [facility];  Service: Gastroenterology;  Laterality: N/A;    EGD, BIOPSY N/A 07/21/2020    Procedure: EGD, BIOPSY;  Surgeon: Jerri Nicolas, MD;  Location: [facility];  Service: Gastroenterology;  Laterality: N/A;    EXPLORATORY LAPAROTOMY N/A 05/22/2021    Procedure: EXPLORATORY LAPAROTOMY;  Surgeon: Odean Rollo HERO, MD;  Location: [facility];  Service: General;  Laterality: N/A;    HYSTERECTOMY      LAPAROSCOPY, DIAGNOSTIC N/A 05/22/2021    Procedure: LAPAROSCOPY, DIAGNOSTIC;  Surgeon: Odean Rollo HERO, MD;  Location: [facility];  Service: General;  Laterality: N/A;    LAPAROTOMY, COLECTOMY, RIGHT Right 05/22/2021    Procedure: LAPAROTOMY, COLECTOMY, RIGHT;  Surgeon: Odean Rollo HERO, MD;  Location: [facility];  Service: General;  Laterality: Right;    MASTECTOMY Right 2015   [3]   Social History  Tobacco Use    Smoking status: Never    Smokeless tobacco: Never   Vaping Use     Vaping status: Never Used   Substance Use Topics  Alcohol use: Yes     Comment: wine    Drug use: No   [4]   Family History  Problem Relation Name Age of Onset    Breast cancer Neg Hx     [5]   Allergies  Allergen Reactions    Morphine  Itching    Morphine  And Codeine  Itching and Nausea And Vomiting     dizzy        Eryanna Regal, Jon PARAS, MD  06/07/24 2213

## 2024-06-07 NOTE — Pharmacy Admission Med History Advanced (Signed)
 Admission Medication History    Prior to Admission Medication List:  Home Medications       Med List Status: Pharmacy Completed Set By: Ismaili, Zohra at 06/07/2024 12:30 PM              amLODIPine  (NORVASC ) 10 MG tablet     Take 1 tablet (10 mg) by mouth once daily     anastrozole  (ARIMIDEX ) 1 MG tablet     Take 1 tablet (1 mg total) by mouth every morning     aspirin  81 MG chewable tablet     Chew 1 tablet (81 mg total) by mouth daily     celecoxib  (CeleBREX ) 200 MG capsule     Take 1 capsule (200 mg) by mouth once daily     ibandronate (BONIVA) 150 MG tablet     Take 1 tablet (150 mg) by mouth every 30 (thirty) days     levothyroxine  (SYNTHROID ) 25 MCG tablet     Take 1 tablet (25 mcg) by mouth Once a day at 6:00am     lisinopril  (ZESTRIL ) 40 MG tablet     Take 1 tablet (40 mg) by mouth once daily     meclizine  (ANTIVERT ) 12.5 MG tablet     Take 1 tablet (12.5 mg) by mouth 3 (three) times daily as needed for Dizziness     Multiple Vitamins-Minerals (MULTIVITAMIN WITH MINERALS) tablet     Take 1 tablet by mouth once every morning     pantoprazole  (PROTONIX ) 40 MG tablet     Take 1 tablet (40 mg) by mouth every morning before breakfast     rosuvastatin  (CRESTOR ) 40 MG tablet     Take 1 tablet (40 mg) by mouth once daily     thiamine  (B-1) 100 MG tablet     Take 1 tablet (100 mg) by mouth once daily            Summary:    Medication reconciliation by provider has occurred already.     The following medication changes were documented in the prior to admission medication list:  The following medications were changed (reason if applicable):  None    The following medications were removed/marked as not taking (reason if applicable):  None    The following medications were added:  celecoxib  (CeleBREX ) 200 MG capsule   ibandronate (BONIVA) 150 MG tablet   levothyroxine  (SYNTHROID ) 25 MCG tablet   lisinopril  (ZESTRIL ) 40 MG tablet   thiamine  (B-1) 100 MG tablet     Additional Comments:   Spoke to patient to complete  medication reconciliation.   Patient appears to be a reliable source of meds for the most part, but didn't seem too sure about when she took some meds last, like the ibandronate (she guessed last month around the 20th).     Patient demonstrates Good compliance with medications.  Patient demonstrates Good knowledge of medications.       Primary Source of Medication History: Self and Epic Medication Dispense Record    Patient's Pharmacy Information:  Primary Pharmacy Updated in Epic: No    Preferred pharmacy:   [facility] [address] [place] - [address]  [address]  [place] [address]  Phone: [phone] Fax: [phone]      Note prepared by: Zohra Ismaili    This Medication history was completed by a pharmacy technician and reviewed by a pharmacist.  Medication histories completed by pharmacy have been conducted to the best of our abilities  utilizing multiple sources of information but errors and omissions may exist. For questions or clarifications about this medication history please reach out to pharmacy.

## 2024-06-07 NOTE — Plan of Care (Signed)
 Problem: Pain interferes with ability to perform ADL  Goal: Pain at adequate level as identified by patient  Outcome: Progressing     Problem: Side Effects from Pain Analgesia  Goal: Patient will experience minimal side effects of analgesic therapy  Outcome: Progressing  Flowsheets (Taken 06/07/2024 1230)  Patient will experience minimal side effects of analgesic therapy:   Assess for changes in cognitive function   Monitor/assess patient's respiratory status (RR depth, effort, breath sounds)   Prevent/manage side effects per LIP orders (i.e. nausea, vomiting, pruritus, constipation, urinary retention, etc.)   Evaluate for opioid-induced sedation with appropriate assessment tool (i.e. POSS)     Problem: Moderate/High Fall Risk Score >5  Goal: Patient will remain free of falls  Outcome: Progressing  Flowsheets (Taken 06/07/2024 1230)  Moderate Risk (6-13): MOD-Consider activation of bed alarm if appropriate     Problem: Hemodynamic Status: Cardiac  Goal: Stable vital signs and fluid balance  Outcome: Progressing  Flowsheets (Taken 06/07/2024 1230)  Stable vital signs and fluid balance:   Assess signs and symptoms associated with cardiac rhythm changes   Monitor lab values     Problem: Inadequate Tissue Perfusion  Goal: Adequate tissue perfusion will be maintained  Outcome: Progressing  Flowsheets (Taken 06/07/2024 1230)  Adequate tissue perfusion will be maintained:   Monitor/assess neurovascular status (pulses, capillary refill, pain, paresthesia, paralysis, presence of edema)   Monitor/assess for signs of VTE (edema of calf/thigh redness, pain)   Monitor/assess lab values and report abnormal values   Encourage/assist patient as needed to turn, cough, and perform deep breathing every 2 hours   Monitor for signs and symptoms of a pulmonary embolism (dyspnea, tachypnea, tachycardia, confusion)

## 2024-06-07 NOTE — ED to IP RN Note (Addendum)
 [facility]  ED NURSING NOTE FOR THE RECEIVING INPATIENT NURSE   ED [place] RN   [facility] [phone]   ED CHARGE RN [place] RN   ADMISSION INFORMATION   Emily [name] is a 78 y.o. female admitted with an ED diagnosis of:    1. Vertigo    2. AKI (acute kidney injury)         Isolation: None   Allergies: Morphine  and Morphine  and codeine    Holding Orders confirmed? No   Belongings Documented? Yes   Home medications sent to pharmacy confirmed? N/A   NURSING CARE   Patient Comes From:   Mental Status: Home Independent  alert and oriented   ADL: Independent with all ADLs   Ambulation: 1 person assist, pt able to transfer from bed to commode with assistance as pt is dizzy, states baseline walks at home without any assistance   Pertinent Information  and Safety Concerns:     [place] Violence Risk Level: Low Pt c/o fall, feeling like something crawling out of vagina, abdominal pain. CT abdo indicates cholelithiasis. Pt aox4.       CT / NIH   CT Head ordered on this patient?  Yes   NIH/Dysphagia assessment done prior to admission? Yes   VITAL SIGNS (at the time of this note)      Vitals:    06/07/24 0800   BP: 130/64   Pulse: 78   Resp: 20   Temp: 98.3 F (36.8 C)   SpO2: 97%     Pain Score: 8-severe pain (pt declining pain meds at this time) (06/07/24 0800)

## 2024-06-07 NOTE — ED Provider Notes (Signed)
 [facility]  Emergency Department APP Note      Diagnosis/Disposition     ED Disposition:  Expedited Observation    ED Diagnosis:     Vertigo  AKI (acute kidney injury)  Gallstones    Discharge Prescription    No medications on file       History of Present Illness      Chief Complaint: Fall (Pt BIB EMS with c/o fall. Patient stated that she woke up and fell when getting out the bed, patient stated that she felt dizzy when she fell, denies LOC, bld thinners. Endorses hitting the back of her head and crawling downstairs for EMS to arrive. ) and Foreign Body in Vagina (Patient stated that she feels like something is crawling out her vagina and notices a rash around her vagina)     78 y.o. female with past medical history as below   History of Present Illness  Emily [name] is a 78 year old female who presents with dizziness and a fall.    She experiences dizziness upon waking, feeling as though everything is spinning when sitting up. After attempting to stand, she fell and was disoriented, crawling to the bathroom due to severe dizziness. She continues to feel dizzy and weak, with lightheadedness upon standing, preferring to crawl to avoid further falls.    She reports chest pain localized to the right side, beginning after the fall, with no recollection of trauma to the area. She confirms neck pain on the right side and hitting her head during the fall. No shortness of breath or abdominal pain.    Independent Historian (other than patient): No  Additional History Provided by Independent Historian:      Physical Exam     ED Triage Vitals [06/07/24 0259]   Encounter Vitals Group      BP 137/62      Girls Systolic BP Percentile       Girls Diastolic BP Percentile       Boys Systolic BP Percentile       Boys Diastolic BP Percentile       Heart Rate 82      Resp Rate 16      Temp 98.3 F (36.8 C)      Temp src Oral      SpO2 98 %      Weight 75.7 kg      Height 1.626 m      Head Circumference        Peak Flow       Pain Score 4      Pain Loc       Pain Education       Exclude from Growth Chart       Physical Exam  Vitals and nursing note reviewed. Exam conducted with a chaperone present.   Constitutional:       Appearance: Normal appearance. She is obese.      Interventions: She is not intubated.  HENT:      Head: Normocephalic.      Nose: Nose normal.      Mouth/Throat:      Lips: Pink.      Mouth: Mucous membranes are moist.   Cardiovascular:      Rate and Rhythm: Normal rate.   Pulmonary:      Effort: Pulmonary effort is normal. No tachypnea, bradypnea, accessory muscle usage, prolonged expiration, respiratory distress or retractions. She is not intubated.      Breath  sounds: Normal air entry. No decreased breath sounds, wheezing, rhonchi or rales.   Chest:      Chest wall: Tenderness present. No mass, lacerations, deformity, crepitus or edema.       Abdominal:      General: Abdomen is flat.      Palpations: Abdomen is soft.      Tenderness: There is generalized abdominal tenderness. There is no right CVA tenderness or left CVA tenderness. Negative signs include McBurney's sign.      Hernia: No hernia is present.        Comments: Surgical scar    Genitourinary:     Exam position: Supine.      Pubic Area: No rash or pubic lice.       Labia:         Right: No rash, lesion or injury.         Left: No rash, lesion or injury.       Urethra: No prolapse, urethral pain, urethral swelling or urethral lesion.        Comments: Open comedos   Musculoskeletal:      Cervical back: Normal. No swelling, edema, deformity, erythema, rigidity, spasms, torticollis or bony tenderness. No pain with movement. Normal range of motion.      Thoracic back: Normal. No spasms, tenderness or bony tenderness. Normal range of motion. No scoliosis.      Lumbar back: Normal. No deformity, spasms, tenderness or bony tenderness. Normal range of motion. Negative right straight leg raise test and negative left straight leg raise test.    Skin:     General: Skin is warm and dry.      Capillary Refill: Capillary refill takes less than 2 seconds.   Neurological:      Mental Status: She is alert and oriented to person, place, and time.      GCS: GCS eye subscore is 4. GCS verbal subscore is 5. GCS motor subscore is 6.      Cranial Nerves: Cranial nerves 2-12 are intact.        Physical Exam           Medical Decision Making        PRIMARY PROBLEM LIST      1. Acute illness/injury DIAGNOSIS:Vertigo and Fall    Chronic Illness Impacting Care of the above problem: Renal Disease, Obesity, Advanced age, and Other (explain)Vertigo Increases complexity of evaluation   Differential Diagnosis: Dizziness:  central vertigo, peripheral vertigo, dehydration, anemia, electrolyte abnormality, endocrine abnormality     DISCUSSION      Assessment & Plan  Dizziness and lightheadedness with associated weakness and fall  Severe dizziness and weakness upon standing, leading to fall.  -History of vertigo with numerous workups all negative for intracranial pathology   - NIH of 0    Head injury, chest pain, and neck pain after fall  Chest pain localized and pressure-sensitive. Neck pain on one side. Head impact during fall.  -CT due to age risk factors    Rash with ingrown hairs  Rash around vaginal area appears to be ingrown hairs. No infection signs.  - No signs of cellulitis  - Vaginal swab for BV and Candida     Pain on palpation of abdomen, generalized, but no pain at rest  -Labs remarkable for AKI  -Ordered abdomen and pelvis CT    Singed out to Dr. Germain pending imaging     The patient was handed off to the next ED provider at  the completion of my shift. We discussed all aspects of the work-up that had been completed, any pending studies/therapies, and all clinical decision making at the time care was handed off.     Additional Notes      External Records Reviewed?: Inpatient Records                             ED Course as of 06/08/24 0511   [facility] Jun 07, 2024   0534  SO: dizzy, new AKI. IVF order. CT pending [AD]   0657 CT abd/pelvis evaluated by myself and radiology demonstrated  Right lower lobe pleural-parenchymal scarring and granulomatous  calcifications. Atherosclerotic calcifications of the coronary arteries and  aorta.     Fluid attenuated right renal cysts. Depending cholelithiasis. Solid viscera  otherwise normal. Postsurgical changes of partial right colonic resection  with reanastomosis. No bowel obstruction. Uterus is surgically absent. No  free fluid or inflammatory fat stranding. Retroperitoneal and pelvic  phleboliths. No urinary tract stone. No lymphadenopathy. Postsurgical  changes of right mastectomy. Posterior gluteal soft tissue granulomatous  calcifications. Degenerative changes of the skeleton and osteopenia.     IMPRESSION:      No acute intra-abdominal noncontrast CT findings.     Cholelithiasis.   [AD]   9342 CT head evaluated by myself and radiology demonstrated  IMPRESSION:      No acute intracranial abnormality is seen.   [AD]   9342 CT cervical evaluated by myself and radiology demonstrated  IMPRESSION:      No acute fracture is seen.      [AD]   9292 Dr Honey Muss accepts exp obs   [AD]      ED Course User Index  [AD] Degraaf, Jon PARAS, MD      NIH Stroke Score      Flowsheet Row Most Recent Value   NIH Stroke Scale    Interval Shift Assessment   1a. Level of Consciousness 0   1b. LOC Questions (age, month) 0   1c. LOC Commands (Open and close eyes, Grip AND release good hand) 0   2. Best Gaze 0   3. Visual Fields 0   4. Facial Palsy 0   5a. Motor Left Arm: (Arms with palm down X 10 seconds. Sitting = arms at 90 degrees. Supine = arms 45 degrees) 0   5b. Motor Right Arm: (Arms with palm down X 10 seconds. Sitting = arms at 90 degrees Supine = arms 45 degrees) 0   Total Motor Arms 0   6a. Motor Left Leg: (Leg elevated X 5 seconds Supine = Leg 30 degrees) 0   6b. Motor Right Leg: (Leg elevated X 5 seconds Supine = Leg 30 degrees) 0   Total Motor Legs  0   7. Limb Ataxia (Finger to nose, heel to shin) 0   8. Sensory (Sensation or grimace to pin prick on face, arm, trunk, and leg) 0   9. Best language (Describe picture, name items, read sentences from NIHSS booklet) 0   10. Dysarthria (Read list from NIHSS booklet) 0   11. Extinction and Inattention (formerly Neglect) - Test tactile and visual stimulation 0   NIHSS Total 0        Vital Signs: Reviewed the patient's vital signs.   Nursing Notes: Reviewed and utilized available nursing notes.   Medical Records Reviewed: Reviewed available past medical records.   Counseling: The emergency provider has spoken with  the patient and discussed today's findings, in addition to providing specific details for the plan of care. Questions are answered and there is agreement with the plan.     CRITICAL CARE/PROCEDURES    Procedures         CARDIAC STUDIES      The following cardiac studies were independently interpreted by me the Emergency Medicine Provider. For full cardiac study results please see chart.              EKG 1 interpreted by me (ED provider)  Comparison: None available   Time Interpreted: 0347  Rate: 60-100   Rhythm: Normal Sinus Rhythm   ST segments: No acute changes   STEMI?: No   EKG interpretation: Normal                                 EMERGENCY IMAGING STUDIES      The following imaging studies were independently interpreted by me (emergency medicine provider):                                       Supplemental Encounter Data   Medical History[1]  Past Surgical History[2]  Social History[3]  Family History[4]  Allergies[5]    Encounter Orders:  Orders Placed This Encounter   Procedures   . COVID-19 and Influenza ([place]) (symptomatic)   . Vaginal Bacterial vaginosis, Candida spp., and Trichomonas vaginalis, PCR   . CT Head WO Contrast   . CT Cervical Spine WO Contrast   . CT Abd/Pelvis without Contrast   . XR Chest  AP Portable   . US  Abdomen Limited RUQ   . Urinalysis with Reflex to Microscopic Exam and  Culture   . Urine Emcor   . CBC with Differential (Order)   . Comprehensive Metabolic Panel   . PT/APTT   . CBC with Differential (Component)   . Basic Metabolic Panel   . Adult diet Regular   . Glucose POC   . NIH Stroke Scale   . Orthostatic Vital Signs   . Vital signs   . Pulse Oximetry   . Progressive Mobility Protocol   . Notify physician   . I/O   . Height   . Weight   . Skin assessment   . Notify physician (Critical Blood Glucose Value)   . Notify physician (Communication: Document Abnormal Blood Glucose)   . POCT order (PRN hypoglycemia)   . Adult Hypoglycemia Treatment Algorithm   . Place sequential compression device   . Maintain sequential compression device   . Education: Activity   . Education: Disease Process & Condition   . Education: Pain Management   . Education: Falls Risk   . Education: Smoking Cessation   . Pain Assessment   . Incentive spirometry nursing   . Orthostatic blood pressure   . Full Code   . ECG 12 lead   . Saline lock IV   . Admit to Expedited Observation   . Fall precautions     Medications Administered:  Medications   dextrose  (GLUCOSE) 40 % oral gel 15 g of glucose (has no administration in time range)     Or   dextrose  (D10W) 10% bolus 125 mL (has no administration in time range)     Or   dextrose  50 % bolus 12.5 g (has  no administration in time range)     Or   glucagon  (rDNA) (GLUCAGEN) injection 1 mg (has no administration in time range)   acetaminophen  (TYLENOL ) tablet 500 mg (500 mg Oral Given 06/08/24 0010)   melatonin tablet 3 mg (has no administration in time range)   saline (OCEAN NASAL SPRAY) 0.65 % nasal solution 2 spray (has no administration in time range)   carboxymethylcellulose (PF) (REFRESH PLUS) 0.5 % ophthalmic solution 1 drop (has no administration in time range)   benzocaine -menthol  (CEPACOL/CHLORASEPTIC) lozenge 1 lozenge (has no administration in time range)   benzonatate  (TESSALON ) capsule 100 mg (has no administration in time range)    naloxone  (NARCAN ) injection 0.2 mg (has no administration in time range)   heparin  (porcine) injection 5,000 Units (5,000 Units Subcutaneous Given 06/07/24 2124)   anastrozole  (ARIMIDEX ) tablet 1 mg (has no administration in time range)   levothyroxine  (SYNTHROID ) tablet 25 mcg (has no administration in time range)   pantoprazole  (PROTONIX ) EC tablet 40 mg (has no administration in time range)   thiamine  (VITAMIN B1) tablet 100 mg (has no administration in time range)   rosuvastatin  (CRESTOR ) tablet 10 mg (has no administration in time range)   sodium chloride  0.9 % bolus 500 mL (0 mLs Intravenous Stopped 06/07/24 0623)   meclizine  (ANTIVERT ) tablet 25 mg (25 mg Oral Given 06/07/24 0418)     Laboratory and Imaging Studies:  Results for orders placed or performed during the hospital encounter of 06/07/24 (from the past 24 hours)   Basic Metabolic Panel    Collection Time: 06/08/24  3:41 AM   Result Value    Glucose 100    BUN 15    Creatinine 1.4 (H)    Calcium  8.8    Sodium 144    Potassium 5.0    Chloride 109    CO2 25    Anion Gap 10.0    GFR 37.8 (L)     US  Abdomen Limited RUQ   Final Result          1. Cholelithiasis   2. Mild gallbladder wall thickening may represent cholesterolosis or   chronic cholecystitis. HIDA scan may be useful for further evaluation   depending on symptoms      Jeana Sor, MD   06/07/2024 9:59 AM      CT Abd/Pelvis without Contrast   Final Result      No acute intra-abdominal noncontrast CT findings.      Cholelithiasis.      Yancy Revering, MD   06/07/2024 6:35 AM      CT Cervical Spine WO Contrast   Final Result       No acute fracture is seen.      [name], MD   06/07/2024 6:48 AM      CT Head WO Contrast   Final Result       No acute intracranial abnormality is seen.      [name], MD   06/07/2024 6:39 AM      XR Chest  AP Portable   Final Result         1. Stable enlarged cardiac silhouette.   2. No acute pulmonary process.      Lamarr Bellini, MD   06/07/2024 5:56 AM         Attestations     Discussed case with ED attending, Degraaf, Angela J, MD, who is agreeable to the plan of care, treatment and disposition.            [  1]  Past Medical History:  Diagnosis Date   . Breast cancer (CMS/HCC) 2014    right breast mastectomy   . Exercise-induced angina     negative work up   . GERD (gastroesophageal reflux disease)    . Headache     resolved   . Hyperlipidemia    . Hypertension    . Hypothyroidism    . Low back pain    . Malignant neoplasm of overlapping sites of right female breast (CMS/HCC) 10/19/2015   . Syncope and collapse     Chronic Vertigo   . Vertigo    [2]  Past Surgical History:  Procedure Laterality Date   . COLONOSCOPY, DIAGNOSTIC (SCREENING)  2021   . COLONOSCOPY, REMOVAL OF TUMOR, POLYP, OR OTHER LESION BY SNARE TECHNIQUE N/A 07/21/2020    Procedure: COLONOSCOPY, POLYPECTOMY;  Surgeon: Jerri Nicolas, MD;  Location: [facility];  Service: Gastroenterology;  Laterality: N/A;   . EGD, BIOPSY N/A 07/21/2020    Procedure: EGD, BIOPSY;  Surgeon: Jerri Nicolas, MD;  Location: [facility];  Service: Gastroenterology;  Laterality: N/A;   . EXPLORATORY LAPAROTOMY N/A 05/22/2021    Procedure: EXPLORATORY LAPAROTOMY;  Surgeon: Odean Rollo HERO, MD;  Location: [facility];  Service: General;  Laterality: N/A;   . HYSTERECTOMY     . LAPAROSCOPY, DIAGNOSTIC N/A 05/22/2021    Procedure: LAPAROSCOPY, DIAGNOSTIC;  Surgeon: Odean Rollo HERO, MD;  Location: [facility];  Service: General;  Laterality: N/A;   . LAPAROTOMY, COLECTOMY, RIGHT Right 05/22/2021    Procedure: LAPAROTOMY, COLECTOMY, RIGHT;  Surgeon: Odean Rollo HERO, MD;  Location: [facility];  Service: General;  Laterality: Right;   . MASTECTOMY Right 2015   [3]  Social History  Tobacco Use   . Smoking status: Never   . Smokeless tobacco: Never   Vaping Use   . Vaping status: Never Used   Substance Use Topics   . Alcohol use: Yes     Comment: wine   . Drug use: No   [4]  Family History  Problem Relation Name  Age of Onset   . Breast cancer Neg Hx     [5]  Allergies  Allergen Reactions   . Morphine  Itching   . Morphine  And Codeine  Itching and Nausea And Vomiting     dizzy      Coleridge Pablo BROCKS, [place]  06/08/24 9488

## 2024-06-07 NOTE — EDIE (Signed)
 [facility] NOTIFICATION 06/06/2024 20:43 Emily [name], Emily [name] Lonna Rabold DOB: Apr 27, 1946 MRN: [id number]    [place] [facility]'s patient encounter information:   FMW:?[id number]  Account [id number]  Billing Account [id number]      Criteria Met      5 ED Visits in 12 Months    Security and Safety  No Security Events were found.  ED Care Guidelines  There are currently no ED Care Guidelines for this patient. Please check your facility's medical records system.        Prescription Monitoring Program  000 ??- Narcotic Use Score   000 ??- Sedative Use Score   000 ??- Stimulant Use Score   000??- Overdose Risk Score  - All Scores range from 000-999 with 75% of the population scoring < 200 and only 1% scoring above 650  - The last digit of the narcotic, sedative, and stimulant score indicates the number of active prescriptions of that type  - Higher Use scores correlate with increased prescribers, pharmacies, mg equiv, and overlapping prescriptions   - Higher Overdose Risk Scores correlate with increased risk of unintentional overdose death   Concerning or unexpectedly high scores should prompt a review of the PMP record; this does not constitute checking PMP for prescribing purposes.    E.D. Visit Count (12 mo.)  Facility Visits   [facility] 7   Total 7   Note: Visits indicate total known visits.     Recent Emergency Department Visit Summary  Date Facility [facility] Type Diagnoses or Chief Complaint    Jun 07, 2024  [place] [facility] [facility]  [place]  [place]  Emergency      fall      May 28, 2024  [place] [facility] [facility]  [place]  [place]  Emergency      Anesthesia of skin      Dizziness      Generalized weakness      Dizziness,Weakness      Feb 04, 2024  [place] [facility] [facility]  [place]  [place]  Emergency      Hypertensive emergency      Dizziness      Hypertension      Nausea      Nausea/Vomitting      Dec 23, 2023  [place] [facility] [place] [facility]  [place]  [place]  Emergency      Other specified noninflammatory disorders  of vagina      Unspecified fall, initial encounter      Lumbago with sciatica, right side      Other chronic pain      Urinary Tract Infection Symptoms      Fall      medic      Nov 25, 2023  [place] [facility] [place] [facility]  [place]  [place]  Emergency      Chest pain, unspecified      Dizziness and giddiness      Vascular complications following infusion, transfusion and therapeutic injection, initial encounter      Headache      Dizziness      BODY PAIN      Oct 19, 2023  [place] [facility]  [place]  [place]  Emergency      Low back pain, unspecified      Pain in right leg      Pain in right shoulder      Other chest pain      Unspecified fall  due to ice and snow, initial encounter      Fall      Aug 06, 2023  [place] [facility]  [place]  [place]  Emergency      Acute kidney failure, unspecified      Other specified soft tissue disorders      Acute cystitis without hematuria      Leg Swelling      WAISTE AND LEG PAIN        Recent Inpatient Visit Summary  Date Facility [facility] Type Diagnoses or Chief Complaint    Feb 04, 2024  [place] [facility] [place] [facility]  [place]  [place]  Medical Surgical      Mixed hyperlipidemia      Essential (primary) hypertension      Hypertensive emergency        Care Team  Provider Specialty Phone Fax Service Dates   [place], [facility] , DO Internal Medicine   Current    DAVIDSON, ARLEY HERO MD M, MD Family Medicine: Adult Medicine [phone] Current    Jannett Walt HERO., M.D. Internal Medicine   Current    SHAWNEE MERL FALCON, M.D. Internal Medicine   Current    JAKIE RAISIN, NP Nurse Practitioner: Adult Health [phone]  Current    JACKLYN MERRIANNE GOODNIGHT Nurse Practitioner: Gerontology [phone] Current      [facility]  This patient has registered at the [facility] Emergency Department  For more information visit: [email or web address]    [email or web address] [email or web address]     PLEASE NOTE:     1.   Any care  recommendations and other clinical information are provided as guidelines or for historical purposes only, and providers should exercise their own clinical judgment when providing care.    2.   You may only use this information for purposes of treatment, payment or health care operations activities, and subject to the limitations of applicable [facility] Policies.    3.   You should consult directly with the organization that provided a care guideline or other clinical history with any questions about additional information or accuracy or completeness of information provided.    ? 2025 [facility] - [email or web address]

## 2024-06-07 NOTE — ED Notes (Signed)
 Unable to obtain IV at this time. Will get another RN to obtain USIV once available. [place], PA updated.

## 2024-06-07 NOTE — Progress Notes (Signed)
 06/07/24 1142 06/07/24 1145 06/07/24 1148   Vital Signs   Heart Rate 68 69 70   Pulse (SpO2) 69 70 72   Resp Rate 16 17 17    BP 123/60 147/74 130/68   BP Location Left arm Left arm Left arm   BP Method Automatic Automatic Automatic   MAP (mmHg) 86 103 92   Patient Position Lying Sitting Standing   Oxygen  Therapy   SpO2 97 % 98 % 98 %   O2 Device None (Room air) None (Room air) None (Room air)     Ortho vital signs as above. Complaints of dizziness and lightheadedness with position change.

## 2024-06-07 NOTE — ED Notes (Signed)
 Currently still waiting for patient to get USIV. No staff available at this time. Patient to be medicated with oral meds per [facility]. Patient resting in bed, respirations even and unlabored, call light within reach

## 2024-06-08 ENCOUNTER — Observation Stay

## 2024-06-08 DIAGNOSIS — I10 Essential (primary) hypertension: Secondary | ICD-10-CM

## 2024-06-08 DIAGNOSIS — Z8639 Personal history of other endocrine, nutritional and metabolic disease: Secondary | ICD-10-CM

## 2024-06-08 DIAGNOSIS — Z853 Personal history of malignant neoplasm of breast: Secondary | ICD-10-CM

## 2024-06-08 LAB — BASIC METABOLIC PANEL
Anion Gap: 10 (ref 5.0–15.0)
BUN: 15 mg/dL (ref 7–21)
CO2: 25 meq/L (ref 17–29)
Calcium: 8.8 mg/dL (ref 7.9–10.2)
Chloride: 109 meq/L (ref 99–111)
Creatinine: 1.4 mg/dL — ABNORMAL HIGH (ref 0.4–1.0)
GFR: 37.8 mL/min/1.73 m2 — ABNORMAL LOW (ref >=60.0)
Glucose: 100 mg/dL (ref 70–100)
Potassium: 5 meq/L (ref 3.5–5.3)
Sodium: 144 meq/L (ref 135–145)

## 2024-06-08 LAB — WHOLE BLOOD GLUCOSE POCT: Whole Blood Glucose POCT: 139 mg/dL — ABNORMAL HIGH (ref 70–100)

## 2024-06-08 MED ORDER — TECHNETIUM TC 99M MEBROFENIN IV KIT
6.0000 | PACK | Freq: Once | INTRAVENOUS | Status: AC | PRN
Start: 2024-06-08 — End: 2024-06-08
  Administered 2024-06-08: 6 via INTRAVENOUS
  Filled 2024-06-08: qty 15

## 2024-06-08 MED ORDER — FLUCONAZOLE 100 MG PO TABS
150.0000 mg | ORAL_TABLET | Freq: Once | ORAL | Status: AC
Start: 2024-06-08 — End: 2024-06-08
  Administered 2024-06-08: 150 mg via ORAL
  Filled 2024-06-08: qty 2

## 2024-06-08 NOTE — Progress Notes (Signed)
 CM attempted to meet with the pt at bedside, but the pt was unavailable. CM to follow up at a later time.     Edsel Loose, MSW  Social Worker Case Manager I  [address] [facility]

## 2024-06-08 NOTE — Plan of Care (Signed)
 Problem: Pain interferes with ability to perform ADL  Goal: Pain at adequate level as identified by patient  Outcome: Progressing  Flowsheets (Taken 06/07/2024 2145 by Theard-Chery, Olguine, RN)  Pain at adequate level as identified by patient:   Identify patient comfort function goal   Assess for risk of opioid induced respiratory depression, including snoring/sleep apnea. Alert healthcare team of risk factors identified.   Assess pain on admission, during daily assessment and/or before any as needed intervention(s)   Reassess pain within 30-60 minutes of any procedure/intervention, per Pain Assessment, Intervention, Reassessment (AIR) Cycle   Evaluate if patient comfort function goal is met   Evaluate patient's satisfaction with pain management progress     Problem: Side Effects from Pain Analgesia  Goal: Patient will experience minimal side effects of analgesic therapy  Outcome: Progressing  Flowsheets (Taken 06/07/2024 1230)  Patient will experience minimal side effects of analgesic therapy:   Assess for changes in cognitive function   Monitor/assess patient's respiratory status (RR depth, effort, breath sounds)   Prevent/manage side effects per LIP orders (i.e. nausea, vomiting, pruritus, constipation, urinary retention, etc.)   Evaluate for opioid-induced sedation with appropriate assessment tool (i.e. POSS)     Problem: Moderate/High Fall Risk Score >5  Goal: Patient will remain free of falls  Outcome: Progressing  Flowsheets (Taken 06/07/2024 2100 by Theard-Chery, Olguine, RN)  Moderate Risk (6-13):   MOD-Perform dangle, stand, walk (DSW) prior to mobilization   MOD-Remain with patient during toileting     Problem: Hemodynamic Status: Cardiac  Goal: Stable vital signs and fluid balance  Outcome: Progressing  Flowsheets (Taken 06/07/2024 2145 by Theard-Chery, Olguine, RN)  Stable vital signs and fluid balance:   Assess signs and symptoms associated with cardiac rhythm changes   Monitor lab values     Problem:  Inadequate Tissue Perfusion  Goal: Adequate tissue perfusion will be maintained  Outcome: Progressing  Flowsheets (Taken 06/07/2024 2145 by Theard-Chery, Olguine, RN)  Adequate tissue perfusion will be maintained:   Monitor/assess lab values and report abnormal values   Monitor/assess for signs of VTE (edema of calf/thigh redness, pain)   Monitor/assess neurovascular status (pulses, capillary refill, pain, paresthesia, paralysis, presence of edema)   Monitor for signs and symptoms of a pulmonary embolism (dyspnea, tachypnea, tachycardia, confusion)   Encourage/assist patient as needed to turn, cough, and perform deep breathing every 2 hours

## 2024-06-08 NOTE — Consults (Signed)
 GENERAL SURGERY  Consultation   [facility] APP Spectralink: [id number]      Consultation Date Time: 06/08/24 11:45 AM  Date of Admission: 06/07/2024     Requesting Physician: Honey Ruthe Look, MD  Consulting Physician: Rollo Pitch   Consulting Service: General Surgery    Reason For Consultation:  ?chronic cholecystitis       Impression:     78 y.o. female admitted due to fall/weakness with incidental finding of cholelithiasis and possible chronic cholecystitis     On exam patient is afebrile, hemodynamically stable, WBC 6.7, H/H 10/33.6. CT A/P shows No acute intra-abdominal noncontrast CT findings. US  shows Mild gallbladder wall thickening may represent cholesterolosis or chronic cholecystitis. Abdomen is soft, non-distended, tender RLQ. Negative Murphy's sign, no RUQ tenderness to deep palpation.      Problem List[1]      Patient has a BMI of 26.3 kg/m2    Overweight: BMI of 25 to 29.9   Recent Labs     06/08/24  0341 06/07/24  0502   Sodium 144 147*     Diagnosis: No additional diagnosis        Recent Labs   Lab 06/07/24  0502   Hemoglobin 10.2*   Hematocrit 33.6*   MCV 88.4   WBC 6.72   Platelet Count 262         Anemia Diagnosis: Unspecified Anemia (currently unable to determine type)  Cr Baseline Estimation (minimum in last 3 months): 1.2 mg/dL  Maximum Cr in last 36 hours: 2 mg/dL    Recent Labs (Last 3 Months)     06/08/24  0341 06/07/24  0502 05/29/24  0520   Creatinine 1.4* 2.0* 1.3*   BUN 15 23* 18       Acute Kidney Injury Diagnosis: Acute kidney injury, present on admission        Plan:     - HIDA  - Frequent falls per chart review over the past year, Gallbladder is likely not etiology of dizziness. Would benefit from further dizziness workup    -The case was discussed with Rollo Pitch MD     History of Present Illness:     Emily [name] is a 78 y.o. female with medical history that includes breast cancer, hypertension, hyperlipidemia, hypothyroidism, GERD who presented to the hospital with  complaints of dizziness and falls.  No complaints of RUQ pain. Had some nausea prior to her fall. No complaints of abdominal pain. No prior episodes of RUQ pain. Denies fever or chills. No vomiting    Past abdominal surgical history: diagnostic lap converted to ex lap with right colectomy 2022 for ischemic bowel secondary to internal hernia.  Hysterectomy, appendectomy  Anticoagulation: none       Past Medical History:     Past Medical History:   Diagnosis Date    Breast cancer (CMS/HCC) 2014    right breast mastectomy    Exercise-induced angina     negative work up    GERD (gastroesophageal reflux disease)     Headache     resolved    Hyperlipidemia     Hypertension     Hypothyroidism     Low back pain     Malignant neoplasm of overlapping sites of right female breast (CMS/HCC) 10/19/2015    Syncope and collapse     Chronic Vertigo    Vertigo        Past Surgical History:   Past Surgical History[2]    Family History:   Family  History[3]    Social History:   Social History[4]    Allergies:   Allergies[5]    Medications:   Prescriptions Prior to Admission[6]    Review of Systems:      12 point review of systems was negative except as is noted in HPI    Physical Exam:     Vitals:    06/08/24 1107   BP: 117/55   Pulse: 66   Resp: 16   Temp: 98 F (36.7 C)   SpO2: 98%       Physical Exam  Constitutional:       General: She is not in acute distress.     Appearance: She is not ill-appearing.   HENT:      Head: Normocephalic and atraumatic.   Cardiovascular:      Pulses: Normal pulses.   Pulmonary:      Effort: Pulmonary effort is normal.   Abdominal:      General: There is no distension.      Palpations: Abdomen is soft.      Tenderness: There is abdominal tenderness in the right lower quadrant. There is no guarding or rebound. Negative signs include Murphy's sign.   Skin:     General: Skin is warm and dry.   Neurological:      Mental Status: She is alert.          Labs:     Results for orders placed or performed during  the hospital encounter of 06/07/24 (from the past 24 hours)   Basic Metabolic Panel    Collection Time: 06/08/24  3:41 AM   Result Value    Glucose 100    BUN 15    Creatinine 1.4 (H)    Calcium  8.8    Sodium 144    Potassium 5.0    Chloride 109    CO2 25    Anion Gap 10.0    GFR 37.8 (L)       Rads:   No results found.    Signed by:  Ortencia JINNY Portela, PA  06/08/24 11:45 AM            [1]   Patient Active Problem List  Diagnosis    Malignant neoplasm of overlapping sites of right female breast (CMS/HCC)    Hypertension    Mixed hyperlipidemia    Hypothyroid    Left-sided weakness    Right sided numbness    COVID-19 ruled out by laboratory testing    Abnormal CT scan, gastrointestinal tract    History of hypothyroidism    History of breast cancer    History of colonic polyps    Dizziness    Left sided numbness    Leg numbness    AKI (acute kidney injury)   [2]   Past Surgical History:  Procedure Laterality Date    COLONOSCOPY, DIAGNOSTIC (SCREENING)  2021    COLONOSCOPY, REMOVAL OF TUMOR, POLYP, OR OTHER LESION BY SNARE TECHNIQUE N/A 07/21/2020    Procedure: COLONOSCOPY, POLYPECTOMY;  Surgeon: Jerri Nicolas, MD;  Location: [facility];  Service: Gastroenterology;  Laterality: N/A;    EGD, BIOPSY N/A 07/21/2020    Procedure: EGD, BIOPSY;  Surgeon: Jerri Nicolas, MD;  Location: [facility];  Service: Gastroenterology;  Laterality: N/A;    EXPLORATORY LAPAROTOMY N/A 05/22/2021    Procedure: EXPLORATORY LAPAROTOMY;  Surgeon: Odean Rollo HERO, MD;  Location: [facility];  Service: General;  Laterality: N/A;    HYSTERECTOMY  LAPAROSCOPY, DIAGNOSTIC N/A 05/22/2021    Procedure: LAPAROSCOPY, DIAGNOSTIC;  Surgeon: Odean Rollo HERO, MD;  Location: [facility];  Service: General;  Laterality: N/A;    LAPAROTOMY, COLECTOMY, RIGHT Right 05/22/2021    Procedure: LAPAROTOMY, COLECTOMY, RIGHT;  Surgeon: Odean Rollo HERO, MD;  Location: [facility];  Service: General;  Laterality: Right;    MASTECTOMY Right  2015   [3]   Family History  Problem Relation Name Age of Onset    Breast cancer Neg Hx     [4]   Social History  Socioeconomic History    Marital status: Widowed   Tobacco Use    Smoking status: Never    Smokeless tobacco: Never   Vaping Use    Vaping status: Never Used   Substance and Sexual Activity    Alcohol use: Yes     Comment: wine    Drug use: No    Sexual activity: Not Currently     Social Drivers of Health     Financial Resource Strain: Low Risk  (02/05/2024)    Overall Financial Resource Strain (CARDIA)     Difficulty of Paying Living Expenses: Not hard at all   Food Insecurity: No Food Insecurity (05/28/2024)    Hunger Vital Sign     Worried About Running Out of Food in the Last Year: Never true     Ran Out of Food in the Last Year: Never true   Transportation Needs: No Transportation Needs (05/28/2024)    PRAPARE - Therapist, art (Medical): No     Lack of Transportation (Non-Medical): No   Physical Activity: Inactive (02/06/2024)    Exercise Vital Sign     Days of Exercise per Week: 0 days     Minutes of Exercise per Session: 0 min   Stress: No Stress Concern Present (02/06/2024)    [employer] of Occupational Health - Occupational Stress Questionnaire     Feeling of Stress : Only a little   Social Connections: Moderately Isolated (02/06/2024)    Social Connection and Isolation Panel     Frequency of Communication with Friends and Family: More than three times a week     Frequency of Social Gatherings with Friends and Family: Three times a week     Attends Religious Services: More than 4 times per year     Active Member of Clubs or Organizations: No     Attends Banker Meetings: Never     Marital Status: Widowed   Intimate Partner Violence: Not At Risk (05/28/2024)    Humiliation, Afraid, Rape, and Kick questionnaire     Fear of Current or Ex-Partner: No     Emotionally Abused: No     Physically Abused: No     Sexually Abused: No   Housing Stability: Not At Risk  (05/28/2024)    Housing Stability NCSS     Do you have housing?: Yes     Are you worried about losing your housing?: No   [5]   Allergies  Allergen Reactions    Morphine  Itching    Morphine  And Codeine  Itching and Nausea And Vomiting     dizzy   [6] (Not in a hospital admission)

## 2024-06-08 NOTE — Plan of Care (Signed)
 Problem: Moderate/High Fall Risk Score >5  Goal: Patient will remain free of falls  Outcome: Progressing  Flowsheets (Taken 06/08/2024 2025)  Moderate Risk (6-13):   MOD-Consider activation of bed alarm if appropriate   MOD-Consider a move closer to Nurses Station   MOD-Place bedside commode and assistive devices out of sight when not in use   MOD-Remain with patient during toileting   LOW-Fall Interventions Appropriate for Low Fall Risk     Problem: Hemodynamic Status: Cardiac  Goal: Stable vital signs and fluid balance  Outcome: Progressing  Flowsheets (Taken 06/08/2024 2025)  Stable vital signs and fluid balance:   Assess signs and symptoms associated with cardiac rhythm changes   Monitor lab values     Problem: Inadequate Tissue Perfusion  Goal: Adequate tissue perfusion will be maintained  Outcome: Progressing  Flowsheets (Taken 06/08/2024 2025)  Adequate tissue perfusion will be maintained:   Monitor/assess lab values and report abnormal values   Monitor/assess for signs of VTE (edema of calf/thigh redness, pain)   Monitor for signs and symptoms of a pulmonary embolism (dyspnea, tachypnea, tachycardia, confusion)   Monitor/assess neurovascular status (pulses, capillary refill, pain, paresthesia, paralysis, presence of edema)   Encourage/assist patient as needed to turn, cough, and perform deep breathing every 2 hours

## 2024-06-08 NOTE — Progress Notes (Addendum)
 [facility]  Internal Medicine Hospitalists  Consult Progress Note        Assessment / Recommendations:      Emily [name] is a 78 y.o. female with past medical history of breast cancer, hypertension, hyperlipidemia, hypothyroidism,  GERD, came in today after a fall with dizziness and generalized weakness     #Generalized weakness  #s/p fall  #dizziness  #AKI  #Hypernatremia  -had AKI last admission as well that improved but did not resolve  -head CT noncontrast showing no acute intracranial abnormality, same with cervical CT  -CT AP showing cholelithiasis with mild gallbladder wall thickening   -US  ordered  -gentle hydration  -antiemetics  -prn meclizine   - US  RUQ showing mild gallbladder wall thickening,   - consult gen surg: ordering HIDA scan    Addendum: plan was to [place] if HIDA was unremarkable; however patient returned to her room and an hour after procedure she began shaking and state she felt numbness around her mouth stating something doesn't feel right; normal POCT blood sugar, vitals, and EKG. Provided reassurance and symptoms resolved. Would like to observe overnight to ensure no further reaction and neuro consult in AM to ensure safe discharge. If symptoms worsen, obtain stat repeat head CT noncontrast and contact neurology     #HTN  -hold blood pressure for now given weakness and recent fall, and AKI     #Hypothyroidism  -continue with levothyroxine      #HLD  -continue with crestor      #Hx of breast cancer  -continue with arimidex     #Ingrown hair  #Vaginal candidiasis  -outpt follow up with GYN or derm  -given 1 time dose of diflucan  this morning           Open Handoff Activity in Sidebar    Additional Diagnoses:   N/a         Subjective:      Feeling better today, loss of appetite.       Objective:      Patient Vitals for the past 24 hrs:   BP Temp Temp src Pulse Resp SpO2   06/08/24 1710 153/73 98.1 F (36.7 C) Oral 75 19 99 %   06/08/24 1107 117/55 98 F (36.7 C) Oral  66 16 98 %   06/08/24 0850 -- -- -- -- 19 --   06/08/24 0706 101/53 98.2 F (36.8 C) Oral (!) 54 18 99 %   06/08/24 0336 113/53 98.7 F (37.1 C) -- 66 20 98 %   06/07/24 2336 90/53 98.7 F (37.1 C) -- 71 19 97 %   06/07/24 1915 118/56 98.3 F (36.8 C) Oral 68 19 99 %       Vital Signs and Nursing notes reviewed  General: awake; No acute distress.  Eyes: Normal conjunctiva  ENT: moist mucous membranes, no lesions  Cardiovascular: Normal S1 and S2 RRR  Lungs: no distress, clear and equal breath sounds bilaterally  Abdomen: soft, non-tender, non-distended; +BS  Extremities: no edema or tenderness  Neuro: alert, oriented x 3, no gross abnormality, no focal neurological deficits  Psych: Normal behavior and mood            CBC:  Recent Labs   Lab 06/07/24  0502   WBC 6.72   Hemoglobin 10.2*   Hematocrit 33.6*   Platelet Count 262        BMP:   Recent Labs   Lab 06/08/24  0341 06/07/24  0502   Sodium  144 147*   Potassium 5.0 4.1   Chloride 109 109   CO2 25 27   BUN 15 23*   Creatinine 1.4* 2.0*   Calcium  8.8 9.3   Glucose 100 93     LFT:   Recent Labs   Lab 06/07/24  0502   Albumin  4.0   Protein, Total 7.0   Bilirubin, Total 0.3   Alkaline Phosphatase 82   ALT 28   AST (SGOT) 38     Coags:   Recent Labs   Lab 06/07/24  0502   PT 10.6   INR 0.9   PTT 35     Images personally reviewed:  NM Hepatobiliary  Result Date: 06/08/2024  1.Normal hepatobiliary scan. No evidence of acute cholecystitis, cystic duct obstruction or common bile duct obstruction. Lillette CHRISTELLA Gillis, MD 06/08/2024 3:51 PM    US  Abdomen Limited RUQ  Result Date: 06/07/2024   1. Cholelithiasis 2. Mild gallbladder wall thickening may represent cholesterolosis or chronic cholecystitis. HIDA scan may be useful for further evaluation depending on symptoms Jeana Sor, MD 06/07/2024 9:59 AM    CT Cervical Spine WO Contrast  Result Date: 06/07/2024   No acute fracture is seen. [name], MD 06/07/2024 6:48 AM    CT Head WO Contrast  Result Date: 06/07/2024   No acute  intracranial abnormality is seen. [name], MD 06/07/2024 6:39 AM    CT Abd/Pelvis without Contrast  Result Date: 06/07/2024  No acute intra-abdominal noncontrast CT findings. Cholelithiasis. Yancy Revering, MD 06/07/2024 6:35 AM    XR Chest  AP Portable  Result Date: 06/07/2024  1. Stable enlarged cardiac silhouette. 2. No acute pulmonary process. Lamarr Bellini, MD 06/07/2024 5:56 AM    MRI brain without contrast  Result Date: 05/28/2024   1.No acute infarct, intracranial hemorrhage, or mass effect. 2.Chronic small vessel ischemic changes. Reyes Argyle, MD 05/28/2024 10:55 PM    CTA  Head & Neck  Result Date: 05/28/2024  1.Normal CT perfusion examination of the head. There is no territorial perfusion abnormality. 2.There is no stenosis of the proximal right internal carotid artery based on NASCET criteria. 3.There is no stenosis of the proximal left internal carotid artery based on NASCET criteria. 4.The vertebrobasilar arterial system is patent. 5.Normal intracranial CT angiogram. There is no proximal intracranial large vessel occlusion. Milan Arne Jumbo, MD 05/28/2024 9:04 AM    CT Perfusion Brain  Result Date: 05/28/2024  1.Normal CT perfusion examination of the head. There is no territorial perfusion abnormality. 2.There is no stenosis of the proximal right internal carotid artery based on NASCET criteria. 3.There is no stenosis of the proximal left internal carotid artery based on NASCET criteria. 4.The vertebrobasilar arterial system is patent. 5.Normal intracranial CT angiogram. There is no proximal intracranial large vessel occlusion. Milan Arne Jumbo, MD 05/28/2024 9:04 AM    CT Head WO Contrast  Result Date: 05/28/2024   1.Mild chronic small vessel ischemic disease. Doylene Patee, MD 05/28/2024 8:09 AM        EKG personally reviewed: NSR    Current Facility-Administered Medications[1]  Infusion Meds[2]  PRN Medications[3]       Signed by: Ardeth Honey Muss, MD             [1]   Current  Facility-Administered Medications   Medication Dose Route Frequency    anastrozole   1 mg Oral QAM    heparin  (porcine)  5,000 Units Subcutaneous Q12H [facility] [place]    levothyroxine   25 mcg Oral Daily at  0600    pantoprazole   40 mg Oral QAM AC    rosuvastatin   10 mg Oral Daily    thiamine   100 mg Oral Daily   [2]   Current Facility-Administered Medications   Medication Dose Route Frequency Last Rate   [3]   Current Facility-Administered Medications   Medication Dose Route    acetaminophen   500 mg Oral    benzocaine -menthol   1 lozenge Buccal    benzonatate   100 mg Oral    carboxymethylcellulose sodium  1 drop Both Eyes    dextrose   15 g of glucose Oral    Or    dextrose   12.5 g Intravenous    Or    dextrose   12.5 g Intravenous    Or    glucagon  (rDNA)  1 mg Intramuscular    melatonin  3 mg Oral    naloxone   0.2 mg Intravenous    saline  2 spray Each Nare

## 2024-06-08 NOTE — Progress Notes (Signed)
 06/08/24 1710   Vital Signs   Level of Consciousness Alert   Temp 98.1 F (36.7 C)   Temp src Oral   Heart Rate 75   Pulse (SpO2) 74   Resp Rate 19   BP 153/73   BP Location Left arm   BP Method Automatic   MAP (mmHg) 105   Patient Position Lying   Oxygen  Therapy   SpO2 99 %   O2 Device None (Room air)     Writer went to answer call bell, patient stated she was not feeling well since HIDA scan. Last known well was around 16:30, eating her lunch after returning from  nuclear medicine. Vitals as above. Paged covering provider via secured text chat, who evaluated patient at the bedside. EKG done, Dr Ruthe reviewed+ no concerns, finger stick BG 139. Remained alert, oriented to person,place,time and situation. Complaints of weakness, but neuro assessment within defined limits. Fall precautions maintained. Call bell within reach. Out of bed to commode, voided without difficulty.

## 2024-06-09 ENCOUNTER — Observation Stay

## 2024-06-09 ENCOUNTER — Encounter: Payer: Self-pay | Admitting: Student in an Organized Health Care Education/Training Program

## 2024-06-09 DIAGNOSIS — R531 Weakness: Secondary | ICD-10-CM

## 2024-06-09 DIAGNOSIS — K8010 Calculus of gallbladder with chronic cholecystitis without obstruction: Secondary | ICD-10-CM

## 2024-06-09 DIAGNOSIS — R42 Dizziness and giddiness: Secondary | ICD-10-CM

## 2024-06-09 DIAGNOSIS — R933 Abnormal findings on diagnostic imaging of other parts of digestive tract: Secondary | ICD-10-CM

## 2024-06-09 DIAGNOSIS — N179 Acute kidney failure, unspecified: Secondary | ICD-10-CM

## 2024-06-09 LAB — BASIC METABOLIC PANEL
Anion Gap: 8 (ref 5.0–15.0)
BUN: 13 mg/dL (ref 7–21)
CO2: 28 meq/L (ref 17–29)
Calcium: 8.9 mg/dL (ref 7.9–10.2)
Chloride: 108 meq/L (ref 99–111)
Creatinine: 1.4 mg/dL — ABNORMAL HIGH (ref 0.4–1.0)
GFR: 37.8 mL/min/1.73 m2 — ABNORMAL LOW (ref >=60.0)
Glucose: 93 mg/dL (ref 70–100)
Potassium: 4.6 meq/L (ref 3.5–5.3)
Sodium: 144 meq/L (ref 135–145)

## 2024-06-09 LAB — URINE SODIUM, RANDOM: Urine Sodium: 47 meq/L

## 2024-06-09 LAB — URINE CREATININE, RANDOM: Urine Creatinine: 127 mg/dL

## 2024-06-09 MED ORDER — SENNOSIDES-DOCUSATE SODIUM 8.6-50 MG PO TABS
1.0000 | ORAL_TABLET | Freq: Two times a day (BID) | ORAL | Status: DC
Start: 2024-06-09 — End: 2024-06-09

## 2024-06-09 MED ORDER — ROSUVASTATIN CALCIUM 40 MG PO TABS: 40.0000 mg | ORAL_TABLET | Freq: Every day | ORAL | 0 refills | Status: AC

## 2024-06-09 MED ORDER — PANTOPRAZOLE SODIUM 40 MG PO TBEC: 40.0000 mg | DELAYED_RELEASE_TABLET | Freq: Every morning | ORAL | 0 refills | Status: AC

## 2024-06-09 MED ORDER — BISACODYL 10 MG RE SUPP
10.0000 mg | Freq: Once | RECTAL | Status: DC
Start: 2024-06-09 — End: 2024-06-09
  Filled 2024-06-09: qty 1

## 2024-06-09 MED ORDER — POLYETHYLENE GLYCOL 3350 17 G PO PACK
17.0000 g | PACK | Freq: Every day | ORAL | Status: DC
Start: 2024-06-09 — End: 2024-06-09
  Administered 2024-06-09: 17 g via ORAL
  Filled 2024-06-09: qty 1

## 2024-06-09 NOTE — Consults (Signed)
 NEUROLOGY CONSULTATION    Date Time: 06/09/24 12:34 PM  Patient Name: Emily [name]  Attending Physician: Juhardeen, Hamzah, MD      Assessment & Plan:   Unclear what happened yesterday - ? HV with peri-oral numbness.    Check orthostatics.  If negative and pt walking well, then [facility] to be discharged.    History of Present Illness:   Asked by Dr. Londa to consult on this 78 yo female with recent fall due to vertigo, also with AKI, chronic vertigo, dehydration.  Pt was set to be discharged yesterday but upon standing up, felt dizzy.  Other reports are that she had shaking and felt peri-oral numbness (pt isn't recalling that).  Told others something doesn't feel right. She ended up being held overnight for observation.  Pt tells me she feels well today, and thinks she can go home.    Past Medical History:     Past Medical History:   Diagnosis Date    Breast cancer (CMS/HCC) 2014    right breast mastectomy    Exercise-induced angina     negative work up    GERD (gastroesophageal reflux disease)     Headache     resolved    Hyperlipidemia     Hypertension     Hypothyroidism     Low back pain     Malignant neoplasm of overlapping sites of right female breast (CMS/HCC) 10/19/2015    Syncope and collapse     Chronic Vertigo    Vertigo        Meds:      Scheduled Meds: PRN Meds:    anastrozole , 1 mg, Oral, QAM  bisacodyl , 10 mg, Rectal, Once  heparin  (porcine), 5,000 Units, Subcutaneous, Q12H SCH  levothyroxine , 25 mcg, Oral, Daily at 0600  pantoprazole , 40 mg, Oral, QAM AC  polyethylene glycol, 17 g, Oral, Daily  rosuvastatin , 10 mg, Oral, Daily  thiamine , 100 mg, Oral, Daily        Continuous Infusions:   acetaminophen , 500 mg, Q4H PRN  benzocaine -menthol , 1 lozenge, Q2H PRN  benzonatate , 100 mg, TID PRN  carboxymethylcellulose sodium, 1 drop, TID PRN  dextrose , 15 g of glucose, PRN   Or  dextrose , 12.5 g, PRN   Or  dextrose , 12.5 g, PRN   Or  glucagon  (rDNA), 1 mg, PRN  melatonin, 3 mg, QHS PRN  naloxone , 0.2  mg, PRN  saline, 2 spray, Q4H PRN          I personally reviewed all of the medications.  Medication list generated using all available resources.  Elder abuse (physical)  - negative  Advanced care plan - reviewed from chart or in discussion with pt or family    Allergies[1]    Social & Family History:   Social History[2]  Family History[3]    Review of Systems:   No eye, ear nose, throat problems; no coughing or wheezing or shortness of breath, No chest pain or orthopnea, no abdominal pain, nausea or vomiting, No pain in the body or extremities, no psychiatric, neurological, endocrine, hematological or cardiac complaints except as noted above.     Physical Exam:   Blood pressure 118/58, pulse 63, temperature 98.2 F (36.8 C), temperature source Oral, resp. rate 15, height 1.626 m (5' 4), weight 69.5 kg (153 lb 3.5 oz), SpO2 100%.    HEENT: Normocephalic. Non-icter, no congestion, no carotid bruits  Lungs:  CTA bil  Cardiac:  S1,S2, normal rate and rhythm  Neck: supple, no  lymphadenopathy, no thyromegaly, no JVD, no cartoid bruits  Extremities: no clubbing, cyanosis, or edema  Skin: no rashes or lesions noted    Neuro:  Level of consciousness:  Alert and appropriate though often difficult to understand  Cognition:  Intact naming, recognition, concentration and following complex commands  Cranial Nerves:  II-XII intact  Strength:  No upper extremity drift, 5/5 strength x 4 extremities  Coordination:  Intact FTN testing  Sensation: Intact x 4 extremities to LT, temp  Gait:  Deferred     Labs:     Recent Labs   Lab 06/09/24  0600 06/08/24  0341 06/07/24  0502   Glucose 93 100 93   BUN 13 15 23*   Creatinine 1.4* 1.4* 2.0*   Calcium  8.9 8.8 9.3   Sodium 144 144 147*   Potassium 4.6 5.0 4.1   Chloride 108 109 109   CO2 28 25 27    Albumin   --   --  4.0   AST (SGOT)  --   --  38   ALT  --   --  28   Bilirubin, Total  --   --  0.3   Alkaline Phosphatase  --   --  82     Recent Labs   Lab 06/07/24  0502   WBC 6.72    Hemoglobin 10.2*   Hematocrit 33.6*   MCV 88.4   MCH 26.8   MCHC 30.4*   Platelet Count 262         Recent Labs     06/07/24  0502   PTT 35   PT 10.6   INR 0.9        NM Hepatobiliary  Result Date: 06/08/2024  1.Normal hepatobiliary scan. No evidence of acute cholecystitis, cystic duct obstruction or common bile duct obstruction. Lillette CHRISTELLA Gillis, MD 06/08/2024 3:51 PM       All recent brain and spine imaging (MRI, CT) results reviewed.    Chart reviewed    Code status confirmed    Case discussed with: patient and Dr. Londa    75 minutes;  involving time spent examining patient, in counseling or coordination of care, reviewing test results, and in documentation.    Signed by: Alm LELON Moritz, MD  Spectralink: [phone]       Answering Service: [phone]         [1]   Allergies  Allergen Reactions    Morphine  Itching    Morphine  And Codeine  Itching and Nausea And Vomiting     dizzy   [2]   Social History  Socioeconomic History    Marital status: Widowed   Tobacco Use    Smoking status: Never    Smokeless tobacco: Never   Vaping Use    Vaping status: Never Used   Substance and Sexual Activity    Alcohol use: Yes     Comment: wine    Drug use: No    Sexual activity: Not Currently     Social Drivers of Health     Financial Resource Strain: Low Risk  (02/05/2024)    Overall Financial Resource Strain (CARDIA)     Difficulty of Paying Living Expenses: Not hard at all   Food Insecurity: No Food Insecurity (05/28/2024)    Hunger Vital Sign     Worried About Running Out of Food in the Last Year: Never true     Ran Out of Food in the Last Year: Never true   Transportation Needs: No Transportation  Needs (05/28/2024)    PRAPARE - Therapist, art (Medical): No     Lack of Transportation (Non-Medical): No   Physical Activity: Inactive (02/06/2024)    Exercise Vital Sign     Days of Exercise per Week: 0 days     Minutes of Exercise per Session: 0 min   Stress: No Stress Concern Present (02/06/2024)    [employer]  of Occupational Health - Occupational Stress Questionnaire     Feeling of Stress : Only a little   Social Connections: Moderately Isolated (02/06/2024)    Social Connection and Isolation Panel     Frequency of Communication with Friends and Family: More than three times a week     Frequency of Social Gatherings with Friends and Family: Three times a week     Attends Religious Services: More than 4 times per year     Active Member of Clubs or Organizations: No     Attends Banker Meetings: Never     Marital Status: Widowed   Intimate Partner Violence: Not At Risk (05/28/2024)    Humiliation, Afraid, Rape, and Kick questionnaire     Fear of Current or Ex-Partner: No     Emotionally Abused: No     Physically Abused: No     Sexually Abused: No   Housing Stability: Not At Risk (05/28/2024)    Housing Stability NCSS     Do you have housing?: Yes     Are you worried about losing your housing?: No   [3]   Family History  Problem Relation Name Age of Onset    Breast cancer Neg Hx

## 2024-06-09 NOTE — Discharge Summary -  Nursing (Signed)
 Pt cleared for d/c, discharge instructions reviewed with  pt.   Understanding verbalized to follow  up with primary MD. Medications and prescriptions reviewed with  understanding.  Pt's son called for transportation needs.  Pt's son arranging [facility] to transport pt home.  IV removed. All questions/concerns addressed

## 2024-06-09 NOTE — Consults (Signed)
 NEPHROLOGY CONSULTATION    Date Time: 06/09/24 11:26 AM  Patient Name: Emily [name]  Requesting Physician: Juhardeen, Hamzah, MD      Reason for Consultation:     AKI  CKD    Assessment:     Acute kidney injury due to contrast induced nephropathy vs pre-renal azotemia, renal function is better, Cr peaked at 2 and now at 1.4  Chronic kidney disease stage IIA due to hypertensive nephrosclerosis and age related atherosclerotic changes, Baseline Cr likely around 1.2  Hypertension with CKD, BP is controlled    Plan:     Avoid NSAIDs and further IV contrast  Encourage oral intake  Dose all medications as per CrCl of 30  Outpatient nephrology follow up    History:   Emily [name] is a 78 y.o. female who presents to the hospital on 06/07/2024 with abdominal pain found ot have chronic cholecystitis    The patient developed AKI after contrast exposure that is now resolving, the patient today has no active complaints     Past Medical History:     Past Medical History:   Diagnosis Date    Breast cancer (CMS/HCC) 2014    right breast mastectomy    Exercise-induced angina     negative work up    GERD (gastroesophageal reflux disease)     Headache     resolved    Hyperlipidemia     Hypertension     Hypothyroidism     Low back pain     Malignant neoplasm of overlapping sites of right female breast (CMS/HCC) 10/19/2015    Syncope and collapse     Chronic Vertigo    Vertigo        Past Surgical History:   Past Surgical History[1]    Family History:   Family History[2]    Social History:   Social History[3]    Allergies:   Allergies[4]    Home medications:     Medications Ordered Prior to Encounter[5]       Current medications:     Current Medications[6]     Review of Systems:   Review of Systems   Cardiovascular:  Negative for chest pain and dyspnea on exertion.   Respiratory:  Negative for shortness of breath.    Gastrointestinal:  Positive for abdominal pain.          Physical Exam:     Vitals:    06/09/24 1110   BP:  118/58   Pulse: 63   Resp: 15   Temp: 98.2 F (36.8 C)   SpO2: 100%     No intake or output data in the 24 hours ending 06/09/24 1126      General: NAD, Alert and awake  Neck: No JVP  CVS: NSR, Normal S1 and S2, No murmurs appreciated  Lungs: Clear to auscultate, Good air entry, no bilateral basal crackles  Neuro: Aox3  Abdomen: Soft, non tender, no distension, no tenderness  Extremities: no peripheral edema     Labs Reviewed:     Results for orders placed or performed during the hospital encounter of 06/07/24 (from the past 24 hours)    Collection Time: 06/08/24  5:32 PM   Result Value    Whole Blood Glucose POCT 139 (H)   Basic Metabolic Panel    Collection Time: 06/09/24  6:00 AM   Result Value    Glucose 93    BUN 13    Creatinine 1.4 (H)    Calcium  8.9  Sodium 144    Potassium 4.6    Chloride 108    CO2 28    Anion Gap 8.0    GFR 37.8 (L)       Recent Labs     06/07/24  0502   WBC 6.72   Hemoglobin 10.2*   Hematocrit 33.6*   Platelet Count 262       Recent Labs     06/09/24  0600 06/08/24  0341   Sodium 144 144   Potassium 4.6 5.0   Chloride 108 109   CO2 28 25   BUN 13 15   Creatinine 1.4* 1.4*   Glucose 93 100   Calcium  8.9 8.8       Recent Labs     06/07/24  0502   AST (SGOT) 38   ALT 28   Alkaline Phosphatase 82   Protein, Total 7.0   Albumin  4.0       Recent Labs     06/07/24  0502   PTT 35   PT 10.6   INR 0.9       Rads:   NM Hepatobiliary  Result Date: 06/08/2024  1.Normal hepatobiliary scan. No evidence of acute cholecystitis, cystic duct obstruction or common bile duct obstruction. Lillette CHRISTELLA Gillis, MD 06/08/2024 3:51 PM      Signed by: Maryana Pittmon                          [1]   Past Surgical History:  Procedure Laterality Date    COLONOSCOPY, DIAGNOSTIC (SCREENING)  2021    COLONOSCOPY, REMOVAL OF TUMOR, POLYP, OR OTHER LESION BY SNARE TECHNIQUE N/A 07/21/2020    Procedure: COLONOSCOPY, POLYPECTOMY;  Surgeon: Jerri Nicolas, MD;  Location: [facility];  Service: Gastroenterology;  Laterality: N/A;    EGD,  BIOPSY N/A 07/21/2020    Procedure: EGD, BIOPSY;  Surgeon: Jerri Nicolas, MD;  Location: [facility];  Service: Gastroenterology;  Laterality: N/A;    EXPLORATORY LAPAROTOMY N/A 05/22/2021    Procedure: EXPLORATORY LAPAROTOMY;  Surgeon: Odean Rollo CHRISTELLA, MD;  Location: [facility];  Service: General;  Laterality: N/A;    HYSTERECTOMY      LAPAROSCOPY, DIAGNOSTIC N/A 05/22/2021    Procedure: LAPAROSCOPY, DIAGNOSTIC;  Surgeon: Odean Rollo CHRISTELLA, MD;  Location: [facility];  Service: General;  Laterality: N/A;    LAPAROTOMY, COLECTOMY, RIGHT Right 05/22/2021    Procedure: LAPAROTOMY, COLECTOMY, RIGHT;  Surgeon: Odean Rollo CHRISTELLA, MD;  Location: [facility];  Service: General;  Laterality: Right;    MASTECTOMY Right 2015   [2]   Family History  Problem Relation Name Age of Onset    Breast cancer Neg Hx     [3]   Social History  Socioeconomic History    Marital status: Widowed   Tobacco Use    Smoking status: Never    Smokeless tobacco: Never   Vaping Use    Vaping status: Never Used   Substance and Sexual Activity    Alcohol use: Yes     Comment: wine    Drug use: No    Sexual activity: Not Currently     Social Drivers of Health     Financial Resource Strain: Low Risk  (02/05/2024)    Overall Financial Resource Strain (CARDIA)     Difficulty of Paying Living Expenses: Not hard at all   Food Insecurity: No Food Insecurity (05/28/2024)    Hunger Vital  Sign     Worried About Programme researcher, broadcasting/film/video in the Last Year: Never true     Ran Out of Food in the Last Year: Never true   Transportation Needs: No Transportation Needs (05/28/2024)    PRAPARE - Therapist, art (Medical): No     Lack of Transportation (Non-Medical): No   Physical Activity: Inactive (02/06/2024)    Exercise Vital Sign     Days of Exercise per Week: 0 days     Minutes of Exercise per Session: 0 min   Stress: No Stress Concern Present (02/06/2024)    [employer] of Occupational Health - Occupational Stress  Questionnaire     Feeling of Stress : Only a little   Social Connections: Moderately Isolated (02/06/2024)    Social Connection and Isolation Panel     Frequency of Communication with Friends and Family: More than three times a week     Frequency of Social Gatherings with Friends and Family: Three times a week     Attends Religious Services: More than 4 times per year     Active Member of Clubs or Organizations: No     Attends Banker Meetings: Never     Marital Status: Widowed   Intimate Partner Violence: Not At Risk (05/28/2024)    Humiliation, Afraid, Rape, and Kick questionnaire     Fear of Current or Ex-Partner: No     Emotionally Abused: No     Physically Abused: No     Sexually Abused: No   Housing Stability: Not At Risk (05/28/2024)    Housing Stability NCSS     Do you have housing?: Yes     Are you worried about losing your housing?: No   [4]   Allergies  Allergen Reactions    Morphine  Itching    Morphine  And Codeine  Itching and Nausea And Vomiting     dizzy   [5]   No current facility-administered medications on file prior to encounter.     Current Outpatient Medications on File Prior to Encounter   Medication Sig Dispense Refill    amLODIPine  (NORVASC ) 10 MG tablet Take 1 tablet (10 mg) by mouth once daily 30 tablet 1    anastrozole  (ARIMIDEX ) 1 MG tablet Take 1 tablet (1 mg total) by mouth every morning 30 tablet 0    aspirin  81 MG chewable tablet Chew 1 tablet (81 mg total) by mouth daily 90 tablet 0    celecoxib  (CeleBREX ) 200 MG capsule Take 1 capsule (200 mg) by mouth once daily      ibandronate (BONIVA) 150 MG tablet Take 1 tablet (150 mg) by mouth every 30 (thirty) days      levothyroxine  (SYNTHROID ) 25 MCG tablet Take 1 tablet (25 mcg) by mouth Once a day at 6:00am      lisinopril  (ZESTRIL ) 40 MG tablet Take 1 tablet (40 mg) by mouth once daily      meclizine  (ANTIVERT ) 12.5 MG tablet Take 1 tablet (12.5 mg) by mouth 3 (three) times daily as needed for Dizziness 30 tablet 0    Multiple  Vitamins-Minerals (MULTIVITAMIN WITH MINERALS) tablet Take 1 tablet by mouth once every morning      pantoprazole  (PROTONIX ) 40 MG tablet Take 1 tablet (40 mg) by mouth every morning before breakfast 30 tablet 0    rosuvastatin  (CRESTOR ) 40 MG tablet Take 1 tablet (40 mg) by mouth once daily 30 tablet 0    thiamine  (B-1) 100  MG tablet Take 1 tablet (100 mg) by mouth once daily     [6]   Current Facility-Administered Medications:     acetaminophen  (TYLENOL ) tablet 500 mg, 500 mg, Oral, Q4H PRN, Farahi Far, Roxane, MD, 500 mg at 06/08/24 0850    anastrozole  (ARIMIDEX ) tablet 1 mg, 1 mg, Oral, QAM, Farahi Far, Roxane, MD, 1 mg at 06/09/24 9147    benzocaine -menthol  (CEPACOL/CHLORASEPTIC) lozenge 1 lozenge, 1 lozenge, Buccal, Q2H PRN, Farahi Far, Roxane, MD    benzonatate  (TESSALON ) capsule 100 mg, 100 mg, Oral, TID PRN, Farahi Far, Roxane, MD    bisacodyl  (DULCOLAX) suppository 10 mg, 10 mg, Rectal, Once, Juhardeen, Hamzah, MD    carboxymethylcellulose (PF) (REFRESH PLUS) 0.5 % ophthalmic solution 1 drop, 1 drop, Both Eyes, TID PRN, Honey Muss, Roxane, MD    dextrose  (GLUCOSE) 40 % oral gel 15 g of glucose, 15 g of glucose, Oral, PRN **OR** dextrose  (D10W) 10% bolus 125 mL, 12.5 g, Intravenous, PRN **OR** dextrose  50 % bolus 12.5 g, 12.5 g, Intravenous, PRN **OR** glucagon  (rDNA) (GLUCAGEN) injection 1 mg, 1 mg, Intramuscular, PRN, Farahi Far, Roxane, MD    heparin  (porcine) injection 5,000 Units, 5,000 Units, Subcutaneous, Q12H SCH, Farahi Far, Roxane, MD, 5,000 Units at 06/09/24 9147    levothyroxine  (SYNTHROID ) tablet 25 mcg, 25 mcg, Oral, Daily at 0600, Honey Muss, Roxane, MD, 25 mcg at 06/09/24 0554    melatonin tablet 3 mg, 3 mg, Oral, QHS PRN, Farahi Far, Roxane, MD    naloxone  (NARCAN ) injection 0.2 mg, 0.2 mg, Intravenous, PRN, Honey Muss, Roxane, MD    pantoprazole  (PROTONIX ) EC tablet 40 mg, 40 mg, Oral, QAM AC, Farahi Far, Roxane, MD, 40 mg at 06/09/24 9147    polyethylene glycol (MIRALAX ) packet 17 g, 17  g, Oral, Daily, Juhardeen, Hamzah, MD    rosuvastatin  (CRESTOR ) tablet 10 mg, 10 mg, Oral, Daily, Khalil, Citra, [place], 10 mg at 06/09/24 0851    saline (OCEAN NASAL SPRAY) 0.65 % nasal solution 2 spray, 2 spray, Each Nare, Q4H PRN, Honey Muss, Roxane, MD    thiamine  (VITAMIN B1) tablet 100 mg, 100 mg, Oral, Daily, Farahi Far, Roxane, MD, 100 mg at 06/09/24 9147    Current Outpatient Medications:     amLODIPine  (NORVASC ) 10 MG tablet, Take 1 tablet (10 mg) by mouth once daily, Disp: 30 tablet, Rfl: 1    anastrozole  (ARIMIDEX ) 1 MG tablet, Take 1 tablet (1 mg total) by mouth every morning, Disp: 30 tablet, Rfl: 0    aspirin  81 MG chewable tablet, Chew 1 tablet (81 mg total) by mouth daily, Disp: 90 tablet, Rfl: 0    celecoxib  (CeleBREX ) 200 MG capsule, Take 1 capsule (200 mg) by mouth once daily, Disp: , Rfl:     ibandronate (BONIVA) 150 MG tablet, Take 1 tablet (150 mg) by mouth every 30 (thirty) days, Disp: , Rfl:     levothyroxine  (SYNTHROID ) 25 MCG tablet, Take 1 tablet (25 mcg) by mouth Once a day at 6:00am, Disp: , Rfl:     lisinopril  (ZESTRIL ) 40 MG tablet, Take 1 tablet (40 mg) by mouth once daily, Disp: , Rfl:     meclizine  (ANTIVERT ) 12.5 MG tablet, Take 1 tablet (12.5 mg) by mouth 3 (three) times daily as needed for Dizziness, Disp: 30 tablet, Rfl: 0    Multiple Vitamins-Minerals (MULTIVITAMIN WITH MINERALS) tablet, Take 1 tablet by mouth once every morning, Disp: , Rfl:     pantoprazole  (PROTONIX ) 40 MG tablet, Take 1 tablet (40 mg)  by mouth every morning before breakfast, Disp: 30 tablet, Rfl: 0    rosuvastatin  (CRESTOR ) 40 MG tablet, Take 1 tablet (40 mg) by mouth once daily, Disp: 30 tablet, Rfl: 0    thiamine  (B-1) 100 MG tablet, Take 1 tablet (100 mg) by mouth once daily, Disp: , Rfl:

## 2024-06-09 NOTE — Progress Notes (Signed)
 GENERAL SURGERY  PROGRESS NOTE  [facility] APP Spectralink: [id number]    Date Time: 06/09/24 6:40 PM  Patient Name: [facility]: 3    ASSESSMENT:     Patient is a 78 y.o.  female admitted due to fall/weakness with incidental finding of cholelithiasis and possible chronic cholecystitis. HIDA negative for acute cholecystitis or obstruction. Lower abdominal pain likely secondary to constipation      Problem List[1]        PLAN:     - Recommend bowel regimen, daily prune juice for constipation.   - No indication for acute general surgery intervention, Surgery will sign off. Please reach out as needed    -This case was discussed with Rollo Pitch     This patient was personally seen and examined by me and I agree with the assessment and plan above.  All notes, labs, and xrays were reviewed.  The patient was seen on the date the note was written.    Imaging reviewed. Negative hida scan  On exam patient feels well. Denies any pain. She does have constipation and likely the right lower pain is due to this.  She normally takes prune juice for this  No pain with eating.  Workup for falls/weakness per primary/neurology  No acute surgical intervention warranted  Will sign off    Rollo CHRISTELLA Pitch, MD , MD [facility] General Surgery  [address]  [place], [place] [address]    [phone]          SUBJECTIVE:     No acute overnight events. Patient is feeling better. Pain has improved. Tolerating diet     OBJECTIVE:   Current Vitals:   Vitals:    06/09/24 1110   BP: 118/58   Pulse: 63   Resp: 15   Temp: 98.2 F (36.8 C)   SpO2: 100%       Intake and Output Summary (Last 24 hours):  I/O last 3 completed shifts:  In: 360 [P.O.:360]  Out: -     Labs:     Results for orders placed or performed during the hospital encounter of 06/07/24 (from the past 24 hours)   Basic Metabolic Panel    Collection Time: 06/09/24  6:00 AM   Result Value    Glucose 93    BUN 13    Creatinine 1.4 (H)    Calcium  8.9     Sodium 144    Potassium 4.6    Chloride 108    CO2 28    Anion Gap 8.0    GFR 37.8 (L)   Urine Creatinine, Random    Collection Time: 06/09/24 12:00 PM   Result Value    Urine Creatinine 127.0   Urine Sodium, Random    Collection Time: 06/09/24 12:00 PM   Result Value    Urine Sodium 47       Rads:   US  Renal Kidney  Result Date: 06/09/2024  1. Bilateral simple renal cysts. 2. Mildly echogenic renal cortices consistent with medical renal disease. 3. Normal urinary bladder. Lamarr Bellini, MD 06/09/2024 1:06 PM      Physical Exam:     Physical Exam  Constitutional:       General: She is not in acute distress.     Appearance: She is not ill-appearing.   HENT:      Head: Normocephalic and atraumatic.   Pulmonary:      Effort: Pulmonary effort is normal.  Abdominal:      General: There is no distension.      Palpations: Abdomen is soft.      Tenderness: There is abdominal tenderness in the right lower quadrant. There is no guarding or rebound.   Skin:     General: Skin is warm and dry.   Neurological:      Mental Status: She is alert.            Signed by:   Rollo CHRISTELLA Pitch, MD   06/09/24 6:40 PM   Spectra  [phone]         [1]   Patient Active Problem List  Diagnosis    Malignant neoplasm of overlapping sites of right female breast (CMS/HCC)    Hypertension    Mixed hyperlipidemia    Hypothyroid    Left-sided weakness    Right sided numbness    COVID-19 ruled out by laboratory testing    Abnormal CT scan, gastrointestinal tract    History of hypothyroidism    History of breast cancer    History of colonic polyps    Dizziness    Left sided numbness    Leg numbness    AKI (acute kidney injury)

## 2024-06-09 NOTE — Plan of Care (Signed)
Problem: Pain interferes with ability to perform ADL  Goal: Pain at adequate level as identified by patient  Outcome: Adequate for Discharge     Problem: Side Effects from Pain Analgesia  Goal: Patient will experience minimal side effects of analgesic therapy  Outcome: Adequate for Discharge     Problem: Moderate/High Fall Risk Score >5  Goal: Patient will remain free of falls  Outcome: Adequate for Discharge     Problem: Hemodynamic Status: Cardiac  Goal: Stable vital signs and fluid balance  Outcome: Adequate for Discharge     Problem: Inadequate Tissue Perfusion  Goal: Adequate tissue perfusion will be maintained  Outcome: Adequate for Discharge

## 2024-06-09 NOTE — Final Progress Note (DC Note for stay less than 48 (Signed)
 Date:06/09/2024   Patient Name: Emily [name]  Attending Physician: Aamilah Augenstein, MD  Today:   BP 118/58   Pulse 63   Temp 98.2 F (36.8 C) (Oral)   Resp 15   Ht 1.626 m (5' 4)   Wt 69.5 kg (153 lb 3.5 oz)   SpO2 100%   BMI 26.30 kg/m   Ranges for the last 24 hours:  Temp:  [98.1 F (36.7 C)-98.8 F (37.1 C)] 98.2 F (36.8 C)  Heart Rate:  [53-75] 63  Resp Rate:  [15-19] 15  BP: (113-153)/(56-73) 118/58    Date of Admission:   06/07/2024    Date of Discharge:   06/08/2024    Outcome of Hospitalization:   Principal Problem:    AKI (acute kidney injury)  Resolved Problems:    * No resolved hospital problems. *     Significant Events:  A 78 year old female with a past medical history of breast cancer, hypertension, hyperlipidemia, hypothyroidism, and GERD presented to the emergency department following a fall associated with dizziness and generalized weakness. Initial evaluation revealed hypernatremia and acute kidney injury (AKI), likely secondary to recent contrast administration. Her renal function improved with intravenous fluids. Nephrology was consulted, and her creatinine peaked at 2.0 mg/dL before improving to 1.4 mg/dL (baseline estimated at 1.2 mg/dL). She was advised to avoid NSAIDs and further contrast exposure .Lisinopril  resumed on discharge as renal function has improved (Cr 1.4, baseline ~1.2), potassium within normal limits, and patient is hemodynamically stable. Recommend recheck BMP in 1 week with PCP  Incidentally, a CT of the abdomen and pelvis demonstrated cholelithiasis with mild gallbladder wall thickening. A right upper quadrant ultrasound confirmed mild gallbladder wall thickening. General surgery evaluated the patient and recommended a HIDA scan, which was negative for acute cholecystitis or biliary obstruction. Her abdominal pain was attributed to constipation, and daily prune juice was recommended.  During hospitalization, the patient also reported perioral numbness.  Neurology was consulted, and orthostatic vitals were found to be within normal limits. She was ultimately cleared for discharge by the neurology team.    Additional Diagnoses:                        Labs:     Lab Results last 48 Hours       Procedure Component Value Units Date/Time    Urine Creatinine, Random [[id number]] Collected: 06/09/24 1200    Specimen: Urine, Clean Catch Updated: 06/09/24 1509     Urine Creatinine 127.0 mg/dL     Urine Sodium, Random [[id number]] Collected: 06/09/24 1200    Specimen: Urine, Clean Catch Updated: 06/09/24 1509     Urine Sodium 47 mEq/L     Basic Metabolic Panel [[id number]]  (Abnormal) Collected: 06/09/24 0600    Specimen: Blood, Venous Updated: 06/09/24 0632     Glucose 93 mg/dL      BUN 13 mg/dL      Creatinine 1.4 mg/dL      Calcium  8.9 mg/dL      Sodium 855 mEq/L      Potassium 4.6 mEq/L      Chloride 108 mEq/L      CO2 28 mEq/L      Anion Gap 8.0     GFR 37.8 mL/min/1.73 m2     Whole Blood Glucose POCT [[id number]]  (Abnormal) Collected: 06/08/24 1732    Specimen: Blood, Capillary Updated: 06/08/24 1748     Whole Blood Glucose POCT 139 mg/dL     Basic  Metabolic Panel [[id number]]  (Abnormal) Collected: 06/08/24 0341    Specimen: Blood, Venous Updated: 06/08/24 0420     Glucose 100 mg/dL      BUN 15 mg/dL      Creatinine 1.4 mg/dL      Calcium  8.8 mg/dL      Sodium 855 mEq/L      Potassium 5.0 mEq/L      Chloride 109 mEq/L      CO2 25 mEq/L      Anion Gap 10.0     GFR 37.8 mL/min/1.73 m2             Procedures performed:   Radiology: all results from this admission  US  Renal Kidney  Result Date: 06/09/2024  1. Bilateral simple renal cysts. 2. Mildly echogenic renal cortices consistent with medical renal disease. 3. Normal urinary bladder. Lamarr Bellini, MD 06/09/2024 1:06 PM    NM Hepatobiliary  Result Date: 06/08/2024  1.Normal hepatobiliary scan. No evidence of acute cholecystitis, cystic duct obstruction or common bile duct obstruction. Lillette CHRISTELLA Gillis, MD 06/08/2024 3:51  PM    US  Abdomen Limited RUQ  Result Date: 06/07/2024   1. Cholelithiasis 2. Mild gallbladder wall thickening may represent cholesterolosis or chronic cholecystitis. HIDA scan may be useful for further evaluation depending on symptoms Jeana Sor, MD 06/07/2024 9:59 AM    CT Cervical Spine WO Contrast  Result Date: 06/07/2024   No acute fracture is seen. [name], MD 06/07/2024 6:48 AM    CT Head WO Contrast  Result Date: 06/07/2024   No acute intracranial abnormality is seen. [name], MD 06/07/2024 6:39 AM    CT Abd/Pelvis without Contrast  Result Date: 06/07/2024  No acute intra-abdominal noncontrast CT findings. Cholelithiasis. Yancy Revering, MD 06/07/2024 6:35 AM    XR Chest  AP Portable  Result Date: 06/07/2024  1. Stable enlarged cardiac silhouette. 2. No acute pulmonary process. Lamarr Bellini, MD 06/07/2024 5:56 AM    MRI brain without contrast  Result Date: 05/28/2024   1.No acute infarct, intracranial hemorrhage, or mass effect. 2.Chronic small vessel ischemic changes. Reyes Argyle, MD 05/28/2024 10:55 PM    CTA  Head & Neck  Result Date: 05/28/2024  1.Normal CT perfusion examination of the head. There is no territorial perfusion abnormality. 2.There is no stenosis of the proximal right internal carotid artery based on NASCET criteria. 3.There is no stenosis of the proximal left internal carotid artery based on NASCET criteria. 4.The vertebrobasilar arterial system is patent. 5.Normal intracranial CT angiogram. There is no proximal intracranial large vessel occlusion. Milan Arne Jumbo, MD 05/28/2024 9:04 AM    CT Perfusion Brain  Result Date: 05/28/2024  1.Normal CT perfusion examination of the head. There is no territorial perfusion abnormality. 2.There is no stenosis of the proximal right internal carotid artery based on NASCET criteria. 3.There is no stenosis of the proximal left internal carotid artery based on NASCET criteria. 4.The vertebrobasilar arterial system is patent. 5.Normal intracranial CT  angiogram. There is no proximal intracranial large vessel occlusion. Milan Arne Jumbo, MD 05/28/2024 9:04 AM    CT Head WO Contrast  Result Date: 05/28/2024   1.Mild chronic small vessel ischemic disease. Doylene Patee, MD 05/28/2024 8:09 AM      Treatment Team:   Attending Provider: Kazden Largo, MD  Consulting Physician: Jeffie Rigoberto Francella JONELLE, MD  Consulting Physician: Laymon Blackwater, MD  Consulting Physician: Hammoodi, Zaid A, MD  Consulting Physician: Golda Bar, MD  Consulting Physician: Arnold Fredericks, MD  Disposition:   Disposition: Home or Self Care    Condition at Discharge:   stable     Unresulted Labs       None            Discharge Instructions:      Follow-up Information       Elesho, Yonah  M, MD .    Specialty: Internal Medicine  Contact information:  [address]  [place] [address]  [phone]                           Activity/Weight bearing status: as tolerated      Discharge Medication List        Taking      amLODIPine  10 MG tablet  Dose: 10 mg  What changed: how much to take  Commonly known as: NORVASC   For: High Blood Pressure  Take 1 tablet (10 mg) by mouth once daily     anastrozole  1 MG tablet  Dose: 1 mg  Commonly known as: ARIMIDEX   For: Early Cancer of the Breast in Postmenopausal Women  Take 1 tablet (1 mg total) by mouth every morning     aspirin  81 MG chewable tablet  Dose: 81 mg  Chew 1 tablet (81 mg total) by mouth daily     ibandronate 150 MG tablet  Dose: 150 mg  Commonly known as: BONIVA  Take 1 tablet (150 mg) by mouth every 30 (thirty) days     levothyroxine  25 MCG tablet  Dose: 25 mcg  Commonly known as: SYNTHROID   Take 1 tablet (25 mcg) by mouth Once a day at 6:00am     lisinopril  40 MG tablet  Dose: 40 mg  Commonly known as: ZESTRIL   Take 1 tablet (40 mg) by mouth once daily     meclizine  12.5 MG tablet  Dose: 12.5 mg  Commonly known as: ANTIVERT   For: Dizzy  Take 1 tablet (12.5 mg) by mouth 3 (three) times daily as needed for  Dizziness     multivitamin with minerals tablet  Dose: 1 tablet  Take 1 tablet by mouth once every morning     pantoprazole  40 MG tablet  Dose: 40 mg  Commonly known as: PROTONIX   For: Gastroesophageal Reflux Disease  Take 1 tablet (40 mg) by mouth every morning before breakfast     rosuvastatin  40 MG tablet  Dose: 40 mg  Commonly known as: CRESTOR   For: High Amount of Fats in the Blood  Take 1 tablet (40 mg) by mouth once daily     thiamine  100 MG tablet  Dose: 100 mg  Commonly known as: B-1  Take 1 tablet (100 mg) by mouth once daily            STOP taking these medications      celecoxib  200 MG capsule  Commonly known as: CeleBREX             Minutes spent coordinating discharge and reviewing discharge plan: 45 minutes   Signed by: Peaches Vanoverbeke, MD

## 2024-06-09 NOTE — Nursing Progress Note (Signed)
 Pt off unit to [place]

## 2024-06-09 NOTE — Discharge Instr - AVS First Page (Addendum)
 Instructions:  Hydration: Drink plenty of fluids unless otherwise directed.  Avoid NSAIDs and contrast dyes (e.g., during imaging tests), as they may affect kidney function.  Constipation: Take prune juice daily. Use stool softeners or laxatives if needed.  Diet: Eat a high-fiber diet and stay well-hydrated to prevent constipation.  Medications:  Resume lisinopril  as kidney function has improved and labs are stable.  Continue all other home medications unless otherwise directed.  Do not take any new over-the-counter medications or supplements without checking with your doctor.  Follow-Up:  See your primary care provider within 1 week for a follow-up and lab work (kidney function and electrolytes)  No surgery needed at this time for gallstones.    When to Seek Medical Attention:  New or worsening abdominal pain  Nausea or vomiting  Confusion, severe fatigue, or dizziness  Decreased urination  Swelling in legs or face  Any other concerning symptoms

## 2024-06-11 ENCOUNTER — Ambulatory Visit (HOSPITAL_BASED_OUTPATIENT_CLINIC_OR_DEPARTMENT_OTHER): Admitting: Obstetrics & Gynecology

## 2024-06-21 NOTE — Progress Notes (Signed)
 Name: Emily [name]      ### Patient Details  Date of Birth: Nov 25, 1945  MRN: [id number]      ### Encounter Details  Arrival Date: 05/28/2024 08:03 AM EDT  Discharge Date: 05/29/2024 04:35 PM EDT  Encounter ID: [id number] 16:[id number]    ### Related interaction  [facility] - TCM V2 Post Discharge Outreach (Post Discharge TCM V2 Outreach 1) ([email or web address])        ### Required Interventions and Feedback     Call Status         Call Status:     No Attempt (edited by TF on 06/21/2024 02:38 PM EDT)    Comments::     Patient received an automated post-discharge outreach call. Incomplete (1st attempt), Incomplete (2nd attempt), Incomplete (3rd attempt). Unable to reach the patient, no further action/outreach needed at this time. (edited by TF on 06/21/2024 02:40 PM EDT)    Verneita Leverne) F.  Care Coordinator, Ambulatory Care Management  [facility]   [address], [place], [place] [address]

## 2024-07-19 ENCOUNTER — Encounter: Payer: Self-pay | Admitting: Internal Medicine

## 2024-08-11 ENCOUNTER — Emergency Department
Admission: EM | Admit: 2024-08-11 | Discharge: 2024-08-11 | Disposition: A | Discharge status: Home / Self Care | Attending: Emergency Medicine | Admitting: Emergency Medicine

## 2024-08-11 ENCOUNTER — Emergency Department

## 2024-08-11 DIAGNOSIS — R52 Pain, unspecified: Secondary | ICD-10-CM

## 2024-08-11 DIAGNOSIS — R079 Chest pain, unspecified: Secondary | ICD-10-CM

## 2024-08-11 DIAGNOSIS — M79602 Pain in left arm: Secondary | ICD-10-CM | POA: Insufficient documentation

## 2024-08-11 DIAGNOSIS — R42 Dizziness and giddiness: Secondary | ICD-10-CM | POA: Insufficient documentation

## 2024-08-11 DIAGNOSIS — R0789 Other chest pain: Secondary | ICD-10-CM | POA: Insufficient documentation

## 2024-08-11 LAB — LAB USE ONLY - CBC WITH DIFFERENTIAL
Absolute Basophils: 0.03 x10 3/uL (ref 0.00–0.08)
Absolute Eosinophils: 0.1 x10 3/uL (ref 0.00–0.44)
Absolute Immature Granulocytes: 0.02 x10 3/uL (ref 0.00–0.07)
Absolute Lymphocytes: 2.33 x10 3/uL (ref 0.42–3.22)
Absolute Monocytes: 0.48 x10 3/uL (ref 0.21–0.85)
Absolute Neutrophils: 3.63 x10 3/uL (ref 1.10–6.33)
Absolute nRBC: 0 x10 3/uL (ref <=0.00)
Basophils %: 0.5 %
Eosinophils %: 1.5 %
Hematocrit: 36 % (ref 34.7–43.7)
Hemoglobin: 11 g/dL — ABNORMAL LOW (ref 11.4–14.8)
Immature Granulocytes %: 0.3 %
Lymphocytes %: 35.4 %
MCH: 26.9 pg (ref 25.1–33.5)
MCHC: 30.6 g/dL — ABNORMAL LOW (ref 31.5–35.8)
MCV: 88 fL (ref 78.0–96.0)
MPV: 10.9 fL (ref 8.9–12.5)
Monocytes %: 7.3 %
Neutrophils %: 55 %
Platelet Count: 302 x10 3/uL (ref 142–346)
Preliminary Absolute Neutrophil Count: 3.63 x10 3/uL (ref 1.10–6.33)
RBC: 4.09 x10 6/uL (ref 3.90–5.10)
RDW: 14 % (ref 11–15)
WBC: 6.59 x10 3/uL (ref 3.10–9.50)
nRBC %: 0 /100{WBCs} (ref <=0.0)

## 2024-08-11 LAB — COMPREHENSIVE METABOLIC PANEL
ALT: 11 U/L (ref <=55)
AST (SGOT): 22 U/L (ref <=41)
Albumin/Globulin Ratio: 1.5 (ref 0.9–2.2)
Albumin: 3.8 g/dL (ref 3.5–4.9)
Alkaline Phosphatase: 69 U/L (ref 37–117)
Anion Gap: 8 (ref 5.0–15.0)
BUN: 17 mg/dL (ref 7–21)
Bilirubin, Total: 0.2 mg/dL (ref 0.2–1.2)
CO2: 29 meq/L (ref 17–29)
Calcium: 9.6 mg/dL (ref 7.9–10.2)
Chloride: 108 meq/L (ref 99–111)
Creatinine: 1.3 mg/dL — ABNORMAL HIGH (ref 0.4–1.0)
GFR: 41.3 mL/min/1.73 m2 — ABNORMAL LOW (ref >=60.0)
Globulin: 2.6 g/dL (ref 2.0–3.6)
Glucose: 92 mg/dL (ref 70–100)
Potassium: 4.4 meq/L (ref 3.5–5.3)
Protein, Total: 6.4 g/dL (ref 6.0–8.3)
Sodium: 145 meq/L (ref 135–145)

## 2024-08-11 LAB — HIGH SENSITIVITY TROPONIN-I: hs Troponin: 2.8 ng/L (ref <=14.0)

## 2024-08-11 LAB — URINALYSIS WITH REFLEX TO MICROSCOPIC EXAM - REFLEX TO CULTURE
Urine Bilirubin: NEGATIVE
Urine Blood: NEGATIVE
Urine Glucose: NEGATIVE
Urine Nitrite: NEGATIVE
Urine Protein: NEGATIVE
Urine Specific Gravity: 1.02 (ref 1.001–1.035)
Urine Urobilinogen: NORMAL mg/dL (ref 0.2–2.0)
Urine pH: 6 (ref 5.0–8.0)

## 2024-08-11 MED ORDER — ACETAMINOPHEN 500 MG PO TABS
1000.0000 mg | ORAL_TABLET | Freq: Once | ORAL | Status: AC
Start: 2024-08-11 — End: 2024-08-11
  Administered 2024-08-11: 1000 mg via ORAL
  Filled 2024-08-11: qty 2

## 2024-08-11 NOTE — ED Provider Notes (Addendum)
 [facility]  Emergency Department Physician Note      Diagnosis/Disposition     ED Disposition:  Discharge    ED Diagnosis:     Acute generalized body pain  Chest pain, unspecified type    Discharge Medication List as of 08/11/2024  8:25 PM          History of Present Illness      Chief Complaint: Emesis, Dizziness, and Leg Pain     78 y.o. female with past medical history as below   History of Present Illness  Emily [name] is a 78 year old female who presents with generalized body pain and chest pain.    Generalized pain  - Diffuse pain involving the entire body for the past five months  - Pain includes the chest region  - Pain managed with Tylenol , last dose taken yesterday    Chest pain  - Chest pain present as part of generalized body pain  - No additional details regarding onset, character, or associated symptoms provided    Independent Historian (other than patient): No  Additional History Provided by Independent Historian:      Physical Exam     ED Triage Vitals   Encounter Vitals Group      BP 08/11/24 1718 157/81      Girls Systolic BP Percentile --       Girls Diastolic BP Percentile --       Boys Systolic BP Percentile --       Boys Diastolic BP Percentile --       Heart Rate 08/11/24 1718 74      Resp Rate 08/11/24 1718 18      Temp 08/11/24 1718 98.2 F (36.8 C)      Temp src 08/11/24 1718 Oral      SpO2 08/11/24 1718 98 %      Weight --       Height --       Head Circumference --       Peak Flow --       Pain Score 08/11/24 1715 9      Pain Loc --       Pain Education --       Exclude from Growth Chart --       Physical Exam   Nursing note and vitals reviewed.    Constitutional: non-toxic  Head: Atraumatic.  Eyes: PERRL. EOMI. No scleral icterus. No nystagmus  ENT: Mucous membranes are moist and intact. Oropharynx is clear. Patent airway.  Neck: Supple. No cervical lymphadenopathy.  Cardiovascular: Regular rate. Regular rhythm. No murmurs, rubs, or gallops.  Pulmonary/Chest: No  evidence of respiratory distress. Clear to auscultation bilaterally. No wheezing, rales or rhonchi.   GI: Soft, non-distended abdomen. No tenderness to palpation of abdomen.  Extremities: No edema. No deformity.  Skin: No rash.   Neurological: Awake, alert and oriented x 3. CN II-XII intact. Strength intact. Sensation intact. Normal finger to nose. Normal speech  Psychiatric: Appropriate affect. Appropriate mood. Appropriate behavior.        Medical Decision Making        PRIMARY PROBLEM LIST      1. Acute illness/injury with risk to life or bodily function (based on differential diagnosis or evaluation) DIAGNOSIS:see below   Advanced age increases complexity of visit and chances of a severe illness           DISCUSSION      Chest pain-  Diff- acs, ptx,  pneumonia  General pain involving her entire body  Diff- problem with lytes, depression/anxiety/somatic disorder  Left leg radicular pain. Don't suspect any spine emergency today.  Patient also complains of dizziness to triage nurse but not to me.  She told that she had fallen.  She is ambulatory here.  She has a nonfocal neuroexam.  I do not suspect stroke.  She has a history of vertigo with multiple ED visits for this.  No history of CVA.  I reviewed all tests.  ACS or other life-threatening cause of chest pain not apparent.  Recommended Tylenol  for pain.  I recommend patient follow-up with her PCP.  She tell me she is going to see her PCP this week.  I discharge patient home.    I did review chest x-ray reading indicating right lung atelectasis versus pneumonitis.  She has a normal lung exam.  She is not complaining of any cough to me fever.  White count is normal.  Do not clinically suspect pneumonia.      Additional Notes                                            Vital Signs: Reviewed the patient's vital signs.   Nursing Notes: Reviewed and utilized available nursing notes.   Medical Records Reviewed: Reviewed available past medical records.   Counseling: The  emergency provider has spoken with the patient and discussed today's findings, in addition to providing specific details for the plan of care. Questions are answered and there is agreement with the plan.     CRITICAL CARE/PROCEDURES    Procedures         CARDIAC STUDIES      The following cardiac studies were independently interpreted by me the Emergency Medicine Provider. For full cardiac study results please see chart.              EKG 1 interpreted by me (ED provider)     Time Interpreted: 1733  Rate: 60-100   Rhythm: Normal Sinus Rhythm   ST segments: No acute changes   STEMI?: No   EKG interpretation: Normal                                 EMERGENCY IMAGING STUDIES      The following imaging studies were independently interpreted by me (emergency medicine provider):     I read chest x-ray and appreciate no pneumothorax                                      Supplemental Encounter Data   Medical History[1]  Past Surgical History[2]  Social History[3]  Family History[4]  Allergies[5]    Medications Administered:  Medications   acetaminophen  (TYLENOL ) tablet 1,000 mg (1,000 mg Oral Given 08/11/24 2027)     Laboratory and Imaging Studies:  Results for orders placed or performed during the hospital encounter of 08/11/24 (from the past 24 hours)   Comprehensive Metabolic Panel    Collection Time: 08/11/24  6:37 PM   Result Value    Glucose 92    BUN 17    Creatinine 1.3 (H)    Sodium 145    Potassium 4.4    Chloride 108  CO2 29    Calcium  9.6    Anion Gap 8.0    GFR 41.3 (L)    AST (SGOT) 22    ALT 11    Alkaline Phosphatase 69    Albumin  3.8    Protein, Total 6.4    Globulin 2.6    Albumin /Globulin Ratio 1.5    Bilirubin, Total 0.2   CBC with Differential (Component)    Collection Time: 08/11/24  6:37 PM   Result Value    WBC 6.59    Hemoglobin 11.0 (L)    Hematocrit 36.0    Platelet Count 302    MPV 10.9    RBC 4.09    MCV 88.0    MCH 26.9    MCHC 30.6 (L)    RDW 14    nRBC % 0.0    Absolute nRBC 0.00     Preliminary Absolute Neutrophil Count 3.63    Neutrophils % 55.0    Lymphocytes % 35.4    Monocytes % 7.3    Eosinophils % 1.5    Basophils % 0.5    Immature Granulocytes % 0.3    Absolute Neutrophils 3.63    Absolute Lymphocytes 2.33    Absolute Monocytes 0.48    Absolute Eosinophils 0.10    Absolute Basophils 0.03    Absolute Immature Granulocytes 0.02   High Sensitivity Troponin-I at 0 hrs    Collection Time: 08/11/24  6:47 PM   Result Value    hs Troponin 2.8   Urinalysis with Reflex to Microscopic Exam and Culture    Collection Time: 08/11/24  7:03 PM    Specimen: Urine, Clean Catch   Result Value    Urine Color Yellow    Urine Clarity Clear    Urine Specific Gravity 1.020    Urine pH 6.0    Urine Leukocyte Esterase Small (A)    Urine Nitrite Negative    Urine Protein Negative    Urine Glucose Negative    Urine Ketones Trace (A)    Urine Urobilinogen Normal    Urine Bilirubin Negative    Urine Blood Negative    RBC, UA 3-5    Urine WBC 0-5    Urine Squamous Epithelial Cells 0-5     Chest AP Portable   Final Result      New small region of airspace disease in the right lower lung zone may   represent atelectasis or pneumonitis      Jeana RONAL Sor, MD   08/11/2024 7:13 PM                   Germaine Massie ORN, MD  08/11/24 2303         [1]   Past Medical History:  Diagnosis Date    Breast cancer (CMS/HCC) 2014    right breast mastectomy    Exercise-induced angina     negative work up    GERD (gastroesophageal reflux disease)     Headache     resolved    Hyperlipidemia     Hypertension     Hypothyroidism     Low back pain     Malignant neoplasm of overlapping sites of right female breast (CMS/HCC) 10/19/2015    Syncope and collapse     Chronic Vertigo    Vertigo    [2]   Past Surgical History:  Procedure Laterality Date    COLONOSCOPY, DIAGNOSTIC (SCREENING)  2021    COLONOSCOPY, REMOVAL OF TUMOR, POLYP, OR OTHER  LESION BY SNARE TECHNIQUE N/A 07/21/2020    Procedure: COLONOSCOPY, POLYPECTOMY;  Surgeon: Jerri Nicolas, MD;   Location: [facility];  Service: Gastroenterology;  Laterality: N/A;    EGD, BIOPSY N/A 07/21/2020    Procedure: EGD, BIOPSY;  Surgeon: Jerri Nicolas, MD;  Location: [facility];  Service: Gastroenterology;  Laterality: N/A;    EXPLORATORY LAPAROTOMY N/A 05/22/2021    Procedure: EXPLORATORY LAPAROTOMY;  Surgeon: Odean Rollo HERO, MD;  Location: [facility];  Service: General;  Laterality: N/A;    HYSTERECTOMY      LAPAROSCOPY, DIAGNOSTIC N/A 05/22/2021    Procedure: LAPAROSCOPY, DIAGNOSTIC;  Surgeon: Odean Rollo HERO, MD;  Location: [facility];  Service: General;  Laterality: N/A;    LAPAROTOMY, COLECTOMY, RIGHT Right 05/22/2021    Procedure: LAPAROTOMY, COLECTOMY, RIGHT;  Surgeon: Odean Rollo HERO, MD;  Location: [facility];  Service: General;  Laterality: Right;    MASTECTOMY Right 2015   [3]   Social History  Tobacco Use    Smoking status: Never    Smokeless tobacco: Never   Vaping Use    Vaping status: Never Used   Substance Use Topics    Alcohol use: Yes     Comment: wine    Drug use: No   [4]   Family History  Problem Relation Name Age of Onset    Breast cancer Neg Hx     [5]   Allergies  Allergen Reactions    Morphine  Itching    Morphine  And Codeine  Itching and Nausea And Vomiting     dizzy        Germaine Massie ORN, MD  08/11/24 2304

## 2024-08-11 NOTE — EDIE (Signed)
 [facility] NOTIFICATION 08/11/2024 16:20 Emily [name], Emily [name] DOB: 10-30-1945 MRN: [id number]    [place] [facility]'s patient encounter information:   FMW:?[id number]  Account [id number]  Billing Account [id number]      Criteria Met      5 ED Visits in 12 Months    Security and Safety  No Security Events were found.  ED Care Guidelines  There are currently no ED Care Guidelines for this patient. Please check your facility's medical records system.        Prescription Monitoring Program  000 ??- Narcotic Use Score   000 ??- Sedative Use Score   000 ??- Stimulant Use Score   000??- Overdose Risk Score  - All Scores range from 000-999 with 75% of the population scoring < 200 and only 1% scoring above 650  - The last digit of the narcotic, sedative, and stimulant score indicates the number of active prescriptions of that type  - Higher Use scores correlate with increased prescribers, pharmacies, mg equiv, and overlapping prescriptions   - Higher Overdose Risk Scores correlate with increased risk of unintentional overdose death   Concerning or unexpectedly high scores should prompt a review of the PMP record; this does not constitute checking PMP for prescribing purposes.    E.D. Visit Count (12 mo.)  Facility Visits   [place] [facility] 8   Total 8   Note: Visits indicate total known visits.     Recent Emergency Department Visit Summary  Date Facility [facility] Type Diagnoses or Chief Complaint    Aug 11, 2024  [place] [facility]  [place]  [place]  Emergency      Constipation; Leg Pain      Jun 07, 2024  [place] [facility]  [place]  [place]  Emergency      Acute kidney failure, unspecified      Dizziness and giddiness      Foreign Body in Vagina      fall      Jun 07, 2024  [place] [facility]  [place]  [place]  Emergency      Essential (primary) hypertension      Mixed hyperlipidemia      Calculus of gallbladder without cholecystitis without obstruction      Dizziness and giddiness       Acute kidney failure, unspecified      May 28, 2024  [place] [facility] [facility]  [place]  [place]  Emergency      Anesthesia of skin      Dizziness      Generalized weakness      Dizziness,Weakness      Feb 04, 2024  [place] [facility]  [place]  [place]  Emergency      Hypertensive emergency      Dizziness      Hypertension      Nausea      Nausea/Vomitting      Dec 23, 2023  [place] [facility] [place] [facility]  [place]    Emergency      Other specified noninflammatory disorders of vagina      Unspecified fall, initial encounter      Lumbago with sciatica, right side      Other chronic pain      Urinary Tract Infection Symptoms      Fall      medic      Nov 25, 2023  [place] [facility] [place] [facility]  [place]  [place]  Emergency      Chest pain, unspecified      Dizziness and giddiness      Vascular complications following infusion, transfusion and therapeutic injection, initial encounter      Headache      Dizziness      BODY PAIN      Oct 19, 2023  [place] [facility] [facility]  [place]  [place]  Emergency      Low back pain, unspecified      Pain in right leg      Pain in right shoulder      Other chest pain      Unspecified fall due to ice and snow, initial encounter      Fall        Recent Inpatient Visit Summary  Date Facility [facility] Type Diagnoses or Chief Complaint    Feb 04, 2024  [place] [facility] [place] [facility]  [place]  [place]  Medical Surgical      Mixed hyperlipidemia      Essential (primary) hypertension      Hypertensive emergency        Care Team  Provider Specialty Phone Fax Service Dates   Chance, [facility] , DO Internal Medicine   Current    DAVIDSON, ARLEY HERO MD M, MD Family Medicine: Adult Medicine [phone] Current    Jannett, [place]  M., M.D. Internal Medicine   Current    SHAWNEE MERL FALCON, M.D. Internal Medicine   Current    JAKIE RAISIN, NP Nurse Practitioner: Adult Health [phone]  Current    JACKLYN MERRIANNE GOODNIGHT Nurse Practitioner: Gerontology [phone] Current      [facility]  This patient has  registered at the [facility] Emergency Department  For more information visit: [email or web address]    VHI [email or web address]     PLEASE NOTE:     1.   Any care recommendations and other clinical information are provided as guidelines or for historical purposes only, and providers should exercise their own clinical judgment when providing care.    2.   You may only use this information for purposes of treatment, payment or health care operations activities, and subject to the limitations of applicable [facility] Policies.    3.   You should consult directly with the organization that provided a care guideline or other clinical history with any questions about additional information or accuracy or completeness of information provided.    ? 2025 [facility] - [email or web address]

## 2024-08-12 LAB — ECG 12-LEAD
Atrial Rate: 67 {beats}/min
IHS MUSE NARRATIVE AND IMPRESSION: NORMAL
P Axis: 62 degrees
P-R Interval: 164 ms
Q-T Interval: 396 ms
QRS Duration: 86 ms
QTC Calculation (Bezet): 418 ms
R Axis: 44 degrees
T Axis: 42 degrees
Ventricular Rate: 67 {beats}/min

## 2024-08-12 LAB — LAB USE ONLY - URINE GRAY CULTURE HOLD TUBE

## 2024-08-20 ENCOUNTER — Emergency Department

## 2024-08-20 ENCOUNTER — Emergency Department
Admission: EM | Admit: 2024-08-20 | Discharge: 2024-08-20 | Disposition: A | Discharge status: Home / Self Care | Attending: Student in an Organized Health Care Education/Training Program | Admitting: Student in an Organized Health Care Education/Training Program

## 2024-08-20 DIAGNOSIS — B3731 Acute candidiasis of vulva and vagina: Secondary | ICD-10-CM | POA: Insufficient documentation

## 2024-08-20 DIAGNOSIS — R0789 Other chest pain: Secondary | ICD-10-CM | POA: Insufficient documentation

## 2024-08-20 DIAGNOSIS — M5432 Sciatica, left side: Secondary | ICD-10-CM | POA: Insufficient documentation

## 2024-08-20 DIAGNOSIS — R079 Chest pain, unspecified: Secondary | ICD-10-CM

## 2024-08-20 LAB — LAB USE ONLY - CBC WITH DIFFERENTIAL
Absolute Basophils: 0.04 x10 3/uL (ref 0.00–0.08)
Absolute Eosinophils: 0.16 x10 3/uL (ref 0.00–0.44)
Absolute Immature Granulocytes: 0.01 x10 3/uL (ref 0.00–0.07)
Absolute Lymphocytes: 1.54 x10 3/uL (ref 0.42–3.22)
Absolute Monocytes: 0.38 x10 3/uL (ref 0.21–0.85)
Absolute Neutrophils: 3.83 x10 3/uL (ref 1.10–6.33)
Absolute nRBC: 0 x10 3/uL (ref <=0.00)
Basophils %: 0.7 %
Eosinophils %: 2.7 %
Hematocrit: 35.6 % (ref 34.7–43.7)
Hemoglobin: 11.2 g/dL — ABNORMAL LOW (ref 11.4–14.8)
Immature Granulocytes %: 0.2 %
Lymphocytes %: 25.8 %
MCH: 27.4 pg (ref 25.1–33.5)
MCHC: 31.5 g/dL (ref 31.5–35.8)
MCV: 87 fL (ref 78.0–96.0)
MPV: 10.9 fL (ref 8.9–12.5)
Monocytes %: 6.4 %
Neutrophils %: 64.2 %
Platelet Count: 327 x10 3/uL (ref 142–346)
Preliminary Absolute Neutrophil Count: 3.83 x10 3/uL (ref 1.10–6.33)
RBC: 4.09 x10 6/uL (ref 3.90–5.10)
RDW: 13 % (ref 11–15)
WBC: 5.96 x10 3/uL (ref 3.10–9.50)
nRBC %: 0 /100{WBCs} (ref <=0.0)

## 2024-08-20 LAB — COMPREHENSIVE METABOLIC PANEL
ALT: 24 U/L (ref <=55)
AST (SGOT): 29 U/L (ref <=41)
Albumin/Globulin Ratio: 1.4 (ref 0.9–2.2)
Albumin: 4.2 g/dL (ref 3.5–4.9)
Alkaline Phosphatase: 84 U/L (ref 37–117)
Anion Gap: 5 (ref 5.0–15.0)
BUN: 17 mg/dL (ref 7–21)
Bilirubin, Total: 0.2 mg/dL (ref 0.2–1.2)
CO2: 30 meq/L — ABNORMAL HIGH (ref 17–29)
Calcium: 9.3 mg/dL (ref 7.9–10.2)
Chloride: 104 meq/L (ref 99–111)
Creatinine: 1.2 mg/dL — ABNORMAL HIGH (ref 0.4–1.0)
GFR: 45.5 mL/min/1.73 m2 — ABNORMAL LOW (ref >=60.0)
Globulin: 3 g/dL (ref 2.0–3.6)
Glucose: 96 mg/dL (ref 70–100)
Potassium: 4.3 meq/L (ref 3.5–5.3)
Protein, Total: 7.2 g/dL (ref 6.0–8.3)
Sodium: 139 meq/L (ref 135–145)

## 2024-08-20 LAB — URINALYSIS WITH REFLEX TO MICROSCOPIC EXAM - REFLEX TO CULTURE
Urine Bilirubin: NEGATIVE
Urine Blood: NEGATIVE
Urine Glucose: NEGATIVE
Urine Ketones: NEGATIVE mg/dL
Urine Leukocyte Esterase: NEGATIVE
Urine Nitrite: NEGATIVE
Urine Protein: NEGATIVE
Urine Specific Gravity: 1.005 (ref 1.001–1.035)
Urine Urobilinogen: NORMAL mg/dL (ref 0.2–2.0)
Urine pH: 7 (ref 5.0–8.0)

## 2024-08-20 LAB — ECG 12-LEAD
Atrial Rate: 53 {beats}/min
P Axis: 47 degrees
P-R Interval: 184 ms
Q-T Interval: 440 ms
QRS Duration: 86 ms
QTC Calculation (Bezet): 412 ms
R Axis: 26 degrees
T Axis: 34 degrees
Ventricular Rate: 53 {beats}/min

## 2024-08-20 LAB — LAB USE ONLY - URINE GRAY CULTURE HOLD TUBE

## 2024-08-20 LAB — HIGH SENSITIVITY TROPONIN-I: hs Troponin: <2.7 ng/L (ref <=14.0)

## 2024-08-20 MED ORDER — IOHEXOL 350 MG/ML IV SOLN
90.0000 mL | Freq: Once | INTRAVENOUS | Status: AC | PRN
Start: 2024-08-20 — End: 2024-08-20
  Administered 2024-08-20: 90 mL via INTRAVENOUS
  Filled 2024-08-20: qty 500

## 2024-08-20 MED ORDER — FLUCONAZOLE 100 MG PO TABS
150.0000 mg | ORAL_TABLET | Freq: Once | ORAL | Status: AC
Start: 2024-08-20 — End: 2024-08-20
  Administered 2024-08-20: 150 mg via ORAL

## 2024-08-20 NOTE — ED Provider Notes (Signed)
 [facility]  Emergency Department APP H&P     Diagnosis/Disposition   ED Disposition:  Discharge    ED Diagnosis:     Sciatica of left side  Candida vaginitis  Chest pain, unspecified type    Discharge Prescription    No medications on file           History of Present Illness     Nursing Triage Note: c/o intermittent chest pain since last night and reporting SOB when she moves around. c/o right lower back pain, left hip pain that radiates down her leg. c/o sharp pain to her hip when putting weight on her leg.  Chief Complaint: Hip Pain and Chest Pain    HPI 78 y.o.female   History of Present Illness  Emily [name] is a 78 year old female who presents with back pain and chest pain.    She experiences intermittent back pain, with the most recent exacerbation occurring last night. She uses Tylenol  for pain management.    Intermittent chest pain is also present, with the last episode occurring last night. No current chest pain is noted.    She experiences tingling or numbness in her left leg. No vomiting, fevers, or pain during urination.         Physical Exam   Pulse 78  BP 176/79  Resp 18  SpO2 99 %  Temp 98.3 F (36.8 C)     Physical Exam  Vitals and nursing note reviewed.   Constitutional:       General: She is not in acute distress.     Appearance: Normal appearance. She is ill-appearing. She is not toxic-appearing.      Comments: Chronically ill appearing on exam   HENT:      Head: Normocephalic and atraumatic.   Eyes:      Extraocular Movements: Extraocular movements intact.      Pupils: Pupils are equal, round, and reactive to light.   Cardiovascular:      Rate and Rhythm: Normal rate and regular rhythm.      Pulses: Normal pulses.      Heart sounds: Normal heart sounds.   Pulmonary:      Effort: Pulmonary effort is normal. No tachypnea.      Breath sounds: Normal breath sounds. No decreased breath sounds, wheezing, rhonchi or rales.   Chest:      Chest wall: No tenderness.    Abdominal:      General: Abdomen is flat. Bowel sounds are normal. There is no abdominal bruit.   Musculoskeletal:         General: Normal range of motion.        Arms:       Cervical back: Normal range of motion.      Right lower leg: No tenderness. No edema.      Left lower leg: No tenderness. No edema.      Comments: TTP to left paraspinal muscles of lumbar spine  No rashes noted  No edema or ecchymosis   No open wounds  No crepitus   No deformities   No laxity  Distal pulses strong and equal bilaterally and intact        Skin:     General: Skin is warm.      Capillary Refill: Capillary refill takes less than 2 seconds.   Neurological:      General: No focal deficit present.      Mental Status: She is alert.   Psychiatric:  Mood and Affect: Mood normal.            Medical Decision Making     Initial Differential Diagnosis:  Initial differential diagnosis to include but not limited to: ACS, PNA, PE, MSK, sciatica, UTI, pyelonephritis  Plan:  CBC, CMP, Trop, UA, EKG  CXR    Final Impression:  The patient was deemed stable for discharge. They were given strict return precautions as it relates to their presumed diagnosis, verbalized understanding of these precautions and agreed to follow up as instructed. All questions were answered prior to discharge.         Medical Decision Making  ACS, PNA, PE, MSK, sciatica, UTI, pyelonephritis     CBC, CMP, Trop, UA, EKG   CXR    Pt is chronically ill appearing  Ambulates with steady gait    TTP to left paraspinal muscles of lumbar spine  No rashes noted  No edema or ecchymosis   No open wounds  No crepitus   No deformities   No laxity  Distal pulses strong and equal bilaterally and intact   +straight leg raise to left extremity     Lungs clear  No Chest wall tenderness   RRR  No rubs or gallops  No murmur   Distal pulses strong, equal bilaterally   No lower extremity edema noted  Abd is soft, non tender    Will add CTA chest and DVT of LLE to rule out DVT or  PE  Labs unremarkable  Pt remains stable at this time       Dr. Mayme evaluated pt for vaginal discharge, noted to have candida infection  Plan for diflucan  in ED     DVT study was negative  CTA of the chest was negative for PE    Patient remained stable    Symptoms are consistent with sciatica    Plan for spine clinic follow-up, along with her cardiologist and GYN  Return precautions were discussed        Amount and/or Complexity of Data Reviewed  Labs: ordered.  Radiology: ordered.    Risk  Prescription drug management.        Records Reviewed (internal and external)?: N/A  Was management discussed with a consultant?: N/A  Diagnostic test considered and not performed: N/A  Prescription medications considered and not given: N/A    Was the decision around the need for surgery discussed with a consultant?: N/A  Social Determinants of Health Considerations: N/A  Was there decision to not resuscitate or to de-escalate care due to poor prognosis?: N/A             Interpretations   O2 Sat:  The patient's oxygen  saturation was 99 % on room air. This was independently interpreted by me as Normal.                 Radiology: All available radiologist's interpretations were reviewed if not separately interpreted by me.            Procedures      Procedures      Supplemental Encounter Data   Medical History[6]  Past Surgical History[7]  Social History[8]  Family History[9]  Allergies[10]    Encounter Orders:  Orders Placed This Encounter   Procedures    Vaginal Bacterial vaginosis, Candida spp., and Trichomonas vaginalis, PCR    Chlamydia trachomatis, Neisseria gonorrhea and Trichomonas vaginalis, PCR    Chest AP Portable    CT Angio Chest (PE or trauma  protocol)    US  Venous Duplex Doppler Leg Left    CBC with Differential (Order)    Comprehensive Metabolic Panel    High Sensitivity Troponin-I    Urinalysis with Reflex to Microscopic Exam and Culture    Urine Elnor Culture Hold Tube    CBC with Differential (Component)     ECG 12 Lead     Medications Administered:  Medications   fluconazole  (DIFLUCAN ) tablet 150 mg (has no administration in time range)   iohexol  (OMNIPAQUE ) 350 MG/ML injection 90 mL (90 mLs Intravenous Imaging Agent Given 08/20/24 1052)     Laboratory and Imaging Studies:  Results for orders placed or performed during the hospital encounter of 08/20/24 (from the past 24 hours)   Comprehensive Metabolic Panel    Collection Time: 08/20/24  9:36 AM   Result Value    Glucose 96    BUN 17    Creatinine 1.2 (H)    Sodium 139    Potassium 4.3    Chloride 104    CO2 30 (H)    Calcium  9.3    Anion Gap 5.0    GFR 45.5 (L)    AST (SGOT) 29    ALT 24    Alkaline Phosphatase 84    Albumin  4.2    Protein, Total 7.2    Globulin 3.0    Albumin /Globulin Ratio 1.4    Bilirubin, Total 0.2   High Sensitivity Troponin-I    Collection Time: 08/20/24  9:36 AM   Result Value    hs Troponin <2.7   Urinalysis with Reflex to Microscopic Exam and Culture    Collection Time: 08/20/24  9:36 AM    Specimen: Urine, Clean Catch   Result Value    Urine Color Colorless    Urine Clarity Clear    Urine Specific Gravity 1.005    Urine pH 7.0    Urine Leukocyte Esterase Negative    Urine Nitrite Negative    Urine Protein Negative    Urine Glucose Negative    Urine Ketones Negative    Urine Urobilinogen Normal    Urine Bilirubin Negative    Urine Blood Negative   CBC with Differential (Component)    Collection Time: 08/20/24  9:36 AM   Result Value    WBC 5.96    Hemoglobin 11.2 (L)    Hematocrit 35.6    Platelet Count 327    MPV 10.9    RBC 4.09    MCV 87.0    MCH 27.4    MCHC 31.5    RDW 13    nRBC % 0.0    Absolute nRBC 0.00    Preliminary Absolute Neutrophil Count 3.83    Neutrophils % 64.2    Lymphocytes % 25.8    Monocytes % 6.4    Eosinophils % 2.7    Basophils % 0.7    Immature Granulocytes % 0.2    Absolute Neutrophils 3.83    Absolute Lymphocytes 1.54    Absolute Monocytes 0.38    Absolute Eosinophils 0.16    Absolute Basophils 0.04    Absolute  Immature Granulocytes 0.01     US  Venous Duplex Doppler Leg Left   Final Result       No sonographic evidence for left lower extremity deep venous thrombosis.      Ozell DOROTHA Gottron, MD   08/20/2024 12:51 PM      CT Angio Chest (PE or trauma protocol)   Final Result  1.No pulmonary embolism.   2.Coronary artery calcification.   3.Mosaic attenuation which can be seen with small airways disease.      Norleen Lamb, MD   08/20/2024 11:14 AM      Chest AP Portable   Final Result    No active disease is seen in the chest.      Jeana RONAL Sor, MD   08/20/2024 10:21 AM          Attestations   Ernestina Jung, PA-C         [6]   Past Medical History:  Diagnosis Date    Breast cancer (CMS/HCC) 2014    right breast mastectomy    Exercise-induced angina     negative work up    GERD (gastroesophageal reflux disease)     Headache     resolved    Hyperlipidemia     Hypertension     Hypothyroidism     Low back pain     Malignant neoplasm of overlapping sites of right female breast (CMS/HCC) 10/19/2015    Syncope and collapse     Chronic Vertigo    Vertigo    [7]   Past Surgical History:  Procedure Laterality Date    COLONOSCOPY, DIAGNOSTIC (SCREENING)  2021    COLONOSCOPY, REMOVAL OF TUMOR, POLYP, OR OTHER LESION BY SNARE TECHNIQUE N/A 07/21/2020    Procedure: COLONOSCOPY, POLYPECTOMY;  Surgeon: Jerri Nicolas, MD;  Location: [facility];  Service: Gastroenterology;  Laterality: N/A;    ESOPHAGOGASTRODUODENOSCOPY (EGD), BIOPSY N/A 07/21/2020    Procedure: EGD, BIOPSY;  Surgeon: Jerri Nicolas, MD;  Location: [facility];  Service: Gastroenterology;  Laterality: N/A;    EXPLORATORY LAPAROTOMY N/A 05/22/2021    Procedure: EXPLORATORY LAPAROTOMY;  Surgeon: Odean Rollo HERO, MD;  Location: [facility];  Service: General;  Laterality: N/A;    HYSTERECTOMY      LAPAROSCOPY, DIAGNOSTIC N/A 05/22/2021    Procedure: LAPAROSCOPY, DIAGNOSTIC;  Surgeon: Odean Rollo HERO, MD;  Location: [facility];  Service: General;  Laterality: N/A;     LAPAROTOMY, COLECTOMY, RIGHT Right 05/22/2021    Procedure: LAPAROTOMY, COLECTOMY, RIGHT;  Surgeon: Odean Rollo HERO, MD;  Location: [facility];  Service: General;  Laterality: Right;    MASTECTOMY Right 2015   [8]   Social History  Tobacco Use    Smoking status: Never    Smokeless tobacco: Never   Vaping Use    Vaping status: Never Used   Substance Use Topics    Alcohol use: Yes     Comment: wine    Drug use: No   [9]   Family History  Problem Relation Name Age of Onset    Breast cancer Neg Hx     [10]   Allergies  Allergen Reactions    Morphine  Itching    Morphine  And Codeine  Itching and Nausea And Vomiting     dizzy        Jung Ernestina, PA  08/20/24 1303       Mayme Reas, MD  08/23/24 1109

## 2024-08-20 NOTE — Discharge Instructions (Signed)
 Dear Ms. Bergren:    Thank you for choosing the [facility] Emergency Department, the premier emergency department in the [place] area.  I hope your visit today was EXCELLENT. You will receive a survey via text message that will give you the opportunity to provide feedback to your team about your visit. Please do not hesitate to reach out with any questions!    Specific instructions for your visit today:      IF YOU DO NOT CONTINUE TO IMPROVE OR YOUR CONDITION WORSENS, PLEASE CONTACT YOUR DOCTOR OR RETURN IMMEDIATELY TO THE EMERGENCY DEPARTMENT.    Sincerely,  Mayme Reas, MD  Attending Emergency Physician  [facility] Emergency Department      OBTAINING A PRIMARY CARE APPOINTMENT    Primary care physicians (PCPs, also known as primary care doctors) are either internists or family medicine doctors. Both types of PCPs focus on health promotion, disease prevention, patient education and counseling, and treatment of acute and chronic medical conditions.    If you need a primary care doctor, please review the information below to find a primary care location best for you:.     [facility]  Visit below website for list of all [place] [facility] Care Locations  [email or web address]    DOCTOR REFERRALS  Call [phone] (available 24 hours a day, 7 days a week) if you need any further referrals and we can help you find a primary care doctor or specialist.  Referral services are also available online at:  [email or web address] Please reach out to our ED Care Management Specialist, Kacie Oneal, if you encounter any difficulty in scheduling your [facility] follow up appointment.  Kacie Oneal can be reached at [phone] or [email or web address].    YOUR CONTACT INFORMATION  Before leaving please check with registration to make sure we have an up-to-date contact number.  You can call registration at [phone] to update your information.  For  questions about your hospital bill, please call [phone].  For questions about your Emergency Dept Physician bill please call [phone].      FREE HEALTH SERVICES  If you need help with health or social services, please call 2-1-1 for a free referral to resources in your area.  2-1-1 is a free service connecting people with information on health insurance, free clinics, pregnancy, mental health, dental care, food assistance, housing, and substance abuse counseling.  Also, available online at:  http://www.[place].org    ORTHOPEDIC INJURY   Please know that significant injuries can exist even when an initial x-ray is read as normal or negative.  This can occur because some fractures (broken bones) are not initially visible on x-rays.  For this reason, close outpatient follow-up with your primary care doctor or bone specialist (orthopedist) is required.    MEDICATIONS AND FOLLOWUP  Please be aware that some prescription medications can cause drowsiness.  Use caution when driving or operating machinery.    The examination and treatment you have received in our Emergency Department is provided on an emergency basis, and is not intended to be a substitute for your primary care physician.  It is important that your doctor checks you again and that you report any new or remaining problems at that time.      ASSISTANCE WITH INSURANCE    Affordable Care Act  [facility])  Call to start or finish an application, compare plans, enroll or ask a question.  [phone]  TTY: [phone]  Web:  Healthcare.gov    Help Enrolling in [place]  [employer] [place]   725-064-7168 (TOLL-FREE)  (360)124-0137 (TTY)  Web:  Http://www.coverva.org    Local Help Enrolling in the [employer]  [place]  Family Service  6286117387 (MAIN)  Email:  health-help@nvfs .org  Web:  Blackjackmyths.is  Address:  [address] [place], [place] [address]    SEDATING MEDICATIONS  Sedating medications include strong pain medications  (e.g. narcotics), muscle relaxers, benzodiazepines (used for anxiety and as muscle relaxers), Benadryl /diphenhydramine  and other antihistamines for allergic reactions/itching, and other medications.  If you are unsure if you have received a sedating medication, please ask your physician or nurse.  If you received a sedating medication: DO NOT drive a car. DO NOT operate machinery. DO NOT perform jobs where you need to be alert.  DO NOT drink alcoholic beverages while taking this medicine.     If you get dizzy, sit or lie down at the first signs. Be careful going up and down stairs.  Be extra careful to prevent falls.     Never give this medicine to others.     Keep this medicine out of reach of children.     Do not take or save old medicines. Throw them away when outdated.     Keep all medicines in a cool, dry place. DO NOT keep them in your bathroom medicine cabinet or in a cabinet above the stove.    MEDICATION REFILLS  Please be aware that we cannot refill any prescriptions through the ER. If you need further treatment from what is provided at your ER visit, please follow up with your primary care doctor or your pain management specialist.    FREESTANDING EMERGENCY DEPARTMENTS OF [facility]  Did you know [facility] has two freestanding ERs located just a few miles away?  [facility] [place] and [facility] [place] [place] have short wait times, easy free parking directly in front of the building and top patient satisfaction scores - and the same Board Certified Emergency Medicine doctors as [facility].

## 2024-08-20 NOTE — ED Notes (Signed)
 Lab technician Raminda [phone] confirmed Vaginal Bacterial vaginosis and chlamydia trichamatis test swabs.

## 2024-08-20 NOTE — ED Triage Notes (Signed)
 Pt presents to ED with c/o intermittent L hip pain that radiates down her left leg in addition to intermittent chest pain. Denies recent falls or trauma to her hip; no bowel or bladder incontinence reported and no cardiac hx.     A&OX4.

## 2024-08-21 LAB — CHLAMYDIA TRACHOMATIS, NEISSERIA GONORRHEA AND TRICHOMONAS VAGINALIS, PCR
Chlamydia trachomatis DNA: NEGATIVE
Neisseria gonorrhoeae DNA: NEGATIVE
Trichomonas vaginalis DNA: NEGATIVE

## 2024-08-21 LAB — VAGINAL BACTERIAL VAGINOSIS, CANDIDA SPP., AND TRICHOMONAS VAGINALIS, PCR
Bacterial vaginosis marker DNA: NOT DETECTED
Candida glabrata DNA: NOT DETECTED
Candida group DNA: NOT DETECTED
Candida krusei DNA: NOT DETECTED
Trichomonas vaginalis DNA: NOT DETECTED

## 2024-08-22 NOTE — ED Provider Notes (Shared)
 [facility] EMERGENCY DEPARTMENT  ATTENDING PHYSICIAN HISTORY AND PHYSICAL EXAM     Patient Name: Emily [name], Emily [name]  Department:[facility]  Encounter Date:  08/20/2024  Attending Physician: No att. providers found   Age: 78 y.o. female  Patient Room: 054/054  PCP: Jannett Remonia HERO, MD           Diagnosis/Disposition:     Final diagnoses:   Sciatica of left side   Candida vaginitis   Chest pain, unspecified type       ED Disposition       ED Disposition   Discharge    Condition   Stable    Date/Time   Tue Aug 20, 2024  1:01 PM    Comment   Nella Botsford Salada discharge to home/self care.                   Follow-Up Providers (if applicable)    Jannett, Tucker  M, MD  [address]  [place] [address]  [phone]          [facility]  [address]  [place]  [phone]  [phone]        With your cardiologist          [facility]  [address]  [place]  [address]  [phone]           Discharge Medication List as of 08/20/2024  1:07 PM              Medical Decision Making:   {THIS REVIEW SECTION WILL AUTODELETE ONCE NOTE IS SIGNED  Action Links   Snapshot  Orders  Decision Support  Secure Chat  Transfer Documents  Patient Care Timeline  Workup  Dispo  END REVIEW SECTION(Optional):55325}  Initial Differential Diagnosis:  Initial differential diagnosis to include but not limited to: {IIK:38527}    Plan:  ***    Assessment & Plan      Final Impression:  ***  {DISPO NEUPNWD:38529}         Medical Decision Making  Amount and/or Complexity of Data Reviewed  Labs: ordered.  Radiology: ordered.    Risk  Prescription drug management.      {Records Reviewed (internal and external)? (Optional):61520::N/A}  {Was management discussed with a consultant? (Optional):61519::N/A}  {Diagnostic test considered and not performed (Optional):61521::N/A}  {Prescription medications considered and not given  (Optional):61522::N/A}  {Hospitalization considered but not done (Optional):61523}  {Was the decision around the need for surgery discussed with a consultant? (Optional):61527::N/A}  {Social Determinants of Health Considerations (Optional):61524::N/A}  {Was there decision to not resuscitate or to de-escalate care due to poor prognosis? (Optional):61525::N/A}        History of Presenting Illness:   {THIS REVIEW SECTION WILL AUTODELETE ONCE NOTE IS SIGNED  History Review Links   Snapshot  Problem List  Care Everywhere  PMH  PSH  FamHx  SocHx  Allergies  Meds  Immunizations  END REVIEW SECTION(Optional):55325}  Nursing Triage note:    Chief complaint: Hip Pain and Chest Pain    Emily [name] is a 78 y.o. female ***    History of Present Illness      {The patient needed interpreter services to provide history (Optional):29381}      Review of Systems:  Physical Exam:     Review of Systems    Positive and negative ROS per above and in HPI. All other systems reviewed and negative.  Pulse 78  BP 176/79  Resp 18  SpO2 99 %  Temp 98.3 F (36.8 C)     Physical Exam            Interpretations, Clinical Decision Tools and Critical Care:   {THIS REVIEW SECTION WILL AUTODELETE ONCE NOTE IS SIGNED  Interpretation/Data Review Links   Snapshot  Patient Care Timeline  Workup Results Review  EKG  Labs  Imaging  Media  Encounter Orders   END REVIEW SECTION(Optional):55325}  {IHSEDINTERPRETATIONS (Optional):61541}               Procedures:   Procedures

## 2024-08-23 NOTE — ED Provider Notes (Signed)
 Date Time: 08/23/2024 11:09 AM  Patient Name: Emily [name]  Attending Physician: No att. providers found    Attending Note:   I have reviewed and agree with the history. The pertinent physical exam has been documented.  Selected historical findings:  78yo F with PMH of breast cancer in remission, HTN, HLD, GERD presenting with CP, back pain and vaginal itching.  Notes intermittent CP and back pain for months. Notes an exaggeration last night.she has had mild improvement with tylenol . She also notes swelling and calf pain to the left leg. She does note vaginal itching and states she was previously diagnosed with an infection but not put on abx. Denies urinary issues or abnormal vaginal discharge.      Selected physical examination findings: lungs CTAB, heart with RRR. Left calf mildly tender to palpation. On GU exam ingrown hairs noted (pt states she does shave). No abnormal discharge noted. Abd soft and non tender.   Assessment: EKG, labs, UA, CTPE, LLE doppler   Plan: 78yo F with PMH of breast cancer in remission, HTN, HLD, GERD presenting with CP, back pain and vaginal itching. Labs unremarkable. Trop negative UA negative for infection. Imaging negative for acute processes. Will treat empirically for candida vaginitis given previous positive swabs, persistent symptoms and pt stating she was not treated. Pt referred to PCP, gyn and cards for close follow up.     O2 sat-           saturation: 99 %; Oxygen  use: room air; Interpretation: Normal    EKG -             interpreted by me: rate of 53, regular rhythm, Normal axis, NSR. Similar to previous  Monitor -         interpreted by me: sinus bradycardia at 57.          Midlevel Provider Attestation:   The patient was seen and examined by the mid-level (resident, physician assistant or nurse practitioner) and the plan of care was discussed with me. I agree with the plan as it was presented to me.  I have reviewed and agree with the final ED diagnosis.   Please  see the separately documented midlevel note for additional information including but not limited to full history of present illness, review of systems and comprehensive physical exam.              Mayme Reas, MD  08/23/24 1120

## 2024-09-15 ENCOUNTER — Inpatient Hospital Stay
Admission: EM | Admit: 2024-09-15 | Discharge: 2024-09-17 | DRG: 690 | Disposition: A | Discharge status: Home / Self Care

## 2024-09-15 ENCOUNTER — Observation Stay

## 2024-09-15 ENCOUNTER — Emergency Department

## 2024-09-15 DIAGNOSIS — R079 Chest pain, unspecified: Secondary | ICD-10-CM

## 2024-09-15 DIAGNOSIS — I10 Essential (primary) hypertension: Secondary | ICD-10-CM | POA: Diagnosis present

## 2024-09-15 DIAGNOSIS — I2489 Other forms of acute ischemic heart disease: Secondary | ICD-10-CM | POA: Diagnosis present

## 2024-09-15 DIAGNOSIS — Z79890 Hormone replacement therapy (postmenopausal): Secondary | ICD-10-CM

## 2024-09-15 DIAGNOSIS — Z7982 Long term (current) use of aspirin: Secondary | ICD-10-CM

## 2024-09-15 DIAGNOSIS — G44209 Tension-type headache, unspecified, not intractable: Secondary | ICD-10-CM

## 2024-09-15 DIAGNOSIS — I5A Non-ischemic myocardial injury (non-traumatic): Principal | ICD-10-CM | POA: Diagnosis present

## 2024-09-15 DIAGNOSIS — Z79899 Other long term (current) drug therapy: Secondary | ICD-10-CM

## 2024-09-15 DIAGNOSIS — Z853 Personal history of malignant neoplasm of breast: Secondary | ICD-10-CM

## 2024-09-15 DIAGNOSIS — R7989 Other specified abnormal findings of blood chemistry: Principal | ICD-10-CM | POA: Diagnosis present

## 2024-09-15 DIAGNOSIS — R52 Pain, unspecified: Secondary | ICD-10-CM | POA: Diagnosis present

## 2024-09-15 DIAGNOSIS — E782 Mixed hyperlipidemia: Secondary | ICD-10-CM | POA: Diagnosis present

## 2024-09-15 DIAGNOSIS — N390 Urinary tract infection, site not specified: Principal | ICD-10-CM | POA: Diagnosis present

## 2024-09-15 DIAGNOSIS — R519 Headache, unspecified: Secondary | ICD-10-CM | POA: Diagnosis present

## 2024-09-15 DIAGNOSIS — E039 Hypothyroidism, unspecified: Secondary | ICD-10-CM | POA: Diagnosis present

## 2024-09-15 DIAGNOSIS — E119 Type 2 diabetes mellitus without complications: Secondary | ICD-10-CM | POA: Diagnosis present

## 2024-09-15 DIAGNOSIS — R0789 Other chest pain: Secondary | ICD-10-CM | POA: Diagnosis present

## 2024-09-15 LAB — LAB USE ONLY - CBC WITH DIFFERENTIAL
Absolute Basophils: 0.03 x10 3/uL (ref 0.00–0.08)
Absolute Eosinophils: 0.04 x10 3/uL (ref 0.00–0.44)
Absolute Immature Granulocytes: 0.01 x10 3/uL (ref 0.00–0.07)
Absolute Lymphocytes: 1.52 x10 3/uL (ref 0.42–3.22)
Absolute Monocytes: 0.46 x10 3/uL (ref 0.21–0.85)
Absolute Neutrophils: 5.98 x10 3/uL (ref 1.10–6.33)
Absolute nRBC: 0 x10 3/uL (ref <=0.00)
Basophils %: 0.4 %
Eosinophils %: 0.5 %
Hematocrit: 40.6 % (ref 34.7–43.7)
Hemoglobin: 12.3 g/dL (ref 11.4–14.8)
Immature Granulocytes %: 0.1 %
Lymphocytes %: 18.9 %
MCH: 26.6 pg (ref 25.1–33.5)
MCHC: 30.3 g/dL — ABNORMAL LOW (ref 31.5–35.8)
MCV: 87.9 fL (ref 78.0–96.0)
MPV: 11.4 fL (ref 8.9–12.5)
Monocytes %: 5.7 %
Neutrophils %: 74.4 %
Platelet Count: 296 x10 3/uL (ref 142–346)
Preliminary Absolute Neutrophil Count: 5.98 x10 3/uL (ref 1.10–6.33)
RBC: 4.62 x10 6/uL (ref 3.90–5.10)
RDW: 13 % (ref 11–15)
WBC: 8.04 x10 3/uL (ref 3.10–9.50)
nRBC %: 0 /100{WBCs} (ref <=0.0)

## 2024-09-15 LAB — HIGH SENSITIVITY TROPONIN-I WITH DELTA
hs Troponin-I Delta: 14
hs Troponin-I Delta: 32
hs Troponin: 24.3 ng/L — ABNORMAL HIGH (ref <=14.0)
hs Troponin: 56.5 ng/L — ABNORMAL HIGH (ref <=14.0)

## 2024-09-15 LAB — COMPREHENSIVE METABOLIC PANEL
ALT: 11 U/L (ref <=55)
AST (SGOT): 30 U/L (ref <=41)
Albumin/Globulin Ratio: 1.4 (ref 0.9–2.2)
Albumin: 4.2 g/dL (ref 3.5–4.9)
Alkaline Phosphatase: 80 U/L (ref 37–117)
Anion Gap: 13 (ref 5.0–15.0)
BUN: 15 mg/dL (ref 7–21)
Bilirubin, Total: 0.4 mg/dL (ref 0.2–1.2)
CO2: 28 meq/L (ref 17–29)
Calcium: 10.1 mg/dL (ref 7.9–10.2)
Chloride: 101 meq/L (ref 99–111)
Creatinine: 1 mg/dL (ref 0.4–1.0)
GFR: 56.6 mL/min/1.73 m2 — ABNORMAL LOW (ref >=60.0)
Globulin: 3 g/dL (ref 2.0–3.6)
Glucose: 85 mg/dL (ref 70–100)
Potassium: 3.6 meq/L (ref 3.5–5.3)
Protein, Total: 7.2 g/dL (ref 6.0–8.3)
Sodium: 142 meq/L (ref 135–145)

## 2024-09-15 LAB — URINALYSIS WITH REFLEX TO MICROSCOPIC EXAM - REFLEX TO CULTURE
Urine Bilirubin: NEGATIVE
Urine Blood: NEGATIVE
Urine Glucose: NEGATIVE
Urine Nitrite: NEGATIVE
Urine Protein: NEGATIVE
Urine Specific Gravity: 1.004 (ref 1.001–1.035)
Urine Urobilinogen: NORMAL mg/dL (ref 0.2–2.0)
Urine pH: 7 (ref 5.0–8.0)

## 2024-09-15 LAB — COVID-19 (SARS-COV-2) & INFLUENZA  A/B, NAA (ROCHE LIAT)
Influenza A RNA: NOT DETECTED
Influenza B RNA: NOT DETECTED
SARS-CoV-2 (COVID-19) RNA: NOT DETECTED

## 2024-09-15 LAB — WHOLE BLOOD GLUCOSE POCT: Whole Blood Glucose POCT: 84 mg/dL (ref 70–100)

## 2024-09-15 LAB — CREATINE KINASE (CK): Creatine Kinase (CK): 97 U/L (ref 29–233)

## 2024-09-15 LAB — HIGH SENSITIVITY TROPONIN-I: hs Troponin: 10 ng/L (ref <=14.0)

## 2024-09-15 MED ORDER — KETOROLAC TROMETHAMINE 30 MG/ML IJ SOLN
15.0000 mg | Freq: Once | INTRAMUSCULAR | Status: AC
  Administered 2024-09-15: 15 mg via INTRAVENOUS
  Filled 2024-09-15: qty 1

## 2024-09-15 MED ORDER — ONDANSETRON HCL 4 MG/2ML IJ SOLN: 4.0000 mg | Freq: Four times a day (QID) | INTRAMUSCULAR | Status: DC | PRN

## 2024-09-15 MED ORDER — ONDANSETRON 4 MG PO TBDP: 4.0000 mg | ORAL_TABLET | Freq: Four times a day (QID) | ORAL | Status: DC | PRN

## 2024-09-15 MED ORDER — MULTI-VITAMIN/MINERALS PO TABS
1.0000 | ORAL_TABLET | Freq: Every morning | ORAL | Status: DC
  Administered 2024-09-16 – 2024-09-17 (×2): 1 via ORAL
  Filled 2024-09-15 (×2): qty 1

## 2024-09-15 MED ORDER — ANASTROZOLE 1 MG PO TABS
1.0000 mg | ORAL_TABLET | Freq: Every morning | ORAL | Status: DC
  Administered 2024-09-16 – 2024-09-17 (×2): 1 mg via ORAL
  Filled 2024-09-15 (×2): qty 1

## 2024-09-15 MED ORDER — SODIUM CHLORIDE 0.9 % IV SOLN
INTRAVENOUS | Status: AC
  Filled 2024-09-15: qty 1000

## 2024-09-15 MED ORDER — AMLODIPINE BESYLATE 5 MG PO TABS
10.0000 mg | ORAL_TABLET | Freq: Every day | ORAL | Status: DC
  Administered 2024-09-16 – 2024-09-17 (×2): 10 mg via ORAL
  Filled 2024-09-15 (×2): qty 2

## 2024-09-15 MED ORDER — HEPARIN SODIUM (PORCINE) 5000 UNIT/ML IJ SOLN: 3000.0000 [IU] | INTRAMUSCULAR | Status: DC | PRN

## 2024-09-15 MED ORDER — LACTATED RINGERS IV BOLUS
1000.0000 mL | Freq: Once | INTRAVENOUS | Status: AC
  Administered 2024-09-15: 1000 mL via INTRAVENOUS
  Filled 2024-09-15: qty 1000

## 2024-09-15 MED ORDER — DEXTROSE 50 % IV SOLN: 12.5000 g | INTRAVENOUS | Status: DC | PRN

## 2024-09-15 MED ORDER — CARBOXYMETHYLCELLULOSE SOD PF 0.5 % OP SOLN: 1.0000 [drp] | Freq: Three times a day (TID) | OPHTHALMIC | Status: DC | PRN

## 2024-09-15 MED ORDER — BENZONATATE 100 MG PO CAPS: 100.0000 mg | ORAL_CAPSULE | Freq: Three times a day (TID) | ORAL | Status: DC | PRN

## 2024-09-15 MED ORDER — ENOXAPARIN SODIUM 40 MG/0.4ML IJ SOSY: 40.0000 mg | PREFILLED_SYRINGE | Freq: Every day | INTRAMUSCULAR | Status: DC

## 2024-09-15 MED ORDER — ASPIRIN 325 MG PO TABS
325.0000 mg | ORAL_TABLET | Freq: Once | ORAL | Status: AC
  Administered 2024-09-15: 325 mg via ORAL
  Filled 2024-09-15: qty 1

## 2024-09-15 MED ORDER — HEPARIN (PORCINE) IN D5W 50-5 UNIT/ML-% IV SOLN (UNITS/KG/HR ONLY)
12.0000 [IU]/kg/h | INTRAVENOUS | Status: DC
  Administered 2024-09-16: 12 [IU]/kg/h via INTRAVENOUS
  Filled 2024-09-15: qty 500

## 2024-09-15 MED ORDER — NITROGLYCERIN 2 % TD OINT
0.5000 [in_us] | TOPICAL_OINTMENT | Freq: Four times a day (QID) | TRANSDERMAL | Status: AC
  Administered 2024-09-15 – 2024-09-16 (×3): 0.5 [in_us] via TOPICAL
  Filled 2024-09-15 (×3): qty 1

## 2024-09-15 MED ORDER — FAMOTIDINE 20 MG PO TABS
20.0000 mg | ORAL_TABLET | Freq: Two times a day (BID) | ORAL | Status: DC
  Administered 2024-09-15: 20 mg via ORAL
  Filled 2024-09-15: qty 1

## 2024-09-15 MED ORDER — GLUCOSE 40 % PO GEL (WRAP): 15.0000 g | ORAL | Status: DC | PRN

## 2024-09-15 MED ORDER — ACETAMINOPHEN 500 MG PO TABS
500.0000 mg | ORAL_TABLET | ORAL | Status: DC | PRN
  Administered 2024-09-15 – 2024-09-16 (×2): 500 mg via ORAL
  Filled 2024-09-15 (×2): qty 1

## 2024-09-15 MED ORDER — BENZOCAINE-MENTHOL MT LOZG (WRAP): 1.0000 | LOZENGE | OROMUCOSAL | Status: DC | PRN

## 2024-09-15 MED ORDER — DEXTROSE 10 % IV BOLUS: 12.5000 g | INTRAVENOUS | Status: DC | PRN

## 2024-09-15 MED ORDER — ROSUVASTATIN CALCIUM 10 MG PO TABS
40.0000 mg | ORAL_TABLET | Freq: Every day | ORAL | Status: DC
  Administered 2024-09-16 – 2024-09-17 (×2): 40 mg via ORAL
  Filled 2024-09-15 (×2): qty 4

## 2024-09-15 MED ORDER — LEVOTHYROXINE SODIUM 25 MCG PO TABS
25.0000 ug | ORAL_TABLET | Freq: Every day | ORAL | Status: DC
  Administered 2024-09-16 – 2024-09-17 (×2): 25 ug via ORAL
  Filled 2024-09-15 (×2): qty 1

## 2024-09-15 MED ORDER — SALINE SPRAY 0.65 % NA SOLN: 2.0000 | NASAL | Status: DC | PRN

## 2024-09-15 MED ORDER — LISINOPRIL 20 MG PO TABS
40.0000 mg | ORAL_TABLET | Freq: Every day | ORAL | Status: DC
  Administered 2024-09-16 – 2024-09-17 (×2): 40 mg via ORAL
  Filled 2024-09-15: qty 2
  Filled 2024-09-15: qty 4

## 2024-09-15 MED ORDER — GLUCAGON 1 MG IJ SOLR (WRAP): 1.0000 mg | INTRAMUSCULAR | Status: DC | PRN

## 2024-09-15 MED ORDER — MELATONIN 3 MG PO TABS: 3.0000 mg | ORAL_TABLET | Freq: Every evening | ORAL | Status: DC | PRN

## 2024-09-15 MED ORDER — NALOXONE HCL 0.4 MG/ML IJ SOLN (WRAP): 0.2000 mg | INTRAMUSCULAR | Status: DC | PRN

## 2024-09-15 MED ORDER — NITROGLYCERIN 0.4 MG SL SUBL: 0.4000 mg | SUBLINGUAL_TABLET | SUBLINGUAL | Status: DC | PRN

## 2024-09-15 MED ORDER — ASPIRIN 81 MG PO CHEW
81.0000 mg | CHEWABLE_TABLET | Freq: Every day | ORAL | Status: DC
  Administered 2024-09-16 – 2024-09-17 (×2): 81 mg via ORAL
  Filled 2024-09-15 (×2): qty 1

## 2024-09-15 MED ORDER — MECLIZINE HCL 12.5 MG PO TABS: 12.5000 mg | ORAL_TABLET | Freq: Three times a day (TID) | ORAL | Status: DC | PRN

## 2024-09-15 MED ORDER — CEPHALEXIN 250 MG PO CAPS
500.0000 mg | ORAL_CAPSULE | Freq: Once | ORAL | Status: AC
  Administered 2024-09-15: 500 mg via ORAL
  Filled 2024-09-15: qty 2

## 2024-09-15 NOTE — ED Notes (Signed)
 Hospitalist at bedside, seen and examined the patient.

## 2024-09-15 NOTE — H&P (Signed)
 ADMISSION HISTORY AND PHYSICAL EXAM    Date Time: 09/15/2024 11:07 PM  Patient Name: Emily [name]  Attending Physician: Arnold Grass, Emily [name]    Assessment:   78 year old woman with a history of breast cancer status postmastectomy on right presents with 1 day of diffuse bodyaches but also left-sided lower chest pain headache right lower tooth pain weakness and cough found to have rising troponins and EKG ST changes that were nonspecific in lateral leads.     Plan:   1) NSTEMI: Suspect potential ACS because of this patient's older age and female sex, rising troponins and EKG changes.  - Cardiology consult  - Heparin  GTT per cardiology  - ASA continue  - Continue statin  - Serial EKGs  - Telemetry monitoring  -Hold transfer for Cath Lab pending ongoing monitoring of troponin on heparin  drip  - Nitropaste every 6 hours    2) UTI: Patient reports symptoms and has positive UA  - Start Unasyn   - Encourage p.o. fluids  - Follow reflex urine culture    3) tooth pain: Should rule out possible tooth abscess  - Facial x-rays in a.m.  - Possible abscess should be covered with Unasyn     4) HTN:   - monitor BP  - restart home meds in am    5) Hypothyroid:   - cont home synthroid   - check TSH     6) Abdominal tenderness: History of right hemicolectomy  - CT abd/pelvis  - serial exams  - consider GI consult    7) DVT prophylaxis:  - Lovenox     Full code  Dispo: After patient's ACS resolves, assess PT OT and other home needs to determine discharge plan of home versus acute rehab or other facility    I spent 80 minutes including the interview, examination and counseling of the patient as well as in chart review, evaluation and management of the patient's health conditions    Additional Diagnoses:               History of Present Illness:   Emily [name] is a 78 year old woman with a history of breast cancer status postmastectomy on right presents with 1 day of diffuse bodyaches but also left-sided lower chest pain  headache right lower tooth pain weakness and cough.  When the symptoms presented themselves this morning the patient also noticed some dizziness which caused her to fall and then have associated nausea and 1 episode of vomiting.  While she describes the pain as chest pain, she has notable abdominal tenderness and points to an area which is more likely localized in the upper left abdomen rather than the chest.  The patient has had episodes of chest pain before and recently resulting in multiple evaluations and in echo with a normal EF and several normal EKGs.  She describes a sensation in her left chest/upper quadrant as initially pressure which then became more sharp and is increased by a deep breath.  She may have had some subjective fever and chills today as well.      She noticed in the emergency room that when she urinated she had some dysuria.  And generally feels weak.  the tooth pain she notes has been present intermittently for at least a few weeks.  The patient notes that in the emergency department the sensation of her left chest and left upper quadrant pain has not substantially diminished.  She denies shortness of breath.  In the emergency department her initial troponin was normal  but has been rising since presentation.     The patient recently traveled to [place] by car with her son and spent a week they are feeling very well she returned yesterday in her usual state of health before the symptoms came on today while visiting a friend.  The patient lives alone and did not feel that she would be able to take care of herself or seek assistance if needed until presented to the emergency department after consulting with her family.     Past Medical History:   Medical History[1]    Past Surgical History:   Past Surgical History[2]    Family History:   Family History[3]    Social History:   Social History[4]    Allergies:   Allergies[5]    Medications:   Medications Ordered Prior to  Encounter[6]        Review of Systems:   A comprehensive review of systems was: Negative except    General ROS: positive for  - chills, fatigue, fever, and malaise  Cardiovascular ROS: positive for - chest pain  Gastrointestinal ROS: positive for - abdominal pain and nausea/vomiting  Genito-Urinary ROS: positive for - dysuria  Musculoskeletal ROS: positive for - pain in back - left  Neurological ROS: positive for - dizziness, gait disturbance, headaches, and weakness    Physical Exam:     Vitals:    09/15/24 2253   BP: 173/70   Pulse: 60   Resp: 20   Temp: 98.9 F (37.2 C)   SpO2: 98%       Intake and Output Summary (Last 24 hours) at Date Time  No intake or output data in the 24 hours ending 09/15/24 2307    General appearance - anxious, in mild to moderate distress, and ill-appearing  Mental status - alert, oriented to person, place, and time, anxious  Eyes - pupils equal and reactive, extraocular eye movements intact  Chest - clear to auscultation, no wheezes, rales or rhonchi, symmetric air entry  Heart - normal rate, regular rhythm, normal S1, S2, no murmurs, rubs, clicks or gallops  Abdomen - tenderness noted throughout abdomen with some possible guarding, worse in LUQ and LLQ  bowel sounds hypoactive  Neurological - alert, oriented, normal speech, no focal findings or movement disorder noted  Extremities - peripheral pulses normal, no pedal edema, no clubbing or cyanosis  Skin - normal coloration and turgor, no rashes, no suspicious skin lesions noted    Labs:     Results for orders placed or performed during the hospital encounter of 09/15/24 (from the past 24 hours)   Comprehensive Metabolic Panel    Collection Time: 09/15/24  5:00 PM   Result Value    Glucose 85    BUN 15    Creatinine 1.0    Sodium 142    Potassium 3.6    Chloride 101    CO2 28    Calcium  10.1    Anion Gap 13.0    GFR 56.6 (L)    AST (SGOT) 30    ALT 11    Alkaline Phosphatase 80    Albumin  4.2    Protein, Total 7.2    Globulin 3.0     Albumin /Globulin Ratio 1.4    Bilirubin, Total 0.4   COVID-19 (SARS-CoV-2) and Influenza A/B, NAA ([facility])- Admission    Collection Time: 09/15/24  5:00 PM    Specimen: Anterior Nares; Swab   Result Value    SARS-CoV-2 (COVID-19) RNA Not Detected  Influenza A RNA Not Detected    Influenza B RNA Not Detected    Collection Time: 09/15/24  5:00 PM   Result Value    Creatine Kinase (CK) 97   High Sensitivity Troponin-I at 0 hrs    Collection Time: 09/15/24  5:00 PM   Result Value    hs Troponin 10.0   CBC with Differential (Component)    Collection Time: 09/15/24  5:00 PM   Result Value    WBC 8.04    Hemoglobin 12.3    Hematocrit 40.6    Platelet Count 296    MPV 11.4    RBC 4.62    MCV 87.9    MCH 26.6    MCHC 30.3 (L)    RDW 13    nRBC % 0.0    Absolute nRBC 0.00    Preliminary Absolute Neutrophil Count 5.98    Neutrophils % 74.4    Lymphocytes % 18.9    Monocytes % 5.7    Eosinophils % 0.5    Basophils % 0.4    Immature Granulocytes % 0.1    Absolute Neutrophils 5.98    Absolute Lymphocytes 1.52    Absolute Monocytes 0.46    Absolute Eosinophils 0.04    Absolute Basophils 0.03    Absolute Immature Granulocytes 0.01   Urinalysis with Reflex to Microscopic Exam and Culture    Collection Time: 09/15/24  6:35 PM    Specimen: Urine, Clean Catch   Result Value    Urine Color Colorless    Urine Clarity Clear    Urine Specific Gravity 1.004    Urine pH 7.0    Urine Leukocyte Esterase Small (A)    Urine Nitrite Negative    Urine Protein Negative    Urine Glucose Negative    Urine Ketones 10= 1+ (A)    Urine Urobilinogen Normal    Urine Bilirubin Negative    Urine Blood Negative    RBC, UA 0-2    Urine WBC 6-10 (A)    Urine Squamous Epithelial Cells 0-5   High Sensitivity Troponin-I at 2 hrs with calculated Delta    Collection Time: 09/15/24  6:39 PM   Result Value    hs Troponin 24.3 (H)    hs Troponin-I Delta 14   High Sensitivity Troponin-I with Delta    Collection Time: 09/15/24  9:31 PM   Result Value    hs Troponin 56.5  (H)    hs Troponin-I Delta 32           Rads:   Radiological Procedure reviewed.  CT Abdomen Pelvis W WO IV/ W PO Cont  Result Date: 09/16/2024    1. Negative for acute process. 2. Bilateral renal cysts. 3. Gallstones versus sludge. 4. Status post hysterectomy and right colectomy. Emily HILARIO Ruth, Emily [name] 09/16/2024 1:52 AM    Chest AP Portable  Result Date: 09/15/2024  No detectable active cardiopulmonary disease. Bruce A. Enedelia, Emily [name] 09/15/2024 6:22 PM        Signed by: Recardo LITTIE Lex, Emily [name]                   [1]   Past Medical History:  Diagnosis Date    Breast cancer (CMS/HCC) 2014    right breast mastectomy    Exercise-induced angina     negative work up    GERD (gastroesophageal reflux disease)     Headache     resolved    Hyperlipidemia     Hypertension  Hypothyroidism     Low back pain     Malignant neoplasm of overlapping sites of right female breast (CMS/HCC) 10/19/2015    Syncope and collapse     Chronic Vertigo    Vertigo    [2]   Past Surgical History:  Procedure Laterality Date    COLONOSCOPY, DIAGNOSTIC (SCREENING)  2021    COLONOSCOPY, REMOVAL OF TUMOR, POLYP, OR OTHER LESION BY SNARE TECHNIQUE N/A 07/21/2020    Procedure: COLONOSCOPY, POLYPECTOMY;  Surgeon: Jerri Nicolas, Emily [name];  Location: [facility];  Service: Gastroenterology;  Laterality: N/A;    ESOPHAGOGASTRODUODENOSCOPY (EGD), BIOPSY N/A 07/21/2020    Procedure: EGD, BIOPSY;  Surgeon: Jerri Nicolas, Emily [name];  Location: [facility];  Service: Gastroenterology;  Laterality: N/A;    EXPLORATORY LAPAROTOMY N/A 05/22/2021    Procedure: EXPLORATORY LAPAROTOMY;  Surgeon: Odean Rollo HERO, Emily [name];  Location: [facility];  Service: General;  Laterality: N/A;    HYSTERECTOMY      LAPAROSCOPY, DIAGNOSTIC N/A 05/22/2021    Procedure: LAPAROSCOPY, DIAGNOSTIC;  Surgeon: Odean Rollo HERO, Emily [name];  Location: [facility];  Service: General;  Laterality: N/A;    LAPAROTOMY, COLECTOMY, RIGHT Right 05/22/2021    Procedure: LAPAROTOMY, COLECTOMY, RIGHT;  Surgeon: Odean Rollo HERO, Emily [name];  Location: [facility];  Service: General;  Laterality: Right;    MASTECTOMY Right 2015   [3]   Family History  Problem Relation Name Age of Onset    Breast cancer Neg Hx     [4]   Social History  Socioeconomic History    Marital status: Widowed   Tobacco Use    Smoking status: Never    Smokeless tobacco: Never   Vaping Use    Vaping status: Never Used   Substance and Sexual Activity    Alcohol use: Yes     Comment: wine    Drug use: No    Sexual activity: Not Currently     Social Drivers of Health     Financial Resource Strain: Low Risk (02/05/2024)    Overall Financial Resource Strain (CARDIA)     Difficulty of Paying Living Expenses: Not hard at all   Food Insecurity: Food Insecurity Present (09/15/2024)    Hunger Vital Sign     Worried About Running Out of Food in the Last Year: Sometimes true     Ran Out of Food in the Last Year: Sometimes true   Transportation Needs: No Transportation Needs (09/15/2024)    PRAPARE - Therapist, Art (Medical): No     Lack of Transportation (Non-Medical): No   Physical Activity: Inactive (02/06/2024)    Exercise Vital Sign     Days of Exercise per Week: 0 days     Minutes of Exercise per Session: 0 min   Stress: No Stress Concern Present (02/06/2024)    [employer] of Occupational Health - Occupational Stress Questionnaire     Feeling of Stress : Only a little   Social Connections: Moderately Isolated (02/06/2024)    Social Connection and Isolation Panel     Frequency of Communication with Friends and Family: More than three times a week     Frequency of Social Gatherings with Friends and Family: Three times a week     Attends Religious Services: More than 4 times per year     Active Member of Clubs or Organizations: No     Attends Banker Meetings: Never     Marital  Status: Widowed   Intimate Partner Violence: Not At Risk (09/15/2024)    Humiliation, Afraid, Rape, and Kick questionnaire     Fear of Current or  Ex-Partner: No     Emotionally Abused: No     Physically Abused: No     Sexually Abused: No   Housing Stability: Not At Risk (09/15/2024)    Housing Stability NCSS     Do you have housing?: Yes     Are you worried about losing your housing?: No   [5]   Allergies  Allergen Reactions    Morphine  Itching    Morphine  And Codeine  Itching and Nausea And Vomiting     dizzy   [6]   No current facility-administered medications on file prior to encounter.     Current Outpatient Medications on File Prior to Encounter   Medication Sig Dispense Refill    amLODIPine  (NORVASC ) 10 MG tablet Take 1 tablet (10 mg) by mouth once daily (Patient taking differently: Take 1 tablet (10 mg) by mouth once daily) 30 tablet 1    anastrozole  (ARIMIDEX ) 1 MG tablet Take 1 tablet (1 mg total) by mouth every morning 30 tablet 0    aspirin  81 MG chewable tablet Chew 1 tablet (81 mg total) by mouth daily 90 tablet 0    ibandronate (BONIVA) 150 MG tablet Take 1 tablet (150 mg) by mouth every 30 (thirty) days      levothyroxine  (SYNTHROID ) 25 MCG tablet Take 1 tablet (25 mcg) by mouth Once a day at 6:00am      lisinopril  (ZESTRIL ) 40 MG tablet Take 1 tablet (40 mg) by mouth once daily      meclizine  (ANTIVERT ) 12.5 MG tablet Take 1 tablet (12.5 mg) by mouth 3 (three) times daily as needed for Dizziness 30 tablet 0    Multiple Vitamins-Minerals (MULTIVITAMIN WITH MINERALS) tablet Take 1 tablet by mouth once every morning      thiamine  (B-1) 100 MG tablet Take 1 tablet (100 mg) by mouth once daily      pantoprazole  (PROTONIX ) 40 MG tablet Take 1 tablet (40 mg) by mouth every morning before breakfast 30 tablet 0    rosuvastatin  (CRESTOR ) 40 MG tablet Take 1 tablet (40 mg) by mouth once daily 30 tablet 0

## 2024-09-15 NOTE — ED to IP RN Note (Signed)
 [facility]  ED NURSING NOTE FOR THE RECEIVING INPATIENT NURSE   ED NURSE gustav DUTY [phone]   ED CHARGE RN meldalyn   ADMISSION INFORMATION   Emily [name] is a 78 y.o. female admitted with an ED diagnosis of:    1. Elevated troponin         Isolation: None   Allergies: Morphine  and Morphine  and codeine    Holding Orders confirmed? No   Belongings Documented? Yes   Home medications sent to pharmacy confirmed? No   NURSING CARE   Patient Comes From:   Mental Status: Home Independent  alert   ADL: Needs assistance with ADLs   Ambulation: ambulates with: walker   Pertinent Information  and Safety Concerns:     Broset Violence Risk Level: Low having CP, teeth pain, body aches, HA, and feeling weak. She also reports a cough. Patient denies any sick contacts. Did travel recently to [place] 1 week ago. She has not taken anything for pain at home. Patient states she has no SOB. States the CP is generalized. Not worse with breathing. Limb alert at right arm/ post mastectomy     CT / NIH   CT Head ordered on this patient?  No   NIH/Dysphagia assessment done prior to admission? No   VITAL SIGNS (at the time of this note)      Vitals:    09/15/24 2058   BP: 171/68   Pulse: 81   Resp: 17   Temp: 98.8 F (37.1 C)   SpO2: 97%     Pain Score: 7-severe pain (09/15/24 2058)

## 2024-09-15 NOTE — ED Notes (Signed)
 Pt able to ambulate to the bathroom with limping.

## 2024-09-15 NOTE — ED Provider Notes (Signed)
 [facility]  Emergency Department Physician Note      Diagnosis/Disposition     ED Disposition:  Expedited Observation    ED Diagnosis:  Elevated troponin    Discharge Prescription    No medications on file       History of Present Illness     Chief Complaint: Chest Pain and Flu like symptoms       Emily [name] is a 78 y.o. female with no significant prior PMH presenting with [facility].    Patient states that she was feeling last night. Woke up today and did not feel well. She states she is having CP, teeth pain, body aches, HA, and feeling weak. She also reports a cough. Patient denies any sick contacts. Did travel recently to [place] 1 week ago. She has not taken anything for pain at home. Patient states she has no SOB. States the CP is generalized. Not worse with breathing.     Severity of CC: mild  History obtained ab:[id number]  Translator used: No      Review of Systems     ROS:   CONSTITUTIONAL:  no fever  HEENT: cough  EYES: no eye reddness  CARDS: CP  RESP:  normal work of breathing  GI: normal/unchanged bowel habits  GU: normal urineoutput  SKIN: no rash  NEURO: normal weakness/ tone  HEME: no abnormal bruising       Physical Exam     ED Triage Vitals [09/15/24 1653]   Encounter Vitals Group      BP 154/72      Girls Systolic BP Percentile       Girls Diastolic BP Percentile       Boys Systolic BP Percentile       Boys Diastolic BP Percentile       Heart Rate (!) 101      Resp Rate 20      Temp 98.1 F (36.7 C)      Temp src Oral      SpO2 97 %      Weight 68.1 kg      Height       Head Circumference       Peak Flow       Pain Score 9      Pain Loc       Pain Education       Exclude from Growth Chart       Constitutional: No acute distress  HENT:   Head: Normocephalic and atraumatic.   Eyes: Conjunctivae are normal. PERRL. No scleral icterus.   Cardiovascular: Normal rate and regular rhythm. No murmur heard.  Pulmonary: Clear to auscultation b/l. No respiratory distress.   Abdominal:  Soft, non-tender, non-distended  Musculoskeletal: Normal ROM of extremities. No edema.   Neurological: Awake, alert, motor/sensory grossly intact.  Skin: Skin is warm and dry.   Psychiatric: Behavior is normal. Thought content normal.        Medical Decision Making            Emily [name] is a 78 y.o. female presenting with [facility].    Patient with suspected viral URI given cough, body aches and HA. Consideration for CP below    Differential includes    ACS: Features concerning for ACS would include chest pain radiating to the arms, diaphoresis associated with chest pain, vomiting associated with chest pain, and/or chest pain with exertion  -Patient has none of the mentioned concerning features    Aortic  dissection: Features concerning for aortic dissection would include ripping/tearing chest pain, chest pain that is maximal at onset, chest pain with an associated neurological deficit, and a history of connective tissue disease.   -Patient has none of the mentioned concerning features or risk factors    PE: Do not highly suspect this gven grossly unremarkable vitals, symptoms are not constant, and patient does not have significant major risk factors    Pericarditis: Features concerning for pericarditis includes a history of a recent URI with current positional CP classically worse when laying down, CP that is pleuritic, a friction rub on physical exam, or diffuse ST changes on EKG  -Patient has none of the mentioned concerning findings      Will plan on obtaining basic labs, EKG, trop. Will get COVID/flu swab given suspected viral symptoms. Will obtain XR to look for PNA. Fluids and Toradol  for symptomatic support.          Acute illness/injury with risk to life or bodily function (based on differential diagnosis or evaluation)      ED Course as of 09/15/24 2049   Sun Sep 15, 2024   1800 I have independently reviewed the labs: Basic labs unremarkable    [GR]   1853 UA mildly positive. Can treat [GR]   1854  Patient ambulated. Discussed d/c pending repeat trop and patient comfortable with this plan  [GR]   2033 Patient has been signed out to Dr. Arnold from the admitting team for elevated trop      [GR]      ED Course User Index  [GR] Garon Eden, MD           Evaluation    External Records Reviewed: Unable to identify pertinent records  Date of Records reviewed: N/A      Vital Signs: Reviewed the patients vital signs.   Nursing Notes: Reviewed and utilized available nursing notes.    CARDIAC STUDIES    The following cardiac studies were independently interpreted by myself    EKG Interpretation:  Date of EKG: 09/15/24  Time interrupted 502  STEMI vs No STEMI: No STEMI  Rate: 96  Rhythm: Sinus  Intervals: Narrow  Blocks: No blocks  PROCEDURES    Procedures    EMERGENCY IMAGING STUDIES    I have personally read and interpreted the CXR: I do not see any evidence of consolidation or pulmonary edema        RADIOLOGY IMAGING STUDIES      Chest AP Portable   Final Result      No detectable active cardiopulmonary disease.      Bruce A. Enedelia, MD   09/15/2024 6:22 PM          EMERGENCY DEPT. MEDICATIONS      ED Medication Orders (From admission, onward)      Start Ordered     Status Ordering Provider    09/15/24 2036 09/15/24 2035  aspirin  tablet 325 mg  Once        Route: Oral  Ordered Dose: 325 mg       Ordered Tanasia Budzinski    09/15/24 1854 09/15/24 1853  cephALEXin  (KEFLEX ) capsule 500 mg  Once        Route: Oral  Ordered Dose: 500 mg       Last MAR action: Given Jacinda Kanady    09/15/24 1700 09/15/24 1659  lactated ringers  bolus 1,000 mL  Once        Route: Intravenous  Ordered Dose:  1,000 mL       Last MAR action: New Bag [facility], Cerita Rabelo    09/15/24 1700 09/15/24 1659  ketorolac  (TORADOL ) injection 15 mg  Once        Route: Intravenous  Ordered Dose: 15 mg       Last MAR action: Given Francisco Eyerly            LABORATORY RESULTS    Ordered and independently interpreted AVAILABLE laboratory tests.   Results for orders  placed or performed during the hospital encounter of 09/15/24   COVID-19 (SARS-CoV-2) and Influenza A/B, NAA ([facility])- Admission    Collection Time: 09/15/24  5:00 PM    Specimen: Anterior Nares; Swab   Result Value Ref Range    SARS-CoV-2 (COVID-19) RNA Not Detected Not Detected    Influenza A RNA Not Detected Not Detected    Influenza B RNA Not Detected Not Detected   Comprehensive Metabolic Panel    Collection Time: 09/15/24  5:00 PM   Result Value Ref Range    Glucose 85 70 - 100 mg/dL    BUN 15 7 - 21 mg/dL    Creatinine 1.0 0.4 - 1.0 mg/dL    Sodium 857 864 - 854 mEq/L    Potassium 3.6 3.5 - 5.3 mEq/L    Chloride 101 99 - 111 mEq/L    CO2 28 17 - 29 mEq/L    Calcium  10.1 7.9 - 10.2 mg/dL    Anion Gap 86.9 5.0 - 15.0    GFR 56.6 (L) >=60.0 mL/min/1.73 m2    AST (SGOT) 30 <=41 U/L    ALT 11 <=55 U/L    Alkaline Phosphatase 80 37 - 117 U/L    Albumin  4.2 3.5 - 4.9 g/dL    Protein, Total 7.2 6.0 - 8.3 g/dL    Globulin 3.0 2.0 - 3.6 g/dL    Albumin /Globulin Ratio 1.4 0.9 - 2.2    Bilirubin, Total 0.4 0.2 - 1.2 mg/dL   Creatine Kinase (CK)    Collection Time: 09/15/24  5:00 PM   Result Value Ref Range    Creatine Kinase (CK) 97 29 - 233 U/L   High Sensitivity Troponin-I at 0 hrs    Collection Time: 09/15/24  5:00 PM   Result Value Ref Range    hs Troponin 10.0 <=14.0 ng/L   CBC with Differential (Component)    Collection Time: 09/15/24  5:00 PM   Result Value Ref Range    WBC 8.04 3.10 - 9.50 x10 3/uL    Hemoglobin 12.3 11.4 - 14.8 g/dL    Hematocrit 59.3 65.2 - 43.7 %    Platelet Count 296 142 - 346 x10 3/uL    MPV 11.4 8.9 - 12.5 fL    RBC 4.62 3.90 - 5.10 x10 6/uL    MCV 87.9 78.0 - 96.0 fL    MCH 26.6 25.1 - 33.5 pg    MCHC 30.3 (L) 31.5 - 35.8 g/dL    RDW 13 11 - 15 %    nRBC % 0.0 <=0.0 /100 WBC    Absolute nRBC 0.00 <=0.00 x10 3/uL    Preliminary Absolute Neutrophil Count 5.98 1.10 - 6.33 x10 3/uL    Neutrophils % 74.4 Not Established %    Lymphocytes % 18.9 Not Established %    Monocytes % 5.7 Not Established  %    Eosinophils % 0.5 Not Established %    Basophils % 0.4 Not Established %    Immature Granulocytes % 0.1  Not Established %    Absolute Neutrophils 5.98 1.10 - 6.33 x10 3/uL    Absolute Lymphocytes 1.52 0.42 - 3.22 x10 3/uL    Absolute Monocytes 0.46 0.21 - 0.85 x10 3/uL    Absolute Eosinophils 0.04 0.00 - 0.44 x10 3/uL    Absolute Basophils 0.03 0.00 - 0.08 x10 3/uL    Absolute Immature Granulocytes 0.01 0.00 - 0.07 x10 3/uL   ECG 12 Lead    Collection Time: 09/15/24  5:00 PM   Result Value Ref Range    Ventricular Rate 96 BPM    Atrial Rate 96 BPM    P-R Interval 192 ms    QRS Duration 96 ms    Q-T Interval 372 ms    QTC Calculation (Bezet) 469 ms    P Axis 52 degrees    R Axis 52 degrees    T Axis -70 degrees    IHS MUSE NARRATIVE AND IMPRESSION       NORMAL SINUS RHYTHM  MINIMAL VOLTAGE CRITERIA FOR LVH, MAY BE NORMAL VARIANT  ST & T WAVE ABNORMALITY, CONSIDER INFERIOR ISCHEMIA  ABNORMAL ECG  WHEN COMPARED WITH ECG OF 20-Aug-2024 09:20,  VENTRICULAR RATE HAS INCREASED by  43 bpm  NON-SPECIFIC CHANGE IN ST SEGMENT IN INFERIOR LEADS  NONSPECIFIC T WAVE ABNORMALITY, WORSE IN INFERIOR LEADS  NONSPECIFIC T WAVE ABNORMALITY NOW EVIDENT IN LATERAL LEADS  QT HAS LENGTHENED     Urinalysis with Reflex to Microscopic Exam and Culture    Collection Time: 09/15/24  6:35 PM    Specimen: Urine, Clean Catch   Result Value Ref Range    Urine Color Colorless Colorless, Straw or Yellow    Urine Clarity Clear Clear, Hazy    Urine Specific Gravity 1.004 1.001 - 1.035    Urine pH 7.0 5.0 - 8.0    Urine Leukocyte Esterase Small (A) Negative    Urine Nitrite Negative Negative    Urine Protein Negative Negative    Urine Glucose Negative Negative    Urine Ketones 10= 1+ (A) Negative mg/dL    Urine Urobilinogen Normal 0.2 - 2.0 mg/dL    Urine Bilirubin Negative Negative    Urine Blood Negative Negative    RBC, UA 0-2 0 - 5 /hpf    Urine WBC 6-10 (A) 0 - 5 /hpf    Urine Squamous Epithelial Cells 0-5 0 - 25 /hpf   High Sensitivity  Troponin-I at 2 hrs with calculated Delta    Collection Time: 09/15/24  6:39 PM   Result Value Ref Range    hs Troponin 24.3 (H) <=14.0 ng/L    hs Troponin-I Delta 14      *Note: Due to a large number of results and/or encounters for the requested time period, some results have not been displayed. A complete set of results can be found in Results Review.          Supplemental Encounter Data   Medical History[1]  Past Surgical History[2]  Social History[3]  Family History[4]  Allergies[5]    Encounter Orders:  Orders Placed This Encounter   Procedures    COVID-19 (SARS-CoV-2) and Influenza A/B, NAA ([facility])- Admission    Chest AP Portable    CBC with Differential (Order)    Comprehensive Metabolic Panel    Creatine Kinase (CK)    High Sensitivity Troponin-I at 0 hrs    High Sensitivity Troponin-I at 2 hrs with calculated Delta    CBC with Differential (Component)    Urinalysis with  Reflex to Microscopic Exam and Culture    Urine Elnor Culture Hold Tube    Consult to Cardiology    ECG 12 Lead    Admit to Expedited Observation     Medications Administered:  Medications   aspirin  tablet 325 mg (has no administration in time range)   lactated ringers  bolus 1,000 mL (1,000 mLs Intravenous New Bag 09/15/24 1725)   ketorolac  (TORADOL ) injection 15 mg (15 mg Intravenous Given 09/15/24 1725)   cephALEXin  (KEFLEX ) capsule 500 mg (500 mg Oral Given 09/15/24 2012)     Laboratory and Imaging Studies:  Results for orders placed or performed during the hospital encounter of 09/15/24 (from the past 24 hours)   Comprehensive Metabolic Panel    Collection Time: 09/15/24  5:00 PM   Result Value    Glucose 85    BUN 15    Creatinine 1.0    Sodium 142    Potassium 3.6    Chloride 101    CO2 28    Calcium  10.1    Anion Gap 13.0    GFR 56.6 (L)    AST (SGOT) 30    ALT 11    Alkaline Phosphatase 80    Albumin  4.2    Protein, Total 7.2    Globulin 3.0    Albumin /Globulin Ratio 1.4    Bilirubin, Total 0.4   COVID-19 (SARS-CoV-2) and  Influenza A/B, NAA ([facility])- Admission    Collection Time: 09/15/24  5:00 PM    Specimen: Anterior Nares; Swab   Result Value    SARS-CoV-2 (COVID-19) RNA Not Detected    Influenza A RNA Not Detected    Influenza B RNA Not Detected    Collection Time: 09/15/24  5:00 PM   Result Value    Creatine Kinase (CK) 97   High Sensitivity Troponin-I at 0 hrs    Collection Time: 09/15/24  5:00 PM   Result Value    hs Troponin 10.0   CBC with Differential (Component)    Collection Time: 09/15/24  5:00 PM   Result Value    WBC 8.04    Hemoglobin 12.3    Hematocrit 40.6    Platelet Count 296    MPV 11.4    RBC 4.62    MCV 87.9    MCH 26.6    MCHC 30.3 (L)    RDW 13    nRBC % 0.0    Absolute nRBC 0.00    Preliminary Absolute Neutrophil Count 5.98    Neutrophils % 74.4    Lymphocytes % 18.9    Monocytes % 5.7    Eosinophils % 0.5    Basophils % 0.4    Immature Granulocytes % 0.1    Absolute Neutrophils 5.98    Absolute Lymphocytes 1.52    Absolute Monocytes 0.46    Absolute Eosinophils 0.04    Absolute Basophils 0.03    Absolute Immature Granulocytes 0.01   Urinalysis with Reflex to Microscopic Exam and Culture    Collection Time: 09/15/24  6:35 PM    Specimen: Urine, Clean Catch   Result Value    Urine Color Colorless    Urine Clarity Clear    Urine Specific Gravity 1.004    Urine pH 7.0    Urine Leukocyte Esterase Small (A)    Urine Nitrite Negative    Urine Protein Negative    Urine Glucose Negative    Urine Ketones 10= 1+ (A)    Urine Urobilinogen Normal    Urine  Bilirubin Negative    Urine Blood Negative    RBC, UA 0-2    Urine WBC 6-10 (A)    Urine Squamous Epithelial Cells 0-5   High Sensitivity Troponin-I at 2 hrs with calculated Delta    Collection Time: 09/15/24  6:39 PM   Result Value    hs Troponin 24.3 (H)    hs Troponin-I Delta 14     Chest AP Portable   Final Result      No detectable active cardiopulmonary disease.      Bruce A. Enedelia, MD   09/15/2024 6:22 PM                      [1]   Past Medical History:  Diagnosis  Date    Breast cancer (CMS/HCC) 2014    right breast mastectomy    Exercise-induced angina     negative work up    GERD (gastroesophageal reflux disease)     Headache     resolved    Hyperlipidemia     Hypertension     Hypothyroidism     Low back pain     Malignant neoplasm of overlapping sites of right female breast (CMS/HCC) 10/19/2015    Syncope and collapse     Chronic Vertigo    Vertigo    [2]   Past Surgical History:  Procedure Laterality Date    COLONOSCOPY, DIAGNOSTIC (SCREENING)  2021    COLONOSCOPY, REMOVAL OF TUMOR, POLYP, OR OTHER LESION BY SNARE TECHNIQUE N/A 07/21/2020    Procedure: COLONOSCOPY, POLYPECTOMY;  Surgeon: Jerri Nicolas, MD;  Location: [facility];  Service: Gastroenterology;  Laterality: N/A;    ESOPHAGOGASTRODUODENOSCOPY (EGD), BIOPSY N/A 07/21/2020    Procedure: EGD, BIOPSY;  Surgeon: Jerri Nicolas, MD;  Location: [facility];  Service: Gastroenterology;  Laterality: N/A;    EXPLORATORY LAPAROTOMY N/A 05/22/2021    Procedure: EXPLORATORY LAPAROTOMY;  Surgeon: Odean Rollo HERO, MD;  Location: [facility];  Service: General;  Laterality: N/A;    HYSTERECTOMY      LAPAROSCOPY, DIAGNOSTIC N/A 05/22/2021    Procedure: LAPAROSCOPY, DIAGNOSTIC;  Surgeon: Odean Rollo HERO, MD;  Location: [facility];  Service: General;  Laterality: N/A;    LAPAROTOMY, COLECTOMY, RIGHT Right 05/22/2021    Procedure: LAPAROTOMY, COLECTOMY, RIGHT;  Surgeon: Odean Rollo HERO, MD;  Location: [facility];  Service: General;  Laterality: Right;    MASTECTOMY Right 2015   [3]   Social History  Tobacco Use    Smoking status: Never    Smokeless tobacco: Never   Vaping Use    Vaping status: Never Used   Substance Use Topics    Alcohol use: Yes     Comment: wine    Drug use: No   [4]   Family History  Problem Relation Name Age of Onset    Breast cancer Neg Hx     [5]   Allergies  Allergen Reactions    Morphine  Itching    Morphine  And Codeine  Itching and Nausea And Vomiting     dizzy        Garon Eden,  MD  09/15/24 2049

## 2024-09-15 NOTE — ED Notes (Signed)
Bed: E20  Expected date:   Expected time:   Means of arrival:   Comments:  Medic 409

## 2024-09-15 NOTE — ED Notes (Addendum)
 Pt BIBA to ED from friend's house. Pt c/o chest pain, generalized body aches and flu-like symptoms. PMHx of HTN and irregular heart beat. Pt is A&O; afebrile and on RA. Respirations equals and even. NAD.        BP 154/72   Pulse (!) 101   Temp 98.1 F (36.7 C) (Oral)   Resp 20   Wt 68.1 kg   SpO2 97%   BMI 25.77 kg/m

## 2024-09-15 NOTE — EDIE (Signed)
 [facility] NOTIFICATION 09/15/2024 16:46 Emily [name], Emily [name] DOB: 08/27/1946 MRN: [id number]    [place] [facility]'s patient encounter information:   FMW:?[id number]  Account [id number]  Billing Account [id number]      Criteria Met      5 ED Visits in 12 Months    Security and Safety  No Security Events were found.  ED Care Guidelines  There are currently no ED Care Guidelines for this patient. Please check your facility's medical records system.        Prescription Monitoring Program  000 ??- Narcotic Use Score   000 ??- Sedative Use Score   000 ??- Stimulant Use Score   000??- Overdose Risk Score  - All Scores range from 000-999 with 75% of the population scoring < 200 and only 1% scoring above 650  - The last digit of the narcotic, sedative, and stimulant score indicates the number of active prescriptions of that type  - Higher Use scores correlate with increased prescribers, pharmacies, mg equiv, and overlapping prescriptions   - Higher Overdose Risk Scores correlate with increased risk of unintentional overdose death   Concerning or unexpectedly high scores should prompt a review of the PMP record; this does not constitute checking PMP for prescribing purposes.    E.D. Visit Count (12 mo.)  Facility Visits   [place] [facility] 9   [facility] 1   Total 10   Note: Visits indicate total known visits.     Recent Emergency Department Visit Summary  Date Facility [facility] Type Diagnoses or Chief Complaint    Sep 15, 2024  [place] [facility]  [place]  [place]  Emergency      flu-like, chest pain      Aug 20, 2024  [facility]  [place]  [place]  Emergency      Chest pain, unspecified      Acute candidiasis of vulva and vagina      Sciatica, left side      Hip Pain      Back Pain      Chest Pain      Triage      Aug 11, 2024  [place] [facility]  [place]  [place]  Emergency      Chest pain, unspecified      Pain, unspecified      Leg Pain      Dizziness      Emesis       Constipation; Leg Pain      Jun 07, 2024  [place] [facility] [place] [facility]  [place]  [place]  Emergency      Acute kidney failure, unspecified      Dizziness and giddiness      Foreign Body in Vagina      fall      Jun 07, 2024  [place] [facility] [place] [facility]  [place]  [place]  Emergency      Essential (primary) hypertension      Mixed hyperlipidemia      Calculus of gallbladder without cholecystitis without obstruction      Dizziness and giddiness      Acute kidney failure, unspecified      May 28, 2024  [place] [facility] [facility]  [place]  [place]  Emergency      Anesthesia of skin      Dizziness      Generalized weakness      Dizziness,Weakness  Feb 04, 2024  [place] [facility] [place] [facility]  [place]  [place]  Emergency      Hypertensive emergency      Dizziness      Hypertension      Nausea      Nausea/Vomitting      Dec 23, 2023  [place] [facility] [place] [facility]  [place]  [place]  Emergency      Other specified noninflammatory disorders of vagina      Unspecified fall, initial encounter      Lumbago with sciatica, right side      Other chronic pain      Urinary Tract Infection Symptoms      Fall      medic      Nov 25, 2023  [place] [facility] [place] [facility]  [place]  [place]  Emergency      Chest pain, unspecified      Dizziness and giddiness      Vascular complications following infusion, transfusion and therapeutic injection, initial encounter      Headache      Dizziness      BODY PAIN      Oct 19, 2023  [place] [facility]  [place]  [place]  Emergency      Low back pain, unspecified      Pain in right leg      Pain in right shoulder      Other chest pain      Unspecified fall due to ice and snow, initial encounter      Fall        Recent Inpatient Visit Summary  Date Facility [facility] Type Diagnoses or Chief Complaint    Feb 04, 2024   [facility]  [place]  [place]  Medical Surgical      Mixed hyperlipidemia      Essential (primary) hypertension      Hypertensive emergency        Care Team  Provider Specialty Phone Fax Service Dates   [place], [facility] , DO Internal Medicine   Current     DAVIDSON, ARLEY HERO MD M, MD Family Medicine: Adult Medicine [phone] Current    [place], [place]  M., M.D. Internal Medicine   Current    SHAWNEE MERL FALCON, M.D. Internal Medicine   Current    JAKIE RAISIN, NP Nurse Practitioner: Adult Health [phone]  Current    JACKLYN MERRIANNE GOODNIGHT Nurse Practitioner: Gerontology [phone] Current      [facility]  This patient has registered at the [facility] Emergency Department  For more information visit: [email or web address]    [email or web address] [email or web address]     PLEASE NOTE:     1.   Any care recommendations and other clinical information are provided as guidelines or for historical purposes only, and providers should exercise their own clinical judgment when providing care.    2.   You may only use this information for purposes of treatment, payment or health care operations activities, and subject to the limitations of applicable [facility] Policies.    3.   You should consult directly with the organization that provided a care guideline or other clinical history with any questions about additional information or accuracy or completeness of information provided.    ? 2025 [facility] - [email or web address]

## 2024-09-16 ENCOUNTER — Observation Stay

## 2024-09-16 DIAGNOSIS — I249 Acute ischemic heart disease, unspecified: Secondary | ICD-10-CM

## 2024-09-16 LAB — ECHO ADULT TTE COMPLETE
AV Area (Cont Eq VTI): 1.8014
AV Mean Gradient: 5
AV Peak Velocity: 1.46
Ao Root Diameter (2D): 1.6
BP Mod LV Ejection Fraction: 73
IVS Diastolic Thickness (2D): 0.993
LA Dimension (2D): 3
LA Volume Index (BP A-L): 34.0291
LVID diastole (2D): 3.48
LVID systole (2D): 2.2
MV E/A: 0.6887
MV E/e' (Average): 12.9851
MV Mean Gradient: 3
Mitral Valve Findings: NORMAL
Prox Ascending Aorta Diameter: 3.2
Pulmonary Valve Findings: NORMAL
RV Basal Diastolic Dimension: 2.81
RV Systolic Pressure: 42.4384
TAPSE: 2.28
Tricuspid Valve Findings: NORMAL

## 2024-09-16 LAB — BASIC METABOLIC PANEL
Anion Gap: 10 (ref 5.0–15.0)
Anion Gap: 11 (ref 5.0–15.0)
BUN: 10 mg/dL (ref 7–21)
BUN: 13 mg/dL (ref 7–21)
CO2: 27 meq/L (ref 17–29)
CO2: 28 meq/L (ref 17–29)
Calcium: 9.4 mg/dL (ref 7.9–10.2)
Calcium: 9.4 mg/dL (ref 7.9–10.2)
Chloride: 104 meq/L (ref 99–111)
Chloride: 108 meq/L (ref 99–111)
Creatinine: 1 mg/dL (ref 0.4–1.0)
Creatinine: 1 mg/dL (ref 0.4–1.0)
GFR: 56.6 mL/min/1.73 m2 — ABNORMAL LOW (ref >=60.0)
GFR: 56.6 mL/min/1.73 m2 — ABNORMAL LOW (ref >=60.0)
Glucose: 79 mg/dL (ref 70–100)
Glucose: 91 mg/dL (ref 70–100)
Potassium: 3.6 meq/L (ref 3.5–5.3)
Potassium: 4.5 meq/L (ref 3.5–5.3)
Sodium: 142 meq/L (ref 135–145)
Sodium: 146 meq/L — ABNORMAL HIGH (ref 135–145)

## 2024-09-16 LAB — HIGH SENSITIVITY TROPONIN-I WITH DELTA
hs Troponin-I Delta: 5
hs Troponin-I Delta: 1
hs Troponin: 61.1 ng/L — ABNORMAL HIGH (ref <=14.0)
hs Troponin: 59.7 ng/L — ABNORMAL HIGH (ref <=14.0)

## 2024-09-16 LAB — APTT
PTT: 122 s — ABNORMAL HIGH (ref 27–39)
PTT: 42 s — ABNORMAL HIGH (ref 27–39)
PTT: 43 s — ABNORMAL HIGH (ref 27–39)
PTT: 73 s — ABNORMAL HIGH (ref 27–39)
PTT: >200 s (ref 27–39)

## 2024-09-16 LAB — PT/INR
INR: 1.6 — ABNORMAL HIGH (ref 0.9–1.1)
PT: 17.6 s — ABNORMAL HIGH (ref 10.1–12.9)

## 2024-09-16 LAB — WHOLE BLOOD GLUCOSE POCT: Whole Blood Glucose POCT: 105 mg/dL — ABNORMAL HIGH (ref 70–100)

## 2024-09-16 LAB — LAB USE ONLY - URINE GRAY CULTURE HOLD TUBE

## 2024-09-16 LAB — ANTI-XA,UFH
Anti-Xa, UFH: >2 [IU]/mL
Anti-Xa, UFH: >2 [IU]/mL

## 2024-09-16 MED ORDER — HYDRALAZINE HCL 25 MG PO TABS
25.0000 mg | ORAL_TABLET | Freq: Three times a day (TID) | ORAL | Status: DC | PRN
  Filled 2024-09-16: qty 1

## 2024-09-16 MED ORDER — SODIUM CHLORIDE 0.9 % IV MBP: 3.0000 g | Freq: Four times a day (QID) | INTRAVENOUS | Status: DC

## 2024-09-16 MED ORDER — IOHEXOL 12 MG/ML PO SOLN
1000.0000 mL | Freq: Once | ORAL | Status: AC | PRN
  Administered 2024-09-16: 1000 mL via ORAL
  Filled 2024-09-16: qty 1000

## 2024-09-16 MED ORDER — SODIUM CHLORIDE 0.9 % IV MBP
3.0000 g | Freq: Three times a day (TID) | INTRAVENOUS | Status: DC
  Administered 2024-09-16 (×3): 3 g via INTRAVENOUS
  Filled 2024-09-16 (×3): qty 8

## 2024-09-16 MED ORDER — FAMOTIDINE 20 MG PO TABS
20.0000 mg | ORAL_TABLET | Freq: Every day | ORAL | Status: DC
  Administered 2024-09-16: 20 mg via ORAL
  Filled 2024-09-16: qty 1

## 2024-09-16 MED ORDER — SODIUM CHLORIDE 0.9 % IV SOLN
INTRAVENOUS | Status: AC
  Filled 2024-09-16: qty 1000

## 2024-09-16 MED ORDER — METOPROLOL TARTRATE 50 MG PO TABS
0.0000 mg | ORAL_TABLET | Freq: Once | ORAL | Status: DC | PRN
  Filled 2024-09-16: qty 4

## 2024-09-16 MED ORDER — HEPARIN (PORCINE) IN D5W 50-5 UNIT/ML-% IV SOLN (UNITS/KG/HR ONLY): 12.0000 [IU]/kg/h | INTRAVENOUS | Status: DC

## 2024-09-16 MED ORDER — HEPARIN SODIUM (PORCINE) 5000 UNIT/ML IJ SOLN: 3000.0000 [IU] | INTRAMUSCULAR | Status: DC | PRN

## 2024-09-16 MED ORDER — IOHEXOL 350 MG/ML IV SOLN
100.0000 mL | Freq: Once | INTRAVENOUS | Status: AC | PRN
  Administered 2024-09-16: 100 mL via INTRAVENOUS
  Filled 2024-09-16: qty 100

## 2024-09-16 MED ORDER — SULFAMETHOXAZOLE-TRIMETHOPRIM 800-160 MG PO TABS
1.0000 | ORAL_TABLET | Freq: Two times a day (BID) | ORAL | Status: DC
  Administered 2024-09-16 – 2024-09-17 (×2): 1 via ORAL
  Filled 2024-09-16 (×2): qty 1

## 2024-09-16 MED ORDER — NITROGLYCERIN 0.4 MG SL SUBL: 0.4000 mg | SUBLINGUAL_TABLET | Freq: Once | SUBLINGUAL | Status: DC | PRN

## 2024-09-16 NOTE — Progress Notes (Signed)
OBS GIVEN

## 2024-09-16 NOTE — Plan of Care (Signed)
 Patient is off unit for heart ultrasound

## 2024-09-16 NOTE — Plan of Care (Signed)
 APTT 42. Heparin  restarted rate changed per Nomogram.

## 2024-09-16 NOTE — Progress Notes (Addendum)
 Tim, Pharmacist informed of critical anti Xa result, which was > 2 at about 0008 am through telephone call.

## 2024-09-16 NOTE — Nursing Progress Note (Signed)
 4 eyes in 4 hours pressure injury assessment note:      Completed with: Alex CT  Unit & Time admitted: 6B             Bony Prominences: Check appropriate box; if Pressure Injury is present enter Pressure Injury assessment in LDA    Occiput:                    []  Pressure Injury present  Face:                        []  Pressure Injury present  Ears:                         []  Pressure Injury present  Spine:                       []  Pressure Injury present  Shoulders:                []  Pressure Injury present  Elbows:                     []  Pressure Injury present  Sacrum/coccyx:        []  Pressure Injury present  Ischial Tuberosity:    []  Pressure Injury present  Trochanter/Hip:        []  Pressure Injury present  Knees:                      []  Pressure Injury present  Ankles:                     []  Pressure Injury present  Heels:                       []  Pressure Injury present  Other pressure areas: []  Pressure Injury location       Device related: []  Device name:         LDA completed if pressure injury present: yes/no  Consult WOCN if necessary    Other skin related issues, ie tears, rash, etc, document in Integumentary flowsheet

## 2024-09-16 NOTE — Plan of Care (Signed)
 APTT greater than 200. PA notified.

## 2024-09-16 NOTE — Plan of Care (Signed)
 Report given to receiving nurse M. Heparin  handoff at bedside. Patient stated had all personal belongings intact and correct. Patient stated already notified family of transfer.

## 2024-09-16 NOTE — Progress Notes (Signed)
 Emily [name], Pharmacist informed of critical anti Xa result, which was >2.0 with APTT = 73 at about 4:47 am. However, per Pharmacist and heparin  nomogram, no rate changed since it is less than six (6) hours when the heparin  drip was started. Consulting Civil Engineer notified. APTT scheduled to be drawn at 8:00 am to monitor trend of APTT per Pharmacist & Provider.

## 2024-09-16 NOTE — Progress Notes (Signed)
 [facility]  Internal Medicine Hospitalists  Progress Note        Assessment / Plan:        Emily [name] is a 78 y.o. female with PMHx of breast cancer s/p right mastectomy admitted w/ chest pain, palpitations and not feeling well per patient.   Troponins noted to be elevated overnight and trended flat, chest pain is better this am, patient was started on heparin  drip overnight.       #Elevated troponin, trended flat likely due to demand ischemia  #Chest pain  Cardiology input appreciated, recommends to continue the heparin  drip for now, aspirin   2D echocardiogram  Coronary CTA tomorrow    #Hypertension  On norvasc  and lisinopril , monitor     #Hyperlipidemia  On crestor      #hypothyroidism   On synthroid      #History of breast cancer    POC d/w patient, cardiology team, nurse, and Dr. Dennise.            VTE Prophylaxis: heparin  25,000 units in dextrose  5% 500 mL infusion (premix)  Place sequential compression device  heparin  (porcine) injection 3,000 Units  Place sequential compression device     Foley Catheter: No Foley Present    Venous Access: No Temporary Central Line Present    Medical Readiness for Discharge: Anticipated Tomorrow    Open Handoff Activity in Sidebar    Additional Diagnoses:                      Medications and Allergies:   Current Facility-Administered Medications[1]  Infusion Meds[2]  PRN Medications[3]  Allergies[4]    Subjective:         Patient seen and examined, reports of feeling better          Objective:      Temp:  [97.5 F (36.4 C)-98.9 F (37.2 C)] 97.5 F (36.4 C)  Heart Rate:  [51-96] 75  Resp Rate:  [16-22] 17  BP: (141-184)/(60-76) 155/61   General: awake, alert, oriented x 3; no acute distress  HEENT: anicteric sclerae, PERRL, EOMI; MMM  Cardiovascular: regular rate and rhythm; no murmurs, rubs, or gallops  Lungs: clear to auscultation bilaterally without wheezing, rhonchi, or rales  Abdomen: soft, non-distended; non-tender to palpation, no rebound  or guarding  Extremities: warm; no LE edema; no clubbing or cyanosis  Neuro: symmetric facial movements, clear speech, moving all extremities                       [1]   Current Facility-Administered Medications   Medication Dose Route Frequency    amLODIPine   10 mg Oral Daily    ampicillin -sulbactam  3 g Intravenous Q8H    anastrozole   1 mg Oral QAM    aspirin   81 mg Oral Daily    famotidine   20 mg Oral Daily    levothyroxine   25 mcg Oral Daily at 0600    lisinopril   40 mg Oral Daily    multivitamin with minerals  1 tablet Oral QAM    rosuvastatin   40 mg Oral Daily   [2]   Current Facility-Administered Medications   Medication Dose Route Frequency Last Rate    sodium chloride    Intravenous Continuous 125 mL/hr at 09/16/24 1122    heparin  infusion 25,000 units/500 mL (Cardiac/Low Intensity)  12 Units/kg/hr Intravenous Continuous 13 Units/kg/hr (09/16/24 1402)   [3]   Current Facility-Administered Medications   Medication Dose Route  acetaminophen   500 mg Oral    benzocaine -menthol   1 lozenge Buccal    benzonatate   100 mg Oral    carboxymethylcellulose sodium  1 drop Both Eyes    dextrose   15 g of glucose Oral    Or    dextrose   12.5 g Intravenous    Or    dextrose   12.5 g Intravenous    Or    glucagon  (rDNA)  1 mg Intramuscular    heparin  (porcine)  3,000 Units Intravenous    hydrALAZINE   25 mg Oral    meclizine   12.5 mg Oral    melatonin  3 mg Oral    metoprolol  tartrate  0-200 mg Oral    naloxone   0.2 mg Intravenous    nitroglycerin   0.4 mg Sublingual    nitroglycerin   0.4 mg Sublingual    ondansetron   4 mg Oral    Or    ondansetron   4 mg Intravenous    saline  2 spray Each Nare   [4]   Allergies  Allergen Reactions    Morphine  Itching    Morphine  And Codeine  Itching and Nausea And Vomiting     dizzy

## 2024-09-16 NOTE — Plan of Care (Signed)
 Problem: Pain interferes with ability to perform ADL  Goal: Pain at adequate level as identified by patient  Outcome: Progressing  Flowsheets (Taken 09/16/2024 2334)  Pain at adequate level as identified by patient:   Identify patient comfort function goal   Assess for risk of opioid induced respiratory depression, including snoring/sleep apnea. Alert healthcare team of risk factors identified.   Assess pain on admission, during daily assessment and/or before any as needed intervention(s)   Reassess pain within 30-60 minutes of any procedure/intervention, per Pain Assessment, Intervention, Reassessment (AIR) Cycle   Evaluate if patient comfort function goal is met   Consult/collaborate with Pain Service     Problem: Moderate/High Fall Risk Score >5  Goal: Patient will remain free of falls  Outcome: Progressing  Flowsheets (Taken 09/16/2024 1952)  High (Greater than 13):   HIGH-Consider use of low bed   HIGH-Visual cue at entrance to patient's room   HIGH-Bed alarm on at all times while patient in bed   HIGH-Utilize chair pad alarm for patient while in the chair   HIGH-Apply yellow Fall Risk arm band     Problem: Safety  Goal: Patient will be free from injury during hospitalization  Outcome: Progressing  Flowsheets (Taken 09/16/2024 2334)  Patient will be free from injury during hospitalization:   Assess patient's risk for falls and implement fall prevention plan of care per policy   Provide and maintain safe environment   Use appropriate transfer methods   Ensure appropriate safety devices are available at the bedside   Include patient/ family/ care giver in decisions related to safety   Hourly rounding  Goal: Patient will be free from infection during hospitalization  Outcome: Progressing  Flowsheets (Taken 09/16/2024 1948 by Harl Marsh, RN)  Free from Infection during hospitalization:   Monitor lab/diagnostic results   Monitor all insertion sites (i.e. indwelling lines, tubes, urinary catheters, and  drains)   Assess and monitor for signs and symptoms of infection     Problem: Pain  Goal: Pain at adequate level as identified by patient  Outcome: Progressing  Flowsheets (Taken 09/16/2024 2334)  Pain at adequate level as identified by patient:   Identify patient comfort function goal   Assess for risk of opioid induced respiratory depression, including snoring/sleep apnea. Alert healthcare team of risk factors identified.   Assess pain on admission, during daily assessment and/or before any as needed intervention(s)   Reassess pain within 30-60 minutes of any procedure/intervention, per Pain Assessment, Intervention, Reassessment (AIR) Cycle   Evaluate if patient comfort function goal is met   Consult/collaborate with Pain Service     Problem: Hemodynamic Status: Cardiac  Goal: Stable vital signs and fluid balance  Outcome: Progressing  Flowsheets (Taken 09/16/2024 1948 by Harl Marsh, RN)  Stable vital signs and fluid balance:   Assess signs and symptoms associated with cardiac rhythm changes   Monitor lab values     Problem: Inadequate Tissue Perfusion  Goal: Adequate tissue perfusion will be maintained  Outcome: Progressing  Flowsheets (Taken 09/16/2024 0459 by Garnell Gailen NOVAK, RN)  Adequate tissue perfusion will be maintained:   Monitor/assess lab values and report abnormal values   Monitor/assess neurovascular status (pulses, capillary refill, pain, paresthesia, paralysis, presence of edema)   Monitor/assess for signs of VTE (edema of calf/thigh redness, pain)   Monitor for signs and symptoms of a pulmonary embolism (dyspnea, tachypnea, tachycardia, confusion)   Encourage/assist patient as needed to turn, cough, and perform deep breathing every 2 hours  Problem: Ineffective Gas Exchange  Goal: Effective breathing pattern  Outcome: Progressing  Flowsheets (Taken 09/16/2024 0459 by Garnell Gailen NOVAK, RN)  Effective breathing pattern: Teach/reinforce use of ordered respiratory interventions (ie.  CPAP, BiPAP, Incentive Spirometer, Acapella)

## 2024-09-16 NOTE — Plan of Care (Signed)
 Recent anti Xa greater than 2.0 Hep stopped per PA APTT ordered. Result pending.

## 2024-09-16 NOTE — Pharmacy Admission Med History Advanced (Signed)
 Admission Medication History    Prior to Admission Medication List:  Home Medications       Med List Status: Pharmacy Completed Set By: Liborio Nat LABOR at 09/16/2024 12:01 PM              amLODIPine  (NORVASC ) 10 MG tablet     Take 1 tablet (10 mg) by mouth once daily     Patient taking differently: Take 1 tablet (10 mg) by mouth once daily     anastrozole  (ARIMIDEX ) 1 MG tablet     Take 1 tablet (1 mg total) by mouth every morning     aspirin  81 MG chewable tablet     Chew 1 tablet (81 mg total) by mouth daily     levothyroxine  (SYNTHROID ) 25 MCG tablet     Take 1 tablet (25 mcg) by mouth Once a day at 6:00am     lisinopril  (ZESTRIL ) 40 MG tablet     Take 1 tablet (40 mg) by mouth once daily     meclizine  (ANTIVERT ) 12.5 MG tablet     Take 1 tablet (12.5 mg) by mouth 3 (three) times daily as needed for Dizziness     Multiple Vitamins-Minerals (MULTIVITAMIN WITH MINERALS) tablet     Take 1 tablet by mouth once every morning     pantoprazole  (PROTONIX ) 40 MG tablet (Expired)     Take 1 tablet (40 mg) by mouth every morning before breakfast     rosuvastatin  (CRESTOR ) 40 MG tablet     Take 1 tablet (40 mg) by mouth once daily     Patient taking differently: Take 1 tablet (40 mg) by mouth once daily     thiamine  (B-1) 100 MG tablet     Take 1 tablet (100 mg) by mouth once daily                Summary:    Medication reconciliation by provider has occurred already.     The following medication changes were documented in the prior to admission medication list:  The following medications were changed (reason if applicable):  none  The following medications were removed/marked as not taking (reason if applicable):    The following medications were added:  none    Home Medication Screening:  Did the patient bring any medications into the hospital with them? No    Additional Comments: Spoke with pt to complete med rec. Pt confirmed all meds and reported she last took them 2 days ago.    Per pt she is no longer on Ibandronate.      Patient demonstrates Good compliance with medications.  Patient demonstrates Good knowledge of medications.     Primary Source of Medication History: Self and Epic Medication Dispense Record    Patient's Pharmacy Information:  Primary Pharmacy Updated in Epic: No    Preferred pharmacy:   [facility] [address] [place] - [address]  [address]  [place] [address]  Phone: [phone] Fax: [phone]      Note prepared by: Nat Liborio     This Medication history was completed by a pharmacy technician and reviewed by a pharmacist.  Medication histories completed by pharmacy have been conducted to the best of our abilities utilizing multiple sources of information but errors and omissions may exist. For questions or clarifications about this medication history please reach out to pharmacy.

## 2024-09-16 NOTE — Plan of Care (Signed)
 Problem: Safety  Goal: Patient will be free from injury during hospitalization  Flowsheets (Taken 09/16/2024 1948)  Patient will be free from injury during hospitalization:   Assess patient's risk for falls and implement fall prevention plan of care per policy   Provide and maintain safe environment   Hourly rounding  Goal: Patient will be free from infection during hospitalization  Flowsheets (Taken 09/16/2024 1948)  Free from Infection during hospitalization:   Monitor lab/diagnostic results   Monitor all insertion sites (i.e. indwelling lines, tubes, urinary catheters, and drains)   Assess and monitor for signs and symptoms of infection     Problem: Psychosocial and Spiritual Needs  Goal: Demonstrates ability to cope with hospitalization/illness  Flowsheets (Taken 09/16/2024 1948)  Demonstrates ability to cope with hospitalizations/illness:   Provide quiet environment   Assist patient to identify own strengths and abilities     Problem: Hemodynamic Status: Cardiac  Goal: Stable vital signs and fluid balance  Flowsheets (Taken 09/16/2024 1948)  Stable vital signs and fluid balance:   Assess signs and symptoms associated with cardiac rhythm changes   Monitor lab values

## 2024-09-16 NOTE — Consults (Signed)
 CARDIOLOGY HOSPITAL NOTE  Annah Skiff, MD   [facility]  Office phone:  [phone]     [facility] phone [phone]  [facility] phone [phone]    Date:  09/16/2024  8:57 AM    Patient:  Emily [name].  DOB: 07-14-1946.  78 y.o.  female , MRN [id number]   Attending:  Dennise Ates, MD   Admission date:  09/15/2024     ASSESSMENT    78 y.o. female history of DM, HPL presents with multitude of symptoms including not feeling well and chest discomfort        IMPRESSION       Elevated troponins  Possibly type II non-STEMI due to demand ischemia,   Mobility significant risk factors cardiology elevated new onset chest tightness and would recommend further evaluation with a CTA coronaries  Since she received the CT contrast load today with actual we will defer CTA for tomorrow  Continue IV heparin  for now  Recommend ASA  Echo to look for wall motion abnormalities     UTI  On Rx      HTN  Continue amlodipine  10 mg daily and lisinopril  40 mg daily    HPL  LDL reasonable on Crestor  40 mg daily  Lipids reviewed from 05/29/2024 with LDL of 93, triglycerides of 47 and total cholesterol 149      Breast cancer status post right-sided mastectomy        HISTORY OF PRESENTING ILLNESS   78 y.o. female history of DM, HPL presents with multitude of symptoms including not feeling well and chest discomfort    Patient was in usual state of health with no exertional angina or dyspnea  09/15/2024 when she visited her sister she felt like she was not feeling well, she described normal generalized weakness myalgias, chills nausea  She felt the urge to urinate and every time she stood up she felt lightheaded and had to sit back down and she repeated this twice and finally had to go to the bathroom    She also experienced chest pressure which felt like something was pushing on her left breast lasting for several hours and decided to call EMS for further evaluation  She is unable to characterize  the chest discomfort but felt that she never experienced      Currently lying down in bed with no significant symptoms  In the ED noted to have elevated troponins and UA positive for UTI    MEDICATIONS:    INFUSION MEDS:      heparin  infusion 25,000 units/500 mL (Cardiac/Low Intensity) Stopped (09/16/24 0830)      SCHEDULED MEDS:   Current Facility-Administered Medications[1]   PRN MEDS:   acetaminophen , benzocaine -menthol , benzonatate , carboxymethylcellulose sodium, dextrose  **OR** dextrose  **OR** dextrose  **OR** glucagon  (rDNA), heparin  (porcine), meclizine , melatonin, naloxone , nitroglycerin , ondansetron  **OR** ondansetron , saline    ALLERGIES:   Allergies[2]     DIAGNOSTICS:     ECG:  (I independently reviewed the ECG tracing)  Normal sinus rhythm at 96 bpm.  ST and T wave abnormality consider inferolateral ischemia    Compared to prior EKG from 20 August 2024 ST-T wave changes are new    Telemetry:  (I independently reviewed the telemetry data)  NSR    Chest -Xray:( I independently reviewed the chest x-ray images )09/15/2024  No active disease noted    Echocardiogram: (I personally reviewed echo report)05/29/2024  Normal LV size.  LVEF of 67% by biplane.  Normal RV size and  function.  No significant valvular disease.    PAST MEDICAL HISTORY:   Medical History[3]  Past Surgical History[4]    SOCIAL HISTORY:     Social History     Tobacco Use    Smoking status: Never    Smokeless tobacco: Never   Substance Use Topics    Alcohol use: Yes     Comment: wine       FAMILY HISTORY:   family history is not on file.    PHYSICAL EXAM:                                                           BP 162/65   Pulse 64   Temp 98.3 F (36.8 C) (Oral)   Resp 22   Ht 1.626 m (5' 4)   Wt 65.9 kg (145 lb 4.5 oz)   SpO2 97%   BMI 24.94 kg/m    Intake/Output Summary (Last 24 hours) at 09/16/2024 0857  Last data filed at 09/16/2024 0500  Gross per 24 hour   Intake 969.3 ml   Output --   Net 969.3 ml       LABS:   Labs  reviewed personally:    CBC w/Diff   Recent Labs   Lab 09/15/24  1700   WBC 8.04   Hemoglobin 12.3   Hematocrit 40.6   Platelet Count 296        Basic Metabolic Profile   Recent Labs   Lab 09/15/24  2340 09/15/24  1700   Sodium 142 142   Potassium 3.6 3.6   Chloride 104 101   CO2 28 28   BUN 13 15   Creatinine 1.0 1.0   GFR 56.6* 56.6*   Glucose 91 85   Calcium  9.4 10.1        Cardiac Enzymes   Recent Labs   Lab 09/16/24  0638 09/16/24  0338 09/15/24  2131 09/15/24  1839 09/15/24  1700   Creatine Kinase (CK)  --   --   --   --  97   hs Troponin 59.7* 61.1* 56.5*  More results in Results Review 10.0   More results in Results Review = values in this interval not displayed.            D-dimer          Thyroid  Studies         Invalid input(s): FREET4     Cholesterol Panel          Coagulation Studies   Recent Labs   Lab 09/16/24  0338 09/15/24  2340   PT 17.6*  --    INR 1.6*  --    PTT 73* 43*          FLUID BALANCE AND WEIGHT:                                           Last filed weight: Weight: 65.9 kg (145 lb 4.5 oz)  Weight on Admission: Weight: 68.1 kg (150 lb 2.1 oz)  Net IO Since Admission: 969.3 mL [09/16/24 0857]     Intake/Output Summary (Last 24 hours) at 09/16/2024 0700  Last data filed at  09/16/2024 0500  Gross per 24 hour   Intake 969.3 ml   Output --   Net 969.3 ml   Weight change:     Last 3 Weights for the past 72 hrs (Last 3 readings):   Weight   09/15/24 2253 65.9 kg (145 lb 4.5 oz)   09/15/24 1653 68.1 kg (150 lb 2.1 oz)        PHYSICAL EXAM:                                                                 BP 162/65   Pulse 64   Temp 98.3 F (36.8 C) (Oral)   Resp 22   Ht 1.626 m (5' 4)   Wt 65.9 kg (145 lb 4.5 oz)   SpO2 97%   BMI 24.94 kg/m   General Appearance: Well-appearing in no acute distress.   HEENT: normocephalic, atraumatic  LUNGS: Breathing comfortably without accessory muscle use. Speaking in full sentences. Lungs clear to auscultation bilaterally without crackles or wheezes,  normal respiratory effort.  CARDIOVASCULAR:   JVP normal, No hepatojugular reflux.  Carotids no bruits.  Apical impulse non-displaced, no right ventricular heave or lift.  Regular rate and rhythm. Normal S1, S2; no S3, S4. No murmurs, rubs, or gallops.    ABDOMEN:  normal bowel sounds,soft, non-tender and non distended; no rebound or guarding  EXTREMITIES: Warm and well perfused. and No edema noted bilaterally.+ DP pulses bilaterally.   NEURO: Alert and oriented x3. Grossly intact.  Normal mood and affect.             [1]   Current Facility-Administered Medications   Medication Dose Route Frequency    amLODIPine   10 mg Oral Daily    ampicillin -sulbactam  3 g Intravenous Q8H    anastrozole   1 mg Oral QAM    aspirin   81 mg Oral Daily    famotidine   20 mg Oral Daily    levothyroxine   25 mcg Oral Daily at 0600    lisinopril   40 mg Oral Daily    multivitamin with minerals  1 tablet Oral QAM    nitroglycerin   0.5 inch Topical Q6H    rosuvastatin   40 mg Oral Daily   [2]   Allergies  Allergen Reactions    Morphine  Itching    Morphine  And Codeine  Itching and Nausea And Vomiting     dizzy   [3]   Past Medical History:  Diagnosis Date    Breast cancer (CMS/HCC) 2014    right breast mastectomy    Exercise-induced angina     negative work up    GERD (gastroesophageal reflux disease)     Headache     resolved    Hyperlipidemia     Hypertension     Hypothyroidism     Low back pain     Malignant neoplasm of overlapping sites of right female breast (CMS/HCC) 10/19/2015    Syncope and collapse     Chronic Vertigo    Vertigo    [4]   Past Surgical History:  Procedure Laterality Date    COLONOSCOPY, DIAGNOSTIC (SCREENING)  2021    COLONOSCOPY, REMOVAL OF TUMOR, POLYP, OR OTHER LESION BY SNARE TECHNIQUE N/A 07/21/2020    Procedure: COLONOSCOPY, POLYPECTOMY;  Surgeon: Jerri,  Dabo, MD;  Location: [facility];  Service: Gastroenterology;  Laterality: N/A;    ESOPHAGOGASTRODUODENOSCOPY (EGD), BIOPSY N/A 07/21/2020    Procedure: EGD, BIOPSY;   Surgeon: Jerri Nicolas, MD;  Location: [facility];  Service: Gastroenterology;  Laterality: N/A;    EXPLORATORY LAPAROTOMY N/A 05/22/2021    Procedure: EXPLORATORY LAPAROTOMY;  Surgeon: Odean Rollo HERO, MD;  Location: [facility];  Service: General;  Laterality: N/A;    HYSTERECTOMY      LAPAROSCOPY, DIAGNOSTIC N/A 05/22/2021    Procedure: LAPAROSCOPY, DIAGNOSTIC;  Surgeon: Odean Rollo HERO, MD;  Location: [facility];  Service: General;  Laterality: N/A;    LAPAROTOMY, COLECTOMY, RIGHT Right 05/22/2021    Procedure: LAPAROTOMY, COLECTOMY, RIGHT;  Surgeon: Odean Rollo HERO, MD;  Location: [facility];  Service: General;  Laterality: Right;    MASTECTOMY Right 2015

## 2024-09-16 NOTE — Plan of Care (Signed)
 Problem: Pain interferes with ability to perform ADL  Goal: Pain at adequate level as identified by patient  Outcome: Progressing  Flowsheets (Taken 09/16/2024 0459)  Pain at adequate level as identified by patient:   Identify patient comfort function goal   Assess pain on admission, during daily assessment and/or before any as needed intervention(s)   Reassess pain within 30-60 minutes of any procedure/intervention, per Pain Assessment, Intervention, Reassessment (AIR) Cycle   Evaluate if patient comfort function goal is met   Evaluate patient's satisfaction with pain management progress   Offer non-pharmacological pain management interventions   Include patient/patient care companion in decisions related to pain management as needed     Problem: Moderate/High Fall Risk Score >5  Goal: Patient will remain free of falls  Outcome: Progressing  Flowsheets (Taken 09/16/2024 0459)  Moderate Risk (6-13):   MOD-Consider activation of bed alarm if appropriate   LOW-Anticoagulation education for injury risk     Problem: Safety  Goal: Patient will be free from injury during hospitalization  Outcome: Progressing  Flowsheets (Taken 09/16/2024 0459)  Patient will be free from injury during hospitalization:   Assess patient's risk for falls and implement fall prevention plan of care per policy   Provide and maintain safe environment   Use appropriate transfer methods   Ensure appropriate safety devices are available at the bedside   Include patient/ family/ care giver in decisions related to safety   Hourly rounding  Goal: Patient will be free from infection during hospitalization  Outcome: Progressing  Flowsheets (Taken 09/16/2024 0459)  Free from Infection during hospitalization:   Assess and monitor for signs and symptoms of infection   Monitor lab/diagnostic results   Monitor all insertion sites (i.e. indwelling lines, tubes, urinary catheters, and drains)   Encourage patient and family to use good hand hygiene  technique     Problem: Pain  Goal: Pain at adequate level as identified by patient  Outcome: Progressing  Flowsheets (Taken 09/16/2024 0459)  Pain at adequate level as identified by patient:   Identify patient comfort function goal   Assess pain on admission, during daily assessment and/or before any as needed intervention(s)   Reassess pain within 30-60 minutes of any procedure/intervention, per Pain Assessment, Intervention, Reassessment (AIR) Cycle   Evaluate if patient comfort function goal is met   Evaluate patient's satisfaction with pain management progress   Offer non-pharmacological pain management interventions   Include patient/patient care companion in decisions related to pain management as needed     Problem: Discharge Barriers  Goal: Patient will be discharged home or other facility with appropriate resources  Outcome: Progressing  Flowsheets (Taken 09/16/2024 0459)  Discharge to home or other facility with appropriate resources:   Provide appropriate patient education   Provide information on available health resources     Problem: Psychosocial and Spiritual Needs  Goal: Demonstrates ability to cope with hospitalization/illness  Outcome: Progressing  Flowsheets (Taken 09/16/2024 0459)  Demonstrates ability to cope with hospitalizations/illness:   Encourage verbalization of feelings/concerns/expectations   Provide quiet environment   Include patient/ patient care companion in decisions     Problem: Hemodynamic Status: Cardiac  Goal: Stable vital signs and fluid balance  Outcome: Progressing  Flowsheets (Taken 09/16/2024 0459)  Stable vital signs and fluid balance:   Assess signs and symptoms associated with cardiac rhythm changes   Monitor lab values     Problem: Inadequate Tissue Perfusion  Goal: Adequate tissue perfusion will be maintained  Outcome: Progressing  Flowsheets (  Taken 09/16/2024 0459)  Adequate tissue perfusion will be maintained:   Monitor/assess lab values and report abnormal  values   Monitor/assess neurovascular status (pulses, capillary refill, pain, paresthesia, paralysis, presence of edema)   Monitor/assess for signs of VTE (edema of calf/thigh redness, pain)   Monitor for signs and symptoms of a pulmonary embolism (dyspnea, tachypnea, tachycardia, confusion)   Encourage/assist patient as needed to turn, cough, and perform deep breathing every 2 hours     Problem: Ineffective Gas Exchange  Goal: Effective breathing pattern  Outcome: Progressing  Flowsheets (Taken 09/16/2024 0459)  Effective breathing pattern: Teach/reinforce use of ordered respiratory interventions (ie. CPAP, BiPAP, Incentive Spirometer, Acapella)     Problem: Bladder/Voiding  Goal: Patient will experience proper bladder emptying during admission and remain free from infection  Outcome: Progressing  Flowsheets (Taken 09/16/2024 0459)  Patient will experience proper bladder emptying during admission and remain free from infection: Encourage bladder emptying at regular intervals              Patient received from ED at bout 2248 on room air without any distress. Patient encouraged to call for assist when needed for safety and will continue to monitor at frequent interval. Patient oriented to room, call bell and safety policies. Patient alert and oriented x 4, able to verbalized needs and asked questions regarding care. Afebrile thus far, scheduled IV antibiotic administered. Patient runs sinus rhythm to sinus brady on the monitor specially when at rest. Patient able to ambulate with use of walker with minimal assist. Patient encouraged to verbalized feelings and rest.

## 2024-09-17 ENCOUNTER — Telehealth (INDEPENDENT_AMBULATORY_CARE_PROVIDER_SITE_OTHER): Payer: Self-pay

## 2024-09-17 ENCOUNTER — Inpatient Hospital Stay

## 2024-09-17 DIAGNOSIS — Z853 Personal history of malignant neoplasm of breast: Secondary | ICD-10-CM

## 2024-09-17 DIAGNOSIS — E039 Hypothyroidism, unspecified: Secondary | ICD-10-CM

## 2024-09-17 DIAGNOSIS — R0789 Other chest pain: Secondary | ICD-10-CM

## 2024-09-17 DIAGNOSIS — R7989 Other specified abnormal findings of blood chemistry: Secondary | ICD-10-CM

## 2024-09-17 LAB — ECG 12-LEAD
Atrial Rate: 78 {beats}/min
IHS MUSE NARRATIVE AND IMPRESSION: NORMAL
P Axis: 56 degrees
P-R Interval: 188 ms
Q-T Interval: 370 ms
QRS Duration: 84 ms
QTC Calculation (Bezet): 421 ms
R Axis: 46 degrees
T Axis: 34 degrees
Ventricular Rate: 78 {beats}/min

## 2024-09-17 LAB — CBC
Absolute nRBC: 0 x10 3/uL (ref <=0.00)
Hematocrit: 35.3 % (ref 34.7–43.7)
Hemoglobin: 10.6 g/dL — ABNORMAL LOW (ref 11.4–14.8)
MCH: 26.1 pg (ref 25.1–33.5)
MCHC: 30 g/dL — ABNORMAL LOW (ref 31.5–35.8)
MCV: 86.9 fL (ref 78.0–96.0)
MPV: 11.5 fL (ref 8.9–12.5)
Platelet Count: 275 x10 3/uL (ref 142–346)
RBC: 4.06 x10 6/uL (ref 3.90–5.10)
RDW: 13 % (ref 11–15)
WBC: 5.61 x10 3/uL (ref 3.10–9.50)
nRBC %: 0 /100{WBCs} (ref <=0.0)

## 2024-09-17 LAB — BASIC METABOLIC PANEL
Anion Gap: 9 (ref 5.0–15.0)
BUN: 8 mg/dL (ref 7–21)
CO2: 27 meq/L (ref 17–29)
Calcium: 9.1 mg/dL (ref 7.9–10.2)
Chloride: 107 meq/L (ref 99–111)
Creatinine: 1.1 mg/dL — ABNORMAL HIGH (ref 0.4–1.0)
GFR: 50.5 mL/min/1.73 m2 — ABNORMAL LOW (ref >=60.0)
Glucose: 87 mg/dL (ref 70–100)
Potassium: 3.9 meq/L (ref 3.5–5.3)
Sodium: 143 meq/L (ref 135–145)

## 2024-09-17 LAB — APTT
PTT: 79 s — ABNORMAL HIGH (ref 27–39)
PTT: 42 s — ABNORMAL HIGH (ref 27–39)

## 2024-09-17 LAB — PROCALCITONIN: Procalcitonin: 0.05 ng/mL (ref 0.00–0.10)

## 2024-09-17 MED ORDER — SULFAMETHOXAZOLE-TRIMETHOPRIM 400-80 MG PO TABS: 1.0000 | ORAL_TABLET | Freq: Two times a day (BID) | ORAL | 0 refills | Status: AC

## 2024-09-17 MED ORDER — NITROGLYCERIN 0.4 MG SL SUBL
SUBLINGUAL_TABLET | SUBLINGUAL | Status: AC
  Filled 2024-09-17: qty 25

## 2024-09-17 MED ORDER — IOHEXOL 350 MG/ML IV SOLN
80.0000 mL | Freq: Once | INTRAVENOUS | Status: AC | PRN
  Administered 2024-09-17: 80 mL via INTRAVENOUS
  Filled 2024-09-17: qty 100

## 2024-09-17 MED ORDER — LIDOCAINE 5 % EX PTCH: 1.0000 | MEDICATED_PATCH | CUTANEOUS | Status: DC | PRN

## 2024-09-17 NOTE — Progress Notes (Signed)
 CARDIOLOGY HOSPITAL NOTE  Annabella MARLA Clara, NP   [facility]  Office phone:  [phone]     [facility] phone [phone]  [facility] phone [phone]    Date:  09/17/2024  10:26 AM    Patient:  Emily [name].  DOB: Apr 18, 1946.  78 y.o.  female , MRN: [id number]   Attending:  Von Morelle, MD   Admission date:  09/15/2024         Assessment and Plan:   I personally spoke to the patient, performed a physical exam, and reviewed pertinent labs/radiology/diagnostic studies. The medical decision making was formulated by me in discussion with the APP. I agree with the note written by the APP after review and edited with caveats when appropriate.     5 F with DM, HL, pw chest pain, elevated troponin.     CP/elevated troponin. Suspect demand ischemia. No chest pain for the last 48hrs. Echo showed normal function with no WMAs. Ok to stop heparin  gtt. Coronary CTA pending.  HTN. BPs controlled. Cont to follow.   HL. On high-intensity statin.     If coronary CTA shows no obstructive CAD, no further recommendations. F/u with Dr. Verline upon discharge      ASSESSMENT AND PLAN   Emily [name] is a 78 y.o. with  history of DM, HPL presents with multitude of symptoms including not feeling well and chest discomfort     #Chest pain  #Elevated troponin  hs-troponins Peaked at 61  ZXH:[id number]  Risk factors: HTN, HLD  Likely type II NSTEMI due to demand.   Plan for echo to rule out wall motion abnormalities. Pending coronary CTA today.     #HTN  Well controlled  Current regimen includes amlodipine  10mg  and lisinopril  40mg  daily   Will not make any changes.     #HLD  Most recent LDL 93 on 05/29/24  Continues on Crestor  40mg  daily   Discussed ongoing dietary guidelines for heart healthy diet and exercise.     #UTI  Continue on antibiotics      HPI and INTERVAL HISTORY     78 y.o. female history of DM, HPL presents with multitude of symptoms including not feeling well  and chest discomfort     Patient was in usual state of health with no exertional angina or dyspnea  09/15/2024 when she visited her sister she felt like she was not feeling well, she described normal generalized weakness myalgias, chills nausea  She felt the urge to urinate and every time she stood up she felt lightheaded and had to sit back down and she repeated this twice and finally had to go to the bathroom    PHYSICAL EXAM:                                             BP 130/69   Pulse 88   Temp 98.2 F (36.8 C) (Oral)   Resp 18   Ht 1.626 m (5' 4)   Wt 65.9 kg (145 lb 4.5 oz)   SpO2 98%   BMI 24.94 kg/m   General: Well appearing   HENT: Normocephalic, pupils round and reactive, mucosa moist  Neck: No lymphadenopathy  Respiratory: clear to auscultation, no rales or rhonchi or wheezes  Cardiovascular: no JVD, no carotid bruits. normal rate, regular rhythm, normal S1 and S2; no heaves  or lifts; no murmurs, no gallops or rubs   GI: soft, non-tender, bowel sounds normal  Ext: warm, no edema, no cyanosis   Skin: warm and dry, no rashes.   Neuro/Psych: alert and oriented, mood appropriate for situation.         MEDICATIONS    INFUSION MEDS:      SCHEDULED MEDS:   Current Facility-Administered Medications[1]   PRN MEDS:   acetaminophen , benzocaine -menthol , benzonatate , carboxymethylcellulose sodium, dextrose  **OR** dextrose  **OR** dextrose  **OR** glucagon  (rDNA), heparin  (porcine), hydrALAZINE , lidocaine , meclizine , melatonin, metoprolol  tartrate, naloxone , nitroglycerin , nitroglycerin , ondansetron  **OR** ondansetron , saline  FLUID BALANCE AND WEIGHT                                        Last filed weight: Weight: 65.9 kg (145 lb 4.5 oz)  Weight on Admission: Weight: 68.1 kg (150 lb 2.1 oz)  Net IO Since Admission: 1,499.3 mL [09/17/24 1026]     Intake/Output Summary (Last 24 hours) at 09/17/2024 0700  Last data filed at 09/16/2024 2134  Gross per 24 hour   Intake 530 ml   Output --   Net 530 ml   Weight  change:       LABS   Labs reviewed:  Recent Labs   Lab 09/17/24  0526 09/15/24  1700   WBC 5.61 8.04   Hemoglobin 10.6* 12.3   Hematocrit 35.3 40.6   Platelet Count 275 296     Recent Labs   Lab 09/17/24  0526 09/16/24  1333 09/15/24  2340   Sodium 143 146* 142   Potassium 3.9 4.5 3.6   Chloride 107 108 104   CO2 27 27 28    BUN 8 10 13    Creatinine 1.1* 1.0 1.0   GFR 50.5* 56.6* 56.6*   Glucose 87 79 91   Calcium  9.1 9.4 9.4     Recent Labs   Lab 09/16/24  0638 09/16/24  0338 09/15/24  2131   hs Troponin 59.7* 61.1* 56.5*               DIAGNOSTICS     Telemetry: 09/17/24 NSR 70s    EKG:09/16/24  Normal sinus rhythm at 96 bpm. ST and T wave abnormality consider inferolateral ischemia     Echocardiogram:05/29/2024  Normal LV size.  LVEF of 67% by biplane.  Normal RV size and function.  No significant valvular disease.            Annabella MARLA Clara, NP         [1]   Current Facility-Administered Medications   Medication Dose Route Frequency    amLODIPine   10 mg Oral Daily    anastrozole   1 mg Oral QAM    aspirin   81 mg Oral Daily    famotidine   20 mg Oral Daily    levothyroxine   25 mcg Oral Daily at 0600    lisinopril   40 mg Oral Daily    multivitamin with minerals  1 tablet Oral QAM    rosuvastatin   40 mg Oral Daily    sulfamethoxazole -trimethoprim   1 tablet Oral Q12H  [facility]

## 2024-09-17 NOTE — Telephone Encounter (Signed)
 Called twice and LVM. VM is in regards to schedule discharge f/u with APP in 1-2 weeks and f/u with Dr. LyleGLENWOOD Molt in 6-8 weeks. Left cardiac connect number as well.   Sent MyChart message too.

## 2024-09-17 NOTE — Nursing Progress Note (Signed)
 Patient received in stable condition at shift change. Patient denied chest pain and SOB. However she did complain of left hip pain and was given PRN tylenol . Heparin  drip was paused for 60 mins d/t ptt being elevated 22sec and restarted after an hour. Patient is able to ambulate 1 sba to the bathroom.

## 2024-09-17 NOTE — Progress Notes (Signed)
 Case Management location: onsite    [place] CHECKLIST     09/17/24 1638   Medicare Checklist   Is this a Medicare patient? Yes   Patient received 1st IMM Letter? Yes   Discharge Disposition   Patient preference/choice provided? Yes   Physical Discharge Disposition Home   Mode of Transportation Car  ( Transportation: private car [place], [place] [phone] [place], [place]  [phone] OR [phone] - Daughter).)   Patient/Family/POA notified of transfer plan Yes   Patient agreeable to discharge plan/expected d/c date? Yes   Family/POA agreeable to discharge plan/expected d/c date? Yes   Bedside nurse notified of transport plan? Yes   CM Interventions   Follow up appointment scheduled?(For PNA, COPD, MI) No   Reason no follow up scheduled? Family to schedule   Referral made for home health RN visit? No, Other (comment)   Multidisciplinary rounds/family meeting before d/c? Yes     Marinda Ruths, RN BSN  RN Case Manager 1  Case Management  [facility]

## 2024-09-17 NOTE — Discharge Summary (Signed)
 [facility]  Internal Medicine Hospitalists  Discharge Summary          Date of Admission: 09/15/2024  Date of Discharge: 09/17/2024    Discharge Diagnoses:   Chest pain with elevated troponins resolved    Additional Diagnoses:              Hospital Course:      Reason for Admission:  Chest pain  UTI  Hospital Course:  No notes on file  Emily [name] is a 78 y.o. female with      PMHx of breast cancer s/p right mastectomy admitted w/ chest pain, palpitations and not feeling well per patient.   Troponins noted to be elevated overnight and trended flat, denies any chest pain currently     #1 chest pain with elevated troponin/non ischemic Myocardial Injury  Likely demand ischemia heparin  drip stopped as the patient is currently chest pain-free  Ordered coronary CTA which is pending  Echocardiogram showed EF of 65 to 70% and grade 2 diastolic dysfunction     #2 essential hypertension  Continue amlodipine  and lisinopril      #3 mixed hyperlipidemia  Continue rosuvastatin      Acquired hypothyroidism  Continue Synthroid      #hypothyroidism   On synthroid       #History of breast cancer  On anastrozole   Possible UTI  Continue Bactrim      Patient had coronary CTA    Coronary CT today 1.Minimal plaques in LAD and LCx less than 25% stenosis.  2.Calcium  score 17.   3.Right lower lobe scarring, volume loss, with endobronchial impaction and  nodularity, not significantly changed since 02/04/2024. Correlate for  aspiration and attention on follow-up.     Patient was chest pain-free on heparin  was discontinued and was clinically stable at discharge we will send a prescription for 2 more days of Bactrim      Discharge Day Exam:   Temp:  [97.5 F (36.4 C)-98.2 F (36.8 C)] 97.9 F (36.6 C)  Heart Rate:  [69-88] 69  Resp Rate:  [16-18] 18  BP: (117-166)/(59-79) 138/79  General: awake, alert, oriented x 3; no acute distress  HEENT: anicteric sclerae, PERRL, EOMI; MMM  Cardiovascular: regular rate and  rhythm; no murmurs, rubs, or gallops  Lungs: clear to auscultation bilaterally without wheezing, rhonchi, or rales  Abdomen: soft, non-distended; non-tender to palpation, no rebound or guarding  Extremities: warm; no LE edema; no clubbing or cyanosis  Neuro: symmetric facial movements, clear speech, moving all extremities        Pertinent Labs:   CBC:   Recent Labs   Lab 09/17/24  0526 09/15/24  1700   WBC 5.61 8.04   Hemoglobin 10.6* 12.3   Hematocrit 35.3 40.6   Platelet Count 275 296   MCV 86.9 87.9     BMP:   Recent Labs   Lab 09/17/24  0526 09/16/24  1333 09/15/24  2340   Sodium 143 146* 142   Potassium 3.9 4.5 3.6   Chloride 107 108 104   CO2 27 27 28    BUN 8 10 13    Creatinine 1.1* 1.0 1.0   Calcium  9.1 9.4 9.4   Glucose 87 79 91     LFT:   Recent Labs   Lab 09/15/24  1700   Albumin  4.2   Protein, Total 7.2   Bilirubin, Total 0.4   Alkaline Phosphatase 80   ALT 11   AST (SGOT) 30  Radiology and Procedures:   Radiology: all results in the last 7 days  CTA Coronary  Result Date: 09/17/2024  1.Minimal plaques in LAD and LCx less than 25% stenosis. 2.Calcium  score 17. 3.Right lower lobe scarring, volume loss, with endobronchial impaction and nodularity, not significantly changed since 02/04/2024. Correlate for aspiration and attention on follow-up. French Peri, MD, PhD 09/17/2024 2:23 PM    CT Abdomen Pelvis W WO IV/ W PO Cont  Result Date: 09/16/2024    1. Negative for acute process. 2. Bilateral renal cysts. 3. Gallstones versus sludge. 4. Status post hysterectomy and right colectomy. Norleen HILARIO Ruth, MD 09/16/2024 1:52 AM    Chest AP Portable  Result Date: 09/15/2024  No detectable active cardiopulmonary disease. Bruce A. Enedelia, MD 09/15/2024 6:22 PM     Discharge Medications and Documented Allergies:        Discharge Medication List        Taking      amLODIPine  10 MG tablet  Dose: 10 mg  Commonly known as: NORVASC   For: High Blood Pressure  Take 1 tablet (10 mg) by mouth once daily     anastrozole  1  MG tablet  Dose: 1 mg  Commonly known as: ARIMIDEX   For: Early Cancer of the Breast in Postmenopausal Women  Take 1 tablet (1 mg total) by mouth every morning     aspirin  81 MG chewable tablet  Dose: 81 mg  Chew 1 tablet (81 mg total) by mouth daily     levothyroxine  25 MCG tablet  Dose: 25 mcg  Commonly known as: SYNTHROID   Take 1 tablet (25 mcg) by mouth Once a day at 6:00am     lisinopril  40 MG tablet  Dose: 40 mg  Commonly known as: ZESTRIL   Take 1 tablet (40 mg) by mouth once daily     meclizine  12.5 MG tablet  Dose: 12.5 mg  Commonly known as: ANTIVERT   For: Dizzy  Take 1 tablet (12.5 mg) by mouth 3 (three) times daily as needed for Dizziness     multivitamin with minerals tablet  Dose: 1 tablet  Take 1 tablet by mouth once every morning     pantoprazole  40 MG tablet  Dose: 40 mg  Commonly known as: PROTONIX   For: Gastroesophageal Reflux Disease  Take 1 tablet (40 mg) by mouth every morning before breakfast     rosuvastatin  40 MG tablet  Dose: 40 mg  Commonly known as: CRESTOR   For: High Amount of Fats in the Blood  Take 1 tablet (40 mg) by mouth once daily     thiamine  100 MG tablet  Dose: 100 mg  Commonly known as: B-1  Take 1 tablet (100 mg) by mouth once daily              Allergies[1]         Disposition:     Discharge Disposition: Home with family    Discharge Code Status: Full Code    Patient Emergency Contact:  Extended Emergency Contact Information  Primary Emergency Contact: Amoah,Theophilus  Mobile Phone: [phone]  Relation: Son  Preferred language: English  Interpreter needed? No  Secondary Emergency Contact: Neal,Diana   [place]  Home Phone: [phone]  Mobile Phone: [phone]  Relation: Daughter    Discharge Instructions:     Patient Instructions: (See AVS for full details)      Most Recent Diet Order:  Orders Placed This Encounter   Procedures    Adult diet Therapeutic/ Modified; Solid;  Regular (IDDSI level 7); Thin (IDDSI level 0); Heart healthy       Activity/Weight  Bearing Status: Activity as tolerated    Wound Care:   none    Outpatient Follow-Up Plan:     Instructions for PCP:  Follow-up in 1 week    Appointments:   Follow-up Information       Jannett Theda HERO, MD .    Specialty: Internal Medicine  Contact information:  [address]  [place] [address]  [phone]                             Pending Labs, Microbiology, and Pathology:  Unresulted Labs       None            Attestations and Signatures:     Minutes spent coordinating discharge and reviewing discharge plan: 35 minutes      Wynelle Sor, MD              [1]   Allergies  Allergen Reactions    Morphine  Itching    Morphine  And Codeine  Itching and Nausea And Vomiting     dizzy

## 2024-09-17 NOTE — Discharge Instr - AVS First Page (Signed)
 Reason for your Hospital Admission:  Chest pain with elevated troponin resolved      Instructions for after your discharge:    Follow-up with PCP in 1 to 2 weeks

## 2024-09-17 NOTE — Progress Notes (Signed)
 Case Management location: onsite    CM completed assessment with pt in the room. CM introduced self, identified role, and explained reason for the visit. Pt confirmed demographics, insurance and PCP information.  Pt is independent with some of her cares and ambulates with DME walker. CM inquired about advance directives and pt stated that she does not have advance directive.    [place] Plan: Home with no needs when medically cleared.    [place] Transportation: private car [place], [place] [phone] [place] [address], [place]  [phone] OR [phone] - Daughter).       09/17/24 1617   Patient Type   Within 30 Days of Previous Admission? No   Healthcare Decisions   Interviewed: Patient   Orientation/Decision Making Abilities of Patient Alert and Oriented x3, able to make decisions   Advance Directive Patient does not have advance directive   Prior to admission   Prior level of function Needs assistance with ADLs   Type of Residence Other (Comment)   Home Layout Two level   Have running water , electricity, heat, etc? Yes   Living Arrangements Alone   How do you get to your MD appointments? self   How do you get your groceries? self   Who fixes your meals? self   Who does your laundry? self   Who picks up your prescriptions? self   Dressing Independent   Grooming Independent   Feeding Independent   Bathing Independent   Toileting Independent   Discharge Planning   Support Systems Children   Expected Discharge Disposition Home   Anticipated  plan discussed with: Same as interviewed   Mode of transportation: Private car (family member)  [place], [place] [phone] [place] [address] [place], [place]  [phone] OR [phone] - Daughter)   Does the patient have perscription coverage? Yes   Financial Resource Strain   How hard is it for you to pay for the very basics like food, housing, medical care, and heating? Not hard   Housing   Do you have housing? Y   Are you worried about losing your housing? N   Transportation Needs   In the past  12 months, has lack of transportation kept you from medical appointments or from getting medications? no   In the past 12 months, has lack of transportation kept you from meetings, work, or from getting things needed for daily living? No   Consults/Providers   Correct PCP listed in Epic? Yes   Family and PCP   PCP on file was verified as the current PCP? Yes   In case you are admitted, transferred or discharged, would like family notified? Yes   Name of family member to be notified INDA CARO [phone] [place] [address] [place] [phone] OR [phone] - Daughter   In case you are admitted, transferred or discharged, would like your PCP notified? Yes     Marinda Ruths, RN BSN  RN Case Manager 1  Case Management  [facility]

## 2024-09-17 NOTE — Progress Notes (Signed)
 VIRTUAL NURSE DISCHARGE NOTE:     Education: admission summary, f/u appts, medications-continue antibiotic to completion, s/s to seek medical attn      New discharge medications?: yes     Family present during D/C teaching?: no     Follow up appointments: yes     Ride: yes     Belongings: no belongings w security or pharmacy. Deferred to PN.

## 2024-09-17 NOTE — Progress Notes (Addendum)
 [facility]  Internal Medicine Hospitalists  Progress Note        Assessment / Plan:        Emily [name] is a 78 y.o. female with     PMHx of breast cancer s/p right mastectomy admitted w/ chest pain, palpitations and not feeling well per patient.   Troponins noted to be elevated overnight and trended flat, denies any chest pain currently    #1 chest pain with elevated troponin  Likely demand ischemia heparin  drip stopped as the patient is currently chest pain-free  Ordered coronary CTA which is pending  Echocardiogram showed EF of 65 to 70% and grade 2 diastolic dysfunction    #2 essential hypertension  Continue amlodipine  and lisinopril      #3 mixed hyperlipidemia  Continue rosuvastatin      Acquired hypothyroidism  Continue Synthroid      #hypothyroidism   On synthroid       #History of breast cancer  On anastrozole   Possible UTI  Continue Bactrim          VTE Prophylaxis: heparin  (porcine) injection 3,000 Units  Place sequential compression device     Foley Catheter: No Foley Present    Venous Access: No Temporary Central Line Present    Medical Readiness for Discharge: Anticipated Tomorrow    Open Handoff Activity in Sidebar    Additional Diagnoses:                      Medications and Allergies:   Current Facility-Administered Medications[1]  Infusion Meds[2]  PRN Medications[3]  Allergies[4]    Subjective:         Currently chest pain-free denies any shortness of breath         Objective:      Temp:  [97.5 F (36.4 C)-98.2 F (36.8 C)] 97.9 F (36.6 C)  Heart Rate:  [69-88] 69  Resp Rate:  [16-18] 18  BP: (117-166)/(59-79) 138/79   General: awake, alert, oriented x 3; no acute distress  HEENT: anicteric sclerae, PERRL, EOMI; MMM  Cardiovascular: regular rate and rhythm; no murmurs, rubs, or gallops  Lungs: clear to auscultation bilaterally without wheezing, rhonchi, or rales  Abdomen: soft, non-distended; non-tender to palpation, no rebound or guarding  Extremities: warm; no LE  edema; no clubbing or cyanosis  Neuro: symmetric facial movements, clear speech, moving all extremities             Wynelle Sor, MD            [1]   Current Facility-Administered Medications   Medication Dose Route Frequency    nitroglycerin         amLODIPine   10 mg Oral Daily    anastrozole   1 mg Oral QAM    aspirin   81 mg Oral Daily    famotidine   20 mg Oral Daily    levothyroxine   25 mcg Oral Daily at 0600    lisinopril   40 mg Oral Daily    multivitamin with minerals  1 tablet Oral QAM    rosuvastatin   40 mg Oral Daily    sulfamethoxazole -trimethoprim   1 tablet Oral Q12H SCH   [2]   Current Facility-Administered Medications   Medication Dose Route Frequency Last Rate   [3]   Current Facility-Administered Medications   Medication Dose Route    nitroglycerin        acetaminophen   500 mg Oral    benzocaine -menthol   1 lozenge Buccal    benzonatate   100 mg Oral  carboxymethylcellulose sodium  1 drop Both Eyes    dextrose   15 g of glucose Oral    Or    dextrose   12.5 g Intravenous    Or    dextrose   12.5 g Intravenous    Or    glucagon  (rDNA)  1 mg Intramuscular    heparin  (porcine)  3,000 Units Intravenous    hydrALAZINE   25 mg Oral    lidocaine   1 patch Transdermal    meclizine   12.5 mg Oral    melatonin  3 mg Oral    metoprolol  tartrate  0-200 mg Oral    naloxone   0.2 mg Intravenous    nitroglycerin   0.4 mg Sublingual    nitroglycerin   0.4 mg Sublingual    ondansetron   4 mg Oral    Or    ondansetron   4 mg Intravenous    saline  2 spray Each Nare   [4]   Allergies  Allergen Reactions    Morphine  Itching    Morphine  And Codeine  Itching and Nausea And Vomiting     dizzy

## 2024-09-17 NOTE — Plan of Care (Signed)
 Problem: Pain interferes with ability to perform ADL  Goal: Pain at adequate level as identified by patient  Outcome: Completed     Problem: Moderate/High Fall Risk Score >5  Goal: Patient will remain free of falls  Outcome: Completed     Problem: Safety  Goal: Patient will be free from injury during hospitalization  Outcome: Completed  Goal: Patient will be free from infection during hospitalization  Outcome: Completed     Problem: Pain  Goal: Pain at adequate level as identified by patient  Outcome: Completed     Problem: Discharge Barriers  Goal: Patient will be discharged home or other facility with appropriate resources  Outcome: Completed     Problem: Psychosocial and Spiritual Needs  Goal: Demonstrates ability to cope with hospitalization/illness  Outcome: Completed     Problem: Hemodynamic Status: Cardiac  Goal: Stable vital signs and fluid balance  Outcome: Completed     Problem: Inadequate Tissue Perfusion  Goal: Adequate tissue perfusion will be maintained  Outcome: Completed     Problem: Ineffective Gas Exchange  Goal: Effective breathing pattern  Outcome: Completed     Problem: Bladder/Voiding  Goal: Patient will experience proper bladder emptying during admission and remain free from infection  Outcome: Completed

## 2024-09-18 ENCOUNTER — Telehealth (INDEPENDENT_AMBULATORY_CARE_PROVIDER_SITE_OTHER): Payer: Self-pay

## 2024-09-18 LAB — ECG 12-LEAD
Atrial Rate: 96 {beats}/min
IHS MUSE NARRATIVE AND IMPRESSION: NORMAL
P Axis: 52 degrees
P-R Interval: 192 ms
Q-T Interval: 372 ms
QRS Duration: 96 ms
QTC Calculation (Bezet): 469 ms
R Axis: 52 degrees
T Axis: -70 degrees
Ventricular Rate: 96 {beats}/min

## 2024-09-18 NOTE — Telephone Encounter (Signed)
 Second attempt. Called twice. LVM. VM is in regards to schedule an appt with an APP within 1-2 weeks and a f/u with Dr. Verline in 6-8 weeks. Left cardiac connect number as well.

## 2024-09-18 NOTE — Telephone Encounter (Signed)
 Third attempt. Called twice, LVM. VM is in regards to schedule hospital f/u with an APP in 1-2 weeks and a f/u with Dr. Verline in 6-8 weeks. Left cardiac connect number as well.

## 2024-09-30 NOTE — Progress Notes (Signed)
 Name: Emily [name]      ### Patient Details  Date of Birth: Nov 05, 1945  MRN: [id number]      ### Encounter Details  Arrival Date: 09/15/2024 04:48 PM EST  Discharge Date: 09/17/2024 05:10 PM EST  Encounter ID: [id number] 17:10:00    ### Related interaction  [address] - TCM V2 Post Discharge Outreach (Post Discharge TCM V2 Outreach 1) ([email or web address])        ### Required Interventions and Feedback     Call Status         Call Status:     No Attempt (edited by TF on 09/30/2024 11:57 AM EST)    Comments::     Patient received an automated post-discharge outreach call. Incomplete (1st attempt), Incomplete (2nd attempt), Incomplete (3rd attempt). Unable to reach the patient, no further action/outreach needed at this time. (edited by TF on 09/30/2024 11:58 AM EST)    Verneita Leverne) F.  Care Coordinator, Ambulatory Care Management  [facility]   [address], [place], [place] [address]
